# Patient Record
Sex: Female | Born: 1950 | Race: Asian | Hispanic: No | Smoking: Former smoker
Health system: Southern US, Community
[De-identification: ages and names within clinical notes are randomized; demographics above are authoritative.]

## PROBLEM LIST (undated history)

## (undated) DIAGNOSIS — I214 Non-ST elevation (NSTEMI) myocardial infarction: Secondary | ICD-10-CM

## (undated) DIAGNOSIS — R112 Nausea with vomiting, unspecified: Secondary | ICD-10-CM

## (undated) DIAGNOSIS — I701 Atherosclerosis of renal artery: Secondary | ICD-10-CM

## (undated) DIAGNOSIS — I739 Peripheral vascular disease, unspecified: Secondary | ICD-10-CM

## (undated) DIAGNOSIS — J3089 Other allergic rhinitis: Secondary | ICD-10-CM

## (undated) DIAGNOSIS — E119 Type 2 diabetes mellitus without complications: Secondary | ICD-10-CM

## (undated) DIAGNOSIS — I10 Essential (primary) hypertension: Secondary | ICD-10-CM

## (undated) DIAGNOSIS — E785 Hyperlipidemia, unspecified: Secondary | ICD-10-CM

## (undated) DIAGNOSIS — I5032 Chronic diastolic (congestive) heart failure: Secondary | ICD-10-CM

## (undated) DIAGNOSIS — D72829 Elevated white blood cell count, unspecified: Secondary | ICD-10-CM

## (undated) DIAGNOSIS — I499 Cardiac arrhythmia, unspecified: Secondary | ICD-10-CM

## (undated) DIAGNOSIS — R0602 Shortness of breath: Secondary | ICD-10-CM

## (undated) DIAGNOSIS — I2510 Atherosclerotic heart disease of native coronary artery without angina pectoris: Secondary | ICD-10-CM

## (undated) DIAGNOSIS — Z9889 Other specified postprocedural states: Secondary | ICD-10-CM

## (undated) HISTORY — PX: TUMOR REMOVAL: SHX12

## (undated) HISTORY — PX: CARDIAC CATHETERIZATION: SHX172

## (undated) HISTORY — PX: OTHER SURGICAL HISTORY: SHX169

---

## 1999-10-16 ENCOUNTER — Ambulatory Visit: Admission: RE | Admit: 1999-10-16 | Discharge: 1999-10-16 | Payer: Self-pay | Admitting: Gynecology

## 1999-10-16 ENCOUNTER — Other Ambulatory Visit: Admission: RE | Admit: 1999-10-16 | Discharge: 1999-10-16 | Payer: Self-pay | Admitting: Gynecology

## 1999-10-25 ENCOUNTER — Ambulatory Visit: Admission: RE | Admit: 1999-10-25 | Discharge: 1999-10-25 | Payer: Self-pay | Admitting: Gynecology

## 1999-11-19 ENCOUNTER — Ambulatory Visit: Admission: RE | Admit: 1999-11-19 | Discharge: 1999-11-19 | Payer: Self-pay | Admitting: Gynecology

## 1999-11-26 ENCOUNTER — Inpatient Hospital Stay (HOSPITAL_COMMUNITY): Admission: RE | Admit: 1999-11-26 | Discharge: 1999-11-27 | Payer: Self-pay | Admitting: Gynecology

## 1999-12-17 ENCOUNTER — Ambulatory Visit: Admission: RE | Admit: 1999-12-17 | Discharge: 1999-12-17 | Payer: Self-pay | Admitting: Gynecology

## 2000-01-07 ENCOUNTER — Encounter: Payer: Self-pay | Admitting: Gynecology

## 2000-01-07 ENCOUNTER — Ambulatory Visit: Admission: RE | Admit: 2000-01-07 | Discharge: 2000-01-07 | Payer: Self-pay | Admitting: Gynecology

## 2001-01-06 ENCOUNTER — Other Ambulatory Visit: Admission: RE | Admit: 2001-01-06 | Discharge: 2001-01-06 | Payer: Self-pay | Admitting: Family Medicine

## 2001-06-09 ENCOUNTER — Ambulatory Visit (HOSPITAL_COMMUNITY): Admission: RE | Admit: 2001-06-09 | Discharge: 2001-06-09 | Payer: Self-pay | Admitting: Family Medicine

## 2001-06-09 ENCOUNTER — Encounter: Payer: Self-pay | Admitting: Family Medicine

## 2003-12-12 ENCOUNTER — Other Ambulatory Visit: Admission: RE | Admit: 2003-12-12 | Discharge: 2003-12-12 | Payer: Self-pay | Admitting: Family Medicine

## 2004-03-17 ENCOUNTER — Emergency Department (HOSPITAL_COMMUNITY): Admission: EM | Admit: 2004-03-17 | Discharge: 2004-03-17 | Payer: Self-pay | Admitting: Emergency Medicine

## 2005-10-17 ENCOUNTER — Ambulatory Visit (HOSPITAL_COMMUNITY): Admission: RE | Admit: 2005-10-17 | Discharge: 2005-10-17 | Payer: Self-pay | Admitting: Internal Medicine

## 2005-11-12 ENCOUNTER — Ambulatory Visit (HOSPITAL_COMMUNITY): Admission: RE | Admit: 2005-11-12 | Discharge: 2005-11-12 | Payer: Self-pay | Admitting: Internal Medicine

## 2005-11-14 ENCOUNTER — Ambulatory Visit (HOSPITAL_COMMUNITY): Admission: RE | Admit: 2005-11-14 | Discharge: 2005-11-14 | Payer: Self-pay | Admitting: Internal Medicine

## 2005-12-22 ENCOUNTER — Ambulatory Visit: Payer: Self-pay

## 2006-01-13 ENCOUNTER — Ambulatory Visit: Payer: Self-pay | Admitting: Cardiology

## 2006-06-17 ENCOUNTER — Ambulatory Visit (HOSPITAL_COMMUNITY): Admission: RE | Admit: 2006-06-17 | Discharge: 2006-06-17 | Payer: Self-pay | Admitting: Internal Medicine

## 2011-05-12 DIAGNOSIS — R079 Chest pain, unspecified: Secondary | ICD-10-CM

## 2011-05-12 DIAGNOSIS — R0602 Shortness of breath: Secondary | ICD-10-CM

## 2012-02-19 ENCOUNTER — Encounter: Payer: Self-pay | Admitting: Internal Medicine

## 2012-03-29 ENCOUNTER — Encounter: Payer: 59 | Admitting: Internal Medicine

## 2012-04-20 DIAGNOSIS — I509 Heart failure, unspecified: Secondary | ICD-10-CM

## 2012-04-20 DIAGNOSIS — I5031 Acute diastolic (congestive) heart failure: Secondary | ICD-10-CM

## 2012-04-20 DIAGNOSIS — I10 Essential (primary) hypertension: Secondary | ICD-10-CM

## 2012-04-20 DIAGNOSIS — J9600 Acute respiratory failure, unspecified whether with hypoxia or hypercapnia: Secondary | ICD-10-CM

## 2012-04-22 ENCOUNTER — Other Ambulatory Visit: Payer: Self-pay | Admitting: Physician Assistant

## 2012-04-22 ENCOUNTER — Encounter (HOSPITAL_COMMUNITY): Payer: Self-pay | Admitting: General Practice

## 2012-04-22 ENCOUNTER — Other Ambulatory Visit: Payer: Self-pay

## 2012-04-22 ENCOUNTER — Inpatient Hospital Stay (HOSPITAL_COMMUNITY)
Admission: AD | Admit: 2012-04-22 | Discharge: 2012-04-24 | DRG: 246 | Disposition: A | Discharge status: Home / Self Care | Payer: 59 | Source: Other Acute Inpatient Hospital | Attending: Cardiovascular Disease | Admitting: Cardiovascular Disease

## 2012-04-22 DIAGNOSIS — I252 Old myocardial infarction: Secondary | ICD-10-CM

## 2012-04-22 DIAGNOSIS — I5031 Acute diastolic (congestive) heart failure: Secondary | ICD-10-CM

## 2012-04-22 DIAGNOSIS — Z87891 Personal history of nicotine dependence: Secondary | ICD-10-CM

## 2012-04-22 DIAGNOSIS — I2510 Atherosclerotic heart disease of native coronary artery without angina pectoris: Secondary | ICD-10-CM | POA: Diagnosis present

## 2012-04-22 DIAGNOSIS — I214 Non-ST elevation (NSTEMI) myocardial infarction: Principal | ICD-10-CM

## 2012-04-22 DIAGNOSIS — I509 Heart failure, unspecified: Secondary | ICD-10-CM

## 2012-04-22 DIAGNOSIS — D72829 Elevated white blood cell count, unspecified: Secondary | ICD-10-CM | POA: Diagnosis present

## 2012-04-22 DIAGNOSIS — I739 Peripheral vascular disease, unspecified: Secondary | ICD-10-CM | POA: Diagnosis present

## 2012-04-22 DIAGNOSIS — R0989 Other specified symptoms and signs involving the circulatory and respiratory systems: Secondary | ICD-10-CM | POA: Diagnosis present

## 2012-04-22 DIAGNOSIS — Z794 Long term (current) use of insulin: Secondary | ICD-10-CM

## 2012-04-22 DIAGNOSIS — E1151 Type 2 diabetes mellitus with diabetic peripheral angiopathy without gangrene: Secondary | ICD-10-CM | POA: Diagnosis present

## 2012-04-22 DIAGNOSIS — E119 Type 2 diabetes mellitus without complications: Secondary | ICD-10-CM | POA: Diagnosis present

## 2012-04-22 DIAGNOSIS — I10 Essential (primary) hypertension: Secondary | ICD-10-CM | POA: Diagnosis present

## 2012-04-22 DIAGNOSIS — E1169 Type 2 diabetes mellitus with other specified complication: Secondary | ICD-10-CM | POA: Diagnosis present

## 2012-04-22 DIAGNOSIS — Z6838 Body mass index (BMI) 38.0-38.9, adult: Secondary | ICD-10-CM

## 2012-04-22 DIAGNOSIS — I152 Hypertension secondary to endocrine disorders: Secondary | ICD-10-CM | POA: Diagnosis present

## 2012-04-22 DIAGNOSIS — J9600 Acute respiratory failure, unspecified whether with hypoxia or hypercapnia: Secondary | ICD-10-CM | POA: Diagnosis present

## 2012-04-22 DIAGNOSIS — E782 Mixed hyperlipidemia: Secondary | ICD-10-CM | POA: Diagnosis present

## 2012-04-22 DIAGNOSIS — E785 Hyperlipidemia, unspecified: Secondary | ICD-10-CM | POA: Diagnosis present

## 2012-04-22 DIAGNOSIS — Z79899 Other long term (current) drug therapy: Secondary | ICD-10-CM

## 2012-04-22 HISTORY — DX: Hyperlipidemia, unspecified: E78.5

## 2012-04-22 HISTORY — DX: Type 2 diabetes mellitus without complications: E11.9

## 2012-04-22 HISTORY — DX: Elevated white blood cell count, unspecified: D72.829

## 2012-04-22 HISTORY — DX: Atherosclerotic heart disease of native coronary artery without angina pectoris: I25.10

## 2012-04-22 HISTORY — DX: Morbid (severe) obesity due to excess calories: E66.01

## 2012-04-22 HISTORY — DX: Shortness of breath: R06.02

## 2012-04-22 HISTORY — DX: Essential (primary) hypertension: I10

## 2012-04-22 HISTORY — DX: Chronic diastolic (congestive) heart failure: I50.32

## 2012-04-22 HISTORY — DX: Peripheral vascular disease, unspecified: I73.9

## 2012-04-22 LAB — CBC
MCH: 31.1 pg (ref 26.0–34.0)
MCHC: 34.2 g/dL (ref 30.0–36.0)
Platelets: 316 10*3/uL (ref 150–400)
RBC: 4.56 MIL/uL (ref 3.87–5.11)
RDW: 14.8 % (ref 11.5–15.5)

## 2012-04-22 LAB — CREATININE, SERUM: Creatinine, Ser: 0.6 mg/dL (ref 0.50–1.10)

## 2012-04-22 MED ORDER — NITROGLYCERIN 0.4 MG SL SUBL: 0.4000 mg | SUBLINGUAL_TABLET | SUBLINGUAL | Status: DC | PRN

## 2012-04-22 MED ORDER — ATORVASTATIN CALCIUM 40 MG PO TABS
40.0000 mg | ORAL_TABLET | Freq: Every day | ORAL | Status: DC
  Administered 2012-04-22 – 2012-04-23 (×2): 40 mg via ORAL
  Filled 2012-04-22 (×3): qty 1

## 2012-04-22 MED ORDER — FUROSEMIDE 10 MG/ML IJ SOLN
40.0000 mg | Freq: Every day | INTRAMUSCULAR | Status: DC
  Administered 2012-04-23: 40 mg via INTRAVENOUS
  Filled 2012-04-22 (×2): qty 4

## 2012-04-22 MED ORDER — SODIUM CHLORIDE 0.9 % IV SOLN: 250.0000 mL | INTRAVENOUS | Status: DC | PRN

## 2012-04-22 MED ORDER — SODIUM CHLORIDE 0.9 % IJ SOLN
3.0000 mL | Freq: Two times a day (BID) | INTRAMUSCULAR | Status: DC
  Administered 2012-04-22 – 2012-04-23 (×3): 3 mL via INTRAVENOUS

## 2012-04-22 MED ORDER — SODIUM CHLORIDE 0.9 % IJ SOLN
3.0000 mL | Freq: Two times a day (BID) | INTRAMUSCULAR | Status: DC
  Administered 2012-04-22: 3 mL via INTRAVENOUS

## 2012-04-22 MED ORDER — ASPIRIN EC 81 MG PO TBEC
81.0000 mg | DELAYED_RELEASE_TABLET | Freq: Every day | ORAL | Status: DC
  Administered 2012-04-23: 81 mg via ORAL
  Filled 2012-04-22: qty 1

## 2012-04-22 MED ORDER — MONTELUKAST SODIUM 10 MG PO TABS
10.0000 mg | ORAL_TABLET | Freq: Every day | ORAL | Status: DC
  Administered 2012-04-22: 10 mg via ORAL
  Filled 2012-04-22 (×3): qty 1

## 2012-04-22 MED ORDER — ONDANSETRON HCL 4 MG/2ML IJ SOLN: 4.0000 mg | Freq: Four times a day (QID) | INTRAMUSCULAR | Status: DC | PRN

## 2012-04-22 MED ORDER — INSULIN ASPART 100 UNIT/ML ~~LOC~~ SOLN
0.0000 [IU] | Freq: Three times a day (TID) | SUBCUTANEOUS | Status: DC
  Administered 2012-04-23: 8 [IU] via SUBCUTANEOUS
  Administered 2012-04-23: 2 [IU] via SUBCUTANEOUS
  Administered 2012-04-24: 03:00:00 3 [IU] via SUBCUTANEOUS

## 2012-04-22 MED ORDER — SODIUM CHLORIDE 0.9 % IJ SOLN
3.0000 mL | INTRAMUSCULAR | Status: DC | PRN
  Administered 2012-04-23: 3 mL via INTRAVENOUS

## 2012-04-22 MED ORDER — SODIUM CHLORIDE 0.9 % IJ SOLN: 3.0000 mL | INTRAMUSCULAR | Status: DC | PRN

## 2012-04-22 MED ORDER — METOPROLOL TARTRATE 50 MG PO TABS
50.0000 mg | ORAL_TABLET | Freq: Two times a day (BID) | ORAL | Status: DC
  Administered 2012-04-22 – 2012-04-24 (×4): 50 mg via ORAL
  Filled 2012-04-22 (×5): qty 1

## 2012-04-22 MED ORDER — LISINOPRIL 20 MG PO TABS
20.0000 mg | ORAL_TABLET | Freq: Two times a day (BID) | ORAL | Status: DC
  Administered 2012-04-22 – 2012-04-24 (×4): 20 mg via ORAL
  Filled 2012-04-22 (×5): qty 1

## 2012-04-22 MED ORDER — SODIUM CHLORIDE 0.9 % IV SOLN
INTRAVENOUS | Status: DC
  Administered 2012-04-23 (×2): via INTRAVENOUS

## 2012-04-22 MED ORDER — ASPIRIN 81 MG PO CHEW
324.0000 mg | CHEWABLE_TABLET | ORAL | Status: AC
  Administered 2012-04-23: 324 mg via ORAL
  Filled 2012-04-22: qty 4

## 2012-04-22 MED ORDER — LOSARTAN POTASSIUM 50 MG PO TABS
100.0000 mg | ORAL_TABLET | Freq: Every day | ORAL | Status: DC
  Administered 2012-04-22 – 2012-04-24 (×3): 100 mg via ORAL
  Filled 2012-04-22 (×3): qty 2

## 2012-04-22 MED ORDER — INSULIN ASPART 100 UNIT/ML ~~LOC~~ SOLN
0.0000 [IU] | Freq: Every day | SUBCUTANEOUS | Status: DC
  Administered 2012-04-22: 2 [IU] via SUBCUTANEOUS

## 2012-04-22 MED ORDER — INSULIN GLARGINE 100 UNIT/ML ~~LOC~~ SOLN
70.0000 [IU] | Freq: Two times a day (BID) | SUBCUTANEOUS | Status: DC
  Administered 2012-04-22 – 2012-04-24 (×4): 70 [IU] via SUBCUTANEOUS

## 2012-04-22 MED ORDER — ACETAMINOPHEN 325 MG PO TABS: 650.0000 mg | ORAL_TABLET | ORAL | Status: DC | PRN

## 2012-04-22 MED ORDER — ENOXAPARIN SODIUM 40 MG/0.4ML ~~LOC~~ SOLN
40.0000 mg | SUBCUTANEOUS | Status: DC
  Administered 2012-04-22: 40 mg via SUBCUTANEOUS
  Filled 2012-04-22 (×2): qty 0.4

## 2012-04-22 NOTE — Progress Notes (Signed)
Hypoglycemic Event  CBG: 65  Treatment: 15 GM carbohydrate snack  Symptoms: None  Follow-up CBG: Time:1629 CBG Result:1333  Possible Reasons for Event: Inadequate meal intake  Comments/MD notified:    Adline Mango  Remember to initiate Hypoglycemia Order Set & complete

## 2012-04-22 NOTE — H&P (Signed)
[facility]                                          [place], [place]  [address]     NAMELEIANNE, Leah Ferrell [facility] MEGAT            ROOM:          [id number]                UNIT NUMBER:  [id number]                        LOCATION:      31F         ADM/VISIT DATE:     04/20/12                ADM PHYS:      Timmie Foerster MD    ACCT: [id number]                               DOB:           05/29/2050   CARDIOLOGY CONSULTATION REPORT   CONSULTING CARDIOLOGIST:  Dr. Nona Dell.   REFERRING PHYSICIAN:  Dr. Florene Glen, [facility].   REASON FOR CONSULTATION:  Congestive heart failure, abnormal troponins.   HISTORY OF PRESENT ILLNESS:  [name] is a 62 year old female, with no documented history of coronary artery disease, but with multiple cardiac risk factors notable for IDDM, HTN, HLD, and age.  She underwent previous noninvasive evaluation with echocardiography and pharmacologic nuclear imaging study in 04/2011.  Echocardiogram indicated normal LVF (EF 50-55%), with no focal WMAs, normal RVF, and no significant valvular abnormalities.   The [facility], also reviewed by Dr. Andee Lineman, indicated normal perfusion with normal wall motion.   The patient presented to the emergency room early this morning, via EMS, with complaint of shortness of breath.  She presented with a blood pressure of 189/106, pulse 101, and was afebrile.  She was found to have evidence of mild pulmonary edema on chest x-ray and was treated with 40 mg of IV Lasix in the ED. Additionally, she was treated with nebulizer and antibiotics.   Admission EKG indicated normal sinus rhythm with no acute changes.  Initial set of cardiac markers is nondiagnostic with a troponin I of 0.06.  Followup levels are currently pending.   The patient presents with no antecedent history of exertional angina pectoris;  however, she does complain of exertional dyspnea.  She also suggests  a longstanding history of occasional palpitations, including an episode for which she was evaluated at [facility] ED, approximately 10 years ago. She suggests that she did not require hospitalization and remains unclear as to exactly what was her diagnosis.   ALLERGIES:  No known drug allergies.   HOME MEDICATIONS: 1.   Coated aspirin 325 mg daily. 2.   Caltrate 600 b.i.d. 3.   Cozaar 50 daily. 4.   Fish oil 1 gm daily. 5.   Lisinopril/HCTZ 20/12.5 b.i.d. 6.   Lopressor 50 b.i.d. 7.   Humulin 98 units t.i.d. before meals. 8.   Pravastatin 80 daily. 9.   Singulair 10 daily.   PAST MEDICAL HISTORY: 1.   HTN. 2.   Atrial D. 3.   IDDM. 4.   Palpitations. 5.   Morbid  obesity.   SOCIAL HISTORY:  The patient lives in [place], alone.  She has 4 children.  She is widowed.  She quit smoking tobacco 10 years ago.  Denies alcohol use.   FAMILY HISTORY:  Negative for premature CAD.   REVIEW OF SYSTEMS:  Denies history of MI, CHF, or stroke.  Otherwise as per HPI.  Remaining systems reviewed and are negative.   PHYSICAL EXAMINATION:  Blood pressure currently 140/74, pulse 90's regular, respirations 22, temperature afebrile, saturations 97% on 3 liters.  Weight 209 pounds.  General:  A 62 year old female, morbidly obese, sitting upright, in no distress.  HEENT:  Normocephalic, atraumatic.  PERRLA.  EOMI.  Neck:  Palpable bilateral carotid pulses with left-sided bruit/supraclavicular bruit.  Unable to discern JVD, secondary to neck girth.  Lungs: Clear to auscultation bilaterally.  Heart:  Regular rate and rhythm with a soft grade 2/6 systolic ejection murmur radiating to the aortic region.  No diastolic blow.  No rubs.  Abdomen:  Protuberant, soft.  Extremities:  Palpable bilateral femoral pulses, bilateral bruits (high pitched on left).  No significant peripheral edema.  Nonpalpable peripheral pulses.  Skin:  Warm and dry.  Musculoskeletal:  No obvious deformity.  Neuro:   Alert and oriented.   ADMISSION CHEST X-RAY:  Borderline cardiomegaly; vascular congestion consistent with mild pulmonary edema.   ADMISSION EKG:  NSR, 96 bpm; nonspecific ST changes.   LABORATORY DATA:  A pH 7.38, pCO2 49, pCO2 48 and bicarb 29 (room air).  D-dimer 0.48, BNP 36.  CPK 146/3.4; troponin I 0.06 and 0.12, third set pending.  Sodium 137, potassium 3.5, BUN 13, creatinine 0.7, glucose 278.  WBC 12,500, hemoglobin 13.9, hematocrit 41, and platelets 270,000.   IMPRESSION: 1.   Status post hypoxic respiratory failure. 2.   Acute diastolic heart failure, mild. A.  Normal Lexiscan Cardiolite, 04/2011. B.  Normal left ventricular function, echocardiogram, 04/2011. 3.   Hypertensive urgency. 4.   Insulin-dependent diabetes mellitus. 5.   Hypertension. 6.   Atrial D. 7.   Peripheral arterial disease, presumed. A.  Bilateral femoral bruits. 8.   Leukocytosis, afebrile. A.  Chronically elevated.   PLAN: 1.   Will follow up on echocardiogram for reassessment of LV function, rule out      underlying structural abnormalities.  If the study indicates a new wall      motion abnormality, or a decrease in EF, then we would strongly recommend      more aggressive evaluation with cardiac catheterization.  Otherwise, the      patient will require repeat stress test for risk stratification, given her      presentation with new onset heart failure and mildly abnormal troponins. 2.   Continue current diuretic regimen with close monitoring of volume status      with strict I/Os and daily weights.  Followup labs in a.m. for close      monitoring of renal function. 3.   Will assess lipid status with FLP profile in a.m. 4.   Continue cycling cardiac markers.  Followup EKG in a.m. 5.   Will order carotid Dopplers and lower extremity arterial Dopplers to rule      out significant carotid and peripheral arterial disease. 6.   We will also initiate intravenous heparin anticoagulation, per  pharmacy      protocol, given her presentation with abnormal troponins.   Patient was seen and examined in conjunction with Dr Nona Dell.  ______________________________                                             Prescott Parma, PA-C                                                                                                      ______________________________                                             Nona Dell, MD, [facility]   Attending note:  Patient seen and examined initially with Mr. Shara Blazing, history outlined above. Followup noninvasive testing reveals mild carotid atherosclerosis, markedly diminished ABIs bilaterally consistent with severe PAD (0.49 on the right and 0.39 the left), LVEF greater than 65% with diastolic dysfunction, moderate LAE with mild mitral regurgitation, atherosclerosis noted within the proximal ascending aorta. Cardiac markers minimally abnormal with peak troponin I 0.12, still concern for at least a type II NSTEMI in the setting of acute diastolic heart failure and hypertension. She has improved clinically with diuresis and medical therapy. After discussion, plan at this point is transfer to [facility] in anticipation of a diagnostic cardiac catheterization as well as peripheral angiography. Despite her normal Myoview from last March, she has clear evidence of diffuse atherosclerosis, and coronary anatomy needs to be clarified in light of her recent clinical decline. Risks and benefits discussed with her. She is in agreement to proceed.  Jonelle Sidle, M.D., F.A.C.C.

## 2012-04-23 ENCOUNTER — Other Ambulatory Visit: Payer: Self-pay

## 2012-04-23 ENCOUNTER — Encounter (HOSPITAL_COMMUNITY)
Admission: AD | Disposition: A | Discharge status: Home / Self Care | Payer: Self-pay | Source: Other Acute Inpatient Hospital | Attending: Cardiovascular Disease

## 2012-04-23 ENCOUNTER — Ambulatory Visit (HOSPITAL_COMMUNITY): Admission: RE | Admit: 2012-04-23 | Payer: 59 | Source: Ambulatory Visit | Admitting: Cardiology

## 2012-04-23 ENCOUNTER — Encounter (HOSPITAL_COMMUNITY): Payer: Self-pay | Admitting: Nurse Practitioner

## 2012-04-23 DIAGNOSIS — E1151 Type 2 diabetes mellitus with diabetic peripheral angiopathy without gangrene: Secondary | ICD-10-CM | POA: Diagnosis present

## 2012-04-23 DIAGNOSIS — I152 Hypertension secondary to endocrine disorders: Secondary | ICD-10-CM | POA: Diagnosis present

## 2012-04-23 DIAGNOSIS — E1159 Type 2 diabetes mellitus with other circulatory complications: Secondary | ICD-10-CM | POA: Diagnosis present

## 2012-04-23 DIAGNOSIS — I10 Essential (primary) hypertension: Secondary | ICD-10-CM | POA: Diagnosis present

## 2012-04-23 DIAGNOSIS — I5031 Acute diastolic (congestive) heart failure: Secondary | ICD-10-CM

## 2012-04-23 DIAGNOSIS — E782 Mixed hyperlipidemia: Secondary | ICD-10-CM | POA: Diagnosis present

## 2012-04-23 DIAGNOSIS — I2510 Atherosclerotic heart disease of native coronary artery without angina pectoris: Secondary | ICD-10-CM

## 2012-04-23 DIAGNOSIS — E785 Hyperlipidemia, unspecified: Secondary | ICD-10-CM | POA: Diagnosis present

## 2012-04-23 DIAGNOSIS — E1169 Type 2 diabetes mellitus with other specified complication: Secondary | ICD-10-CM | POA: Diagnosis present

## 2012-04-23 DIAGNOSIS — I252 Old myocardial infarction: Secondary | ICD-10-CM

## 2012-04-23 HISTORY — PX: LEFT HEART CATHETERIZATION WITH CORONARY ANGIOGRAM: SHX5451

## 2012-04-23 LAB — URINALYSIS, ROUTINE W REFLEX MICROSCOPIC
Glucose, UA: NEGATIVE mg/dL
Hgb urine dipstick: NEGATIVE
Specific Gravity, Urine: 1.008 (ref 1.005–1.030)
Urobilinogen, UA: 0.2 mg/dL (ref 0.0–1.0)
pH: 7 (ref 5.0–8.0)

## 2012-04-23 LAB — BASIC METABOLIC PANEL
BUN: 14 mg/dL (ref 6–23)
Chloride: 98 mEq/L (ref 96–112)
Glucose, Bld: 196 mg/dL — ABNORMAL HIGH (ref 70–99)
Potassium: 3.7 mEq/L (ref 3.5–5.1)

## 2012-04-23 LAB — GLUCOSE, CAPILLARY
Glucose-Capillary: 169 mg/dL — ABNORMAL HIGH (ref 70–99)
Glucose-Capillary: 232 mg/dL — ABNORMAL HIGH (ref 70–99)

## 2012-04-23 SURGERY — LEFT HEART CATHETERIZATION WITH CORONARY ANGIOGRAM
Anesthesia: LOCAL

## 2012-04-23 MED ORDER — LIDOCAINE HCL (PF) 1 % IJ SOLN
INTRAMUSCULAR | Status: AC
  Filled 2012-04-23: qty 30

## 2012-04-23 MED ORDER — AMLODIPINE BESYLATE 5 MG PO TABS
5.0000 mg | ORAL_TABLET | Freq: Every day | ORAL | Status: DC
  Administered 2012-04-23 – 2012-04-24 (×2): 5 mg via ORAL
  Filled 2012-04-23 (×3): qty 1

## 2012-04-23 MED ORDER — FENTANYL CITRATE 0.05 MG/ML IJ SOLN
INTRAMUSCULAR | Status: AC
  Filled 2012-04-23: qty 2

## 2012-04-23 MED ORDER — TICAGRELOR 90 MG PO TABS
ORAL_TABLET | ORAL | Status: AC
  Filled 2012-04-23: qty 2

## 2012-04-23 MED ORDER — TICAGRELOR 90 MG PO TABS
90.0000 mg | ORAL_TABLET | Freq: Two times a day (BID) | ORAL | Status: DC
  Administered 2012-04-23 – 2012-04-24 (×2): 90 mg via ORAL
  Filled 2012-04-23 (×4): qty 1

## 2012-04-23 MED ORDER — BIVALIRUDIN 250 MG IV SOLR
INTRAVENOUS | Status: AC
  Filled 2012-04-23: qty 250

## 2012-04-23 MED ORDER — HEPARIN SODIUM (PORCINE) 1000 UNIT/ML IJ SOLN
INTRAMUSCULAR | Status: AC
  Filled 2012-04-23: qty 1

## 2012-04-23 MED ORDER — SODIUM CHLORIDE 0.9 % IV SOLN
1.0000 mL/kg/h | INTRAVENOUS | Status: AC
  Administered 2012-04-23: 14:00:00 1 mL/kg/h via INTRAVENOUS

## 2012-04-23 MED ORDER — ASPIRIN 81 MG PO CHEW
81.0000 mg | CHEWABLE_TABLET | Freq: Every day | ORAL | Status: DC
  Administered 2012-04-24: 09:00:00 81 mg via ORAL
  Filled 2012-04-23: qty 1

## 2012-04-23 MED ORDER — FUROSEMIDE 40 MG PO TABS
40.0000 mg | ORAL_TABLET | Freq: Every day | ORAL | Status: DC
  Filled 2012-04-23 (×2): qty 1

## 2012-04-23 MED ORDER — VERAPAMIL HCL 2.5 MG/ML IV SOLN
INTRAVENOUS | Status: AC
  Filled 2012-04-23: qty 2

## 2012-04-23 MED ORDER — HEPARIN (PORCINE) IN NACL 2-0.9 UNIT/ML-% IJ SOLN
INTRAMUSCULAR | Status: AC
  Filled 2012-04-23: qty 1000

## 2012-04-23 MED ORDER — MIDAZOLAM HCL 2 MG/2ML IJ SOLN
INTRAMUSCULAR | Status: AC
  Filled 2012-04-23: qty 2

## 2012-04-23 NOTE — Progress Notes (Signed)
Pt visibly upset after talking to daughter on phone.  States "don't feel well", describes dizzy or fuzzy feeling in head. Tele SR 90's.  Moves all extremities strong and equal, pupils pearl, speech clear and appropriate.  Back to bed, po BP meds lisinopril and metoprolol given early.  Emotional support given.  Will monitor closely, call light in reach, bed exit alarm on.

## 2012-04-23 NOTE — H&P (View-Only) (Signed)
Patient Name: Leah Ferrell Date of Encounter: 04/23/2012   Principal Problem:   Acute diastolic CHF (congestive heart failure) Active Problems:   NSTEMI (non-ST elevated myocardial infarction)   Hypertension   Diabetes mellitus without complication   Hyperlipidemia   SUBJECTIVE  Slept well.  No chest pain or sob.  Able to lie flat.  CURRENT MEDS . aspirin EC  81 mg Oral Daily  . atorvastatin  40 mg Oral q1800  . enoxaparin (LOVENOX) injection  40 mg Subcutaneous Q24H  . furosemide  40 mg Intravenous Daily  . insulin aspart  0-15 Units Subcutaneous TID WC  . insulin aspart  0-5 Units Subcutaneous QHS  . insulin glargine  70 Units Subcutaneous BID  . lisinopril  20 mg Oral BID  . losartan  100 mg Oral Daily  . metoprolol tartrate  50 mg Oral BID  . montelukast  10 mg Oral QHS  . sodium chloride  3 mL Intravenous Q12H  . sodium chloride  3 mL Intravenous Q12H   OBJECTIVE  Filed Vitals:   04/22/12 1506 04/22/12 1828 04/22/12 2216 04/23/12 0551  BP: 170/80 150/52 173/76 147/52  Pulse: 75 83 86 79  Temp: 99.4 F (37.4 C)   97.9 F (36.6 C)  TempSrc: Oral   Oral  Resp: 18   18  Height: 5\' 2"  (1.575 m)     Weight: 210 lb 1.6 oz (95.3 kg)   212 lb 11.9 oz (96.5 kg)  SpO2: 96%   98%    Intake/Output Summary (Last 24 hours) at 04/23/12 0802 Last data filed at 04/23/12 0700  Gross per 24 hour  Intake  486.5 ml  Output      0 ml  Net  486.5 ml   Filed Weights   04/22/12 1506 04/23/12 0551  Weight: 210 lb 1.6 oz (95.3 kg) 212 lb 11.9 oz (96.5 kg)   PHYSICAL EXAM  General: Pleasant, NAD. Neuro: Alert and oriented X 3. Moves all extremities spontaneously. Psych: Normal affect. HEENT:  Normal  Neck: Supple without bruits.  JVP difficult to assess 2/2 girth. Lungs:  Resp regular and unlabored, CTA. Heart: RRR no s3, s4, or murmurs. Abdomen: Soft, non-tender, non-distended, BS + x 4.  Extremities: No clubbing, cyanosis.  Trace bilat LE edema. DP/PT/Radials 1+ and  equal bilaterally.  Accessory Clinical Findings  CBC  Recent Labs  04/22/12 1615  WBC 17.2*  HGB 14.2  HCT 41.5  MCV 91.0  PLT 316   Basic Metabolic Panel  Recent Labs  04/22/12 1615 04/23/12 0620  NA  --  137  K  --  3.7  CL  --  98  CO2  --  30  GLUCOSE  --  196*  BUN  --  14  CREATININE 0.60 0.59  CALCIUM  --  9.6   TELE  rsr  ECG  Rsr, 70, no acute st/t changes.  Radiology/Studies  No results found.  ASSESSMENT AND PLAN  1.  Acute diastolic chf:  Echo @ [facility] showed EF > 65%.  Weight @ [facility] yesterday was 215.  212 on our scale this AM with net + I/O yesterday.  She feels well and is able to lie flat.  Lungs are clear.  BP remains poorly controlled.  Slow IVF (contrast prophylaxix), add amlodipine, Cont current doses of bb/acei/arb.  K and Creat stable despite use of both acei and arb.  Cont current dose of IV lasix with plan to eval filling pressures at time of cath.  2.  Type II NSTEMI:  Mild troponin leak in setting of #1.  Cath today.  3.  PVD:  ABI @ [facility] showed - R: 0.49, L 0.39.  For peripheral runoff today.  4.  Leukocytosis:  According to cardiology consult note on 3/4, this is somewhat chronic, though higher today than it had been.  She did have a low grade fever yesterday afternoon.  Afebrile this AM.  Check UA.  Lungs clear.  5. HTN:  As above, poorly controlled.  Add amlodipine.  6.  DM:  Cont lantus and ssi.  Signed, Nicolasa Ducking NP Patient seen and examined and history reviewed. Agree with above findings and plan. Patient at high risk for CAD. Will proceed with cardiac cath today +/- PCI. Once cleared from cardiac standpoint will need PV evaluation. The procedure and risks were reviewed including but not limited to death, myocardial infarction, stroke, arrythmias, bleeding, transfusion, emergency surgery, dye allergy, or renal dysfunction. The patient voices understanding and is agreeable to proceed.   Theron Arista [facility] 04/23/2012  9:33 AM

## 2012-04-23 NOTE — Progress Notes (Signed)
Pt resting quietly with eyes closed, awakens easily, denies complaints.  BP 166/50.

## 2012-04-23 NOTE — Progress Notes (Signed)
TR BAND REMOVAL  LOCATION:  right radial  DEFLATED PER PROTOCOL:  yes  TIME BAND OFF / DRESSING APPLIED:   1630   SITE UPON ARRIVAL:   Level 0  SITE AFTER BAND REMOVAL:  Level 0  REVERSE ALLEN'S TEST:    positive  CIRCULATION SENSATION AND MOVEMENT:  Within Normal Limits  yes  COMMENTS:    

## 2012-04-23 NOTE — Progress Notes (Signed)
Utilization Review Completed Camellia J. Wood, RN, BSN, NCM [phone]  

## 2012-04-23 NOTE — Interval H&P Note (Signed)
History and Physical Interval Note:  04/23/2012 11:27 AM  Leah Ferrell  has presented today for surgery, with the diagnosis of cp  The various methods of treatment have been discussed with the patient and family. After consideration of risks, benefits and other options for treatment, the patient has consented to  Procedure(s): LEFT HEART CATHETERIZATION WITH CORONARY ANGIOGRAM (N/A) as a surgical intervention .  The patient's history has been reviewed, patient examined, no change in status, stable for surgery.  I have reviewed the patient's chart and labs.  Questions were answered to the patient's satisfaction.     Leah Ferrell 04/23/2012 11:27 AM

## 2012-04-23 NOTE — Progress Notes (Signed)
 Patient Name: Leah Ferrell Date of Encounter: 04/23/2012   Principal Problem:   Acute diastolic CHF (congestive heart failure) Active Problems:   NSTEMI (non-ST elevated myocardial infarction)   Hypertension   Diabetes mellitus without complication   Hyperlipidemia   SUBJECTIVE  Slept well.  No chest pain or sob.  Able to lie flat.  CURRENT MEDS . aspirin EC  81 mg Oral Daily  . atorvastatin  40 mg Oral q1800  . enoxaparin (LOVENOX) injection  40 mg Subcutaneous Q24H  . furosemide  40 mg Intravenous Daily  . insulin aspart  0-15 Units Subcutaneous TID WC  . insulin aspart  0-5 Units Subcutaneous QHS  . insulin glargine  70 Units Subcutaneous BID  . lisinopril  20 mg Oral BID  . losartan  100 mg Oral Daily  . metoprolol tartrate  50 mg Oral BID  . montelukast  10 mg Oral QHS  . sodium chloride  3 mL Intravenous Q12H  . sodium chloride  3 mL Intravenous Q12H   OBJECTIVE  Filed Vitals:   04/22/12 1506 04/22/12 1828 04/22/12 2216 04/23/12 0551  BP: 170/80 150/52 173/76 147/52  Pulse: 75 83 86 79  Temp: 99.4 F (37.4 C)   97.9 F (36.6 C)  TempSrc: Oral   Oral  Resp: 18   18  Height: 5' 2" (1.575 m)     Weight: 210 lb 1.6 oz (95.3 kg)   212 lb 11.9 oz (96.5 kg)  SpO2: 96%   98%    Intake/Output Summary (Last 24 hours) at 04/23/12 0802 Last data filed at 04/23/12 0700  Gross per 24 hour  Intake  486.5 ml  Output      0 ml  Net  486.5 ml   Filed Weights   04/22/12 1506 04/23/12 0551  Weight: 210 lb 1.6 oz (95.3 kg) 212 lb 11.9 oz (96.5 kg)   PHYSICAL EXAM  General: Pleasant, NAD. Neuro: Alert and oriented X 3. Moves all extremities spontaneously. Psych: Normal affect. HEENT:  Normal  Neck: Supple without bruits.  JVP difficult to assess 2/2 girth. Lungs:  Resp regular and unlabored, CTA. Heart: RRR no s3, s4, or murmurs. Abdomen: Soft, non-tender, non-distended, BS + x 4.  Extremities: No clubbing, cyanosis.  Trace bilat LE edema. DP/PT/Radials 1+ and  equal bilaterally.  Accessory Clinical Findings  CBC  Recent Labs  04/22/12 1615  WBC 17.2*  HGB 14.2  HCT 41.5  MCV 91.0  PLT 316   Basic Metabolic Panel  Recent Labs  04/22/12 1615 04/23/12 0620  NA  --  137  K  --  3.7  CL  --  98  CO2  --  30  GLUCOSE  --  196*  BUN  --  14  CREATININE 0.60 0.59  CALCIUM  --  9.6   TELE  rsr  ECG  Rsr, 70, no acute st/t changes.  Radiology/Studies  No results found.  ASSESSMENT AND PLAN  1.  Acute diastolic chf:  Echo @ [facility] showed EF > 65%.  Weight @ [facility] yesterday was 215.  212 on our scale this AM with net + I/O yesterday.  She feels well and is able to lie flat.  Lungs are clear.  BP remains poorly controlled.  Slow IVF (contrast prophylaxix), add amlodipine, Cont current doses of bb/acei/arb.  K and Creat stable despite use of both acei and arb.  Cont current dose of IV lasix with plan to eval filling pressures at time of cath.    2.  Type II NSTEMI:  Mild troponin leak in setting of #1.  Cath today.  3.  PVD:  ABI @ [facility] showed - R: 0.49, L 0.39.  For peripheral runoff today.  4.  Leukocytosis:  According to cardiology consult note on 3/4, this is somewhat chronic, though higher today than it had been.  She did have a low grade fever yesterday afternoon.  Afebrile this AM.  Check UA.  Lungs clear.  5. HTN:  As above, poorly controlled.  Add amlodipine.  6.  DM:  Cont lantus and ssi.  Signed, Christopher Berge NP Patient seen and examined and history reviewed. Agree with above findings and plan. Patient at high risk for CAD. Will proceed with cardiac cath today +/- PCI. Once cleared from cardiac standpoint will need PV evaluation. The procedure and risks were reviewed including but not limited to death, myocardial infarction, stroke, arrythmias, bleeding, transfusion, emergency surgery, dye allergy, or renal dysfunction. The patient voices understanding and is agreeable to proceed.   Peter JordanMD,FACC 04/23/2012  9:33 AM    

## 2012-04-23 NOTE — CV Procedure (Signed)
   Cardiac Catheterization Procedure Note  Name: Leah Ferrell MRN: [id number] DOB: [birth year]  Procedure: Left Heart Cath, Selective Coronary Angiography, LV angiography, PTCA and stenting of the LAD  Indication: 62 yo Female with multiple cardiac risk factors and PAD presents with unstable angina class IV.  Procedural Details:  The right wrist was prepped, draped, and anesthetized with 1% lidocaine. Using the modified Seldinger technique, a 5 French sheath was introduced into the right radial artery. 3 mg of verapamil was administered through the sheath, weight-based unfractionated heparin was administered intravenously. Standard Judkins catheters were used for selective coronary angiography and left ventriculography. Catheter exchanges were performed over an exchange length guidewire.  PROCEDURAL FINDINGS Hemodynamics: AO 155/58 mean 91 mm Hg LV 163/13 mm Hg   Coronary angiography: Coronary dominance: right  Left mainstem: Short with shared coronary ostia for the LAD and LCX.  Left anterior descending (LAD): diffuse 30-40% proximal. 90% focal lesion after the first diagonal.   Left circumflex (LCx): <20% proximal and mid. 80% very small distal OM.  Right coronary artery (RCA): Diffuse 20% proximal and mid vessel disease. Moderate calcification.  Left ventriculography: Left ventricular systolic function is normal, LVEF is estimated at 55-65%, there is no significant mitral regurgitation   PCI Note:  Following the diagnostic procedure, the decision was made to proceed with PCI. The radial sheath was upsized to a 6 [place]. Bilinta 180 mg was given orally. Weight-based bivalirudin was given for anticoagulation. Once a therapeutic ACT was achieved, a 6 [place] XB 3.0 guide catheter was inserted.  A prowater coronary guidewire was used to cross the lesion.  The lesion was predilated with a 2.5 mm balloon.  The lesion was then stented with a 3.0x 16 mm Promus premier stent.  The stent was  postdilated with a 3.25 mm noncompliant balloon.  Following PCI, there was 0% residual stenosis and TIMI-3 flow. Final angiography confirmed an excellent result. The patient tolerated the procedure well. There were no immediate procedural complications. A TR band was used for radial hemostasis. The patient was transferred to the post catheterization recovery area for further monitoring.  PCI Data: Vessel - LAD/Segment - mid Percent Stenosis (pre)  90% TIMI-flow 3 Stent 3.0 x 16 mm Promus premier Percent Stenosis (post) 0% TIMI-flow (post) 3  Final Conclusions:   1. Single vessel obstructive CAD 2. Normal LV function. 3. Successful stenting of the mid LAD with a DES.   Recommendations:  Dual antiplatelet therapy for one year.  Theron Arista [facility] 04/23/2012, 12:21 PM

## 2012-04-24 ENCOUNTER — Other Ambulatory Visit: Payer: Self-pay

## 2012-04-24 ENCOUNTER — Other Ambulatory Visit: Payer: Self-pay | Admitting: Physician Assistant

## 2012-04-24 ENCOUNTER — Encounter (HOSPITAL_COMMUNITY): Payer: Self-pay | Admitting: Physician Assistant

## 2012-04-24 DIAGNOSIS — I214 Non-ST elevation (NSTEMI) myocardial infarction: Secondary | ICD-10-CM

## 2012-04-24 DIAGNOSIS — I5031 Acute diastolic (congestive) heart failure: Secondary | ICD-10-CM

## 2012-04-24 DIAGNOSIS — I509 Heart failure, unspecified: Secondary | ICD-10-CM

## 2012-04-24 LAB — BASIC METABOLIC PANEL
BUN: 11 mg/dL (ref 6–23)
Chloride: 99 mEq/L (ref 96–112)
Creatinine, Ser: 0.6 mg/dL (ref 0.50–1.10)
Glucose, Bld: 195 mg/dL — ABNORMAL HIGH (ref 70–99)
Potassium: 3.5 mEq/L (ref 3.5–5.1)

## 2012-04-24 LAB — CBC
HCT: 39.8 % (ref 36.0–46.0)
Hemoglobin: 13.6 g/dL (ref 12.0–15.0)
MCHC: 34.2 g/dL (ref 30.0–36.0)
MCV: 91.9 fL (ref 78.0–100.0)
WBC: 14.2 10*3/uL — ABNORMAL HIGH (ref 4.0–10.5)

## 2012-04-24 LAB — GLUCOSE, CAPILLARY: Glucose-Capillary: 155 mg/dL — ABNORMAL HIGH (ref 70–99)

## 2012-04-24 MED ORDER — FUROSEMIDE 20 MG PO TABS: 20.0000 mg | ORAL_TABLET | Freq: Every day | ORAL | Status: DC

## 2012-04-24 MED ORDER — LOSARTAN POTASSIUM 100 MG PO TABS: 100.0000 mg | ORAL_TABLET | Freq: Every day | ORAL | Status: DC

## 2012-04-24 MED ORDER — LOPERAMIDE HCL 2 MG PO CAPS
4.0000 mg | ORAL_CAPSULE | ORAL | Status: DC | PRN
  Administered 2012-04-24: 05:00:00 4 mg via ORAL
  Filled 2012-04-24: qty 2

## 2012-04-24 MED ORDER — FUROSEMIDE 20 MG PO TABS
20.0000 mg | ORAL_TABLET | Freq: Every day | ORAL | Status: DC
  Administered 2012-04-24: 20 mg via ORAL
  Filled 2012-04-24: qty 1

## 2012-04-24 MED ORDER — LISINOPRIL 20 MG PO TABS: 20.0000 mg | ORAL_TABLET | Freq: Two times a day (BID) | ORAL | Status: DC

## 2012-04-24 MED ORDER — ATORVASTATIN CALCIUM 40 MG PO TABS: 40.0000 mg | ORAL_TABLET | Freq: Every day | ORAL | Status: DC

## 2012-04-24 MED ORDER — TICAGRELOR 90 MG PO TABS: 90.0000 mg | ORAL_TABLET | Freq: Two times a day (BID) | ORAL | Status: DC

## 2012-04-24 MED ORDER — AMLODIPINE BESYLATE 5 MG PO TABS: 5.0000 mg | ORAL_TABLET | Freq: Every day | ORAL | Status: DC

## 2012-04-24 MED ORDER — ASPIRIN 81 MG PO TBEC: 81.0000 mg | DELAYED_RELEASE_TABLET | Freq: Every day | ORAL | Status: DC

## 2012-04-24 MED ORDER — NITROGLYCERIN 0.4 MG SL SUBL: 0.4000 mg | SUBLINGUAL_TABLET | SUBLINGUAL | Status: DC | PRN

## 2012-04-24 NOTE — Progress Notes (Signed)
   Patient Name: Leah Ferrell Date of Encounter: 04/24/2012   Principal Problem:   Acute diastolic CHF (congestive heart failure) Active Problems:   Hypertension   Diabetes mellitus without complication   Hyperlipidemia   NSTEMI (non-ST elevated myocardial infarction)   SUBJECTIVE  No chest pain or sob.   CURRENT MEDS . amLODipine  5 mg Oral Daily  . aspirin  81 mg Oral Daily  . atorvastatin  40 mg Oral q1800  . furosemide  40 mg Oral Daily  . insulin aspart  0-15 Units Subcutaneous TID WC  . insulin aspart  0-5 Units Subcutaneous QHS  . insulin glargine  70 Units Subcutaneous BID  . lisinopril  20 mg Oral BID  . losartan  100 mg Oral Daily  . metoprolol tartrate  50 mg Oral BID  . montelukast  10 mg Oral QHS  . Ticagrelor  90 mg Oral BID   OBJECTIVE  Filed Vitals:   04/24/12 0400 04/24/12 0500 04/24/12 0509 04/24/12 0700  BP: 170/44 186/49 152/51 164/54  Pulse: 70   70  Temp: 97.6 F (36.4 C)   98.2 F (36.8 C)  TempSrc: Oral   Oral  Resp:      Height:      Weight: 213 lb 3 oz (96.7 kg)     SpO2: 96%   96%    Intake/Output Summary (Last 24 hours) at 04/24/12 0825 Last data filed at 04/24/12 0700  Gross per 24 hour  Intake 2301.03 ml  Output   2325 ml  Net -23.97 ml   Filed Weights   04/22/12 1506 04/23/12 0551 04/24/12 0400  Weight: 210 lb 1.6 oz (95.3 kg) 212 lb 11.9 oz (96.5 kg) 213 lb 3 oz (96.7 kg)   PHYSICAL EXAM  General: Pleasant, NAD. Neuro: Alert and oriented X 3. Moves all extremities spontaneously. HEENT:  Normal  Neck: Supple  Lungs:  Resp regular and unlabored, CTA. Heart: RRR no s3, s4, or murmurs. Abdomen: Soft, non-tender, non-distended Extremities: No edema. Cath site with no hematoma  Accessory Clinical Findings  CBC  Recent Labs  04/22/12 1615 04/24/12 0645  WBC 17.2* 14.2*  HGB 14.2 13.6  HCT 41.5 39.8  MCV 91.0 91.9  PLT 316 279   Basic Metabolic Panel  Recent Labs  04/23/12 0620 04/24/12 0645  NA 137 137    K 3.7 3.5  CL 98 99  CO2 30 31  GLUCOSE 196* 195*  BUN 14 11  CREATININE 0.59 0.60  CALCIUM 9.6 9.6     ECG  Rsr, 70, no acute st/t changes.    ASSESSMENT AND PLAN  1.  Acute diastolic chf:  Echo @ [facility] showed EF > 65%.  DC on lasix 20 mg po daily. BMET one week.  2.  Type II NSTEMI:  Mild troponin leak in setting of #1.  S/P PCI of LAD; continue ASA and plavix. Continue lipitor.  3.  PVD:  ABI @ [facility] showed - R: 0.49, L 0.39.  Arrange outpatient eval with Dr Kirke Corin.  4.  Leukocytosis:  According to cardiology consult note on 3/4, this is somewhat chronic.  Fu with her primary care as outpt.  5. HTN:  Continue present meds at DC and adjust as outpt.  6.  DM:  Cont lantus and ssi. > 30 min PA and physician time Maretta Los [facility] 04/24/2012 8:25 AM

## 2012-04-24 NOTE — Discharge Summary (Signed)
See progress notes Leah Ferrell  

## 2012-04-24 NOTE — Progress Notes (Signed)
Dozing quietly, awakens easily.  Pt reports 4 loose bm's in past 24 hrs.  Immodium given per cardiology prn orders.  BP 152/51.

## 2012-04-24 NOTE — Progress Notes (Signed)
CARDIAC REHAB PHASE I   PRE:  Rate/Rhythm: Sr 86  BP:  Supine:   Sitting: 203/51 No am bp meds yet Standing:    SaO2: RA 98  MODE:  Ambulation: 200 ft   POST:  Rate/Rhythm: Sr 90  BP:  Supine:   Sitting: 187/51  Standing:    SaO2: RA 97  Ambulated pt x 2 200 feet pt with several standing and one sitting rest break due to leg fatigue and discomfort bilat.  Pt reports this is chronic.  Pain subsides with rest.  Pt back to room for education in anticipation of discharge today.  Reviewed heart healthy diet with diabetes modifications, exercise guidelines as tolerated, ntg protocol with alert 911 for unrelieved chest pain. Pt declined referral to outpatient cardiac rehab due to bilat leg claudication. Pt is agreeable to rehab once her legs are treated and exercise is tolerable. [id number]  Arna Medici

## 2012-04-24 NOTE — Discharge Summary (Signed)
Discharge Summary   Patient ID: Leah Ferrell MRN: [id number], DOB/AGE: Dec 04, 1950 62 y.o. Admit date: 04/22/2012 D/C date:     04/24/2012  Primary Cardiologist: Diona Browner  Primary Discharge Diagnoses:  1. Acute diastolic CHF 2. CAD/NSTEMI s/p DES to LAD 04/23/12 3. Hypertensive urgency 4. Suspected PVD with abnormal ABIs at [facility] 5. Leukocytosis, will need to f/u PCP 6. HTN 7. Diabetes mellitus, insulin-dependent 8. Morbid obesity BMI 38.5 9. Hyperlipidemia - statin changed, consider OP f/u labs  Hospital Course:Leah Ferrell is a 62 year old female, with no documented history of coronary artery disease, but with multiple cardiac risk factors notable for IDDM, HTN, HLD, and age. She underwent previous noninvasive evaluation with echocardiography and normal pharmacologic nuclear imaging study in 04/2011. Echocardiogram indicated normal LVF (EF 50-55%), with no focal WMAs, normal RVF, and no significant valvular abnormalities. She presented initially to [facility] with complaints of SOB. She presented with a blood pressure of 189/106, pulse 101, and was afebrile. CXR suggested pulm edema and she was treated with neb, antibiotics, and IV Lasix. Admission EKG indicated normal sinus rhythm with no acute changes. Initial troponin was 0.06 and her mild troponin leak was felt consistent with NSTEMI. She was admitted to the hospital for further evaluation and echo was obtained with EF >65%. She was placed on IV Lasix for diuresis and transferred to [facility] for cardiac catheterization. This demonstrated: 1. Single vessel obstructive CAD  2. Normal LV function.  3. Successful stenting of the mid LAD with a DES.  DAPT for 1 year was recommended. This morning she feels well. She will be discharged on Lasix 20mg  daily with BMET in 1 week and TOC appt for NSTEMI. Per report, ABI at [facility] showed R: 0.49, L 0.39 and she will need outpatient eval by Dr. Kirke Corin. Her leukocytosis is  chronic and she was instructed to f/u PCP regarding this. Dr. Jens Som has seen and examined her and feels she is stable for discharge.   Discharge Vitals: Blood pressure 164/54, pulse 70, temperature 98.2 F (36.8 C), temperature source Oral, resp. rate 20, height 5\' 2"  (1.575 m), weight 213 lb 3 oz (96.7 kg), SpO2 96.00%.  Labs: Lab Results  Component Value Date   WBC 14.2* 04/24/2012   HGB 13.6 04/24/2012   HCT 39.8 04/24/2012   MCV 91.9 04/24/2012   PLT 279 04/24/2012     Recent Labs Lab 04/24/12 0645  NA 137  K 3.5  CL 99  CO2 31  BUN 11  CREATININE 0.60  CALCIUM 9.6  GLUCOSE 195*    Diagnostic Studies/Procedures   1. Cardiac catheterization this admission, please see full report and above for summary. For other [facility] studies please see H&P. ABI's as above.  Discharge Medications     Medication List    STOP taking these medications       lisinopril-hydrochlorothiazide 20-12.5 MG per tablet  Commonly known as:  PRINZIDE,ZESTORETIC     pravastatin 80 MG tablet  Commonly known as:  PRAVACHOL      TAKE these medications       amLODipine 5 MG tablet  Commonly known as:  NORVASC  Take 1 tablet (5 mg total) by mouth daily.     aspirin 81 MG EC tablet  Take 1 tablet (81 mg total) by mouth daily.     atorvastatin 40 MG tablet  Commonly known as:  LIPITOR  Take 1 tablet (40 mg total) by mouth daily.     calcium carbonate 1250  MG tablet  Commonly known as:  OS-CAL - dosed in mg of elemental calcium  Take 1 tablet by mouth 2 (two) times daily.     fish oil-omega-3 fatty acids 1000 MG capsule  Take 1 g by mouth every morning.     furosemide 20 MG tablet  Commonly known as:  LASIX  Take 1 tablet (20 mg total) by mouth daily.     insulin aspart 100 UNIT/ML injection  Commonly known as:  novoLOG  Inject 5-20 Units into the skin 3 (three) times daily as needed for high blood sugar. For CBG greater than 250 take 5 units; for every 50 above that take an additional  5 units     insulin aspart protamine-insulin aspart (70-30) 100 UNIT/ML injection  Commonly known as:  NOVOLOG 70/30  Inject 98 Units into the skin 3 (three) times daily with meals.     lisinopril 20 MG tablet  Commonly known as:  PRINIVIL,ZESTRIL  Take 1 tablet (20 mg total) by mouth 2 (two) times daily.     losartan 100 MG tablet  Commonly known as:  COZAAR  Take 1 tablet (100 mg total) by mouth daily.     metoprolol 50 MG tablet  Commonly known as:  LOPRESSOR  Take 50 mg by mouth 2 (two) times daily.     montelukast 10 MG tablet  Commonly known as:  SINGULAIR  Take 10 mg by mouth at bedtime.     nitroGLYCERIN 0.4 MG SL tablet  Commonly known as:  NITROSTAT  Place 1 tablet (0.4 mg total) under the tongue every 5 (five) minutes as needed for chest pain (up to 3 doses).     Ticagrelor 90 MG Tabs tablet  Commonly known as:  BRILINTA  Take 1 tablet (90 mg total) by mouth 2 (two) times daily.        Disposition   The patient will be discharged in stable condition to home. Discharge Orders   Future Orders Complete By Expires     Diet - low sodium heart healthy  As directed     Comments:      Diabetic Diet    Discharge instructions  As directed     Comments:      For patients with heart failure, we give them these special instructions:  1. Follow a low-salt diet and watch your fluid intake. In general, you should not be taking in more than 2 liters of fluid per day. Some patients are restricted to less than 1.5 liters. This includes sources of water in foods like soup. 2. Weigh yourself on the same scale at same time of day and keep a log. 3. Call your doctor: (Anytime you feel any of the following symptoms)  - 3-4 pound weight gain in 1-2 days or 2 pounds overnight  - Shortness of breath, with or without a dry hacking cough  - Swelling in the hands, feet or stomach  - If you have to sleep on extra pillows at night in order to breathe    Increase activity slowly  As  directed     Comments:      No driving for 1 week. No lifting over 10 lbs for 2 weeks. No sexual activity for 2 weeks. You may return to work in 1 week. Keep procedure site clean & dry. If you notice increased pain, swelling, bleeding or pus, call/return!  You may shower, but no soaking baths/hot tubs/pools for 1 week.  Follow-up Information   Follow up with Primary Care Doctor. (Your white blood cell count appears chronically elevated. Please follow up with primary doctor for evaluation/monitoring of this, as well as your diabetes.)       Follow up with [facility]. (Office will call you for an appointment and labwork. You will also be referred to Dr. Kirke Corin for evaluation of peripheral vascular disease.)    Contact information:   [address] [place] [address] [phone]        Duration of Discharge Encounter: Greater than 30 minutes including physician and PA time.  Signed, Ronie Spies PA-C 04/24/2012, 9:37 AM

## 2012-04-25 NOTE — Care Management (Signed)
Cm spoke with patient at home at [phone] concerning CM consult for Brilinta. Per pt pharmacy was able to fill RX. No other needs stated. Adult daughter to assist in home care.   Leah Ferrell Lareta Bruneau,RN,BSN [phone]

## 2012-04-26 ENCOUNTER — Other Ambulatory Visit: Payer: Self-pay | Admitting: Physician Assistant

## 2012-04-26 MED ORDER — LOSARTAN POTASSIUM 100 MG PO TABS: 100.0000 mg | ORAL_TABLET | Freq: Every day | ORAL | Status: DC

## 2012-04-26 MED FILL — Dextrose Inj 5%: INTRAVENOUS | Qty: 50 | Status: AC

## 2012-04-26 NOTE — Progress Notes (Signed)
Received e-prescribing error on cozaar. Sent prescription back in. Please note, the patient is on both ACEI/ARB. Discussed with Dr. Jens Som on day of discharge and she is to continue both classes as she was taking these meds both at home as well. Dayna Dunn PA-C

## 2012-04-27 ENCOUNTER — Other Ambulatory Visit: Payer: Self-pay

## 2012-04-27 ENCOUNTER — Other Ambulatory Visit: Payer: Self-pay | Admitting: *Deleted

## 2012-04-27 ENCOUNTER — Encounter (HOSPITAL_COMMUNITY)
Admission: EM | Disposition: A | Discharge status: Home / Self Care | Payer: Self-pay | Source: Home / Self Care | Attending: Emergency Medicine

## 2012-04-27 ENCOUNTER — Encounter (HOSPITAL_COMMUNITY): Payer: Self-pay | Admitting: *Deleted

## 2012-04-27 ENCOUNTER — Emergency Department (HOSPITAL_COMMUNITY): Payer: 59

## 2012-04-27 ENCOUNTER — Inpatient Hospital Stay (HOSPITAL_COMMUNITY)
Admission: EM | Admit: 2012-04-27 | Discharge: 2012-04-30 | DRG: 287 | Disposition: A | Discharge status: Home / Self Care | Payer: 59 | Attending: Emergency Medicine | Admitting: Emergency Medicine

## 2012-04-27 DIAGNOSIS — E1169 Type 2 diabetes mellitus with other specified complication: Secondary | ICD-10-CM | POA: Diagnosis present

## 2012-04-27 DIAGNOSIS — I209 Angina pectoris, unspecified: Secondary | ICD-10-CM | POA: Diagnosis present

## 2012-04-27 DIAGNOSIS — D72829 Elevated white blood cell count, unspecified: Secondary | ICD-10-CM | POA: Diagnosis present

## 2012-04-27 DIAGNOSIS — E119 Type 2 diabetes mellitus without complications: Secondary | ICD-10-CM

## 2012-04-27 DIAGNOSIS — E1159 Type 2 diabetes mellitus with other circulatory complications: Secondary | ICD-10-CM | POA: Diagnosis present

## 2012-04-27 DIAGNOSIS — Z87891 Personal history of nicotine dependence: Secondary | ICD-10-CM

## 2012-04-27 DIAGNOSIS — E785 Hyperlipidemia, unspecified: Secondary | ICD-10-CM | POA: Diagnosis present

## 2012-04-27 DIAGNOSIS — I252 Old myocardial infarction: Secondary | ICD-10-CM

## 2012-04-27 DIAGNOSIS — I214 Non-ST elevation (NSTEMI) myocardial infarction: Secondary | ICD-10-CM

## 2012-04-27 DIAGNOSIS — Z7902 Long term (current) use of antithrombotics/antiplatelets: Secondary | ICD-10-CM

## 2012-04-27 DIAGNOSIS — I10 Essential (primary) hypertension: Secondary | ICD-10-CM | POA: Diagnosis present

## 2012-04-27 DIAGNOSIS — R072 Precordial pain: Principal | ICD-10-CM

## 2012-04-27 DIAGNOSIS — I152 Hypertension secondary to endocrine disorders: Secondary | ICD-10-CM | POA: Diagnosis present

## 2012-04-27 DIAGNOSIS — R748 Abnormal levels of other serum enzymes: Secondary | ICD-10-CM | POA: Diagnosis present

## 2012-04-27 DIAGNOSIS — E782 Mixed hyperlipidemia: Secondary | ICD-10-CM | POA: Diagnosis present

## 2012-04-27 DIAGNOSIS — I509 Heart failure, unspecified: Secondary | ICD-10-CM | POA: Diagnosis present

## 2012-04-27 DIAGNOSIS — Z9861 Coronary angioplasty status: Secondary | ICD-10-CM

## 2012-04-27 DIAGNOSIS — Z794 Long term (current) use of insulin: Secondary | ICD-10-CM

## 2012-04-27 DIAGNOSIS — I739 Peripheral vascular disease, unspecified: Secondary | ICD-10-CM

## 2012-04-27 DIAGNOSIS — I5032 Chronic diastolic (congestive) heart failure: Secondary | ICD-10-CM | POA: Diagnosis present

## 2012-04-27 DIAGNOSIS — I2510 Atherosclerotic heart disease of native coronary artery without angina pectoris: Secondary | ICD-10-CM | POA: Diagnosis present

## 2012-04-27 DIAGNOSIS — I7389 Other specified peripheral vascular diseases: Secondary | ICD-10-CM | POA: Diagnosis present

## 2012-04-27 DIAGNOSIS — Z6837 Body mass index (BMI) 37.0-37.9, adult: Secondary | ICD-10-CM | POA: Diagnosis present

## 2012-04-27 DIAGNOSIS — Z7982 Long term (current) use of aspirin: Secondary | ICD-10-CM

## 2012-04-27 DIAGNOSIS — R079 Chest pain, unspecified: Secondary | ICD-10-CM

## 2012-04-27 HISTORY — DX: Non-ST elevation (NSTEMI) myocardial infarction: I21.4

## 2012-04-27 HISTORY — PX: CORONARY ANGIOGRAM: SHX5466

## 2012-04-27 HISTORY — DX: Cardiac arrhythmia, unspecified: I49.9

## 2012-04-27 LAB — CBC
MCH: 31.8 pg (ref 26.0–34.0)
MCHC: 34.8 g/dL (ref 30.0–36.0)
MCV: 91.3 fL (ref 78.0–100.0)
Platelets: 290 10*3/uL (ref 150–400)
RDW: 14.1 % (ref 11.5–15.5)

## 2012-04-27 LAB — POCT I-STAT TROPONIN I: Troponin i, poc: 0.01 ng/mL (ref 0.00–0.08)

## 2012-04-27 LAB — POCT I-STAT, CHEM 8
Calcium, Ion: 1.21 mmol/L (ref 1.13–1.30)
Hemoglobin: 13.9 g/dL (ref 12.0–15.0)
Sodium: 137 mEq/L (ref 135–145)
TCO2: 30 mmol/L (ref 0–100)

## 2012-04-27 LAB — PROTIME-INR: INR: 0.94 (ref 0.00–1.49)

## 2012-04-27 LAB — CK TOTAL AND CKMB (NOT AT ARMC): Relative Index: 3.2 — ABNORMAL HIGH (ref 0.0–2.5)

## 2012-04-27 LAB — TROPONIN I: Troponin I: 0.35 ng/mL (ref <0.30)

## 2012-04-27 SURGERY — CORONARY ANGIOGRAM

## 2012-04-27 MED ORDER — ASPIRIN 300 MG RE SUPP
300.0000 mg | RECTAL | Status: AC
  Filled 2012-04-27: qty 1

## 2012-04-27 MED ORDER — ONDANSETRON HCL 4 MG/2ML IJ SOLN: 4.0000 mg | Freq: Four times a day (QID) | INTRAMUSCULAR | Status: DC | PRN

## 2012-04-27 MED ORDER — SODIUM CHLORIDE 0.9 % IV SOLN: INTRAVENOUS | Status: DC

## 2012-04-27 MED ORDER — HEPARIN (PORCINE) IN NACL 2-0.9 UNIT/ML-% IJ SOLN
INTRAMUSCULAR | Status: AC
  Filled 2012-04-27: qty 1000

## 2012-04-27 MED ORDER — NITROGLYCERIN 0.4 MG SL SUBL
0.4000 mg | SUBLINGUAL_TABLET | SUBLINGUAL | Status: DC | PRN
  Filled 2012-04-27: qty 25

## 2012-04-27 MED ORDER — TICAGRELOR 90 MG PO TABS
90.0000 mg | ORAL_TABLET | Freq: Two times a day (BID) | ORAL | Status: DC
  Administered 2012-04-27 – 2012-04-30 (×6): 90 mg via ORAL
  Filled 2012-04-27 (×7): qty 1

## 2012-04-27 MED ORDER — LOSARTAN POTASSIUM 50 MG PO TABS
100.0000 mg | ORAL_TABLET | Freq: Every day | ORAL | Status: DC
  Administered 2012-04-28 – 2012-04-30 (×3): 100 mg via ORAL
  Filled 2012-04-27 (×4): qty 2

## 2012-04-27 MED ORDER — INSULIN GLARGINE 100 UNIT/ML ~~LOC~~ SOLN
20.0000 [IU] | Freq: Two times a day (BID) | SUBCUTANEOUS | Status: DC
  Administered 2012-04-27 – 2012-04-30 (×6): 20 [IU] via SUBCUTANEOUS

## 2012-04-27 MED ORDER — ASPIRIN 81 MG PO CHEW: 324.0000 mg | CHEWABLE_TABLET | ORAL | Status: AC

## 2012-04-27 MED ORDER — ASPIRIN 325 MG PO TABS
325.0000 mg | ORAL_TABLET | Freq: Every day | ORAL | Status: DC
  Administered 2012-04-28 – 2012-04-29 (×2): 325 mg via ORAL
  Filled 2012-04-27 (×3): qty 1

## 2012-04-27 MED ORDER — MIDAZOLAM HCL 2 MG/2ML IJ SOLN
INTRAMUSCULAR | Status: AC
  Filled 2012-04-27: qty 2

## 2012-04-27 MED ORDER — ASPIRIN EC 81 MG PO TBEC: 81.0000 mg | DELAYED_RELEASE_TABLET | Freq: Every day | ORAL | Status: DC

## 2012-04-27 MED ORDER — NITROGLYCERIN 0.4 MG SL SUBL: 0.4000 mg | SUBLINGUAL_TABLET | SUBLINGUAL | Status: DC | PRN

## 2012-04-27 MED ORDER — TICAGRELOR 90 MG PO TABS
ORAL_TABLET | ORAL | Status: AC
  Filled 2012-04-27: qty 1

## 2012-04-27 MED ORDER — OXYCODONE-ACETAMINOPHEN 5-325 MG PO TABS
ORAL_TABLET | ORAL | Status: AC
  Filled 2012-04-27: qty 2

## 2012-04-27 MED ORDER — HEPARIN (PORCINE) IN NACL 100-0.45 UNIT/ML-% IJ SOLN
1350.0000 [IU]/h | INTRAMUSCULAR | Status: DC
  Administered 2012-04-27: 1200 [IU]/h via INTRAVENOUS
  Administered 2012-04-28: 1350 [IU]/h via INTRAVENOUS
  Filled 2012-04-27 (×4): qty 250

## 2012-04-27 MED ORDER — INSULIN ASPART 100 UNIT/ML ~~LOC~~ SOLN
0.0000 [IU] | Freq: Three times a day (TID) | SUBCUTANEOUS | Status: DC
  Administered 2012-04-28 (×2): 7 [IU] via SUBCUTANEOUS
  Administered 2012-04-28: 11 [IU] via SUBCUTANEOUS
  Administered 2012-04-29: 7 [IU] via SUBCUTANEOUS
  Administered 2012-04-29 – 2012-04-30 (×3): 11 [IU] via SUBCUTANEOUS
  Administered 2012-04-30: 7 [IU] via SUBCUTANEOUS

## 2012-04-27 MED ORDER — ACETAMINOPHEN 325 MG PO TABS: 650.0000 mg | ORAL_TABLET | ORAL | Status: DC | PRN

## 2012-04-27 MED ORDER — FENTANYL CITRATE 0.05 MG/ML IJ SOLN
INTRAMUSCULAR | Status: AC
  Filled 2012-04-27: qty 2

## 2012-04-27 MED ORDER — MONTELUKAST SODIUM 10 MG PO TABS
10.0000 mg | ORAL_TABLET | Freq: Every day | ORAL | Status: DC
  Administered 2012-04-27 – 2012-04-29 (×3): 10 mg via ORAL
  Filled 2012-04-27 (×4): qty 1

## 2012-04-27 MED ORDER — AMLODIPINE BESYLATE 5 MG PO TABS
5.0000 mg | ORAL_TABLET | Freq: Every day | ORAL | Status: DC
  Administered 2012-04-28 – 2012-04-30 (×3): 5 mg via ORAL
  Filled 2012-04-27 (×4): qty 1

## 2012-04-27 MED ORDER — INSULIN ASPART 100 UNIT/ML ~~LOC~~ SOLN
6.0000 [IU] | Freq: Three times a day (TID) | SUBCUTANEOUS | Status: DC
  Administered 2012-04-27 – 2012-04-30 (×9): 6 [IU] via SUBCUTANEOUS

## 2012-04-27 MED ORDER — METOPROLOL TARTRATE 50 MG PO TABS
50.0000 mg | ORAL_TABLET | Freq: Two times a day (BID) | ORAL | Status: DC
  Administered 2012-04-27 – 2012-04-30 (×6): 50 mg via ORAL
  Filled 2012-04-27 (×7): qty 1

## 2012-04-27 MED ORDER — INSULIN ASPART 100 UNIT/ML ~~LOC~~ SOLN
0.0000 [IU] | Freq: Every day | SUBCUTANEOUS | Status: DC
  Administered 2012-04-28: 2 [IU] via SUBCUTANEOUS
  Administered 2012-04-29: 4 [IU] via SUBCUTANEOUS

## 2012-04-27 MED ORDER — ONDANSETRON HCL 4 MG/2ML IJ SOLN
4.0000 mg | Freq: Once | INTRAMUSCULAR | Status: AC
  Administered 2012-04-27: 4 mg via INTRAVENOUS
  Filled 2012-04-27: qty 2

## 2012-04-27 MED ORDER — HEPARIN (PORCINE) IN NACL 100-0.45 UNIT/ML-% IJ SOLN
900.0000 [IU]/h | INTRAMUSCULAR | Status: DC
  Administered 2012-04-27: 900 [IU]/h via INTRAVENOUS
  Filled 2012-04-27: qty 250

## 2012-04-27 MED ORDER — LISINOPRIL 20 MG PO TABS
20.0000 mg | ORAL_TABLET | Freq: Two times a day (BID) | ORAL | Status: DC
  Administered 2012-04-27 – 2012-04-30 (×6): 20 mg via ORAL
  Filled 2012-04-27 (×7): qty 1

## 2012-04-27 MED ORDER — MORPHINE SULFATE 4 MG/ML IJ SOLN
4.0000 mg | Freq: Once | INTRAMUSCULAR | Status: AC
  Administered 2012-04-27: 4 mg via INTRAVENOUS
  Filled 2012-04-27: qty 1

## 2012-04-27 MED ORDER — ATORVASTATIN CALCIUM 40 MG PO TABS
40.0000 mg | ORAL_TABLET | Freq: Every day | ORAL | Status: DC
  Administered 2012-04-27 – 2012-04-29 (×3): 40 mg via ORAL
  Filled 2012-04-27 (×4): qty 1

## 2012-04-27 MED ORDER — LIDOCAINE HCL (PF) 1 % IJ SOLN
INTRAMUSCULAR | Status: AC
  Filled 2012-04-27: qty 30

## 2012-04-27 MED ORDER — SODIUM CHLORIDE 0.9 % IV BOLUS (SEPSIS)
1000.0000 mL | Freq: Once | INTRAVENOUS | Status: AC
  Administered 2012-04-27: 1000 mL via INTRAVENOUS

## 2012-04-27 MED ORDER — HEPARIN BOLUS VIA INFUSION
4000.0000 [IU] | Freq: Once | INTRAVENOUS | Status: AC
  Administered 2012-04-27: 4000 [IU] via INTRAVENOUS

## 2012-04-27 MED ORDER — OXYCODONE-ACETAMINOPHEN 5-325 MG PO TABS
2.0000 | ORAL_TABLET | Freq: Four times a day (QID) | ORAL | Status: DC | PRN
  Administered 2012-04-27: 2 via ORAL
  Filled 2012-04-27: qty 2

## 2012-04-27 NOTE — ED Provider Notes (Signed)
History     CSN: [id number]  Arrival date & time 04/27/12  0024   First MD Initiated Contact with Patient 04/27/12 0117      Chief Complaint  Patient presents with  . Chest Pain    (Consider location/radiation/quality/duration/timing/severity/associated sxs/prior treatment) HPI History provided by patient. Chest pain tonight left-sided radiates to mid chest described as both sharp and pressure-like. Some mild shortness of breath. With onset of symptoms patient took a nitroglycerin, pain persisted and she took another, pain persisted so she called EMS and took a third nitroglycerin.  When EMS arrived her pain started to reside.  Patient just had an LAD stent placed was discharged home 2 days ago.  Her initial presenting symptoms were shortness of breath without any chest pain.   She had a stress test at [facility] and then underwent cardiac catheterization.  She had been doing well since discharge until tonight. Symptoms moderate in severity, has had some return of mild chest pain in the emergency department.  Now pain free. Past Medical History  Diagnosis Date  . Hypertension   . Diabetes mellitus without complication   . Hyperlipidemia   . CAD (coronary artery disease)     a. 04/2012 NSTEMI: s/p DES to LAD.  Marland Kitchen PVD (peripheral vascular disease)     a. 04/2012 ABI: R 0.49, L 0.39.  Marland Kitchen Chronic diastolic CHF (congestive heart failure)   . Leukocytosis   . Morbid obesity     Past Surgical History  Procedure Laterality Date  . Tumor removal    . Lad stent      History reviewed. No pertinent family history.  History  Substance Use Topics  . Smoking status: Former Smoker    Quit date: 02/17/2001  . Smokeless tobacco: Never Used  . Alcohol Use: No    OB History   Grav Para Term Preterm Abortions TAB SAB Ect Mult Living                  Review of Systems  Constitutional: Negative for fever and chills.  HENT: Negative for neck pain and neck stiffness.   Eyes:  Negative for pain.  Respiratory: Positive for shortness of breath.   Cardiovascular: Positive for chest pain.  Gastrointestinal: Negative for vomiting and abdominal pain.  Genitourinary: Negative for dysuria.  Musculoskeletal: Negative for back pain.  Skin: Negative for rash.  Neurological: Negative for headaches.  All other systems reviewed and are negative.    Allergies  Review of patient's allergies indicates no known allergies.  Home Medications   Current Outpatient Rx  Name  Route  Sig  Dispense  Refill  . amLODipine (NORVASC) 5 MG tablet   Oral   Take 1 tablet (5 mg total) by mouth daily.   30 tablet   6   . aspirin 325 MG tablet   Oral   Take 325 mg by mouth daily.         Marland Kitchen atorvastatin (LIPITOR) 40 MG tablet   Oral   Take 1 tablet (40 mg total) by mouth daily.   30 tablet   6   . calcium carbonate (OS-CAL - DOSED IN MG OF ELEMENTAL CALCIUM) 1250 MG tablet   Oral   Take 1 tablet by mouth 2 (two) times daily.         . cholecalciferol (VITAMIN D) 1000 UNITS tablet   Oral   Take 1,000 Units by mouth daily.         . colesevelam [facility])  625 MG tablet   Oral   Take 625 mg by mouth 3 (three) times daily.         . fish oil-omega-3 fatty acids 1000 MG capsule   Oral   Take 1 g by mouth every morning.         . furosemide (LASIX) 20 MG tablet   Oral   Take 1 tablet (20 mg total) by mouth daily.   30 tablet   6   . insulin aspart (NOVOLOG) 100 UNIT/ML injection   Subcutaneous   Inject 5-20 Units into the skin 3 (three) times daily as needed for high blood sugar. For CBG greater than 250 take 5 units; for every 50 above that take an additional 5 units         . insulin aspart protamine-insulin aspart (NOVOLOG 70/30) (70-30) 100 UNIT/ML injection   Subcutaneous   Inject 98 Units into the skin 3 (three) times daily with meals.         Marland Kitchen lisinopril (PRINIVIL,ZESTRIL) 20 MG tablet   Oral   Take 1 tablet (20 mg total) by mouth 2 (two) times  daily.   60 tablet   6   . losartan (COZAAR) 100 MG tablet   Oral   Take 1 tablet (100 mg total) by mouth daily.   30 tablet   6   . metoprolol (LOPRESSOR) 50 MG tablet   Oral   Take 50 mg by mouth 2 (two) times daily.         . montelukast (SINGULAIR) 10 MG tablet   Oral   Take 10 mg by mouth at bedtime.         . Multiple Vitamin (MULTIVITAMIN WITH MINERALS) TABS   Oral   Take 1 tablet by mouth daily.         . Ticagrelor (BRILINTA) 90 MG TABS tablet   Oral   Take 1 tablet (90 mg total) by mouth 2 (two) times daily.   60 tablet   10   . nitroGLYCERIN (NITROSTAT) 0.4 MG SL tablet   Sublingual   Place 1 tablet (0.4 mg total) under the tongue every 5 (five) minutes as needed for chest pain (up to 3 doses).   25 tablet   4     BP 143/56  Pulse 73  Temp(Src) 98.3 F (36.8 C) (Oral)  Resp 17  Ht 5\' 2"  (1.575 m)  Wt 212 lb (96.163 kg)  BMI 38.77 kg/m2  SpO2 97%  Physical Exam  Constitutional: She is oriented to person, place, and time. She appears well-developed and well-nourished.  HENT:  Head: Normocephalic and atraumatic.  Eyes: EOM are normal. Pupils are equal, round, and reactive to light.  Neck: Neck supple.  Cardiovascular: Normal rate, regular rhythm and intact distal pulses.   Pulmonary/Chest: Effort normal and breath sounds normal. No respiratory distress.  Abdominal: Soft. Bowel sounds are normal. She exhibits no distension. There is no tenderness. There is no rebound and no guarding.  Musculoskeletal: Normal range of motion. She exhibits no edema and no tenderness.  Neurological: She is alert and oriented to person, place, and time.  Skin: Skin is warm and dry.    ED Course  Procedures (including critical care time)  Results for orders placed during the hospital encounter of 04/27/12  CBC      Result Value Range   WBC 16.1 (*) 4.0 - 10.5 K/uL   RBC 4.25  3.87 - 5.11 MIL/uL   Hemoglobin 13.5  12.0 -  15.0 g/dL   HCT 16.1  09.6 - 04.5 %    MCV 91.3  78.0 - 100.0 fL   MCH 31.8  26.0 - 34.0 pg   MCHC 34.8  30.0 - 36.0 g/dL   RDW 40.9  81.1 - 91.4 %   Platelets 290  150 - 400 K/uL  TROPONIN I      Result Value Range   Troponin I 0.40 (*) <0.30 ng/mL  POCT I-STAT, CHEM 8      Result Value Range   Sodium 137  135 - 145 mEq/L   Potassium 3.8  3.5 - 5.1 mEq/L   Chloride 99  96 - 112 mEq/L   BUN 12  6 - 23 mg/dL   Creatinine, Ser 7.82  0.50 - 1.10 mg/dL   Glucose, Bld 956 (*) 70 - 99 mg/dL   Calcium, Ion 2.13  0.86 - 1.30 mmol/L   TCO2 30  0 - 100 mmol/L   Hemoglobin 13.9  12.0 - 15.0 g/dL   HCT 57.8  46.9 - 62.9 %  POCT I-STAT TROPONIN I      Result Value Range   Troponin i, poc 0.01  0.00 - 0.08 ng/mL   Comment 3            Dg Chest Portable 1 View  04/27/2012  *RADIOLOGY REPORT*  Clinical Data: Chest pain  PORTABLE CHEST - 1 VIEW  Comparison: 04/20/2012  Findings: Heart size upper normal to mildly enlarged.  Aortic atherosclerosis.  Interstitial prominence.  No confluent airspace opacity.  No pleural effusion or pneumothorax.  No acute osseous finding.  Bilateral acromioclavicular degenerative change.  IMPRESSION: Heart size upper normal to mildly enlarged.  Interstitial prominence may be chronic, or reflect interstitial edema versus atypical infection in the appropriate clinical setting.   Original Report Authenticated By: Jearld Lesch, M.D.       CRITICAL CARE Performed by: Sunnie Nielsen   Total critical care time: 40  Critical care time was exclusive of separately billable procedures and treating other patients.  Critical care was necessary to treat or prevent imminent or life-threatening deterioration.  Critical care was time spent personally by me on the following activities: development of treatment plan with patient and/or surrogate as well as nursing, discussions with consultants, evaluation of patient's response to treatment, examination of patient, obtaining history from patient or surrogate, ordering  and performing treatments and interventions, ordering and review of laboratory studies, ordering and review of radiographic studies, pulse oximetry and re-evaluation of patient's condition.    Date: 04/27/2012  Rate: 83  Rhythm: normal sinus rhythm  QRS Axis: normal  Intervals: normal  ST/T Wave abnormalities: nonspecific ST changes  Conduction Disutrbances:none  Narrative Interpretation:   Old EKG Reviewed: unchanged  ASA and NTG PTA  IV [place]  2:35 AM d/w CAR - DR Ronney Asters on call for [place] will evaluate bedside.  2:57 AM plan delta troponin, if neg and PT pain free plan discharge and outpatient follow up  5:18 AM repeat troponin is elevated. Patient remains pain-free in the emergency department. Repeat EKG unchanged. Cardiology notified for admission. Heparin initiated.   Date: 04/27/2012  Rate: 64  Rhythm: normal sinus rhythm  QRS Axis: normal  Intervals: normal  ST/T Wave abnormalities: nonspecific ST changes  Conduction Disutrbances:none  Narrative Interpretation:   Old EKG Reviewed: unchanged  5:35 AM DR Ronney Asters requesting CK MB - will admit  MDM   Chest pain with recent stenting of LAD  Evaluated with serial enzymes  and EKGs with second troponin elevated  Medications provided including aspirin heparin  Chest x-ray reviewed  Cardiology consult and admission        Sunnie Nielsen, MD 04/27/12 [phone]

## 2012-04-27 NOTE — H&P (View-Only) (Signed)
Patient has recurrent chest pain in setting of recent PCI.  Needs recath early to determine situation.  Would have low threshold for considering PE.  WBC is up with mildly abnormal CXR.  Cath first since findings, then possible PE evaluation.    Discussed with patient and she is agreeable to cath.     

## 2012-04-27 NOTE — Progress Notes (Signed)
ANTICOAGULATION CONSULT NOTE - Initial Consult  Pharmacy Consult for heparin  Indication: chest pain/ACS  No Known Allergies  Patient Measurements: Height: 5\' 2"  (157.5 cm) Weight: 212 lb (96.163 kg) IBW/kg (Calculated) : 50.1 Heparin Dosing Weight: 73 kg  Vital Signs: Temp: 98.3 F (36.8 C) (03/11 0057) Temp src: Oral (03/11 0057) BP: 130/51 mmHg (03/11 0500) Pulse Rate: 64 (03/11 0500)  Labs:  Recent Labs  04/24/12 0645 04/27/12 0133 04/27/12 0150 04/27/12 0421  HGB 13.6 13.5 13.9  --   HCT 39.8 38.8 41.0  --   PLT 279 290  --   --   CREATININE 0.60  --  0.70  --   TROPONINI  --   --   --  0.40*    Estimated Creatinine Clearance: 79.9 ml/min (by C-G formula based on Cr of 0.7).   Medical History: Past Medical History  Diagnosis Date  . Hypertension   . Diabetes mellitus without complication   . Hyperlipidemia   . CAD (coronary artery disease)     a. 04/2012 NSTEMI: s/p DES to LAD.  Marland Kitchen PVD (peripheral vascular disease)     a. 04/2012 ABI: R 0.49, L 0.39.  Marland Kitchen Chronic diastolic CHF (congestive heart failure)   . Leukocytosis   . Morbid obesity     Medications:  Scheduled:  . [COMPLETED]  morphine injection  4 mg Intravenous Once  . [COMPLETED] ondansetron  4 mg Intravenous Once    Assessment: 62 yo female presented with chest pain. Pharmacy to manage IV heparin.   Goal of Therapy:  Heparin level 0.3-0.7 units/ml Monitor platelets by anticoagulation protocol: Yes   Plan:  1. Heparin 4000 unit IV bolus x 1, then IV infusion of 900 units/hr.  2. Heparin level in 6 hours.  3. Daily CBC, heparin level   Emeline Gins 04/27/2012,5:12 AM

## 2012-04-27 NOTE — Interval H&P Note (Signed)
History and Physical Interval Note:  04/27/2012 11:13 AM  Leah Ferrell  has presented today for surgery, with the diagnosis of cp  The various methods of treatment have been discussed with the patient and family. After consideration of risks, benefits and other options for treatment, the patient has consented to  Procedure(s): LEFT HEART CATHETERIZATION WITH CORONARY ANGIOGRAM (N/A) as a surgical intervention .  The patient's history has been reviewed, patient examined, no change in status, stable for surgery.  I have reviewed the patient's chart and labs.  Questions were answered to the patient's satisfaction.     Elyn Aquas.

## 2012-04-27 NOTE — ED Notes (Signed)
Per pt request, pt family called and updated on pt status. Family on the way.

## 2012-04-27 NOTE — Progress Notes (Signed)
Patient has recurrent chest pain in setting of recent PCI.  Needs recath early to determine situation.  Would have low threshold for considering PE.  WBC is up with mildly abnormal CXR.  Cath first since findings, then possible PE evaluation.    Discussed with patient and she is agreeable to cath.

## 2012-04-27 NOTE — ED Notes (Signed)
New and old EKG given to Dr. Dierdre Highman

## 2012-04-27 NOTE — H&P (Signed)
Cardiology H&P  Primary Care Povider: No primary provider on file. Primary Cardiologist: Francesca Oman   HPI: Ms. Handyside is a 62 y.o.female with hx below presented to ED with acute onset of chest pain. Patient underwnet PCI to mLAD several days ago. Since her discharge, she reports that she has largely done well but has been quite sedentary. This evening, while the patient was at home on the couch, she began to have chest discomfort. She is unable to describe the quality of the discomfort. Stated it was moderate in severity and located under her left breast and radiated to the center of her chest. Episode lasted approximately 20 minutes. She called EMS and by the time she presented to the ED, she was CP free. Initial point of care troponin in the ED was negative. Repeat was above the negative cutoff. ECG unchanged. She is being placed in obs for further eval and mgmt.   Past Medical History  Diagnosis Date  . Hypertension   . Diabetes mellitus without complication   . Hyperlipidemia   . CAD (coronary artery disease)     a. 04/2012 NSTEMI: s/p DES to LAD.  Marland Kitchen PVD (peripheral vascular disease)     a. 04/2012 ABI: R 0.49, L 0.39.  Marland Kitchen Chronic diastolic CHF (congestive heart failure)   . Leukocytosis   . Morbid obesity     Past Surgical History  Procedure Laterality Date  . Tumor removal    . Lad stent      History reviewed. No pertinent family history.  Social History:  reports that she quit smoking about 11 years ago. She has never used smokeless tobacco. She reports that she does not drink alcohol or use illicit drugs.  Allergies: No Known Allergies  No current facility-administered medications for this encounter.   Current Outpatient Prescriptions  Medication Sig Dispense Refill  . amLODipine (NORVASC) 5 MG tablet Take 1 tablet (5 mg total) by mouth daily.  30 tablet  6  . aspirin 325 MG tablet Take 325 mg by mouth daily.      Marland Kitchen atorvastatin (LIPITOR) 40 MG tablet Take 1  tablet (40 mg total) by mouth daily.  30 tablet  6  . calcium carbonate (OS-CAL - DOSED IN MG OF ELEMENTAL CALCIUM) 1250 MG tablet Take 1 tablet by mouth 2 (two) times daily.      . cholecalciferol (VITAMIN D) 1000 UNITS tablet Take 1,000 Units by mouth daily.      . colesevelam (WELCHOL) 625 MG tablet Take 625 mg by mouth 3 (three) times daily.      . fish oil-omega-3 fatty acids 1000 MG capsule Take 1 g by mouth every morning.      . furosemide (LASIX) 20 MG tablet Take 1 tablet (20 mg total) by mouth daily.  30 tablet  6  . insulin aspart (NOVOLOG) 100 UNIT/ML injection Inject 5-20 Units into the skin 3 (three) times daily as needed for high blood sugar. For CBG greater than 250 take 5 units; for every 50 above that take an additional 5 units      . insulin aspart protamine-insulin aspart (NOVOLOG 70/30) (70-30) 100 UNIT/ML injection Inject 98 Units into the skin 3 (three) times daily with meals.      Marland Kitchen lisinopril (PRINIVIL,ZESTRIL) 20 MG tablet Take 1 tablet (20 mg total) by mouth 2 (two) times daily.  60 tablet  6  . losartan (COZAAR) 100 MG tablet Take 1 tablet (100 mg total) by mouth daily.  30 tablet  6  . metoprolol (LOPRESSOR) 50 MG tablet Take 50 mg by mouth 2 (two) times daily.      . montelukast (SINGULAIR) 10 MG tablet Take 10 mg by mouth at bedtime.      . Multiple Vitamin (MULTIVITAMIN WITH MINERALS) TABS Take 1 tablet by mouth daily.      . Ticagrelor (BRILINTA) 90 MG TABS tablet Take 1 tablet (90 mg total) by mouth 2 (two) times daily.  60 tablet  10  . nitroGLYCERIN (NITROSTAT) 0.4 MG SL tablet Place 1 tablet (0.4 mg total) under the tongue every 5 (five) minutes as needed for chest pain (up to 3 doses).  25 tablet  4    ROS: A full review of systems is obtained and is negative except as noted in the HPI.  Physical Exam: Blood pressure 130/51, pulse 64, temperature 98.3 F (36.8 C), temperature source Oral, resp. rate 16, height 5\' 2"  (1.575 m), weight 96.163 kg (212 lb), SpO2  96.00%.  GENERAL: no acute distress.  EYES: Extra ocular movements are intact. There is no lid lag. Sclera is anicteric.  ENT: Oropharynx is clear. Dentition is within normal limits.  NECK: Supple. The thyroid is not enlarged.  LYMPH: There are no masses or lymphadenopathy present.  HEART: Regular rate and rhythm with no m/g/r.  Normal S1/S2. No JVD LUNGS: Clear to auscultation There are no rales, rhonchi, or wheezes.  ABDOMEN: Soft, non-tender, and non-distended with normoactive bowel sounds. There is no hepatosplenomegaly.  EXTREMITIES: No clubbing, cyanosis, or edema.  PULSES: Carotids were +2 and equal bilaterally with no bruits. Right radial with bandage over.  SKIN: Warm, dry, and intact.  NEUROLOGIC: The patient was oriented to person, place, and time. No overt neurologic deficits were detected.  PSYCH: Normal judgment and insight, mood is appropriate.   Results: Results for orders placed during the hospital encounter of 04/27/12 (from the past 24 hour(s))  CBC     Status: Abnormal   Collection Time    04/27/12  1:33 AM      Result Value Range   WBC 16.1 (*) 4.0 - 10.5 K/uL   RBC 4.25  3.87 - 5.11 MIL/uL   Hemoglobin 13.5  12.0 - 15.0 g/dL   HCT 25.3  66.4 - 40.3 %   MCV 91.3  78.0 - 100.0 fL   MCH 31.8  26.0 - 34.0 pg   MCHC 34.8  30.0 - 36.0 g/dL   RDW 47.4  25.9 - 56.3 %   Platelets 290  150 - 400 K/uL  POCT I-STAT TROPONIN I     Status: None   Collection Time    04/27/12  1:48 AM      Result Value Range   Troponin i, poc 0.01  0.00 - 0.08 ng/mL   Comment 3           POCT I-STAT, CHEM 8     Status: Abnormal   Collection Time    04/27/12  1:50 AM      Result Value Range   Sodium 137  135 - 145 mEq/L   Potassium 3.8  3.5 - 5.1 mEq/L   Chloride 99  96 - 112 mEq/L   BUN 12  6 - 23 mg/dL   Creatinine, Ser 8.75  0.50 - 1.10 mg/dL   Glucose, Bld 643 (*) 70 - 99 mg/dL   Calcium, Ion 3.29  5.18 - 1.30 mmol/L   TCO2 30  0 - 100 mmol/L   Hemoglobin 13.9  12.0 - 15.0  g/dL   HCT 40.9  81.1 - 91.4 %  TROPONIN I     Status: Abnormal   Collection Time    04/27/12  4:21 AM      Result Value Range   Troponin I 0.40 (*) <0.30 ng/mL    EKG: NSR, rate 83. Normal axis and intervals. No evidence of acute ischemia.  CXR: reviewed.   Assessment/Plan: 62 y/o female with multiple CHD risk factors, s/p recent DES to mLAD now with an episode of chest pain, mildly elevated troponin. Mild troponin elevation is somewhat equivocal in this setting as patient is known to have chronic mildly elevated biomarkers. ECG is non-ischemic and unchanged from historical and CV exam is unremarkable.   Plan is to place patient in observation, rule out acute MI and continue with standard therapies for possible ACS (heparin gtt, dual anti-platelet therapy, etc). Will also continue with patient home cardiac program in interim. NPO for possible procedures this AM.    HERMANN, DANIEL 04/27/2012, 5:33 AM

## 2012-04-27 NOTE — Progress Notes (Signed)
UR Completed Severus Brodzinski Graves-Bigelow, RN,BSN [phone]  

## 2012-04-27 NOTE — ED Notes (Signed)
Pt ambulated to restroom. 

## 2012-04-27 NOTE — ED Notes (Signed)
Lab verified troponin value of 0.40 with RN. EDP Opitz notified.

## 2012-04-27 NOTE — Progress Notes (Signed)
ANTICOAGULATION CONSULT NOTE - Follow up Consult  Pharmacy Consult for heparin  Indication: chest pain/ACS  No Known Allergies  Patient Measurements: Height: 5\' 2"  (157.5 cm) Weight: 211 lb 10.3 oz (96 kg) IBW/kg (Calculated) : 50.1 Heparin Dosing Weight: 73 kg  Vital Signs: Temp: 98 F (36.7 C) (03/11 1613) Temp src: Oral (03/11 1613) BP: 124/60 mmHg (03/11 1613) Pulse Rate: 66 (03/11 1613)  Labs:  Recent Labs  04/27/12 0133 04/27/12 0150 04/27/12 0421 04/27/12 0525  HGB 13.5 13.9  --   --   HCT 38.8 41.0  --   --   PLT 290  --   --   --   LABPROT  --   --   --  12.5  INR  --   --   --  0.94  CREATININE  --  0.70  --   --   CKTOTAL  --   --  114  --   CKMB  --   --  3.7  --   TROPONINI  --   --  0.40*  --     Estimated Creatinine Clearance: 79.9 ml/min (by C-G formula based on Cr of 0.7).   Medical History: Past Medical History  Diagnosis Date  . Hypertension   . Diabetes mellitus without complication   . Hyperlipidemia   . CAD (coronary artery disease)     a. 04/2012 NSTEMI: s/p DES to LAD.  Marland Kitchen PVD (peripheral vascular disease)     a. 04/2012 ABI: R 0.49, L 0.39.  Marland Kitchen Chronic diastolic CHF (congestive heart failure)   . Leukocytosis   . Morbid obesity   . NSTEMI (non-ST elevated myocardial infarction) 04/27/2012  . Dysrhythmia   . Shortness of breath     Medications:  Scheduled:  . amLODipine  5 mg Oral Daily  . [COMPLETED] aspirin  324 mg Oral NOW   Or  . [COMPLETED] aspirin  300 mg Rectal NOW  . aspirin  325 mg Oral Daily  . atorvastatin  40 mg Oral Daily  . [COMPLETED] fentaNYL      . [COMPLETED] heparin      . [COMPLETED] heparin  4,000 Units Intravenous Once  . insulin aspart  0-20 Units Subcutaneous TID WC  . insulin aspart  0-5 Units Subcutaneous QHS  . insulin aspart  6 Units Subcutaneous TID WC  . insulin glargine  20 Units Subcutaneous BID  . [COMPLETED] lidocaine (PF)      . lisinopril  20 mg Oral BID  . losartan  100 mg Oral Daily   . metoprolol  50 mg Oral BID  . [COMPLETED] midazolam      . montelukast  10 mg Oral QHS  . [COMPLETED]  morphine injection  4 mg Intravenous Once  . [COMPLETED] ondansetron  4 mg Intravenous Once  . [COMPLETED] sodium chloride  1,000 mL Intravenous Once  . [COMPLETED] Ticagrelor      . Ticagrelor  90 mg Oral BID  . [DISCONTINUED] aspirin EC  81 mg Oral Daily    Assessment: 62 yo female admitted 04/27/2012  with chest pain. Pharmacy to manage IV heparin.   Events: 04/27/2012 s/p cath, no significant disease, plan to resume heparin 6h s/p sheath out while ruling out PE. Floor RN reports no problems with groin site.  Goal of Therapy:  Heparin level 0.3-0.7 units/ml Monitor platelets by anticoagulation protocol: Yes   Plan:  1. Resume heparin  infusion of 1200 units/hr at 1830 (6h after sheath out) 2. Heparin  level in 6 hours.  3. Daily CBC, heparin level    Thank you for allowing pharmacy to be a part of this patients care team.  Lovenia Kim Pharm.D., BCPS Clinical Pharmacist 04/27/2012 5:01 PM Pager: [phone] Phone: [phone]

## 2012-04-27 NOTE — ED Notes (Signed)
Waiting medication arrival from pharmacy.

## 2012-04-27 NOTE — Care Management Note (Unsigned)
    Page 1 of 1   04/27/2012     4:17:26 PM   CARE MANAGEMENT NOTE 04/27/2012  Patient:  Leah Ferrell, Leah Ferrell   Account Number:  [id number]  Date Initiated:  04/27/2012  Documentation initiated by:  GRAVES-BIGELOW,BRENDA  Subjective/Objective Assessment:   Pt admitted with cp and s/p cath.     Action/Plan:   CM will continue to monitor for disposition needs.   Anticipated DC Date:  04/29/2012   Anticipated DC Plan:  HOME/SELF CARE         Choice offered to / List presented to:             Status of service:  In process, will continue to follow Medicare Important Message given?   (If response is "NO", the following Medicare IM given date fields will be blank) Date Medicare IM given:   Date Additional Medicare IM given:    Discharge Disposition:    Per UR Regulation:  Reviewed for med. necessity/level of care/duration of stay  If discussed at Long Length of Stay Meetings, dates discussed:    Comments:

## 2012-04-27 NOTE — CV Procedure (Signed)
    Cardiac Cath Note  Leah Ferrell [id number] Jul 29, 1950  Procedure: Left  Heart Cardiac Catheterization Note ( cors only, no LVG) Indications: recurrent CP after stent placement last week.    Procedure Details Consent: Obtained Time Out: Verified patient identification, verified procedure, site/side was marked, verified correct patient position, special equipment/implants available, Radiology Safety Procedures followed,  medications/allergies/relevent history reviewed, required imaging and test results available.  Performed   Medications: Fentanyl: 50 mcg IV Versed: 2 mg IV  The right femoral artery was easily canulated using a modified Seldinger technique.  Hemodynamics:   LV - did not cross the AV, LVG performed last week Aortic pressure: 152/67  Angiography  J L 3.5 catheter used ( sub selected the LCX, could have used a JL3.0) Left Main: normal   Left anterior Descending: the proximal LAD stent is widely patent , no edge dissection.   Left Circumflex: normal.  There is a  Very small distal posterior lateral branch that has a tight stenosis.  This vessel is too small to be considered a candidate for PCI.  Right Coronary Artery: calcified, no stenosis  LV Gram:   Complications: No apparent complications Patient did tolerate procedure well.  Contrast used: 50 cc  Conclusions:   1. Patent LAD stent,  She has mild disease elsewhere.  Her symptoms could have come from a small PLSA.  The mild troponin elevation may be from last week's PCI.  Agree with Dr. Rosalyn Charters opinion to evaluate for PE.  Vesta Mixer, Montez Hageman., MD, [facility] 04/27/2012, 11:14 AM Office - [phone] Pager [phone]

## 2012-04-27 NOTE — ED Notes (Signed)
Patient called EMS due to chest pain which she claims is under her left breast.  EMS found her to be 100% RA, took 3 NTG at home.  Recently had a stent placed through her right radial to the LAD.  EMS adm 325mg  PO enroute.

## 2012-04-28 ENCOUNTER — Encounter (HOSPITAL_COMMUNITY): Payer: Self-pay | Admitting: Radiology

## 2012-04-28 ENCOUNTER — Inpatient Hospital Stay (HOSPITAL_COMMUNITY): Payer: 59

## 2012-04-28 DIAGNOSIS — I2510 Atherosclerotic heart disease of native coronary artery without angina pectoris: Secondary | ICD-10-CM | POA: Diagnosis present

## 2012-04-28 DIAGNOSIS — E785 Hyperlipidemia, unspecified: Secondary | ICD-10-CM

## 2012-04-28 LAB — CBC
MCH: 31.9 pg (ref 26.0–34.0)
MCHC: 34.3 g/dL (ref 30.0–36.0)
Platelets: 274 10*3/uL (ref 150–400)
RDW: 14.2 % (ref 11.5–15.5)

## 2012-04-28 LAB — GLUCOSE, CAPILLARY
Glucose-Capillary: 206 mg/dL — ABNORMAL HIGH (ref 70–99)
Glucose-Capillary: 214 mg/dL — ABNORMAL HIGH (ref 70–99)

## 2012-04-28 LAB — HEPARIN LEVEL (UNFRACTIONATED)
Heparin Unfractionated: 0.34 IU/mL (ref 0.30–0.70)
Heparin Unfractionated: 0.35 IU/mL (ref 0.30–0.70)

## 2012-04-28 MED ORDER — IOHEXOL 350 MG/ML SOLN
100.0000 mL | Freq: Once | INTRAVENOUS | Status: AC | PRN
  Administered 2012-04-28: 100 mL via INTRAVENOUS

## 2012-04-28 NOTE — Progress Notes (Signed)
PROGRESS NOTE  Subjective:   Leah Ferrell is a 62 yo with hx of CAD - s/p recent stenting of LAD.  Presented back to the hospital with recurrent CP and + Troponin levels.  Cath yesterday reveals patent LAD stent.  She has some other small branches with stenosis that may explain her CP .  Will get a CT angio to look for PE today.  CP with movement ( going to bathroom, no pleuretic cp)  Objective:    Vital Signs:   Temp:  [98 F (36.7 C)-98.2 F (36.8 C)] 98.2 F (36.8 C) (03/12 0438) Pulse Rate:  [66-78] 72 (03/12 0923) Resp:  [18] 18 (03/11 1613) BP: (124-150)/(43-62) 144/62 mmHg (03/12 0923) SpO2:  [97 %-100 %] 100 % (03/12 0438) Weight:  [207 lb 14.4 oz (94.303 kg)-211 lb 10.3 oz (96 kg)] 207 lb 14.4 oz (94.303 kg) (03/12 0438)  Last BM Date: 04/26/12   24-hour weight change: Weight change: -5.7 oz (-0.162 kg)  Weight trends: Filed Weights   04/27/12 0057 04/27/12 1613 04/28/12 0438  Weight: 212 lb (96.163 kg) 211 lb 10.3 oz (96 kg) 207 lb 14.4 oz (94.303 kg)    Intake/Output:        Physical Exam: BP 144/62  Pulse 72  Temp(Src) 98.2 F (36.8 C) (Oral)  Resp 18  Ht 5\' 2"  (1.575 m)  Wt 207 lb 14.4 oz (94.303 kg)  BMI 38.02 kg/m2  SpO2 100%  General: Vital signs reviewed and noted.   Head: Normocephalic, atraumatic.  Eyes: conjunctivae/corneas clear.  EOM's intact.   Throat: normal  Neck:  normal  Lungs:    clear  Heart:  RR, normal S1, S2  Abdomen:  Soft, non-tender, non-distended    Extremities: Cath site is ok   Neurologic: A&O X3, CN II - XII are grossly intact.   Psych: Normal     Labs: BMET:  Recent Labs  04/27/12 0150  NA 137  K 3.8  CL 99  GLUCOSE 171*  BUN 12  CREATININE 0.70    Liver function tests: No results found for this basename: AST, ALT, ALKPHOS, BILITOT, PROT, ALBUMIN,  in the last 72 hours No results found for this basename: LIPASE, AMYLASE,  in the last 72 hours  CBC:  Recent Labs  04/27/12 0133 04/27/12 0150  04/28/12 0420  WBC 16.1*  --  12.4*  HGB 13.5 13.9 12.6  HCT 38.8 41.0 36.7  MCV 91.3  --  92.9  PLT 290  --  274    Cardiac Enzymes:  Recent Labs  04/27/12 0421 04/27/12 1622 04/27/12 2202 04/28/12 0420  CKTOTAL 114  --   --   --   CKMB 3.7  --   --   --   TROPONINI 0.40* 0.35* 0.34* 0.38*    Coagulation Studies:  Recent Labs  04/27/12 0525  LABPROT 12.5  INR 0.94    Other: No components found with this basename: POCBNP,   Recent Labs  04/27/12 0925  DDIMER 0.36   No results found for this basename: HGBA1C,  in the last 72 hours No results found for this basename: CHOL, HDL, LDLCALC, TRIG, CHOLHDL,  in the last 72 hours No results found for this basename: TSH, T4TOTAL, FREET3, T3FREE, THYROIDAB,  in the last 72 hours No results found for this basename: VITAMINB12, FOLATE, FERRITIN, TIBC, IRON, RETICCTPCT,  in the last 72 hours   Other results:  Tele  : NSR  Medications:    Infusions: . sodium  chloride 250 mL (04/27/12 1131)  . heparin 1,350 Units/hr (04/28/12 0344)    Scheduled Medications: . amLODipine  5 mg Oral Daily  . aspirin  325 mg Oral Daily  . atorvastatin  40 mg Oral Daily  . insulin aspart  0-20 Units Subcutaneous TID WC  . insulin aspart  0-5 Units Subcutaneous QHS  . insulin aspart  6 Units Subcutaneous TID WC  . insulin glargine  20 Units Subcutaneous BID  . lisinopril  20 mg Oral BID  . losartan  100 mg Oral Daily  . metoprolol  50 mg Oral BID  . montelukast  10 mg Oral QHS  . Ticagrelor  90 mg Oral BID    Assessment/ Plan:   1. Recurrent CP - hx of resent stenting.  Cath yesterday looked OK.  Will get CT angio today to look for pulmonary embolus.  2, HTN:  Stable  3. Hyperlipidemia:   stable  Disposition: CT angio today Length of Stay: 1  Vesta Mixer, Montez Hageman., MD, [facility] 04/28/2012, 9:35 AM Office [phone] Pager [phone]

## 2012-04-28 NOTE — Progress Notes (Signed)
Inpatient Diabetes Program Recommendations  AACE/ADA: New Consensus Statement on Inpatient Glycemic Control (2013)  Target Ranges:  Prepandial:   less than 140 mg/dL      Peak postprandial:   less than 180 mg/dL (1-2 hours)      Critically ill patients:  140 - 180 mg/dL   Reason for Visit: Results for Leah Ferrell, Leah Ferrell (MRN [id number]) as of 04/28/2012 14:05  Ref. Range 04/27/2012 19:50 04/28/2012 07:45 04/28/2012 12:15  Glucose-Capillary Latest Range: 70-99 mg/dL 147 (H) 829 (H) 562 (H)   Note history of diabetes.  According to medication reconciliation, patient was on large doses of 70/30 prior to admission.  He was taking 70/30- 98 units tid with meals.  CBG's are now greater than goal.  Please check A1C to determine prehospitalization glycemic control.  Also consider increasing Lantus to 40 units bid while in the hospital.  May resume 70/30 regimen at discharge and have patient follow-up with PCP.

## 2012-04-28 NOTE — Progress Notes (Signed)
ANTICOAGULATION CONSULT NOTE  Pharmacy Consult for heparin  Indication: chest pain/ACS  No Known Allergies  Patient Measurements: Height: 5\' 2"  (157.5 cm) Weight: 211 lb 10.3 oz (96 kg) IBW/kg (Calculated) : 50.1 Heparin Dosing Weight: 73 kg  Vital Signs: Temp: 98.2 F (36.8 C) (03/11 1943) Temp src: Oral (03/11 1943) BP: 150/48 mmHg (03/11 1943) Pulse Rate: 72 (03/11 1943)  Labs:  Recent Labs  04/27/12 0133 04/27/12 0150 04/27/12 0421 04/27/12 0525 04/27/12 1622 04/27/12 2202 04/28/12 0039  HGB 13.5 13.9  --   --   --   --   --   HCT 38.8 41.0  --   --   --   --   --   PLT 290  --   --   --   --   --   --   LABPROT  --   --   --  12.5  --   --   --   INR  --   --   --  0.94  --   --   --   HEPARINUNFRC  --   --   --   --   --   --  0.20*  CREATININE  --  0.70  --   --   --   --   --   CKTOTAL  --   --  114  --   --   --   --   CKMB  --   --  3.7  --   --   --   --   TROPONINI  --   --  0.40*  --  0.35* 0.34*  --     Estimated Creatinine Clearance: 79.9 ml/min (by C-G formula based on Cr of 0.7).   Assessment: 62 yo female with possible PE for heparin  Goal of Therapy:  Heparin level 0.3-0.7 units/ml Monitor platelets by anticoagulation protocol: Yes   Plan:  Increase Heparin 1350 units/hr Check heparin level in 8 hours.  Geannie Risen, PharmD, BCPS

## 2012-04-28 NOTE — Progress Notes (Signed)
ANTICOAGULATION CONSULT NOTE  Pharmacy Consult for heparin  Indication: chest pain/ACS  No Known Allergies  Patient Measurements: Height: 5\' 2"  (157.5 cm) Weight: 207 lb 14.4 oz (94.303 kg) IBW/kg (Calculated) : 50.1 Heparin Dosing Weight: 73 kg  Vital Signs: Temp: 98.2 F (36.8 C) (03/12 0438) Temp src: Oral (03/12 0438) BP: 144/62 mmHg (03/12 0923) Pulse Rate: 72 (03/12 0923)  Labs:  Recent Labs  04/27/12 0133 04/27/12 0150  04/27/12 0421 04/27/12 0525 04/27/12 1622 04/27/12 2202 04/28/12 0039 04/28/12 0420 04/28/12 1034  HGB 13.5 13.9  --   --   --   --   --   --  12.6  --   HCT 38.8 41.0  --   --   --   --   --   --  36.7  --   PLT 290  --   --   --   --   --   --   --  274  --   LABPROT  --   --   --   --  12.5  --   --   --   --   --   INR  --   --   --   --  0.94  --   --   --   --   --   HEPARINUNFRC  --   --   --   --   --   --   --  0.20* 0.34 0.35  CREATININE  --  0.70  --   --   --   --   --   --   --   --   CKTOTAL  --   --   --  114  --   --   --   --   --   --   CKMB  --   --   --  3.7  --   --   --   --   --   --   TROPONINI  --   --   < > 0.40*  --  0.35* 0.34*  --  0.38*  --   < > = values in this interval not displayed.  Estimated Creatinine Clearance: 79 ml/min (by C-G formula based on Cr of 0.7).   Assessment: Heparin level = 0.35 is therapeutic on 1350 units/hr in this 62 yo female with possible PE. To get CT angio done today.  S/p cardiac cath yesterday revealed patent LAD stent. No bleeding reported.   Goal of Therapy:  Heparin level 0.3-0.7 units/ml Monitor platelets by anticoagulation protocol: Yes   Plan:  Continue IV Heparin 1350 units/hr Check heparin level and CBC daily in AM  Noah Delaine, RPh Clinical Pharmacist Pager: [phone]

## 2012-04-28 NOTE — Progress Notes (Signed)
CT angio was negative for PE.  Will ambulate. Anticipate DC tomorrow.  She appears to be stable.  Vesta Mixer, Montez Hageman., MD, [facility] 04/28/2012, 2:44 PM Office - [phone] Pager [phone]

## 2012-04-29 DIAGNOSIS — I517 Cardiomegaly: Secondary | ICD-10-CM

## 2012-04-29 LAB — CBC
Hemoglobin: 13.6 g/dL (ref 12.0–15.0)
RBC: 4.28 MIL/uL (ref 3.87–5.11)
WBC: 13.6 10*3/uL — ABNORMAL HIGH (ref 4.0–10.5)

## 2012-04-29 LAB — COMPREHENSIVE METABOLIC PANEL
CO2: 26 mEq/L (ref 19–32)
Calcium: 9.4 mg/dL (ref 8.4–10.5)
Creatinine, Ser: 0.56 mg/dL (ref 0.50–1.10)
GFR calc Af Amer: >90 mL/min (ref >90)
GFR calc non Af Amer: >90 mL/min (ref >90)
Glucose, Bld: 301 mg/dL — ABNORMAL HIGH (ref 70–99)

## 2012-04-29 LAB — URINALYSIS, ROUTINE W REFLEX MICROSCOPIC
Glucose, UA: 250 mg/dL — AB
Hgb urine dipstick: NEGATIVE
Ketones, ur: NEGATIVE mg/dL
Leukocytes, UA: NEGATIVE
Protein, ur: NEGATIVE mg/dL

## 2012-04-29 LAB — GLUCOSE, CAPILLARY
Glucose-Capillary: 241 mg/dL — ABNORMAL HIGH (ref 70–99)
Glucose-Capillary: 259 mg/dL — ABNORMAL HIGH (ref 70–99)
Glucose-Capillary: 322 mg/dL — ABNORMAL HIGH (ref 70–99)

## 2012-04-29 MED ORDER — MORPHINE SULFATE 2 MG/ML IJ SOLN: 1.0000 mg | Freq: Once | INTRAMUSCULAR | Status: AC | PRN

## 2012-04-29 MED ORDER — ASPIRIN EC 81 MG PO TBEC
81.0000 mg | DELAYED_RELEASE_TABLET | Freq: Every day | ORAL | Status: DC
  Administered 2012-04-30: 81 mg via ORAL
  Filled 2012-04-29: qty 1

## 2012-04-29 NOTE — Progress Notes (Signed)
Pt complaining of dull, mild, 3/10 chest pain while walking in room. Says it feels like a knot on left side of chest, under breast. Feels like prior chest pain when she had her heart attacks. EKG obtained. 1 SL nitro given with no changes. BP 155/52, HR 60's. 2L O2 applied. No relief of chest pain. MD paged. Duwaine Maxin, RN

## 2012-04-29 NOTE — Progress Notes (Signed)
Echocardiogram 2D Echocardiogram has been performed.  [name], Leah Ferrell, 12:51 PM

## 2012-04-29 NOTE — Progress Notes (Signed)
Called by nurse for recurrent CP. Pt was recently discharged s/p LAD stent 3/8 and readmitted 3/11 with pain, plateau enzyme trend with relook 3/11 cath showing patent stent. Dr. Elease Hashimoto noted her symptoms may have come from a small PLSA. CT angio neg for PE. WBC has also been elevated but improving by last CBC. Pain was 3/10, not changed by NTG but is now gradually improving at present as the patient is relaxing. EKG shows NSR with some convex ST segment change in V2 - this was reviewed with Dr. Riley Kill and not felt to be acute in nature. Otherwise no acute changes. Per our discussion, will continue to observe and give morphine if pain does not subside completely in the next several minutes, repeat troponins, and await echo results. Check CMET for LFTs. Check UA and f/u CBC in AM. Kriste Basque Dunn PA-C

## 2012-04-29 NOTE — Progress Notes (Signed)
Patient Name: Leah Ferrell Date of Encounter: 04/29/2012     Active Problems:   Hypertension   Hyperlipidemia   CAD (coronary artery disease)    SUBJECTIVE  No definite culprit.  She is stable.  Ambulating but not in hall.    CURRENT MEDS . amLODipine  5 mg Oral Daily  . aspirin  325 mg Oral Daily  . atorvastatin  40 mg Oral Daily  . insulin aspart  0-20 Units Subcutaneous TID WC  . insulin aspart  0-5 Units Subcutaneous QHS  . insulin aspart  6 Units Subcutaneous TID WC  . insulin glargine  20 Units Subcutaneous BID  . lisinopril  20 mg Oral BID  . losartan  100 mg Oral Daily  . metoprolol  50 mg Oral BID  . montelukast  10 mg Oral QHS  . Ticagrelor  90 mg Oral BID    OBJECTIVE  Filed Vitals:   04/28/12 0923 04/28/12 1457 04/28/12 2052 04/29/12 0459  BP: 144/62 161/60 151/57 136/24  Pulse: 72 70 74 69  Temp:  98.1 F (36.7 C) 98 F (36.7 C) 98.3 F (36.8 C)  TempSrc:  Oral Oral Oral  Resp:  18    Height:      Weight:    207 lb 8 oz (94.121 kg)  SpO2:  99% 98% 96%    Intake/Output Summary (Last 24 hours) at 04/29/12 0902 Last data filed at 04/28/12 1757  Gross per 24 hour  Intake    600 ml  Output      0 ml  Net    600 ml   Filed Weights   04/27/12 1613 04/28/12 0438 04/29/12 0459  Weight: 211 lb 10.3 oz (96 kg) 207 lb 14.4 oz (94.303 kg) 207 lb 8 oz (94.121 kg)    PHYSICAL EXAM  General: Pleasant, NAD. Neuro: Alert and oriented X 3. Moves all extremities spontaneously. Psych: Normal affect. HEENT:  Normal  Neck: Supple without bruits or JVD. Lungs:  Resp regular and unlabored, CTA. Heart: RRR no s3, s4, or murmurs. Groin ok Extremities: No clubbing, cyanosis or edema. DP/PT/Radials 2+ and equal bilaterally.  Accessory Clinical Findings  CBC  Recent Labs  04/27/12 0133 04/27/12 0150 04/28/12 0420  WBC 16.1*  --  12.4*  HGB 13.5 13.9 12.6  HCT 38.8 41.0 36.7  MCV 91.3  --  92.9  PLT 290  --  274   Basic Metabolic  Panel  Recent Labs  13/08/65 0150  NA 137  K 3.8  CL 99  GLUCOSE 171*  BUN 12  CREATININE 0.70   Liver Function Tests No results found for this basename: AST, ALT, ALKPHOS, BILITOT, PROT, ALBUMIN,  in the last 72 hours No results found for this basename: LIPASE, AMYLASE,  in the last 72 hours Cardiac Enzymes  Recent Labs  04/27/12 0421 04/27/12 1622 04/27/12 2202 04/28/12 0420  CKTOTAL 114  --   --   --   CKMB 3.7  --   --   --   TROPONINI 0.40* 0.35* 0.34* 0.38*   BNP No components found with this basename: POCBNP,  D-Dimer  Recent Labs  04/27/12 0925  DDIMER 0.36   Hemoglobin A1C No results found for this basename: HGBA1C,  in the last 72 hours Fasting Lipid Panel No results found for this basename: CHOL, HDL, LDLCALC, TRIG, CHOLHDL, LDLDIRECT,  in the last 72 hours Thyroid Function Tests No results found for this basename: TSH, T4TOTAL, FREET3, T3FREE, THYROIDAB,  in the  last 72 hours  TELE  Sinus  ECG  Will repeat today  Radiology/Studies  Ct Angio Chest Pe W/cm &/or Wo Cm  04/28/2012  *RADIOLOGY REPORT*  Clinical Data: Chest pain.  CT ANGIOGRAPHY CHEST  Technique:  Multidetector CT imaging of the chest using the standard protocol during bolus administration of intravenous contrast. Multiplanar reconstructed images including MIPs were obtained and reviewed to evaluate the vascular anatomy.  Contrast: OMNIPAQUE IOHEXOL 350 MG/ML SOLN  Comparison: Plain film chest 04/27/2012.  Findings: No pulmonary embolus is identified.  Calcific aortic and coronary artery disease is noted.  Stent in the LAD is seen.  There is cardiomegaly.  No pleural or pericardial effusion.  There is no axillary, hilar or mediastinal lymphadenopathy.  The lungs are clear.  Incidentally imaged upper abdomen demonstrates diffuse fatty infiltration of the visualized liver.  More focal fat is seen in the anterior aspect of the liver which is incompletely visualized.  No focal bony  abnormality is identified.  IMPRESSION:  1.  Negative for pulmonary embolus or acute cardiopulmonary disease. 2.  Cardiomegaly and atherosclerosis. 3.  Diffuse fatty infiltration of the liver.  More focal fat in the anterior aspect of the liver may be due to focal fatty deposition or less likely old liver laceration.   Original Report Authenticated By: Holley Dexter, M.D.    Dg Chest Portable 1 View  04/27/2012  *RADIOLOGY REPORT*  Clinical Data: Chest pain  PORTABLE CHEST - 1 VIEW  Comparison: 04/20/2012  Findings: Heart size upper normal to mildly enlarged.  Aortic atherosclerosis.  Interstitial prominence.  No confluent airspace opacity.  No pleural effusion or pneumothorax.  No acute osseous finding.  Bilateral acromioclavicular degenerative change.  IMPRESSION: Heart size upper normal to mildly enlarged.  Interstitial prominence may be chronic, or reflect interstitial edema versus atypical infection in the appropriate clinical setting.   Original Report Authenticated By: Jearld Lesch, M.D.     ASSESSMENT AND PLAN  Recurrent non STEMI following PCI  ? Etiology No evid of PE.   Will check 2 D echo today and recheck status.  Prob home am.     Signed, Shawnie Pons MD, [facility], [facility]

## 2012-04-30 DIAGNOSIS — I209 Angina pectoris, unspecified: Secondary | ICD-10-CM | POA: Diagnosis present

## 2012-04-30 DIAGNOSIS — R072 Precordial pain: Principal | ICD-10-CM

## 2012-04-30 LAB — CBC
HCT: 38 % (ref 36.0–46.0)
Hemoglobin: 12.9 g/dL (ref 12.0–15.0)
RDW: 14.1 % (ref 11.5–15.5)
WBC: 13.3 10*3/uL — ABNORMAL HIGH (ref 4.0–10.5)

## 2012-04-30 MED ORDER — ASPIRIN 81 MG PO TABS: 81.0000 mg | ORAL_TABLET | Freq: Every day | ORAL | Status: DC

## 2012-04-30 NOTE — Progress Notes (Signed)
Patient Name: Leah Ferrell Date of Encounter: 04/30/2012     Active Problems:   Hypertension   Hyperlipidemia   CAD (coronary artery disease)    SUBJECTIVE  Patient ambulated around the room all morning.  No chest pain.  Feels good.  Wants to go home.  Discussed with patient and son.  She will need to take it easy, and have early follow up in [place].   CURRENT MEDS . amLODipine  5 mg Oral Daily  . aspirin EC  81 mg Oral Daily  . atorvastatin  40 mg Oral Daily  . insulin aspart  0-20 Units Subcutaneous TID WC  . insulin aspart  0-5 Units Subcutaneous QHS  . insulin aspart  6 Units Subcutaneous TID WC  . insulin glargine  20 Units Subcutaneous BID  . lisinopril  20 mg Oral BID  . losartan  100 mg Oral Daily  . metoprolol  50 mg Oral BID  . montelukast  10 mg Oral QHS  . Ticagrelor  90 mg Oral BID    OBJECTIVE  Filed Vitals:   04/29/12 1800 04/29/12 2100 04/30/12 0621 04/30/12 0927  BP: 135/50 155/58 143/67 147/69  Pulse: 73 77 65 83  Temp: 98.2 F (36.8 C) 98.2 F (36.8 C) 98.4 F (36.9 C) 98.3 F (36.8 C)  TempSrc: Oral Oral Oral Oral  Resp: 18 20 18 18   Height:      Weight:   207 lb 3.7 oz (94 kg)   SpO2: 98% 98% 96% 97%    Intake/Output Summary (Last 24 hours) at 04/30/12 1421 Last data filed at 04/30/12 0900  Gross per 24 hour  Intake    360 ml  Output      0 ml  Net    360 ml   Filed Weights   04/28/12 0438 04/29/12 0459 04/30/12 0621  Weight: 207 lb 14.4 oz (94.303 kg) 207 lb 8 oz (94.121 kg) 207 lb 3.7 oz (94 kg)    PHYSICAL EXAM  General: Pleasant, NAD. Neuro: Alert and oriented X 3. Moves all extremities spontaneously. Psych: Normal affect. HEENT:  Normal  Neck: Supple without bruits or JVD. Lungs:  Resp regular and unlabored, CTA. Heart: RRR no s3, s4, or murmurs. Abdomen: groin looks ok.  No hematoma.    Extremities: No clubbing, cyanosis or edema. DP/PT/Radials 2+ and equal bilaterally.  Accessory Clinical Findings  CBC  Recent  Labs  04/29/12 1120 04/30/12 0615  WBC 13.6* 13.3*  HGB 13.6 12.9  HCT 39.5 38.0  MCV 92.3 90.0  PLT 263 276   Basic Metabolic Panel  Recent Labs  04/29/12 1120  NA 131*  K 4.2  CL 95*  CO2 26  GLUCOSE 301*  BUN 11  CREATININE 0.56  CALCIUM 9.4   Liver Function Tests  Recent Labs  04/29/12 1120  AST 30  ALT 56*  ALKPHOS 66  BILITOT 0.2*  PROT 7.6  ALBUMIN 3.1*   No results found for this basename: LIPASE, AMYLASE,  in the last 72 hours Cardiac Enzymes  Recent Labs  04/28/12 0420 04/29/12 1120 04/29/12 1756  TROPONINI 0.38* 0.33* 0.37*   BNP No components found with this basename: POCBNP,  D-Dimer No results found for this basename: DDIMER,  in the last 72 hours Hemoglobin A1C No results found for this basename: HGBA1C,  in the last 72 hours Fasting Lipid Panel No results found for this basename: CHOL, HDL, LDLCALC, TRIG, CHOLHDL, LDLDIRECT,  in the last 72 hours Thyroid Function Tests  No results found for this basename: TSH, T4TOTAL, FREET3, T3FREE, THYROIDAB,  in the last 72 hours    ECG  No acute changes yesterday.    Radiology/Studies  Ct Angio Chest Pe W/cm &/or Wo Cm  04/28/2012  *RADIOLOGY REPORT*  Clinical Data: Chest pain.  CT ANGIOGRAPHY CHEST  Technique:  Multidetector CT imaging of the chest using the standard protocol during bolus administration of intravenous contrast. Multiplanar reconstructed images including MIPs were obtained and reviewed to evaluate the vascular anatomy.  Contrast: OMNIPAQUE IOHEXOL 350 MG/ML SOLN  Comparison: Plain film chest 04/27/2012.  Findings: No pulmonary embolus is identified.  Calcific aortic and coronary artery disease is noted.  Stent in the LAD is seen.  There is cardiomegaly.  No pleural or pericardial effusion.  There is no axillary, hilar or mediastinal lymphadenopathy.  The lungs are clear.  Incidentally imaged upper abdomen demonstrates diffuse fatty infiltration of the visualized liver.  More  focal fat is seen in the anterior aspect of the liver which is incompletely visualized.  No focal bony abnormality is identified.  IMPRESSION:  1.  Negative for pulmonary embolus or acute cardiopulmonary disease. 2.  Cardiomegaly and atherosclerosis. 3.  Diffuse fatty infiltration of the liver.  More focal fat in the anterior aspect of the liver may be due to focal fatty deposition or less likely old liver laceration.   Original Report Authenticated By: Holley Dexter, M.D.    Dg Chest Portable 1 View  04/27/2012  *RADIOLOGY REPORT*  Clinical Data: Chest pain  PORTABLE CHEST - 1 VIEW  Comparison: 04/20/2012  Findings: Heart size upper normal to mildly enlarged.  Aortic atherosclerosis.  Interstitial prominence.  No confluent airspace opacity.  No pleural effusion or pneumothorax.  No acute osseous finding.  Bilateral acromioclavicular degenerative change.  IMPRESSION: Heart size upper normal to mildly enlarged.  Interstitial prominence may be chronic, or reflect interstitial edema versus atypical infection in the appropriate clinical setting.   Original Report Authenticated By: Jearld Lesch, M.D.     ASSESSMENT AND PLAN  1.  Recurrent chest pain with borderline trop  --- neg CTA for PE or dissection, repeat cath by Dr. Elease Hashimoto with no obvious culprit.  No findings on echo.  Will need to remain on DAPT with follow up to Dr.   Roosvelt Harps dc today. Troponins have been the same throughout, so I am not convinced she had a non STEMI.  They are identical.  She should have early follow up with Dr. Diona Browner.    Signed, Shawnie Pons MD, [facility], [facility]

## 2012-04-30 NOTE — Discharge Summary (Signed)
CARDIOLOGY DISCHARGE SUMMARY   Patient ID: Leah Ferrell MRN: [id number] DOB/AGE: 1950-06-01 62 y.o.  Admit date: 04/27/2012 Discharge date: 04/30/2012  Primary Discharge Diagnosis:   Precordial pain Secondary Discharge Diagnosis:    Hypertension   Hyperlipidemia   CAD (coronary artery disease)  Procedures: Left Heart Cardiac Catheterization, coronary angiogram, CT angiogram of the chest, 2-D echocardiogram  Hospital Course: Leah Ferrell is a 62 y.o. female with a history of CAD. She was hospitalized earlier in March and got a stent to the LAD on 04/23/2012. She had ruled in for a non-STEMI with elevation of her cardiac enzymes at [facility]. She was discharged on 04/24/2012. On 04/27/2012, she began having chest pain while at rest. She came to the DR where her initial point-of-care troponin was negative. She was admitted for further evaluation and treatment.  Her regular troponins were slightly elevated but there was no crescendo/decrescendo pattern. With early recurrence of chest pain, repeat catheterization was recommended and performed on 04/27/2012. The full results are below but she had no critical coronary artery disease and her LAD stent was patent. She had recurrent chest pain with exertion and there was some concern for small vessel disease but there was also concern for PE. A CT angiogram of the chest was performed. Full results are below but it showed no pulmonary embolus. She was hydrated after the procedures and her renal function remained stable. With the hydration, she had some mild hyponatremia and this can be followed as an outpatient. She had mild leukocytosis but this was decreased from her previous admission. Her BNP was within normal limits. A 2-D echocardiogram was checked, results below, but it showed no acute abnormalities.  By 04/30/2012, Leah Ferrell was pain-free with ambulation. She had no shortness of breath. She was feeling much better. She was  evaluated by Dr. Riley Kill and considered stable for discharge, to follow up as an outpatient in [place].  Labs:   Lab Results  Component Value Date   WBC 13.3* 04/30/2012   HGB 12.9 04/30/2012   HCT 38.0 04/30/2012   MCV 90.0 04/30/2012   PLT 276 04/30/2012     Recent Labs Lab 04/29/12 1120  NA 131*  K 4.2  CL 95*  CO2 26  BUN 11  CREATININE 0.56  CALCIUM 9.4  PROT 7.6  BILITOT 0.2*  ALKPHOS 66  ALT 56*  AST 30  GLUCOSE 301*    Recent Labs  04/28/12 0420 04/29/12 1120 04/29/12 1756  TROPONINI 0.38* 0.33* 0.37*   Pro B Natriuretic peptide (BNP)  Date/Time Value Range Status  04/27/2012  9:25 AM 25.0  0 - 125 pg/mL Final   Radiology: Ct Angio Chest Pe W/cm &/or Wo Cm 04/28/2012  *RADIOLOGY REPORT*  Clinical Data: Chest pain.  CT ANGIOGRAPHY CHEST  Technique:  Multidetector CT imaging of the chest using the standard protocol during bolus administration of intravenous contrast. Multiplanar reconstructed images including MIPs were obtained and reviewed to evaluate the vascular anatomy.  Contrast: OMNIPAQUE IOHEXOL 350 MG/ML SOLN  Comparison: Plain film chest 04/27/2012.  Findings: No pulmonary embolus is identified.  Calcific aortic and coronary artery disease is noted.  Stent in the LAD is seen.  There is cardiomegaly.  No pleural or pericardial effusion.  There is no axillary, hilar or mediastinal lymphadenopathy.  The lungs are clear.  Incidentally imaged upper abdomen demonstrates diffuse fatty infiltration of the visualized liver.  More focal fat is seen in the anterior aspect of the liver which is incompletely  visualized.  No focal bony abnormality is identified.  IMPRESSION:  1.  Negative for pulmonary embolus or acute cardiopulmonary disease. 2.  Cardiomegaly and atherosclerosis. 3.  Diffuse fatty infiltration of the liver.  More focal fat in the anterior aspect of the liver may be due to focal fatty deposition or less likely old liver laceration.   Original Report  Authenticated By: Holley Dexter, M.D.    Dg Chest Portable 1 View 04/27/2012  *RADIOLOGY REPORT*  Clinical Data: Chest pain  PORTABLE CHEST - 1 VIEW  Comparison: 04/20/2012  Findings: Heart size upper normal to mildly enlarged.  Aortic atherosclerosis.  Interstitial prominence.  No confluent airspace opacity.  No pleural effusion or pneumothorax.  No acute osseous finding.  Bilateral acromioclavicular degenerative change.  IMPRESSION: Heart size upper normal to mildly enlarged.  Interstitial prominence may be chronic, or reflect interstitial edema versus atypical infection in the appropriate clinical setting.   Original Report Authenticated By: Jearld Lesch, M.D.    Cardiac Cath: 04/27/2012 LV - did not cross the AV, LVG performed last week  Aortic pressure: 152/67  Angiography  J L 3.5 catheter used ( sub selected the LCX, could have used a JL3.0)  Left Main: normal  Left anterior Descending: the proximal LAD stent is widely patent , no edge dissection.  Left Circumflex: normal. There is a Very small distal posterior lateral branch that has a tight stenosis. This vessel is too small to be considered a candidate for PCI.  Right Coronary Artery: calcified, no stenosis  EKG: 29-Apr-2012 10:43:55   Normal sinus rhythm Normal ECG Since last tracing rate slower Vent. rate 65 BPM PR interval 164 ms QRS duration 84 ms QT/QTc 418/434 ms P-R-T axes 67 18 51  Echo: 04/29/2012 Conclusions - Left ventricle: The cavity size was normal. Wall thickness was increased in a pattern of mild LVH. Systolic function was normal. The estimated ejection fraction was in the range of 55% to 60%. Wall motion was normal; there were no regional wall motion abnormalities. Doppler parameters are consistent with abnormal left ventricular relaxation (grade 1 diastolic dysfunction). - Aortic valve: There was no stenosis. - Mitral valve: Mildly calcified annulus. Trivial regurgitation. - Left atrium: The atrium  was mildly dilated. - Right ventricle: The cavity size was normal. Systolic function was normal. - Pulmonary arteries: No complete TR doppler jet so unable to estimate PA systolic pressure. - Inferior vena cava: The vessel was normal in size; the respirophasic diameter changes were in the normal range (= 50%); findings are consistent with normal central venous pressure. Impressions: - Normal LV size with mild LV hypertrophy. EF 55-60%. Normal RV size and systolic function. No significant valvular abnormalities.  FOLLOW UP PLANS AND APPOINTMENTS No Known Allergies   Medication List    TAKE these medications       amLODipine 5 MG tablet  Commonly known as:  NORVASC  Take 1 tablet (5 mg total) by mouth daily.     aspirin 81 MG tablet  Take 1 tablet (81 mg total) by mouth daily.     atorvastatin 40 MG tablet  Commonly known as:  LIPITOR  Take 1 tablet (40 mg total) by mouth daily.     calcium carbonate 1250 MG tablet  Commonly known as:  OS-CAL - dosed in mg of elemental calcium  Take 1 tablet by mouth 2 (two) times daily.     cholecalciferol 1000 UNITS tablet  Commonly known as:  VITAMIN D  Take 1,000 Units by  mouth daily.     colesevelam 625 MG tablet  Commonly known as:  WELCHOL  Take 625 mg by mouth 3 (three) times daily.     fish oil-omega-3 fatty acids 1000 MG capsule  Take 1 g by mouth every morning.     furosemide 20 MG tablet  Commonly known as:  LASIX  Take 1 tablet (20 mg total) by mouth daily.     insulin aspart 100 UNIT/ML injection  Commonly known as:  novoLOG  Inject 5-20 Units into the skin 3 (three) times daily as needed for high blood sugar. For CBG greater than 250 take 5 units; for every 50 above that take an additional 5 units     insulin aspart protamine-insulin aspart (70-30) 100 UNIT/ML injection  Commonly known as:  NOVOLOG 70/30  Inject 98 Units into the skin 3 (three) times daily with meals.     lisinopril 20 MG tablet  Commonly known  as:  PRINIVIL,ZESTRIL  Take 1 tablet (20 mg total) by mouth 2 (two) times daily.     losartan 100 MG tablet  Commonly known as:  COZAAR  Take 1 tablet (100 mg total) by mouth daily.     metoprolol 50 MG tablet  Commonly known as:  LOPRESSOR  Take 50 mg by mouth 2 (two) times daily.     montelukast 10 MG tablet  Commonly known as:  SINGULAIR  Take 10 mg by mouth at bedtime.     multivitamin with minerals Tabs  Take 1 tablet by mouth daily.     nitroGLYCERIN 0.4 MG SL tablet  Commonly known as:  NITROSTAT  Place 1 tablet (0.4 mg total) under the tongue every 5 (five) minutes as needed for chest pain (up to 3 doses).     Ticagrelor 90 MG Tabs tablet  Commonly known as:  BRILINTA  Take 1 tablet (90 mg total) by mouth 2 (two) times daily.        Discharge Orders   Future Appointments [facility] Department Dept Phone   05/12/2012 2:00 PM Prescott Parma, PA-C [facility] (near [place]) [phone]   Future Orders Complete By Expires     Diet - low sodium heart healthy  As directed     Diet Carb Modified  As directed     Increase activity slowly  As directed       Follow-up Information   Follow up with SERPE, EUGENE, PA-C On 05/12/2012. (at 2:00 PM)    Contact information:   [address], [address] [place] [address] [phone]       BRING ALL MEDICATIONS WITH YOU TO FOLLOW UP APPOINTMENTS  Time spent with patient to include physician time: 38 min Signed: Theodore Demark, PA-C 04/30/2012, 8:03 PM Co-Sign MD   See my note.  She will need close follow up in [place].  Discussed at length.  See my notes.

## 2012-04-30 NOTE — Progress Notes (Signed)
Inpatient Diabetes Program Recommendations  AACE/ADA: New Consensus Statement on Inpatient Glycemic Control (2013)  Target Ranges:  Prepandial:   less than 140 mg/dL      Peak postprandial:   less than 180 mg/dL (1-2 hours)      Critically ill patients:  140 - 180 mg/dL   Reason for Visit: CBG's continue to be elevated.  At home patient was taking 70/30 98 units tid with meals.  Please increase Lantus to 40 units bid while in the hospital.

## 2012-05-03 LAB — GLUCOSE, CAPILLARY: Glucose-Capillary: 245 mg/dL — ABNORMAL HIGH (ref 70–99)

## 2012-05-04 ENCOUNTER — Telehealth: Payer: Self-pay | Admitting: Nurse Practitioner

## 2012-05-04 ENCOUNTER — Ambulatory Visit: Payer: Self-pay | Admitting: Physician Assistant

## 2012-05-04 DIAGNOSIS — IMO0002 Reserved for concepts with insufficient information to code with codable children: Secondary | ICD-10-CM

## 2012-05-04 DIAGNOSIS — E1165 Type 2 diabetes mellitus with hyperglycemia: Secondary | ICD-10-CM

## 2012-05-04 NOTE — Telephone Encounter (Signed)
Out of insulin

## 2012-05-04 NOTE — Telephone Encounter (Signed)
Patient on 2 types of insulin which one does he need

## 2012-05-04 NOTE — Telephone Encounter (Signed)
Needs  novolog hand written for mail order- novolog 70/30 (98 UNITS TID) And reg novolog- flex pen (PRN) And Test strip order written- freestyle- lite  Needs needle- 1/2 in- for free style pens  Al for 90day supply

## 2012-05-05 ENCOUNTER — Telehealth: Payer: Self-pay | Admitting: *Deleted

## 2012-05-05 MED ORDER — INSULIN ASPART 100 UNIT/ML ~~LOC~~ SOLN: 5.0000 [IU] | Freq: Three times a day (TID) | SUBCUTANEOUS | Status: DC | PRN

## 2012-05-05 MED ORDER — INSULIN ASPART PROT & ASPART (70-30 MIX) 100 UNIT/ML ~~LOC~~ SUSP: 98.0000 [IU] | Freq: Three times a day (TID) | SUBCUTANEOUS | Status: DC

## 2012-05-05 NOTE — Telephone Encounter (Signed)
Rxs up front ready to pick up. Pt notified 

## 2012-05-06 ENCOUNTER — Ambulatory Visit (INDEPENDENT_AMBULATORY_CARE_PROVIDER_SITE_OTHER): Payer: 59 | Admitting: General Practice

## 2012-05-06 ENCOUNTER — Other Ambulatory Visit: Payer: Self-pay | Admitting: General Practice

## 2012-05-06 ENCOUNTER — Encounter: Payer: Self-pay | Admitting: General Practice

## 2012-05-06 VITALS — BP 122/54 | HR 73 | Temp 97.3°F | Ht 62.0 in | Wt 204.0 lb

## 2012-05-06 DIAGNOSIS — R7309 Other abnormal glucose: Secondary | ICD-10-CM

## 2012-05-06 DIAGNOSIS — R739 Hyperglycemia, unspecified: Secondary | ICD-10-CM

## 2012-05-06 DIAGNOSIS — E785 Hyperlipidemia, unspecified: Secondary | ICD-10-CM

## 2012-05-06 DIAGNOSIS — E119 Type 2 diabetes mellitus without complications: Secondary | ICD-10-CM

## 2012-05-06 DIAGNOSIS — I10 Essential (primary) hypertension: Secondary | ICD-10-CM

## 2012-05-06 LAB — LIPID PANEL
LDL Cholesterol: 90 mg/dL (ref 0–99)
Total CHOL/HDL Ratio: 3.2 Ratio

## 2012-05-06 LAB — POCT GLYCOSYLATED HEMOGLOBIN (HGB A1C): Hemoglobin A1C: 8.2

## 2012-05-06 NOTE — Patient Instructions (Addendum)
Hypertension As your heart beats, it forces blood through your arteries. This force is your blood pressure. If the pressure is too high, it is called hypertension (HTN) or high blood pressure. HTN is dangerous because you may have it and not know it. High blood pressure may mean that your heart has to work harder to pump blood. Your arteries may be narrow or stiff. The extra work puts you at risk for heart disease, stroke, and other problems.  Blood pressure consists of two numbers, a higher number over a lower, 110/72, for example. It is stated as "110 over 72." The ideal is below 120 for the top number (systolic) and under 80 for the bottom (diastolic). Write down your blood pressure today. You should pay close attention to your blood pressure if you have certain conditions such as:  Heart failure.  Prior heart attack.  Diabetes  Chronic kidney disease.  Prior stroke.  Multiple risk factors for heart disease. To see if you have HTN, your blood pressure should be measured while you are seated with your arm held at the level of the heart. It should be measured at least twice. A one-time elevated blood pressure reading (especially in the Emergency Department) does not mean that you need treatment. There may be conditions in which the blood pressure is different between your right and left arms. It is important to see your caregiver soon for a recheck. Most people have essential hypertension which means that there is not a specific cause. This type of high blood pressure may be lowered by changing lifestyle factors such as:  Stress.  Smoking.  Lack of exercise.  Excessive weight.  Drug/tobacco/alcohol use.  Eating less salt. Most people do not have symptoms from high blood pressure until it has caused damage to the body. Effective treatment can often prevent, delay or reduce that damage. TREATMENT  When a cause has been identified, treatment for high blood pressure is directed at the  cause. There are a large number of medications to treat HTN. These fall into several categories, and your caregiver will help you select the medicines that are best for you. Medications may have side effects. You should review side effects with your caregiver. If your blood pressure stays high after you have made lifestyle changes or started on medicines,   Your medication(s) may need to be changed.  Other problems may need to be addressed.  Be certain you understand your prescriptions, and know how and when to take your medicine.  Be sure to follow up with your caregiver within the time frame advised (usually within two weeks) to have your blood pressure rechecked and to review your medications.  If you are taking more than one medicine to lower your blood pressure, make sure you know how and at what times they should be taken. Taking two medicines at the same time can result in blood pressure that is too low. SEEK IMMEDIATE MEDICAL CARE IF:  You develop a severe headache, blurred or changing vision, or confusion.  You have unusual weakness or numbness, or a faint feeling.  You have severe chest or abdominal pain, vomiting, or breathing problems. MAKE SURE YOU:   Understand these instructions.  Will watch your condition.  Will get help right away if you are not doing well or get worse. Document Released: 02/03/2005 Document Revised: 04/28/2011 Document Reviewed: 09/24/2007 [facility] Patient Information 2013 [place], [place].  Hyperglycemia Hyperglycemia occurs when the glucose (sugar) in your blood is too high. Hyperglycemia can  happen for many reasons, but it most often happens to people who do not know they have diabetes or are not managing their diabetes properly.  CAUSES  Whether you have diabetes or not, there are other causes of hyperglycemia. Hyperglycemia can occur when you have diabetes, but it can also occur in other situations that you might not be as aware of, such  as: Diabetes  If you have diabetes and are having problems controlling your blood glucose, hyperglycemia could occur because of some of the following reasons:  Not following your meal plan.  Not taking your diabetes medications or not taking it properly.  Exercising less or doing less activity than you normally do.  Being sick. Pre-diabetes  This cannot be ignored. Before people develop Type 2 diabetes, they almost always have "pre-diabetes." This is when your blood glucose levels are higher than normal, but not yet high enough to be diagnosed as diabetes. Research has shown that some long-term damage to the body, especially the heart and circulatory system, may already be occurring during pre-diabetes. If you take action to manage your blood glucose when you have pre-diabetes, you may delay or prevent Type 2 diabetes from developing. Stress  If you have diabetes, you may be "diet" controlled or on oral medications or insulin to control your diabetes. However, you may find that your blood glucose is higher than usual in the hospital whether you have diabetes or not. This is often referred to as "stress hyperglycemia." Stress can elevate your blood glucose. This happens because of hormones put out by the body during times of stress. If stress has been the cause of your high blood glucose, it can be followed regularly by your caregiver. That way he/she can make sure your hyperglycemia does not continue to get worse or progress to diabetes. Steroids  Steroids are medications that act on the infection fighting system (immune system) to block inflammation or infection. One side effect can be a rise in blood glucose. Most people can produce enough extra insulin to allow for this rise, but for those who cannot, steroids make blood glucose levels go even higher. It is not unusual for steroid treatments to "uncover" diabetes that is developing. It is not always possible to determine if the hyperglycemia  will go away after the steroids are stopped. A special blood test called an A1c is sometimes done to determine if your blood glucose was elevated before the steroids were started. SYMPTOMS  Thirsty.  Frequent urination.  Dry mouth.  Blurred vision.  Tired or fatigue.  Weakness.  Sleepy.  Tingling in feet or leg. DIAGNOSIS  Diagnosis is made by monitoring blood glucose in one or all of the following ways:  A1c test. This is a chemical found in your blood.  Fingerstick blood glucose monitoring.  Laboratory results. TREATMENT  First, knowing the cause of the hyperglycemia is important before the hyperglycemia can be treated. Treatment may include, but is not be limited to:  Education.  Change or adjustment in medications.  Change or adjustment in meal plan.  Treatment for an illness, infection, etc.  More frequent blood glucose monitoring.  Change in exercise plan.  Decreasing or stopping steroids.  Lifestyle changes. HOME CARE INSTRUCTIONS   Test your blood glucose as directed.  Exercise regularly. Your caregiver will give you instructions about exercise. Pre-diabetes or diabetes which comes on with stress is helped by exercising.  Eat wholesome, balanced meals. Eat often and at regular, fixed times. Your caregiver or nutritionist will  give you a meal plan to guide your sugar intake.  Being at an ideal weight is important. If needed, losing as little as 10 to 15 pounds may help improve blood glucose levels. SEEK MEDICAL CARE IF:   You have questions about medicine, activity, or diet.  You continue to have symptoms (problems such as increased thirst, urination, or weight gain). SEEK IMMEDIATE MEDICAL CARE IF:   You are vomiting or have diarrhea.  Your breath smells fruity.  You are breathing faster or slower.  You are very sleepy or incoherent.  You have numbness, tingling, or pain in your feet or hands.  You have chest pain.  Your symptoms get  worse even though you have been following your caregiver's orders.  If you have any other questions or concerns. Document Released: 07/30/2000 Document Revised: 04/28/2011 Document Reviewed: 06/02/2011 [facility] Patient Information 2013 [place], [place]. Hypercholesterolemia High Blood Cholesterol Cholesterol is a white, waxy, fat-like protein needed by your body in small amounts. The liver makes all the cholesterol you need. It is carried from the liver by the blood through the blood vessels. Deposits (plaque) may build up on blood vessel walls. This makes the arteries narrower and stiffer. Plaque increases the risk for heart attack and stroke. You cannot feel your cholesterol level even if it is very high. The only way to know is by a blood test to check your lipid (fats) levels. Once you know your cholesterol levels, you should keep a record of the test results. Work with your caregiver to to keep your levels in the desired range. WHAT THE RESULTS MEAN:  Total cholesterol is a rough measure of all the cholesterol in your blood.  LDL is the so-called bad cholesterol. This is the type that deposits cholesterol in the walls of the arteries. You want this level to be low.  HDL is the good cholesterol because it cleans the arteries and carries the LDL away. You want this level to be high.  Triglycerides are fat that the body can either burn for energy or store. High levels are closely linked to heart disease. DESIRED LEVELS:  Total cholesterol below 200.  LDL below 100 for people at risk, below 70 for very high risk.  HDL above 50 is good, above 60 is best.  Triglycerides below 150. HOW TO LOWER YOUR CHOLESTEROL:  Diet.  Choose fish or white meat chicken and [place], roasted or baked. Limit fatty cuts of red meat, fried foods, and processed meats, such as sausage and lunch meat.  Eat lots of fresh fruits and vegetables. Choose whole grains, beans, pasta, potatoes and cereals.  Use only  small amounts of olive, corn or canola oils. Avoid butter, mayonnaise, shortening or palm kernel oils. Avoid foods with trans-fats.  Use skim/nonfat milk and low-fat/nonfat yogurt and cheeses. Avoid whole milk, cream, ice cream, egg yolks and cheeses. Healthy desserts include angel food cake, gingersnaps, animal crackers, hard candy, popsicles, and low-fat/nonfat frozen yogurt. Avoid pastries, cakes, pies and cookies.  Exercise.  A regular program helps decrease LDL and raises HDL.  Helps with weight control.  Do things that increase your activity level like gardening, walking, or taking the stairs.  Medication.  May be prescribed by your caregiver to help lowering cholesterol and the risk for heart disease.  You may need medicine even if your levels are normal if you have several risk factors. HOME CARE INSTRUCTIONS   Follow your diet and exercise programs as suggested by your caregiver.  Take medications  as directed.  Have blood work done when your caregiver feels it is necessary. MAKE SURE YOU:   Understand these instructions.  Will watch your condition.  Will get help right away if you are not doing well or get worse. Document Released: 02/03/2005 Document Revised: 04/28/2011 Document Reviewed: 07/22/2006 [facility] Patient Information 2013 [place], [place].

## 2012-05-06 NOTE — Progress Notes (Signed)
Subjective:    Patient ID: Leah Ferrell, female    DOB: 11-14-1950, 62 y.o.   MRN: [id number]  Hypertension This is a chronic problem. The current episode started more than 1 year ago. The problem is unchanged. The problem is controlled. Pertinent negatives include no blurred vision, chest pain, headaches or shortness of breath. There are no associated agents to hypertension. Risk factors for coronary artery disease include diabetes mellitus, dyslipidemia, obesity, post-menopausal state and family history. Past treatments include beta blockers and calcium channel blockers. Compliance problems include diet and exercise.  Hypertensive end-organ damage includes CAD/MI.  Hyperlipidemia This is a chronic problem. The current episode started more than 1 year ago. Pertinent negatives include no chest pain or shortness of breath. Current antihyperlipidemic treatment includes statins. Compliance problems include adherence to diet and adherence to exercise.  Risk factors for coronary artery disease include diabetes mellitus, dyslipidemia, hypertension and post-menopausal.  Diabetes She presents for her follow-up diabetic visit. She has type 1 diabetes mellitus. No MedicAlert identification noted. The initial diagnosis of diabetes was made 15 years ago. Her disease course has been fluctuating. Pertinent negatives for hypoglycemia include no confusion, dizziness or headaches. Pertinent negatives for diabetes include no blurred vision, no chest pain, no polydipsia, no polyphagia and no polyuria. Symptoms are stable. Diabetic complications include peripheral neuropathy. Risk factors for coronary artery disease include diabetes mellitus, dyslipidemia, family history and hypertension. Current diabetic treatment includes insulin injections. She is compliant with treatment some of the time. She is currently taking insulin pre-breakfast, pre-lunch and pre-dinner. Insulin injections are given by patient. Rotation sites for  injection include the abdominal wall. Her weight is stable. She has not had a previous visit with a dietician. Her home blood glucose trend is fluctuating dramatically. An ACE inhibitor/angiotensin II receptor blocker is being taken. She does not see a podiatrist.Eye exam is current.   Patient reports that she was just released from hospital on Friday, due to mild heart attack. Spent two days in [facility], then home for two days, then went [facility]. Patient reports that she is currently trying to increase physical activity, eat a healthy diet (carb modified, low fat).  Patient reports that she monitors blood glucose twice a day, but forgot to bring diary today. Denies lesions or open areas to skin, and last eye exam was 2013.      Review of Systems  Constitutional: Negative for appetite change.  HENT: Negative for hearing loss.   Eyes: Negative for blurred vision and pain.  Respiratory: Negative for chest tightness and shortness of breath.   Cardiovascular: Negative for chest pain and leg swelling.  Gastrointestinal: Negative for abdominal pain, diarrhea, constipation and abdominal distention.  Endocrine: Negative for polydipsia, polyphagia and polyuria.  Genitourinary: Negative for dysuria, hematuria, vaginal bleeding and difficulty urinating.  Musculoskeletal: Negative for back pain.  Skin: Negative for rash.       Slight bilateral lower leg discoloration  Neurological: Negative for dizziness and headaches.       Tingling to bilateral lower legs and feet  Psychiatric/Behavioral: Negative.  Negative for confusion.       Objective:   Physical Exam  Constitutional: She is oriented to person, place, and time. She appears well-developed and well-nourished.  Eyes: EOM are normal. Pupils are equal, round, and reactive to light.  Neck: Normal range of motion.  Cardiovascular: Normal rate, regular rhythm, normal heart sounds and intact distal pulses.   Pulmonary/Chest:  Effort normal and breath sounds normal. No  respiratory distress. She has no wheezes. She has no rales.  Abdominal: Soft. Bowel sounds are normal. She exhibits no distension. There is no tenderness.  Musculoskeletal: Normal range of motion.  Neurological: She is alert and oriented to person, place, and time. She displays abnormal reflex.  Skin: Skin is warm and dry. No rash noted. No erythema.  Discoloration to bilateral lower legs  Psychiatric: She has a normal mood and affect.          Assessment & Plan:  Continue current medications Discussed diet and exercise with patient Labs pending HgbA1c, lipid panel F/u in 3 months  Raymon Mutton, FNP-C

## 2012-05-12 ENCOUNTER — Ambulatory Visit (INDEPENDENT_AMBULATORY_CARE_PROVIDER_SITE_OTHER): Payer: 59 | Admitting: Physician Assistant

## 2012-05-12 ENCOUNTER — Encounter: Payer: Self-pay | Admitting: Physician Assistant

## 2012-05-12 VITALS — BP 131/84 | HR 72 | Ht 62.5 in | Wt 205.8 lb

## 2012-05-12 DIAGNOSIS — E785 Hyperlipidemia, unspecified: Secondary | ICD-10-CM

## 2012-05-12 DIAGNOSIS — I10 Essential (primary) hypertension: Secondary | ICD-10-CM

## 2012-05-12 DIAGNOSIS — I739 Peripheral vascular disease, unspecified: Secondary | ICD-10-CM

## 2012-05-12 DIAGNOSIS — I2510 Atherosclerotic heart disease of native coronary artery without angina pectoris: Secondary | ICD-10-CM

## 2012-05-12 NOTE — Assessment & Plan Note (Signed)
Currently stable, status post recent presentation with HTN urgency.

## 2012-05-12 NOTE — Assessment & Plan Note (Signed)
Euvolemic by history and exam

## 2012-05-12 NOTE — Assessment & Plan Note (Signed)
Patient will be referred to Dr. Rachel Moulds further evaluation and recommendations.

## 2012-05-12 NOTE — Assessment & Plan Note (Signed)
Continue current dose statin and reassess lipid status in 12 weeks. Aggressive management recommended with target LDL 70 or less, if feasible. Recent LDL 82.

## 2012-05-12 NOTE — Progress Notes (Addendum)
Primary Cardiologist: Simona Huh, MD (new)   HPI: Post hospital followup from [facility], following 2 recent hospitalizations there. The initial one was after we saw her here at [facility] in consultation, after she presented with acute respiratory failure and acute, new onset diastolic heart failure. She ruled in for NSTEMI and, following stabilization, we arranged for her to transfer to [facility] for cardiac catheterization. Of note, prior to transfer we found evidence of severe PAD with lower extremity ABIs of 0. 49 right; 0.39 left.  Initial cardiac catheterization on March 7 revealed severe 1v CAD with 90% LAD stenosis, successfully treated with DES.. She then returned to [facility] four days later with recurrent CP. Troponins remained minimally elevated in the 0.3-0.4 range. She had a repeat catheterization, which yielded widely patent proximal LAD stent site, with no evidence of edge dissection. Moreover, a CTA of the chest was negative for pulmonary embolus.  She returns today reporting no further exertional CP or dyspnea. She notes palpable improvement, since undergoing PCI. She does, however, continue to have occasional left breast pain, which preceded her recent MI presentation here at [facility]. She suspects this may be related to a GI etiology.  She denies complications of the R groin incision site.  No Active Allergies  Current Outpatient Prescriptions  Medication Sig Dispense Refill  . amLODipine (NORVASC) 5 MG tablet Take 1 tablet (5 mg total) by mouth daily.  30 tablet  6  . aspirin 81 MG tablet Take 1 tablet (81 mg total) by mouth daily.      Marland Kitchen atorvastatin (LIPITOR) 40 MG tablet Take 1 tablet (40 mg total) by mouth daily.  30 tablet  6  . calcium carbonate (OS-CAL - DOSED IN MG OF ELEMENTAL CALCIUM) 1250 MG tablet Take 1 tablet by mouth 2 (two) times daily.      . cholecalciferol (VITAMIN D) 1000 UNITS tablet Take 1,000 Units by mouth daily.      . colesevelam (WELCHOL) 625 MG tablet Take 625  mg by mouth 3 (three) times daily.      . fish oil-omega-3 fatty acids 1000 MG capsule Take 1 g by mouth every morning.      . furosemide (LASIX) 20 MG tablet Take 1 tablet (20 mg total) by mouth daily.  30 tablet  6  . insulin aspart (NOVOLOG) 100 UNIT/ML injection Inject 5-20 Units into the skin 3 (three) times daily as needed for high blood sugar. For CBG greater than 250 take 5 units; for every 50 above that take an additional 5 units  1 vial  11  . insulin aspart protamine-insulin aspart (NOVOLOG 70/30) (70-30) 100 UNIT/ML injection Inject 98 Units into the skin 3 (three) times daily with meals.  10 mL  1  . lisinopril (PRINIVIL,ZESTRIL) 20 MG tablet Take 1 tablet (20 mg total) by mouth 2 (two) times daily.  60 tablet  6  . losartan (COZAAR) 100 MG tablet Take 1 tablet (100 mg total) by mouth daily.  30 tablet  6  . metoprolol (LOPRESSOR) 50 MG tablet Take 50 mg by mouth 2 (two) times daily.      . montelukast (SINGULAIR) 10 MG tablet Take 10 mg by mouth at bedtime.      . Multiple Vitamin (MULTIVITAMIN WITH MINERALS) TABS Take 1 tablet by mouth daily.      . nitroGLYCERIN (NITROSTAT) 0.4 MG SL tablet Place 1 tablet (0.4 mg total) under the tongue every 5 (five) minutes as needed for chest pain (up to 3  doses).  25 tablet  4  . Ticagrelor (BRILINTA) 90 MG TABS tablet Take 1 tablet (90 mg total) by mouth 2 (two) times daily.  60 tablet  10   No current facility-administered medications for this visit.    Past Medical History  Diagnosis Date  . Hypertension   . Diabetes mellitus without complication   . Hyperlipidemia   . CAD (coronary artery disease)     a. 04/2012 NSTEMI: s/p DES to LAD.  Marland Kitchen PVD (peripheral vascular disease)     a. 04/2012 ABI: R 0.49, L 0.39.  Marland Kitchen Chronic diastolic CHF (congestive heart failure)   . Leukocytosis   . Morbid obesity   . NSTEMI (non-ST elevated myocardial infarction) 04/27/2012  . Dysrhythmia   . Shortness of breath     Past Surgical History  Procedure  Laterality Date  . Tumor removal    . Lad stent      History   Social History  . Marital Status: Married    Spouse Name: N/A    Number of Children: N/A  . Years of Education: N/A   Occupational History  . Not on file.   Social History Main Topics  . Smoking status: Former Smoker    Quit date: 02/17/2001  . Smokeless tobacco: Former Neurosurgeon    Quit date: 04/20/2001  . Alcohol Use: No  . Drug Use: No  . Sexually Active: Not on file   Other Topics Concern  . Not on file   Social History Narrative  . No narrative on file    No family history on file.  ROS: no nausea, vomiting; no fever, chills; no melena, hematochezia; no claudication  PHYSICAL EXAM: BP 131/84  Pulse 72  Ht 5' 2.5" (1.588 m)  Wt 205 lb 12.8 oz (93.35 kg)  BMI 37.02 kg/m2 GENERAL: 62 year-old female, morbidly obese; NAD HEENT: NCAT, PERRLA, EOMI; sclera clear; no xanthelasma NECK: palpable bilateral carotid pulses, no bruits; no JVD; no TM LUNGS: CTA bilaterally CARDIAC: RRR (S1, S2); no significant murmurs; no rubs or gallops ABDOMEN: soft, protuberant EXTREMETIES: no significant peripheral edema SKIN: warm/dry; no obvious rash/lesions MUSCULOSKELETAL: no joint deformity NEURO: no focal deficit; NL affect   EKG:    ASSESSMENT & PLAN:  CAD (coronary artery disease) Quiescent on current medication regimen. Continue DAPT for at least one year.  Acute diastolic CHF (congestive heart failure) Euvolemic by history and exam  PVD (peripheral vascular disease) Patient will be referred to Dr. Rachel Moulds for further evaluation and recommendations.  Hyperlipidemia Continue current dose statin and reassess lipid status in 12 weeks. Aggressive management recommended with target LDL 70 or less, if feasible. Recent LDL 82.  Hypertension Currently stable, status post recent presentation with HTN urgency.    Gene Golden Emile, PAC

## 2012-05-12 NOTE — Assessment & Plan Note (Signed)
Quiescent on current medication regimen. Continue DAPT for at least one year.

## 2012-05-12 NOTE — Patient Instructions (Addendum)
   Referral to Dr. Kirke Corin for peripheral arterial disease Continue all current medications. Labs in 12 weeks for fasting lipid and liver panel - will mail reminder (Due around August 12, 2012) Follow up in  3 months

## 2012-05-21 ENCOUNTER — Telehealth: Payer: Self-pay | Admitting: Physician Assistant

## 2012-05-21 NOTE — Telephone Encounter (Signed)
Patient called wanting to speak with nurse in reference to her leg feeling numb

## 2012-05-21 NOTE — Telephone Encounter (Signed)
Returned call to patient.  States she has chest pain all the time, but feels different now.  States she has numbness down left arm and tingling in fingers that has been pretty constant over last 2-3 days.  States this is different since last OV.  Advised her to go to ED for evaluation in light of recent hospitalization (2 heart caths).  Also, advised her to go ahead & place one NTG under her tongue now.  Reminded her that she may feel a little lightheaded with this medication.  Patient verbalized understanding.

## 2012-05-25 ENCOUNTER — Encounter: Payer: Self-pay | Admitting: Cardiovascular Disease

## 2012-05-25 ENCOUNTER — Ambulatory Visit (INDEPENDENT_AMBULATORY_CARE_PROVIDER_SITE_OTHER): Payer: 59 | Admitting: Cardiovascular Disease

## 2012-05-25 VITALS — BP 121/60 | HR 68 | Ht 62.5 in | Wt 203.4 lb

## 2012-05-25 DIAGNOSIS — I739 Peripheral vascular disease, unspecified: Secondary | ICD-10-CM

## 2012-05-25 DIAGNOSIS — I2510 Atherosclerotic heart disease of native coronary artery without angina pectoris: Secondary | ICD-10-CM

## 2012-05-25 NOTE — Patient Instructions (Addendum)
Your physician recommends that you follow-up  With Dr. Kirke Corin on 07/06/2012 @ 10:00 am.  Your physician recommends that you continue on your current medications as directed. Please refer to the Current Medication list given to you today.

## 2012-05-27 ENCOUNTER — Encounter: Payer: Self-pay | Admitting: Cardiovascular Disease

## 2012-05-27 NOTE — Assessment & Plan Note (Addendum)
The patient has significant lifestyle limiting claudication involving both calves. ABI was significantly abnormal at 0.49 on the right side at 0.39 the left side. Due to severity of her symptoms, I recommend proceeding with abdominal aortogram, lower extremity trauma and possible angioplasty. The patient prefers to wait a few weeks before proceeding with this. In the meantime, continue current medications. I asked her to notify us if her symptoms worsen or if she develops rest pain. She is on appropriate medical therapy. We'll obtain lower extremity arterial duplex to localize her disease.

## 2012-05-27 NOTE — Progress Notes (Signed)
HPI  This is a pleasant 62 year old female who was referred by Gene Serpe for evaluation and management of peripheral arterial disease.  She had 2 recent hospitalizations at [facility]. The first was for non-ST elevation myocardial infarction. Cardiac catheterization in March showed 90% stenosis in the LAD. She underwent successful angioplasty and drug-eluting stent placement. She was readmitted for recurrent chest pain. Repeat cardiac catheterization showed no evidence of obstructive disease. The patient complained of bilateral calf pain with walking and had an ABI done which was abnormal at 0. 49 right; 0.39 left. She reports bilateral calf claudication which started about 5 years ago and has been progressive since then. Her symptoms are worse on the right side and the left side. The pain happens with 50 feet of walking and then she has to rest for at least 3 minutes before she can resume. There has been no rest pain or ulceration.  No Known Allergies   Current Outpatient Prescriptions on File Prior to Visit  Medication Sig Dispense Refill  . amLODipine (NORVASC) 5 MG tablet Take 1 tablet (5 mg total) by mouth daily.  30 tablet  6  . aspirin 81 MG tablet Take 1 tablet (81 mg total) by mouth daily.      Marland Kitchen atorvastatin (LIPITOR) 40 MG tablet Take 1 tablet (40 mg total) by mouth daily.  30 tablet  6  . calcium carbonate (OS-CAL - DOSED IN MG OF ELEMENTAL CALCIUM) 1250 MG tablet Take 1 tablet by mouth 2 (two) times daily.      . cholecalciferol (VITAMIN D) 1000 UNITS tablet Take 1,000 Units by mouth daily.      . colesevelam (WELCHOL) 625 MG tablet Take 625 mg by mouth 3 (three) times daily.      . fish oil-omega-3 fatty acids 1000 MG capsule Take 1 g by mouth every morning.      . furosemide (LASIX) 20 MG tablet Take 1 tablet (20 mg total) by mouth daily.  30 tablet  6  . insulin aspart (NOVOLOG) 100 UNIT/ML injection Inject 5-20 Units into the skin 3 (three) times daily as needed for high  blood sugar. For CBG greater than 250 take 5 units; for every 50 above that take an additional 5 units  1 vial  11  . insulin aspart protamine-insulin aspart (NOVOLOG 70/30) (70-30) 100 UNIT/ML injection Inject 98 Units into the skin 3 (three) times daily with meals.  10 mL  1  . lisinopril (PRINIVIL,ZESTRIL) 20 MG tablet Take 1 tablet (20 mg total) by mouth 2 (two) times daily.  60 tablet  6  . losartan (COZAAR) 100 MG tablet Take 1 tablet (100 mg total) by mouth daily.  30 tablet  6  . metoprolol (LOPRESSOR) 50 MG tablet Take 50 mg by mouth 2 (two) times daily.      . montelukast (SINGULAIR) 10 MG tablet Take 10 mg by mouth at bedtime.      . nitroGLYCERIN (NITROSTAT) 0.4 MG SL tablet Place 1 tablet (0.4 mg total) under the tongue every 5 (five) minutes as needed for chest pain (up to 3 doses).  25 tablet  4  . Ticagrelor (BRILINTA) 90 MG TABS tablet Take 1 tablet (90 mg total) by mouth 2 (two) times daily.  60 tablet  10  . Multiple Vitamin (MULTIVITAMIN WITH MINERALS) TABS Take 1 tablet by mouth daily.       No current facility-administered medications on file prior to visit.     Past Medical  History  Diagnosis Date  . Hypertension   . Diabetes mellitus without complication   . Hyperlipidemia   . CAD (coronary artery disease)     a. 04/2012 NSTEMI: s/p DES to LAD.  Marland Kitchen PVD (peripheral vascular disease)     a. 04/2012 ABI: R 0.49, L 0.39.  Marland Kitchen Chronic diastolic CHF (congestive heart failure)   . Leukocytosis   . Morbid obesity   . NSTEMI (non-ST elevated myocardial infarction) 04/27/2012  . Dysrhythmia   . Shortness of breath      Past Surgical History  Procedure Laterality Date  . Tumor removal    . Lad stent       No family history on file.   History   Social History  . Marital Status: Married    Spouse Name: N/A    Number of Children: N/A  . Years of Education: N/A   Occupational History  . Not on file.   Social History Main Topics  . Smoking status: Former Smoker     Quit date: 02/17/2001  . Smokeless tobacco: Former Neurosurgeon    Quit date: 04/20/2001  . Alcohol Use: No  . Drug Use: No  . Sexually Active: Not on file   Other Topics Concern  . Not on file   Social History Narrative  . No narrative on file     ROS Constitutional: Negative for fever, chills, diaphoresis, activity change, appetite change and fatigue.  HENT: Negative for hearing loss, nosebleeds, congestion, sore throat, facial swelling, drooling, trouble swallowing, neck pain, voice change, sinus pressure and tinnitus.  Eyes: Negative for photophobia, pain, discharge and visual disturbance.  Respiratory: Negative for apnea, cough, chest tightness, shortness of breath and wheezing.  Cardiovascular: Negative for chest pain, palpitations and leg swelling.  Gastrointestinal: Negative for nausea, vomiting, abdominal pain, diarrhea, constipation, blood in stool and abdominal distention.  Genitourinary: Negative for dysuria, urgency, frequency, hematuria and decreased urine volume.  Skin: Negative for color change, pallor, rash and wound.  Neurological: Negative for dizziness, tremors, seizures, syncope, speech difficulty, weakness, light-headedness, numbness and headaches.  Psychiatric/Behavioral: Negative for suicidal ideas, hallucinations, behavioral problems and agitation. The patient is not nervous/anxious.     PHYSICAL EXAM   BP 121/60  Pulse 68  Ht 5' 2.5" (1.588 m)  Wt 203 lb 6.4 oz (92.262 kg)  BMI 36.59 kg/m2 Constitutional: She is oriented to person, place, and time. She appears well-developed and well-nourished. No distress.  HENT: No nasal discharge.  Head: Normocephalic and atraumatic.  Eyes: Pupils are equal and round. Right eye exhibits no discharge. Left eye exhibits no discharge.  Neck: Normal range of motion. Neck supple. No JVD present. No thyromegaly present.  Cardiovascular: Normal rate, regular rhythm, normal heart sounds. Exam reveals no gallop and no  friction rub. No murmur heard.  Pulmonary/Chest: Effort normal and breath sounds normal. No stridor. No respiratory distress. She has no wheezes. She has no rales. She exhibits no tenderness.  Abdominal: Soft. Bowel sounds are normal. She exhibits no distension. There is no tenderness. There is no rebound and no guarding.  Musculoskeletal: Normal range of motion. She exhibits no edema and no tenderness.  Neurological: She is alert and oriented to person, place, and time. Coordination normal.  Skin: Skin is warm and dry. No rash noted. She is not diaphoretic. No erythema. No pallor.  Psychiatric: She has a normal mood and affect. Her behavior is normal. Judgment and thought content normal.  Vascular: Femoral pulses slightly diminished on the right  side and normal on the left side. Distal pulses are not palpable. There is dependent rubor.      ASSESSMENT AND PLAN

## 2012-05-27 NOTE — Assessment & Plan Note (Signed)
She has no symptoms of angina at present time.

## 2012-05-28 ENCOUNTER — Telehealth: Payer: Self-pay | Admitting: *Deleted

## 2012-05-28 DIAGNOSIS — I739 Peripheral vascular disease, unspecified: Secondary | ICD-10-CM

## 2012-05-28 NOTE — Telephone Encounter (Signed)
Patient notified.  Will forward to [facility] for scheduling.

## 2012-05-28 NOTE — Telephone Encounter (Signed)
Message copied by Lesle Chris on Fri May 28, 2012 11:11 AM ------      Message from: Lorine Bears A      Created: Thu May 27, 2012  2:56 PM       I saw this patient recently in the [facility] clinic. I want her to have lower extremity arterial duplex for evaluation. She had an ABI done recently at [facility]. Let her know that this will help [place] before angiography.      Schedule the test in our [place] office. ------

## 2012-05-31 ENCOUNTER — Other Ambulatory Visit: Payer: Self-pay | Admitting: *Deleted

## 2012-05-31 MED ORDER — GLUCOSE BLOOD VI STRP: ORAL_STRIP | Status: DC

## 2012-06-08 ENCOUNTER — Telehealth: Payer: Self-pay | Admitting: *Deleted

## 2012-06-08 NOTE — Telephone Encounter (Signed)
HCTZ doesn't work like that . It is not indicated for swelling. Lasix is what is used for swelling.

## 2012-06-08 NOTE — Telephone Encounter (Signed)
Wants hctz12 .5 to have on hand, used to take lisinopril- hctz combo- but now only has lisinopril plain. She likes to use this prn with a lot of swelling.

## 2012-06-09 MED ORDER — FUROSEMIDE 20 MG PO TABS: 20.0000 mg | ORAL_TABLET | Freq: Every day | ORAL | Status: DC

## 2012-06-09 NOTE — Telephone Encounter (Signed)
Rx called in 

## 2012-06-09 NOTE — Telephone Encounter (Signed)
Needs it called into pharmacy

## 2012-06-09 NOTE — Telephone Encounter (Signed)
Patient is already on lasix according to chart

## 2012-06-09 NOTE — Telephone Encounter (Signed)
Okay she said can we please call that one into the [facility] in [place]

## 2012-06-09 NOTE — Telephone Encounter (Signed)
Pt aware.

## 2012-06-10 ENCOUNTER — Encounter (INDEPENDENT_AMBULATORY_CARE_PROVIDER_SITE_OTHER): Payer: 59

## 2012-06-10 DIAGNOSIS — I739 Peripheral vascular disease, unspecified: Secondary | ICD-10-CM

## 2012-06-18 ENCOUNTER — Telehealth: Payer: Self-pay | Admitting: *Deleted

## 2012-06-18 NOTE — Telephone Encounter (Signed)
Notes Recorded by Lesle Chris, LPN on 02/22/1094 at 2:05 PM Patient and daughter notified. ------  Notes Recorded by Lesle Chris, LPN on 0/45/4098 at 9:02 AM Left message to return call.   OV with Dr. Kirke Corin scheduled for 5/20 in [place] office.  Routine cardiac OV scheduled for June with SM.

## 2012-06-18 NOTE — Telephone Encounter (Signed)
Message copied by Lesle Chris on Fri Jun 18, 2012  2:05 PM ------      Message from: Lorine Bears A      Created: Tue Jun 15, 2012  5:31 PM       There is significant PAD as expected. Will discuss during follow up. ------

## 2012-06-21 ENCOUNTER — Other Ambulatory Visit: Payer: Self-pay | Admitting: *Deleted

## 2012-06-21 MED ORDER — INSULIN ASPART PROT & ASPART (70-30 MIX) 100 UNIT/ML PEN: 98.0000 [IU] | PEN_INJECTOR | Freq: Three times a day (TID) | SUBCUTANEOUS | Status: DC

## 2012-06-28 ENCOUNTER — Telehealth: Payer: Self-pay | Admitting: Nurse Practitioner

## 2012-06-28 NOTE — Telephone Encounter (Signed)
He is on novolog and novolog 70/30. Whicxh does he need

## 2012-07-01 ENCOUNTER — Other Ambulatory Visit: Payer: Self-pay

## 2012-07-01 MED ORDER — LISINOPRIL-HYDROCHLOROTHIAZIDE 20-12.5 MG PO TABS: 1.0000 | ORAL_TABLET | Freq: Every day | ORAL | Status: DC

## 2012-07-01 MED ORDER — COLESEVELAM HCL 625 MG PO TABS: ORAL_TABLET | ORAL | Status: DC

## 2012-07-01 MED ORDER — GLUCOSE BLOOD VI STRP: ORAL_STRIP | Status: DC

## 2012-07-01 NOTE — Telephone Encounter (Signed)
Last seen 05/06/12  Last labs 02/06/12  Print RX's for mail order and have call patient to pick up

## 2012-07-01 NOTE — Telephone Encounter (Signed)
Pts meds nov 70/30 and pen needles were called in and varified at [facility]

## 2012-07-02 ENCOUNTER — Telehealth: Payer: Self-pay | Admitting: *Deleted

## 2012-07-02 NOTE — Telephone Encounter (Signed)
RX's for Welchol,      lisin-hctz,    freestyle test strips  At front. Pt aware.

## 2012-07-06 ENCOUNTER — Encounter: Payer: Self-pay | Admitting: Cardiovascular Disease

## 2012-07-06 ENCOUNTER — Encounter: Payer: Self-pay | Admitting: *Deleted

## 2012-07-06 ENCOUNTER — Ambulatory Visit (INDEPENDENT_AMBULATORY_CARE_PROVIDER_SITE_OTHER): Payer: 59 | Admitting: Cardiovascular Disease

## 2012-07-06 VITALS — BP 140/64 | HR 57 | Ht 62.0 in | Wt 214.0 lb

## 2012-07-06 DIAGNOSIS — I739 Peripheral vascular disease, unspecified: Secondary | ICD-10-CM

## 2012-07-06 DIAGNOSIS — I2510 Atherosclerotic heart disease of native coronary artery without angina pectoris: Secondary | ICD-10-CM

## 2012-07-06 LAB — CBC WITH DIFFERENTIAL/PLATELET
Eosinophils Relative: 0.7 % (ref 0.0–5.0)
HCT: 41.3 % (ref 36.0–46.0)
Hemoglobin: 13.8 g/dL (ref 12.0–15.0)
Lymphs Abs: 3.7 10*3/uL (ref 0.7–4.0)
MCV: 92.4 fl (ref 78.0–100.0)
Monocytes Absolute: 0.8 10*3/uL (ref 0.1–1.0)
Monocytes Relative: 5 % (ref 3.0–12.0)
Neutro Abs: 10.8 10*3/uL — ABNORMAL HIGH (ref 1.4–7.7)
Platelets: 353 10*3/uL (ref 150.0–400.0)
WBC: 15.4 10*3/uL — ABNORMAL HIGH (ref 4.5–10.5)

## 2012-07-06 LAB — BASIC METABOLIC PANEL
BUN: 8 mg/dL (ref 6–23)
CO2: 32 mEq/L (ref 19–32)
Calcium: 9.4 mg/dL (ref 8.4–10.5)
Creatinine, Ser: 0.6 mg/dL (ref 0.4–1.2)
GFR: 105.91 mL/min (ref >60.00)
Glucose, Bld: 86 mg/dL (ref 70–99)
Sodium: 138 mEq/L (ref 135–145)

## 2012-07-06 LAB — APTT: aPTT: 31 s — ABNORMAL HIGH (ref 21.7–28.8)

## 2012-07-06 NOTE — Assessment & Plan Note (Signed)
The patient continues to have lifestyle limiting claudication in both sides. ABI was moderately reduced bilaterally slightly worse on the left side. The right distal SFA was found to be occluded with reconstitution in the popliteal artery. On the left side, there was evidence of possible inflow disease. However, the study was overall challenging. Due to that, I recommend proceeding with lower extremity angiography and possible endovascular intervention. Risks, benefits and alternatives were discussed with the patient. I will plan access in the left femoral artery.

## 2012-07-06 NOTE — Assessment & Plan Note (Signed)
No recurrent chest pain. Continue medical therapy. 

## 2012-07-06 NOTE — Progress Notes (Signed)
HPI  This is a pleasant 62 year old female who was referred by Gene Serpe for evaluation and management of peripheral arterial disease.  She had 2 recent hospitalizations at [facility]. The first was for non-ST elevation myocardial infarction. Cardiac catheterization in March showed 90% stenosis in the LAD. She underwent successful angioplasty and drug-eluting stent placement. She was readmitted for recurrent chest pain. Repeat cardiac catheterization showed no evidence of obstructive disease. The patient complained of bilateral calf pain with walking and had an ABI done which was abnormal at 0. 49 right; 0.39 left. She reports bilateral calf claudication which started about 5 years ago and has been progressive since then. Her symptoms are worse on the right side and the left side. The pain happens with 50 feet of walking and then she has to rest for at least 3 minutes before she can resume. There has been no rest pain or ulceration. This has been very frustrating as she is usually very active. There has been no improvement in symptoms recently with medical therapy. She has occasional numbness at rest.  No Known Allergies   Current Outpatient Prescriptions on File Prior to Visit  Medication Sig Dispense Refill  . amLODipine (NORVASC) 5 MG tablet Take 1 tablet (5 mg total) by mouth daily.  30 tablet  6  . aspirin 81 MG tablet Take 1 tablet (81 mg total) by mouth daily.      Marland Kitchen atorvastatin (LIPITOR) 40 MG tablet Take 1 tablet (40 mg total) by mouth daily.  30 tablet  6  . calcium carbonate (OS-CAL - DOSED IN MG OF ELEMENTAL CALCIUM) 1250 MG tablet Take 1 tablet by mouth 2 (two) times daily.      . cholecalciferol (VITAMIN D) 1000 UNITS tablet Take 1,000 Units by mouth daily.      . colesevelam (WELCHOL) 625 MG tablet 3 tabs BID  450 tablet  0  . fish oil-omega-3 fatty acids 1000 MG capsule Take 1 g by mouth every morning.      . furosemide (LASIX) 20 MG tablet Take 1 tablet (20 mg total) by mouth  daily.  30 tablet  6  . glucose blood (FREESTYLE TEST STRIPS) test strip Use as instructed  100 each  1  . insulin aspart (NOVOLOG) 100 UNIT/ML injection Inject 5-20 Units into the skin 3 (three) times daily as needed for high blood sugar. For CBG greater than 250 take 5 units; for every 50 above that take an additional 5 units  1 vial  11  . Insulin Aspart Prot & Aspart (NOVOLOG MIX 70/30 FLEXPEN) (70-30) 100 UNIT/ML SUPN Inject 98 Units into the skin 3 (three) times daily.  90 pen  0  . insulin aspart protamine-insulin aspart (NOVOLOG 70/30) (70-30) 100 UNIT/ML injection Inject 98 Units into the skin 3 (three) times daily with meals.  10 mL  1  . lisinopril (PRINIVIL,ZESTRIL) 20 MG tablet Take 1 tablet (20 mg total) by mouth 2 (two) times daily.  60 tablet  6  . lisinopril-hydrochlorothiazide (PRINZIDE,ZESTORETIC) 20-12.5 MG per tablet Take 1 tablet by mouth daily.  90 tablet  0  . losartan (COZAAR) 100 MG tablet Take 1 tablet (100 mg total) by mouth daily.  30 tablet  6  . metoprolol (LOPRESSOR) 50 MG tablet Take 50 mg by mouth 2 (two) times daily.      . montelukast (SINGULAIR) 10 MG tablet Take 10 mg by mouth at bedtime.      . Multiple Vitamin (MULTIVITAMIN WITH  MINERALS) TABS Take 1 tablet by mouth daily.      . nitroGLYCERIN (NITROSTAT) 0.4 MG SL tablet Place 1 tablet (0.4 mg total) under the tongue every 5 (five) minutes as needed for chest pain (up to 3 doses).  25 tablet  4  . Ticagrelor (BRILINTA) 90 MG TABS tablet Take 1 tablet (90 mg total) by mouth 2 (two) times daily.  60 tablet  10   No current facility-administered medications on file prior to visit.     Past Medical History  Diagnosis Date  . Hypertension   . Diabetes mellitus without complication   . Hyperlipidemia   . CAD (coronary artery disease)     a. 04/2012 NSTEMI: s/p DES to LAD.  Marland Kitchen PVD (peripheral vascular disease)     a. 04/2012 ABI: R 0.49, L 0.39.  Marland Kitchen Chronic diastolic CHF (congestive heart failure)   .  Leukocytosis   . Morbid obesity   . NSTEMI (non-ST elevated myocardial infarction) 04/27/2012  . Dysrhythmia   . Shortness of breath      Past Surgical History  Procedure Laterality Date  . Tumor removal    . Lad stent       No family history on file.   History   Social History  . Marital Status: Married    Spouse Name: N/A    Number of Children: N/A  . Years of Education: N/A   Occupational History  . Not on file.   Social History Main Topics  . Smoking status: Former Smoker    Quit date: 02/17/2001  . Smokeless tobacco: Former Neurosurgeon    Quit date: 04/20/2001  . Alcohol Use: No  . Drug Use: No  . Sexually Active: Not on file   Other Topics Concern  . Not on file   Social History Narrative  . No narrative on file     ROS Constitutional: Negative for fever, chills, diaphoresis, activity change, appetite change and fatigue.  HENT: Negative for hearing loss, nosebleeds, congestion, sore throat, facial swelling, drooling, trouble swallowing, neck pain, voice change, sinus pressure and tinnitus.  Eyes: Negative for photophobia, pain, discharge and visual disturbance.  Respiratory: Negative for apnea, cough, chest tightness, shortness of breath and wheezing.  Cardiovascular: Negative for chest pain, palpitations and leg swelling.  Gastrointestinal: Negative for nausea, vomiting, abdominal pain, diarrhea, constipation, blood in stool and abdominal distention.  Genitourinary: Negative for dysuria, urgency, frequency, hematuria and decreased urine volume.  Skin: Negative for color change, pallor, rash and wound.  Neurological: Negative for dizziness, tremors, seizures, syncope, speech difficulty, weakness, light-headedness, numbness and headaches.  Psychiatric/Behavioral: Negative for suicidal ideas, hallucinations, behavioral problems and agitation. The patient is not nervous/anxious.     PHYSICAL EXAM   BP 140/64  Pulse 57  Ht 5\' 2"  (1.575 m)  Wt 214 lb (97.07  kg)  BMI 39.13 kg/m2  SpO2 97% Constitutional: She is oriented to person, place, and time. She appears well-developed and well-nourished. No distress.  HENT: No nasal discharge.  Head: Normocephalic and atraumatic.  Eyes: Pupils are equal and round. Right eye exhibits no discharge. Left eye exhibits no discharge.  Neck: Normal range of motion. Neck supple. No JVD present. No thyromegaly present.  Cardiovascular: Normal rate, regular rhythm, normal heart sounds. Exam reveals no gallop and no friction rub. No murmur heard.  Pulmonary/Chest: Effort normal and breath sounds normal. No stridor. No respiratory distress. She has no wheezes. She has no rales. She exhibits no tenderness.  Abdominal: Soft. Bowel  sounds are normal. She exhibits no distension. There is no tenderness. There is no rebound and no guarding.  Musculoskeletal: Normal range of motion. She exhibits no edema and no tenderness.  Neurological: She is alert and oriented to person, place, and time. Coordination normal.  Skin: Skin is warm and dry. No rash noted. She is not diaphoretic. No erythema. No pallor.  Psychiatric: She has a normal mood and affect. Her behavior is normal. Judgment and thought content normal.  Vascular: Femoral pulses slightly diminished bilaterally. Distal pulses are not palpable. There is dependent rubor.      ASSESSMENT AND PLAN

## 2012-07-07 ENCOUNTER — Encounter (HOSPITAL_COMMUNITY): Payer: Self-pay | Admitting: Pharmacist

## 2012-07-07 ENCOUNTER — Other Ambulatory Visit: Payer: Self-pay

## 2012-07-07 MED ORDER — GLUCOSE BLOOD VI STRP: ORAL_STRIP | Status: DC

## 2012-07-07 MED ORDER — LISINOPRIL-HYDROCHLOROTHIAZIDE 20-25 MG PO TABS: 1.0000 | ORAL_TABLET | Freq: Every day | ORAL | Status: DC

## 2012-07-07 MED ORDER — COLESEVELAM HCL 625 MG PO TABS: 625.0000 mg | ORAL_TABLET | Freq: Three times a day (TID) | ORAL | Status: DC

## 2012-07-07 NOTE — Telephone Encounter (Signed)
Last seen 02/05/12   Print and have nurse call patient to pick up for mail order

## 2012-07-08 ENCOUNTER — Telehealth: Payer: Self-pay | Admitting: Nurse Practitioner

## 2012-07-08 ENCOUNTER — Telehealth: Payer: Self-pay | Admitting: *Deleted

## 2012-07-08 NOTE — Telephone Encounter (Signed)
APT MADE 

## 2012-07-08 NOTE — Telephone Encounter (Addendum)
Patient c/o right wrist hurting, states that she has been doing a lot of packing/unpacking.  States not sure if she has pulled muscle or not.  Advised her to see PMD for this issue as this has been several weeks since procedure.  Possibly r/t moving.  Patient verbalized understanding.

## 2012-07-09 ENCOUNTER — Ambulatory Visit (INDEPENDENT_AMBULATORY_CARE_PROVIDER_SITE_OTHER): Payer: 59 | Admitting: Physician Assistant

## 2012-07-09 ENCOUNTER — Encounter: Payer: Self-pay | Admitting: Physician Assistant

## 2012-07-09 ENCOUNTER — Ambulatory Visit (INDEPENDENT_AMBULATORY_CARE_PROVIDER_SITE_OTHER): Payer: 59

## 2012-07-09 ENCOUNTER — Other Ambulatory Visit: Payer: Self-pay | Admitting: *Deleted

## 2012-07-09 VITALS — BP 153/60 | HR 72 | Temp 97.4°F | Ht 62.0 in | Wt 204.6 lb

## 2012-07-09 DIAGNOSIS — M25539 Pain in unspecified wrist: Secondary | ICD-10-CM

## 2012-07-09 DIAGNOSIS — M25531 Pain in right wrist: Secondary | ICD-10-CM

## 2012-07-09 NOTE — Progress Notes (Signed)
Subjective:     Patient ID: Leah Ferrell, female   DOB: March 26, 1950, 62 y.o.   MRN: [id number]  HPI Pt with R wrist pain after cleaning and moving to a new house Sx are to the R wrist Most of the sx run along the R thumb to the wrist Movement makes the sx worse Denies chronic hx of same No meds tried  Review of Systems  All other systems reviewed and are negative.       Objective:   Physical Exam  Nursing note and vitals reviewed. No ecchy/edema to the thumb wrist FROM wrist- increase in sx with flexion/ext Good grip strength Pulses/sensory good + TTP dorsal thumb, palmar aspect wrist Xray- neg fx, will be over-read     Assessment:     1. Wrist pain, right        Plan:     OTC NSAIDS Heat/Ice Minimize activities Topical meds prn F/U prn

## 2012-07-09 NOTE — Patient Instructions (Signed)
Wrist Pain  Wrist injuries are frequent in adults and children. A sprain is an injury to the ligaments that hold your bones together. A strain is an injury to muscle or muscle cord-like structures (tendons) from stretching or pulling. Generally, when wrists are moderately tender to touch following a fall or injury, a break in the bone (fracture) may be present. Most wrist sprains or strains are better in 3 to 5 days, but complete healing may take several weeks.  HOME CARE INSTRUCTIONS    Put ice on the injured area.   Put ice in a plastic bag.   Place a towel between your skin and the bag.   Leave the ice on for 15-20 minutes, 3-4 times a day, for the first 2 days.   Keep your arm raised above the level of your heart whenever possible to reduce swelling and pain.   Rest the injured area for at least 48 hours or as directed by your caregiver.   If a splint or elastic bandage has been applied, use it for as long as directed by your caregiver or until seen by a caregiver for a follow-up exam.   Only take over-the-counter or prescription medicines for pain, discomfort, or fever as directed by your caregiver.   Keep all follow-up appointments. You may need to follow up with a specialist or have follow-up X-rays. Improvement in pain level is not a guarantee that you did not fracture a bone in your wrist. The only way to determine whether or not you have a broken bone is by X-ray.  SEEK IMMEDIATE MEDICAL CARE IF:    Your fingers are swollen, very red, white, or cold and blue.   Your fingers are numb or tingling.   You have increasing pain.   You have difficulty moving your fingers.  MAKE SURE YOU:    Understand these instructions.   Will watch your condition.   Will get help right away if you are not doing well or get worse.  Document Released: 11/13/2004 Document Revised: 04/28/2011 Document Reviewed: 03/27/2010  [employer] Patient Information 2014 [employer]

## 2012-07-13 ENCOUNTER — Telehealth: Payer: Self-pay | Admitting: Cardiovascular Disease

## 2012-07-13 NOTE — Telephone Encounter (Signed)
Patient has no one available to transport or care for her after the scheduled procedure tomorrow, May 28th. She is wanting to reschedule for next Wednesday, June 4th. Appointment rescheduled for June 4th for 1:30pm. Patient notified and agreed to plan and new schedule.

## 2012-07-13 NOTE — Telephone Encounter (Signed)
New problem   Pt need to postpone her sx for tomorrow. Don't have anyone to bring her. Please call pt.

## 2012-07-14 ENCOUNTER — Telehealth: Payer: Self-pay

## 2012-07-14 NOTE — Telephone Encounter (Signed)
Pt daughter called and states that we need to call her mom's # [phone]

## 2012-07-14 NOTE — Telephone Encounter (Signed)
LMTCB [id number] procedure time on 6/4 to 1030 versus 1:30

## 2012-07-14 NOTE — Telephone Encounter (Signed)
Pt informed Understanding verb Changed time to 1030 start time with pt arriving at 0830

## 2012-07-15 ENCOUNTER — Other Ambulatory Visit: Payer: Self-pay

## 2012-07-15 MED ORDER — PRAVASTATIN SODIUM 80 MG PO TABS: 80.0000 mg | ORAL_TABLET | Freq: Every day | ORAL | Status: DC

## 2012-07-15 NOTE — Telephone Encounter (Signed)
x

## 2012-07-16 ENCOUNTER — Telehealth: Payer: Self-pay | Admitting: General Practice

## 2012-07-16 MED ORDER — MELOXICAM 15 MG PO TABS: 15.0000 mg | ORAL_TABLET | Freq: Every day | ORAL | Status: DC

## 2012-07-16 NOTE — Telephone Encounter (Signed)
We can do a rx of Mobic 15mg  1 po qd #10 if she wants Hold any OTC NSAIDS while on med

## 2012-07-16 NOTE — Telephone Encounter (Signed)
wlw to address

## 2012-07-20 ENCOUNTER — Other Ambulatory Visit: Payer: Self-pay | Admitting: General Practice

## 2012-07-20 ENCOUNTER — Telehealth: Payer: Self-pay | Admitting: General Practice

## 2012-07-20 MED ORDER — MONTELUKAST SODIUM 10 MG PO TABS: 10.0000 mg | ORAL_TABLET | Freq: Every day | ORAL | Status: DC

## 2012-07-20 NOTE — Telephone Encounter (Signed)
Montelukast prescription printed. thx

## 2012-07-21 ENCOUNTER — Ambulatory Visit (HOSPITAL_COMMUNITY)
Admission: RE | Admit: 2012-07-21 | Discharge: 2012-07-21 | Disposition: A | Discharge status: Home / Self Care | Payer: 59 | Source: Ambulatory Visit | Attending: Cardiovascular Disease | Admitting: Cardiovascular Disease

## 2012-07-21 ENCOUNTER — Encounter (HOSPITAL_COMMUNITY)
Admission: RE | Disposition: A | Discharge status: Home / Self Care | Payer: Self-pay | Source: Ambulatory Visit | Attending: Cardiovascular Disease

## 2012-07-21 DIAGNOSIS — I70219 Atherosclerosis of native arteries of extremities with intermittent claudication, unspecified extremity: Secondary | ICD-10-CM

## 2012-07-21 DIAGNOSIS — Z794 Long term (current) use of insulin: Secondary | ICD-10-CM | POA: Insufficient documentation

## 2012-07-21 DIAGNOSIS — I509 Heart failure, unspecified: Secondary | ICD-10-CM | POA: Insufficient documentation

## 2012-07-21 DIAGNOSIS — I739 Peripheral vascular disease, unspecified: Secondary | ICD-10-CM

## 2012-07-21 DIAGNOSIS — Z6839 Body mass index (BMI) 39.0-39.9, adult: Secondary | ICD-10-CM | POA: Insufficient documentation

## 2012-07-21 DIAGNOSIS — I708 Atherosclerosis of other arteries: Secondary | ICD-10-CM | POA: Insufficient documentation

## 2012-07-21 DIAGNOSIS — Z87891 Personal history of nicotine dependence: Secondary | ICD-10-CM | POA: Insufficient documentation

## 2012-07-21 DIAGNOSIS — Z7982 Long term (current) use of aspirin: Secondary | ICD-10-CM | POA: Insufficient documentation

## 2012-07-21 DIAGNOSIS — I252 Old myocardial infarction: Secondary | ICD-10-CM | POA: Insufficient documentation

## 2012-07-21 DIAGNOSIS — Z9861 Coronary angioplasty status: Secondary | ICD-10-CM | POA: Insufficient documentation

## 2012-07-21 DIAGNOSIS — I7092 Chronic total occlusion of artery of the extremities: Secondary | ICD-10-CM | POA: Insufficient documentation

## 2012-07-21 DIAGNOSIS — Z79899 Other long term (current) drug therapy: Secondary | ICD-10-CM | POA: Insufficient documentation

## 2012-07-21 DIAGNOSIS — E119 Type 2 diabetes mellitus without complications: Secondary | ICD-10-CM | POA: Insufficient documentation

## 2012-07-21 DIAGNOSIS — E785 Hyperlipidemia, unspecified: Secondary | ICD-10-CM | POA: Insufficient documentation

## 2012-07-21 DIAGNOSIS — I700 Atherosclerosis of aorta: Secondary | ICD-10-CM | POA: Insufficient documentation

## 2012-07-21 DIAGNOSIS — I5032 Chronic diastolic (congestive) heart failure: Secondary | ICD-10-CM | POA: Insufficient documentation

## 2012-07-21 DIAGNOSIS — I2510 Atherosclerotic heart disease of native coronary artery without angina pectoris: Secondary | ICD-10-CM | POA: Insufficient documentation

## 2012-07-21 DIAGNOSIS — I499 Cardiac arrhythmia, unspecified: Secondary | ICD-10-CM | POA: Insufficient documentation

## 2012-07-21 DIAGNOSIS — I10 Essential (primary) hypertension: Secondary | ICD-10-CM | POA: Insufficient documentation

## 2012-07-21 HISTORY — PX: ABDOMINAL AORTAGRAM: SHX5454

## 2012-07-21 LAB — GLUCOSE, CAPILLARY

## 2012-07-21 SURGERY — ABDOMINAL AORTAGRAM
Anesthesia: LOCAL

## 2012-07-21 MED ORDER — HEPARIN (PORCINE) IN NACL 2-0.9 UNIT/ML-% IJ SOLN
INTRAMUSCULAR | Status: AC
  Filled 2012-07-21: qty 500

## 2012-07-21 MED ORDER — LIDOCAINE HCL (PF) 1 % IJ SOLN
INTRAMUSCULAR | Status: AC
  Filled 2012-07-21: qty 30

## 2012-07-21 MED ORDER — SODIUM CHLORIDE 0.9 % IV SOLN: INTRAVENOUS | Status: DC

## 2012-07-21 MED ORDER — MIDAZOLAM HCL 2 MG/2ML IJ SOLN
INTRAMUSCULAR | Status: AC
  Filled 2012-07-21: qty 2

## 2012-07-21 MED ORDER — SODIUM CHLORIDE 0.9 % IV SOLN: INTRAVENOUS | Status: AC

## 2012-07-21 MED ORDER — FENTANYL CITRATE 0.05 MG/ML IJ SOLN
INTRAMUSCULAR | Status: AC
  Filled 2012-07-21: qty 2

## 2012-07-21 MED ORDER — SODIUM CHLORIDE 0.9 % IV SOLN: 250.0000 mL | INTRAVENOUS | Status: DC | PRN

## 2012-07-21 MED ORDER — SODIUM CHLORIDE 0.9 % IJ SOLN: 3.0000 mL | Freq: Two times a day (BID) | INTRAMUSCULAR | Status: DC

## 2012-07-21 MED ORDER — ACETAMINOPHEN 325 MG PO TABS: 650.0000 mg | ORAL_TABLET | ORAL | Status: DC | PRN

## 2012-07-21 MED ORDER — SODIUM CHLORIDE 0.9 % IJ SOLN: 3.0000 mL | INTRAMUSCULAR | Status: DC | PRN

## 2012-07-21 NOTE — Progress Notes (Signed)
CLIENT VOIDED WHEN UP TO BATHROOM

## 2012-07-21 NOTE — H&P (View-Only) (Signed)
  HPI  This is a pleasant 62-year-old female who was referred by Gene Serpe for evaluation and management of peripheral arterial disease.  She had 2 recent hospitalizations at [place]. The first was for non-ST elevation myocardial infarction. Cardiac catheterization in March showed 90% stenosis in the LAD. She underwent successful angioplasty and drug-eluting stent placement. She was readmitted for recurrent chest pain. Repeat cardiac catheterization showed no evidence of obstructive disease. The patient complained of bilateral calf pain with walking and had an ABI done which was abnormal at 0. 49 right; 0.39 left. She reports bilateral calf claudication which started about 5 years ago and has been progressive since then. Her symptoms are worse on the right side and the left side. The pain happens with 50 feet of walking and then she has to rest for at least 3 minutes before she can resume. There has been no rest pain or ulceration. This has been very frustrating as she is usually very active. There has been no improvement in symptoms recently with medical therapy. She has occasional numbness at rest.  No Known Allergies   Current Outpatient Prescriptions on File Prior to Visit  Medication Sig Dispense Refill  . amLODipine (NORVASC) 5 MG tablet Take 1 tablet (5 mg total) by mouth daily.  30 tablet  6  . aspirin 81 MG tablet Take 1 tablet (81 mg total) by mouth daily.      . atorvastatin (LIPITOR) 40 MG tablet Take 1 tablet (40 mg total) by mouth daily.  30 tablet  6  . calcium carbonate (OS-CAL - DOSED IN MG OF ELEMENTAL CALCIUM) 1250 MG tablet Take 1 tablet by mouth 2 (two) times daily.      . cholecalciferol (VITAMIN D) 1000 UNITS tablet Take 1,000 Units by mouth daily.      . colesevelam (WELCHOL) 625 MG tablet 3 tabs BID  450 tablet  0  . fish oil-omega-3 fatty acids 1000 MG capsule Take 1 g by mouth every morning.      . furosemide (LASIX) 20 MG tablet Take 1 tablet (20 mg total) by mouth  daily.  30 tablet  6  . glucose blood (FREESTYLE TEST STRIPS) test strip Use as instructed  100 each  1  . insulin aspart (NOVOLOG) 100 UNIT/ML injection Inject 5-20 Units into the skin 3 (three) times daily as needed for high blood sugar. For CBG greater than 250 take 5 units; for every 50 above that take an additional 5 units  1 vial  11  . Insulin Aspart Prot & Aspart (NOVOLOG MIX 70/30 FLEXPEN) (70-30) 100 UNIT/ML SUPN Inject 98 Units into the skin 3 (three) times daily.  90 pen  0  . insulin aspart protamine-insulin aspart (NOVOLOG 70/30) (70-30) 100 UNIT/ML injection Inject 98 Units into the skin 3 (three) times daily with meals.  10 mL  1  . lisinopril (PRINIVIL,ZESTRIL) 20 MG tablet Take 1 tablet (20 mg total) by mouth 2 (two) times daily.  60 tablet  6  . lisinopril-hydrochlorothiazide (PRINZIDE,ZESTORETIC) 20-12.5 MG per tablet Take 1 tablet by mouth daily.  90 tablet  0  . losartan (COZAAR) 100 MG tablet Take 1 tablet (100 mg total) by mouth daily.  30 tablet  6  . metoprolol (LOPRESSOR) 50 MG tablet Take 50 mg by mouth 2 (two) times daily.      . montelukast (SINGULAIR) 10 MG tablet Take 10 mg by mouth at bedtime.      . Multiple Vitamin (MULTIVITAMIN WITH   MINERALS) TABS Take 1 tablet by mouth daily.      . nitroGLYCERIN (NITROSTAT) 0.4 MG SL tablet Place 1 tablet (0.4 mg total) under the tongue every 5 (five) minutes as needed for chest pain (up to 3 doses).  25 tablet  4  . Ticagrelor (BRILINTA) 90 MG TABS tablet Take 1 tablet (90 mg total) by mouth 2 (two) times daily.  60 tablet  10   No current facility-administered medications on file prior to visit.     Past Medical History  Diagnosis Date  . Hypertension   . Diabetes mellitus without complication   . Hyperlipidemia   . CAD (coronary artery disease)     a. 04/2012 NSTEMI: s/p DES to LAD.  . PVD (peripheral vascular disease)     a. 04/2012 ABI: R 0.49, L 0.39.  . Chronic diastolic CHF (congestive heart failure)   .  Leukocytosis   . Morbid obesity   . NSTEMI (non-ST elevated myocardial infarction) 04/27/2012  . Dysrhythmia   . Shortness of breath      Past Surgical History  Procedure Laterality Date  . Tumor removal    . Lad stent       No family history on file.   History   Social History  . Marital Status: Married    Spouse Name: N/A    Number of Children: N/A  . Years of Education: N/A   Occupational History  . Not on file.   Social History Main Topics  . Smoking status: Former Smoker    Quit date: 02/17/2001  . Smokeless tobacco: Former User    Quit date: 04/20/2001  . Alcohol Use: No  . Drug Use: No  . Sexually Active: Not on file   Other Topics Concern  . Not on file   Social History Narrative  . No narrative on file     ROS Constitutional: Negative for fever, chills, diaphoresis, activity change, appetite change and fatigue.  HENT: Negative for hearing loss, nosebleeds, congestion, sore throat, facial swelling, drooling, trouble swallowing, neck pain, voice change, sinus pressure and tinnitus.  Eyes: Negative for photophobia, pain, discharge and visual disturbance.  Respiratory: Negative for apnea, cough, chest tightness, shortness of breath and wheezing.  Cardiovascular: Negative for chest pain, palpitations and leg swelling.  Gastrointestinal: Negative for nausea, vomiting, abdominal pain, diarrhea, constipation, blood in stool and abdominal distention.  Genitourinary: Negative for dysuria, urgency, frequency, hematuria and decreased urine volume.  Skin: Negative for color change, pallor, rash and wound.  Neurological: Negative for dizziness, tremors, seizures, syncope, speech difficulty, weakness, light-headedness, numbness and headaches.  Psychiatric/Behavioral: Negative for suicidal ideas, hallucinations, behavioral problems and agitation. The patient is not nervous/anxious.     PHYSICAL EXAM   BP 140/64  Pulse 57  Ht 5' 2" (1.575 m)  Wt 214 lb (97.07  kg)  BMI 39.13 kg/m2  SpO2 97% Constitutional: She is oriented to person, place, and time. She appears well-developed and well-nourished. No distress.  HENT: No nasal discharge.  Head: Normocephalic and atraumatic.  Eyes: Pupils are equal and round. Right eye exhibits no discharge. Left eye exhibits no discharge.  Neck: Normal range of motion. Neck supple. No JVD present. No thyromegaly present.  Cardiovascular: Normal rate, regular rhythm, normal heart sounds. Exam reveals no gallop and no friction rub. No murmur heard.  Pulmonary/Chest: Effort normal and breath sounds normal. No stridor. No respiratory distress. She has no wheezes. She has no rales. She exhibits no tenderness.  Abdominal: Soft. Bowel   sounds are normal. She exhibits no distension. There is no tenderness. There is no rebound and no guarding.  Musculoskeletal: Normal range of motion. She exhibits no edema and no tenderness.  Neurological: She is alert and oriented to person, place, and time. Coordination normal.  Skin: Skin is warm and dry. No rash noted. She is not diaphoretic. No erythema. No pallor.  Psychiatric: She has a normal mood and affect. Her behavior is normal. Judgment and thought content normal.  Vascular: Femoral pulses slightly diminished bilaterally. Distal pulses are not palpable. There is dependent rubor.      ASSESSMENT AND PLAN   

## 2012-07-21 NOTE — CV Procedure (Signed)
PERIPHERAL VASCULAR PROCEDURE  NAME:  Lavergne Hiltunen   MRN: [id number] DOB:  07/06/1950   ADMIT DATE: 07/21/2012  Performing Cardiologist: Lorine Bears Primary Physician: Coralie Keens, FNP  Procedures Performed:  Abdominal Aortic Angiogram   Selective right lower extremity angiography with runoff  Selective left lower extremity angiography with runoff.   Indication(s):   Claudication   Consent: The procedure with Risks/Benefits/Alternatives and Indications was reviewed with the patient .  All questions were answered.  Medications:  Sedation:  1 mg IV Versed, 25 mcg IV Fentanyl  Contrast:  107 mL  Visipaque   Procedural details: The left groin was prepped, draped, and anesthetized with 1% lidocaine. Using modified Seldinger technique, a micropuncture sheath was placed and then exchanged into a fight femoral sheath. A 5 Fr Short Pigtail Catheter was advanced of over a  Versicore wire into the descending Aorta to a level just above the renal arteries. A power injection of 57ml/sec contrast over 1 sec was performed for Abdominal Aortic Angiography.  The catheter was then pulled back to a level just above the Aortic bifurcation, and a second power injection was performed to evaluate the iliac arteries.   The pigtail catheter was changed over the Versicore wire for A crossover catheter which was then pulled back the aortic bifurcation and the wire was advanced down the contralateral common iliac artery.  The wire was then advanced to the contralateral common femoral artery, the catheter was exchanged into an end hole straight tip catheter which was advanced over the wire to the common femoral artery. Contralateral second-order right lower extremity angiography was performed via power injection of 5 ml / sec contrast for a total of 35 ml.  The catheter was then pulled back to the left external iliac artery and ipsilateral artery angiography was performed via power injection of 5   ml/sec contrast for a total of 35 ml.    The sheath was left in place to be removed manually. The patient tolerated the procedure well with no immediate complications.    Hemodynamics:  Central Aortic Pressure / Mean Aortic Pressure: 157/70  Findings:  Abdominal aorta: Normal in size with no aneurysm. There is mild atherosclerosis below the renal arteries. Distally above the iliac bifurcation there is a calcified plaque at the origin of the left external iliac causing 30% stenosis.  Left renal artery: Normal  Right renal artery: Normal  Celiac artery: Patent  Superior mesenteric artery: Patent  Right common iliac artery: Diffuse 20% disease.  Right internal iliac artery: Normal  Right external iliac artery: Normal  Right common femoral artery: Minor irregularities.  Right profunda femoral artery: Diffuse 20% disease proximally.  Right superficial femoral artery: Diffuse atherosclerosis throughout its course. There is 20% disease at the ostium. This is followed by 70% stenosis proximally. There is another 70% stenosis in the midsegment followed by an occlusion distally   Reconstituting in the proximal popliteal artery. This segment is about 50 mm and heavily calcified.   Right popliteal artery:  20-30% disease proximally.  Right tibial peroneal trunk:  Normal  Right anterior tibial artery:  Diffusely diseased proximally and seems to be occluded in the midsegment.  Right peroneal artery:  Patent but diffusely diseased.  Right posterior tibial artery:  Patent and the dominant vessel below the knee. It has minor irregularities.  Left common iliac artery:  There is a 70-80 % heavily calcified lesion proximally.  Left internal iliac artery:  Diffusely diseased.  Left external  iliac artery:  Minor irregularities.  Left common femoral artery:  Heavily calcified with diffuse 90% disease extending into the ostium of the SFA.  Left profunda femoral artery:  Normal.  Left  superficial femoral artery:  70% ostial disease with heavy calcifications. Diffuse 40% disease proximally. The vessel is occluded in the midsegment with reconstitution distally via collaterals from the profunda.  Left popliteal artery:  Minor irregularities.  Left tibial peroneal trunk:  Minor irregularities.  Left anterior tibial artery:  Patent. This is the dominant vessel.  Left peroneal artery:  Patent but diffusely diseased.  Left posterior tibial artery:  Occlude proximally.  Conclusions:  1. Significant heavily calcified ostial left common iliac artery stenosis with plaque extending into the distal aorta. 2. Severe heavily calcified disease in the left common femoral artery. 3. Bilateral SFA occlusion with heavy calcifications. 4. Significant below the knee disease bilaterally as outlined above.  Recommendations:  The patient has diffuse and heavily calcified peripheral arterial disease. Revascularization options are somewhat difficult by endovascular means. There is heavily calcified disease in the left common femoral artery which is not approachable by endovascular options. Endarterectomy is the best option for this. The stenosis in the left common iliac artery can be treated with stenting but will require likely stenting the distal aorta as well. Both SFAs are diffusely diseased, calcified with chronic occlusion. We'll attempt medical therapy and a walking program for now.   Lorine Bears, MD, [facility] 07/21/2012 12:58 PM

## 2012-07-21 NOTE — Interval H&P Note (Signed)
History and Physical Interval Note:  07/21/2012 12:16 PM  Leah Ferrell  has presented today for surgery, with the diagnosis of pvd  The various methods of treatment have been discussed with the patient and family. After consideration of risks, benefits and other options for treatment, the patient has consented to  Procedure(s): ABDOMINAL AORTAGRAM (N/A) as a surgical intervention .  The patient's history has been reviewed, patient examined, no change in status, stable for surgery.  I have reviewed the patient's chart and labs.  Questions were answered to the patient's satisfaction.     Lorine Bears

## 2012-07-21 NOTE — Progress Notes (Signed)
UP AND WALKED AND TOL WELL AND LEFT GROIN STABLE; NO BLEEDING OR HEMATOMA 

## 2012-08-06 ENCOUNTER — Telehealth: Payer: Self-pay | Admitting: General Practice

## 2012-08-09 ENCOUNTER — Other Ambulatory Visit: Payer: Self-pay | Admitting: General Practice

## 2012-08-09 MED ORDER — GLUCOSE BLOOD VI STRP: ORAL_STRIP | Status: DC

## 2012-08-09 NOTE — Telephone Encounter (Signed)
Script printed and ready for pick up. thx

## 2012-08-09 NOTE — Telephone Encounter (Signed)
Script ready.

## 2012-08-10 ENCOUNTER — Ambulatory Visit (INDEPENDENT_AMBULATORY_CARE_PROVIDER_SITE_OTHER): Payer: 59 | Admitting: Cardiovascular Disease

## 2012-08-10 ENCOUNTER — Encounter: Payer: Self-pay | Admitting: Cardiovascular Disease

## 2012-08-10 VITALS — BP 144/72 | HR 62 | Ht 62.5 in | Wt 202.8 lb

## 2012-08-10 DIAGNOSIS — I739 Peripheral vascular disease, unspecified: Secondary | ICD-10-CM

## 2012-08-10 NOTE — Progress Notes (Signed)
HPI  This is a pleasant 62 year old female who is here today for a followup visit regarding peripheral arterial disease.  She has known history of coronary artery disease status post drug-eluting stent placement to the LAD. The patient complained of bilateral calf pain with walking and had an ABI done which was abnormal at 0. 49 right; 0.39 left. I performed lower extremity angiography which showed significant calcified left ostial common iliac artery stenosis extending into the distal aorta, severe left common femoral artery disease, bilateral SFA occlusions with diffusely diseased segments proximally and heavy calcifications. There was significant disease below the knee bilaterally with functionally one-vessel runoff. Her symptoms are still unchanged. She complains of right calf claudication more than left. There is no rest pain or ulceration.  No Known Allergies   Current Outpatient Prescriptions on File Prior to Visit  Medication Sig Dispense Refill  . albuterol (PROVENTIL HFA;VENTOLIN HFA) 108 (90 BASE) MCG/ACT inhaler Inhale 1-2 puffs into the lungs every 6 (six) hours as needed for wheezing or shortness of breath.      Marland Kitchen amLODipine (NORVASC) 5 MG tablet Take 1 tablet (5 mg total) by mouth daily.  30 tablet  6  . aspirin EC 81 MG tablet Take 81 mg by mouth 2 (two) times daily.      Marland Kitchen atorvastatin (LIPITOR) 40 MG tablet       . Calcium Carbonate-Vit D-Min (CALTRATE 600+D PLUS MINERALS) 600-800 MG-UNIT TABS Take 1 tablet by mouth 2 (two) times daily.      . colesevelam (WELCHOL) 625 MG tablet Take 1 tablet (625 mg total) by mouth 3 (three) times daily with meals. 3 tabs BID  270 tablet  0  . fish oil-omega-3 fatty acids 1000 MG capsule Take 1 g by mouth every morning.      . furosemide (LASIX) 20 MG tablet Take 1 tablet (20 mg total) by mouth daily.  30 tablet  6  . glucose blood (FREESTYLE TEST STRIPS) test strip Use as instructed  100 each  0  . hydroxypropyl methylcellulose (ISOPTO  TEARS) 2.5 % ophthalmic solution Place 1 drop into both eyes 3 (three) times daily as needed (for dry eyes).      . insulin aspart (NOVOLOG) 100 UNIT/ML injection Inject 5-20 Units into the skin 3 (three) times daily as needed for high blood sugar. For CBG greater than 250 take 5 units; for every 50 above that take an additional 5 units      . Insulin Aspart Prot & Aspart (70-30) 100 UNIT/ML SUPN Inject 98 Units into the skin 3 (three) times daily with meals.      Marland Kitchen lisinopril (PRINIVIL,ZESTRIL) 20 MG tablet Take 1 tablet (20 mg total) by mouth 2 (two) times daily.  60 tablet  6  . lisinopril-hydrochlorothiazide (PRINZIDE,ZESTORETIC) 20-25 MG per tablet Take 1 tablet by mouth daily.  90 tablet  0  . losartan (COZAAR) 100 MG tablet       . meloxicam (MOBIC) 15 MG tablet Take 1 tablet (15 mg total) by mouth daily.  10 tablet  0  . metoprolol (LOPRESSOR) 50 MG tablet       . montelukast (SINGULAIR) 10 MG tablet Take 1 tablet (10 mg total) by mouth at bedtime.  30 tablet  3  . Multiple Vitamin (MULTIVITAMIN WITH MINERALS) TABS Take 1 tablet by mouth daily.      . nitroGLYCERIN (NITROSTAT) 0.4 MG SL tablet Place 1 tablet (0.4 mg total) under the tongue every 5 (five)  minutes as needed for chest pain (up to 3 doses).  25 tablet  4  . pravastatin (PRAVACHOL) 80 MG tablet Take 1 tablet (80 mg total) by mouth at bedtime.  30 tablet  5   No current facility-administered medications on file prior to visit.     Past Medical History  Diagnosis Date  . Hypertension   . Diabetes mellitus without complication   . Hyperlipidemia   . CAD (coronary artery disease)     a. 04/2012 NSTEMI: s/p DES to LAD.  Marland Kitchen PVD (peripheral vascular disease)     a. 04/2012 ABI: R 0.49, L 0.39.  Marland Kitchen Chronic diastolic CHF (congestive heart failure)   . Leukocytosis   . Morbid obesity   . NSTEMI (non-ST elevated myocardial infarction) 04/27/2012  . Dysrhythmia   . Shortness of breath      Past Surgical History  Procedure  Laterality Date  . Tumor removal    . Lad stent       No family history on file.   History   Social History  . Marital Status: Married    Spouse Name: N/A    Number of Children: N/A  . Years of Education: N/A   Occupational History  . Not on file.   Social History Main Topics  . Smoking status: Former Smoker    Quit date: 02/17/2001  . Smokeless tobacco: Former Neurosurgeon    Quit date: 04/20/2001  . Alcohol Use: No  . Drug Use: No  . Sexually Active: Not on file   Other Topics Concern  . Not on file   Social History Narrative  . No narrative on file      PHYSICAL EXAM   BP 144/72  Pulse 62  Ht 5' 2.5" (1.588 m)  Wt 202 lb 12.8 oz (91.989 kg)  BMI 36.48 kg/m2 Constitutional: She is oriented to person, place, and time. She appears well-developed and well-nourished. No distress.  HENT: No nasal discharge.  Head: Normocephalic and atraumatic.  Eyes: Pupils are equal and round. Right eye exhibits no discharge. Left eye exhibits no discharge.  Neck: Normal range of motion. Neck supple. No JVD present. No thyromegaly present.  Cardiovascular: Normal rate, regular rhythm, normal heart sounds. Exam reveals no gallop and no friction rub. No murmur heard.  Pulmonary/Chest: Effort normal and breath sounds normal. No stridor. No respiratory distress. She has no wheezes. She has no rales. She exhibits no tenderness.  Abdominal: Soft. Bowel sounds are normal. She exhibits no distension. There is no tenderness. There is no rebound and no guarding.  Musculoskeletal: Normal range of motion. She exhibits no edema and no tenderness.  Neurological: She is alert and oriented to person, place, and time. Coordination normal.  Skin: Skin is warm and dry. No rash noted. She is not diaphoretic. No erythema. No pallor.  Psychiatric: She has a normal mood and affect. Her behavior is normal. Judgment and thought content normal.  Vascular: Femoral pulses slightly diminished bilaterally. Distal  pulses are not palpable. Left groin is intact with no hematoma    ASSESSMENT AND PLAN

## 2012-08-10 NOTE — Patient Instructions (Addendum)
Your physician wants you to follow-up in: 6 MONTHS with Dr Arida.  You will receive a reminder letter in the mail two months in advance. If you don't receive a letter, please call our office to schedule the follow-up appointment.  Your physician recommends that you continue on your current medications as directed. Please refer to the Current Medication list given to you today.  

## 2012-08-12 ENCOUNTER — Ambulatory Visit: Payer: 59 | Admitting: Cardiology

## 2012-08-13 ENCOUNTER — Encounter: Payer: Self-pay | Admitting: Cardiovascular Disease

## 2012-08-13 NOTE — Assessment & Plan Note (Signed)
The patient has significant underlying peripheral arterial disease. However, she has diffuse disease in very calcified vessels which makes endovascular options limited. She seems to be more symptomatic on the right side than the left side. Right SFA is occluded in the midsegment with multiple areas of disease proximally. Although this is approachable by endovascular means, it will require feeding very long segment which will significantly compromise long-term patency. The left iliac disease can be treated if needed. The left common femoral artery disease is best treated surgically with endarterectomy. However, her symptoms are worse on the right side on the left side. For now, I think the best option is to treat him medically and encourage her to start an exercise program to improve her claudication distance. I will reserve endovascular intervention for critical limb ischemia or worsening claudication.  After she finishes the course of dual antiplatelet therapy for LAD stent, I will plan on initiating treatment with Pletal.

## 2012-08-19 ENCOUNTER — Telehealth: Payer: Self-pay | Admitting: *Deleted

## 2012-08-19 ENCOUNTER — Encounter: Payer: Self-pay | Admitting: *Deleted

## 2012-08-19 DIAGNOSIS — I2510 Atherosclerotic heart disease of native coronary artery without angina pectoris: Secondary | ICD-10-CM

## 2012-08-19 DIAGNOSIS — Z79899 Other long term (current) drug therapy: Secondary | ICD-10-CM

## 2012-08-19 DIAGNOSIS — E785 Hyperlipidemia, unspecified: Secondary | ICD-10-CM

## 2012-08-19 NOTE — Telephone Encounter (Signed)
Message copied by Lesle Chris on Thu Aug 19, 2012  2:06 PM ------      Message from: Lesle Chris      Created: Wed May 12, 2012  3:02 PM       FLP, LFT  ------

## 2012-08-19 NOTE — Telephone Encounter (Signed)
Letter & orders mailed today.  

## 2012-08-26 ENCOUNTER — Ambulatory Visit: Payer: 59 | Admitting: General Practice

## 2012-08-27 ENCOUNTER — Ambulatory Visit (INDEPENDENT_AMBULATORY_CARE_PROVIDER_SITE_OTHER): Payer: 59 | Admitting: Physician Assistant

## 2012-08-27 ENCOUNTER — Encounter: Payer: Self-pay | Admitting: Physician Assistant

## 2012-08-27 VITALS — BP 160/65 | HR 64 | Ht 62.5 in | Wt 201.8 lb

## 2012-08-27 DIAGNOSIS — I739 Peripheral vascular disease, unspecified: Secondary | ICD-10-CM

## 2012-08-27 DIAGNOSIS — I10 Essential (primary) hypertension: Secondary | ICD-10-CM

## 2012-08-27 DIAGNOSIS — I5032 Chronic diastolic (congestive) heart failure: Secondary | ICD-10-CM

## 2012-08-27 DIAGNOSIS — R0989 Other specified symptoms and signs involving the circulatory and respiratory systems: Secondary | ICD-10-CM

## 2012-08-27 DIAGNOSIS — E785 Hyperlipidemia, unspecified: Secondary | ICD-10-CM

## 2012-08-27 DIAGNOSIS — I2510 Atherosclerotic heart disease of native coronary artery without angina pectoris: Secondary | ICD-10-CM

## 2012-08-27 MED ORDER — AMLODIPINE BESYLATE 10 MG PO TABS: 10.0000 mg | ORAL_TABLET | Freq: Every day | ORAL | Status: DC

## 2012-08-27 NOTE — Progress Notes (Signed)
Primary Cardiologist: Simona Huh, MD   HPI: Scheduled 3 month followup.  She returns today continuing to do well, reporting no interim development of exertional CP.   She was also seen by Dr. Kirke Corin in the interim, who performed lower extremity angiography. This yielded extensive PAD, notable for significant calcified left ostial CIA stenosis, severe left CFA disease, and bilateral SFA occlusions, with bilateral functional one-vessel runoff. His initial recommendation is to treat medically, employing a regular exercise program, with plans to reserve endovascular intervention for critical limb ischemia or worsening claudication. He also indicated plans to initiate therapy with Pletal, after she completes her DAPT regimen.  No Known Allergies  Current Outpatient Prescriptions  Medication Sig Dispense Refill  . albuterol (PROVENTIL HFA;VENTOLIN HFA) 108 (90 BASE) MCG/ACT inhaler Inhale 1-2 puffs into the lungs every 6 (six) hours as needed for wheezing or shortness of breath.      Marland Kitchen amLODipine (NORVASC) 10 MG tablet Take 1 tablet (10 mg total) by mouth daily.  30 tablet  6  . aspirin EC 81 MG tablet Take 81 mg by mouth 2 (two) times daily.      Marland Kitchen atorvastatin (LIPITOR) 40 MG tablet       . Calcium Carbonate-Vit D-Min (CALTRATE 600+D PLUS MINERALS) 600-800 MG-UNIT TABS Take 1 tablet by mouth 2 (two) times daily.      . colesevelam (WELCHOL) 625 MG tablet Take 1 tablet (625 mg total) by mouth 3 (three) times daily with meals. 3 tabs BID  270 tablet  0  . fish oil-omega-3 fatty acids 1000 MG capsule Take 1 g by mouth every morning.      . furosemide (LASIX) 20 MG tablet Take 1 tablet (20 mg total) by mouth daily.  30 tablet  6  . glucose blood (FREESTYLE TEST STRIPS) test strip Use as instructed  100 each  0  . hydroxypropyl methylcellulose (ISOPTO TEARS) 2.5 % ophthalmic solution Place 1 drop into both eyes 3 (three) times daily as needed (for dry eyes).      . insulin aspart (NOVOLOG) 100 UNIT/ML  injection Inject 5-20 Units into the skin 3 (three) times daily as needed for high blood sugar. For CBG greater than 250 take 5 units; for every 50 above that take an additional 5 units      . Insulin Aspart Prot & Aspart (70-30) 100 UNIT/ML SUPN Inject 98 Units into the skin 3 (three) times daily with meals.      Marland Kitchen lisinopril (PRINIVIL,ZESTRIL) 20 MG tablet Take 1 tablet (20 mg total) by mouth 2 (two) times daily.  60 tablet  6  . lisinopril-hydrochlorothiazide (PRINZIDE,ZESTORETIC) 20-25 MG per tablet Take 1 tablet by mouth daily.  90 tablet  0  . losartan (COZAAR) 100 MG tablet       . meloxicam (MOBIC) 15 MG tablet Take 1 tablet (15 mg total) by mouth daily.  10 tablet  0  . metoprolol (LOPRESSOR) 50 MG tablet Take 50 mg by mouth 2 (two) times daily.       . montelukast (SINGULAIR) 10 MG tablet Take 1 tablet (10 mg total) by mouth at bedtime.  30 tablet  3  . Multiple Vitamin (MULTIVITAMIN WITH MINERALS) TABS Take 1 tablet by mouth daily.      . nitroGLYCERIN (NITROSTAT) 0.4 MG SL tablet Place 1 tablet (0.4 mg total) under the tongue every 5 (five) minutes as needed for chest pain (up to 3 doses).  25 tablet  4  . pravastatin (PRAVACHOL) 80  MG tablet Take 1 tablet (80 mg total) by mouth at bedtime.  30 tablet  5  . Ticagrelor (BRILINTA) 90 MG TABS tablet Take 90 mg by mouth 2 (two) times daily.       No current facility-administered medications for this visit.    Past Medical History  Diagnosis Date  . Hypertension   . Diabetes mellitus without complication   . Hyperlipidemia   . CAD (coronary artery disease)     a. 04/2012 NSTEMI: s/p DES to LAD.  Marland Kitchen PVD (peripheral vascular disease)     a. 04/2012 ABI: R 0.49, L 0.39. Angiography 06/14: Significant ostial left common iliac artery stenosis extending into the distal aorta, severe left common femoral artery stenosis, bilateral SFA occlusion with heavy calcifications. Functionally, one-vessel runoff bilaterally below the knee  . Chronic  diastolic CHF (congestive heart failure)   . Leukocytosis   . Morbid obesity   . NSTEMI (non-ST elevated myocardial infarction) 04/27/2012  . Dysrhythmia   . Shortness of breath     Past Surgical History  Procedure Laterality Date  . Tumor removal    . Lad stent      History   Social History  . Marital Status: Married    Spouse Name: N/A    Number of Children: N/A  . Years of Education: N/A   Occupational History  . Not on file.   Social History Main Topics  . Smoking status: Former Smoker    Quit date: 02/17/2001  . Smokeless tobacco: Former Neurosurgeon    Quit date: 04/20/2001  . Alcohol Use: No  . Drug Use: No  . Sexually Active: Not on file   Other Topics Concern  . Not on file   Social History Narrative  . No narrative on file    No family history on file.  ROS: no nausea, vomiting; no fever, chills; no melena, hematochezia; no claudication  PHYSICAL EXAM: BP 160/65  Pulse 64  Ht 5' 2.5" (1.588 m)  Wt 201 lb 12.8 oz (91.536 kg)  BMI 36.3 kg/m2 GENERAL: 62 year-old female, morbidly obese; NAD  HEENT: NCAT, PERRLA, EOMI; sclera clear; no xanthelasma  NECK: Soft, bilateral carotid bruits; no JVD; no TM  LUNGS: CTA bilaterally  CARDIAC: RRR (S1, S2); no significant murmurs; no rubs or gallops  ABDOMEN: soft, protuberant  EXTREMETIES: no significant peripheral edema  SKIN: warm/dry; no obvious rash/lesions  MUSCULOSKELETAL: no joint deformity  NEURO: no focal deficit; NL affec    EKG:   ASSESSMENT & PLAN:  CAD (coronary artery disease) Stable on current medication regimen. Continue DAPT for at least 1 year.  Chronic diastolic heart failure Stable on low-dose diuretic regimen. Patient has lost 4 pounds since last OV.  Hypertension Uncontrolled. Will increase amlodipine to 10 mg daily.  Hyperlipidemia Continue current dose Lipitor, pending review of upcoming schedule lipid profile. Most recent LDL 82. Target LDL 70 or less, if feasible.  PVD  (peripheral vascular disease) Followup with Dr. Kirke Corin, as scheduled  Carotid bruit Nonobstructive bilateral ICA disease, by carotid Dopplers, 04/2012.    Gene Ezelle Surprenant, PAC

## 2012-08-27 NOTE — Assessment & Plan Note (Signed)
Followup with Dr. Kirke Corin, as scheduled

## 2012-08-27 NOTE — Assessment & Plan Note (Signed)
Uncontrolled. Will increase amlodipine to 10 mg daily.

## 2012-08-27 NOTE — Patient Instructions (Signed)
   Increase Norvasc to 10mg  daily - new sent to pharm Continue all other current medications. Follow up in  3 months

## 2012-08-27 NOTE — Assessment & Plan Note (Signed)
Nonobstructive bilateral ICA disease, by carotid Dopplers, 04/2012.

## 2012-08-27 NOTE — Assessment & Plan Note (Signed)
Continue current dose Lipitor, pending review of upcoming schedule lipid profile. Most recent LDL 82. Target LDL 70 or less, if feasible.

## 2012-08-27 NOTE — Assessment & Plan Note (Signed)
Stable on low-dose diuretic regimen. Patient has lost 4 pounds since last OV.

## 2012-08-27 NOTE — Assessment & Plan Note (Signed)
Stable on current medication regimen. Continue DAPT for at least 1 year.

## 2012-09-03 ENCOUNTER — Ambulatory Visit: Payer: 59 | Admitting: General Practice

## 2012-09-16 ENCOUNTER — Encounter: Payer: Self-pay | Admitting: General Practice

## 2012-09-16 ENCOUNTER — Ambulatory Visit (INDEPENDENT_AMBULATORY_CARE_PROVIDER_SITE_OTHER): Payer: 59 | Admitting: General Practice

## 2012-09-16 VITALS — BP 122/58 | HR 51 | Temp 97.9°F | Ht 62.5 in | Wt 196.5 lb

## 2012-09-16 DIAGNOSIS — M79641 Pain in right hand: Secondary | ICD-10-CM

## 2012-09-16 DIAGNOSIS — M25539 Pain in unspecified wrist: Secondary | ICD-10-CM

## 2012-09-16 DIAGNOSIS — M79609 Pain in unspecified limb: Secondary | ICD-10-CM

## 2012-09-16 DIAGNOSIS — E119 Type 2 diabetes mellitus without complications: Secondary | ICD-10-CM

## 2012-09-16 DIAGNOSIS — E785 Hyperlipidemia, unspecified: Secondary | ICD-10-CM

## 2012-09-16 DIAGNOSIS — Z862 Personal history of diseases of the blood and blood-forming organs and certain disorders involving the immune mechanism: Secondary | ICD-10-CM

## 2012-09-16 DIAGNOSIS — I10 Essential (primary) hypertension: Secondary | ICD-10-CM

## 2012-09-16 DIAGNOSIS — M25531 Pain in right wrist: Secondary | ICD-10-CM

## 2012-09-16 DIAGNOSIS — Z8639 Personal history of other endocrine, nutritional and metabolic disease: Secondary | ICD-10-CM

## 2012-09-16 LAB — POCT CBC
Granulocyte percent: 68.9 %G (ref 37–80)
HCT, POC: 42.2 % (ref 37.7–47.9)
Lymph, poc: 3.7 — AB (ref 0.6–3.4)
MCHC: 32.7 g/dL (ref 31.8–35.4)
POC Granulocyte: 9.2 — AB (ref 2–6.9)
POC LYMPH PERCENT: 27.3 %L (ref 10–50)
Platelet Count, POC: 330 10*3/uL (ref 142–424)
RDW, POC: 14.4 %

## 2012-09-16 LAB — POCT GLYCOSYLATED HEMOGLOBIN (HGB A1C): Hemoglobin A1C: 7.2

## 2012-09-16 MED ORDER — INSULIN ASPART PROT & ASPART (70-30 MIX) 100 UNIT/ML PEN: 98.0000 [IU] | PEN_INJECTOR | Freq: Three times a day (TID) | SUBCUTANEOUS | Status: DC

## 2012-09-16 MED ORDER — INSULIN ASPART 100 UNIT/ML ~~LOC~~ SOLN: 5.0000 [IU] | Freq: Three times a day (TID) | SUBCUTANEOUS | Status: DC | PRN

## 2012-09-16 NOTE — Patient Instructions (Signed)
Hypertriglyceridemia  Diet for High blood levels of Triglycerides Most fats in food are triglycerides. Triglycerides in your blood are stored as fat in your body. High levels of triglycerides in your blood may put you at a greater risk for heart disease and stroke.  Normal triglyceride levels are less than 150 mg/dL. Borderline high levels are 150-199 mg/dl. High levels are 200 - 499 mg/dL, and very high triglyceride levels are greater than 500 mg/dL. The decision to treat high triglycerides is generally based on the level. For people with borderline or high triglyceride levels, treatment includes weight loss and exercise. Drugs are recommended for people with very high triglyceride levels. Many people who need treatment for high triglyceride levels have metabolic syndrome. This syndrome is a collection of disorders that often include: insulin resistance, high blood pressure, blood clotting problems, high cholesterol and triglycerides. TESTING PROCEDURE FOR TRIGLYCERIDES  You should not eat 4 hours before getting your triglycerides measured. The normal range of triglycerides is between 10 and 250 milligrams per deciliter (mg/dl). Some people may have extreme levels (1000 or above), but your triglyceride level may be too high if it is above 150 mg/dl, depending on what other risk factors you have for heart disease.  People with high blood triglycerides may also have high blood cholesterol levels. If you have high blood cholesterol as well as high blood triglycerides, your risk for heart disease is probably greater than if you only had high triglycerides. High blood cholesterol is one of the main risk factors for heart disease. CHANGING YOUR DIET  Your weight can affect your blood triglyceride level. If you are more than 20% above your ideal body weight, you may be able to lower your blood triglycerides by losing weight. Eating less and exercising regularly is the best way to combat this. Fat provides more  calories than any other food. The best way to lose weight is to eat less fat. Only 30% of your total calories should come from fat. Less than 7% of your diet should come from saturated fat. A diet low in fat and saturated fat is the same as a diet to decrease blood cholesterol. By eating a diet lower in fat, you may lose weight, lower your blood cholesterol, and lower your blood triglyceride level.  Eating a diet low in fat, especially saturated fat, may also help you lower your blood triglyceride level. Ask your dietitian to help you figure how much fat you can eat based on the number of calories your caregiver has prescribed for you.  Exercise, in addition to helping with weight loss may also help lower triglyceride levels.   Alcohol can increase blood triglycerides. You may need to stop drinking alcoholic beverages.  Too much carbohydrate in your diet may also increase your blood triglycerides. Some complex carbohydrates are necessary in your diet. These may include bread, rice, potatoes, other starchy vegetables and cereals.  Reduce "simple" carbohydrates. These may include pure sugars, candy, honey, and jelly without losing other nutrients. If you have the kind of high blood triglycerides that is affected by the amount of carbohydrates in your diet, you will need to eat less sugar and less high-sugar foods. Your caregiver can help you with this.  Adding 2-4 grams of fish oil (EPA+ DHA) may also help lower triglycerides. Speak with your caregiver before adding any supplements to your regimen. Following the Diet  Maintain your ideal weight. Your caregivers can help you with a diet. Generally, eating less food and getting more   exercise will help you lose weight. Joining a weight control group may also help. Ask your caregivers for a good weight control group in your area.  Eat low-fat foods instead of high-fat foods. This can help you lose weight too.  These foods are lower in fat. Eat MORE of these:    Dried beans, peas, and lentils.  Egg whites.  Low-fat cottage cheese.  Fish.  Lean cuts of meat, such as round, sirloin, rump, and flank (cut extra fat off meat you fix).  Whole grain breads, cereals and pasta.  Skim and nonfat dry milk.  Low-fat yogurt.  Poultry without the skin.  Cheese made with skim or part-skim milk, such as mozzarella, parmesan, farmers', ricotta, or pot cheese. These are higher fat foods. Eat LESS of these:   Whole milk and foods made from whole milk, such as American, blue, cheddar, monterey jack, and swiss cheese  High-fat meats, such as luncheon meats, sausages, knockwurst, bratwurst, hot dogs, ribs, corned beef, ground pork, and regular ground beef.  Fried foods. Limit saturated fats in your diet. Substituting unsaturated fat for saturated fat may decrease your blood triglyceride level. You will need to read package labels to know which products contain saturated fats.  These foods are high in saturated fat. Eat LESS of these:   Fried pork skins.  Whole milk.  Skin and fat from poultry.  Palm oil.  Butter.  Shortening.  Cream cheese.  Bacon.  Margarines and baked goods made from listed oils.  Vegetable shortenings.  Chitterlings.  Fat from meats.  Coconut oil.  Palm kernel oil.  Lard.  Cream.  Sour cream.  Fatback.  Coffee whiteners and non-dairy creamers made with these oils.  Cheese made from whole milk. Use unsaturated fats (both polyunsaturated and monounsaturated) moderately. Remember, even though unsaturated fats are better than saturated fats; you still want a diet low in total fat.  These foods are high in unsaturated fat:   Canola oil.  Sunflower oil.  Mayonnaise.  Almonds.  Peanuts.  Pine nuts.  Margarines made with these oils.  Safflower oil.  Olive oil.  Avocados.  Cashews.  Peanut butter.  Sunflower seeds.  Soybean oil.  Peanut  oil.  Olives.  Pecans.  Walnuts.  Pumpkin seeds. Avoid sugar and other high-sugar foods. This will decrease carbohydrates without decreasing other nutrients. Sugar in your food goes rapidly to your blood. When there is excess sugar in your blood, your liver may use it to make more triglycerides. Sugar also contains calories without other important nutrients.  Eat LESS of these:   Sugar, brown sugar, powdered sugar, jam, jelly, preserves, honey, syrup, molasses, pies, candy, cakes, cookies, frosting, pastries, colas, soft drinks, punches, fruit drinks, and regular gelatin.  Avoid alcohol. Alcohol, even more than sugar, may increase blood triglycerides. In addition, alcohol is high in calories and low in nutrients. Ask for sparkling water, or a diet soft drink instead of an alcoholic beverage. Suggestions for planning and preparing meals   Bake, broil, grill or roast meats instead of frying.  Remove fat from meats and skin from poultry before cooking.  Add spices, herbs, lemon juice or vinegar to vegetables instead of salt, rich sauces or gravies.  Use a non-stick skillet without fat or use no-stick sprays.  Cool and refrigerate stews and broth. Then remove the hardened fat floating on the surface before serving.  Refrigerate meat drippings and skim off fat to make low-fat gravies.  Serve more fish.  Use less butter,   margarine and other high-fat spreads on bread or vegetables.  Use skim or reconstituted non-fat dry milk for cooking.  Cook with low-fat cheeses.  Substitute low-fat yogurt or cottage cheese for all or part of the sour cream in recipes for sauces, dips or congealed salads.  Use half yogurt/half mayonnaise in salad recipes.  Substitute evaporated skim milk for cream. Evaporated skim milk or reconstituted non-fat dry milk can be whipped and substituted for whipped cream in certain recipes.  Choose fresh fruits for dessert instead of high-fat foods such as pies or  cakes. Fruits are naturally low in fat. When Dining Out   Order low-fat appetizers such as fruit or vegetable juice, pasta with vegetables or tomato sauce.  Select clear, rather than cream soups.  Ask that dressings and gravies be served on the side. Then use less of them.  Order foods that are baked, broiled, poached, steamed, stir-fried, or roasted.  Ask for margarine instead of butter, and use only a small amount.  Drink sparkling water, unsweetened tea or coffee, or diet soft drinks instead of alcohol or other sweet beverages. QUESTIONS AND ANSWERS ABOUT OTHER FATS IN THE BLOOD: SATURATED FAT, TRANS FAT, AND CHOLESTEROL What is trans fat? Trans fat is a type of fat that is formed when vegetable oil is hardened through a process called hydrogenation. This process helps makes foods more solid, gives them shape, and prolongs their shelf life. Trans fats are also called hydrogenated or partially hydrogenated oils.  What do saturated fat, trans fat, and cholesterol in foods have to do with heart disease? Saturated fat, trans fat, and cholesterol in the diet all raise the level of LDL "bad" cholesterol in the blood. The higher the LDL cholesterol, the greater the risk for coronary heart disease (CHD). Saturated fat and trans fat raise LDL similarly.  What foods contain saturated fat, trans fat, and cholesterol? High amounts of saturated fat are found in animal products, such as fatty cuts of meat, chicken skin, and full-fat dairy products like butter, whole milk, cream, and cheese, and in tropical vegetable oils such as palm, palm kernel, and coconut oil. Trans fat is found in some of the same foods as saturated fat, such as vegetable shortening, some margarines (especially hard or stick margarine), crackers, cookies, baked goods, fried foods, salad dressings, and other processed foods made with partially hydrogenated vegetable oils. Small amounts of trans fat also occur naturally in some animal  products, such as milk products, beef, and lamb. Foods high in cholesterol include liver, other organ meats, egg yolks, shrimp, and full-fat dairy products. How can I use the new food label to make heart-healthy food choices? Check the Nutrition Facts panel of the food label. Choose foods lower in saturated fat, trans fat, and cholesterol. For saturated fat and cholesterol, you can also use the Percent Daily Value (%DV): 5% DV or less is low, and 20% DV or more is high. (There is no %DV for trans fat.) Use the Nutrition Facts panel to choose foods low in saturated fat and cholesterol, and if the trans fat is not listed, read the ingredients and limit products that list shortening or hydrogenated or partially hydrogenated vegetable oil, which tend to be high in trans fat. POINTS TO REMEMBER:   Discuss your risk for heart disease with your caregivers, and take steps to reduce risk factors.  Change your diet. Choose foods that are low in saturated fat, trans fat, and cholesterol.  Add exercise to your daily routine if   it is not already being done. Participate in physical activity of moderate intensity, like brisk walking, for at least 30 minutes on most, and preferably all days of the week. No time? Break the 30 minutes into three, 10-minute segments during the day.  Stop smoking. If you do smoke, contact your caregiver to discuss ways in which they can help you quit.  Do not use street drugs.  Maintain a normal weight.  Maintain a healthy blood pressure.  Keep up with your blood work for checking the fats in your blood as directed by your caregiver. Document Released: 11/22/2003 Document Revised: 08/05/2011 Document Reviewed: 06/19/2008 [employer] Patient Information 2014 [employer].  

## 2012-09-16 NOTE — Progress Notes (Signed)
  Subjective:    Patient ID: Leah Ferrell, female    DOB: April 01, 1950, 62 y.o.   MRN: [id number]  HPI Patient presents today for follow up of chronic conditions. She has history of hypertension, hyperlipidemia, CHF,  diabetes, and CAD. She reports taking medications as directed and denies muscle aches. She reports taking blood sugar at home twice daily and ranges 80's-240. She reports still having pain and discomfort in left wrist and hand. An xray was taken during last visit and medication taken, without relief. She reports wanting to see a specialist, because it is beginning to worsen and concerned about ability to perform job.      Review of Systems  Constitutional: Negative for fever and chills.  HENT: Negative for neck pain and neck stiffness.   Respiratory: Negative for chest tightness and shortness of breath.   Cardiovascular: Negative for chest pain and palpitations.  Gastrointestinal: Negative for nausea, vomiting, abdominal pain and blood in stool.  Genitourinary: Negative for hematuria and difficulty urinating.  Neurological: Negative for dizziness, weakness and headaches.       Objective:   Physical Exam  Constitutional: She is oriented to person, place, and time. She appears well-developed and well-nourished.  HENT:  Head: Normocephalic and atraumatic.  Right Ear: External ear normal.  Left Ear: External ear normal.  Nose: Nose normal.  Mouth/Throat: Oropharynx is clear and moist.  Eyes: Conjunctivae and EOM are normal. Pupils are equal, round, and reactive to light.  Neck: Normal range of motion. Neck supple.  Cardiovascular: Normal rate, regular rhythm and normal heart sounds.   Pulmonary/Chest: Effort normal and breath sounds normal.  Abdominal: Soft. Bowel sounds are normal. She exhibits no distension. There is no tenderness.  Musculoskeletal: She exhibits tenderness.  Tenderness noted with flexion at wrist of right hand and slight edema. Positive +3 radial pulse,  with less than 3 seconds capillary refill. Able to make closed fist.   Neurological: She is alert and oriented to person, place, and time.  Skin: Skin is warm and dry.  Psychiatric: She has a normal mood and affect.          Assessment & Plan:  1. Essential hypertension, benign - POCT CBC - CMP14+EGFR  2. Other and unspecified hyperlipidemia - NMR, lipoprofile  3. Diabetes - POCT glycosylated hemoglobin (Hb A1C) - Insulin Aspart Prot & Aspart (70-30) 100 UNIT/ML SUPN; Inject 98 Units into the skin 3 (three) times daily with meals.  Dispense: 85 pen; Refill: 0 - insulin aspart (NOVOLOG) 100 UNIT/ML injection; Inject 5-20 Units into the skin 3 (three) times daily as needed for high blood sugar. For CBG greater than 250 take 5 units; for every 50 above that take an additional 5 units  Dispense: 15 pen; Refill: 0  4. History of thyroid nodule - TSH  5. Right wrist pain and 6. Right hand pain - Ambulatory referral to Orthopedic Surgery -Continue all current medications Labs pending F/u in 3 months Discussed exercise and diet  RTO if symptoms worsen or seek emergency medical treatment Patient verbalized understanding Coralie Keens, FNP-C

## 2012-09-17 LAB — CMP14+EGFR
AST: 23 IU/L (ref 0–40)
Albumin: 4.2 g/dL (ref 3.6–4.8)
Alkaline Phosphatase: 73 IU/L (ref 39–117)
BUN/Creatinine Ratio: 11 (ref 11–26)
BUN: 8 mg/dL (ref 8–27)
CO2: 28 mmol/L (ref 18–29)
Calcium: 10.1 mg/dL (ref 8.6–10.2)
Chloride: 97 mmol/L (ref 97–108)
Creatinine, Ser: 0.72 mg/dL (ref 0.57–1.00)
Sodium: 139 mmol/L (ref 134–144)

## 2012-09-17 LAB — NMR, LIPOPROFILE
LDL Particle Number: 1493 nmol/L — ABNORMAL HIGH (ref <1000)
LDL Size: 20.6 nm (ref >20.5)
LP-IR Score: 42 (ref <=45)
Small LDL Particle Number: 869 nmol/L — ABNORMAL HIGH (ref <=527)
Triglycerides by NMR: 135 mg/dL (ref <150)

## 2012-09-17 LAB — TSH: TSH: 1.63 u[IU]/mL (ref 0.450–4.500)

## 2012-10-13 ENCOUNTER — Other Ambulatory Visit: Payer: Self-pay | Admitting: General Practice

## 2012-10-13 DIAGNOSIS — E119 Type 2 diabetes mellitus without complications: Secondary | ICD-10-CM

## 2012-10-13 MED ORDER — INSULIN ASPART 100 UNIT/ML ~~LOC~~ SOLN: 5.0000 [IU] | Freq: Three times a day (TID) | SUBCUTANEOUS | Status: DC | PRN

## 2012-11-10 ENCOUNTER — Telehealth: Payer: Self-pay | Admitting: General Practice

## 2012-11-18 ENCOUNTER — Encounter: Payer: Self-pay | Admitting: *Deleted

## 2012-11-30 ENCOUNTER — Other Ambulatory Visit: Payer: Self-pay

## 2012-11-30 MED ORDER — METOPROLOL TARTRATE 50 MG PO TABS: 50.0000 mg | ORAL_TABLET | Freq: Two times a day (BID) | ORAL | Status: DC

## 2012-12-16 ENCOUNTER — Encounter: Payer: Self-pay | Admitting: General Practice

## 2012-12-16 ENCOUNTER — Ambulatory Visit (INDEPENDENT_AMBULATORY_CARE_PROVIDER_SITE_OTHER): Payer: 59 | Admitting: General Practice

## 2012-12-16 VITALS — BP 117/60 | HR 58 | Temp 98.1°F | Ht 62.5 in | Wt 196.0 lb

## 2012-12-16 DIAGNOSIS — M2550 Pain in unspecified joint: Secondary | ICD-10-CM

## 2012-12-16 DIAGNOSIS — E119 Type 2 diabetes mellitus without complications: Secondary | ICD-10-CM

## 2012-12-16 DIAGNOSIS — J309 Allergic rhinitis, unspecified: Secondary | ICD-10-CM

## 2012-12-16 DIAGNOSIS — E785 Hyperlipidemia, unspecified: Secondary | ICD-10-CM

## 2012-12-16 DIAGNOSIS — I10 Essential (primary) hypertension: Secondary | ICD-10-CM

## 2012-12-16 LAB — POCT GLYCOSYLATED HEMOGLOBIN (HGB A1C): Hemoglobin A1C: 7.2

## 2012-12-16 MED ORDER — FUROSEMIDE 20 MG PO TABS: 20.0000 mg | ORAL_TABLET | Freq: Every day | ORAL | Status: DC

## 2012-12-16 MED ORDER — PRAVASTATIN SODIUM 80 MG PO TABS: 80.0000 mg | ORAL_TABLET | Freq: Every day | ORAL | Status: DC

## 2012-12-16 MED ORDER — METOPROLOL TARTRATE 50 MG PO TABS: 50.0000 mg | ORAL_TABLET | Freq: Two times a day (BID) | ORAL | Status: DC

## 2012-12-16 MED ORDER — INSULIN ASPART PROT & ASPART (70-30 MIX) 100 UNIT/ML PEN: 98.0000 [IU] | PEN_INJECTOR | Freq: Three times a day (TID) | SUBCUTANEOUS | Status: DC

## 2012-12-16 MED ORDER — COLESEVELAM HCL 625 MG PO TABS: 1875.0000 mg | ORAL_TABLET | Freq: Two times a day (BID) | ORAL | Status: DC

## 2012-12-16 MED ORDER — MONTELUKAST SODIUM 10 MG PO TABS: 10.0000 mg | ORAL_TABLET | Freq: Every day | ORAL | Status: DC

## 2012-12-16 NOTE — Progress Notes (Signed)
  Subjective:    Patient ID: Leah Ferrell, female    DOB: [birth year], 62 y.o.   MRN: [id number]  HPI Patient presents today for chronic health follow up. She has a history of diabetes (type I), hypertension, and hyperlipidemia. She reports some adjustment of medications and profile updated. Reports eating healthy and difficult to exercise due to joint pain. Reports generalized joint pain and stopped taking meloxicam, she felt it wasn't working. Reports checking blood sugars three times daily and range 90-244. Denies checking blood pressure at home.     Review of Systems  Constitutional: Negative for fever and chills.  Respiratory: Negative for chest tightness and shortness of breath.   Cardiovascular: Negative for chest pain and palpitations.  Gastrointestinal: Negative for nausea, vomiting, abdominal pain and blood in stool.  Genitourinary: Negative for hematuria and difficulty urinating.  Musculoskeletal: Negative for neck pain and neck stiffness.  Neurological: Negative for dizziness, weakness and headaches.       Objective:   Physical Exam  Constitutional: She is oriented to person, place, and time. She appears well-developed and well-nourished.  HENT:  Head: Normocephalic and atraumatic.  Right Ear: External ear normal.  Left Ear: External ear normal.  Nose: Nose normal.  Mouth/Throat: Oropharynx is clear and moist.  Eyes: Conjunctivae and EOM are normal. Pupils are equal, round, and reactive to light.  Neck: Normal range of motion. Neck supple.  Cardiovascular: Normal rate, regular rhythm and normal heart sounds.   Pulmonary/Chest: Effort normal and breath sounds normal.  Abdominal: Soft. Bowel sounds are normal. She exhibits no distension. There is no tenderness.  Musculoskeletal: She exhibits tenderness.  Tenderness noted with flexion at wrist of right hand and bilateral ankle. Negative edema    Neurological: She is alert and oriented to person, place, and time.  Skin:  Skin is warm and dry.  Psychiatric: She has a normal mood and affect.          Assessment & Plan:  1. Diabetes  - POCT glycosylated hemoglobin (Hb A1C) - Insulin Aspart Prot & Aspart (70-30) 100 UNIT/ML SUPN; Inject 98 Units into the skin 3 (three) times daily with meals.  Dispense: 85 pen; Refill: 1 - colesevelam (WELCHOL) 625 MG tablet; Take 3 tablets (1,875 mg total) by mouth 2 (two) times daily with a meal.  Dispense: 180 tablet; Refill: 3  2. Hyperlipidemia  - Lipid panel - colesevelam (WELCHOL) 625 MG tablet; Take 3 tablets (1,875 mg total) by mouth 2 (two) times daily with a meal.  Dispense: 180 tablet; Refill: 3 - pravastatin (PRAVACHOL) 80 MG tablet; Take 1 tablet (80 mg total) by mouth at bedtime.  Dispense: 30 tablet; Refill: 3  3. Hypertension  - CMP14+EGFR - furosemide (LASIX) 20 MG tablet; Take 1 tablet (20 mg total) by mouth daily.  Dispense: 30 tablet; Refill: 3 - metoprolol (LOPRESSOR) 50 MG tablet; Take 1 tablet (50 mg total) by mouth 2 (two) times daily.  Dispense: 60 tablet; Refill: 3  4. Joint pain  - Uric acid - Arthritis Panel  5. Allergic rhinitis  - montelukast (SINGULAIR) 10 MG tablet; Take 1 tablet (10 mg total) by mouth at bedtime.  Dispense: 30 tablet; Refill: 3 -Continue all current medications Labs pending F/u in 3 months Discussed exercise and diet  Discussed scheduling appointment with office pharmacist to discuss diabetes and management Will refer to rheumatologist or ortho for joint pain pending labs Patient verbalized understanding Coralie Keens, FNP-C

## 2012-12-16 NOTE — Patient Instructions (Signed)
Hypertriglyceridemia  Diet for High blood levels of Triglycerides Most fats in food are triglycerides. Triglycerides in your blood are stored as fat in your body. High levels of triglycerides in your blood may put you at a greater risk for heart disease and stroke.  Normal triglyceride levels are less than 150 mg/dL. Borderline high levels are 150-199 mg/dl. High levels are 200 - 499 mg/dL, and very high triglyceride levels are greater than 500 mg/dL. The decision to treat high triglycerides is generally based on the level. For people with borderline or high triglyceride levels, treatment includes weight loss and exercise. Drugs are recommended for people with very high triglyceride levels. Many people who need treatment for high triglyceride levels have metabolic syndrome. This syndrome is a collection of disorders that often include: insulin resistance, high blood pressure, blood clotting problems, high cholesterol and triglycerides. TESTING PROCEDURE FOR TRIGLYCERIDES  You should not eat 4 hours before getting your triglycerides measured. The normal range of triglycerides is between 10 and 250 milligrams per deciliter (mg/dl). Some people may have extreme levels (1000 or above), but your triglyceride level may be too high if it is above 150 mg/dl, depending on what other risk factors you have for heart disease.  People with high blood triglycerides may also have high blood cholesterol levels. If you have high blood cholesterol as well as high blood triglycerides, your risk for heart disease is probably greater than if you only had high triglycerides. High blood cholesterol is one of the main risk factors for heart disease. CHANGING YOUR DIET  Your weight can affect your blood triglyceride level. If you are more than 20% above your ideal body weight, you may be able to lower your blood triglycerides by losing weight. Eating less and exercising regularly is the best way to combat this. Fat provides more  calories than any other food. The best way to lose weight is to eat less fat. Only 30% of your total calories should come from fat. Less than 7% of your diet should come from saturated fat. A diet low in fat and saturated fat is the same as a diet to decrease blood cholesterol. By eating a diet lower in fat, you may lose weight, lower your blood cholesterol, and lower your blood triglyceride level.  Eating a diet low in fat, especially saturated fat, may also help you lower your blood triglyceride level. Ask your dietitian to help you figure how much fat you can eat based on the number of calories your caregiver has prescribed for you.  Exercise, in addition to helping with weight loss may also help lower triglyceride levels.   Alcohol can increase blood triglycerides. You may need to stop drinking alcoholic beverages.  Too much carbohydrate in your diet may also increase your blood triglycerides. Some complex carbohydrates are necessary in your diet. These may include bread, rice, potatoes, other starchy vegetables and cereals.  Reduce "simple" carbohydrates. These may include pure sugars, candy, honey, and jelly without losing other nutrients. If you have the kind of high blood triglycerides that is affected by the amount of carbohydrates in your diet, you will need to eat less sugar and less high-sugar foods. Your caregiver can help you with this.  Adding 2-4 grams of fish oil (EPA+ DHA) may also help lower triglycerides. Speak with your caregiver before adding any supplements to your regimen. Following the Diet  Maintain your ideal weight. Your caregivers can help you with a diet. Generally, eating less food and getting more   exercise will help you lose weight. Joining a weight control group may also help. Ask your caregivers for a good weight control group in your area.  Eat low-fat foods instead of high-fat foods. This can help you lose weight too.  These foods are lower in fat. Eat MORE of these:    Dried beans, peas, and lentils.  Egg whites.  Low-fat cottage cheese.  Fish.  Lean cuts of meat, such as round, sirloin, rump, and flank (cut extra fat off meat you fix).  Whole grain breads, cereals and pasta.  Skim and nonfat dry milk.  Low-fat yogurt.  Poultry without the skin.  Cheese made with skim or part-skim milk, such as mozzarella, parmesan, farmers', ricotta, or pot cheese. These are higher fat foods. Eat LESS of these:   Whole milk and foods made from whole milk, such as American, blue, cheddar, monterey jack, and swiss cheese  High-fat meats, such as luncheon meats, sausages, knockwurst, bratwurst, hot dogs, ribs, corned beef, ground pork, and regular ground beef.  Fried foods. Limit saturated fats in your diet. Substituting unsaturated fat for saturated fat may decrease your blood triglyceride level. You will need to read package labels to know which products contain saturated fats.  These foods are high in saturated fat. Eat LESS of these:   Fried pork skins.  Whole milk.  Skin and fat from poultry.  Palm oil.  Butter.  Shortening.  Cream cheese.  Bacon.  Margarines and baked goods made from listed oils.  Vegetable shortenings.  Chitterlings.  Fat from meats.  Coconut oil.  Palm kernel oil.  Lard.  Cream.  Sour cream.  Fatback.  Coffee whiteners and non-dairy creamers made with these oils.  Cheese made from whole milk. Use unsaturated fats (both polyunsaturated and monounsaturated) moderately. Remember, even though unsaturated fats are better than saturated fats; you still want a diet low in total fat.  These foods are high in unsaturated fat:   Canola oil.  Sunflower oil.  Mayonnaise.  Almonds.  Peanuts.  Pine nuts.  Margarines made with these oils.  Safflower oil.  Olive oil.  Avocados.  Cashews.  Peanut butter.  Sunflower seeds.  Soybean oil.  Peanut  oil.  Olives.  Pecans.  Walnuts.  Pumpkin seeds. Avoid sugar and other high-sugar foods. This will decrease carbohydrates without decreasing other nutrients. Sugar in your food goes rapidly to your blood. When there is excess sugar in your blood, your liver may use it to make more triglycerides. Sugar also contains calories without other important nutrients.  Eat LESS of these:   Sugar, brown sugar, powdered sugar, jam, jelly, preserves, honey, syrup, molasses, pies, candy, cakes, cookies, frosting, pastries, colas, soft drinks, punches, fruit drinks, and regular gelatin.  Avoid alcohol. Alcohol, even more than sugar, may increase blood triglycerides. In addition, alcohol is high in calories and low in nutrients. Ask for sparkling water, or a diet soft drink instead of an alcoholic beverage. Suggestions for planning and preparing meals   Bake, broil, grill or roast meats instead of frying.  Remove fat from meats and skin from poultry before cooking.  Add spices, herbs, lemon juice or vinegar to vegetables instead of salt, rich sauces or gravies.  Use a non-stick skillet without fat or use no-stick sprays.  Cool and refrigerate stews and broth. Then remove the hardened fat floating on the surface before serving.  Refrigerate meat drippings and skim off fat to make low-fat gravies.  Serve more fish.  Use less butter,   margarine and other high-fat spreads on bread or vegetables.  Use skim or reconstituted non-fat dry milk for cooking.  Cook with low-fat cheeses.  Substitute low-fat yogurt or cottage cheese for all or part of the sour cream in recipes for sauces, dips or congealed salads.  Use half yogurt/half mayonnaise in salad recipes.  Substitute evaporated skim milk for cream. Evaporated skim milk or reconstituted non-fat dry milk can be whipped and substituted for whipped cream in certain recipes.  Choose fresh fruits for dessert instead of high-fat foods such as pies or  cakes. Fruits are naturally low in fat. When Dining Out   Order low-fat appetizers such as fruit or vegetable juice, pasta with vegetables or tomato sauce.  Select clear, rather than cream soups.  Ask that dressings and gravies be served on the side. Then use less of them.  Order foods that are baked, broiled, poached, steamed, stir-fried, or roasted.  Ask for margarine instead of butter, and use only a small amount.  Drink sparkling water, unsweetened tea or coffee, or diet soft drinks instead of alcohol or other sweet beverages. QUESTIONS AND ANSWERS ABOUT OTHER FATS IN THE BLOOD: SATURATED FAT, TRANS FAT, AND CHOLESTEROL What is trans fat? Trans fat is a type of fat that is formed when vegetable oil is hardened through a process called hydrogenation. This process helps makes foods more solid, gives them shape, and prolongs their shelf life. Trans fats are also called hydrogenated or partially hydrogenated oils.  What do saturated fat, trans fat, and cholesterol in foods have to do with heart disease? Saturated fat, trans fat, and cholesterol in the diet all raise the level of LDL "bad" cholesterol in the blood. The higher the LDL cholesterol, the greater the risk for coronary heart disease (CHD). Saturated fat and trans fat raise LDL similarly.  What foods contain saturated fat, trans fat, and cholesterol? High amounts of saturated fat are found in animal products, such as fatty cuts of meat, chicken skin, and full-fat dairy products like butter, whole milk, cream, and cheese, and in tropical vegetable oils such as palm, palm kernel, and coconut oil. Trans fat is found in some of the same foods as saturated fat, such as vegetable shortening, some margarines (especially hard or stick margarine), crackers, cookies, baked goods, fried foods, salad dressings, and other processed foods made with partially hydrogenated vegetable oils. Small amounts of trans fat also occur naturally in some animal  products, such as milk products, beef, and lamb. Foods high in cholesterol include liver, other organ meats, egg yolks, shrimp, and full-fat dairy products. How can I use the new food label to make heart-healthy food choices? Check the Nutrition Facts panel of the food label. Choose foods lower in saturated fat, trans fat, and cholesterol. For saturated fat and cholesterol, you can also use the Percent Daily Value (%DV): 5% DV or less is low, and 20% DV or more is high. (There is no %DV for trans fat.) Use the Nutrition Facts panel to choose foods low in saturated fat and cholesterol, and if the trans fat is not listed, read the ingredients and limit products that list shortening or hydrogenated or partially hydrogenated vegetable oil, which tend to be high in trans fat. POINTS TO REMEMBER:   Discuss your risk for heart disease with your caregivers, and take steps to reduce risk factors.  Change your diet. Choose foods that are low in saturated fat, trans fat, and cholesterol.  Add exercise to your daily routine if   it is not already being done. Participate in physical activity of moderate intensity, like brisk walking, for at least 30 minutes on most, and preferably all days of the week. No time? Break the 30 minutes into three, 10-minute segments during the day.  Stop smoking. If you do smoke, contact your caregiver to discuss ways in which they can help you quit.  Do not use street drugs.  Maintain a normal weight.  Maintain a healthy blood pressure.  Keep up with your blood work for checking the fats in your blood as directed by your caregiver. Document Released: 11/22/2003 Document Revised: 08/05/2011 Document Reviewed: 06/19/2008 [employer] Patient Information 2014 [employer].  

## 2012-12-17 ENCOUNTER — Ambulatory Visit: Payer: 59 | Admitting: Nurse Practitioner

## 2012-12-17 ENCOUNTER — Ambulatory Visit: Payer: 59 | Admitting: General Practice

## 2012-12-17 LAB — LIPID PANEL
Chol/HDL Ratio: 2.9 ratio units (ref 0.0–4.4)
HDL: 70 mg/dL (ref >39)
Triglycerides: 169 mg/dL — ABNORMAL HIGH (ref 0–149)

## 2012-12-17 LAB — CMP14+EGFR
ALT: 21 IU/L (ref 0–32)
AST: 16 IU/L (ref 0–40)
Albumin/Globulin Ratio: 1.2 (ref 1.1–2.5)
Alkaline Phosphatase: 64 IU/L (ref 39–117)
BUN/Creatinine Ratio: 12 (ref 11–26)
BUN: 9 mg/dL (ref 8–27)
CO2: 28 mmol/L (ref 18–29)
Calcium: 10.3 mg/dL — ABNORMAL HIGH (ref 8.6–10.2)
Chloride: 97 mmol/L (ref 97–108)
GFR calc Af Amer: 99 mL/min/{1.73_m2} (ref >59)
Potassium: 4.2 mmol/L (ref 3.5–5.2)
Sodium: 140 mmol/L (ref 134–144)

## 2012-12-17 LAB — ARTHRITIS PANEL
Rhuematoid fact SerPl-aCnc: 17.3 IU/mL — ABNORMAL HIGH (ref 0.0–13.9)
Sed Rate: 33 mm/hr (ref 0–40)
Uric Acid: 7.1 mg/dL (ref 2.5–7.1)

## 2012-12-22 ENCOUNTER — Other Ambulatory Visit: Payer: Self-pay | Admitting: General Practice

## 2012-12-22 DIAGNOSIS — M1990 Unspecified osteoarthritis, unspecified site: Secondary | ICD-10-CM

## 2012-12-22 DIAGNOSIS — M2550 Pain in unspecified joint: Secondary | ICD-10-CM

## 2012-12-23 ENCOUNTER — Other Ambulatory Visit: Payer: Self-pay

## 2012-12-30 ENCOUNTER — Telehealth: Payer: Self-pay

## 2012-12-30 NOTE — Telephone Encounter (Signed)
Please call me about appt you made for me in [facility]

## 2012-12-31 NOTE — Telephone Encounter (Signed)
Spoke with patient and she informed me that her daughter would be driving her to scheduled appointment in [place].

## 2013-01-04 ENCOUNTER — Ambulatory Visit: Payer: 59 | Admitting: Cardiology

## 2013-01-05 ENCOUNTER — Ambulatory Visit: Payer: 59 | Admitting: Cardiology

## 2013-01-07 ENCOUNTER — Encounter: Payer: Self-pay | Admitting: Cardiology

## 2013-01-07 ENCOUNTER — Ambulatory Visit (INDEPENDENT_AMBULATORY_CARE_PROVIDER_SITE_OTHER): Payer: 59 | Admitting: Cardiology

## 2013-01-07 ENCOUNTER — Ambulatory Visit: Payer: 59 | Admitting: Cardiology

## 2013-01-07 VITALS — BP 149/71 | HR 61 | Ht 62.0 in | Wt 201.0 lb

## 2013-01-07 DIAGNOSIS — I10 Essential (primary) hypertension: Secondary | ICD-10-CM

## 2013-01-07 DIAGNOSIS — E785 Hyperlipidemia, unspecified: Secondary | ICD-10-CM

## 2013-01-07 DIAGNOSIS — I739 Peripheral vascular disease, unspecified: Secondary | ICD-10-CM

## 2013-01-07 DIAGNOSIS — I2510 Atherosclerotic heart disease of native coronary artery without angina pectoris: Secondary | ICD-10-CM

## 2013-01-07 DIAGNOSIS — R002 Palpitations: Secondary | ICD-10-CM

## 2013-01-07 NOTE — Progress Notes (Signed)
Clinical Summary Ms. Ohmer is a 62 y.o.female last seen by NP Serpe, this is our first visit together. He comes in today for management of the following medical problems.  1. CAD - prior NSTEMI 04/2012 with DES to LAD - denies any chest pain. Denies any SOB or DOE. Physically limited due to joint and leg pain, but denies symptoms - compliant with meds: ASA, ticagrelor, pravastatin, metoprolol, lisinopril  2. PAD - previous angiography 07/2012 by Dr Kirke Corin with significant disease, per notes goal for intensive medical therapy including walking program and starting pletal after DAPT completed for her NSEMI - has been walking daily 5 minutes on treadmill daily.  3. Chronic diastolic heart failure - denis any SOB, DOE, orthopnea, or PND - compliant with diuretic, limits sodium intake. Occasionally takes advil but rarely.   4. HTN - checks bp at home rarely - compliant with meds  5. Hyperlipidemia - compliant with statin - recent panel as reported below. Reports muscle aches on lipitor, tolerating pravastatin.   6. Palpitations - started about 1 year ago - feeling of heart racing, occurs 1-2 times per week. No associated SOB or lightheadedness. Lasts up to 30 minutes. - does not drink caffeine, no alchol. TSH 1.63   Past Medical History  Diagnosis Date  . Hypertension   . Diabetes mellitus without complication   . Hyperlipidemia   . CAD (coronary artery disease)     a. 04/2012 NSTEMI: s/p DES to LAD.  Marland Kitchen PVD (peripheral vascular disease)     a. 04/2012 ABI: R 0.49, L 0.39. Angiography 06/14: Significant ostial left common iliac artery stenosis extending into the distal aorta, severe left common femoral artery stenosis, bilateral SFA occlusion with heavy calcifications. Functionally, one-vessel runoff bilaterally below the knee  . Chronic diastolic CHF (congestive heart failure)   . Leukocytosis   . Morbid obesity   . NSTEMI (non-ST elevated myocardial infarction) 04/27/2012    . Dysrhythmia   . Shortness of breath      No Known Allergies   Current Outpatient Prescriptions  Medication Sig Dispense Refill  . albuterol (PROVENTIL HFA;VENTOLIN HFA) 108 (90 BASE) MCG/ACT inhaler Inhale 1-2 puffs into the lungs every 6 (six) hours as needed for wheezing or shortness of breath.      Marland Kitchen amLODipine (NORVASC) 10 MG tablet Take 1 tablet (10 mg total) by mouth daily.  30 tablet  6  . Ascorbic Acid (VITAMIN C) 500 MG CAPS Take by mouth.      Marland Kitchen aspirin EC 81 MG tablet Take 81 mg by mouth 2 (two) times daily.      . Calcium Carbonate-Vit D-Min (CALTRATE 600+D PLUS MINERALS) 600-800 MG-UNIT TABS Take 1 tablet by mouth 2 (two) times daily.      . colesevelam (WELCHOL) 625 MG tablet Take 3 tablets (1,875 mg total) by mouth 2 (two) times daily with a meal.  180 tablet  3  . fish oil-omega-3 fatty acids 1000 MG capsule Take 1 g by mouth every morning.      . furosemide (LASIX) 20 MG tablet Take 1 tablet (20 mg total) by mouth daily.  30 tablet  3  . glucose blood test strip 1 each by Other route as needed ([employer] OTC brand). Use as instructed      . hydroxypropyl methylcellulose (ISOPTO TEARS) 2.5 % ophthalmic solution Place 1 drop into both eyes 3 (three) times daily as needed (for dry eyes).      . insulin aspart (NOVOLOG)  100 UNIT/ML injection Inject 5-20 Units into the skin 3 (three) times daily as needed for high blood sugar. For CBG greater than 250 take 5 units; for every 50 above that take an additional 5 units  15 pen  0  . Insulin Aspart Prot & Aspart (70-30) 100 UNIT/ML SUPN Inject 98 Units into the skin 3 (three) times daily with meals.  85 pen  1  . lisinopril (PRINIVIL,ZESTRIL) 20 MG tablet Take 1 tablet (20 mg total) by mouth 2 (two) times daily.  60 tablet  6  . metoprolol (LOPRESSOR) 50 MG tablet Take 1 tablet (50 mg total) by mouth 2 (two) times daily.  60 tablet  3  . montelukast (SINGULAIR) 10 MG tablet Take 1 tablet (10 mg total) by mouth at bedtime.  30 tablet   3  . Multiple Vitamin (MULTIVITAMIN WITH MINERALS) TABS Take 1 tablet by mouth daily.      . nitroGLYCERIN (NITROSTAT) 0.4 MG SL tablet Place 1 tablet (0.4 mg total) under the tongue every 5 (five) minutes as needed for chest pain (up to 3 doses).  25 tablet  4  . pravastatin (PRAVACHOL) 80 MG tablet Take 1 tablet (80 mg total) by mouth at bedtime.  30 tablet  3  . Ticagrelor (BRILINTA) 90 MG TABS tablet Take 90 mg by mouth 2 (two) times daily.       No current facility-administered medications for this visit.     Past Surgical History  Procedure Laterality Date  . Tumor removal    . Lad stent       No Known Allergies    No family history on file.   Social History Ms. Duhamel reports that she quit smoking about 11 years ago. She quit smokeless tobacco use about 11 years ago. Ms. Brake reports that she does not drink alcohol.   Review of Systems CONSTITUTIONAL: No weight loss, fever, chills, weakness or fatigue.  HEENT: Eyes: No visual loss, blurred vision, double vision or yellow sclerae.No hearing loss, sneezing, congestion, runny nose or sore throat.  SKIN: No rash or itching.  CARDIOVASCULAR: per HPI RESPIRATORY: per HPI GASTROINTESTINAL: No anorexia, nausea, vomiting or diarrhea. No abdominal pain or blood.  GENITOURINARY: No burning on urination, no polyuria NEUROLOGICAL: No headache, dizziness, syncope, paralysis, ataxia, numbness or tingling in the extremities. No change in bowel or bladder control.  MUSCULOSKELETAL: chronic leg pain, joint pain LYMPHATICS: No enlarged nodes. No history of splenectomy.  PSYCHIATRIC: No history of depression or anxiety.  ENDOCRINOLOGIC: No reports of sweating, cold or heat intolerance. No polyuria or polydipsia.  Marland Kitchen   Physical Examination p 61 bp 130/60 Wt 201 lbs BMI 37 Gen: resting comfortably, no acute distress HEENT: no scleral icterus, pupils equal round and reactive, no palptable cervical adenopathy,  CV: RRR, 2/6  systolic murmur RUSB early peaking, no JVD, no carotid bruits Resp: Clear to auscultation bilaterally GI: abdomen is soft, non-tender, non-distended, normal bowel sounds, no hepatosplenomegaly MSK: extremities are warm, no edema.  Skin: warm, no rash Neuro:  no focal deficits Psych: appropriate affect   Diagnostic Studies 04/2012 Echo: LVEF 55-60%, no WMA, mild LVH, grade I diastolic dysfunction  05/2012 ABI: Right 0.57 left 0.48. Moderate left iliac disease, distal right SFA occlusion with reconstitution at the popliteal.   04/2012 Carotid US: bilateral plaque without significant stenosis.  04/2012 Cardiac Cath PROCEDURAL FINDINGS  Hemodynamics:  AO 155/58 mean 91 mm Hg  LV 163/13 mm Hg  Coronary angiography:  Coronary dominance: right  Left mainstem: Short with shared coronary ostia for the LAD and LCX.  Left anterior descending (LAD): diffuse 30-40% proximal. 90% focal lesion after the first diagonal.  Left circumflex (LCx): <20% proximal and mid. 80% very small distal OM.  Right coronary artery (RCA): Diffuse 20% proximal and mid vessel disease. Moderate calcification.  Left ventriculography: Left ventricular systolic function is normal, LVEF is estimated at 55-65%, there is no significant mitral regurgitation  PCI Note: Following the diagnostic procedure, the decision was made to proceed with PCI. The radial sheath was upsized to a 6 [place]. Bilinta 180 mg was given orally. Weight-based bivalirudin was given for anticoagulation. Once a therapeutic ACT was achieved, a 6 [place] XB 3.0 guide catheter was inserted. A prowater coronary guidewire was used to cross the lesion. The lesion was predilated with a 2.5 mm balloon. The lesion was then stented with a 3.0x 16 mm Promus premier stent. The stent was postdilated with a 3.25 mm noncompliant balloon. Following PCI, there was 0% residual stenosis and TIMI-3 flow. Final angiography confirmed an excellent result. The patient tolerated the  procedure well. There were no immediate procedural complications. A TR band was used for radial hemostasis. The patient was transferred to the post catheterization recovery area for further monitoring.  PCI Data:  Vessel - LAD/Segment - mid  Percent Stenosis (pre) 90%  TIMI-flow 3  Stent 3.0 x 16 mm Promus premier  Percent Stenosis (post) 0%  TIMI-flow (post) 3  Final Conclusions:  1. Single vessel obstructive CAD  2. Normal LV function.  3. Successful stenting of the mid LAD with a DES.    07/2012 Lower Extremity Angiography Hemodynamics:  Central Aortic Pressure / Mean Aortic Pressure: 157/70  Findings:  Abdominal aorta: Normal in size with no aneurysm. There is mild atherosclerosis below the renal arteries. Distally above the iliac bifurcation there is a calcified plaque at the origin of the left external iliac causing 30% stenosis.  Left renal artery: Normal  Right renal artery: Normal  Celiac artery: Patent  Superior mesenteric artery: Patent  Right common iliac artery: Diffuse 20% disease.  Right internal iliac artery: Normal  Right external iliac artery: Normal  Right common femoral artery: Minor irregularities.  Right profunda femoral artery: Diffuse 20% disease proximally.  Right superficial femoral artery: Diffuse atherosclerosis throughout its course. There is 20% disease at the ostium. This is followed by 70% stenosis proximally. There is another 70% stenosis in the midsegment followed by an occlusion distally Reconstituting in the proximal popliteal artery. This segment is about 50 mm and heavily calcified.  Right popliteal artery: 20-30% disease proximally.  Right tibial peroneal trunk: Normal  Right anterior tibial artery: Diffusely diseased proximally and seems to be occluded in the midsegment.  Right peroneal artery: Patent but diffusely diseased.  Right posterior tibial artery: Patent and the dominant vessel below the knee. It has minor irregularities.  Left common  iliac artery: There is a 70-80 % heavily calcified lesion proximally.  Left internal iliac artery: Diffusely diseased.  Left external iliac artery: Minor irregularities.  Left common femoral artery: Heavily calcified with diffuse 90% disease extending into the ostium of the SFA.  Left profunda femoral artery: Normal.  Left superficial femoral artery: 70% ostial disease with heavy calcifications. Diffuse 40% disease proximally. The vessel is occluded in the midsegment with reconstitution distally via collaterals from the profunda.  Left popliteal artery: Minor irregularities.  Left tibial peroneal trunk: Minor irregularities.  Left anterior tibial artery: Patent. This is the dominant  vessel.  Left peroneal artery: Patent but diffusely diseased.  Left posterior tibial artery: Occlude proximally. Conclusions:  1. Significant heavily calcified ostial left common iliac artery stenosis with plaque extending into the distal aorta.  2. Severe heavily calcified disease in the left common femoral artery.  3. Bilateral SFA occlusion with heavy calcifications.  4. Significant below the knee disease bilaterally as outlined above.  Recommendations:  The patient has diffuse and heavily calcified peripheral arterial disease. Revascularization options are somewhat difficult by endovascular means. There is heavily calcified disease in the left common femoral artery which is not approachable by endovascular options. Endarterectomy is the best option for this. The stenosis in the left common iliac artery can be treated with stenting but will require likely stenting the distal aorta as well. Both SFAs are diffusely diseased, calcified with chronic occlusion. We'll attempt medical therapy and a walking program for now.    11/2012 Labs: 140 K 4.2 Cl 97 Cr 0.75 BUN 9 AST 16 ALT 21 T.bili 0.2 TC 205 TG 169 HDL 70 LDL 101    Assessment and Plan  1. CAD - no current symptoms - continue brilinta until 04/2013,  continue all other medications and risk factor modifciation   2. PAD - continuing walking program, after completes DAPT will start cilostazol. Likely have her follow up with Dr Kirke Corin as well.  3. Hyperlipidemia - LDL not at goal (goal <70 based on history of DM and CAD), however on more potent statin she had significant myalgias in the past. Tolerating pravastatin, continue current therapy. On welchol per PCP, ok to continue.  4. Palpitations - obtain 14 day event monitor  5. Chronic diastolic heart failure - euvolemic in clinic today, no significant symptoms - continue bp control and current diuretic dosing.  6. HTN - at goal (<130/80 given her DM), continue current therapy   Follow up in 3 months     Antoine Poche, M.D., F.A.C.C.

## 2013-01-07 NOTE — Patient Instructions (Signed)
Your physician recommends that you schedule a follow-up appointment in: 3 month with Dr. Wyline Mood. This appointment will be scheduled today before you leave.  Your physician recommends that you continue on your current medications as directed. Please refer to the Current Medication list given to you today.  Your physician has recommended that you wear an event monitor. Event monitors are medical devices that record the heart's electrical activity. Doctors most often [place] these monitors to diagnose arrhythmias. Arrhythmias are problems with the speed or rhythm of the heartbeat. The monitor is a small, portable device. You can wear one while you do your normal daily activities. This is usually used to diagnose what is causing palpitations/syncope (passing out).

## 2013-01-11 ENCOUNTER — Other Ambulatory Visit: Payer: Self-pay

## 2013-01-11 MED ORDER — LISINOPRIL 20 MG PO TABS: 20.0000 mg | ORAL_TABLET | Freq: Two times a day (BID) | ORAL | Status: DC

## 2013-01-25 DIAGNOSIS — R002 Palpitations: Secondary | ICD-10-CM

## 2013-02-01 ENCOUNTER — Telehealth: Payer: Self-pay | Admitting: General Practice

## 2013-02-07 ENCOUNTER — Other Ambulatory Visit: Payer: Self-pay | Admitting: *Deleted

## 2013-02-24 ENCOUNTER — Telehealth: Payer: Self-pay | Admitting: General Practice

## 2013-02-25 ENCOUNTER — Other Ambulatory Visit: Payer: Self-pay | Admitting: General Practice

## 2013-02-25 DIAGNOSIS — E119 Type 2 diabetes mellitus without complications: Secondary | ICD-10-CM

## 2013-02-25 MED ORDER — INSULIN ASPART PROT & ASPART (70-30 MIX) 100 UNIT/ML PEN: 98.0000 [IU] | PEN_INJECTOR | Freq: Three times a day (TID) | SUBCUTANEOUS | Status: DC

## 2013-02-25 NOTE — Telephone Encounter (Signed)
Script sent to pharmacy.

## 2013-03-04 ENCOUNTER — Telehealth: Payer: Self-pay | Admitting: General Practice

## 2013-03-04 DIAGNOSIS — E119 Type 2 diabetes mellitus without complications: Secondary | ICD-10-CM

## 2013-03-04 NOTE — Telephone Encounter (Signed)
Patient needs her insulin to be six pens instead of three pens she needs 2 boxes instead of 1 box. I called the pharmacy and the prescription that we sent was right and I spoke with the pharmacist and she is going to call the insurance company and see if she can get it to go through.

## 2013-03-21 ENCOUNTER — Ambulatory Visit: Payer: 59 | Admitting: General Practice

## 2013-04-01 ENCOUNTER — Encounter: Payer: Self-pay | Admitting: General Practice

## 2013-04-01 ENCOUNTER — Ambulatory Visit (INDEPENDENT_AMBULATORY_CARE_PROVIDER_SITE_OTHER): Payer: 59 | Admitting: General Practice

## 2013-04-01 VITALS — BP 128/63 | HR 69 | Temp 97.1°F | Ht 62.0 in | Wt 203.0 lb

## 2013-04-01 DIAGNOSIS — J309 Allergic rhinitis, unspecified: Secondary | ICD-10-CM

## 2013-04-01 DIAGNOSIS — E785 Hyperlipidemia, unspecified: Secondary | ICD-10-CM

## 2013-04-01 DIAGNOSIS — E109 Type 1 diabetes mellitus without complications: Secondary | ICD-10-CM

## 2013-04-01 DIAGNOSIS — I10 Essential (primary) hypertension: Secondary | ICD-10-CM

## 2013-04-01 LAB — POCT GLYCOSYLATED HEMOGLOBIN (HGB A1C): HEMOGLOBIN A1C: 7.9

## 2013-04-01 MED ORDER — PRAVASTATIN SODIUM 80 MG PO TABS: 80.0000 mg | ORAL_TABLET | Freq: Every day | ORAL | Status: DC

## 2013-04-01 MED ORDER — AMLODIPINE BESYLATE 10 MG PO TABS: 10.0000 mg | ORAL_TABLET | Freq: Every day | ORAL | Status: DC

## 2013-04-01 MED ORDER — "PEN NEEDLES 1/2"" 29G X 12MM MISC": 1.0000 | Freq: Three times a day (TID) | Status: DC

## 2013-04-01 MED ORDER — MONTELUKAST SODIUM 10 MG PO TABS: 10.0000 mg | ORAL_TABLET | Freq: Every day | ORAL | Status: DC

## 2013-04-01 NOTE — Patient Instructions (Signed)

## 2013-04-02 LAB — CMP14+EGFR
ALK PHOS: 72 IU/L (ref 39–117)
ALT: 26 IU/L (ref 0–32)
AST: 18 IU/L (ref 0–40)
Albumin/Globulin Ratio: 1.3 (ref 1.1–2.5)
Albumin: 4.5 g/dL (ref 3.6–4.8)
BUN/Creatinine Ratio: 16 (ref 11–26)
BUN: 10 mg/dL (ref 8–27)
CALCIUM: 10.1 mg/dL (ref 8.7–10.3)
CHLORIDE: 95 mmol/L — AB (ref 97–108)
CO2: 32 mmol/L — AB (ref 18–29)
CREATININE: 0.64 mg/dL (ref 0.57–1.00)
GFR calc Af Amer: 111 mL/min/{1.73_m2} (ref >59)
GFR calc non Af Amer: 97 mL/min/{1.73_m2} (ref >59)
GLOBULIN, TOTAL: 3.6 g/dL (ref 1.5–4.5)
Glucose: 85 mg/dL (ref 65–99)
POTASSIUM: 4 mmol/L (ref 3.5–5.2)
SODIUM: 140 mmol/L (ref 134–144)
Total Bilirubin: 0.3 mg/dL (ref 0.0–1.2)
Total Protein: 8.1 g/dL (ref 6.0–8.5)

## 2013-04-02 LAB — LIPID PANEL
Chol/HDL Ratio: 2.9 ratio (ref 0.0–4.4)
Cholesterol, Total: 220 mg/dL — ABNORMAL HIGH (ref 100–199)
HDL: 75 mg/dL
LDL Calculated: 106 mg/dL — ABNORMAL HIGH (ref 0–99)
Triglycerides: 193 mg/dL — ABNORMAL HIGH (ref 0–149)
VLDL Cholesterol Cal: 39 mg/dL (ref 5–40)

## 2013-04-03 NOTE — Progress Notes (Signed)
   Subjective:    Patient ID: Leah Ferrell, female    DOB: [birth year], 63 y.o.   MRN: [id number]  HPI Patient presents today for chronic health follow up. History of diabetes type I, hypertension, and hyperlipidemia. Checking blood sugars three times daily and range 90-200. Denies eating a healthy diet due to change in family living. Reports her daughter is currently living with her.      Review of Systems  Constitutional: Negative for fever and chills.  Respiratory: Negative for chest tightness and shortness of breath.   Cardiovascular: Negative for chest pain and palpitations.  Gastrointestinal: Negative for nausea, vomiting, abdominal pain and blood in stool.  Genitourinary: Negative for hematuria and difficulty urinating.  Musculoskeletal: Negative for neck pain and neck stiffness.  Neurological: Negative for dizziness, weakness and headaches.       Objective:   Physical Exam  Constitutional: She is oriented to person, place, and time. She appears well-developed and well-nourished.  HENT:  Head: Normocephalic and atraumatic.  Right Ear: External ear normal.  Left Ear: External ear normal.  Nose: Nose normal.  Mouth/Throat: Oropharynx is clear and moist.  Eyes: Conjunctivae and EOM are normal. Pupils are equal, round, and reactive to light.  Neck: Normal range of motion. Neck supple.  Cardiovascular: Normal rate, regular rhythm and normal heart sounds.   Pulmonary/Chest: Effort normal and breath sounds normal.  Abdominal: Soft. Bowel sounds are normal. She exhibits no distension. There is no tenderness.  Neurological: She is alert and oriented to person, place, and time.  Skin: Skin is warm and dry.  Psychiatric: She has a normal mood and affect.          Assessment & Plan:  1. HLD (hyperlipidemia)  - Lipid panel  2. Diabetes mellitus type 1  - POCT glycosylated hemoglobin (Hb A1C) - Insulin Pen Needle (PEN NEEDLES 29GX1/2") 29G X 12MM MISC; 1 Container by Does  not apply route 3 (three) times daily.  Dispense: 50 each; Refill: 11  3. HTN (hypertension)  - CMP14+EGFR - amLODipine (NORVASC) 10 MG tablet; Take 1 tablet (10 mg total) by mouth daily.  Dispense: 30 tablet; Refill: 6  4. Allergic rhinitis  - montelukast (SINGULAIR) 10 MG tablet; Take 1 tablet (10 mg total) by mouth at bedtime.  Dispense: 30 tablet; Refill: 3  5. Hyperlipidemia  - pravastatin (PRAVACHOL) 80 MG tablet; Take 1 tablet (80 mg total) by mouth at bedtime.  Dispense: 30 tablet; Refill: 3 -Continue all current medications Labs pending F/u in 3 months Discussed benefits of regular exercise and healthy eating Patient verbalized understanding Erby Pian, FNP-C

## 2013-04-09 ENCOUNTER — Other Ambulatory Visit: Payer: Self-pay | Admitting: General Practice

## 2013-04-15 ENCOUNTER — Encounter: Payer: 59 | Admitting: Cardiology

## 2013-04-15 NOTE — Progress Notes (Signed)
ERROR

## 2013-05-05 ENCOUNTER — Telehealth: Payer: Self-pay | Admitting: General Practice

## 2013-05-05 ENCOUNTER — Other Ambulatory Visit: Payer: Self-pay | Admitting: *Deleted

## 2013-05-05 DIAGNOSIS — E119 Type 2 diabetes mellitus without complications: Secondary | ICD-10-CM

## 2013-05-05 MED ORDER — INSULIN ASPART PROT & ASPART (70-30 MIX) 100 UNIT/ML PEN: 98.0000 [IU] | PEN_INJECTOR | Freq: Three times a day (TID) | SUBCUTANEOUS | Status: DC

## 2013-05-05 NOTE — Telephone Encounter (Signed)
Can you review refill please/ thanks

## 2013-05-10 ENCOUNTER — Encounter: Payer: Self-pay | Admitting: Cardiology

## 2013-05-10 ENCOUNTER — Encounter: Payer: 59 | Admitting: Cardiology

## 2013-05-10 NOTE — Progress Notes (Signed)
   ERROR  Jamar Casagrande F. Ariba Lehnen, M.D., F.A.C.C.  

## 2013-05-24 ENCOUNTER — Encounter: Payer: Self-pay | Admitting: Cardiology

## 2013-05-24 ENCOUNTER — Ambulatory Visit (INDEPENDENT_AMBULATORY_CARE_PROVIDER_SITE_OTHER): Payer: 59 | Admitting: Cardiology

## 2013-05-24 VITALS — BP 128/67 | HR 64 | Ht 62.0 in | Wt 203.0 lb

## 2013-05-24 DIAGNOSIS — I2510 Atherosclerotic heart disease of native coronary artery without angina pectoris: Secondary | ICD-10-CM

## 2013-05-24 DIAGNOSIS — I5032 Chronic diastolic (congestive) heart failure: Secondary | ICD-10-CM

## 2013-05-24 DIAGNOSIS — I739 Peripheral vascular disease, unspecified: Secondary | ICD-10-CM

## 2013-05-24 DIAGNOSIS — I10 Essential (primary) hypertension: Secondary | ICD-10-CM

## 2013-05-24 DIAGNOSIS — R002 Palpitations: Secondary | ICD-10-CM

## 2013-05-24 MED ORDER — CILOSTAZOL 50 MG PO TABS: 50.0000 mg | ORAL_TABLET | Freq: Two times a day (BID) | ORAL | Status: DC

## 2013-05-24 NOTE — Progress Notes (Signed)
Clinical Summary Ms. Stierwalt is a 63 y.o.female seen today for follow up of the following medical problems.   1. CAD  - prior NSTEMI 04/2012 with DES to LAD  - denies any chest pain. Denies any SOB or DOE. Physically limited due to joint and leg pain, but denies symptoms  - compliant with meds   2. PAD  - previous angiography 07/2012 by Dr Fletcher [name] with significant disease as descirbed below, per notes goal for intensive medical therapy including walking program and starting pletal after DAPT completed for her NSTEMI  - has been walking daily  - fairly mixed symptoms in her left leg with exertion, describes diffuse pain throughout entire leg including joints, does not occur at rest.    3. Chronic diastolic heart failure  - denis any SOB, DOE, orthopnea, or PND  - compliant with diuretic, limits sodium intake. Occasionally takes advil but rarely.   4. HTN  - does not check regularly - compliant with meds   5. Hyperlipidemia  - compliant with statin  - 03/2013 TC 220 TG 193 HDL 49 LDL 106 - does not want stronger statin, prior side effects on lipitor  6. Palpitations  - denies any significant recent palpitations.  - last visit setup a 14 day event monitor which showed no symptoms reported, no arrythmias  Past Medical History  Diagnosis Date  . Hypertension   . Diabetes mellitus without complication   . Hyperlipidemia   . CAD (coronary artery disease)     a. 04/2012 NSTEMI: s/p DES to LAD.  Marland Kitchen PVD (peripheral vascular disease)     a. 04/2012 ABI: R 0.49, L 0.39. Angiography 06/14: Significant ostial left common iliac artery stenosis extending into the distal aorta, severe left common femoral artery stenosis, bilateral SFA occlusion with heavy calcifications. Functionally, one-vessel runoff bilaterally below the knee  . Chronic diastolic CHF (congestive heart failure)   . Leukocytosis   . Morbid obesity   . NSTEMI (non-ST elevated myocardial infarction) 04/27/2012  .  Dysrhythmia   . Shortness of breath      No Known Allergies   Current Outpatient Prescriptions  Medication Sig Dispense Refill  . albuterol (PROVENTIL HFA;VENTOLIN HFA) 108 (90 BASE) MCG/ACT inhaler Inhale 1-2 puffs into the lungs every 6 (six) hours as needed for wheezing or shortness of breath.      Marland Kitchen amLODipine (NORVASC) 10 MG tablet Take 1 tablet (10 mg total) by mouth daily.  30 tablet  6  . Ascorbic Acid (VITAMIN C) 500 MG CAPS Take by mouth.      Marland Kitchen aspirin EC 81 MG tablet Take 81 mg by mouth 2 (two) times daily.      . Calcium Carbonate-Vit D-Min (CALTRATE 600+D PLUS MINERALS) 600-800 MG-UNIT TABS Take 1 tablet by mouth 2 (two) times daily.      . colesevelam (WELCHOL) 625 MG tablet Take 625 mg by mouth 2 (two) times daily.      . fish oil-omega-3 fatty acids 1000 MG capsule Take 1 g by mouth every morning.      . furosemide (LASIX) 20 MG tablet Take 1 tablet (20 mg total) by mouth daily.  30 tablet  3  . hydroxypropyl methylcellulose (ISOPTO TEARS) 2.5 % ophthalmic solution Place 1 drop into both eyes 3 (three) times daily as needed (for dry eyes).      . Insulin Aspart Prot & Aspart (NOVOLOG 70/30 MIX) (70-30) 100 UNIT/ML Pen Inject 98 Units into the skin 3 (three)  times daily with meals.  90 pen  0  . Insulin Pen Needle (PEN NEEDLES 29GX1/2") 29G X 12MM MISC 1 Container by Does not apply route 3 (three) times daily.  50 each  11  . lisinopril (PRINIVIL,ZESTRIL) 20 MG tablet Take 1 tablet (20 mg total) by mouth 2 (two) times daily.  60 tablet  6  . metoprolol (LOPRESSOR) 50 MG tablet Take 1 tablet (50 mg total) by mouth 2 (two) times daily.  60 tablet  3  . montelukast (SINGULAIR) 10 MG tablet Take 1 tablet (10 mg total) by mouth at bedtime.  30 tablet  3  . Multiple Vitamin (MULTIVITAMIN WITH MINERALS) TABS Take 1 tablet by mouth daily.      . nitroGLYCERIN (NITROSTAT) 0.4 MG SL tablet Place 1 tablet (0.4 mg total) under the tongue every 5 (five) minutes as needed for chest pain  (up to 3 doses).  25 tablet  4  . pravastatin (PRAVACHOL) 80 MG tablet Take 1 tablet (80 mg total) by mouth at bedtime.  30 tablet  3  . Ticagrelor (BRILINTA) 90 MG TABS tablet Take 90 mg by mouth 2 (two) times daily.       No current facility-administered medications for this visit.     Past Surgical History  Procedure Laterality Date  . Tumor removal    . Lad stent       No Known Allergies    No family history on file.   Social History Ms. Gurney reports that she quit smoking about 12 years ago. She quit smokeless tobacco use about 12 years ago. Ms. Assink reports that she does not drink alcohol.   Review of Systems CONSTITUTIONAL: No weight loss, fever, chills, weakness or fatigue.  HEENT: Eyes: No visual loss, blurred vision, double vision or yellow sclerae.No hearing loss, sneezing, congestion, runny nose or sore throat.  SKIN: No rash or itching.  CARDIOVASCULAR: per HPI RESPIRATORY: No shortness of breath, cough or sputum.  GASTROINTESTINAL: No anorexia, nausea, vomiting or diarrhea. No abdominal pain or blood.  GENITOURINARY: No burning on urination, no polyuria NEUROLOGICAL: No headache, dizziness, syncope, paralysis, ataxia, numbness or tingling in the extremities. No change in bowel or bladder control.  MUSCULOSKELETAL: per HPI LYMPHATICS: No enlarged nodes. No history of splenectomy.  PSYCHIATRIC: No history of depression or anxiety.  ENDOCRINOLOGIC: No reports of sweating, cold or heat intolerance. No polyuria or polydipsia.  Marland Kitchen   Physical Examination p 64 bp 128/67 Wt 203 lbs BMI 37 Gen: resting comfortably, no acute distress HEENT: no scleral icterus, pupils equal round and reactive, no palptable cervical adenopathy,  CV: RRR, no m/r/g, no JVD, no carotid bruits Resp: Clear to auscultation bilaterally GI: abdomen is soft, non-tender, non-distended, normal bowel sounds, no hepatosplenomegaly MSK: extremities are warm, no edema.  Skin: warm, no  rash Neuro:  no focal deficits Psych: appropriate affect   Diagnostic Studies  04/2012 Echo: LVEF 55-60%, no WMA, mild LVH, grade I diastolic dysfunction   0000000 ABI: Right 0.57 left 0.48. Moderate left iliac disease, distal right SFA occlusion with reconstitution at the popliteal.   04/2012 Carotid US: bilateral plaque without significant stenosis.   04/2012 Cardiac Cath  PROCEDURAL FINDINGS  Hemodynamics:  AO 155/58 mean 91 mm Hg  LV 163/13 mm Hg  Coronary angiography:  Coronary dominance: right  Left mainstem: Short with shared coronary ostia for the LAD and LCX.  Left anterior descending (LAD): diffuse 30-40% proximal. 90% focal lesion after the first diagonal.  Left  circumflex (LCx): <20% proximal and mid. 80% very small distal OM.  Right coronary artery (RCA): Diffuse 20% proximal and mid vessel disease. Moderate calcification.  Left ventriculography: Left ventricular systolic function is normal, LVEF is estimated at 55-65%, there is no significant mitral regurgitation  PCI Note: Following the diagnostic procedure, the decision was made to proceed with PCI. The radial sheath was upsized to a 6 [place]. Bilinta 180 mg was given orally. Weight-based bivalirudin was given for anticoagulation. Once a therapeutic ACT was achieved, a 6 [place] XB 3.0 guide catheter was inserted. A prowater coronary guidewire was used to cross the lesion. The lesion was predilated with a 2.5 mm balloon. The lesion was then stented with a 3.0x 16 mm Promus premier stent. The stent was postdilated with a 3.25 mm noncompliant balloon. Following PCI, there was 0% residual stenosis and TIMI-3 flow. Final angiography confirmed an excellent result. The patient tolerated the procedure well. There were no immediate procedural complications. A TR band was used for radial hemostasis. The patient was transferred to the post catheterization recovery area for further monitoring.  PCI Data:  Vessel - LAD/Segment - mid   Percent Stenosis (pre) 90%  TIMI-flow 3  Stent 3.0 x 16 mm Promus premier  Percent Stenosis (post) 0%  TIMI-flow (post) 3  Final Conclusions:  1. Single vessel obstructive CAD  2. Normal LV function.  3. Successful stenting of the mid LAD with a DES.    07/2012 Lower Extremity Angiography  Hemodynamics:  Central Aortic Pressure / Mean Aortic Pressure: 157/70  Findings:  Abdominal aorta: Normal in size with no aneurysm. There is mild atherosclerosis below the renal arteries. Distally above the iliac bifurcation there is a calcified plaque at the origin of the left external iliac causing 30% stenosis.  Left renal artery: Normal  Right renal artery: Normal  Celiac artery: Patent  Superior mesenteric artery: Patent  Right common iliac artery: Diffuse 20% disease.  Right internal iliac artery: Normal  Right external iliac artery: Normal  Right common femoral artery: Minor irregularities.  Right profunda femoral artery: Diffuse 20% disease proximally.  Right superficial femoral artery: Diffuse atherosclerosis throughout its course. There is 20% disease at the ostium. This is followed by 70% stenosis proximally. There is another 70% stenosis in the midsegment followed by an occlusion distally Reconstituting in the proximal popliteal artery. This segment is about 50 mm and heavily calcified.  Right popliteal artery: 20-30% disease proximally.  Right tibial peroneal trunk: Normal  Right anterior tibial artery: Diffusely diseased proximally and seems to be occluded in the midsegment.  Right peroneal artery: Patent but diffusely diseased.  Right posterior tibial artery: Patent and the dominant vessel below the knee. It has minor irregularities.  Left common iliac artery: There is a 70-80 % heavily calcified lesion proximally.  Left internal iliac artery: Diffusely diseased.  Left external iliac artery: Minor irregularities.  Left common femoral artery: Heavily calcified with diffuse 90%  disease extending into the ostium of the SFA.  Left profunda femoral artery: Normal.  Left superficial femoral artery: 70% ostial disease with heavy calcifications. Diffuse 40% disease proximally. The vessel is occluded in the midsegment with reconstitution distally via collaterals from the profunda.  Left popliteal artery: Minor irregularities.  Left tibial peroneal trunk: Minor irregularities.  Left anterior tibial artery: Patent. This is the dominant vessel.  Left peroneal artery: Patent but diffusely diseased.  Left posterior tibial artery: Occlude proximally. Conclusions:  1. Significant heavily calcified ostial left common iliac artery stenosis  with plaque extending into the distal aorta.  2. Severe heavily calcified disease in the left common femoral artery.  3. Bilateral SFA occlusion with heavy calcifications.  4. Significant below the knee disease bilaterally as outlined above.  Recommendations:  The patient has diffuse and heavily calcified peripheral arterial disease. Revascularization options are somewhat difficult by endovascular means. There is heavily calcified disease in the left common femoral artery which is not approachable by endovascular options. Endarterectomy is the best option for this. The stenosis in the left common iliac artery can be treated with stenting but will require likely stenting the distal aorta as well. Both SFAs are diffusely diseased, calcified with chronic occlusion. We'll attempt medical therapy and a walking program for now.    11/2012 Labs: 140 K 4.2 Cl 97 Cr 0.75 BUN 9 AST 16 ALT 21 T.bili 0.2 TC 205 TG 169 HDL 70 LDL 101   01/2013 Event Monitor  No symptoms, no arrythmias     Assessment and Plan   1. CAD   -prior NSTEMI 04/2012 with DES to LAD  -  no current symptoms  - will stop brillinta as she has completed a year of therapy  2. PAD  - fairly mixed exertional symptoms, unclear how much is related to PAD vs arthritic like pain. She does  have significant disease from angiogram.  - continuing walking program - per Dr Rica Mast, now that completed with brillinta will start pletal and follow symptoms.   3. Hyperlipidemia  - LDL not at goal (goal <70 based on history of DM and CAD), however on lipitor she had significant myalgias in the past. Tolerating pravastatin - discussed trial of crestor, she would like to hold off on trying at this time and work on dietary and lifestyle changes.   4. Palpitations  - recent monitor with no symptoms, no arrhythmias. - no significant recent symptoms, will continue to follow  5. Chronic diastolic heart failure  - euvolemic in clinic today, no significant symptoms  - continue bp control and current diuretic dosing.   6. HTN  - at goal (<130/80 given her DM), continue current therapy       Arnoldo Lenis, M.D., F.A.C.C.

## 2013-05-24 NOTE — Patient Instructions (Signed)
Your physician recommends that you schedule a follow-up appointment in: 3 months with Dr. Harl Bowie. You should receive a letter in the mail in 1-2 months. If you do not receive this letter by May or June 2015 call our office to schedule this appointment.   Your physician has recommended you make the following change in your medication:  Start: Pletal 50 MG take 1 tablet by mouth twice daily Stop: Brilanta  Continue all other medications the same.

## 2013-05-31 ENCOUNTER — Other Ambulatory Visit: Payer: Self-pay | Admitting: Pharmacist

## 2013-06-01 NOTE — Telephone Encounter (Signed)
Please have patient schedule appointment with Tammy to discuss diabetes

## 2013-06-01 NOTE — Telephone Encounter (Signed)
Mae see Tammy's not on last refill. If you want pt to make appt with pharmacist send to pool to call pt.

## 2013-06-08 ENCOUNTER — Ambulatory Visit: Payer: Self-pay

## 2013-06-23 ENCOUNTER — Telehealth: Payer: Self-pay | Admitting: Cardiology

## 2013-06-23 NOTE — Telephone Encounter (Signed)
Leah Ferrell called stating that the CILOSTAZOL 50 MG PO TABS is not working. Wants to know what needs to be done.

## 2013-06-27 NOTE — Telephone Encounter (Signed)
Patient called stating that Pletal not really working, states she was told to call back if med not helping.  Thinks she needs higher dose. Advised patient that message will be sent to provider for advice.

## 2013-06-28 NOTE — Telephone Encounter (Signed)
She may increase pletal to 100mg  bid. Common side effect is diarrhea, if experiences let us know  Zandra Abts MD

## 2013-06-29 ENCOUNTER — Other Ambulatory Visit: Payer: Self-pay | Admitting: General Practice

## 2013-06-29 MED ORDER — CILOSTAZOL 100 MG PO TABS: 100.0000 mg | ORAL_TABLET | Freq: Two times a day (BID) | ORAL | Status: DC

## 2013-06-29 NOTE — Telephone Encounter (Signed)
Patient notified.  RX sent to [facility].

## 2013-07-01 ENCOUNTER — Other Ambulatory Visit: Payer: Self-pay | Admitting: Pharmacist

## 2013-07-08 ENCOUNTER — Other Ambulatory Visit: Payer: Self-pay

## 2013-07-08 MED ORDER — ALBUTEROL SULFATE HFA 108 (90 BASE) MCG/ACT IN AERS: 1.0000 | INHALATION_SPRAY | Freq: Four times a day (QID) | RESPIRATORY_TRACT | Status: DC | PRN

## 2013-07-12 DIAGNOSIS — E139 Other specified diabetes mellitus without complications: Secondary | ICD-10-CM | POA: Insufficient documentation

## 2013-07-12 DIAGNOSIS — E119 Type 2 diabetes mellitus without complications: Secondary | ICD-10-CM | POA: Insufficient documentation

## 2013-07-12 DIAGNOSIS — Z955 Presence of coronary angioplasty implant and graft: Secondary | ICD-10-CM | POA: Insufficient documentation

## 2013-07-12 DIAGNOSIS — I739 Peripheral vascular disease, unspecified: Secondary | ICD-10-CM | POA: Insufficient documentation

## 2013-07-18 ENCOUNTER — Encounter: Payer: Self-pay | Admitting: Family

## 2013-07-18 ENCOUNTER — Ambulatory Visit (INDEPENDENT_AMBULATORY_CARE_PROVIDER_SITE_OTHER): Payer: 59 | Admitting: Family

## 2013-07-18 VITALS — BP 113/53 | HR 67 | Temp 98.4°F | Ht <= 58 in | Wt 202.0 lb

## 2013-07-18 DIAGNOSIS — E119 Type 2 diabetes mellitus without complications: Secondary | ICD-10-CM

## 2013-07-18 DIAGNOSIS — R9439 Abnormal result of other cardiovascular function study: Secondary | ICD-10-CM | POA: Insufficient documentation

## 2013-07-18 DIAGNOSIS — E785 Hyperlipidemia, unspecified: Secondary | ICD-10-CM

## 2013-07-18 DIAGNOSIS — I10 Essential (primary) hypertension: Secondary | ICD-10-CM

## 2013-07-18 DIAGNOSIS — Z1321 Encounter for screening for nutritional disorder: Secondary | ICD-10-CM

## 2013-07-18 LAB — POCT GLYCOSYLATED HEMOGLOBIN (HGB A1C): HEMOGLOBIN A1C: 7.3

## 2013-07-18 NOTE — Patient Instructions (Signed)
Fat and Cholesterol Control Diet Fat and cholesterol levels in your blood and organs are influenced by your diet. High levels of fat and cholesterol may lead to diseases of the heart, small and large blood vessels, gallbladder, liver, and pancreas. CONTROLLING FAT AND CHOLESTEROL WITH DIET Although exercise and lifestyle factors are important, your diet is key. That is because certain foods are known to raise cholesterol and others to lower it. The goal is to balance foods for their effect on cholesterol and more importantly, to replace saturated and trans fat with other types of fat, such as monounsaturated fat, polyunsaturated fat, and omega-3 fatty acids. On average, a person should consume no more than 15 to 17 g of saturated fat daily. Saturated and trans fats are considered "bad" fats, and they will raise LDL cholesterol. Saturated fats are primarily found in animal products such as meats, butter, and cream. However, that does not mean you need to give up all your favorite foods. Today, there are good tasting, low-fat, low-cholesterol substitutes for most of the things you like to eat. Choose low-fat or nonfat alternatives. Choose round or loin cuts of red meat. These types of cuts are lowest in fat and cholesterol. Chicken (without the skin), fish, veal, and ground turkey breast are great choices. Eliminate fatty meats, such as hot dogs and salami. Even shellfish have little or no saturated fat. Have a 3 oz (85 g) portion when you eat lean meat, poultry, or fish. Trans fats are also called "partially hydrogenated oils." They are oils that have been scientifically manipulated so that they are solid at room temperature resulting in a longer shelf life and improved taste and texture of foods in which they are added. Trans fats are found in stick margarine, some tub margarines, cookies, crackers, and baked goods.  When baking and cooking, oils are a great substitute for butter. The monounsaturated oils are  especially beneficial since it is believed they lower LDL and raise HDL. The oils you should avoid entirely are saturated tropical oils, such as coconut and palm.  Remember to eat a lot from food groups that are naturally free of saturated and trans fat, including fish, fruit, vegetables, beans, grains (barley, rice, couscous, bulgur wheat), and pasta (without cream sauces).  IDENTIFYING FOODS THAT LOWER FAT AND CHOLESTEROL  Soluble fiber may lower your cholesterol. This type of fiber is found in fruits such as apples, vegetables such as broccoli, potatoes, and carrots, legumes such as beans, peas, and lentils, and grains such as barley. Foods fortified with plant sterols (phytosterol) may also lower cholesterol. You should eat at least 2 g per day of these foods for a cholesterol lowering effect.  Read package labels to identify low-saturated fats, trans fat free, and low-fat foods at the supermarket. Select cheeses that have only 2 to 3 g saturated fat per ounce. Use a heart-healthy tub margarine that is free of trans fats or partially hydrogenated oil. When buying baked goods (cookies, crackers), avoid partially hydrogenated oils. Breads and muffins should be made from whole grains (whole-wheat or whole oat flour, instead of "flour" or "enriched flour"). Buy non-creamy canned soups with reduced salt and no added fats.  FOOD PREPARATION TECHNIQUES  Never deep-fry. If you must fry, either stir-fry, which uses very little fat, or use non-stick cooking sprays. When possible, broil, bake, or roast meats, and steam vegetables. Instead of putting butter or margarine on vegetables, use lemon and herbs, applesauce, and cinnamon (for squash and sweet potatoes). Use nonfat   yogurt, salsa, and low-fat dressings for salads.  LOW-SATURATED FAT / LOW-FAT FOOD SUBSTITUTES Meats / Saturated Fat (g)  Avoid: Steak, marbled (3 oz/85 g) / 11 g  Choose: Steak, lean (3 oz/85 g) / 4 g  Avoid: Hamburger (3 oz/85 g) / 7  g  Choose: Hamburger, lean (3 oz/85 g) / 5 g  Avoid: Ham (3 oz/85 g) / 6 g  Choose: Ham, lean cut (3 oz/85 g) / 2.4 g  Avoid: Chicken, with skin, dark meat (3 oz/85 g) / 4 g  Choose: Chicken, skin removed, dark meat (3 oz/85 g) / 2 g  Avoid: Chicken, with skin, light meat (3 oz/85 g) / 2.5 g  Choose: Chicken, skin removed, light meat (3 oz/85 g) / 1 g Dairy / Saturated Fat (g)  Avoid: Whole milk (1 cup) / 5 g  Choose: Low-fat milk, 2% (1 cup) / 3 g  Choose: Low-fat milk, 1% (1 cup) / 1.5 g  Choose: Skim milk (1 cup) / 0.3 g  Avoid: Hard cheese (1 oz/28 g) / 6 g  Choose: Skim milk cheese (1 oz/28 g) / 2 to 3 g  Avoid: Cottage cheese, 4% fat (1 cup) / 6.5 g  Choose: Low-fat cottage cheese, 1% fat (1 cup) / 1.5 g  Avoid: Ice cream (1 cup) / 9 g  Choose: Sherbet (1 cup) / 2.5 g  Choose: Nonfat frozen yogurt (1 cup) / 0.3 g  Choose: Frozen fruit bar / trace  Avoid: Whipped cream (1 tbs) / 3.5 g  Choose: Nondairy whipped topping (1 tbs) / 1 g Condiments / Saturated Fat (g)  Avoid: Mayonnaise (1 tbs) / 2 g  Choose: Low-fat mayonnaise (1 tbs) / 1 g  Avoid: Butter (1 tbs) / 7 g  Choose: Extra light margarine (1 tbs) / 1 g  Avoid: Coconut oil (1 tbs) / 11.8 g  Choose: Olive oil (1 tbs) / 1.8 g  Choose: Corn oil (1 tbs) / 1.7 g  Choose: Safflower oil (1 tbs) / 1.2 g  Choose: Sunflower oil (1 tbs) / 1.4 g  Choose: Soybean oil (1 tbs) / 2.4 g  Choose: Canola oil (1 tbs) / 1 g Document Released: 02/03/2005 Document Revised: 05/31/2012 Document Reviewed: 07/25/2010 [employer] Patient Information 2014 [place], [place]. Cholesterol Cholesterol is a white, waxy, fat-like protein needed by your body in small amounts. The liver makes all the cholesterol you need. It is carried from the liver by the blood through the blood vessels. Deposits (plaque) may build up on blood vessel walls. This makes the arteries narrower and stiffer. Plaque increases the risk for heart  attack and stroke. You cannot feel your cholesterol level even if it is very high. The only way to know is by a blood test to check your lipid (fats) levels. Once you know your cholesterol levels, you should keep a record of the test results. Work with your caregiver to to keep your levels in the desired range. WHAT THE RESULTS MEAN:  Total cholesterol is a rough measure of all the cholesterol in your blood.  LDL is the so-called bad cholesterol. This is the type that deposits cholesterol in the walls of the arteries. You want this level to be low.  HDL is the good cholesterol because it cleans the arteries and carries the LDL away. You want this level to be high.  Triglycerides are fat that the body can either burn for energy or store. High levels are closely linked to heart disease. DESIRED  LEVELS:  Total cholesterol below 200.  LDL below 100 for people at risk, below 70 for very high risk.  HDL above 50 is good, above 60 is best.  Triglycerides below 150. HOW TO LOWER YOUR CHOLESTEROL:  Diet.  Choose fish or white meat chicken and [place], roasted or baked. Limit fatty cuts of red meat, fried foods, and processed meats, such as sausage and lunch meat.  Eat lots of fresh fruits and vegetables. Choose whole grains, beans, pasta, potatoes and cereals.  Use only small amounts of olive, corn or canola oils. Avoid butter, mayonnaise, shortening or palm kernel oils. Avoid foods with trans-fats.  Use skim/nonfat milk and low-fat/nonfat yogurt and cheeses. Avoid whole milk, cream, ice cream, egg yolks and cheeses. Healthy desserts include angel food cake, ginger snaps, animal crackers, hard candy, popsicles, and low-fat/nonfat frozen yogurt. Avoid pastries, cakes, pies and cookies.  Exercise.  A regular program helps decrease LDL and raises HDL.  Helps with weight control.  Do things that increase your activity level like gardening, walking, or taking the stairs.  Medication.  May  be prescribed by your caregiver to help lowering cholesterol and the risk for heart disease.  You may need medicine even if your levels are normal if you have several risk factors. HOME CARE INSTRUCTIONS   Follow your diet and exercise programs as suggested by your caregiver.  Take medications as directed.  Have blood work done when your caregiver feels it is necessary. MAKE SURE YOU:   Understand these instructions.  Will watch your condition.  Will get help right away if you are not doing well or get worse. Document Released: 10/29/2000 Document Revised: 04/28/2011 Document Reviewed: 11/17/2012 [facility] Patient Information 2014 [place], [place].

## 2013-07-18 NOTE — Progress Notes (Signed)
Subjective:    Patient ID: Leah Ferrell, female    DOB: [birth year], 63 y.o.   MRN: [id number]  Diabetes She presents for her follow-up diabetic visit. She has type 2 diabetes mellitus. Her disease course has been stable. There are no hypoglycemic associated symptoms. Pertinent negatives for hypoglycemia include no headaches. Associated symptoms include fatigue and foot paresthesias. Pertinent negatives for diabetes include no blurred vision, no foot ulcerations and no visual change. There are no hypoglycemic complications. Risk factors for coronary artery disease include dyslipidemia, diabetes mellitus, hypertension, obesity and post-menopausal. Current diabetic treatment includes insulin injections. She is compliant with treatment all of the time. Her weight is stable. She is following a generally healthy diet. An ACE inhibitor/angiotensin II receptor blocker is being taken. Eye exam is current.  Hyperlipidemia This is a chronic problem. The current episode started more than 1 year ago. The problem is uncontrolled. Recent lipid tests were reviewed and are high. Exacerbating diseases include diabetes and obesity. She has no history of hypothyroidism. Factors aggravating her hyperlipidemia include beta blockers. Associated symptoms include leg pain. Current antihyperlipidemic treatment includes statins. The current treatment provides moderate improvement of lipids. Compliance problems include adherence to diet and medication cost.  Risk factors for coronary artery disease include diabetes mellitus, dyslipidemia, family history, hypertension, obesity and post-menopausal.  Hypertension This is a chronic problem. The current episode started more than 1 year ago. The problem has been resolved since onset. The problem is controlled. Associated symptoms include palpitations. Pertinent negatives include no blurred vision, headaches or peripheral edema. Risk factors for coronary artery disease include diabetes  mellitus, dyslipidemia, family history, post-menopausal state and obesity. Past treatments include beta blockers, calcium channel blockers and ACE inhibitors. The current treatment provides moderate improvement. Hypertensive end-organ damage includes CAD/MI. There is no history of heart failure or a thyroid problem.    *Pt wen to ED last week for palpitations. Pt had a stress test done and has a cardiologists appointment. Pt has hx of "two mild heart attacks last year". Pt had stents placed during that time.  Review of Systems  Constitutional: Positive for fatigue.  Eyes: Negative for blurred vision.  Respiratory: Negative.   Cardiovascular: Positive for palpitations.  Genitourinary: Negative.   Musculoskeletal: Positive for back pain, gait problem and joint swelling.  Neurological: Negative for headaches.  Hematological: Negative.   Psychiatric/Behavioral: Negative.   All other systems reviewed and are negative.      Objective:   Physical Exam  Vitals reviewed. Constitutional: She is oriented to person, place, and time. She appears well-developed and well-nourished. No distress.  HENT:  Head: Normocephalic and atraumatic.  Right Ear: External ear normal.  Mouth/Throat: Oropharynx is clear and moist.  Eyes: Pupils are equal, round, and reactive to light.  Cardiovascular: Normal rate, regular rhythm, normal heart sounds and intact distal pulses.   No murmur heard. Pulmonary/Chest: Effort normal and breath sounds normal. No respiratory distress. She has no wheezes.  Abdominal: Soft. Bowel sounds are normal. She exhibits no distension. There is no tenderness.  Musculoskeletal: Normal range of motion. She exhibits no edema and no tenderness.  Neurological: She is alert and oriented to person, place, and time. She has normal reflexes. No cranial nerve deficit.  Skin: Skin is warm and dry.  Psychiatric: She has a normal mood and affect. Her behavior is normal. Judgment and thought  content normal.      BP 113/53  Pulse 67  Temp(Src) 98.4 F (36.9 C) (Oral)  Ht  3' (0.914 m)  Wt 202 lb (91.627 kg)  BMI 109.68 kg/m2     Assessment & Plan:  1. Hypertension - CMP14+EGFR  2. Diabetes mellitus without complication - POCT glycosylated hemoglobin (Hb A1C)  3. Hyperlipidemia -Pt has a hx of high cholesterol but can not tolerate liptor - Lipid panel  4. Encounter for vitamin deficiency screening - Vit D  25 hydroxy (rtn osteoporosis monitoring)   Continue all meds Labs pending KEEP all appointments with Cardiologist Health Maintenance reviewed hemoccult cards given to patient with directions Diet and exercise encouraged RTO 3 month   Evelina Dun, FNP

## 2013-07-19 LAB — CMP14+EGFR
A/G RATIO: 1.2 (ref 1.1–2.5)
ALT: 24 IU/L (ref 0–32)
AST: 16 IU/L (ref 0–40)
Albumin: 4.3 g/dL (ref 3.6–4.8)
Alkaline Phosphatase: 67 IU/L (ref 39–117)
BUN/Creatinine Ratio: 15 (ref 11–26)
BUN: 12 mg/dL (ref 8–27)
CO2: 27 mmol/L (ref 18–29)
CREATININE: 0.79 mg/dL (ref 0.57–1.00)
Calcium: 9.9 mg/dL (ref 8.7–10.3)
Chloride: 94 mmol/L — ABNORMAL LOW (ref 97–108)
GFR calc non Af Amer: 80 mL/min/{1.73_m2} (ref >59)
GFR, EST AFRICAN AMERICAN: 93 mL/min/{1.73_m2} (ref >59)
GLOBULIN, TOTAL: 3.5 g/dL (ref 1.5–4.5)
GLUCOSE: 91 mg/dL (ref 65–99)
POTASSIUM: 4.7 mmol/L (ref 3.5–5.2)
Sodium: 137 mmol/L (ref 134–144)
TOTAL PROTEIN: 7.8 g/dL (ref 6.0–8.5)
Total Bilirubin: 0.2 mg/dL (ref 0.0–1.2)

## 2013-07-19 LAB — LIPID PANEL
Chol/HDL Ratio: 2.6 ratio units (ref 0.0–4.4)
Cholesterol, Total: 191 mg/dL (ref 100–199)
HDL: 73 mg/dL (ref >39)
LDL Calculated: 92 mg/dL (ref 0–99)
Triglycerides: 129 mg/dL (ref 0–149)
VLDL CHOLESTEROL CAL: 26 mg/dL (ref 5–40)

## 2013-07-19 LAB — VITAMIN D 25 HYDROXY (VIT D DEFICIENCY, FRACTURES): Vit D, 25-Hydroxy: 29.4 ng/mL — ABNORMAL LOW (ref 30.0–100.0)

## 2013-08-04 ENCOUNTER — Other Ambulatory Visit: Payer: Self-pay | Admitting: Nurse Practitioner

## 2013-08-10 ENCOUNTER — Telehealth: Payer: Self-pay | Admitting: Cardiology

## 2013-08-10 ENCOUNTER — Telehealth: Payer: Self-pay | Admitting: Cardiovascular Disease

## 2013-08-10 NOTE — Telephone Encounter (Signed)
I scheduled appt for patient to see Dr Fletcher [name] July 7th @ 11am and confirmed with patient.   She also wanted to schedule with Dr Harl Bowie and I scheduled her to see him June 30th

## 2013-08-10 NOTE — Telephone Encounter (Signed)
Patient called back and wants to stay with [facility] but unsure what to do with her surgery.

## 2013-08-10 NOTE — Telephone Encounter (Signed)
I spoke with her. She is going to cancel her [facility] appointments.  Please schedule her to see me on July 7th in [facility] office.

## 2013-08-10 NOTE — Telephone Encounter (Signed)
Opened in error

## 2013-08-10 NOTE — Telephone Encounter (Signed)
Patient was told by [facility] that we were no longer in practice and advised her to establish with [facility].   She was in [facility] and had to have two stents placed after being transferred to [facility]. She has established with them and is currently going under another surgery on her arteries in her leg next week.  She is not happy that she was misinformed by [facility] about our practice and wants to return to our practice once she has her surgery.

## 2013-08-10 NOTE — Telephone Encounter (Signed)
Patient called back -stated that she has been thinking about this and she would like to speak with Dr Fletcher [name] or his nurse about her up coming surgery that [facility] has told her she needed on her arteries in her leg

## 2013-08-12 ENCOUNTER — Telehealth: Payer: Self-pay | Admitting: *Deleted

## 2013-08-12 NOTE — Telephone Encounter (Addendum)
Patient c/o numbness in left arm and said her entire left side feels chilled. Patient said that she had this feeling before and ended up having to get stents placed in her heart. No chest pain, dizziness. Patient said she has taken her aspirin already. Patient advised that she needed to go to the ED for an evaluation.

## 2013-08-15 NOTE — Telephone Encounter (Signed)
Agree, with those symptoms she needs ER evaluation   Zandra Abts MD

## 2013-08-16 ENCOUNTER — Ambulatory Visit (INDEPENDENT_AMBULATORY_CARE_PROVIDER_SITE_OTHER): Payer: 59 | Admitting: Cardiology

## 2013-08-16 ENCOUNTER — Encounter: Payer: Self-pay | Admitting: Cardiology

## 2013-08-16 VITALS — BP 100/63 | HR 80 | Ht 62.0 in | Wt 203.0 lb

## 2013-08-16 DIAGNOSIS — E785 Hyperlipidemia, unspecified: Secondary | ICD-10-CM

## 2013-08-16 DIAGNOSIS — I2510 Atherosclerotic heart disease of native coronary artery without angina pectoris: Secondary | ICD-10-CM

## 2013-08-16 DIAGNOSIS — R079 Chest pain, unspecified: Secondary | ICD-10-CM

## 2013-08-16 DIAGNOSIS — I5032 Chronic diastolic (congestive) heart failure: Secondary | ICD-10-CM

## 2013-08-16 NOTE — Patient Instructions (Signed)
Continue all current medications. Dr. Idamae Lusher info provided today.  Follow up in  3 months

## 2013-08-16 NOTE — Progress Notes (Signed)
Clinical Summary Leah Ferrell is a 63 y.o.female seen today for follow up of the following medical problems.   1. CAD  - prior NSTEMI 04/2012 with DES to LAD  0 07/21/2013 cath at [facility] after abnormal nuclear stress test. Succesfull pci to LAD with DES. - denies any recent symptoms   2. PAD  - previous angiography 07/2012 by Dr Fletcher [name] with significant disease as descirbed below, per notes goal for intensive medical therapy including walking program and starting pletal after DAPT completed for her NSTEMI  - has been walking daily  - fairly mixed symptoms in her left leg with exertion, describes diffuse pain throughout entire leg including joints, does not occur at rest.   3. Chronic diastolic heart failure  - denis any SOB, DOE, orthopnea, or PND  - compliant with diuretic, limits sodium intake. Occasionally takes advil but rarely.   4. HTN  - does not check regularly  - compliant with meds   5. Hyperlipidemia  - compliant with statin  - 03/2013 TC 220 TG 193 HDL 49 LDL 106  - does not want stronger statin, prior side effects on lipitor   6. Palpitations  - denies any significant recent palpitations.  - last visit setup a 14 day event monitor which showed no symptoms reported, no arrythmias  Past Medical History  Diagnosis Date  . Hypertension   . Diabetes mellitus without complication   . Hyperlipidemia   . CAD (coronary artery disease)     a. 04/2012 NSTEMI: s/p DES to LAD.  Marland Kitchen PVD (peripheral vascular disease)     a. 04/2012 ABI: R 0.49, L 0.39. Angiography 06/14: Significant ostial left common iliac artery stenosis extending into the distal aorta, severe left common femoral artery stenosis, bilateral SFA occlusion with heavy calcifications. Functionally, one-vessel runoff bilaterally below the knee  . Chronic diastolic CHF (congestive heart failure)   . Leukocytosis   . Morbid obesity   . NSTEMI (non-ST elevated myocardial infarction) 04/27/2012  . Dysrhythmia    . Shortness of breath      No Known Allergies   Current Outpatient Prescriptions  Medication Sig Dispense Refill  . albuterol (PROVENTIL HFA;VENTOLIN HFA) 108 (90 BASE) MCG/ACT inhaler Inhale 1-2 puffs into the lungs every 6 (six) hours as needed for wheezing or shortness of breath.  9 Inhaler  0  . amLODipine (NORVASC) 10 MG tablet Take 1 tablet (10 mg total) by mouth daily.  30 tablet  6  . Ascorbic Acid (VITAMIN C) 500 MG CAPS Take by mouth.      Marland Kitchen aspirin EC 81 MG tablet Take 81 mg by mouth 2 (two) times daily.      . Calcium Carbonate-Vit D-Min (CALTRATE 600+D PLUS MINERALS) 600-800 MG-UNIT TABS Take 1 tablet by mouth 2 (two) times daily.      . cilostazol (PLETAL) 100 MG tablet Take 1 tablet (100 mg total) by mouth 2 (two) times daily.  60 tablet  6  . colesevelam (WELCHOL) 625 MG tablet Take 625 mg by mouth 2 (two) times daily.      . fish oil-omega-3 fatty acids 1000 MG capsule Take 1 g by mouth every morning.      . furosemide (LASIX) 20 MG tablet Take 40 mg by mouth daily.      . hydroxypropyl methylcellulose (ISOPTO TEARS) 2.5 % ophthalmic solution Place 1 drop into both eyes 3 (three) times daily as needed (for dry eyes).      . Insulin  Pen Needle (PEN NEEDLES 29GX1/2") 29G X 12MM MISC 1 Container by Does not apply route 3 (three) times daily.  50 each  11  . lisinopril (PRINIVIL,ZESTRIL) 20 MG tablet Take 1 tablet (20 mg total) by mouth 2 (two) times daily.  60 tablet  6  . metoprolol (LOPRESSOR) 50 MG tablet TAKE ONE TABLET BY MOUTH TWICE DAILY  60 tablet  2  . montelukast (SINGULAIR) 10 MG tablet Take 1 tablet (10 mg total) by mouth at bedtime.  30 tablet  3  . Multiple Vitamin (MULTIVITAMIN WITH MINERALS) TABS Take 1 tablet by mouth daily.      . nitroGLYCERIN (NITROSTAT) 0.4 MG SL tablet Place 1 tablet (0.4 mg total) under the tongue every 5 (five) minutes as needed for chest pain (up to 3 doses).  25 tablet  4  . NOVOLOG MIX 70/30 FLEXPEN (70-30) 100 UNIT/ML Pen INJECT 98  UNITS SUBCUTANEOUSLY THREE TIMES DAILY  90 mL  2  . pravastatin (PRAVACHOL) 80 MG tablet Take 1 tablet (80 mg total) by mouth at bedtime.  30 tablet  3   No current facility-administered medications for this visit.     Past Surgical History  Procedure Laterality Date  . Tumor removal    . Lad stent       No Known Allergies    No family history on file.   Social History Ms. Chagnon reports that she quit smoking about 12 years ago. She quit smokeless tobacco use about 12 years ago. Ms. Brodrick reports that she does not drink alcohol.   Review of Systems CONSTITUTIONAL: No weight loss, fever, chills, weakness or fatigue.  HEENT: Eyes: No visual loss, blurred vision, double vision or yellow sclerae.No hearing loss, sneezing, congestion, runny nose or sore throat.  SKIN: No rash or itching.  CARDIOVASCULAR: per HPI RESPIRATORY: No shortness of breath, cough or sputum.  GASTROINTESTINAL: No anorexia, nausea, vomiting or diarrhea. No abdominal pain or blood.  GENITOURINARY: No burning on urination, no polyuria NEUROLOGICAL: No headache, dizziness, syncope, paralysis, ataxia, numbness or tingling in the extremities. No change in bowel or bladder control.  MUSCULOSKELETAL: No muscle, back pain, joint pain or stiffness.  LYMPHATICS: No enlarged nodes. No history of splenectomy.  PSYCHIATRIC: No history of depression or anxiety.  ENDOCRINOLOGIC: No reports of sweating, cold or heat intolerance. No polyuria or polydipsia.  Marland Kitchen   Physical Examination p 80 bp 100/63 Wt 203 lbs BMI 37 Gen: resting comfortably, no acute distress HEENT: no scleral icterus, pupils equal round and reactive, no palptable cervical adenopathy,  CV: RRR, no m/r/g, no JVD Resp: Clear to auscultation bilaterally GI: abdomen is soft, non-tender, non-distended, normal bowel sounds, no hepatosplenomegaly MSK: extremities are warm, no edema.  Skin: warm, no rash Neuro:  no focal deficits Psych: appropriate  affect   Diagnostic Studies  04/2012 Echo: LVEF 55-60%, no WMA, mild LVH, grade I diastolic dysfunction   02/8839 ABI: Right 0.57 left 0.48. Moderate left iliac disease, distal right SFA occlusion with reconstitution at the popliteal.   04/2012 Carotid US: bilateral plaque without significant stenosis.   04/2012 Cardiac Cath  PROCEDURAL FINDINGS  Hemodynamics:  AO 155/58 mean 91 mm Hg  LV 163/13 mm Hg  Coronary angiography:  Coronary dominance: right  Left mainstem: Short with shared coronary ostia for the LAD and LCX.  Left anterior descending (LAD): diffuse 30-40% proximal. 90% focal lesion after the first diagonal.  Left circumflex (LCx): <20% proximal and mid. 80% very small distal OM.  Right coronary artery (RCA): Diffuse 20% proximal and mid vessel disease. Moderate calcification.  Left ventriculography: Left ventricular systolic function is normal, LVEF is estimated at 55-65%, there is no significant mitral regurgitation  PCI Note: Following the diagnostic procedure, the decision was made to proceed with PCI. The radial sheath was upsized to a 6 [place]. Bilinta 180 mg was given orally. Weight-based bivalirudin was given for anticoagulation. Once a therapeutic ACT was achieved, a 6 [place] XB 3.0 guide catheter was inserted. A prowater coronary guidewire was used to cross the lesion. The lesion was predilated with a 2.5 mm balloon. The lesion was then stented with a 3.0x 16 mm Promus premier stent. The stent was postdilated with a 3.25 mm noncompliant balloon. Following PCI, there was 0% residual stenosis and TIMI-3 flow. Final angiography confirmed an excellent result. The patient tolerated the procedure well. There were no immediate procedural complications. A TR band was used for radial hemostasis. The patient was transferred to the post catheterization recovery area for further monitoring.  PCI Data:  Vessel - LAD/Segment - mid  Percent Stenosis (pre) 90%  TIMI-flow 3  Stent 3.0 x 16  mm Promus premier  Percent Stenosis (post) 0%  TIMI-flow (post) 3  Final Conclusions:  1. Single vessel obstructive CAD  2. Normal LV function.  3. Successful stenting of the mid LAD with a DES.    07/2012 Lower Extremity Angiography  Hemodynamics:  Central Aortic Pressure / Mean Aortic Pressure: 157/70  Findings:  Abdominal aorta: Normal in size with no aneurysm. There is mild atherosclerosis below the renal arteries. Distally above the iliac bifurcation there is a calcified plaque at the origin of the left external iliac causing 30% stenosis.  Left renal artery: Normal  Right renal artery: Normal  Celiac artery: Patent  Superior mesenteric artery: Patent  Right common iliac artery: Diffuse 20% disease.  Right internal iliac artery: Normal  Right external iliac artery: Normal  Right common femoral artery: Minor irregularities.  Right profunda femoral artery: Diffuse 20% disease proximally.  Right superficial femoral artery: Diffuse atherosclerosis throughout its course. There is 20% disease at the ostium. This is followed by 70% stenosis proximally. There is another 70% stenosis in the midsegment followed by an occlusion distally Reconstituting in the proximal popliteal artery. This segment is about 50 mm and heavily calcified.  Right popliteal artery: 20-30% disease proximally.  Right tibial peroneal trunk: Normal  Right anterior tibial artery: Diffusely diseased proximally and seems to be occluded in the midsegment.  Right peroneal artery: Patent but diffusely diseased.  Right posterior tibial artery: Patent and the dominant vessel below the knee. It has minor irregularities.  Left common iliac artery: There is a 70-80 % heavily calcified lesion proximally.  Left internal iliac artery: Diffusely diseased.  Left external iliac artery: Minor irregularities.  Left common femoral artery: Heavily calcified with diffuse 90% disease extending into the ostium of the SFA.  Left profunda  femoral artery: Normal.  Left superficial femoral artery: 70% ostial disease with heavy calcifications. Diffuse 40% disease proximally. The vessel is occluded in the midsegment with reconstitution distally via collaterals from the profunda.  Left popliteal artery: Minor irregularities.  Left tibial peroneal trunk: Minor irregularities.  Left anterior tibial artery: Patent. This is the dominant vessel.  Left peroneal artery: Patent but diffusely diseased.  Left posterior tibial artery: Occlude proximally. Conclusions:  1. Significant heavily calcified ostial left common iliac artery stenosis with plaque extending into the distal aorta.  2. Severe heavily calcified  disease in the left common femoral artery.  3. Bilateral SFA occlusion with heavy calcifications.  4. Significant below the knee disease bilaterally as outlined above.  Recommendations:  The patient has diffuse and heavily calcified peripheral arterial disease. Revascularization options are somewhat difficult by endovascular means. There is heavily calcified disease in the left common femoral artery which is not approachable by endovascular options. Endarterectomy is the best option for this. The stenosis in the left common iliac artery can be treated with stenting but will require likely stenting the distal aorta as well. Both SFAs are diffusely diseased, calcified with chronic occlusion. We'll attempt medical therapy and a walking program for now.    11/2012 Labs: 140 K 4.2 Cl 97 Cr 0.75 BUN 9 AST 16 ALT 21 T.bili 0.2 TC 205 TG 169 HDL 70 LDL 101   01/2013 Event Monitor  No symptoms, no arrythmias   06/2013 Lexiscan MPI Large reversible defect in the basal anterior, basal anteroseptal, mid anterior, mid anteroseptal, apical anteior, apical segments mild to moderate.   07/21/13 Cath Hemodynamic Measurements Pressures:  - AO 130 / 56. - LV systolic 272 LV End-diastolic 13.  Angiographic Findings: Left main: Normal Left anterior  descending: There is 70% stenosis at the proximal segment, first diagonal is intermediate in size, there is a stent in mid LAD which is patent. Left circumflex: Angiographically normal Right coronary artery: There is 30% narrowing in the mid segment. Right dominant  Left ventriculogram: Not done.   INTERVENTIONAL PROCEDURE NARRATIVE:  Intra-operative Anticoagulation: Additional dose of 2500 heparin IV  Equipment - Guide Catheters: 6 [place] EBU 3 - Guide Wires: Run-through  Procedure Details: I advanced a 3.0 x 12 balloon and predilated the lesion up to 14 atmospheres for 20 seconds and then I repeated that for another 14 atmospheres for 20 seconds. Then I advanced a Promus 3.5 x 16 drug-eluting stent and deployed it at the ostium and  proximal LAD with extreme care not to involve the ostium of left circumflex. I deployed the stent at 16 atmospheres, for 45 seconds, enlarging the stent to 3.7 mm. Post deployment angiography showed excellent results no complications. I gave the patient  Effient 60 mg, removed the catheter and used a TR band to obtain hemostasis.Marland Kitchen  Post-operative Anticoagulation: Aspirin and Effient  Complications: None  IMPRESSION: 1. Successful PCI to proximal LAD with a Promus 3.5 x 16 drug-eluting stent, postdilated to 3.7 mm. 2. Patent mid LAD stent. 3. Normal left ventricular end-diastolic pressure of 13 mm Hg. 4. Coronary angiography and percutaneous coronary intervention performed with access through the right radial artery.  PLAN: 1. Aspirin 81 mg and Effient 10 mg daily for at least one year 2. Continue medical management of coronary artery disease. 3. Due to the fact that patient has significant life style limiting Rutherford class III claudication, I will proceed with PVR and duplex study. 4. Followup with Dr. Hamilton Capri.            Assessment and Plan   1. CAD  -prior NSTEMI 04/2012 with DES to LAD  - 07/2013 abnormal nuclear stress, f/u cath  received DES to LAD at [facility] - no current symptoms  - continue current meds  2. PAD  - continue to follow with Dr Fletcher [name]  3. Hyperlipidemia  - LDL not at goal (goal <70 based on history of DM and CAD), however on lipitor she had significant myalgias in the past. Tolerating pravastatin  - discussed trial of crestor, she would like  to hold off on trying at this time and work on dietary and lifestyle changes.   4. Palpitations  - recent monitor with no symptoms, no arrhythmias.  - no significant recent symptoms, will continue to follow   5. Chronic diastolic heart failure  - euvolemic in clinic today, no significant symptoms  - continue bp control and current diuretic dosing.   6. HTN  - at goal (<130/80 given her DM), continue current therapy       Arnoldo Lenis, M.D., F.A.C.C.

## 2013-08-23 ENCOUNTER — Ambulatory Visit (INDEPENDENT_AMBULATORY_CARE_PROVIDER_SITE_OTHER): Payer: 59 | Admitting: Cardiovascular Disease

## 2013-08-23 ENCOUNTER — Encounter: Payer: Self-pay | Admitting: Cardiovascular Disease

## 2013-08-23 ENCOUNTER — Encounter: Payer: Self-pay | Admitting: *Deleted

## 2013-08-23 VITALS — BP 150/68 | HR 77 | Ht 62.0 in | Wt 201.8 lb

## 2013-08-23 DIAGNOSIS — I739 Peripheral vascular disease, unspecified: Secondary | ICD-10-CM

## 2013-08-23 DIAGNOSIS — I25119 Atherosclerotic heart disease of native coronary artery with unspecified angina pectoris: Secondary | ICD-10-CM

## 2013-08-23 DIAGNOSIS — I2510 Atherosclerotic heart disease of native coronary artery without angina pectoris: Secondary | ICD-10-CM

## 2013-08-23 DIAGNOSIS — I209 Angina pectoris, unspecified: Secondary | ICD-10-CM

## 2013-08-23 DIAGNOSIS — E785 Hyperlipidemia, unspecified: Secondary | ICD-10-CM

## 2013-08-23 LAB — BASIC METABOLIC PANEL
BUN: 18 mg/dL (ref 6–23)
CO2: 31 meq/L (ref 19–32)
CREATININE: 0.7 mg/dL (ref 0.4–1.2)
Calcium: 9.7 mg/dL (ref 8.4–10.5)
Chloride: 96 mEq/L (ref 96–112)
GFR: 85.76 mL/min (ref >60.00)
Glucose, Bld: 201 mg/dL — ABNORMAL HIGH (ref 70–99)
POTASSIUM: 3.9 meq/L (ref 3.5–5.1)
Sodium: 134 mEq/L — ABNORMAL LOW (ref 135–145)

## 2013-08-23 LAB — CBC WITH DIFFERENTIAL/PLATELET
BASOS PCT: 0.4 % (ref 0.0–3.0)
Basophils Absolute: 0.1 10*3/uL (ref 0.0–0.1)
Eosinophils Absolute: 0.5 10*3/uL (ref 0.0–0.7)
Eosinophils Relative: 3.6 % (ref 0.0–5.0)
HEMATOCRIT: 39.8 % (ref 36.0–46.0)
Hemoglobin: 13.2 g/dL (ref 12.0–15.0)
Lymphocytes Relative: 25.4 % (ref 12.0–46.0)
Lymphs Abs: 3.4 10*3/uL (ref 0.7–4.0)
MCHC: 33.1 g/dL (ref 30.0–36.0)
MCV: 93.1 fl (ref 78.0–100.0)
MONO ABS: 0.7 10*3/uL (ref 0.1–1.0)
MONOS PCT: 5.3 % (ref 3.0–12.0)
Neutro Abs: 8.8 10*3/uL — ABNORMAL HIGH (ref 1.4–7.7)
Neutrophils Relative %: 65.3 % (ref 43.0–77.0)
Platelets: 322 10*3/uL (ref 150.0–400.0)
RBC: 4.28 Mil/uL (ref 3.87–5.11)
RDW: 15.1 % (ref 11.5–15.5)
WBC: 13.5 10*3/uL — AB (ref 4.0–10.5)

## 2013-08-23 LAB — PROTIME-INR
INR: 1 ratio (ref 0.8–1.0)
PROTHROMBIN TIME: 10.6 s (ref 9.6–13.1)

## 2013-08-23 NOTE — Patient Instructions (Signed)
Your physician has requested that you have a  ABDOMINAL AORTA GRAFT LEFT LOWER EXTREMITY ANGIOGRAPHY WITH POSSIBLE ANGIOPLASTY. This exam is performed at the hospital.Please review the information sheet given for details.  Your physician recommends that you have lab work TODAY: CBC, BMET, INR  Your physician recommends that you continue on your current medications as directed. Please refer to the Current Medication list given to you today.

## 2013-08-23 NOTE — Progress Notes (Signed)
HPI  This is a pleasant 63 year old female who is here today for a followup visit regarding peripheral arterial disease.  She has known history of coronary artery disease status post drug-eluting stent placement to the LAD. She had recurrent chest pain recently and was transferred from [place] to [facility]. She underwent cardiac catheterization which showed severe in-stent restenosis. She had another drug-eluting stent placement. She has known history of peripheral arterial disease. Workup last year revealed an ABI of 0. 49 right; 0.39 left. I performed lower extremity angiography in June of last year which showed significant calcified ostial left common iliac artery stenosis extending into the distal aorta, severe left common femoral artery disease extending into ostial SFA and profunda, bilateral SFA occlusions with diffusely diseased segments proximally and heavy calcifications. There was significant disease below the knee bilaterally with functionally one-vessel runoff. At that time, she had moderate claudication worse on the right side. Thus, no intervention was performed. However, over the few months, she reports significant progression of symptoms on the left side to the point of happening after only a few steps and occasionally at rest. No lower extremity ulceration.  Allergies  Allergen Reactions  . Tape Rash     Current Outpatient Prescriptions on File Prior to Visit  Medication Sig Dispense Refill  . albuterol (PROVENTIL HFA;VENTOLIN HFA) 108 (90 BASE) MCG/ACT inhaler Inhale 1-2 puffs into the lungs every 6 (six) hours as needed for wheezing or shortness of breath.  9 Inhaler  0  . amLODipine (NORVASC) 10 MG tablet Take 1 tablet (10 mg total) by mouth daily.  30 tablet  6  . Ascorbic Acid (VITAMIN C) 500 MG CAPS Take 1 capsule by mouth daily.       Marland Kitchen aspirin EC 81 MG tablet Take 81 mg by mouth 2 (two) times daily.      . Calcium Carbonate-Vit D-Min (CALTRATE  600+D PLUS MINERALS) 600-800 MG-UNIT TABS Take 1 tablet by mouth 2 (two) times daily.      . colesevelam (WELCHOL) 625 MG tablet Take 625 mg by mouth 2 (two) times daily.      . fish oil-omega-3 fatty acids 1000 MG capsule Take 1 g by mouth every morning.      . hydroxypropyl methylcellulose (ISOPTO TEARS) 2.5 % ophthalmic solution Place 1 drop into both eyes 3 (three) times daily as needed (for dry eyes).      . Insulin Pen Needle (PEN NEEDLES 29GX1/2") 29G X 12MM MISC 1 Container by Does not apply route 3 (three) times daily.  50 each  11  . lisinopril (PRINIVIL,ZESTRIL) 20 MG tablet Take 1 tablet (20 mg total) by mouth 2 (two) times daily.  60 tablet  6  . metoprolol succinate (TOPROL-XL) 25 MG 24 hr tablet Take 75 mg by mouth 2 (two) times daily.      . montelukast (SINGULAIR) 10 MG tablet Take 1 tablet (10 mg total) by mouth at bedtime.  30 tablet  3  . Multiple Vitamin (MULTIVITAMIN WITH MINERALS) TABS Take 1 tablet by mouth daily.      . nitroGLYCERIN (NITROSTAT) 0.4 MG SL tablet Place 1 tablet (0.4 mg total) under the tongue every 5 (five) minutes as needed for chest pain (up to 3 doses).  25 tablet  4  . NOVOLOG MIX 70/30 FLEXPEN (70-30) 100 UNIT/ML Pen INJECT 98 UNITS SUBCUTANEOUSLY THREE TIMES DAILY  90 mL  2  . potassium chloride SA (K-DUR,KLOR-CON) 20 MEQ tablet Take 20 mEq  by mouth daily.      . prasugrel (EFFIENT) 10 MG TABS tablet Take 10 mg by mouth daily.      . pravastatin (PRAVACHOL) 80 MG tablet Take 1 tablet (80 mg total) by mouth at bedtime.  30 tablet  3   No current facility-administered medications on file prior to visit.     Past Medical History  Diagnosis Date  . Hypertension   . Diabetes mellitus without complication   . Hyperlipidemia   . CAD (coronary artery disease)     a. 04/2012 NSTEMI: s/p DES to LAD.  Marland Kitchen PVD (peripheral vascular disease)     a. 04/2012 ABI: R 0.49, L 0.39. Angiography 06/14: Significant ostial left common iliac artery stenosis extending  into the distal aorta, severe left common femoral artery stenosis, bilateral SFA occlusion with heavy calcifications. Functionally, one-vessel runoff bilaterally below the knee  . Chronic diastolic CHF (congestive heart failure)   . Leukocytosis   . Morbid obesity   . NSTEMI (non-ST elevated myocardial infarction) 04/27/2012  . Dysrhythmia   . Shortness of breath      Past Surgical History  Procedure Laterality Date  . Tumor removal    . Lad stent       No family history on file.   History   Social History  . Marital Status: Married    Spouse Name: N/A    Number of Children: N/A  . Years of Education: N/A   Occupational History  . Not on file.   Social History Main Topics  . Smoking status: Former Smoker    Quit date: 02/17/2001  . Smokeless tobacco: Former Systems developer    Quit date: 04/20/2001  . Alcohol Use: No  . Drug Use: No  . Sexual Activity: Not on file   Other Topics Concern  . Not on file   Social History Narrative  . No narrative on file      PHYSICAL EXAM   BP 150/68  Pulse 77  Ht 5\' 2"  (1.575 m)  Wt 201 lb 12.8 oz (91.536 kg)  BMI 36.90 kg/m2 Constitutional: She is oriented to person, place, and time. She appears well-developed and well-nourished. No distress.  HENT: No nasal discharge.  Head: Normocephalic and atraumatic.  Eyes: Pupils are equal and round. Right eye exhibits no discharge. Left eye exhibits no discharge.  Neck: Normal range of motion. Neck supple. No JVD present. No thyromegaly present.  Cardiovascular: Normal rate, regular rhythm, normal heart sounds. Exam reveals no gallop and no friction rub. No murmur heard.  Pulmonary/Chest: Effort normal and breath sounds normal. No stridor. No respiratory distress. She has no wheezes. She has no rales. She exhibits no tenderness.  Abdominal: Soft. Bowel sounds are normal. She exhibits no distension. There is no tenderness. There is no rebound and no guarding.  Musculoskeletal: Normal range of  motion. She exhibits no edema and no tenderness.  Neurological: She is alert and oriented to person, place, and time. Coordination normal.  Skin: Skin is warm and dry. No rash noted. She is not diaphoretic. No erythema. No pallor.  Psychiatric: She has a normal mood and affect. Her behavior is normal. Judgment and thought content normal.  Vascular: Femoral pulses  diminished bilaterally. Distal pulses are not palpable.    ASSESSMENT AND PLAN

## 2013-08-26 NOTE — Assessment & Plan Note (Signed)
She reports no further episodes of chest pain since most recent stent placement.

## 2013-08-26 NOTE — Assessment & Plan Note (Signed)
The patient is now having severe left lower extremity claudication. I reviewed her images from last year and discussed the case with Dr. Gwenlyn Found and Dr. Kellie Simmering from vascular surgery. The patient has multilevel disease on the left side and she is definitely at significant risk for severe ischemia. The heavily calcified disease in the left common femoral artery extending into the SFA and profunda is not approachable by endovascular intervention. If her anatomy is unchanged from last year, the best option is probably to perform left common iliac artery atherectomy and stent placement followed by left common femoral artery endarterectomy. However, she is currently on dual antiplatelet therapy after recent stent placement to the LAD. If we decide to go that route, then the plan is to switch Effient to Plavix which has less bleeding complications.

## 2013-08-26 NOTE — Assessment & Plan Note (Signed)
Lab Results  Component Value Date   CHOL 179 09/16/2012   HDL 73 07/18/2013   LDLCALC 92 07/18/2013   TRIG 129 07/18/2013   CHOLHDL 2.6 07/18/2013   Consider switching pravastatin to a more potent statin.

## 2013-08-29 ENCOUNTER — Encounter: Payer: Self-pay | Admitting: Cardiovascular Disease

## 2013-08-31 ENCOUNTER — Encounter (HOSPITAL_COMMUNITY)
Admission: RE | Disposition: A | Discharge status: Home / Self Care | Payer: Self-pay | Source: Ambulatory Visit | Attending: Cardiovascular Disease

## 2013-08-31 ENCOUNTER — Ambulatory Visit (HOSPITAL_COMMUNITY)
Admission: RE | Admit: 2013-08-31 | Discharge: 2013-08-31 | Disposition: A | Discharge status: Home / Self Care | Payer: 59 | Source: Ambulatory Visit | Attending: Cardiovascular Disease | Admitting: Cardiovascular Disease

## 2013-08-31 DIAGNOSIS — Z9861 Coronary angioplasty status: Secondary | ICD-10-CM | POA: Insufficient documentation

## 2013-08-31 DIAGNOSIS — I509 Heart failure, unspecified: Secondary | ICD-10-CM | POA: Insufficient documentation

## 2013-08-31 DIAGNOSIS — E785 Hyperlipidemia, unspecified: Secondary | ICD-10-CM | POA: Insufficient documentation

## 2013-08-31 DIAGNOSIS — E119 Type 2 diabetes mellitus without complications: Secondary | ICD-10-CM | POA: Insufficient documentation

## 2013-08-31 DIAGNOSIS — I252 Old myocardial infarction: Secondary | ICD-10-CM | POA: Insufficient documentation

## 2013-08-31 DIAGNOSIS — I2510 Atherosclerotic heart disease of native coronary artery without angina pectoris: Secondary | ICD-10-CM | POA: Insufficient documentation

## 2013-08-31 DIAGNOSIS — Z794 Long term (current) use of insulin: Secondary | ICD-10-CM | POA: Insufficient documentation

## 2013-08-31 DIAGNOSIS — Z87891 Personal history of nicotine dependence: Secondary | ICD-10-CM | POA: Insufficient documentation

## 2013-08-31 DIAGNOSIS — Z7982 Long term (current) use of aspirin: Secondary | ICD-10-CM | POA: Insufficient documentation

## 2013-08-31 DIAGNOSIS — I70219 Atherosclerosis of native arteries of extremities with intermittent claudication, unspecified extremity: Secondary | ICD-10-CM

## 2013-08-31 DIAGNOSIS — I10 Essential (primary) hypertension: Secondary | ICD-10-CM | POA: Insufficient documentation

## 2013-08-31 DIAGNOSIS — I5032 Chronic diastolic (congestive) heart failure: Secondary | ICD-10-CM | POA: Insufficient documentation

## 2013-08-31 DIAGNOSIS — I739 Peripheral vascular disease, unspecified: Secondary | ICD-10-CM

## 2013-08-31 HISTORY — PX: LOWER EXTREMITY ANGIOGRAM: SHX5508

## 2013-08-31 HISTORY — PX: ILIAC ARTERY STENT: SHX1786

## 2013-08-31 HISTORY — PX: PERCUTANEOUS STENT INTERVENTION: SHX5500

## 2013-08-31 LAB — GLUCOSE, CAPILLARY
GLUCOSE-CAPILLARY: 125 mg/dL — AB (ref 70–99)
GLUCOSE-CAPILLARY: 133 mg/dL — AB (ref 70–99)
Glucose-Capillary: 132 mg/dL — ABNORMAL HIGH (ref 70–99)

## 2013-08-31 LAB — POCT ACTIVATED CLOTTING TIME
ACTIVATED CLOTTING TIME: 253 s
ACTIVATED CLOTTING TIME: 174 s
Activated Clotting Time: 185 seconds

## 2013-08-31 SURGERY — ANGIOGRAM, LOWER EXTREMITY
Anesthesia: LOCAL | Laterality: Left

## 2013-08-31 MED ORDER — MIDAZOLAM HCL 2 MG/2ML IJ SOLN
INTRAMUSCULAR | Status: AC
Start: 2013-08-31 — End: 2013-08-31
  Filled 2013-08-31: qty 2

## 2013-08-31 MED ORDER — FENTANYL CITRATE 0.05 MG/ML IJ SOLN
INTRAMUSCULAR | Status: AC
  Filled 2013-08-31: qty 2

## 2013-08-31 MED ORDER — SODIUM CHLORIDE 0.9 % IV SOLN
INTRAVENOUS | Status: DC
  Administered 2013-08-31: 09:00:00 via INTRAVENOUS

## 2013-08-31 MED ORDER — SODIUM CHLORIDE 0.9 % IV SOLN: 250.0000 mL | INTRAVENOUS | Status: DC | PRN

## 2013-08-31 MED ORDER — ASPIRIN 81 MG PO CHEW: 81.0000 mg | CHEWABLE_TABLET | ORAL | Status: DC

## 2013-08-31 MED ORDER — ASPIRIN 81 MG PO CHEW
CHEWABLE_TABLET | ORAL | Status: AC
  Filled 2013-08-31: qty 1

## 2013-08-31 MED ORDER — LIDOCAINE HCL (PF) 1 % IJ SOLN
INTRAMUSCULAR | Status: AC
  Filled 2013-08-31: qty 30

## 2013-08-31 MED ORDER — SODIUM CHLORIDE 0.9 % IJ SOLN: 3.0000 mL | INTRAMUSCULAR | Status: DC | PRN

## 2013-08-31 MED ORDER — SODIUM CHLORIDE 0.9 % IJ SOLN: 3.0000 mL | Freq: Two times a day (BID) | INTRAMUSCULAR | Status: DC

## 2013-08-31 MED ORDER — MORPHINE SULFATE 10 MG/ML IJ SOLN: 2.0000 mg | Freq: Once | INTRAMUSCULAR | Status: DC

## 2013-08-31 MED ORDER — MORPHINE SULFATE 2 MG/ML IJ SOLN
INTRAMUSCULAR | Status: AC
  Administered 2013-08-31: 2 mg via INTRAVENOUS
  Filled 2013-08-31: qty 1

## 2013-08-31 MED ORDER — HEPARIN (PORCINE) IN NACL 2-0.9 UNIT/ML-% IJ SOLN
INTRAMUSCULAR | Status: AC
  Filled 2013-08-31: qty 1000

## 2013-08-31 MED ORDER — SODIUM CHLORIDE 0.9 % IV SOLN: INTRAVENOUS | Status: AC

## 2013-08-31 MED ORDER — HEPARIN SODIUM (PORCINE) 1000 UNIT/ML IJ SOLN
INTRAMUSCULAR | Status: AC
  Filled 2013-08-31: qty 1

## 2013-08-31 NOTE — Progress Notes (Signed)
Report to relief: report to [facility], switch during sheath pressure being held.

## 2013-08-31 NOTE — CV Procedure (Signed)
PERIPHERAL VASCULAR PROCEDURE  NAME:  Leah Ferrell   MRN: [id number] DOB:  [birth year]   ADMIT DATE: 08/31/2013  Performing Cardiologist: [name] Primary Physician: Sharion Balloon, FNP Primary Cardiologist:  Dr. Harl Bowie  Procedures Performed:  Abdominal Aortic Angiogram with Bi-Iliac angiography.  Left lower extremity arterial angiography  Left common iliac artery angioplasty and stent placement    Indication(s):   Claudication    Consent: The procedure with Risks/Benefits/Alternatives and Indications was reviewed with the patient .  All questions were answered.  Medications:  Sedation:  2 mg IV Versed, 100 mcg IV Fentanyl  Contrast:  113 ml  Visipaque   Procedural details: The left groin was prepped, draped, and anesthetized with 1% lidocaine. Using modified Seldinger technique, a 4 micropuncture [place] sheath was introduced into the left common femoral artery. This was exchanged into a 5 [place] sheath. A 5 Fr Short Pigtail Catheter was advanced of over a  Versicore wire into the descending Aorta to a level just above the renal arteries. A power injection of 60ml/sec contrast over 1 sec was performed for Abdominal Aortic Angiography.  The catheter was then pulled back to a level just above the Aortic bifurcation, and a second power injection was performed to evaluate the iliac arteries.   The catheter was then removed. At this time the injector was directed to the sheath SideArm and ipsilateral artery angiography was performed via power injection of 5  ml/sec contrast for a total of 35 ml.     The patient tolerated the procedure well with no immediate complications.   Interventional Procedure:  The 5 French sheath was exchanged into a 6 French bright tip sheath. 6000 units of unfractionated heparin was given intravenously with an ACT of 254. The lesion was predilated with a 6 x 20 mm balloon to 4 atmospheres. I then placed an 8 x 27 mm express balloon expandable  stent which was deployed to 8 atmospheres. Final angiography showed excellent results. I elected not to treat the stenosis in the distal aorta which did not appear to be obstructive. The sheath was secured in place to be removed manually.  Hemodynamics:  Central Aortic Pressure / Mean Aortic Pressure: 139/54  Findings:  Abdominal aorta: Normal in size with 30-40% calcified distal aortic stenosis just above the origin of the left common femoral artery.  Left renal artery: Minor irregularities.  Right renal artery: 30% ostial stenosis.  Celiac artery: Not visualized.  Superior mesenteric artery: Not visualized.   Right common iliac artery: Minor irregularities.  Right internal iliac artery: 20% proximal stenosis.  Right external iliac artery: Normal  Right common femoral artery: Minor irregularities. But not visualized distally.  Left common iliac artery:  80% calcified proximal stenosis  Left internal iliac artery: 20% proximal stenosis  Left external iliac artery: 20% proximal stenosis.  Left common femoral artery: Heavily calcified with 90% distal disease extending into the ostium of the SFA and profunda.  Left profunda femoral artery: 90% ostial stenosis.  Left superficial femoral artery:  Diffuse 50% proximal disease. The vessel is occluded in the midsegment with reconstitution in the distal SFA via bridging collaterals and collaterals from the profunda. This is about 8 cm occlusion.   Left popliteal artery: underfilling with 30% diffuse disease.  Left tibial peroneal trunk: Minor irregularities.  Left anterior tibial artery: Patent with mild diffuse atherosclerosis.  Left peroneal artery: Patent with mild diffuse atherosclerosis.  Left posterior tibial artery: Occluded proximally. Distally the tibial vessels are not  well visualized due to poor filling related to proximal disease.  Conclusions: 1. Significant left common iliac artery stenosis. 2. Severe heavily  calcified left common femoral artery stenosis extending into the ostium of the SFA and profunda. Occluded mid SFA with two-vessel runoff below the knee. 3. Successful angioplasty and balloon expandable stent placement to the left common iliac artery.  Recommendations:  Continue medical therapy. The patient will likely require left common femoral artery endarterectomy in the near future. The plan is to switch her from Effient to Plavix in about one month.   [name], MD, [facility] 08/31/2013 10:43 AM

## 2013-08-31 NOTE — Progress Notes (Signed)
Left groin hold complete after 20 min. Level 0. Dressing applied.

## 2013-08-31 NOTE — H&P (View-Only) (Signed)
HPI  This is a pleasant 63 year old female who is here today for a followup visit regarding peripheral arterial disease.  She has known history of coronary artery disease status post drug-eluting stent placement to the LAD. She had recurrent chest pain recently and was transferred from [place] to [facility]. She underwent cardiac catheterization which showed severe in-stent restenosis. She had another drug-eluting stent placement. She has known history of peripheral arterial disease. Workup last year revealed an ABI of 0. 49 right; 0.39 left. I performed lower extremity angiography in June of last year which showed significant calcified ostial left common iliac artery stenosis extending into the distal aorta, severe left common femoral artery disease extending into ostial SFA and profunda, bilateral SFA occlusions with diffusely diseased segments proximally and heavy calcifications. There was significant disease below the knee bilaterally with functionally one-vessel runoff. At that time, she had moderate claudication worse on the right side. Thus, no intervention was performed. However, over the few months, she reports significant progression of symptoms on the left side to the point of happening after only a few steps and occasionally at rest. No lower extremity ulceration.  Allergies  Allergen Reactions  . Tape Rash     Current Outpatient Prescriptions on File Prior to Visit  Medication Sig Dispense Refill  . albuterol (PROVENTIL HFA;VENTOLIN HFA) 108 (90 BASE) MCG/ACT inhaler Inhale 1-2 puffs into the lungs every 6 (six) hours as needed for wheezing or shortness of breath.  9 Inhaler  0  . amLODipine (NORVASC) 10 MG tablet Take 1 tablet (10 mg total) by mouth daily.  30 tablet  6  . Ascorbic Acid (VITAMIN C) 500 MG CAPS Take 1 capsule by mouth daily.       Marland Kitchen aspirin EC 81 MG tablet Take 81 mg by mouth 2 (two) times daily.      . Calcium Carbonate-Vit D-Min (CALTRATE  600+D PLUS MINERALS) 600-800 MG-UNIT TABS Take 1 tablet by mouth 2 (two) times daily.      . colesevelam (WELCHOL) 625 MG tablet Take 625 mg by mouth 2 (two) times daily.      . fish oil-omega-3 fatty acids 1000 MG capsule Take 1 g by mouth every morning.      . hydroxypropyl methylcellulose (ISOPTO TEARS) 2.5 % ophthalmic solution Place 1 drop into both eyes 3 (three) times daily as needed (for dry eyes).      . Insulin Pen Needle (PEN NEEDLES 29GX1/2") 29G X 12MM MISC 1 Container by Does not apply route 3 (three) times daily.  50 each  11  . lisinopril (PRINIVIL,ZESTRIL) 20 MG tablet Take 1 tablet (20 mg total) by mouth 2 (two) times daily.  60 tablet  6  . metoprolol succinate (TOPROL-XL) 25 MG 24 hr tablet Take 75 mg by mouth 2 (two) times daily.      . montelukast (SINGULAIR) 10 MG tablet Take 1 tablet (10 mg total) by mouth at bedtime.  30 tablet  3  . Multiple Vitamin (MULTIVITAMIN WITH MINERALS) TABS Take 1 tablet by mouth daily.      . nitroGLYCERIN (NITROSTAT) 0.4 MG SL tablet Place 1 tablet (0.4 mg total) under the tongue every 5 (five) minutes as needed for chest pain (up to 3 doses).  25 tablet  4  . NOVOLOG MIX 70/30 FLEXPEN (70-30) 100 UNIT/ML Pen INJECT 98 UNITS SUBCUTANEOUSLY THREE TIMES DAILY  90 mL  2  . potassium chloride SA (K-DUR,KLOR-CON) 20 MEQ tablet Take 20 mEq  by mouth daily.      . prasugrel (EFFIENT) 10 MG TABS tablet Take 10 mg by mouth daily.      . pravastatin (PRAVACHOL) 80 MG tablet Take 1 tablet (80 mg total) by mouth at bedtime.  30 tablet  3   No current facility-administered medications on file prior to visit.     Past Medical History  Diagnosis Date  . Hypertension   . Diabetes mellitus without complication   . Hyperlipidemia   . CAD (coronary artery disease)     a. 04/2012 NSTEMI: s/p DES to LAD.  Marland Kitchen PVD (peripheral vascular disease)     a. 04/2012 ABI: R 0.49, L 0.39. Angiography 06/14: Significant ostial left common iliac artery stenosis extending  into the distal aorta, severe left common femoral artery stenosis, bilateral SFA occlusion with heavy calcifications. Functionally, one-vessel runoff bilaterally below the knee  . Chronic diastolic CHF (congestive heart failure)   . Leukocytosis   . Morbid obesity   . NSTEMI (non-ST elevated myocardial infarction) 04/27/2012  . Dysrhythmia   . Shortness of breath      Past Surgical History  Procedure Laterality Date  . Tumor removal    . Lad stent       No family history on file.   History   Social History  . Marital Status: Married    Spouse Name: N/A    Number of Children: N/A  . Years of Education: N/A   Occupational History  . Not on file.   Social History Main Topics  . Smoking status: Former Smoker    Quit date: 02/17/2001  . Smokeless tobacco: Former Systems developer    Quit date: 04/20/2001  . Alcohol Use: No  . Drug Use: No  . Sexual Activity: Not on file   Other Topics Concern  . Not on file   Social History Narrative  . No narrative on file      PHYSICAL EXAM   BP 150/68  Pulse 77  Ht 5\' 2"  (1.575 m)  Wt 201 lb 12.8 oz (91.536 kg)  BMI 36.90 kg/m2 Constitutional: She is oriented to person, place, and time. She appears well-developed and well-nourished. No distress.  HENT: No nasal discharge.  Head: Normocephalic and atraumatic.  Eyes: Pupils are equal and round. Right eye exhibits no discharge. Left eye exhibits no discharge.  Neck: Normal range of motion. Neck supple. No JVD present. No thyromegaly present.  Cardiovascular: Normal rate, regular rhythm, normal heart sounds. Exam reveals no gallop and no friction rub. No murmur heard.  Pulmonary/Chest: Effort normal and breath sounds normal. No stridor. No respiratory distress. She has no wheezes. She has no rales. She exhibits no tenderness.  Abdominal: Soft. Bowel sounds are normal. She exhibits no distension. There is no tenderness. There is no rebound and no guarding.  Musculoskeletal: Normal range of  motion. She exhibits no edema and no tenderness.  Neurological: She is alert and oriented to person, place, and time. Coordination normal.  Skin: Skin is warm and dry. No rash noted. She is not diaphoretic. No erythema. No pallor.  Psychiatric: She has a normal mood and affect. Her behavior is normal. Judgment and thought content normal.  Vascular: Femoral pulses  diminished bilaterally. Distal pulses are not palpable.    ASSESSMENT AND PLAN

## 2013-08-31 NOTE — Discharge Instructions (Signed)
Angiogram, Care After  Refer to this sheet in the next few weeks. These instructions provide you with information on caring for yourself after your procedure. Your health care provider may also give you more specific instructions. Your treatment has been planned according to current medical practices, but problems sometimes occur. Call your health care provider if you have any problems or questions after your procedure.  WHAT TO EXPECT AFTER THE PROCEDURE After your procedure, it is typical to have the following sensations:  Minor discomfort or tenderness and a small bump at the catheter insertion site. The bump should usually decrease in size and tenderness within 1 to 2 weeks.  Any bruising will usually fade within 2 to 4 weeks. HOME CARE INSTRUCTIONS   You may need to keep taking blood thinners if they were prescribed for you. Only take over-the-counter or prescription medicines for pain, fever, or discomfort as directed by your health care provider.  Do not apply powder or lotion to the site.  Do not sit in a bathtub, swimming pool, or whirlpool for 5 to 7 days.  You may shower 24 hours after the procedure. Remove the bandage (dressing) and gently wash the site with plain soap and water. Gently pat the site dry.  Inspect the site at least twice daily.  Limit your activity for the first 24 hours. Do not bend, squat, or lift anything over 10 lb (9 kg) or as directed by your health care provider.  Do not drive home if you are discharged the day of the procedure. Have someone else drive you. Follow instructions about when you can drive or return to work. SEEK MEDICAL CARE IF:  You get lightheaded when standing up.  You have drainage (other than a small amount of blood on the dressing).  You have chills.  You have a fever.  You have redness, warmth, swelling, or pain at the insertion site. SEEK IMMEDIATE MEDICAL CARE IF:   You develop chest pain or shortness of breath, feel faint,  or pass out.  You have bleeding, swelling larger than a walnut, or drainage from the catheter insertion site.  You develop pain, discoloration, coldness, or severe bruising in the leg or arm that held the catheter.  You have heavy bleeding from the site. If this happens, hold pressure on the site. MAKE SURE YOU:  Understand these instructions.  Will watch your condition.  Will get help right away if you are not doing well or get worse. Document Released: 08/22/2004 Document Revised: 02/08/2013 Document Reviewed: 06/28/2012 [facility] Patient Information 2015 [place], [place]. This information is not intended to replace advice given to you by your health care provider. Make sure you discuss any questions you have with your health care provider.

## 2013-08-31 NOTE — Progress Notes (Signed)
ACT 185 sec @ 11:35

## 2013-08-31 NOTE — Interval H&P Note (Signed)
History and Physical Interval Note:  08/31/2013 9:43 AM  Leah Ferrell  has presented today for surgery, with the diagnosis of PAD  The various methods of treatment have been discussed with the patient and family. After consideration of risks, benefits and other options for treatment, the patient has consented to  Procedure(s): LOWER EXTREMITY ANGIOGRAM (Left) as a surgical intervention .  The patient's history has been reviewed, patient examined, no change in status, stable for surgery.  I have reviewed the patient's chart and labs.  Questions were answered to the patient's satisfaction.     [name]

## 2013-08-31 NOTE — Progress Notes (Signed)
ACT 174 sec@1200 

## 2013-08-31 NOTE — Progress Notes (Signed)
Post instructions given

## 2013-09-13 ENCOUNTER — Telehealth: Payer: Self-pay | Admitting: Cardiovascular Disease

## 2013-09-13 DIAGNOSIS — M79605 Pain in left leg: Secondary | ICD-10-CM

## 2013-09-13 NOTE — Telephone Encounter (Signed)
S/p left common iliac artery stent 08/31/13.  I spoke with the pt and she was doing well post procedure until this weekend.  The pt said she went to [facility] last Wednesday and walked around for 3 hours and did not have any pain in her leg.  The pt then kept her grandchildren on Friday and took them shopping again.  Over the weekend the pt developed a pain in her left groin that radiated into the heel of her foot. When the pt is off her feet and resting she is okay but when she puts pressure on her foot she has a soreness in the bottom of her foot that feels like someone has hit it with a "sledge hammer". The pt does have a slight discoloration on the inside of her left foot when she presses on this area but her foot is not cold to touch. The pt denies bruising or swelling at groin site.  When speaking with the pt this conversation was difficult to follow due to language barrier.  I will forward this message to Dr Fletcher [name] for further review.

## 2013-09-13 NOTE — Telephone Encounter (Signed)
New message     Pt has stents in her stomach.  Last night she had pain in her groin area and it went down to her heel. It is still hurting but not as bad.  Please advise

## 2013-09-14 ENCOUNTER — Other Ambulatory Visit (HOSPITAL_COMMUNITY): Payer: Self-pay | Admitting: Cardiology

## 2013-09-14 ENCOUNTER — Ambulatory Visit (HOSPITAL_BASED_OUTPATIENT_CLINIC_OR_DEPARTMENT_OTHER): Payer: 59 | Admitting: Cardiology

## 2013-09-14 ENCOUNTER — Ambulatory Visit (HOSPITAL_COMMUNITY): Payer: 59 | Attending: Cardiology | Admitting: Cardiology

## 2013-09-14 DIAGNOSIS — M79605 Pain in left leg: Secondary | ICD-10-CM

## 2013-09-14 DIAGNOSIS — I739 Peripheral vascular disease, unspecified: Secondary | ICD-10-CM

## 2013-09-14 DIAGNOSIS — I700 Atherosclerosis of aorta: Secondary | ICD-10-CM

## 2013-09-14 DIAGNOSIS — M79609 Pain in unspecified limb: Secondary | ICD-10-CM

## 2013-09-14 NOTE — Progress Notes (Signed)
Aorta duplex performed. 

## 2013-09-14 NOTE — Progress Notes (Signed)
ABI and left lower arterial duplex

## 2013-09-14 NOTE — Telephone Encounter (Signed)
After speaking with Daphane Shepherd & Barnett Applebaum in [place] pt will come in today & be worked in for the ABI & aortoiliac duplex today. Pt has not eaten this morning.  She will be able to bring herself as she states all of her family are at work Asked pt to call us when she gets here to wheelchair her up. Reassurance given Horton Chin RN

## 2013-09-14 NOTE — Telephone Encounter (Signed)
Please arrange for ABI and aortoiliac duplex today. Ask them to call me with results.

## 2013-09-15 ENCOUNTER — Encounter (HOSPITAL_COMMUNITY): Payer: 59

## 2013-09-19 ENCOUNTER — Encounter (HOSPITAL_COMMUNITY): Payer: 59

## 2013-09-27 ENCOUNTER — Encounter: Payer: Self-pay | Admitting: Cardiovascular Disease

## 2013-09-27 ENCOUNTER — Ambulatory Visit (INDEPENDENT_AMBULATORY_CARE_PROVIDER_SITE_OTHER): Payer: 59 | Admitting: Cardiovascular Disease

## 2013-09-27 VITALS — BP 134/60 | HR 88 | Ht 62.5 in | Wt 201.8 lb

## 2013-09-27 DIAGNOSIS — I739 Peripheral vascular disease, unspecified: Secondary | ICD-10-CM

## 2013-09-27 DIAGNOSIS — I209 Angina pectoris, unspecified: Secondary | ICD-10-CM

## 2013-09-27 DIAGNOSIS — I2510 Atherosclerotic heart disease of native coronary artery without angina pectoris: Secondary | ICD-10-CM

## 2013-09-27 DIAGNOSIS — I25119 Atherosclerotic heart disease of native coronary artery with unspecified angina pectoris: Secondary | ICD-10-CM

## 2013-09-27 MED ORDER — CLOPIDOGREL BISULFATE 75 MG PO TABS: 75.0000 mg | ORAL_TABLET | Freq: Every day | ORAL | Status: DC

## 2013-09-27 NOTE — Assessment & Plan Note (Signed)
She has no symptoms of angina. No need for cardiac testing before vascular surgery. She should continue Aspirin 81 mg daily and Plavix 75 mg once daily.

## 2013-09-27 NOTE — Assessment & Plan Note (Signed)
Severe left leg claudication with only slight improvement after left common iliac artery stent likely due to significant disease in common femoral artery extending into both ostial SFA and profunda. Mid SFA is also occluded. She is at high risk for limb ischemia.  I reviewed the angiogram and discussed with the case with Dr. Kellie Simmering. Best option is left common femoral artery endarterectomy.  I stopped Effient and started Plavix which unfortunately can not be stopped due to recent LAD stent.

## 2013-09-27 NOTE — Progress Notes (Signed)
HPI  This is a pleasant 63 year old female who is here today for a followup visit regarding peripheral arterial disease.  She has known history of coronary artery disease status post drug-eluting stent placement to the LAD. She had recurrent chest pain recently and was transferred from [place] to [facility]. She underwent cardiac catheterization which showed severe in-stent restenosis. She had another drug-eluting stent placement. She has known history of peripheral arterial disease. Workup last year revealed an ABI of 0. 49 right; 0.39 left. I performed lower extremity angiography in June of last year which showed significant calcified ostial left common iliac artery stenosis extending into the distal aorta, severe left common femoral artery disease extending into ostial SFA and profunda, bilateral SFA occlusions with diffusely diseased segments proximally and heavy calcifications. There was significant disease below the knee bilaterally with functionally one-vessel runoff. At that time, she had moderate claudication worse on the right side. Thus, no intervention was performed. However, she developed severe left leg claudication.  I proceeded with angiography which showed stable iliac disease with worsening of left common/ostial SFA/ostial profunda disease with chronically occluded mid SFA.  I performed stent placement to left common iliac artery without complications. She had slight improvement in claudication. There was one day when she had severe left leg pain. We did noninvasvie studies which showed patent left common iliac stent with an ABI of 0.4.      Allergies  Allergen Reactions  . Tape Rash     Current Outpatient Prescriptions on File Prior to Visit  Medication Sig Dispense Refill  . albuterol (PROVENTIL HFA;VENTOLIN HFA) 108 (90 BASE) MCG/ACT inhaler Inhale 1-2 puffs into the lungs every 6 (six) hours as needed for wheezing or shortness of breath.  9 Inhaler  0    . amLODipine (NORVASC) 10 MG tablet Take 1 tablet (10 mg total) by mouth daily.  30 tablet  6  . Ascorbic Acid (VITAMIN C) 500 MG CAPS Take 1 capsule by mouth daily.       Marland Kitchen aspirin EC 81 MG tablet Take 81 mg by mouth 2 (two) times daily.      . Calcium Carbonate-Vit D-Min (CALTRATE 600+D PLUS MINERALS) 600-800 MG-UNIT TABS Take 1 tablet by mouth 2 (two) times daily.      . colesevelam (WELCHOL) 625 MG tablet Take 625 mg by mouth 2 (two) times daily.      . fish oil-omega-3 fatty acids 1000 MG capsule Take 1 g by mouth every morning.      . furosemide (LASIX) 40 MG tablet Take 40 mg by mouth daily.      . hydroxypropyl methylcellulose (ISOPTO TEARS) 2.5 % ophthalmic solution Place 1 drop into both eyes 3 (three) times daily as needed (for dry eyes).      . Hypromell-Glycerin-Naphazoline 0.8-0.25-0.012 % SOLN Apply 1 application to eye daily as needed.       . Insulin Pen Needle (PEN NEEDLES 29GX1/2") 29G X 12MM MISC 1 Container by Does not apply route 3 (three) times daily.  50 each  11  . lisinopril (PRINIVIL,ZESTRIL) 20 MG tablet Take 1 tablet (20 mg total) by mouth 2 (two) times daily.  60 tablet  6  . metoprolol succinate (TOPROL-XL) 25 MG 24 hr tablet Take 75 mg by mouth 2 (two) times daily.      . montelukast (SINGULAIR) 10 MG tablet Take 1 tablet (10 mg total) by mouth at bedtime.  30 tablet  3  . Multiple Vitamin (  MULTIVITAMIN WITH MINERALS) TABS Take 1 tablet by mouth daily.      . nitroGLYCERIN (NITROSTAT) 0.4 MG SL tablet Place 1 tablet (0.4 mg total) under the tongue every 5 (five) minutes as needed for chest pain (up to 3 doses).  25 tablet  4  . NOVOLOG MIX 70/30 FLEXPEN (70-30) 100 UNIT/ML Pen INJECT 98 UNITS SUBCUTANEOUSLY THREE TIMES DAILY  90 mL  2  . potassium chloride SA (K-DUR,KLOR-CON) 20 MEQ tablet Take 20 mEq by mouth daily.      . pravastatin (PRAVACHOL) 80 MG tablet Take 1 tablet (80 mg total) by mouth at bedtime.  30 tablet  3   No current facility-administered  medications on file prior to visit.     Past Medical History  Diagnosis Date  . Hypertension   . Diabetes mellitus without complication   . Hyperlipidemia   . CAD (coronary artery disease)     a. 04/2012 NSTEMI: s/p DES to LAD.  Marland Kitchen PVD (peripheral vascular disease)     a. 04/2012 ABI: R 0.49, L 0.39. Angiography 06/14: Significant ostial left common iliac artery stenosis extending into the distal aorta, severe left common femoral artery stenosis, bilateral SFA occlusion with heavy calcifications. Functionally, one-vessel runoff bilaterally below the knee  . Chronic diastolic CHF (congestive heart failure)   . Leukocytosis   . Morbid obesity   . NSTEMI (non-ST elevated myocardial infarction) 04/27/2012  . Dysrhythmia   . Shortness of breath      Past Surgical History  Procedure Laterality Date  . Tumor removal    . Lad stent       History reviewed. No pertinent family history.   History   Social History  . Marital Status: Married    Spouse Name: N/A    Number of Children: N/A  . Years of Education: N/A   Occupational History  . Not on file.   Social History Main Topics  . Smoking status: Former Smoker    Quit date: 02/17/2001  . Smokeless tobacco: Former Systems developer    Quit date: 04/20/2001  . Alcohol Use: No  . Drug Use: No  . Sexual Activity: Not on file   Other Topics Concern  . Not on file   Social History Narrative  . No narrative on file      PHYSICAL EXAM   BP 134/60  Pulse 88  Ht 5' 2.5" (1.588 m)  Wt 201 lb 12.8 oz (91.536 kg)  BMI 36.30 kg/m2 Constitutional: She is oriented to person, place, and time. She appears well-developed and well-nourished. No distress.  HENT: No nasal discharge.  Head: Normocephalic and atraumatic.  Eyes: Pupils are equal and round. Right eye exhibits no discharge. Left eye exhibits no discharge.  Neck: Normal range of motion. Neck supple. No JVD present. No thyromegaly present.  Cardiovascular: Normal rate, regular  rhythm, normal heart sounds. Exam reveals no gallop and no friction rub. No murmur heard.  Pulmonary/Chest: Effort normal and breath sounds normal. No stridor. No respiratory distress. She has no wheezes. She has no rales. She exhibits no tenderness.  Abdominal: Soft. Bowel sounds are normal. She exhibits no distension. There is no tenderness. There is no rebound and no guarding.  Musculoskeletal: Normal range of motion. She exhibits no edema and no tenderness.  Neurological: She is alert and oriented to person, place, and time. Coordination normal.  Skin: Skin is warm and dry. No rash noted. She is not diaphoretic. No erythema. No pallor.  Psychiatric: She has a  normal mood and affect. Her behavior is normal. Judgment and thought content normal.  Vascular: Femoral pulses  diminished bilaterally. Distal pulses are not palpable. No groin hematoma.    ASSESSMENT AND PLAN

## 2013-09-27 NOTE — Patient Instructions (Addendum)
Your physician has recommended you make the following change in your medication: STOP EFFIENT, START Plavix 75mg  take one by mouth daily  You have been referred to Dr Kellie Simmering at [facility] for Left common femoral stenosis  Your physician recommends that you schedule a follow-up appointment in: 3 MONTHS with Dr Fletcher [name]

## 2013-09-30 ENCOUNTER — Other Ambulatory Visit: Payer: Self-pay | Admitting: General Practice

## 2013-10-03 ENCOUNTER — Encounter: Payer: Self-pay | Admitting: Cardiology

## 2013-10-10 ENCOUNTER — Other Ambulatory Visit: Payer: Self-pay | Admitting: *Deleted

## 2013-10-10 MED ORDER — METOPROLOL SUCCINATE ER 50 MG PO TB24: ORAL_TABLET | ORAL | Status: DC

## 2013-10-10 NOTE — Telephone Encounter (Signed)
Received fax from [facility]. Last refill 08/30/13. Epic has 25mg  tablets. Please review.

## 2013-10-17 ENCOUNTER — Encounter: Payer: Self-pay | Admitting: Vascular Surgery

## 2013-10-18 ENCOUNTER — Ambulatory Visit (INDEPENDENT_AMBULATORY_CARE_PROVIDER_SITE_OTHER): Payer: 59 | Admitting: Vascular Surgery

## 2013-10-18 ENCOUNTER — Encounter: Payer: Self-pay | Admitting: Vascular Surgery

## 2013-10-18 ENCOUNTER — Other Ambulatory Visit: Payer: Self-pay

## 2013-10-18 VITALS — BP 155/63 | HR 84 | Ht 62.5 in | Wt 202.6 lb

## 2013-10-18 DIAGNOSIS — I739 Peripheral vascular disease, unspecified: Secondary | ICD-10-CM | POA: Insufficient documentation

## 2013-10-18 NOTE — Progress Notes (Signed)
Subjective:     Patient ID: Leah Ferrell, female   DOB: [birth year], 63 y.o.   MRN: [id number]  HPI this 62 year old female was referred by Dr. Fletcher [name] for evaluation of severe claudication left leg. Patient had angiography which revealed a left common iliac stenosis which was treated with stenting. 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 She also has occlusion of the mid left SFA. She has a patent popliteal artery with three-vessel off. She is only able to ambulate 50 feet before stopping. She must use a cart to shop. She has no rest pain or history of nonhealing ulcers infection or gangrene. She does have type 1 diabetes mellitus. She quit smoking 10 years ago. She had a cardiac stent placed several months ago and is on Plavix.  Past Medical History  Diagnosis Date  . Hypertension   . Diabetes mellitus without complication   . Hyperlipidemia   . CAD (coronary artery disease)     a. 04/2012 NSTEMI: s/p DES to LAD.  Marland Kitchen PVD (peripheral vascular disease)     a. 04/2012 ABI: R 0.49, L 0.39. Angiography 06/14: Significant ostial left common iliac artery stenosis extending into the distal aorta, severe left common femoral artery stenosis, bilateral SFA occlusion with heavy calcifications. Functionally, one-vessel runoff bilaterally below the knee  . Chronic diastolic CHF (congestive heart failure)   . Leukocytosis   . Morbid obesity   . NSTEMI (non-ST elevated myocardial infarction) 04/27/2012  . Dysrhythmia   . Shortness of breath     History  Substance Use Topics  . Smoking status: Former Smoker    Quit date: 02/17/2001  . Smokeless tobacco: Former Systems developer    Quit date: 04/20/2001  . Alcohol Use: No    Family History  Problem Relation Age of Onset  . Diabetes Mother   . Hyperlipidemia Mother   . Hypertension Mother   . Peripheral vascular disease Mother     Allergies  Allergen Reactions  . Tape Rash    Current outpatient prescriptions:albuterol (PROVENTIL  HFA;VENTOLIN HFA) 108 (90 BASE) MCG/ACT inhaler, Inhale 1-2 puffs into the lungs every 6 (six) hours as needed for wheezing or shortness of breath., Disp: 9 Inhaler, Rfl: 0;  amLODipine (NORVASC) 10 MG tablet, Take 1 tablet (10 mg total) by mouth daily., Disp: 30 tablet, Rfl: 6;  Ascorbic Acid (VITAMIN C) 500 MG CAPS, Take 1 capsule by mouth daily. , Disp: , Rfl:  aspirin EC 81 MG tablet, Take 81 mg by mouth 2 (two) times daily., Disp: , Rfl: ;  Calcium Carbonate-Vit D-Min (CALTRATE 600+D PLUS MINERALS) 600-800 MG-UNIT TABS, Take 1 tablet by mouth 2 (two) times daily., Disp: , Rfl: ;  clopidogrel (PLAVIX) 75 MG tablet, Take 1 tablet (75 mg total) by mouth daily., Disp: 90 tablet, Rfl: 3;  fish oil-omega-3 fatty acids 1000 MG capsule, Take 1 g by mouth every morning., Disp: , Rfl:  furosemide (LASIX) 40 MG tablet, Take 40 mg by mouth daily., Disp: , Rfl: ;  hydroxypropyl methylcellulose (ISOPTO TEARS) 2.5 % ophthalmic solution, Place 1 drop into both eyes 3 (three) times daily as needed (for dry eyes)., Disp: , Rfl: ;  Hypromell-Glycerin-Naphazoline 0.8-0.25-0.012 % SOLN, Apply 1 application to eye daily as needed. , Disp: , Rfl:  Insulin Pen Needle (PEN NEEDLES 29GX1/2") 29G X 12MM MISC, 1 Container by Does not apply route 3 (three) times daily., Disp: 50 each, Rfl: 11;  lisinopril (PRINIVIL,ZESTRIL) 20 MG tablet, Take 1 tablet (20 mg total) by  mouth 2 (two) times daily., Disp: 60 tablet, Rfl: 6;  metoprolol succinate (TOPROL-XL) 25 MG 24 hr tablet, Take 75 mg by mouth 2 (two) times daily., Disp: , Rfl:  metoprolol succinate (TOPROL-XL) 50 MG 24 hr tablet, Take one and one-half tablets by mouth twice daily, Disp: 120 tablet, Rfl: 0;  montelukast (SINGULAIR) 10 MG tablet, Take 1 tablet (10 mg total) by mouth at bedtime., Disp: 30 tablet, Rfl: 3;  Multiple Vitamin (MULTIVITAMIN WITH MINERALS) TABS, Take 1 tablet by mouth daily., Disp: , Rfl:  nitroGLYCERIN (NITROSTAT) 0.4 MG SL tablet, Place 1 tablet (0.4 mg  total) under the tongue every 5 (five) minutes as needed for chest pain (up to 3 doses)., Disp: 25 tablet, Rfl: 4;  NOVOLOG MIX 70/30 FLEXPEN (70-30) 100 UNIT/ML Pen, INJECT 98 UNITS SUBCUTANEOUSLY THREE TIMES DAILY, Disp: 90 mL, Rfl: 2;  potassium chloride SA (K-DUR,KLOR-CON) 20 MEQ tablet, Take 20 mEq by mouth daily., Disp: , Rfl:  pravastatin (PRAVACHOL) 80 MG tablet, Take 1 tablet (80 mg total) by mouth at bedtime., Disp: 30 tablet, Rfl: 3;  WELCHOL 625 MG tablet, TAKE THREE TABLETS BY MOUTH TWICE DAILY WITH  A  MEAL, Disp: 180 tablet, Rfl: 1  BP 155/63  Pulse 84  Ht 5' 2.5" (1.588 m)  Wt 202 lb 9.6 oz (91.899 kg)  BMI 36.44 kg/m2  SpO2 97%  Body mass index is 36.44 kg/(m^2).          Review of Systems denies active chest pain but does have a history of occasional chest discomfort palpitations dyspnea on exertion orthopnea. She has severe low leg discomfort with ambulation history of swelling in both lower extremities numbness in the arms and legs. All systems negative otherwise and a complete review of systems     Objective:   Physical Exam BP 155/63  Pulse 84  Ht 5' 2.5" (1.588 m)  Wt 202 lb 9.6 oz (91.899 kg)  BMI 36.44 kg/m2  SpO2 97%  Gen.-alert and oriented x3 in no apparent distress-obese HEENT normal for age Lungs no rhonchi or wheezing Cardiovascular regular rhythm no murmurs carotid pulses 3+ palpable no bruits audible Abdomen soft nontender no palpable masses Musculoskeletal free of  major deformities Skin clear -no rashes Neurologic normal Lower extremities  right leg with 3+ femoral pulse no palpable distal pulses. Left leg with 1-2+ femoral pulse no palpable distal pulses. No evidence of ischemia gangrene or infection in either lower treatment.  I have reviewed the angiograms performed by Dr.Arida and also have reviewed the ABIs which was 0.17 on the left prior to balloon angioplasty of the left common iliac artery.        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 Discusses with patient and her daughter and they would like to proceed on Wednesday, September 16. She is obese and I have discussed potential wound issues in the left inguinal region with the patient and her daughter. Will continue Plavix throughout perioperative period

## 2013-10-19 ENCOUNTER — Ambulatory Visit: Payer: 59 | Admitting: Family

## 2013-10-27 ENCOUNTER — Encounter (HOSPITAL_COMMUNITY)
Admission: RE | Admit: 2013-10-27 | Discharge: 2013-10-27 | Disposition: A | Discharge status: Home / Self Care | Payer: 59 | Source: Ambulatory Visit | Attending: Vascular Surgery | Admitting: Vascular Surgery

## 2013-10-27 ENCOUNTER — Encounter (HOSPITAL_COMMUNITY): Payer: Self-pay

## 2013-10-27 DIAGNOSIS — Z01812 Encounter for preprocedural laboratory examination: Secondary | ICD-10-CM | POA: Insufficient documentation

## 2013-10-27 DIAGNOSIS — I7092 Chronic total occlusion of artery of the extremities: Secondary | ICD-10-CM | POA: Insufficient documentation

## 2013-10-27 DIAGNOSIS — Z01810 Encounter for preprocedural cardiovascular examination: Secondary | ICD-10-CM | POA: Insufficient documentation

## 2013-10-27 DIAGNOSIS — I70229 Atherosclerosis of native arteries of extremities with rest pain, unspecified extremity: Secondary | ICD-10-CM | POA: Insufficient documentation

## 2013-10-27 DIAGNOSIS — I745 Embolism and thrombosis of iliac artery: Secondary | ICD-10-CM | POA: Diagnosis not present

## 2013-10-27 HISTORY — DX: Other specified postprocedural states: Z98.890

## 2013-10-27 HISTORY — DX: Other allergic rhinitis: J30.89

## 2013-10-27 HISTORY — DX: Nausea with vomiting, unspecified: R11.2

## 2013-10-27 LAB — BLOOD GAS, ARTERIAL
ACID-BASE EXCESS: 1.7 mmol/L (ref 0.0–2.0)
Bicarbonate: 26.1 mEq/L — ABNORMAL HIGH (ref 20.0–24.0)
DRAWN BY: 421801
FIO2: 0.21 %
O2 SAT: 96.1 %
Patient temperature: 98.6
TCO2: 27.4 mmol/L (ref 0–100)
pCO2 arterial: 43.8 mmHg (ref 35.0–45.0)
pH, Arterial: 7.392 (ref 7.350–7.450)
pO2, Arterial: 81.4 mmHg (ref 80.0–100.0)

## 2013-10-27 LAB — COMPREHENSIVE METABOLIC PANEL
ALT: 17 U/L (ref 0–35)
AST: 13 U/L (ref 0–37)
Albumin: 3.5 g/dL (ref 3.5–5.2)
Alkaline Phosphatase: 68 U/L (ref 39–117)
Anion gap: 15 (ref 5–15)
BUN: 14 mg/dL (ref 6–23)
CALCIUM: 9.4 mg/dL (ref 8.4–10.5)
CO2: 22 meq/L (ref 19–32)
Chloride: 98 mEq/L (ref 96–112)
Creatinine, Ser: 0.71 mg/dL (ref 0.50–1.10)
Glucose, Bld: 210 mg/dL — ABNORMAL HIGH (ref 70–99)
Potassium: 4.5 mEq/L (ref 3.7–5.3)
Sodium: 135 mEq/L — ABNORMAL LOW (ref 137–147)
Total Protein: 8 g/dL (ref 6.0–8.3)

## 2013-10-27 LAB — URINALYSIS, ROUTINE W REFLEX MICROSCOPIC
BILIRUBIN URINE: NEGATIVE
Glucose, UA: NEGATIVE mg/dL
HGB URINE DIPSTICK: NEGATIVE
Ketones, ur: NEGATIVE mg/dL
Leukocytes, UA: NEGATIVE
Nitrite: NEGATIVE
PROTEIN: NEGATIVE mg/dL
Specific Gravity, Urine: 1.009 (ref 1.005–1.030)
UROBILINOGEN UA: 0.2 mg/dL (ref 0.0–1.0)
pH: 6.5 (ref 5.0–8.0)

## 2013-10-27 LAB — SURGICAL PCR SCREEN
MRSA, PCR: NEGATIVE
STAPHYLOCOCCUS AUREUS: POSITIVE — AB

## 2013-10-27 LAB — PROTIME-INR
INR: 0.97 (ref 0.00–1.49)
PROTHROMBIN TIME: 12.9 s (ref 11.6–15.2)

## 2013-10-27 LAB — PREPARE RBC (CROSSMATCH)

## 2013-10-27 LAB — CBC
HCT: 39 % (ref 36.0–46.0)
HEMOGLOBIN: 13.1 g/dL (ref 12.0–15.0)
MCH: 30.3 pg (ref 26.0–34.0)
MCHC: 33.6 g/dL (ref 30.0–36.0)
MCV: 90.3 fL (ref 78.0–100.0)
PLATELETS: 290 10*3/uL (ref 150–400)
RBC: 4.32 MIL/uL (ref 3.87–5.11)
RDW: 14.3 % (ref 11.5–15.5)
WBC: 10.9 10*3/uL — ABNORMAL HIGH (ref 4.0–10.5)

## 2013-10-27 LAB — ABO/RH: ABO/RH(D): O POS

## 2013-10-27 LAB — APTT: APTT: 33 s (ref 24–37)

## 2013-10-27 NOTE — Pre-Procedure Instructions (Signed)
Leah Ferrell  10/27/2013   Your procedure is scheduled on:  Wednesday November 03, 2103 at 8:30 AM.  Report to [facility] Admitting at 6:30 AM.  Call this number if you have problems the morning of surgery: [phone]   Remember:   Do not eat food or drink liquids after midnight.   Take these medicines the morning of surgery with A SIP OF WATER: Albuterol inhaler if needed, Amlodipine (Norvasc), Eye drops if needed, Metoprolol (Toprol XL)    Do NOT take any diabetic medications the morning of your surgery   Do not wear jewelry, make-up or nail polish.  Do not wear lotions, powders, or perfumes.   Do not shave 48 hours prior to surgery.   Do not bring valuables to the hospital.  [facility] is not responsible for any belongings or valuables.               Contacts, dentures or bridgework may not be worn into surgery.  Leave suitcase in the car. After surgery it may be brought to your room.  For patients admitted to the hospital, discharge time is determined by your treatment team.               Patients discharged the day of surgery will not be allowed to drive home.  Name and phone number of your driver: Family/Friend  Special Instructions: Shower using CHG soap the night before and the morning of your surgery    Please read over the following fact sheets that you were given: Pain Booklet, Coughing and Deep Breathing, Blood Transfusion Information, MRSA Information and Surgical Site Infection Prevention

## 2013-10-27 NOTE — Progress Notes (Signed)
Patient denied having any acute chest or pulmonary issues. Patient informed Nurse that she has not taken a sublingual nitro in a long time, and has not used Albuterol inhaler in approximately 2 months. Patient had a cardiac cath and has 2 stents in the LAD according to the [facility] card she provided. Cardiologist is Dr. Fletcher [name]. Encounters in [place].

## 2013-10-27 NOTE — Progress Notes (Signed)
10/27/13 1309  OBSTRUCTIVE SLEEP APNEA  Have you ever been diagnosed with sleep apnea through a sleep study? No  Do you snore loudly (loud enough to be heard through closed doors)?  1  Do you often feel tired, fatigued, or sleepy during the daytime? 0  Has anyone observed you stop breathing during your sleep? 0  Do you have, or are you being treated for high blood pressure? 1  BMI more than 35 kg/m2? 1  Age over 62 years old? 1  Neck circumference greater than 40 cm/16 inches? 1  Gender: 0  Obstructive Sleep Apnea Score 5  Score 4 or greater  Results sent to PCP   This patient has screened at risk for sleep apnea using the STOP Bang tool during a pre-surgical visit. A score of 4 or greater is at risk for sleep apnea.

## 2013-10-28 ENCOUNTER — Other Ambulatory Visit: Payer: Self-pay | Admitting: Family

## 2013-10-28 DIAGNOSIS — R0683 Snoring: Secondary | ICD-10-CM

## 2013-10-28 NOTE — Progress Notes (Signed)
Anesthesia Chart Review:  Pt is 63 year old female scheduled for L femoral endarterectomy with vein patch angioplasty on 11/02/13 with Dr. Kellie Simmering.   PMH: L cardiac cath 07/21/2013 (DES LAD) (see care everywhere). Cardiac cath 2014 (DES LAD), CHF, MI 04/2012, CAD, HTN, PVD, diabetes. BMI 36. Former smoker.   Medications include: plavix, Pletal, metoprolol, lisinopril, lasix, nitroglycerin, amlodipine, aspirin, potassium, insulin aspart  Cardiolite 07/18/13 (see care everywhere): suggested a defect consistent with LAD distribution.   Cardiac angiography 07/21/13 (see care everywhere): Coronary Anatomy Stenosis Percentage (0% if not stated): -prox LAD - 70-80% -mid/distal LAD - patent stent Hemodynamic Pressures: LVEDP 14 mm Hg  2D echo 04/29/12: - Normal LV size with mild LV hypertrophy. EF 55-60%. Normal RV size and systolic function. No significant valvular abnormalities.  L cardiac cath 04/23/2012:  Final Conclusions:  1. Single vessel obstructive CAD  2. Normal LV function.  3. Successful stenting of the mid LAD with a DES.  L cardiac cath 04/27/2012 repeated for new episode chest pain: -Patent LAD stent, She has mild disease elsewhere.   Abdominal aortic angiogram 07/21/2012: 1. Significant heavily calcified ostial left common iliac artery stenosis with plaque extending into the distal aorta.  2. Severe heavily calcified disease in the left common femoral artery.  3. Bilateral SFA occlusion with heavy calcifications.  4. Significant below the knee disease bilaterally as outlined above.   Preoperative labs reviewed.  Blood glucose 210  Chest x-ray reviewed. Cardiac enlargement with mild vascular congestion.   EKG 10/03/13 reviewed. NSR.    Per Dr. Evelena Leyden note 10/18/13, pt is to continue her plavix perioperatively. Cardiologist Dr. Fletcher [name] recommended this surgery with Dr. Kellie Simmering and changed medication from Effient to Plavix and notes it cannot be stopped for surgery due to recent LAD  stent.   If no changes, I anticipate pt can proceed with surgery as scheduled.    Willeen Cass, FNP-BC [facility]/Anesthesiology Phone: [phone] 10/28/2013 2:23 PM

## 2013-10-31 ENCOUNTER — Telehealth: Payer: Self-pay | Admitting: Family

## 2013-10-31 MED ORDER — FUROSEMIDE 40 MG PO TABS: 40.0000 mg | ORAL_TABLET | Freq: Every day | ORAL | Status: DC

## 2013-10-31 MED ORDER — ATROPINE SULFATE 0.1 MG/ML IJ SOLN
INTRAMUSCULAR | Status: AC
  Filled 2013-10-31: qty 10

## 2013-10-31 NOTE — Telephone Encounter (Signed)
done

## 2013-11-01 MED ORDER — DEXTROSE 5 % IV SOLN
1.5000 g | INTRAVENOUS | Status: AC
  Administered 2013-11-02: 1.5 g via INTRAVENOUS
  Filled 2013-11-01: qty 1.5

## 2013-11-02 ENCOUNTER — Encounter (HOSPITAL_COMMUNITY)
Admission: RE | Disposition: A | Discharge status: Home / Self Care | Payer: Self-pay | Source: Ambulatory Visit | Attending: Vascular Surgery

## 2013-11-02 ENCOUNTER — Encounter (HOSPITAL_COMMUNITY): Payer: 59 | Admitting: Emergency Medicine

## 2013-11-02 ENCOUNTER — Inpatient Hospital Stay (HOSPITAL_COMMUNITY): Payer: 59 | Admitting: Anesthesiology

## 2013-11-02 ENCOUNTER — Encounter (HOSPITAL_COMMUNITY): Payer: Self-pay | Admitting: *Deleted

## 2013-11-02 ENCOUNTER — Inpatient Hospital Stay (HOSPITAL_COMMUNITY)
Admission: RE | Admit: 2013-11-02 | Discharge: 2013-11-05 | DRG: 253 | Disposition: A | Discharge status: Home / Self Care | Payer: 59 | Source: Ambulatory Visit | Attending: Vascular Surgery | Admitting: Vascular Surgery

## 2013-11-02 DIAGNOSIS — I5032 Chronic diastolic (congestive) heart failure: Secondary | ICD-10-CM | POA: Diagnosis present

## 2013-11-02 DIAGNOSIS — E785 Hyperlipidemia, unspecified: Secondary | ICD-10-CM | POA: Diagnosis present

## 2013-11-02 DIAGNOSIS — I252 Old myocardial infarction: Secondary | ICD-10-CM | POA: Diagnosis not present

## 2013-11-02 DIAGNOSIS — I70219 Atherosclerosis of native arteries of extremities with intermittent claudication, unspecified extremity: Principal | ICD-10-CM | POA: Diagnosis present

## 2013-11-02 DIAGNOSIS — I10 Essential (primary) hypertension: Secondary | ICD-10-CM | POA: Diagnosis present

## 2013-11-02 DIAGNOSIS — Z87891 Personal history of nicotine dependence: Secondary | ICD-10-CM | POA: Diagnosis not present

## 2013-11-02 DIAGNOSIS — I2510 Atherosclerotic heart disease of native coronary artery without angina pectoris: Secondary | ICD-10-CM | POA: Diagnosis present

## 2013-11-02 DIAGNOSIS — I743 Embolism and thrombosis of arteries of the lower extremities: Secondary | ICD-10-CM | POA: Diagnosis present

## 2013-11-02 DIAGNOSIS — Z7901 Long term (current) use of anticoagulants: Secondary | ICD-10-CM

## 2013-11-02 DIAGNOSIS — Z833 Family history of diabetes mellitus: Secondary | ICD-10-CM | POA: Diagnosis not present

## 2013-11-02 DIAGNOSIS — I739 Peripheral vascular disease, unspecified: Secondary | ICD-10-CM | POA: Diagnosis present

## 2013-11-02 DIAGNOSIS — Z8249 Family history of ischemic heart disease and other diseases of the circulatory system: Secondary | ICD-10-CM

## 2013-11-02 DIAGNOSIS — E109 Type 1 diabetes mellitus without complications: Secondary | ICD-10-CM | POA: Diagnosis present

## 2013-11-02 DIAGNOSIS — I509 Heart failure, unspecified: Secondary | ICD-10-CM | POA: Diagnosis present

## 2013-11-02 DIAGNOSIS — Z9861 Coronary angioplasty status: Secondary | ICD-10-CM | POA: Diagnosis not present

## 2013-11-02 DIAGNOSIS — Z6839 Body mass index (BMI) 39.0-39.9, adult: Secondary | ICD-10-CM | POA: Diagnosis not present

## 2013-11-02 DIAGNOSIS — Z794 Long term (current) use of insulin: Secondary | ICD-10-CM

## 2013-11-02 HISTORY — PX: ENDARTERECTOMY FEMORAL: SHX5804

## 2013-11-02 LAB — GLUCOSE, CAPILLARY
Glucose-Capillary: 117 mg/dL — ABNORMAL HIGH (ref 70–99)
Glucose-Capillary: 148 mg/dL — ABNORMAL HIGH (ref 70–99)
Glucose-Capillary: 251 mg/dL — ABNORMAL HIGH (ref 70–99)
Glucose-Capillary: 269 mg/dL — ABNORMAL HIGH (ref 70–99)
Glucose-Capillary: 227 mg/dL — ABNORMAL HIGH (ref 70–99)

## 2013-11-02 SURGERY — ENDARTERECTOMY, FEMORAL
Anesthesia: General | Site: Groin | Laterality: Left

## 2013-11-02 MED ORDER — LABETALOL HCL 5 MG/ML IV SOLN
10.0000 mg | INTRAVENOUS | Status: DC | PRN
  Filled 2013-11-02: qty 4

## 2013-11-02 MED ORDER — OMEGA-3-ACID ETHYL ESTERS 1 G PO CAPS
1.0000 g | ORAL_CAPSULE | Freq: Every day | ORAL | Status: DC
  Administered 2013-11-03 – 2013-11-05 (×3): 1 g via ORAL
  Filled 2013-11-02 (×3): qty 1

## 2013-11-02 MED ORDER — EPHEDRINE SULFATE 50 MG/ML IJ SOLN
INTRAMUSCULAR | Status: AC
  Filled 2013-11-02: qty 1

## 2013-11-02 MED ORDER — PROPOFOL 10 MG/ML IV BOLUS
INTRAVENOUS | Status: DC | PRN
  Administered 2013-11-02: 150 mg via INTRAVENOUS
  Administered 2013-11-02: 50 mg via INTRAVENOUS

## 2013-11-02 MED ORDER — OXYCODONE HCL 5 MG/5ML PO SOLN: 5.0000 mg | Freq: Once | ORAL | Status: DC | PRN

## 2013-11-02 MED ORDER — AMLODIPINE BESYLATE 10 MG PO TABS
10.0000 mg | ORAL_TABLET | Freq: Every day | ORAL | Status: DC
  Administered 2013-11-03 – 2013-11-05 (×3): 10 mg via ORAL
  Filled 2013-11-02 (×4): qty 1

## 2013-11-02 MED ORDER — ONDANSETRON HCL 4 MG/2ML IJ SOLN
4.0000 mg | Freq: Four times a day (QID) | INTRAMUSCULAR | Status: DC | PRN
  Administered 2013-11-02: 4 mg via INTRAVENOUS
  Filled 2013-11-02: qty 2

## 2013-11-02 MED ORDER — PHENYLEPHRINE 40 MCG/ML (10ML) SYRINGE FOR IV PUSH (FOR BLOOD PRESSURE SUPPORT)
PREFILLED_SYRINGE | INTRAVENOUS | Status: AC
  Filled 2013-11-02: qty 10

## 2013-11-02 MED ORDER — HYDROMORPHONE HCL 1 MG/ML IJ SOLN
INTRAMUSCULAR | Status: AC
  Administered 2013-11-02: 0.5 mg via INTRAVENOUS
  Filled 2013-11-02: qty 1

## 2013-11-02 MED ORDER — ROCURONIUM BROMIDE 50 MG/5ML IV SOLN
INTRAVENOUS | Status: AC
  Filled 2013-11-02: qty 1

## 2013-11-02 MED ORDER — PROPOFOL 10 MG/ML IV BOLUS
INTRAVENOUS | Status: AC
  Filled 2013-11-02: qty 20

## 2013-11-02 MED ORDER — SCOPOLAMINE 1 MG/3DAYS TD PT72
MEDICATED_PATCH | TRANSDERMAL | Status: AC
  Administered 2013-11-02: 1 via TRANSDERMAL
  Filled 2013-11-02: qty 1

## 2013-11-02 MED ORDER — SIMVASTATIN 40 MG PO TABS: 40.0000 mg | ORAL_TABLET | Freq: Every day | ORAL | Status: DC

## 2013-11-02 MED ORDER — MIDAZOLAM HCL 2 MG/2ML IJ SOLN
INTRAMUSCULAR | Status: AC
  Filled 2013-11-02: qty 2

## 2013-11-02 MED ORDER — PANTOPRAZOLE SODIUM 40 MG PO TBEC
40.0000 mg | DELAYED_RELEASE_TABLET | Freq: Every day | ORAL | Status: DC
  Administered 2013-11-03 – 2013-11-05 (×3): 40 mg via ORAL
  Filled 2013-11-02 (×2): qty 1

## 2013-11-02 MED ORDER — FUROSEMIDE 40 MG PO TABS
40.0000 mg | ORAL_TABLET | Freq: Every day | ORAL | Status: DC
  Administered 2013-11-03 – 2013-11-05 (×3): 40 mg via ORAL
  Filled 2013-11-02 (×4): qty 1

## 2013-11-02 MED ORDER — POTASSIUM CHLORIDE CRYS ER 20 MEQ PO TBCR: 20.0000 meq | EXTENDED_RELEASE_TABLET | Freq: Every day | ORAL | Status: DC | PRN

## 2013-11-02 MED ORDER — PROMETHAZINE HCL 25 MG/ML IJ SOLN: 6.2500 mg | INTRAMUSCULAR | Status: DC | PRN

## 2013-11-02 MED ORDER — POLYVINYL ALCOHOL 1.4 % OP SOLN
1.0000 [drp] | OPHTHALMIC | Status: DC | PRN
  Filled 2013-11-02: qty 15

## 2013-11-02 MED ORDER — LISINOPRIL 20 MG PO TABS
20.0000 mg | ORAL_TABLET | Freq: Two times a day (BID) | ORAL | Status: DC
  Administered 2013-11-02 – 2013-11-05 (×6): 20 mg via ORAL
  Filled 2013-11-02 (×8): qty 1

## 2013-11-02 MED ORDER — METOPROLOL SUCCINATE ER 50 MG PO TB24
75.0000 mg | ORAL_TABLET | Freq: Two times a day (BID) | ORAL | Status: DC
  Administered 2013-11-02 – 2013-11-05 (×6): 75 mg via ORAL
  Filled 2013-11-02 (×8): qty 1

## 2013-11-02 MED ORDER — CLOPIDOGREL BISULFATE 75 MG PO TABS
75.0000 mg | ORAL_TABLET | Freq: Every day | ORAL | Status: DC
  Administered 2013-11-03 – 2013-11-05 (×3): 75 mg via ORAL
  Filled 2013-11-02 (×3): qty 1

## 2013-11-02 MED ORDER — OXYCODONE HCL 5 MG PO TABS: 5.0000 mg | ORAL_TABLET | Freq: Once | ORAL | Status: DC | PRN

## 2013-11-02 MED ORDER — LIDOCAINE HCL (CARDIAC) 20 MG/ML IV SOLN
INTRAVENOUS | Status: DC | PRN
  Administered 2013-11-02: 100 mg via INTRAVENOUS

## 2013-11-02 MED ORDER — COLESEVELAM HCL 625 MG PO TABS
625.0000 mg | ORAL_TABLET | Freq: Two times a day (BID) | ORAL | Status: DC
  Administered 2013-11-03 – 2013-11-05 (×5): 625 mg via ORAL
  Filled 2013-11-02 (×9): qty 1

## 2013-11-02 MED ORDER — PHENOL 1.4 % MT LIQD
1.0000 | OROMUCOSAL | Status: DC | PRN
  Administered 2013-11-02: 1 via OROMUCOSAL
  Filled 2013-11-02 (×2): qty 177

## 2013-11-02 MED ORDER — SODIUM CHLORIDE 0.9 % IV SOLN: INTRAVENOUS | Status: DC

## 2013-11-02 MED ORDER — SODIUM CHLORIDE 0.9 % IV SOLN: 500.0000 mL | Freq: Once | INTRAVENOUS | Status: AC | PRN

## 2013-11-02 MED ORDER — GLYCOPYRROLATE 0.2 MG/ML IJ SOLN
INTRAMUSCULAR | Status: DC | PRN
  Administered 2013-11-02: 0.4 mg via INTRAVENOUS

## 2013-11-02 MED ORDER — FENTANYL CITRATE 0.05 MG/ML IJ SOLN
INTRAMUSCULAR | Status: DC | PRN
  Administered 2013-11-02: 100 ug via INTRAVENOUS
  Administered 2013-11-02: 25 ug via INTRAVENOUS

## 2013-11-02 MED ORDER — ACETAMINOPHEN 650 MG RE SUPP: 325.0000 mg | RECTAL | Status: DC | PRN

## 2013-11-02 MED ORDER — HYDRALAZINE HCL 20 MG/ML IJ SOLN: 10.0000 mg | INTRAMUSCULAR | Status: DC | PRN

## 2013-11-02 MED ORDER — PHENYLEPHRINE HCL 10 MG/ML IJ SOLN
10.0000 mg | INTRAVENOUS | Status: DC | PRN
  Administered 2013-11-02: 30 ug/min via INTRAVENOUS

## 2013-11-02 MED ORDER — DEXTROSE 5 % IV SOLN
1.5000 g | Freq: Two times a day (BID) | INTRAVENOUS | Status: AC
  Administered 2013-11-02 – 2013-11-03 (×2): 1.5 g via INTRAVENOUS
  Filled 2013-11-02 (×2): qty 1.5

## 2013-11-02 MED ORDER — PRAVASTATIN SODIUM 40 MG PO TABS
80.0000 mg | ORAL_TABLET | Freq: Every day | ORAL | Status: DC
  Administered 2013-11-03 – 2013-11-04 (×2): 80 mg via ORAL
  Filled 2013-11-02 (×5): qty 2

## 2013-11-02 MED ORDER — SUCCINYLCHOLINE CHLORIDE 20 MG/ML IJ SOLN
INTRAMUSCULAR | Status: AC
  Filled 2013-11-02: qty 1

## 2013-11-02 MED ORDER — DEXAMETHASONE SODIUM PHOSPHATE 4 MG/ML IJ SOLN
INTRAMUSCULAR | Status: DC | PRN
  Administered 2013-11-02: 4 mg via INTRAVENOUS

## 2013-11-02 MED ORDER — ASPIRIN EC 81 MG PO TBEC
81.0000 mg | DELAYED_RELEASE_TABLET | Freq: Every day | ORAL | Status: DC
  Administered 2013-11-03 – 2013-11-05 (×3): 81 mg via ORAL
  Filled 2013-11-02 (×3): qty 1

## 2013-11-02 MED ORDER — LIDOCAINE HCL (CARDIAC) 20 MG/ML IV SOLN
INTRAVENOUS | Status: AC
  Filled 2013-11-02: qty 5

## 2013-11-02 MED ORDER — PHENYLEPHRINE HCL 10 MG/ML IJ SOLN
INTRAMUSCULAR | Status: DC | PRN
  Administered 2013-11-02: 80 ug via INTRAVENOUS
  Administered 2013-11-02: 120 ug via INTRAVENOUS

## 2013-11-02 MED ORDER — HYDROMORPHONE HCL PF 1 MG/ML IJ SOLN
INTRAMUSCULAR | Status: AC
  Administered 2013-11-02: 0.5 mg via INTRAVENOUS
  Filled 2013-11-02: qty 1

## 2013-11-02 MED ORDER — LACTATED RINGERS IV SOLN
INTRAVENOUS | Status: DC | PRN
  Administered 2013-11-02 (×2): via INTRAVENOUS

## 2013-11-02 MED ORDER — OMEGA-3 FATTY ACIDS 1000 MG PO CAPS: 1.0000 g | ORAL_CAPSULE | Freq: Every morning | ORAL | Status: DC

## 2013-11-02 MED ORDER — HEPARIN SODIUM (PORCINE) 1000 UNIT/ML IJ SOLN
INTRAMUSCULAR | Status: DC | PRN
  Administered 2013-11-02: 6000 [IU] via INTRAVENOUS

## 2013-11-02 MED ORDER — CHLORHEXIDINE GLUCONATE CLOTH 2 % EX PADS: 6.0000 | MEDICATED_PAD | Freq: Once | CUTANEOUS | Status: DC

## 2013-11-02 MED ORDER — SODIUM CHLORIDE 0.9 % IJ SOLN
INTRAMUSCULAR | Status: AC
  Filled 2013-11-02: qty 10

## 2013-11-02 MED ORDER — ONDANSETRON HCL 4 MG/2ML IJ SOLN
INTRAMUSCULAR | Status: DC | PRN
  Administered 2013-11-02: 4 mg via INTRAVENOUS

## 2013-11-02 MED ORDER — INSULIN ASPART 100 UNIT/ML ~~LOC~~ SOLN
0.0000 [IU] | Freq: Three times a day (TID) | SUBCUTANEOUS | Status: DC
  Administered 2013-11-02 – 2013-11-03 (×2): 8 [IU] via SUBCUTANEOUS
  Administered 2013-11-03: 5 [IU] via SUBCUTANEOUS
  Administered 2013-11-03: 8 [IU] via SUBCUTANEOUS
  Administered 2013-11-04: 11 [IU] via SUBCUTANEOUS
  Administered 2013-11-04: 8 [IU] via SUBCUTANEOUS
  Administered 2013-11-04: 5 [IU] via SUBCUTANEOUS
  Administered 2013-11-05 (×2): 8 [IU] via SUBCUTANEOUS

## 2013-11-02 MED ORDER — OXYCODONE HCL 5 MG PO TABS
5.0000 mg | ORAL_TABLET | ORAL | Status: DC | PRN
  Administered 2013-11-02 – 2013-11-05 (×6): 5 mg via ORAL
  Filled 2013-11-02 (×6): qty 1

## 2013-11-02 MED ORDER — DOCUSATE SODIUM 100 MG PO CAPS
100.0000 mg | ORAL_CAPSULE | Freq: Every day | ORAL | Status: DC
  Administered 2013-11-05: 100 mg via ORAL
  Filled 2013-11-02 (×2): qty 1

## 2013-11-02 MED ORDER — DOPAMINE-DEXTROSE 3.2-5 MG/ML-% IV SOLN: 3.0000 ug/kg/min | INTRAVENOUS | Status: DC

## 2013-11-02 MED ORDER — FENTANYL CITRATE 0.05 MG/ML IJ SOLN
INTRAMUSCULAR | Status: AC
  Filled 2013-11-02: qty 5

## 2013-11-02 MED ORDER — NAPHAZOLINE HCL 0.1 % OP SOLN: 1.0000 [drp] | Freq: Four times a day (QID) | OPHTHALMIC | Status: DC | PRN

## 2013-11-02 MED ORDER — SODIUM CHLORIDE 0.9 % IR SOLN
Status: DC | PRN
  Administered 2013-11-02: 09:00:00

## 2013-11-02 MED ORDER — POTASSIUM CHLORIDE CRYS ER 20 MEQ PO TBCR
20.0000 meq | EXTENDED_RELEASE_TABLET | Freq: Every day | ORAL | Status: DC
  Administered 2013-11-03 – 2013-11-05 (×3): 20 meq via ORAL
  Filled 2013-11-02 (×3): qty 1

## 2013-11-02 MED ORDER — GUAIFENESIN-DM 100-10 MG/5ML PO SYRP: 15.0000 mL | ORAL_SOLUTION | ORAL | Status: DC | PRN

## 2013-11-02 MED ORDER — ADULT MULTIVITAMIN W/MINERALS CH
1.0000 | ORAL_TABLET | Freq: Every day | ORAL | Status: DC
  Administered 2013-11-03 – 2013-11-05 (×3): 1 via ORAL
  Filled 2013-11-02 (×4): qty 1

## 2013-11-02 MED ORDER — HYPROMELL-GLYCERIN-NAPHAZOLINE 0.8-0.25-0.012 % OP SOLN: 1.0000 "application " | Freq: Every day | OPHTHALMIC | Status: DC | PRN

## 2013-11-02 MED ORDER — ALBUTEROL SULFATE (2.5 MG/3ML) 0.083% IN NEBU: 2.5000 mg | INHALATION_SOLUTION | Freq: Four times a day (QID) | RESPIRATORY_TRACT | Status: DC | PRN

## 2013-11-02 MED ORDER — MONTELUKAST SODIUM 10 MG PO TABS
10.0000 mg | ORAL_TABLET | Freq: Every day | ORAL | Status: DC
  Administered 2013-11-02 – 2013-11-04 (×3): 10 mg via ORAL
  Filled 2013-11-02 (×4): qty 1

## 2013-11-02 MED ORDER — PROTAMINE SULFATE 10 MG/ML IV SOLN
INTRAVENOUS | Status: DC | PRN
  Administered 2013-11-02: 10 mg via INTRAVENOUS
  Administered 2013-11-02 (×2): 20 mg via INTRAVENOUS

## 2013-11-02 MED ORDER — NEOSTIGMINE METHYLSULFATE 10 MG/10ML IV SOLN
INTRAVENOUS | Status: DC | PRN
  Administered 2013-11-02: 3 mg via INTRAVENOUS

## 2013-11-02 MED ORDER — HEPARIN SODIUM (PORCINE) 1000 UNIT/ML IJ SOLN
INTRAMUSCULAR | Status: AC
  Filled 2013-11-02: qty 1

## 2013-11-02 MED ORDER — MORPHINE SULFATE 2 MG/ML IJ SOLN: 2.0000 mg | INTRAMUSCULAR | Status: DC | PRN

## 2013-11-02 MED ORDER — MIDAZOLAM HCL 5 MG/5ML IJ SOLN
INTRAMUSCULAR | Status: DC | PRN
  Administered 2013-11-02: 2 mg via INTRAVENOUS

## 2013-11-02 MED ORDER — NITROGLYCERIN 0.4 MG SL SUBL: 0.4000 mg | SUBLINGUAL_TABLET | SUBLINGUAL | Status: DC | PRN

## 2013-11-02 MED ORDER — HYPROMELLOSE (GONIOSCOPIC) 2.5 % OP SOLN: 1.0000 [drp] | Freq: Three times a day (TID) | OPHTHALMIC | Status: DC | PRN

## 2013-11-02 MED ORDER — HYDROMORPHONE HCL PF 1 MG/ML IJ SOLN
0.2500 mg | INTRAMUSCULAR | Status: DC | PRN
  Administered 2013-11-02 (×4): 0.5 mg via INTRAVENOUS

## 2013-11-02 MED ORDER — METOPROLOL TARTRATE 1 MG/ML IV SOLN: 2.0000 mg | INTRAVENOUS | Status: DC | PRN

## 2013-11-02 MED ORDER — ACETAMINOPHEN 325 MG PO TABS: 325.0000 mg | ORAL_TABLET | ORAL | Status: DC | PRN

## 2013-11-02 MED ORDER — 0.9 % SODIUM CHLORIDE (POUR BTL) OPTIME
TOPICAL | Status: DC | PRN
  Administered 2013-11-02: 2000 mL

## 2013-11-02 MED ORDER — ROCURONIUM BROMIDE 100 MG/10ML IV SOLN
INTRAVENOUS | Status: DC | PRN
  Administered 2013-11-02: 50 mg via INTRAVENOUS

## 2013-11-02 MED ORDER — ALUM & MAG HYDROXIDE-SIMETH 200-200-20 MG/5ML PO SUSP: 15.0000 mL | ORAL | Status: DC | PRN

## 2013-11-02 SURGICAL SUPPLY — 57 items
ADH SKN CLS APL DERMABOND .7 (GAUZE/BANDAGES/DRESSINGS) ×1
BANDAGE ESMARK 6X9 LF (GAUZE/BANDAGES/DRESSINGS) IMPLANT
BLADE SURG 10 STRL SS (BLADE) ×1 IMPLANT
BLADE SURG 15 STRL LF DISP TIS (BLADE) IMPLANT
BLADE SURG 15 STRL SS (BLADE) ×2
BLADE SURG CLIPPER 3M 9600 (MISCELLANEOUS) ×1 IMPLANT
BNDG CMPR 9X6 STRL LF SNTH (GAUZE/BANDAGES/DRESSINGS)
BNDG ESMARK 6X9 LF (GAUZE/BANDAGES/DRESSINGS)
CANISTER SUCTION 2500CC (MISCELLANEOUS) ×2 IMPLANT
CLIP TI MEDIUM 24 (CLIP) ×2 IMPLANT
CLIP TI WIDE RED SMALL 24 (CLIP) ×2 IMPLANT
DERMABOND ADVANCED (GAUZE/BANDAGES/DRESSINGS) ×1
DERMABOND ADVANCED .7 DNX12 (GAUZE/BANDAGES/DRESSINGS) ×1 IMPLANT
DRAIN SNY 10X20 3/4 PERF (WOUND CARE) IMPLANT
DRSG COVADERM 4X8 (GAUZE/BANDAGES/DRESSINGS) ×1 IMPLANT
ELECT REM PT RETURN 9FT ADLT (ELECTROSURGICAL) ×2
ELECTRODE REM PT RTRN 9FT ADLT (ELECTROSURGICAL) ×1 IMPLANT
EVACUATOR SILICONE 100CC (DRAIN) IMPLANT
GLOVE BIO SURGEON STRL SZ 6.5 (GLOVE) ×2 IMPLANT
GLOVE BIO SURGEON STRL SZ7.5 (GLOVE) ×1 IMPLANT
GLOVE BIOGEL PI IND STRL 7.0 (GLOVE) IMPLANT
GLOVE BIOGEL PI IND STRL 7.5 (GLOVE) IMPLANT
GLOVE BIOGEL PI INDICATOR 7.0 (GLOVE) ×1
GLOVE BIOGEL PI INDICATOR 7.5 (GLOVE) ×1
GLOVE SS BIOGEL STRL SZ 7 (GLOVE) ×1 IMPLANT
GLOVE SUPERSENSE BIOGEL SZ 7 (GLOVE) ×1
GLOVE SURG SS PI 7.0 STRL IVOR (GLOVE) ×2 IMPLANT
GOWN STRL REUS W/ TWL LRG LVL3 (GOWN DISPOSABLE) ×3 IMPLANT
GOWN STRL REUS W/ TWL XL LVL3 (GOWN DISPOSABLE) IMPLANT
GOWN STRL REUS W/TWL LRG LVL3 (GOWN DISPOSABLE) ×6
GOWN STRL REUS W/TWL XL LVL3 (GOWN DISPOSABLE) ×2
GRAFT CV STRG 30X7KNIT SFT (Vascular Products) IMPLANT
GRAFT HEMASHIELD 7MM (Vascular Products) ×2 IMPLANT
HEMASHIELD FINESSE CARDIO (Vascular Products) IMPLANT
KIT BASIN OR (CUSTOM PROCEDURE TRAY) ×2 IMPLANT
KIT ROOM TURNOVER OR (KITS) ×2 IMPLANT
NS IRRIG 1000ML POUR BTL (IV SOLUTION) ×4 IMPLANT
PACK PERIPHERAL VASCULAR (CUSTOM PROCEDURE TRAY) ×2 IMPLANT
PAD ARMBOARD 7.5X6 YLW CONV (MISCELLANEOUS) ×4 IMPLANT
PADDING CAST COTTON 6X4 STRL (CAST SUPPLIES) IMPLANT
PATCH HEMASHIELD FINESS CARDIO (Vascular Products) IMPLANT
PROBE PENCIL 8 MHZ STRL DISP (MISCELLANEOUS) ×1 IMPLANT
SPONGE INTESTINAL PEANUT (DISPOSABLE) ×1 IMPLANT
STAPLER VISISTAT 35W (STAPLE) ×1 IMPLANT
SUT PROLENE 5 0 C 1 24 (SUTURE) ×2 IMPLANT
SUT PROLENE 6 0 BV (SUTURE) ×1 IMPLANT
SUT PROLENE 6 0 C 1 24 (SUTURE) ×1 IMPLANT
SUT PROLENE 6 0 C 1 30 (SUTURE) ×1 IMPLANT
SUT VIC AB 2-0 CT1 18 (SUTURE) ×1 IMPLANT
SUT VIC AB 2-0 CTX 36 (SUTURE) ×2 IMPLANT
SUT VIC AB 3-0 SH 27 (SUTURE) ×2
SUT VIC AB 3-0 SH 27X BRD (SUTURE) ×1 IMPLANT
SUT VIC AB 3-0 SH 8-18 (SUTURE) ×1 IMPLANT
TOWEL OR 17X24 6PK STRL BLUE (TOWEL DISPOSABLE) ×2 IMPLANT
TRAY FOLEY CATH 16FRSI W/METER (SET/KITS/TRAYS/PACK) IMPLANT
UNDERPAD 30X30 INCONTINENT (UNDERPADS AND DIAPERS) ×2 IMPLANT
WATER STERILE IRR 1000ML POUR (IV SOLUTION) ×2 IMPLANT

## 2013-11-02 NOTE — Progress Notes (Signed)
Correction : note previously on Lt foot not RT

## 2013-11-02 NOTE — Anesthesia Procedure Notes (Signed)
Procedure Name: Intubation Date/Time: 11/02/2013 8:37 AM Performed by: Julian Reil Pre-anesthesia Checklist: Patient identified, Emergency Drugs available, Suction available and Patient being monitored Patient Re-evaluated:Patient Re-evaluated prior to inductionOxygen Delivery Method: Circle system utilized Preoxygenation: Pre-oxygenation with 100% oxygen Intubation Type: IV induction Ventilation: Mask ventilation without difficulty and Oral airway inserted - appropriate to patient size Laryngoscope Size: Mac and 3 Grade View: Grade II Tube type: Oral Tube size: 7.5 mm Number of attempts: 1 Airway Equipment and Method: Stylet Placement Confirmation: ETT inserted through vocal cords under direct vision,  positive ETCO2 and breath sounds checked- equal and bilateral Secured at: 22 cm Tube secured with: Tape Dental Injury: Teeth and Oropharynx as per pre-operative assessment

## 2013-11-02 NOTE — H&P (View-Only) (Signed)
Subjective:     Patient ID: Leah Ferrell, female   DOB: [birth year], 63 y.o.   MRN: [id number]  HPI this 63 year old female was referred by Dr. Fletcher [name] for evaluation of severe claudication left leg. Patient had angiography which revealed a left common iliac stenosis which was treated with stenting. 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 She also has occlusion of the mid left SFA. She has a patent popliteal artery with three-vessel off. She is only able to ambulate 50 feet before stopping. She must use a cart to shop. She has no rest pain or history of nonhealing ulcers infection or gangrene. She does have type 1 diabetes mellitus. She quit smoking 10 years ago. She had a cardiac stent placed several months ago and is on Plavix.  Past Medical History  Diagnosis Date  . Hypertension   . Diabetes mellitus without complication   . Hyperlipidemia   . CAD (coronary artery disease)     a. 04/2012 NSTEMI: s/p DES to LAD.  Marland Kitchen PVD (peripheral vascular disease)     a. 04/2012 ABI: R 0.49, L 0.39. Angiography 06/14: Significant ostial left common iliac artery stenosis extending into the distal aorta, severe left common femoral artery stenosis, bilateral SFA occlusion with heavy calcifications. Functionally, one-vessel runoff bilaterally below the knee  . Chronic diastolic CHF (congestive heart failure)   . Leukocytosis   . Morbid obesity   . NSTEMI (non-ST elevated myocardial infarction) 04/27/2012  . Dysrhythmia   . Shortness of breath     History  Substance Use Topics  . Smoking status: Former Smoker    Quit date: 02/17/2001  . Smokeless tobacco: Former Systems developer    Quit date: 04/20/2001  . Alcohol Use: No    Family History  Problem Relation Age of Onset  . Diabetes Mother   . Hyperlipidemia Mother   . Hypertension Mother   . Peripheral vascular disease Mother     Allergies  Allergen Reactions  . Tape Rash    Current outpatient prescriptions:albuterol (PROVENTIL  HFA;VENTOLIN HFA) 108 (90 BASE) MCG/ACT inhaler, Inhale 1-2 puffs into the lungs every 6 (six) hours as needed for wheezing or shortness of breath., Disp: 9 Inhaler, Rfl: 0;  amLODipine (NORVASC) 10 MG tablet, Take 1 tablet (10 mg total) by mouth daily., Disp: 30 tablet, Rfl: 6;  Ascorbic Acid (VITAMIN C) 500 MG CAPS, Take 1 capsule by mouth daily. , Disp: , Rfl:  aspirin EC 81 MG tablet, Take 81 mg by mouth 2 (two) times daily., Disp: , Rfl: ;  Calcium Carbonate-Vit D-Min (CALTRATE 600+D PLUS MINERALS) 600-800 MG-UNIT TABS, Take 1 tablet by mouth 2 (two) times daily., Disp: , Rfl: ;  clopidogrel (PLAVIX) 75 MG tablet, Take 1 tablet (75 mg total) by mouth daily., Disp: 90 tablet, Rfl: 3;  fish oil-omega-3 fatty acids 1000 MG capsule, Take 1 g by mouth every morning., Disp: , Rfl:  furosemide (LASIX) 40 MG tablet, Take 40 mg by mouth daily., Disp: , Rfl: ;  hydroxypropyl methylcellulose (ISOPTO TEARS) 2.5 % ophthalmic solution, Place 1 drop into both eyes 3 (three) times daily as needed (for dry eyes)., Disp: , Rfl: ;  Hypromell-Glycerin-Naphazoline 0.8-0.25-0.012 % SOLN, Apply 1 application to eye daily as needed. , Disp: , Rfl:  Insulin Pen Needle (PEN NEEDLES 29GX1/2") 29G X 12MM MISC, 1 Container by Does not apply route 3 (three) times daily., Disp: 50 each, Rfl: 11;  lisinopril (PRINIVIL,ZESTRIL) 20 MG tablet, Take 1 tablet (20 mg total) by  mouth 2 (two) times daily., Disp: 60 tablet, Rfl: 6;  metoprolol succinate (TOPROL-XL) 25 MG 24 hr tablet, Take 75 mg by mouth 2 (two) times daily., Disp: , Rfl:  metoprolol succinate (TOPROL-XL) 50 MG 24 hr tablet, Take one and one-half tablets by mouth twice daily, Disp: 120 tablet, Rfl: 0;  montelukast (SINGULAIR) 10 MG tablet, Take 1 tablet (10 mg total) by mouth at bedtime., Disp: 30 tablet, Rfl: 3;  Multiple Vitamin (MULTIVITAMIN WITH MINERALS) TABS, Take 1 tablet by mouth daily., Disp: , Rfl:  nitroGLYCERIN (NITROSTAT) 0.4 MG SL tablet, Place 1 tablet (0.4 mg  total) under the tongue every 5 (five) minutes as needed for chest pain (up to 3 doses)., Disp: 25 tablet, Rfl: 4;  NOVOLOG MIX 70/30 FLEXPEN (70-30) 100 UNIT/ML Pen, INJECT 98 UNITS SUBCUTANEOUSLY THREE TIMES DAILY, Disp: 90 mL, Rfl: 2;  potassium chloride SA (K-DUR,KLOR-CON) 20 MEQ tablet, Take 20 mEq by mouth daily., Disp: , Rfl:  pravastatin (PRAVACHOL) 80 MG tablet, Take 1 tablet (80 mg total) by mouth at bedtime., Disp: 30 tablet, Rfl: 3;  WELCHOL 625 MG tablet, TAKE THREE TABLETS BY MOUTH TWICE DAILY WITH  A  MEAL, Disp: 180 tablet, Rfl: 1  BP 155/63  Pulse 84  Ht 5' 2.5" (1.588 m)  Wt 202 lb 9.6 oz (91.899 kg)  BMI 36.44 kg/m2  SpO2 97%  Body mass index is 36.44 kg/(m^2).          Review of Systems denies active chest pain but does have a history of occasional chest discomfort palpitations dyspnea on exertion orthopnea. She has severe low leg discomfort with ambulation history of swelling in both lower extremities numbness in the arms and legs. All systems negative otherwise and a complete review of systems     Objective:   Physical Exam BP 155/63  Pulse 84  Ht 5' 2.5" (1.588 m)  Wt 202 lb 9.6 oz (91.899 kg)  BMI 36.44 kg/m2  SpO2 97%  Gen.-alert and oriented x3 in no apparent distress-obese HEENT normal for age Lungs no rhonchi or wheezing Cardiovascular regular rhythm no murmurs carotid pulses 3+ palpable no bruits audible Abdomen soft nontender no palpable masses Musculoskeletal free of  major deformities Skin clear -no rashes Neurologic normal Lower extremities  right leg with 3+ femoral pulse no palpable distal pulses. Left leg with 1-2+ femoral pulse no palpable distal pulses. No evidence of ischemia gangrene or infection in either lower treatment.  I have reviewed the angiograms performed by Dr.Arida and also have reviewed the ABIs which was 0.17 on the left prior to balloon angioplasty of the left common iliac artery.        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 Discusses with patient and her daughter and they would like to proceed on Wednesday, September 16. She is obese and I have discussed potential wound issues in the left inguinal region with the patient and her daughter. Will continue Plavix throughout perioperative period

## 2013-11-02 NOTE — Interval H&P Note (Signed)
History and Physical Interval Note:  11/02/2013 8:23 AM  Leah Ferrell  has presented today for surgery, with the diagnosis of Atherosclerosis of native arteries of the extremities with intermittent claudication   The various methods of treatment have been discussed with the patient and family. After consideration of risks, benefits and other options for treatment, the patient has consented to  Procedure(s): ENDARTERECTOMY FEMORAL WITH VEIN PATCH ANGIOPLASTY (Left) as a surgical intervention .  The patient's history has been reviewed, patient examined, no change in status, stable for surgery.  I have reviewed the patient's chart and labs.  Questions were answered to the patient's satisfaction.     [name]

## 2013-11-02 NOTE — Op Note (Addendum)
OPERATIVE REPORT  Date of Surgery: 11/02/2013  Surgeon: [name], MD  Assistant: Dr. Juanda Crumble fields, Aldona Bar Rhyne,PA  Pre-op Diagnosis: Near total occlusion left external iliac and common femoral artery with severe claudication left leg with occasional rest pain  Post-op Diagnosis: Same  Procedure: Procedure(s): RESECTION LEFT COMMON FEMORAL ARTERY AND INSERTION OF INTERPOSITION 15mm HEMASHIELD GRAFT FROM LEFT EXTERNAL  ILIAC ARTERY TO LEFT PROFUNDA  FEMORIS ARTERY  Anesthesia: Gen. endotracheal  EBL: Minimal  Complications: None  Procedure Details: Patient in the operating room placed in supine position and satisfactory general endotracheal anesthesia was administered. The left inguinal area and proximal thigh were prepped with Betadine scrub and solution draped in routine sterile manner. A longitudinal incision made in the left inguinal area carried down through subcutaneous tissue with scalpel. The common superficial and profunda femoris arteries were dissected free being careful to preserve the saphenous vein. There was diffuse calcification throughout all of the vessels. Superficial femoral artery was known to be occluded in the mid thigh and it was heavily calcified as was the common femoral artery. The profunda became softer down past its secondary branches which were also dissected free. External iliac was exposed well beneath the inguinal ligament were in a good pulse the posterior plaque. Patient was heparinized. Vessels were all occluded with vascular clamps a longitudinal opening made in the common femoral artery a 15 blade. The vessel was essentially totally occluded having a very slight 1. Arthrotomy was extended down into the profunda until the plaque terminated. Was also extended approximately anything the ligament to clamp. Superficial femoral artery was essentially totally occluded and was ligated at its origin. Endarterectomy was performed from the clamp down to and  including the profunda. The vessel was so thin on the posterior wall after completing the endarterectomy there was concern about the origin of the profunda were the artery was beginning to tear slightly. It was decided to replace this entire segment with a Hemashield background graft-7 mm. Therefore the common femoral was resected and the proximal external iliac was spatulated as was the profunda and the profunda was  transected. A 7 mm graft was spatulated and anastomosed end into the external iliac with 5-0 Prolene in end into the profunda with 6-0 Prolene. Following appropriate flushing was completed clamps released there was an excellent pulse and excellent Doppler flow in the profunda. Hemostasis was achieved following protamine wounds were closed in layers with Vicryl in an interrupted fashion with skin staples used for the skin and patient taken to recovery in stable condition [name], MD 11/02/2013 11:00 AM

## 2013-11-02 NOTE — Progress Notes (Signed)
Pt arrived to unit accompanied by PACU RN. Oriented to room/unit, VS stable. No s/s of acute distress noted. BLE pulses dopplered. Family at bedside.

## 2013-11-02 NOTE — Transfer of Care (Signed)
Immediate Anesthesia Transfer of Care Note  Patient: Research scientist (physical sciences)  Procedure(s) Performed: Procedure(s): RESECTION LEFT COMMON FEMORAL ARTERY AND INSERTION OF INTERPOSITION 6mm HEMASHIELD GRAFT FROM LEFT EXTERNAL  ILIAC ARTERY TO LEFT PROFUNDA  FEMORIS ARTERY (Left)  Patient Location: PACU  Anesthesia Type:General  Level of Consciousness: awake, alert , oriented and patient cooperative  Airway & Oxygen Therapy: Patient Spontanous Breathing and Patient connected to nasal cannula oxygen  Post-op Assessment: Report given to PACU RN, Post -op Vital signs reviewed and stable and Patient moving all extremities  Post vital signs: Reviewed and stable  Complications: No apparent anesthesia complications

## 2013-11-02 NOTE — Progress Notes (Signed)
Utilization review completed.  

## 2013-11-02 NOTE — Progress Notes (Signed)
Pt states Rt foot feel numb,can feel touch. improved with ROM, no vascular change noted. Cool to lightly warm. doppled pulses remain same . Cap refill 3 sec in all toes. Pink in color.

## 2013-11-02 NOTE — Anesthesia Preprocedure Evaluation (Addendum)
Anesthesia Evaluation  Patient identified by MRN, date of birth, ID band  Airway Mallampati: IV TM Distance: >3 FB Neck ROM: Full    Dental  (+) Edentulous Upper, Edentulous Lower   Pulmonary former smoker,  breath sounds clear to auscultation        Cardiovascular hypertension, + CAD, + Past MI, + Cardiac Stents and + Peripheral Vascular Disease Rhythm:Regular Rate:Normal     Neuro/Psych negative neurological ROS  negative psych ROS   GI/Hepatic negative GI ROS, Neg liver ROS,   Endo/Other  diabetes, Type 2, Insulin DependentMorbid obesity  Renal/GU negative Renal ROS     Musculoskeletal negative musculoskeletal ROS (+)   Abdominal   Peds  Hematology   Anesthesia Other Findings   Reproductive/Obstetrics negative OB ROS                          Anesthesia Physical Anesthesia Plan  ASA: III  Anesthesia Plan: General   Post-op Pain Management:    Induction: Intravenous  Airway Management Planned: Oral ETT  Additional Equipment: None  Intra-op Plan:   Post-operative Plan: Extubation in OR  Informed Consent: I have reviewed the patients History and Physical, chart, labs and discussed the procedure including the risks, benefits and alternatives for the proposed anesthesia with the patient or authorized representative who has indicated his/her understanding and acceptance.   Dental advisory given  Plan Discussed with: CRNA, Anesthesiologist and Surgeon  Anesthesia Plan Comments:         Anesthesia Quick Evaluation

## 2013-11-02 NOTE — Anesthesia Postprocedure Evaluation (Signed)
  Anesthesia Post-op Note  Patient: Leah scientist (physical sciences)  Procedure(s) Performed: Procedure(s): RESECTION LEFT COMMON FEMORAL ARTERY AND INSERTION OF INTERPOSITION 73mm HEMASHIELD GRAFT FROM LEFT EXTERNAL  ILIAC ARTERY TO LEFT PROFUNDA  FEMORIS ARTERY (Left)  Patient Location: PACU  Anesthesia Type:General  Level of Consciousness: awake, alert  and oriented  Airway and Oxygen Therapy: Patient Spontanous Breathing  Post-op Pain: mild  Post-op Assessment: Post-op Vital signs reviewed  Post-op Vital Signs: Reviewed  Last Vitals:  Filed Vitals:   11/02/13 1330  BP: 101/40  Pulse: 77  Temp: 36.3 C  Resp: 14    Complications: No apparent anesthesia complications

## 2013-11-03 ENCOUNTER — Other Ambulatory Visit: Payer: Self-pay | Admitting: Nurse Practitioner

## 2013-11-03 ENCOUNTER — Encounter (HOSPITAL_COMMUNITY): Payer: Self-pay | Admitting: Vascular Surgery

## 2013-11-03 LAB — GLUCOSE, CAPILLARY
GLUCOSE-CAPILLARY: 260 mg/dL — AB (ref 70–99)
GLUCOSE-CAPILLARY: 281 mg/dL — AB (ref 70–99)
Glucose-Capillary: 209 mg/dL — ABNORMAL HIGH (ref 70–99)
Glucose-Capillary: 281 mg/dL — ABNORMAL HIGH (ref 70–99)

## 2013-11-03 LAB — BASIC METABOLIC PANEL
Anion gap: 12 (ref 5–15)
BUN: 12 mg/dL (ref 6–23)
CALCIUM: 9.1 mg/dL (ref 8.4–10.5)
CO2: 26 mEq/L (ref 19–32)
Chloride: 97 mEq/L (ref 96–112)
Creatinine, Ser: 0.63 mg/dL (ref 0.50–1.10)
GLUCOSE: 194 mg/dL — AB (ref 70–99)
Potassium: 4.4 mEq/L (ref 3.7–5.3)
Sodium: 135 mEq/L — ABNORMAL LOW (ref 137–147)

## 2013-11-03 LAB — CBC
HEMATOCRIT: 34.8 % — AB (ref 36.0–46.0)
HEMOGLOBIN: 11.7 g/dL — AB (ref 12.0–15.0)
MCH: 30.2 pg (ref 26.0–34.0)
MCHC: 33.6 g/dL (ref 30.0–36.0)
MCV: 89.7 fL (ref 78.0–100.0)
Platelets: 252 10*3/uL (ref 150–400)
RBC: 3.88 MIL/uL (ref 3.87–5.11)
RDW: 14.4 % (ref 11.5–15.5)
WBC: 13.8 10*3/uL — ABNORMAL HIGH (ref 4.0–10.5)

## 2013-11-03 LAB — HEMOGLOBIN A1C
Hgb A1c MFr Bld: 8.1 % — ABNORMAL HIGH (ref <5.7)
MEAN PLASMA GLUCOSE: 186 mg/dL — AB (ref <117)

## 2013-11-03 MED ORDER — TRAMADOL HCL 50 MG PO TABS: 50.0000 mg | ORAL_TABLET | Freq: Four times a day (QID) | ORAL | Status: DC | PRN

## 2013-11-03 MED ORDER — INSULIN GLARGINE 100 UNIT/ML ~~LOC~~ SOLN
30.0000 [IU] | Freq: Every day | SUBCUTANEOUS | Status: DC
  Administered 2013-11-03 – 2013-11-05 (×3): 30 [IU] via SUBCUTANEOUS
  Filled 2013-11-03 (×4): qty 0.3

## 2013-11-03 NOTE — Evaluation (Signed)
Occupational Therapy Evaluation Patient Details Name: Leah Ferrell MRN: [id number] DOB: [birth year] Today's Date: 11/03/2013    History of Present Illness RESECTION LEFT COMMON FEMORAL ARTERY AND INSERTION OF INTERPOSITION 29mm HEMASHIELD GRAFT FROM LEFT EXTERNAL  ILIAC ARTERY TO LEFT PROFUNDA  FEMORIS ARTERY (Left)   Clinical Impression   Pt presents to OT with decreased I with ADL activity due to problems listed below s/p procedure.  Pt will benefit from skilled OT to increase I with ADL activity and return to pLOF    Follow Up Recommendations  Home health OT    Equipment Recommendations  None recommended by OT       Precautions / Restrictions Precautions Precautions: Fall      Mobility Bed Mobility Overal bed mobility: Needs Assistance Bed Mobility: Supine to Sit;Sit to Supine     Supine to sit: Min guard Sit to supine: Min assist   General bed mobility comments: A provided for L LE. VC for supine to sit to use UE  Transfers Overall transfer level: Needs assistance Equipment used: Rolling walker (2 wheeled) Transfers: Sit to/from Stand Sit to Stand: Min assist         General transfer comment: VC to push through hands. with initial standing, pt reports pain increase 8/10         ADL Overall ADL's : Needs assistance/impaired     Grooming: Set up;Sitting   Upper Body Bathing: Minimal assitance;Sitting   Lower Body Bathing: Sit to/from stand;Moderate assistance   Upper Body Dressing : Set up;Sitting   Lower Body Dressing: Moderate assistance;Sit to/from stand   Toilet Transfer: Minimal assistance;BSC   Toileting- Clothing Manipulation and Hygiene: Minimal assistance;Sit to/from stand       Functional mobility during ADLs: Minimal assistance General ADL Comments: increased time               Pertinent Vitals/Pain Pain Assessment: 0-10 Pain Score: 3  Pain Location: incision Pain Intervention(s): Monitored during session         Extremity/Trunk Assessment Upper Extremity Assessment Upper Extremity Assessment: Generalized weakness   Lower Extremity Assessment Lower Extremity Assessment: LLE deficits/detail;Generalized weakness (R LE WFL for tasks assessed) LLE Deficits / Details: Limited ROM 2/2 pain at incision       Communication Communication Communication: No difficulties   Cognition Arousal/Alertness: Awake/alert Behavior During Therapy: WFL for tasks assessed/performed Overall Cognitive Status: Within Functional Limits for tasks assessed                     General Comments   pt very confident she will do well at home- pt will benefit from [facility] to ensure I            Home Living Family/patient expects to be discharged to:: Private residence Living Arrangements: Alone Available Help at Discharge: Family;Friend(s) Type of Home: Mobile home Home Access: Ramped entrance     Home Layout: One level     Bathroom Shower/Tub: Hospital doctor Toilet: Handicapped height     Home Equipment: Environmental consultant - 2 wheels;Cane - single point;Shower seat - built in          Prior Functioning/Environment Level of Independence: Independent        Comments: but leg hurt    OT Diagnosis: Generalized weakness;Acute pain   OT Problem List: Decreased strength;Decreased activity tolerance;Pain   OT Treatment/Interventions: Self-care/ADL training;Patient/family education;DME and/or AE instruction    OT Goals(Current goals can be found in the care plan section) Acute  Rehab OT Goals Patient Stated Goal: get home and be I OT Goal Formulation: With patient Time For Goal Achievement: 11/17/13 Potential to Achieve Goals: Good ADL Goals Pt Will Perform Grooming: with supervision;standing Pt Will Perform Lower Body Dressing: with supervision;sit to/from stand;with adaptive equipment Pt Will Transfer to Toilet: with supervision;ambulating;regular height toilet Pt Will Perform Toileting -  Clothing Manipulation and hygiene: with supervision;sit to/from stand  OT Frequency: Min 2X/week           Co-evaluation     PT goals addressed during session: Mobility/safety with mobility;Proper use of DME;Balance OT goals addressed during session: Proper use of Adaptive equipment and DME;ADL's and self-care      End of Session    Activity Tolerance: Patient tolerated treatment well Patient left:  in chair with call bell within reach   Time: 1130-1148 OT Time Calculation (min): 18 min Charges:  OT General Charges $OT Visit: 1 Procedure OT Evaluation $Initial OT Evaluation Tier I: 1 Procedure OT Treatments $Self Care/Home Management : 8-22 mins G-Codes:    Payton Mccallum D 2013/11/17, 12:38 PM

## 2013-11-03 NOTE — Progress Notes (Signed)
Report called to receiving nurse on 2W and all questions answered. Pt to transfer shortly to [id number]

## 2013-11-03 NOTE — Progress Notes (Signed)
Inpatient Diabetes Program Recommendations  AACE/ADA: New Consensus Statement on Inpatient Glycemic Control (2013)  Target Ranges:  Prepandial:   less than 140 mg/dL      Peak postprandial:   less than 180 mg/dL (1-2 hours)      Critically ill patients:  140 - 180 mg/dL   Reason for Assessment:  Referral received.  Diabetes history: Type 2 Diabetes Outpatient Diabetes medications: 70/30 98 units tid Current orders for Inpatient glycemic control:  Moderate Novolog tid with meals  Called and discussed with PA.  Consider adding Lantus 30 units daily while patient is in the hospital (0.3 units/kg).  Thanks, Adah Perl, RN, BC-ADM Inpatient Diabetes Coordinator Pager [phone]

## 2013-11-03 NOTE — Progress Notes (Addendum)
  [facility] Progress Note  11/03/2013 9:25 AM 1 Day Post-Op  Subjective:  Patient sitting on side of bed eating breakfast. Having some pain around groin incision.   Tmax 98.8 BP sys 100-130s 02 98%  Filed Vitals:   11/03/13 0736  BP: 123/46  Pulse: 75  Temp: 98.3 F (36.8 C)  Resp: 16    Physical Exam: Incisions:  Minimal drainage on left groin bandage.  Extremities:  + doppler signals DP/PT left foot  CBC    Component Value Date/Time   WBC 13.8* 11/03/2013 0402   WBC 13.4* 09/16/2012 1349   RBC 3.88 11/03/2013 0402   RBC 4.6 09/16/2012 1349   HGB 11.7* 11/03/2013 0402   HGB 13.8 09/16/2012 1349   HCT 34.8* 11/03/2013 0402   HCT 42.2 09/16/2012 1349   PLT 252 11/03/2013 0402   MCV 89.7 11/03/2013 0402   MCV 91.9 09/16/2012 1349   MCH 30.2 11/03/2013 0402   MCH 30.0 09/16/2012 1349   MCHC 33.6 11/03/2013 0402   MCHC 32.7 09/16/2012 1349   RDW 14.4 11/03/2013 0402   LYMPHSABS 3.4 08/23/2013 1205   MONOABS 0.7 08/23/2013 1205   EOSABS 0.5 08/23/2013 1205   BASOSABS 0.1 08/23/2013 1205    BMET    Component Value Date/Time   NA 135* 11/03/2013 0402   NA 137 07/18/2013 1200   K 4.4 11/03/2013 0402   CL 97 11/03/2013 0402   CO2 26 11/03/2013 0402   GLUCOSE 194* 11/03/2013 0402   GLUCOSE 91 07/18/2013 1200   BUN 12 11/03/2013 0402   BUN 12 07/18/2013 1200   CREATININE 0.63 11/03/2013 0402   CALCIUM 9.1 11/03/2013 0402   GFRNONAA >90 11/03/2013 0402   GFRAA >90 11/03/2013 0402    INR    Component Value Date/Time   INR 0.97 10/27/2013 1342     Intake/Output Summary (Last 24 hours) at 11/03/13 0925 Last data filed at 11/03/13 0600  Gross per 24 hour  Intake   1570 ml  Output   3200 ml  Net  -1630 ml     Assessment:  63 y.o. female is s/p:  Resection left common femoral artery and insertion of interposition 4mm hemashield graft from left external iliac artery to left profunda femoris artery 1 Day Post-Op  Plan: -Left doppler signals DP and PT. Minimal drainage  seen on groin bandage. Will take down dressing tomorrow.  -Ambulate, get out of bed. -Add tramadol to pain control. She would like to use less of oxycodone.  -On plavix and aspirin. -DVT prophylaxis:  SCDs -Will transfer to 2W  -Dispo: Anticipate discharge Saturday, 11/05/13  Virgina Jock, PA-C [facility] Office: [phone] Pager: [phone] 11/03/2013 9:25 AM  Agree with above Good Doppler flow left DP and PT-patient has known left superficial femoral occlusion Left inguinal dressing dry will leave in place until discharge Saturday and then redressed Plan transfer to 2 W. and ambulate today and discharge home on Saturday a.m.

## 2013-11-03 NOTE — Evaluation (Signed)
Physical Therapy Evaluation Patient Details Name: Leah Ferrell MRN: [id number] DOB: 11-Mar-1950 Today's Date: 11/03/2013   History of Present Illness  Pt is a 63 y.o. female presenting s/p L ENDARTERECTOMY FEMORAL WITH VEIN PATCH ANGIOPLASTY performed on 11/02/13. Pt with hx of HTN, DM, hyperlipidemia, CAD with NSTEMI placed 04/27/12, PVD, CHF, and SOB.  Clinical Impression  Pt currently presenting with deficits in mobility as noted below and assistance for mobility tasks. Pt would benefit from continued skilled PT to improve independence with mobility and maximize function. Pt is expecting to d/c home with intermittent assistance from her children. This was not recommended, PT and OT recommend 24hr supervision for safety at initial d/c with home health services. Pt provided some resistance to this recommendation, stating that "we will see" in regards to how she is doing at d/c.     Follow Up Recommendations Home health PT;Supervision/Assistance - 24 hour    Equipment Recommendations  None recommended by PT (pt already has RW, cane and shower bench)    Recommendations for Other Services       Precautions / Restrictions Precautions Precautions: Fall      Mobility  Bed Mobility Overal bed mobility: Needs Assistance Bed Mobility: Supine to Sit;Sit to Supine     Supine to sit: Min guard Sit to supine: Min assist   General bed mobility comments: A provided for L LE. VC for supine to sit to use UE  Transfers Overall transfer level: Needs assistance Equipment used: Rolling walker (2 wheeled) Transfers: Sit to/from Stand Sit to Stand: Min assist,         General transfer comment: VC to push through hands. with initial standing, pt reports pain increase 8/10. +1 Min A for stability with ascend.   Ambulation/Gait Ambulation/Gait assistance: +2 safety/equipment;Min assist with wheel chair follow Ambulation Distance (Feet): 40 Feet Assistive device: Rolling walker (2  wheeled) Gait Pattern/deviations: Decreased step length - right;Decreased stance time - left Gait velocity: slow Gait velocity interpretation: Below normal speed for age/gender General Gait Details: Pt requires frequent cueing for technique with RW. Min A provided due to some unsteadiness during amb. Pain reduced from initial standing with ambulation.   Stairs            Wheelchair Mobility    Modified Rankin (Stroke Patients Only)       Balance                                             Pertinent Vitals/Pain Pain Assessment: 0-10 Pain Score: 3  Pain Location: incision Pain Intervention(s): Monitored during session    [place] expects to be discharged to:: Private residence Living Arrangements: Alone Available Help at Discharge: Family;Friend(s) Type of Home: Mobile home Home Access: Ramped entrance     Home Layout: One level Home Equipment: Environmental consultant - 2 wheels;Cane - single point;Shower seat - built in      Prior Function Level of Independence: Independent         Comments: but leg hurt     Hand Dominance        Extremity/Trunk Assessment   Upper Extremity Assessment: Defer to OT evaluation           Lower Extremity Assessment: LLE deficits/detail;Generalized weakness (R LE WFL for tasks assessed)   LLE Deficits / Details: Limited ROM 2/2 pain at incision  Communication   Communication: No difficulties  Cognition Arousal/Alertness: Awake/alert Behavior During Therapy: WFL for tasks assessed/performed Overall Cognitive Status: Within Functional Limits for tasks assessed                      General Comments General comments (skin integrity, edema, etc.): Incision bandaged, no noticable discharge. Discussed d/c plan and needs with pt, some resistance initially about having 24hr supervision. Pt very concerned about any one outside the room seeing her during transfers--"I am a very private  person." Pt seems convinced she will be able to go home and be indpendent at initial d/c, discussed why important for her to have supervision and help initially.     Exercises        Assessment/Plan    PT Assessment Patient needs continued PT services  PT Diagnosis Abnormality of gait;Difficulty walking;Generalized weakness;Acute pain   PT Problem List Decreased strength;Decreased activity tolerance;Decreased range of motion;Decreased balance;Decreased mobility;Decreased knowledge of use of DME;Decreased safety awareness  PT Treatment Interventions DME instruction;Gait training;Functional mobility training;Therapeutic activities;Therapeutic exercise;Balance training;Patient/family education   PT Goals (Current goals can be found in the Care Plan section) Acute Rehab PT Goals Patient Stated Goal: get home and be I PT Goal Formulation: With patient Time For Goal Achievement: 11/17/13 Potential to Achieve Goals: Good    Frequency  Min 3x/week   Barriers to discharge        Co-evaluation PT/OT/SLP Co-Evaluation/Treatment: Yes   PT goals addressed during session: Mobility/safety with mobility;Proper use of DME;Balance OT goals addressed during session: Proper use of Adaptive equipment and DME;ADL's and self-care       End of Session Equipment Utilized During Treatment: Gait belt Activity Tolerance: Patient tolerated treatment well Patient left: in chair;with call bell/phone within reach (in w/c, ready to be transferred to new unit)           Time: 3151-7616 PT Time Calculation (min): 26 min   Charges:         PT G Codes:          Oswin Griffith 11/03/2013, 12:20 PM Levonne Hubert, SPT

## 2013-11-03 NOTE — Evaluation (Signed)
I have reviewed and agree with assessment and POC.  Alben Deeds, [place] DPT  [phone]

## 2013-11-04 ENCOUNTER — Ambulatory Visit: Payer: 59 | Admitting: Cardiology

## 2013-11-04 DIAGNOSIS — Z48812 Encounter for surgical aftercare following surgery on the circulatory system: Secondary | ICD-10-CM

## 2013-11-04 LAB — GLUCOSE, CAPILLARY
Glucose-Capillary: 233 mg/dL — ABNORMAL HIGH (ref 70–99)
Glucose-Capillary: 322 mg/dL — ABNORMAL HIGH (ref 70–99)
Glucose-Capillary: 259 mg/dL — ABNORMAL HIGH (ref 70–99)
Glucose-Capillary: 321 mg/dL — ABNORMAL HIGH (ref 70–99)

## 2013-11-04 LAB — TYPE AND SCREEN
ABO/RH(D): O POS
Antibody Screen: NEGATIVE

## 2013-11-04 MED ORDER — TRAMADOL HCL 50 MG PO TABS: 50.0000 mg | ORAL_TABLET | Freq: Four times a day (QID) | ORAL | Status: DC | PRN

## 2013-11-04 NOTE — Progress Notes (Signed)
VASCULAR LAB PRELIMINARY  ARTERIAL  ABI completed:    RIGHT    LEFT    PRESSURE WAVEFORM  PRESSURE WAVEFORM  BRACHIAL 131 Triphasic BRACHIAL 128 Triphasic     DP 55 Dampened Monophasic  AT 45 Monophasic     PT 57 Monophasic PT 59 Dampened Monophasic    RIGHT LEFT  ABI 0.44 0.45   ABIs bilaterally indicate a severe reduction in arterial flow bilaterally at rest.  Halona Amstutz, RVS 11/04/2013, 1:44 PM  Darlina Sicilian, RDCS 11/04/2013  1:44 PM

## 2013-11-04 NOTE — Progress Notes (Signed)
Occupational Therapy Treatment Patient Details Name: Leah Ferrell MRN: [id number] DOB: Nov 12, 1950 Today's Date: 11/04/2013    History of present illness RESECTION LEFT COMMON FEMORAL ARTERY AND INSERTION OF INTERPOSITION 39mm HEMASHIELD GRAFT FROM LEFT EXTERNAL  ILIAC ARTERY TO LEFT PROFUNDA  FEMORIS ARTERY (Left)   OT comments  Pt making progress with functional goals and should continue with acute OT services to increase level of function and safety  Follow Up Recommendations  Home health OT    Equipment Recommendations  None recommended by OT    Recommendations for Other Services      Precautions / Restrictions Precautions Precautions: Fall Restrictions Weight Bearing Restrictions: No       Mobility Bed Mobility Overal bed mobility: Needs Assistance Bed Mobility: Supine to Sit;Sit to Supine     Supine to sit: Min guard Sit to supine: Min guard      Transfers Overall transfer level: Needs assistance Equipment used: 1 person hand held assist Transfers: Sit to/from Stand Sit to Stand: Min guard              Balance Overall balance assessment: No apparent balance deficits (not formally assessed)                                 ADL       Grooming: Supervision/safety;Set up;Standing       Lower Body Bathing: Sit to/from stand;Minimal assistance       Lower Body Dressing: Sit to/from stand;Minimal assistance   Toilet Transfer: Min guard;Regular Toilet;Grab bars   Toileting- Clothing Manipulation and Hygiene: Sit to/from stand;Min guard       Functional mobility during ADLs: Min guard        Vision  no change from baseline                   Perception Perception Perception Tested?: No   Praxis Praxis Praxis tested?: Not tested    Cognition   Behavior During Therapy: WFL for tasks assessed/performed Overall Cognitive Status: Within Functional Limits for tasks assessed                                                  General Comments  Pt very pleasant and cooperative    Pertinent Vitals/ Pain       Pain Assessment: 0-10 Pain Score: 5  Pain Location: incision Pain Intervention(s): Monitored during session                                            Prior Functioning/Environment   independent           Frequency Min 2X/week     Progress Toward Goals  OT Goals(current goals can now be found in the care plan section)  Progress towards OT goals: Progressing toward goals     Plan Discharge plan remains appropriate                     End of Session Equipment Utilized During Treatment: Gait belt   Activity Tolerance Patient tolerated treatment well   Patient Left in bed;with call bell/phone within reach  Time: 7342-8768 OT Time Calculation (min): 12 min  Charges: OT General Charges $OT Visit: 1 Procedure OT Treatments $Self Care/Home Management : 8-22 mins  Britt Bottom 11/04/2013, 1:12 PM

## 2013-11-04 NOTE — Progress Notes (Signed)
Inpatient Diabetes Program Recommendations  AACE/ADA: New Consensus Statement on Inpatient Glycemic Control (2013)  Target Ranges:  Prepandial:   less than 140 mg/dL      Peak postprandial:   less than 180 mg/dL (1-2 hours)      Critically ill patients:  140 - 180 mg/dL  Results for Leah Ferrell, Leah Ferrell (MRN [id number]) as of 11/04/2013 12:40  Ref. Range 11/03/2013 13:13 11/03/2013 16:44 11/03/2013 20:50 11/04/2013 06:18 11/04/2013 11:29  Glucose-Capillary Latest Range: 70-99 mg/dL 260 (H) 281 (H) 281 (H) 233 (H) 322 (H)   Inpatient Diabetes Program Recommendations Insulin - Meal Coverage: consider adding Novolog 4 units TID with meals per Glycemic Control Order set Meal Coverage May also need to titrate Lantus up to 40 units.  Thank you  Raoul Pitch BSN, RN,CDE Inpatient Diabetes Coordinator [phone] (team pager)

## 2013-11-04 NOTE — Progress Notes (Signed)
  [facility] Progress Note  11/04/2013 2:34 PM 2 Days Post-Op  Subjective:  Doing well today. Some soreness around left groin.  Has been ambulating.   Tmax 100 BP sys 120s-130s 02 98% RA  Filed Vitals:   11/04/13 0537  BP: 126/43  Pulse: 78  Temp: 100 F (37.8 C)  Resp: 18    Physical Exam: Incisions:  Left groin incision clean dry and intact. No hematoma.  Extremities:  DP and PT doppler signals left foot.   CBC    Component Value Date/Time   WBC 13.8* 11/03/2013 0402   WBC 13.4* 09/16/2012 1349   RBC 3.88 11/03/2013 0402   RBC 4.6 09/16/2012 1349   HGB 11.7* 11/03/2013 0402   HGB 13.8 09/16/2012 1349   HCT 34.8* 11/03/2013 0402   HCT 42.2 09/16/2012 1349   PLT 252 11/03/2013 0402   MCV 89.7 11/03/2013 0402   MCV 91.9 09/16/2012 1349   MCH 30.2 11/03/2013 0402   MCH 30.0 09/16/2012 1349   MCHC 33.6 11/03/2013 0402   MCHC 32.7 09/16/2012 1349   RDW 14.4 11/03/2013 0402   LYMPHSABS 3.4 08/23/2013 1205   MONOABS 0.7 08/23/2013 1205   EOSABS 0.5 08/23/2013 1205   BASOSABS 0.1 08/23/2013 1205    BMET    Component Value Date/Time   NA 135* 11/03/2013 0402   NA 137 07/18/2013 1200   K 4.4 11/03/2013 0402   CL 97 11/03/2013 0402   CO2 26 11/03/2013 0402   GLUCOSE 194* 11/03/2013 0402   GLUCOSE 91 07/18/2013 1200   BUN 12 11/03/2013 0402   BUN 12 07/18/2013 1200   CREATININE 0.63 11/03/2013 0402   CALCIUM 9.1 11/03/2013 0402   GFRNONAA >90 11/03/2013 0402   GFRAA >90 11/03/2013 0402    INR    Component Value Date/Time   INR 0.97 10/27/2013 1342     Intake/Output Summary (Last 24 hours) at 11/04/13 1434 Last data filed at 11/04/13 1215  Gross per 24 hour  Intake    240 ml  Output   1650 ml  Net  -1410 ml     Assessment:  63 y.o. female is s/p:  Resection left common femoral artery and insertion of interposition 84mm hemashield graft from left external iliac artery to left profunda femoris artery 2 Days Post-Op  Plan: -Left DP/PT doppler signals. Groin incision  clean and intact. Keep dry with gauze. -Continue ambulation -Continuing to do well  -On plavix and ASA -DVT prophylaxis:  SCDs -Dispo: Discharge home tomorrow.     Virgina Jock, PA-C [facility] Office: [phone] Pager: [phone] 11/04/2013 2:34 PM

## 2013-11-04 NOTE — Progress Notes (Signed)
PT Cancellation Note  Patient Details Name: Leah Ferrell MRN: [id number] DOB: 1950-08-17   Cancelled Treatment:    Reason Eval/Treat Not Completed: Patient declined, no reason specified   [place] 11/04/2013, 3:15 PM

## 2013-11-05 LAB — GLUCOSE, CAPILLARY
GLUCOSE-CAPILLARY: 293 mg/dL — AB (ref 70–99)
GLUCOSE-CAPILLARY: 280 mg/dL — AB (ref 70–99)

## 2013-11-05 NOTE — Progress Notes (Signed)
Subjective: Interval History: none.some thigh numbness following surgery. Reports a sensation last night of having a golf ball under the ball of her foot..   Objective: Vital signs in last 24 hours: Temp:  [98 F (36.7 C)-99.4 F (37.4 C)] 98.6 F (37 C) (09/19 0411) Pulse Rate:  [73-78] 73 (09/19 0411) Resp:  [16-18] 16 (09/19 0411) BP: (124-135)/(35-41) 124/35 mmHg (09/19 0411) SpO2:  [96 %-97 %] 97 % (09/19 0411)  Intake/Output from previous day: 09/18 0701 - 09/19 0700 In: 240 [P.O.:240] Out: 650 [Urine:650] Intake/Output this shift: Total I/O In: 120 [P.O.:120] Out: -   Neuro intact left foot. Foot warm. Audible posterior tibial and dorsalis pedis signal. Groin healing with staples intact  Lab Results:  Recent Labs  11/03/13 0402  WBC 13.8*  HGB 11.7*  HCT 34.8*  PLT 252   BMET  Recent Labs  11/03/13 0402  NA 135*  K 4.4  CL 97  CO2 26  GLUCOSE 194*  BUN 12  CREATININE 0.63  CALCIUM 9.1    Studies/Results: Dg Chest 2 View  10/27/2013   CLINICAL DATA:  Preop femoral endarterectomy  EXAM: CHEST  2 VIEW  COMPARISON:  08/12/2013  FINDINGS: Cardiac enlargement with mild vascular congestion. Negative for edema or effusion. Negative for mass or pneumonia  IMPRESSION: Cardiac enlargement with mild vascular congestion.   Electronically Signed   By: Franchot Gallo M.D.   On: 10/27/2013 14:09   Anti-infectives: Anti-infectives   Start     Dose/Rate Route Frequency Ordered Stop   11/02/13 1800  cefUROXime (ZINACEF) 1.5 g in dextrose 5 % 50 mL IVPB     1.5 g 100 mL/hr over 30 Minutes Intravenous Every 12 hours 11/02/13 1542 11/03/13 0644   11/01/13 1427  cefUROXime (ZINACEF) 1.5 g in dextrose 5 % 50 mL IVPB     1.5 g 100 mL/hr over 30 Minutes Intravenous 30 min pre-op 11/01/13 1427 11/02/13 0849      Assessment/Plan: s/p Procedure(s): RESECTION LEFT COMMON FEMORAL ARTERY AND INSERTION OF INTERPOSITION 23mm HEMASHIELD GRAFT FROM LEFT EXTERNAL  ILIAC ARTERY TO  LEFT PROFUNDA  FEMORIS ARTERY (Left) Stable following left femoral bypass. Discharge to home today. Known superficial femoral artery occlusion.   LOS: 3 days   EARLY, TODD 11/05/2013, 10:19 AM

## 2013-11-05 NOTE — Progress Notes (Signed)
PT Cancellation Note  Patient Details Name: Leah Ferrell MRN: [id number] DOB: 08/21/1950   Cancelled Treatment:    Reason Eval/Treat Not Completed: Other (comment) (Pt refused stating she is not that bad and others need servi)   Ramond Dial 11/05/2013, 11:45 AM  Mee Hives, PT MS Acute Rehab Dept. Number: [phone]

## 2013-11-07 ENCOUNTER — Telehealth: Payer: Self-pay | Admitting: Family

## 2013-11-07 NOTE — Discharge Summary (Signed)
[facility] Discharge Summary  Leah Ferrell 1950-08-26 63 y.o. female  [id number]  Admission Date: 11/02/2013  Discharge Date: 11/05/2013  Physician: [name], MD  Admission Diagnosis: Atherosclerosis of native arteries of the extremities with intermittent claudication    HPI:   This is a 63 y.o. female who was referred by Dr. Fletcher [name] for evaluation of severe claudication left leg. Patient had angiography which revealed a left common iliac stenosis which was treated with stenting. 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 She also has occlusion of the mid left SFA. She has a patent popliteal artery with three-vessel off. She is only able to ambulate 50 feet before stopping. She must use a cart to shop. She has no rest pain or history of nonhealing ulcers infection or gangrene. She does have type 1 diabetes mellitus. She quit smoking 10 years ago. She had a cardiac stent placed several months ago and is on Plavix.   Hospital Course:  The patient was admitted to the hospital and taken to the operating room on 11/02/2013 and underwent: Resection left common femoral artery and insertion of interposition 74mm hemashield graft from left external iliac artery to left profunda femoris artery  The patient tolerated the procedure well and was transported to the PACU in stable condition.   On POD 1, she had good doppler flow in her left foot. Her incisions were healing well. She was transferred to the floor.  On POD 2, she was increasing ambulation. Her incisions were intact and had good doppler flow. She wanted to remain in the hospital for additional therapy. She was discharged home on POD 3 in good condition.   CBC    Component Value Date/Time   WBC 13.8* 11/03/2013 0402   WBC 13.4* 09/16/2012 1349   RBC 3.88 11/03/2013 0402   RBC 4.6 09/16/2012 1349   HGB 11.7* 11/03/2013 0402   HGB 13.8 09/16/2012 1349   HCT 34.8* 11/03/2013 0402   HCT 42.2  09/16/2012 1349   PLT 252 11/03/2013 0402   MCV 89.7 11/03/2013 0402   MCV 91.9 09/16/2012 1349   MCH 30.2 11/03/2013 0402   MCH 30.0 09/16/2012 1349   MCHC 33.6 11/03/2013 0402   MCHC 32.7 09/16/2012 1349   RDW 14.4 11/03/2013 0402   LYMPHSABS 3.4 08/23/2013 1205   MONOABS 0.7 08/23/2013 1205   EOSABS 0.5 08/23/2013 1205   BASOSABS 0.1 08/23/2013 1205    BMET    Component Value Date/Time   NA 135* 11/03/2013 0402   NA 137 07/18/2013 1200   K 4.4 11/03/2013 0402   CL 97 11/03/2013 0402   CO2 26 11/03/2013 0402   GLUCOSE 194* 11/03/2013 0402   GLUCOSE 91 07/18/2013 1200   BUN 12 11/03/2013 0402   BUN 12 07/18/2013 1200   CREATININE 0.63 11/03/2013 0402   CALCIUM 9.1 11/03/2013 0402   GFRNONAA >90 11/03/2013 0402   GFRAA >90 11/03/2013 0402     Discharge Instructions:   The patient is discharged to home with extensive instructions on wound care and progressive ambulation.  They are instructed not to drive or perform any heavy lifting until returning to see the physician in his office.  Discharge Instructions   Call MD for:  redness, tenderness, or signs of infection (pain, swelling, bleeding, redness, odor or green/yellow discharge around incision site)    Complete by:  As directed      Call MD for:  redness, tenderness, or signs of infection (pain, swelling,  bleeding, redness, odor or green/yellow discharge around incision site)    Complete by:  As directed      Call MD for:  severe or increased pain, loss or decreased feeling  in affected limb(s)    Complete by:  As directed      Call MD for:  severe or increased pain, loss or decreased feeling  in affected limb(s)    Complete by:  As directed      Call MD for:  temperature >100.5    Complete by:  As directed      Call MD for:  temperature >100.5    Complete by:  As directed      Discharge wound care:    Complete by:  As directed   Wash the groin wound with soap and water daily and pat dry. (No tub bath-only shower)  Then put a dry gauze or  washcloth there to keep this area dry daily and as needed.  Do not use Vaseline or neosporin on your incisions.  Only use soap and water on your incisions and then protect and keep dry.     Driving Restrictions    Complete by:  As directed   No driving for 2 weeks     Driving Restrictions    Complete by:  As directed   No driving for 2 weeks     Increase activity slowly    Complete by:  As directed   Walk with assistance use walker or cane as needed     Lifting restrictions    Complete by:  As directed   No lifting for 4 weeks     Lifting restrictions    Complete by:  As directed   No lifting for 2 weeks     Resume previous diet    Complete by:  As directed      Resume previous diet    Complete by:  As directed      may wash over wound with mild soap and water    Complete by:  As directed   Keep groins dry with dry gauze. Use hairdryer on groin after shower.           Discharge Diagnosis:  Atherosclerosis of native arteries of the extremities with intermittent claudication   Secondary Diagnosis: Patient Active Problem List   Diagnosis Date Noted  . Peripheral vascular disease, unspecified 10/18/2013  . PAD (peripheral artery disease) 10/18/2013  . Chronic diastolic heart failure 63/33/5456  . Carotid bruit 08/27/2012  . PVD (peripheral vascular disease)   . Precordial pain 04/30/2012  . CAD (coronary artery disease) 04/28/2012  . NSTEMI (non-ST elevated myocardial infarction) 04/23/2012  . Acute diastolic CHF (congestive heart failure) 04/23/2012  . Hypertension   . Diabetes mellitus without complication   . Hyperlipidemia    Past Medical History  Diagnosis Date  . Hypertension   . Diabetes mellitus without complication   . Hyperlipidemia   . CAD (coronary artery disease)     a. 04/2012 NSTEMI: s/p DES to LAD.  Mar[place] Kitchen PVD (peripheral vascular disease)     a. 04/2012 ABI: R 0.49, L 0.39. Angiography 06/14: Significant ostial left common iliac artery stenosis extending  into the distal aorta, severe left common femoral artery stenosis, bilateral SFA occlusion with heavy calcifications. Functionally, one-vessel runoff bilaterally below the knee  . Chronic diastolic CHF (congestive heart failure)   . Leukocytosis   . Morbid obesity   . NSTEMI (non-ST elevated myocardial infarction) 04/27/2012  .  Dysrhythmia   . Shortness of breath   . PONV (postoperative nausea and vomiting)   . Environmental and seasonal allergies        Medication List    STOP taking these medications       cilostazol 100 MG tablet  Commonly known as:  PLETAL      TAKE these medications       albuterol 108 (90 BASE) MCG/ACT inhaler  Commonly known as:  PROVENTIL HFA;VENTOLIN HFA  Inhale 1-2 puffs into the lungs every 6 (six) hours as needed for wheezing or shortness of breath.     amLODipine 10 MG tablet  Commonly known as:  NORVASC  Take 1 tablet (10 mg total) by mouth daily.     aspirin EC 81 MG tablet  Take 81 mg by mouth daily.     CALTRATE 600+D PLUS MINERALS 600-800 MG-UNIT Tabs  Take 1 tablet by mouth 2 (two) times daily.     clopidogrel 75 MG tablet  Commonly known as:  PLAVIX  Take 1 tablet (75 mg total) by mouth daily.     colesevelam 625 MG tablet  Commonly known as:  WELCHOL  Take 625 mg by mouth 2 (two) times daily with a meal.     fish oil-omega-3 fatty acids 1000 MG capsule  Take 1 g by mouth every morning.     furosemide 40 MG tablet  Commonly known as:  LASIX  Take 1 tablet (40 mg total) by mouth daily.     hydroxypropyl methylcellulose / hypromellose 2.5 % ophthalmic solution  Commonly known as:  ISOPTO TEARS / GONIOVISC  Place 1 drop into both eyes 3 (three) times daily as needed (for dry eyes).     Hypromell-Glycerin-Naphazoline 0.8-0.25-0.012 % Soln  Apply 1 application to eye daily as needed (dry eyes).     lisinopril 20 MG tablet  Commonly known as:  PRINIVIL,ZESTRIL  Take 1 tablet (20 mg total) by mouth 2 (two) times daily.      metoprolol succinate 25 MG 24 hr tablet  Commonly known as:  TOPROL-XL  Take 75 mg by mouth 2 (two) times daily.     montelukast 10 MG tablet  Commonly known as:  SINGULAIR  Take 1 tablet (10 mg total) by mouth at bedtime.     multivitamin with minerals Tabs tablet  Take 1 tablet by mouth daily.     nitroGLYCERIN 0.4 MG SL tablet  Commonly known as:  NITROSTAT  Place 1 tablet (0.4 mg total) under the tongue every 5 (five) minutes as needed for chest pain (up to 3 doses).     NOVOLOG MIX 70/30 FLEXPEN [place]  Inject 98 Units into the skin 3 (three) times daily.     PEN NEEDLES 29GX1/2" 29G X 12MM Misc  1 Container by Does not apply route 3 (three) times daily.     potassium chloride SA 20 MEQ tablet  Commonly known as:  K-DUR,KLOR-CON  Take 20 mEq by mouth daily.     pravastatin 80 MG tablet  Commonly known as:  PRAVACHOL  Take 1 tablet (80 mg total) by mouth at bedtime.     traMADol 50 MG tablet  Commonly known as:  ULTRAM  Take 1 tablet (50 mg total) by mouth every 6 (six) hours as needed for moderate pain.     Vitamin C 500 MG Caps  Take 1 capsule by mouth daily.        Tramadol #30 No Refill  Disposition: Home  Patient's condition: is Good  Follow up: 1. Dr. Kellie Simmering in 2 weeks   Virgina Jock, PA-C [facility] [phone] 11/07/2013  9:21 PM  - For VQI Registry use --- Instructions: Press F2 to tab through selections.  Delete question if not applicable.   Post-op:  Wound infection: Yes  Graft infection: No  Transfusion: No   New Arrhythmia: No Ipsilateral amputation: No, [ ]  Minor, [ ]  BKA, [ ]  AKA Discharge patency: [x]  Primary, [ ]  Primary assisted, [ ]  Secondary, [ ]  Occluded Patency judged by: [x]  Dopper only, [ ]  Palpable graft pulse, [ ]  Palpable distal pulse, [ ]  ABI inc. > 0.15, [ ]  Duplex Discharge ABI: R .44, L .45 D/C Ambulatory Status: Ambulatory  Complications: MI: No, [ ]  Troponin only, [ ]  EKG or Clinical CHF:  No Resp failure:No, [ ]  Pneumonia, [ ]  Ventilator Chg in renal function: No, [ ]  Inc. Cr > 0.5, [ ]  Temp. Dialysis, [ ]  Permanent dialysis Stroke: No, [ ]  Minor, [ ]  Major Return to OR: No    Discharge medications: Statin use:  yes ASA use:  yes Plavix use:  yes Beta blocker use: yes Coumadin use: no

## 2013-11-08 MED ORDER — INSULIN ASPART PROT & ASPART (70-30 MIX) 100 UNIT/ML PEN: PEN_INJECTOR | SUBCUTANEOUS | Status: DC

## 2013-11-08 NOTE — Telephone Encounter (Signed)
Rx sent to pharmacy   

## 2013-11-11 ENCOUNTER — Telehealth: Payer: Self-pay

## 2013-11-11 NOTE — Telephone Encounter (Signed)
Phone call from pt.  Reported left groin incision is oozing small amt. Of "a little clear to a little bloody drainage."  Stated there is a little redness that comes and goes.  Stated incision is intact.  Reported some swelling in left foot up to the left knee.  Denies any pain with weight bearing on left leg.  Stated the swelling goes down at night.  Denies fever or chills.  Stated she has been leaving incision open to air, and "dabbing the incision with a paper towel", intermittently, when it oozes drainage.  Encouraged pt. to wash incisional area 1-2 times/day with dial soap, rinse well and pat dry.  Encouraged to cover incision with 4 " gauze dressing and change prn, if dressing becomes soiled.  Enc. to elevate her legs above level of heart approx. 3-4 times / day, to reduce the swelling.  Advised to call office with any s/s of infection; ie: fever, redness/inflammation in incisional area, worsening drainage, or if incision opens-up.  Pt. verb. understanding of instructions.

## 2013-11-15 ENCOUNTER — Ambulatory Visit: Payer: 59 | Admitting: Family

## 2013-11-16 ENCOUNTER — Encounter: Payer: Self-pay | Admitting: Family

## 2013-11-17 ENCOUNTER — Encounter: Payer: Self-pay | Admitting: Family

## 2013-11-17 ENCOUNTER — Ambulatory Visit (INDEPENDENT_AMBULATORY_CARE_PROVIDER_SITE_OTHER): Payer: Self-pay | Admitting: Family

## 2013-11-17 VITALS — BP 146/66 | HR 64 | Temp 97.4°F | Resp 16 | Ht 62.0 in | Wt 202.0 lb

## 2013-11-17 DIAGNOSIS — Z48812 Encounter for surgical aftercare following surgery on the circulatory system: Secondary | ICD-10-CM

## 2013-11-17 DIAGNOSIS — Z4802 Encounter for removal of sutures: Secondary | ICD-10-CM | POA: Insufficient documentation

## 2013-11-17 NOTE — Progress Notes (Signed)
Postoperative Visit   History of Present Illness  Leah Ferrell is a 63 y.o. year old female patient of Dr. Kellie Ferrell who returns today for staple removal, is S/P RESECTION LEFT COMMON FEMORAL ARTERY AND INSERTION OF INTERPOSITION 26mm HEMASHIELD GRAFT FROM LEFT EXTERNAL ILIAC ARTERY TO LEFT PROFUNDA FEMORIS ARTERY  on 11-02-13. She was initially referred by Dr. Fletcher Ferrell for evaluation of severe claudication left leg. Patient had angiography which revealed a left common iliac stenosis which was treated with stenting. 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 She also has occlusion of the mid left SFA. She has a patent popliteal artery with three-vessel off. She was only able to ambulate 50 feet before stopping prior to revascuarization. She had to use a cart to shop. She has no rest pain or history of nonhealing ulcers infection or gangrene. She does have type 1 diabetes mellitus. She quit smoking in 2005. She had a cardiac stent placed several months ago and is on Plavix.  #1 severe lower extremity occlusive disease status post left common iliac stent placement with near total occlusion left common femoral artery and total occlusion of mid left SFA  #2 diabetes mellitus type 1  #3 coronary artery disease with previous drug-eluting stent currently on Plavix-asymptomatic.  Patient denies fever or chills, denies purulent drainage, her post operative pain is manageable.  The patient's left groin  wound is healing.  The patient notes resolution of lower extremity symptoms.  The patient is able to complete their activities of daily living.  The patient's current symptoms are: left lower leg swelling as it seems she is trying to elevate her legs, but not succeeding very well, seems difficult to get in a position that is comfortable for her. She is cleansing the left groin incision with a cleansing agent that medical personnel instructed her to use, and is applying a telfa  dressing. She states her blood sugars are up and down, and realizes the importance of good glycemic control as this relates to adequate wound healing, and also long term effect of poor glycemic control on her heart, brain, eyes, kidneys, and general circulation.   For VQI Use Only  PRE-ADM LIVING: Home  AMB STATUS: Ambulatory  Physical Examination  Filed Vitals:   11/17/13 1308  BP: 146/66  Pulse: 64  Temp: 97.4 F (36.3 C)  TempSrc: Oral  Resp: 16  Height: 5\' 2"  (1.575 m)  Weight: 202 lb (91.627 kg)  SpO2: 98%   Body mass index is 36.94 kg/(m^2).  Left groin: Incision is healing with mild erythema, no drainage, minimal swelling, pedal pulses are not palpable, but left DP is biphasic by Doppler, left PT and peroneal pulses are not Dopplerable. Right PT and DP pulses are monophasic by Doppler, right peroneal pulse is not Dopplerable. No edema in right lower leg, left lower leg has 2+ pitting and non pitting edema.  Medical Decision Making  Leah Ferrell is a 63 y.o. year old female who presents s/p  RESECTION LEFT COMMON FEMORAL ARTERY AND INSERTION OF INTERPOSITION 47mm HEMASHIELD GRAFT FROM LEFT EXTERNAL ILIAC ARTERY TO LEFT PROFUNDA FEMORIS ARTERY on 11-02-13. She presents today for surgical site evaluation and staples removal.     The patient's bypass incisions are healing appropriately with resolution of pre-operative symptoms.  Based on today's exam, and after discussing with Dr. Oneida Ferrell, patient will follow up with Dr. Kellie Ferrell in 1 month with ABI's.  I discussed in depth with the patient the nature of  atherosclerosis, and emphasized the importance of maximal medical management including strict control of blood pressure, blood glucose, and lipid levels, obtaining regular exercise, and cessation of smoking.  The patient is aware that without maximal medical management the underlying atherosclerotic disease process will progress, limiting the benefit of any interventions. I  emphasized the importance of routine surveillance of the patient's bypass, as the vascular surgery literature emphasize the improved patency possible with assisted primary patency procedures versus secondary patency procedures. The patient agrees to participate in their maximal medical care and routine surveillance.  Thank you for allowing [place] to participate in this patient's care.  NICKEL, Leah Leyden, RN, MSN, FNP-C [facility] [place] Office: [phone]  11/17/2013, 1:22 PM  Clinic MD: Leah Ferrell

## 2013-11-17 NOTE — Patient Instructions (Signed)

## 2013-11-18 ENCOUNTER — Telehealth: Payer: Self-pay | Admitting: Family

## 2013-11-18 NOTE — Telephone Encounter (Signed)
appt given for 10/12 at 8:10 with [facility]

## 2013-11-21 ENCOUNTER — Other Ambulatory Visit: Payer: Self-pay | Admitting: Family

## 2013-11-22 ENCOUNTER — Ambulatory Visit (INDEPENDENT_AMBULATORY_CARE_PROVIDER_SITE_OTHER): Payer: 59 | Admitting: Cardiology

## 2013-11-22 ENCOUNTER — Encounter: Payer: Self-pay | Admitting: Cardiology

## 2013-11-22 VITALS — BP 165/72 | HR 75 | Ht 62.0 in | Wt 201.1 lb

## 2013-11-22 DIAGNOSIS — I10 Essential (primary) hypertension: Secondary | ICD-10-CM

## 2013-11-22 DIAGNOSIS — I25119 Atherosclerotic heart disease of native coronary artery with unspecified angina pectoris: Secondary | ICD-10-CM

## 2013-11-22 DIAGNOSIS — I739 Peripheral vascular disease, unspecified: Secondary | ICD-10-CM

## 2013-11-22 DIAGNOSIS — I5032 Chronic diastolic (congestive) heart failure: Secondary | ICD-10-CM

## 2013-11-22 DIAGNOSIS — E785 Hyperlipidemia, unspecified: Secondary | ICD-10-CM

## 2013-11-22 NOTE — Patient Instructions (Signed)
There were no changes to your medications. Continue as directed. Record your blood pressure on BP Log given for 2 weeks. Call our office with your readings. Your physician wants you to follow up in: 6 months.  You will receive a reminder letter in the mail one-two months in advance.  If you don't receive a letter, please call our office to schedule the follow up appointment.

## 2013-11-22 NOTE — Progress Notes (Signed)
Clinical Summary Leah Ferrell is a 63 y.o.female seen today for follow up of the following medical problems.   1. CAD  - prior NSTEMI 04/2012 with DES to LAD  - 07/21/2013 cath at [facility] after abnormal nuclear stress test. Succesfull pci to LAD with DES.  - denies any recent symptoms since last visit  2. PAD  - followed by Dr Fletcher [name], recent stenting of left common iliac. Continued symptoms, referred to vascular. She received a graft from left external iliac to left profunda femoris by Dr Kellie Simmering 11/02/13. - leg pain is improved  3. Chronic diastolic heart failure  - denis any SOB, DOE, orthopnea, or PND  - compliant with diuretic, limits sodium intake. Occasionally takes advil but rarely.   4. HTN  - does not check regularly  - compliant with meds   5. Hyperlipidemia  - compliant with statin  - 03/2013 TC 220 TG 193 HDL 49 LDL 106  - does not want stronger statin, prior side effects on lipitor     Past Medical History  Diagnosis Date  . Hypertension   . Diabetes mellitus without complication   . Hyperlipidemia   . CAD (coronary artery disease)     a. 04/2012 NSTEMI: s/p DES to LAD.  Marland Kitchen PVD (peripheral vascular disease)     a. 04/2012 ABI: R 0.49, L 0.39. Angiography 06/14: Significant ostial left common iliac artery stenosis extending into the distal aorta, severe left common femoral artery stenosis, bilateral SFA occlusion with heavy calcifications. Functionally, one-vessel runoff bilaterally below the knee  . Chronic diastolic CHF (congestive heart failure)   . Leukocytosis   . Morbid obesity   . NSTEMI (non-ST elevated myocardial infarction) 04/27/2012  . Dysrhythmia   . Shortness of breath   . PONV (postoperative nausea and vomiting)   . Environmental and seasonal allergies      Allergies  Allergen Reactions  . Tape Rash     Current Outpatient Prescriptions  Medication Sig Dispense Refill  . albuterol (PROVENTIL HFA;VENTOLIN HFA) 108 (90 BASE) MCG/ACT  inhaler Inhale 1-2 puffs into the lungs every 6 (six) hours as needed for wheezing or shortness of breath.  9 Inhaler  0  . amLODipine (NORVASC) 10 MG tablet Take 1 tablet (10 mg total) by mouth daily.  30 tablet  6  . Ascorbic Acid (VITAMIN C) 500 MG CAPS Take 1 capsule by mouth daily.       Marland Kitchen aspirin EC 81 MG tablet Take 81 mg by mouth daily.       . Calcium Carbonate-Vit D-Min (CALTRATE 600+D PLUS MINERALS) 600-800 MG-UNIT TABS Take 1 tablet by mouth 2 (two) times daily.      . clopidogrel (PLAVIX) 75 MG tablet Take 1 tablet (75 mg total) by mouth daily.  90 tablet  3  . colesevelam (WELCHOL) 625 MG tablet Take 625 mg by mouth 2 (two) times daily with a meal.      . fish oil-omega-3 fatty acids 1000 MG capsule Take 1 g by mouth every morning.      . furosemide (LASIX) 40 MG tablet Take 1 tablet (40 mg total) by mouth daily.  30 tablet  2  . hydroxypropyl methylcellulose (ISOPTO TEARS) 2.5 % ophthalmic solution Place 1 drop into both eyes 3 (three) times daily as needed (for dry eyes).      . Hypromell-Glycerin-Naphazoline 0.8-0.25-0.012 % SOLN Apply 1 application to eye daily as needed (dry eyes).       Marland Kitchen  Insulin Aspart Prot & Aspart (NOVOLOG MIX 70/30 FLEXPEN) (70-30) 100 UNIT/ML Pen Use 98 unit TID with meals  15 mL  11  . Insulin Pen Needle (PEN NEEDLES 29GX1/2") 29G X 12MM MISC 1 Container by Does not apply route 3 (three) times daily.  50 each  11  . lisinopril (PRINIVIL,ZESTRIL) 20 MG tablet Take 1 tablet (20 mg total) by mouth 2 (two) times daily.  60 tablet  6  . metoprolol succinate (TOPROL-XL) 25 MG 24 hr tablet Take 75 mg by mouth 2 (two) times daily.      . montelukast (SINGULAIR) 10 MG tablet Take 1 tablet (10 mg total) by mouth at bedtime.  30 tablet  3  . Multiple Vitamin (MULTIVITAMIN WITH MINERALS) TABS Take 1 tablet by mouth daily.      . nitroGLYCERIN (NITROSTAT) 0.4 MG SL tablet Place 1 tablet (0.4 mg total) under the tongue every 5 (five) minutes as needed for chest pain (up  to 3 doses).  25 tablet  4  . potassium chloride SA (K-DUR,KLOR-CON) 20 MEQ tablet Take 20 mEq by mouth daily.      . pravastatin (PRAVACHOL) 80 MG tablet Take 1 tablet (80 mg total) by mouth at bedtime.  30 tablet  3  . traMADol (ULTRAM) 50 MG tablet Take 1 tablet (50 mg total) by mouth every 6 (six) hours as needed for moderate pain.  30 tablet  0   No current facility-administered medications for this visit.     Past Surgical History  Procedure Laterality Date  . 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 Lad stent    . Iliac artery stent Left 08/31/2013  . Cardiac catheterization      stents X 2  . Endarterectomy femoral Left 11/02/2013    Procedure: RESECTION LEFT COMMON FEMORAL ARTERY AND INSERTION OF INTERPOSITION 36mm HEMASHIELD GRAFT FROM LEFT EXTERNAL  ILIAC ARTERY TO LEFT PROFUNDA  FEMORIS ARTERY;  Surgeon: Mal Misty, MD;  Location: [facility];  Service: Vascular;  Laterality: Left;     Allergies  Allergen Reactions  . Tape Rash      Family History  Problem Relation Age of Onset  . Diabetes Mother   . Hyperlipidemia Mother   . Hypertension Mother   . Peripheral vascular disease Mother      Social History [name] reports that she quit smoking about 12 years ago. She quit smokeless tobacco use about 12 years ago. [name] reports that she does not drink alcohol.   Review of Systems CONSTITUTIONAL: No weight loss, fever, chills, weakness or fatigue.  HEENT: Eyes: No visual loss, blurred vision, double vision or yellow sclerae.No hearing loss, sneezing, congestion, runny nose or sore throat.  SKIN: No rash or itching.  CARDIOVASCULAR: per HPI  RESPIRATORY: No shortness of breath, cough or sputum.  GASTROINTESTINAL: No anorexia, nausea, vomiting or diarrhea. No abdominal pain or blood.  GENITOURINARY: No burning on urination, no polyuria NEUROLOGICAL: No headache, dizziness, syncope, paralysis, ataxia, numbness or tingling in the extremities. No  change in bowel or bladder control.  MUSCULOSKELETAL: No muscle, back pain, joint pain or stiffness.  LYMPHATICS: No enlarged nodes. No history of splenectomy.  PSYCHIATRIC: No history of depression or anxiety.  ENDOCRINOLOGIC: No reports of sweating, cold or heat intolerance. No polyuria or polydipsia.  Marland Kitchen   Physical Examination p 75 bp 150/70 Wt 201 lbs BMI 37 Gen: resting comfortably, no acute distress HEENT: no scleral icterus, pupils equal  round and reactive, no palptable cervical adenopathy,  CV: RRR, no m/r/g, no JVD, no carotid bruits Resp: Clear to auscultation bilaterally GI: abdomen is soft, non-tender, non-distended, normal bowel sounds, no hepatosplenomegaly MSK: extremities are warm, 1+ LLE edema Skin: warm, no rash Neuro:  no focal deficits Psych: appropriate affect   Diagnostic Studies 04/2012 Echo: LVEF 55-60%, no WMA, mild LVH, grade I diastolic dysfunction  0/8144 ABI: Right 0.57 left 0.48. Moderate left iliac disease, distal right SFA occlusion with reconstitution at the popliteal.  04/2012 Carotid US: bilateral plaque without significant stenosis.  04/2012 Cardiac Cath  PROCEDURAL FINDINGS  Hemodynamics:  AO 155/58 mean 91 mm Hg  LV 163/13 mm Hg  Coronary angiography:  Coronary dominance: right  Left mainstem: Short with shared coronary ostia for the LAD and LCX.  Left anterior descending (LAD): diffuse 30-40% proximal. 90% focal lesion after the first diagonal.  Left circumflex (LCx): <20% proximal and mid. 80% very small distal OM.  Right coronary artery (RCA): Diffuse 20% proximal and mid vessel disease. Moderate calcification.  Left ventriculography: Left ventricular systolic function is normal, LVEF is estimated at 55-65%, there is no significant mitral regurgitation  PCI Note: Following the diagnostic procedure, the decision was made to proceed with PCI. The radial sheath was upsized to a 6 [place]. Bilinta 180 mg was given orally. Weight-based bivalirudin  was given for anticoagulation. Once a therapeutic ACT was achieved, a 6 [place] XB 3.0 guide catheter was inserted. A prowater coronary guidewire was used to cross the lesion. The lesion was predilated with a 2.5 mm balloon. The lesion was then stented with a 3.0x 16 mm Promus premier stent. The stent was postdilated with a 3.25 mm noncompliant balloon. Following PCI, there was 0% residual stenosis and TIMI-3 flow. Final angiography confirmed an excellent result. The patient tolerated the procedure well. There were no immediate procedural complications. A TR band was used for radial hemostasis. The patient was transferred to the post catheterization recovery area for further monitoring.  PCI Data:  Vessel - LAD/Segment - mid  Percent Stenosis (pre) 90%  TIMI-flow 3  Stent 3.0 x 16 mm Promus premier  Percent Stenosis (post) 0%  TIMI-flow (post) 3  Final Conclusions:  1. Single vessel obstructive CAD  2. Normal LV function.  3. Successful stenting of the mid LAD with a DES.    07/2012 Lower Extremity Angiography  Hemodynamics:  Central Aortic Pressure / Mean Aortic Pressure: 157/70  Findings:  Abdominal aorta: Normal in size with no aneurysm. There is mild atherosclerosis below the renal arteries. Distally above the iliac bifurcation there is a calcified plaque at the origin of the left external iliac causing 30% stenosis.  Left renal artery: Normal  Right renal artery: Normal  Celiac artery: Patent  Superior mesenteric artery: Patent  Right common iliac artery: Diffuse 20% disease.  Right internal iliac artery: Normal  Right external iliac artery: Normal  Right common femoral artery: Minor irregularities.  Right profunda femoral artery: Diffuse 20% disease proximally.  Right superficial femoral artery: Diffuse atherosclerosis throughout its course. There is 20% disease at the ostium. This is followed by 70% stenosis proximally. There is another 70% stenosis in the midsegment followed by an  occlusion distally Reconstituting in the proximal popliteal artery. This segment is about 50 mm and heavily calcified.  Right popliteal artery: 20-30% disease proximally.  Right tibial peroneal trunk: Normal  Right anterior tibial artery: Diffusely diseased proximally and seems to be occluded in the midsegment.  Right peroneal  artery: Patent but diffusely diseased.  Right posterior tibial artery: Patent and the dominant vessel below the knee. It has minor irregularities.  Left common iliac artery: There is a 70-80 % heavily calcified lesion proximally.  Left internal iliac artery: Diffusely diseased.  Left external iliac artery: Minor irregularities.  Left common femoral artery: Heavily calcified with diffuse 90% disease extending into the ostium of the SFA.  Left profunda femoral artery: Normal.  Left superficial femoral artery: 70% ostial disease with heavy calcifications. Diffuse 40% disease proximally. The vessel is occluded in the midsegment with reconstitution distally via collaterals from the profunda.  Left popliteal artery: Minor irregularities.  Left tibial peroneal trunk: Minor irregularities.  Left anterior tibial artery: Patent. This is the dominant vessel.  Left peroneal artery: Patent but diffusely diseased.  Left posterior tibial artery: Occlude proximally. Conclusions:  1. Significant heavily calcified ostial left common iliac artery stenosis with plaque extending into the distal aorta.  2. Severe heavily calcified disease in the left common femoral artery.  3. Bilateral SFA occlusion with heavy calcifications.  4. Significant below the knee disease bilaterally as outlined above.  Recommendations:  The patient has diffuse and heavily calcified peripheral arterial disease. Revascularization options are somewhat difficult by endovascular means. There is heavily calcified disease in the left common femoral artery which is not approachable by endovascular options. Endarterectomy  is the best option for this. The stenosis in the left common iliac artery can be treated with stenting but will require likely stenting the distal aorta as well. Both SFAs are diffusely diseased, calcified with chronic occlusion. We'll attempt medical therapy and a walking program for now.    11/2012 Labs: 140 K 4.2 Cl 97 Cr 0.75 BUN 9 AST 16 ALT 21 T.bili 0.2 TC 205 TG 169 HDL 70 LDL 101  01/2013 Event Monitor  No symptoms, no arrythmias  06/2013 Lexiscan MPI  Large reversible defect in the basal anterior, basal anteroseptal, mid anterior, mid anteroseptal, apical anteior, apical segments mild to moderate.  07/21/13 Cath  Hemodynamic Measurements Pressures:  - AO 130 / 56. - LV systolic 161 LV End-diastolic 13.  Angiographic Findings: Left main: Normal Left anterior descending: There is 70% stenosis at the proximal segment, first diagonal is intermediate in size, there is a stent in mid LAD which is patent. Left circumflex: Angiographically normal Right coronary artery: There is 30% narrowing in the mid segment. Right dominant  Left ventriculogram: Not done.   INTERVENTIONAL PROCEDURE NARRATIVE:  Intra-operative Anticoagulation: Additional dose of 2500 heparin IV  Equipment - Guide Catheters: 6 [place] EBU 3 - Guide Wires: Run-through  Procedure Details: I advanced a 3.0 x 12 balloon and predilated the lesion up to 14 atmospheres for 20 seconds and then I repeated that for another 14 atmospheres for 20 seconds. Then I advanced a Promus 3.5 x 16 drug-eluting stent and deployed it at the ostium and  proximal LAD with extreme care not to involve the ostium of left circumflex. I deployed the stent at 16 atmospheres, for 45 seconds, enlarging the stent to 3.7 mm. Post deployment angiography showed excellent results no complications. I gave the patient  Effient 60 mg, removed the catheter and used a TR band to obtain hemostasis.Marland Kitchen  Post-operative Anticoagulation: Aspirin and  Effient  Complications: None  IMPRESSION: 1. Successful PCI to proximal LAD with a Promus 3.5 x 16 drug-eluting stent, postdilated to 3.7 mm. 2. Patent mid LAD stent. 3. Normal left ventricular end-diastolic pressure of 13 mm Hg. 4. Coronary  angiography and percutaneous coronary intervention performed with access through the right radial artery.  PLAN: 1. Aspirin 81 mg and Effient 10 mg daily for at least one year 2. Continue medical management of coronary artery disease. 3. Due to the fact that patient has significant life style limiting Rutherford class III claudication, I will proceed with PVR and duplex study. 4. Followup with Dr. Hamilton Capri.        Assessment and Plan  1. CAD - no current symptoms, continue current medical therapy - DAPT at least until 07/2014  2. PAD - recent bypass by vascular surgery, recovering fairly well - continue to follow with Dr Fletcher [name] and vascular  3. Chronic diastolic HF - appears euvolemic in clinic - continue bp control, current diuretic therapy.   4. HTN - elevated in clinic today, she will keep bp log x 2 weeks and call with results. Pending results may need intensified therapy. She attributes current increase due to some lingering leg pain related to recent surgery  5. Hyperlipidemia - side effects on lipitor previously, she has not wanted to try other high dose statins. Continue pravastatin for now as she is tolerating it. Reports upcoming lipid panel with her pcp   F/u 6 months     Arnoldo Lenis, M.D.

## 2013-11-28 ENCOUNTER — Ambulatory Visit (INDEPENDENT_AMBULATORY_CARE_PROVIDER_SITE_OTHER): Payer: 59 | Admitting: Family

## 2013-11-28 ENCOUNTER — Encounter: Payer: Self-pay | Admitting: Family

## 2013-11-28 VITALS — BP 122/61 | HR 79 | Temp 97.5°F | Ht 62.0 in | Wt 204.2 lb

## 2013-11-28 DIAGNOSIS — I25119 Atherosclerotic heart disease of native coronary artery with unspecified angina pectoris: Secondary | ICD-10-CM

## 2013-11-28 DIAGNOSIS — Z1321 Encounter for screening for nutritional disorder: Secondary | ICD-10-CM

## 2013-11-28 DIAGNOSIS — I739 Peripheral vascular disease, unspecified: Secondary | ICD-10-CM

## 2013-11-28 DIAGNOSIS — I5032 Chronic diastolic (congestive) heart failure: Secondary | ICD-10-CM

## 2013-11-28 DIAGNOSIS — E785 Hyperlipidemia, unspecified: Secondary | ICD-10-CM

## 2013-11-28 DIAGNOSIS — I10 Essential (primary) hypertension: Secondary | ICD-10-CM

## 2013-11-28 DIAGNOSIS — E119 Type 2 diabetes mellitus without complications: Secondary | ICD-10-CM

## 2013-11-28 DIAGNOSIS — J3089 Other allergic rhinitis: Secondary | ICD-10-CM

## 2013-11-28 LAB — POCT GLYCOSYLATED HEMOGLOBIN (HGB A1C): Hemoglobin A1C: 7.2

## 2013-11-28 LAB — POCT UA - MICROALBUMIN: Microalbumin Ur, POC: 20 mg/L

## 2013-11-28 MED ORDER — LISINOPRIL 20 MG PO TABS: 20.0000 mg | ORAL_TABLET | Freq: Two times a day (BID) | ORAL | Status: DC

## 2013-11-28 MED ORDER — MONTELUKAST SODIUM 10 MG PO TABS: 10.0000 mg | ORAL_TABLET | Freq: Every day | ORAL | Status: DC

## 2013-11-28 MED ORDER — "PEN NEEDLES 1/2"" 29G X 12MM MISC": 1.0000 | Freq: Three times a day (TID) | Status: DC

## 2013-11-28 MED ORDER — FUROSEMIDE 40 MG PO TABS: 40.0000 mg | ORAL_TABLET | Freq: Every day | ORAL | Status: DC

## 2013-11-28 MED ORDER — PRAVASTATIN SODIUM 80 MG PO TABS: 80.0000 mg | ORAL_TABLET | Freq: Every day | ORAL | Status: DC

## 2013-11-28 NOTE — Patient Instructions (Signed)

## 2013-11-28 NOTE — Progress Notes (Signed)
Subjective:    Patient ID: Leah Ferrell, female    DOB: January 15, 1951, 63 y.o.   MRN: [id number]  Diabetes She presents for her follow-up diabetic visit. She has type 2 diabetes mellitus. Her disease course has been stable. There are no hypoglycemic associated symptoms. Pertinent negatives for hypoglycemia include no headaches. Associated symptoms include fatigue and foot paresthesias. Pertinent negatives for diabetes include no blurred vision, no foot ulcerations and no visual change. There are no hypoglycemic complications. Diabetic complications include heart disease and peripheral neuropathy. Pertinent negatives for diabetic complications include no CVA or nephropathy. Risk factors for coronary artery disease include dyslipidemia, diabetes mellitus, hypertension, obesity and post-menopausal. Current diabetic treatment includes insulin injections. She is compliant with treatment all of the time. Her weight is stable. She is following a generally healthy diet. Her breakfast blood glucose range is generally 110-130 mg/dl. An ACE inhibitor/angiotensin II receptor blocker is being taken. She sees a podiatrist.Eye exam is current.  Hyperlipidemia This is a chronic problem. The current episode started more than 1 year ago. The problem is uncontrolled. Recent lipid tests were reviewed and are high. Exacerbating diseases include diabetes and obesity. She has no history of hypothyroidism. Factors aggravating her hyperlipidemia include beta blockers. Associated symptoms include leg pain. Pertinent negatives include no shortness of breath. Current antihyperlipidemic treatment includes statins. The current treatment provides moderate improvement of lipids. Compliance problems include adherence to diet and medication cost.  Risk factors for coronary artery disease include diabetes mellitus, dyslipidemia, family history, hypertension, obesity and post-menopausal.  Hypertension This is a chronic problem. The current  episode started more than 1 year ago. The problem has been resolved since onset. The problem is controlled. Associated symptoms include palpitations. Pertinent negatives include no blurred vision, headaches, peripheral edema or shortness of breath. Risk factors for coronary artery disease include diabetes mellitus, dyslipidemia, family history, post-menopausal state and obesity. Past treatments include beta blockers, calcium channel blockers and ACE inhibitors. The current treatment provides moderate improvement. Hypertensive end-organ damage includes CAD/MI. There is no history of CVA, heart failure or a thyroid problem.   *Pt see's cardiology who manages her cholesterol levels. Pt states it was high, but states she is working with diet and exercise for the next 3 months before increasing her medication.    Review of Systems  Constitutional: Positive for fatigue.  HENT: Negative.   Eyes: Negative.  Negative for blurred vision.  Respiratory: Negative.  Negative for shortness of breath.   Cardiovascular: Positive for palpitations.  Gastrointestinal: Negative.   Endocrine: Negative.   Genitourinary: Negative.   Musculoskeletal: Negative.   Neurological: Negative.  Negative for headaches.  Hematological: Negative.   Psychiatric/Behavioral: Negative.   All other systems reviewed and are negative.      Objective:   Physical Exam  Vitals reviewed. Constitutional: She is oriented to person, place, and time. She appears well-developed and well-nourished. No distress.  HENT:  Head: Normocephalic and atraumatic.  Right Ear: External ear normal.  Left Ear: External ear normal.  Nose: Nose normal.  Mouth/Throat: Oropharynx is clear and moist.  Eyes: Pupils are equal, round, and reactive to light.  Neck: Normal range of motion. Neck supple. No thyromegaly present.  Cardiovascular: Normal rate, regular rhythm, normal heart sounds and intact distal pulses.   No murmur heard. Pulmonary/Chest:  Effort normal and breath sounds normal. No respiratory distress. She has no wheezes.  Abdominal: Soft. Bowel sounds are normal. She exhibits no distension. There is no tenderness.  Musculoskeletal: Normal range  of motion. She exhibits edema (2+ in BLE). She exhibits no tenderness.  Neurological: She is alert and oriented to person, place, and time. She has normal reflexes. No cranial nerve deficit.  Skin: Skin is warm and dry.  Psychiatric: She has a normal mood and affect. Her behavior is normal. Judgment and thought content normal.    BP 122/61  Pulse 79  Temp(Src) 97.5 F (36.4 C) (Oral)  Ht 5' 2" (1.575 m)  Wt 204 lb 3.2 oz (92.625 kg)  BMI 37.34 kg/m2       Assessment & Plan:  1. PVD (peripheral vascular disease) - furosemide (LASIX) 40 MG tablet; Take 1 tablet (40 mg total) by mouth daily.  Dispense: 90 tablet; Refill: 4 - CMP14+EGFR  2. Hyperlipidemia - pravastatin (PRAVACHOL) 80 MG tablet; Take 1 tablet (80 mg total) by mouth at bedtime.  Dispense: 90 tablet; Refill: 4 - CMP14+EGFR - Lipid panel  3. Diabetes mellitus without complication - lisinopril (PRINIVIL,ZESTRIL) 20 MG tablet; Take 1 tablet (20 mg total) by mouth 2 (two) times daily.  Dispense: 180 tablet; Refill: 4 - POCT glycosylated hemoglobin (Hb A1C) - POCT UA - Microalbumin - CMP14+EGFR - Insulin Pen Needle (PEN NEEDLES 29GX1/2") 29G X 12MM MISC; 1 Container by Does not apply route 3 (three) times daily.  Dispense: 50 each; Refill: 11  4. Essential hypertension - lisinopril (PRINIVIL,ZESTRIL) 20 MG tablet; Take 1 tablet (20 mg total) by mouth 2 (two) times daily.  Dispense: 180 tablet; Refill: 4 - CMP14+EGFR  5. Chronic diastolic heart failure - lisinopril (PRINIVIL,ZESTRIL) 20 MG tablet; Take 1 tablet (20 mg total) by mouth 2 (two) times daily.  Dispense: 180 tablet; Refill: 4 - furosemide (LASIX) 40 MG tablet; Take 1 tablet (40 mg total) by mouth daily.  Dispense: 90 tablet; Refill: 4 -  CMP14+EGFR  6. Coronary artery disease with unspecified angina pectoris - CMP14+EGFR  7. Other allergic rhinitis - montelukast (SINGULAIR) 10 MG tablet; Take 1 tablet (10 mg total) by mouth at bedtime.  Dispense: 90 tablet; Refill: 4 - CMP14+EGFR  8. Encounter for vitamin deficiency screening - Vit D  25 hydroxy (rtn osteoporosis monitoring)   Continue all meds Labs pending Health Maintenance reviewed-refused flu shot Diet and exercise encouraged RTO 4 months  Evelina Dun, FNP

## 2013-11-28 NOTE — Addendum Note (Signed)
Addended by: Pollyann Kennedy F on: 11/28/2013 09:40 AM   Modules accepted: Orders

## 2013-11-29 ENCOUNTER — Other Ambulatory Visit: Payer: Self-pay | Admitting: Family

## 2013-11-29 LAB — CMP14+EGFR
ALBUMIN: 4.4 g/dL (ref 3.6–4.8)
ALK PHOS: 73 IU/L (ref 39–117)
ALT: 16 IU/L (ref 0–32)
AST: 15 IU/L (ref 0–40)
Albumin/Globulin Ratio: 1.1 (ref 1.1–2.5)
BILIRUBIN TOTAL: 0.2 mg/dL (ref 0.0–1.2)
BUN / CREAT RATIO: 11 (ref 11–26)
BUN: 9 mg/dL (ref 8–27)
CHLORIDE: 94 mmol/L — AB (ref 97–108)
CO2: 28 mmol/L (ref 18–29)
Calcium: 10.2 mg/dL (ref 8.7–10.3)
Creatinine, Ser: 0.81 mg/dL (ref 0.57–1.00)
GFR calc non Af Amer: 78 mL/min/{1.73_m2} (ref >59)
GFR, EST AFRICAN AMERICAN: 90 mL/min/{1.73_m2} (ref >59)
Globulin, Total: 4 g/dL (ref 1.5–4.5)
Glucose: 56 mg/dL — ABNORMAL LOW (ref 65–99)
POTASSIUM: 4.1 mmol/L (ref 3.5–5.2)
Sodium: 139 mmol/L (ref 134–144)
Total Protein: 8.4 g/dL (ref 6.0–8.5)

## 2013-11-29 LAB — LIPID PANEL
Chol/HDL Ratio: 2.7 ratio units (ref 0.0–4.4)
Cholesterol, Total: 215 mg/dL — ABNORMAL HIGH (ref 100–199)
HDL: 81 mg/dL (ref >39)
LDL CALC: 107 mg/dL — AB (ref 0–99)
Triglycerides: 136 mg/dL (ref 0–149)
VLDL Cholesterol Cal: 27 mg/dL (ref 5–40)

## 2013-11-29 LAB — MICROALBUMIN, URINE

## 2013-11-29 LAB — VITAMIN D 25 HYDROXY (VIT D DEFICIENCY, FRACTURES): VIT D 25 HYDROXY: 38.7 ng/mL (ref 30.0–100.0)

## 2013-11-29 MED ORDER — ROSUVASTATIN CALCIUM 20 MG PO TABS: 20.0000 mg | ORAL_TABLET | Freq: Every day | ORAL | Status: DC

## 2013-12-02 ENCOUNTER — Telehealth: Payer: Self-pay | Admitting: *Deleted

## 2013-12-02 NOTE — Telephone Encounter (Signed)
All readings were taken at 12 noon. 2 hours after meal and resting.  10/7 - 125/48 HR 81 10/8 - 124/55 HR 83 10/9 - 152/58 HR 74 10/10 - 147/58 HR 70 10/11 144/56 HR 78 10/12 - 131/46 HR 82 10/13 - 139/59 HR 81 10/14 - 149/55 82 10/15 - 147/56 72 10/16 - 137/52 HR 82  Pt also wanted to make Dr. Harl Bowie aware that she had her blood work done and her cholesterol was elevated. PCP Stopped Pravastatin and started Crestor.

## 2013-12-05 NOTE — Telephone Encounter (Signed)
Blood pressures for the most part look pretty good. Will continue current meds  Zandra Abts MD

## 2013-12-05 NOTE — Telephone Encounter (Signed)
Pt informed

## 2013-12-19 ENCOUNTER — Encounter: Payer: Self-pay | Admitting: Vascular Surgery

## 2013-12-20 ENCOUNTER — Encounter: Payer: Self-pay | Admitting: Vascular Surgery

## 2013-12-20 ENCOUNTER — Ambulatory Visit (INDEPENDENT_AMBULATORY_CARE_PROVIDER_SITE_OTHER): Payer: Self-pay | Admitting: Vascular Surgery

## 2013-12-20 ENCOUNTER — Ambulatory Visit (HOSPITAL_COMMUNITY)
Admission: RE | Admit: 2013-12-20 | Discharge: 2013-12-20 | Disposition: A | Discharge status: Home / Self Care | Payer: 59 | Source: Ambulatory Visit | Attending: Vascular Surgery | Admitting: Vascular Surgery

## 2013-12-20 VITALS — BP 154/61 | HR 88 | Temp 97.7°F | Resp 18 | Ht 62.0 in | Wt 204.0 lb

## 2013-12-20 DIAGNOSIS — Z48812 Encounter for surgical aftercare following surgery on the circulatory system: Secondary | ICD-10-CM

## 2013-12-20 DIAGNOSIS — I739 Peripheral vascular disease, unspecified: Secondary | ICD-10-CM

## 2013-12-20 DIAGNOSIS — M7989 Other specified soft tissue disorders: Secondary | ICD-10-CM | POA: Diagnosis not present

## 2013-12-20 DIAGNOSIS — Z4802 Encounter for removal of sutures: Secondary | ICD-10-CM

## 2013-12-20 NOTE — Progress Notes (Signed)
Subjective:     Patient ID: Leah Ferrell, female   DOB: 1950/11/03, 63 y.o.   MRN: [id number]  HPIthis 63 year old female returns for initial follow-up regarding her procedure 418 near-total occlusion of the left common femoral artery. She had previously had stent placement in the left common iliac artery by Dr. Fletcher [name] . I performed a interposition graft replacement of her common femoral artery from the external iliac to the profunda. Superficial femoral artery is known to be totally occluded. She now is able to ambulate long distances without claudication. She is not limited as she was before. She does have some numbness in the medial aspect of her left thigh but otherwise has no complaints.     Review of Systems     Objective:   Physical Exam BP 154/61 mmHg  Pulse 88  Temp(Src) 97.7 F (36.5 C) (Oral)  Resp 18  Ht 5\' 2"  (1.575 m)  Wt 204 lb (92.534 kg)  BMI 37.30 kg/m2  SpO2 100%  General well-developed well-nourished female no apparent distress alert and oriented 3 Left inguinal wound has healed nicely with 3+ femoral pulse palpable Left foot well perfused with 1+ edema  Today I ordered ABIs bilaterally which revealed 0.47 on the left and 0.54 on the right     Assessment:     Doing well post replacement left common femoral artery with interposition Dacron graft because of severely diseased common femoral with near total occlusion-complete resolution of claudication symptoms     Plan:     We'll continue to follow her. Return 6 months with duplex scan of bypass in  left inguinal area and check ABIs

## 2014-01-17 ENCOUNTER — Ambulatory Visit (INDEPENDENT_AMBULATORY_CARE_PROVIDER_SITE_OTHER): Payer: 59 | Admitting: Cardiovascular Disease

## 2014-01-17 VITALS — BP 138/52 | HR 83 | Ht 62.0 in | Wt 207.0 lb

## 2014-01-17 DIAGNOSIS — E785 Hyperlipidemia, unspecified: Secondary | ICD-10-CM

## 2014-01-17 DIAGNOSIS — M7989 Other specified soft tissue disorders: Secondary | ICD-10-CM

## 2014-01-17 DIAGNOSIS — M79605 Pain in left leg: Secondary | ICD-10-CM

## 2014-01-17 DIAGNOSIS — I10 Essential (primary) hypertension: Secondary | ICD-10-CM

## 2014-01-17 DIAGNOSIS — I739 Peripheral vascular disease, unspecified: Secondary | ICD-10-CM

## 2014-01-17 DIAGNOSIS — I2510 Atherosclerotic heart disease of native coronary artery without angina pectoris: Secondary | ICD-10-CM

## 2014-01-17 DIAGNOSIS — R609 Edema, unspecified: Secondary | ICD-10-CM

## 2014-01-17 NOTE — Assessment & Plan Note (Signed)
Blood pressure is controlled on current medications. 

## 2014-01-17 NOTE — Progress Notes (Signed)
HPI  This is a pleasant 63 year old female who is here today for a followup visit regarding peripheral arterial disease.  She has known history of coronary artery disease status post drug-eluting stent placement to the LAD. She had recurrent chest pain in June, 2015 and was transferred from [facility] to [facility]. She underwent cardiac catheterization which showed severe in-stent restenosis. She had another drug-eluting stent placement. She has known history of peripheral arterial disease. Workup last year revealed an ABI of 0. 49 right; 0.39 left. I performed lower extremity angiography in June of 2014 which showed significant calcified ostial left common iliac artery stenosis extending into the distal aorta, severe left common femoral artery disease extending into ostial SFA and profunda, bilateral SFA occlusions with diffusely diseased segments proximally and heavy calcifications. There was significant disease below the knee bilaterally with functionally one-vessel runoff. At that time, she had moderate claudication worse on the right side. Thus, no intervention was performed. However, she developed severe left leg claudication.  I proceeded with angiography which showed stable iliac disease with worsening of left common/ostial SFA/ostial profunda disease with chronically occluded mid SFA.  I performed stent placement to left common iliac artery without complications. She had slight improvement in claudication. She then underwent left common femoral artery endarterectomy by Dr. Kellie Simmering in September. She had almost complete resolution of claudication since then. However, she reports progressive left leg edema and swelling with discomfort. This is more noticeable when she tries to walk more. She denies chest pain.    Allergies  Allergen Reactions  . Tape Rash     Current Outpatient Prescriptions on File Prior to Visit  Medication Sig Dispense Refill  . albuterol (PROVENTIL  HFA;VENTOLIN HFA) 108 (90 BASE) MCG/ACT inhaler Inhale 1-2 puffs into the lungs every 6 (six) hours as needed for wheezing or shortness of breath. 9 Inhaler 0  . amLODipine (NORVASC) 10 MG tablet Take 1 tablet (10 mg total) by mouth daily. 30 tablet 6  . Ascorbic Acid (VITAMIN C) 500 MG CAPS Take 1 capsule by mouth daily.     Marland Kitchen aspirin EC 81 MG tablet Take 81 mg by mouth daily.     . Calcium Carbonate-Vit D-Min (CALTRATE 600+D PLUS MINERALS) 600-800 MG-UNIT TABS Take 1 tablet by mouth 2 (two) times daily.    . clopidogrel (PLAVIX) 75 MG tablet Take 1 tablet (75 mg total) by mouth daily. 90 tablet 3  . colesevelam (WELCHOL) 625 MG tablet Take 625 mg by mouth 2 (two) times daily with a meal.    . fish oil-omega-3 fatty acids 1000 MG capsule Take 1 g by mouth every morning.    . furosemide (LASIX) 40 MG tablet Take 1 tablet (40 mg total) by mouth daily. 90 tablet 4  . hydroxypropyl methylcellulose (ISOPTO TEARS) 2.5 % ophthalmic solution Place 1 drop into both eyes 3 (three) times daily as needed (for dry eyes).    . Hypromell-Glycerin-Naphazoline 0.8-0.25-0.012 % SOLN Apply 1 application to eye daily as needed (dry eyes).     . Insulin Aspart Prot & Aspart (NOVOLOG MIX 70/30 FLEXPEN) (70-30) 100 UNIT/ML Pen Use 98 unit TID with meals 15 mL 11  . Insulin Pen Needle (PEN NEEDLES 29GX1/2") 29G X 12MM MISC 1 Container by Does not apply route 3 (three) times daily. 50 each 11  . lisinopril (PRINIVIL,ZESTRIL) 20 MG tablet Take 1 tablet (20 mg total) by mouth 2 (two) times daily. 180 tablet 4  . metoprolol succinate (TOPROL-XL)  50 MG 24 hr tablet TAKE ONE & ONE-HALF TABLETS BY MOUTH TWICE DAILY 120 tablet 1  . montelukast (SINGULAIR) 10 MG tablet Take 1 tablet (10 mg total) by mouth at bedtime. 90 tablet 4  . Multiple Vitamin (MULTIVITAMIN WITH MINERALS) TABS Take 1 tablet by mouth daily.    . nitroGLYCERIN (NITROSTAT) 0.4 MG SL tablet Place 1 tablet (0.4 mg total) under the tongue every 5 (five) minutes as  needed for chest pain (up to 3 doses). 25 tablet 4  . potassium chloride SA (K-DUR,KLOR-CON) 20 MEQ tablet Take 20 mEq by mouth daily.    . rosuvastatin (CRESTOR) 20 MG tablet Take 1 tablet (20 mg total) by mouth daily. 90 tablet 3  . traMADol (ULTRAM) 50 MG tablet Take 1 tablet (50 mg total) by mouth every 6 (six) hours as needed for moderate pain. 30 tablet 0   No current facility-administered medications on file prior to visit.     Past Medical History  Diagnosis Date  . Hypertension   . Diabetes mellitus without complication   . Hyperlipidemia   . CAD (coronary artery disease)     a. 04/2012 NSTEMI: s/p DES to LAD.  Marland Kitchen PVD (peripheral vascular disease)     a. 04/2012 ABI: R 0.49, L 0.39. Angiography 06/14: Significant ostial left common iliac artery stenosis extending into the distal aorta, severe left common femoral artery stenosis, bilateral SFA occlusion with heavy calcifications. Functionally, one-vessel runoff bilaterally below the knee  . Chronic diastolic CHF (congestive heart failure)   . Leukocytosis   . Morbid obesity   . NSTEMI (non-ST elevated myocardial infarction) 04/27/2012  . Dysrhythmia   . Shortness of breath   . PONV (postoperative nausea and vomiting)   . Environmental and seasonal allergies      Past Surgical History  Procedure Laterality Date  . 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 Lad stent    . Iliac artery stent Left 08/31/2013  . Cardiac catheterization      stents X 2  . Endarterectomy femoral Left 11/02/2013    Procedure: RESECTION LEFT COMMON FEMORAL ARTERY AND INSERTION OF INTERPOSITION 46mm HEMASHIELD GRAFT FROM LEFT EXTERNAL  ILIAC ARTERY TO LEFT PROFUNDA  FEMORIS ARTERY;  Surgeon: Mal Misty, MD;  Location: [facility] OR;  Service: Vascular;  Laterality: Left;     Family History  Problem Relation Age of Onset  . Diabetes Mother   . Hyperlipidemia Mother   . Hypertension Mother   . Peripheral vascular disease Mother      History     Social History  . Marital Status: Married    Spouse Name: N/A    Number of Children: N/A  . Years of Education: N/A   Occupational History  . Not on file.   Social History Main Topics  . Smoking status: Former Smoker    Quit date: 02/17/2001  . Smokeless tobacco: Former Systems developer    Quit date: 04/20/2001  . Alcohol Use: No  . Drug Use: No  . Sexual Activity: Not on file   Other Topics Concern  . Not on file   Social History Narrative      PHYSICAL EXAM   BP 138/52 mmHg  Pulse 83  Ht 5\' 2"  (1.575 m)  Wt 207 lb (93.895 kg)  BMI 37.85 kg/m2 Constitutional: She is oriented to person, place, and time. She appears well-developed and well-nourished. No distress.  HENT: No nasal discharge.  Head: Normocephalic and atraumatic.  Eyes: Pupils are equal and round. Right eye exhibits no discharge. Left eye exhibits no discharge.  Neck: Normal range of motion. Neck supple. No JVD present. No thyromegaly present.  Cardiovascular: Normal rate, regular rhythm, normal heart sounds. Exam reveals no gallop and no friction rub. No murmur heard.  Pulmonary/Chest: Effort normal and breath sounds normal. No stridor. No respiratory distress. She has no wheezes. She has no rales. She exhibits no tenderness.  Abdominal: Soft. Bowel sounds are normal. She exhibits no distension. There is no tenderness. There is no rebound and no guarding.  Musculoskeletal: Normal range of motion. She exhibits +2 edema involving the left leg with mild tenderness.   Neurological: She is alert and oriented to person, place, and time. Coordination normal.  Skin: Skin is warm and dry. No rash noted. She is not diaphoretic. No erythema. No pallor.  Psychiatric: She has a normal mood and affect. Her behavior is normal. Judgment and thought content normal.  Vascular: Femoral pulses  diminished bilaterally. Distal pulses are not palpable.    ASSESSMENT AND PLAN

## 2014-01-17 NOTE — Assessment & Plan Note (Signed)
She had almost complete resolution of claudication after left common iliac artery stenting and left common femoral artery endarterectomy. Continue medical therapy. She is going to follow-up with Dr. Kellie Simmering for repeat noninvasive studies.

## 2014-01-17 NOTE — Patient Instructions (Signed)
Your physician has requested that you have a left lower extremity venous duplex TODAY. This test is an ultrasound of the veins in the leg. It looks at venous blood flow that carries blood from the heart to the leg. Allow one hour for a Lower Venous exam. There are no restrictions or special instructions.  Your physician wants you to follow-up in: 6 months with Dr. Fletcher [name]. You will receive a reminder letter in the mail two months in advance. If you don't receive a letter, please call our office to schedule the follow-up appointment.  Your physician recommends that you continue on your current medications as directed. Please refer to the Current Medication list given to you today.

## 2014-01-17 NOTE — Assessment & Plan Note (Signed)
She has no symptoms of angina. Continue medical therapy. Given her extensive coronary and peripheral vascular disease, I will likely keep her on long-term dual antiplatelet therapy.

## 2014-01-17 NOTE — Assessment & Plan Note (Signed)
This is likely due to improved arterial flow and the fact that the patient is more active. She likely has chronic venous insufficiency. However, given the associated discomfort, I think we have to exclude deep vein thrombosis. Thus, I requested left lower extremity venous duplex. If this comes back negative, I advised her to elevate her legs frequently during the day. Low pressure support stockings can be considered.

## 2014-01-17 NOTE — Assessment & Plan Note (Signed)
Lab Results  Component Value Date   CHOL 179 09/16/2012   HDL 81 11/28/2013   LDLCALC 107* 11/28/2013   TRIG 136 11/28/2013   CHOLHDL 2.7 11/28/2013   Continue treatment with rosuvastatin with a target LDL of less than 70.

## 2014-01-20 ENCOUNTER — Ambulatory Visit (HOSPITAL_COMMUNITY): Payer: 59 | Attending: Cardiovascular Disease | Admitting: *Deleted

## 2014-01-20 DIAGNOSIS — M79605 Pain in left leg: Secondary | ICD-10-CM | POA: Diagnosis not present

## 2014-01-20 DIAGNOSIS — R609 Edema, unspecified: Secondary | ICD-10-CM

## 2014-01-20 NOTE — Progress Notes (Signed)
Venous Duplex Lower Left Performed 

## 2014-01-26 ENCOUNTER — Encounter (HOSPITAL_COMMUNITY): Payer: Self-pay | Admitting: Cardiology

## 2014-02-08 ENCOUNTER — Other Ambulatory Visit: Payer: Self-pay | Admitting: Family

## 2014-02-08 ENCOUNTER — Telehealth: Payer: Self-pay | Admitting: Family

## 2014-02-08 NOTE — Telephone Encounter (Signed)
Appt given for tomorrow per patients request 

## 2014-02-09 ENCOUNTER — Ambulatory Visit: Payer: 59 | Admitting: Nurse Practitioner

## 2014-02-14 ENCOUNTER — Other Ambulatory Visit: Payer: Self-pay | Admitting: Family

## 2014-03-23 ENCOUNTER — Other Ambulatory Visit: Payer: Self-pay | Admitting: Family Medicine

## 2014-03-31 ENCOUNTER — Telehealth: Payer: Self-pay | Admitting: Family

## 2014-03-31 MED ORDER — INSULIN ASPART PROT & ASPART (70-30 MIX) 100 UNIT/ML PEN: PEN_INJECTOR | SUBCUTANEOUS | Status: DC

## 2014-03-31 NOTE — Telephone Encounter (Signed)
Patient aware and samples left in refrigerator in lab.

## 2014-04-02 IMAGING — CR DG CHEST 1V PORT
1 series · 1 of 1 positions shown · non-contrast
Comparison: 04/20/2012

CLINICAL DATA: Chest pain

PORTABLE CHEST - 1 VIEW

[AP]
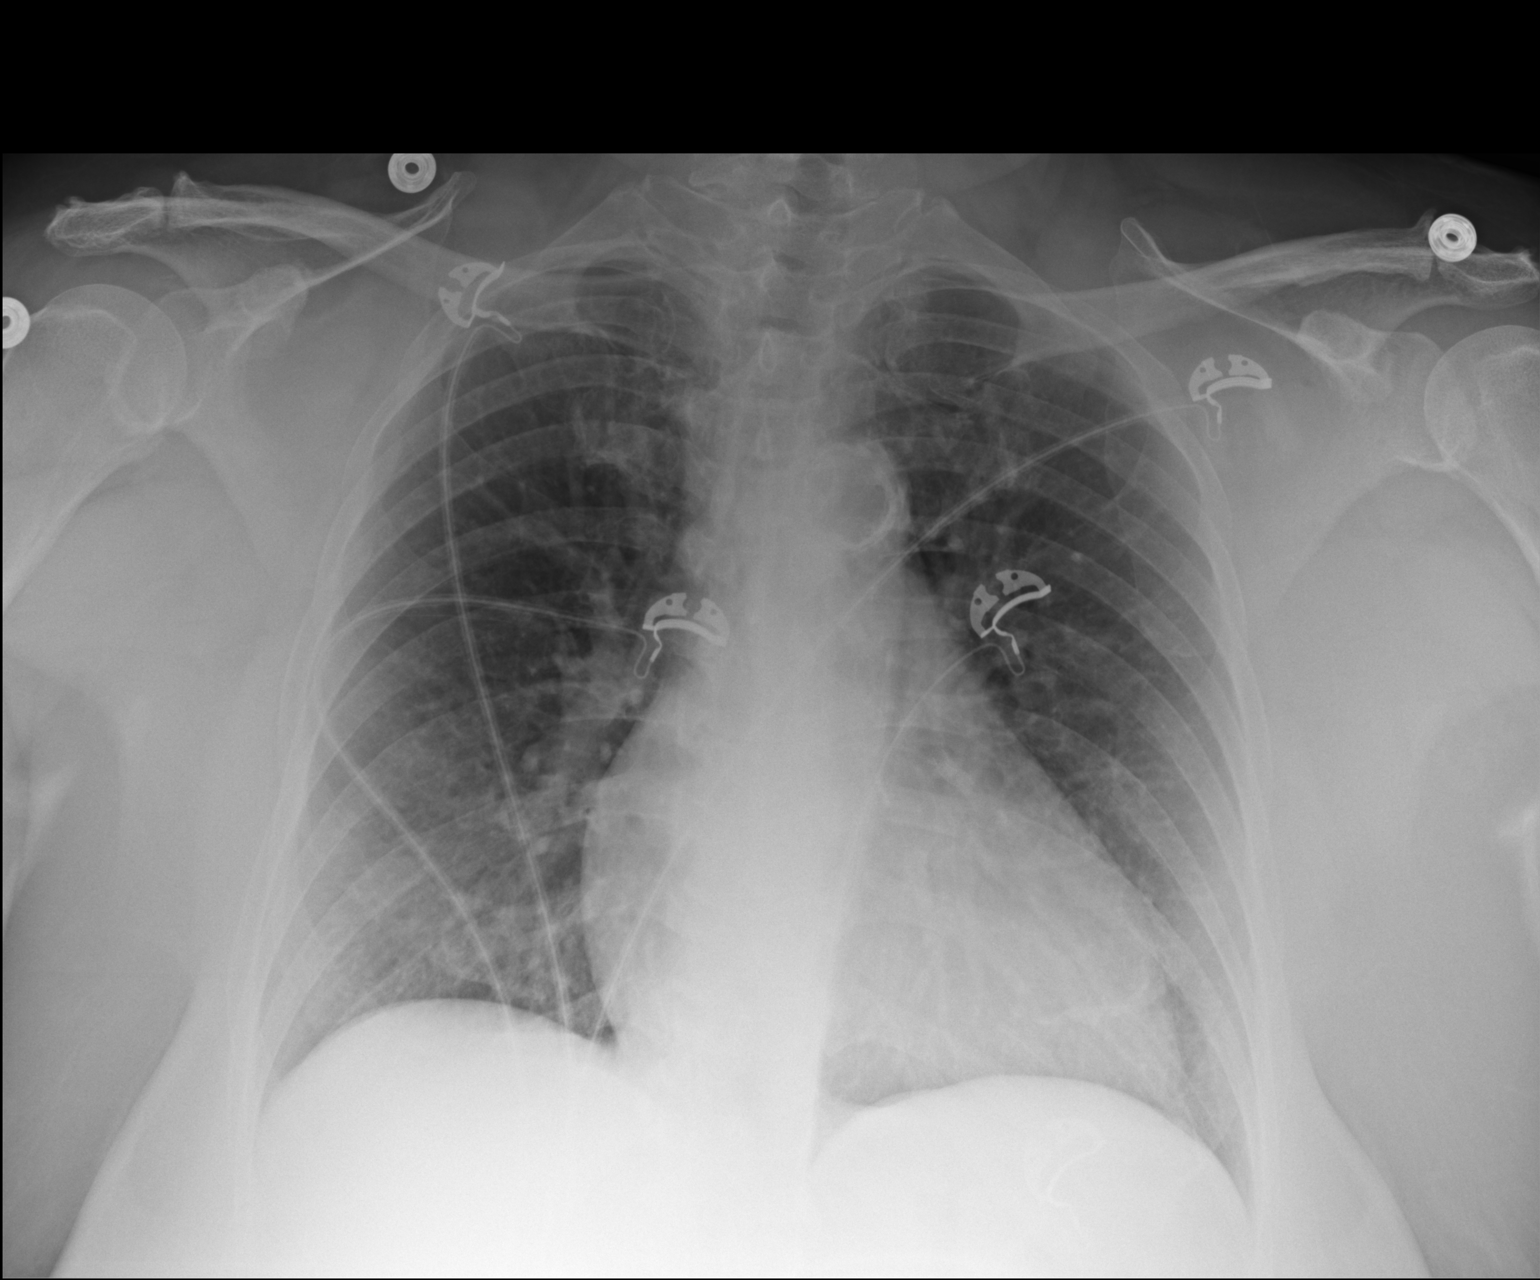

[1 of 1 positions shown; findings below may reference images not displayed]

FINDINGS: Heart size upper normal to mildly enlarged.  Aortic
atherosclerosis.  Interstitial prominence.  No confluent airspace
opacity.  No pleural effusion or pneumothorax.  No acute osseous
finding.  Bilateral acromioclavicular degenerative change.
IMPRESSION: Heart size upper normal to mildly enlarged.  Interstitial
prominence may be chronic, or reflect interstitial edema versus
atypical infection in the appropriate clinical setting.

## 2014-04-03 ENCOUNTER — Ambulatory Visit: Payer: 59 | Admitting: Family

## 2014-04-05 ENCOUNTER — Encounter: Payer: Self-pay | Admitting: Family

## 2014-04-05 ENCOUNTER — Ambulatory Visit (INDEPENDENT_AMBULATORY_CARE_PROVIDER_SITE_OTHER): Payer: 59 | Admitting: Family

## 2014-04-05 VITALS — BP 129/58 | HR 64 | Temp 97.8°F | Ht 62.0 in | Wt 205.0 lb

## 2014-04-05 DIAGNOSIS — I10 Essential (primary) hypertension: Secondary | ICD-10-CM

## 2014-04-05 DIAGNOSIS — I739 Peripheral vascular disease, unspecified: Secondary | ICD-10-CM

## 2014-04-05 DIAGNOSIS — E1151 Type 2 diabetes mellitus with diabetic peripheral angiopathy without gangrene: Secondary | ICD-10-CM

## 2014-04-05 DIAGNOSIS — R5383 Other fatigue: Secondary | ICD-10-CM

## 2014-04-05 DIAGNOSIS — I2510 Atherosclerotic heart disease of native coronary artery without angina pectoris: Secondary | ICD-10-CM

## 2014-04-05 DIAGNOSIS — I5032 Chronic diastolic (congestive) heart failure: Secondary | ICD-10-CM

## 2014-04-05 DIAGNOSIS — E785 Hyperlipidemia, unspecified: Secondary | ICD-10-CM

## 2014-04-05 DIAGNOSIS — N939 Abnormal uterine and vaginal bleeding, unspecified: Secondary | ICD-10-CM

## 2014-04-05 LAB — POCT GLYCOSYLATED HEMOGLOBIN (HGB A1C): HEMOGLOBIN A1C: 8.2

## 2014-04-05 MED ORDER — INSULIN ASPART PROT & ASPART (70-30 MIX) 100 UNIT/ML PEN: PEN_INJECTOR | SUBCUTANEOUS | Status: DC

## 2014-04-05 NOTE — Progress Notes (Signed)
Subjective:    Patient ID: Leah Ferrell, female    DOB: 1950-12-03, 64 y.o.   MRN: [id number]  Diabetes She presents for her follow-up diabetic visit. She has type 2 diabetes mellitus. Her disease course has been stable. There are no hypoglycemic associated symptoms. Pertinent negatives for hypoglycemia include no headaches. Associated symptoms include fatigue and foot paresthesias. Pertinent negatives for diabetes include no blurred vision, no foot ulcerations and no visual change. There are no hypoglycemic complications. Diabetic complications include heart disease and peripheral neuropathy. Pertinent negatives for diabetic complications include no CVA or nephropathy. Risk factors for coronary artery disease include dyslipidemia, diabetes mellitus, hypertension, obesity and post-menopausal. Current diabetic treatment includes insulin injections. She is compliant with treatment all of the time. Her weight is stable. She is following a generally healthy diet. Her breakfast blood glucose range is generally 110-130 mg/dl. An ACE inhibitor/angiotensin II receptor blocker is being taken. She sees a podiatrist.Eye exam is current.  Hypertension This is a chronic problem. The current episode started more than 1 year ago. The problem has been resolved since onset. The problem is controlled. Associated symptoms include palpitations. Pertinent negatives include no blurred vision, headaches, peripheral edema or shortness of breath. Risk factors for coronary artery disease include diabetes mellitus, dyslipidemia, family history, post-menopausal state and obesity. Past treatments include beta blockers, calcium channel blockers and ACE inhibitors. The current treatment provides moderate improvement. Hypertensive end-organ damage includes CAD/MI. There is no history of CVA, heart failure or a thyroid problem.  Hyperlipidemia This is a chronic problem. The current episode started more than 1 year ago. The problem is  uncontrolled. Recent lipid tests were reviewed and are high. Exacerbating diseases include diabetes and obesity. She has no history of hypothyroidism. Factors aggravating her hyperlipidemia include beta blockers. Associated symptoms include leg pain. Pertinent negatives include no shortness of breath. Current antihyperlipidemic treatment includes statins. The current treatment provides moderate improvement of lipids. Compliance problems include adherence to diet and medication cost.  Risk factors for coronary artery disease include diabetes mellitus, dyslipidemia, family history, hypertension, obesity and post-menopausal.   *Pt states she has started bleeding every month-Very light. Pt states she went to through menopause at age 26 years old. Pt states she has been bleeding very light every first of the month. Pt denies any pain or dysuria. Pt has not have a pap in several years.    Review of Systems  Constitutional: Positive for fatigue.  HENT: Negative.   Eyes: Negative.  Negative for blurred vision.  Respiratory: Negative.  Negative for shortness of breath.   Cardiovascular: Positive for palpitations.  Gastrointestinal: Negative.   Endocrine: Negative.   Genitourinary: Negative.   Musculoskeletal: Negative.   Neurological: Negative.  Negative for headaches.  Hematological: Negative.   Psychiatric/Behavioral: Negative.   All other systems reviewed and are negative.      Objective:   Physical Exam  Constitutional: She is oriented to person, place, and time. She appears well-developed and well-nourished. No distress.  HENT:  Head: Normocephalic and atraumatic.  Right Ear: External ear normal.  Left Ear: External ear normal.  Nose: Nose normal.  Mouth/Throat: Oropharynx is clear and moist.  Eyes: Pupils are equal, round, and reactive to light.  Neck: Normal range of motion. Neck supple. No thyromegaly present.  Cardiovascular: Normal rate, regular rhythm, normal heart sounds and  intact distal pulses.   No murmur heard. Pulmonary/Chest: Effort normal and breath sounds normal. No respiratory distress. She has no wheezes.  Abdominal: Soft.  Bowel sounds are normal. She exhibits no distension. There is no tenderness.  Musculoskeletal: Normal range of motion. She exhibits edema (mild edema in LLE). She exhibits no tenderness.  Neurological: She is alert and oriented to person, place, and time. She has normal reflexes. No cranial nerve deficit.  Skin: Skin is warm and dry.  Psychiatric: She has a normal mood and affect. Her behavior is normal. Judgment and thought content normal.  Vitals reviewed.     BP 129/58 mmHg  Pulse 64  Temp(Src) 97.8 F (36.6 C) (Oral)  Ht 5' 2" (1.575 m)  Wt 205 lb (92.987 kg)  BMI 37.49 kg/m2     Assessment & Plan:  1. Essential hypertension - CMP14+EGFR  2. Diabetes mellitus with peripheral vascular disease - POCT glycosylated hemoglobin (Hb A1C) - CMP14+EGFR - Thyroid Panel With TSH - insulin aspart protamine - aspart (NOVOLOG MIX 70/30 FLEXPEN) (70-30) 100 UNIT/ML FlexPen; Use 70 unit TID with meals  Dispense: 15 mL; Refill: 0  3. Coronary artery disease involving native coronary artery of native heart without angina pectoris - CMP14+EGFR  4. Hyperlipidemia - CMP14+EGFR - Lipid panel  5. PVD (peripheral vascular disease) - CMP14+EGFR  6. Peripheral vascular disease - CMP14+EGFR  7. Chronic diastolic heart failure - SNK53+ZJQB  8. Other fatigue - Thyroid Panel With TSH - Vit D  25 hydroxy (rtn osteoporosis monitoring)   Continue all meds Labs pending Health Maintenance reviewed- Pt to schedule pap asap!! Pt to schedule mammogram-hemoccult cards given to patient with directions Diet and exercise encouraged RTO 3 months  Evelina Dun, FNP

## 2014-04-05 NOTE — Patient Instructions (Signed)

## 2014-04-06 ENCOUNTER — Other Ambulatory Visit: Payer: Self-pay | Admitting: General Practice

## 2014-04-06 LAB — ANEMIA PROFILE B
Basophils Absolute: 0 10*3/uL (ref 0.0–0.2)
Basos: 0 %
Eos: 4 %
Eosinophils Absolute: 0.4 10*3/uL (ref 0.0–0.4)
Ferritin: 92 ng/mL (ref 15–150)
Folate: 6.7 ng/mL (ref >3.0)
HCT: 44.4 % (ref 34.0–46.6)
HEMOGLOBIN: 14.9 g/dL (ref 11.1–15.9)
IMMATURE GRANULOCYTES: 0 %
Immature Grans (Abs): 0 10*3/uL (ref 0.0–0.1)
Iron Saturation: 21 % (ref 15–55)
Iron: 73 ug/dL (ref 27–139)
LYMPHS: 31 %
Lymphocytes Absolute: 3.4 10*3/uL — ABNORMAL HIGH (ref 0.7–3.1)
MCH: 30.3 pg (ref 26.6–33.0)
MCHC: 33.6 g/dL (ref 31.5–35.7)
MCV: 90 fL (ref 79–97)
MONOCYTES: 5 %
Monocytes Absolute: 0.5 10*3/uL (ref 0.1–0.9)
NEUTROS ABS: 6.7 10*3/uL (ref 1.4–7.0)
Neutrophils Relative %: 60 %
Platelets: 313 10*3/uL (ref 150–379)
RBC: 4.91 x10E6/uL (ref 3.77–5.28)
RDW: 15.1 % (ref 12.3–15.4)
Retic Ct Pct: 1.6 % (ref 0.6–2.6)
Total Iron Binding Capacity: 346 ug/dL (ref 250–450)
UIBC: 273 ug/dL (ref 118–369)
VITAMIN B 12: 793 pg/mL (ref 211–946)
WBC: 11.1 10*3/uL — AB (ref 3.4–10.8)

## 2014-04-06 LAB — CMP14+EGFR
ALT: 27 IU/L (ref 0–32)
AST: 23 IU/L (ref 0–40)
Albumin/Globulin Ratio: 1.4 (ref 1.1–2.5)
Albumin: 4.7 g/dL (ref 3.6–4.8)
Alkaline Phosphatase: 84 IU/L (ref 39–117)
BUN / CREAT RATIO: 11 (ref 11–26)
BUN: 8 mg/dL (ref 8–27)
Bilirubin Total: <0.2 mg/dL (ref 0.0–1.2)
CHLORIDE: 97 mmol/L (ref 97–108)
CO2: 26 mmol/L (ref 18–29)
Calcium: 10.2 mg/dL (ref 8.7–10.3)
Creatinine, Ser: 0.7 mg/dL (ref 0.57–1.00)
GFR calc Af Amer: 107 mL/min/{1.73_m2} (ref >59)
GFR calc non Af Amer: 93 mL/min/{1.73_m2} (ref >59)
Globulin, Total: 3.4 g/dL (ref 1.5–4.5)
Glucose: 106 mg/dL — ABNORMAL HIGH (ref 65–99)
POTASSIUM: 4.5 mmol/L (ref 3.5–5.2)
Sodium: 142 mmol/L (ref 134–144)
Total Protein: 8.1 g/dL (ref 6.0–8.5)

## 2014-04-06 LAB — THYROID PANEL WITH TSH
Free Thyroxine Index: 2.2 (ref 1.2–4.9)
T3 Uptake Ratio: 24 % (ref 24–39)
T4, Total: 9 ug/dL (ref 4.5–12.0)
TSH: 1.55 u[IU]/mL (ref 0.450–4.500)

## 2014-04-06 LAB — VITAMIN D 25 HYDROXY (VIT D DEFICIENCY, FRACTURES): VIT D 25 HYDROXY: 37.5 ng/mL (ref 30.0–100.0)

## 2014-04-06 LAB — LIPID PANEL
CHOLESTEROL TOTAL: 182 mg/dL (ref 100–199)
Chol/HDL Ratio: 2.4 ratio units (ref 0.0–4.4)
HDL: 76 mg/dL (ref >39)
LDL Calculated: 77 mg/dL (ref 0–99)
Triglycerides: 146 mg/dL (ref 0–149)
VLDL CHOLESTEROL CAL: 29 mg/dL (ref 5–40)

## 2014-04-11 ENCOUNTER — Telehealth: Payer: Self-pay | Admitting: *Deleted

## 2014-04-11 NOTE — Telephone Encounter (Signed)
Aware of lab results and will schedule with T.Eckerd for recheck.

## 2014-04-11 NOTE — Telephone Encounter (Signed)
-----   Message from [facility] [place] sent at 04/07/2014  8:30 PM EST ----- [id number] elevated- need to increase 70/30 insulin to 75u TID- probally would be good to see T. Eckard Kidney and liver function stable Cholesterol looks good thyroid panel normal Vitamin d normal Cbc stable Follow up in 1 month if does not make appointment yo see t.Eckard so we can recheck diabetes

## 2014-04-18 ENCOUNTER — Other Ambulatory Visit: Payer: Self-pay | Admitting: Family Medicine

## 2014-04-27 ENCOUNTER — Telehealth: Payer: Self-pay | Admitting: Family

## 2014-04-27 NOTE — Telephone Encounter (Signed)
Patient did not want to cancel appointment on the 15th and patient just wanted that appointment kept in the system. Cathy aware and appointment saved.

## 2014-05-02 ENCOUNTER — Telehealth: Payer: Self-pay | Admitting: Family

## 2014-05-02 ENCOUNTER — Ambulatory Visit (INDEPENDENT_AMBULATORY_CARE_PROVIDER_SITE_OTHER): Payer: 59 | Admitting: Family

## 2014-05-02 ENCOUNTER — Other Ambulatory Visit: Payer: 59 | Admitting: Family

## 2014-05-02 ENCOUNTER — Encounter: Payer: Self-pay | Admitting: Family

## 2014-05-02 VITALS — BP 132/58 | HR 64 | Temp 98.1°F | Ht 62.0 in | Wt 207.8 lb

## 2014-05-02 DIAGNOSIS — E1151 Type 2 diabetes mellitus with diabetic peripheral angiopathy without gangrene: Secondary | ICD-10-CM | POA: Diagnosis not present

## 2014-05-02 DIAGNOSIS — I5032 Chronic diastolic (congestive) heart failure: Secondary | ICD-10-CM | POA: Diagnosis not present

## 2014-05-02 DIAGNOSIS — I739 Peripheral vascular disease, unspecified: Secondary | ICD-10-CM | POA: Diagnosis not present

## 2014-05-02 DIAGNOSIS — Z01419 Encounter for gynecological examination (general) (routine) without abnormal findings: Secondary | ICD-10-CM | POA: Diagnosis not present

## 2014-05-02 DIAGNOSIS — I10 Essential (primary) hypertension: Secondary | ICD-10-CM | POA: Diagnosis not present

## 2014-05-02 DIAGNOSIS — I214 Non-ST elevation (NSTEMI) myocardial infarction: Secondary | ICD-10-CM

## 2014-05-02 DIAGNOSIS — I2510 Atherosclerotic heart disease of native coronary artery without angina pectoris: Secondary | ICD-10-CM

## 2014-05-02 DIAGNOSIS — E785 Hyperlipidemia, unspecified: Secondary | ICD-10-CM | POA: Diagnosis not present

## 2014-05-02 LAB — POCT GLYCOSYLATED HEMOGLOBIN (HGB A1C): Hemoglobin A1C: 8.7

## 2014-05-02 MED ORDER — CANAGLIFLOZIN 100 MG PO TABS: 100.0000 mg | ORAL_TABLET | Freq: Every day | ORAL | Status: DC

## 2014-05-02 NOTE — Progress Notes (Signed)
Subjective:    Patient ID: Leah Ferrell, female    DOB: 1950-03-29, 64 y.o.   MRN: [id number] Pt presents to the office today for CPE with pap. Gynecologic Exam Pertinent negatives include no headaches.  Diabetes She presents for her follow-up diabetic visit. She has type 2 diabetes mellitus. Her disease course has been stable. There are no hypoglycemic associated symptoms. Pertinent negatives for hypoglycemia include no headaches. Associated symptoms include fatigue and foot paresthesias. Pertinent negatives for diabetes include no blurred vision, no foot ulcerations and no visual change. There are no hypoglycemic complications. Diabetic complications include heart disease and peripheral neuropathy. Pertinent negatives for diabetic complications include no CVA or nephropathy. Risk factors for coronary artery disease include dyslipidemia, diabetes mellitus, hypertension, obesity and post-menopausal. Current diabetic treatment includes insulin injections. She is compliant with treatment all of the time. Her weight is stable. She is following a generally healthy diet. Her breakfast blood glucose range is generally 130-140 mg/dl. An ACE inhibitor/angiotensin II receptor blocker is being taken. She sees a podiatrist.Eye exam is current.  Hypertension This is a chronic problem. The current episode started more than 1 year ago. The problem has been resolved since onset. The problem is controlled. Associated symptoms include palpitations. Pertinent negatives include no blurred vision, headaches, peripheral edema or shortness of breath. Risk factors for coronary artery disease include diabetes mellitus, dyslipidemia, family history, post-menopausal state and obesity. Past treatments include beta blockers, calcium channel blockers and ACE inhibitors. The current treatment provides moderate improvement. Hypertensive end-organ damage includes CAD/MI. There is no history of CVA, heart failure or a thyroid problem.    Hyperlipidemia This is a chronic problem. The current episode started more than 1 year ago. The problem is uncontrolled. Recent lipid tests were reviewed and are high. Exacerbating diseases include diabetes and obesity. She has no history of hypothyroidism. Factors aggravating her hyperlipidemia include beta blockers. Associated symptoms include leg pain. Pertinent negatives include no shortness of breath. Current antihyperlipidemic treatment includes statins. The current treatment provides moderate improvement of lipids. Compliance problems include adherence to diet and medication cost.  Risk factors for coronary artery disease include diabetes mellitus, dyslipidemia, family history, hypertension, obesity and post-menopausal.      Review of Systems  Constitutional: Positive for fatigue.  HENT: Negative.   Eyes: Negative.  Negative for blurred vision.  Respiratory: Negative.  Negative for shortness of breath.   Cardiovascular: Positive for palpitations.  Gastrointestinal: Negative.   Endocrine: Negative.   Genitourinary: Negative.   Musculoskeletal: Negative.   Neurological: Negative.  Negative for headaches.  Hematological: Negative.   Psychiatric/Behavioral: Negative.   All other systems reviewed and are negative.      Objective:   Physical Exam  Constitutional: She is oriented to person, place, and time. She appears well-developed and well-nourished. No distress.  HENT:  Head: Normocephalic and atraumatic.  Right Ear: External ear normal.  Mouth/Throat: Oropharynx is clear and moist.  Eyes: Pupils are equal, round, and reactive to light.  Neck: Normal range of motion. Neck supple. No thyromegaly present.  Cardiovascular: Normal rate, regular rhythm, normal heart sounds and intact distal pulses.   No murmur heard. Pulmonary/Chest: Effort normal and breath sounds normal. No respiratory distress. She has no wheezes. Right breast exhibits no inverted nipple, no mass, no nipple  discharge, no skin change and no tenderness. Left breast exhibits no inverted nipple, no mass, no nipple discharge, no skin change and no tenderness. Breasts are symmetrical.  Abdominal: Soft. Bowel sounds are normal.  She exhibits no distension. There is no tenderness.  Genitourinary: Vagina normal.  Bimanual exam- no adnexal masses or tenderness, ovaries nonpalpable   Cervix - No discharge Lining erythemas, atrophy, and bleeding easily   Musculoskeletal: Normal range of motion. She exhibits no edema or tenderness.  Neurological: She is alert and oriented to person, place, and time. She has normal reflexes. No cranial nerve deficit.  Skin: Skin is warm and dry.  Psychiatric: She has a normal mood and affect. Her behavior is normal. Judgment and thought content normal.  Vitals reviewed.   BP 132/58 mmHg  Pulse 64  Temp(Src) 98.1 F (36.7 C) (Oral)  Ht 5' 2"  (1.575 m)  Wt 207 lb 12.8 oz (94.257 kg)  BMI 38.00 kg/m2  LMP  (Approximate)       Assessment & Plan:  1. PVD (peripheral vascular disease) - CMP14+EGFR  2. Peripheral vascular disease  3. Essential hypertension - CMP14+EGFR  4. Hyperlipidemia - CMP14+EGFR  5. Coronary artery disease involving native coronary artery of native heart without angina pectoris - CMP14+EGFR  6. Diabetes mellitus with peripheral vascular disease - POCT glycosylated hemoglobin (Hb A1C) - CMP14+EGFR  7. Chronic diastolic heart failure - XHF41+SELT  8. Encounter for routine gynecological examination - Pap IG (Image Guided)   Continue all meds Labs pending Health Maintenance reviewed Diet and exercise encouraged RTO 3 months  Evelina Dun, FNP

## 2014-05-02 NOTE — Telephone Encounter (Signed)
Please review and advise.

## 2014-05-02 NOTE — Addendum Note (Signed)
Addended by: Earlene Plater on: 05/02/2014 12:47 PM   Modules accepted: Miquel Dunn

## 2014-05-02 NOTE — Patient Instructions (Signed)
Diabetes Mellitus and Food It is important for you to manage your blood sugar (glucose) level. Your blood glucose level can be greatly affected by what you eat. Eating healthier foods in the appropriate amounts throughout the day at about the same time each day will help you control your blood glucose level. It can also help slow or prevent worsening of your diabetes mellitus. Healthy eating may even help you improve the level of your blood pressure and reach or maintain a healthy weight.  HOW CAN FOOD AFFECT ME? Carbohydrates Carbohydrates affect your blood glucose level more than any other type of food. Your dietitian will help you determine how many carbohydrates to eat at each meal and teach you how to count carbohydrates. Counting carbohydrates is important to keep your blood glucose at a healthy level, especially if you are using insulin or taking certain medicines for diabetes mellitus. Alcohol Alcohol can cause sudden decreases in blood glucose (hypoglycemia), especially if you use insulin or take certain medicines for diabetes mellitus. Hypoglycemia can be a life-threatening condition. Symptoms of hypoglycemia (sleepiness, dizziness, and disorientation) are similar to symptoms of having too much alcohol.  If your health care provider has given you approval to drink alcohol, do so in moderation and use the following guidelines:  Women should not have more than one drink per day, and men should not have more than two drinks per day. One drink is equal to:  12 oz of beer.  5 oz of wine.  1 oz of hard liquor.  Do not drink on an empty stomach.  Keep yourself hydrated. Have water, diet soda, or unsweetened iced tea.  Regular soda, juice, and other mixers might contain a lot of carbohydrates and should be counted. WHAT FOODS ARE NOT RECOMMENDED? As you make food choices, it is important to remember that all foods are not the same. Some foods have fewer nutrients per serving than other  foods, even though they might have the same number of calories or carbohydrates. It is difficult to get your body what it needs when you eat foods with fewer nutrients. Examples of foods that you should avoid that are high in calories and carbohydrates but low in nutrients include:  Trans fats (most processed foods list trans fats on the Nutrition Facts label).  Regular soda.  Juice.  Candy.  Sweets, such as cake, pie, doughnuts, and cookies.  Fried foods. WHAT FOODS CAN I EAT? Have nutrient-rich foods, which will nourish your body and keep you healthy. The food you should eat also will depend on several factors, including:  The calories you need.  The medicines you take.  Your weight.  Your blood glucose level.  Your blood pressure level.  Your cholesterol level. You also should eat a variety of foods, including:  Protein, such as meat, poultry, fish, tofu, nuts, and seeds (lean animal proteins are best).  Fruits.  Vegetables.  Dairy products, such as milk, cheese, and yogurt (low fat is best).  Breads, grains, pasta, cereal, rice, and beans.  Fats such as olive oil, trans fat-free margarine, canola oil, avocado, and olives. DOES EVERYONE WITH DIABETES MELLITUS HAVE THE SAME MEAL PLAN? Because every person with diabetes mellitus is different, there is not one meal plan that works for everyone. It is very important that you meet with a dietitian who will help you create a meal plan that is just right for you. Document Released: 10/31/2004 Document Revised: 02/08/2013 Document Reviewed: 12/31/2012 [employer] Patient Information 2015 [employer]. This   information is not intended to replace advice given to you by your health care provider. Make sure you discuss any questions you have with your health care provider. Health Maintenance Adopting a healthy lifestyle and getting preventive care can go a long way to promote health and wellness. Talk with your health care  provider about what schedule of regular examinations is right for you. This is a good chance for you to check in with your provider about disease prevention and staying healthy. In between checkups, there are plenty of things you can do on your own. Experts have done a lot of research about which lifestyle changes and preventive measures are most likely to keep you healthy. Ask your health care provider for more information. WEIGHT AND DIET  Eat a healthy diet  Be sure to include plenty of vegetables, fruits, low-fat dairy products, and lean protein.  Do not eat a lot of foods high in solid fats, added sugars, or salt.  Get regular exercise. This is one of the most important things you can do for your health.  Most adults should exercise for at least 150 minutes each week. The exercise should increase your heart rate and make you sweat (moderate-intensity exercise).  Most adults should also do strengthening exercises at least twice a week. This is in addition to the moderate-intensity exercise.  Maintain a healthy weight  Body mass index (BMI) is a measurement that can be used to identify possible weight problems. It estimates body fat based on height and weight. Your health care provider can help determine your BMI and help you achieve or maintain a healthy weight.  For females 49 years of age and older:   A BMI below 18.5 is considered underweight.  A BMI of 18.5 to 24.9 is normal.  A BMI of 25 to 29.9 is considered overweight.  A BMI of 30 and above is considered obese.  Watch levels of cholesterol and blood lipids  You should start having your blood tested for lipids and cholesterol at 63 years of age, then have this test every 5 years.  You may need to have your cholesterol levels checked more often if:  Your lipid or cholesterol levels are high.  You are older than 63 years of age.  You are at high risk for heart disease.  CANCER SCREENING   Lung Cancer  Lung  cancer screening is recommended for adults 45-18 years old who are at high risk for lung cancer because of a history of smoking.  A yearly low-dose CT scan of the lungs is recommended for people who:  Currently smoke.  Have quit within the past 15 years.  Have at least a 30-pack-year history of smoking. A pack year is smoking an average of one pack of cigarettes a day for 1 year.  Yearly screening should continue until it has been 15 years since you quit.  Yearly screening should stop if you develop a health problem that would prevent you from having lung cancer treatment.  Breast Cancer  Practice breast self-awareness. This means understanding how your breasts normally appear and feel.  It also means doing regular breast self-exams. Let your health care provider know about any changes, no matter how small.  If you are in your 20s or 30s, you should have a clinical breast exam (CBE) by a health care provider every 1-3 years as part of a regular health exam.  If you are 30 or older, have a CBE every year. Also consider having  a breast X-ray (mammogram) every year.  If you have a family history of breast cancer, talk to your health care provider about genetic screening.  If you are at high risk for breast cancer, talk to your health care provider about having an MRI and a mammogram every year.  Breast cancer gene (BRCA) assessment is recommended for women who have family members with BRCA-related cancers. BRCA-related cancers include:  Breast.  Ovarian.  Tubal.  Peritoneal cancers.  Results of the assessment will determine the need for genetic counseling and BRCA1 and BRCA2 testing. Cervical Cancer Routine pelvic examinations to screen for cervical cancer are no longer recommended for nonpregnant women who are considered low risk for cancer of the pelvic organs (ovaries, uterus, and vagina) and who do not have symptoms. A pelvic examination may be necessary if you have symptoms  including those associated with pelvic infections. Ask your health care provider if a screening pelvic exam is right for you.   The Pap test is the screening test for cervical cancer for women who are considered at risk.  If you had a hysterectomy for a problem that was not cancer or a condition that could lead to cancer, then you no longer need Pap tests.  If you are older than 65 years, and you have had normal Pap tests for the past 10 years, you no longer need to have Pap tests.  If you have had past treatment for cervical cancer or a condition that could lead to cancer, you need Pap tests and screening for cancer for at least 20 years after your treatment.  If you no longer get a Pap test, assess your risk factors if they change (such as having a new sexual partner). This can affect whether you should start being screened again.  Some women have medical problems that increase their chance of getting cervical cancer. If this is the case for you, your health care provider may recommend more frequent screening and Pap tests.  The human papillomavirus (HPV) test is another test that may be used for cervical cancer screening. The HPV test looks for the virus that can cause cell changes in the cervix. The cells collected during the Pap test can be tested for HPV.  The HPV test can be used to screen women 28 years of age and older. Getting tested for HPV can extend the interval between normal Pap tests from three to five years.  An HPV test also should be used to screen women of any age who have unclear Pap test results.  After 63 years of age, women should have HPV testing as often as Pap tests.  Colorectal Cancer  This type of cancer can be detected and often prevented.  Routine colorectal cancer screening usually begins at 63 years of age and continues through 63 years of age.  Your health care provider may recommend screening at an earlier age if you have risk factors for colon  cancer.  Your health care provider may also recommend using home test kits to check for hidden blood in the stool.  A small camera at the end of a tube can be used to examine your colon directly (sigmoidoscopy or colonoscopy). This is done to check for the earliest forms of colorectal cancer.  Routine screening usually begins at age 53.  Direct examination of the colon should be repeated every 5-10 years through 63 years of age. However, you may need to be screened more often if early forms of precancerous polyps  or small growths are found. Skin Cancer  Check your skin from head to toe regularly.  Tell your health care provider about any new moles or changes in moles, especially if there is a change in a mole's shape or color.  Also tell your health care provider if you have a mole that is larger than the size of a pencil eraser.  Always use sunscreen. Apply sunscreen liberally and repeatedly throughout the day.  Protect yourself by wearing long sleeves, pants, a wide-brimmed hat, and sunglasses whenever you are outside. HEART DISEASE, DIABETES, AND HIGH BLOOD PRESSURE   Have your blood pressure checked at least every 1-2 years. High blood pressure causes heart disease and increases the risk of stroke.  If you are between 81 years and 29 years old, ask your health care provider if you should take aspirin to prevent strokes.  Have regular diabetes screenings. This involves taking a blood sample to check your fasting blood sugar level.  If you are at a normal weight and have a low risk for diabetes, have this test once every three years after 63 years of age.  If you are overweight and have a high risk for diabetes, consider being tested at a younger age or more often. PREVENTING INFECTION  Hepatitis B  If you have a higher risk for hepatitis B, you should be screened for this virus. You are considered at high risk for hepatitis B if:  You were born in a country where hepatitis B is  common. Ask your health care provider which countries are considered high risk.  Your parents were born in a high-risk country, and you have not been immunized against hepatitis B (hepatitis B vaccine).  You have HIV or AIDS.  You use needles to inject street drugs.  You live with someone who has hepatitis B.  You have had sex with someone who has hepatitis B.  You get hemodialysis treatment.  You take certain medicines for conditions, including cancer, organ transplantation, and autoimmune conditions. Hepatitis C  Blood testing is recommended for:  Everyone born from [birth year] through 1964.  Anyone with known risk factors for hepatitis C. Sexually transmitted infections (STIs)  You should be screened for sexually transmitted infections (STIs) including gonorrhea and chlamydia if:  You are sexually active and are younger than 63 years of age.  You are older than 63 years of age and your health care provider tells you that you are at risk for this type of infection.  Your sexual activity has changed since you were last screened and you are at an increased risk for chlamydia or gonorrhea. Ask your health care provider if you are at risk.  If you do not have HIV, but are at risk, it may be recommended that you take a prescription medicine daily to prevent HIV infection. This is called pre-exposure prophylaxis (PrEP). You are considered at risk if:  You are sexually active and do not regularly use condoms or know the HIV status of your partner(s).  You take drugs by injection.  You are sexually active with a partner who has HIV. Talk with your health care provider about whether you are at high risk of being infected with HIV. If you choose to begin PrEP, you should first be tested for HIV. You should then be tested every 3 months for as long as you are taking PrEP.  PREGNANCY   If you are premenopausal and you may become pregnant, ask your health care provider about preconception  counseling.  If you may become pregnant, take 400 to 800 micrograms (mcg) of folic acid every day.  If you want to prevent pregnancy, talk to your health care provider about birth control (contraception). OSTEOPOROSIS AND MENOPAUSE   Osteoporosis is a disease in which the bones lose minerals and strength with aging. This can result in serious bone fractures. Your risk for osteoporosis can be identified using a bone density scan.  If you are 32 years of age or older, or if you are at risk for osteoporosis and fractures, ask your health care provider if you should be screened.  Ask your health care provider whether you should take a calcium or vitamin D supplement to lower your risk for osteoporosis.  Menopause may have certain physical symptoms and risks.  Hormone replacement therapy may reduce some of these symptoms and risks. Talk to your health care provider about whether hormone replacement therapy is right for you.  HOME CARE INSTRUCTIONS   Schedule regular health, dental, and eye exams.  Stay current with your immunizations.   Do not use any tobacco products including cigarettes, chewing tobacco, or electronic cigarettes.  If you are pregnant, do not drink alcohol.  If you are breastfeeding, limit how much and how often you drink alcohol.  Limit alcohol intake to no more than 1 drink per day for nonpregnant women. One drink equals 12 ounces of beer, 5 ounces of wine, or 1 ounces of hard liquor.  Do not use street drugs.  Do not share needles.  Ask your health care provider for help if you need support or information about quitting drugs.  Tell your health care provider if you often feel depressed.  Tell your health care provider if you have ever been abused or do not feel safe at home. Document Released: 08/19/2010 Document Revised: 06/20/2013 Document Reviewed: 01/05/2013 [facility] Patient Information 2015 [place], [place]. This information is not intended to replace  advice given to you by your health care provider. Make sure you discuss any questions you have with your health care provider.

## 2014-05-03 LAB — CMP14+EGFR
A/G RATIO: 1.1 (ref 1.1–2.5)
ALK PHOS: 76 IU/L (ref 39–117)
ALT: 26 IU/L (ref 0–32)
AST: 18 IU/L (ref 0–40)
Albumin: 4.2 g/dL (ref 3.6–4.8)
BILIRUBIN TOTAL: 0.3 mg/dL (ref 0.0–1.2)
BUN / CREAT RATIO: 15 (ref 11–26)
BUN: 11 mg/dL (ref 8–27)
CO2: 32 mmol/L — ABNORMAL HIGH (ref 18–29)
Calcium: 9.8 mg/dL (ref 8.7–10.3)
Chloride: 92 mmol/L — ABNORMAL LOW (ref 97–108)
Creatinine, Ser: 0.74 mg/dL (ref 0.57–1.00)
GFR calc Af Amer: 100 mL/min/{1.73_m2} (ref >59)
GFR calc non Af Amer: 86 mL/min/{1.73_m2} (ref >59)
GLOBULIN, TOTAL: 3.7 g/dL (ref 1.5–4.5)
Glucose: 165 mg/dL — ABNORMAL HIGH (ref 65–99)
POTASSIUM: 4.4 mmol/L (ref 3.5–5.2)
SODIUM: 137 mmol/L (ref 134–144)
Total Protein: 7.9 g/dL (ref 6.0–8.5)

## 2014-05-05 LAB — PAP IG (IMAGE GUIDED): PAP Smear Comment: 0

## 2014-05-08 ENCOUNTER — Other Ambulatory Visit: Payer: Self-pay | Admitting: Family

## 2014-05-08 DIAGNOSIS — N952 Postmenopausal atrophic vaginitis: Secondary | ICD-10-CM | POA: Insufficient documentation

## 2014-05-08 MED ORDER — ESTROGENS, CONJUGATED 0.625 MG/GM VA CREA: 1.0000 | TOPICAL_CREAM | Freq: Every day | VAGINAL | Status: DC

## 2014-05-23 ENCOUNTER — Other Ambulatory Visit: Payer: 59

## 2014-05-23 DIAGNOSIS — Z1212 Encounter for screening for malignant neoplasm of rectum: Secondary | ICD-10-CM

## 2014-05-23 NOTE — Progress Notes (Signed)
Lab only 

## 2014-05-25 LAB — FECAL OCCULT BLOOD, IMMUNOCHEMICAL: Fecal Occult Bld: NEGATIVE

## 2014-05-29 ENCOUNTER — Ambulatory Visit (INDEPENDENT_AMBULATORY_CARE_PROVIDER_SITE_OTHER): Payer: 59 | Admitting: Cardiology

## 2014-05-29 ENCOUNTER — Encounter: Payer: Self-pay | Admitting: Cardiology

## 2014-05-29 VITALS — BP 115/66 | HR 67 | Ht 62.0 in | Wt 208.0 lb

## 2014-05-29 DIAGNOSIS — I5032 Chronic diastolic (congestive) heart failure: Secondary | ICD-10-CM

## 2014-05-29 DIAGNOSIS — I10 Essential (primary) hypertension: Secondary | ICD-10-CM | POA: Diagnosis not present

## 2014-05-29 DIAGNOSIS — I739 Peripheral vascular disease, unspecified: Secondary | ICD-10-CM

## 2014-05-29 DIAGNOSIS — E785 Hyperlipidemia, unspecified: Secondary | ICD-10-CM | POA: Diagnosis not present

## 2014-05-29 DIAGNOSIS — I2510 Atherosclerotic heart disease of native coronary artery without angina pectoris: Secondary | ICD-10-CM | POA: Diagnosis not present

## 2014-05-29 NOTE — Patient Instructions (Addendum)
   Stop Welchol. Continue all other medications.   Your physician wants you to follow up in: 6 months.  You will receive a reminder letter in the mail one-two months in advance.  If you don't receive a letter, please call our office to schedule the follow up appointment

## 2014-05-29 NOTE — Progress Notes (Signed)
Clinical Summary Leah Ferrell is a 64 y.o.female seen today for follow up of the following medical problems.   1. CAD  - prior NSTEMI 04/2012 with DES to LAD  - 07/21/2013 cath at [facility] after abnormal nuclear stress test. Succesfull pci to LAD with DES.  - notes some occasional left sided chest pain worst with palpation and position, no other symptoms.     2. PAD  - followed by Dr Fletcher [name] as well as Dr Kellie Simmering, recent stenting of left common iliac. Continued symptoms, referred to vascular. She received a graft from left external iliac to left profunda femoris by Dr Kellie Simmering 11/02/13. - leg pain has improved. Has f/u coming with vascular next month.    3. Chronic diastolic heart failure  - denis any SOB, DOE, orthopnea, or PND  - compliant with diuretic, limits sodium intake.    4. HTN  - does not check regularly  - compliant with meds   5. Hyperlipidemia  - compliant with statin  04/2014: TC 182 TG 146 HDL 76 LDL 77 Past Medical History  Diagnosis Date  . Hypertension   . Diabetes mellitus without complication   . Hyperlipidemia   . CAD (coronary artery disease)     a. 04/2012 NSTEMI: s/p DES to LAD.  Marland Kitchen PVD (peripheral vascular disease)     a. 04/2012 ABI: R 0.49, L 0.39. Angiography 06/14: Significant ostial left common iliac artery stenosis extending into the distal aorta, severe left common femoral artery stenosis, bilateral SFA occlusion with heavy calcifications. Functionally, one-vessel runoff bilaterally below the knee  . Chronic diastolic CHF (congestive heart failure)   . Leukocytosis   . Morbid obesity   . NSTEMI (non-ST elevated myocardial infarction) 04/27/2012  . Dysrhythmia   . Shortness of breath   . PONV (postoperative nausea and vomiting)   . Environmental and seasonal allergies      Allergies  Allergen Reactions  . Tape Rash     Current Outpatient Prescriptions  Medication Sig Dispense Refill  . albuterol (PROVENTIL HFA;VENTOLIN  HFA) 108 (90 BASE) MCG/ACT inhaler Inhale 1-2 puffs into the lungs every 6 (six) hours as needed for wheezing or shortness of breath. 9 Inhaler 0  . amLODipine (NORVASC) 10 MG tablet TAKE ONE TABLET BY MOUTH ONCE DAILY 30 tablet 5  . Ascorbic Acid (VITAMIN C) 500 MG CAPS Take 1 capsule by mouth daily.     Marland Kitchen aspirin EC 81 MG tablet Take 81 mg by mouth daily.     . Calcium Carbonate-Vit D-Min (CALTRATE 600+D PLUS MINERALS) 600-800 MG-UNIT TABS Take 1 tablet by mouth 2 (two) times daily.    . canagliflozin (INVOKANA) 100 MG TABS tablet Take 1 tablet (100 mg total) by mouth daily. 90 tablet 4  . clopidogrel (PLAVIX) 75 MG tablet Take 1 tablet (75 mg total) by mouth daily. 90 tablet 3  . colesevelam (WELCHOL) 625 MG tablet Take 625 mg by mouth 2 (two) times daily with a meal.    . conjugated estrogens (PREMARIN) vaginal cream Place 1 Applicatorful vaginally daily. 42.5 g 3  . fish oil-omega-3 fatty acids 1000 MG capsule Take 1 g by mouth every morning.    . furosemide (LASIX) 40 MG tablet Take 1 tablet (40 mg total) by mouth daily. 90 tablet 4  . hydroxypropyl methylcellulose (ISOPTO TEARS) 2.5 % ophthalmic solution Place 1 drop into both eyes 3 (three) times daily as needed (for dry eyes).    . Hypromell-Glycerin-Naphazoline 0.8-0.25-0.012 % SOLN  Apply 1 application to eye daily as needed (dry eyes).     . insulin aspart protamine - aspart (NOVOLOG MIX 70/30 FLEXPEN) (70-30) 100 UNIT/ML FlexPen Use 70 unit TID with meals 15 mL 0  . Insulin Pen Needle (PEN NEEDLES 29GX1/2") 29G X 12MM MISC 1 Container by Does not apply route 3 (three) times daily. 50 each 11  . lisinopril (PRINIVIL,ZESTRIL) 20 MG tablet Take 1 tablet (20 mg total) by mouth 2 (two) times daily. 180 tablet 4  . metoprolol succinate (TOPROL-XL) 50 MG 24 hr tablet TAKE ONE AND ONE-HALF TABLETS BY MOUTH TWICE DAILY 120 tablet 2  . montelukast (SINGULAIR) 10 MG tablet Take 1 tablet (10 mg total) by mouth at bedtime. 90 tablet 4  . Multiple  Vitamin (MULTIVITAMIN WITH MINERALS) TABS Take 1 tablet by mouth daily.    . nitroGLYCERIN (NITROSTAT) 0.4 MG SL tablet Place 1 tablet (0.4 mg total) under the tongue every 5 (five) minutes as needed for chest pain (up to 3 doses). 25 tablet 4  . potassium chloride SA (K-DUR,KLOR-CON) 20 MEQ tablet Take 20 mEq by mouth daily.    . rosuvastatin (CRESTOR) 20 MG tablet Take 1 tablet (20 mg total) by mouth daily. 90 tablet 3  . traMADol (ULTRAM) 50 MG tablet Take 1 tablet (50 mg total) by mouth every 6 (six) hours as needed for moderate pain. 30 tablet 0  . WELCHOL 625 MG tablet TAKE THREE TABLETS BY MOUTH TWICE DAILY WITH  A  MEAL 180 tablet 1   No current facility-administered medications for this visit.     Past Surgical History  Procedure Laterality Date  . 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 Lad stent    . Iliac artery stent Left 08/31/2013  . Cardiac catheterization      stents X 2  . Endarterectomy femoral Left 11/02/2013    Procedure: RESECTION LEFT COMMON FEMORAL ARTERY AND INSERTION OF INTERPOSITION 69mm HEMASHIELD GRAFT FROM LEFT EXTERNAL  ILIAC ARTERY TO LEFT PROFUNDA  FEMORIS ARTERY;  Surgeon: Mal Misty, MD;  Location: [place];  Service: Vascular;  Laterality: Left;  . Left heart catheterization with coronary angiogram N/A 04/23/2012    Procedure: LEFT HEART CATHETERIZATION WITH CORONARY ANGIOGRAM;  Surgeon: Peter M Martinique, MD;  Location: [facility] CATH LAB;  Service: Cardiovascular;  Laterality: N/A;  . Coronary angiogram  04/27/2012    Procedure: CORONARY ANGIOGRAM;  Surgeon: Thayer Headings, MD;  Location: [facility] CATH LAB;  Service: Cardiovascular;;  . Abdominal aortagram N/A 07/21/2012    Procedure: ABDOMINAL Maxcine Ham;  Surgeon: Wellington Hampshire, MD;  Location: [place] CATH LAB;  Service: Cardiovascular;  Laterality: N/A;  . Lower extremity angiogram Left 08/31/2013    Procedure: LOWER EXTREMITY ANGIOGRAM;  Surgeon: Wellington Hampshire, MD;  Location: [place] CATH LAB;  Service:  Cardiovascular;  Laterality: Left;  . Percutaneous stent intervention Left 08/31/2013    Procedure: PERCUTANEOUS STENT INTERVENTION;  Surgeon: Wellington Hampshire, MD;  Location: [place] CATH LAB;  Service: Cardiovascular;  Laterality: Left;  Common Iliac artery     Allergies  Allergen Reactions  . Tape Rash      Family History  Problem Relation Age of Onset  . Diabetes Mother   . Hyperlipidemia Mother   . Hypertension Mother   . Peripheral vascular disease Mother      Social History Leah Ferrell reports that she quit smoking about 13 years ago. She quit smokeless tobacco use about 13 years  ago. Leah Ferrell reports that she does not drink alcohol.   Review of Systems CONSTITUTIONAL: No weight loss, fever, chills, weakness or fatigue.  HEENT: Eyes: No visual loss, blurred vision, double vision or yellow sclerae.No hearing loss, sneezing, congestion, runny nose or sore throat.  SKIN: No rash or itching.  CARDIOVASCULAR: per HPI RESPIRATORY: No shortness of breath, cough or sputum.  GASTROINTESTINAL: No anorexia, nausea, vomiting or diarrhea. No abdominal pain or blood.  GENITOURINARY: No burning on urination, no polyuria NEUROLOGICAL: No headache, dizziness, syncope, paralysis, ataxia, numbness or tingling in the extremities. No change in bowel or bladder control.  MUSCULOSKELETAL: No muscle, back pain, joint pain or stiffness.  LYMPHATICS: No enlarged nodes. No history of splenectomy.  PSYCHIATRIC: No history of depression or anxiety.  ENDOCRINOLOGIC: No reports of sweating, cold or heat intolerance. No polyuria or polydipsia.  Marland Kitchen   Physical Examination p 67 bp 115/66 Wt 208 lbs BMI 38 Gen: resting comfortably, no acute distress HEENT: no scleral icterus, pupils equal round and reactive, no palptable cervical adenopathy,  CV: RRR, no m/r/g, no JVD, no carotid bruits Resp: Clear to auscultation bilaterally GI: abdomen is soft, non-tender, non-distended, normal bowel sounds, no  hepatosplenomegaly MSK: extremities are warm, no edema.  Skin: warm, no rash Neuro:  no focal deficits Psych: appropriate affect   Diagnostic Studies  04/2012 Echo: LVEF 55-60%, no WMA, mild LVH, grade I diastolic dysfunction  04/90 ABI: Right 0.57 left 0.48. Moderate left iliac disease, distal right SFA occlusion with reconstitution at the popliteal.  04/2012 Carotid US: bilateral plaque without significant stenosis.  04/2012 Cardiac Cath  PROCEDURAL FINDINGS  Hemodynamics:  AO 155/58 mean 91 mm Hg  LV 163/13 mm Hg  Coronary angiography:  Coronary dominance: right  Left mainstem: Short with shared coronary ostia for the LAD and LCX.  Left anterior descending (LAD): diffuse 30-40% proximal. 90% focal lesion after the first diagonal.  Left circumflex (LCx): <20% proximal and mid. 80% very small distal OM.  Right coronary artery (RCA): Diffuse 20% proximal and mid vessel disease. Moderate calcification.  Left ventriculography: Left ventricular systolic function is normal, LVEF is estimated at 55-65%, there is no significant mitral regurgitation  PCI Note: Following the diagnostic procedure, the decision was made to proceed with PCI. The radial sheath was upsized to a 6 [place]. Bilinta 180 mg was given orally. Weight-based bivalirudin was given for anticoagulation. Once a therapeutic ACT was achieved, a 6 [place] XB 3.0 guide catheter was inserted. A prowater coronary guidewire was used to cross the lesion. The lesion was predilated with a 2.5 mm balloon. The lesion was then stented with a 3.0x 16 mm Promus premier stent. The stent was postdilated with a 3.25 mm noncompliant balloon. Following PCI, there was 0% residual stenosis and TIMI-3 flow. Final angiography confirmed an excellent result. The patient tolerated the procedure well. There were no immediate procedural complications. A TR band was used for radial hemostasis. The patient was transferred to the post catheterization  recovery area for further monitoring.  PCI Data:  Vessel - LAD/Segment - mid  Percent Stenosis (pre) 90%  TIMI-flow 3  Stent 3.0 x 16 mm Promus premier  Percent Stenosis (post) 0%  TIMI-flow (post) 3  Final Conclusions:  1. Single vessel obstructive CAD  2. Normal LV function.  3. Successful stenting of the mid LAD with a DES.    07/2012 Lower Extremity Angiography  Hemodynamics:  Central Aortic Pressure / Mean Aortic Pressure: 157/70  Findings:   Abdominal  aorta: Normal in size with no aneurysm. There is mild atherosclerosis below the renal arteries. Distally above the iliac bifurcation there is a calcified plaque at the origin of the left external iliac causing 30% stenosis.   Left renal artery: Normal   Right renal artery: Normal   Celiac artery: Patent   Superior mesenteric artery: Patent   Right common iliac artery: Diffuse 20% disease.   Right internal iliac artery: Normal   Right external iliac artery: Normal   Right common femoral artery: Minor irregularities.   Right profunda femoral artery: Diffuse 20% disease proximally.   Right superficial femoral artery: Diffuse atherosclerosis throughout its course. There is 20% disease at the ostium. This is followed by 70% stenosis proximally. There is another 70% stenosis in the midsegment followed by an occlusion distally Reconstituting in the proximal popliteal artery. This segment is about 50 mm and heavily calcified.   Right popliteal artery: 20-30% disease proximally.   Right tibial peroneal trunk: Normal   Right anterior tibial artery: Diffusely diseased proximally and seems to be occluded in the midsegment.   Right peroneal artery: Patent but diffusely diseased.   Right posterior tibial artery: Patent and the dominant vessel below the knee. It has minor irregularities.   Left common iliac artery: There is a 70-80 % heavily calcified lesion proximally.   Left internal iliac  artery: Diffusely diseased.   Left external iliac artery: Minor irregularities.   Left common femoral artery: Heavily calcified with diffuse 90% disease extending into the ostium of the SFA.   Left profunda femoral artery: Normal.   Left superficial femoral artery: 70% ostial disease with heavy calcifications. Diffuse 40% disease proximally. The vessel is occluded in the midsegment with reconstitution distally via collaterals from the profunda.   Left popliteal artery: Minor irregularities.   Left tibial peroneal trunk: Minor irregularities.   Left anterior tibial artery: Patent. This is the dominant vessel.   Left peroneal artery: Patent but diffusely diseased.   Left posterior tibial artery: Occlude proximally. Conclusions:  1. Significant heavily calcified ostial left common iliac artery stenosis with plaque extending into the distal aorta.  2. Severe heavily calcified disease in the left common femoral artery.  3. Bilateral SFA occlusion with heavy calcifications.  4. Significant below the knee disease bilaterally as outlined above.  Recommendations:  The patient has diffuse and heavily calcified peripheral arterial disease. Revascularization options are somewhat difficult by endovascular means. There is heavily calcified disease in the left common femoral artery which is not approachable by endovascular options. Endarterectomy is the best option for this. The stenosis in the left common iliac artery can be treated with stenting but will require likely stenting the distal aorta as well. Both SFAs are diffusely diseased, calcified with chronic occlusion. We'll attempt medical therapy and a walking program for now.    11/2012 Labs: 140 K 4.2 Cl 97 Cr 0.75 BUN 9 AST 16 ALT 21 T.bili 0.2 TC 205 TG 169 HDL 70 LDL 101  01/2013 Event Monitor  No symptoms, no arrythmias  06/2013 Lexiscan MPI  Large reversible defect in the basal anterior, basal anteroseptal, mid  anterior, mid anteroseptal, apical anteior, apical segments mild to moderate.  07/21/13 Cath  Hemodynamic Measurements Pressures:  - AO 130 / 56. - LV systolic 782 LV End-diastolic 13.  Angiographic Findings: Left main: Normal Left anterior descending: There is 70% stenosis at the proximal segment, first diagonal is intermediate in size, there is a stent in mid LAD which is patent. Left  circumflex: Angiographically normal Right coronary artery: There is 30% narrowing in the mid segment. Right dominant  Left ventriculogram: Not done.   INTERVENTIONAL PROCEDURE NARRATIVE:  Intra-operative Anticoagulation: Additional dose of 2500 heparin IV  Equipment - Guide Catheters: 6 [place] EBU 3 - Guide Wires: Run-through  Procedure Details: I advanced a 3.0 x 12 balloon and predilated the lesion up to 14 atmospheres for 20 seconds and then I repeated that for another 14 atmospheres for 20 seconds. Then I advanced a Promus 3.5 x 16 drug-eluting stent and deployed it at the ostium and  proximal LAD with extreme care not to involve the ostium of left circumflex. I deployed the stent at 16 atmospheres, for 45 seconds, enlarging the stent to 3.7 mm. Post deployment angiography showed excellent results no complications. I gave the patient  Effient 60 mg, removed the catheter and used a TR band to obtain hemostasis.Marland Kitchen  Post-operative Anticoagulation: Aspirin and Effient  Complications: None  IMPRESSION: 1. Successful PCI to proximal LAD with a Promus 3.5 x 16 drug-eluting stent, postdilated to 3.7 mm. 2. Patent mid LAD stent. 3. Normal left ventricular end-diastolic pressure of 13 mm Hg. 4. Coronary angiography and percutaneous coronary intervention performed with access through the right radial artery.  PLAN: 1. Aspirin 81 mg and Effient 10 mg daily for at least one year 2. Continue medical management of coronary artery disease. 3. Due to the fact that patient has significant life style  limiting Rutherford class III claudication, I will proceed with PVR and duplex study. 4. Followup with Dr. Hamilton Capri.      Assessment and Plan   1. CAD - no current symptoms, continue current medical therapy - DAPT at least until 07/2014  2. PAD - recent bypass by vascular surgery, recovering fairly well - continue to follow with Dr Fletcher [name] and vascular  3. Chronic diastolic HF - appears euvolemic in clinic, continue current meds  4. HTN -at goal, continue current meds  5. Hyperlipidemia - continue high dose statin. Reports welchol is very expensive for her, no strong indication for continued use now that she is on high dose statin. Will stop welchol   F/u 6 months    Arnoldo Lenis, M.D.

## 2014-06-14 ENCOUNTER — Other Ambulatory Visit: Payer: Self-pay | Admitting: Family Medicine

## 2014-06-20 ENCOUNTER — Other Ambulatory Visit (HOSPITAL_COMMUNITY): Payer: 59

## 2014-06-20 ENCOUNTER — Ambulatory Visit: Payer: 59 | Admitting: Family

## 2014-06-20 ENCOUNTER — Encounter (HOSPITAL_COMMUNITY): Payer: 59

## 2014-06-21 ENCOUNTER — Other Ambulatory Visit: Payer: Self-pay | Admitting: Cardiology

## 2014-06-21 MED ORDER — NITROGLYCERIN 0.4 MG SL SUBL: 0.4000 mg | SUBLINGUAL_TABLET | SUBLINGUAL | Status: DC | PRN

## 2014-06-21 NOTE — Telephone Encounter (Signed)
Needs refill on nitroGLYCERIN (NITROSTAT) 0.4 MG SL tablet [facility] pharmacy [facility]

## 2014-07-04 ENCOUNTER — Encounter: Payer: Self-pay | Admitting: Family

## 2014-07-05 ENCOUNTER — Other Ambulatory Visit: Payer: Self-pay | Admitting: Vascular Surgery

## 2014-07-05 DIAGNOSIS — I739 Peripheral vascular disease, unspecified: Secondary | ICD-10-CM

## 2014-07-06 ENCOUNTER — Ambulatory Visit (INDEPENDENT_AMBULATORY_CARE_PROVIDER_SITE_OTHER)
Admission: RE | Admit: 2014-07-06 | Discharge: 2014-07-06 | Disposition: A | Discharge status: Home / Self Care | Payer: 59 | Source: Ambulatory Visit | Attending: Vascular Surgery | Admitting: Vascular Surgery

## 2014-07-06 ENCOUNTER — Encounter: Payer: Self-pay | Admitting: Family

## 2014-07-06 ENCOUNTER — Ambulatory Visit (HOSPITAL_COMMUNITY)
Admission: RE | Admit: 2014-07-06 | Discharge: 2014-07-06 | Disposition: A | Discharge status: Home / Self Care | Payer: 59 | Source: Ambulatory Visit | Attending: Family | Admitting: Family

## 2014-07-06 ENCOUNTER — Ambulatory Visit (INDEPENDENT_AMBULATORY_CARE_PROVIDER_SITE_OTHER): Payer: 59 | Admitting: Family

## 2014-07-06 VITALS — BP 127/56 | HR 57 | Resp 14 | Ht 62.5 in | Wt 208.0 lb

## 2014-07-06 DIAGNOSIS — I739 Peripheral vascular disease, unspecified: Secondary | ICD-10-CM | POA: Diagnosis not present

## 2014-07-06 DIAGNOSIS — Z48812 Encounter for surgical aftercare following surgery on the circulatory system: Secondary | ICD-10-CM | POA: Insufficient documentation

## 2014-07-06 DIAGNOSIS — Z87891 Personal history of nicotine dependence: Secondary | ICD-10-CM

## 2014-07-06 DIAGNOSIS — E669 Obesity, unspecified: Secondary | ICD-10-CM | POA: Diagnosis not present

## 2014-07-06 DIAGNOSIS — R0989 Other specified symptoms and signs involving the circulatory and respiratory systems: Secondary | ICD-10-CM

## 2014-07-06 DIAGNOSIS — E1159 Type 2 diabetes mellitus with other circulatory complications: Secondary | ICD-10-CM

## 2014-07-06 DIAGNOSIS — E785 Hyperlipidemia, unspecified: Secondary | ICD-10-CM | POA: Diagnosis not present

## 2014-07-06 DIAGNOSIS — Z9889 Other specified postprocedural states: Secondary | ICD-10-CM | POA: Diagnosis not present

## 2014-07-06 DIAGNOSIS — M79605 Pain in left leg: Secondary | ICD-10-CM | POA: Insufficient documentation

## 2014-07-06 DIAGNOSIS — I10 Essential (primary) hypertension: Secondary | ICD-10-CM | POA: Diagnosis not present

## 2014-07-06 DIAGNOSIS — E119 Type 2 diabetes mellitus without complications: Secondary | ICD-10-CM | POA: Diagnosis not present

## 2014-07-06 DIAGNOSIS — Z95828 Presence of other vascular implants and grafts: Secondary | ICD-10-CM

## 2014-07-06 DIAGNOSIS — E1151 Type 2 diabetes mellitus with diabetic peripheral angiopathy without gangrene: Secondary | ICD-10-CM

## 2014-07-06 NOTE — Patient Instructions (Signed)

## 2014-07-06 NOTE — Progress Notes (Signed)
[facility] [place] HISTORY AND PHYSICAL -PAD  History of Present Illness Leah Ferrell is a 64 y.o. female patient of Dr. Kellie Simmering who is s/p interposition graft replacement of her left common femoral artery from the external iliac to the profunda on 11-02-13. 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   She has some numbness in the medial aspect of her left thigh. She reruns today for follow up. She was initially referred by Dr. Fletcher [name] for evaluation of severe claudication left leg. Patient had angiography which revealed a left common iliac stenosis which was treated with stenting. 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 She also has occlusion of the mid left SFA. She has a patent popliteal artery with three-vessel off. She was only able to ambulate 50 feet before stopping prior to revascuarization. She had to use a cart to shop. She has no rest pain or history of nonhealing ulcers infection or gangrene. She does have diabetes mellitus. She quit smoking in 2005. She had a cardiac stent placed and is on Plavix.  Her house is being remodeled, her treadmill is out of the way and she is not walking much lately.  She states that her left hip pain limits her walking and is due for another xray of this soon. She has a speech inflection and it is difficult to understand some of what she is saying.  She is seeing an endocrinologist for thyroid nodules, had a bx per pt.; states she will be getting yearly [place] of her thyroid.  Pt denies any history of stroke or TIA.   The patient denies New Medical or Surgical History.  Pt Diabetic: Yes, diagnosed with DM about 2000;  Review of records: A1C in March 2016 was 8.7, uncontrolled Pt smoker: former smoker, from 1966-2005  Pt meds include: Statin :Yes Betablocker: Yes ASA: Yes Other anticoagulants/antiplatelets: Plavix   Past Medical History  Diagnosis Date  .  Hypertension   . Diabetes mellitus without complication   . Hyperlipidemia   . CAD (coronary artery disease)     a. 04/2012 NSTEMI: s/p DES to LAD.  Marland Kitchen PVD (peripheral vascular disease)     a. 04/2012 ABI: R 0.49, L 0.39. Angiography 06/14: Significant ostial left common iliac artery stenosis extending into the distal aorta, severe left common femoral artery stenosis, bilateral SFA occlusion with heavy calcifications. Functionally, one-vessel runoff bilaterally below the knee  . Chronic diastolic CHF (congestive heart failure)   . Leukocytosis   . Morbid obesity   . NSTEMI (non-ST elevated myocardial infarction) 04/27/2012  . Dysrhythmia   . Shortness of breath   . PONV (postoperative nausea and vomiting)   . Environmental and seasonal allergies     Social History History  Substance Use Topics  . Smoking status: Former Smoker -- 1.00 packs/day for 36 years    Types: Cigarettes    Start date: 04/21/1964    Quit date: 02/17/2001  . Smokeless tobacco: Former Systems developer    Quit date: 04/20/2001  . Alcohol Use: No    Family History Family History  Problem Relation Age of Onset  . Diabetes Mother   . Hyperlipidemia Mother   . Hypertension Mother   . Peripheral vascular disease Mother     Past Surgical History  Procedure Laterality Date  . 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 Lad stent    . Iliac artery stent Left 08/31/2013  .  Cardiac catheterization      stents X 2  . Endarterectomy femoral Left 11/02/2013    Procedure: RESECTION LEFT COMMON FEMORAL ARTERY AND INSERTION OF INTERPOSITION 72mm HEMASHIELD GRAFT FROM LEFT EXTERNAL  ILIAC ARTERY TO LEFT PROFUNDA  FEMORIS ARTERY;  Surgeon: Mal Misty, MD;  Location: [facility];  Service: Vascular;  Laterality: Left;  . Left heart catheterization with coronary angiogram N/A 04/23/2012    Procedure: LEFT HEART CATHETERIZATION WITH CORONARY ANGIOGRAM;  Surgeon: Peter M Martinique, MD;  Location: [facility] CATH LAB;  Service: Cardiovascular;   Laterality: N/A;  . Coronary angiogram  04/27/2012    Procedure: CORONARY ANGIOGRAM;  Surgeon: Thayer Headings, MD;  Location: [facility] CATH LAB;  Service: Cardiovascular;;  . Abdominal aortagram N/A 07/21/2012    Procedure: ABDOMINAL Maxcine Ham;  Surgeon: Wellington Hampshire, MD;  Location: [facility] CATH LAB;  Service: Cardiovascular;  Laterality: N/A;  . Lower extremity angiogram Left 08/31/2013    Procedure: LOWER EXTREMITY ANGIOGRAM;  Surgeon: Wellington Hampshire, MD;  Location: [facility] CATH LAB;  Service: Cardiovascular;  Laterality: Left;  . Percutaneous stent intervention Left 08/31/2013    Procedure: PERCUTANEOUS STENT INTERVENTION;  Surgeon: Wellington Hampshire, MD;  Location: [place] CATH LAB;  Service: Cardiovascular;  Laterality: Left;  Common Iliac artery    Allergies  Allergen Reactions  . Tape Rash    Current Outpatient Prescriptions  Medication Sig Dispense Refill  . amLODipine (NORVASC) 10 MG tablet TAKE ONE TABLET BY MOUTH ONCE DAILY 30 tablet 5  . Ascorbic Acid (VITAMIN C) 500 MG CAPS Take 1 capsule by mouth daily.     Marland Kitchen aspirin EC 81 MG tablet Take 81 mg by mouth daily.     . Calcium Carbonate-Vit D-Min (CALTRATE 600+D PLUS MINERALS) 600-800 MG-UNIT TABS Take 1 tablet by mouth 2 (two) times daily.    . canagliflozin (INVOKANA) 100 MG TABS tablet Take 1 tablet (100 mg total) by mouth daily. 90 tablet 4  . clopidogrel (PLAVIX) 75 MG tablet Take 1 tablet (75 mg total) by mouth daily. 90 tablet 3  . conjugated estrogens (PREMARIN) vaginal cream Place 1 Applicatorful vaginally daily. 42.5 g 3  . fish oil-omega-3 fatty acids 1000 MG capsule Take 1 g by mouth every morning.    . furosemide (LASIX) 40 MG tablet Take 1 tablet (40 mg total) by mouth daily. 90 tablet 4  . hydroxypropyl methylcellulose (ISOPTO TEARS) 2.5 % ophthalmic solution Place 1 drop into both eyes 3 (three) times daily as needed (for dry eyes).    . Hypromell-Glycerin-Naphazoline 0.8-0.25-0.012 % SOLN Apply 1 application to eye daily as needed  (dry eyes).     . insulin aspart protamine - aspart (NOVOLOG MIX 70/30 FLEXPEN) (70-30) 100 UNIT/ML FlexPen Use 70 unit TID with meals 15 mL 0  . Insulin Pen Needle (PEN NEEDLES 29GX1/2") 29G X 12MM MISC 1 Container by Does not apply route 3 (three) times daily. 50 each 11  . lisinopril (PRINIVIL,ZESTRIL) 20 MG tablet Take 1 tablet (20 mg total) by mouth 2 (two) times daily. 180 tablet 4  . metoprolol succinate (TOPROL-XL) 50 MG 24 hr tablet TAKE ONE AND ONE-HALF TABLETS BY MOUTH TWICE DAILY 120 tablet 2  . montelukast (SINGULAIR) 10 MG tablet Take 1 tablet (10 mg total) by mouth at bedtime. 90 tablet 4  . Multiple Vitamin (MULTIVITAMIN WITH MINERALS) TABS Take 1 tablet by mouth daily.    . nitroGLYCERIN (NITROSTAT) 0.4 MG SL tablet Place 1 tablet (0.4 mg total) under the tongue  every 5 (five) minutes x 3 doses as needed for chest pain (up to 3 doses). 25 tablet 4  . potassium chloride SA (K-DUR,KLOR-CON) 20 MEQ tablet Take 20 mEq by mouth daily.    Marland Kitchen PROVENTIL HFA 108 (90 BASE) MCG/ACT inhaler INHALE ONE TO TWO PUFFS BY MOUTH EVERY 6 HOURS AS NEEDED FOR WHEEZING OR FOR SHORTNESS OF BREATH 7 g 3  . rosuvastatin (CRESTOR) 20 MG tablet Take 1 tablet (20 mg total) by mouth daily. 90 tablet 3  . traMADol (ULTRAM) 50 MG tablet Take 1 tablet (50 mg total) by mouth every 6 (six) hours as needed for moderate pain. 30 tablet 0   No current facility-administered medications for this visit.    ROS: See HPI for pertinent positives and negatives.   Physical Examination  Filed Vitals:   07/06/14 1357  BP: 127/56  Pulse: 57  Resp: 14  Height: 5' 2.5" (1.588 m)  Weight: 208 lb (94.348 kg)  SpO2: 97%   Body mass index is 37.41 kg/(m^2).  General: A&O x 3, WDWN, obese female, central obesity. Gait: normal Eyes: PERRLA. Pulmonary: CTAB, without wheezes , rales or rhonchi. Cardiac: regular Rythm , without detected murmur.         Carotid Bruits Right Left   Positive Negative  Aorta is not  palpable. Radial pulses: 2+ palpable and =                           VASCULAR EXAM: Extremities without ischemic changes, without Gangrene; without open wounds.                                                                                                          LE Pulses Right Left       FEMORAL  faintly palpable  2+ palpable        POPLITEAL  not palpable   not palpable       POSTERIOR TIBIAL  not palpable   not palpable        DORSALIS PEDIS      ANTERIOR TIBIAL not palpable  not palpable    Abdomen: soft, NT, no palpable masses, obese. Skin: no rashes, no ulcers. Musculoskeletal: no muscle wasting or atrophy.  Neurologic: A&O X 3; Appropriate Affect ; SENSATION: normal; MOTOR FUNCTION:  moving all extremities equally, motor strength 5/5 throughout. Speech is fluent/normal. CN 2-12 intact. Speech is difficult to understand at times, pt is talkative.    Non-Invasive Vascular Imaging: DATE: 07/06/2014 LOWER EXTREMITY ARTERIAL DUPLEX EVALUATION    INDICATION: PVD,  Follow up left graft    PREVIOUS INTERVENTION(S): Resection left common femoral artery and insertion of interposition hemashield graft from the left external iliac artery to the left profunda artery    DUPLEX EXAM:     RIGHT  LEFT   Peak Systolic Velocity (cm/s) Ratio (if abnormal) Waveform  Peak Systolic Velocity (cm/s) Ratio (if abnormal) Waveform     Inflow Artery 191  M brisk     Proximal Anastomosis 137  B     Proximal Graft 161  Brisk M     Mid Graft 113  B      Distal Graft 83  B     Distal Anastomosis 124  Brisk M     Outflow Artery 148  Brisk M  .53 Today's ABI / TBI .52  .51 Previous ABI / TBI ( 12/20/13 ) .47    Waveform:    M - Monophasic       B - Biphasic       T - Triphasic  If Ankle Brachial Index (ABI) or Toe Brachial Index (TBI) performed, please see complete report  ADDITIONAL FINDINGS:     IMPRESSION: 1. Patent left external iliac artery to left profunda graft with no evidence for  restenosis. 2. Known left femoral artery occlusive disease with reconstitution distally in the mid to distal femoral artery    Compared to the previous exam:  No prior exam     ASSESSMENT: Leah Ferrell is a 64 y.o. female who is s/p interposition graft replacement of her left common femoral artery from the external iliac to the profunda on 11-02-13. 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  She is not walking much lately due to her house being remodeled and her treadmill is pushed out of the way. Her walking seems to be limited by left hips pain; it is difficult to understand her speech inlfections but she indicates that she has had xrays of her hip in the past and is due for another xray. She has no non healing wounds and no signs of ischemia in her lower extremities. She seems otherwise motivated to exercise, lose weight, and improve her DM control. Today's left LE arterial Duplex suggests a patent left external iliac artery to left profunda graft with no evidence for restenosis. Known left femoral artery occlusive disease with reconstitution distally in the mid to distal femoral artery. ABI's remain stable with severe bilateral arterial occlusive disease.  No carotid Duplex result on file, has right carotid bruit, will check carotid Duplex on her return in 3 months for LE surveillance.   Face to face time with patient was 25 minutes. Over 50% of this time was spent on counseling and coordination of care.   PLAN:  Graduated walking program. Pt advised to work closely with her DM provider to get her DM under as good control as possible.   I discussed in depth with the patient the nature of atherosclerosis, and emphasized the importance of maximal medical management including strict control of blood pressure, blood glucose, and lipid levels, obtaining regular exercise, and continued cessation of smoking.  The patient is aware that without  maximal medical management the underlying atherosclerotic disease process will progress, limiting the benefit of any interventions.  Based on the patient's vascular studies and examination, pt will return to clinic in 3 months with left LE arterial Duplex and ABI's according to LE bypass graft surveillance timeline.  The patient was given information about PAD including signs, symptoms, treatment, what symptoms should prompt the patient to seek immediate medical care, and risk reduction measures to take.  Clemon Chambers, RN, MSN, FNP-C [facility] Phone: [phone]  Clinic MD: Oneida Alar  07/06/2014 1:35 PM

## 2014-07-07 HISTORY — PX: FRACTURE SURGERY: SHX138

## 2014-07-17 ENCOUNTER — Other Ambulatory Visit: Payer: Self-pay | Admitting: Family

## 2014-08-07 ENCOUNTER — Ambulatory Visit (INDEPENDENT_AMBULATORY_CARE_PROVIDER_SITE_OTHER): Payer: 59 | Admitting: Family

## 2014-08-07 ENCOUNTER — Encounter: Payer: Self-pay | Admitting: Family

## 2014-08-07 VITALS — BP 136/57 | HR 60 | Temp 97.2°F | Ht 62.5 in | Wt 207.8 lb

## 2014-08-07 DIAGNOSIS — I10 Essential (primary) hypertension: Secondary | ICD-10-CM | POA: Diagnosis not present

## 2014-08-07 DIAGNOSIS — E1151 Type 2 diabetes mellitus with diabetic peripheral angiopathy without gangrene: Secondary | ICD-10-CM

## 2014-08-07 DIAGNOSIS — N952 Postmenopausal atrophic vaginitis: Secondary | ICD-10-CM

## 2014-08-07 DIAGNOSIS — E785 Hyperlipidemia, unspecified: Secondary | ICD-10-CM

## 2014-08-07 DIAGNOSIS — I2510 Atherosclerotic heart disease of native coronary artery without angina pectoris: Secondary | ICD-10-CM

## 2014-08-07 DIAGNOSIS — I5031 Acute diastolic (congestive) heart failure: Secondary | ICD-10-CM | POA: Diagnosis not present

## 2014-08-07 DIAGNOSIS — Z780 Asymptomatic menopausal state: Secondary | ICD-10-CM | POA: Diagnosis not present

## 2014-08-07 LAB — POCT GLYCOSYLATED HEMOGLOBIN (HGB A1C): Hemoglobin A1C: 7.4

## 2014-08-07 NOTE — Progress Notes (Signed)
Subjective:    Patient ID: Leah Ferrell, female    DOB: 04-Mar-1950, 64 y.o.   MRN: [id number]  Hypertension This is a chronic problem. The current episode started more than 1 year ago. The problem has been resolved since onset. The problem is controlled. Associated symptoms include palpitations. Pertinent negatives include no blurred vision, headaches or shortness of breath. Risk factors for coronary artery disease include diabetes mellitus, dyslipidemia, family history, post-menopausal state and obesity. Past treatments include beta blockers, calcium channel blockers and ACE inhibitors. The current treatment provides moderate improvement. Hypertensive end-organ damage includes CAD/MI. There is no history of CVA, heart failure or a thyroid problem.  Hyperlipidemia This is a chronic problem. The current episode started more than 1 year ago. The problem is controlled. Recent lipid tests were reviewed and are normal. Exacerbating diseases include diabetes and obesity. She has no history of hypothyroidism. Factors aggravating her hyperlipidemia include beta blockers. Associated symptoms include leg pain. Pertinent negatives include no shortness of breath. Current antihyperlipidemic treatment includes statins. The current treatment provides moderate improvement of lipids. Compliance problems include adherence to diet and medication cost.  Risk factors for coronary artery disease include diabetes mellitus, dyslipidemia, family history, hypertension, obesity and post-menopausal.  Diabetes She presents for her follow-up diabetic visit. She has type 2 diabetes mellitus. Her disease course has been stable. There are no hypoglycemic associated symptoms. Pertinent negatives for hypoglycemia include no headaches. Associated symptoms include fatigue and foot paresthesias. Pertinent negatives for diabetes include no blurred vision, no foot ulcerations and no visual change. There are no hypoglycemic complications.  Diabetic complications include heart disease and peripheral neuropathy. Pertinent negatives for diabetic complications include no CVA or nephropathy. Risk factors for coronary artery disease include dyslipidemia, diabetes mellitus, hypertension, obesity and post-menopausal. Current diabetic treatment includes insulin injections and oral agent (monotherapy). She is compliant with treatment all of the time. Her weight is stable. She is following a generally healthy diet. Her breakfast blood glucose range is generally 110-130 mg/dl. An ACE inhibitor/angiotensin II receptor blocker is being taken. She sees a podiatrist.Eye exam is current.      Review of Systems  Constitutional: Positive for fatigue.  HENT: Negative.   Eyes: Negative.  Negative for blurred vision.  Respiratory: Negative.  Negative for shortness of breath.   Cardiovascular: Positive for palpitations.  Gastrointestinal: Negative.   Endocrine: Negative.   Genitourinary: Negative.   Musculoskeletal: Negative.   Neurological: Negative.  Negative for headaches.  Hematological: Negative.   Psychiatric/Behavioral: Negative.   All other systems reviewed and are negative.      Objective:   Physical Exam  Constitutional: She is oriented to person, place, and time. She appears well-developed and well-nourished. No distress.  HENT:  Head: Normocephalic and atraumatic.  Right Ear: External ear normal.  Left Ear: External ear normal.  Nose: Nose normal.  Mouth/Throat: Oropharynx is clear and moist.  Eyes: Pupils are equal, round, and reactive to light.  Neck: Normal range of motion. Neck supple. No thyromegaly present.  Cardiovascular: Normal rate, regular rhythm, normal heart sounds and intact distal pulses.   No murmur heard. Pulmonary/Chest: Effort normal and breath sounds normal. No respiratory distress. She has no wheezes.  Abdominal: Soft. Bowel sounds are normal. She exhibits no distension. There is no tenderness.    Musculoskeletal: Normal range of motion. She exhibits edema and tenderness.  Pt fell and fractured right foot- Pt in boot  Neurological: She is alert and oriented to person, place, and time. She  has normal reflexes. No cranial nerve deficit.  Skin: Skin is warm and dry.  Psychiatric: She has a normal mood and affect. Her behavior is normal. Judgment and thought content normal.  Vitals reviewed.     BP 136/57 mmHg  Pulse 60  Temp(Src) 97.2 F (36.2 C) (Oral)  Ht 5' 2.5" (1.588 m)  Wt 207 lb 12.8 oz (94.257 kg)  BMI 37.38 kg/m2  LMP  (Approximate)     Assessment & Plan:  1. Hyperlipidemia - CMP14+EGFR - Lipid panel  2. Acute diastolic CHF (congestive heart failure) - CMP14+EGFR  3. Coronary artery disease involving native coronary artery of native heart without angina pectoris - CMP14+EGFR  4. Diabetes mellitus with peripheral vascular disease - POCT glycosylated hemoglobin (Hb A1C) - CMP14+EGFR  5. Essential hypertension - CMP14+EGFR  6. Vaginal atrophy  - CMP14+EGFR  7. Post-menopausal - CMP14+EGFR - Vit D  25 hydroxy (rtn osteoporosis monitoring)   Continue all meds Labs pending Health Maintenance reviewed Diet and exercise encouraged RTO 3 months  Evelina Dun, FNP

## 2014-08-07 NOTE — Patient Instructions (Signed)

## 2014-08-08 ENCOUNTER — Other Ambulatory Visit: Payer: Self-pay | Admitting: Family

## 2014-08-08 LAB — LIPID PANEL
CHOL/HDL RATIO: 2.7 ratio (ref 0.0–4.4)
Cholesterol, Total: 176 mg/dL (ref 100–199)
HDL: 66 mg/dL (ref >39)
LDL Calculated: 74 mg/dL (ref 0–99)
Triglycerides: 179 mg/dL — ABNORMAL HIGH (ref 0–149)
VLDL Cholesterol Cal: 36 mg/dL (ref 5–40)

## 2014-08-08 LAB — CMP14+EGFR
ALT: 18 IU/L (ref 0–32)
AST: 14 IU/L (ref 0–40)
Albumin/Globulin Ratio: 1.3 (ref 1.1–2.5)
Albumin: 4.5 g/dL (ref 3.6–4.8)
Alkaline Phosphatase: 86 IU/L (ref 39–117)
BILIRUBIN TOTAL: 0.2 mg/dL (ref 0.0–1.2)
BUN/Creatinine Ratio: 20 (ref 11–26)
BUN: 13 mg/dL (ref 8–27)
CALCIUM: 9.9 mg/dL (ref 8.7–10.3)
CO2: 29 mmol/L (ref 18–29)
Chloride: 96 mmol/L — ABNORMAL LOW (ref 97–108)
Creatinine, Ser: 0.65 mg/dL (ref 0.57–1.00)
GFR calc Af Amer: 109 mL/min/{1.73_m2} (ref >59)
GFR calc non Af Amer: 95 mL/min/{1.73_m2} (ref >59)
GLUCOSE: 105 mg/dL — AB (ref 65–99)
Globulin, Total: 3.6 g/dL (ref 1.5–4.5)
POTASSIUM: 4.4 mmol/L (ref 3.5–5.2)
SODIUM: 141 mmol/L (ref 134–144)
Total Protein: 8.1 g/dL (ref 6.0–8.5)

## 2014-08-08 LAB — VITAMIN D 25 HYDROXY (VIT D DEFICIENCY, FRACTURES): Vit D, 25-Hydroxy: 41.9 ng/mL (ref 30.0–100.0)

## 2014-08-08 MED ORDER — CANAGLIFLOZIN 300 MG PO TABS: 300.0000 mg | ORAL_TABLET | Freq: Every day | ORAL | Status: DC

## 2014-08-28 ENCOUNTER — Other Ambulatory Visit: Payer: Self-pay | Admitting: Family

## 2014-09-26 ENCOUNTER — Other Ambulatory Visit: Payer: Self-pay | Admitting: Cardiovascular Disease

## 2014-09-26 NOTE — Telephone Encounter (Signed)
Please review for refill, [place] pt. 

## 2014-10-06 ENCOUNTER — Encounter: Payer: Self-pay | Admitting: Family

## 2014-10-09 ENCOUNTER — Encounter: Payer: Self-pay | Admitting: Family

## 2014-10-09 ENCOUNTER — Ambulatory Visit (INDEPENDENT_AMBULATORY_CARE_PROVIDER_SITE_OTHER)
Admission: RE | Admit: 2014-10-09 | Discharge: 2014-10-09 | Disposition: A | Discharge status: Home / Self Care | Payer: 59 | Source: Ambulatory Visit | Attending: Family | Admitting: Family

## 2014-10-09 ENCOUNTER — Ambulatory Visit (INDEPENDENT_AMBULATORY_CARE_PROVIDER_SITE_OTHER): Payer: 59 | Admitting: Family

## 2014-10-09 ENCOUNTER — Ambulatory Visit (HOSPITAL_COMMUNITY)
Admission: RE | Admit: 2014-10-09 | Discharge: 2014-10-09 | Disposition: A | Discharge status: Home / Self Care | Payer: 59 | Source: Ambulatory Visit | Attending: Family | Admitting: Family

## 2014-10-09 VITALS — BP 129/66 | HR 55 | Temp 97.3°F | Resp 14 | Ht 62.5 in | Wt 207.0 lb

## 2014-10-09 DIAGNOSIS — Z87891 Personal history of nicotine dependence: Secondary | ICD-10-CM | POA: Diagnosis not present

## 2014-10-09 DIAGNOSIS — I739 Peripheral vascular disease, unspecified: Secondary | ICD-10-CM | POA: Diagnosis not present

## 2014-10-09 DIAGNOSIS — Z95828 Presence of other vascular implants and grafts: Secondary | ICD-10-CM

## 2014-10-09 DIAGNOSIS — E1151 Type 2 diabetes mellitus with diabetic peripheral angiopathy without gangrene: Secondary | ICD-10-CM

## 2014-10-09 DIAGNOSIS — E1159 Type 2 diabetes mellitus with other circulatory complications: Secondary | ICD-10-CM | POA: Diagnosis not present

## 2014-10-09 DIAGNOSIS — E669 Obesity, unspecified: Secondary | ICD-10-CM | POA: Diagnosis not present

## 2014-10-09 DIAGNOSIS — I6523 Occlusion and stenosis of bilateral carotid arteries: Secondary | ICD-10-CM | POA: Diagnosis not present

## 2014-10-09 DIAGNOSIS — Z9889 Other specified postprocedural states: Secondary | ICD-10-CM

## 2014-10-09 DIAGNOSIS — R0989 Other specified symptoms and signs involving the circulatory and respiratory systems: Secondary | ICD-10-CM

## 2014-10-09 DIAGNOSIS — Z6830 Body mass index (BMI) 30.0-30.9, adult: Secondary | ICD-10-CM | POA: Diagnosis not present

## 2014-10-09 NOTE — Progress Notes (Signed)
[facility] HISTORY AND PHYSICAL   MRN : [id number]  History of Present Illness:   Leah Ferrell is a 64 y.o. female patient of Dr. Kellie Simmering who is s/p interposition graft replacement of her left common femoral artery from the external iliac to the profunda on 11-02-13. 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  She has some numbness in the medial aspect of her left thigh. She returns today for follow up. She was initially referred by Dr. Fletcher [name] for evaluation of severe claudication left leg. Patient had angiography which revealed a left common iliac stenosis which was treated with stenting. 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 She also has occlusion of the mid left SFA. She has a patent popliteal artery with three-vessel off. She was only able to ambulate 50 feet before stopping prior to revascuarization. She had to use a cart to shop. She has no rest pain or history of nonhealing ulcers infection or gangrene. She does have diabetes mellitus. She quit smoking in 2005. She had a cardiac stent placed and is on Plavix.  She was using her treadmill until she fractured her right ankle. She states that her left hip pain limits her walking and is due for another xray of this soon. She has a speech inflection and it is difficult to understand some of what she is saying.  She is seeing an endocrinologist for thyroid nodules, had a bx per pt.; states she will be getting yearly [place] of her thyroid.  Pt denies any history of stroke or TIA.   The patient reports New Medical or Surgical History:fractured right ankle in May 2016 which has halted her graduated walking program; she awaits approval from her podiatrist to resume walking/weight bearing.  Pt Diabetic: Yes, diagnosed with DM about 2000; Review of records: A1C in March 2016 was 7.4, improved from 8.7, almost in control Pt smoker: former  smoker, from 1966-2005  Pt meds include: Statin :Yes Betablocker: Yes ASA: Yes Other anticoagulants/antiplatelets: Plavix     Current Outpatient Prescriptions  Medication Sig Dispense Refill  . amLODipine (NORVASC) 10 MG tablet TAKE ONE TABLET BY MOUTH ONCE DAILY 30 tablet 5  . Ascorbic Acid (VITAMIN C) 500 MG CAPS Take 1 capsule by mouth daily.     Marland Kitchen aspirin EC 81 MG tablet Take 81 mg by mouth daily.     . Calcium Carbonate-Vit D-Min (CALTRATE 600+D PLUS MINERALS) 600-800 MG-UNIT TABS Take 1 tablet by mouth 2 (two) times daily.    . canagliflozin (INVOKANA) 300 MG TABS tablet Take 300 mg by mouth daily before breakfast. 90 tablet 3  . clopidogrel (PLAVIX) 75 MG tablet TAKE ONE TABLET BY MOUTH ONCE DAILY 90 tablet 0  . conjugated estrogens (PREMARIN) vaginal cream Place 1 Applicatorful vaginally daily. 42.5 g 3  . fish oil-omega-3 fatty acids 1000 MG capsule Take 1 g by mouth every morning.    . furosemide (LASIX) 40 MG tablet Take 1 tablet (40 mg total) by mouth daily. 90 tablet 4  . hydroxypropyl methylcellulose (ISOPTO TEARS) 2.5 % ophthalmic solution Place 1 drop into both eyes 3 (three) times daily as needed (for dry eyes).    . Hypromell-Glycerin-Naphazoline 0.8-0.25-0.012 % SOLN Apply 1 application to eye daily as needed (dry eyes).     . insulin aspart protamine - aspart (NOVOLOG MIX 70/30 FLEXPEN) (70-30) 100 UNIT/ML FlexPen Use 70 unit TID with meals 15 mL 0  . Insulin  Pen Needle (PEN NEEDLES 29GX1/2") 29G X 12MM MISC 1 Container by Does not apply route 3 (three) times daily. 50 each 11  . lisinopril (PRINIVIL,ZESTRIL) 20 MG tablet Take 1 tablet (20 mg total) by mouth 2 (two) times daily. 180 tablet 4  . metoprolol succinate (TOPROL-XL) 50 MG 24 hr tablet TAKE ONE & ONE-HALF TABLETS BY MOUTH TWICE DAILY 125 tablet 1  . montelukast (SINGULAIR) 10 MG tablet Take 1 tablet (10 mg total) by mouth at bedtime. 90 tablet 4  . Multiple Vitamin (MULTIVITAMIN WITH MINERALS) TABS Take 1  tablet by mouth daily.    . nitroGLYCERIN (NITROSTAT) 0.4 MG SL tablet Place 1 tablet (0.4 mg total) under the tongue every 5 (five) minutes x 3 doses as needed for chest pain (up to 3 doses). 25 tablet 4  . potassium chloride SA (K-DUR,KLOR-CON) 20 MEQ tablet Take 20 mEq by mouth daily.    Marland Kitchen PROVENTIL HFA 108 (90 BASE) MCG/ACT inhaler INHALE ONE TO TWO PUFFS BY MOUTH EVERY 6 HOURS AS NEEDED FOR WHEEZING OR FOR SHORTNESS OF BREATH 7 g 3  . rosuvastatin (CRESTOR) 20 MG tablet Take 1 tablet (20 mg total) by mouth daily. 90 tablet 3  . traMADol (ULTRAM) 50 MG tablet Take 1 tablet (50 mg total) by mouth every 6 (six) hours as needed for moderate pain. 30 tablet 0   No current facility-administered medications for this visit.    Past Medical History  Diagnosis Date  . Hypertension   . Diabetes mellitus without complication   . Hyperlipidemia   . CAD (coronary artery disease)     a. 04/2012 NSTEMI: s/p DES to LAD.  Marland Kitchen PVD (peripheral vascular disease)     a. 04/2012 ABI: R 0.49, L 0.39. Angiography 06/14: Significant ostial left common iliac artery stenosis extending into the distal aorta, severe left common femoral artery stenosis, bilateral SFA occlusion with heavy calcifications. Functionally, one-vessel runoff bilaterally below the knee  . Chronic diastolic CHF (congestive heart failure)   . Leukocytosis   . Morbid obesity   . NSTEMI (non-ST elevated myocardial infarction) 04/27/2012  . Dysrhythmia   . Shortness of breath   . PONV (postoperative nausea and vomiting)   . Environmental and seasonal allergies     Social History Social History  Substance Use Topics  . Smoking status: Former Smoker -- 1.00 packs/day for 36 years    Types: Cigarettes    Start date: 04/21/1964    Quit date: 02/17/2001  . Smokeless tobacco: Former Systems developer    Quit date: 04/20/2001  . Alcohol Use: No    Family History Family History  Problem Relation Age of Onset  . Diabetes Mother   . Hyperlipidemia Mother    . Hypertension Mother   . Peripheral vascular disease Mother     Surgical History Past Surgical History  Procedure Laterality Date  . 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 Lad stent    . Iliac artery stent Left 08/31/2013  . Cardiac catheterization      stents X 2  . Endarterectomy femoral Left 11/02/2013    Procedure: RESECTION LEFT COMMON FEMORAL ARTERY AND INSERTION OF INTERPOSITION 23mm HEMASHIELD GRAFT FROM LEFT EXTERNAL  ILIAC ARTERY TO LEFT PROFUNDA  FEMORIS ARTERY;  Surgeon: Mal Misty, MD;  Location: [facility];  Service: Vascular;  Laterality: Left;  . Left heart catheterization with coronary angiogram N/A 04/23/2012    Procedure: LEFT HEART CATHETERIZATION WITH CORONARY ANGIOGRAM;  Surgeon:  Peter M Martinique, MD;  Location: [facility] CATH LAB;  Service: Cardiovascular;  Laterality: N/A;  . Coronary angiogram  04/27/2012    Procedure: CORONARY ANGIOGRAM;  Surgeon: Thayer Headings, MD;  Location: [facility] CATH LAB;  Service: Cardiovascular;;  . Abdominal aortagram N/A 07/21/2012    Procedure: ABDOMINAL Maxcine Ham;  Surgeon: Wellington Hampshire, MD;  Location: [place] CATH LAB;  Service: Cardiovascular;  Laterality: N/A;  . Lower extremity angiogram Left 08/31/2013    Procedure: LOWER EXTREMITY ANGIOGRAM;  Surgeon: Wellington Hampshire, MD;  Location: [facility] CATH LAB;  Service: Cardiovascular;  Laterality: Left;  . Percutaneous stent intervention Left 08/31/2013    Procedure: PERCUTANEOUS STENT INTERVENTION;  Surgeon: Wellington Hampshire, MD;  Location: [facility] CATH LAB;  Service: Cardiovascular;  Laterality: Left;  Common Iliac artery  . Fracture surgery Right Jul 07, 2014    Right Foot-  Pt.fell  at Home  by Heat duct    Allergies  Allergen Reactions  . Tape Rash    Current Outpatient Prescriptions  Medication Sig Dispense Refill  . amLODipine (NORVASC) 10 MG tablet TAKE ONE TABLET BY MOUTH ONCE DAILY 30 tablet 5  . Ascorbic Acid (VITAMIN C) 500 MG CAPS Take 1 capsule by mouth daily.     Marland Kitchen aspirin EC 81 MG  tablet Take 81 mg by mouth daily.     . Calcium Carbonate-Vit D-Min (CALTRATE 600+D PLUS MINERALS) 600-800 MG-UNIT TABS Take 1 tablet by mouth 2 (two) times daily.    . canagliflozin (INVOKANA) 300 MG TABS tablet Take 300 mg by mouth daily before breakfast. 90 tablet 3  . clopidogrel (PLAVIX) 75 MG tablet TAKE ONE TABLET BY MOUTH ONCE DAILY 90 tablet 0  . conjugated estrogens (PREMARIN) vaginal cream Place 1 Applicatorful vaginally daily. 42.5 g 3  . fish oil-omega-3 fatty acids 1000 MG capsule Take 1 g by mouth every morning.    . furosemide (LASIX) 40 MG tablet Take 1 tablet (40 mg total) by mouth daily. 90 tablet 4  . hydroxypropyl methylcellulose (ISOPTO TEARS) 2.5 % ophthalmic solution Place 1 drop into both eyes 3 (three) times daily as needed (for dry eyes).    . Hypromell-Glycerin-Naphazoline 0.8-0.25-0.012 % SOLN Apply 1 application to eye daily as needed (dry eyes).     . insulin aspart protamine - aspart (NOVOLOG MIX 70/30 FLEXPEN) (70-30) 100 UNIT/ML FlexPen Use 70 unit TID with meals 15 mL 0  . Insulin Pen Needle (PEN NEEDLES 29GX1/2") 29G X 12MM MISC 1 Container by Does not apply route 3 (three) times daily. 50 each 11  . lisinopril (PRINIVIL,ZESTRIL) 20 MG tablet Take 1 tablet (20 mg total) by mouth 2 (two) times daily. 180 tablet 4  . metoprolol succinate (TOPROL-XL) 50 MG 24 hr tablet TAKE ONE & ONE-HALF TABLETS BY MOUTH TWICE DAILY 125 tablet 1  . montelukast (SINGULAIR) 10 MG tablet Take 1 tablet (10 mg total) by mouth at bedtime. 90 tablet 4  . Multiple Vitamin (MULTIVITAMIN WITH MINERALS) TABS Take 1 tablet by mouth daily.    . nitroGLYCERIN (NITROSTAT) 0.4 MG SL tablet Place 1 tablet (0.4 mg total) under the tongue every 5 (five) minutes x 3 doses as needed for chest pain (up to 3 doses). 25 tablet 4  . potassium chloride SA (K-DUR,KLOR-CON) 20 MEQ tablet Take 20 mEq by mouth daily.    Marland Kitchen PROVENTIL HFA 108 (90 BASE) MCG/ACT inhaler INHALE ONE TO TWO PUFFS BY MOUTH EVERY 6 HOURS  AS NEEDED FOR WHEEZING OR FOR SHORTNESS OF  BREATH 7 g 3  . rosuvastatin (CRESTOR) 20 MG tablet Take 1 tablet (20 mg total) by mouth daily. 90 tablet 3  . traMADol (ULTRAM) 50 MG tablet Take 1 tablet (50 mg total) by mouth every 6 (six) hours as needed for moderate pain. 30 tablet 0   No current facility-administered medications for this visit.     REVIEW OF SYSTEMS: See HPI for pertinent positives and negatives.  Physical Examination Filed Vitals:   10/09/14 1422 10/09/14 1425  BP: 129/63 129/66  Pulse: 57 55  Temp:  97.3 F (36.3 C)  TempSrc:  Oral  Resp:  14  Height:  5' 2.5" (1.588 m)  Weight:  207 lb (93.895 kg)  SpO2:  97%   Body mass index is 37.23 kg/(m^2).  General: A&O x 3, WDWN, obese female, central obesity. Gait: normal Eyes: PERRLA. Pulmonary: CTAB, without wheezes , rales or rhonchi. Cardiac: regular Rythm , without detected murmur.     Carotid Bruits Right Left   negative Negative  Aorta is not palpable. Radial pulses: 2+ palpable and =   VASCULAR EXAM: Extremities without ischemic changes, without Gangrene; without open wounds.     LE Pulses Right Left   FEMORAL not palpable(obese) not palpable (obese)    POPLITEAL not palpable  not palpable   POSTERIOR TIBIAL not palpable  not palpable    DORSALIS PEDIS  ANTERIOR TIBIAL not palpable  not palpable    Abdomen: soft, NT, no palpable masses, obese. Skin: no rashes, no ulcers. Musculoskeletal: no muscle wasting or atrophy. Neurologic: A&O X 3; Appropriate Affect ; SENSATION: normal; MOTOR FUNCTION: moving all extremities equally, motor strength 5/5 throughout. Speech is fluent/normal. CN 2-12 intact. Speech is difficult to understand at times, pt is talkative.           Non-Invasive Vascular Imaging (10/09/14):   LOWER EXTREMITY ARTERIAL DUPLEX EVALUATION    INDICATION: Peripheral vascular disease     PREVIOUS INTERVENTION(S): Resection left common femoral artery and insertion of interposition graft from the left external iliac to the profunda artery 2015.    DUPLEX EXAM:     RIGHT  LEFT   Peak Systolic Velocity (cm/s) Ratio (if abnormal) Waveform  Peak Systolic Velocity (cm/s) Ratio (if abnormal) Waveform     Inflow Artery 152  M      Proximal Anastomosis 125  M     Proximal Graft 84  M     Mid Graft 77  M      Distal Graft 60  M     Distal Anastomosis 104  M     Outflow Artery 136  M  0.60 Today's ABI / TBI 0.57  0.53 Previous ABI / TBI (07/06/2014  ) 0.52    Waveform:    M - Monophasic       B - Biphasic       T - Triphasic  If Ankle Brachial Index (ABI) or Toe Brachial Index (TBI) performed, please see complete report  ADDITIONAL FINDINGS:     IMPRESSION: Patent left external iliac to profunda femoral artery graft without evidence of stenosis. Known left superficial femoral artery occlusive disease.    Compared to the previous exam:  No significant change in comparison to the last exam on 07/06/2014.     CEREBROVASCULAR DUPLEX EVALUATION    INDICATION: Carotid bruit     PREVIOUS INTERVENTION(S):     DUPLEX EXAM:     RIGHT  LEFT  Peak Systolic Velocities (cm/s) End Diastolic Velocities (  cm/s) Plaque LOCATION Peak Systolic Velocities (cm/s) End Diastolic Velocities (cm/s) Plaque  79 8  CCA PROXIMAL 69 10   60 9  CCA MID 60 8   65 9  CCA DISTAL 59 9   91 9  ECA 96 8   86 13 HT ICA PROXIMAL 61 10 HT/CP  59 12  ICA MID 52 13   70 14  ICA DISTAL 59 12     1.43 ICA / CCA Ratio (PSV) 1.01  Antegrade  Vertebral Flow Antegrade   258 Brachial Systolic Pressure (mmHg) 527  Multiphasic (Subclavian artery) Brachial Artery Waveforms Multiphasic (Subclavian artery)    Plaque Morphology:  HM = Homogeneous, HT = Heterogeneous, CP =  Calcific Plaque, SP = Smooth Plaque, IP = Irregular Plaque  ADDITIONAL FINDINGS:     IMPRESSION: Bilateral internal carotid artery velocities suggest a <40% stenosis.     Compared to the previous exam:  No prior exam performed at this facility for comparison.      ASSESSMENT:  Leah Ferrell is a 64 y.o. female who is s/p interposition graft replacement of her left common femoral artery from the external iliac to the profunda on 11-02-13. 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 Angiography also revealed a left common iliac stenosis which was treated with stenting.  She is not walking much lately due to fracturing her ankle in May of 2016. Today's left LE arterial Duplex suggests a patent left external iliac to profunda femoral artery graft without evidence of stenosis, known left superficial femoral artery occlusive disease. No significant change in comparison to the last exam on 07/06/2014. Bilateral ABI's remain stable with moderate arterial occlusive disease, all monophasic waveforms.   Carotid bruit heard at her last visit but not heard today. Pt has no history of stroke or TIA. Today's carotid Duplex suggests minimal bilateral ICA stenosis. Her atherosclerotic risk factors include improving DM almost in control, former smoker, CAD, and central obesity.  Face to face time with patient was 25 minutes. Over 50% of this time was spent on counseling and coordination of care.   PLAN:   Resume graduated walking program when cleared by her ankle surgeon.  Based on today's exam and non-invasive vascular lab results, the patient will follow up in 6 months with the following tests: ABI's and left LE arterial Duplex; carotid Duplex in a year. She knows to return sooner for concerns re circulation in her feet and legs.  I discussed in depth with the patient the nature of atherosclerosis, and emphasized the importance of maximal medical  management including strict control of blood pressure, blood glucose, and lipid levels, obtaining regular exercise, and cessation of smoking.  The patient is aware that without maximal medical management the underlying atherosclerotic disease process will progress, limiting the benefit of any interventions.  The patient was given information about stroke prevention and what symptoms should prompt the patient to seek immediate medical care.  The patient was given information about PAD including signs, symptoms, treatment, what symptoms should prompt the patient to seek immediate medical care, and risk reduction measures to take. Thank you for allowing [place] to participate in this patient's care.  Clemon Chambers, RN, MSN, FNP-C [facility] Office: [phone]  Clinic MD: Trula Slade  10/09/2014 2:37 PM

## 2014-10-09 NOTE — Patient Instructions (Signed)

## 2014-10-17 ENCOUNTER — Encounter: Payer: Self-pay | Admitting: Cardiovascular Disease

## 2014-10-17 ENCOUNTER — Ambulatory Visit (INDEPENDENT_AMBULATORY_CARE_PROVIDER_SITE_OTHER): Payer: 59 | Admitting: Cardiovascular Disease

## 2014-10-17 VITALS — BP 132/72 | HR 59 | Ht 62.5 in | Wt 206.5 lb

## 2014-10-17 DIAGNOSIS — E785 Hyperlipidemia, unspecified: Secondary | ICD-10-CM

## 2014-10-17 DIAGNOSIS — I2510 Atherosclerotic heart disease of native coronary artery without angina pectoris: Secondary | ICD-10-CM

## 2014-10-17 DIAGNOSIS — I10 Essential (primary) hypertension: Secondary | ICD-10-CM | POA: Diagnosis not present

## 2014-10-17 DIAGNOSIS — I739 Peripheral vascular disease, unspecified: Secondary | ICD-10-CM | POA: Diagnosis not present

## 2014-10-17 NOTE — Assessment & Plan Note (Signed)
Lab Results  Component Value Date   CHOL 176 08/07/2014   HDL 66 08/07/2014   LDLCALC 74 08/07/2014   TRIG 179* 08/07/2014   CHOLHDL 2.7 08/07/2014   Continue treatment with rosuvastatin 20 mg daily.

## 2014-10-17 NOTE — Progress Notes (Signed)
HPI  This is a pleasant 64 year old female who is here today for a followup visit regarding peripheral arterial disease.  She has known history of coronary artery disease status post drug-eluting stent placement to the LAD. She had recurrent chest pain in June, 2015 and was transferred from [facility] to [facility]. She underwent cardiac catheterization which showed severe in-stent restenosis. She had another drug-eluting stent placement. She has known history of peripheral arterial disease.  I proceeded with angiography which angiography in July 2015 showed stable severe left iliac disease with worsening of left common/ostial SFA/ostial profunda disease with chronically occluded mid SFA.  I performed stent placement to left common iliac artery without complications. She had slight improvement in claudication. She then underwent left common femoral artery endarterectomy by Dr. Kellie Simmering in September. She had almost complete resolution of claudication since then.  She fell in May and had right ankle fracture. This has improved since then. She denies chest pain or shortness of breath. She had carotid Doppler done at [facility] which showed mild less than 40% stenosis bilaterally. Recent ABIs were stable.    Allergies  Allergen Reactions  . Tape Rash     Current Outpatient Prescriptions on File Prior to Visit  Medication Sig Dispense Refill  . amLODipine (NORVASC) 10 MG tablet TAKE ONE TABLET BY MOUTH ONCE DAILY 30 tablet 5  . Ascorbic Acid (VITAMIN C) 500 MG CAPS Take 1 capsule by mouth daily.     Marland Kitchen aspirin EC 81 MG tablet Take 81 mg by mouth daily.     . Calcium Carbonate-Vit D-Min (CALTRATE 600+D PLUS MINERALS) 600-800 MG-UNIT TABS Take 1 tablet by mouth 2 (two) times daily.    . canagliflozin (INVOKANA) 300 MG TABS tablet Take 300 mg by mouth daily before breakfast. 90 tablet 3  . clopidogrel (PLAVIX) 75 MG tablet TAKE ONE TABLET BY MOUTH ONCE DAILY 90 tablet 0  . conjugated  estrogens (PREMARIN) vaginal cream Place 1 Applicatorful vaginally daily. 42.5 g 3  . fish oil-omega-3 fatty acids 1000 MG capsule Take 1 g by mouth every morning.    . furosemide (LASIX) 40 MG tablet Take 1 tablet (40 mg total) by mouth daily. 90 tablet 4  . hydroxypropyl methylcellulose (ISOPTO TEARS) 2.5 % ophthalmic solution Place 1 drop into both eyes 3 (three) times daily as needed (for dry eyes).    . Hypromell-Glycerin-Naphazoline 0.8-0.25-0.012 % SOLN Apply 1 application to eye daily as needed (dry eyes).     . insulin aspart protamine - aspart (NOVOLOG MIX 70/30 FLEXPEN) (70-30) 100 UNIT/ML FlexPen Use 70 unit TID with meals 15 mL 0  . Insulin Pen Needle (PEN NEEDLES 29GX1/2") 29G X 12MM MISC 1 Container by Does not apply route 3 (three) times daily. 50 each 11  . lisinopril (PRINIVIL,ZESTRIL) 20 MG tablet Take 1 tablet (20 mg total) by mouth 2 (two) times daily. 180 tablet 4  . metoprolol succinate (TOPROL-XL) 50 MG 24 hr tablet TAKE ONE & ONE-HALF TABLETS BY MOUTH TWICE DAILY 125 tablet 1  . montelukast (SINGULAIR) 10 MG tablet Take 1 tablet (10 mg total) by mouth at bedtime. 90 tablet 4  . Multiple Vitamin (MULTIVITAMIN WITH MINERALS) TABS Take 1 tablet by mouth daily.    . nitroGLYCERIN (NITROSTAT) 0.4 MG SL tablet Place 1 tablet (0.4 mg total) under the tongue every 5 (five) minutes x 3 doses as needed for chest pain (up to 3 doses). 25 tablet 4  . potassium chloride SA (K-DUR,KLOR-CON)  20 MEQ tablet Take 20 mEq by mouth daily.    Marland Kitchen PROVENTIL HFA 108 (90 BASE) MCG/ACT inhaler INHALE ONE TO TWO PUFFS BY MOUTH EVERY 6 HOURS AS NEEDED FOR WHEEZING OR FOR SHORTNESS OF BREATH 7 g 3  . rosuvastatin (CRESTOR) 20 MG tablet Take 1 tablet (20 mg total) by mouth daily. 90 tablet 3  . traMADol (ULTRAM) 50 MG tablet Take 1 tablet (50 mg total) by mouth every 6 (six) hours as needed for moderate pain. 30 tablet 0   No current facility-administered medications on file prior to visit.     Past  Medical History  Diagnosis Date  . Hypertension   . Diabetes mellitus without complication   . Hyperlipidemia   . CAD (coronary artery disease)     a. 04/2012 NSTEMI: s/p DES to LAD.  Marland Kitchen PVD (peripheral vascular disease)     a. 04/2012 ABI: R 0.49, L 0.39. Angiography 06/14: Significant ostial left common iliac artery stenosis extending into the distal aorta, severe left common femoral artery stenosis, bilateral SFA occlusion with heavy calcifications. Functionally, one-vessel runoff bilaterally below the knee  . Chronic diastolic CHF (congestive heart failure)   . Leukocytosis   . Morbid obesity   . NSTEMI (non-ST elevated myocardial infarction) 04/27/2012  . Dysrhythmia   . Shortness of breath   . PONV (postoperative nausea and vomiting)   . Environmental and seasonal allergies      Past Surgical History  Procedure Laterality Date  . 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 Lad stent    . Iliac artery stent Left 08/31/2013  . Cardiac catheterization      stents X 2  . Endarterectomy femoral Left 11/02/2013    Procedure: RESECTION LEFT COMMON FEMORAL ARTERY AND INSERTION OF INTERPOSITION 23mm HEMASHIELD GRAFT FROM LEFT EXTERNAL  ILIAC ARTERY TO LEFT PROFUNDA  FEMORIS ARTERY;  Surgeon: Mal Misty, MD;  Location: [place];  Service: Vascular;  Laterality: Left;  . Left heart catheterization with coronary angiogram N/A 04/23/2012    Procedure: LEFT HEART CATHETERIZATION WITH CORONARY ANGIOGRAM;  Surgeon: Peter M Martinique, MD;  Location: [facility] CATH LAB;  Service: Cardiovascular;  Laterality: N/A;  . Coronary angiogram  04/27/2012    Procedure: CORONARY ANGIOGRAM;  Surgeon: Thayer Headings, MD;  Location: [facility] CATH LAB;  Service: Cardiovascular;;  . Abdominal aortagram N/A 07/21/2012    Procedure: ABDOMINAL Maxcine Ham;  Surgeon: Wellington Hampshire, MD;  Location: [place] CATH LAB;  Service: Cardiovascular;  Laterality: N/A;  . Lower extremity angiogram Left 08/31/2013    Procedure: LOWER EXTREMITY  ANGIOGRAM;  Surgeon: Wellington Hampshire, MD;  Location: [place] CATH LAB;  Service: Cardiovascular;  Laterality: Left;  . Percutaneous stent intervention Left 08/31/2013    Procedure: PERCUTANEOUS STENT INTERVENTION;  Surgeon: Wellington Hampshire, MD;  Location: [place] CATH LAB;  Service: Cardiovascular;  Laterality: Left;  Common Iliac artery  . Fracture surgery Right Jul 07, 2014    Right Foot-  Pt.fell  at Home  by Heat duct     Family History  Problem Relation Age of Onset  . Diabetes Mother   . Hyperlipidemia Mother   . Hypertension Mother   . Peripheral vascular disease Mother      Social History   Social History  . Marital Status: Married    Spouse Name: N/A  . Number of Children: N/A  . Years of Education: N/A   Occupational History  . Not on file.  Social History Main Topics  . Smoking status: Former Smoker -- 1.00 packs/day for 36 years    Types: Cigarettes    Start date: 04/21/1964    Quit date: 02/17/2001  . Smokeless tobacco: Former Systems developer    Quit date: 04/20/2001  . Alcohol Use: No  . Drug Use: No  . Sexual Activity: Not on file   Other Topics Concern  . Not on file   Social History Narrative      PHYSICAL EXAM   BP 132/72 mmHg  Pulse 59  Ht 5' 2.5" (1.588 m)  Wt 206 lb 8 oz (93.668 kg)  BMI 37.14 kg/m2  LMP  (Approximate) Constitutional: She is oriented to person, place, and time. She appears well-developed and well-nourished. No distress.  HENT: No nasal discharge.  Head: Normocephalic and atraumatic.  Eyes: Pupils are equal and round. Right eye exhibits no discharge. Left eye exhibits no discharge.  Neck: Normal range of motion. Neck supple. No JVD present. No thyromegaly present.  Cardiovascular: Normal rate, regular rhythm, normal heart sounds. Exam reveals no gallop and no friction rub. No murmur heard.  Pulmonary/Chest: Effort normal and breath sounds normal. No stridor. No respiratory distress. She has no wheezes. She has no rales. She exhibits no  tenderness.  Abdominal: Soft. Bowel sounds are normal. She exhibits no distension. There is no tenderness. There is no rebound and no guarding.  Musculoskeletal: Normal range of motion. She exhibits trace edema involving the left leg with mild tenderness.   Neurological: She is alert and oriented to person, place, and time. Coordination normal.  Skin: Skin is warm and dry. No rash noted. She is not diaphoretic. No erythema. No pallor.  Psychiatric: She has a normal mood and affect. Her behavior is normal. Judgment and thought content normal.  Vascular: Femoral pulses  +2 bilaterally. Distal pulses are not palpable.   EKG: Sinus bradycardia with no significant ST or T wave changes.  ASSESSMENT AND PLAN

## 2014-10-17 NOTE — Assessment & Plan Note (Signed)
She is doing very well with no recurrent claudication. Recent ABI was stable. I recommend continuing aggressive medical therapy. Follow-up with me on a yearly basis or earlier if needed.

## 2014-10-17 NOTE — Assessment & Plan Note (Signed)
She is doing well with no symptoms suggestive of angina. I favor continuing long-term dual antiplatelets therapy given her extensive vascular disease.

## 2014-10-17 NOTE — Assessment & Plan Note (Signed)
Blood pressure is well controlled on current medications. 

## 2014-10-17 NOTE — Patient Instructions (Signed)
Medication Instructions:  Your physician recommends that you continue on your current medications as directed. Please refer to the Current Medication list given to you today.   Labwork: none  Testing/Procedures: none  Follow-Up: Your physician wants you to follow-up in: 12 months.  You will receive a reminder letter in the mail two months in advance. If you don't receive a letter, please call our office to schedule the follow-up appointment.   Any Other Special Instructions Will Be Listed Below (If Applicable).   

## 2014-10-18 ENCOUNTER — Other Ambulatory Visit: Payer: Self-pay | Admitting: Family

## 2014-10-24 ENCOUNTER — Other Ambulatory Visit: Payer: Self-pay | Admitting: Family

## 2014-11-06 ENCOUNTER — Encounter: Payer: Self-pay | Admitting: Cardiovascular Disease

## 2014-11-08 ENCOUNTER — Ambulatory Visit: Payer: 59 | Admitting: Family

## 2014-11-10 ENCOUNTER — Other Ambulatory Visit: Payer: Self-pay | Admitting: Family

## 2014-11-13 ENCOUNTER — Telehealth: Payer: Self-pay | Admitting: Family

## 2014-11-13 MED ORDER — INSULIN ASPART PROT & ASPART (70-30 MIX) 100 UNIT/ML PEN: PEN_INJECTOR | SUBCUTANEOUS | Status: DC

## 2014-11-13 NOTE — Telephone Encounter (Signed)
Patient aware samples in refig.

## 2014-11-20 ENCOUNTER — Other Ambulatory Visit: Payer: Self-pay | Admitting: Family

## 2014-11-23 ENCOUNTER — Encounter: Payer: Self-pay | Admitting: Family

## 2014-11-23 ENCOUNTER — Ambulatory Visit (INDEPENDENT_AMBULATORY_CARE_PROVIDER_SITE_OTHER): Payer: 59

## 2014-11-23 ENCOUNTER — Ambulatory Visit (INDEPENDENT_AMBULATORY_CARE_PROVIDER_SITE_OTHER): Payer: 59 | Admitting: Family

## 2014-11-23 VITALS — BP 127/59 | HR 59 | Temp 98.2°F | Ht 62.5 in | Wt 207.0 lb

## 2014-11-23 DIAGNOSIS — R0989 Other specified symptoms and signs involving the circulatory and respiratory systems: Secondary | ICD-10-CM | POA: Diagnosis not present

## 2014-11-23 DIAGNOSIS — I10 Essential (primary) hypertension: Secondary | ICD-10-CM

## 2014-11-23 DIAGNOSIS — I2510 Atherosclerotic heart disease of native coronary artery without angina pectoris: Secondary | ICD-10-CM

## 2014-11-23 DIAGNOSIS — E875 Hyperkalemia: Secondary | ICD-10-CM | POA: Diagnosis not present

## 2014-11-23 DIAGNOSIS — E876 Hypokalemia: Secondary | ICD-10-CM | POA: Insufficient documentation

## 2014-11-23 DIAGNOSIS — E1151 Type 2 diabetes mellitus with diabetic peripheral angiopathy without gangrene: Secondary | ICD-10-CM

## 2014-11-23 DIAGNOSIS — N952 Postmenopausal atrophic vaginitis: Secondary | ICD-10-CM | POA: Diagnosis not present

## 2014-11-23 DIAGNOSIS — I739 Peripheral vascular disease, unspecified: Secondary | ICD-10-CM | POA: Diagnosis not present

## 2014-11-23 DIAGNOSIS — I5032 Chronic diastolic (congestive) heart failure: Secondary | ICD-10-CM

## 2014-11-23 DIAGNOSIS — M25552 Pain in left hip: Secondary | ICD-10-CM

## 2014-11-23 DIAGNOSIS — B372 Candidiasis of skin and nail: Secondary | ICD-10-CM

## 2014-11-23 DIAGNOSIS — E785 Hyperlipidemia, unspecified: Secondary | ICD-10-CM | POA: Diagnosis not present

## 2014-11-23 LAB — POCT GLYCOSYLATED HEMOGLOBIN (HGB A1C): Hemoglobin A1C: 7.6

## 2014-11-23 MED ORDER — NAFTIFINE HCL 2 % EX CREA: TOPICAL_CREAM | CUTANEOUS | Status: DC

## 2014-11-23 MED ORDER — POTASSIUM CHLORIDE CRYS ER 20 MEQ PO TBCR: 20.0000 meq | EXTENDED_RELEASE_TABLET | Freq: Every day | ORAL | Status: DC

## 2014-11-23 NOTE — Progress Notes (Signed)
Subjective:    Patient ID: Leah Ferrell, female    DOB: 09-Dec-1950, 64 y.o.   MRN: [id number]  Pt presents to the office today for chronic follow up.  Diabetes She presents for her follow-up diabetic visit. She has type 2 diabetes mellitus. Her disease course has been stable. There are no hypoglycemic associated symptoms. Pertinent negatives for hypoglycemia include no headaches. Associated symptoms include fatigue and foot paresthesias. Pertinent negatives for diabetes include no blurred vision, no foot ulcerations and no visual change. There are no hypoglycemic complications. Diabetic complications include heart disease and peripheral neuropathy. Pertinent negatives for diabetic complications include no CVA or nephropathy. Risk factors for coronary artery disease include dyslipidemia, diabetes mellitus, hypertension, obesity and post-menopausal. Current diabetic treatment includes insulin injections and oral agent (dual therapy). She is compliant with treatment all of the time. Her weight is stable. She is following a generally healthy diet. Her breakfast blood glucose range is generally 130-140 mg/dl. An ACE inhibitor/angiotensin II receptor blocker is being taken. She sees a podiatrist.Eye exam is current.  Hypertension This is a chronic problem. The current episode started more than 1 year ago. The problem has been resolved since onset. The problem is controlled. Associated symptoms include palpitations. Pertinent negatives include no blurred vision, headaches or shortness of breath. Risk factors for coronary artery disease include diabetes mellitus, dyslipidemia, family history, post-menopausal state and obesity. Past treatments include beta blockers, calcium channel blockers and ACE inhibitors. The current treatment provides moderate improvement. Hypertensive end-organ damage includes CAD/MI. There is no history of CVA, heart failure or a thyroid problem.  Hyperlipidemia This is a chronic  problem. The current episode started more than 1 year ago. The problem is controlled. Recent lipid tests were reviewed and are normal. Exacerbating diseases include diabetes and obesity. She has no history of hypothyroidism. Factors aggravating her hyperlipidemia include beta blockers. Associated symptoms include leg pain. Pertinent negatives include no shortness of breath. Current antihyperlipidemic treatment includes statins. The current treatment provides moderate improvement of lipids. Compliance problems include adherence to diet and medication cost.  Risk factors for coronary artery disease include diabetes mellitus, dyslipidemia, family history, hypertension, obesity and post-menopausal.      Review of Systems  Constitutional: Positive for fatigue.  HENT: Negative.   Eyes: Negative.  Negative for blurred vision.  Respiratory: Negative.  Negative for shortness of breath.   Cardiovascular: Positive for palpitations.  Gastrointestinal: Negative.   Endocrine: Negative.   Genitourinary: Negative.   Musculoskeletal: Negative.   Neurological: Negative.  Negative for headaches.  Hematological: Negative.   Psychiatric/Behavioral: Negative.   All other systems reviewed and are negative.      Objective:   Physical Exam  Constitutional: She is oriented to person, place, and time. She appears well-developed and well-nourished. No distress.  HENT:  Head: Normocephalic and atraumatic.  Right Ear: External ear normal.  Left Ear: External ear normal.  Nose: Nose normal.  Mouth/Throat: Oropharynx is clear and moist.  Eyes: Pupils are equal, round, and reactive to light.  Neck: Normal range of motion. Neck supple. No thyromegaly present.  Cardiovascular: Normal rate, regular rhythm, normal heart sounds and intact distal pulses.   No murmur heard. Pulmonary/Chest: Effort normal and breath sounds normal. No respiratory distress. She has no wheezes.  Abdominal: Soft. Bowel sounds are normal. She  exhibits no distension. There is no tenderness.  Musculoskeletal: Normal range of motion. She exhibits no edema or tenderness.  Neurological: She is alert and oriented to person, place, and  time. She has normal reflexes. No cranial nerve deficit.  Skin: Skin is warm and dry.  Psychiatric: She has a normal mood and affect. Her behavior is normal. Judgment and thought content normal.  Vitals reviewed.   BP 127/59 mmHg  Pulse 59  Temp(Src) 98.2 F (36.8 C) (Oral)  Ht 5' 2.5" (1.588 m)  Wt 207 lb (93.895 kg)  BMI 37.23 kg/m2  LMP  (Approximate)  Left hip- Arthritic changes-Preliminary reading by Evelina Dun, FNP [facility]      Assessment & Plan:  1. Essential hypertension - CMP14+EGFR  2. Coronary artery disease involving native coronary artery of native heart without angina pectoris - CMP14+EGFR - Lipid panel  3. PAD (peripheral artery disease) (HCC) - CMP14+EGFR  4. Vaginal atrophy - CMP14+EGFR  5. Hyperlipidemia - CMP14+EGFR - Lipid panel  6. Bilateral carotid bruits - CMP14+EGFR  7. Chronic diastolic heart failure (HCC) - CMP14+EGFR  8. PVD (peripheral vascular disease) (HCC) - CMP14+EGFR - Lipid panel  9. Diabetes mellitus with peripheral vascular disease (HCC) - CMP14+EGFR - POCT glycosylated hemoglobin (Hb A1C)  10. Skin yeast infection - CMP14+EGFR - Naftifine HCl 2 % CREA; Apply to bilateral feet BID  Dispense: 45 g; Refill: 1  11. Hyperkalemia - CMP14+EGFR - potassium chloride SA (K-DUR,KLOR-CON) 20 MEQ tablet; Take 1 tablet (20 mEq total) by mouth daily.  Dispense: 90 tablet; Refill: 1   Continue all meds Labs pending Health Maintenance reviewed Diet and exercise encouraged RTO 3 month  Evelina Dun, FNP

## 2014-11-23 NOTE — Patient Instructions (Signed)

## 2014-11-24 ENCOUNTER — Other Ambulatory Visit: Payer: Self-pay | Admitting: Family

## 2014-11-24 LAB — LIPID PANEL
CHOL/HDL RATIO: 2.4 ratio (ref 0.0–4.4)
Cholesterol, Total: 146 mg/dL (ref 100–199)
HDL: 60 mg/dL (ref >39)
LDL Calculated: 64 mg/dL (ref 0–99)
Triglycerides: 110 mg/dL (ref 0–149)
VLDL Cholesterol Cal: 22 mg/dL (ref 5–40)

## 2014-11-24 LAB — CMP14+EGFR
A/G RATIO: 1.3 (ref 1.1–2.5)
ALBUMIN: 4.4 g/dL (ref 3.6–4.8)
ALT: 21 IU/L (ref 0–32)
AST: 16 IU/L (ref 0–40)
Alkaline Phosphatase: 72 IU/L (ref 39–117)
BILIRUBIN TOTAL: 0.3 mg/dL (ref 0.0–1.2)
BUN / CREAT RATIO: 21 (ref 11–26)
BUN: 18 mg/dL (ref 8–27)
CHLORIDE: 96 mmol/L — AB (ref 97–108)
CO2: 31 mmol/L — ABNORMAL HIGH (ref 18–29)
Calcium: 9.8 mg/dL (ref 8.7–10.3)
Creatinine, Ser: 0.84 mg/dL (ref 0.57–1.00)
GFR calc non Af Amer: 74 mL/min/{1.73_m2} (ref >59)
GFR, EST AFRICAN AMERICAN: 86 mL/min/{1.73_m2} (ref >59)
GLOBULIN, TOTAL: 3.5 g/dL (ref 1.5–4.5)
Glucose: 91 mg/dL (ref 65–99)
POTASSIUM: 4.4 mmol/L (ref 3.5–5.2)
SODIUM: 140 mmol/L (ref 134–144)
TOTAL PROTEIN: 7.9 g/dL (ref 6.0–8.5)

## 2014-12-08 ENCOUNTER — Other Ambulatory Visit: Payer: Self-pay | Admitting: Family

## 2014-12-15 ENCOUNTER — Encounter: Payer: Self-pay | Admitting: Cardiology

## 2014-12-15 ENCOUNTER — Ambulatory Visit (INDEPENDENT_AMBULATORY_CARE_PROVIDER_SITE_OTHER): Payer: 59 | Admitting: Cardiology

## 2014-12-15 VITALS — BP 120/66 | HR 62 | Ht 62.0 in | Wt 212.0 lb

## 2014-12-15 DIAGNOSIS — I739 Peripheral vascular disease, unspecified: Secondary | ICD-10-CM

## 2014-12-15 DIAGNOSIS — I2510 Atherosclerotic heart disease of native coronary artery without angina pectoris: Secondary | ICD-10-CM | POA: Diagnosis not present

## 2014-12-15 DIAGNOSIS — I10 Essential (primary) hypertension: Secondary | ICD-10-CM | POA: Diagnosis not present

## 2014-12-15 DIAGNOSIS — E785 Hyperlipidemia, unspecified: Secondary | ICD-10-CM | POA: Diagnosis not present

## 2014-12-15 NOTE — Progress Notes (Signed)
Patient ID: Leah Ferrell, female   DOB: [birth year], 64 y.o.   MRN: [id number]     Clinical Summary Leah Ferrell is a 64 y.o.female seen today for follow up of the following medical problems.   1. CAD  - prior NSTEMI 04/2012 with DES to LAD  - 07/21/2013 cath at [facility] after abnormal nuclear stress test. Succesfull pci to LAD with DES.   - denies any chest pain. Walking treadmill 30 minutes a day.  - compliant with meds. She is on longterm DAPT  2. PAD  - followed by Dr Fletcher [name] as well as Dr Kellie Simmering, recent stenting of left common iliac. Continued symptoms, referred to vascular. She received a graft from left external iliac to left profunda femoris by Dr Kellie Simmering 11/02/13.  - denies any recent leg pain.    3. Chronic diastolic heart failure  - compliant with diuretic, limits sodium intake.  - denies any significant SOB or DOE - weight at home stable around 212 lbs.   4. HTN  - does not check regularly  - compliant with meds   5. Hyperlipidemia  - compliant with statin  11/2014: TC 146 TG 110 HDL 60 LDL 64 Past Medical History  Diagnosis Date  . Hypertension   . Diabetes mellitus without complication ([place])   . Hyperlipidemia   . CAD (coronary artery disease)     a. 04/2012 NSTEMI: s/p DES to LAD.  Marland Kitchen PVD (peripheral vascular disease) ([place])     a. 04/2012 ABI: R 0.49, L 0.39. Angiography 06/14: Significant ostial left common iliac artery stenosis extending into the distal aorta, severe left common femoral artery stenosis, bilateral SFA occlusion with heavy calcifications. Functionally, one-vessel runoff bilaterally below the knee  . Chronic diastolic CHF (congestive heart failure) ([place])   . Leukocytosis   . Morbid obesity ([place])   . NSTEMI (non-ST elevated myocardial infarction) ([place]) 04/27/2012  . Dysrhythmia   . Shortness of breath   . PONV (postoperative nausea and vomiting)   . Environmental and seasonal allergies      Allergies  Allergen Reactions  .  Tape Rash     Current Outpatient Prescriptions  Medication Sig Dispense Refill  . amLODipine (NORVASC) 10 MG tablet TAKE ONE TABLET BY MOUTH ONCE DAILY 30 tablet 3  . Ascorbic Acid (VITAMIN C) 500 MG CAPS Take 1 capsule by mouth daily.     Marland Kitchen aspirin EC 81 MG tablet Take 81 mg by mouth daily.     . Calcium Carbonate-Vit D-Min (CALTRATE 600+D PLUS MINERALS) 600-800 MG-UNIT TABS Take 1 tablet by mouth 2 (two) times daily.    . canagliflozin (INVOKANA) 300 MG TABS tablet Take 300 mg by mouth daily before breakfast. 90 tablet 3  . clopidogrel (PLAVIX) 75 MG tablet TAKE ONE TABLET BY MOUTH ONCE DAILY 90 tablet 0  . conjugated estrogens (PREMARIN) vaginal cream Place 1 Applicatorful vaginally daily. 42.5 g 3  . CRESTOR 20 MG tablet TAKE ONE TABLET BY MOUTH ONCE DAILY 90 tablet 0  . fish oil-omega-3 fatty acids 1000 MG capsule Take 1 g by mouth every morning.    . furosemide (LASIX) 40 MG tablet Take 1 tablet (40 mg total) by mouth daily. 90 tablet 4  . hydroxypropyl methylcellulose (ISOPTO TEARS) 2.5 % ophthalmic solution Place 1 drop into both eyes 3 (three) times daily as needed (for dry eyes).    . Hypromell-Glycerin-Naphazoline 0.8-0.25-0.012 % SOLN Apply 1 application to eye daily as needed (dry eyes).     Marland Kitchen  insulin aspart protamine - aspart (NOVOLOG MIX 70/30 FLEXPEN) (70-30) 100 UNIT/ML FlexPen Use 70 unit TID with meals 15 mL 0  . insulin aspart protamine - aspart (NOVOLOG MIX 70/30 FLEXPEN) (70-30) 100 UNIT/ML FlexPen INJECT 98 UNITS THREE TIMES A DAY WITH MEALS 90 mL 0  . Insulin Pen Needle (PEN NEEDLES 29GX1/2") 29G X 12MM MISC 1 Container by Does not apply route 3 (three) times daily. 50 each 11  . lisinopril (PRINIVIL,ZESTRIL) 20 MG tablet TAKE ONE TABLET BY MOUTH TWICE DAILY 180 tablet 1  . metoprolol succinate (TOPROL-XL) 50 MG 24 hr tablet TAKE ONE & ONE-HALF TABLETS BY MOUTH TWICE DAILY 125 tablet 2  . montelukast (SINGULAIR) 10 MG tablet Take 1 tablet (10 mg total) by mouth at  bedtime. 90 tablet 4  . Multiple Vitamin (MULTIVITAMIN WITH MINERALS) TABS Take 1 tablet by mouth daily.    . Naftifine HCl 2 % CREA Apply to bilateral feet BID 45 g 1  . nitroGLYCERIN (NITROSTAT) 0.4 MG SL tablet Place 1 tablet (0.4 mg total) under the tongue every 5 (five) minutes x 3 doses as needed for chest pain (up to 3 doses). 25 tablet 4  . potassium chloride SA (K-DUR,KLOR-CON) 20 MEQ tablet Take 1 tablet (20 mEq total) by mouth daily. 90 tablet 1  . PROVENTIL HFA 108 (90 BASE) MCG/ACT inhaler INHALE ONE TO TWO PUFFS BY MOUTH EVERY 6 HOURS AS NEEDED FOR WHEEZING OR FOR SHORTNESS OF BREATH 7 g 3  . traMADol (ULTRAM) 50 MG tablet Take 1 tablet (50 mg total) by mouth every 6 (six) hours as needed for moderate pain. 30 tablet 0   No current facility-administered medications for this visit.     Past Surgical History  Procedure Laterality Date  . 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 Lad stent    . Iliac artery stent Left 08/31/2013  . Cardiac catheterization      stents X 2  . Endarterectomy femoral Left 11/02/2013    Procedure: RESECTION LEFT COMMON FEMORAL ARTERY AND INSERTION OF INTERPOSITION 68mm HEMASHIELD GRAFT FROM LEFT EXTERNAL  ILIAC ARTERY TO LEFT PROFUNDA  FEMORIS ARTERY;  Surgeon: Mal Misty, MD;  Location: [place];  Service: Vascular;  Laterality: Left;  . Left heart catheterization with coronary angiogram N/A 04/23/2012    Procedure: LEFT HEART CATHETERIZATION WITH CORONARY ANGIOGRAM;  Surgeon: Peter M Martinique, MD;  Location[facility] CATH LAB;  Service: Cardiovascular;  Laterality: N/A;  . Coronary angiogram  04/27/2012    Procedure: CORONARY ANGIOGRAM;  Surgeon: Thayer Headings, MD;  Location: [facility] CATH LAB;  Service: Cardiovascular;;  . Abdominal aortagram N/A 07/21/2012    Procedure: ABDOMINAL Maxcine Ham;  Surgeon: Wellington Hampshire, MD;  Location: [place] CATH LAB;  Service: Cardiovascular;  Laterality: N/A;  . Lower extremity angiogram Left 08/31/2013    Procedure: LOWER  EXTREMITY ANGIOGRAM;  Surgeon: Wellington Hampshire, MD;  Location: [place] CATH LAB;  Service: Cardiovascular;  Laterality: Left;  . Percutaneous stent intervention Left 08/31/2013    Procedure: PERCUTANEOUS STENT INTERVENTION;  Surgeon: Wellington Hampshire, MD;  Location: [place] CATH LAB;  Service: Cardiovascular;  Laterality: Left;  Common Iliac artery  . Fracture surgery Right Jul 07, 2014    Right Foot-  Pt.fell  at Home  by Heat duct     Allergies  Allergen Reactions  . Tape Rash      Family History  Problem Relation Age of Onset  . Diabetes Mother   .  Hyperlipidemia Mother   . Hypertension Mother   . Peripheral vascular disease Mother      Social History Ms. Kitko reports that she quit smoking about 13 years ago. Her smoking use included Cigarettes. She started smoking about 50 years ago. She has a 36 pack-year smoking history. She quit smokeless tobacco use about 13 years ago. Ms. Groll reports that she does not drink alcohol.   Review of Systems CONSTITUTIONAL: No weight loss, fever, chills, weakness or fatigue.  HEENT: Eyes: No visual loss, blurred vision, double vision or yellow sclerae.No hearing loss, sneezing, congestion, runny nose or sore throat.  SKIN: No rash or itching.  CARDIOVASCULAR: per hpi RESPIRATORY: No shortness of breath, cough or sputum.  GASTROINTESTINAL: No anorexia, nausea, vomiting or diarrhea. No abdominal pain or blood.  GENITOURINARY: No burning on urination, no polyuria NEUROLOGICAL: No headache, dizziness, syncope, paralysis, ataxia, numbness or tingling in the extremities. No change in bowel or bladder control.  MUSCULOSKELETAL: No muscle, back pain, joint pain or stiffness.  LYMPHATICS: No enlarged nodes. No history of splenectomy.  PSYCHIATRIC: No history of depression or anxiety.  ENDOCRINOLOGIC: No reports of sweating, cold or heat intolerance. No polyuria or polydipsia.  Marland Kitchen   Physical Examination Filed Vitals:   12/15/14 1416  BP: 120/66   Pulse: 62   Filed Vitals:   12/15/14 1416  Height: 5\' 2"  (1.575 m)  Weight: 212 lb (96.163 kg)    Gen: resting comfortably, no acute distress HEENT: no scleral icterus, pupils equal round and reactive, no palptable cervical adenopathy,  CV: RRR, no m/r/g, no jv Resp: Clear to auscultation bilaterally GI: abdomen is soft, non-tender, non-distended, normal bowel sounds, no hepatosplenomegaly MSK: extremities are warm, no edema.  Skin: warm, no rash Neuro:  no focal deficits Psych: appropriate affect   Diagnostic Studies 04/2012 Echo: LVEF 55-60%, no WMA, mild LVH, grade I diastolic dysfunction  10/5636 ABI: Right 0.57 left 0.48. Moderate left iliac disease, distal right SFA occlusion with reconstitution at the popliteal.  04/2012 Carotid US: bilateral plaque without significant stenosis.  04/2012 Cardiac Cath  PROCEDURAL FINDINGS  Hemodynamics:  AO 155/58 mean 91 mm Hg  LV 163/13 mm Hg  Coronary angiography:  Coronary dominance: right  Left mainstem: Short with shared coronary ostia for the LAD and LCX.  Left anterior descending (LAD): diffuse 30-40% proximal. 90% focal lesion after the first diagonal.  Left circumflex (LCx): <20% proximal and mid. 80% very small distal OM.  Right coronary artery (RCA): Diffuse 20% proximal and mid vessel disease. Moderate calcification.  Left ventriculography: Left ventricular systolic function is normal, LVEF is estimated at 55-65%, there is no significant mitral regurgitation  PCI Note: Following the diagnostic procedure, the decision was made to proceed with PCI. The radial sheath was upsized to a 6 [place]. Bilinta 180 mg was given orally. Weight-based bivalirudin was given for anticoagulation. Once a therapeutic ACT was achieved, a 6 [place] XB 3.0 guide catheter was inserted. A prowater coronary guidewire was used to cross the lesion. The lesion was predilated with a 2.5 mm balloon. The lesion was then stented with a 3.0x 16 mm  Promus premier stent. The stent was postdilated with a 3.25 mm noncompliant balloon. Following PCI, there was 0% residual stenosis and TIMI-3 flow. Final angiography confirmed an excellent result. The patient tolerated the procedure well. There were no immediate procedural complications. A TR band was used for radial hemostasis. The patient was transferred to the post catheterization recovery area for further monitoring.  PCI Data:  Vessel - LAD/Segment - mid  Percent Stenosis (pre) 90%  TIMI-flow 3  Stent 3.0 x 16 mm Promus premier  Percent Stenosis (post) 0%  TIMI-flow (post) 3  Final Conclusions:  1. Single vessel obstructive CAD  2. Normal LV function.  3. Successful stenting of the mid LAD with a DES.    07/2012 Lower Extremity Angiography  Hemodynamics:  Central Aortic Pressure / Mean Aortic Pressure: 157/70  Findings:   Abdominal aorta: Normal in size with no aneurysm. There is mild atherosclerosis below the renal arteries. Distally above the iliac bifurcation there is a calcified plaque at the origin of the left external iliac causing 30% stenosis.   Left renal artery: Normal   Right renal artery: Normal   Celiac artery: Patent   Superior mesenteric artery: Patent   Right common iliac artery: Diffuse 20% disease.   Right internal iliac artery: Normal   Right external iliac artery: Normal   Right common femoral artery: Minor irregularities.   Right profunda femoral artery: Diffuse 20% disease proximally.   Right superficial femoral artery: Diffuse atherosclerosis throughout its course. There is 20% disease at the ostium. This is followed by 70% stenosis proximally. There is another 70% stenosis in the midsegment followed by an occlusion distally Reconstituting in the proximal popliteal artery. This segment is about 50 mm and heavily calcified.   Right popliteal artery: 20-30% disease proximally.   Right tibial peroneal trunk: Normal    Right anterior tibial artery: Diffusely diseased proximally and seems to be occluded in the midsegment.   Right peroneal artery: Patent but diffusely diseased.   Right posterior tibial artery: Patent and the dominant vessel below the knee. It has minor irregularities.   Left common iliac artery: There is a 70-80 % heavily calcified lesion proximally.   Left internal iliac artery: Diffusely diseased.   Left external iliac artery: Minor irregularities.   Left common femoral artery: Heavily calcified with diffuse 90% disease extending into the ostium of the SFA.   Left profunda femoral artery: Normal.   Left superficial femoral artery: 70% ostial disease with heavy calcifications. Diffuse 40% disease proximally. The vessel is occluded in the midsegment with reconstitution distally via collaterals from the profunda.   Left popliteal artery: Minor irregularities.   Left tibial peroneal trunk: Minor irregularities.   Left anterior tibial artery: Patent. This is the dominant vessel.   Left peroneal artery: Patent but diffusely diseased.   Left posterior tibial artery: Occlude proximally. Conclusions:  1. Significant heavily calcified ostial left common iliac artery stenosis with plaque extending into the distal aorta.  2. Severe heavily calcified disease in the left common femoral artery.  3. Bilateral SFA occlusion with heavy calcifications.  4. Significant below the knee disease bilaterally as outlined above.  Recommendations:  The patient has diffuse and heavily calcified peripheral arterial disease. Revascularization options are somewhat difficult by endovascular means. There is heavily calcified disease in the left common femoral artery which is not approachable by endovascular options. Endarterectomy is the best option for this. The stenosis in the left common iliac artery can be treated with stenting but will require likely stenting the distal aorta as well.  Both SFAs are diffusely diseased, calcified with chronic occlusion. We'll attempt medical therapy and a walking program for now.    11/2012 Labs: 140 K 4.2 Cl 97 Cr 0.75 BUN 9 AST 16 ALT 21 T.bili 0.2 TC 205 TG 169 HDL 70 LDL 101  01/2013 Event Monitor  No symptoms,  no arrythmias  06/2013 Lexiscan MPI  Large reversible defect in the basal anterior, basal anteroseptal, mid anterior, mid anteroseptal, apical anteior, apical segments mild to moderate.  07/21/13 Cath  Hemodynamic Measurements Pressures:  - AO 130 / 56. - LV systolic 381 LV End-diastolic 13.  Angiographic Findings: Left main: Normal Left anterior descending: There is 70% stenosis at the proximal segment, first diagonal is intermediate in size, there is a stent in mid LAD which is patent. Left circumflex: Angiographically normal Right coronary artery: There is 30% narrowing in the mid segment. Right dominant  Left ventriculogram: Not done.   INTERVENTIONAL PROCEDURE NARRATIVE:  Intra-operative Anticoagulation: Additional dose of 2500 heparin IV  Equipment - Guide Catheters: 6 [place] EBU 3 - Guide Wires: Run-through  Procedure Details: I advanced a 3.0 x 12 balloon and predilated the lesion up to 14 atmospheres for 20 seconds and then I repeated that for another 14 atmospheres for 20 seconds. Then I advanced a Promus 3.5 x 16 drug-eluting stent and deployed it at the ostium and  proximal LAD with extreme care not to involve the ostium of left circumflex. I deployed the stent at 16 atmospheres, for 45 seconds, enlarging the stent to 3.7 mm. Post deployment angiography showed excellent results no complications. I gave the patient  Effient 60 mg, removed the catheter and used a TR band to obtain hemostasis.Marland Kitchen  Post-operative Anticoagulation: Aspirin and Effient  Complications: None  IMPRESSION: 1. Successful PCI to proximal LAD with a Promus 3.5 x 16 drug-eluting stent, postdilated to 3.7 mm. 2. Patent mid LAD  stent. 3. Normal left ventricular end-diastolic pressure of 13 mm Hg. 4. Coronary angiography and percutaneous coronary intervention performed with access through the right radial artery.  PLAN: 1. Aspirin 81 mg and Effient 10 mg daily for at least one year 2. Continue medical management of coronary artery disease. 3. Due to the fact that patient has significant life style limiting Rutherford class III claudication, I will proceed with PVR and duplex study. 4. Followup with Dr. Hamilton Capri.      Assessment and Plan  1. CAD - no current symptoms, continue current medical therapy - we have elected to continue DAPT due to her strong history of CAD and PAD with multiple interventions  2. PAD - recent bypass by vascular surgery, recovering fairly well - continue to follow with Dr Fletcher [name] and vascular  3. Chronic diastolic HF - appears euvolemic in clinic, continue current meds  4. HTN -at goal, continue current meds  5. Hyperlipidemia - continue high dose statin    F/u 6 months  Arnoldo Lenis, M.D

## 2014-12-15 NOTE — Patient Instructions (Signed)
Continue all current medications. Your physician wants you to follow up in: 6 months.  You will receive a reminder letter in the mail one-two months in advance.  If you don't receive a letter, please call our office to schedule the follow up appointment   

## 2014-12-18 ENCOUNTER — Other Ambulatory Visit: Payer: Self-pay | Admitting: Family

## 2014-12-22 ENCOUNTER — Other Ambulatory Visit: Payer: Self-pay | Admitting: Family

## 2014-12-22 ENCOUNTER — Other Ambulatory Visit: Payer: Self-pay | Admitting: Cardiovascular Disease

## 2014-12-25 ENCOUNTER — Telehealth: Payer: Self-pay | Admitting: Family

## 2014-12-25 MED ORDER — INSULIN ASPART PROT & ASPART (70-30 MIX) 100 UNIT/ML PEN: PEN_INJECTOR | SUBCUTANEOUS | Status: DC

## 2014-12-25 NOTE — Telephone Encounter (Signed)
Prescription sent to pharmacy.

## 2014-12-28 ENCOUNTER — Telehealth: Payer: Self-pay | Admitting: Family

## 2014-12-28 NOTE — Telephone Encounter (Signed)
Reviewed x ray with pt. Pt voiced understanding.

## 2015-01-16 ENCOUNTER — Other Ambulatory Visit: Payer: Self-pay | Admitting: Family

## 2015-02-13 ENCOUNTER — Other Ambulatory Visit: Payer: Self-pay | Admitting: Family

## 2015-02-18 HISTORY — PX: EYE SURGERY: SHX253

## 2015-02-27 ENCOUNTER — Ambulatory Visit: Payer: 59 | Admitting: Family

## 2015-03-01 ENCOUNTER — Telehealth: Payer: Self-pay | Admitting: *Deleted

## 2015-03-01 ENCOUNTER — Emergency Department (HOSPITAL_COMMUNITY): Payer: 59

## 2015-03-01 ENCOUNTER — Emergency Department (HOSPITAL_COMMUNITY)
Admission: EM | Admit: 2015-03-01 | Discharge: 2015-03-01 | Disposition: A | Discharge status: Home / Self Care | Payer: 59 | Attending: Emergency Medicine | Admitting: Emergency Medicine

## 2015-03-01 ENCOUNTER — Encounter (HOSPITAL_COMMUNITY): Payer: Self-pay | Admitting: *Deleted

## 2015-03-01 DIAGNOSIS — Z7982 Long term (current) use of aspirin: Secondary | ICD-10-CM | POA: Insufficient documentation

## 2015-03-01 DIAGNOSIS — Z79899 Other long term (current) drug therapy: Secondary | ICD-10-CM | POA: Insufficient documentation

## 2015-03-01 DIAGNOSIS — Z9889 Other specified postprocedural states: Secondary | ICD-10-CM | POA: Diagnosis not present

## 2015-03-01 DIAGNOSIS — Z87891 Personal history of nicotine dependence: Secondary | ICD-10-CM | POA: Insufficient documentation

## 2015-03-01 DIAGNOSIS — I2510 Atherosclerotic heart disease of native coronary artery without angina pectoris: Secondary | ICD-10-CM | POA: Insufficient documentation

## 2015-03-01 DIAGNOSIS — E119 Type 2 diabetes mellitus without complications: Secondary | ICD-10-CM | POA: Diagnosis not present

## 2015-03-01 DIAGNOSIS — Z794 Long term (current) use of insulin: Secondary | ICD-10-CM | POA: Diagnosis not present

## 2015-03-01 DIAGNOSIS — I5032 Chronic diastolic (congestive) heart failure: Secondary | ICD-10-CM | POA: Diagnosis not present

## 2015-03-01 DIAGNOSIS — R200 Anesthesia of skin: Secondary | ICD-10-CM | POA: Diagnosis present

## 2015-03-01 DIAGNOSIS — Z7901 Long term (current) use of anticoagulants: Secondary | ICD-10-CM | POA: Insufficient documentation

## 2015-03-01 DIAGNOSIS — I10 Essential (primary) hypertension: Secondary | ICD-10-CM | POA: Diagnosis not present

## 2015-03-01 DIAGNOSIS — R079 Chest pain, unspecified: Secondary | ICD-10-CM | POA: Diagnosis not present

## 2015-03-01 DIAGNOSIS — R0789 Other chest pain: Secondary | ICD-10-CM | POA: Diagnosis not present

## 2015-03-01 DIAGNOSIS — R202 Paresthesia of skin: Secondary | ICD-10-CM | POA: Diagnosis not present

## 2015-03-01 DIAGNOSIS — Z862 Personal history of diseases of the blood and blood-forming organs and certain disorders involving the immune mechanism: Secondary | ICD-10-CM | POA: Insufficient documentation

## 2015-03-01 DIAGNOSIS — Z9861 Coronary angioplasty status: Secondary | ICD-10-CM | POA: Insufficient documentation

## 2015-03-01 LAB — CBC WITH DIFFERENTIAL/PLATELET
Basophils Absolute: 0 10*3/uL (ref 0.0–0.1)
Basophils Relative: 0 %
EOS ABS: 0.2 10*3/uL (ref 0.0–0.7)
Eosinophils Relative: 1 %
HEMATOCRIT: 48.7 % — AB (ref 36.0–46.0)
HEMOGLOBIN: 16.1 g/dL — AB (ref 12.0–15.0)
LYMPHS ABS: 3.4 10*3/uL (ref 0.7–4.0)
LYMPHS PCT: 26 %
MCH: 30.7 pg (ref 26.0–34.0)
MCHC: 33.1 g/dL (ref 30.0–36.0)
MCV: 92.8 fL (ref 78.0–100.0)
MONOS PCT: 5 %
Monocytes Absolute: 0.7 10*3/uL (ref 0.1–1.0)
NEUTROS ABS: 8.9 10*3/uL — AB (ref 1.7–7.7)
NEUTROS PCT: 68 %
Platelets: 282 10*3/uL (ref 150–400)
RBC: 5.25 MIL/uL — AB (ref 3.87–5.11)
RDW: 14.9 % (ref 11.5–15.5)
WBC: 13.2 10*3/uL — AB (ref 4.0–10.5)

## 2015-03-01 LAB — BASIC METABOLIC PANEL
Anion gap: 12 (ref 5–15)
BUN: 16 mg/dL (ref 6–20)
CHLORIDE: 98 mmol/L — AB (ref 101–111)
CO2: 29 mmol/L (ref 22–32)
Calcium: 10.4 mg/dL — ABNORMAL HIGH (ref 8.9–10.3)
Creatinine, Ser: 0.78 mg/dL (ref 0.44–1.00)
GFR calc non Af Amer: >60 mL/min (ref >60)
Glucose, Bld: 117 mg/dL — ABNORMAL HIGH (ref 65–99)
POTASSIUM: 4.7 mmol/L (ref 3.5–5.1)
SODIUM: 139 mmol/L (ref 135–145)

## 2015-03-01 LAB — I-STAT TROPONIN, ED: Troponin i, poc: 0 ng/mL (ref 0.00–0.08)

## 2015-03-01 MED ORDER — ASPIRIN 81 MG PO CHEW: 324.0000 mg | CHEWABLE_TABLET | Freq: Once | ORAL | Status: DC

## 2015-03-01 NOTE — ED Notes (Signed)
Patient transported to CT 

## 2015-03-01 NOTE — ED Notes (Signed)
Patient transported to X-ray in nad.

## 2015-03-01 NOTE — Discharge Instructions (Signed)
Nonspecific Chest Pain  Chest pain can be caused by many different conditions. There is always a chance that your pain could be related to something serious, such as a heart attack or a blood clot in your lungs. Chest pain can also be caused by conditions that are not life-threatening. If you have chest pain, it is very important to follow up with your health care provider. CAUSES  Chest pain can be caused by:  Heartburn.  Pneumonia or bronchitis.  Anxiety or stress.  Inflammation around your heart (pericarditis) or lung (pleuritis or pleurisy).  A blood clot in your lung.  A collapsed lung (pneumothorax). It can develop suddenly on its own (spontaneous pneumothorax) or from trauma to the chest.  Shingles infection (varicella-zoster virus).  Heart attack.  Damage to the bones, muscles, and cartilage that make up your chest wall. This can include:  Bruised bones due to injury.  Strained muscles or cartilage due to frequent or repeated coughing or overwork.  Fracture to one or more ribs.  Sore cartilage due to inflammation (costochondritis). RISK FACTORS  Risk factors for chest pain may include:  Activities that increase your risk for trauma or injury to your chest.  Respiratory infections or conditions that cause frequent coughing.  Medical conditions or overeating that can cause heartburn.  Heart disease or family history of heart disease.  Conditions or health behaviors that increase your risk of developing a blood clot.  Having had chicken pox (varicella zoster). SIGNS AND SYMPTOMS Chest pain can feel like:  Burning or tingling on the surface of your chest or deep in your chest.  Crushing, pressure, aching, or squeezing pain.  Dull or sharp pain that is worse when you move, cough, or take a deep breath.  Pain that is also felt in your back, neck, shoulder, or arm, or pain that spreads to any of these areas. Your chest pain may come and go, or it may stay  constant. DIAGNOSIS Lab tests or other studies may be needed to find the cause of your pain. Your health care provider may have you take a test called an ambulatory ECG (electrocardiogram). An ECG records your heartbeat patterns at the time the test is performed. You may also have other tests, such as:  Transthoracic echocardiogram (TTE). During echocardiography, sound waves are used to create a picture of all of the heart structures and to look at how blood flows through your heart.  Transesophageal echocardiogram (TEE).This is a more advanced imaging test that obtains images from inside your body. It allows your health care provider to see your heart in finer detail.  Cardiac monitoring. This allows your health care provider to monitor your heart rate and rhythm in real time.  Holter monitor. This is a portable device that records your heartbeat and can help to diagnose abnormal heartbeats. It allows your health care provider to track your heart activity for several days, if needed.  Stress tests. These can be done through exercise or by taking medicine that makes your heart beat more quickly.  Blood tests.  Imaging tests. TREATMENT  Your treatment depends on what is causing your chest pain. Treatment may include:  Medicines. These may include:  Acid blockers for heartburn.  Anti-inflammatory medicine.  Pain medicine for inflammatory conditions.  Antibiotic medicine, if an infection is present.  Medicines to dissolve blood clots.  Medicines to treat coronary artery disease.  Supportive care for conditions that do not require medicines. This may include:  Resting.  Applying heat  or cold packs to injured areas.  Limiting activities until pain decreases. HOME CARE INSTRUCTIONS  If you were prescribed an antibiotic medicine, finish it all even if you start to feel better.  Avoid any activities that bring on chest pain.  Do not use any tobacco products, including  cigarettes, chewing tobacco, or electronic cigarettes. If you need help quitting, ask your health care provider.  Do not drink alcohol.  Take medicines only as directed by your health care provider.  Keep all follow-up visits as directed by your health care provider. This is important. This includes any further testing if your chest pain does not go away.  If heartburn is the cause for your chest pain, you may be told to keep your head raised (elevated) while sleeping. This reduces the chance that acid will go from your stomach into your esophagus.  Make lifestyle changes as directed by your health care provider. These may include:  Getting regular exercise. Ask your health care provider to suggest some activities that are safe for you.  Eating a heart-healthy diet. A registered dietitian can help you to learn healthy eating options.  Maintaining a healthy weight.  Managing diabetes, if necessary.  Reducing stress. SEEK MEDICAL CARE IF:  Your chest pain does not go away after treatment.  You have a rash with blisters on your chest.  You have a fever. SEEK IMMEDIATE MEDICAL CARE IF:   Your chest pain is worse.  You have an increasing cough, or you cough up blood.  You have severe abdominal pain.  You have severe weakness.  You faint.  You have chills.  You have sudden, unexplained chest discomfort.  You have sudden, unexplained discomfort in your arms, back, neck, or jaw.  You have shortness of breath at any time.  You suddenly start to sweat, or your skin gets clammy.  You feel nauseous or you vomit.  You suddenly feel light-headed or dizzy.  Your heart begins to beat quickly, or it feels like it is skipping beats. These symptoms may represent a serious problem that is an emergency. Do not wait to see if the symptoms will go away. Get medical help right away. Call your local emergency services (911 in the [place] Do not drive yourself to the hospital.   This  information is not intended to replace advice given to you by your health care provider. Make sure you discuss any questions you have with your health care provider.   Document Released: 11/13/2004 Document Revised: 02/24/2014 Document Reviewed: 09/09/2013 Elsevier Interactive Patient Education 2016 [employer] Radicular Pain Radicular pain in either the arm or leg is usually from a bulging or herniated disk in the spine. A piece of the herniated disk may press against the nerves as the nerves exit the spine. This causes pain which is felt at the tips of the nerves down the arm or leg. Other causes of radicular pain may include:  Fractures.  Heart disease.  Cancer.  An abnormal and usually degenerative state of the nervous system or nerves (neuropathy). Diagnosis may require CT or MRI scanning to determine the primary cause.  Nerves that start at the neck (nerve roots) may cause radicular pain in the outer shoulder and arm. It can spread down to the thumb and fingers. The symptoms vary depending on which nerve root has been affected. In most cases radicular pain improves with conservative treatment. Neck problems may require physical therapy, a neck collar, or cervical traction. Treatment may take many weeks,  and surgery may be considered if the symptoms do not improve.  Conservative treatment is also recommended for sciatica. Sciatica causes pain to radiate from the lower back or buttock area down the leg into the foot. Often there is a history of back problems. Most patients with sciatica are better after 2 to 4 weeks of rest and other supportive care. Short term bed rest can reduce the disk pressure considerably. Sitting, however, is not a good position since this increases the pressure on the disk. You should avoid bending, lifting, and all other activities which make the problem worse. Traction can be used in severe cases. Surgery is usually reserved for patients who do not improve within the  first months of treatment. Only take over-the-counter or prescription medicines for pain, discomfort, or fever as directed by your caregiver. Narcotics and muscle relaxants may help by relieving more severe pain and spasm and by providing mild sedation. Cold or massage can give significant relief. Spinal manipulation is not recommended. It can increase the degree of disc protrusion. Epidural steroid injections are often effective treatment for radicular pain. These injections deliver medicine to the spinal nerve in the space between the protective covering of the spinal cord and back bones (vertebrae). Your caregiver can give you more information about steroid injections. These injections are most effective when given within two weeks of the onset of pain.  You should see your caregiver for follow up care as recommended. A program for neck and back injury rehabilitation with stretching and strengthening exercises is an important part of management.  SEEK IMMEDIATE MEDICAL CARE IF:  You develop increased pain, weakness, or numbness in your arm or leg.  You develop difficulty with bladder or bowel control.  You develop abdominal pain.   This information is not intended to replace advice given to you by your health care provider. Make sure you discuss any questions you have with your health care provider.   Document Released: 03/13/2004 Document Revised: 02/24/2014 Document Reviewed: 08/30/2014 Elsevier Interactive Patient Education [employer].

## 2015-03-01 NOTE — Telephone Encounter (Signed)
Pt says having pain in L arm/neck/back with numbness off and on since yesterday with some dizziness denies CP and DOB, says pain lasting today for 3 hours. Pt is alone and doesn't have anyone to take her to ED. Pt has called EMS and requesting to go to [facility] ED.

## 2015-03-01 NOTE — ED Notes (Signed)
Pt arrives from home via [facility] c/o left arm numbness, headache and some dizziness that started last night.

## 2015-03-01 NOTE — ED Provider Notes (Signed)
CSN: [id number]     Arrival date & time 03/01/15  1427 History   First MD Initiated Contact with Patient 03/01/15 1440     Chief Complaint  Patient presents with  . Numbness   Leah Ferrell is a 65 y.o. female with a history of hypertension, diabetes, coronary artery disease, peripheral vascular disease, and obesity who presents to the emergency department by EMS complaining of left arm numbness/tingling since last night. Patient reports she was standing at her kitchen sink when she had sudden onset of sharp left neck pain that developed into left upper arm tingling. She reports her left neck pain has since resolved. She reports yesterday she had some fleeting chest pain lasting seconds. She reports she is currently chest pain-free. She reports she had some intermittent dizziness and blurry vision last night that has since resolved. She denies any headache or neck pain currently.  Her only current complaint is some left upper arm tingling.  The patient denies fevers, coughing, shortness of breath, headache, double vision, current neck pain, abdominal pain, nausea, vomiting, coughing, wheezing, urinary symptoms, or rashes. She denies trauma or injury to her neck.   (Consider location/radiation/quality/duration/timing/severity/associated sxs/prior Treatment) HPI  Past Medical History  Diagnosis Date  . Hypertension   . Diabetes mellitus without complication ([place])   . Hyperlipidemia   . CAD (coronary artery disease)     a. 04/2012 NSTEMI: s/p DES to LAD.  Marland Kitchen PVD (peripheral vascular disease) ([place])     a. 04/2012 ABI: R 0.49, L 0.39. Angiography 06/14: Significant ostial left common iliac artery stenosis extending into the distal aorta, severe left common femoral artery stenosis, bilateral SFA occlusion with heavy calcifications. Functionally, one-vessel runoff bilaterally below the knee  . Chronic diastolic CHF (congestive heart failure) ([place])   . Leukocytosis   . Morbid obesity ([place])   . NSTEMI  (non-ST elevated myocardial infarction) ([place]) 04/27/2012  . Dysrhythmia   . Shortness of breath   . PONV (postoperative nausea and vomiting)   . Environmental and seasonal allergies    Past Surgical History  Procedure Laterality Date  . 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 Lad stent    . Iliac artery stent Left 08/31/2013  . Cardiac catheterization      stents X 2  . Endarterectomy femoral Left 11/02/2013    Procedure: RESECTION LEFT COMMON FEMORAL ARTERY AND INSERTION OF INTERPOSITION 18mm HEMASHIELD GRAFT FROM LEFT EXTERNAL  ILIAC ARTERY TO LEFT PROFUNDA  FEMORIS ARTERY;  Surgeon: Mal Misty, MD;  Location: [facility];  Service: Vascular;  Laterality: Left;  . Left heart catheterization with coronary angiogram N/A 04/23/2012    Procedure: LEFT HEART CATHETERIZATION WITH CORONARY ANGIOGRAM;  Surgeon: Peter M Martinique, MD;  Location: [facility] CATH LAB;  Service: Cardiovascular;  Laterality: N/A;  . Coronary angiogram  04/27/2012    Procedure: CORONARY ANGIOGRAM;  Surgeon: Thayer Headings, MD;  Location: [facility] CATH LAB;  Service: Cardiovascular;;  . Abdominal aortagram N/A 07/21/2012    Procedure: ABDOMINAL Maxcine Ham;  Surgeon: Wellington Hampshire, MD;  Location: [facility] CATH LAB;  Service: Cardiovascular;  Laterality: N/A;  . Lower extremity angiogram Left 08/31/2013    Procedure: LOWER EXTREMITY ANGIOGRAM;  Surgeon: Wellington Hampshire, MD;  Location: [place] CATH LAB;  Service: Cardiovascular;  Laterality: Left;  . Percutaneous stent intervention Left 08/31/2013    Procedure: PERCUTANEOUS STENT INTERVENTION;  Surgeon: Wellington Hampshire, MD;  Location: [place] CATH LAB;  Service: Cardiovascular;  Laterality:  Left;  Common Iliac artery  . Fracture surgery Right Jul 07, 2014    Right Foot-  Pt.fell  at Home  by Heat duct   Family History  Problem Relation Age of Onset  . Diabetes Mother   . Hyperlipidemia Mother   . Hypertension Mother   . Peripheral vascular disease Mother    Social History  Substance Use Topics   . Smoking status: Former Smoker -- 1.00 packs/day for 36 years    Types: Cigarettes    Start date: 04/21/1964    Quit date: 02/17/2001  . Smokeless tobacco: Former Systems developer    Quit date: 04/20/2001  . Alcohol Use: No   OB History    No data available     Review of Systems  Constitutional: Negative for fever and chills.  HENT: Negative for congestion and sore throat.   Eyes: Negative for visual disturbance.  Respiratory: Negative for cough, chest tightness, shortness of breath and wheezing.   Cardiovascular: Positive for chest pain. Negative for palpitations and leg swelling.  Gastrointestinal: Negative for nausea, vomiting, abdominal pain and diarrhea.  Genitourinary: Negative for dysuria and difficulty urinating.  Musculoskeletal: Positive for neck pain. Negative for myalgias, back pain and neck stiffness.  Skin: Negative for rash.  Neurological: Positive for dizziness (resolved. ), numbness and headaches (resolved). Negative for seizures, syncope, speech difficulty and weakness.      Allergies  Tape  Home Medications   Prior to Admission medications   Medication Sig Start Date End Date Taking? Authorizing Provider  amLODipine (NORVASC) 10 MG tablet TAKE ONE TABLET BY MOUTH ONCE DAILY 02/13/15  Yes Sharion Balloon, FNP  Ascorbic Acid (VITAMIN C) 500 MG CAPS Take 1 capsule by mouth daily.    Yes Historical Provider, MD  aspirin EC 81 MG tablet Take 81 mg by mouth daily.    Yes Historical Provider, MD  Calcium Carbonate-Vit D-Min (CALTRATE 600+D PLUS MINERALS) 600-800 MG-UNIT TABS Take 1 tablet by mouth 2 (two) times daily.   Yes Historical Provider, MD  canagliflozin (INVOKANA) 300 MG TABS tablet Take 300 mg by mouth daily before breakfast. 08/08/14  Yes Sharion Balloon, FNP  clopidogrel (PLAVIX) 75 MG tablet TAKE ONE TABLET BY MOUTH ONCE DAILY 12/22/14  Yes Wellington Hampshire, MD  conjugated estrogens (PREMARIN) vaginal cream Place 1 Applicatorful vaginally daily. 05/08/14  Yes  Sharion Balloon, FNP  CRESTOR 20 MG tablet TAKE ONE TABLET BY MOUTH ONCE DAILY 01/16/15  Yes Sharion Balloon, FNP  fish oil-omega-3 fatty acids 1000 MG capsule Take 1 g by mouth every morning.   Yes Historical Provider, MD  furosemide (LASIX) 40 MG tablet TAKE ONE TABLET BY MOUTH ONCE DAILY 12/25/14  Yes Sharion Balloon, FNP  hydroxypropyl methylcellulose (ISOPTO TEARS) 2.5 % ophthalmic solution Place 1 drop into both eyes 3 (three) times daily as needed (for dry eyes).   Yes Historical Provider, MD  Hypromell-Glycerin-Naphazoline 0.8-0.25-0.012 % SOLN Apply 1 application to eye daily as needed (dry eyes).    Yes Historical Provider, MD  insulin aspart protamine - aspart (NOVOLOG MIX 70/30 FLEXPEN) (70-30) 100 UNIT/ML FlexPen Use 70 unit TID with meals Patient taking differently: Use 75 unit TID with meals 04/05/14  Yes Sharion Balloon, FNP  lisinopril (PRINIVIL,ZESTRIL) 20 MG tablet TAKE ONE TABLET BY MOUTH TWICE DAILY 12/08/14  Yes Sharion Balloon, FNP  metoprolol succinate (TOPROL-XL) 50 MG 24 hr tablet TAKE ONE & ONE-HALF TABLETS BY MOUTH TWICE DAILY 11/21/14  Yes Christy A  Hawks, FNP  montelukast (SINGULAIR) 10 MG tablet TAKE ONE TABLET BY MOUTH ONCE DAILY AT BEDTIME 12/18/14  Yes Sharion Balloon, FNP  Multiple Vitamin (MULTIVITAMIN WITH MINERALS) TABS Take 1 tablet by mouth daily.   Yes Historical Provider, MD  potassium chloride SA (K-DUR,KLOR-CON) 20 MEQ tablet Take 1 tablet (20 mEq total) by mouth daily. 11/23/14  Yes Sharion Balloon, FNP  PROVENTIL HFA 108 (90 BASE) MCG/ACT inhaler INHALE ONE TO TWO PUFFS BY MOUTH EVERY 6 HOURS AS NEEDED FOR WHEEZING OR FOR SHORTNESS OF BREATH 06/15/14  Yes Sharion Balloon, FNP  insulin aspart protamine - aspart (NOVOLOG MIX 70/30 FLEXPEN) (70-30) 100 UNIT/ML FlexPen INJECT 98 UNITS SUBCUTANEOUSLY THREE TIMES DAILY WITH MEALS Patient not taking: Reported on 03/01/2015 12/25/14   Sharion Balloon, FNP  Naftifine HCl 2 % CREA Apply to bilateral feet BID Patient not  taking: Reported on 03/01/2015 11/23/14   Sharion Balloon, FNP  nitroGLYCERIN (NITROSTAT) 0.4 MG SL tablet Place 1 tablet (0.4 mg total) under the tongue every 5 (five) minutes x 3 doses as needed for chest pain (up to 3 doses). 06/21/14   Herminio Commons, MD  RELION PEN NEEDLES 29G X 12MM MISC USE ONE NEEDELE TO INJECT INSULIN SUBCUTANEOUSLY THREE TIMES DAILY 12/25/14   Sharion Balloon, FNP  traMADol (ULTRAM) 50 MG tablet Take 1 tablet (50 mg total) by mouth every 6 (six) hours as needed for moderate pain. Patient not taking: Reported on 03/01/2015 11/04/13   Joelene Millin A Trinh, PA-C   BP 144/50 mmHg  Pulse 71  Temp(Src) 99.2 F (37.3 C) (Oral)  Resp 15  Ht 5\' 2"  (1.575 m)  Wt 95.255 kg  BMI 38.40 kg/m2  SpO2 95%  LMP  (Approximate) Physical Exam  Constitutional: She is oriented to person, place, and time. She appears well-developed and well-nourished. No distress.   Nontoxic appearing.  HENT:  Head: Normocephalic and atraumatic.  Right Ear: External ear normal.  Left Ear: External ear normal.  Mouth/Throat: Oropharynx is clear and moist.  Eyes: Conjunctivae and EOM are normal. Pupils are equal, round, and reactive to light. Right eye exhibits no discharge. Left eye exhibits no discharge.  Neck: Normal range of motion. Neck supple. No JVD present. No tracheal deviation present.   Good range of motion of her neck. No midline neck tenderness. No bruits noted.  Cardiovascular: Normal rate, regular rhythm, normal heart sounds and intact distal pulses.  Exam reveals no gallop and no friction rub.   No murmur heard.  Bilateral radial and posterior tibialis pulses are intact. Good capillary refill to her distal fingertips and toes.  Pulmonary/Chest: Effort normal and breath sounds normal. No respiratory distress. She has no wheezes. She has no rales. She exhibits no tenderness.   Lungs are clear to auscultation bilaterally.  Abdominal: Soft. Bowel sounds are normal. She exhibits no distension.  There is no tenderness. There is no guarding.  Musculoskeletal: Normal range of motion. She exhibits no edema or tenderness.   No lower extremities edema or tenderness. Patient has 5 out of 5 strength in her bilateral upper and lower extremities. Good and equal grip strength bilaterally.  Lymphadenopathy:    She has no cervical adenopathy.  Neurological: She is alert and oriented to person, place, and time. No cranial nerve deficit. Coordination normal.   The patient is alert and oriented 3. Cranial nerves are intact. No pronator drift. Finger to nose intact bilaterally. Speech is clear and coherent. Sensation is intact to  her bilateral upper and lower extremities.  Skin: Skin is warm and dry. No rash noted. She is not diaphoretic. No erythema. No pallor.  Psychiatric: She has a normal mood and affect. Her behavior is normal.  Nursing note and vitals reviewed.   ED Course  Procedures (including critical care time) Labs Review Labs Reviewed  BASIC METABOLIC PANEL - Abnormal; Notable for the following:    Chloride 98 (*)    Glucose, Bld 117 (*)    Calcium 10.4 (*)    All other components within normal limits  CBC WITH DIFFERENTIAL/PLATELET - Abnormal; Notable for the following:    WBC 13.2 (*)    RBC 5.25 (*)    Hemoglobin 16.1 (*)    HCT 48.7 (*)    Neutro Abs 8.9 (*)    All other components within normal limits  I-STAT TROPOININ, ED  Randolm Idol, ED    Imaging Review Dg Chest 2 View  03/01/2015  CLINICAL DATA:  Intermittent left chest pain, shortness of breath EXAM: CHEST  2 VIEW COMPARISON:  10/27/2013 FINDINGS: The heart size and mediastinal contours are within normal limits. Both lungs are clear. The visualized skeletal structures are unremarkable. IMPRESSION: No active cardiopulmonary disease. Electronically Signed   By: Kathreen Devoid   On: 03/01/2015 16:13   Ct Head Wo Contrast  03/01/2015  CLINICAL DATA:  Left arm and neck numbness, initial encounter EXAM: CT HEAD  WITHOUT CONTRAST TECHNIQUE: Contiguous axial images were obtained from the base of the skull through the vertex without intravenous contrast. COMPARISON:  None. FINDINGS: The bony calvarium is intact. The ventricles are of normal size and configuration. No findings to suggest acute hemorrhage, acute infarction or space-occupying mass lesion are noted. IMPRESSION: No acute intracranial abnormality noted. Electronically Signed   By: Inez Catalina M.D.   On: 03/01/2015 16:44   I have personally reviewed and evaluated these images and lab results as part of my medical decision-making.   EKG Interpretation   Date/Time:  Thursday March 01 2015 15:11:52 EST Ventricular Rate:  62 PR Interval:  168 QRS Duration: 85 QT Interval:  428 QTC Calculation: 435 R Axis:   23 Text Interpretation:  Sinus rhythm Probable left atrial enlargement  Confirmed by YELVERTON  MD, DAVID ([id number]) on 03/01/2015 3:15:18 PM      Filed Vitals:   03/01/15 1830 03/01/15 1845 03/01/15 1900 03/01/15 1915  BP: 135/48 139/48 134/54 144/50  Pulse: 70 68 68 71  Temp:      TempSrc:      Resp: 17 22 13 15   Height:      Weight:      SpO2: 96% 98% 96% 95%     MDM   Meds given in ED:  Medications - No data to display  New Prescriptions   No medications on file    Final diagnoses:  Paresthesia of arm  Intermittent chest pain   This is a 65 y.o. female with a history of hypertension, diabetes, coronary artery disease, peripheral vascular disease, and obesity who presents to the emergency department by EMS complaining of left arm numbness/tingling since last night. Patient reports she was standing at her kitchen sink when she had sudden onset of sharp left neck pain that developed into left upper arm tingling. She reports her left neck pain has since resolved. She reports yesterday she had some fleeting chest pain lasting seconds. She reports she is currently chest pain-free. She reports she had some intermittent dizziness  and blurry vision  last night that has since resolved. She denies any headache or neck pain currently.  Her only current complaint is some left upper arm tingling.   On exam the patient is afebrile nontoxic appearing. She has no focal deficits. Her lungs are clear to auscultation bilaterally. No midline neck or back tenderness. Good range of motion of her neck without difficulty. Patient's troponin is negative. BMP is unremarkable. CBC is remarkable for a leukocytosis of 13,000.CT head is unremarkable. Chest x-ray is unremarkable. EKG shows sinus rhythm with probable left atrial enlargement. After consultation with my attending, Dr. Lita Mains, will obtain delta troponin. Plan is for likely discharge if Delta trop is negative.  Dr. Troponin is negative. Patient had reevaluation reports that her tingling in his emesis resolved. She has no complaints at discharge. We'll discharge with follow-up with her primary care doctor and cardiology. I question radicular pain. I advised the patient to follow-up with their primary care provider this week. I advised the patient to return to the emergency department with new or worsening symptoms or new concerns. The patient verbalized understanding and agreement with plan.    This patient was discussed with Dr. Lita Mains who agrees with assessment and plan.   Waynetta Pean, PA-C 03/01/15 2021  Julianne Rice, MD 03/02/15 [phone]

## 2015-03-02 ENCOUNTER — Ambulatory Visit: Payer: 59 | Admitting: Family

## 2015-03-02 LAB — I-STAT TROPONIN, ED: TROPONIN I, POC: 0.01 ng/mL (ref 0.00–0.08)

## 2015-03-15 ENCOUNTER — Ambulatory Visit (INDEPENDENT_AMBULATORY_CARE_PROVIDER_SITE_OTHER): Payer: 59 | Admitting: Family

## 2015-03-15 ENCOUNTER — Encounter: Payer: Self-pay | Admitting: Family

## 2015-03-15 VITALS — BP 132/61 | HR 64 | Temp 97.1°F | Ht 62.0 in | Wt 210.6 lb

## 2015-03-15 DIAGNOSIS — N952 Postmenopausal atrophic vaginitis: Secondary | ICD-10-CM

## 2015-03-15 DIAGNOSIS — E875 Hyperkalemia: Secondary | ICD-10-CM | POA: Diagnosis not present

## 2015-03-15 DIAGNOSIS — I2510 Atherosclerotic heart disease of native coronary artery without angina pectoris: Secondary | ICD-10-CM

## 2015-03-15 DIAGNOSIS — I739 Peripheral vascular disease, unspecified: Secondary | ICD-10-CM | POA: Diagnosis not present

## 2015-03-15 DIAGNOSIS — E785 Hyperlipidemia, unspecified: Secondary | ICD-10-CM

## 2015-03-15 DIAGNOSIS — E1151 Type 2 diabetes mellitus with diabetic peripheral angiopathy without gangrene: Secondary | ICD-10-CM | POA: Diagnosis not present

## 2015-03-15 DIAGNOSIS — I214 Non-ST elevation (NSTEMI) myocardial infarction: Secondary | ICD-10-CM | POA: Diagnosis not present

## 2015-03-15 DIAGNOSIS — I5032 Chronic diastolic (congestive) heart failure: Secondary | ICD-10-CM | POA: Diagnosis not present

## 2015-03-15 LAB — POCT GLYCOSYLATED HEMOGLOBIN (HGB A1C): Hemoglobin A1C: 8.1

## 2015-03-15 MED ORDER — INSULIN ASPART PROT & ASPART (70-30 MIX) 100 UNIT/ML PEN: PEN_INJECTOR | SUBCUTANEOUS | Status: DC

## 2015-03-15 NOTE — Patient Instructions (Signed)

## 2015-03-15 NOTE — Progress Notes (Signed)
Subjective:    Patient ID: Leah Ferrell, female    DOB: 07-14-1950, 65 y.o.   MRN: [id number]  Pt presents to the office today for chronic follow up.  Diabetes She presents for her follow-up diabetic visit. She has type 2 diabetes mellitus. Her disease course has been stable. There are no hypoglycemic associated symptoms. Pertinent negatives for hypoglycemia include no headaches. Associated symptoms include fatigue and foot paresthesias. Pertinent negatives for diabetes include no blurred vision, no foot ulcerations and no visual change. There are no hypoglycemic complications. Diabetic complications include heart disease and peripheral neuropathy. Pertinent negatives for diabetic complications include no CVA or nephropathy. Risk factors for coronary artery disease include dyslipidemia, diabetes mellitus, hypertension, obesity and post-menopausal. Current diabetic treatment includes insulin injections and oral agent (dual therapy). She is compliant with treatment all of the time. Her weight is stable. She is following a generally healthy diet. Her breakfast blood glucose range is generally 110-130 mg/dl. An ACE inhibitor/angiotensin II receptor blocker is being taken. She sees a podiatrist.Eye exam is current.  Hypertension This is a chronic problem. The current episode started more than 1 year ago. The problem has been resolved since onset. The problem is controlled. Associated symptoms include palpitations. Pertinent negatives include no blurred vision, headaches, peripheral edema or shortness of breath. Risk factors for coronary artery disease include diabetes mellitus, dyslipidemia, family history, post-menopausal state and obesity. Past treatments include beta blockers, calcium channel blockers and ACE inhibitors. The current treatment provides moderate improvement. Hypertensive end-organ damage includes CAD/MI. There is no history of CVA, heart failure or a thyroid problem.  Hyperlipidemia This  is a chronic problem. The current episode started more than 1 year ago. The problem is controlled. Recent lipid tests were reviewed and are normal. Exacerbating diseases include diabetes and obesity. She has no history of hypothyroidism. Factors aggravating her hyperlipidemia include beta blockers. Associated symptoms include leg pain. Pertinent negatives include no shortness of breath. Current antihyperlipidemic treatment includes statins. The current treatment provides moderate improvement of lipids. Compliance problems include adherence to diet and medication cost.  Risk factors for coronary artery disease include diabetes mellitus, dyslipidemia, family history, hypertension, obesity and post-menopausal.      Review of Systems  Constitutional: Positive for fatigue.  HENT: Negative.   Eyes: Negative.  Negative for blurred vision.  Respiratory: Negative.  Negative for shortness of breath.   Cardiovascular: Positive for palpitations.  Gastrointestinal: Negative.   Endocrine: Negative.   Genitourinary: Negative.   Musculoskeletal: Negative.   Neurological: Negative.  Negative for headaches.  Hematological: Negative.   Psychiatric/Behavioral: Negative.   All other systems reviewed and are negative.      Objective:   Physical Exam  Constitutional: She is oriented to person, place, and time. She appears well-developed and well-nourished. No distress.  HENT:  Head: Normocephalic and atraumatic.  Right Ear: External ear normal.  Left Ear: External ear normal.  Nose: Nose normal.  Mouth/Throat: Oropharynx is clear and moist.  Eyes: Pupils are equal, round, and reactive to light.  Neck: Normal range of motion. Neck supple. No thyromegaly present.  Cardiovascular: Normal rate, regular rhythm, normal heart sounds and intact distal pulses.   No murmur heard. Pulmonary/Chest: Effort normal and breath sounds normal. No respiratory distress. She has no wheezes.  Abdominal: Soft. Bowel sounds  are normal. She exhibits no distension. There is no tenderness.  Musculoskeletal: Normal range of motion. She exhibits no edema or tenderness.  Neurological: She is alert and oriented to person,  place, and time. She has normal reflexes. No cranial nerve deficit.  Skin: Skin is warm and dry.  Psychiatric: She has a normal mood and affect. Her behavior is normal. Judgment and thought content normal.  Vitals reviewed.   BP 132/61 mmHg  Pulse 64  Temp(Src) 97.1 F (36.2 C) (Oral)  Ht 5' 2" (1.575 m)  Wt 210 lb 9.6 oz (95.528 kg)  BMI 38.51 kg/m2  LMP  (Approximate)        Assessment & Plan:  1. PVD (peripheral vascular disease) ([place]) - CMP14+EGFR  2. PAD (peripheral artery disease) ([facility]) - CMP14+EGFR  3. NSTEMI (non-ST elevated myocardial infarction) ([place]) - CMP14+EGFR  4. Diabetes mellitus with peripheral vascular disease ([place]) - POCT glycosylated hemoglobin (Hb A1C) - CMP14+EGFR - Microalbumin / creatinine urine ratio - insulin aspart protamine - aspart (NOVOLOG MIX 70/30 FLEXPEN) (70-30) 100 UNIT/ML FlexPen; Use 75 unit TID with meals  Dispense: 25 pen; Refill: 6  5. Chronic diastolic heart failure ([facility]) - CMP14+EGFR  6. Coronary artery disease involving native coronary artery of native heart without angina pectoris - CMP14+EGFR  7. Vaginal atrophy - CMP14+EGFR  8. Hyperkalemia  - CMP14+EGFR  9. Hyperlipidemia - CMP14+EGFR - Lipid panel   Continue all meds Labs pending Health Maintenance reviewed Diet and exercise encouraged RTO 3 months  Evelina Dun, FNP

## 2015-03-16 ENCOUNTER — Other Ambulatory Visit: Payer: Self-pay | Admitting: Family

## 2015-03-16 LAB — MICROALBUMIN / CREATININE URINE RATIO
Creatinine, Urine: 8.7 mg/dL
Microalbumin, Urine: <3 ug/mL

## 2015-03-16 LAB — LIPID PANEL
CHOL/HDL RATIO: 2.5 ratio (ref 0.0–4.4)
Cholesterol, Total: 181 mg/dL (ref 100–199)
HDL: 72 mg/dL (ref >39)
LDL CALC: 83 mg/dL (ref 0–99)
Triglycerides: 130 mg/dL (ref 0–149)
VLDL Cholesterol Cal: 26 mg/dL (ref 5–40)

## 2015-03-16 LAB — CMP14+EGFR
A/G RATIO: 1.2 (ref 1.1–2.5)
ALT: 21 IU/L (ref 0–32)
AST: 22 IU/L (ref 0–40)
Albumin: 4.6 g/dL (ref 3.6–4.8)
Alkaline Phosphatase: 68 IU/L (ref 39–117)
BUN/Creatinine Ratio: 28 — ABNORMAL HIGH (ref 11–26)
BUN: 20 mg/dL (ref 8–27)
Bilirubin Total: 0.2 mg/dL (ref 0.0–1.2)
CALCIUM: 10.2 mg/dL (ref 8.7–10.3)
CO2: 24 mmol/L (ref 18–29)
CREATININE: 0.71 mg/dL (ref 0.57–1.00)
Chloride: 95 mmol/L — ABNORMAL LOW (ref 96–106)
GFR, EST AFRICAN AMERICAN: 105 mL/min/{1.73_m2} (ref >59)
GFR, EST NON AFRICAN AMERICAN: 91 mL/min/{1.73_m2} (ref >59)
GLOBULIN, TOTAL: 3.9 g/dL (ref 1.5–4.5)
Glucose: 106 mg/dL — ABNORMAL HIGH (ref 65–99)
POTASSIUM: 5.2 mmol/L (ref 3.5–5.2)
SODIUM: 142 mmol/L (ref 134–144)
TOTAL PROTEIN: 8.5 g/dL (ref 6.0–8.5)

## 2015-03-28 ENCOUNTER — Other Ambulatory Visit: Payer: Self-pay | Admitting: Family

## 2015-04-10 ENCOUNTER — Telehealth: Payer: Self-pay

## 2015-04-10 ENCOUNTER — Other Ambulatory Visit: Payer: Self-pay | Admitting: Family

## 2015-04-10 NOTE — Telephone Encounter (Signed)
Yes

## 2015-04-10 NOTE — Telephone Encounter (Signed)
Insurance prefers generic for Crestor 20   Is that OK?

## 2015-04-10 NOTE — Telephone Encounter (Signed)
Generic crestor sent to pharmacy. Patient notified

## 2015-04-17 ENCOUNTER — Encounter: Payer: Self-pay | Admitting: Family

## 2015-04-23 ENCOUNTER — Other Ambulatory Visit: Payer: Self-pay | Admitting: *Deleted

## 2015-04-23 ENCOUNTER — Encounter: Payer: Self-pay | Admitting: Family

## 2015-04-23 ENCOUNTER — Ambulatory Visit (INDEPENDENT_AMBULATORY_CARE_PROVIDER_SITE_OTHER)
Admission: RE | Admit: 2015-04-23 | Discharge: 2015-04-23 | Disposition: A | Discharge status: Home / Self Care | Payer: 59 | Source: Ambulatory Visit | Attending: Family | Admitting: Family

## 2015-04-23 ENCOUNTER — Ambulatory Visit (INDEPENDENT_AMBULATORY_CARE_PROVIDER_SITE_OTHER): Payer: 59 | Admitting: Family

## 2015-04-23 ENCOUNTER — Ambulatory Visit (HOSPITAL_COMMUNITY)
Admission: RE | Admit: 2015-04-23 | Discharge: 2015-04-23 | Disposition: A | Discharge status: Home / Self Care | Payer: 59 | Source: Ambulatory Visit | Attending: Family | Admitting: Family

## 2015-04-23 VITALS — BP 105/54 | HR 62 | Ht 62.0 in | Wt 210.0 lb

## 2015-04-23 DIAGNOSIS — I110 Hypertensive heart disease with heart failure: Secondary | ICD-10-CM | POA: Diagnosis not present

## 2015-04-23 DIAGNOSIS — R938 Abnormal findings on diagnostic imaging of other specified body structures: Secondary | ICD-10-CM | POA: Insufficient documentation

## 2015-04-23 DIAGNOSIS — I6523 Occlusion and stenosis of bilateral carotid arteries: Secondary | ICD-10-CM | POA: Insufficient documentation

## 2015-04-23 DIAGNOSIS — I779 Disorder of arteries and arterioles, unspecified: Secondary | ICD-10-CM | POA: Diagnosis not present

## 2015-04-23 DIAGNOSIS — I5032 Chronic diastolic (congestive) heart failure: Secondary | ICD-10-CM | POA: Diagnosis not present

## 2015-04-23 DIAGNOSIS — E1151 Type 2 diabetes mellitus with diabetic peripheral angiopathy without gangrene: Secondary | ICD-10-CM

## 2015-04-23 DIAGNOSIS — Z95828 Presence of other vascular implants and grafts: Secondary | ICD-10-CM

## 2015-04-23 DIAGNOSIS — Z87891 Personal history of nicotine dependence: Secondary | ICD-10-CM

## 2015-04-23 DIAGNOSIS — R0989 Other specified symptoms and signs involving the circulatory and respiratory systems: Secondary | ICD-10-CM | POA: Diagnosis present

## 2015-04-23 DIAGNOSIS — E119 Type 2 diabetes mellitus without complications: Secondary | ICD-10-CM | POA: Diagnosis not present

## 2015-04-23 DIAGNOSIS — E669 Obesity, unspecified: Secondary | ICD-10-CM

## 2015-04-23 DIAGNOSIS — E785 Hyperlipidemia, unspecified: Secondary | ICD-10-CM | POA: Diagnosis not present

## 2015-04-23 DIAGNOSIS — I739 Peripheral vascular disease, unspecified: Secondary | ICD-10-CM

## 2015-04-23 NOTE — Patient Instructions (Signed)
Peripheral Vascular Disease Peripheral vascular disease (PVD) is a disease of the blood vessels that are not part of your heart and brain. A simple term for PVD is poor circulation. In most cases, PVD narrows the blood vessels that carry blood from your heart to the rest of your body. This can result in a decreased supply of blood to your arms, legs, and internal organs, like your stomach or kidneys. However, it most often affects a person's lower legs and feet. There are two types of PVD.  Organic PVD. This is the more common type. It is caused by damage to the structure of blood vessels.  Functional PVD. This is caused by conditions that make blood vessels contract and tighten (spasm). Without treatment, PVD tends to get worse over time. PVD can also lead to acute ischemic limb. This is when an arm or limb suddenly has trouble getting enough blood. This is a medical emergency. CAUSES Each type of PVD has many different causes. The most common cause of PVD is buildup of a fatty material (plaque) inside of your arteries (atherosclerosis). Small amounts of plaque can break off from the walls of the blood vessels and become lodged in a smaller artery. This blocks blood flow and can cause acute ischemic limb. Other common causes of PVD include:  Blood clots that form inside of blood vessels.  Injuries to blood vessels.  Diseases that cause inflammation of blood vessels or cause blood vessel spasms.  Health behaviors and health history that increase your risk of developing PVD. RISK FACTORS  You may have a greater risk of PVD if you:  Have a family history of PVD.  Have certain medical conditions, including:  High cholesterol.  Diabetes.  High blood pressure (hypertension).  Coronary heart disease.  Past problems with blood clots.  Past injury, such as burns or a broken bone. These may have damaged blood vessels in your limbs.  Buerger disease. This is caused by inflamed blood  vessels in your hands and feet.  Some forms of arthritis.  Rare birth defects that affect the arteries in your legs.  Use tobacco.  Do not get enough exercise.  Are obese.  Are age 50 or older. SIGNS AND SYMPTOMS  PVD may cause many different symptoms. Your symptoms depend on what part of your body is not getting enough blood. Some common signs and symptoms include:  Cramps in your lower legs. This may be a symptom of poor leg circulation (claudication).  Pain and weakness in your legs while you are physically active that goes away when you rest (intermittent claudication).  Leg pain when at rest.  Leg numbness, tingling, or weakness.  Coldness in a leg or foot, especially when compared with the other leg.  Skin or hair changes. These can include:  Hair loss.  Shiny skin.  Pale or bluish skin.  Thick toenails.  Inability to get or maintain an erection (erectile dysfunction). People with PVD are more prone to developing ulcers and sores on their toes, feet, or legs. These may take longer than normal to heal. DIAGNOSIS Your health care provider may diagnose PVD from your signs and symptoms. The health care provider will also do a physical exam. You may have tests to find out what is causing your PVD and determine its severity. Tests may include:  Blood pressure recordings from your arms and legs and measurements of the strength of your pulses (pulse volume recordings).  Imaging studies using sound waves to take pictures of   the blood flow through your blood vessels (Doppler ultrasound).  Injecting a dye into your blood vessels before having imaging studies using:  X-rays (angiogram or arteriogram).  Computer-generated X-rays (CT angiogram).  A powerful electromagnetic field and a computer (magnetic resonance angiogram or MRA). TREATMENT Treatment for PVD depends on the cause of your condition and the severity of your symptoms. It also depends on your age. Underlying  causes need to be treated and controlled. These include long-lasting (chronic) conditions, such as diabetes, high cholesterol, and high blood pressure. You may need to first try making lifestyle changes and taking medicines. Surgery may be needed if these do not work. Lifestyle changes may include:  Quitting smoking.  Exercising regularly.  Following a low-fat, low-cholesterol diet. Medicines may include:  Blood thinners to prevent blood clots.  Medicines to improve blood flow.  Medicines to improve your blood cholesterol levels. Surgical procedures may include:  A procedure that uses an inflated balloon to open a blocked artery and improve blood flow (angioplasty).  A procedure to put in a tube (stent) to keep a blocked artery open (stent implant).  Surgery to reroute blood flow around a blocked artery (peripheral bypass surgery).  Surgery to remove dead tissue from an infected wound on the affected limb.  Amputation. This is surgical removal of the affected limb. This may be necessary in cases of acute ischemic limb that are not improved through medical or surgical treatments. HOME CARE INSTRUCTIONS  Take medicines only as directed by your health care provider.  Do not use any tobacco products, including cigarettes, chewing tobacco, or electronic cigarettes. If you need help quitting, ask your health care provider.  Lose weight if you are overweight, and maintain a healthy weight as directed by your health care provider.  Eat a diet that is low in fat and cholesterol. If you need help, ask your health care provider.  Exercise regularly. Ask your health care provider to suggest some good activities for you.  Use compression stockings or other mechanical devices as directed by your health care provider.  Take good care of your feet.  Wear comfortable shoes that fit well.  Check your feet often for any cuts or sores. SEEK MEDICAL CARE IF:  You have cramps in your legs  while walking.  You have leg pain when you are at rest.  You have coldness in a leg or foot.  Your skin changes.  You have erectile dysfunction.  You have cuts or sores on your feet that are not healing. SEEK IMMEDIATE MEDICAL CARE IF:  Your arm or leg turns cold and blue.  Your arms or legs become red, warm, swollen, painful, or numb.  You have chest pain or trouble breathing.  You suddenly have weakness in your face, arm, or leg.  You become very confused or lose the ability to speak.  You suddenly have a very bad headache or lose your vision.   This information is not intended to replace advice given to you by your health care provider. Make sure you discuss any questions you have with your health care provider.   Document Released: 03/13/2004 Document Revised: 02/24/2014 Document Reviewed: 07/14/2013 Elsevier Interactive Patient Education 2016 [employer] Inc[employer]  

## 2015-04-23 NOTE — Progress Notes (Signed)
[facility] [place] HISTORY AND PHYSICAL   MRN : [id number]  History of Present Illness:   Leah Ferrell is a 65 y.o. female patient of Dr. Kellie Simmering who is s/p interposition graft replacement of her left common femoral artery from the external iliac to the profunda on 11-02-13. 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  She has some numbness in the medial aspect of her left thigh. She returns today for follow up. She was initially referred by Dr. Fletcher [name] for evaluation of severe claudication left leg. Patient had angiography which revealed a left common iliac stenosis which was treated with stenting. 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 She also has occlusion of the mid left SFA. She has a patent popliteal artery with three-vessel off. She was only able to ambulate 50 feet before stopping prior to revascuarization. She had to use a cart to shop. She has no rest pain or history of nonhealing ulcers infection or gangrene. She does have diabetes mellitus. She quit smoking in 2005. She had a cardiac stent placed and is on Plavix.  She was using her treadmill until she fractured her right ankle. It is unclear how much and how often she is walking, and if she is having claudications sx's; she denies non healing wounds.  She states that her left hip pain limits her walking and is due for another xray of this soon. She has a speech inflection and it is difficult to understand some of what she is saying.  She is seeing an endocrinologist for thyroid nodules, had a bx per pt.; states she will be getting yearly [place] of her thyroid.  Pt denies any history of stroke or TIA.   The patient reports New Medical or Surgical History: none  Pt Diabetic: Yes, diagnosed with DM about 2000; Review of records: A1C in March 2016 was 7.4, 8.1 in January 2017 Pt smoker: former smoker, from 1966-2005  Pt meds  include: Statin :Yes Betablocker: Yes ASA: Yes Other anticoagulants/antiplatelets: Plavix    Current Outpatient Prescriptions  Medication Sig Dispense Refill  . amLODipine (NORVASC) 10 MG tablet TAKE ONE TABLET BY MOUTH ONCE DAILY 30 tablet 2  . Ascorbic Acid (VITAMIN C) 500 MG CAPS Take 1 capsule by mouth daily.     Marland Kitchen aspirin EC 81 MG tablet Take 81 mg by mouth daily.     . Calcium Carbonate-Vit D-Min (CALTRATE 600+D PLUS MINERALS) 600-800 MG-UNIT TABS Take 1 tablet by mouth 2 (two) times daily.    . canagliflozin (INVOKANA) 300 MG TABS tablet Take 300 mg by mouth daily before breakfast. 90 tablet 3  . clopidogrel (PLAVIX) 75 MG tablet TAKE ONE TABLET BY MOUTH ONCE DAILY 90 tablet 2  . conjugated estrogens (PREMARIN) vaginal cream Place 1 Applicatorful vaginally daily. 42.5 g 3  . CRESTOR 20 MG tablet TAKE ONE TABLET BY MOUTH ONCE DAILY 90 tablet 1  . fish oil-omega-3 fatty acids 1000 MG capsule Take 1 g by mouth every morning.    . furosemide (LASIX) 40 MG tablet TAKE ONE TABLET BY MOUTH ONCE DAILY 90 tablet 2  . hydroxypropyl methylcellulose (ISOPTO TEARS) 2.5 % ophthalmic solution Place 1 drop into both eyes 3 (three) times daily as needed (for dry eyes).    . Hypromell-Glycerin-Naphazoline 0.8-0.25-0.012 % SOLN Apply 1 application to eye daily as needed (dry eyes).     . insulin aspart protamine - aspart (NOVOLOG MIX 70/30 FLEXPEN) (70-30) 100  UNIT/ML FlexPen Use 75 unit TID with meals 25 pen 6  . lisinopril (PRINIVIL,ZESTRIL) 20 MG tablet TAKE ONE TABLET BY MOUTH TWICE DAILY 180 tablet 1  . metoprolol succinate (TOPROL-XL) 50 MG 24 hr tablet TAKE ONE & ONE-HALF TABLETS BY MOUTH TWICE DAILY 90 tablet 4  . montelukast (SINGULAIR) 10 MG tablet TAKE ONE TABLET BY MOUTH ONCE DAILY AT BEDTIME 90 tablet 1  . Multiple Vitamin (MULTIVITAMIN WITH MINERALS) TABS Take 1 tablet by mouth daily.    . Naftifine HCl 2 % CREA Apply to bilateral feet BID 45 g 1  . nitroGLYCERIN (NITROSTAT) 0.4 MG SL  tablet Place 1 tablet (0.4 mg total) under the tongue every 5 (five) minutes x 3 doses as needed for chest pain (up to 3 doses). 25 tablet 4  . potassium chloride SA (K-DUR,KLOR-CON) 20 MEQ tablet Take 1 tablet (20 mEq total) by mouth daily. 90 tablet 1  . PROVENTIL HFA 108 (90 BASE) MCG/ACT inhaler INHALE ONE TO TWO PUFFS BY MOUTH EVERY 6 HOURS AS NEEDED FOR WHEEZING OR FOR SHORTNESS OF BREATH 7 g 3  . RELION PEN NEEDLES 29G X 12MM MISC USE ONE NEEDELE TO INJECT INSULIN SUBCUTANEOUSLY THREE TIMES DAILY 90 each 12  . traMADol (ULTRAM) 50 MG tablet Take 1 tablet (50 mg total) by mouth every 6 (six) hours as needed for moderate pain. 30 tablet 0   No current facility-administered medications for this visit.    Past Medical History  Diagnosis Date  . Hypertension   . Diabetes mellitus without complication ([place])   . Hyperlipidemia   . CAD (coronary artery disease)     a. 04/2012 NSTEMI: s/p DES to LAD.  Marland Kitchen PVD (peripheral vascular disease) ([place])     a. 04/2012 ABI: R 0.49, L 0.39. Angiography 06/14: Significant ostial left common iliac artery stenosis extending into the distal aorta, severe left common femoral artery stenosis, bilateral SFA occlusion with heavy calcifications. Functionally, one-vessel runoff bilaterally below the knee  . Chronic diastolic CHF (congestive heart failure) ([place])   . Leukocytosis   . Morbid obesity ([place])   . NSTEMI (non-ST elevated myocardial infarction) ([place]) 04/27/2012  . Dysrhythmia   . Shortness of breath   . PONV (postoperative nausea and vomiting)   . Environmental and seasonal allergies     Social History Social History  Substance Use Topics  . Smoking status: Former Smoker -- 1.00 packs/day for 36 years    Types: Cigarettes    Start date: 04/21/1964    Quit date: 02/17/2001  . Smokeless tobacco: Former Systems developer    Quit date: 04/20/2001  . Alcohol Use: No    Family History Family History  Problem Relation Age of Onset  . Diabetes Mother   .  Hyperlipidemia Mother   . Hypertension Mother   . Peripheral vascular disease Mother     Surgical History Past Surgical History  Procedure Laterality Date  . 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 Lad stent    . Iliac artery stent Left 08/31/2013  . Cardiac catheterization      stents X 2  . Endarterectomy femoral Left 11/02/2013    Procedure: RESECTION LEFT COMMON FEMORAL ARTERY AND INSERTION OF INTERPOSITION 23mm HEMASHIELD GRAFT FROM LEFT EXTERNAL  ILIAC ARTERY TO LEFT PROFUNDA  FEMORIS ARTERY;  Surgeon: Mal Misty, MD;  Location: [place];  Service: Vascular;  Laterality: Left;  . Left heart catheterization with coronary angiogram N/A 04/23/2012  Procedure: LEFT HEART CATHETERIZATION WITH CORONARY ANGIOGRAM;  Surgeon: Peter M Martinique, MD;  Location: [facility] CATH LAB;  Service: Cardiovascular;  Laterality: N/A;  . Coronary angiogram  04/27/2012    Procedure: CORONARY ANGIOGRAM;  Surgeon: Thayer Headings, MD;  Location: [facility] CATH LAB;  Service: Cardiovascular;;  . Abdominal aortagram N/A 07/21/2012    Procedure: ABDOMINAL Maxcine Ham;  Surgeon: Wellington Hampshire, MD;  Location: [place] CATH LAB;  Service: Cardiovascular;  Laterality: N/A;  . Lower extremity angiogram Left 08/31/2013    Procedure: LOWER EXTREMITY ANGIOGRAM;  Surgeon: Wellington Hampshire, MD;  Location: [facility] CATH LAB;  Service: Cardiovascular;  Laterality: Left;  . Percutaneous stent intervention Left 08/31/2013    Procedure: PERCUTANEOUS STENT INTERVENTION;  Surgeon: Wellington Hampshire, MD;  Location: [place] CATH LAB;  Service: Cardiovascular;  Laterality: Left;  Common Iliac artery  . Fracture surgery Right Jul 07, 2014    Right Foot-  Pt.fell  at Home  by Heat duct    Allergies  Allergen Reactions  . Tape Rash    Current Outpatient Prescriptions  Medication Sig Dispense Refill  . amLODipine (NORVASC) 10 MG tablet TAKE ONE TABLET BY MOUTH ONCE DAILY 30 tablet 2  . Ascorbic Acid (VITAMIN C) 500 MG CAPS Take 1 capsule by mouth daily.      Marland Kitchen aspirin EC 81 MG tablet Take 81 mg by mouth daily.     . Calcium Carbonate-Vit D-Min (CALTRATE 600+D PLUS MINERALS) 600-800 MG-UNIT TABS Take 1 tablet by mouth 2 (two) times daily.    . canagliflozin (INVOKANA) 300 MG TABS tablet Take 300 mg by mouth daily before breakfast. 90 tablet 3  . clopidogrel (PLAVIX) 75 MG tablet TAKE ONE TABLET BY MOUTH ONCE DAILY 90 tablet 2  . conjugated estrogens (PREMARIN) vaginal cream Place 1 Applicatorful vaginally daily. 42.5 g 3  . CRESTOR 20 MG tablet TAKE ONE TABLET BY MOUTH ONCE DAILY 90 tablet 1  . fish oil-omega-3 fatty acids 1000 MG capsule Take 1 g by mouth every morning.    . furosemide (LASIX) 40 MG tablet TAKE ONE TABLET BY MOUTH ONCE DAILY 90 tablet 2  . hydroxypropyl methylcellulose (ISOPTO TEARS) 2.5 % ophthalmic solution Place 1 drop into both eyes 3 (three) times daily as needed (for dry eyes).    . Hypromell-Glycerin-Naphazoline 0.8-0.25-0.012 % SOLN Apply 1 application to eye daily as needed (dry eyes).     . insulin aspart protamine - aspart (NOVOLOG MIX 70/30 FLEXPEN) (70-30) 100 UNIT/ML FlexPen Use 75 unit TID with meals 25 pen 6  . lisinopril (PRINIVIL,ZESTRIL) 20 MG tablet TAKE ONE TABLET BY MOUTH TWICE DAILY 180 tablet 1  . metoprolol succinate (TOPROL-XL) 50 MG 24 hr tablet TAKE ONE & ONE-HALF TABLETS BY MOUTH TWICE DAILY 90 tablet 4  . montelukast (SINGULAIR) 10 MG tablet TAKE ONE TABLET BY MOUTH ONCE DAILY AT BEDTIME 90 tablet 1  . Multiple Vitamin (MULTIVITAMIN WITH MINERALS) TABS Take 1 tablet by mouth daily.    . Naftifine HCl 2 % CREA Apply to bilateral feet BID 45 g 1  . nitroGLYCERIN (NITROSTAT) 0.4 MG SL tablet Place 1 tablet (0.4 mg total) under the tongue every 5 (five) minutes x 3 doses as needed for chest pain (up to 3 doses). 25 tablet 4  . potassium chloride SA (K-DUR,KLOR-CON) 20 MEQ tablet Take 1 tablet (20 mEq total) by mouth daily. 90 tablet 1  . PROVENTIL HFA 108 (90 BASE) MCG/ACT inhaler INHALE ONE TO TWO PUFFS BY  MOUTH EVERY  6 HOURS AS NEEDED FOR WHEEZING OR FOR SHORTNESS OF BREATH 7 g 3  . RELION PEN NEEDLES 29G X 12MM MISC USE ONE NEEDELE TO INJECT INSULIN SUBCUTANEOUSLY THREE TIMES DAILY 90 each 12  . traMADol (ULTRAM) 50 MG tablet Take 1 tablet (50 mg total) by mouth every 6 (six) hours as needed for moderate pain. 30 tablet 0   No current facility-administered medications for this visit.     REVIEW OF SYSTEMS: See HPI for pertinent positives and negatives.  Physical Examination Filed Vitals:   04/23/15 1334  BP: 105/54  Pulse: 62  Height: 5\' 2"  (1.575 m)  Weight: 210 lb (95.255 kg)  SpO2: 96%   Body mass index is 38.4 kg/(m^2).  General: A&O x 3, WDWN, obese female, central obesity. Gait: normal Eyes: PERRLA. Pulmonary: CTAB, without wheezes , rales or rhonchi. Cardiac: regular rhythm, no detected murmur.     Carotid Bruits Right Left   negative Negative  Aorta is not palpable. Radial pulses: 2+ palpable and =   VASCULAR EXAM: Extremities without ischemic changes, without Gangrene; without open wounds.     LE Pulses Right Left   FEMORAL not palpable(obese) not palpable (obese)    POPLITEAL not palpable  not palpable   POSTERIOR TIBIAL not palpable  not palpable    DORSALIS PEDIS  ANTERIOR TIBIAL not palpable  not palpable    Abdomen: softly obese, NT, no palpable masses. Skin: no rashes, no ulcers. Musculoskeletal: no muscle wasting or atrophy. Neurologic: A&O X 3; Appropriate Affect ; SENSATION: normal; MOTOR FUNCTION: moving all extremities equally, motor strength 5/5 throughout. Speech is fluent/normal. CN 2-12 intact. Speech is difficult to understand at times, pt is talkative.                Non-Invasive  Vascular Imaging (04/23/2015):  LOWER EXTREMITY ARTERIAL DUPLEX EVALUATION    INDICATION: Peripheral vascular disease     PREVIOUS INTERVENTION(S): Resection left common femoral artery and insertion of interposition graft from the left external iliac to the profunda artery 2015.    DUPLEX EXAM:     RIGHT  LEFT   Peak Systolic Velocity (cm/s) Ratio (if abnormal) Waveform  Peak Systolic Velocity (cm/s) Ratio (if abnormal) Waveform     Inflow Artery 63  M      Proximal Anastomosis 93  M     Proximal Graft 99  M     Mid Graft 112  M      Distal Graft 67  M     Distal Anastomosis 66  M     Outflow Artery 76  M  0.60 Today's ABI / TBI 0.60  0.60 Previous ABI / TBI (10/09/2014  ) 0.57    Waveform:    M - Monophasic       B - Biphasic       T - Triphasic  If Ankle Brachial Index (ABI) or Toe Brachial Index (TBI) performed, please see complete report     ADDITIONAL FINDINGS:     IMPRESSION: Patent left external iliac to profunda femoral artery graft without evidence of stenosis. Known left superficial femoral artery occlusive disease.    Compared to the previous exam:  No significant change in comparison to the last exam on 10/09/2014.     ASSESSMENT:  Leah Ferrell is a 65 y.o. female who is s/p interposition graft replacement of her left common femoral artery from the external iliac to the profunda on 11-02-13. Bilateral superficial femoral arteries are known to be  totally occluded by 2014 arteriogram. Angiography also revealed a left common iliac stenosis which was treated with stenting.  It is difficult to ascertain whether she is having claudication sx's with walking, she does not seem to walk much, she has no signs of ischemia in her feet/legs.  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   Today's left LE arterial duplex suggests a patent left external iliac to profunda femoral artery graft without evidence of stenosis. Known left superficial femoral artery occlusive  disease. No significant change in comparison to the last exam on 10/09/2014.  Bilateral ABI's remain stable with moderate arterial occlusive disease and all monophasic waveforms.   10/09/14 carotid duplex suggested minimal bilateral ICA stenosis.  Her atherosclerotic risk factors include uncontrolled DM, former smoker, CAD, and central obesity.  PLAN:   Graduated walking program discussed.  Based on today's exam and non-invasive vascular lab results, the patient will follow up in 6 months with the following tests: ABI's and left LE arterial Duplex; carotid Duplex in a year. She knows to return sooner for concerns re circulation in her feet and legs.  I discussed in depth with the patient the nature of atherosclerosis, and emphasized the importance of maximal medical management including strict control of blood pressure, blood glucose, and lipid levels, obtaining regular exercise, and cessation of smoking.  The patient is aware that without maximal medical management the underlying atherosclerotic disease process will progress, limiting the benefit of any interventions. . The patient was given information about stroke prevention and what symptoms should prompt the patient to seek immediate medical care.  The patient was given information about PAD including signs, symptoms, treatment, what symptoms should prompt the patient to seek immediate medical care, and risk reduction measures to take. Thank you for allowing [place] to participate in this patient's care.  Clemon Chambers, RN, MSN, FNP-C [facility] Office: [phone]  Clinic MD: Trula Slade  04/23/2015 1:38 PM

## 2015-05-10 ENCOUNTER — Telehealth: Payer: Self-pay | Admitting: Cardiovascular Disease

## 2015-05-17 ENCOUNTER — Other Ambulatory Visit: Payer: Self-pay | Admitting: Family

## 2015-05-23 ENCOUNTER — Encounter: Payer: Self-pay | Admitting: *Deleted

## 2015-06-08 ENCOUNTER — Other Ambulatory Visit: Payer: Self-pay | Admitting: Family

## 2015-06-10 ENCOUNTER — Other Ambulatory Visit: Payer: Self-pay | Admitting: Family

## 2015-06-15 ENCOUNTER — Ambulatory Visit: Payer: 59 | Admitting: Family

## 2015-06-19 ENCOUNTER — Ambulatory Visit: Payer: 59 | Admitting: Family

## 2015-06-19 ENCOUNTER — Other Ambulatory Visit: Payer: Self-pay | Admitting: Family

## 2015-07-02 ENCOUNTER — Other Ambulatory Visit: Payer: Self-pay | Admitting: Family

## 2015-07-02 ENCOUNTER — Ambulatory Visit (INDEPENDENT_AMBULATORY_CARE_PROVIDER_SITE_OTHER): Payer: 59 | Admitting: Family

## 2015-07-02 ENCOUNTER — Ambulatory Visit (INDEPENDENT_AMBULATORY_CARE_PROVIDER_SITE_OTHER): Payer: 59

## 2015-07-02 ENCOUNTER — Encounter: Payer: Self-pay | Admitting: Family

## 2015-07-02 VITALS — BP 140/66 | HR 54 | Temp 97.1°F | Ht 62.0 in | Wt 210.0 lb

## 2015-07-02 DIAGNOSIS — E785 Hyperlipidemia, unspecified: Secondary | ICD-10-CM

## 2015-07-02 DIAGNOSIS — I5032 Chronic diastolic (congestive) heart failure: Secondary | ICD-10-CM | POA: Diagnosis not present

## 2015-07-02 DIAGNOSIS — I214 Non-ST elevation (NSTEMI) myocardial infarction: Secondary | ICD-10-CM

## 2015-07-02 DIAGNOSIS — Z1211 Encounter for screening for malignant neoplasm of colon: Secondary | ICD-10-CM | POA: Diagnosis not present

## 2015-07-02 DIAGNOSIS — E1151 Type 2 diabetes mellitus with diabetic peripheral angiopathy without gangrene: Secondary | ICD-10-CM | POA: Diagnosis not present

## 2015-07-02 DIAGNOSIS — I2510 Atherosclerotic heart disease of native coronary artery without angina pectoris: Secondary | ICD-10-CM | POA: Diagnosis not present

## 2015-07-02 DIAGNOSIS — E875 Hyperkalemia: Secondary | ICD-10-CM

## 2015-07-02 DIAGNOSIS — Z780 Asymptomatic menopausal state: Secondary | ICD-10-CM

## 2015-07-02 DIAGNOSIS — I739 Peripheral vascular disease, unspecified: Secondary | ICD-10-CM

## 2015-07-02 DIAGNOSIS — N952 Postmenopausal atrophic vaginitis: Secondary | ICD-10-CM | POA: Diagnosis not present

## 2015-07-02 DIAGNOSIS — I10 Essential (primary) hypertension: Secondary | ICD-10-CM | POA: Diagnosis not present

## 2015-07-02 LAB — CMP14+EGFR
A/G RATIO: 1.1 — AB (ref 1.2–2.2)
ALBUMIN: 4 g/dL (ref 3.6–4.8)
ALT: 28 IU/L (ref 0–32)
AST: 24 IU/L (ref 0–40)
Alkaline Phosphatase: 62 IU/L (ref 39–117)
BILIRUBIN TOTAL: 0.3 mg/dL (ref 0.0–1.2)
BUN/Creatinine Ratio: 17 (ref 12–28)
BUN: 14 mg/dL (ref 8–27)
CHLORIDE: 96 mmol/L (ref 96–106)
CO2: 26 mmol/L (ref 18–29)
Calcium: 9.7 mg/dL (ref 8.7–10.3)
Creatinine, Ser: 0.81 mg/dL (ref 0.57–1.00)
GFR calc non Af Amer: 77 mL/min/{1.73_m2} (ref >59)
GFR, EST AFRICAN AMERICAN: 89 mL/min/{1.73_m2} (ref >59)
GLOBULIN, TOTAL: 3.5 g/dL (ref 1.5–4.5)
GLUCOSE: 119 mg/dL — AB (ref 65–99)
Potassium: 4.4 mmol/L (ref 3.5–5.2)
Sodium: 138 mmol/L (ref 134–144)
TOTAL PROTEIN: 7.5 g/dL (ref 6.0–8.5)

## 2015-07-02 LAB — LIPID PANEL
Chol/HDL Ratio: 2.3 ratio units (ref 0.0–4.4)
Cholesterol, Total: 166 mg/dL (ref 100–199)
HDL: 71 mg/dL (ref >39)
LDL Calculated: 75 mg/dL (ref 0–99)
Triglycerides: 100 mg/dL (ref 0–149)
VLDL CHOLESTEROL CAL: 20 mg/dL (ref 5–40)

## 2015-07-02 LAB — BAYER DCA HB A1C WAIVED: HB A1C (BAYER DCA - WAIVED): 7.5 % — ABNORMAL HIGH (ref <7.0)

## 2015-07-02 NOTE — Patient Instructions (Signed)
Health Maintenance, Female Adopting a healthy lifestyle and getting preventive care can go a long way to promote health and wellness. Talk with your health care provider about what schedule of regular examinations is right for you. This is a good chance for you to check in with your provider about disease prevention and staying healthy. In between checkups, there are plenty of things you can do on your own. Experts have done a lot of research about which lifestyle changes and preventive measures are most likely to keep you healthy. Ask your health care provider for more information. WEIGHT AND DIET  Eat a healthy diet  Be sure to include plenty of vegetables, fruits, low-fat dairy products, and lean protein.  Do not eat a lot of foods high in solid fats, added sugars, or salt.  Get regular exercise. This is one of the most important things you can do for your health.  Most adults should exercise for at least 150 minutes each week. The exercise should increase your heart rate and make you sweat (moderate-intensity exercise).  Most adults should also do strengthening exercises at least twice a week. This is in addition to the moderate-intensity exercise.  Maintain a healthy weight  Body mass index (BMI) is a measurement that can be used to identify possible weight problems. It estimates body fat based on height and weight. Your health care provider can help determine your BMI and help you achieve or maintain a healthy weight.  For females 20 years of age and older:   A BMI below 18.5 is considered underweight.  A BMI of 18.5 to 24.9 is normal.  A BMI of 25 to 29.9 is considered overweight.  A BMI of 30 and above is considered obese.  Watch levels of cholesterol and blood lipids  You should start having your blood tested for lipids and cholesterol at 64 years of age, then have this test every 5 years.  You may need to have your cholesterol levels checked more often if:  Your lipid  or cholesterol levels are high.  You are older than 64 years of age.  You are at high risk for heart disease.  CANCER SCREENING   Lung Cancer  Lung cancer screening is recommended for adults 55-80 years old who are at high risk for lung cancer because of a history of smoking.  A yearly low-dose CT scan of the lungs is recommended for people who:  Currently smoke.  Have quit within the past 15 years.  Have at least a 30-pack-year history of smoking. A pack year is smoking an average of one pack of cigarettes a day for 1 year.  Yearly screening should continue until it has been 15 years since you quit.  Yearly screening should stop if you develop a health problem that would prevent you from having lung cancer treatment.  Breast Cancer  Practice breast self-awareness. This means understanding how your breasts normally appear and feel.  It also means doing regular breast self-exams. Let your health care provider know about any changes, no matter how small.  If you are in your 20s or 30s, you should have a clinical breast exam (CBE) by a health care provider every 1-3 years as part of a regular health exam.  If you are 40 or older, have a CBE every year. Also consider having a breast X-ray (mammogram) every year.  If you have a family history of breast cancer, talk to your health care provider about genetic screening.  If you   are at high risk for breast cancer, talk to your health care provider about having an MRI and a mammogram every year.  Breast cancer gene (BRCA) assessment is recommended for women who have family members with BRCA-related cancers. BRCA-related cancers include:  Breast.  Ovarian.  Tubal.  Peritoneal cancers.  Results of the assessment will determine the need for genetic counseling and BRCA1 and BRCA2 testing. Cervical Cancer Your health care provider may recommend that you be screened regularly for cancer of the pelvic organs (ovaries, uterus, and  vagina). This screening involves a pelvic examination, including checking for microscopic changes to the surface of your cervix (Pap test). You may be encouraged to have this screening done every 3 years, beginning at age 21.  For women ages 30-65, health care providers may recommend pelvic exams and Pap testing every 3 years, or they may recommend the Pap and pelvic exam, combined with testing for human papilloma virus (HPV), every 5 years. Some types of HPV increase your risk of cervical cancer. Testing for HPV may also be done on women of any age with unclear Pap test results.  Other health care providers may not recommend any screening for nonpregnant women who are considered low risk for pelvic cancer and who do not have symptoms. Ask your health care provider if a screening pelvic exam is right for you.  If you have had past treatment for cervical cancer or a condition that could lead to cancer, you need Pap tests and screening for cancer for at least 20 years after your treatment. If Pap tests have been discontinued, your risk factors (such as having a new sexual partner) need to be reassessed to determine if screening should resume. Some women have medical problems that increase the chance of getting cervical cancer. In these cases, your health care provider may recommend more frequent screening and Pap tests. Colorectal Cancer  This type of cancer can be detected and often prevented.  Routine colorectal cancer screening usually begins at 64 years of age and continues through 64 years of age.  Your health care provider may recommend screening at an earlier age if you have risk factors for colon cancer.  Your health care provider may also recommend using home test kits to check for hidden blood in the stool.  A small camera at the end of a tube can be used to examine your colon directly (sigmoidoscopy or colonoscopy). This is done to check for the earliest forms of colorectal  cancer.  Routine screening usually begins at age 50.  Direct examination of the colon should be repeated every 5-10 years through 64 years of age. However, you may need to be screened more often if early forms of precancerous polyps or small growths are found. Skin Cancer  Check your skin from head to toe regularly.  Tell your health care provider about any new moles or changes in moles, especially if there is a change in a mole's shape or color.  Also tell your health care provider if you have a mole that is larger than the size of a pencil eraser.  Always use sunscreen. Apply sunscreen liberally and repeatedly throughout the day.  Protect yourself by wearing long sleeves, pants, a wide-brimmed hat, and sunglasses whenever you are outside. HEART DISEASE, DIABETES, AND HIGH BLOOD PRESSURE   High blood pressure causes heart disease and increases the risk of stroke. High blood pressure is more likely to develop in:  People who have blood pressure in the high end   of the normal range (130-139/85-89 mm Hg).  People who are overweight or obese.  People who are African American.  If you are 38-23 years of age, have your blood pressure checked every 3-5 years. If you are 61 years of age or older, have your blood pressure checked every year. You should have your blood pressure measured twice--once when you are at a hospital or clinic, and once when you are not at a hospital or clinic. Record the average of the two measurements. To check your blood pressure when you are not at a hospital or clinic, you can use:  An automated blood pressure machine at a pharmacy.  A home blood pressure monitor.  If you are between 45 years and 39 years old, ask your health care provider if you should take aspirin to prevent strokes.  Have regular diabetes screenings. This involves taking a blood sample to check your fasting blood sugar level.  If you are at a normal weight and have a low risk for diabetes,  have this test once every three years after 64 years of age.  If you are overweight and have a high risk for diabetes, consider being tested at a younger age or more often. PREVENTING INFECTION  Hepatitis B  If you have a higher risk for hepatitis B, you should be screened for this virus. You are considered at high risk for hepatitis B if:  You were born in a country where hepatitis B is common. Ask your health care provider which countries are considered high risk.  Your parents were born in a high-risk country, and you have not been immunized against hepatitis B (hepatitis B vaccine).  You have HIV or AIDS.  You use needles to inject street drugs.  You live with someone who has hepatitis B.  You have had sex with someone who has hepatitis B.  You get hemodialysis treatment.  You take certain medicines for conditions, including cancer, organ transplantation, and autoimmune conditions. Hepatitis C  Blood testing is recommended for:  Everyone born from [birth year] through 1964.  Anyone with known risk factors for hepatitis C. Sexually transmitted infections (STIs)  You should be screened for sexually transmitted infections (STIs) including gonorrhea and chlamydia if:  You are sexually active and are younger than 64 years of age.  You are older than 64 years of age and your health care provider tells you that you are at risk for this type of infection.  Your sexual activity has changed since you were last screened and you are at an increased risk for chlamydia or gonorrhea. Ask your health care provider if you are at risk.  If you do not have HIV, but are at risk, it may be recommended that you take a prescription medicine daily to prevent HIV infection. This is called pre-exposure prophylaxis (PrEP). You are considered at risk if:  You are sexually active and do not regularly use condoms or know the HIV status of your partner(s).  You take drugs by injection.  You are sexually  active with a partner who has HIV. Talk with your health care provider about whether you are at high risk of being infected with HIV. If you choose to begin PrEP, you should first be tested for HIV. You should then be tested every 3 months for as long as you are taking PrEP.  PREGNANCY   If you are premenopausal and you may become pregnant, ask your health care provider about preconception counseling.  If you may  become pregnant, take 400 to 800 micrograms (mcg) of folic acid every day.  If you want to prevent pregnancy, talk to your health care provider about birth control (contraception). OSTEOPOROSIS AND MENOPAUSE   Osteoporosis is a disease in which the bones lose minerals and strength with aging. This can result in serious bone fractures. Your risk for osteoporosis can be identified using a bone density scan.  If you are 61 years of age or older, or if you are at risk for osteoporosis and fractures, ask your health care provider if you should be screened.  Ask your health care provider whether you should take a calcium or vitamin D supplement to lower your risk for osteoporosis.  Menopause may have certain physical symptoms and risks.  Hormone replacement therapy may reduce some of these symptoms and risks. Talk to your health care provider about whether hormone replacement therapy is right for you.  HOME CARE INSTRUCTIONS   Schedule regular health, dental, and eye exams.  Stay current with your immunizations.   Do not use any tobacco products including cigarettes, chewing tobacco, or electronic cigarettes.  If you are pregnant, do not drink alcohol.  If you are breastfeeding, limit how much and how often you drink alcohol.  Limit alcohol intake to no more than 1 drink per day for nonpregnant women. One drink equals 12 ounces of beer, 5 ounces of wine, or 1 ounces of hard liquor.  Do not use street drugs.  Do not share needles.  Ask your health care provider for help if  you need support or information about quitting drugs.  Tell your health care provider if you often feel depressed.  Tell your health care provider if you have ever been abused or do not feel safe at home.   This information is not intended to replace advice given to you by your health care provider. Make sure you discuss any questions you have with your health care provider.   Document Released: 08/19/2010 Document Revised: 02/24/2014 Document Reviewed: 01/05/2013 Elsevier Interactive Patient Education [employer].

## 2015-07-02 NOTE — Progress Notes (Signed)
Subjective:    Patient ID: Leah Ferrell, female    DOB: [birth year], 65 y.o.   MRN: [id number]  Pt presents to the office today for chronic follow up.  Hypertension This is a chronic problem. The current episode started more than 1 year ago. The problem has been resolved since onset. The problem is controlled. Associated symptoms include malaise/fatigue. Pertinent negatives include no blurred vision, headaches, palpitations, peripheral edema or shortness of breath. Risk factors for coronary artery disease include diabetes mellitus, dyslipidemia, family history, post-menopausal state and obesity. Past treatments include beta blockers, calcium channel blockers and ACE inhibitors. The current treatment provides moderate improvement. Hypertensive end-organ damage includes CAD/MI. There is no history of CVA, heart failure or a thyroid problem.  Diabetes She presents for her follow-up diabetic visit. She has type 2 diabetes mellitus. Her disease course has been stable. There are no hypoglycemic associated symptoms. Pertinent negatives for hypoglycemia include no headaches. Associated symptoms include fatigue and foot paresthesias. Pertinent negatives for diabetes include no blurred vision, no foot ulcerations and no visual change. There are no hypoglycemic complications. Diabetic complications include heart disease and peripheral neuropathy. Pertinent negatives for diabetic complications include no CVA or nephropathy. Risk factors for coronary artery disease include dyslipidemia, diabetes mellitus, hypertension, obesity and post-menopausal. Current diabetic treatment includes insulin injections and oral agent (dual therapy). She is compliant with treatment all of the time. Her weight is stable. She is following a generally healthy diet. Her breakfast blood glucose range is generally 130-140 mg/dl. An ACE inhibitor/angiotensin II receptor blocker is being taken. She sees a podiatrist.Eye exam is current.    Hyperlipidemia This is a chronic problem. The current episode started more than 1 year ago. The problem is controlled. Recent lipid tests were reviewed and are normal. Exacerbating diseases include diabetes and obesity. She has no history of hypothyroidism. Factors aggravating her hyperlipidemia include beta blockers. Associated symptoms include leg pain. Pertinent negatives include no shortness of breath. Current antihyperlipidemic treatment includes statins. The current treatment provides moderate improvement of lipids. Compliance problems include adherence to diet and medication cost.  Risk factors for coronary artery disease include diabetes mellitus, dyslipidemia, family history, hypertension, obesity and post-menopausal.      Review of Systems  Constitutional: Positive for malaise/fatigue and fatigue.  HENT: Negative.   Eyes: Negative.  Negative for blurred vision.  Respiratory: Negative.  Negative for shortness of breath.   Cardiovascular: Negative for palpitations.  Gastrointestinal: Negative.   Endocrine: Negative.   Genitourinary: Negative.   Musculoskeletal: Negative.   Neurological: Negative.  Negative for headaches.  Hematological: Negative.   Psychiatric/Behavioral: Negative.   All other systems reviewed and are negative.      Objective:   Physical Exam  Constitutional: She is oriented to person, place, and time. She appears well-developed and well-nourished. No distress.  HENT:  Head: Normocephalic and atraumatic.  Right Ear: External ear normal.  Left Ear: External ear normal.  Nose: Nose normal.  Mouth/Throat: Oropharynx is clear and moist.  Eyes: Pupils are equal, round, and reactive to light.  Neck: Normal range of motion. Neck supple. No thyromegaly present.  Cardiovascular: Normal rate, regular rhythm, normal heart sounds and intact distal pulses.   No murmur heard. Pulmonary/Chest: Effort normal and breath sounds normal. No respiratory distress. She has no  wheezes.  Abdominal: Soft. Bowel sounds are normal. There is no tenderness.  Musculoskeletal: Normal range of motion. She exhibits no edema or tenderness.  Neurological: She is alert and oriented to person,  place, and time. She has normal reflexes. No cranial nerve deficit.  Skin: Skin is warm and dry.  Psychiatric: She has a normal mood and affect. Her behavior is normal. Judgment and thought content normal.  Vitals reviewed.   BP 95/44 mmHg  Pulse 50  Temp(Src) 97.1 F (36.2 C) (Oral)  Ht 5' 2"  (1.575 m)  Wt 210 lb (95.255 kg)  BMI 38.40 kg/m2  LMP  (Approximate)        Assessment & Plan:  1. Hyperlipidemia - CMP14+EGFR - Lipid panel  2. Hyperkalemia - CMP14+EGFR  3. Vaginal atrophy - CMP14+EGFR  4. PVD (peripheral vascular disease) (HCC) - CMP14+EGFR  5. NSTEMI (non-ST elevated myocardial infarction) (HCC) - CMP14+EGFR  6. Essential hypertension - CMP14+EGFR  7. Diabetes mellitus with peripheral vascular disease (HCC) - Bayer DCA Hb A1c Waived - CMP14+EGFR  8. Chronic diastolic heart failure (HCC) - CMP14+EGFR  9. Coronary artery disease involving native coronary artery of native heart without angina pectoris  - CMP14+EGFR  10. Colon cancer screening - CMP14+EGFR - Fecal occult blood, imunochemical; Future  11. Post-menopausal  - DG Bone Density; Future   Continue all meds Labs pending Health Maintenance reviewed Diet and exercise encouraged RTO 3 month  Evelina Dun, FNP

## 2015-07-03 ENCOUNTER — Other Ambulatory Visit: Payer: Self-pay | Admitting: Family

## 2015-07-03 MED ORDER — DULAGLUTIDE 0.75 MG/0.5ML ~~LOC~~ SOAJ: 0.7500 mg | SUBCUTANEOUS | Status: DC

## 2015-07-03 MED ORDER — CRESTOR 20 MG PO TABS: 20.0000 mg | ORAL_TABLET | Freq: Every day | ORAL | Status: DC

## 2015-07-03 NOTE — Telephone Encounter (Signed)
Patient aware that Cholesterol medication has been sent to the pharmacy.

## 2015-07-03 NOTE — Telephone Encounter (Signed)
Prescription sent to pharmacy.

## 2015-07-04 ENCOUNTER — Other Ambulatory Visit: Payer: Self-pay

## 2015-07-04 MED ORDER — ROSUVASTATIN CALCIUM 20 MG PO TABS: 20.0000 mg | ORAL_TABLET | Freq: Every day | ORAL | Status: DC

## 2015-07-13 ENCOUNTER — Telehealth: Payer: Self-pay | Admitting: Family

## 2015-07-13 NOTE — Telephone Encounter (Signed)
Patient aware that sample is ready for pick up

## 2015-08-20 ENCOUNTER — Other Ambulatory Visit: Payer: Self-pay | Admitting: Family

## 2015-09-03 ENCOUNTER — Other Ambulatory Visit: Payer: Self-pay | Admitting: Family

## 2015-09-10 ENCOUNTER — Other Ambulatory Visit: Payer: Self-pay | Admitting: Family

## 2015-09-13 ENCOUNTER — Other Ambulatory Visit: Payer: Self-pay | Admitting: Family

## 2015-09-13 DIAGNOSIS — E875 Hyperkalemia: Secondary | ICD-10-CM

## 2015-09-13 NOTE — Telephone Encounter (Signed)
I do not see on patients medication list please review 

## 2015-09-19 ENCOUNTER — Other Ambulatory Visit: Payer: Self-pay | Admitting: Cardiovascular Disease

## 2015-09-19 ENCOUNTER — Other Ambulatory Visit: Payer: Self-pay | Admitting: Family

## 2015-09-19 NOTE — Telephone Encounter (Signed)
Please review for refill. Thanks!  

## 2015-09-21 ENCOUNTER — Other Ambulatory Visit: Payer: Self-pay | Admitting: Family

## 2015-10-03 LAB — HM DIABETES EYE EXAM

## 2015-10-04 ENCOUNTER — Ambulatory Visit (INDEPENDENT_AMBULATORY_CARE_PROVIDER_SITE_OTHER): Payer: 59 | Admitting: Family

## 2015-10-04 ENCOUNTER — Encounter: Payer: Self-pay | Admitting: Family

## 2015-10-04 VITALS — BP 146/69 | HR 53 | Temp 97.7°F | Ht 62.0 in | Wt 210.4 lb

## 2015-10-04 DIAGNOSIS — E875 Hyperkalemia: Secondary | ICD-10-CM | POA: Diagnosis not present

## 2015-10-04 DIAGNOSIS — I2510 Atherosclerotic heart disease of native coronary artery without angina pectoris: Secondary | ICD-10-CM | POA: Diagnosis not present

## 2015-10-04 DIAGNOSIS — E785 Hyperlipidemia, unspecified: Secondary | ICD-10-CM

## 2015-10-04 DIAGNOSIS — I252 Old myocardial infarction: Secondary | ICD-10-CM | POA: Diagnosis not present

## 2015-10-04 DIAGNOSIS — Z114 Encounter for screening for human immunodeficiency virus [HIV]: Secondary | ICD-10-CM

## 2015-10-04 DIAGNOSIS — E1151 Type 2 diabetes mellitus with diabetic peripheral angiopathy without gangrene: Secondary | ICD-10-CM

## 2015-10-04 DIAGNOSIS — N939 Abnormal uterine and vaginal bleeding, unspecified: Secondary | ICD-10-CM | POA: Diagnosis not present

## 2015-10-04 DIAGNOSIS — I5031 Acute diastolic (congestive) heart failure: Secondary | ICD-10-CM | POA: Diagnosis not present

## 2015-10-04 DIAGNOSIS — N952 Postmenopausal atrophic vaginitis: Secondary | ICD-10-CM | POA: Diagnosis not present

## 2015-10-04 DIAGNOSIS — I10 Essential (primary) hypertension: Secondary | ICD-10-CM

## 2015-10-04 DIAGNOSIS — I739 Peripheral vascular disease, unspecified: Secondary | ICD-10-CM

## 2015-10-04 DIAGNOSIS — Z1211 Encounter for screening for malignant neoplasm of colon: Secondary | ICD-10-CM | POA: Diagnosis not present

## 2015-10-04 LAB — BAYER DCA HB A1C WAIVED: HB A1C (BAYER DCA - WAIVED): 7.9 % — ABNORMAL HIGH (ref <7.0)

## 2015-10-04 NOTE — Progress Notes (Signed)
Subjective:    Patient ID: Leah Ferrell, female    DOB: [birth year], 65 y.o.   MRN: [id number]  Pt presents to the office today for chronic follow up. Pt has CHF, CAD and hx MI and is followed by Cardiologists annually. PT is followed by Vascular every 6 months for PVD and PAD. PT states these are stable and doing well.  Diabetes  She presents for her follow-up diabetic visit. She has type 2 diabetes mellitus. Her disease course has been stable. There are no hypoglycemic associated symptoms. Pertinent negatives for hypoglycemia include no headaches. Associated symptoms include fatigue and foot paresthesias. Pertinent negatives for diabetes include no blurred vision, no foot ulcerations and no visual change. There are no hypoglycemic complications. Diabetic complications include heart disease, peripheral neuropathy and PVD. Pertinent negatives for diabetic complications include no CVA or nephropathy. Risk factors for coronary artery disease include dyslipidemia, diabetes mellitus, hypertension, obesity and post-menopausal. Current diabetic treatment includes insulin injections and oral agent (dual therapy). She is compliant with treatment all of the time. Her weight is stable. She is following a generally healthy diet. Her breakfast blood glucose range is generally 130-140 mg/dl. An ACE inhibitor/angiotensin II receptor blocker is being taken. She sees a podiatrist.Eye exam is current.  Hypertension  This is a chronic problem. The current episode started more than 1 year ago. The problem has been waxing and waning since onset. The problem is controlled. Associated symptoms include malaise/fatigue and peripheral edema ("if I stand too long"). Pertinent negatives include no blurred vision, headaches, palpitations or shortness of breath. Risk factors for coronary artery disease include diabetes mellitus, dyslipidemia, family history, post-menopausal state and obesity. Past treatments include beta blockers,  calcium channel blockers and ACE inhibitors. The current treatment provides moderate improvement. Hypertensive end-organ damage includes CAD/MI and PVD. There is no history of CVA, heart failure or a thyroid problem.  Hyperlipidemia  This is a chronic problem. The current episode started more than 1 year ago. The problem is controlled. Recent lipid tests were reviewed and are normal. Exacerbating diseases include diabetes and obesity. She has no history of hypothyroidism. Factors aggravating her hyperlipidemia include beta blockers. Associated symptoms include leg pain. Pertinent negatives include no shortness of breath. Current antihyperlipidemic treatment includes statins. The current treatment provides moderate improvement of lipids. Compliance problems include adherence to diet and medication cost.  Risk factors for coronary artery disease include diabetes mellitus, dyslipidemia, family history, hypertension, obesity and post-menopausal.  Vaginal Atrophy PT states she is bleeding/spotting every couple of weeks. Pt had negative Pap in 05/02/2014. We will referr to GYN since this continues.     Review of Systems  Constitutional: Positive for fatigue and malaise/fatigue.  HENT: Negative.   Eyes: Negative.  Negative for blurred vision.  Respiratory: Negative.  Negative for shortness of breath.   Cardiovascular: Negative for palpitations.  Gastrointestinal: Negative.   Endocrine: Negative.   Genitourinary: Negative.   Musculoskeletal: Negative.   Neurological: Negative.  Negative for headaches.  Hematological: Negative.   Psychiatric/Behavioral: Negative.   All other systems reviewed and are negative.      Objective:   Physical Exam  Constitutional: She is oriented to person, place, and time. She appears well-developed and well-nourished. No distress.  HENT:  Head: Normocephalic and atraumatic.  Right Ear: External ear normal.  Left Ear: External ear normal.  Nose: Nose normal.    Mouth/Throat: Oropharynx is clear and moist.  Eyes: Pupils are equal, round, and reactive to light.  Neck: Normal  range of motion. Neck supple. No thyromegaly present.  Cardiovascular: Normal rate, regular rhythm, normal heart sounds and intact distal pulses.   No murmur heard. Pulmonary/Chest: Effort normal and breath sounds normal. No respiratory distress. She has no wheezes.  Abdominal: Soft. Bowel sounds are normal. There is no tenderness.  Musculoskeletal: Normal range of motion. She exhibits no edema or tenderness.  Neurological: She is alert and oriented to person, place, and time.  Skin: Skin is warm and dry.  Psychiatric: She has a normal mood and affect. Her behavior is normal. Judgment and thought content normal.  Vitals reviewed.   BP (!) 156/72   Pulse (!) 59   Temp 97.7 F (36.5 C) (Oral)   Ht 5' 2"  (1.575 m)   Wt 210 lb 6.4 oz (95.4 kg)   LMP  (Approximate)   BMI 38.48 kg/m        Assessment & Plan:  1. Acute diastolic CHF (congestive heart failure) (HCC) - CMP14+EGFR  2. Coronary artery disease involving native coronary artery of native heart without angina pectoris - CMP14+EGFR  3. Diabetes mellitus with peripheral vascular disease ([place]) - Bayer DCA Hb A1c Waived - CMP14+EGFR  4. Essential hypertension - CMP14+EGFR  5. PVD (peripheral vascular disease) (HCC) - CMP14+EGFR  6. Hyperkalemia - CMP14+EGFR  7. Hyperlipidemia - CMP14+EGFR - Lipid panel  8. Vaginal atrophy - CMP14+EGFR - Ambulatory referral to Gynecology  9. PAD (peripheral artery disease) (HCC) - CMP14+EGFR  10. Hx of non-ST elevation myocardial infarction (NSTEMI) - CMP14+EGFR  11. Abnormal uterine bleeding - CMP14+EGFR - Ambulatory referral to Gynecology  12. Encounter for screening for HIV - CMP14+EGFR - HIV antibody  13. Colon cancer screening - Fecal occult blood, imunochemical; Future   Continue all meds Labs pending Health Maintenance reviewed Diet and  exercise encouraged RTO 3 months  Evelina Dun, FNP

## 2015-10-04 NOTE — Patient Instructions (Signed)
Hypertension Hypertension, commonly called high blood pressure, is when the force of blood pumping through your arteries is too strong. Your arteries are the blood vessels that carry blood from your heart throughout your body. A blood pressure reading consists of a higher number over a lower number, such as 110/72. The higher number (systolic) is the pressure inside your arteries when your heart pumps. The lower number (diastolic) is the pressure inside your arteries when your heart relaxes. Ideally you want your blood pressure below 120/80. Hypertension forces your heart to work harder to pump blood. Your arteries may become narrow or stiff. Having untreated or uncontrolled hypertension can cause heart attack, stroke, kidney disease, and other problems. RISK FACTORS Some risk factors for high blood pressure are controllable. Others are not.  Risk factors you cannot control include:   Race. You may be at higher risk if you are African American.  Age. Risk increases with age.  Gender. Men are at higher risk than women before age 45 years. After age 65, women are at higher risk than men. Risk factors you can control include:  Not getting enough exercise or physical activity.  Being overweight.  Getting too much fat, sugar, calories, or salt in your diet.  Drinking too much alcohol. SIGNS AND SYMPTOMS Hypertension does not usually cause signs or symptoms. Extremely high blood pressure (hypertensive crisis) may cause headache, anxiety, shortness of breath, and nosebleed. DIAGNOSIS To check if you have hypertension, your health care provider will measure your blood pressure while you are seated, with your arm held at the level of your heart. It should be measured at least twice using the same arm. Certain conditions can cause a difference in blood pressure between your right and left arms. A blood pressure reading that is higher than normal on one occasion does not mean that you need treatment. If  it is not clear whether you have high blood pressure, you may be asked to return on a different day to have your blood pressure checked again. Or, you may be asked to monitor your blood pressure at home for 1 or more weeks. TREATMENT Treating high blood pressure includes making lifestyle changes and possibly taking medicine. Living a healthy lifestyle can help lower high blood pressure. You may need to change some of your habits. Lifestyle changes may include:  Following the DASH diet. This diet is high in fruits, vegetables, and whole grains. It is low in salt, red meat, and added sugars.  Keep your sodium intake below 2,300 mg per day.  Getting at least 30-45 minutes of aerobic exercise at least 4 times per week.  Losing weight if necessary.  Not smoking.  Limiting alcoholic beverages.  Learning ways to reduce stress. Your health care provider may prescribe medicine if lifestyle changes are not enough to get your blood pressure under control, and if one of the following is true:  You are 18-59 years of age and your systolic blood pressure is above 140.  You are 60 years of age or older, and your systolic blood pressure is above 150.  Your diastolic blood pressure is above 90.  You have diabetes, and your systolic blood pressure is over 140 or your diastolic blood pressure is over 90.  You have kidney disease and your blood pressure is above 140/90.  You have heart disease and your blood pressure is above 140/90. Your personal target blood pressure may vary depending on your medical conditions, your age, and other factors. HOME CARE INSTRUCTIONS    Have your blood pressure rechecked as directed by your health care provider.   Take medicines only as directed by your health care provider. Follow the directions carefully. Blood pressure medicines must be taken as prescribed. The medicine does not work as well when you skip doses. Skipping doses also puts you at risk for  problems.  Do not smoke.   Monitor your blood pressure at home as directed by your health care provider. SEEK MEDICAL CARE IF:   You think you are having a reaction to medicines taken.  You have recurrent headaches or feel dizzy.  You have swelling in your ankles.  You have trouble with your vision. SEEK IMMEDIATE MEDICAL CARE IF:  You develop a severe headache or confusion.  You have unusual weakness, numbness, or feel faint.  You have severe chest or abdominal pain.  You vomit repeatedly.  You have trouble breathing. MAKE SURE YOU:   Understand these instructions.  Will watch your condition.  Will get help right away if you are not doing well or get worse.   This information is not intended to replace advice given to you by your health care provider. Make sure you discuss any questions you have with your health care provider.   Document Released: 02/03/2005 Document Revised: 06/20/2014 Document Reviewed: 11/26/2012 Elsevier Interactive Patient Education 2016 Elsevier Inc.  

## 2015-10-05 ENCOUNTER — Other Ambulatory Visit: Payer: Self-pay | Admitting: Family

## 2015-10-05 LAB — CMP14+EGFR
ALBUMIN: 4.3 g/dL (ref 3.6–4.8)
ALT: 22 IU/L (ref 0–32)
AST: 15 IU/L (ref 0–40)
Albumin/Globulin Ratio: 1.2 (ref 1.2–2.2)
Alkaline Phosphatase: 61 IU/L (ref 39–117)
BUN / CREAT RATIO: 19 (ref 12–28)
BUN: 15 mg/dL (ref 8–27)
Bilirubin Total: 0.3 mg/dL (ref 0.0–1.2)
CALCIUM: 9.1 mg/dL (ref 8.7–10.3)
CO2: 26 mmol/L (ref 18–29)
CREATININE: 0.77 mg/dL (ref 0.57–1.00)
Chloride: 98 mmol/L (ref 96–106)
GFR calc Af Amer: 94 mL/min/{1.73_m2} (ref >59)
GFR, EST NON AFRICAN AMERICAN: 82 mL/min/{1.73_m2} (ref >59)
GLOBULIN, TOTAL: 3.6 g/dL (ref 1.5–4.5)
GLUCOSE: 130 mg/dL — AB (ref 65–99)
Potassium: 4.5 mmol/L (ref 3.5–5.2)
SODIUM: 140 mmol/L (ref 134–144)
TOTAL PROTEIN: 7.9 g/dL (ref 6.0–8.5)

## 2015-10-05 LAB — LIPID PANEL
CHOL/HDL RATIO: 2.4 ratio (ref 0.0–4.4)
Cholesterol, Total: 168 mg/dL (ref 100–199)
HDL: 69 mg/dL (ref >39)
LDL CALC: 73 mg/dL (ref 0–99)
Triglycerides: 131 mg/dL (ref 0–149)
VLDL CHOLESTEROL CAL: 26 mg/dL (ref 5–40)

## 2015-10-05 LAB — HIV ANTIBODY (ROUTINE TESTING W REFLEX): HIV Screen 4th Generation wRfx: NONREACTIVE

## 2015-10-05 MED ORDER — DULAGLUTIDE 0.75 MG/0.5ML ~~LOC~~ SOAJ: 0.7500 mg | SUBCUTANEOUS | 3 refills | Status: DC

## 2015-10-08 ENCOUNTER — Telehealth: Payer: Self-pay | Admitting: Family

## 2015-10-08 NOTE — Telephone Encounter (Signed)
Tried to call pt but she refused to speak with me. She states she will only speak with Janie.

## 2015-10-09 ENCOUNTER — Encounter (HOSPITAL_COMMUNITY): Payer: 59

## 2015-10-09 ENCOUNTER — Ambulatory Visit: Payer: 59 | Admitting: Family

## 2015-10-10 NOTE — Telephone Encounter (Signed)
Patient having eye surgery next week.  She says she will start the new insulin injection after that.   Advised patient to write down all blood sugar readings and call us with those if they drop too much.

## 2015-10-16 ENCOUNTER — Encounter: Payer: Self-pay | Admitting: Family

## 2015-10-22 ENCOUNTER — Other Ambulatory Visit: Payer: Self-pay | Admitting: Family

## 2015-10-24 ENCOUNTER — Ambulatory Visit: Payer: 59 | Admitting: Family

## 2015-10-24 ENCOUNTER — Encounter (HOSPITAL_COMMUNITY): Payer: 59

## 2015-10-24 ENCOUNTER — Ambulatory Visit (HOSPITAL_COMMUNITY)
Admission: RE | Admit: 2015-10-24 | Discharge: 2015-10-24 | Disposition: A | Discharge status: Home / Self Care | Payer: 59 | Source: Ambulatory Visit | Attending: Family | Admitting: Family

## 2015-10-24 ENCOUNTER — Other Ambulatory Visit (HOSPITAL_COMMUNITY): Payer: 59

## 2015-10-24 ENCOUNTER — Ambulatory Visit (INDEPENDENT_AMBULATORY_CARE_PROVIDER_SITE_OTHER): Payer: 59 | Admitting: Family

## 2015-10-24 ENCOUNTER — Ambulatory Visit (INDEPENDENT_AMBULATORY_CARE_PROVIDER_SITE_OTHER)
Admission: RE | Admit: 2015-10-24 | Discharge: 2015-10-24 | Disposition: A | Discharge status: Home / Self Care | Payer: 59 | Source: Ambulatory Visit | Attending: Family | Admitting: Family

## 2015-10-24 ENCOUNTER — Encounter: Payer: Self-pay | Admitting: Family

## 2015-10-24 VITALS — BP 119/55 | HR 66 | Temp 97.5°F | Resp 14 | Ht 62.5 in | Wt 206.0 lb

## 2015-10-24 DIAGNOSIS — I6523 Occlusion and stenosis of bilateral carotid arteries: Secondary | ICD-10-CM

## 2015-10-24 DIAGNOSIS — I739 Peripheral vascular disease, unspecified: Secondary | ICD-10-CM | POA: Diagnosis not present

## 2015-10-24 DIAGNOSIS — Z95828 Presence of other vascular implants and grafts: Secondary | ICD-10-CM | POA: Diagnosis not present

## 2015-10-24 DIAGNOSIS — I779 Disorder of arteries and arterioles, unspecified: Secondary | ICD-10-CM | POA: Diagnosis not present

## 2015-10-24 DIAGNOSIS — E1151 Type 2 diabetes mellitus with diabetic peripheral angiopathy without gangrene: Secondary | ICD-10-CM

## 2015-10-24 DIAGNOSIS — R938 Abnormal findings on diagnostic imaging of other specified body structures: Secondary | ICD-10-CM | POA: Diagnosis not present

## 2015-10-24 DIAGNOSIS — R0989 Other specified symptoms and signs involving the circulatory and respiratory systems: Secondary | ICD-10-CM | POA: Insufficient documentation

## 2015-10-24 DIAGNOSIS — E669 Obesity, unspecified: Secondary | ICD-10-CM

## 2015-10-24 DIAGNOSIS — Z87891 Personal history of nicotine dependence: Secondary | ICD-10-CM | POA: Insufficient documentation

## 2015-10-24 LAB — VAS US CAROTID
LCCADDIAS: -10 cm/s
LCCADSYS: -58 cm/s
LEFT ECA DIAS: -7 cm/s
LICADDIAS: -13 cm/s
LICAPDIAS: -8 cm/s
LICAPSYS: -59 cm/s
Left CCA prox dias: 7 cm/s
Left CCA prox sys: 58 cm/s
Left ICA dist sys: -65 cm/s
RCCAPSYS: 66 cm/s
RIGHT CCA MID DIAS: -9 cm/s
RIGHT ECA DIAS: -11 cm/s
Right CCA prox dias: 6 cm/s
Right cca dist sys: -70 cm/s

## 2015-10-24 NOTE — Progress Notes (Signed)
[facility]   CC: Follow up peripheral artery occlusive disease  History of Present Illness Leah Ferrell is a 64 y.o. female patient of Dr. Kellie Simmering who is s/p interposition graft replacement of her left common femoral artery from the external iliac to the profunda on 11-02-13. 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  She has some numbness in the medial aspect of her left thigh. She returns today for follow up. She was initially referred by Dr. Fletcher [name] for evaluation of severe claudication left leg. Patient had angiography which revealed a left common iliac stenosis which was treated with stenting. 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 She also has occlusion of the mid left SFA. She has a patent popliteal artery with three-vessel off. She was only able to ambulate 50 feet before stopping prior to revascuarization. She had to use a cart to shop. She has no rest pain or history of nonhealing ulcers infection or gangrene. She does have diabetes mellitus. She quit smoking in 2005. She had a cardiac stent placed and is on Plavix.  She walks for 10 minutes, 3 x/day on a treadmill, indicates mild claudication in both calves after 10 minutes, relieved by rest. She denies non healing wounds.  She has a speech inflection and it is difficult to understand some of what she is saying.  She is seeing an endocrinologist for thyroid nodules, had a bx per pt.; states she will be getting yearly [place] of her thyroid.  Pt denies any history of stroke or TIA.   The patient reports New Medical or Surgical History: none  Pt Diabetic: Yes, diagnosed with DM about 2000; Review of records: A1C was 8.1 in January 2017, 7.5 in May 2017 Pt smoker: former smoker, from 1966-2005  Pt meds include: Statin :Yes Betablocker: Yes ASA: Yes Other anticoagulants/antiplatelets: Plavix    Past Medical History:   Diagnosis Date  . CAD (coronary artery disease)    a. 04/2012 NSTEMI: s/p DES to LAD.  Marland Kitchen Chronic diastolic CHF (congestive heart failure) ([place])   . Diabetes mellitus without complication ([place])   . Dysrhythmia   . Environmental and seasonal allergies   . Hyperlipidemia   . Hypertension   . Leukocytosis   . Morbid obesity ([place])   . NSTEMI (non-ST elevated myocardial infarction) ([place]) 04/27/2012  . PONV (postoperative nausea and vomiting)   . PVD (peripheral vascular disease) ([place])    a. 04/2012 ABI: R 0.49, L 0.39. Angiography 06/14: Significant ostial left common iliac artery stenosis extending into the distal aorta, severe left common femoral artery stenosis, bilateral SFA occlusion with heavy calcifications. Functionally, one-vessel runoff bilaterally below the knee  . Shortness of breath     Social History Social History  Substance Use Topics  . Smoking status: Former Smoker    Packs/day: 1.00    Years: 36.00    Types: Cigarettes    Start date: 04/21/1964    Quit date: 02/17/2001  . Smokeless tobacco: Former Systems developer    Quit date: 04/20/2001  . Alcohol use No    Family History Family History  Problem Relation Age of Onset  . Diabetes Mother   . Hyperlipidemia Mother   . Hypertension Mother   . Peripheral vascular disease Mother     Past Surgical History:  Procedure Laterality Date  . ABDOMINAL AORTAGRAM N/A 07/21/2012   Procedure: ABDOMINAL Maxcine Ham;  Surgeon: Wellington Hampshire, MD;  Location: [facility] CATH LAB;  Service: Cardiovascular;  Laterality: N/A;  . CARDIAC CATHETERIZATION     stents X 2  . CORONARY ANGIOGRAM  04/27/2012   Procedure: CORONARY ANGIOGRAM;  Surgeon: Thayer Headings, MD;  Location: [facility] CATH LAB;  Service: Cardiovascular;;  . ENDARTERECTOMY FEMORAL Left 11/02/2013   Procedure: RESECTION LEFT COMMON FEMORAL ARTERY AND INSERTION OF INTERPOSITION 63mm HEMASHIELD GRAFT FROM LEFT EXTERNAL  ILIAC ARTERY TO LEFT PROFUNDA  FEMORIS ARTERY;  Surgeon: Mal Misty, MD;   Location: [facility];  Service: Vascular;  Laterality: Left;  . FRACTURE SURGERY Right Jul 07, 2014   Right Foot-  Pt.fell  at Home  by Heat duct  . ILIAC ARTERY STENT Left 08/31/2013  . LAD stent    . LEFT HEART CATHETERIZATION WITH CORONARY ANGIOGRAM N/A 04/23/2012   Procedure: LEFT HEART CATHETERIZATION WITH CORONARY ANGIOGRAM;  Surgeon: Peter M Martinique, MD;  Location: [facility] CATH LAB;  Service: Cardiovascular;  Laterality: N/A;  . LOWER EXTREMITY ANGIOGRAM Left 08/31/2013   Procedure: LOWER EXTREMITY ANGIOGRAM;  Surgeon: Wellington Hampshire, MD;  Location: [facility] CATH LAB;  Service: Cardiovascular;  Laterality: Left;  . PERCUTANEOUS STENT INTERVENTION Left 08/31/2013   Procedure: PERCUTANEOUS STENT INTERVENTION;  Surgeon: Wellington Hampshire, MD;  Location: [facility] CATH LAB;  Service: Cardiovascular;  Laterality: Left;  Common Iliac artery  . 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 Tape Rash    Current Outpatient Prescriptions  Medication Sig Dispense Refill  . amLODipine (NORVASC) 10 MG tablet TAKE ONE TABLET BY MOUTH ONCE DAILY 30 tablet 5  . Ascorbic Acid (VITAMIN C) 500 MG CAPS Take 1 capsule by mouth daily.     Marland Kitchen aspirin EC 81 MG tablet Take 81 mg by mouth daily.     . Calcium Carbonate-Vit D-Min (CALTRATE 600+D PLUS MINERALS) 600-800 MG-UNIT TABS Take 1 tablet by mouth 2 (two) times daily.    . clopidogrel (PLAVIX) 75 MG tablet TAKE ONE TABLET BY MOUTH ONCE DAILY 90 tablet 0  . conjugated estrogens (PREMARIN) vaginal cream Place 1 Applicatorful vaginally daily. 42.5 g 3  . Dulaglutide (TRULICITY) A999333 0000000 SOPN Inject 0.75 mg into the skin once a week. 12 pen 3  . fish oil-omega-3 fatty acids 1000 MG capsule Take 1 g by mouth every morning.    . furosemide (LASIX) 40 MG tablet TAKE ONE TABLET BY MOUTH ONCE DAILY 90 tablet 0  . hydroxypropyl methylcellulose (ISOPTO TEARS) 2.5 % ophthalmic solution Place 1 drop into both eyes 3 (three) times daily as needed (for  dry eyes).    . Hypromell-Glycerin-Naphazoline 0.8-0.25-0.012 % SOLN Apply 1 application to eye daily as needed (dry eyes).     . insulin aspart protamine - aspart (NOVOLOG MIX 70/30 FLEXPEN) (70-30) 100 UNIT/ML FlexPen Use 75 unit TID with meals 25 pen 6  . INVOKANA 300 MG TABS tablet TAKE ONE TABLET BY MOUTH ONCE DAILY IN THE MORNING BEFORE  BREAKFAST 90 tablet 0  . KLOR-CON M20 20 MEQ tablet TAKE ONE TABLET BY MOUTH ONCE DAILY 90 tablet 0  . lisinopril (PRINIVIL,ZESTRIL) 20 MG tablet TAKE ONE TABLET BY MOUTH TWICE DAILY 180 tablet 0  . metoprolol succinate (TOPROL-XL) 50 MG 24 hr tablet TAKE ONE & ONE-HALF TABLETS BY MOUTH TWICE DAILY 90 tablet 1  . montelukast (SINGULAIR) 10 MG tablet TAKE ONE TABLET BY MOUTH ONCE DAILY AT BEDTIME 90 tablet 2  . Multiple Vitamin (MULTIVITAMIN WITH MINERALS) TABS Take 1 tablet by  mouth daily.    . Naftifine HCl 2 % CREA Apply to bilateral feet BID 45 g 1  . nitroGLYCERIN (NITROSTAT) 0.4 MG SL tablet Place 1 tablet (0.4 mg total) under the tongue every 5 (five) minutes x 3 doses as needed for chest pain (up to 3 doses). 25 tablet 4  . PROVENTIL HFA 108 (90 BASE) MCG/ACT inhaler INHALE ONE TO TWO PUFFS BY MOUTH EVERY 6 HOURS AS NEEDED FOR WHEEZING OR FOR SHORTNESS OF BREATH 7 g 3  . RELION PEN NEEDLES 29G X 12MM MISC USE ONE NEEDELE TO INJECT INSULIN SUBCUTANEOUSLY THREE TIMES DAILY 90 each 12  . rosuvastatin (CRESTOR) 20 MG tablet Take 1 tablet (20 mg total) by mouth daily. 90 tablet 1   No current facility-administered medications for this visit.     ROS: See HPI for pertinent positives and negatives.   Physical Examination  Vitals:   10/24/15 1414 10/24/15 1418  BP: (!) 110/54 (!) 119/55  Pulse: 66 66  Resp: 14   Temp: 97.5 F (36.4 C)   SpO2: 99%   Weight: 206 lb (93.4 kg)   Height: 5' 2.5" (1.588 m)    Body mass index is 37.08 kg/m.  General: A&O x 3, WDWN, obese female, central adiposity. Gait: normal Eyes: PERRLA. Pulmonary:  Respirations are non labored, CTAB, without wheezes, rales or rhonchi. Cardiac: regular rhythm, no detected murmur.     Carotid Bruits Right Left   positive Negative  Aorta is not palpable. Radial pulses: 2+ palpable and =   VASCULAR EXAM: Extremities without ischemic changes, without Gangrene; without open wounds.     LE Pulses Right Left   FEMORAL not palpable(obese) not palpable (obese)    POPLITEAL not palpable  not palpable   POSTERIOR TIBIAL not palpable  not palpable    DORSALIS PEDIS  ANTERIOR TIBIAL not palpable  not palpable    Abdomen: softly obese, NT, no palpable masses. Skin: no rashes, no ulcers. Musculoskeletal: no muscle wasting or atrophy. Neurologic: A&O X 3; Appropriate Affect ; SENSATION: normal; MOTOR FUNCTION: moving all extremities equally, motor strength 5/5 throughout. Speech is fluent. CN 2-12 intact. Speech is difficult to understand at times, pt is talkative.     ASSESSMENT: Leah Ferrell is a 65 y.o. female who is s/p interposition graft replacement of her left common femoral artery from the external iliac to the profunda on 11-02-13. 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 Angiography also revealed a left common iliac stenosis which was treated with stenting.  She has mild bilateral calf claudication after walking about 10 minutes on her treadmill, relieved by rest. She has no signs of ischemia in her feet/legs.  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   DATA Today's left LE arterial duplex suggests >70% stenosis at the proximal anastomosis.  Known left superficial femoral artery occlusive disease.  Significant stenosis today that was  not noted on duplex exam of 04/24/2015. Bilateral ABI's remain stable with moderate arterial occlusive disease and all monophasic waveforms.   Carotid duplex suggested minimal bilateral ICA stenosis, unchanged from exam of 10/09/14.   Her atherosclerotic risk factors include almost in control DM, former smoker, CAD, and central adiposity. She seems to be more successful at implementing healthier lifestyle measures.    PLAN:  Continue graduated walking program.  Based on the patient's vascular studies and examination, and after discussing with Dr. Donnetta Hutching, pt will return to clinic in 6 months with  ABI's and left LE arterial duplex, follow up with Dr. Donzetta Matters. Carotid duplex in 2 years.   I discussed in depth with the patient the nature of atherosclerosis, and emphasized the importance of maximal medical management including strict control of blood pressure, blood glucose, and lipid levels, obtaining regular exercise, and continued cessation of smoking.  The patient is aware that without maximal medical management the underlying atherosclerotic disease process will progress, limiting the benefit of any interventions.  The patient was given information about PAD including signs, symptoms, treatment, what symptoms should prompt the patient to seek immediate medical care, and risk reduction measures to take.  Clemon Chambers, RN, MSN, FNP-C [facility] Phone: [phone]  Clinic MD: Early  10/24/15 2:30 PM

## 2015-10-24 NOTE — Patient Instructions (Signed)
Peripheral Vascular Disease Peripheral vascular disease (PVD) is a disease of the blood vessels that are not part of your heart and brain. A simple term for PVD is poor circulation. In most cases, PVD narrows the blood vessels that carry blood from your heart to the rest of your body. This can result in a decreased supply of blood to your arms, legs, and internal organs, like your stomach or kidneys. However, it most often affects a person's lower legs and feet. There are two types of PVD.  Organic PVD. This is the more common type. It is caused by damage to the structure of blood vessels.  Functional PVD. This is caused by conditions that make blood vessels contract and tighten (spasm). Without treatment, PVD tends to get worse over time. PVD can also lead to acute ischemic limb. This is when an arm or limb suddenly has trouble getting enough blood. This is a medical emergency. CAUSES Each type of PVD has many different causes. The most common cause of PVD is buildup of a fatty material (plaque) inside of your arteries (atherosclerosis). Small amounts of plaque can break off from the walls of the blood vessels and become lodged in a smaller artery. This blocks blood flow and can cause acute ischemic limb. Other common causes of PVD include:  Blood clots that form inside of blood vessels.  Injuries to blood vessels.  Diseases that cause inflammation of blood vessels or cause blood vessel spasms.  Health behaviors and health history that increase your risk of developing PVD. RISK FACTORS  You may have a greater risk of PVD if you:  Have a family history of PVD.  Have certain medical conditions, including:  High cholesterol.  Diabetes.  High blood pressure (hypertension).  Coronary heart disease.  Past problems with blood clots.  Past injury, such as burns or a broken bone. These may have damaged blood vessels in your limbs.  Buerger disease. This is caused by inflamed blood  vessels in your hands and feet.  Some forms of arthritis.  Rare birth defects that affect the arteries in your legs.  Use tobacco.  Do not get enough exercise.  Are obese.  Are age 50 or older. SIGNS AND SYMPTOMS  PVD may cause many different symptoms. Your symptoms depend on what part of your body is not getting enough blood. Some common signs and symptoms include:  Cramps in your lower legs. This may be a symptom of poor leg circulation (claudication).  Pain and weakness in your legs while you are physically active that goes away when you rest (intermittent claudication).  Leg pain when at rest.  Leg numbness, tingling, or weakness.  Coldness in a leg or foot, especially when compared with the other leg.  Skin or hair changes. These can include:  Hair loss.  Shiny skin.  Pale or bluish skin.  Thick toenails.  Inability to get or maintain an erection (erectile dysfunction). People with PVD are more prone to developing ulcers and sores on their toes, feet, or legs. These may take longer than normal to heal. DIAGNOSIS Your health care provider may diagnose PVD from your signs and symptoms. The health care provider will also do a physical exam. You may have tests to find out what is causing your PVD and determine its severity. Tests may include:  Blood pressure recordings from your arms and legs and measurements of the strength of your pulses (pulse volume recordings).  Imaging studies using sound waves to take pictures of   the blood flow through your blood vessels (Doppler ultrasound).  Injecting a dye into your blood vessels before having imaging studies using:  X-rays (angiogram or arteriogram).  Computer-generated X-rays (CT angiogram).  A powerful electromagnetic field and a computer (magnetic resonance angiogram or MRA). TREATMENT Treatment for PVD depends on the cause of your condition and the severity of your symptoms. It also depends on your age. Underlying  causes need to be treated and controlled. These include long-lasting (chronic) conditions, such as diabetes, high cholesterol, and high blood pressure. You may need to first try making lifestyle changes and taking medicines. Surgery may be needed if these do not work. Lifestyle changes may include:  Quitting smoking.  Exercising regularly.  Following a low-fat, low-cholesterol diet. Medicines may include:  Blood thinners to prevent blood clots.  Medicines to improve blood flow.  Medicines to improve your blood cholesterol levels. Surgical procedures may include:  A procedure that uses an inflated balloon to open a blocked artery and improve blood flow (angioplasty).  A procedure to put in a tube (stent) to keep a blocked artery open (stent implant).  Surgery to reroute blood flow around a blocked artery (peripheral bypass surgery).  Surgery to remove dead tissue from an infected wound on the affected limb.  Amputation. This is surgical removal of the affected limb. This may be necessary in cases of acute ischemic limb that are not improved through medical or surgical treatments. HOME CARE INSTRUCTIONS  Take medicines only as directed by your health care provider.  Do not use any tobacco products, including cigarettes, chewing tobacco, or electronic cigarettes. If you need help quitting, ask your health care provider.  Lose weight if you are overweight, and maintain a healthy weight as directed by your health care provider.  Eat a diet that is low in fat and cholesterol. If you need help, ask your health care provider.  Exercise regularly. Ask your health care provider to suggest some good activities for you.  Use compression stockings or other mechanical devices as directed by your health care provider.  Take good care of your feet.  Wear comfortable shoes that fit well.  Check your feet often for any cuts or sores. SEEK MEDICAL CARE IF:  You have cramps in your legs  while walking.  You have leg pain when you are at rest.  You have coldness in a leg or foot.  Your skin changes.  You have erectile dysfunction.  You have cuts or sores on your feet that are not healing. SEEK IMMEDIATE MEDICAL CARE IF:  Your arm or leg turns cold and blue.  Your arms or legs become red, warm, swollen, painful, or numb.  You have chest pain or trouble breathing.  You suddenly have weakness in your face, arm, or leg.  You become very confused or lose the ability to speak.  You suddenly have a very bad headache or lose your vision.   This information is not intended to replace advice given to you by your health care provider. Make sure you discuss any questions you have with your health care provider.   Document Released: 03/13/2004 Document Revised: 02/24/2014 Document Reviewed: 07/14/2013 Elsevier Interactive Patient Education 2016 [employer] Inc[employer]  

## 2015-11-09 ENCOUNTER — Other Ambulatory Visit: Payer: Self-pay

## 2015-11-09 DIAGNOSIS — E1151 Type 2 diabetes mellitus with diabetic peripheral angiopathy without gangrene: Secondary | ICD-10-CM

## 2015-11-09 MED ORDER — INSULIN ASPART PROT & ASPART (70-30 MIX) 100 UNIT/ML PEN: PEN_INJECTOR | SUBCUTANEOUS | 1 refills | Status: DC

## 2015-11-12 ENCOUNTER — Encounter: Payer: Self-pay | Admitting: Adult Health

## 2015-11-12 ENCOUNTER — Ambulatory Visit (INDEPENDENT_AMBULATORY_CARE_PROVIDER_SITE_OTHER): Payer: 59 | Admitting: Adult Health

## 2015-11-12 ENCOUNTER — Other Ambulatory Visit (HOSPITAL_COMMUNITY)
Admission: RE | Admit: 2015-11-12 | Discharge: 2015-11-12 | Disposition: A | Discharge status: Home / Self Care | Payer: 59 | Source: Ambulatory Visit | Attending: Adult Health | Admitting: Adult Health

## 2015-11-12 VITALS — BP 130/50 | HR 90 | Ht 62.5 in | Wt 209.0 lb

## 2015-11-12 DIAGNOSIS — Z1212 Encounter for screening for malignant neoplasm of rectum: Secondary | ICD-10-CM | POA: Diagnosis not present

## 2015-11-12 DIAGNOSIS — Z139 Encounter for screening, unspecified: Secondary | ICD-10-CM

## 2015-11-12 DIAGNOSIS — Z1151 Encounter for screening for human papillomavirus (HPV): Secondary | ICD-10-CM | POA: Diagnosis present

## 2015-11-12 DIAGNOSIS — Z124 Encounter for screening for malignant neoplasm of cervix: Secondary | ICD-10-CM

## 2015-11-12 DIAGNOSIS — Z01419 Encounter for gynecological examination (general) (routine) without abnormal findings: Secondary | ICD-10-CM | POA: Diagnosis present

## 2015-11-12 DIAGNOSIS — N950 Postmenopausal bleeding: Secondary | ICD-10-CM

## 2015-11-12 LAB — HEMOCCULT GUIAC POC 1CARD (OFFICE): FECAL OCCULT BLD: NEGATIVE

## 2015-11-12 NOTE — Patient Instructions (Signed)
Postmenopausal Bleeding Postmenopausal bleeding is any bleeding after menopause. Menopause is when a woman's period stops. Any type of bleeding after menopause is concerning. It should be checked by your doctor. Any treatment will depend on the cause. HOME CARE Watch your condition for any changes.  Avoid the use of tampons and douches as told by your doctor.  Change your pads often.  Get regular pelvic exams and Pap tests.  Keep all appointments for tests as told by your doctor. GET HELP IF:  Your bleeding lasts for more than 1 week.  You have belly (abdominal) pain.  You have bleeding after sex (intercourse). GET HELP RIGHT AWAY IF:   You have a fever, chills, a headache, dizziness, muscle aches, and bleeding.  You have strong pain with bleeding.  You have clumps of blood (blood clots) coming from your vagina.  You have bleeding and need more than 1 pad an hour.  You feel like you are going to pass out (faint). MAKE SURE YOU:  Understand these instructions.  Will watch your condition.  Will get help right away if you are not doing well or get worse.   This information is not intended to replace advice given to you by your health care provider. Make sure you discuss any questions you have with your health care provider.   Document Released: 11/13/2007 Document Revised: 02/08/2013 Document Reviewed: 09/02/2012 Elsevier Interactive Patient Education [employer]. Return in 1 week for [place]

## 2015-11-12 NOTE — Progress Notes (Signed)
Subjective:     Patient ID: Leah Ferrell, female   DOB: 1950-03-23, 65 y.o.   MRN: [id number]  HPI Leah Ferrell is a 65 year old female, widowed in for vaginal bleeding for 4-5 months now consistently but has had on and off for about 5 years, since husband died.She is not sexually active, and denies any pain. Has not had pap in awhile she says. PCP is [place].  Review of Systems +vaginal bleeding  Reviewed past medical,surgical, social and family history. Reviewed medications and allergies.     Objective:   Physical Exam BP (!) 130/50 (BP Location: Left Arm, Patient Position: Sitting, Cuff Size: Large)   Pulse 90   Ht 5' 2.5" (1.588 m)   Wt 209 lb (94.8 kg)   LMP  (Approximate)   BMI 37.62 kg/m  Skin warm and dry. Neck: mid line trachea, normal thyroid, good ROM, no lymphadenopathy noted. Lungs: clear to ausculation bilaterally. Cardiovascular: regular rate and rhythm.Abdomen is soft and non tender, no HSM. Pelvic: external genitalia is normal in appearance no lesions, vagina: is atrophic,urethra has no lesions or masses noted, cervix:smooth, Pap with HPV performed, uterus: normal size, shape and contour, non tender, no masses felt, adnexa: no masses or tenderness noted. Bladder is non tender and no masses felt.On rectal exam has good tone, no hemorrhoids and hemoccult was negative.   PHQ 2 score is 0.  Assessment:     1. PMB (postmenopausal bleeding)   2. Screening   3. Routine cervical smear       Plan:     Pap with HPV sent Return in 1 week for GYN [place] and see me  Review handout on PMB

## 2015-11-13 ENCOUNTER — Other Ambulatory Visit: Payer: Self-pay | Admitting: Family

## 2015-11-13 LAB — CYTOLOGY - PAP

## 2015-11-19 ENCOUNTER — Other Ambulatory Visit: Payer: 59

## 2015-11-21 ENCOUNTER — Ambulatory Visit (INDEPENDENT_AMBULATORY_CARE_PROVIDER_SITE_OTHER): Payer: 59

## 2015-11-21 ENCOUNTER — Encounter: Payer: Self-pay | Admitting: Adult Health

## 2015-11-21 ENCOUNTER — Ambulatory Visit (INDEPENDENT_AMBULATORY_CARE_PROVIDER_SITE_OTHER): Payer: 59 | Admitting: Adult Health

## 2015-11-21 VITALS — BP 140/50 | HR 70 | Ht 62.5 in | Wt 208.5 lb

## 2015-11-21 DIAGNOSIS — N950 Postmenopausal bleeding: Secondary | ICD-10-CM

## 2015-11-21 DIAGNOSIS — D251 Intramural leiomyoma of uterus: Secondary | ICD-10-CM | POA: Diagnosis not present

## 2015-11-21 DIAGNOSIS — R9389 Abnormal findings on diagnostic imaging of other specified body structures: Secondary | ICD-10-CM

## 2015-11-21 DIAGNOSIS — N854 Malposition of uterus: Secondary | ICD-10-CM | POA: Diagnosis not present

## 2015-11-21 DIAGNOSIS — R938 Abnormal findings on diagnostic imaging of other specified body structures: Secondary | ICD-10-CM

## 2015-11-21 NOTE — Progress Notes (Signed)
Subjective:     Patient ID: Leah Ferrell, female   DOB: July 24, 1950, 65 y.o.   MRN: [id number]  HPI Leah Ferrell is a 65 year old female in to discuss [place], was having PMB.  Review of Systems  +PMB  Reviewed past medical,surgical, social and family history. Reviewed medications and allergies.     Objective:   Physical Exam BP (!) 140/50   Pulse 70   Ht 5' 2.5" (1.588 m)   Wt 208 lb 8 oz (94.6 kg)   LMP  (Approximate)   BMI 37.53 kg/m PHQ 2 score 0. Reviewed [place] with pt, has 15.4 mm endometrial thickness, and small fibroid.Discussed getting endometrial biopsy to assess tissue.   She is also informed that pap smear was normal.  Assessment:     1. PMB (postmenopausal bleeding)   2. Thickened endometrium       Plan:     Return in 1 week for endometrial biopsy with Dr Elonda Husky Review handout on endometrial biopsy

## 2015-11-21 NOTE — Patient Instructions (Signed)
Return in 1 week for endo biopsy

## 2015-11-21 NOTE — Progress Notes (Signed)
PELVIC US TA/TV: Heterogenous anteverted uterus w/a posterior right intramural fibroid 2.4 x 1.4 x 1.2 cm,complex thickened endometrium 15.57mm,normal right ovary,lt adnexa wnl (lt oophorectomy),no free fluid,no pain during ultrasound

## 2015-11-27 ENCOUNTER — Other Ambulatory Visit: Payer: Self-pay | Admitting: Family Medicine

## 2015-11-29 ENCOUNTER — Encounter: Payer: Self-pay | Admitting: Obstetrics & Gynecology

## 2015-11-29 ENCOUNTER — Other Ambulatory Visit: Payer: Self-pay | Admitting: Obstetrics & Gynecology

## 2015-11-29 ENCOUNTER — Ambulatory Visit (INDEPENDENT_AMBULATORY_CARE_PROVIDER_SITE_OTHER): Payer: 59 | Admitting: Obstetrics & Gynecology

## 2015-11-29 VITALS — BP 130/54 | HR 72 | Ht 62.5 in | Wt 208.0 lb

## 2015-11-29 DIAGNOSIS — N950 Postmenopausal bleeding: Secondary | ICD-10-CM

## 2015-11-29 DIAGNOSIS — N840 Polyp of corpus uteri: Secondary | ICD-10-CM | POA: Diagnosis not present

## 2015-11-29 DIAGNOSIS — R9389 Abnormal findings on diagnostic imaging of other specified body structures: Secondary | ICD-10-CM

## 2015-11-29 NOTE — Progress Notes (Signed)
Endometrial Biopsy Procedure Note  Pre-operative Diagnosis: PMB with 15 mm complex endometrium, on plavix  Post-operative Diagnosis: same  Indications: postmenopausal bleeding  Procedure Details   Urine pregnancy test was not done.  The risks (including infection, bleeding, pain, and uterine perforation) and benefits of the procedure were explained to the patient and Written informed consent was obtained.  Antibiotic prophylaxis against endocarditis was not indicated.   The patient was placed in the dorsal lithotomy position.  Bimanual exam showed the uterus to be in the neutral position.  A Graves' speculum inserted in the vagina, and the cervix prepped with povidone iodine.  Endocervical curettage with a Kevorkian curette was not performed.   A sharp tenaculum was applied to the anterior lip of the cervix for stabilization.  A sterile uterine sound was used to sound the uterus to a depth of 6cm.  A Pipelle endometrial aspirator was used to sample the endometrium.  Sample was sent for pathologic examination.  She had a prolapsed endometrial polyp that was sent seperately #2  Condition: Stable  Complications: None  Plan:  The patient was advised to call for any fever or for prolonged or severe pain or bleeding. She was advised to use OTC ibuprofen as needed for mild to moderate pain. She was advised to avoid vaginal intercourse for 48 hours or until the bleeding has completely stopped.  Attending Physician Documentation: I was present for or performed the following: endometrial biopsy

## 2015-11-29 NOTE — Addendum Note (Signed)
Addended by: Doyne Keel on: 11/29/2015 03:30 PM   Modules accepted: Orders

## 2015-12-06 ENCOUNTER — Ambulatory Visit (INDEPENDENT_AMBULATORY_CARE_PROVIDER_SITE_OTHER): Payer: 59 | Admitting: Cardiology

## 2015-12-06 ENCOUNTER — Encounter: Payer: Self-pay | Admitting: Cardiology

## 2015-12-06 VITALS — BP 161/71 | HR 72 | Ht 62.0 in | Wt 211.4 lb

## 2015-12-06 DIAGNOSIS — I739 Peripheral vascular disease, unspecified: Secondary | ICD-10-CM

## 2015-12-06 DIAGNOSIS — I10 Essential (primary) hypertension: Secondary | ICD-10-CM | POA: Diagnosis not present

## 2015-12-06 DIAGNOSIS — E782 Mixed hyperlipidemia: Secondary | ICD-10-CM | POA: Diagnosis not present

## 2015-12-06 DIAGNOSIS — I2510 Atherosclerotic heart disease of native coronary artery without angina pectoris: Secondary | ICD-10-CM

## 2015-12-06 NOTE — Patient Instructions (Signed)
Your physician wants you to follow-up in: [place] will receive a reminder letter in the mail two months in advance. If you don't receive a letter, please call our office to schedule the follow-up appointment.  Your physician recommends that you continue on your current medications as directed. Please refer to the Current Medication list given to you today.  Thank you for choosing [facility]!!

## 2015-12-06 NOTE — Progress Notes (Signed)
Clinical Summary Leah Ferrell is a 65 y.o.female seen today for follow up of the following medical problems.   1. CAD  - prior NSTEMI 04/2012 with DES to LAD  - 07/21/2013 cath at [facility] after abnormal nuclear stress test. Succesfull pci to LAD with DES.   - no recent chest pain. No signifcant SOB or DOE - compliant with meds.    2. PAD  - followed by Dr Fletcher [name] as well as Dr Kellie Simmering, recent stenting of left common iliac. Continued symptoms, referred to vascular. She received a graft from left external iliac to left profunda femoris by Dr Kellie Simmering 11/02/13.  - denies any recent leg pains   3. Chronic diastolic heart failure  - no significant LE edema. No SOB or DOE   4. HTN  - does not check regularly  - compliant with meds. Last few visits with other providers has been at goal.   5. Hyperlipidemia  - compliant with statin  09/2015 TC 168 TG 131 HDL 69 LDL 73     SH: getting ready to move.  Past Medical History:  Diagnosis Date  . CAD (coronary artery disease)    a. 04/2012 NSTEMI: s/p DES to LAD.  Marland Kitchen Chronic diastolic CHF (congestive heart failure) ([place])   . Diabetes mellitus without complication ([place])   . Dysrhythmia   . Environmental and seasonal allergies   . Hyperlipidemia   . Hypertension   . Leukocytosis   . Morbid obesity ([place])   . NSTEMI (non-ST elevated myocardial infarction) ([place]) 04/27/2012  . PONV (postoperative nausea and vomiting)   . PVD (peripheral vascular disease) ([place])    a. 04/2012 ABI: R 0.49, L 0.39. Angiography 06/14: Significant ostial left common iliac artery stenosis extending into the distal aorta, severe left common femoral artery stenosis, bilateral SFA occlusion with heavy calcifications. Functionally, one-vessel runoff bilaterally below the knee  . Shortness of breath      Allergies  Allergen Reactions  . Tape Rash     Current Outpatient Prescriptions  Medication Sig Dispense Refill  . amLODipine  (NORVASC) 10 MG tablet TAKE ONE TABLET BY MOUTH ONCE DAILY 30 tablet 2  . Ascorbic Acid (VITAMIN C) 500 MG CAPS Take 1 capsule by mouth daily.     Marland Kitchen aspirin EC 81 MG tablet Take 81 mg by mouth daily.     . Calcium Carbonate-Vit D-Min (CALTRATE 600+D PLUS MINERALS) 600-800 MG-UNIT TABS Take 1 tablet by mouth 2 (two) times daily.    . clopidogrel (PLAVIX) 75 MG tablet TAKE ONE TABLET BY MOUTH ONCE DAILY 90 tablet 0  . conjugated estrogens (PREMARIN) vaginal cream Place 1 Applicatorful vaginally daily. 42.5 g 3  . Dulaglutide (TRULICITY) [id number] 0000000 SOPN Inject 0.75 mg into the skin once a week. 12 pen 3  . fish oil-omega-3 fatty acids 1000 MG capsule Take 1 g by mouth every morning.    . furosemide (LASIX) 40 MG tablet TAKE ONE TABLET BY MOUTH ONCE DAILY 90 tablet 0  . hydroxypropyl methylcellulose (ISOPTO TEARS) 2.5 % ophthalmic solution Place 1 drop into both eyes 3 (three) times daily as needed (for dry eyes).    . Hypromell-Glycerin-Naphazoline 0.8-0.25-0.012 % SOLN Apply 1 application to eye daily as needed (dry eyes).     . insulin aspart protamine - aspart (NOVOLOG MIX 70/30 FLEXPEN) (70-30) 100 UNIT/ML FlexPen Use 75 unit TID with meals 25 pen 1  . INVOKANA 300 MG TABS tablet TAKE ONE TABLET BY MOUTH ONCE DAILY  IN THE MORNING BEFORE  BREAKFAST 90 tablet 0  . KLOR-CON M20 20 MEQ tablet TAKE ONE TABLET BY MOUTH ONCE DAILY 90 tablet 0  . lisinopril (PRINIVIL,ZESTRIL) 20 MG tablet TAKE ONE TABLET BY MOUTH TWICE DAILY 180 tablet 0  . metoprolol succinate (TOPROL-XL) 50 MG 24 hr tablet TAKE ONE & ONE-HALF TABLETS BY MOUTH TWICE DAILY 90 tablet 1  . montelukast (SINGULAIR) 10 MG tablet TAKE ONE TABLET BY MOUTH ONCE DAILY AT BEDTIME 90 tablet 2  . Multiple Vitamin (MULTIVITAMIN WITH MINERALS) TABS Take 1 tablet by mouth daily.    . Naftifine HCl 2 % CREA Apply to bilateral feet BID 45 g 1  . nitroGLYCERIN (NITROSTAT) 0.4 MG SL tablet Place 1 tablet (0.4 mg total) under the tongue every 5 (five)  minutes x 3 doses as needed for chest pain (up to 3 doses). 25 tablet 4  . PROVENTIL HFA 108 (90 BASE) MCG/ACT inhaler INHALE ONE TO TWO PUFFS BY MOUTH EVERY 6 HOURS AS NEEDED FOR WHEEZING OR FOR SHORTNESS OF BREATH 7 g 3  . RELION PEN NEEDLES 29G X 12MM MISC USE ONE NEEDELE TO INJECT INSULIN SUBCUTANEOUSLY THREE TIMES DAILY 90 each 12  . rosuvastatin (CRESTOR) 20 MG tablet Take 1 tablet (20 mg total) by mouth daily. 90 tablet 1   No current facility-administered medications for this visit.      Past Surgical History:  Procedure Laterality Date  . ABDOMINAL AORTAGRAM N/A 07/21/2012   Procedure: ABDOMINAL Maxcine Ham;  Surgeon: Wellington Hampshire, MD;  Location: [facility] CATH LAB;  Service: Cardiovascular;  Laterality: N/A;  . CARDIAC CATHETERIZATION     stents X 2  . CORONARY ANGIOGRAM  04/27/2012   Procedure: CORONARY ANGIOGRAM;  Surgeon: Thayer Headings, MD;  Location: [facility] CATH LAB;  Service: Cardiovascular;;  . ENDARTERECTOMY FEMORAL Left 11/02/2013   Procedure: RESECTION LEFT COMMON FEMORAL ARTERY AND INSERTION OF INTERPOSITION 86mm HEMASHIELD GRAFT FROM LEFT EXTERNAL  ILIAC ARTERY TO LEFT PROFUNDA  FEMORIS ARTERY;  Surgeon: Mal Misty, MD;  Location: [facility];  Service: Vascular;  Laterality: Left;  . EYE SURGERY Right 2017  . FRACTURE SURGERY Right Jul 07, 2014   Right Foot-  Pt.fell  at Home  by Heat duct  . ILIAC ARTERY STENT Left 08/31/2013  . LAD stent    . LEFT HEART CATHETERIZATION WITH CORONARY ANGIOGRAM N/A 04/23/2012   Procedure: LEFT HEART CATHETERIZATION WITH CORONARY ANGIOGRAM;  Surgeon: Peter M Martinique, MD;  Location: [facility] CATH LAB;  Service: Cardiovascular;  Laterality: N/A;  . LOWER EXTREMITY ANGIOGRAM Left 08/31/2013   Procedure: LOWER EXTREMITY ANGIOGRAM;  Surgeon: Wellington Hampshire, MD;  Location: [facility] CATH LAB;  Service: Cardiovascular;  Laterality: Left;  . PERCUTANEOUS STENT INTERVENTION Left 08/31/2013   Procedure: PERCUTANEOUS STENT INTERVENTION;  Surgeon: Wellington Hampshire, MD;   Location: [facility] CATH LAB;  Service: Cardiovascular;  Laterality: Left;  Common Iliac artery  . 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 Tape Rash      Family History  Problem Relation Age of Onset  . Diabetes Mother   . Hyperlipidemia Mother   . Hypertension Mother   . Peripheral vascular disease Mother   . Diabetes Maternal Grandmother   . Heart attack Maternal Grandfather   . Heart disease Brother   . Other Sister     drowned  . Diabetes Brother   . Alcohol abuse Brother   . Other  Sister     drowned  . Diabetes Daughter     borderline  . Hypertension Daughter   . Diabetes Son   . Hypertension Son   . Hyperlipidemia Son      Social History Ms. Puzio reports that she quit smoking about 14 years ago. Her smoking use included Cigarettes. She started smoking about 51 years ago. She has a 36.00 pack-year smoking history. She has never used smokeless tobacco. Ms. Dipaolo reports that she does not drink alcohol.   Review of Systems CONSTITUTIONAL: No weight loss, fever, chills, weakness or fatigue.  HEENT: Eyes: No visual loss, blurred vision, double vision or yellow sclerae.No hearing loss, sneezing, congestion, runny nose or sore throat.  SKIN: No rash or itching.  CARDIOVASCULAR: per hpi RESPIRATORY: No shortness of breath, cough or sputum.  GASTROINTESTINAL: No anorexia, nausea, vomiting or diarrhea. No abdominal pain or blood.  GENITOURINARY: No burning on urination, no polyuria NEUROLOGICAL: No headache, dizziness, syncope, paralysis, ataxia, numbness or tingling in the extremities. No change in bowel or bladder control.  MUSCULOSKELETAL: No muscle, back pain, joint pain or stiffness.  LYMPHATICS: No enlarged nodes. No history of splenectomy.  PSYCHIATRIC: No history of depression or anxiety.  ENDOCRINOLOGIC: No reports of sweating, cold or heat intolerance. No polyuria or polydipsia.  Marland Kitchen   Physical Examination Vitals:     12/06/15 1346  BP: (!) 161/71  Pulse: 72   Vitals:   12/06/15 1346  Weight: 211 lb 6.4 oz (95.9 kg)  Height: 5\' 2"  (1.575 m)    Gen: resting comfortably, no acute distress HEENT: no scleral icterus, pupils equal round and reactive, no palptable cervical adenopathy,  CV: RRR, no m/r/g, no jvd Resp: Clear to auscultation bilaterally GI: abdomen is soft, non-tender, non-distended, normal bowel sounds, no hepatosplenomegaly MSK: extremities are warm, no edema.  Skin: warm, no rash Neuro:  no focal deficits Psych: appropriate affect   Diagnostic Studies 04/2012 Echo: LVEF 55-60%, no WMA, mild LVH, grade I diastolic dysfunction  0000000 ABI: Right 0.57 left 0.48. Moderate left iliac disease, distal right SFA occlusion with reconstitution at the popliteal.  04/2012 Carotid US: bilateral plaque without significant stenosis.  04/2012 Cardiac Cath  PROCEDURAL FINDINGS  Hemodynamics:  AO 155/58 mean 91 mm Hg  LV 163/13 mm Hg  Coronary angiography:  Coronary dominance: right  Left mainstem: Short with shared coronary ostia for the LAD and LCX.  Left anterior descending (LAD): diffuse 30-40% proximal. 90% focal lesion after the first diagonal.  Left circumflex (LCx): <20% proximal and mid. 80% very small distal OM.  Right coronary artery (RCA): Diffuse 20% proximal and mid vessel disease. Moderate calcification.  Left ventriculography: Left ventricular systolic function is normal, LVEF is estimated at 55-65%, there is no significant mitral regurgitation  PCI Note: Following the diagnostic procedure, the decision was made to proceed with PCI. The radial sheath was upsized to a 6 [place]. Bilinta 180 mg was given orally. Weight-based bivalirudin was given for anticoagulation. Once a therapeutic ACT was achieved, a 6 [place] XB 3.0 guide catheter was inserted. A prowater coronary guidewire was used to cross the lesion. The lesion was predilated with a 2.5 mm balloon. The lesion was  then stented with a 3.0x 16 mm Promus premier stent. The stent was postdilated with a 3.25 mm noncompliant balloon. Following PCI, there was 0% residual stenosis and TIMI-3 flow. Final angiography confirmed an excellent result. The patient tolerated the procedure well. There were no immediate procedural complications. A TR band  was used for radial hemostasis. The patient was transferred to the post catheterization recovery area for further monitoring.  PCI Data:  Vessel - LAD/Segment - mid  Percent Stenosis (pre) 90%  TIMI-flow 3  Stent 3.0 x 16 mm Promus premier  Percent Stenosis (post) 0%  TIMI-flow (post) 3  Final Conclusions:  1. Single vessel obstructive CAD  2. Normal LV function.  3. Successful stenting of the mid LAD with a DES.    07/2012 Lower Extremity Angiography  Hemodynamics:  Central Aortic Pressure / Mean Aortic Pressure: 157/70  Findings:   Abdominal aorta: Normal in size with no aneurysm. There is mild atherosclerosis below the renal arteries. Distally above the iliac bifurcation there is a calcified plaque at the origin of the left external iliac causing 30% stenosis.   Left renal artery: Normal   Right renal artery: Normal   Celiac artery: Patent   Superior mesenteric artery: Patent   Right common iliac artery: Diffuse 20% disease.   Right internal iliac artery: Normal   Right external iliac artery: Normal   Right common femoral artery: Minor irregularities.   Right profunda femoral artery: Diffuse 20% disease proximally.   Right superficial femoral artery: Diffuse atherosclerosis throughout its course. There is 20% disease at the ostium. This is followed by 70% stenosis proximally. There is another 70% stenosis in the midsegment followed by an occlusion distally Reconstituting in the proximal popliteal artery. This segment is about 50 mm and heavily calcified.   Right popliteal artery: 20-30% disease proximally.   Right tibial  peroneal trunk: Normal   Right anterior tibial artery: Diffusely diseased proximally and seems to be occluded in the midsegment.   Right peroneal artery: Patent but diffusely diseased.   Right posterior tibial artery: Patent and the dominant vessel below the knee. It has minor irregularities.   Left common iliac artery: There is a 70-80 % heavily calcified lesion proximally.   Left internal iliac artery: Diffusely diseased.   Left external iliac artery: Minor irregularities.   Left common femoral artery: Heavily calcified with diffuse 90% disease extending into the ostium of the SFA.   Left profunda femoral artery: Normal.   Left superficial femoral artery: 70% ostial disease with heavy calcifications. Diffuse 40% disease proximally. The vessel is occluded in the midsegment with reconstitution distally via collaterals from the profunda.   Left popliteal artery: Minor irregularities.   Left tibial peroneal trunk: Minor irregularities.   Left anterior tibial artery: Patent. This is the dominant vessel.   Left peroneal artery: Patent but diffusely diseased.   Left posterior tibial artery: Occlude proximally. Conclusions:  1. Significant heavily calcified ostial left common iliac artery stenosis with plaque extending into the distal aorta.  2. Severe heavily calcified disease in the left common femoral artery.  3. Bilateral SFA occlusion with heavy calcifications.  4. Significant below the knee disease bilaterally as outlined above.  Recommendations:  The patient has diffuse and heavily calcified peripheral arterial disease. Revascularization options are somewhat difficult by endovascular means. There is heavily calcified disease in the left common femoral artery which is not approachable by endovascular options. Endarterectomy is the best option for this. The stenosis in the left common iliac artery can be treated with stenting but will require likely stenting the  distal aorta as well. Both SFAs are diffusely diseased, calcified with chronic occlusion. We'll attempt medical therapy and a walking program for now.    11/2012 Labs: 140 K 4.2 Cl 97 Cr 0.75 BUN 9 AST 16  ALT 21 T.bili 0.2 TC 205 TG 169 HDL 70 LDL 101  01/2013 Event Monitor  No symptoms, no arrythmias  06/2013 Lexiscan MPI  Large reversible defect in the basal anterior, basal anteroseptal, mid anterior, mid anteroseptal, apical anteior, apical segments mild to moderate.  07/21/13 Cath  Hemodynamic Measurements Pressures:  - AO 130 / 56. - LV systolic [id number] LV End-diastolic 13.  Angiographic Findings: Left main: Normal Left anterior descending: There is 70% stenosis at the proximal segment, first diagonal is intermediate in size, there is a stent in mid LAD which is patent. Left circumflex: Angiographically normal Right coronary artery: There is 30% narrowing in the mid segment. Right dominant  Left ventriculogram: Not done.   INTERVENTIONAL PROCEDURE NARRATIVE:  Intra-operative Anticoagulation: Additional dose of 2500 heparin IV  Equipment - Guide Catheters: 6 [place] EBU 3 - Guide Wires: Run-through  Procedure Details: I advanced a 3.0 x 12 balloon and predilated the lesion up to 14 atmospheres for 20 seconds and then I repeated that for another 14 atmospheres for 20 seconds. Then I advanced a Promus 3.5 x 16 drug-eluting stent and deployed it at the ostium and  proximal LAD with extreme care not to involve the ostium of left circumflex. I deployed the stent at 16 atmospheres, for 45 seconds, enlarging the stent to 3.7 mm. Post deployment angiography showed excellent results no complications. I gave the patient  Effient 60 mg, removed the catheter and used a TR band to obtain hemostasis.Marland Kitchen  Post-operative Anticoagulation: Aspirin and Effient  Complications: None  IMPRESSION: 1. Successful PCI to proximal LAD with a Promus 3.5 x 16 drug-eluting stent, postdilated to 3.7  mm. 2. Patent mid LAD stent. 3. Normal left ventricular end-diastolic pressure of 13 mm Hg. 4. Coronary angiography and percutaneous coronary intervention performed with access through the right radial artery.  PLAN: 1. Aspirin 81 mg and Effient 10 mg daily for at least one year 2. Continue medical management of coronary artery disease. 3. Due to the fact that patient has significant life style limiting Rutherford class III claudication, I will proceed with PVR and duplex study. 4. Followup with Dr. Hamilton Capri.      Assessment and Plan  1. CAD - no current symptoms, continue current medical therapy - EKG without ischemic changes  2. PAD - continue to follow with Dr Fletcher [name] and vascular  3. Chronic diastolic HF - appears euvolemic in clinic - she will continue current meds  4. HTN -at goal by repeat manual bp at 130/55 - continue current meds  5. Hyperlipidemia - she will continue high dose statin    F/u 6 months      Arnoldo Lenis, M.D.

## 2015-12-10 ENCOUNTER — Ambulatory Visit (INDEPENDENT_AMBULATORY_CARE_PROVIDER_SITE_OTHER): Payer: 59 | Admitting: Obstetrics & Gynecology

## 2015-12-10 ENCOUNTER — Encounter: Payer: Self-pay | Admitting: Obstetrics & Gynecology

## 2015-12-10 ENCOUNTER — Other Ambulatory Visit: Payer: Self-pay | Admitting: Family

## 2015-12-10 VITALS — BP 140/60 | HR 74 | Wt 211.0 lb

## 2015-12-10 DIAGNOSIS — N950 Postmenopausal bleeding: Secondary | ICD-10-CM

## 2015-12-10 DIAGNOSIS — N840 Polyp of corpus uteri: Secondary | ICD-10-CM

## 2015-12-10 DIAGNOSIS — R938 Abnormal findings on diagnostic imaging of other specified body structures: Secondary | ICD-10-CM | POA: Diagnosis not present

## 2015-12-10 DIAGNOSIS — R9389 Abnormal findings on diagnostic imaging of other specified body structures: Secondary | ICD-10-CM

## 2015-12-10 NOTE — Progress Notes (Signed)
Follow up appointment for results  Chief Complaint  Patient presents with  . Follow-up    biopsy result    Blood pressure 140/60, pulse 74, weight 211 lb (95.7 kg).  Biopsy was negative, benign endometrial polyp and endometrial biopsy was negative    MEDS ordered this encounter: No orders of the defined types were placed in this encounter.   Orders for this encounter: No orders of the defined types were placed in this encounter.   Impression: Endometrial polyp  Plan: Benign polyp  No follow up needed  Follow Up: Return if symptoms worsen or fail to improve.       Face to face time:  10 minutes  Greater than 50% of the visit time was spent in counseling and coordination of care with the patient.  The summary and outline of the counseling and care coordination is summarized in the note above.   All questions were answered.  Past Medical History:  Diagnosis Date  . CAD (coronary artery disease)    a. 04/2012 NSTEMI: s/p DES to LAD.  Marland Kitchen Chronic diastolic CHF (congestive heart failure) ([place])   . Diabetes mellitus without complication ([place])   . Dysrhythmia   . Environmental and seasonal allergies   . Hyperlipidemia   . Hypertension   . Leukocytosis   . Morbid obesity ([place])   . NSTEMI (non-ST elevated myocardial infarction) ([place]) 04/27/2012  . PONV (postoperative nausea and vomiting)   . PVD (peripheral vascular disease) ([place])    a. 04/2012 ABI: R 0.49, L 0.39. Angiography 06/14: Significant ostial left common iliac artery stenosis extending into the distal aorta, severe left common femoral artery stenosis, bilateral SFA occlusion with heavy calcifications. Functionally, one-vessel runoff bilaterally below the knee  . Shortness of breath     Past Surgical History:  Procedure Laterality Date  . ABDOMINAL AORTAGRAM N/A 07/21/2012   Procedure: ABDOMINAL Maxcine Ham;  Surgeon: Wellington Hampshire, MD;  Location: [facility] CATH LAB;  Service: Cardiovascular;  Laterality: N/A;  .  CARDIAC CATHETERIZATION     stents X 2  . CORONARY ANGIOGRAM  04/27/2012   Procedure: CORONARY ANGIOGRAM;  Surgeon: Thayer Headings, MD;  Location: [facility] CATH LAB;  Service: Cardiovascular;;  . ENDARTERECTOMY FEMORAL Left 11/02/2013   Procedure: RESECTION LEFT COMMON FEMORAL ARTERY AND INSERTION OF INTERPOSITION 8mm HEMASHIELD GRAFT FROM LEFT EXTERNAL  ILIAC ARTERY TO LEFT PROFUNDA  FEMORIS ARTERY;  Surgeon: Mal Misty, MD;  Location: [facility];  Service: Vascular;  Laterality: Left;  . EYE SURGERY Right 2017  . FRACTURE SURGERY Right Jul 07, 2014   Right Foot-  Pt.fell  at Home  by Heat duct  . ILIAC ARTERY STENT Left 08/31/2013  . LAD stent    . LEFT HEART CATHETERIZATION WITH CORONARY ANGIOGRAM N/A 04/23/2012   Procedure: LEFT HEART CATHETERIZATION WITH CORONARY ANGIOGRAM;  Surgeon: Peter M Martinique, MD;  Location: [facility] CATH LAB;  Service: Cardiovascular;  Laterality: N/A;  . LOWER EXTREMITY ANGIOGRAM Left 08/31/2013   Procedure: LOWER EXTREMITY ANGIOGRAM;  Surgeon: Wellington Hampshire, MD;  Location: [facility] CATH LAB;  Service: Cardiovascular;  Laterality: Left;  . PERCUTANEOUS STENT INTERVENTION Left 08/31/2013   Procedure: PERCUTANEOUS STENT INTERVENTION;  Surgeon: Wellington Hampshire, MD;  Location: [facility] CATH LAB;  Service: Cardiovascular;  Laterality: Left;  Common Iliac artery  . 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 Tape Rash    Social History   Social History  . Marital status: Married    Spouse name: N/A  . Number of children: N/A  . Years of education: N/A   Social History Main Topics  . Smoking status: Former Smoker    Packs/day: 1.00    Years: 36.00    Types: Cigarettes    Start date: 04/21/1964    Quit date: 02/17/2001  . Smokeless tobacco: Never Used  . Alcohol use No  . Drug use: No  . Sexual activity: No   Other  Topics Concern  . None   Social History Narrative  . None    Family History  Problem Relation Age of Onset  . Diabetes Mother   . Hyperlipidemia Mother   . Hypertension Mother   . Peripheral vascular disease Mother   . Diabetes Maternal Grandmother   . Heart attack Maternal Grandfather   . Heart disease Brother   . Other Sister     drowned  . Diabetes Brother   . Alcohol abuse Brother   . Other Sister     drowned  . Diabetes Daughter     borderline  . Hypertension Daughter   . Diabetes Son   . Hypertension Son   . Hyperlipidemia Son

## 2015-12-15 ENCOUNTER — Other Ambulatory Visit: Payer: Self-pay | Admitting: Family

## 2015-12-19 ENCOUNTER — Other Ambulatory Visit: Payer: Self-pay

## 2015-12-19 DIAGNOSIS — E1151 Type 2 diabetes mellitus with diabetic peripheral angiopathy without gangrene: Secondary | ICD-10-CM

## 2015-12-19 MED ORDER — INSULIN ASPART PROT & ASPART (70-30 MIX) 100 UNIT/ML PEN: PEN_INJECTOR | SUBCUTANEOUS | 1 refills | Status: DC

## 2015-12-24 ENCOUNTER — Other Ambulatory Visit: Payer: Self-pay | Admitting: Family

## 2015-12-24 ENCOUNTER — Other Ambulatory Visit: Payer: Self-pay | Admitting: Cardiovascular Disease

## 2016-01-02 ENCOUNTER — Telehealth: Payer: Self-pay | Admitting: Cardiovascular Disease

## 2016-01-02 NOTE — Telephone Encounter (Signed)
Pt called in stating she needs a call back.  Per pt it is hard for her to come to the office.   She is concerned about her blockages and her children are unable to bring her.

## 2016-01-02 NOTE — Telephone Encounter (Signed)
If her leg pain is worse than before, she might need angioplasty. If leg pain is stable, continue medical therapy.

## 2016-01-02 NOTE — Telephone Encounter (Signed)
Spoke with pt she states that she would like Dr Sophronia Simas to review her last visit note with Dr Harl Bowie she states that it is hard for here to come here as she is reliant on her children and them taking off work. She states that she is concerned with being 70% blocked and would like his opinion about having surgery/stents. Please advise

## 2016-01-03 NOTE — Telephone Encounter (Signed)
Spoke with pt she states that there is no increased leg pain just new LLE shin area and ankle tingling and numbness at times. But pt denies increased pain. She will continue current regimen, decide when she wants to see Dr Sophronia Simas  and discuss with her children when they can bring her to follow up with Dr Sophronia Simas.

## 2016-01-09 ENCOUNTER — Ambulatory Visit: Payer: 59 | Admitting: Family

## 2016-01-14 ENCOUNTER — Encounter: Payer: Self-pay | Admitting: Family

## 2016-01-21 ENCOUNTER — Ambulatory Visit (INDEPENDENT_AMBULATORY_CARE_PROVIDER_SITE_OTHER): Payer: 59 | Admitting: Family

## 2016-01-21 ENCOUNTER — Encounter: Payer: Self-pay | Admitting: Family

## 2016-01-21 ENCOUNTER — Ambulatory Visit (INDEPENDENT_AMBULATORY_CARE_PROVIDER_SITE_OTHER): Payer: 59

## 2016-01-21 VITALS — BP 127/53 | HR 58 | Temp 97.9°F | Ht 62.0 in | Wt 212.0 lb

## 2016-01-21 DIAGNOSIS — E1151 Type 2 diabetes mellitus with diabetic peripheral angiopathy without gangrene: Secondary | ICD-10-CM | POA: Diagnosis not present

## 2016-01-21 DIAGNOSIS — M25552 Pain in left hip: Secondary | ICD-10-CM | POA: Diagnosis not present

## 2016-01-21 DIAGNOSIS — I5031 Acute diastolic (congestive) heart failure: Secondary | ICD-10-CM

## 2016-01-21 DIAGNOSIS — I10 Essential (primary) hypertension: Secondary | ICD-10-CM

## 2016-01-21 DIAGNOSIS — I252 Old myocardial infarction: Secondary | ICD-10-CM

## 2016-01-21 DIAGNOSIS — N950 Postmenopausal bleeding: Secondary | ICD-10-CM | POA: Diagnosis not present

## 2016-01-21 DIAGNOSIS — I739 Peripheral vascular disease, unspecified: Secondary | ICD-10-CM

## 2016-01-21 DIAGNOSIS — E782 Mixed hyperlipidemia: Secondary | ICD-10-CM

## 2016-01-21 DIAGNOSIS — N952 Postmenopausal atrophic vaginitis: Secondary | ICD-10-CM

## 2016-01-21 DIAGNOSIS — I2510 Atherosclerotic heart disease of native coronary artery without angina pectoris: Secondary | ICD-10-CM | POA: Diagnosis not present

## 2016-01-21 LAB — BAYER DCA HB A1C WAIVED: HB A1C (BAYER DCA - WAIVED): 7.5 % — ABNORMAL HIGH (ref <7.0)

## 2016-01-21 MED ORDER — INSULIN ASPART PROT & ASPART (70-30 MIX) 100 UNIT/ML PEN: PEN_INJECTOR | SUBCUTANEOUS | 1 refills | Status: DC

## 2016-01-21 MED ORDER — LISINOPRIL 20 MG PO TABS: 40.0000 mg | ORAL_TABLET | Freq: Every day | ORAL | 3 refills | Status: DC

## 2016-01-21 MED ORDER — MONTELUKAST SODIUM 10 MG PO TABS: 10.0000 mg | ORAL_TABLET | Freq: Every day | ORAL | 2 refills | Status: DC

## 2016-01-21 MED ORDER — AMLODIPINE BESYLATE 10 MG PO TABS: 10.0000 mg | ORAL_TABLET | Freq: Every day | ORAL | 2 refills | Status: DC

## 2016-01-21 MED ORDER — METOPROLOL SUCCINATE ER 25 MG PO TB24: 25.0000 mg | ORAL_TABLET | Freq: Every day | ORAL | 3 refills | Status: DC

## 2016-01-21 MED ORDER — METOPROLOL SUCCINATE ER 50 MG PO TB24: 50.0000 mg | ORAL_TABLET | Freq: Every day | ORAL | 1 refills | Status: DC

## 2016-01-21 MED ORDER — CLOPIDOGREL BISULFATE 75 MG PO TABS: 75.0000 mg | ORAL_TABLET | Freq: Every day | ORAL | 3 refills | Status: DC

## 2016-01-21 MED ORDER — INSULIN PEN NEEDLE 29G X 12MM MISC: 12 refills | Status: DC

## 2016-01-21 MED ORDER — ROSUVASTATIN CALCIUM 20 MG PO TABS: 20.0000 mg | ORAL_TABLET | Freq: Every day | ORAL | 3 refills | Status: DC

## 2016-01-21 NOTE — Progress Notes (Signed)
Subjective:    Patient ID: Leah Ferrell, female    DOB: 07/23/1950, 65 y.o.   MRN: [id number]  Pt presents to the office today for chronic follow up. Pt has CHF, CAD and hx MI and is followed by Cardiologists every 6 months. PT is followed by Vascular every 6 months for PVD and PAD. PT states these are stable and is scheduled for a stent placement next month.  Diabetes  She presents for her follow-up diabetic visit. She has type 2 diabetes mellitus. Her disease course has been stable. There are no hypoglycemic associated symptoms. Pertinent negatives for hypoglycemia include no headaches. Associated symptoms include fatigue and foot paresthesias. Pertinent negatives for diabetes include no blurred vision, no foot ulcerations and no visual change. There are no hypoglycemic complications. Diabetic complications include heart disease, peripheral neuropathy and PVD. Pertinent negatives for diabetic complications include no CVA or nephropathy. Risk factors for coronary artery disease include dyslipidemia, diabetes mellitus, hypertension, obesity and post-menopausal. Current diabetic treatment includes insulin injections and oral agent (dual therapy). She is compliant with treatment all of the time. Her weight is stable. She is following a generally healthy diet. Her breakfast blood glucose range is generally 110-130 mg/dl. An ACE inhibitor/angiotensin II receptor blocker is being taken. She sees a podiatrist.Eye exam is current.  Hypertension  This is a chronic problem. The current episode started more than 1 year ago. The problem has been resolved since onset. The problem is controlled. Associated symptoms include malaise/fatigue and peripheral edema ("if I stand too long"). Pertinent negatives include no blurred vision, headaches, palpitations or shortness of breath. Risk factors for coronary artery disease include diabetes mellitus, dyslipidemia, family history, post-menopausal state and obesity. Past  treatments include beta blockers, calcium channel blockers and ACE inhibitors. The current treatment provides moderate improvement. Hypertensive end-organ damage includes CAD/MI and PVD. There is no history of CVA, heart failure or a thyroid problem.  Hyperlipidemia  This is a chronic problem. The current episode started more than 1 year ago. The problem is controlled. Recent lipid tests were reviewed and are normal. Exacerbating diseases include diabetes and obesity. She has no history of hypothyroidism. Factors aggravating her hyperlipidemia include beta blockers. Associated symptoms include leg pain. Pertinent negatives include no shortness of breath. Current antihyperlipidemic treatment includes statins. The current treatment provides moderate improvement of lipids. Compliance problems include adherence to diet and medication cost.  Risk factors for coronary artery disease include diabetes mellitus, dyslipidemia, family history, hypertension, obesity and post-menopausal.  Vaginal Atrophy/PMB PT had an endometrial polyp removed that was negative.      Review of Systems  Constitutional: Positive for fatigue and malaise/fatigue.  HENT: Negative.   Eyes: Negative.  Negative for blurred vision.  Respiratory: Negative.  Negative for shortness of breath.   Cardiovascular: Negative for palpitations.  Gastrointestinal: Negative.   Endocrine: Negative.   Genitourinary: Negative.   Musculoskeletal: Negative.   Neurological: Negative.  Negative for headaches.  Hematological: Negative.   Psychiatric/Behavioral: Negative.   All other systems reviewed and are negative.      Objective:   Physical Exam  Constitutional: She is oriented to person, place, and time. She appears well-developed and well-nourished. No distress.  HENT:  Head: Normocephalic and atraumatic.  Right Ear: External ear normal.  Left Ear: External ear normal.  Nose: Nose normal.  Mouth/Throat: Oropharynx is clear and moist.    Eyes: Pupils are equal, round, and reactive to light.  Neck: Normal range of motion. Neck supple. No thyromegaly  present.  Cardiovascular: Normal rate, regular rhythm, normal heart sounds and intact distal pulses.   No murmur heard. Pulmonary/Chest: Effort normal and breath sounds normal. No respiratory distress. She has no wheezes.  Abdominal: Soft. Bowel sounds are normal. There is no tenderness.  Musculoskeletal: Normal range of motion. She exhibits no edema or tenderness.  Neurological: She is alert and oriented to person, place, and time.  Skin: Skin is warm and dry.  Psychiatric: She has a normal mood and affect. Her behavior is normal. Judgment and thought content normal.  Vitals reviewed.   BP (!) 127/53   Pulse (!) 58   Temp 97.9 F (36.6 C) (Oral)   Ht _0  (1.575 m)   Wt 212 lb (96.2 kg)   LMP  (Approximate)   BMI 38.78 kg/m        Assessment & Plan:  1. Acute diastolic CHF (congestive heart failure) (HCC) - CMP14+EGFR - clopidogrel (PLAVIX) 75 MG tablet; Take 1 tablet (75 mg total) by mouth daily.  Dispense: 90 tablet; Refill: 3  2. Coronary artery disease involving native coronary artery of native heart without angina pectoris - CMP14+EGFR - clopidogrel (PLAVIX) 75 MG tablet; Take 1 tablet (75 mg total) by mouth daily.  Dispense: 90 tablet; Refill: 3  3. Diabetes mellitus with peripheral vascular disease (HCC) - Bayer DCA Hb A1c Waived - CMP14+EGFR - insulin aspart protamine - aspart (NOVOLOG MIX 70/30 FLEXPEN) (70-30) 100 UNIT/ML FlexPen; Use 75 unit TID with meals  Dispense: 75 pen; Refill: 1 - clopidogrel (PLAVIX) 75 MG tablet; Take 1 tablet (75 mg total) by mouth daily.  Dispense: 90 tablet; Refill: 3 - Insulin Pen Needle (RELION PEN NEEDLES) 29G X 12MM MISC; Three times a day  Dispense: 1000 each; Refill: 12  4. Essential hypertension - CMP14+EGFR - amLODipine (NORVASC) 10 MG tablet; Take 1 tablet (10 mg total) by mouth daily.  Dispense: 90 tablet;  Refill: 2 - lisinopril (PRINIVIL,ZESTRIL) 20 MG tablet; Take 2 tablets (40 mg total) by mouth daily.  Dispense: 180 tablet; Refill: 3 - metoprolol succinate (TOPROL-XL) 50 MG 24 hr tablet; Take 1 tablet (50 mg total) by mouth daily. Take with or immediately following a meal. For a total dose of 75 mg  Dispense: 90 tablet; Refill: 1 - montelukast (SINGULAIR) 10 MG tablet; Take 1 tablet (10 mg total) by mouth daily with breakfast.  Dispense: 90 tablet; Refill: 2 - metoprolol succinate (TOPROL-XL) 25 MG 24 hr tablet; Take 1 tablet (25 mg total) by mouth daily. For total dose of 75 mg daily  Dispense: 90 tablet; Refill: 3  5. PVD (peripheral vascular disease) (HCC) - CMP14+EGFR - clopidogrel (PLAVIX) 75 MG tablet; Take 1 tablet (75 mg total) by mouth daily.  Dispense: 90 tablet; Refill: 3  6. Vaginal atrophy - CMP14+EGFR  7. Hx of non-ST elevation myocardial infarction (NSTEMI) - CMP14+EGFR  8. Mixed hyperlipidemia - CMP14+EGFR - Lipid panel - rosuvastatin (CRESTOR) 20 MG tablet; Take 1 tablet (20 mg total) by mouth daily.  Dispense: 90 tablet; Refill: 3  9. PMB (postmenopausal bleeding) - CMP14+EGFR  10. Left hip pain - DG HIP UNILAT W OR W/O PELVIS 2-3 VIEWS LEFT; Future   Continue all meds Labs pending Health Maintenance reviewed Diet and exercise encouraged RTO 3 months  Evelina Dun, FNP

## 2016-01-21 NOTE — Patient Instructions (Signed)
Diabetes Mellitus and Food It is important for you to manage your blood sugar (glucose) level. Your blood glucose level can be greatly affected by what you eat. Eating healthier foods in the appropriate amounts throughout the day at about the same time each day will help you control your blood glucose level. It can also help slow or prevent worsening of your diabetes mellitus. Healthy eating may even help you improve the level of your blood pressure and reach or maintain a healthy weight. General recommendations for healthful eating and cooking habits include:  Eating meals and snacks regularly. Avoid going long periods of time without eating to lose weight.  Eating a diet that consists mainly of plant-based foods, such as fruits, vegetables, nuts, legumes, and whole grains.  Using low-heat cooking methods, such as baking, instead of high-heat cooking methods, such as deep frying.  Work with your dietitian to make sure you understand how to use the Nutrition Facts information on food labels. How can food affect me? Carbohydrates Carbohydrates affect your blood glucose level more than any other type of food. Your dietitian will help you determine how many carbohydrates to eat at each meal and teach you how to count carbohydrates. Counting carbohydrates is important to keep your blood glucose at a healthy level, especially if you are using insulin or taking certain medicines for diabetes mellitus. Alcohol Alcohol can cause sudden decreases in blood glucose (hypoglycemia), especially if you use insulin or take certain medicines for diabetes mellitus. Hypoglycemia can be a life-threatening condition. Symptoms of hypoglycemia (sleepiness, dizziness, and disorientation) are similar to symptoms of having too much alcohol. If your health care provider has given you approval to drink alcohol, do so in moderation and use the following guidelines:  Women should not have more than one drink per day, and men  should not have more than two drinks per day. One drink is equal to: ? 12 oz of beer. ? 5 oz of wine. ? 1 oz of hard liquor.  Do not drink on an empty stomach.  Keep yourself hydrated. Have water, diet soda, or unsweetened iced tea.  Regular soda, juice, and other mixers might contain a lot of carbohydrates and should be counted.  What foods are not recommended? As you make food choices, it is important to remember that all foods are not the same. Some foods have fewer nutrients per serving than other foods, even though they might have the same number of calories or carbohydrates. It is difficult to get your body what it needs when you eat foods with fewer nutrients. Examples of foods that you should avoid that are high in calories and carbohydrates but low in nutrients include:  Trans fats (most processed foods list trans fats on the Nutrition Facts label).  Regular soda.  Juice.  Candy.  Sweets, such as cake, pie, doughnuts, and cookies.  Fried foods.  What foods can I eat? Eat nutrient-rich foods, which will nourish your body and keep you healthy. The food you should eat also will depend on several factors, including:  The calories you need.  The medicines you take.  Your weight.  Your blood glucose level.  Your blood pressure level.  Your cholesterol level.  You should eat a variety of foods, including:  Protein. ? Lean cuts of meat. ? Proteins low in saturated fats, such as fish, egg whites, and beans. Avoid processed meats.  Fruits and vegetables. ? Fruits and vegetables that may help control blood glucose levels, such as apples,   mangoes, and yams.  Dairy products. ? Choose fat-free or low-fat dairy products, such as milk, yogurt, and cheese.  Grains, bread, pasta, and rice. ? Choose whole grain products, such as multigrain bread, whole oats, and brown rice. These foods may help control blood pressure.  Fats. ? Foods containing healthful fats, such as  nuts, avocado, olive oil, canola oil, and fish.  Does everyone with diabetes mellitus have the same meal plan? Because every person with diabetes mellitus is different, there is not one meal plan that works for everyone. It is very important that you meet with a dietitian who will help you create a meal plan that is just right for you. This information is not intended to replace advice given to you by your health care provider. Make sure you discuss any questions you have with your health care provider. Document Released: 10/31/2004 Document Revised: 07/12/2015 Document Reviewed: 12/31/2012 Elsevier Interactive Patient Education  2017 Else[employer] Inc.  

## 2016-01-22 LAB — LIPID PANEL
Chol/HDL Ratio: 2.7 ratio (ref 0.0–4.4)
Cholesterol, Total: 173 mg/dL (ref 100–199)
HDL: 65 mg/dL
LDL Calculated: 77 mg/dL (ref 0–99)
Triglycerides: 155 mg/dL — ABNORMAL HIGH (ref 0–149)
VLDL Cholesterol Cal: 31 mg/dL (ref 5–40)

## 2016-01-22 LAB — CMP14+EGFR
A/G RATIO: 1.2 (ref 1.2–2.2)
ALK PHOS: 66 IU/L (ref 39–117)
ALT: 22 IU/L (ref 0–32)
AST: 19 IU/L (ref 0–40)
Albumin: 4.4 g/dL (ref 3.6–4.8)
BUN/Creatinine Ratio: 23 (ref 12–28)
BUN: 15 mg/dL (ref 8–27)
CO2: 31 mmol/L — ABNORMAL HIGH (ref 18–29)
Calcium: 10.3 mg/dL (ref 8.7–10.3)
Chloride: 95 mmol/L — ABNORMAL LOW (ref 96–106)
Creatinine, Ser: 0.65 mg/dL (ref 0.57–1.00)
GFR calc Af Amer: 109 mL/min/{1.73_m2} (ref >59)
GFR calc non Af Amer: 94 mL/min/{1.73_m2} (ref >59)
GLOBULIN, TOTAL: 3.6 g/dL (ref 1.5–4.5)
Glucose: 99 mg/dL (ref 65–99)
POTASSIUM: 4.6 mmol/L (ref 3.5–5.2)
SODIUM: 144 mmol/L (ref 134–144)
Total Protein: 8 g/dL (ref 6.0–8.5)

## 2016-01-30 ENCOUNTER — Other Ambulatory Visit: Payer: Self-pay | Admitting: Family

## 2016-01-30 DIAGNOSIS — E1151 Type 2 diabetes mellitus with diabetic peripheral angiopathy without gangrene: Secondary | ICD-10-CM

## 2016-01-31 ENCOUNTER — Telehealth: Payer: Self-pay | Admitting: Family

## 2016-01-31 MED ORDER — INSULIN PEN NEEDLE 29G X 12MM MISC: 12 refills | Status: DC

## 2016-01-31 NOTE — Telephone Encounter (Signed)
Pt aware of test results

## 2016-02-14 ENCOUNTER — Other Ambulatory Visit: Payer: Self-pay | Admitting: Family

## 2016-02-15 MED ORDER — FUROSEMIDE 40 MG PO TABS: 40.0000 mg | ORAL_TABLET | Freq: Every day | ORAL | 0 refills | Status: DC

## 2016-02-15 NOTE — Telephone Encounter (Signed)
done

## 2016-02-18 ENCOUNTER — Other Ambulatory Visit: Payer: Self-pay | Admitting: Family

## 2016-02-22 ENCOUNTER — Other Ambulatory Visit: Payer: Self-pay | Admitting: Family

## 2016-03-04 ENCOUNTER — Other Ambulatory Visit: Payer: Self-pay | Admitting: Family

## 2016-03-04 DIAGNOSIS — E1151 Type 2 diabetes mellitus with diabetic peripheral angiopathy without gangrene: Secondary | ICD-10-CM

## 2016-03-10 ENCOUNTER — Telehealth: Payer: Self-pay | Admitting: Family

## 2016-03-10 DIAGNOSIS — I10 Essential (primary) hypertension: Secondary | ICD-10-CM

## 2016-03-10 DIAGNOSIS — E875 Hyperkalemia: Secondary | ICD-10-CM

## 2016-03-10 MED ORDER — NITROGLYCERIN 0.4 MG SL SUBL: 0.4000 mg | SUBLINGUAL_TABLET | SUBLINGUAL | 4 refills | Status: DC | PRN

## 2016-03-10 MED ORDER — METOPROLOL SUCCINATE ER 50 MG PO TB24: 50.0000 mg | ORAL_TABLET | Freq: Every day | ORAL | 1 refills | Status: DC

## 2016-03-10 MED ORDER — ALBUTEROL SULFATE HFA 108 (90 BASE) MCG/ACT IN AERS: INHALATION_SPRAY | RESPIRATORY_TRACT | 3 refills | Status: DC

## 2016-03-10 MED ORDER — METOPROLOL SUCCINATE ER 25 MG PO TB24: 25.0000 mg | ORAL_TABLET | Freq: Every day | ORAL | 0 refills | Status: DC

## 2016-03-10 MED ORDER — POTASSIUM CHLORIDE CRYS ER 20 MEQ PO TBCR: 20.0000 meq | EXTENDED_RELEASE_TABLET | Freq: Every day | ORAL | 0 refills | Status: DC

## 2016-03-10 NOTE — Telephone Encounter (Signed)
Refills sent

## 2016-03-11 ENCOUNTER — Ambulatory Visit (INDEPENDENT_AMBULATORY_CARE_PROVIDER_SITE_OTHER): Payer: 59 | Admitting: Cardiovascular Disease

## 2016-03-11 VITALS — BP 150/50 | HR 74 | Ht 62.5 in | Wt 214.8 lb

## 2016-03-11 DIAGNOSIS — R011 Cardiac murmur, unspecified: Secondary | ICD-10-CM

## 2016-03-11 DIAGNOSIS — R0602 Shortness of breath: Secondary | ICD-10-CM

## 2016-03-11 DIAGNOSIS — I10 Essential (primary) hypertension: Secondary | ICD-10-CM | POA: Diagnosis not present

## 2016-03-11 DIAGNOSIS — I739 Peripheral vascular disease, unspecified: Secondary | ICD-10-CM | POA: Diagnosis not present

## 2016-03-11 DIAGNOSIS — E782 Mixed hyperlipidemia: Secondary | ICD-10-CM

## 2016-03-11 LAB — BASIC METABOLIC PANEL
BUN: 15 mg/dL (ref 7–25)
CO2: 27 mmol/L (ref 20–31)
CREATININE: 0.78 mg/dL (ref 0.50–0.99)
Calcium: 9.8 mg/dL (ref 8.6–10.4)
Chloride: 97 mmol/L — ABNORMAL LOW (ref 98–110)
GLUCOSE: 197 mg/dL — AB (ref 65–99)
POTASSIUM: 4.4 mmol/L (ref 3.5–5.3)
Sodium: 138 mmol/L (ref 135–146)

## 2016-03-11 LAB — CBC
HCT: 42.4 % (ref 35.0–45.0)
Hemoglobin: 13.7 g/dL (ref 11.7–15.5)
MCH: 30.1 pg (ref 27.0–33.0)
MCHC: 32.3 g/dL (ref 32.0–36.0)
MCV: 93.2 fL (ref 80.0–100.0)
MPV: 9.8 fL (ref 7.5–12.5)
PLATELETS: 320 10*3/uL (ref 140–400)
RBC: 4.55 MIL/uL (ref 3.80–5.10)
RDW: 14.8 % (ref 11.0–15.0)
WBC: 14.3 10*3/uL — AB (ref 3.8–10.8)

## 2016-03-11 NOTE — Progress Notes (Signed)
Cardiology Office Note   Date:  03/11/2016   ID:  Leah Ferrell, DOB November 15, 1950, MRN [id number]  PCP:  Evelina Dun, FNP  Cardiologist: Dr. Harl Bowie  Chief Complaint  Patient presents with  . PAD    17 month ROV      History of Present Illness: Leah Ferrell is a 66 y.o. female who presents for a followup visit regarding peripheral arterial disease.  She has known history of coronary artery disease status post drug-eluting stent placement to the LAD X 2 Most recently in 2015 for in-stent restenosis.  She is known to have peripheral arterial disease status post left common iliac artery stent placement followed by left common femoral artery resection and bypass from left external iliac to the left profunda done by Dr. Kellie Simmering in 2015. She is known to have chronically occluded left SFA being managed medically.  Most recent noninvasive vascular evaluation in September at [facility] showed moderately reduced ABI on the left side with severely elevated velocity at the proximal anastomosis of the graft on the left side. The patient was advised to increase her physical activities but since then she has experienced progressive left leg claudication which has limited her ability to continue exercising on a treadmill. She used to be able to walk 30 minutes on the treadmill without having to stop and now she has to stop after about 10 minutes. She also complains of worsening exertional dyspnea over the last few weeks without chest discomfort.  Past Medical History:  Diagnosis Date  . CAD (coronary artery disease)    a. 04/2012 NSTEMI: s/p DES to LAD.  Marland Kitchen Chronic diastolic CHF (congestive heart failure) ([place])   . Diabetes mellitus without complication ([place])   . Dysrhythmia   . Environmental and seasonal allergies   . Hyperlipidemia   . Hypertension   . Leukocytosis   . Morbid obesity ([place])   . NSTEMI (non-ST elevated myocardial infarction) ([place]) 04/27/2012  . PONV (postoperative nausea and vomiting)    . PVD (peripheral vascular disease) ([place])    a. 04/2012 ABI: R 0.49, L 0.39. Angiography 06/14: Significant ostial left common iliac artery stenosis extending into the distal aorta, severe left common femoral artery stenosis, bilateral SFA occlusion with heavy calcifications. Functionally, one-vessel runoff bilaterally below the knee  . Shortness of breath     Past Surgical History:  Procedure Laterality Date  . ABDOMINAL AORTAGRAM N/A 07/21/2012   Procedure: ABDOMINAL Maxcine Ham;  Surgeon: Wellington Hampshire, MD;  Location: [place] CATH LAB;  Service: Cardiovascular;  Laterality: N/A;  . CARDIAC CATHETERIZATION     stents X 2  . CORONARY ANGIOGRAM  04/27/2012   Procedure: CORONARY ANGIOGRAM;  Surgeon: Thayer Headings, MD;  Location: [facility] CATH LAB;  Service: Cardiovascular;;  . ENDARTERECTOMY FEMORAL Left 11/02/2013   Procedure: RESECTION LEFT COMMON FEMORAL ARTERY AND INSERTION OF INTERPOSITION 6mm HEMASHIELD GRAFT FROM LEFT EXTERNAL  ILIAC ARTERY TO LEFT PROFUNDA  FEMORIS ARTERY;  Surgeon: Mal Misty, MD;  Location: [place];  Service: Vascular;  Laterality: Left;  . EYE SURGERY Right 2017  . FRACTURE SURGERY Right Jul 07, 2014   Right Foot-  Pt.[place]  at Home  by Heat duct  . ILIAC ARTERY STENT Left 08/31/2013  . LAD stent    . LEFT HEART CATHETERIZATION WITH CORONARY ANGIOGRAM N/A 04/23/2012   Procedure: LEFT HEART CATHETERIZATION WITH CORONARY ANGIOGRAM;  Surgeon: Peter M Martinique, MD;  [facility] C[facility] LAB;  Service: Cardiovascular;  Laterality: N/A;  . LOWER  EXTREMITY ANGIOGRAM Left 08/31/2013   Procedure: LOWER EXTREMITY ANGIOGRAM;  Surgeon: Wellington Hampshire, MD;  Location: [place] CATH LAB;  Service: Cardiovascular;  Laterality: Left;  . PERCUTANEOUS STENT INTERVENTION Left 08/31/2013   Procedure: PERCUTANEOUS STENT INTERVENTION;  Surgeon: Wellington Hampshire, MD;  Location: [place] CATH LAB;  Service: Cardiovascular;  Laterality: Left;  Common Iliac artery  . 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 albuterol (PROVENTIL HFA) 108 (90 Base) MCG/ACT inhaler INHALE ONE TO TWO PUFFS BY MOUTH EVERY 6 HOURS AS NEEDED FOR WHEEZING OR FOR SHORTNESS OF BREATH 18 g 3  . amLODipine (NORVASC) 10 MG tablet Take 1 tablet (10 mg total) by mouth daily. 90 tablet 2  . Ascorbic Acid (VITAMIN C) 500 MG CAPS Take 1 capsule by mouth daily.     Marland Kitchen aspirin EC 81 MG tablet Take 81 mg by mouth daily.     . Calcium Carbonate-Vit D-Min (CALTRATE 600+D PLUS MINERALS) 600-800 MG-UNIT TABS Take 1 tablet by mouth 2 (two) times daily.    . clopidogrel (PLAVIX) 75 MG tablet Take 1 tablet (75 mg total) by mouth daily. 90 tablet 3  . conjugated estrogens (PREMARIN) vaginal cream Place 1 Applicatorful vaginally daily. 42.5 g 3  . fish oil-omega-3 fatty acids 1000 MG capsule Take 1 g by mouth every morning.    . furosemide (LASIX) 40 MG tablet Take 1 tablet (40 mg total) by mouth daily. 90 tablet 0  . furosemide (LASIX) 40 MG tablet TAKE ONE TABLET BY MOUTH ONCE DAILY 90 tablet 0  . hydroxypropyl methylcellulose (ISOPTO TEARS) 2.5 % ophthalmic solution Place 1 drop into both eyes 3 (three) times daily as needed (for dry eyes).    . Hypromell-Glycerin-Naphazoline 0.8-0.25-0.012 % SOLN Apply 1 application to eye daily as needed (dry eyes).     . Insulin Pen Needle (RELION PEN NEEDLES) 29G X 12MM MISC Three times a day 1000 each 12  . lisinopril (PRINIVIL,ZESTRIL) 20 MG tablet Take 2 tablets (40 mg total) by mouth daily. 180 tablet 3  . metoprolol succinate (TOPROL-XL) 25 MG 24 hr tablet Take 1 tablet (25 mg total) by mouth daily. For total dose of 75 mg daily 90 tablet 0  . metoprolol succinate (TOPROL-XL) 50 MG 24 hr tablet Take 1 tablet (50 mg total) by mouth daily. Take with or immediately following a meal. For a total dose of 75 mg 90 tablet 1  . montelukast (SINGULAIR) 10 MG tablet Take 1 tablet (10 mg total) by mouth daily with breakfast. 90 tablet 2   . Multiple Vitamin (MULTIVITAMIN WITH MINERALS) TABS Take 1 tablet by mouth daily.    . Naftifine HCl 2 % CREA Apply to bilateral feet BID 45 g 1  . nitroGLYCERIN (NITROSTAT) 0.4 MG SL tablet Place 1 tablet (0.4 mg total) under the tongue every 5 (five) minutes x 3 doses as needed for chest pain (up to 3 doses). 25 tablet 4  . NOVOLOG MIX 70/30 FLEXPEN (70-30) 100 UNIT/ML FlexPen INJECT SUBCUTANEOUSLY 75  UNITS 3 TIMES DAILY WITH  MEALS 150 mL 1  . potassium chloride SA (KLOR-CON M20) 20 MEQ tablet Take 1 tablet (20 mEq total) by mouth daily. 90 tablet 0  . RELION PEN NEEDLES 29G X 12MM MISC USE 1 NEEDLE TO INJECT INSULIN SUBCUTANEOUSLY 3 TIMES DAILY 50 each 5  . rosuvastatin (CRESTOR) 20 MG tablet Take 1 tablet (20 mg  total) by mouth daily. 90 tablet 3   No current facility-administered medications for this visit.     Allergies:   Tape    Social History:  The patient  reports that she quit smoking about 15 years ago. Her smoking use included Cigarettes. She started smoking about 51 years ago. She has a 36.00 pack-year smoking history. She has never used smokeless tobacco. She reports that she does not drink alcohol or use drugs.   Family History:  The patient's family history includes Alcohol abuse in her brother; Diabetes in her brother, daughter, maternal grandmother, mother, and son; Heart attack in her maternal grandfather; Heart disease in her brother; Hyperlipidemia in her mother and son; Hypertension in her daughter, mother, and son; Other in her sister and sister; Peripheral vascular disease in her mother.    ROS:  Please see the history of present illness.   Otherwise, review of systems are positive for none.   All other systems are reviewed and negative.    PHYSICAL EXAM: VS:  BP (!) 150/50   Pulse 74   Ht 5' 2.5" (1.588 m)   Wt 214 lb 12.8 oz (97.4 kg)   BMI 38.66 kg/m  , BMI Body mass index is 38.66 kg/m. GEN: Well nourished, well developed, in no acute distress  HEENT:  normal  Neck: no JVD, carotid bruits, or masses Cardiac: RRR; no  rubs, or gallops , trace edema . There is 2/6 systolic ejection murmur in the aortic area Respiratory:  clear to auscultation bilaterally, normal work of breathing GI: soft, nontender, nondistended, + BS MS: no deformity or atrophy  Skin: warm and dry, no rash Neuro:  Strength and sensation are intact Psych: euthymic mood, full affect Vascular: Femoral pulses are +1 bilaterally. Distal pulses are not palpable.  EKG:  EKG is ordered today. The ekg ordered today demonstrates normal sinus rhythm with no significant ST or T wave changes.   Recent Labs: 01/21/2016: ALT 22; BUN 15; Creatinine, Ser 0.65; Potassium 4.6; Sodium 144    Lipid Panel    Component Value Date/Time   CHOL 173 01/21/2016 1517   TRIG 155 (H) 01/21/2016 1517   TRIG 135 09/16/2012 1322   HDL 65 01/21/2016 1517   HDL 60 09/16/2012 1322   CHOLHDL 2.7 01/21/2016 1517   CHOLHDL 3.2 05/06/2012 1713   VLDL 32 05/06/2012 1713   LDLCALC 77 01/21/2016 1517   LDLCALC 92 09/16/2012 1322      Wt Readings from Last 3 Encounters:  03/11/16 214 lb 12.8 oz (97.4 kg)  01/21/16 212 lb (96.2 kg)  12/10/15 211 lb (95.7 kg)       No flowsheet data found.    ASSESSMENT AND PLAN:  1.  Peripheral arterial disease: Currently with severe left leg claudication (Rutherford class III). Known occluded left SFA with previous left external the left profunda artery bypass. Noninvasive vascular evaluation in September showed severely elevated velocity at the proximal anastomosis which is the likely culprit for her symptoms. She has attempted to increase physical activities with no significant improvement. Thus, I recommend proceeding with angiography and possible endovascular intervention. I discussed risks and benefits. She is already on dual antiplatelet therapy.  2. Coronary artery disease involving native coronary arteries. She is reporting worsening exertional  dyspnea and thus I requested a pharmacologic nuclear stress test. She has a systolic murmur in the aortic area with no echocardiogram since 2014. I requested an echocardiogram but her exam is not suggestive of significant aortic stenosis.  3. Essential hypertension: Blood pressure is mildly elevated: Continue to monitor and consider switching metoprolol to carvedilol.  4. Hyperlipidemia: Continue treatment with rosuvastatin with a target LDL of less than 70. Most recent LDL was 77 which is close to target.    Disposition:   FU with me in 1 month  Signed,  [name], MD  03/11/2016 10:58 AM    [place]

## 2016-03-11 NOTE — Patient Instructions (Addendum)
Medication Instructions:  Your physician recommends that you continue on your current medications as directed. Please refer to the Current Medication list given to you today.  Labwork: Your physician recommends that you have lab work today: BMP, CBC and PT/INR  Testing/Procedures: Your physician has requested that you have an echocardiogram. Echocardiography is a painless test that uses sound waves to create images of your heart. It provides your doctor with information about the size and shape of your heart and how well your heart's chambers and valves are working. This procedure takes approximately one hour. There are no restrictions for this procedure.  Your physician has requested that you have a lexiscan myoview (prior to 03/19/16 angiogram). For further information please visit [email or web address]. Please follow instruction sheet, as given.  Your physician has requested that you have a peripheral vascular angiogram. This exam is performed at the hospital. During this exam IV contrast is used to look at arterial blood flow. Please review the information sheet given for details.  Follow-Up: Your physician recommends that you schedule a follow-up appointment in: 3-4 WEEKS with Dr Fletcher [name]   Any Other Special Instructions Will Be Listed Below (If Applicable).     If you need a refill on your cardiac medications before your next appointment, please call your pharmacy.

## 2016-03-12 ENCOUNTER — Telehealth (HOSPITAL_COMMUNITY): Payer: Self-pay | Admitting: *Deleted

## 2016-03-12 LAB — PROTIME-INR
INR: 1
PROTHROMBIN TIME: 10.2 s (ref 9.0–11.5)

## 2016-03-12 NOTE — Telephone Encounter (Signed)
Patient given detailed instructions per Myocardial Perfusion Study Information Sheet for the test on 03/13/16 at 12:30. Patient notified to arrive 15 minutes early and that it is imperative to arrive on time for appointment to keep from having the test rescheduled.  If you need to cancel or reschedule your appointment, please call the office within 24 hours of your appointment. Failure to do so may result in a cancellation of your appointment, and a $50 no show fee. Patient verbalized understanding.Veronia Beets

## 2016-03-13 ENCOUNTER — Ambulatory Visit (HOSPITAL_COMMUNITY): Payer: 59 | Attending: Internal Medicine

## 2016-03-13 ENCOUNTER — Other Ambulatory Visit: Payer: Self-pay | Admitting: Family

## 2016-03-13 ENCOUNTER — Telehealth: Payer: Self-pay | Admitting: Family

## 2016-03-13 DIAGNOSIS — R0602 Shortness of breath: Secondary | ICD-10-CM

## 2016-03-13 MED ORDER — REGADENOSON 0.4 MG/5ML IV SOLN
0.4000 mg | Freq: Once | INTRAVENOUS | Status: AC
  Administered 2016-03-13: 0.4 mg via INTRAVENOUS

## 2016-03-13 MED ORDER — TECHNETIUM TC 99M TETROFOSMIN IV KIT
32.8000 | PACK | Freq: Once | INTRAVENOUS | Status: AC | PRN
  Administered 2016-03-13: 32.8 via INTRAVENOUS
  Filled 2016-03-13: qty 33

## 2016-03-13 NOTE — Telephone Encounter (Signed)
The metoprolol XL is a 24 hour medication. You only take it daily. There is a metoprolol you take BID, but this was changed to daily. If pt's BP is doing well with XL lets keep it this way so she will not have to so many pills a day.

## 2016-03-14 ENCOUNTER — Telehealth: Payer: Self-pay | Admitting: Family

## 2016-03-14 ENCOUNTER — Other Ambulatory Visit: Payer: Self-pay | Admitting: Family

## 2016-03-14 ENCOUNTER — Ambulatory Visit (HOSPITAL_COMMUNITY): Payer: 59 | Attending: Cardiovascular Disease

## 2016-03-14 DIAGNOSIS — I10 Essential (primary) hypertension: Secondary | ICD-10-CM

## 2016-03-14 LAB — MYOCARDIAL PERFUSION IMAGING
CHL CUP NUCLEAR SRS: 0
CHL CUP NUCLEAR SSS: 3
LHR: 0.28
LV dias vol: 108 mL (ref 46–106)
LV sys vol: 44 mL
NUC STRESS TID: 1.02
Peak HR: 77 {beats}/min
Rest HR: 62 {beats}/min
SDS: 3

## 2016-03-14 MED ORDER — LISINOPRIL 20 MG PO TABS: 40.0000 mg | ORAL_TABLET | Freq: Every day | ORAL | 3 refills | Status: DC

## 2016-03-14 MED ORDER — TECHNETIUM TC 99M TETROFOSMIN IV KIT
32.3000 | PACK | Freq: Once | INTRAVENOUS | Status: AC | PRN
  Administered 2016-03-14: 32.3 via INTRAVENOUS
  Filled 2016-03-14: qty 33

## 2016-03-14 MED ORDER — AMLODIPINE BESYLATE 10 MG PO TABS: 10.0000 mg | ORAL_TABLET | Freq: Every day | ORAL | 2 refills | Status: DC

## 2016-03-14 MED ORDER — METOPROLOL SUCCINATE ER 25 MG PO TB24: 75.0000 mg | ORAL_TABLET | Freq: Every day | ORAL | 1 refills | Status: DC

## 2016-03-14 NOTE — Telephone Encounter (Signed)
Aware to take one each of blood pressure medications. One month script of each will be sent to [facility].  Patient will check with mail order concerning delay of her scripts .

## 2016-03-14 NOTE — Telephone Encounter (Signed)
Provider has talked with patient concerning medication changes.

## 2016-03-14 NOTE — Progress Notes (Signed)
Pt called and states she was out of her BP medication. Pt states she ran out of her metoprolol so she was "taking two of her norvasc and lisinopril". Discussed that patient can not take "extra pill" just because she ran of a medication. Will send in refill of medication to [place] until her mail order pharmacy can send her the medication

## 2016-03-19 ENCOUNTER — Ambulatory Visit (HOSPITAL_COMMUNITY)
Admission: RE | Admit: 2016-03-19 | Discharge: 2016-03-19 | Disposition: A | Discharge status: Home / Self Care | Payer: 59 | Source: Ambulatory Visit | Attending: Cardiovascular Disease | Admitting: Cardiovascular Disease

## 2016-03-19 ENCOUNTER — Encounter (HOSPITAL_COMMUNITY)
Admission: RE | Disposition: A | Discharge status: Home / Self Care | Payer: Self-pay | Source: Ambulatory Visit | Attending: Cardiovascular Disease

## 2016-03-19 DIAGNOSIS — I110 Hypertensive heart disease with heart failure: Secondary | ICD-10-CM | POA: Diagnosis not present

## 2016-03-19 DIAGNOSIS — Z794 Long term (current) use of insulin: Secondary | ICD-10-CM | POA: Diagnosis not present

## 2016-03-19 DIAGNOSIS — Z7902 Long term (current) use of antithrombotics/antiplatelets: Secondary | ICD-10-CM | POA: Insufficient documentation

## 2016-03-19 DIAGNOSIS — I2510 Atherosclerotic heart disease of native coronary artery without angina pectoris: Secondary | ICD-10-CM | POA: Insufficient documentation

## 2016-03-19 DIAGNOSIS — I252 Old myocardial infarction: Secondary | ICD-10-CM | POA: Diagnosis not present

## 2016-03-19 DIAGNOSIS — I701 Atherosclerosis of renal artery: Secondary | ICD-10-CM | POA: Insufficient documentation

## 2016-03-19 DIAGNOSIS — Z833 Family history of diabetes mellitus: Secondary | ICD-10-CM | POA: Insufficient documentation

## 2016-03-19 DIAGNOSIS — Z7982 Long term (current) use of aspirin: Secondary | ICD-10-CM | POA: Diagnosis not present

## 2016-03-19 DIAGNOSIS — T82856A Stenosis of peripheral vascular stent, initial encounter: Secondary | ICD-10-CM | POA: Insufficient documentation

## 2016-03-19 DIAGNOSIS — Z6838 Body mass index (BMI) 38.0-38.9, adult: Secondary | ICD-10-CM | POA: Diagnosis not present

## 2016-03-19 DIAGNOSIS — I5032 Chronic diastolic (congestive) heart failure: Secondary | ICD-10-CM | POA: Insufficient documentation

## 2016-03-19 DIAGNOSIS — Z8249 Family history of ischemic heart disease and other diseases of the circulatory system: Secondary | ICD-10-CM | POA: Diagnosis not present

## 2016-03-19 DIAGNOSIS — Z87891 Personal history of nicotine dependence: Secondary | ICD-10-CM | POA: Diagnosis not present

## 2016-03-19 DIAGNOSIS — E1151 Type 2 diabetes mellitus with diabetic peripheral angiopathy without gangrene: Secondary | ICD-10-CM | POA: Insufficient documentation

## 2016-03-19 DIAGNOSIS — Y812 Prosthetic and other implants, materials and accessory general- and plastic-surgery devices associated with adverse incidents: Secondary | ICD-10-CM | POA: Insufficient documentation

## 2016-03-19 DIAGNOSIS — E785 Hyperlipidemia, unspecified: Secondary | ICD-10-CM | POA: Insufficient documentation

## 2016-03-19 DIAGNOSIS — Z955 Presence of coronary angioplasty implant and graft: Secondary | ICD-10-CM | POA: Diagnosis not present

## 2016-03-19 DIAGNOSIS — I70212 Atherosclerosis of native arteries of extremities with intermittent claudication, left leg: Secondary | ICD-10-CM | POA: Insufficient documentation

## 2016-03-19 DIAGNOSIS — I739 Peripheral vascular disease, unspecified: Secondary | ICD-10-CM

## 2016-03-19 HISTORY — PX: PERIPHERAL VASCULAR CATHETERIZATION: SHX172C

## 2016-03-19 LAB — GLUCOSE, CAPILLARY
GLUCOSE-CAPILLARY: 101 mg/dL — AB (ref 65–99)
Glucose-Capillary: 83 mg/dL (ref 65–99)

## 2016-03-19 LAB — POCT ACTIVATED CLOTTING TIME: Activated Clotting Time: 257 seconds

## 2016-03-19 SURGERY — ABDOMINAL AORTOGRAM W/LOWER EXTREMITY

## 2016-03-19 MED ORDER — SODIUM CHLORIDE 0.9 % IV SOLN: INTRAVENOUS | Status: AC

## 2016-03-19 MED ORDER — HEPARIN SODIUM (PORCINE) 1000 UNIT/ML IJ SOLN
INTRAMUSCULAR | Status: DC | PRN
  Administered 2016-03-19: 6000 [IU] via INTRAVENOUS

## 2016-03-19 MED ORDER — LIDOCAINE HCL (PF) 1 % IJ SOLN
INTRAMUSCULAR | Status: DC | PRN
  Administered 2016-03-19: 20 mL

## 2016-03-19 MED ORDER — SODIUM CHLORIDE 0.9% FLUSH: 3.0000 mL | Freq: Two times a day (BID) | INTRAVENOUS | Status: DC

## 2016-03-19 MED ORDER — FENTANYL CITRATE (PF) 100 MCG/2ML IJ SOLN
INTRAMUSCULAR | Status: AC
  Filled 2016-03-19: qty 2

## 2016-03-19 MED ORDER — HEPARIN (PORCINE) IN NACL 2-0.9 UNIT/ML-% IJ SOLN
INTRAMUSCULAR | Status: AC
  Filled 2016-03-19: qty 1000

## 2016-03-19 MED ORDER — HEPARIN SODIUM (PORCINE) 1000 UNIT/ML IJ SOLN
INTRAMUSCULAR | Status: AC
  Filled 2016-03-19: qty 1

## 2016-03-19 MED ORDER — SODIUM CHLORIDE 0.9 % IV SOLN
250.0000 mL | INTRAVENOUS | Status: DC | PRN
Start: 2016-03-19 — End: 2016-03-19

## 2016-03-19 MED ORDER — MIDAZOLAM HCL 2 MG/2ML IJ SOLN
INTRAMUSCULAR | Status: DC | PRN
  Administered 2016-03-19: 1 mg via INTRAVENOUS

## 2016-03-19 MED ORDER — SODIUM CHLORIDE 0.9 % IV SOLN
INTRAVENOUS | Status: DC
  Administered 2016-03-19: 07:00:00 via INTRAVENOUS

## 2016-03-19 MED ORDER — SODIUM CHLORIDE 0.9 % IV SOLN: 250.0000 mL | INTRAVENOUS | Status: DC | PRN

## 2016-03-19 MED ORDER — HEPARIN (PORCINE) IN NACL 2-0.9 UNIT/ML-% IJ SOLN
INTRAMUSCULAR | Status: DC | PRN
  Administered 2016-03-19: 1000 mL

## 2016-03-19 MED ORDER — SODIUM CHLORIDE 0.9% FLUSH: 3.0000 mL | INTRAVENOUS | Status: DC | PRN

## 2016-03-19 MED ORDER — MIDAZOLAM HCL 2 MG/2ML IJ SOLN
INTRAMUSCULAR | Status: AC
  Filled 2016-03-19: qty 2

## 2016-03-19 MED ORDER — IODIXANOL 320 MG/ML IV SOLN
INTRAVENOUS | Status: DC | PRN
  Administered 2016-03-19: 120 mL via INTRA_ARTERIAL

## 2016-03-19 MED ORDER — LIDOCAINE HCL (PF) 1 % IJ SOLN
INTRAMUSCULAR | Status: AC
  Filled 2016-03-19: qty 30

## 2016-03-19 MED ORDER — FENTANYL CITRATE (PF) 100 MCG/2ML IJ SOLN
INTRAMUSCULAR | Status: DC | PRN
  Administered 2016-03-19: 25 ug via INTRAVENOUS
  Administered 2016-03-19: 50 ug via INTRAVENOUS

## 2016-03-19 MED ORDER — ASPIRIN 81 MG PO CHEW: 81.0000 mg | CHEWABLE_TABLET | ORAL | Status: DC

## 2016-03-19 SURGICAL SUPPLY — 17 items
BALLN IN.PACT DCB 6X40 (BALLOONS) ×3
BALLN MUSTANG 5.0X40 75 (BALLOONS) ×3
BALLOON MUSTANG 5.0X40 75 (BALLOONS) IMPLANT
CATH ANGIO 5F PIGTAIL 65CM (CATHETERS) ×1 IMPLANT
CATH CROSS OVER TEMPO 5F (CATHETERS) ×1 IMPLANT
DCB IN.PACT 6X40 (BALLOONS) IMPLANT
KIT ENCORE 26 ADVANTAGE (KITS) ×1 IMPLANT
KIT PV (KITS) ×3 IMPLANT
SHEATH PINNACLE 5F 10CM (SHEATH) ×1 IMPLANT
SHEATH PINNACLE MP 6F 45CM (SHEATH) ×1 IMPLANT
STOPCOCK MORSE 400PSI 3WAY (MISCELLANEOUS) ×1 IMPLANT
SYRINGE MEDRAD AVANTA MACH 7 (SYRINGE) ×1 IMPLANT
TRANSDUCER W/STOPCOCK (MISCELLANEOUS) ×3 IMPLANT
TRAY PV CATH (CUSTOM PROCEDURE TRAY) ×3 IMPLANT
TUBING CIL FLEX 10 FLL-RA (TUBING) ×1 IMPLANT
WIRE HI TORQ VERSACORE J 260CM (WIRE) ×1 IMPLANT
WIRE MINI STICK MAX (SHEATH) ×1 IMPLANT

## 2016-03-19 NOTE — Progress Notes (Signed)
Report received/ care accepted.  

## 2016-03-19 NOTE — Progress Notes (Signed)
Site area: Right groin a 6 french arterial long sheath was removed  Site Prior to Removal:  Level 0  Pressure Applied For 15 MINUTES    Bedrest Beginning at 1110am  Manual:   Yes.    Patient Status During Pull:  stable  Post Pull Groin Site:  Level 0  Post Pull Instructions Given:  Yes.    Post Pull Pulses Present:  Yes.    Dressing Applied:  Yes.    Comments:  VS remain stable during sheath

## 2016-03-19 NOTE — Interval H&P Note (Signed)
History and Physical Interval Note:  03/19/2016 8:44 AM  Leah Ferrell  has presented today for surgery, with the diagnosis of pad  The various methods of treatment have been discussed with the patient and family. After consideration of risks, benefits and other options for treatment, the patient has consented to  Procedure(s): Abdominal Aortogram w/Lower Extremity (N/A) as a surgical intervention .  The patient's history has been reviewed, patient examined, no change in status, stable for surgery.  I have reviewed the patient's chart and labs.  Questions were answered to the patient's satisfaction.     [name]

## 2016-03-19 NOTE — H&P (View-Only) (Signed)
Cardiology Office Note   Date:  03/11/2016   ID:  Leah Ferrell, DOB 1950/09/22, MRN [id number]  PCP:  Evelina Dun, FNP  Cardiologist: Dr. Harl Bowie  Chief Complaint  Patient presents with  . PAD    17 month ROV      History of Present Illness: Leah Ferrell is a 66 y.o. female who presents for a followup visit regarding peripheral arterial disease.  She has known history of coronary artery disease status post drug-eluting stent placement to the LAD X 2 Most recently in 2015 for in-stent restenosis.  She is known to have peripheral arterial disease status post left common iliac artery stent placement followed by left common femoral artery resection and bypass from left external iliac to the left profunda done by Dr. Kellie Simmering in 2015. She is known to have chronically occluded left SFA being managed medically.  Most recent noninvasive vascular evaluation in September at [facility] showed moderately reduced ABI on the left side with severely elevated velocity at the proximal anastomosis of the graft on the left side. The patient was advised to increase her physical activities but since then she has experienced progressive left leg claudication which has limited her ability to continue exercising on a treadmill. She used to be able to walk 30 minutes on the treadmill without having to stop and now she has to stop after about 10 minutes. She also complains of worsening exertional dyspnea over the last few weeks without chest discomfort.  Past Medical History:  Diagnosis Date  . CAD (coronary artery disease)    a. 04/2012 NSTEMI: s/p DES to LAD.  Marland Kitchen Chronic diastolic CHF (congestive heart failure) ([place])   . Diabetes mellitus without complication ([place])   . Dysrhythmia   . Environmental and seasonal allergies   . Hyperlipidemia   . Hypertension   . Leukocytosis   . Morbid obesity ([place])   . NSTEMI (non-ST elevated myocardial infarction) ([place]) 04/27/2012  . PONV (postoperative nausea and vomiting)    . PVD (peripheral vascular disease) ([place])    a. 04/2012 ABI: R 0.49, L 0.39. Angiography 06/14: Significant ostial left common iliac artery stenosis extending into the distal aorta, severe left common femoral artery stenosis, bilateral SFA occlusion with heavy calcifications. Functionally, one-vessel runoff bilaterally below the knee  . Shortness of breath     Past Surgical History:  Procedure Laterality Date  . ABDOMINAL AORTAGRAM N/A 07/21/2012   Procedure: ABDOMINAL Maxcine Ham;  Surgeon: Wellington Hampshire, MD;  Location: [facility] CATH LAB;  Service: Cardiovascular;  Laterality: N/A;  . CARDIAC CATHETERIZATION     stents X 2  . CORONARY ANGIOGRAM  04/27/2012   Procedure: CORONARY ANGIOGRAM;  Surgeon: Thayer Headings, MD;  Location: [name] CATH LAB;  Service: Cardiovascular;;  . ENDARTERECTOMY FEMORAL Left 11/02/2013   Procedure: RESECTION LEFT COMMON FEMORAL ARTERY AND INSERTION OF INTERPOSITION 1mm HEMASHIELD GRAFT FROM LEFT EXTERNAL  ILIAC ARTERY TO LEFT PROFUNDA  FEMORIS ARTERY;  Surgeon: Mal Misty, MD;  Location: [facility];  Service: Vascular;  Laterality: Left;  . EYE SURGERY Right 2017  . FRACTURE SURGERY Right Jul 07, 2014   Right Foot-  Pt.fell  at Home  by Heat duct  . ILIAC ARTERY STENT Left 08/31/2013  . LAD stent    . LEFT HEART CATHETERIZATION WITH CORONARY ANGIOGRAM N/A 04/23/2012   Procedure: LEFT HEART CATHETERIZATION WITH CORONARY ANGIOGRAM;  Surgeon: Peter M Martinique, MD;  Location[facility] C[facility] LAB;  Service: Cardiovascular;  Laterality: N/A;  . LOWER  EXTREMITY ANGIOGRAM Left 08/31/2013   Procedure: LOWER EXTREMITY ANGIOGRAM;  Surgeon: Wellington Hampshire, MD;  Location: [facility] CATH LAB;  Service: Cardiovascular;  Laterality: Left;  . PERCUTANEOUS STENT INTERVENTION Left 08/31/2013   Procedure: PERCUTANEOUS STENT INTERVENTION;  Surgeon: Wellington Hampshire, MD;  Location: [facility] CATH LAB;  Service: Cardiovascular;  Laterality: Left;  Common Iliac artery  . 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 albuterol (PROVENTIL HFA) 108 (90 Base) MCG/ACT inhaler INHALE ONE TO TWO PUFFS BY MOUTH EVERY 6 HOURS AS NEEDED FOR WHEEZING OR FOR SHORTNESS OF BREATH 18 g 3  . amLODipine (NORVASC) 10 MG tablet Take 1 tablet (10 mg total) by mouth daily. 90 tablet 2  . Ascorbic Acid (VITAMIN C) 500 MG CAPS Take 1 capsule by mouth daily.     Marland Kitchen aspirin EC 81 MG tablet Take 81 mg by mouth daily.     . Calcium Carbonate-Vit D-Min (CALTRATE 600+D PLUS MINERALS) 600-800 MG-UNIT TABS Take 1 tablet by mouth 2 (two) times daily.    . clopidogrel (PLAVIX) 75 MG tablet Take 1 tablet (75 mg total) by mouth daily. 90 tablet 3  . conjugated estrogens (PREMARIN) vaginal cream Place 1 Applicatorful vaginally daily. 42.5 g 3  . fish oil-omega-3 fatty acids 1000 MG capsule Take 1 g by mouth every morning.    . furosemide (LASIX) 40 MG tablet Take 1 tablet (40 mg total) by mouth daily. 90 tablet 0  . furosemide (LASIX) 40 MG tablet TAKE ONE TABLET BY MOUTH ONCE DAILY 90 tablet 0  . hydroxypropyl methylcellulose (ISOPTO TEARS) 2.5 % ophthalmic solution Place 1 drop into both eyes 3 (three) times daily as needed (for dry eyes).    . Hypromell-Glycerin-Naphazoline 0.8-0.25-0.012 % SOLN Apply 1 application to eye daily as needed (dry eyes).     . Insulin Pen Needle (RELION PEN NEEDLES) 29G X 12MM MISC Three times a day 1000 each 12  . lisinopril (PRINIVIL,ZESTRIL) 20 MG tablet Take 2 tablets (40 mg total) by mouth daily. 180 tablet 3  . metoprolol succinate (TOPROL-XL) 25 MG 24 hr tablet Take 1 tablet (25 mg total) by mouth daily. For total dose of 75 mg daily 90 tablet 0  . metoprolol succinate (TOPROL-XL) 50 MG 24 hr tablet Take 1 tablet (50 mg total) by mouth daily. Take with or immediately following a meal. For a total dose of 75 mg 90 tablet 1  . montelukast (SINGULAIR) 10 MG tablet Take 1 tablet (10 mg total) by mouth daily with breakfast. 90 tablet 2   . Multiple Vitamin (MULTIVITAMIN WITH MINERALS) TABS Take 1 tablet by mouth daily.    . Naftifine HCl 2 % CREA Apply to bilateral feet BID 45 g 1  . nitroGLYCERIN (NITROSTAT) 0.4 MG SL tablet Place 1 tablet (0.4 mg total) under the tongue every 5 (five) minutes x 3 doses as needed for chest pain (up to 3 doses). 25 tablet 4  . NOVOLOG MIX 70/30 FLEXPEN (70-30) 100 UNIT/ML FlexPen INJECT SUBCUTANEOUSLY 75  UNITS 3 TIMES DAILY WITH  MEALS 150 mL 1  . potassium chloride SA (KLOR-CON M20) 20 MEQ tablet Take 1 tablet (20 mEq total) by mouth daily. 90 tablet 0  . RELION PEN NEEDLES 29G X 12MM MISC USE 1 NEEDLE TO INJECT INSULIN SUBCUTANEOUSLY 3 TIMES DAILY 50 each 5  . rosuvastatin (CRESTOR) 20 MG tablet Take 1 tablet (20 mg  total) by mouth daily. 90 tablet 3   No current facility-administered medications for this visit.     Allergies:   Tape    Social History:  The patient  reports that she quit smoking about 15 years ago. Her smoking use included Cigarettes. She started smoking about 51 years ago. She has a 36.00 pack-year smoking history. She has never used smokeless tobacco. She reports that she does not drink alcohol or use drugs.   Family History:  The patient's family history includes Alcohol abuse in her brother; Diabetes in her brother, daughter, maternal grandmother, mother, and son; Heart attack in her maternal grandfather; Heart disease in her brother; Hyperlipidemia in her mother and son; Hypertension in her daughter, mother, and son; Other in her sister and sister; Peripheral vascular disease in her mother.    ROS:  Please see the history of present illness.   Otherwise, review of systems are positive for none.   All other systems are reviewed and negative.    PHYSICAL EXAM: VS:  BP (!) 150/50   Pulse 74   Ht 5' 2.5" (1.588 m)   Wt 214 lb 12.8 oz (97.4 kg)   BMI 38.66 kg/m  , BMI Body mass index is 38.66 kg/m. GEN: Well nourished, well developed, in no acute distress  HEENT:  normal  Neck: no JVD, carotid bruits, or masses Cardiac: RRR; no  rubs, or gallops , trace edema . There is 2/6 systolic ejection murmur in the aortic area Respiratory:  clear to auscultation bilaterally, normal work of breathing GI: soft, nontender, nondistended, + BS MS: no deformity or atrophy  Skin: warm and dry, no rash Neuro:  Strength and sensation are intact Psych: euthymic mood, full affect Vascular: Femoral pulses are +1 bilaterally. Distal pulses are not palpable.  EKG:  EKG is ordered today. The ekg ordered today demonstrates normal sinus rhythm with no significant ST or T wave changes.   Recent Labs: 01/21/2016: ALT 22; BUN 15; Creatinine, Ser 0.65; Potassium 4.6; Sodium 144    Lipid Panel    Component Value Date/Time   CHOL 173 01/21/2016 1517   TRIG 155 (H) 01/21/2016 1517   TRIG 135 09/16/2012 1322   HDL 65 01/21/2016 1517   HDL 60 09/16/2012 1322   CHOLHDL 2.7 01/21/2016 1517   CHOLHDL 3.2 05/06/2012 1713   VLDL 32 05/06/2012 1713   LDLCALC 77 01/21/2016 1517   LDLCALC 92 09/16/2012 1322      Wt Readings from Last 3 Encounters:  03/11/16 214 lb 12.8 oz (97.4 kg)  01/21/16 212 lb (96.2 kg)  12/10/15 211 lb (95.7 kg)       No flowsheet data found.    ASSESSMENT AND PLAN:  1.  Peripheral arterial disease: Currently with severe left leg claudication (Rutherford class III). Known occluded left SFA with previous left external the left profunda artery bypass. Noninvasive vascular evaluation in September showed severely elevated velocity at the proximal anastomosis which is the likely culprit for her symptoms. She has attempted to increase physical activities with no significant improvement. Thus, I recommend proceeding with angiography and possible endovascular intervention. I discussed risks and benefits. She is already on dual antiplatelet therapy.  2. Coronary artery disease involving native coronary arteries. She is reporting worsening exertional  dyspnea and thus I requested a pharmacologic nuclear stress test. She has a systolic murmur in the aortic area with no echocardiogram since 2014. I requested an echocardiogram but her exam is not suggestive of significant aortic stenosis.  3. Essential hypertension: Blood pressure is mildly elevated: Continue to monitor and consider switching metoprolol to carvedilol.  4. Hyperlipidemia: Continue treatment with rosuvastatin with a target LDL of less than 70. Most recent LDL was 77 which is close to target.    Disposition:   FU with me in 1 month  Signed,  [name], MD  03/11/2016 10:58 AM    [place]

## 2016-03-19 NOTE — Discharge Instructions (Signed)
Femoral Site Care °Introduction °Refer to this sheet in the next few weeks. These instructions provide you with information about caring for yourself after your procedure. Your health care provider may also give you more specific instructions. Your treatment has been planned according to current medical practices, but problems sometimes occur. Call your health care provider if you have any problems or questions after your procedure. °What can I expect after the procedure? °After your procedure, it is typical to have the following: °· Bruising at the site that usually fades within 1-2 weeks. °· Blood collecting in the tissue (hematoma) that may be painful to the touch. It should usually decrease in size and tenderness within 1-2 weeks. °Follow these instructions at home: °· Take medicines only as directed by your health care provider. °· You may shower 24-48 hours after the procedure or as directed by your health care provider. Remove the bandage (dressing) and gently wash the site with plain soap and water. Pat the area dry with a clean towel. Do not rub the site, because this may cause bleeding. °· Do not take baths, swim, or use a hot tub until your health care provider approves. °· Check your insertion site every day for redness, swelling, or drainage. °· Do not apply powder or lotion to the site. °· Limit use of stairs to twice a day for the first 2-3 days or as directed by your health care provider. °· Do not squat for the first 2-3 days or as directed by your health care provider. °· Do not lift over 10 lb (4.5 kg) for 5 days after your procedure or as directed by your health care provider. °· Ask your health care provider when it is okay to: °¨ Return to work or school. °¨ Resume usual physical activities or sports. °¨ Resume sexual activity. °· Do not drive home if you are discharged the same day as the procedure. Have someone else drive you. °· You may drive 24 hours after the procedure unless otherwise  instructed by your health care provider. °· Do not operate machinery or power tools for 24 hours after the procedure or as directed by your health care provider. °· If your procedure was done as an outpatient procedure, which means that you went home the same day as your procedure, a responsible adult should be with you for the first 24 hours after you arrive home. °· Keep all follow-up visits as directed by your health care provider. This is important. °Contact a health care provider if: °· You have a fever. °· You have chills. °· You have increased bleeding from the site. Hold pressure on the site. °Get help right away if: °· You have unusual pain at the site. °· You have redness, warmth, or swelling at the site. °· You have drainage (other than a small amount of blood on the dressing) from the site. °· The site is bleeding, and the bleeding does not stop after 30 minutes of holding steady pressure on the site. °· Your leg or foot becomes pale, cool, tingly, or numb. °This information is not intended to replace advice given to you by your health care provider. Make sure you discuss any questions you have with your health care provider. °Document Released: 10/07/2013 Document Revised: 07/12/2015 Document Reviewed: 08/23/2013 °© 2017 [employer]vier ° °

## 2016-03-20 ENCOUNTER — Encounter (HOSPITAL_COMMUNITY): Payer: Self-pay | Admitting: Cardiovascular Disease

## 2016-03-20 LAB — POCT ACTIVATED CLOTTING TIME
Activated Clotting Time: 186 seconds
Activated Clotting Time: 169 seconds

## 2016-03-21 ENCOUNTER — Telehealth: Payer: Self-pay | Admitting: Cardiovascular Disease

## 2016-03-21 DIAGNOSIS — I739 Peripheral vascular disease, unspecified: Secondary | ICD-10-CM

## 2016-03-21 NOTE — Telephone Encounter (Signed)
New Message   Pt has appt 2/8 for Echo. She wants to know if she can drive herself or does she need someone to drive her due to having surgery on 1/31.

## 2016-03-21 NOTE — Telephone Encounter (Signed)
Leah Hampshire, MD  Barkley Boards, RN        S/p left common femoral artery angioplasty today. She is scheduled to see me on 2/13. Schedule her for ABI and LE arterial duplex same day.    I spoke with the pt and made her aware that she is okay to drive herself to Echo appointment on 03/27/16. I also advised her that a scheduler will contact her to arrange follow-up studies from 03/19/16 procedure.

## 2016-03-27 ENCOUNTER — Other Ambulatory Visit: Payer: Self-pay

## 2016-03-27 ENCOUNTER — Ambulatory Visit (HOSPITAL_COMMUNITY): Payer: 59 | Attending: Cardiology

## 2016-03-27 DIAGNOSIS — R011 Cardiac murmur, unspecified: Secondary | ICD-10-CM | POA: Diagnosis not present

## 2016-03-27 DIAGNOSIS — E1151 Type 2 diabetes mellitus with diabetic peripheral angiopathy without gangrene: Secondary | ICD-10-CM | POA: Insufficient documentation

## 2016-03-27 DIAGNOSIS — I5032 Chronic diastolic (congestive) heart failure: Secondary | ICD-10-CM | POA: Insufficient documentation

## 2016-03-27 DIAGNOSIS — E785 Hyperlipidemia, unspecified: Secondary | ICD-10-CM | POA: Insufficient documentation

## 2016-03-27 DIAGNOSIS — I110 Hypertensive heart disease with heart failure: Secondary | ICD-10-CM | POA: Insufficient documentation

## 2016-03-27 DIAGNOSIS — I2510 Atherosclerotic heart disease of native coronary artery without angina pectoris: Secondary | ICD-10-CM | POA: Insufficient documentation

## 2016-03-27 DIAGNOSIS — I214 Non-ST elevation (NSTEMI) myocardial infarction: Secondary | ICD-10-CM | POA: Insufficient documentation

## 2016-04-01 ENCOUNTER — Ambulatory Visit (HOSPITAL_COMMUNITY)
Admission: RE | Admit: 2016-04-01 | Discharge: 2016-04-01 | Disposition: A | Discharge status: Home / Self Care | Payer: 59 | Source: Ambulatory Visit | Attending: Cardiovascular Disease | Admitting: Cardiovascular Disease

## 2016-04-01 ENCOUNTER — Ambulatory Visit (INDEPENDENT_AMBULATORY_CARE_PROVIDER_SITE_OTHER): Payer: 59 | Admitting: Cardiovascular Disease

## 2016-04-01 VITALS — BP 140/66 | HR 70 | Ht 62.0 in | Wt 215.0 lb

## 2016-04-01 DIAGNOSIS — E119 Type 2 diabetes mellitus without complications: Secondary | ICD-10-CM | POA: Insufficient documentation

## 2016-04-01 DIAGNOSIS — I10 Essential (primary) hypertension: Secondary | ICD-10-CM

## 2016-04-01 DIAGNOSIS — I2510 Atherosclerotic heart disease of native coronary artery without angina pectoris: Secondary | ICD-10-CM | POA: Insufficient documentation

## 2016-04-01 DIAGNOSIS — E782 Mixed hyperlipidemia: Secondary | ICD-10-CM | POA: Diagnosis not present

## 2016-04-01 DIAGNOSIS — E785 Hyperlipidemia, unspecified: Secondary | ICD-10-CM | POA: Insufficient documentation

## 2016-04-01 DIAGNOSIS — Z87891 Personal history of nicotine dependence: Secondary | ICD-10-CM | POA: Diagnosis not present

## 2016-04-01 DIAGNOSIS — I739 Peripheral vascular disease, unspecified: Secondary | ICD-10-CM

## 2016-04-01 DIAGNOSIS — Z95828 Presence of other vascular implants and grafts: Secondary | ICD-10-CM | POA: Diagnosis not present

## 2016-04-01 NOTE — Progress Notes (Signed)
Cardiology Office Note   Date:  04/01/2016   ID:  Leah Ferrell, DOB [birth year], MRN [id number]  PCP:  Evelina Dun, FNP  Cardiologist: Dr. Harl Bowie  Chief Complaint  Patient presents with  . Follow-up      History of Present Illness: Leah Ferrell is a 66 y.o. female who presents for a followup visit regarding peripheral arterial disease.  She has known history of coronary artery disease status post drug-eluting stent placement to the LAD X 2 Most recently in 2015 for in-stent restenosis.  She is known to have peripheral arterial disease status post left common iliac artery stent placement followed by left common femoral artery resection and bypass from left external iliac to the left profunda done by Dr. Kellie Simmering in 2015. She is known to have chronically occluded left SFA being managed medically.  Most recent noninvasive vascular evaluation in September at [facility] showed moderately reduced ABI on the left side with severely elevated velocity at the proximal anastomosis of the graft on the left side.  The patient was seen recently for worsening left leg claudication in spite of attempting to increase her physical activities. She also had worsening exertional dyspnea. She underwent a nuclear stress test which showed no evidence of ischemia with normal ejection fraction. Echocardiogram showed hyperdynamic LV systolic function, grade 1 diastolic dysfunction, aortic sclerosis without significant stenosis. Due to her left leg claudication and duplex findings, I proceeded with angiography which showed mild to moderate calcified common iliac artery disease with patent stent in the left common iliac artery. There was severe stenosis in the left common femoral artery at the proximal anastomosis of the bypass to the profunda. I performed successful angioplasty and drug-coated balloon angioplasty without complications. She reports resolution of claudication and improvement in shortness of breath. She  is going to resume her activities and exercise.     Past Medical History:  Diagnosis Date  . CAD (coronary artery disease)    a. 04/2012 NSTEMI: s/p DES to LAD.  Marland Kitchen Chronic diastolic CHF (congestive heart failure) ([place])   . Diabetes mellitus without complication ([place])   . Dysrhythmia   . Environmental and seasonal allergies   . Hyperlipidemia   . Hypertension   . Leukocytosis   . Morbid obesity ([place])   . NSTEMI (non-ST elevated myocardial infarction) ([place]) 04/27/2012  . PONV (postoperative nausea and vomiting)   . PVD (peripheral vascular disease) ([place])    a. 04/2012 ABI: R 0.49, L 0.39. Angiography 06/14: Significant ostial left common iliac artery stenosis extending into the distal aorta, severe left common femoral artery stenosis, bilateral SFA occlusion with heavy calcifications. Functionally, one-vessel runoff bilaterally below the knee  . Shortness of breath     Past Surgical History:  Procedure Laterality Date  . ABDOMINAL AORTAGRAM N/A 07/21/2012   Procedure: ABDOMINAL Maxcine Ham;  Surgeon: Wellington Hampshire, MD;  Location: [facility] CATH LAB;  Service: Cardiovascular;  Laterality: N/A;  . CARDIAC CATHETERIZATION     stents X 2  . CORONARY ANGIOGRAM  04/27/2012   Procedure: CORONARY ANGIOGRAM;  Surgeon: Thayer Headings, MD;  Location: [facility] CATH LAB;  Service: Cardiovascular;;  . ENDARTERECTOMY FEMORAL Left 11/02/2013   Procedure: RESECTION LEFT COMMON FEMORAL ARTERY AND INSERTION OF INTERPOSITION 20mm HEMASHIELD GRAFT FROM LEFT EXTERNAL  ILIAC ARTERY TO LEFT PROFUNDA  FEMORIS ARTERY;  Surgeon: Mal Misty, MD;  Location: [facility];  Service: Vascular;  Laterality: Left;  . EYE SURGERY Right 2017  . FRACTURE SURGERY Right Jul 07, 2014   Right Foot-  Pt.[place]  at Home  by Heat duct  . ILIAC ARTERY STENT Left 08/31/2013  . LAD stent    . LEFT HEART CATHETERIZATION WITH CORONARY ANGIOGRAM N/A 04/23/2012   Procedure: LEFT HEART CATHETERIZATION WITH CORONARY ANGIOGRAM;  Surgeon: Peter M Martinique, MD;   [facility];  Service: Cardiovascular;  Laterality: N/A;  . LOWER EXTREMITY ANGIOGRAM Left 08/31/2013   Procedure: LOWER EXTREMITY ANGIOGRAM;  Surgeon: Wellington Hampshire, MD;  Location: [facility] CATH LAB;  Service: Cardiovascular;  Laterality: Left;  . PERCUTANEOUS STENT INTERVENTION Left 08/31/2013   Procedure: PERCUTANEOUS STENT INTERVENTION;  Surgeon: Wellington Hampshire, MD;  Location: [facility] CATH LAB;  Service: Cardiovascular;  Laterality: Left;  Common Iliac artery  . PERIPHERAL VASCULAR CATHETERIZATION N/A 03/19/2016   Procedure: Abdominal Aortogram w/Lower Extremity;  Surgeon: Wellington Hampshire, MD;  Location: [facility] CV LAB;  Service: Cardiovascular;  Laterality: N/A;  . PERIPHERAL VASCULAR CATHETERIZATION  03/19/2016   Procedure: Peripheral Vascular Balloon Angioplasty-CFA Left;  Surgeon: Wellington Hampshire, MD;  Location: [facility] CV LAB;  Service: Cardiovascular;;  . TUMOR REMOVAL     tumor removed from O[place]     Current Outpatient Prescriptions  Medication Sig Dispense Refill  . albuterol (PROVENTIL HFA) 108 (90 Base) MCG/ACT inhaler INHALE ONE TO TWO PUFFS BY MOUTH EVERY 6 HOURS AS NEEDED FOR WHEEZING OR FOR SHORTNESS OF BREATH 18 g 3  . amLODipine (NORVASC) 10 MG tablet Take 1 tablet (10 mg total) by mouth daily. 90 tablet 2  . Ascorbic Acid (VITAMIN C) 1000 MG tablet Take 1,000 mg by mouth 2 (two) times daily.    Marland Kitchen aspirin EC 81 MG tablet Take 81 mg by mouth daily.     . Calcium Carbonate-Vit D-Min (CALTRATE 600+D PLUS MINERALS) 600-800 MG-UNIT TABS Take 1 tablet by mouth 3 (three) times daily.     . clopidogrel (PLAVIX) 75 MG tablet Take 1 tablet (75 mg total) by mouth daily. 90 tablet 3  . fish oil-omega-3 fatty acids 1000 MG capsule Take 1 g by mouth every morning.    . furosemide (LASIX) 40 MG tablet TAKE ONE TABLET BY MOUTH ONCE DAILY 90 tablet 0  . hydroxypropyl methylcellulose (ISOPTO TEARS) 2.5 % ophthalmic solution Place 1 drop into both eyes 3 (three) times daily as  needed (for dry eyes).    . Insulin Pen Needle (RELION PEN NEEDLES) 29G X 12MM MISC Three times a day 1000 each 12  . lisinopril (PRINIVIL,ZESTRIL) 20 MG tablet Take 2 tablets (40 mg total) by mouth daily. (Patient taking differently: Take 20 mg by mouth 2 (two) times daily. ) 180 tablet 3  . metoprolol succinate (TOPROL-XL) 25 MG 24 hr tablet Take 3 tablets (75 mg total) by mouth daily. For total dose of 75 mg daily 270 tablet 1  . montelukast (SINGULAIR) 10 MG tablet Take 1 tablet (10 mg total) by mouth daily with breakfast. (Patient taking differently: Take 10 mg by mouth at bedtime. ) 90 tablet 2  . nitroGLYCERIN (NITROSTAT) 0.4 MG SL tablet Place 1 tablet (0.4 mg total) under the tongue every 5 (five) minutes x 3 doses as needed for chest pain (up to 3 doses). 25 tablet 4  . NOVOLOG MIX 70/30 FLEXPEN (70-30) 100 UNIT/ML FlexPen INJECT SUBCUTANEOUSLY 75  UNITS 3 TIMES DAILY WITH  MEALS 150 mL 1  . potassium chloride SA (KLOR-CON M20) 20 MEQ tablet Take 1 tablet (20 mEq total) by mouth daily. (Patient  taking differently: Take 10 mEq by mouth daily. ) 90 tablet 0  . RELION PEN NEEDLES 29G X 12MM MISC USE 1 NEEDLE TO INJECT INSULIN SUBCUTANEOUSLY 3 TIMES DAILY 50 each 5  . rosuvastatin (CRESTOR) 20 MG tablet Take 1 tablet (20 mg total) by mouth daily. (Patient taking differently: Take 20 mg by mouth every evening. ) 90 tablet 3   No current facility-administered medications for this visit.     Allergies:   Tape    Social History:  The patient  reports that she quit smoking about 15 years ago. Her smoking use included Cigarettes. She started smoking about 51 years ago. She has a 36.00 pack-year smoking history. She has never used smokeless tobacco. She reports that she does not drink alcohol or use drugs.   Family History:  The patient's family history includes Alcohol abuse in her brother; Diabetes in her brother, daughter, maternal grandmother, mother, and son; Heart attack in her maternal  grandfather; Heart disease in her brother; Hyperlipidemia in her mother and son; Hypertension in her daughter, mother, and son; Other in her sister and sister; Peripheral vascular disease in her mother.    ROS:  Please see the history of present illness.   Otherwise, review of systems are positive for none.   All other systems are reviewed and negative.    PHYSICAL EXAM: VS:  BP 140/66 (BP Location: Right Arm, Patient Position: Sitting, Cuff Size: Normal)   Pulse 70   Ht 5\' 2"  (1.575 m)   Wt 215 lb (97.5 kg)   BMI 39.32 kg/m  , BMI Body mass index is 39.32 kg/m. GEN: Well nourished, well developed, in no acute distress  HEENT: normal  Neck: no JVD, carotid bruits, or masses Cardiac: RRR; no  rubs, or gallops , trace edema . There is 2/6 systolic ejection murmur in the aortic area Respiratory:  clear to auscultation bilaterally, normal work of breathing GI: soft, nontender, nondistended, + BS MS: no deformity or atrophy  Skin: warm and dry, no rash Neuro:  Strength and sensation are intact Psych: euthymic mood, full affect Vascular: Femoral pulses are +1 bilaterally. Distal pulses are not palpable.No groin hematoma  EKG:  EKG is not ordered today.    Recent Labs: 01/21/2016: ALT 22 03/11/2016: BUN 15; Creat 0.78; Hemoglobin 13.7; Platelets 320; Potassium 4.4; Sodium 138    Lipid Panel    Component Value Date/Time   CHOL 173 01/21/2016 1517   TRIG 155 (H) 01/21/2016 1517   TRIG 135 09/16/2012 1322   HDL 65 01/21/2016 1517   HDL 60 09/16/2012 1322   CHOLHDL 2.7 01/21/2016 1517   CHOLHDL 3.2 05/06/2012 1713   VLDL 32 05/06/2012 1713   LDLCALC 77 01/21/2016 1517   LDLCALC 92 09/16/2012 1322      Wt Readings from Last 3 Encounters:  04/01/16 215 lb (97.5 kg)  03/19/16 210 lb (95.3 kg)  03/11/16 214 lb 12.8 oz (97.4 kg)       No flowsheet data found.    ASSESSMENT AND PLAN:  1.  Peripheral arterial disease:  Doing well after recent drug-coated balloon  angioplasty on the left common femoral artery just above the anastomosis of the bypass to the profunda. ABI today 0.66 on the left and 0.64 on the right. Duplex showed normal velocities affecting the left common femoral artery. Her left SFA is known to be occluded. Continue medical therapy and repeat vascular studies in 6 months.  2. Coronary artery disease involving native coronary arteries.  Cardiac workup including stress test and echocardiogram were overall unremarkable. She reports improvement in symptoms.  3. Essential hypertension: Blood pressure is reasonably controlled.  4. Hyperlipidemia: Continue treatment with rosuvastatin with a target LDL of less than 70. Most recent LDL was 77 which is close to target.    Disposition:   FU with me in 6  month  Signed,  [name], MD  04/01/2016 10:31 AM    [place]

## 2016-04-01 NOTE — Patient Instructions (Signed)
Medication Instructions:  Your physician recommends that you continue on your current medications as directed. Please refer to the Current Medication list given to you today.  Labwork: No new orders.   Testing/Procedures: Your physician has requested that you have an ankle brachial index (ABI) in 6 MONTHS. During this test an ultrasound and blood pressure cuff are used to evaluate the arteries that supply the arms and legs with blood. Allow thirty minutes for this exam. There are no restrictions or special instructions.  Your physician has requested that you have a LEFT lower extremity arterial duplex in 6 MONTHS. This test is an ultrasound of the arteries in the legs. It looks at arterial blood flow in the legs. Allow one hour for Lower Arterial scans. There are no restrictions or special instructions  Follow-Up: Your physician wants you to follow-up in: 6 MONTHS with Dr Fletcher [name].  You will receive a reminder letter in the mail two months in advance. If you don't receive a letter, please call our office to schedule the follow-up appointment.   Any Other Special Instructions Will Be Listed Below (If Applicable).     If you need a refill on your cardiac medications before your next appointment, please call your pharmacy.

## 2016-04-09 ENCOUNTER — Other Ambulatory Visit: Payer: Self-pay | Admitting: Family

## 2016-04-24 ENCOUNTER — Ambulatory Visit (INDEPENDENT_AMBULATORY_CARE_PROVIDER_SITE_OTHER): Payer: Medicare Other | Admitting: Family

## 2016-04-24 ENCOUNTER — Encounter: Payer: Self-pay | Admitting: Family

## 2016-04-24 VITALS — BP 126/71 | HR 71 | Temp 97.0°F | Ht 62.0 in | Wt 213.0 lb

## 2016-04-24 DIAGNOSIS — R109 Unspecified abdominal pain: Secondary | ICD-10-CM | POA: Diagnosis not present

## 2016-04-24 DIAGNOSIS — I5032 Chronic diastolic (congestive) heart failure: Secondary | ICD-10-CM | POA: Diagnosis not present

## 2016-04-24 DIAGNOSIS — E1151 Type 2 diabetes mellitus with diabetic peripheral angiopathy without gangrene: Secondary | ICD-10-CM

## 2016-04-24 DIAGNOSIS — I2510 Atherosclerotic heart disease of native coronary artery without angina pectoris: Secondary | ICD-10-CM | POA: Diagnosis not present

## 2016-04-24 DIAGNOSIS — E782 Mixed hyperlipidemia: Secondary | ICD-10-CM | POA: Diagnosis not present

## 2016-04-24 DIAGNOSIS — I739 Peripheral vascular disease, unspecified: Secondary | ICD-10-CM | POA: Diagnosis not present

## 2016-04-24 DIAGNOSIS — I10 Essential (primary) hypertension: Secondary | ICD-10-CM | POA: Diagnosis not present

## 2016-04-24 DIAGNOSIS — N952 Postmenopausal atrophic vaginitis: Secondary | ICD-10-CM | POA: Diagnosis not present

## 2016-04-24 LAB — MICROSCOPIC EXAMINATION
Bacteria, UA: NONE SEEN
RBC, UA: NONE SEEN /hpf (ref 0–?)
Renal Epithel, UA: NONE SEEN /hpf

## 2016-04-24 LAB — LIPID PANEL
CHOL/HDL RATIO: 2.7 ratio (ref 0.0–4.4)
CHOLESTEROL TOTAL: 171 mg/dL (ref 100–199)
HDL: 63 mg/dL (ref >39)
LDL CALC: 70 mg/dL (ref 0–99)
Triglycerides: 190 mg/dL — ABNORMAL HIGH (ref 0–149)
VLDL Cholesterol Cal: 38 mg/dL (ref 5–40)

## 2016-04-24 LAB — CMP14+EGFR
ALBUMIN: 4.3 g/dL (ref 3.6–4.8)
ALK PHOS: 57 IU/L (ref 39–117)
ALT: 24 IU/L (ref 0–32)
AST: 19 IU/L (ref 0–40)
Albumin/Globulin Ratio: 1.1 — ABNORMAL LOW (ref 1.2–2.2)
BUN / CREAT RATIO: 14 (ref 12–28)
BUN: 11 mg/dL (ref 8–27)
Bilirubin Total: <0.2 mg/dL (ref 0.0–1.2)
CO2: 30 mmol/L — AB (ref 18–29)
CREATININE: 0.8 mg/dL (ref 0.57–1.00)
Calcium: 9.9 mg/dL (ref 8.7–10.3)
Chloride: 94 mmol/L — ABNORMAL LOW (ref 96–106)
GFR calc non Af Amer: 78 mL/min/{1.73_m2} (ref >59)
GFR, EST AFRICAN AMERICAN: 89 mL/min/{1.73_m2} (ref >59)
GLOBULIN, TOTAL: 3.8 g/dL (ref 1.5–4.5)
Glucose: 93 mg/dL (ref 65–99)
Potassium: 4.8 mmol/L (ref 3.5–5.2)
SODIUM: 139 mmol/L (ref 134–144)
Total Protein: 8.1 g/dL (ref 6.0–8.5)

## 2016-04-24 LAB — URINALYSIS, COMPLETE
BILIRUBIN UA: NEGATIVE
Glucose, UA: NEGATIVE
KETONES UA: NEGATIVE
LEUKOCYTES UA: NEGATIVE
Nitrite, UA: NEGATIVE
PROTEIN UA: NEGATIVE
RBC UA: NEGATIVE
SPEC GRAV UA: 1.015 (ref 1.005–1.030)
Urobilinogen, Ur: 0.2 mg/dL (ref 0.2–1.0)
pH, UA: 7 (ref 5.0–7.5)

## 2016-04-24 LAB — BAYER DCA HB A1C WAIVED: HB A1C: 8.8 % — AB (ref <7.0)

## 2016-04-24 MED ORDER — FUROSEMIDE 40 MG PO TABS: 40.0000 mg | ORAL_TABLET | Freq: Every day | ORAL | 1 refills | Status: DC

## 2016-04-24 NOTE — Addendum Note (Signed)
Addended by: Evelina Dun A on: 04/24/2016 11:44 AM   Modules accepted: Orders

## 2016-04-24 NOTE — Progress Notes (Signed)
Subjective:    Patient ID: Leah Ferrell, female    DOB: 10/20/1950, 66 y.o.   MRN: [id number]  Pt presents to the office today for chronic follow up. Pt has CHF, CAD and hx MI and is followed by Cardiologists every 6 months. PT is followed by Vascular every 6 months for PVD and PAD. PT states these are stable. PT complaining of right flank pain that started one week ago that is becoming worse. PT denies any urinary symptoms at this time.  Diabetes  She presents for her follow-up diabetic visit. She has type 2 diabetes mellitus. Her disease course has been stable. There are no hypoglycemic associated symptoms. Pertinent negatives for hypoglycemia include no headaches. Associated symptoms include fatigue and foot paresthesias. Pertinent negatives for diabetes include no blurred vision, no foot ulcerations and no visual change. There are no hypoglycemic complications. Diabetic complications include heart disease, peripheral neuropathy and PVD. Pertinent negatives for diabetic complications include no CVA or nephropathy. Risk factors for coronary artery disease include dyslipidemia, diabetes mellitus, hypertension, obesity and post-menopausal. Current diabetic treatment includes insulin injections and oral agent (dual therapy). She is compliant with treatment all of the time. Her weight is stable. She is following a generally healthy diet. Her breakfast blood glucose range is generally 130-140 mg/dl. An ACE inhibitor/angiotensin II receptor blocker is being taken. She sees a podiatrist.Eye exam is current.  Hypertension  This is a chronic problem. The current episode started more than 1 year ago. The problem has been resolved since onset. The problem is controlled. Associated symptoms include malaise/fatigue and peripheral edema ("if I stand too long"). Pertinent negatives include no blurred vision, headaches, palpitations or shortness of breath. Risk factors for coronary artery disease include diabetes  mellitus, dyslipidemia, family history, post-menopausal state and obesity. Past treatments include beta blockers, calcium channel blockers and ACE inhibitors. The current treatment provides moderate improvement. Hypertensive end-organ damage includes CAD/MI and PVD. There is no history of CVA or heart failure. There is no history of a thyroid problem.  Hyperlipidemia  This is a chronic problem. The current episode started more than 1 year ago. The problem is controlled. Recent lipid tests were reviewed and are normal. Exacerbating diseases include diabetes and obesity. She has no history of hypothyroidism. Factors aggravating her hyperlipidemia include beta blockers. Associated symptoms include leg pain. Pertinent negatives include no shortness of breath. Current antihyperlipidemic treatment includes statins. The current treatment provides moderate improvement of lipids. Compliance problems include adherence to diet and medication cost.  Risk factors for coronary artery disease include diabetes mellitus, dyslipidemia, family history, hypertension, obesity and post-menopausal.  Vaginal Atrophy/PMB PT had an endometrial polyp removed that was negative. Stable.     Review of Systems  Constitutional: Positive for fatigue and malaise/fatigue.  HENT: Negative.   Eyes: Negative.  Negative for blurred vision.  Respiratory: Negative.  Negative for shortness of breath.   Cardiovascular: Negative for palpitations.  Gastrointestinal: Negative.   Endocrine: Negative.   Genitourinary: Negative.   Musculoskeletal: Negative.   Neurological: Negative.  Negative for headaches.  Hematological: Negative.   Psychiatric/Behavioral: Negative.   All other systems reviewed and are negative.      Objective:   Physical Exam  Constitutional: She is oriented to person, place, and time. She appears well-developed and well-nourished. No distress.  HENT:  Head: Normocephalic and atraumatic.  Right Ear: External ear  normal.  Left Ear: External ear normal.  Nose: Nose normal.  Mouth/Throat: Oropharynx is clear and moist.  Eyes: Pupils are equal, round, and reactive to light.  Neck: Normal range of motion. Neck supple. No thyromegaly present.  Cardiovascular: Normal rate, regular rhythm, normal heart sounds and intact distal pulses.   No murmur heard. Pulmonary/Chest: Effort normal and breath sounds normal. No respiratory distress. She has no wheezes.  Abdominal: Soft. Bowel sounds are normal. There is no tenderness.  Musculoskeletal: Normal range of motion. She exhibits edema (trace in BLE). She exhibits no tenderness.  Neurological: She is alert and oriented to person, place, and time.  Skin: Skin is warm and dry.  Psychiatric: She has a normal mood and affect. Her behavior is normal. Judgment and thought content normal.  Vitals reviewed.   BP 126/71   Pulse 71   Temp 97 F (36.1 C) (Oral)   Ht 5' 2" (1.575 m)   Wt 213 lb (96.6 kg)   BMI 38.96 kg/m        Assessment & Plan:  1. Coronary artery disease involving native coronary artery of native heart without angina pectoris - CMP14+EGFR  2. Diabetes mellitus with peripheral vascular disease ([place]) - CMP14+EGFR - Bayer DCA Hb A1c Waived  3. Essential hypertension - CMP14+EGFR  4. PAD (peripheral artery disease) ([facility]) - CMP14+EGFR - Lipid panel  5. PVD (peripheral vascular disease) ([facility]) - CMP14+EGFR - Lipid panel  6. Mixed hyperlipidemia - CMP14+EGFR - Lipid panel  7. Chronic diastolic heart failure ([facility]) - CMP14+EGFR  8. Vaginal atrophy - CMP14+EGFR  9. Right flank pain - CMP14+EGFR - Urinalysis, Complete   Continue all meds Labs pending Health Maintenance reviewed- Pt to schedule mammogram  Diet and exercise encouraged RTO 3 months   Evelina Dun, FNP

## 2016-04-24 NOTE — Patient Instructions (Signed)
Health Maintenance, Female Adopting a healthy lifestyle and getting preventive care can go a long way to promote health and wellness. Talk with your health care provider about what schedule of regular examinations is right for you. This is a good chance for you to check in with your provider about disease prevention and staying healthy. In between checkups, there are plenty of things you can do on your own. Experts have done a lot of research about which lifestyle changes and preventive measures are most likely to keep you healthy. Ask your health care provider for more information. Weight and diet Eat a healthy diet  Be sure to include plenty of vegetables, fruits, low-fat dairy products, and lean protein.  Do not eat a lot of foods high in solid fats, added sugars, or salt.  Get regular exercise. This is one of the most important things you can do for your health.  Most adults should exercise for at least 150 minutes each week. The exercise should increase your heart rate and make you sweat (moderate-intensity exercise).  Most adults should also do strengthening exercises at least twice a week. This is in addition to the moderate-intensity exercise. Maintain a healthy weight  Body mass index (BMI) is a measurement that can be used to identify possible weight problems. It estimates body fat based on height and weight. Your health care provider can help determine your BMI and help you achieve or maintain a healthy weight.  For females 76 years of age and older:  A BMI below 18.5 is considered underweight.  A BMI of 18.5 to 24.9 is normal.  A BMI of 25 to 29.9 is considered overweight.  A BMI of 30 and above is considered obese. Watch levels of cholesterol and blood lipids  You should start having your blood tested for lipids and cholesterol at 65 years of age, then have this test every 5 years.  You may need to have your cholesterol levels checked more often if:  Your lipid or  cholesterol levels are high.  You are older than 65 years of age.  You are at high risk for heart disease. Cancer screening Lung Cancer  Lung cancer screening is recommended for adults 64-42 years old who are at high risk for lung cancer because of a history of smoking.  A yearly low-dose CT scan of the lungs is recommended for people who:  Currently smoke.  Have quit within the past 15 years.  Have at least a 30-pack-year history of smoking. A pack year is smoking an average of one pack of cigarettes a day for 1 year.  Yearly screening should continue until it has been 15 years since you quit.  Yearly screening should stop if you develop a health problem that would prevent you from having lung cancer treatment. Breast Cancer  Practice breast self-awareness. This means understanding how your breasts normally appear and feel.  It also means doing regular breast self-exams. Let your health care provider know about any changes, no matter how small.  If you are in your 20s or 30s, you should have a clinical breast exam (CBE) by a health care provider every 1-3 years as part of a regular health exam.  If you are 34 or older, have a CBE every year. Also consider having a breast X-ray (mammogram) every year.  If you have a family history of breast cancer, talk to your health care provider about genetic screening.  If you are at high risk for breast cancer, talk  to your health care provider about having an MRI and a mammogram every year.  Breast cancer gene (BRCA) assessment is recommended for women who have family members with BRCA-related cancers. BRCA-related cancers include:  Breast.  Ovarian.  Tubal.  Peritoneal cancers.  Results of the assessment will determine the need for genetic counseling and BRCA1 and BRCA2 testing. Cervical Cancer  Your health care provider may recommend that you be screened regularly for cancer of the pelvic organs (ovaries, uterus, and vagina).  This screening involves a pelvic examination, including checking for microscopic changes to the surface of your cervix (Pap test). You may be encouraged to have this screening done every 3 years, beginning at age 24.  For women ages 66-65, health care providers may recommend pelvic exams and Pap testing every 3 years, or they may recommend the Pap and pelvic exam, combined with testing for human papilloma virus (HPV), every 5 years. Some types of HPV increase your risk of cervical cancer. Testing for HPV may also be done on women of any age with unclear Pap test results.  Other health care providers may not recommend any screening for nonpregnant women who are considered low risk for pelvic cancer and who do not have symptoms. Ask your health care provider if a screening pelvic exam is right for you.  If you have had past treatment for cervical cancer or a condition that could lead to cancer, you need Pap tests and screening for cancer for at least 20 years after your treatment. If Pap tests have been discontinued, your risk factors (such as having a new sexual partner) need to be reassessed to determine if screening should resume. Some women have medical problems that increase the chance of getting cervical cancer. In these cases, your health care provider may recommend more frequent screening and Pap tests. Colorectal Cancer  This type of cancer can be detected and often prevented.  Routine colorectal cancer screening usually begins at 65 years of age and continues through 65 years of age.  Your health care provider may recommend screening at an earlier age if you have risk factors for colon cancer.  Your health care provider may also recommend using home test kits to check for hidden blood in the stool.  A small camera at the end of a tube can be used to examine your colon directly (sigmoidoscopy or colonoscopy). This is done to check for the earliest forms of colorectal cancer.  Routine  screening usually begins at age 41.  Direct examination of the colon should be repeated every 5-10 years through 65 years of age. However, you may need to be screened more often if early forms of precancerous polyps or small growths are found. Skin Cancer  Check your skin from head to toe regularly.  Tell your health care provider about any new moles or changes in moles, especially if there is a change in a mole's shape or color.  Also tell your health care provider if you have a mole that is larger than the size of a pencil eraser.  Always use sunscreen. Apply sunscreen liberally and repeatedly throughout the day.  Protect yourself by wearing long sleeves, pants, a wide-brimmed hat, and sunglasses whenever you are outside. Heart disease, diabetes, and high blood pressure  High blood pressure causes heart disease and increases the risk of stroke. High blood pressure is more likely to develop in:  People who have blood pressure in the high end of the normal range (130-139/85-89 mm Hg).  People who are overweight or obese.  People who are African American.  If you are 59-24 years of age, have your blood pressure checked every 3-5 years. If you are 34 years of age or older, have your blood pressure checked every year. You should have your blood pressure measured twice-once when you are at a hospital or clinic, and once when you are not at a hospital or clinic. Record the average of the two measurements. To check your blood pressure when you are not at a hospital or clinic, you can use:  An automated blood pressure machine at a pharmacy.  A home blood pressure monitor.  If you are between 29 years and 60 years old, ask your health care provider if you should take aspirin to prevent strokes.  Have regular diabetes screenings. This involves taking a blood sample to check your fasting blood sugar level.  If you are at a normal weight and have a low risk for diabetes, have this test once  every three years after 66 years of age.  If you are overweight and have a high risk for diabetes, consider being tested at a younger age or more often. Preventing infection Hepatitis B  If you have a higher risk for hepatitis B, you should be screened for this virus. You are considered at high risk for hepatitis B if:  You were born in a country where hepatitis B is common. Ask your health care provider which countries are considered high risk.  Your parents were born in a high-risk country, and you have not been immunized against hepatitis B (hepatitis B vaccine).  You have HIV or AIDS.  You use needles to inject street drugs.  You live with someone who has hepatitis B.  You have had sex with someone who has hepatitis B.  You get hemodialysis treatment.  You take certain medicines for conditions, including cancer, organ transplantation, and autoimmune conditions. Hepatitis C  Blood testing is recommended for:  Everyone born from [birth year] through 1964.  Anyone with known risk factors for hepatitis C. Sexually transmitted infections (STIs)  You should be screened for sexually transmitted infections (STIs) including gonorrhea and chlamydia if:  You are sexually active and are younger than 65 years of age.  You are older than 65 years of age and your health care provider tells you that you are at risk for this type of infection.  Your sexual activity has changed since you were last screened and you are at an increased risk for chlamydia or gonorrhea. Ask your health care provider if you are at risk.  If you do not have HIV, but are at risk, it may be recommended that you take a prescription medicine daily to prevent HIV infection. This is called pre-exposure prophylaxis (PrEP). You are considered at risk if:  You are sexually active and do not regularly use condoms or know the HIV status of your partner(s).  You take drugs by injection.  You are sexually active with a partner  who has HIV. Talk with your health care provider about whether you are at high risk of being infected with HIV. If you choose to begin PrEP, you should first be tested for HIV. You should then be tested every 3 months for as long as you are taking PrEP. Pregnancy  If you are premenopausal and you may become pregnant, ask your health care provider about preconception counseling.  If you may become pregnant, take 400 to 800 micrograms (mcg) of folic acid  every day.  If you want to prevent pregnancy, talk to your health care provider about birth control (contraception). Osteoporosis and menopause  Osteoporosis is a disease in which the bones lose minerals and strength with aging. This can result in serious bone fractures. Your risk for osteoporosis can be identified using a bone density scan.  If you are 4 years of age or older, or if you are at risk for osteoporosis and fractures, ask your health care provider if you should be screened.  Ask your health care provider whether you should take a calcium or vitamin D supplement to lower your risk for osteoporosis.  Menopause may have certain physical symptoms and risks.  Hormone replacement therapy may reduce some of these symptoms and risks. Talk to your health care provider about whether hormone replacement therapy is right for you. Follow these instructions at home:  Schedule regular health, dental, and eye exams.  Stay current with your immunizations.  Do not use any tobacco products including cigarettes, chewing tobacco, or electronic cigarettes.  If you are pregnant, do not drink alcohol.  If you are breastfeeding, limit how much and how often you drink alcohol.  Limit alcohol intake to no more than 1 drink per day for nonpregnant women. One drink equals 12 ounces of beer, 5 ounces of wine, or 1 ounces of hard liquor.  Do not use street drugs.  Do not share needles.  Ask your health care provider for help if you need support  or information about quitting drugs.  Tell your health care provider if you often feel depressed.  Tell your health care provider if you have ever been abused or do not feel safe at home. This information is not intended to replace advice given to you by your health care provider. Make sure you discuss any questions you have with your health care provider. Document Released: 08/19/2010 Document Revised: 07/12/2015 Document Reviewed: 11/07/2014 [employer] Interactive Patient Education  2017 [employer].

## 2016-05-02 ENCOUNTER — Ambulatory Visit: Payer: 59 | Admitting: Vascular Surgery

## 2016-05-03 ENCOUNTER — Other Ambulatory Visit: Payer: Self-pay | Admitting: Family

## 2016-05-03 DIAGNOSIS — E875 Hyperkalemia: Secondary | ICD-10-CM

## 2016-05-19 ENCOUNTER — Other Ambulatory Visit: Payer: Self-pay | Admitting: Family

## 2016-05-19 DIAGNOSIS — E1151 Type 2 diabetes mellitus with diabetic peripheral angiopathy without gangrene: Secondary | ICD-10-CM

## 2016-06-11 ENCOUNTER — Encounter: Payer: Self-pay | Admitting: Cardiology

## 2016-06-11 ENCOUNTER — Ambulatory Visit (INDEPENDENT_AMBULATORY_CARE_PROVIDER_SITE_OTHER): Payer: Medicare Other | Admitting: Cardiology

## 2016-06-11 VITALS — BP 160/54 | HR 64 | Ht 62.0 in | Wt 215.0 lb

## 2016-06-11 DIAGNOSIS — E782 Mixed hyperlipidemia: Secondary | ICD-10-CM

## 2016-06-11 DIAGNOSIS — I739 Peripheral vascular disease, unspecified: Secondary | ICD-10-CM

## 2016-06-11 DIAGNOSIS — I2510 Atherosclerotic heart disease of native coronary artery without angina pectoris: Secondary | ICD-10-CM

## 2016-06-11 DIAGNOSIS — I5032 Chronic diastolic (congestive) heart failure: Secondary | ICD-10-CM | POA: Diagnosis not present

## 2016-06-11 DIAGNOSIS — I10 Essential (primary) hypertension: Secondary | ICD-10-CM | POA: Diagnosis not present

## 2016-06-11 NOTE — Patient Instructions (Signed)

## 2016-06-11 NOTE — Progress Notes (Signed)
Clinical Summary Ms. Langstaff is a 66 y.o.female seen today for follow up of the following medical problems.   1. CAD  - prior NSTEMI 04/2012 with DES to LAD  - 07/21/2013 cath at [facility] after abnormal nuclear stress test. Succesfull pci to LAD with DES.  - 03/2016 echo LVEF 65-70%, no WMAs.  - Jan 2018 nuclear stress: no ischemia.   - no recent chest pain. Can have some SOB with very high levels of activity.   2. PAD  - followed by Dr Fletcher [name]. Recent angioplasty of left CFA.   3. Chronic diastolic heart failure  - occasional LE edema. Improves with prn lasix.   4. HTN  - does not check regularly  - compliant with meds. Last few visits with other providers has been at goal.   5. Hyperlipidemia  - compliant with statin  04/2016 TC 171 TG 190 HDL 63 LDL 70     SH: moving from [place] to [place] in late May, a house with 3 acres.    Past Medical History:  Diagnosis Date  . CAD (coronary artery disease)    a. 04/2012 NSTEMI: s/p DES to LAD.  Marland Kitchen Chronic diastolic CHF (congestive heart failure) ([place])   . Diabetes mellitus without complication ([place])   . Dysrhythmia   . Environmental and seasonal allergies   . Hyperlipidemia   . Hypertension   . Leukocytosis   . Morbid obesity ([place])   . NSTEMI (non-ST elevated myocardial infarction) ([place]) 04/27/2012  . PONV (postoperative nausea and vomiting)   . PVD (peripheral vascular disease) ([place])    a. 04/2012 ABI: R 0.49, L 0.39. Angiography 06/14: Significant ostial left common iliac artery stenosis extending into the distal aorta, severe left common femoral artery stenosis, bilateral SFA occlusion with heavy calcifications. Functionally, one-vessel runoff bilaterally below the knee  . Shortness of breath      Allergies  Allergen Reactions  . Tape Rash     Current Outpatient Prescriptions  Medication Sig Dispense Refill  . albuterol (PROVENTIL HFA) 108 (90 Base) MCG/ACT inhaler INHALE ONE TO TWO  PUFFS BY MOUTH EVERY 6 HOURS AS NEEDED FOR WHEEZING OR FOR SHORTNESS OF BREATH 18 g 3  . amLODipine (NORVASC) 10 MG tablet Take 1 tablet (10 mg total) by mouth daily. 90 tablet 2  . Ascorbic Acid (VITAMIN C) 1000 MG tablet Take 1,000 mg by mouth 2 (two) times daily.    Marland Kitchen aspirin EC 81 MG tablet Take 81 mg by mouth daily.     . Calcium Carbonate-Vit D-Min (CALTRATE 600+D PLUS MINERALS) 600-800 MG-UNIT TABS Take 1 tablet by mouth 3 (three) times daily.     . clopidogrel (PLAVIX) 75 MG tablet Take 1 tablet (75 mg total) by mouth daily. 90 tablet 3  . fish oil-omega-3 fatty acids 1000 MG capsule Take 1 g by mouth every morning.    . furosemide (LASIX) 40 MG tablet Take 1 tablet (40 mg total) by mouth daily. 90 tablet 1  . hydroxypropyl methylcellulose (ISOPTO TEARS) 2.5 % ophthalmic solution Place 1 drop into both eyes 3 (three) times daily as needed (for dry eyes).    . Insulin Pen Needle (RELION PEN NEEDLES) 29G X 12MM MISC Three times a day 1000 each 12  . lisinopril (PRINIVIL,ZESTRIL) 20 MG tablet Take 2 tablets (40 mg total) by mouth daily. (Patient taking differently: Take 20 mg by mouth 2 (two) times daily. ) 180 tablet 3  . metoprolol succinate (TOPROL-XL) 25 MG  24 hr tablet Take 3 tablets (75 mg total) by mouth daily. For total dose of 75 mg daily 270 tablet 1  . montelukast (SINGULAIR) 10 MG tablet Take 1 tablet (10 mg total) by mouth daily with breakfast. (Patient taking differently: Take 10 mg by mouth at bedtime. ) 90 tablet 2  . nitroGLYCERIN (NITROSTAT) 0.4 MG SL tablet Place 1 tablet (0.4 mg total) under the tongue every 5 (five) minutes x 3 doses as needed for chest pain (up to 3 doses). 25 tablet 4  . NOVOLOG MIX 70/30 FLEXPEN (70-30) 100 UNIT/ML FlexPen INJECT SUBCUTANEOUSLY 75  UNITS 3 TIMES DAILY WITH  MEALS 210 mL 2  . potassium chloride SA (K-DUR,KLOR-CON) 20 MEQ tablet TAKE 1 TABLET BY MOUTH  DAILY 90 tablet 1  . RELION PEN NEEDLES 29G X 12MM MISC USE 1 NEEDLE TO INJECT INSULIN  SUBCUTANEOUSLY 3 TIMES DAILY 50 each 5  . rosuvastatin (CRESTOR) 20 MG tablet Take 1 tablet (20 mg total) by mouth daily. (Patient taking differently: Take 20 mg by mouth every evening. ) 90 tablet 3   No current facility-administered medications for this visit.      Past Surgical History:  Procedure Laterality Date  . ABDOMINAL AORTAGRAM N/A 07/21/2012   Procedure: ABDOMINAL Maxcine Ham;  Surgeon: Wellington Hampshire, MD;  Location: [facility] CATH LAB;  Service: Cardiovascular;  Laterality: N/A;  . CARDIAC CATHETERIZATION     stents X 2  . CORONARY ANGIOGRAM  04/27/2012   Procedure: CORONARY ANGIOGRAM;  Surgeon: Thayer Headings, MD;  Location: [facility] CATH LAB;  Service: Cardiovascular;;  . ENDARTERECTOMY FEMORAL Left 11/02/2013   Procedure: RESECTION LEFT COMMON FEMORAL ARTERY AND INSERTION OF INTERPOSITION 74mm HEMASHIELD GRAFT FROM LEFT EXTERNAL  ILIAC ARTERY TO LEFT PROFUNDA  FEMORIS ARTERY;  Surgeon: Mal Misty, MD;  Location: [place];  Service: Vascular;  Laterality: Left;  . EYE SURGERY Right 2017  . FRACTURE SURGERY Right Jul 07, 2014   Right Foot-  Pt.fell  at Home  by Heat duct  . ILIAC ARTERY STENT Left 08/31/2013  . LAD stent    . LEFT HEART CATHETERIZATION WITH CORONARY ANGIOGRAM N/A 04/23/2012   Procedure: LEFT HEART CATHETERIZATION WITH CORONARY ANGIOGRAM;  Surgeon: Peter M Martinique, MD;  Location: [facility] CATH LAB;  Service: Cardiovascular;  Laterality: N/A;  . LOWER EXTREMITY ANGIOGRAM Left 08/31/2013   Procedure: LOWER EXTREMITY ANGIOGRAM;  Surgeon: Wellington Hampshire, MD;  Location: [place] CATH LAB;  Service: Cardiovascular;  Laterality: Left;  . PERCUTANEOUS STENT INTERVENTION Left 08/31/2013   Procedure: PERCUTANEOUS STENT INTERVENTION;  Surgeon: Wellington Hampshire, MD;  Location: [place] CATH LAB;  Service: Cardiovascular;  Laterality: Left;  Common Iliac artery  . PERIPHERAL VASCULAR CATHETERIZATION N/A 03/19/2016   Procedure: Abdominal Aortogram w/Lower Extremity;  Surgeon: Wellington Hampshire, MD;  Location:  [place] CV LAB;  Service: Cardiovascular;  Laterality: N/A;  . PERIPHERAL VASCULAR CATHETERIZATION  03/19/2016   Procedure: Peripheral Vascular Balloon Angioplasty-CFA Left;  Surgeon: Wellington Hampshire, MD;  Location: [place] CV LAB;  Service: Cardiovascular;;  . 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 Tape Rash      Family History  Problem Relation Age of Onset  . Diabetes Mother   . Hyperlipidemia Mother   . Hypertension Mother   . Peripheral vascular disease Mother   . Diabetes Maternal Grandmother   . Heart attack Maternal Grandfather   . Heart disease Brother   .  Other Sister     drowned  . Diabetes Brother   . Alcohol abuse Brother   . Other Sister     drowned  . Diabetes Daughter     borderline  . Hypertension Daughter   . Diabetes Son   . Hypertension Son   . Hyperlipidemia Son      Social History Ms. Siragusa reports that she quit smoking about 15 years ago. Her smoking use included Cigarettes. She started smoking about 52 years ago. She has a 36.00 pack-year smoking history. She has never used smokeless tobacco. Ms. Beiser reports that she does not drink alcohol.   Review of Systems CONSTITUTIONAL: No weight loss, fever, chills, weakness or fatigue.  HEENT: Eyes: No visual loss, blurred vision, double vision or yellow sclerae.No hearing loss, sneezing, congestion, runny nose or sore throat.  SKIN: No rash or itching.  CARDIOVASCULAR: per hpi RESPIRATORY: No shortness of breath, cough or sputum.  GASTROINTESTINAL: No anorexia, nausea, vomiting or diarrhea. No abdominal pain or blood.  GENITOURINARY: No burning on urination, no polyuria NEUROLOGICAL: No headache, dizziness, syncope, paralysis, ataxia, numbness or tingling in the extremities. No change in bowel or bladder control.  MUSCULOSKELETAL: No muscle, back pain, joint pain or stiffness.  LYMPHATICS: No enlarged nodes. No history of splenectomy.    PSYCHIATRIC: No history of depression or anxiety.  ENDOCRINOLOGIC: No reports of sweating, cold or heat intolerance. No polyuria or polydipsia.  Marland Kitchen   Physical Examination Vitals:   06/11/16 1612  BP: (!) 160/54  Pulse: 64   Vitals:   06/11/16 1612  Weight: 215 lb (97.5 kg)  Height: 5\' 2"  (1.575 m)    Gen: resting comfortably, no acute distress HEENT: no scleral icterus, pupils equal round and reactive, no palptable cervical adenopathy,  CV: RRR, 2/6 systolic murmur RUSB, no jvd. RIght carotid bruit Resp: Clear to auscultation bilaterally GI: abdomen is soft, non-tender, non-distended, normal bowel sounds, no hepatosplenomegaly MSK: extremities are warm, no edema.  Skin: warm, no rash Neuro:  no focal deficits Psych: appropriate affect   Diagnostic Studies 04/2012 Echo: LVEF 55-60%, no WMA, mild LVH, grade I diastolic dysfunction  04/7626 ABI: Right 0.57 left 0.48. Moderate left iliac disease, distal right SFA occlusion with reconstitution at the popliteal.  04/2012 Carotid US: bilateral plaque without significant stenosis.  04/2012 Cardiac Cath PROCEDURAL FINDINGS  Hemodynamics:  AO 155/58 mean 91 mm Hg  LV 163/13 mm Hg  Coronary angiography:  Coronary dominance: right  Left mainstem: Short with shared coronary ostia for the LAD and LCX.  Left anterior descending (LAD): diffuse 30-40% proximal. 90% focal lesion after the first diagonal.  Left circumflex (LCx): <20% proximal and mid. 80% very small distal OM.  Right coronary artery (RCA): Diffuse 20% proximal and mid vessel disease. Moderate calcification.  Left ventriculography: Left ventricular systolic function is normal, LVEF is estimated at 55-65%, there is no significant mitral regurgitation  PCI Note: Following the diagnostic procedure, the decision was made to proceed with PCI. The radial sheath was upsized to a 6 [place]. Bilinta 180 mg was given orally. Weight-based bivalirudin was given for  anticoagulation. Once a therapeutic ACT was achieved, a 6 [place] XB 3.0 guide catheter was inserted. A prowater coronary guidewire was used to cross the lesion. The lesion was predilated with a 2.5 mm balloon. The lesion was then stented with a 3.0x 16 mm Promus premier stent. The stent was postdilated with a 3.25 mm noncompliant balloon. Following PCI, there was 0% residual stenosis and  TIMI-3 flow. Final angiography confirmed an excellent result. The patient tolerated the procedure well. There were no immediate procedural complications. A TR band was used for radial hemostasis. The patient was transferred to the post catheterization recovery area for further monitoring.  PCI Data:  Vessel - LAD/Segment - mid  Percent Stenosis (pre) 90%  TIMI-flow 3  Stent 3.0 x 16 mm Promus premier  Percent Stenosis (post) 0%  TIMI-flow (post) 3  Final Conclusions:  1. Single vessel obstructive CAD  2. Normal LV function.  3. Successful stenting of the mid LAD with a DES.   07/2012 Lower Extremity Angiography Hemodynamics: Central Aortic Pressure / Mean Aortic Pressure: 157/70  Findings:   Abdominal aorta: Normal in size with no aneurysm. There is mild atherosclerosis below the renal arteries. Distally above the iliac bifurcation there is a calcified plaque at the origin of the left external iliac causing 30% stenosis.   Left renal artery: Normal   Right renal artery: Normal   Celiac artery: Patent   Superior mesenteric artery: Patent   Right common iliac artery: Diffuse 20% disease.   Right internal iliac artery: Normal   Right external iliac artery: Normal   Right common femoral artery: Minor irregularities.   Right profunda femoral artery: Diffuse 20% disease proximally.   Right superficial femoral artery: Diffuse atherosclerosis throughout its course. There is 20% disease at the ostium. This is followed by 70% stenosis proximally. There is another 70% stenosis  in the midsegment followed by an occlusion distally Reconstituting in the proximal popliteal artery. This segment is about 50 mm and heavily calcified.   Right popliteal artery: 20-30% disease proximally.   Right tibial peroneal trunk: Normal   Right anterior tibial artery: Diffusely diseased proximally and seems to be occluded in the midsegment.   Right peroneal artery: Patent but diffusely diseased.   Right posterior tibial artery: Patent and the dominant vessel below the knee. It has minor irregularities.   Left common iliac artery: There is a 70-80 % heavily calcified lesion proximally.   Left internal iliac artery: Diffusely diseased.   Left external iliac artery: Minor irregularities.   Left common femoral artery: Heavily calcified with diffuse 90% disease extending into the ostium of the SFA.   Left profunda femoral artery: Normal.   Left superficial femoral artery: 70% ostial disease with heavy calcifications. Diffuse 40% disease proximally. The vessel is occluded in the midsegment with reconstitution distally via collaterals from the profunda.   Left popliteal artery: Minor irregularities.   Left tibial peroneal trunk: Minor irregularities.   Left anterior tibial artery: Patent. This is the dominant vessel.   Left peroneal artery: Patent but diffusely diseased.   Left posterior tibial artery: Occlude proximally. Conclusions: 1. Significant heavily calcified ostial left common iliac artery stenosis with plaque extending into the distal aorta.  2. Severe heavily calcified disease in the left common femoral artery.  3. Bilateral SFA occlusion with heavy calcifications.  4. Significant below the knee disease bilaterally as outlined above.  Recommendations:  The patient has diffuse and heavily calcified peripheral arterial disease. Revascularization options are somewhat difficult by endovascular means. There is heavily calcified disease in the left  common femoral artery which is not approachable by endovascular options. Endarterectomy is the best option for this. The stenosis in the left common iliac artery can be treated with stenting but will require likely stenting the distal aorta as well. Both SFAs are diffusely diseased, calcified with chronic occlusion. We'll attempt medical therapy and a walking  program for now.   11/2012 Labs: 140 K 4.2 Cl 97 Cr 0.75 BUN 9 AST 16 ALT 21 T.bili 0.2 TC 205 TG 169 HDL 70 LDL 101  01/2013 Event Monitor No symptoms, no arrythmias  06/2013 Lexiscan MPI Large reversible defect in the basal anterior, basal anteroseptal, mid anterior, mid anteroseptal, apical anteior, apical segments mild to moderate.  07/21/13 Cath Hemodynamic Measurements Pressures:  - AO 130 / 56. - LV systolic 614 LV End-diastolic 13.  Angiographic Findings: Left main: Normal Left anterior descending: There is 70% stenosis at the proximal segment, first diagonal is intermediate in size, there is a stent in mid LAD which is patent. Left circumflex: Angiographically normal Right coronary artery: There is 30% narrowing in the mid segment. Right dominant  Left ventriculogram: Not done.   INTERVENTIONAL PROCEDURE NARRATIVE:  Intra-operative Anticoagulation: Additional dose of 2500 heparin IV  Equipment - Guide Catheters: 6 [place] EBU 3 - Guide Wires: Run-through  Procedure Details: I advanced a 3.0 x 12 balloon and predilated the lesion up to 14 atmospheres for 20 seconds and then I repeated that for another 14 atmospheres for 20 seconds. Then I advanced a Promus 3.5 x 16 drug-eluting stent and deployed it at the ostium and  proximal LAD with extreme care not to involve the ostium of left circumflex. I deployed the stent at 16 atmospheres, for 45 seconds, enlarging the stent to 3.7 mm. Post deployment angiography showed excellent results no complications. I gave the patient  Effient 60 mg, removed the catheter and used  a TR band to obtain hemostasis.Marland Kitchen  Post-operative Anticoagulation: Aspirin and Effient  Complications: None  IMPRESSION: 1. Successful PCI to proximal LAD with a Promus 3.5 x 16 drug-eluting stent, postdilated to 3.7 mm. 2. Patent mid LAD stent. 3. Normal left ventricular end-diastolic pressure of 13 mm Hg. 4. Coronary angiography and percutaneous coronary intervention performed with access through the right radial artery.  PLAN: 1. Aspirin 81 mg and Effient 10 mg daily for at least one year 2. Continue medical management of coronary artery disease. 3. Due to the fact that patient has significant life style limiting Rutherford class III claudication, I will proceed with PVR and duplex study. 4. Followup with Dr. Hamilton Capri.    Jan 2018 Nuclear stress  Nuclear stress EF: 59%.  The study is normal.  This is a low risk study.  There was no ST segment deviation noted during stress.   Low risk stress nuclear study with normal perfusion and normal left ventricular regional and global systolic function.    03/2016 echo Study Conclusions  - Left ventricle: The cavity size was normal. Wall thickness was   increased in a pattern of severe LVH. Systolic function was   vigorous. The estimated ejection fraction was in the range of 65%   to 70%. Wall motion was normal; there were no regional wall   motion abnormalities. Doppler parameters are consistent with   abnormal left ventricular relaxation (grade 1 diastolic   dysfunction). Doppler parameters are consistent with high   ventricular filling pressure. - Mitral valve: Calcified annulus. Mildly thickened leaflets . - Left atrium: The atrium was moderately dilated.  Impressions:  - Vigorous LV systolic function; severe LVH; grade 1 diastolic   dysfunction with elevated LV filling pressure; sclerotic aortic   valve; moderate LAE.  Assessment and Plan  1. CAD - no current symptoms - continue current meds  2. PAD -  continue to follow with Dr Fletcher [name] and vascular  3.  Chronic diastolic HF - appears euvolemic,no recent symptoms.  - continue current meds  4. HTN -elevated in clinic, last several visits with other providers has been at [place] - contineu to monitor at this time. If persistently elevated will need med changes.   5. Hyperlipidemia - continue high dose statin   F/u 6 months    Arnoldo Lenis, M.D

## 2016-07-17 ENCOUNTER — Telehealth: Payer: Self-pay | Admitting: *Deleted

## 2016-07-17 NOTE — Telephone Encounter (Signed)
PT called. PT states her BP has been elevated in the evening. Pt states her BP will be 160"s-200"s/ 60's and when it is elevated pt told she takes half an extra metoprolol. Discussed pt should medications as prescribed and encouraged to make follow up appt to have BP checked in office.

## 2016-07-17 NOTE — Telephone Encounter (Signed)
Pt is taking all BP meds as directed Per pt, since changing BP meds, BP readings are elevated Offered pt appt but she is unable to come in today Has appt on 07/28/2016 Pt insists on speaking with Alyse Low today Please advise

## 2016-07-28 ENCOUNTER — Ambulatory Visit: Payer: 59 | Admitting: Family

## 2016-08-04 ENCOUNTER — Encounter: Payer: Self-pay | Admitting: Family

## 2016-08-04 ENCOUNTER — Ambulatory Visit (INDEPENDENT_AMBULATORY_CARE_PROVIDER_SITE_OTHER): Payer: Medicare Other | Admitting: Family

## 2016-08-04 VITALS — BP 131/57 | HR 59 | Temp 97.4°F | Ht 62.0 in | Wt 211.0 lb

## 2016-08-04 DIAGNOSIS — E782 Mixed hyperlipidemia: Secondary | ICD-10-CM | POA: Diagnosis not present

## 2016-08-04 DIAGNOSIS — I5031 Acute diastolic (congestive) heart failure: Secondary | ICD-10-CM

## 2016-08-04 DIAGNOSIS — I10 Essential (primary) hypertension: Secondary | ICD-10-CM | POA: Diagnosis not present

## 2016-08-04 DIAGNOSIS — I739 Peripheral vascular disease, unspecified: Secondary | ICD-10-CM | POA: Diagnosis not present

## 2016-08-04 DIAGNOSIS — I5032 Chronic diastolic (congestive) heart failure: Secondary | ICD-10-CM

## 2016-08-04 DIAGNOSIS — I2510 Atherosclerotic heart disease of native coronary artery without angina pectoris: Secondary | ICD-10-CM

## 2016-08-04 DIAGNOSIS — I252 Old myocardial infarction: Secondary | ICD-10-CM | POA: Diagnosis not present

## 2016-08-04 DIAGNOSIS — E1151 Type 2 diabetes mellitus with diabetic peripheral angiopathy without gangrene: Secondary | ICD-10-CM

## 2016-08-04 LAB — BAYER DCA HB A1C WAIVED: HB A1C (BAYER DCA - WAIVED): 7.9 % — ABNORMAL HIGH (ref <7.0)

## 2016-08-04 NOTE — Progress Notes (Signed)
Subjective:    Patient ID: Leah Ferrell, female    DOB: Jul 15, 1950, 66 y.o.   MRN: [id number]  Pt presents to the office today for chronic follow up. Pt has CHF, CAD and hx MI and is followed by Cardiologists every 6 months. PT is followed by Vascular  for PVD and PAD. PT states these are stable.  Hypertension  This is a chronic problem. The current episode started more than 1 year ago. The problem has been resolved since onset. The problem is controlled. Associated symptoms include peripheral edema (trace). Pertinent negatives include no blurred vision, malaise/fatigue or shortness of breath. Risk factors for coronary artery disease include diabetes mellitus, family history, dyslipidemia, obesity, post-menopausal state and sedentary lifestyle. The current treatment provides moderate improvement. Hypertensive end-organ damage includes CAD/MI and heart failure. There is no history of kidney disease or CVA.  Diabetes  She presents for her follow-up diabetic visit. She has type 2 diabetes mellitus. There are no hypoglycemic associated symptoms. Associated symptoms include foot paresthesias (At times). Pertinent negatives for diabetes include no blurred vision. There are no hypoglycemic complications. Symptoms are stable. Diabetic complications include heart disease and peripheral neuropathy. Pertinent negatives for diabetic complications include no CVA or nephropathy. Risk factors for coronary artery disease include diabetes mellitus, dyslipidemia, hypertension, obesity, post-menopausal and sedentary lifestyle. Her breakfast blood glucose range is generally 110-130 mg/dl.  Hyperlipidemia  This is a chronic problem. The current episode started more than 1 year ago. The problem is controlled. Recent lipid tests were reviewed and are normal. Exacerbating diseases include obesity. Pertinent negatives include no shortness of breath. Current antihyperlipidemic treatment includes statins. The current treatment  provides moderate improvement of lipids. Risk factors for coronary artery disease include diabetes mellitus, dyslipidemia, hypertension, obesity, post-menopausal and a sedentary lifestyle.      Review of Systems  Constitutional: Negative for malaise/fatigue.  Eyes: Negative for blurred vision.  Respiratory: Negative for shortness of breath.   All other systems reviewed and are negative.      Objective:   Physical Exam  Constitutional: She is oriented to person, place, and time. She appears well-developed and well-nourished. No distress.  Morbid obese   HENT:  Head: Normocephalic and atraumatic.  Right Ear: External ear normal.  Left Ear: External ear normal.  Nose: Nose normal.  Mouth/Throat: Oropharynx is clear and moist.  Eyes: Pupils are equal, round, and reactive to light.  Neck: Normal range of motion. Neck supple. No thyromegaly present.  Cardiovascular: Normal rate, regular rhythm, normal heart sounds and intact distal pulses.   No murmur heard. Pulmonary/Chest: Effort normal and breath sounds normal. No respiratory distress. She has no wheezes.  Abdominal: Soft. Bowel sounds are normal. She exhibits no distension. There is no tenderness.  Musculoskeletal: Normal range of motion. She exhibits no edema or tenderness.  Neurological: She is alert and oriented to person, place, and time.  Skin: Skin is warm and dry.  Psychiatric: She has a normal mood and affect. Her behavior is normal. Judgment and thought content normal.  Vitals reviewed.     BP (!) 131/57   Pulse (!) 59   Temp 97.4 F (36.3 C) (Oral)   Ht 5' 2"  (1.575 m)   Wt 211 lb (95.7 kg)   BMI 38.59 kg/m      Assessment & Plan:  1. Essential hypertension - CMP14+EGFR  2. Diabetes mellitus with peripheral vascular disease ([place]) - Bayer DCA Hb A1c Waived - CMP14+EGFR - Microalbumin / creatinine urine ratio  3. Chronic diastolic heart failure (HCC) - CMP14+EGFR  4. Coronary artery disease involving  native coronary artery of native heart without angina pectoris - CMP14+EGFR  5. PVD (peripheral vascular disease) (HCC) - CMP14+EGFR  6. PAD (peripheral artery disease) (HCC) - CMP14+EGFR  7. Mixed hyperlipidemia - CMP14+EGFR - Lipid panel  8. Hx of non-ST elevation myocardial infarction (NSTEMI) - CMP14+EGFR  9. Acute diastolic CHF (congestive heart failure) ([place]) - CMP14+EGFR   Continue all meds Labs pending Health Maintenance reviewed Diet and exercise encouraged RTO 3 months   Evelina Dun, FNP

## 2016-08-04 NOTE — Patient Instructions (Signed)
Diabetes Mellitus and Food It is important for you to manage your blood sugar (glucose) level. Your blood glucose level can be greatly affected by what you eat. Eating healthier foods in the appropriate amounts throughout the day at about the same time each day will help you control your blood glucose level. It can also help slow or prevent worsening of your diabetes mellitus. Healthy eating may even help you improve the level of your blood pressure and reach or maintain a healthy weight. General recommendations for healthful eating and cooking habits include:  Eating meals and snacks regularly. Avoid going long periods of time without eating to lose weight.  Eating a diet that consists mainly of plant-based foods, such as fruits, vegetables, nuts, legumes, and whole grains.  Using low-heat cooking methods, such as baking, instead of high-heat cooking methods, such as deep frying.  Work with your dietitian to make sure you understand how to use the Nutrition Facts information on food labels. How can food affect me? Carbohydrates Carbohydrates affect your blood glucose level more than any other type of food. Your dietitian will help you determine how many carbohydrates to eat at each meal and teach you how to count carbohydrates. Counting carbohydrates is important to keep your blood glucose at a healthy level, especially if you are using insulin or taking certain medicines for diabetes mellitus. Alcohol Alcohol can cause sudden decreases in blood glucose (hypoglycemia), especially if you use insulin or take certain medicines for diabetes mellitus. Hypoglycemia can be a life-threatening condition. Symptoms of hypoglycemia (sleepiness, dizziness, and disorientation) are similar to symptoms of having too much alcohol. If your health care provider has given you approval to drink alcohol, do so in moderation and use the following guidelines:  Women should not have more than one drink per day, and men  should not have more than two drinks per day. One drink is equal to: ? 12 oz of beer. ? 5 oz of wine. ? 1 oz of hard liquor.  Do not drink on an empty stomach.  Keep yourself hydrated. Have water, diet soda, or unsweetened iced tea.  Regular soda, juice, and other mixers might contain a lot of carbohydrates and should be counted.  What foods are not recommended? As you make food choices, it is important to remember that all foods are not the same. Some foods have fewer nutrients per serving than other foods, even though they might have the same number of calories or carbohydrates. It is difficult to get your body what it needs when you eat foods with fewer nutrients. Examples of foods that you should avoid that are high in calories and carbohydrates but low in nutrients include:  Trans fats (most processed foods list trans fats on the Nutrition Facts label).  Regular soda.  Juice.  Candy.  Sweets, such as cake, pie, doughnuts, and cookies.  Fried foods.  What foods can I eat? Eat nutrient-rich foods, which will nourish your body and keep you healthy. The food you should eat also will depend on several factors, including:  The calories you need.  The medicines you take.  Your weight.  Your blood glucose level.  Your blood pressure level.  Your cholesterol level.  You should eat a variety of foods, including:  Protein. ? Lean cuts of meat. ? Proteins low in saturated fats, such as fish, egg whites, and beans. Avoid processed meats.  Fruits and vegetables. ? Fruits and vegetables that may help control blood glucose levels, such as apples,   mangoes, and yams.  Dairy products. ? Choose fat-free or low-fat dairy products, such as milk, yogurt, and cheese.  Grains, bread, pasta, and rice. ? Choose whole grain products, such as multigrain bread, whole oats, and brown rice. These foods may help control blood pressure.  Fats. ? Foods containing healthful fats, such as  nuts, avocado, olive oil, canola oil, and fish.  Does everyone with diabetes mellitus have the same meal plan? Because every person with diabetes mellitus is different, there is not one meal plan that works for everyone. It is very important that you meet with a dietitian who will help you create a meal plan that is just right for you. This information is not intended to replace advice given to you by your health care provider. Make sure you discuss any questions you have with your health care provider. Document Released: 10/31/2004 Document Revised: 07/12/2015 Document Reviewed: 12/31/2012 Elsevier Interactive Patient Education  2017 Else[employer] Inc.  

## 2016-08-05 LAB — LIPID PANEL
CHOL/HDL RATIO: 2.4 ratio (ref 0.0–4.4)
Cholesterol, Total: 151 mg/dL (ref 100–199)
HDL: 62 mg/dL (ref >39)
LDL Calculated: 65 mg/dL (ref 0–99)
TRIGLYCERIDES: 120 mg/dL (ref 0–149)
VLDL Cholesterol Cal: 24 mg/dL (ref 5–40)

## 2016-08-05 LAB — CMP14+EGFR
A/G RATIO: 1.2 (ref 1.2–2.2)
ALBUMIN: 4.3 g/dL (ref 3.6–4.8)
ALT: 27 IU/L (ref 0–32)
AST: 17 IU/L (ref 0–40)
Alkaline Phosphatase: 68 IU/L (ref 39–117)
BUN/Creatinine Ratio: 16 (ref 12–28)
BUN: 13 mg/dL (ref 8–27)
Bilirubin Total: 0.2 mg/dL (ref 0.0–1.2)
CALCIUM: 9.8 mg/dL (ref 8.7–10.3)
CO2: 31 mmol/L — AB (ref 20–29)
CREATININE: 0.83 mg/dL (ref 0.57–1.00)
Chloride: 93 mmol/L — ABNORMAL LOW (ref 96–106)
GFR, EST AFRICAN AMERICAN: 86 mL/min/{1.73_m2} (ref >59)
GFR, EST NON AFRICAN AMERICAN: 74 mL/min/{1.73_m2} (ref >59)
GLOBULIN, TOTAL: 3.7 g/dL (ref 1.5–4.5)
Glucose: 163 mg/dL — ABNORMAL HIGH (ref 65–99)
POTASSIUM: 4.4 mmol/L (ref 3.5–5.2)
SODIUM: 137 mmol/L (ref 134–144)
TOTAL PROTEIN: 8 g/dL (ref 6.0–8.5)

## 2016-08-05 LAB — MICROALBUMIN / CREATININE URINE RATIO
CREATININE, UR: 146.4 mg/dL
MICROALB/CREAT RATIO: 260.3 mg/g{creat} — AB (ref 0.0–30.0)
Microalbumin, Urine: 381.1 ug/mL

## 2016-08-13 ENCOUNTER — Encounter: Payer: 59 | Admitting: *Deleted

## 2016-08-14 ENCOUNTER — Other Ambulatory Visit: Payer: Self-pay | Admitting: Cardiology

## 2016-08-14 NOTE — Telephone Encounter (Signed)
furosemide (LASIX) 40 MG tablet   Needing a new script stating that she was told to take 2 fluid pills    [facility] mail

## 2016-08-15 MED ORDER — FUROSEMIDE 40 MG PO TABS: 40.0000 mg | ORAL_TABLET | Freq: Every day | ORAL | 1 refills | Status: DC

## 2016-08-15 NOTE — Telephone Encounter (Signed)
Medication sent to pharmacy  

## 2016-09-01 ENCOUNTER — Other Ambulatory Visit: Payer: Self-pay | Admitting: Family

## 2016-09-01 DIAGNOSIS — I10 Essential (primary) hypertension: Secondary | ICD-10-CM

## 2016-09-02 ENCOUNTER — Other Ambulatory Visit: Payer: Self-pay | Admitting: Family

## 2016-09-02 MED ORDER — FUROSEMIDE 40 MG PO TABS: 40.0000 mg | ORAL_TABLET | Freq: Every day | ORAL | 0 refills | Status: DC

## 2016-09-02 NOTE — Telephone Encounter (Signed)
sent 

## 2016-09-05 ENCOUNTER — Other Ambulatory Visit: Payer: Self-pay | Admitting: Family

## 2016-09-05 DIAGNOSIS — I10 Essential (primary) hypertension: Secondary | ICD-10-CM

## 2016-09-08 ENCOUNTER — Other Ambulatory Visit: Payer: Self-pay | Admitting: Family

## 2016-09-08 DIAGNOSIS — I10 Essential (primary) hypertension: Secondary | ICD-10-CM

## 2016-09-19 ENCOUNTER — Telehealth: Payer: Self-pay

## 2016-09-19 MED ORDER — FUROSEMIDE 40 MG PO TABS: 40.0000 mg | ORAL_TABLET | Freq: Two times a day (BID) | ORAL | 3 refills | Status: DC

## 2016-09-19 NOTE — Telephone Encounter (Signed)
Patient stated she was told at last office visit that she was to take furosemide 40 mg twice daily. Chart states patient is only to take once daily. Patient states she is having the increased swelling and needs this medication twice daily. Patient states when she takes twice daily it keeps all swelling down.

## 2016-09-19 NOTE — Telephone Encounter (Signed)
Ok for lasix 40mg  bid  Zandra Abts MD

## 2016-09-19 NOTE — Telephone Encounter (Signed)
Patient notified and verbalized understanding. 

## 2016-09-22 ENCOUNTER — Other Ambulatory Visit: Payer: Self-pay | Admitting: Family

## 2016-09-22 DIAGNOSIS — I10 Essential (primary) hypertension: Secondary | ICD-10-CM

## 2016-09-29 ENCOUNTER — Other Ambulatory Visit: Payer: Self-pay | Admitting: *Deleted

## 2016-09-29 MED ORDER — METOPROLOL TARTRATE 50 MG PO TABS: 75.0000 mg | ORAL_TABLET | Freq: Two times a day (BID) | ORAL | 3 refills | Status: DC

## 2016-09-29 NOTE — Telephone Encounter (Signed)
Prescription sent to pharmacy. PT will need to take this BID since she will be cutting tablet in half and you can not cut XR.

## 2016-09-29 NOTE — Telephone Encounter (Signed)
Pt wants the 50 mg metoprolol refilled she splits this to make her 75 mg this works better than her taken the three 25 mg tablets her blood pressure goes up.  Refill this to [facility]

## 2016-09-29 NOTE — Addendum Note (Signed)
Addended by: Evelina Dun A on: 09/29/2016 12:44 PM   Modules accepted: Orders

## 2016-10-14 ENCOUNTER — Encounter: Payer: Self-pay | Admitting: Cardiovascular Disease

## 2016-10-14 ENCOUNTER — Ambulatory Visit (INDEPENDENT_AMBULATORY_CARE_PROVIDER_SITE_OTHER): Payer: Medicare Other | Admitting: Cardiovascular Disease

## 2016-10-14 VITALS — BP 124/50 | HR 68 | Ht 62.5 in | Wt 213.0 lb

## 2016-10-14 DIAGNOSIS — E782 Mixed hyperlipidemia: Secondary | ICD-10-CM

## 2016-10-14 DIAGNOSIS — I739 Peripheral vascular disease, unspecified: Secondary | ICD-10-CM

## 2016-10-14 DIAGNOSIS — I10 Essential (primary) hypertension: Secondary | ICD-10-CM

## 2016-10-14 DIAGNOSIS — I2510 Atherosclerotic heart disease of native coronary artery without angina pectoris: Secondary | ICD-10-CM | POA: Diagnosis not present

## 2016-10-14 NOTE — Patient Instructions (Signed)
Medication Instructions: Your physician recommends that you continue on your current medications as directed. Please refer to the Current Medication list given to you today.  Testing/Procedures: Your physician has requested that you have a aorta and iliac duplex. During this test, an ultrasound is used to evaluate blood flow to the aorta and iliac arteries. Allow one hour for this exam. Do not eat after midnight the day before and avoid carbonated beverages.   Your physician has requested that you have an ankle brachial index (ABI). During this test an ultrasound and blood pressure cuff are used to evaluate the arteries that supply the arms and legs with blood. Allow thirty minutes for this exam. There are no restrictions or special instructions.  Scheduled for 10/29/16 at 8 AM in the [place] office.  Follow-Up: Your physician wants you to follow-up in: 1 year with Dr. Fletcher [name]. You will receive a reminder letter in the mail two months in advance. If you don't receive a letter, please call our office to schedule the follow-up appointment.  If you need a refill on your cardiac medications before your next appointment, please call your pharmacy.

## 2016-10-14 NOTE — Progress Notes (Signed)
Cardiology Office Note   Date:  10/14/2016   ID:  Breiana, Stratmann Nov 11, 1950, MRN [id number]  PCP:  Sharion Balloon, FNP  Cardiologist: Dr. Harl Bowie  Chief Complaint  Patient presents with  . Follow-up    6 month follow up      History of Present Illness: Leah Ferrell is a 66 y.o. female who presents for a followup visit regarding peripheral arterial disease.  She has known history of coronary artery disease status post drug-eluting stent placement to the LAD X 2 Most recently in 2015 for in-stent restenosis.  She is known to have peripheral arterial disease status post left common iliac artery stent placement followed by left common femoral artery resection and bypass from left external iliac to the left profunda done by Dr. Kellie Simmering in 2015. She is known to have chronically occluded left SFA being managed medically.  She had worsening left leg claudication with elevated velocity at the proximal anastomosis of the graft.   Angiography in March 2018 showed mild to moderate calcified common iliac artery disease with patent stent in the left common iliac artery. There was severe stenosis in the left common femoral artery at the proximal anastomosis of the bypass to the profunda. I performed successful angioplasty and drug-coated balloon angioplasty without complications.  She has been doing well and denies any chest pain, shortness of breath or claudication.    Past Medical History:  Diagnosis Date  . CAD (coronary artery disease)    a. 04/2012 NSTEMI: s/p DES to LAD.  Marland Kitchen Chronic diastolic CHF (congestive heart failure) ([place])   . Diabetes mellitus without complication ([place])   . Dysrhythmia   . Environmental and seasonal allergies   . Hyperlipidemia   . Hypertension   . Leukocytosis   . Morbid obesity ([place])   . NSTEMI (non-ST elevated myocardial infarction) ([place]) 04/27/2012  . PONV (postoperative nausea and vomiting)   . PVD (peripheral vascular disease) ([place])    a.  04/2012 ABI: R 0.49, L 0.39. Angiography 06/14: Significant ostial left common iliac artery stenosis extending into the distal aorta, severe left common femoral artery stenosis, bilateral SFA occlusion with heavy calcifications. Functionally, one-vessel runoff bilaterally below the knee  . Shortness of breath     Past Surgical History:  Procedure Laterality Date  . ABDOMINAL AORTAGRAM N/A 07/21/2012   Procedure: ABDOMINAL Maxcine Ham;  Surgeon: Wellington Hampshire, MD;  Location: [place] CATH LAB;  Service: Cardiovascular;  Laterality: N/A;  . CARDIAC CATHETERIZATION     stents X 2  . CORONARY ANGIOGRAM  04/27/2012   Procedure: CORONARY ANGIOGRAM;  Surgeon: Thayer Headings, MD;  Location: [facility] CATH LAB;  Service: Cardiovascular;;  . ENDARTERECTOMY FEMORAL Left 11/02/2013   Procedure: RESECTION LEFT COMMON FEMORAL ARTERY AND INSERTION OF INTERPOSITION 9mm HEMASHIELD GRAFT FROM LEFT EXTERNAL  ILIAC ARTERY TO LEFT PROFUNDA  FEMORIS ARTERY;  Surgeon: Mal Misty, MD;  Location: [facility];  Service: Vascular;  Laterality: Left;  . EYE SURGERY Right 2017  . FRACTURE SURGERY Right Jul 07, 2014   Right Foot-  Pt.fell  at Home  by Heat duct  . ILIAC ARTERY STENT Left 08/31/2013  . LAD stent    . LEFT HEART CATHETERIZATION WITH CORONARY ANGIOGRAM N/A 04/23/2012   Procedure: LEFT HEART CATHETERIZATION WITH CORONARY ANGIOGRAM;  Surgeon: Peter M Martinique, MD;  Location[facility] C[facility] LAB;  Service: Cardiovascular;  Laterality: N/A;  . LOWER EXTREMITY ANGIOGRAM Left 08/31/2013   Procedure: LOWER EXTREMITY ANGIOGRAM;  Surgeon: Wellington Hampshire, MD;  Location: [facility] CATH LAB;  Service: Cardiovascular;  Laterality: Left;  . PERCUTANEOUS STENT INTERVENTION Left 08/31/2013   Procedure: PERCUTANEOUS STENT INTERVENTION;  Surgeon: Wellington Hampshire, MD;  Location: [place] CATH LAB;  Service: Cardiovascular;  Laterality: Left;  Common Iliac artery  . PERIPHERAL VASCULAR CATHETERIZATION N/A 03/19/2016   Procedure: Abdominal Aortogram w/Lower Extremity;   Surgeon: Wellington Hampshire, MD;  Location: [place] CV LAB;  Service: Cardiovascular;  Laterality: N/A;  . PERIPHERAL VASCULAR CATHETERIZATION  03/19/2016   Procedure: Peripheral Vascular Balloon Angioplasty-CFA Left;  Surgeon: Wellington Hampshire, MD;  Location: [place] CV LAB;  Service: Cardiovascular;;  . TUMOR REMOVAL     tumor removed from [place]     Current Outpatient Prescriptions  Medication Sig Dispense Refill  . albuterol (PROVENTIL HFA) 108 (90 Base) MCG/ACT inhaler INHALE ONE TO TWO PUFFS BY MOUTH EVERY 6 HOURS AS NEEDED FOR WHEEZING OR FOR SHORTNESS OF BREATH 18 g 3  . amLODipine (NORVASC) 10 MG tablet TAKE 1 TABLET BY MOUTH  DAILY 90 tablet 1  . Ascorbic Acid (VITAMIN C) 1000 MG tablet Take 1,000 mg by mouth 2 (two) times daily.    Marland Kitchen aspirin EC 81 MG tablet Take 81 mg by mouth daily.     . Calcium Carbonate-Vit D-Min (CALTRATE 600+D PLUS MINERALS) 600-800 MG-UNIT TABS Take 1 tablet by mouth 3 (three) times daily.     . clopidogrel (PLAVIX) 75 MG tablet Take 1 tablet (75 mg total) by mouth daily. 90 tablet 3  . fish oil-omega-3 fatty acids 1000 MG capsule Take 1 g by mouth every morning.    . furosemide (LASIX) 40 MG tablet Take 1 tablet (40 mg total) by mouth 2 (two) times daily. 60 tablet 3  . hydroxypropyl methylcellulose (ISOPTO TEARS) 2.5 % ophthalmic solution Place 1 drop into both eyes 3 (three) times daily as needed (for dry eyes).    . Insulin Pen Needle (RELION PEN NEEDLES) 29G X 12MM MISC Three times a day 1000 each 12  . lisinopril (PRINIVIL,ZESTRIL) 20 MG tablet Take 2 tablets (40 mg total) by mouth daily. (Patient taking differently: Take 20 mg by mouth 2 (two) times daily. ) 180 tablet 3  . metoprolol succinate (TOPROL-XL) 25 MG 24 hr tablet Take 1 tablet by mouth daily.    . metoprolol tartrate (LOPRESSOR) 50 MG tablet Take 1.5 tablets (75 mg total) by mouth 2 (two) times daily. 270 tablet 3  . montelukast (SINGULAIR) 10 MG tablet Take 1 tablet (10 mg total) by  mouth daily with breakfast. (Patient taking differently: Take 10 mg by mouth at bedtime. ) 90 tablet 2  . nitroGLYCERIN (NITROSTAT) 0.4 MG SL tablet Place 1 tablet (0.4 mg total) under the tongue every 5 (five) minutes x 3 doses as needed for chest pain (up to 3 doses). 25 tablet 4  . NOVOLOG MIX 70/30 FLEXPEN (70-30) 100 UNIT/ML FlexPen INJECT SUBCUTANEOUSLY 75  UNITS 3 TIMES DAILY WITH  MEALS 210 mL 2  . potassium chloride SA (K-DUR,KLOR-CON) 20 MEQ tablet TAKE 1 TABLET BY MOUTH  DAILY 90 tablet 1  . RELION PEN NEEDLES 29G X 12MM MISC USE 1 NEEDLE TO INJECT INSULIN SUBCUTANEOUSLY 3 TIMES DAILY 50 each 5  . rosuvastatin (CRESTOR) 20 MG tablet Take 1 tablet (20 mg total) by mouth daily. 90 tablet 3   No current facility-administered medications for this visit.     Allergies:   Tape    Social History:  The patient  reports that she quit smoking about 15 years ago. Her smoking use included Cigarettes. She started smoking about 52 years ago. She has a 36.00 pack-year smoking history. She has never used smokeless tobacco. She reports that she does not drink alcohol or use drugs.   Family History:  The patient's family history includes Alcohol abuse in her brother; Diabetes in her brother, daughter, maternal grandmother, mother, and son; Heart attack in her maternal grandfather; Heart disease in her brother; Hyperlipidemia in her mother and son; Hypertension in her daughter, mother, and son; Other in her sister and sister; Peripheral vascular disease in her mother.    ROS:  Please see the history of present illness.   Otherwise, review of systems are positive for none.   All other systems are reviewed and negative.    PHYSICAL EXAM: VS:  BP (!) 124/50   Pulse 68   Ht 5' 2.5" (1.588 m)   Wt 213 lb (96.6 kg)   BMI 38.34 kg/m  , BMI Body mass index is 38.34 kg/m. GEN: Well nourished, well developed, in no acute distress  HEENT: normal  Neck: no JVD, carotid bruits, or masses Cardiac: RRR; no   rubs, or gallops , trace edema . There is 2/6 systolic ejection murmur in the aortic area Respiratory:  clear to auscultation bilaterally, normal work of breathing GI: soft, nontender, nondistended, + BS MS: no deformity or atrophy  Skin: warm and dry, no rash Neuro:  Strength and sensation are intact Psych: euthymic mood, full affect Vascular: Femoral pulses are +1 bilaterally. Distal pulses are not palpable.No groin hematoma  EKG:  EKG is  ordered today.  EKG showed normal sinus rhythm with possible left atrial enlargement. No significant ST or T wave changes.  Recent Labs: 03/11/2016: Hemoglobin 13.7; Platelets 320 08/04/2016: ALT 27; BUN 13; Creatinine, Ser 0.83; Potassium 4.4; Sodium 137    Lipid Panel    Component Value Date/Time   CHOL 151 08/04/2016 1211   TRIG 120 08/04/2016 1211   TRIG 135 09/16/2012 1322   HDL 62 08/04/2016 1211   HDL 60 09/16/2012 1322   CHOLHDL 2.4 08/04/2016 1211   CHOLHDL 3.2 05/06/2012 1713   VLDL 32 05/06/2012 1713   LDLCALC 65 08/04/2016 1211   LDLCALC 92 09/16/2012 1322      Wt Readings from Last 3 Encounters:  10/14/16 213 lb (96.6 kg)  08/04/16 211 lb (95.7 kg)  06/11/16 215 lb (97.5 kg)       No flowsheet data found.    ASSESSMENT AND PLAN:  1.  Peripheral arterial disease:  She is doing well overall with no significant claudication. Continue medical therapy. She had D drug-coated balloon angioplasty on the left common femoral artery just above the anastomosis of the bypass to the profunda early this year. I requested repeat aortoiliac duplex an ABI.  2. Coronary artery disease involving native coronary arteries without angina: She is doing well overall. It continue medical therapy. Given her extensive vascular disease, I recommend continuing long-term dual antiplatelet therapy.  3. Essential hypertension: Blood pressure is well controlled on current medications.  4. Hyperlipidemia: Continue treatment with rosuvastatin with a  target LDL of less than 70.    Disposition:   FU with me in 12  month  Signed,  [name], MD  10/14/2016 11:47 AM    [place]

## 2016-10-25 ENCOUNTER — Other Ambulatory Visit: Payer: Self-pay | Admitting: Family

## 2016-10-25 DIAGNOSIS — I10 Essential (primary) hypertension: Secondary | ICD-10-CM

## 2016-11-06 ENCOUNTER — Encounter: Payer: Self-pay | Admitting: Family

## 2016-11-06 ENCOUNTER — Ambulatory Visit (INDEPENDENT_AMBULATORY_CARE_PROVIDER_SITE_OTHER): Payer: Medicare Other | Admitting: Family

## 2016-11-06 VITALS — BP 150/65 | HR 56 | Temp 97.8°F | Ht 62.5 in | Wt 213.6 lb

## 2016-11-06 DIAGNOSIS — I2510 Atherosclerotic heart disease of native coronary artery without angina pectoris: Secondary | ICD-10-CM | POA: Diagnosis not present

## 2016-11-06 DIAGNOSIS — Z23 Encounter for immunization: Secondary | ICD-10-CM

## 2016-11-06 DIAGNOSIS — I252 Old myocardial infarction: Secondary | ICD-10-CM

## 2016-11-06 DIAGNOSIS — L989 Disorder of the skin and subcutaneous tissue, unspecified: Secondary | ICD-10-CM | POA: Diagnosis not present

## 2016-11-06 DIAGNOSIS — E1169 Type 2 diabetes mellitus with other specified complication: Secondary | ICD-10-CM | POA: Diagnosis not present

## 2016-11-06 DIAGNOSIS — E785 Hyperlipidemia, unspecified: Secondary | ICD-10-CM

## 2016-11-06 DIAGNOSIS — E1151 Type 2 diabetes mellitus with diabetic peripheral angiopathy without gangrene: Secondary | ICD-10-CM

## 2016-11-06 DIAGNOSIS — Z1159 Encounter for screening for other viral diseases: Secondary | ICD-10-CM

## 2016-11-06 DIAGNOSIS — I10 Essential (primary) hypertension: Secondary | ICD-10-CM

## 2016-11-06 DIAGNOSIS — I739 Peripheral vascular disease, unspecified: Secondary | ICD-10-CM

## 2016-11-06 DIAGNOSIS — I5032 Chronic diastolic (congestive) heart failure: Secondary | ICD-10-CM | POA: Diagnosis not present

## 2016-11-06 LAB — BAYER DCA HB A1C WAIVED: HB A1C: 8.6 % — AB (ref <7.0)

## 2016-11-06 MED ORDER — METOPROLOL TARTRATE 100 MG PO TABS: 100.0000 mg | ORAL_TABLET | Freq: Two times a day (BID) | ORAL | 3 refills | Status: DC

## 2016-11-06 NOTE — Progress Notes (Signed)
Subjective:    Patient ID: Leah Ferrell, female    DOB: [birth year], 66 y.o.   MRN: [id number]  Pt presents to the office today for chronic follow up. Pt has CHF, CAD and hx MI and is followed by Cardiologists every 6 months. PT is followed by Vascular  for PVD and PAD. PT states these are stable.  PT requesting dermatologists referral for skin lesion on her face.  Hypertension  This is a chronic problem. The current episode started more than 1 year ago. The problem has been waxing and waning since onset. The problem is uncontrolled. Associated symptoms include peripheral edema (at times). Pertinent negatives include no blurred vision, chest pain or shortness of breath. Risk factors for coronary artery disease include diabetes mellitus, dyslipidemia, obesity, post-menopausal state and sedentary lifestyle. The current treatment provides moderate improvement. Hypertensive end-organ damage includes CAD/MI and heart failure. There is no history of kidney disease or CVA.  Diabetes  She presents for her follow-up diabetic visit. She has type 2 diabetes mellitus. Her disease course has been stable. There are no hypoglycemic associated symptoms. Associated symptoms include foot paresthesias. Pertinent negatives for diabetes include no blurred vision and no chest pain. There are no hypoglycemic complications. Symptoms are stable. Pertinent negatives for diabetic complications include no CVA. Risk factors for coronary artery disease include diabetes mellitus, dyslipidemia, obesity, hypertension, post-menopausal, sedentary lifestyle and family history. Her weight is stable. She is following a generally unhealthy diet. Her breakfast blood glucose range is generally 140-180 mg/dl. Eye exam is not current.  Congestive Heart Failure  Presents for follow-up visit. Associated symptoms include edema (at times). Pertinent negatives include no chest pain or shortness of breath. The symptoms have been stable.    Hyperlipidemia  This is a chronic problem. The current episode started more than 1 year ago. The problem is controlled. Recent lipid tests were reviewed and are normal. Exacerbating diseases include obesity. Pertinent negatives include no chest pain or shortness of breath. Current antihyperlipidemic treatment includes statins. The current treatment provides moderate improvement of lipids. Risk factors for coronary artery disease include diabetes mellitus, dyslipidemia, family history, obesity, hypertension, post-menopausal and a sedentary lifestyle.      Review of Systems  Eyes: Negative for blurred vision.  Respiratory: Negative for shortness of breath.   Cardiovascular: Positive for leg swelling. Negative for chest pain.  All other systems reviewed and are negative.      Objective:   Physical Exam  Constitutional: She is oriented to person, place, and time. She appears well-developed and well-nourished. No distress.  HENT:  Head: Normocephalic and atraumatic.  Right Ear: External ear normal.  Left Ear: External ear normal.  Nose: Nose normal.  Mouth/Throat: Oropharynx is clear and moist.  Eyes: Pupils are equal, round, and reactive to light.  Neck: Normal range of motion. Neck supple. No thyromegaly present.  Cardiovascular: Normal rate, regular rhythm and intact distal pulses.   Murmur heard. Pulmonary/Chest: Effort normal and breath sounds normal. No respiratory distress. She has no wheezes.  Abdominal: Soft. Bowel sounds are normal. She exhibits no distension. There is no tenderness.  Musculoskeletal: Normal range of motion. She exhibits edema (trace BLE). She exhibits no tenderness.  Neurological: She is alert and oriented to person, place, and time.  Skin: Skin is warm and dry.  Skin lesion right cheek  Psychiatric: She has a normal mood and affect. Her behavior is normal. Judgment and thought content normal.  Vitals reviewed.    BP (!) 150/61  Pulse (!) 57   Temp  97.8 F (36.6 C) (Oral)   Ht 5' 2.5" (1.588 m)   Wt 213 lb 9.6 oz (96.9 kg)   BMI 38.45 kg/m      Assessment & Plan:  1. Diabetes mellitus with peripheral vascular disease ([place]) - Ambulatory referral to Ophthalmology - Bayer DCA Hb A1c Waived - CMP14+EGFR  2. Essential hypertension Pt's metoprolol XL 75 mg and metoprolol 75 mg at bedtime was stopped today. PT was started on XL and was suppose to stop the metoprolol BID, but she only stopped AM. States her BP was elevated so restarted just evening dose. Long discussion we could not do this Will change to metoprolol 130m BID -Dash diet information given -Exercise encouraged - Stress Management  -Continue current meds - CMP14+EGFR - metoprolol tartrate (LOPRESSOR) 100 MG tablet; Take 1 tablet (100 mg total) by mouth 2 (two) times daily.  Dispense: 180 tablet; Refill: 3  3. Coronary artery disease involving native coronary artery of native heart without angina pectoris - CMP14+EGFR - Lipid panel  4. PVD (peripheral vascular disease) ([facility]) - CMP14+EGFR - Lipid panel  5. PAD (peripheral artery disease) ([facility]) - CMP14+EGFR - Lipid panel  6. Hyperlipidemia associated with type 2 diabetes mellitus ([facility]) - CMP14+EGFR - Lipid panel  7. Chronic diastolic heart failure ([facility]) - CMP14+EGFR  8. Hx of non-ST elevation myocardial infarction (NSTEMI) - CMP14+EGFR  9. Skin lesion of face - Ambulatory referral to Dermatology - CMP14+EGFR  10. Encounter for hepatitis C screening test for low risk patient - CMP14+EGFR - Hepatitis C antibody   Continue all meds Labs pending Health Maintenance reviewed Diet and exercise encouraged RTO 3 months   CEvelina Dun FNP

## 2016-11-06 NOTE — Patient Instructions (Addendum)

## 2016-11-07 ENCOUNTER — Other Ambulatory Visit: Payer: Self-pay | Admitting: Family

## 2016-11-07 LAB — LIPID PANEL
CHOLESTEROL TOTAL: 161 mg/dL (ref 100–199)
Chol/HDL Ratio: 2.6 ratio (ref 0.0–4.4)
HDL: 61 mg/dL (ref >39)
LDL Calculated: 77 mg/dL (ref 0–99)
Triglycerides: 115 mg/dL (ref 0–149)
VLDL Cholesterol Cal: 23 mg/dL (ref 5–40)

## 2016-11-07 LAB — CMP14+EGFR
ALBUMIN: 4.4 g/dL (ref 3.6–4.8)
ALK PHOS: 69 IU/L (ref 39–117)
ALT: 25 IU/L (ref 0–32)
AST: 22 IU/L (ref 0–40)
Albumin/Globulin Ratio: 1.2 (ref 1.2–2.2)
BILIRUBIN TOTAL: 0.3 mg/dL (ref 0.0–1.2)
BUN / CREAT RATIO: 17 (ref 12–28)
BUN: 13 mg/dL (ref 8–27)
CHLORIDE: 95 mmol/L — AB (ref 96–106)
CO2: 29 mmol/L (ref 20–29)
Calcium: 9.8 mg/dL (ref 8.7–10.3)
Creatinine, Ser: 0.77 mg/dL (ref 0.57–1.00)
GFR calc Af Amer: 94 mL/min/{1.73_m2} (ref >59)
GFR calc non Af Amer: 81 mL/min/{1.73_m2} (ref >59)
GLOBULIN, TOTAL: 3.6 g/dL (ref 1.5–4.5)
GLUCOSE: 178 mg/dL — AB (ref 65–99)
Potassium: 5 mmol/L (ref 3.5–5.2)
SODIUM: 139 mmol/L (ref 134–144)
Total Protein: 8 g/dL (ref 6.0–8.5)

## 2016-11-07 LAB — HEPATITIS C ANTIBODY: Hep C Virus Ab: <0.1 s/co ratio (ref 0.0–0.9)

## 2016-11-07 MED ORDER — DULAGLUTIDE 0.75 MG/0.5ML ~~LOC~~ SOAJ: 0.7500 mg | SUBCUTANEOUS | 3 refills | Status: DC

## 2016-11-07 NOTE — Addendum Note (Signed)
Addended by: Shelbie Ammons on: 11/07/2016 12:53 PM   Modules accepted: Orders

## 2016-11-12 ENCOUNTER — Ambulatory Visit: Payer: Medicare Other

## 2016-11-12 DIAGNOSIS — I739 Peripheral vascular disease, unspecified: Secondary | ICD-10-CM | POA: Diagnosis not present

## 2016-11-12 DIAGNOSIS — I700 Atherosclerosis of aorta: Secondary | ICD-10-CM | POA: Diagnosis not present

## 2016-11-15 ENCOUNTER — Other Ambulatory Visit: Payer: Self-pay | Admitting: Family

## 2016-11-15 DIAGNOSIS — E782 Mixed hyperlipidemia: Secondary | ICD-10-CM

## 2016-12-01 ENCOUNTER — Other Ambulatory Visit: Payer: Self-pay | Admitting: Family

## 2016-12-01 DIAGNOSIS — E1151 Type 2 diabetes mellitus with diabetic peripheral angiopathy without gangrene: Secondary | ICD-10-CM

## 2016-12-02 DIAGNOSIS — Z85828 Personal history of other malignant neoplasm of skin: Secondary | ICD-10-CM | POA: Diagnosis not present

## 2016-12-02 DIAGNOSIS — C44319 Basal cell carcinoma of skin of other parts of face: Secondary | ICD-10-CM | POA: Diagnosis not present

## 2016-12-02 DIAGNOSIS — D485 Neoplasm of uncertain behavior of skin: Secondary | ICD-10-CM | POA: Diagnosis not present

## 2016-12-02 DIAGNOSIS — L918 Other hypertrophic disorders of the skin: Secondary | ICD-10-CM | POA: Diagnosis not present

## 2016-12-10 ENCOUNTER — Other Ambulatory Visit: Payer: Self-pay | Admitting: *Deleted

## 2016-12-10 DIAGNOSIS — I739 Peripheral vascular disease, unspecified: Secondary | ICD-10-CM

## 2016-12-12 ENCOUNTER — Ambulatory Visit: Payer: Medicare Other | Admitting: Cardiology

## 2017-01-21 ENCOUNTER — Ambulatory Visit (INDEPENDENT_AMBULATORY_CARE_PROVIDER_SITE_OTHER): Payer: Medicare Other | Admitting: Cardiology

## 2017-01-21 ENCOUNTER — Other Ambulatory Visit: Payer: Self-pay

## 2017-01-21 ENCOUNTER — Encounter: Payer: Self-pay | Admitting: Cardiology

## 2017-01-21 VITALS — BP 140/74 | HR 63 | Ht 62.5 in | Wt 218.0 lb

## 2017-01-21 DIAGNOSIS — I10 Essential (primary) hypertension: Secondary | ICD-10-CM | POA: Diagnosis not present

## 2017-01-21 DIAGNOSIS — E782 Mixed hyperlipidemia: Secondary | ICD-10-CM | POA: Diagnosis not present

## 2017-01-21 DIAGNOSIS — C44319 Basal cell carcinoma of skin of other parts of face: Secondary | ICD-10-CM | POA: Diagnosis not present

## 2017-01-21 DIAGNOSIS — I2510 Atherosclerotic heart disease of native coronary artery without angina pectoris: Secondary | ICD-10-CM | POA: Diagnosis not present

## 2017-01-21 MED ORDER — FUROSEMIDE 40 MG PO TABS: ORAL_TABLET | ORAL | 1 refills | Status: DC

## 2017-01-21 NOTE — Progress Notes (Signed)
Clinical Summary Leah Ferrell is a 66 y.o.female seen today for follow up of the following medical problems.   1. CAD  - prior NSTEMI 04/2012 with DES to LAD  - 07/21/2013 cath at [facility] after abnormal nuclear stress test. Succesfull pci to LAD with DES.  - 03/2016 echo LVEF 65-70%, no WMAs.  - Jan 2018 nuclear stress: no ischemia.    - no recent chest pain. Nonspecific sharp epigastric pain, no exertional symptoms - compliant with meds  2. PAD  - followed by Dr Fletcher [name].    3. Chronic diastolic heart failure  - edema at times, overall controlled with lasix  4. HTN  - at goal at recent vascular visit, though mildly elevated today   5. Hyperlipidemia  - she is compliant with meds 10/2016 TC 161 TG 115 HDL 61 LDL 77   SH: moved recently from [place] to [place] in late May, a house with 3 acres.    Past Medical History:  Diagnosis Date  . CAD (coronary artery disease)    a. 04/2012 NSTEMI: s/p DES to LAD.  Marland Kitchen Chronic diastolic CHF (congestive heart failure) ([place])   . Diabetes mellitus without complication ([place])   . Dysrhythmia   . Environmental and seasonal allergies   . Hyperlipidemia   . Hypertension   . Leukocytosis   . Morbid obesity ([place])   . NSTEMI (non-ST elevated myocardial infarction) ([place]) 04/27/2012  . PONV (postoperative nausea and vomiting)   . PVD (peripheral vascular disease) ([place])    a. 04/2012 ABI: R 0.49, L 0.39. Angiography 06/14: Significant ostial left common iliac artery stenosis extending into the distal aorta, severe left common femoral artery stenosis, bilateral SFA occlusion with heavy calcifications. Functionally, one-vessel runoff bilaterally below the knee  . Shortness of breath      Allergies  Allergen Reactions  . Tape Rash     Current Outpatient Medications  Medication Sig Dispense Refill  . albuterol (PROVENTIL HFA) 108 (90 Base) MCG/ACT inhaler INHALE ONE TO TWO PUFFS BY MOUTH EVERY 6 HOURS AS NEEDED FOR  WHEEZING OR FOR SHORTNESS OF BREATH 18 g 3  . amLODipine (NORVASC) 10 MG tablet TAKE 1 TABLET BY MOUTH  DAILY 90 tablet 1  . Ascorbic Acid (VITAMIN C) 1000 MG tablet Take 1,000 mg by mouth 2 (two) times daily.    Marland Kitchen aspirin EC 81 MG tablet Take 81 mg by mouth daily.     . Calcium Carbonate-Vit D-Min (CALTRATE 600+D PLUS MINERALS) 600-800 MG-UNIT TABS Take 1 tablet by mouth 3 (three) times daily.     . clopidogrel (PLAVIX) 75 MG tablet Take 1 tablet (75 mg total) by mouth daily. 90 tablet 3  . Dulaglutide (TRULICITY) 1.61 WR/6.0AV SOPN Inject 0.75 mg into the skin once a week. 12 pen 3  . fish oil-omega-3 fatty acids 1000 MG capsule Take 1 g by mouth every morning.    . furosemide (LASIX) 40 MG tablet Take 1 tablet (40 mg total) by mouth 2 (two) times daily. 60 tablet 3  . hydroxypropyl methylcellulose (ISOPTO TEARS) 2.5 % ophthalmic solution Place 1 drop into both eyes 3 (three) times daily as needed (for dry eyes).    . Insulin Pen Needle (RELION PEN NEEDLES) 29G X 12MM MISC Three times a day 1000 each 12  . lisinopril (PRINIVIL,ZESTRIL) 20 MG tablet Take 2 tablets (40 mg total) by mouth daily. (Patient taking differently: Take 20 mg by mouth 2 (two) times daily. ) 180 tablet 3  .  metoprolol tartrate (LOPRESSOR) 100 MG tablet Take 1 tablet (100 mg total) by mouth 2 (two) times daily. 180 tablet 3  . montelukast (SINGULAIR) 10 MG tablet TAKE 1 TABLET BY MOUTH  DAILY WITH BREAKFAST 90 tablet 0  . nitroGLYCERIN (NITROSTAT) 0.4 MG SL tablet Place 1 tablet (0.4 mg total) under the tongue every 5 (five) minutes x 3 doses as needed for chest pain (up to 3 doses). 25 tablet 4  . NOVOLOG MIX 70/30 FLEXPEN (70-30) 100 UNIT/ML FlexPen INJECT SUBCUTANEOUSLY 75  UNITS 3 TIMES DAILY WITH  MEALS 210 mL 0  . potassium chloride SA (K-DUR,KLOR-CON) 20 MEQ tablet TAKE 1 TABLET BY MOUTH  DAILY 90 tablet 1  . RELION PEN NEEDLES 29G X 12MM MISC USE 1 NEEDLE TO INJECT INSULIN SUBCUTANEOUSLY 3 TIMES DAILY 50 each 5  .  rosuvastatin (CRESTOR) 20 MG tablet TAKE 1 TABLET BY MOUTH  DAILY 90 tablet 3   No current facility-administered medications for this visit.      Past Surgical History:  Procedure Laterality Date  . ABDOMINAL AORTAGRAM N/A 07/21/2012   Procedure: ABDOMINAL Maxcine Ham;  Surgeon: Wellington Hampshire, MD;  Location: [facility] CATH LAB;  Service: Cardiovascular;  Laterality: N/A;  . CARDIAC CATHETERIZATION     stents X 2  . CORONARY ANGIOGRAM  04/27/2012   Procedure: CORONARY ANGIOGRAM;  Surgeon: Thayer Headings, MD;  Location: [facility] CATH LAB;  Service: Cardiovascular;;  . ENDARTERECTOMY FEMORAL Left 11/02/2013   Procedure: RESECTION LEFT COMMON FEMORAL ARTERY AND INSERTION OF INTERPOSITION 88mm HEMASHIELD GRAFT FROM LEFT EXTERNAL  ILIAC ARTERY TO LEFT PROFUNDA  FEMORIS ARTERY;  Surgeon: Mal Misty, MD;  Location: [facility];  Service: Vascular;  Laterality: Left;  . EYE SURGERY Right 2017  . FRACTURE SURGERY Right Jul 07, 2014   Right Foot-  Pt.fell  at Home  by Heat duct  . ILIAC ARTERY STENT Left 08/31/2013  . LAD stent    . LEFT HEART CATHETERIZATION WITH CORONARY ANGIOGRAM N/A 04/23/2012   Procedure: LEFT HEART CATHETERIZATION WITH CORONARY ANGIOGRAM;  Surgeon: Peter M Martinique, MD;  Location: [facility] CATH LAB;  Service: Cardiovascular;  Laterality: N/A;  . LOWER EXTREMITY ANGIOGRAM Left 08/31/2013   Procedure: LOWER EXTREMITY ANGIOGRAM;  Surgeon: Wellington Hampshire, MD;  Location: [facility] CATH LAB;  Service: Cardiovascular;  Laterality: Left;  . PERCUTANEOUS STENT INTERVENTION Left 08/31/2013   Procedure: PERCUTANEOUS STENT INTERVENTION;  Surgeon: Wellington Hampshire, MD;  Location: [facility] CATH LAB;  Service: Cardiovascular;  Laterality: Left;  Common Iliac artery  . PERIPHERAL VASCULAR CATHETERIZATION N/A 03/19/2016   Procedure: Abdominal Aortogram w/Lower Extremity;  Surgeon: Wellington Hampshire, MD;  Location: [facility] CV LAB;  Service: Cardiovascular;  Laterality: N/A;  . PERIPHERAL VASCULAR CATHETERIZATION  03/19/2016    Procedure: Peripheral Vascular Balloon Angioplasty-CFA Left;  Surgeon: Wellington Hampshire, MD;  Location: [facility] CV LAB;  Service: Cardiovascular;;  . 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 Tape Rash      Family History  Problem Relation Age of Onset  . Diabetes Mother   . Hyperlipidemia Mother   . Hypertension Mother   . Peripheral vascular disease Mother   . Diabetes Maternal Grandmother   . Heart attack Maternal Grandfather   . Heart disease Brother   . Other Sister        drowned  . Diabetes Brother   . Alcohol abuse Brother   . Other  Sister        drowned  . Diabetes Daughter        borderline  . Hypertension Daughter   . Diabetes Son   . Hypertension Son   . Hyperlipidemia Son      Social History Leah Ferrell reports that she quit smoking about 15 years ago. Her smoking use included cigarettes. She started smoking about 52 years ago. She has a 36.00 pack-year smoking history. she has never used smokeless tobacco. Leah Ferrell reports that she does not drink alcohol.   Review of Systems CONSTITUTIONAL: No weight loss, fever, chills, weakness or fatigue.  HEENT: Eyes: No visual loss, blurred vision, double vision or yellow sclerae.No hearing loss, sneezing, congestion, runny nose or sore throat.  SKIN: No rash or itching.  CARDIOVASCULAR: per hpi RESPIRATORY: No shortness of breath, cough or sputum.  GASTROINTESTINAL: No anorexia, nausea, vomiting or diarrhea. No abdominal pain or blood.  GENITOURINARY: No burning on urination, no polyuria NEUROLOGICAL: No headache, dizziness, syncope, paralysis, ataxia, numbness or tingling in the extremities. No change in bowel or bladder control.  MUSCULOSKELETAL: No muscle, back pain, joint pain or stiffness.  LYMPHATICS: No enlarged nodes. No history of splenectomy.  PSYCHIATRIC: No history of depression or anxiety.  ENDOCRINOLOGIC: No reports of sweating, cold or heat  intolerance. No polyuria or polydipsia.  Marland Kitchen   Physical Examination Vitals:   01/21/17 1455  BP: 140/74  Pulse: 63  SpO2: 98%   Vitals:   01/21/17 1455  Weight: 218 lb (98.9 kg)  Height: 5' 2.5" (1.588 m)    Gen: resting comfortably, no acute distress HEENT: no scleral icterus, pupils equal round and reactive, no palptable cervical adenopathy,  CV: RRR, no m/r/g, no jvd Resp: Clear to auscultation bilaterally GI: abdomen is soft, non-tender, non-distended, normal bowel sounds, no hepatosplenomegaly MSK: extremities are warm, no edema.  Skin: warm, no rash Neuro:  no focal deficits Psych: appropriate affect   Diagnostic Studies 04/2012 Echo: LVEF 55-60%, no WMA, mild LVH, grade I diastolic dysfunction  07/5033 ABI: Right 0.57 left 0.48. Moderate left iliac disease, distal right SFA occlusion with reconstitution at the popliteal.  04/2012 Carotid US: bilateral plaque without significant stenosis.  04/2012 Cardiac Cath PROCEDURAL FINDINGS  Hemodynamics:  AO 155/58 mean 91 mm Hg  LV 163/13 mm Hg  Coronary angiography:  Coronary dominance: right  Left mainstem: Short with shared coronary ostia for the LAD and LCX.  Left anterior descending (LAD): diffuse 30-40% proximal. 90% focal lesion after the first diagonal.  Left circumflex (LCx): <20% proximal and mid. 80% very small distal OM.  Right coronary artery (RCA): Diffuse 20% proximal and mid vessel disease. Moderate calcification.  Left ventriculography: Left ventricular systolic function is normal, LVEF is estimated at 55-65%, there is no significant mitral regurgitation  PCI Note: Following the diagnostic procedure, the decision was made to proceed with PCI. The radial sheath was upsized to a 6 [place]. Bilinta 180 mg was given orally. Weight-based bivalirudin was given for anticoagulation. Once a therapeutic ACT was achieved, a 6 [place] XB 3.0 guide catheter was inserted. A prowater coronary guidewire was used to  cross the lesion. The lesion was predilated with a 2.5 mm balloon. The lesion was then stented with a 3.0x 16 mm Promus premier stent. The stent was postdilated with a 3.25 mm noncompliant balloon. Following PCI, there was 0% residual stenosis and TIMI-3 flow. Final angiography confirmed an excellent result. The patient tolerated the procedure well. There were no immediate procedural  complications. A TR band was used for radial hemostasis. The patient was transferred to the post catheterization recovery area for further monitoring.  PCI Data:  Vessel - LAD/Segment - mid  Percent Stenosis (pre) 90%  TIMI-flow 3  Stent 3.0 x 16 mm Promus premier  Percent Stenosis (post) 0%  TIMI-flow (post) 3  Final Conclusions:  1. Single vessel obstructive CAD  2. Normal LV function.  3. Successful stenting of the mid LAD with a DES.   07/2012 Lower Extremity Angiography Hemodynamics: Central Aortic Pressure / Mean Aortic Pressure: 157/70  Findings:   Abdominal aorta: Normal in size with no aneurysm. There is mild atherosclerosis below the renal arteries. Distally above the iliac bifurcation there is a calcified plaque at the origin of the left external iliac causing 30% stenosis.   Left renal artery: Normal   Right renal artery: Normal   Celiac artery: Patent   Superior mesenteric artery: Patent   Right common iliac artery: Diffuse 20% disease.   Right internal iliac artery: Normal   Right external iliac artery: Normal   Right common femoral artery: Minor irregularities.   Right profunda femoral artery: Diffuse 20% disease proximally.   Right superficial femoral artery: Diffuse atherosclerosis throughout its course. There is 20% disease at the ostium. This is followed by 70% stenosis proximally. There is another 70% stenosis in the midsegment followed by an occlusion distally Reconstituting in the proximal popliteal artery. This segment is about 50 mm and heavily  calcified.   Right popliteal artery: 20-30% disease proximally.   Right tibial peroneal trunk: Normal   Right anterior tibial artery: Diffusely diseased proximally and seems to be occluded in the midsegment.   Right peroneal artery: Patent but diffusely diseased.   Right posterior tibial artery: Patent and the dominant vessel below the knee. It has minor irregularities.   Left common iliac artery: There is a 70-80 % heavily calcified lesion proximally.   Left internal iliac artery: Diffusely diseased.   Left external iliac artery: Minor irregularities.   Left common femoral artery: Heavily calcified with diffuse 90% disease extending into the ostium of the SFA.   Left profunda femoral artery: Normal.   Left superficial femoral artery: 70% ostial disease with heavy calcifications. Diffuse 40% disease proximally. The vessel is occluded in the midsegment with reconstitution distally via collaterals from the profunda.   Left popliteal artery: Minor irregularities.   Left tibial peroneal trunk: Minor irregularities.   Left anterior tibial artery: Patent. This is the dominant vessel.   Left peroneal artery: Patent but diffusely diseased.   Left posterior tibial artery: Occlude proximally. Conclusions: 1. Significant heavily calcified ostial left common iliac artery stenosis with plaque extending into the distal aorta.  2. Severe heavily calcified disease in the left common femoral artery.  3. Bilateral SFA occlusion with heavy calcifications.  4. Significant below the knee disease bilaterally as outlined above.  Recommendations:  The patient has diffuse and heavily calcified peripheral arterial disease. Revascularization options are somewhat difficult by endovascular means. There is heavily calcified disease in the left common femoral artery which is not approachable by endovascular options. Endarterectomy is the best option for this. The stenosis in the left  common iliac artery can be treated with stenting but will require likely stenting the distal aorta as well. Both SFAs are diffusely diseased, calcified with chronic occlusion. We'll attempt medical therapy and a walking program for now.   11/2012 Labs: 140 K 4.2 Cl 97 Cr 0.75 BUN 9 AST 16 ALT  21 T.bili 0.2 TC 205 TG 169 HDL 70 LDL 101  01/2013 Event Monitor No symptoms, no arrythmias  06/2013 Lexiscan MPI Large reversible defect in the basal anterior, basal anteroseptal, mid anterior, mid anteroseptal, apical anteior, apical segments mild to moderate.  07/21/13 Cath Hemodynamic Measurements Pressures:  - AO 130 / 56. - LV systolic 621 LV End-diastolic 13.  Angiographic Findings: Left main: Normal Left anterior descending: There is 70% stenosis at the proximal segment, first diagonal is intermediate in size, there is a stent in mid LAD which is patent. Left circumflex: Angiographically normal Right coronary artery: There is 30% narrowing in the mid segment. Right dominant  Left ventriculogram: Not done.   INTERVENTIONAL PROCEDURE NARRATIVE:  Intra-operative Anticoagulation: Additional dose of 2500 heparin IV  Equipment - Guide Catheters: 6 [place] EBU 3 - Guide Wires: Run-through  Procedure Details: I advanced a 3.0 x 12 balloon and predilated the lesion up to 14 atmospheres for 20 seconds and then I repeated that for another 14 atmospheres for 20 seconds. Then I advanced a Promus 3.5 x 16 drug-eluting stent and deployed it at the ostium and  proximal LAD with extreme care not to involve the ostium of left circumflex. I deployed the stent at 16 atmospheres, for 45 seconds, enlarging the stent to 3.7 mm. Post deployment angiography showed excellent results no complications. I gave the patient  Effient 60 mg, removed the catheter and used a TR band to obtain hemostasis.Marland Kitchen  Post-operative Anticoagulation: Aspirin and Effient  Complications: None  IMPRESSION: 1. Successful  PCI to proximal LAD with a Promus 3.5 x 16 drug-eluting stent, postdilated to 3.7 mm. 2. Patent mid LAD stent. 3. Normal left ventricular end-diastolic pressure of 13 mm Hg. 4. Coronary angiography and percutaneous coronary intervention performed with access through the right radial artery.  PLAN: 1. Aspirin 81 mg and Effient 10 mg daily for at least one year 2. Continue medical management of coronary artery disease. 3. Due to the fact that patient has significant life style limiting Rutherford class III claudication, I will proceed with PVR and duplex study. 4. Followup with Dr. Hamilton Capri.    Jan 2018 Nuclear stress  Nuclear stress EF: 59%.  The study is normal.  This is a low risk study.  There was no ST segment deviation noted during stress.  Low risk stress nuclear study with normal perfusion and normal left ventricular regional and global systolic function.    03/2016 echo Study Conclusions  - Left ventricle: The cavity size was normal. Wall thickness was increased in a pattern of severe LVH. Systolic function was vigorous. The estimated ejection fraction was in the range of 65% to 70%. Wall motion was normal; there were no regional wall motion abnormalities. Doppler parameters are consistent with abnormal left ventricular relaxation (grade 1 diastolic dysfunction). Doppler parameters are consistent with high ventricular filling pressure. - Mitral valve: Calcified annulus. Mildly thickened leaflets . - Left atrium: The atrium was moderately dilated.  Impressions:  - Vigorous LV systolic function; severe LVH; grade 1 diastolic dysfunction with elevated LV filling pressure; sclerotic aortic valve; moderate LAE.    Assessment and Plan  1. CAD - no recent symptoms, she will continue current meds  2. PAD - continue to follow with Dr Fletcher [name] and vascular  3. Chronic diastolic HF - overall controlled, continue diuretics.   4.  HTN -midly elevated in clinic, at goal at recent vascular visit - continue to monitor at this time  5. Hyperlipidemia - she will  continue statin, lipids are at goal      Arnoldo Lenis, M.D.

## 2017-01-21 NOTE — Patient Instructions (Signed)
Your physician wants you to follow-up in: [place] will receive a reminder letter in the mail two months in advance. If you don't receive a letter, please call our office to schedule the follow-up appointment.  Your physician has recommended you make the following change in your medication:  TAKE LASIX 40 MG DAILY - MAY TAKE ADDITIONAL TABLET AS NEEDED FOR SWELLING - NEW RX SENT TO PHARMACY   Thank you for choosing [facility]!!

## 2017-01-22 DIAGNOSIS — C44319 Basal cell carcinoma of skin of other parts of face: Secondary | ICD-10-CM | POA: Diagnosis not present

## 2017-01-25 ENCOUNTER — Encounter: Payer: Self-pay | Admitting: Cardiology

## 2017-01-29 ENCOUNTER — Other Ambulatory Visit: Payer: Self-pay | Admitting: Family

## 2017-01-29 DIAGNOSIS — I10 Essential (primary) hypertension: Secondary | ICD-10-CM

## 2017-01-30 ENCOUNTER — Other Ambulatory Visit: Payer: Self-pay | Admitting: Family

## 2017-02-04 DIAGNOSIS — L03211 Cellulitis of face: Secondary | ICD-10-CM | POA: Diagnosis not present

## 2017-02-08 ENCOUNTER — Other Ambulatory Visit: Payer: Self-pay | Admitting: Family

## 2017-02-08 DIAGNOSIS — I10 Essential (primary) hypertension: Secondary | ICD-10-CM

## 2017-02-08 DIAGNOSIS — E1151 Type 2 diabetes mellitus with diabetic peripheral angiopathy without gangrene: Secondary | ICD-10-CM

## 2017-02-08 DIAGNOSIS — I2510 Atherosclerotic heart disease of native coronary artery without angina pectoris: Secondary | ICD-10-CM

## 2017-02-08 DIAGNOSIS — I739 Peripheral vascular disease, unspecified: Secondary | ICD-10-CM

## 2017-02-08 DIAGNOSIS — I5031 Acute diastolic (congestive) heart failure: Secondary | ICD-10-CM

## 2017-02-18 DIAGNOSIS — Z5189 Encounter for other specified aftercare: Secondary | ICD-10-CM | POA: Diagnosis not present

## 2017-02-25 ENCOUNTER — Ambulatory Visit: Payer: Medicare Other | Admitting: Family

## 2017-03-02 ENCOUNTER — Ambulatory Visit (INDEPENDENT_AMBULATORY_CARE_PROVIDER_SITE_OTHER): Payer: Medicare Other | Admitting: Family

## 2017-03-02 ENCOUNTER — Encounter: Payer: Self-pay | Admitting: Family

## 2017-03-02 VITALS — BP 136/49 | HR 53 | Temp 97.3°F | Ht 62.5 in | Wt 216.0 lb

## 2017-03-02 DIAGNOSIS — I2510 Atherosclerotic heart disease of native coronary artery without angina pectoris: Secondary | ICD-10-CM

## 2017-03-02 DIAGNOSIS — E785 Hyperlipidemia, unspecified: Secondary | ICD-10-CM

## 2017-03-02 DIAGNOSIS — I10 Essential (primary) hypertension: Secondary | ICD-10-CM

## 2017-03-02 DIAGNOSIS — I252 Old myocardial infarction: Secondary | ICD-10-CM

## 2017-03-02 DIAGNOSIS — E1159 Type 2 diabetes mellitus with other circulatory complications: Secondary | ICD-10-CM | POA: Diagnosis not present

## 2017-03-02 DIAGNOSIS — E1169 Type 2 diabetes mellitus with other specified complication: Secondary | ICD-10-CM | POA: Diagnosis not present

## 2017-03-02 DIAGNOSIS — I739 Peripheral vascular disease, unspecified: Secondary | ICD-10-CM

## 2017-03-02 DIAGNOSIS — E1151 Type 2 diabetes mellitus with diabetic peripheral angiopathy without gangrene: Secondary | ICD-10-CM | POA: Diagnosis not present

## 2017-03-02 DIAGNOSIS — I5032 Chronic diastolic (congestive) heart failure: Secondary | ICD-10-CM | POA: Diagnosis not present

## 2017-03-02 DIAGNOSIS — I152 Hypertension secondary to endocrine disorders: Secondary | ICD-10-CM

## 2017-03-02 LAB — BAYER DCA HB A1C WAIVED: HB A1C (BAYER DCA - WAIVED): 8 % — ABNORMAL HIGH (ref <7.0)

## 2017-03-02 MED ORDER — ALBUTEROL SULFATE HFA 108 (90 BASE) MCG/ACT IN AERS: INHALATION_SPRAY | RESPIRATORY_TRACT | 2 refills | Status: DC

## 2017-03-02 MED ORDER — INSULIN ASPART PROT & ASPART (70-30 MIX) 100 UNIT/ML PEN: PEN_INJECTOR | SUBCUTANEOUS | 0 refills | Status: DC

## 2017-03-02 MED ORDER — INSULIN DEGLUDEC 200 UNIT/ML ~~LOC~~ SOPN: 10.0000 [IU] | PEN_INJECTOR | Freq: Every day | SUBCUTANEOUS | 1 refills | Status: DC

## 2017-03-02 NOTE — Progress Notes (Signed)
Subjective:    Patient ID: Leah Ferrell, female    DOB: May 25, 1950, 67 y.o.   MRN: [id number]  Pt presents to the office today for chronic follow up. Pt has CHF, CAD and hx MI and is followed by Cardiologists every 6 months. PT is followed by Vascular for PVD and PAD. PT states these are stable. Diabetes  She presents for her follow-up diabetic visit. She has type 2 diabetes mellitus. Her disease course has been stable. There are no hypoglycemic associated symptoms. Pertinent negatives for diabetes include no blurred vision, no foot paresthesias and no visual change. There are no hypoglycemic complications. Symptoms are stable. Diabetic complications include heart disease and nephropathy. Risk factors for coronary artery disease include diabetes mellitus, dyslipidemia, family history, obesity, hypertension and sedentary lifestyle. Her weight is stable. She is following a generally unhealthy diet. Her breakfast blood glucose range is generally 130-140 mg/dl. Eye exam is not current.  Hypertension  This is a chronic problem. The current episode started more than 1 year ago. The problem has been resolved since onset. The problem is controlled. Associated symptoms include malaise/fatigue and peripheral edema. Pertinent negatives include no blurred vision or shortness of breath. The current treatment provides moderate improvement. Hypertensive end-organ damage includes kidney disease, CAD/MI and heart failure.  Hyperlipidemia  This is a chronic problem. The current episode started more than 1 year ago. The problem is controlled. Recent lipid tests were reviewed and are normal. Exacerbating diseases include obesity. Pertinent negatives include no shortness of breath. Current antihyperlipidemic treatment includes statins. The current treatment provides moderate improvement of lipids. Risk factors for coronary artery disease include diabetes mellitus, dyslipidemia, family history, obesity, hypertension, a  sedentary lifestyle and post-menopausal.      Review of Systems  Constitutional: Positive for malaise/fatigue.  Eyes: Negative for blurred vision.  Respiratory: Negative for shortness of breath.   All other systems reviewed and are negative.      Objective:   Physical Exam  Constitutional: She is oriented to person, place, and time. She appears well-developed and well-nourished. No distress.  HENT:  Head: Normocephalic and atraumatic.  Right Ear: External ear normal.  Left Ear: External ear normal.  Nose: Nose normal.  Mouth/Throat: Oropharynx is clear and moist.  Eyes: Pupils are equal, round, and reactive to light.  Neck: Normal range of motion. Neck supple. No thyromegaly present.  Cardiovascular: Normal rate, regular rhythm, normal heart sounds and intact distal pulses.  No murmur heard. Pulmonary/Chest: Effort normal and breath sounds normal. No respiratory distress. She has no wheezes.  Abdominal: Soft. Bowel sounds are normal. She exhibits no distension. There is no tenderness.  Musculoskeletal: Normal range of motion. She exhibits edema (trace in BLE). She exhibits no tenderness.  Neurological: She is alert and oriented to person, place, and time.  Skin: Skin is warm and dry.  Psychiatric: She has a normal mood and affect. Her behavior is normal. Judgment and thought content normal.  Vitals reviewed.   BP (!) 136/49   Pulse (!) 53   Temp (!) 97.3 F (36.3 C) (Oral)   Ht 5' 2.5" (1.588 m)   Wt 216 lb (98 kg)   BMI 38.88 kg/m      Assessment & Plan:  1. Hypertension associated with diabetes ([place]) - CMP14+EGFR  2. Diabetes mellitus with peripheral vascular disease ([place]) Will add Tresiba 10 units today, discussed importance of monitoring BS and decreasing Novolog as needed. Pt checks her blood glucose before giving her Novolog.  Low carb diet - Bayer DCA Hb A1c Waived - CMP14+EGFR - Ambulatory referral to Ophthalmology - Insulin Degludec (TRESIBA FLEXTOUCH)  200 UNIT/ML SOPN; Inject 10 Units into the skin daily.  Dispense: 12 pen; Refill: 1 - insulin aspart protamine - aspart (NOVOLOG MIX 70/30 FLEXPEN) (70-30) 100 UNIT/ML FlexPen; INJECT SUBCUTANEOUSLY 75  UNITS 3 TIMES DAILY WITH  MEALS  Dispense: 210 mL; Refill: 0  3. Coronary artery disease involving native coronary artery of native heart without angina pectoris - CMP14+EGFR - Ambulatory referral to Ophthalmology  4. PVD (peripheral vascular disease) ([facility]) - CMP14+EGFR  5. Chronic diastolic heart failure ([facility]) - CMP14+EGFR  6. PAD (peripheral artery disease) ([facility]) - CMP14+EGFR  7. Hyperlipidemia associated with type 2 diabetes mellitus ([facility]) - CMP14+EGFR - Lipid panel  8. Hx of non-ST elevation myocardial infarction (NSTEMI) - CMP14+EGFR   Continue all meds Labs pending Health Maintenance reviewed Diet and exercise encouraged RTO 3 months   Evelina Dun, FNP

## 2017-03-02 NOTE — Patient Instructions (Signed)
Diabetes Mellitus and Nutrition When you have diabetes (diabetes mellitus), it is very important to have healthy eating habits because your blood sugar (glucose) levels are greatly affected by what you eat and drink. Eating healthy foods in the appropriate amounts, at about the same times every day, can help you:  Control your blood glucose.  Lower your risk of heart disease.  Improve your blood pressure.  Reach or maintain a healthy weight.  Every person with diabetes is different, and each person has different needs for a meal plan. Your health care provider may recommend that you work with a diet and nutrition specialist (dietitian) to make a meal plan that is best for you. Your meal plan may vary depending on factors such as:  The calories you need.  The medicines you take.  Your weight.  Your blood glucose, blood pressure, and cholesterol levels.  Your activity level.  Other health conditions you have, such as heart or kidney disease.  How do carbohydrates affect me? Carbohydrates affect your blood glucose level more than any other type of food. Eating carbohydrates naturally increases the amount of glucose in your blood. Carbohydrate counting is a method for keeping track of how many carbohydrates you eat. Counting carbohydrates is important to keep your blood glucose at a healthy level, especially if you use insulin or take certain oral diabetes medicines. It is important to know how many carbohydrates you can safely have in each meal. This is different for every person. Your dietitian can help you calculate how many carbohydrates you should have at each meal and for snack. Foods that contain carbohydrates include:  Bread, cereal, rice, pasta, and crackers.  Potatoes and corn.  Peas, beans, and lentils.  Milk and yogurt.  Fruit and juice.  Desserts, such as cakes, cookies, ice cream, and candy.  How does alcohol affect me? Alcohol can cause a sudden decrease in blood  glucose (hypoglycemia), especially if you use insulin or take certain oral diabetes medicines. Hypoglycemia can be a life-threatening condition. Symptoms of hypoglycemia (sleepiness, dizziness, and confusion) are similar to symptoms of having too much alcohol. If your health care provider says that alcohol is safe for you, follow these guidelines:  Limit alcohol intake to no more than 1 drink per day for nonpregnant women and 2 drinks per day for men. One drink equals 12 oz of beer, 5 oz of wine, or 1 oz of hard liquor.  Do not drink on an empty stomach.  Keep yourself hydrated with water, diet soda, or unsweetened iced tea.  Keep in mind that regular soda, juice, and other mixers may contain a lot of sugar and must be counted as carbohydrates.  What are tips for following this plan? Reading food labels  Start by checking the serving size on the label. The amount of calories, carbohydrates, fats, and other nutrients listed on the label are based on one serving of the food. Many foods contain more than one serving per package.  Check the total grams (g) of carbohydrates in one serving. You can calculate the number of servings of carbohydrates in one serving by dividing the total carbohydrates by 15. For example, if a food has 30 g of total carbohydrates, it would be equal to 2 servings of carbohydrates.  Check the number of grams (g) of saturated and trans fats in one serving. Choose foods that have low or no amount of these fats.  Check the number of milligrams (mg) of sodium in one serving. Most people   should limit total sodium intake to less than 2,300 mg per day.  Always check the nutrition information of foods labeled as "low-fat" or "nonfat". These foods may be higher in added sugar or refined carbohydrates and should be avoided.  Talk to your dietitian to identify your daily goals for nutrients listed on the label. Shopping  Avoid buying canned, premade, or processed foods. These  foods tend to be high in fat, sodium, and added sugar.  Shop around the outside edge of the grocery store. This includes fresh fruits and vegetables, bulk grains, fresh meats, and fresh dairy. Cooking  Use low-heat cooking methods, such as baking, instead of high-heat cooking methods like deep frying.  Cook using healthy oils, such as olive, canola, or sunflower oil.  Avoid cooking with butter, cream, or high-fat meats. Meal planning  Eat meals and snacks regularly, preferably at the same times every day. Avoid going long periods of time without eating.  Eat foods high in fiber, such as fresh fruits, vegetables, beans, and whole grains. Talk to your dietitian about how many servings of carbohydrates you can eat at each meal.  Eat 4-6 ounces of lean protein each day, such as lean meat, chicken, fish, eggs, or tofu. 1 ounce is equal to 1 ounce of meat, chicken, or fish, 1 egg, or 1/4 cup of tofu.  Eat some foods each day that contain healthy fats, such as avocado, nuts, seeds, and fish. Lifestyle   Check your blood glucose regularly.  Exercise at least 30 minutes 5 or more days each week, or as told by your health care provider.  Take medicines as told by your health care provider.  Do not use any products that contain nicotine or tobacco, such as cigarettes and e-cigarettes. If you need help quitting, ask your health care provider.  Work with a counselor or diabetes educator to identify strategies to manage stress and any emotional and social challenges. What are some questions to ask my health care provider?  Do I need to meet with a diabetes educator?  Do I need to meet with a dietitian?  What number can I call if I have questions?  When are the best times to check my blood glucose? Where to find more information:  American Diabetes Association: diabetes.org/food-and-fitness/food  Academy of Nutrition and Dietetics:  www.eatright.org/resources/health/diseases-and-conditions/diabetes  National Institute of Diabetes and Digestive and Kidney Diseases (NIH): www.niddk.nih.gov/health-information/diabetes/overview/diet-eating-physical-activity Summary  A healthy meal plan will help you control your blood glucose and maintain a healthy lifestyle.  Working with a diet and nutrition specialist (dietitian) can help you make a meal plan that is best for you.  Keep in mind that carbohydrates and alcohol have immediate effects on your blood glucose levels. It is important to count carbohydrates and to use alcohol carefully. This information is not intended to replace advice given to you by your health care provider. Make sure you discuss any questions you have with your health care provider. Document Released: 10/31/2004 Document Revised: 03/10/2016 Document Reviewed: 03/10/2016 Elsevier Interactive Patient Education  2018 Elsevier Inc.  

## 2017-03-03 ENCOUNTER — Telehealth: Payer: Self-pay | Admitting: Family

## 2017-03-03 LAB — LIPID PANEL
CHOLESTEROL TOTAL: 176 mg/dL (ref 100–199)
Chol/HDL Ratio: 2.5 ratio (ref 0.0–4.4)
HDL: 70 mg/dL (ref >39)
LDL CALC: 86 mg/dL (ref 0–99)
Triglycerides: 101 mg/dL (ref 0–149)
VLDL CHOLESTEROL CAL: 20 mg/dL (ref 5–40)

## 2017-03-03 LAB — CMP14+EGFR
ALBUMIN: 4.2 g/dL (ref 3.6–4.8)
ALK PHOS: 63 IU/L (ref 39–117)
ALT: 27 IU/L (ref 0–32)
AST: 20 IU/L (ref 0–40)
Albumin/Globulin Ratio: 1.2 (ref 1.2–2.2)
BILIRUBIN TOTAL: 0.2 mg/dL (ref 0.0–1.2)
BUN / CREAT RATIO: 16 (ref 12–28)
BUN: 12 mg/dL (ref 8–27)
CO2: 31 mmol/L — AB (ref 20–29)
CREATININE: 0.77 mg/dL (ref 0.57–1.00)
Calcium: 10.2 mg/dL (ref 8.7–10.3)
Chloride: 96 mmol/L (ref 96–106)
GFR calc non Af Amer: 81 mL/min/{1.73_m2} (ref >59)
GFR, EST AFRICAN AMERICAN: 94 mL/min/{1.73_m2} (ref >59)
GLOBULIN, TOTAL: 3.6 g/dL (ref 1.5–4.5)
GLUCOSE: 125 mg/dL — AB (ref 65–99)
Potassium: 4.8 mmol/L (ref 3.5–5.2)
SODIUM: 138 mmol/L (ref 134–144)
TOTAL PROTEIN: 7.8 g/dL (ref 6.0–8.5)

## 2017-03-03 NOTE — Telephone Encounter (Signed)
[facility] number busy.

## 2017-03-04 ENCOUNTER — Other Ambulatory Visit: Payer: Self-pay | Admitting: *Deleted

## 2017-03-04 ENCOUNTER — Other Ambulatory Visit: Payer: Medicare Other

## 2017-03-04 DIAGNOSIS — Z1211 Encounter for screening for malignant neoplasm of colon: Secondary | ICD-10-CM

## 2017-03-05 LAB — FECAL OCCULT BLOOD, IMMUNOCHEMICAL: Fecal Occult Bld: POSITIVE — AB

## 2017-03-05 NOTE — Telephone Encounter (Signed)
Patient can not afford tresiba.  [facility] says it would cost her $500 plus.

## 2017-03-10 ENCOUNTER — Other Ambulatory Visit: Payer: Self-pay | Admitting: *Deleted

## 2017-03-10 ENCOUNTER — Other Ambulatory Visit: Payer: Medicare Other

## 2017-03-10 DIAGNOSIS — Z1211 Encounter for screening for malignant neoplasm of colon: Secondary | ICD-10-CM

## 2017-03-11 LAB — FECAL OCCULT BLOOD, IMMUNOCHEMICAL: Fecal Occult Bld: NEGATIVE

## 2017-03-16 NOTE — Telephone Encounter (Signed)
[name] [phone] insurance agent is calling to talk to nurse about patient rx insulin aspart protamine - aspart (NOVOLOG MIX 70/30 FLEXPEN) (70-30) 100 UNIT/ML FlexPen. Please call back

## 2017-03-18 ENCOUNTER — Telehealth: Payer: Self-pay | Admitting: *Deleted

## 2017-03-18 NOTE — Telephone Encounter (Signed)
Pt's Novolog is non-formulary Humulin 70/30 is on her formulary and Humalog 75/25 Please advise Pt uses [facility], please include Dx in Notes to pharmacy

## 2017-03-19 ENCOUNTER — Other Ambulatory Visit: Payer: Self-pay | Admitting: Family

## 2017-03-19 MED ORDER — INSULIN ISOPHANE & REGULAR (HUMAN 70-30)100 UNIT/ML KWIKPEN: 75.0000 [IU] | PEN_INJECTOR | Freq: Three times a day (TID) | SUBCUTANEOUS | 11 refills | Status: DC

## 2017-03-19 NOTE — Telephone Encounter (Signed)
Is Tyler Aas or Toujeo covered on her plan? I had ordered one of these and patients states it was several hundred dollars.    Novolog changed to Humulin. Will keep at 75 units TID. If we can get longer acting insulin on board we will start decrease her short acting insulin.  Strict low carb diet.

## 2017-03-19 NOTE — Addendum Note (Signed)
Addended by: Evelina Dun A on: 03/19/2017 03:34 PM   Modules accepted: Orders

## 2017-03-19 NOTE — Telephone Encounter (Signed)
1 sample available - pt aware

## 2017-03-20 ENCOUNTER — Other Ambulatory Visit: Payer: Self-pay | Admitting: Family

## 2017-03-20 MED ORDER — INSULIN LISPRO 200 UNIT/ML ~~LOC~~ SOPN: 75.0000 [IU] | PEN_INJECTOR | Freq: Three times a day (TID) | SUBCUTANEOUS | 2 refills | Status: DC

## 2017-03-20 NOTE — Telephone Encounter (Signed)
Prior authorization personnel is working on a formulary exception for [place] and Humalog to be sure the patient can get these medications at the lowest co-pay.

## 2017-03-20 NOTE — Telephone Encounter (Signed)
Prior authorization personnel is working on formulary changes to acquire cheaper co-pays.

## 2017-03-25 ENCOUNTER — Other Ambulatory Visit: Payer: Self-pay | Admitting: Family

## 2017-03-25 ENCOUNTER — Telehealth: Payer: Self-pay | Admitting: Family

## 2017-03-25 DIAGNOSIS — I10 Essential (primary) hypertension: Secondary | ICD-10-CM

## 2017-03-26 NOTE — Telephone Encounter (Signed)
Pt aware to pick up Humalog, documented and put in fridge

## 2017-03-27 ENCOUNTER — Telehealth: Payer: Self-pay | Admitting: Family

## 2017-03-27 ENCOUNTER — Encounter: Payer: Self-pay | Admitting: Family

## 2017-03-27 MED ORDER — INSULIN LISPRO 200 UNIT/ML ~~LOC~~ SOPN: 75.0000 [IU] | PEN_INJECTOR | Freq: Three times a day (TID) | SUBCUTANEOUS | 2 refills | Status: DC

## 2017-03-27 NOTE — Telephone Encounter (Signed)
Humalog Prescription sent to [facility] mail

## 2017-03-27 NOTE — Telephone Encounter (Signed)
Ok, pt was given the last 2 boxes of Humalog 100u/ml last night. Her total daily is 225 units. This is not enough for full dose today. It is $460 to pick up at [facility]?

## 2017-04-02 ENCOUNTER — Telehealth: Payer: Self-pay | Admitting: Family

## 2017-04-02 NOTE — Telephone Encounter (Signed)
In fridge pt aware 2 boxes

## 2017-04-08 DIAGNOSIS — R251 Tremor, unspecified: Secondary | ICD-10-CM | POA: Diagnosis not present

## 2017-04-08 DIAGNOSIS — T50904A Poisoning by unspecified drugs, medicaments and biological substances, undetermined, initial encounter: Secondary | ICD-10-CM | POA: Diagnosis not present

## 2017-04-09 ENCOUNTER — Telehealth: Payer: Self-pay | Admitting: Family

## 2017-04-09 DIAGNOSIS — E1151 Type 2 diabetes mellitus with diabetic peripheral angiopathy without gangrene: Secondary | ICD-10-CM

## 2017-04-09 NOTE — Telephone Encounter (Signed)
Have no samples. Takes 225 units qd

## 2017-04-09 NOTE — Telephone Encounter (Signed)
Insulin samples ready for pick up

## 2017-04-10 NOTE — Telephone Encounter (Signed)
Samples were given  °

## 2017-04-19 DIAGNOSIS — I10 Essential (primary) hypertension: Secondary | ICD-10-CM | POA: Diagnosis not present

## 2017-04-20 ENCOUNTER — Telehealth: Payer: Self-pay | Admitting: Family

## 2017-04-20 MED ORDER — HYDRALAZINE HCL 10 MG PO TABS: 10.0000 mg | ORAL_TABLET | Freq: Three times a day (TID) | ORAL | 0 refills | Status: DC

## 2017-04-20 NOTE — Telephone Encounter (Signed)
Hydralazine 10 mg 3 times daily as sent to her pharmacy.  Please have an appointment in the next 2-3 weeks with [facility].

## 2017-04-20 NOTE — Telephone Encounter (Signed)
PT SAYS bp IS COMING DOWN NOW AND SHE IS NOT GOING TO TAKE A PILL AT THIS POINT

## 2017-04-20 NOTE — Telephone Encounter (Signed)
She called EMS last night : BP high and DIZZY -   Some Readings include: 230/69-70 225/71 (called ems) 210/75-62 173/56-61  No new meds  Doing what she is supposed to. The newest insulin is: Tyler Aas - could this cause elevated bp?  Taking metoprolol 100 BID, lisinopril 20 BID, amlodipine 10 qhs (took an extra of 25 mg of metoprolol when it was high - that seemed to make it better)  She needs to know what she needs to do - aware HAwks is off today.   Sent to [facility] (back up for [place])    Pt aware to go to ER / call EMS with any pains or anything unusual

## 2017-04-23 NOTE — Telephone Encounter (Signed)
Forward to [place] please, I handled this while she was out of the office.

## 2017-05-04 DIAGNOSIS — D234 Other benign neoplasm of skin of scalp and neck: Secondary | ICD-10-CM | POA: Diagnosis not present

## 2017-05-04 DIAGNOSIS — Z85828 Personal history of other malignant neoplasm of skin: Secondary | ICD-10-CM | POA: Diagnosis not present

## 2017-05-10 ENCOUNTER — Other Ambulatory Visit: Payer: Self-pay | Admitting: Family

## 2017-05-10 DIAGNOSIS — I2510 Atherosclerotic heart disease of native coronary artery without angina pectoris: Secondary | ICD-10-CM

## 2017-05-10 DIAGNOSIS — I10 Essential (primary) hypertension: Secondary | ICD-10-CM

## 2017-05-10 DIAGNOSIS — E1151 Type 2 diabetes mellitus with diabetic peripheral angiopathy without gangrene: Secondary | ICD-10-CM

## 2017-05-10 DIAGNOSIS — I5031 Acute diastolic (congestive) heart failure: Secondary | ICD-10-CM

## 2017-05-10 DIAGNOSIS — I739 Peripheral vascular disease, unspecified: Secondary | ICD-10-CM

## 2017-05-11 DIAGNOSIS — Z1231 Encounter for screening mammogram for malignant neoplasm of breast: Secondary | ICD-10-CM | POA: Diagnosis not present

## 2017-05-16 ENCOUNTER — Other Ambulatory Visit: Payer: Self-pay | Admitting: Family

## 2017-05-28 DIAGNOSIS — H26491 Other secondary cataract, right eye: Secondary | ICD-10-CM | POA: Diagnosis not present

## 2017-05-28 DIAGNOSIS — E119 Type 2 diabetes mellitus without complications: Secondary | ICD-10-CM | POA: Diagnosis not present

## 2017-05-28 DIAGNOSIS — H25812 Combined forms of age-related cataract, left eye: Secondary | ICD-10-CM | POA: Diagnosis not present

## 2017-05-28 DIAGNOSIS — Z961 Presence of intraocular lens: Secondary | ICD-10-CM | POA: Diagnosis not present

## 2017-05-28 LAB — HM DIABETES EYE EXAM

## 2017-06-01 ENCOUNTER — Ambulatory Visit: Payer: Medicare Other | Admitting: Family

## 2017-06-02 ENCOUNTER — Encounter: Payer: Self-pay | Admitting: Family

## 2017-06-07 ENCOUNTER — Other Ambulatory Visit: Payer: Self-pay | Admitting: Family

## 2017-06-07 DIAGNOSIS — I10 Essential (primary) hypertension: Secondary | ICD-10-CM

## 2017-06-08 ENCOUNTER — Telehealth: Payer: Self-pay | Admitting: Family

## 2017-06-08 DIAGNOSIS — E782 Mixed hyperlipidemia: Secondary | ICD-10-CM

## 2017-06-08 MED ORDER — ROSUVASTATIN CALCIUM 20 MG PO TABS: 20.0000 mg | ORAL_TABLET | Freq: Every day | ORAL | 3 refills | Status: DC

## 2017-06-08 NOTE — Telephone Encounter (Signed)
done

## 2017-06-10 ENCOUNTER — Encounter: Payer: Self-pay | Admitting: Family

## 2017-06-10 ENCOUNTER — Ambulatory Visit (INDEPENDENT_AMBULATORY_CARE_PROVIDER_SITE_OTHER): Payer: Medicare Other | Admitting: Family

## 2017-06-10 VITALS — BP 145/57 | HR 52 | Temp 97.2°F | Ht 62.5 in | Wt 213.0 lb

## 2017-06-10 DIAGNOSIS — E785 Hyperlipidemia, unspecified: Secondary | ICD-10-CM | POA: Diagnosis not present

## 2017-06-10 DIAGNOSIS — I739 Peripheral vascular disease, unspecified: Secondary | ICD-10-CM | POA: Diagnosis not present

## 2017-06-10 DIAGNOSIS — Z1211 Encounter for screening for malignant neoplasm of colon: Secondary | ICD-10-CM

## 2017-06-10 DIAGNOSIS — Z1212 Encounter for screening for malignant neoplasm of rectum: Secondary | ICD-10-CM

## 2017-06-10 DIAGNOSIS — E1159 Type 2 diabetes mellitus with other circulatory complications: Secondary | ICD-10-CM | POA: Diagnosis not present

## 2017-06-10 DIAGNOSIS — E1169 Type 2 diabetes mellitus with other specified complication: Secondary | ICD-10-CM | POA: Diagnosis not present

## 2017-06-10 DIAGNOSIS — E1151 Type 2 diabetes mellitus with diabetic peripheral angiopathy without gangrene: Secondary | ICD-10-CM | POA: Diagnosis not present

## 2017-06-10 DIAGNOSIS — I10 Essential (primary) hypertension: Secondary | ICD-10-CM | POA: Diagnosis not present

## 2017-06-10 DIAGNOSIS — E66811 Obesity, class 1: Secondary | ICD-10-CM | POA: Insufficient documentation

## 2017-06-10 DIAGNOSIS — I5032 Chronic diastolic (congestive) heart failure: Secondary | ICD-10-CM | POA: Diagnosis not present

## 2017-06-10 DIAGNOSIS — I152 Hypertension secondary to endocrine disorders: Secondary | ICD-10-CM

## 2017-06-10 DIAGNOSIS — I2510 Atherosclerotic heart disease of native coronary artery without angina pectoris: Secondary | ICD-10-CM

## 2017-06-10 DIAGNOSIS — I252 Old myocardial infarction: Secondary | ICD-10-CM

## 2017-06-10 LAB — BAYER DCA HB A1C WAIVED: HB A1C: 7.4 % — AB (ref <7.0)

## 2017-06-10 NOTE — Patient Instructions (Signed)
Diabetes Mellitus and Nutrition When you have diabetes (diabetes mellitus), it is very important to have healthy eating habits because your blood sugar (glucose) levels are greatly affected by what you eat and drink. Eating healthy foods in the appropriate amounts, at about the same times every day, can help you:  Control your blood glucose.  Lower your risk of heart disease.  Improve your blood pressure.  Reach or maintain a healthy weight.  Every person with diabetes is different, and each person has different needs for a meal plan. Your health care provider may recommend that you work with a diet and nutrition specialist (dietitian) to make a meal plan that is best for you. Your meal plan may vary depending on factors such as:  The calories you need.  The medicines you take.  Your weight.  Your blood glucose, blood pressure, and cholesterol levels.  Your activity level.  Other health conditions you have, such as heart or kidney disease.  How do carbohydrates affect me? Carbohydrates affect your blood glucose level more than any other type of food. Eating carbohydrates naturally increases the amount of glucose in your blood. Carbohydrate counting is a method for keeping track of how many carbohydrates you eat. Counting carbohydrates is important to keep your blood glucose at a healthy level, especially if you use insulin or take certain oral diabetes medicines. It is important to know how many carbohydrates you can safely have in each meal. This is different for every person. Your dietitian can help you calculate how many carbohydrates you should have at each meal and for snack. Foods that contain carbohydrates include:  Bread, cereal, rice, pasta, and crackers.  Potatoes and corn.  Peas, beans, and lentils.  Milk and yogurt.  Fruit and juice.  Desserts, such as cakes, cookies, ice cream, and candy.  How does alcohol affect me? Alcohol can cause a sudden decrease in blood  glucose (hypoglycemia), especially if you use insulin or take certain oral diabetes medicines. Hypoglycemia can be a life-threatening condition. Symptoms of hypoglycemia (sleepiness, dizziness, and confusion) are similar to symptoms of having too much alcohol. If your health care provider says that alcohol is safe for you, follow these guidelines:  Limit alcohol intake to no more than 1 drink per day for nonpregnant women and 2 drinks per day for men. One drink equals 12 oz of beer, 5 oz of wine, or 1 oz of hard liquor.  Do not drink on an empty stomach.  Keep yourself hydrated with water, diet soda, or unsweetened iced tea.  Keep in mind that regular soda, juice, and other mixers may contain a lot of sugar and must be counted as carbohydrates.  What are tips for following this plan? Reading food labels  Start by checking the serving size on the label. The amount of calories, carbohydrates, fats, and other nutrients listed on the label are based on one serving of the food. Many foods contain more than one serving per package.  Check the total grams (g) of carbohydrates in one serving. You can calculate the number of servings of carbohydrates in one serving by dividing the total carbohydrates by 15. For example, if a food has 30 g of total carbohydrates, it would be equal to 2 servings of carbohydrates.  Check the number of grams (g) of saturated and trans fats in one serving. Choose foods that have low or no amount of these fats.  Check the number of milligrams (mg) of sodium in one serving. Most people   should limit total sodium intake to less than 2,300 mg per day.  Always check the nutrition information of foods labeled as "low-fat" or "nonfat". These foods may be higher in added sugar or refined carbohydrates and should be avoided.  Talk to your dietitian to identify your daily goals for nutrients listed on the label. Shopping  Avoid buying canned, premade, or processed foods. These  foods tend to be high in fat, sodium, and added sugar.  Shop around the outside edge of the grocery store. This includes fresh fruits and vegetables, bulk grains, fresh meats, and fresh dairy. Cooking  Use low-heat cooking methods, such as baking, instead of high-heat cooking methods like deep frying.  Cook using healthy oils, such as olive, canola, or sunflower oil.  Avoid cooking with butter, cream, or high-fat meats. Meal planning  Eat meals and snacks regularly, preferably at the same times every day. Avoid going long periods of time without eating.  Eat foods high in fiber, such as fresh fruits, vegetables, beans, and whole grains. Talk to your dietitian about how many servings of carbohydrates you can eat at each meal.  Eat 4-6 ounces of lean protein each day, such as lean meat, chicken, fish, eggs, or tofu. 1 ounce is equal to 1 ounce of meat, chicken, or fish, 1 egg, or 1/4 cup of tofu.  Eat some foods each day that contain healthy fats, such as avocado, nuts, seeds, and fish. Lifestyle   Check your blood glucose regularly.  Exercise at least 30 minutes 5 or more days each week, or as told by your health care provider.  Take medicines as told by your health care provider.  Do not use any products that contain nicotine or tobacco, such as cigarettes and e-cigarettes. If you need help quitting, ask your health care provider.  Work with a counselor or diabetes educator to identify strategies to manage stress and any emotional and social challenges. What are some questions to ask my health care provider?  Do I need to meet with a diabetes educator?  Do I need to meet with a dietitian?  What number can I call if I have questions?  When are the best times to check my blood glucose? Where to find more information:  American Diabetes Association: diabetes.org/food-and-fitness/food  Academy of Nutrition and Dietetics:  www.eatright.org/resources/health/diseases-and-conditions/diabetes  National Institute of Diabetes and Digestive and Kidney Diseases (NIH): www.niddk.nih.gov/health-information/diabetes/overview/diet-eating-physical-activity Summary  A healthy meal plan will help you control your blood glucose and maintain a healthy lifestyle.  Working with a diet and nutrition specialist (dietitian) can help you make a meal plan that is best for you.  Keep in mind that carbohydrates and alcohol have immediate effects on your blood glucose levels. It is important to count carbohydrates and to use alcohol carefully. This information is not intended to replace advice given to you by your health care provider. Make sure you discuss any questions you have with your health care provider. Document Released: 10/31/2004 Document Revised: 03/10/2016 Document Reviewed: 03/10/2016 Elsevier Interactive Patient Education  2018 Elsevier Inc.  

## 2017-06-10 NOTE — Progress Notes (Signed)
Subjective:    Patient ID: Leah Ferrell, female    DOB: 10/05/1950, 67 y.o.   MRN: [id number]  Pt presents to the office today for chronic follow up. Pt has CHF, CAD and hx MI and is followed by Cardiologists every 6 months. PT is followed by Vascular for PVD and PAD. PT states these are stable. Diabetes  She presents for her follow-up diabetic visit. She has type 2 diabetes mellitus. Her disease course has been worsening. There are no hypoglycemic associated symptoms. Pertinent negatives for diabetes include no blurred vision, no fatigue, no foot paresthesias and no visual change. Symptoms are stable. Diabetic complications include a CVA and heart disease. Pertinent negatives for diabetic complications include no peripheral neuropathy. Risk factors for coronary artery disease include diabetes mellitus, family history, dyslipidemia, obesity, hypertension, sedentary lifestyle and post-menopausal. She is following a generally unhealthy diet. Her overall blood glucose range is 140-180 mg/dl.  Hypertension  This is a chronic problem. The current episode started more than 1 year ago. The problem has been resolved since onset. The problem is uncontrolled. Associated symptoms include malaise/fatigue. Pertinent negatives include no blurred vision, peripheral edema or shortness of breath. Risk factors for coronary artery disease include diabetes mellitus, dyslipidemia, family history and obesity. The current treatment provides mild improvement. Hypertensive end-organ damage includes CVA.  Hyperlipidemia  This is a chronic problem. The current episode started more than 1 year ago. The problem is controlled. Recent lipid tests were reviewed and are normal. Exacerbating diseases include obesity. Pertinent negatives include no shortness of breath. Current antihyperlipidemic treatment includes statins. The current treatment provides moderate improvement of lipids. Risk factors for coronary artery disease include  diabetes mellitus, hypertension and a sedentary lifestyle.  Congestive Heart Failure  Presents for follow-up visit. Pertinent negatives include no fatigue, nocturia, shortness of breath or unexpected weight change. The symptoms have been stable.      Review of Systems  Constitutional: Positive for malaise/fatigue. Negative for fatigue and unexpected weight change.  Eyes: Negative for blurred vision.  Respiratory: Negative for shortness of breath.   Genitourinary: Negative for nocturia.  All other systems reviewed and are negative.      Objective:   Physical Exam  Constitutional: She is oriented to person, place, and time. She appears well-developed and well-nourished. No distress.  HENT:  Head: Normocephalic and atraumatic.  Right Ear: External ear normal.  Left Ear: External ear normal.  Nose: Nose normal.  Mouth/Throat: Oropharynx is clear and moist.  Eyes: Pupils are equal, round, and reactive to light.  Neck: Normal range of motion. Neck supple. No thyromegaly present.  Cardiovascular: Normal rate, regular rhythm, normal heart sounds and intact distal pulses.  No murmur heard. Pulmonary/Chest: Effort normal and breath sounds normal. No respiratory distress. She has no wheezes.  Abdominal: Soft. Bowel sounds are normal. She exhibits no distension. There is no tenderness.  Musculoskeletal: Normal range of motion. She exhibits edema (trace BLE). She exhibits no tenderness.  Slight discoloration of bilateral legs related to PVD  Neurological: She is alert and oriented to person, place, and time.  Skin: Skin is warm and dry.  Psychiatric: She has a normal mood and affect. Her behavior is normal. Judgment and thought content normal.  Vitals reviewed.    BP (!) 145/57   Pulse (!) 52   Temp (!) 97.2 F (36.2 C) (Oral)   Ht 5' 2.5" (1.588 m)   Wt 213 lb (96.6 kg)   BMI 38.34 kg/m  Assessment & Plan:  1. Hypertension associated with diabetes ([place]) - CMP14+EGFR  2.  Diabetes mellitus with peripheral vascular disease ([place]) - Bayer DCA Hb A1c Waived - CMP14+EGFR  3. Coronary artery disease involving native coronary artery of native heart without angina pectoris - CMP14+EGFR  4. PVD (peripheral vascular disease) (H[facility]) - CMP14+EGFR  5. Chronic diastolic heart failure (HCC) - CMP14+EGFR  6. PAD (peripheral artery disease) (HCC) - CMP14+EGFR  7. Hyperlipidemia associated with type 2 diabetes mellitus (HCC) - CMP14+EGFR - Lipid panel  8. Hx of non-ST elevation myocardial infarction (NSTEMI) - CMP14+EGFR  9. Colon cancer screening - Cologuard - CMP14+EGFR  10. Screening for malignant neoplasm of the rectum - Cologuard - CMP14+EGFR  11. Morbid obesity ([place])   Continue all meds Labs pending Health Maintenance reviewed Diet and exercise encouraged RTO 3 months   Evelina Dun, FNP

## 2017-06-11 LAB — LIPID PANEL
Chol/HDL Ratio: 2.5 ratio (ref 0.0–4.4)
Cholesterol, Total: 175 mg/dL (ref 100–199)
HDL: 69 mg/dL
LDL Calculated: 84 mg/dL (ref 0–99)
Triglycerides: 108 mg/dL (ref 0–149)
VLDL Cholesterol Cal: 22 mg/dL (ref 5–40)

## 2017-06-11 LAB — CMP14+EGFR
ALT: 25 IU/L (ref 0–32)
AST: 23 IU/L (ref 0–40)
Albumin/Globulin Ratio: 1.3 (ref 1.2–2.2)
Albumin: 4.2 g/dL (ref 3.6–4.8)
Alkaline Phosphatase: 62 IU/L (ref 39–117)
BUN/Creatinine Ratio: 14 (ref 12–28)
BUN: 10 mg/dL (ref 8–27)
Bilirubin Total: 0.3 mg/dL (ref 0.0–1.2)
CO2: 31 mmol/L — ABNORMAL HIGH (ref 20–29)
Calcium: 9.8 mg/dL (ref 8.7–10.3)
Chloride: 96 mmol/L (ref 96–106)
Creatinine, Ser: 0.7 mg/dL (ref 0.57–1.00)
GFR calc Af Amer: 104 mL/min/1.73
GFR calc non Af Amer: 91 mL/min/1.73
Globulin, Total: 3.3 g/dL (ref 1.5–4.5)
Glucose: 154 mg/dL — ABNORMAL HIGH (ref 65–99)
Potassium: 4.4 mmol/L (ref 3.5–5.2)
Sodium: 141 mmol/L (ref 134–144)
Total Protein: 7.5 g/dL (ref 6.0–8.5)

## 2017-06-14 ENCOUNTER — Other Ambulatory Visit: Payer: Self-pay | Admitting: Family

## 2017-07-15 ENCOUNTER — Telehealth: Payer: Self-pay | Admitting: Family

## 2017-07-15 NOTE — Telephone Encounter (Signed)
Pt just called and stated that her insulin was just delivered and would not need sample

## 2017-07-15 NOTE — Telephone Encounter (Signed)
Aware. One sample available. 

## 2017-07-19 ENCOUNTER — Other Ambulatory Visit: Payer: Self-pay | Admitting: Family

## 2017-07-29 ENCOUNTER — Encounter: Payer: Self-pay | Admitting: Cardiology

## 2017-07-29 ENCOUNTER — Ambulatory Visit (INDEPENDENT_AMBULATORY_CARE_PROVIDER_SITE_OTHER): Payer: Medicare Other | Admitting: Cardiology

## 2017-07-29 VITALS — BP 149/66 | HR 55 | Ht 62.5 in | Wt 216.6 lb

## 2017-07-29 DIAGNOSIS — I739 Peripheral vascular disease, unspecified: Secondary | ICD-10-CM | POA: Diagnosis not present

## 2017-07-29 DIAGNOSIS — E782 Mixed hyperlipidemia: Secondary | ICD-10-CM

## 2017-07-29 DIAGNOSIS — I2510 Atherosclerotic heart disease of native coronary artery without angina pectoris: Secondary | ICD-10-CM | POA: Diagnosis not present

## 2017-07-29 DIAGNOSIS — I10 Essential (primary) hypertension: Secondary | ICD-10-CM | POA: Diagnosis not present

## 2017-07-29 NOTE — Patient Instructions (Signed)
Medication Instructions:  Your physician recommends that you continue on your current medications as directed. Please refer to the Current Medication list given to you today.  Labwork: NONE  Testing/Procedures: NONE  Follow-Up: Your physician wants you to follow-up in: 6 MONTHS WITH DR. BRANCH. You will receive a reminder letter in the mail two months in advance. If you don't receive a letter, please call our office to schedule the follow-up appointment.  Any Other Special Instructions Will Be Listed Below (If Applicable).  If you need a refill on your cardiac medications before your next appointment, please call your pharmacy. 

## 2017-07-29 NOTE — Progress Notes (Signed)
Clinical Summary Ms. Roskelley is a 67 y.o.female  seen today for follow up of the following medical problems.   1. CAD  - prior NSTEMI 04/2012 with DES to LAD  - 07/21/2013 cath at [facility] after abnormal nuclear stress test. Succesfull pci to LAD with DES.  - 03/2016 echo LVEF 65-70%, no WMAs.  - Jan 2018 nuclear stress: no ischemia.   - no exertional chest pain. No SOB or DOE.  - compliant with meds   2. PAD  - followed by Dr Fletcher [name].    3. Chronic diastolic heart failure  - can have some swelling at times. Has lasix prn as needed.   4. HTN  - compliant with meds  5. Hyperlipidemia   05/2017 TC 175 TG 108 HDL 69 LDL 84 - not interested in icnrased crestor.     JZ:PHXTA recently from [place] to [place] in late May, a house with 3 acres.     Past Medical History:  Diagnosis Date  . CAD (coronary artery disease)    a. 04/2012 NSTEMI: s/p DES to LAD.  Marland Kitchen Chronic diastolic CHF (congestive heart failure) ([place])   . Diabetes mellitus without complication ([place])   . Dysrhythmia   . Environmental and seasonal allergies   . Hyperlipidemia   . Hypertension   . Leukocytosis   . Morbid obesity ([place])   . NSTEMI (non-ST elevated myocardial infarction) ([place]) 04/27/2012  . PONV (postoperative nausea and vomiting)   . PVD (peripheral vascular disease) ([place])    a. 04/2012 ABI: R 0.49, L 0.39. Angiography 06/14: Significant ostial left common iliac artery stenosis extending into the distal aorta, severe left common femoral artery stenosis, bilateral SFA occlusion with heavy calcifications. Functionally, one-vessel runoff bilaterally below the knee  . Shortness of breath      Allergies  Allergen Reactions  . Tape Rash     Current Outpatient Medications  Medication Sig Dispense Refill  . albuterol (PROVENTIL HFA) 108 (90 Base) MCG/ACT inhaler INHALE ONE TO TWO PUFFS BY  MOUTH EVERY 6 HOURS AS  NEEDED FOR WHEEZING OR FOR  SHORTNESS OF BREATH 13.4 g 2   . amLODipine (NORVASC) 10 MG tablet TAKE 1 TABLET BY MOUTH  DAILY 90 tablet 1  . Ascorbic Acid (VITAMIN C) 1000 MG tablet Take 1,000 mg by mouth 2 (two) times daily.    Marland Kitchen aspirin EC 81 MG tablet Take 81 mg by mouth daily.     . Calcium Carbonate-Vit D-Min (CALTRATE 600+D PLUS MINERALS) 600-800 MG-UNIT TABS Take 1 tablet by mouth 3 (three) times daily.     . clopidogrel (PLAVIX) 75 MG tablet TAKE 1 TABLET BY MOUTH  DAILY 90 tablet 0  . fish oil-omega-3 fatty acids 1000 MG capsule Take 1 g by mouth every morning.    . furosemide (LASIX) 40 MG tablet TAKE 1 TABLET DAILY MAY TAKE ADDITIONAL TABLET AS NEEDED FOR SWELLING 180 tablet 1  . HUMALOG KWIKPEN 200 UNIT/ML SOPN INJECT 75 UNITS INTO THE  SKIN 3 TIMES DAILY 36 mL 1  . hydrALAZINE (APRESOLINE) 10 MG tablet Take 1 tablet (10 mg total) by mouth 3 (three) times daily. 90 tablet 0  . hydroxypropyl methylcellulose (ISOPTO TEARS) 2.5 % ophthalmic solution Place 1 drop into both eyes 3 (three) times daily as needed (for dry eyes).    . Insulin Pen Needle (RELION PEN NEEDLES) 29G X 12MM MISC Three times a day 1000 each 12  . lisinopril (PRINIVIL,ZESTRIL) 20 MG tablet TAKE 2  TABLETS BY MOUTH  DAILY 180 tablet 0  . metoprolol tartrate (LOPRESSOR) 100 MG tablet Take 1 tablet (100 mg total) by mouth 2 (two) times daily. 180 tablet 3  . montelukast (SINGULAIR) 10 MG tablet TAKE 1 TABLET BY MOUTH  DAILY WITH BREAKFAST 90 tablet 0  . nitroGLYCERIN (NITROSTAT) 0.4 MG SL tablet Place 1 tablet (0.4 mg total) under the tongue every 5 (five) minutes x 3 doses as needed for chest pain (up to 3 doses). 25 tablet 4  . potassium chloride SA (K-DUR,KLOR-CON) 20 MEQ tablet TAKE 1 TABLET BY MOUTH  DAILY 90 tablet 1  . RELION PEN NEEDLES 29G X 12MM MISC USE 1 NEEDLE TO INJECT INSULIN SUBCUTANEOUSLY 3 TIMES DAILY 50 each 5  . rosuvastatin (CRESTOR) 20 MG tablet Take 1 tablet (20 mg total) by mouth daily. 90 tablet 3   No current facility-administered medications for this  visit.      Past Surgical History:  Procedure Laterality Date  . ABDOMINAL AORTAGRAM N/A 07/21/2012   Procedure: ABDOMINAL Maxcine Ham;  Surgeon: Wellington Hampshire, MD;  Location: [place] CATH LAB;  Service: Cardiovascular;  Laterality: N/A;  . CARDIAC CATHETERIZATION     stents X 2  . CORONARY ANGIOGRAM  04/27/2012   Procedure: CORONARY ANGIOGRAM;  Surgeon: Thayer Headings, MD;  Location: [facility] CATH LAB;  Service: Cardiovascular;;  . ENDARTERECTOMY FEMORAL Left 11/02/2013   Procedure: RESECTION LEFT COMMON FEMORAL ARTERY AND INSERTION OF INTERPOSITION 42mm HEMASHIELD GRAFT FROM LEFT EXTERNAL  ILIAC ARTERY TO LEFT PROFUNDA  FEMORIS ARTERY;  Surgeon: Mal Misty, MD;  Location: [place];  Service: Vascular;  Laterality: Left;  . EYE SURGERY Right 2017  . FRACTURE SURGERY Right Jul 07, 2014   Right Foot-  Pt.fell  at Home  by Heat duct  . ILIAC ARTERY STENT Left 08/31/2013  . LAD stent    . LEFT HEART CATHETERIZATION WITH CORONARY ANGIOGRAM N/A 04/23/2012   Procedure: LEFT HEART CATHETERIZATION WITH CORONARY ANGIOGRAM;  Surgeon: Peter M Martinique, MD;  Location: [facility] CATH LAB;  Service: Cardiovascular;  Laterality: N/A;  . LOWER EXTREMITY ANGIOGRAM Left 08/31/2013   Procedure: LOWER EXTREMITY ANGIOGRAM;  Surgeon: Wellington Hampshire, MD;  Location: [place] CATH LAB;  Service: Cardiovascular;  Laterality: Left;  . PERCUTANEOUS STENT INTERVENTION Left 08/31/2013   Procedure: PERCUTANEOUS STENT INTERVENTION;  Surgeon: Wellington Hampshire, MD;  Location: [place] CATH LAB;  Service: Cardiovascular;  Laterality: Left;  Common Iliac artery  . PERIPHERAL VASCULAR CATHETERIZATION N/A 03/19/2016   Procedure: Abdominal Aortogram w/Lower Extremity;  Surgeon: Wellington Hampshire, MD;  Location: [place] CV LAB;  Service: Cardiovascular;  Laterality: N/A;  . PERIPHERAL VASCULAR CATHETERIZATION  03/19/2016   Procedure: Peripheral Vascular Balloon Angioplasty-CFA Left;  Surgeon: Wellington Hampshire, MD;  Location: [place] CV LAB;  Service:  Cardiovascular;;  . 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 Tape Rash      Family History  Problem Relation Age of Onset  . Diabetes Mother   . Hyperlipidemia Mother   . Hypertension Mother   . Peripheral vascular disease Mother   . Diabetes Maternal Grandmother   . Heart attack Maternal Grandfather   . Heart disease Brother   . Other Sister        drowned  . Diabetes Brother   . Alcohol abuse Brother   . Other Sister        drowned  . Diabetes  Daughter        borderline  . Hypertension Daughter   . Diabetes Son   . Hypertension Son   . Hyperlipidemia Son      Social History Ms. Pless reports that she quit smoking about 16 years ago. Her smoking use included cigarettes. She started smoking about 53 years ago. She has a 36.00 pack-year smoking history. She has never used smokeless tobacco. Ms. Staunton reports that she does not drink alcohol.   Review of Systems CONSTITUTIONAL: No weight loss, fever, chills, weakness or fatigue.  HEENT: Eyes: No visual loss, blurred vision, double vision or yellow sclerae.No hearing loss, sneezing, congestion, runny nose or sore throat.  SKIN: No rash or itching.  CARDIOVASCULAR: per hpi RESPIRATORY: No shortness of breath, cough or sputum.  GASTROINTESTINAL: No anorexia, nausea, vomiting or diarrhea. No abdominal pain or blood.  GENITOURINARY: No burning on urination, no polyuria NEUROLOGICAL: No headache, dizziness, syncope, paralysis, ataxia, numbness or tingling in the extremities. No change in bowel or bladder control.  MUSCULOSKELETAL: No muscle, back pain, joint pain or stiffness.  LYMPHATICS: No enlarged nodes. No history of splenectomy.  PSYCHIATRIC: No history of depression or anxiety.  ENDOCRINOLOGIC: No reports of sweating, cold or heat intolerance. No polyuria or polydipsia.  Marland Kitchen   Physical Examination Vitals:   07/29/17 1301  BP: (!) 149/66  Pulse: (!) 55  SpO2: 94%     Vitals:   07/29/17 1301  Weight: 216 lb 9.6 oz (98.2 kg)  Height: 5' 2.5" (1.588 m)    Gen: resting comfortably, no acute distress HEENT: no scleral icterus, pupils equal round and reactive, no palptable cervical adenopathy,  CV: RRR, no m/r/g, no jvd Resp: Clear to auscultation bilaterally GI: abdomen is soft, non-tender, non-distended, normal bowel sounds, no hepatosplenomegaly MSK: extremities are warm, no edema.  Skin: warm, no rash Neuro:  no focal deficits Psych: appropriate affect   Diagnostic Studies 04/2012 Echo: LVEF 55-60%, no WMA, mild LVH, grade I diastolic dysfunction  0/8657 ABI: Right 0.57 left 0.48. Moderate left iliac disease, distal right SFA occlusion with reconstitution at the popliteal.  04/2012 Carotid US: bilateral plaque without significant stenosis.  04/2012 Cardiac Cath PROCEDURAL FINDINGS  Hemodynamics:  AO 155/58 mean 91 mm Hg  LV 163/13 mm Hg  Coronary angiography:  Coronary dominance: right  Left mainstem: Short with shared coronary ostia for the LAD and LCX.  Left anterior descending (LAD): diffuse 30-40% proximal. 90% focal lesion after the first diagonal.  Left circumflex (LCx): <20% proximal and mid. 80% very small distal OM.  Right coronary artery (RCA): Diffuse 20% proximal and mid vessel disease. Moderate calcification.  Left ventriculography: Left ventricular systolic function is normal, LVEF is estimated at 55-65%, there is no significant mitral regurgitation  PCI Note: Following the diagnostic procedure, the decision was made to proceed with PCI. The radial sheath was upsized to a 6 [place]. Bilinta 180 mg was given orally. Weight-based bivalirudin was given for anticoagulation. Once a therapeutic ACT was achieved, a 6 [place] XB 3.0 guide catheter was inserted. A prowater coronary guidewire was used to cross the lesion. The lesion was predilated with a 2.5 mm balloon. The lesion was then stented with a 3.0x 16 mm Promus premier  stent. The stent was postdilated with a 3.25 mm noncompliant balloon. Following PCI, there was 0% residual stenosis and TIMI-3 flow. Final angiography confirmed an excellent result. The patient tolerated the procedure well. There were no immediate procedural complications. A TR band was used for  radial hemostasis. The patient was transferred to the post catheterization recovery area for further monitoring.  PCI Data:  Vessel - LAD/Segment - mid  Percent Stenosis (pre) 90%  TIMI-flow 3  Stent 3.0 x 16 mm Promus premier  Percent Stenosis (post) 0%  TIMI-flow (post) 3  Final Conclusions:  1. Single vessel obstructive CAD  2. Normal LV function.  3. Successful stenting of the mid LAD with a DES.   07/2012 Lower Extremity Angiography Hemodynamics: Central Aortic Pressure / Mean Aortic Pressure: 157/70  Findings:   Abdominal aorta: Normal in size with no aneurysm. There is mild atherosclerosis below the renal arteries. Distally above the iliac bifurcation there is a calcified plaque at the origin of the left external iliac causing 30% stenosis.   Left renal artery: Normal   Right renal artery: Normal   Celiac artery: Patent   Superior mesenteric artery: Patent   Right common iliac artery: Diffuse 20% disease.   Right internal iliac artery: Normal   Right external iliac artery: Normal   Right common femoral artery: Minor irregularities.   Right profunda femoral artery: Diffuse 20% disease proximally.   Right superficial femoral artery: Diffuse atherosclerosis throughout its course. There is 20% disease at the ostium. This is followed by 70% stenosis proximally. There is another 70% stenosis in the midsegment followed by an occlusion distally Reconstituting in the proximal popliteal artery. This segment is about 50 mm and heavily calcified.   Right popliteal artery: 20-30% disease proximally.   Right tibial peroneal trunk: Normal   Right anterior  tibial artery: Diffusely diseased proximally and seems to be occluded in the midsegment.   Right peroneal artery: Patent but diffusely diseased.   Right posterior tibial artery: Patent and the dominant vessel below the knee. It has minor irregularities.   Left common iliac artery: There is a 70-80 % heavily calcified lesion proximally.   Left internal iliac artery: Diffusely diseased.   Left external iliac artery: Minor irregularities.   Left common femoral artery: Heavily calcified with diffuse 90% disease extending into the ostium of the SFA.   Left profunda femoral artery: Normal.   Left superficial femoral artery: 70% ostial disease with heavy calcifications. Diffuse 40% disease proximally. The vessel is occluded in the midsegment with reconstitution distally via collaterals from the profunda.   Left popliteal artery: Minor irregularities.   Left tibial peroneal trunk: Minor irregularities.   Left anterior tibial artery: Patent. This is the dominant vessel.   Left peroneal artery: Patent but diffusely diseased.   Left posterior tibial artery: Occlude proximally. Conclusions: 1. Significant heavily calcified ostial left common iliac artery stenosis with plaque extending into the distal aorta.  2. Severe heavily calcified disease in the left common femoral artery.  3. Bilateral SFA occlusion with heavy calcifications.  4. Significant below the knee disease bilaterally as outlined above.  Recommendations:  The patient has diffuse and heavily calcified peripheral arterial disease. Revascularization options are somewhat difficult by endovascular means. There is heavily calcified disease in the left common femoral artery which is not approachable by endovascular options. Endarterectomy is the best option for this. The stenosis in the left common iliac artery can be treated with stenting but will require likely stenting the distal aorta as well. Both SFAs are  diffusely diseased, calcified with chronic occlusion. We'll attempt medical therapy and a walking program for now.   11/2012 Labs: 140 K 4.2 Cl 97 Cr 0.75 BUN 9 AST 16 ALT 21 T.bili 0.2 TC 205 TG 169  HDL 70 LDL 101  01/2013 Event Monitor No symptoms, no arrythmias  06/2013 Lexiscan MPI Large reversible defect in the basal anterior, basal anteroseptal, mid anterior, mid anteroseptal, apical anteior, apical segments mild to moderate.  07/21/13 Cath Hemodynamic Measurements Pressures:  - AO 130 / 56. - LV systolic 993 LV End-diastolic 13.  Angiographic Findings: Left main: Normal Left anterior descending: There is 70% stenosis at the proximal segment, first diagonal is intermediate in size, there is a stent in mid LAD which is patent. Left circumflex: Angiographically normal Right coronary artery: There is 30% narrowing in the mid segment. Right dominant  Left ventriculogram: Not done.   INTERVENTIONAL PROCEDURE NARRATIVE:  Intra-operative Anticoagulation: Additional dose of 2500 heparin IV  Equipment - Guide Catheters: 6 [place] EBU 3 - Guide Wires: Run-through  Procedure Details: I advanced a 3.0 x 12 balloon and predilated the lesion up to 14 atmospheres for 20 seconds and then I repeated that for another 14 atmospheres for 20 seconds. Then I advanced a Promus 3.5 x 16 drug-eluting stent and deployed it at the ostium and  proximal LAD with extreme care not to involve the ostium of left circumflex. I deployed the stent at 16 atmospheres, for 45 seconds, enlarging the stent to 3.7 mm. Post deployment angiography showed excellent results no complications. I gave the patient  Effient 60 mg, removed the catheter and used a TR band to obtain hemostasis.Marland Kitchen  Post-operative Anticoagulation: Aspirin and Effient  Complications: None  IMPRESSION: 1. Successful PCI to proximal LAD with a Promus 3.5 x 16 drug-eluting stent, postdilated to 3.7 mm. 2. Patent mid LAD stent. 3. Normal  left ventricular end-diastolic pressure of 13 mm Hg. 4. Coronary angiography and percutaneous coronary intervention performed with access through the right radial artery.  PLAN: 1. Aspirin 81 mg and Effient 10 mg daily for at least one year 2. Continue medical management of coronary artery disease. 3. Due to the fact that patient has significant life style limiting Rutherford class III claudication, I will proceed with PVR and duplex study. 4. Followup with Dr. Hamilton Capri.    Jan 2018 Nuclear stress  Nuclear stress EF: 59%.  The study is normal.  This is a low risk study.  There was no ST segment deviation noted during stress.  Low risk stress nuclear study with normal perfusion and normal left ventricular regional and global systolic function.    03/2016 echo Study Conclusions  - Left ventricle: The cavity size was normal. Wall thickness was increased in a pattern of severe LVH. Systolic function was vigorous. The estimated ejection fraction was in the range of 65% to 70%. Wall motion was normal; there were no regional wall motion abnormalities. Doppler parameters are consistent with abnormal left ventricular relaxation (grade 1 diastolic dysfunction). Doppler parameters are consistent with high ventricular filling pressure. - Mitral valve: Calcified annulus. Mildly thickened leaflets . - Left atrium: The atrium was moderately dilated.  Impressions:  - Vigorous LV systolic function; severe LVH; grade 1 diastolic dysfunction with elevated LV filling pressure; sclerotic aortic valve; moderate LAE.      Assessment and Plan  1. CAD - no symptoms, continue current meds  2. PAD - continue to follow with Dr Fletcher [name] and vascular  3. Chronic diastolic HF - doing well, continue current meds  4. HTN - manual recheck 132/80, continue current meds  5. Hyperlipidemia - LDL above goal of 70, she is resistant to increase crestor at this  time - cuntinue current statin.  F/u 6 months   Arnoldo Lenis, M.D.

## 2017-07-30 ENCOUNTER — Telehealth: Payer: Self-pay | Admitting: *Deleted

## 2017-07-30 MED ORDER — ROSUVASTATIN CALCIUM 40 MG PO TABS: 40.0000 mg | ORAL_TABLET | Freq: Every day | ORAL | 1 refills | Status: DC

## 2017-07-30 NOTE — Telephone Encounter (Signed)
Pt aware and new rx sent to [facility] as requested

## 2017-07-30 NOTE — Telephone Encounter (Signed)
-----   Message from Arnoldo Lenis, MD sent at 07/30/2017  3:10 PM EDT ----- Yes, please increase crestor to 40mg  daily  Zandra Abts MD ----- Message ----- From: Massie Maroon, CMA Sent: 07/30/2017   2:20 PM To: Arnoldo Lenis, MD  Pt says she has thought about it and would be ok with increase of Crestor - is this going to be increased to 40 mg ?   [place]

## 2017-08-02 ENCOUNTER — Other Ambulatory Visit: Payer: Self-pay | Admitting: Family

## 2017-08-02 DIAGNOSIS — I739 Peripheral vascular disease, unspecified: Secondary | ICD-10-CM

## 2017-08-02 DIAGNOSIS — I5031 Acute diastolic (congestive) heart failure: Secondary | ICD-10-CM

## 2017-08-02 DIAGNOSIS — E1151 Type 2 diabetes mellitus with diabetic peripheral angiopathy without gangrene: Secondary | ICD-10-CM

## 2017-08-02 DIAGNOSIS — I2510 Atherosclerotic heart disease of native coronary artery without angina pectoris: Secondary | ICD-10-CM

## 2017-08-04 ENCOUNTER — Telehealth: Payer: Self-pay | Admitting: Cardiovascular Disease

## 2017-08-04 DIAGNOSIS — D485 Neoplasm of uncertain behavior of skin: Secondary | ICD-10-CM | POA: Diagnosis not present

## 2017-08-04 DIAGNOSIS — I739 Peripheral vascular disease, unspecified: Secondary | ICD-10-CM

## 2017-08-04 DIAGNOSIS — L918 Other hypertrophic disorders of the skin: Secondary | ICD-10-CM | POA: Diagnosis not present

## 2017-08-04 DIAGNOSIS — Z85828 Personal history of other malignant neoplasm of skin: Secondary | ICD-10-CM | POA: Diagnosis not present

## 2017-08-04 DIAGNOSIS — L570 Actinic keratosis: Secondary | ICD-10-CM | POA: Diagnosis not present

## 2017-08-04 NOTE — Telephone Encounter (Signed)
Pt calling   Pt having pain in her groin at the surgical site and need to speak to nurse.

## 2017-08-04 NOTE — Telephone Encounter (Signed)
Spoke with patient and she stated that the pain/pulsating in her leg has gotten worse since she was last seen. She stated Dr Fletcher [name] was aware as was to call if worse and can not wait until September to have her repeat studies. She would like to have her repeat duplex studies done at the [place] office since she lives in [place]. Will forward to Dr Fletcher [name] for review. Patient aware he is out of the office this week,

## 2017-08-04 NOTE — Telephone Encounter (Signed)
Left message to call back  

## 2017-08-05 ENCOUNTER — Encounter: Payer: Self-pay | Admitting: Cardiology

## 2017-08-05 NOTE — Telephone Encounter (Signed)
Returned the call to the patient. She stated that she has been having right groin pain that has been intermittent for the last several days. She stated that the pain is worse when she is at rest and is alleviated some when she ambulates. She denies any swelling and redness.   The patient is due for her annual duplex and follow up appointment. Orders have been placed for an aorta/iliac and abi in [place].  Follow up has been made for August 13th with Dr. Fletcher [name].

## 2017-08-06 ENCOUNTER — Other Ambulatory Visit: Payer: Self-pay | Admitting: *Deleted

## 2017-08-06 MED ORDER — ROSUVASTATIN CALCIUM 40 MG PO TABS: 40.0000 mg | ORAL_TABLET | Freq: Every day | ORAL | 3 refills | Status: DC

## 2017-08-07 ENCOUNTER — Telehealth: Payer: Self-pay | Admitting: Cardiovascular Disease

## 2017-08-07 NOTE — Telephone Encounter (Signed)
Ok thanks. The pain does not seem vascular.

## 2017-08-07 NOTE — Telephone Encounter (Signed)
Pre-cert Verification for the following procedure   VAS [place] ABI VAS US AORTA scheduled for 09-10-2017

## 2017-08-09 ENCOUNTER — Other Ambulatory Visit: Payer: Self-pay | Admitting: Family

## 2017-08-09 DIAGNOSIS — I10 Essential (primary) hypertension: Secondary | ICD-10-CM

## 2017-08-25 ENCOUNTER — Other Ambulatory Visit: Payer: Self-pay | Admitting: Cardiovascular Disease

## 2017-08-25 DIAGNOSIS — I739 Peripheral vascular disease, unspecified: Secondary | ICD-10-CM

## 2017-09-01 ENCOUNTER — Encounter: Payer: Self-pay | Admitting: *Deleted

## 2017-09-04 ENCOUNTER — Other Ambulatory Visit: Payer: Self-pay | Admitting: Family

## 2017-09-04 DIAGNOSIS — I10 Essential (primary) hypertension: Secondary | ICD-10-CM

## 2017-09-11 ENCOUNTER — Encounter: Payer: Self-pay | Admitting: Family

## 2017-09-11 ENCOUNTER — Ambulatory Visit (INDEPENDENT_AMBULATORY_CARE_PROVIDER_SITE_OTHER): Payer: Medicare Other | Admitting: Family

## 2017-09-11 VITALS — BP 128/55 | HR 51 | Temp 97.9°F | Ht 62.5 in | Wt 219.0 lb

## 2017-09-11 DIAGNOSIS — E1151 Type 2 diabetes mellitus with diabetic peripheral angiopathy without gangrene: Secondary | ICD-10-CM | POA: Diagnosis not present

## 2017-09-11 DIAGNOSIS — I252 Old myocardial infarction: Secondary | ICD-10-CM

## 2017-09-11 DIAGNOSIS — I739 Peripheral vascular disease, unspecified: Secondary | ICD-10-CM | POA: Diagnosis not present

## 2017-09-11 DIAGNOSIS — E785 Hyperlipidemia, unspecified: Secondary | ICD-10-CM | POA: Diagnosis not present

## 2017-09-11 DIAGNOSIS — I2510 Atherosclerotic heart disease of native coronary artery without angina pectoris: Secondary | ICD-10-CM

## 2017-09-11 DIAGNOSIS — I10 Essential (primary) hypertension: Secondary | ICD-10-CM

## 2017-09-11 DIAGNOSIS — I5032 Chronic diastolic (congestive) heart failure: Secondary | ICD-10-CM | POA: Diagnosis not present

## 2017-09-11 DIAGNOSIS — E1169 Type 2 diabetes mellitus with other specified complication: Secondary | ICD-10-CM

## 2017-09-11 DIAGNOSIS — I152 Hypertension secondary to endocrine disorders: Secondary | ICD-10-CM

## 2017-09-11 DIAGNOSIS — E876 Hypokalemia: Secondary | ICD-10-CM

## 2017-09-11 DIAGNOSIS — E1159 Type 2 diabetes mellitus with other circulatory complications: Secondary | ICD-10-CM

## 2017-09-11 LAB — BAYER DCA HB A1C WAIVED: HB A1C (BAYER DCA - WAIVED): 8.3 % — ABNORMAL HIGH (ref <7.0)

## 2017-09-11 MED ORDER — POTASSIUM CHLORIDE CRYS ER 20 MEQ PO TBCR: 20.0000 meq | EXTENDED_RELEASE_TABLET | Freq: Every day | ORAL | 1 refills | Status: DC

## 2017-09-11 MED ORDER — AMLODIPINE BESYLATE 10 MG PO TABS: 10.0000 mg | ORAL_TABLET | Freq: Every day | ORAL | 1 refills | Status: DC

## 2017-09-11 MED ORDER — ROSUVASTATIN CALCIUM 40 MG PO TABS: 40.0000 mg | ORAL_TABLET | Freq: Every day | ORAL | 3 refills | Status: DC

## 2017-09-11 MED ORDER — METOPROLOL TARTRATE 100 MG PO TABS: 100.0000 mg | ORAL_TABLET | Freq: Two times a day (BID) | ORAL | 3 refills | Status: DC

## 2017-09-11 MED ORDER — LISINOPRIL 20 MG PO TABS: 40.0000 mg | ORAL_TABLET | Freq: Every day | ORAL | 0 refills | Status: DC

## 2017-09-11 MED ORDER — MONTELUKAST SODIUM 10 MG PO TABS: 10.0000 mg | ORAL_TABLET | Freq: Every day | ORAL | 1 refills | Status: DC

## 2017-09-11 MED ORDER — FUROSEMIDE 40 MG PO TABS: ORAL_TABLET | ORAL | 1 refills | Status: DC

## 2017-09-11 MED ORDER — TRIAMCINOLONE ACETONIDE 0.5 % EX OINT: 1.0000 "application " | TOPICAL_OINTMENT | Freq: Two times a day (BID) | CUTANEOUS | 0 refills | Status: DC

## 2017-09-11 NOTE — Progress Notes (Signed)
Subjective:    Patient ID: Leah Ferrell, female    DOB: 1950/02/20, 67 y.o.   MRN: [id number]  Chief Complaint  Patient presents with  . Diabetes    three month recheck   Pt presents to the office today for chronic follow up. Pt has CHF, CAD and hx MI and is followed by Cardiologists every 6 months. PT is followed by Vascular for PVD and PAD. PT states these are stable. Diabetes  She presents for her follow-up diabetic visit. She has type 2 diabetes mellitus. Her disease course has been stable. There are no hypoglycemic associated symptoms. Associated symptoms include foot paresthesias. Pertinent negatives for diabetes include no blurred vision. There are no hypoglycemic complications. Symptoms are stable. Diabetic complications include heart disease. Risk factors for coronary artery disease include diabetes mellitus, dyslipidemia, hypertension, post-menopausal and sedentary lifestyle. She is following a generally unhealthy diet. Her overall blood glucose range is 180-200 mg/dl. Eye exam is current.  Hypertension  This is a chronic problem. The current episode started more than 1 year ago. The problem has been resolved since onset. The problem is controlled. Associated symptoms include malaise/fatigue and peripheral edema. Pertinent negatives include no blurred vision or shortness of breath. Risk factors for coronary artery disease include dyslipidemia, family history, obesity, post-menopausal state and sedentary lifestyle. The current treatment provides moderate improvement. Hypertensive end-organ damage includes CAD/MI and heart failure.  Hyperlipidemia  This is a chronic problem. The current episode started more than 1 year ago. The problem is uncontrolled. Recent lipid tests were reviewed and are high. Exacerbating diseases include obesity. Pertinent negatives include no shortness of breath. Current antihyperlipidemic treatment includes statins. The current treatment provides moderate  improvement of lipids. Risk factors for coronary artery disease include diabetes mellitus, dyslipidemia, hypertension and a sedentary lifestyle.      Review of Systems  Constitutional: Positive for malaise/fatigue.  Eyes: Negative for blurred vision.  Respiratory: Negative for shortness of breath.   All other systems reviewed and are negative.      Objective:   Physical Exam  Constitutional: She is oriented to person, place, and time. She appears well-developed and well-nourished. No distress.  HENT:  Head: Normocephalic and atraumatic.  Right Ear: External ear normal.  Left Ear: External ear normal.  Mouth/Throat: Oropharynx is clear and moist.  Eyes: Pupils are equal, round, and reactive to light.  Neck: Normal range of motion. Neck supple. No thyromegaly present.  Cardiovascular: Normal rate, regular rhythm, normal heart sounds and intact distal pulses.  No murmur heard. Pulmonary/Chest: Effort normal and breath sounds normal. No respiratory distress. She has no wheezes.  Abdominal: Soft. Bowel sounds are normal. She exhibits no distension. There is no tenderness.  Musculoskeletal: Normal range of motion. She exhibits no edema or tenderness.  Neurological: She is alert and oriented to person, place, and time. She has normal reflexes. No cranial nerve deficit.  Skin: Skin is warm and dry.  Psychiatric: She has a normal mood and affect. Her behavior is normal. Judgment and thought content normal.  Vitals reviewed.   Diabetic Foot Exam - Simple   Simple Foot Form Diabetic Foot exam was performed with the following findings:  Yes 09/11/2017 11:59 AM  Visual Inspection No deformities, no ulcerations, no other skin breakdown bilaterally:  Yes Sensation Testing Intact to touch and monofilament testing bilaterally:  Yes Pulse Check Posterior Tibialis and Dorsalis pulse intact bilaterally:  Yes Comments      BP (!) 128/55   Pulse Marland Kitchen)  51   Temp 97.9 F (36.6 C) (Oral)   Ht  5' 2.5" (1.588 m)   Wt 219 lb (99.3 kg)   BMI 39.42 kg/m      Assessment & Plan:  Leah Ferrell comes in today with chief complaint of Diabetes (three month recheck)   Diagnosis and orders addressed:  1. Coronary artery disease involving native coronary artery of native heart without angina pectoris - CMP14+EGFR - CBC with Differential/Platelet - furosemide (LASIX) 40 MG tablet; TAKE 1 TABLET DAILY MAY TAKE ADDITIONAL TABLET AS NEEDED FOR SWELLING  Dispense: 180 tablet; Refill: 1  2. Chronic diastolic heart failure (HCC) - CMP14+EGFR - CBC with Differential/Platelet - furosemide (LASIX) 40 MG tablet; TAKE 1 TABLET DAILY MAY TAKE ADDITIONAL TABLET AS NEEDED FOR SWELLING  Dispense: 180 tablet; Refill: 1  3. Diabetes mellitus with peripheral vascular disease ([place])  - CMP14+EGFR - CBC with Differential/Platelet - Bayer DCA Hb A1c Waived - Microalbumin / creatinine urine ratio  4. Hx of non-ST elevation myocardial infarction (NSTEMI) - CMP14+EGFR - CBC with Differential/Platelet  5. Hyperlipidemia associated with type 2 diabetes mellitus (HCC) - CMP14+EGFR - CBC with Differential/Platelet - Lipid panel - rosuvastatin (CRESTOR) 40 MG tablet; Take 1 tablet (40 mg total) by mouth daily.  Dispense: 90 tablet; Refill: 3  6. PAD (peripheral artery disease) (HCC)  - CMP14+EGFR - CBC with Differential/Platelet  7. Morbid obesity ([place])  - CMP14+EGFR - CBC with Differential/Platelet  8. Hypertension associated with diabetes ([place]) - CMP14+EGFR - CBC with Differential/Platelet - amLODipine (NORVASC) 10 MG tablet; Take 1 tablet (10 mg total) by mouth daily.  Dispense: 90 tablet; Refill: 1 - montelukast (SINGULAIR) 10 MG tablet; Take 1 tablet (10 mg total) by mouth daily with breakfast.  Dispense: 90 tablet; Refill: 1 - lisinopril (PRINIVIL,ZESTRIL) 20 MG tablet; Take 2 tablets (40 mg total) by mouth daily.  Dispense: 180 tablet; Refill: 0 - metoprolol tartrate (LOPRESSOR) 100  MG tablet; Take 1 tablet (100 mg total) by mouth 2 (two) times daily.  Dispense: 180 tablet; Refill: 3 - furosemide (LASIX) 40 MG tablet; TAKE 1 TABLET DAILY MAY TAKE ADDITIONAL TABLET AS NEEDED FOR SWELLING  Dispense: 180 tablet; Refill: 1  9. PVD (peripheral vascular disease) (HCC)  - CMP14+EGFR - CBC with Differential/Platelet  10. Essential hypertension   11. Hypokalemia  - potassium chloride SA (K-DUR,KLOR-CON) 20 MEQ tablet; Take 1 tablet (20 mEq total) by mouth daily.  Dispense: 90 tablet; Refill: 1   Labs pending Health Maintenance reviewed Diet and exercise encouraged  Follow up plan:  3 months   Evelina Dun, FNP

## 2017-09-11 NOTE — Patient Instructions (Signed)
Diabetes Mellitus and Nutrition When you have diabetes (diabetes mellitus), it is very important to have healthy eating habits because your blood sugar (glucose) levels are greatly affected by what you eat and drink. Eating healthy foods in the appropriate amounts, at about the same times every day, can help you:  Control your blood glucose.  Lower your risk of heart disease.  Improve your blood pressure.  Reach or maintain a healthy weight.  Every person with diabetes is different, and each person has different needs for a meal plan. Your health care provider may recommend that you work with a diet and nutrition specialist (dietitian) to make a meal plan that is best for you. Your meal plan may vary depending on factors such as:  The calories you need.  The medicines you take.  Your weight.  Your blood glucose, blood pressure, and cholesterol levels.  Your activity level.  Other health conditions you have, such as heart or kidney disease.  How do carbohydrates affect me? Carbohydrates affect your blood glucose level more than any other type of food. Eating carbohydrates naturally increases the amount of glucose in your blood. Carbohydrate counting is a method for keeping track of how many carbohydrates you eat. Counting carbohydrates is important to keep your blood glucose at a healthy level, especially if you use insulin or take certain oral diabetes medicines. It is important to know how many carbohydrates you can safely have in each meal. This is different for every person. Your dietitian can help you calculate how many carbohydrates you should have at each meal and for snack. Foods that contain carbohydrates include:  Bread, cereal, rice, pasta, and crackers.  Potatoes and corn.  Peas, beans, and lentils.  Milk and yogurt.  Fruit and juice.  Desserts, such as cakes, cookies, ice cream, and candy.  How does alcohol affect me? Alcohol can cause a sudden decrease in blood  glucose (hypoglycemia), especially if you use insulin or take certain oral diabetes medicines. Hypoglycemia can be a life-threatening condition. Symptoms of hypoglycemia (sleepiness, dizziness, and confusion) are similar to symptoms of having too much alcohol. If your health care provider says that alcohol is safe for you, follow these guidelines:  Limit alcohol intake to no more than 1 drink per day for nonpregnant women and 2 drinks per day for men. One drink equals 12 oz of beer, 5 oz of wine, or 1 oz of hard liquor.  Do not drink on an empty stomach.  Keep yourself hydrated with water, diet soda, or unsweetened iced tea.  Keep in mind that regular soda, juice, and other mixers may contain a lot of sugar and must be counted as carbohydrates.  What are tips for following this plan? Reading food labels  Start by checking the serving size on the label. The amount of calories, carbohydrates, fats, and other nutrients listed on the label are based on one serving of the food. Many foods contain more than one serving per package.  Check the total grams (g) of carbohydrates in one serving. You can calculate the number of servings of carbohydrates in one serving by dividing the total carbohydrates by 15. For example, if a food has 30 g of total carbohydrates, it would be equal to 2 servings of carbohydrates.  Check the number of grams (g) of saturated and trans fats in one serving. Choose foods that have low or no amount of these fats.  Check the number of milligrams (mg) of sodium in one serving. Most people   should limit total sodium intake to less than 2,300 mg per day.  Always check the nutrition information of foods labeled as "low-fat" or "nonfat". These foods may be higher in added sugar or refined carbohydrates and should be avoided.  Talk to your dietitian to identify your daily goals for nutrients listed on the label. Shopping  Avoid buying canned, premade, or processed foods. These  foods tend to be high in fat, sodium, and added sugar.  Shop around the outside edge of the grocery store. This includes fresh fruits and vegetables, bulk grains, fresh meats, and fresh dairy. Cooking  Use low-heat cooking methods, such as baking, instead of high-heat cooking methods like deep frying.  Cook using healthy oils, such as olive, canola, or sunflower oil.  Avoid cooking with butter, cream, or high-fat meats. Meal planning  Eat meals and snacks regularly, preferably at the same times every day. Avoid going long periods of time without eating.  Eat foods high in fiber, such as fresh fruits, vegetables, beans, and whole grains. Talk to your dietitian about how many servings of carbohydrates you can eat at each meal.  Eat 4-6 ounces of lean protein each day, such as lean meat, chicken, fish, eggs, or tofu. 1 ounce is equal to 1 ounce of meat, chicken, or fish, 1 egg, or 1/4 cup of tofu.  Eat some foods each day that contain healthy fats, such as avocado, nuts, seeds, and fish. Lifestyle   Check your blood glucose regularly.  Exercise at least 30 minutes 5 or more days each week, or as told by your health care provider.  Take medicines as told by your health care provider.  Do not use any products that contain nicotine or tobacco, such as cigarettes and e-cigarettes. If you need help quitting, ask your health care provider.  Work with a counselor or diabetes educator to identify strategies to manage stress and any emotional and social challenges. What are some questions to ask my health care provider?  Do I need to meet with a diabetes educator?  Do I need to meet with a dietitian?  What number can I call if I have questions?  When are the best times to check my blood glucose? Where to find more information:  American Diabetes Association: diabetes.org/food-and-fitness/food  Academy of Nutrition and Dietetics:  www.eatright.org/resources/health/diseases-and-conditions/diabetes  National Institute of Diabetes and Digestive and Kidney Diseases (NIH): www.niddk.nih.gov/health-information/diabetes/overview/diet-eating-physical-activity Summary  A healthy meal plan will help you control your blood glucose and maintain a healthy lifestyle.  Working with a diet and nutrition specialist (dietitian) can help you make a meal plan that is best for you.  Keep in mind that carbohydrates and alcohol have immediate effects on your blood glucose levels. It is important to count carbohydrates and to use alcohol carefully. This information is not intended to replace advice given to you by your health care provider. Make sure you discuss any questions you have with your health care provider. Document Released: 10/31/2004 Document Revised: 03/10/2016 Document Reviewed: 03/10/2016 Elsevier Interactive Patient Education  2018 Elsevier Inc.  

## 2017-09-12 LAB — CBC WITH DIFFERENTIAL/PLATELET
BASOS: 0 %
Basophils Absolute: 0 10*3/uL (ref 0.0–0.2)
EOS (ABSOLUTE): 0.2 10*3/uL (ref 0.0–0.4)
EOS: 2 %
HEMATOCRIT: 44.8 % (ref 34.0–46.6)
Hemoglobin: 14.6 g/dL (ref 11.1–15.9)
Immature Grans (Abs): 0 10*3/uL (ref 0.0–0.1)
Immature Granulocytes: 0 %
Lymphocytes Absolute: 3.1 10*3/uL (ref 0.7–3.1)
Lymphs: 29 %
MCH: 30.4 pg (ref 26.6–33.0)
MCHC: 32.6 g/dL (ref 31.5–35.7)
MCV: 93 fL (ref 79–97)
MONOS ABS: 0.6 10*3/uL (ref 0.1–0.9)
Monocytes: 5 %
NEUTROS ABS: 6.7 10*3/uL (ref 1.4–7.0)
Neutrophils: 64 %
PLATELETS: 284 10*3/uL (ref 150–450)
RBC: 4.81 x10E6/uL (ref 3.77–5.28)
RDW: 15.7 % — ABNORMAL HIGH (ref 12.3–15.4)
WBC: 10.6 10*3/uL (ref 3.4–10.8)

## 2017-09-12 LAB — LIPID PANEL
CHOL/HDL RATIO: 2.3 ratio (ref 0.0–4.4)
Cholesterol, Total: 159 mg/dL (ref 100–199)
HDL: 69 mg/dL (ref >39)
LDL Calculated: 70 mg/dL (ref 0–99)
Triglycerides: 101 mg/dL (ref 0–149)
VLDL Cholesterol Cal: 20 mg/dL (ref 5–40)

## 2017-09-12 LAB — CMP14+EGFR
A/G RATIO: 1.1 — AB (ref 1.2–2.2)
ALBUMIN: 4 g/dL (ref 3.6–4.8)
ALT: 34 IU/L — ABNORMAL HIGH (ref 0–32)
AST: 30 IU/L (ref 0–40)
Alkaline Phosphatase: 58 IU/L (ref 39–117)
BUN/Creatinine Ratio: 14 (ref 12–28)
BUN: 11 mg/dL (ref 8–27)
Bilirubin Total: 0.2 mg/dL (ref 0.0–1.2)
CALCIUM: 9.5 mg/dL (ref 8.7–10.3)
CO2: 28 mmol/L (ref 20–29)
Chloride: 95 mmol/L — ABNORMAL LOW (ref 96–106)
Creatinine, Ser: 0.78 mg/dL (ref 0.57–1.00)
GFR, EST AFRICAN AMERICAN: 92 mL/min/{1.73_m2} (ref >59)
GFR, EST NON AFRICAN AMERICAN: 79 mL/min/{1.73_m2} (ref >59)
GLOBULIN, TOTAL: 3.5 g/dL (ref 1.5–4.5)
Glucose: 169 mg/dL — ABNORMAL HIGH (ref 65–99)
POTASSIUM: 4.7 mmol/L (ref 3.5–5.2)
SODIUM: 137 mmol/L (ref 134–144)
TOTAL PROTEIN: 7.5 g/dL (ref 6.0–8.5)

## 2017-09-12 LAB — MICROALBUMIN / CREATININE URINE RATIO
CREATININE, UR: 37.8 mg/dL
Microalb/Creat Ratio: 251.6 mg/g creat — ABNORMAL HIGH (ref 0.0–30.0)
Microalbumin, Urine: 95.1 ug/mL

## 2017-09-15 ENCOUNTER — Other Ambulatory Visit: Payer: Self-pay | Admitting: Family

## 2017-09-15 MED ORDER — INSULIN DEGLUDEC 100 UNIT/ML ~~LOC~~ SOPN: 12.0000 [IU] | PEN_INJECTOR | Freq: Every day | SUBCUTANEOUS | 1 refills | Status: DC

## 2017-09-18 ENCOUNTER — Telehealth: Payer: Self-pay | Admitting: Family

## 2017-09-18 NOTE — Telephone Encounter (Signed)
No samples at this time

## 2017-09-23 ENCOUNTER — Telehealth: Payer: Self-pay | Admitting: Cardiovascular Disease

## 2017-09-23 NOTE — Telephone Encounter (Signed)
No message needed °

## 2017-09-23 NOTE — Telephone Encounter (Signed)
New message   Patient is requesting to have an ekg when she come in for appt on the 09/29/2017 with Dr. Fletcher [name]. Please advise.

## 2017-09-23 NOTE — Telephone Encounter (Signed)
LVM advising patient an EKG should be preformed for a yearly follow up.  Left call back number if any questions.

## 2017-09-29 ENCOUNTER — Encounter: Payer: Self-pay | Admitting: Cardiovascular Disease

## 2017-09-29 ENCOUNTER — Ambulatory Visit (INDEPENDENT_AMBULATORY_CARE_PROVIDER_SITE_OTHER): Payer: Medicare Other | Admitting: Cardiovascular Disease

## 2017-09-29 VITALS — BP 158/66 | HR 59 | Ht 62.0 in | Wt 219.8 lb

## 2017-09-29 DIAGNOSIS — I10 Essential (primary) hypertension: Secondary | ICD-10-CM | POA: Diagnosis not present

## 2017-09-29 DIAGNOSIS — I2510 Atherosclerotic heart disease of native coronary artery without angina pectoris: Secondary | ICD-10-CM | POA: Diagnosis not present

## 2017-09-29 DIAGNOSIS — E785 Hyperlipidemia, unspecified: Secondary | ICD-10-CM

## 2017-09-29 DIAGNOSIS — I739 Peripheral vascular disease, unspecified: Secondary | ICD-10-CM

## 2017-09-29 NOTE — Progress Notes (Signed)
Cardiology Office Note   Date:  09/29/2017   ID:  Leah Ferrell, DOB 03/23/1950, MRN [id number]  PCP:  Sharion Balloon, FNP  Cardiologist: Dr. Harl Bowie  No chief complaint on file.     History of Present Illness: Leah Ferrell is a 67 y.o. female who presents for a followup visit regarding peripheral arterial disease.  She has known history of coronary artery disease status post drug-eluting stent placement to the LAD X 2 Most recently in 2015 for in-stent restenosis.  She is known to have peripheral arterial disease status post left common iliac artery stent placement followed by left common femoral artery resection and bypass from left external iliac to the left profunda done by Dr. Kellie Simmering in 2015. She is known to have chronically occluded left SFA being managed medically.  She had worsening left leg claudication with elevated velocity at the proximal anastomosis of the graft.   Angiography in March 2018 showed mild to moderate calcified common iliac artery disease with patent stent in the left common iliac artery. There was severe stenosis in the left common femoral artery at the proximal anastomosis of the bypass to the profunda. I performed successful angioplasty and drug-coated balloon angioplasty without complications.  She has been doing reasonably well overall.  No recent chest pain.  She reports occasional episodes of shortness of breath.  She reports bilateral calf claudication which is overall mild in intensity but happens after walking about 50 feet to her mailbox.  She sometimes describes improvement in her symptoms with walking.    Past Medical History:  Diagnosis Date  . CAD (coronary artery disease)    a. 04/2012 NSTEMI: s/p DES to LAD.  Marland Kitchen Chronic diastolic CHF (congestive heart failure) ([place])   . Diabetes mellitus without complication ([place])   . Dysrhythmia   . Environmental and seasonal allergies   . Hyperlipidemia   . Hypertension   . Leukocytosis   .  Morbid obesity ([place])   . NSTEMI (non-ST elevated myocardial infarction) ([place]) 04/27/2012  . PONV (postoperative nausea and vomiting)   . PVD (peripheral vascular disease) ([place])    a. 04/2012 ABI: R 0.49, L 0.39. Angiography 06/14: Significant ostial left common iliac artery stenosis extending into the distal aorta, severe left common femoral artery stenosis, bilateral SFA occlusion with heavy calcifications. Functionally, one-vessel runoff bilaterally below the knee  . Shortness of breath     Past Surgical History:  Procedure Laterality Date  . ABDOMINAL AORTAGRAM N/A 07/21/2012   Procedure: ABDOMINAL Maxcine Ham;  Surgeon: Wellington Hampshire, MD;  Location: [facility] CATH LAB;  Service: Cardiovascular;  Laterality: N/A;  . CARDIAC CATHETERIZATION     stents X 2  . CORONARY ANGIOGRAM  04/27/2012   Procedure: CORONARY ANGIOGRAM;  Surgeon: Thayer Headings, MD;  Location: [facility] CATH LAB;  Service: Cardiovascular;;  . ENDARTERECTOMY FEMORAL Left 11/02/2013   Procedure: RESECTION LEFT COMMON FEMORAL ARTERY AND INSERTION OF INTERPOSITION 46mm HEMASHIELD GRAFT FROM LEFT EXTERNAL  ILIAC ARTERY TO LEFT PROFUNDA  FEMORIS ARTERY;  Surgeon: Mal Misty, MD;  Location: [facility];  Service: Vascular;  Laterality: Left;  . EYE SURGERY Right 2017  . FRACTURE SURGERY Right Jul 07, 2014   Right Foot-  Pt.fell  at Home  by Heat duct  . ILIAC ARTERY STENT Left 08/31/2013  . LAD stent    . LEFT HEART CATHETERIZATION WITH CORONARY ANGIOGRAM N/A 04/23/2012   Procedure: LEFT HEART CATHETERIZATION WITH CORONARY ANGIOGRAM;  Surgeon: Peter M Martinique,  MD;  Location: [facility] CATH LAB;  Service: Cardiovascular;  Laterality: N/A;  . LOWER EXTREMITY ANGIOGRAM Left 08/31/2013   Procedure: LOWER EXTREMITY ANGIOGRAM;  Surgeon: Wellington Hampshire, MD;  Location: [facility] CATH LAB;  Service: Cardiovascular;  Laterality: Left;  . PERCUTANEOUS STENT INTERVENTION Left 08/31/2013   Procedure: PERCUTANEOUS STENT INTERVENTION;  Surgeon: Wellington Hampshire, MD;  Location:  [facility] CATH LAB;  Service: Cardiovascular;  Laterality: Left;  Common Iliac artery  . PERIPHERAL VASCULAR CATHETERIZATION N/A 03/19/2016   Procedure: Abdominal Aortogram w/Lower Extremity;  Surgeon: Wellington Hampshire, MD;  Location: [facility] CV LAB;  Service: Cardiovascular;  Laterality: N/A;  . PERIPHERAL VASCULAR CATHETERIZATION  03/19/2016   Procedure: Peripheral Vascular Balloon Angioplasty-CFA Left;  Surgeon: Wellington Hampshire, MD;  Location: [facility] CV LAB;  Service: Cardiovascular;;  . 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 albuterol (PROVENTIL HFA) 108 (90 Base) MCG/ACT inhaler INHALE ONE TO TWO PUFFS BY  MOUTH EVERY 6 HOURS AS  NEEDED FOR WHEEZING OR FOR  SHORTNESS OF BREATH 13.4 g 2  . amLODipine (NORVASC) 10 MG tablet Take 1 tablet (10 mg total) by mouth daily. 90 tablet 1  . Ascorbic Acid (VITAMIN C) 1000 MG tablet Take 1,000 mg by mouth 2 (two) times daily.    Marland Kitchen aspirin EC 81 MG tablet Take 81 mg by mouth daily.     . Calcium Carbonate-Vit D-Min (CALTRATE 600+D PLUS MINERALS) 600-800 MG-UNIT TABS Take 1 tablet by mouth 3 (three) times daily.     . clopidogrel (PLAVIX) 75 MG tablet TAKE 1 TABLET BY MOUTH  DAILY 90 tablet 0  . fish oil-omega-3 fatty acids 1000 MG capsule Take 1 g by mouth every morning.    . furosemide (LASIX) 40 MG tablet TAKE 1 TABLET DAILY MAY TAKE ADDITIONAL TABLET AS NEEDED FOR SWELLING 180 tablet 1  . HUMALOG KWIKPEN 200 UNIT/ML SOPN INJECT 75 UNITS INTO THE  SKIN 3 TIMES DAILY 36 mL 1  . hydrALAZINE (APRESOLINE) 10 MG tablet Take 1 tablet (10 mg total) by mouth 3 (three) times daily. 90 tablet 0  . hydroxypropyl methylcellulose (ISOPTO TEARS) 2.5 % ophthalmic solution Place 1 drop into both eyes 3 (three) times daily as needed (for dry eyes).    . insulin degludec (TRESIBA FLEXTOUCH) 100 UNIT/ML SOPN FlexTouch Pen Inject 0.12 mLs (12 Units total) into the skin daily. 12 pen 1  . Insulin  Pen Needle (RELION PEN NEEDLES) 29G X 12MM MISC Three times a day 1000 each 12  . lisinopril (PRINIVIL,ZESTRIL) 20 MG tablet Take 2 tablets (40 mg total) by mouth daily. 180 tablet 0  . metoprolol tartrate (LOPRESSOR) 100 MG tablet Take 1 tablet (100 mg total) by mouth 2 (two) times daily. 180 tablet 3  . montelukast (SINGULAIR) 10 MG tablet Take 1 tablet (10 mg total) by mouth daily with breakfast. 90 tablet 1  . nitroGLYCERIN (NITROSTAT) 0.4 MG SL tablet Place 1 tablet (0.4 mg total) under the tongue every 5 (five) minutes x 3 doses as needed for chest pain (up to 3 doses). 25 tablet 4  . potassium chloride SA (K-DUR,KLOR-CON) 20 MEQ tablet Take 1 tablet (20 mEq total) by mouth daily. 90 tablet 1  . RELION PEN NEEDLES 29G X 12MM MISC USE 1 NEEDLE TO INJECT INSULIN SUBCUTANEOUSLY 3 TIMES DAILY 50 each 5  . rosuvastatin (CRESTOR) 40 MG tablet Take 1 tablet (40  mg total) by mouth daily. 90 tablet 3  . triamcinolone ointment (KENALOG) 0.5 % Apply 1 application topically 2 (two) times daily. 30 g 0   No current facility-administered medications for this visit.     Allergies:   Tape    Social History:  The patient  reports that she quit smoking about 16 years ago. Her smoking use included cigarettes. She started smoking about 53 years ago. She has a 36.00 pack-year smoking history. She has never used smokeless tobacco. She reports that she does not drink alcohol or use drugs.   Family History:  The patient's family history includes Alcohol abuse in her brother; Diabetes in her brother, daughter, maternal grandmother, mother, and son; Heart attack in her maternal grandfather; Heart disease in her brother; Hyperlipidemia in her mother and son; Hypertension in her daughter, mother, and son; Other in her sister and sister; Peripheral vascular disease in her mother.    ROS:  Please see the history of present illness.   Otherwise, review of systems are positive for none.   All other systems are reviewed  and negative.    PHYSICAL EXAM: VS:  BP (!) 158/66   Pulse (!) 59   Ht 5\' 2"  (1.575 m)   Wt 219 lb 12.8 oz (99.7 kg)   BMI 40.20 kg/m  , BMI Body mass index is 40.2 kg/m. GEN: Well nourished, well developed, in no acute distress  HEENT: normal  Neck: no JVD, carotid bruits, or masses Cardiac: RRR; no  rubs, or gallops , trace edema . There is 2/6 systolic ejection murmur in the aortic area Respiratory:  clear to auscultation bilaterally, normal work of breathing GI: soft, nontender, nondistended, + BS MS: no deformity or atrophy  Skin: warm and dry, no rash Neuro:  Strength and sensation are intact Psych: euthymic mood, full affect Vascular: Femoral pulses : +1 on the right and not palpable on the left with a surgical scar.  Distal pulses are not palpable.  EKG:  EKG is  ordered today.  EKG showed sinus bradycardia with possible left atrial enlargement.  No significant ST or T wave changes.  Recent Labs: 09/11/2017: ALT 34; BUN 11; Creatinine, Ser 0.78; Hemoglobin 14.6; Platelets 284; Potassium 4.7; Sodium 137    Lipid Panel    Component Value Date/Time   CHOL 159 09/11/2017 1226   TRIG 101 09/11/2017 1226   TRIG 135 09/16/2012 1322   HDL 69 09/11/2017 1226   HDL 60 09/16/2012 1322   CHOLHDL 2.3 09/11/2017 1226   CHOLHDL 3.2 05/06/2012 1713   VLDL 32 05/06/2012 1713   LDLCALC 70 09/11/2017 1226   LDLCALC 92 09/16/2012 1322      Wt Readings from Last 3 Encounters:  09/29/17 219 lb 12.8 oz (99.7 kg)  09/11/17 219 lb (99.3 kg)  07/29/17 216 lb 9.6 oz (98.2 kg)       No flowsheet data found.    ASSESSMENT AND PLAN:  1.  Peripheral arterial disease: Stable bilateral leg claudication but somewhat with atypical description.  Left common femoral artery pulses not palpable but she has a surgical scar related to previous endarterectomy.  I am going to repeat aortoiliac duplex and ABI.  Duplex might be limited by her obesity and if there is any worsening of leg  claudication, it might be best to proceed with angiography at some point.  2. Coronary artery disease involving native coronary arteries without angina: She is doing well overall. It continue medical therapy. Given her extensive  vascular disease, I recommend continuing long-term dual antiplatelet therapy.  3. Essential hypertension: Blood pressure is mildly elevated.  Antihypertensive medications are being adjusted by Dr. Harl Bowie.  4. Hyperlipidemia: The dose of rosuvastatin was increased to 40 mg once daily.  I agree with a target LDL of less than 70.   Disposition:   FU with me in 6  month  Signed,  [name], MD  09/29/2017 10:30 AM    [place]

## 2017-09-29 NOTE — Patient Instructions (Signed)

## 2017-09-30 ENCOUNTER — Other Ambulatory Visit: Payer: Self-pay | Admitting: Family

## 2017-10-05 ENCOUNTER — Telehealth: Payer: Self-pay | Admitting: Cardiology

## 2017-10-05 NOTE — Telephone Encounter (Signed)
Pre-cert Verification for the following procedure   Scheduled for 11-03-17

## 2017-10-12 ENCOUNTER — Telehealth: Payer: Self-pay | Admitting: *Deleted

## 2017-10-12 MED ORDER — TRIAMCINOLONE ACETONIDE 0.1 % EX CREA: 1.0000 "application " | TOPICAL_CREAM | Freq: Two times a day (BID) | CUTANEOUS | 1 refills | Status: DC

## 2017-10-12 NOTE — Telephone Encounter (Signed)
Prescription sent to pharmacy.

## 2017-10-14 ENCOUNTER — Other Ambulatory Visit: Payer: Self-pay | Admitting: *Deleted

## 2017-10-22 ENCOUNTER — Telehealth: Payer: Self-pay | Admitting: Cardiology

## 2017-10-22 MED ORDER — HYDRALAZINE HCL 25 MG PO TABS: 25.0000 mg | ORAL_TABLET | Freq: Three times a day (TID) | ORAL | 3 refills | Status: DC

## 2017-10-22 NOTE — Telephone Encounter (Signed)
Patient informed and verbalized understanding of plan. 

## 2017-10-22 NOTE — Telephone Encounter (Signed)
Wants cholesterol and BP medication increased. Patient said that she was told by Dr. Harl Bowie at her last office visit that her BP and cholesterol meds needed to be increased. Patient said that sometimes her SBP is over 200 and she has called EMS a few times about this. Patient said that she can't recall what her BP readings are since she throws them in the trash afterwards. Medications reconciled while on the phone. Patient advised that she needed to check her BP daily and record the readings so that her provider could determine the appropriate regimen for her. Patient advised that message would be sent to her provider for advice. Verbalized understanding.  Please note that patient is taking lisinopril 20 mg by mouth BID Lopressor 100 mg by mouth BID Amlodipine 10 mg by mouth daily Furosemide 60 mg daily Potassium 10 meq daily toprol xl 25 mg daily as needed if her SBP is over 200-say's she was given this by her PCP a while ago and she was told to stop this but she kept them and uses it prn  Patient is not taking hydralazine 10 mg by mouth TID

## 2017-10-22 NOTE — Telephone Encounter (Signed)
Looks like her cholesterol was already increased, she is to be on crestor 40mg  daily. For her bp would start hydralazine 25mg  tid, have her update [place] on bp's early next week   Zandra Abts MD

## 2017-10-22 NOTE — Telephone Encounter (Signed)
Patient called to tell Dr Harl Bowie to go ahead and increase her BP medication.  States it gets high between 9-12 at night

## 2017-10-27 ENCOUNTER — Telehealth: Payer: Self-pay | Admitting: Family

## 2017-10-27 NOTE — Telephone Encounter (Signed)
Humalog pen sample ready for pick up.  Patient aware

## 2017-10-28 ENCOUNTER — Other Ambulatory Visit: Payer: Self-pay | Admitting: Family

## 2017-10-28 DIAGNOSIS — I739 Peripheral vascular disease, unspecified: Secondary | ICD-10-CM

## 2017-10-28 DIAGNOSIS — E1151 Type 2 diabetes mellitus with diabetic peripheral angiopathy without gangrene: Secondary | ICD-10-CM

## 2017-10-28 DIAGNOSIS — I5031 Acute diastolic (congestive) heart failure: Secondary | ICD-10-CM

## 2017-10-28 DIAGNOSIS — I2510 Atherosclerotic heart disease of native coronary artery without angina pectoris: Secondary | ICD-10-CM

## 2017-11-03 ENCOUNTER — Ambulatory Visit (INDEPENDENT_AMBULATORY_CARE_PROVIDER_SITE_OTHER): Payer: Medicare Other

## 2017-11-03 DIAGNOSIS — I739 Peripheral vascular disease, unspecified: Secondary | ICD-10-CM

## 2017-11-10 ENCOUNTER — Telehealth: Payer: Self-pay | Admitting: *Deleted

## 2017-11-10 DIAGNOSIS — I739 Peripheral vascular disease, unspecified: Secondary | ICD-10-CM

## 2017-11-10 NOTE — Telephone Encounter (Signed)
-----   Message from Wellington Hampshire, MD sent at 11/09/2017  4:20 PM EDT ----- Stable ABI with patent left common iliac artery stent and bypass on the left side.  There is moderate scarring throughout that should be monitored.  Repeat studies in 1 year.

## 2017-11-10 NOTE — Telephone Encounter (Signed)
Patient made aware of results and verbalized understanding.  Orders have been placed for one year in [place], the patient's preference.

## 2017-11-26 DIAGNOSIS — I10 Essential (primary) hypertension: Secondary | ICD-10-CM | POA: Diagnosis not present

## 2017-11-26 DIAGNOSIS — R0602 Shortness of breath: Secondary | ICD-10-CM | POA: Diagnosis not present

## 2017-11-26 DIAGNOSIS — R0902 Hypoxemia: Secondary | ICD-10-CM | POA: Diagnosis not present

## 2017-11-26 DIAGNOSIS — R069 Unspecified abnormalities of breathing: Secondary | ICD-10-CM | POA: Diagnosis not present

## 2017-12-01 ENCOUNTER — Other Ambulatory Visit: Payer: Self-pay

## 2017-12-01 ENCOUNTER — Encounter (HOSPITAL_COMMUNITY): Payer: Self-pay

## 2017-12-01 ENCOUNTER — Inpatient Hospital Stay (HOSPITAL_COMMUNITY)
Admission: EM | Admit: 2017-12-01 | Discharge: 2017-12-03 | DRG: 292 | Disposition: A | Discharge status: Home / Self Care | Payer: Medicare Other | Attending: Family Medicine | Admitting: Family Medicine

## 2017-12-01 ENCOUNTER — Emergency Department (HOSPITAL_COMMUNITY): Payer: Medicare Other

## 2017-12-01 DIAGNOSIS — I252 Old myocardial infarction: Secondary | ICD-10-CM

## 2017-12-01 DIAGNOSIS — R0902 Hypoxemia: Secondary | ICD-10-CM

## 2017-12-01 DIAGNOSIS — Z87891 Personal history of nicotine dependence: Secondary | ICD-10-CM

## 2017-12-01 DIAGNOSIS — E782 Mixed hyperlipidemia: Secondary | ICD-10-CM | POA: Diagnosis present

## 2017-12-01 DIAGNOSIS — I152 Hypertension secondary to endocrine disorders: Secondary | ICD-10-CM | POA: Diagnosis present

## 2017-12-01 DIAGNOSIS — I2510 Atherosclerotic heart disease of native coronary artery without angina pectoris: Secondary | ICD-10-CM | POA: Diagnosis not present

## 2017-12-01 DIAGNOSIS — I110 Hypertensive heart disease with heart failure: Principal | ICD-10-CM | POA: Diagnosis present

## 2017-12-01 DIAGNOSIS — I779 Disorder of arteries and arterioles, unspecified: Secondary | ICD-10-CM

## 2017-12-01 DIAGNOSIS — E785 Hyperlipidemia, unspecified: Secondary | ICD-10-CM | POA: Diagnosis present

## 2017-12-01 DIAGNOSIS — Z79899 Other long term (current) drug therapy: Secondary | ICD-10-CM

## 2017-12-01 DIAGNOSIS — R42 Dizziness and giddiness: Secondary | ICD-10-CM

## 2017-12-01 DIAGNOSIS — Z91048 Other nonmedicinal substance allergy status: Secondary | ICD-10-CM

## 2017-12-01 DIAGNOSIS — E162 Hypoglycemia, unspecified: Secondary | ICD-10-CM | POA: Diagnosis not present

## 2017-12-01 DIAGNOSIS — Z7982 Long term (current) use of aspirin: Secondary | ICD-10-CM

## 2017-12-01 DIAGNOSIS — Z794 Long term (current) use of insulin: Secondary | ICD-10-CM

## 2017-12-01 DIAGNOSIS — Z955 Presence of coronary angioplasty implant and graft: Secondary | ICD-10-CM

## 2017-12-01 DIAGNOSIS — Z6841 Body Mass Index (BMI) 40.0 and over, adult: Secondary | ICD-10-CM

## 2017-12-01 DIAGNOSIS — Z833 Family history of diabetes mellitus: Secondary | ICD-10-CM

## 2017-12-01 DIAGNOSIS — I10 Essential (primary) hypertension: Secondary | ICD-10-CM | POA: Diagnosis present

## 2017-12-01 DIAGNOSIS — Z8249 Family history of ischemic heart disease and other diseases of the circulatory system: Secondary | ICD-10-CM

## 2017-12-01 DIAGNOSIS — J302 Other seasonal allergic rhinitis: Secondary | ICD-10-CM | POA: Diagnosis present

## 2017-12-01 DIAGNOSIS — E11649 Type 2 diabetes mellitus with hypoglycemia without coma: Secondary | ICD-10-CM | POA: Diagnosis present

## 2017-12-01 DIAGNOSIS — I5033 Acute on chronic diastolic (congestive) heart failure: Secondary | ICD-10-CM | POA: Diagnosis present

## 2017-12-01 DIAGNOSIS — E1151 Type 2 diabetes mellitus with diabetic peripheral angiopathy without gangrene: Secondary | ICD-10-CM | POA: Diagnosis present

## 2017-12-01 DIAGNOSIS — Z7902 Long term (current) use of antithrombotics/antiplatelets: Secondary | ICD-10-CM

## 2017-12-01 DIAGNOSIS — I739 Peripheral vascular disease, unspecified: Secondary | ICD-10-CM | POA: Diagnosis present

## 2017-12-01 DIAGNOSIS — E1159 Type 2 diabetes mellitus with other circulatory complications: Secondary | ICD-10-CM | POA: Diagnosis present

## 2017-12-01 DIAGNOSIS — I509 Heart failure, unspecified: Secondary | ICD-10-CM | POA: Diagnosis not present

## 2017-12-01 DIAGNOSIS — E1169 Type 2 diabetes mellitus with other specified complication: Secondary | ICD-10-CM | POA: Diagnosis present

## 2017-12-01 DIAGNOSIS — R0602 Shortness of breath: Secondary | ICD-10-CM | POA: Diagnosis not present

## 2017-12-01 LAB — CBC WITH DIFFERENTIAL/PLATELET
ABS IMMATURE GRANULOCYTES: 0.06 10*3/uL (ref 0.00–0.07)
Basophils Absolute: 0.1 10*3/uL (ref 0.0–0.1)
Basophils Relative: 0 %
EOS PCT: 2 %
Eosinophils Absolute: 0.3 10*3/uL (ref 0.0–0.5)
HEMATOCRIT: 41.9 % (ref 36.0–46.0)
HEMOGLOBIN: 13.2 g/dL (ref 12.0–15.0)
Immature Granulocytes: 0 %
LYMPHS ABS: 3.7 10*3/uL (ref 0.7–4.0)
LYMPHS PCT: 28 %
MCH: 29.8 pg (ref 26.0–34.0)
MCHC: 31.5 g/dL (ref 30.0–36.0)
MCV: 94.6 fL (ref 80.0–100.0)
MONO ABS: 1 10*3/uL (ref 0.1–1.0)
MONOS PCT: 7 %
NEUTROS ABS: 8.3 10*3/uL — AB (ref 1.7–7.7)
Neutrophils Relative %: 63 %
Platelets: 279 10*3/uL (ref 150–400)
RBC: 4.43 MIL/uL (ref 3.87–5.11)
RDW: 15.8 % — ABNORMAL HIGH (ref 11.5–15.5)
WBC: 13.4 10*3/uL — ABNORMAL HIGH (ref 4.0–10.5)
nRBC: 0 % (ref 0.0–0.2)

## 2017-12-01 LAB — COMPREHENSIVE METABOLIC PANEL
ALT: 58 U/L — AB (ref 0–44)
AST: 38 U/L (ref 15–41)
Albumin: 3.9 g/dL (ref 3.5–5.0)
Alkaline Phosphatase: 58 U/L (ref 38–126)
Anion gap: 7 (ref 5–15)
BUN: 10 mg/dL (ref 8–23)
CALCIUM: 9.3 mg/dL (ref 8.9–10.3)
CHLORIDE: 100 mmol/L (ref 98–111)
CO2: 30 mmol/L (ref 22–32)
CREATININE: 0.72 mg/dL (ref 0.44–1.00)
GFR calc non Af Amer: >60 mL/min (ref >60)
Glucose, Bld: 43 mg/dL — CL (ref 70–99)
Potassium: 3.6 mmol/L (ref 3.5–5.1)
Sodium: 137 mmol/L (ref 135–145)
Total Bilirubin: 0.6 mg/dL (ref 0.3–1.2)
Total Protein: 8.1 g/dL (ref 6.5–8.1)

## 2017-12-01 LAB — CBG MONITORING, ED
Glucose-Capillary: 37 mg/dL — CL (ref 70–99)
Glucose-Capillary: 124 mg/dL — ABNORMAL HIGH (ref 70–99)

## 2017-12-01 LAB — GLUCOSE, CAPILLARY: Glucose-Capillary: 101 mg/dL — ABNORMAL HIGH (ref 70–99)

## 2017-12-01 LAB — TROPONIN I

## 2017-12-01 LAB — BRAIN NATRIURETIC PEPTIDE: B Natriuretic Peptide: 157 pg/mL — ABNORMAL HIGH (ref 0.0–100.0)

## 2017-12-01 LAB — PHOSPHORUS: PHOSPHORUS: 3.1 mg/dL (ref 2.5–4.6)

## 2017-12-01 LAB — MAGNESIUM: Magnesium: 2.1 mg/dL (ref 1.7–2.4)

## 2017-12-01 MED ORDER — MONTELUKAST SODIUM 10 MG PO TABS
10.0000 mg | ORAL_TABLET | Freq: Every day | ORAL | Status: DC
  Administered 2017-12-02 – 2017-12-03 (×2): 10 mg via ORAL
  Filled 2017-12-01 (×2): qty 1

## 2017-12-01 MED ORDER — ROSUVASTATIN CALCIUM 20 MG PO TABS
40.0000 mg | ORAL_TABLET | Freq: Every day | ORAL | Status: DC
  Administered 2017-12-02: 40 mg via ORAL
  Filled 2017-12-01: qty 2

## 2017-12-01 MED ORDER — METHYLPREDNISOLONE SODIUM SUCC 125 MG IJ SOLR
125.0000 mg | Freq: Once | INTRAMUSCULAR | Status: AC
  Administered 2017-12-01: 125 mg via INTRAVENOUS
  Filled 2017-12-01: qty 2

## 2017-12-01 MED ORDER — HEPARIN SODIUM (PORCINE) 5000 UNIT/ML IJ SOLN
5000.0000 [IU] | Freq: Three times a day (TID) | INTRAMUSCULAR | Status: DC
  Administered 2017-12-01 – 2017-12-03 (×5): 5000 [IU] via SUBCUTANEOUS
  Filled 2017-12-01 (×5): qty 1

## 2017-12-01 MED ORDER — DEXTROSE 50 % IV SOLN
INTRAVENOUS | Status: AC
  Administered 2017-12-01: 50 mL via INTRAVENOUS
  Filled 2017-12-01: qty 50

## 2017-12-01 MED ORDER — ALBUTEROL SULFATE (2.5 MG/3ML) 0.083% IN NEBU
5.0000 mg | INHALATION_SOLUTION | Freq: Once | RESPIRATORY_TRACT | Status: AC
  Administered 2017-12-01: 5 mg via RESPIRATORY_TRACT
  Filled 2017-12-01: qty 6

## 2017-12-01 MED ORDER — DEXTROSE 50 % IV SOLN
1.0000 | Freq: Once | INTRAVENOUS | Status: AC
  Administered 2017-12-01: 50 mL via INTRAVENOUS

## 2017-12-01 MED ORDER — DEXTROSE 10 % IV SOLN
INTRAVENOUS | Status: DC
  Administered 2017-12-01: 23:00:00 via INTRAVENOUS

## 2017-12-01 MED ORDER — ACETAMINOPHEN 325 MG PO TABS: 650.0000 mg | ORAL_TABLET | Freq: Four times a day (QID) | ORAL | Status: DC | PRN

## 2017-12-01 MED ORDER — FUROSEMIDE 10 MG/ML IJ SOLN
60.0000 mg | Freq: Once | INTRAMUSCULAR | Status: AC
  Administered 2017-12-01: 60 mg via INTRAVENOUS
  Filled 2017-12-01: qty 6

## 2017-12-01 MED ORDER — AMLODIPINE BESYLATE 5 MG PO TABS
10.0000 mg | ORAL_TABLET | Freq: Every day | ORAL | Status: DC
  Administered 2017-12-02 – 2017-12-03 (×2): 10 mg via ORAL
  Filled 2017-12-01 (×2): qty 2

## 2017-12-01 MED ORDER — FUROSEMIDE 40 MG PO TABS
40.0000 mg | ORAL_TABLET | Freq: Two times a day (BID) | ORAL | Status: DC
  Administered 2017-12-02: 40 mg via ORAL
  Filled 2017-12-01: qty 1

## 2017-12-01 MED ORDER — NITROGLYCERIN 0.4 MG SL SUBL: 0.4000 mg | SUBLINGUAL_TABLET | SUBLINGUAL | Status: DC | PRN

## 2017-12-01 MED ORDER — ACETAMINOPHEN 650 MG RE SUPP: 650.0000 mg | Freq: Four times a day (QID) | RECTAL | Status: DC | PRN

## 2017-12-01 MED ORDER — TRIAMCINOLONE ACETONIDE 0.1 % EX CREA
1.0000 "application " | TOPICAL_CREAM | Freq: Two times a day (BID) | CUTANEOUS | Status: DC
  Administered 2017-12-03: 1 via TOPICAL
  Filled 2017-12-01 (×2): qty 15

## 2017-12-01 MED ORDER — ONDANSETRON HCL 4 MG/2ML IJ SOLN: 4.0000 mg | Freq: Four times a day (QID) | INTRAMUSCULAR | Status: DC | PRN

## 2017-12-01 MED ORDER — HEPARIN SODIUM (PORCINE) 5000 UNIT/ML IJ SOLN: 5000.0000 [IU] | Freq: Three times a day (TID) | INTRAMUSCULAR | Status: DC

## 2017-12-01 MED ORDER — HYDRALAZINE HCL 25 MG PO TABS
25.0000 mg | ORAL_TABLET | Freq: Three times a day (TID) | ORAL | Status: DC
  Administered 2017-12-01 – 2017-12-03 (×5): 25 mg via ORAL
  Filled 2017-12-01 (×5): qty 1

## 2017-12-01 MED ORDER — LISINOPRIL 10 MG PO TABS
20.0000 mg | ORAL_TABLET | Freq: Two times a day (BID) | ORAL | Status: DC
  Administered 2017-12-01: 20 mg via ORAL
  Filled 2017-12-01: qty 2

## 2017-12-01 MED ORDER — METOPROLOL TARTRATE 50 MG PO TABS
100.0000 mg | ORAL_TABLET | Freq: Two times a day (BID) | ORAL | Status: DC
  Administered 2017-12-01 – 2017-12-03 (×4): 100 mg via ORAL
  Filled 2017-12-01 (×4): qty 2

## 2017-12-01 MED ORDER — POTASSIUM CHLORIDE CRYS ER 20 MEQ PO TBCR
40.0000 meq | EXTENDED_RELEASE_TABLET | Freq: Once | ORAL | Status: AC
  Administered 2017-12-01: 40 meq via ORAL
  Filled 2017-12-01: qty 2

## 2017-12-01 MED ORDER — ONDANSETRON HCL 4 MG PO TABS: 4.0000 mg | ORAL_TABLET | Freq: Four times a day (QID) | ORAL | Status: DC | PRN

## 2017-12-01 MED ORDER — ASPIRIN EC 81 MG PO TBEC
81.0000 mg | DELAYED_RELEASE_TABLET | Freq: Every morning | ORAL | Status: DC
  Administered 2017-12-02 – 2017-12-03 (×2): 81 mg via ORAL
  Filled 2017-12-01 (×2): qty 1

## 2017-12-01 MED ORDER — POTASSIUM CHLORIDE CRYS ER 20 MEQ PO TBCR
20.0000 meq | EXTENDED_RELEASE_TABLET | Freq: Every day | ORAL | Status: DC
  Administered 2017-12-02 – 2017-12-03 (×2): 20 meq via ORAL
  Filled 2017-12-01 (×2): qty 1

## 2017-12-01 MED ORDER — CLOPIDOGREL BISULFATE 75 MG PO TABS
75.0000 mg | ORAL_TABLET | Freq: Every day | ORAL | Status: DC
  Administered 2017-12-02 – 2017-12-03 (×2): 75 mg via ORAL
  Filled 2017-12-01 (×2): qty 1

## 2017-12-01 MED ORDER — POLYVINYL ALCOHOL 1.4 % OP SOLN: 1.0000 [drp] | Freq: Three times a day (TID) | OPHTHALMIC | Status: DC | PRN

## 2017-12-01 NOTE — H&P (Signed)
History and Physical    Leah Ferrell [id number] DOB: April 23, 1950 DOA: 12/01/2017  PCP: Sharion Balloon, FNP   Patient coming from: Home.  I have personally briefly reviewed patient's old medical records in [place]  Chief Complaint: Shortness of breath.  HPI: Leah Ferrell is a 67 y.o. female with medical history significant of CAD, chronic diastolic CHF, type 2 diabetes, history of arrhythmia, environmental and seasonal allergies, hyperlipidemia, hypertension, leukocytosis, morbid obesity, CAD, history of NSTEMI, peripheral vascular disease who is coming to the emergency department with complaints of shortness of breath since about a week associated with lower extremity edema and fatigue.  She also reports having pressure-like chest pain, radiated, as a numbness sensation, to her left arm the previous night, but denies further chest pain today.  No fever, but complains of chills and fatigue.  No sick contacts or travel history.  No rhinorrhea, mild sore throat.  She had mild wheezing on initial EDP exam.  She complains of frequent positional dizziness.  Occasional palpitations.  She denies abdominal pain, nausea, emesis, diarrhea, constipation, melena or hematochezia.  No dysuria, frequency or hematuria.  No polyuria, polydipsia, polyphagia or blurred vision.  Denies skin pruritus.  ED Course: Initial vital signs temperature 98.2 F, pulse 65, respirations 20, blood pressure 137/99 mmHg and O2 sat 97% on room air.  She was given supplemental oxygen, furosemide 60 mg IVP x1 and dextrose 12.5 g IVP after her CBG was 37 and serum glucose was 43 mg/dL.  Her troponin was normal.  BNP mildly elevated at 157.0 pg/mL.  Her chest radiograph showed cardiomegaly with interstitial edema.  Please see images and full radiology report for further detail.  Review of Systems: As per HPI otherwise 10 point review of systems negative.  Past Medical History:  Diagnosis Date  . CAD (coronary  artery disease)    a. 04/2012 NSTEMI: s/p DES to LAD.  Marland Kitchen Chronic diastolic CHF (congestive heart failure) ([place])   . Diabetes mellitus without complication ([place])   . Dysrhythmia   . Environmental and seasonal allergies   . Hyperlipidemia   . Hypertension   . Leukocytosis   . Morbid obesity ([place])   . NSTEMI (non-ST elevated myocardial infarction) ([place]) 04/27/2012  . PONV (postoperative nausea and vomiting)   . PVD (peripheral vascular disease) ([place])    a. 04/2012 ABI: R 0.49, L 0.39. Angiography 06/14: Significant ostial left common iliac artery stenosis extending into the distal aorta, severe left common femoral artery stenosis, bilateral SFA occlusion with heavy calcifications. Functionally, one-vessel runoff bilaterally below the knee  . Shortness of breath     Past Surgical History:  Procedure Laterality Date  . ABDOMINAL AORTAGRAM N/A 07/21/2012   Procedure: ABDOMINAL Maxcine Ham;  Surgeon: Wellington Hampshire, MD;  Location: [facility] CATH LAB;  Service: Cardiovascular;  Laterality: N/A;  . CARDIAC CATHETERIZATION     stents X 2  . CORONARY ANGIOGRAM  04/27/2012   Procedure: CORONARY ANGIOGRAM;  Surgeon: Thayer Headings, MD;  Location: [facility] CATH LAB;  Service: Cardiovascular;;  . ENDARTERECTOMY FEMORAL Left 11/02/2013   Procedure: RESECTION LEFT COMMON FEMORAL ARTERY AND INSERTION OF INTERPOSITION 63mm HEMASHIELD GRAFT FROM LEFT EXTERNAL  ILIAC ARTERY TO LEFT PROFUNDA  FEMORIS ARTERY;  Surgeon: Mal Misty, MD;  Location: [facility];  Service: Vascular;  Laterality: Left;  . EYE SURGERY Right 2017  . FRACTURE SURGERY Right Jul 07, 2014   Right Foot-  [place]  [facility]  by Heat duct  .  ILIAC ARTERY STENT Left 08/31/2013  . LAD stent    . LEFT HEART CATHETERIZATION WITH CORONARY ANGIOGRAM N/A 04/23/2012   Procedure: LEFT HEART CATHETERIZATION WITH CORONARY ANGIOGRAM;  Surgeon: Peter M Martinique, MD;  Location: [facility] CATH LAB;  Service: Cardiovascular;  Laterality: N/A;  . LOWER EXTREMITY ANGIOGRAM Left 08/31/2013     Procedure: LOWER EXTREMITY ANGIOGRAM;  Surgeon: Wellington Hampshire, MD;  Location: [facility] CATH LAB;  Service: Cardiovascular;  Laterality: Left;  . PERCUTANEOUS STENT INTERVENTION Left 08/31/2013   Procedure: PERCUTANEOUS STENT INTERVENTION;  Surgeon: Wellington Hampshire, MD;  Location: [facility] CATH LAB;  Service: Cardiovascular;  Laterality: Left;  Common Iliac artery  . PERIPHERAL VASCULAR CATHETERIZATION N/A 03/19/2016   Procedure: Abdominal Aortogram w/Lower Extremity;  Surgeon: Wellington Hampshire, MD;  Location: [facility] CV LAB;  Service: Cardiovascular;  Laterality: N/A;  . PERIPHERAL VASCULAR CATHETERIZATION  03/19/2016   Procedure: Peripheral Vascular Balloon Angioplasty-CFA Left;  Surgeon: Wellington Hampshire, MD;  Location: [facility] CV LAB;  Service: Cardiovascular;;  . TUMOR REMOVAL     tumor removed from [place]     reports that she quit smoking about 16 years ago. Her smoking use included cigarettes. She started smoking about 53 years ago. She has a 36.00 pack-year smoking history. She has never used smokeless tobacco. She reports that she does not drink alcohol or use drugs.  Allergies  Allergen Reactions  . Tape Rash    Family History  Problem Relation Age of Onset  . Diabetes Mother   . Hyperlipidemia Mother   . Hypertension Mother   . Peripheral vascular disease Mother   . Diabetes Maternal Grandmother   . Heart attack Maternal Grandfather   . Heart disease Brother   . Other Sister        drowned  . Diabetes Brother   . Alcohol abuse Brother   . Other Sister        drowned  . Diabetes Daughter        borderline  . Hypertension Daughter   . Diabetes Son   . Hypertension Son   . Hyperlipidemia Son    Prior to Admission medications   Medication Sig Start Date End Date Taking? Authorizing Provider  albuterol (PROVENTIL HFA) 108 (90 Base) MCG/ACT inhaler INHALE ONE TO TWO PUFFS BY  MOUTH EVERY 6 HOURS AS  NEEDED FOR WHEEZING OR FOR  SHORTNESS OF BREATH Patient taking  differently: Inhale 1-2 puffs into the lungs every 6 (six) hours as needed for wheezing or shortness of breath.  03/02/17  Yes Hawks, Christy A, FNP  amLODipine (NORVASC) 10 MG tablet Take 1 tablet (10 mg total) by mouth daily. 09/11/17  Yes Hawks, Christy A, FNP  Ascorbic Acid (VITAMIN C) 1000 MG tablet Take 1,000 mg by mouth 2 (two) times daily.   Yes [provider]  aspirin EC 81 MG tablet Take 81 mg by mouth every morning.    Yes [provider]  Calcium Carbonate-Vit D-Min (CALTRATE 600+D PLUS MINERALS) 600-800 MG-UNIT TABS Take 1 tablet by mouth 3 (three) times daily.    Yes [provider]  clopidogrel (PLAVIX) 75 MG tablet TAKE 1 TABLET BY MOUTH  DAILY Patient taking differently: Take 75 mg by mouth daily.  10/28/17  Yes Hawks, Christy A, FNP  fish oil-omega-3 fatty acids 1000 MG capsule Take 1 g by mouth every morning.   Yes [provider]  furosemide (LASIX) 40 MG tablet TAKE 1 TABLET DAILY MAY TAKE ADDITIONAL TABLET AS  NEEDED FOR SWELLING Patient taking differently: Take 40 mg by mouth daily. ** MAY TAKE ADDITIONAL TABLET AS NEEDED FOR SWELLING 09/11/17  Yes Hawks, Christy A, FNP  HUMALOG KWIKPEN 200 UNIT/ML SOPN INJECT 75 UNITS INTO THE  SKIN 3 TIMES DAILY Patient taking differently: Inject 75 Units into the skin 3 (three) times daily.  09/30/17  Yes Hawks, Christy A, FNP  hydrALAZINE (APRESOLINE) 25 MG tablet Take 1 tablet (25 mg total) by mouth 3 (three) times daily. 10/22/17 01/20/18 Yes Branch, Alphonse Guild, MD  hydroxypropyl methylcellulose (ISOPTO TEARS) 2.5 % ophthalmic solution Place 1 drop into both eyes 3 (three) times daily as needed (for dry eyes).   Yes [provider]  lisinopril (PRINIVIL,ZESTRIL) 20 MG tablet Take 2 tablets (40 mg total) by mouth daily. Patient taking differently: Take 20 mg by mouth 2 (two) times daily.  09/11/17  Yes Hawks, Christy A, FNP  metoprolol tartrate (LOPRESSOR) 100 MG tablet Take 1 tablet (100 mg total) by  mouth 2 (two) times daily. 09/11/17  Yes Hawks, Christy A, FNP  montelukast (SINGULAIR) 10 MG tablet Take 1 tablet (10 mg total) by mouth daily with breakfast. 09/11/17  Yes Hawks, Christy A, FNP  nitroGLYCERIN (NITROSTAT) 0.4 MG SL tablet Place 1 tablet (0.4 mg total) under the tongue every 5 (five) minutes x 3 doses as needed for chest pain (up to 3 doses). 03/10/16  Yes Hawks, Christy A, FNP  potassium chloride SA (K-DUR,KLOR-CON) 20 MEQ tablet Take 1 tablet (20 mEq total) by mouth daily. Patient taking differently: Take 10 mEq by mouth daily.  09/11/17  Yes Hawks, Christy A, FNP  rosuvastatin (CRESTOR) 40 MG tablet Take 1 tablet (40 mg total) by mouth daily. 09/11/17 12/10/17 Yes Hawks, Christy A, FNP  triamcinolone cream (KENALOG) 0.1 % Apply 1 application topically 2 (two) times daily. 10/12/17  Yes Hawks, Alyse Low A, FNP  Insulin Pen Needle (RELION PEN NEEDLES) 29G X 12MM MISC Three times a day 01/31/16   Evelina Dun A, FNP  RELION PEN NEEDLES 29G X 12MM MISC USE 1 NEEDLE TO INJECT INSULIN SUBCUTANEOUSLY 3 TIMES DAILY 01/31/16   Sharion Balloon, FNP    Physical Exam: Vitals:   12/01/17 1930 12/01/17 1945 12/01/17 2000 12/01/17 2052  BP: (!) 169/49   (!) 148/83  Pulse:  64 65 66  Resp: (!) 24  (!) 25 19  Temp:    98.6 F (37 C)  TempSrc:    Oral  SpO2:  94% 95% 95%  Weight:    97.1 kg  Height:    5' 2.5" (1.588 m)    Constitutional: Looks acutely ill.   Eyes: PERRL, lids and conjunctivae normal ENMT: Mucous membranes are moist. Posterior pharynx clear of any exudate or lesions.  Neck: normal, supple, no JVD, no masses, no thyromegaly Respiratory: Bibasilar crackles.  Mildly tachypneic. No accessory muscle use.  Cardiovascular: Regular rate and rhythm, no murmurs / rubs / gallops.  2+ lower extremities pitting edema. 2+ pedal pulses. No carotid bruits.  Abdomen: Obese, soft, no tenderness, no masses palpated. No hepatosplenomegaly. Bowel sounds positive.  Musculoskeletal: no  clubbing / cyanosis. Good ROM, no contractures. Normal muscle tone.  Skin: no rashes, lesions, ulcers on limited dermatological examination. Neurologic: CN 2-12 grossly intact. Sensation intact, DTR normal. Strength 5/5 in all 4.  Psychiatric:  Alert and oriented x 3.  Mildly anxious mood.   Labs on Admission: I have personally reviewed following labs and imaging studies  CBC: Recent Labs  Lab  12/01/17 1745  WBC 13.4*  NEUTROABS 8.3*  HGB 13.2  HCT 41.9  MCV 94.6  PLT 660   Basic Metabolic Panel: Recent Labs  Lab 12/01/17 1745  NA 137  K 3.6  CL 100  CO2 30  GLUCOSE 43*  BUN 10  CREATININE 0.72  CALCIUM 9.3  MG 2.1  PHOS 3.1   GFR: Estimated Creatinine Clearance: 76 mL/min (by C-G formula based on SCr of 0.72 mg/dL). Liver Function Tests: Recent Labs  Lab 12/01/17 1745  AST 38  ALT 58*  ALKPHOS 58  BILITOT 0.6  PROT 8.1  ALBUMIN 3.9   No results for input(s): LIPASE, AMYLASE in the last 168 hours. No results for input(s): AMMONIA in the last 168 hours. Coagulation Profile: No results for input(s): INR, PROTIME in the last 168 hours. Cardiac Enzymes: Recent Labs  Lab 12/01/17 1745  TROPONINI <0.03   BNP (last 3 results) No results for input(s): PROBNP in the last 8760 hours. HbA1C: No results for input(s): HGBA1C in the last 72 hours. CBG: Recent Labs  Lab 12/01/17 1751 12/01/17 1840 12/01/17 2145  GLUCAP 37* 124* 101*   Lipid Profile: No results for input(s): CHOL, HDL, LDLCALC, TRIG, CHOLHDL, LDLDIRECT in the last 72 hours. Thyroid Function Tests: No results for input(s): TSH, T4TOTAL, FREET4, T3FREE, THYROIDAB in the last 72 hours. Anemia Panel: No results for input(s): VITAMINB12, FOLATE, FERRITIN, TIBC, IRON, RETICCTPCT in the last 72 hours. Urine analysis:    Component Value Date/Time   COLORURINE YELLOW 10/27/2013 1341   APPEARANCEUR Clear 04/24/2016 1156   LABSPEC 1.009 10/27/2013 1341   PHURINE 6.5 10/27/2013 1341   GLUCOSEU  Negative 04/24/2016 1156   HGBUR NEGATIVE 10/27/2013 1341   BILIRUBINUR Negative 04/24/2016 1156   KETONESUR NEGATIVE 10/27/2013 1341   PROTEINUR Negative 04/24/2016 1156   PROTEINUR NEGATIVE 10/27/2013 1341   UROBILINOGEN 0.2 10/27/2013 1341   NITRITE Negative 04/24/2016 1156   NITRITE NEGATIVE 10/27/2013 1341   LEUKOCYTESUR Negative 04/24/2016 1156    Radiological Exams on Admission: Dg Chest 2 View  Result Date: 12/01/2017 CLINICAL DATA:  Intermittent shortness of breath over the last week. Chest pain. EXAM: CHEST - 2 VIEW COMPARISON:  03/01/2015 FINDINGS: The heart is enlarged. There is aortic atherosclerosis. There is pulmonary venous hypertension with mild interstitial edema. May be a small amount of pleural fluid. No consolidation or collapse. IMPRESSION: Congestive heart failure pattern with interstitial edema. Electronically Signed   By: Nelson Chimes M.D.   On: 12/01/2017 18:41   03/27/2016 echocardiogram  ------------------------------------------------------------------- LV EF: 65% -   70%  ------------------------------------------------------------------- Indications:      R01.1 Murmur.  ------------------------------------------------------------------- History:   PMH:  Acquired from the patient and from the patient&'s chart.  PMH:  CAD. Chronic Diastolic Heart Failure. Dysrhythmia. NSTEMI. Peripheral Vascular Disease. Shortness of Breath.  Risk factors:  Hypertension. Diabetes mellitus. Dyslipidemia.  ------------------------------------------------------------------- Study Conclusions  - Left ventricle: The cavity size was normal. Wall thickness was   increased in a pattern of severe LVH. Systolic function was   vigorous. The estimated ejection fraction was in the range of 65%   to 70%. Wall motion was normal; there were no regional wall   motion abnormalities. Doppler parameters are consistent with   abnormal left ventricular relaxation (grade 1  diastolic   dysfunction). Doppler parameters are consistent with high   ventricular filling pressure. - Mitral valve: Calcified annulus. Mildly thickened leaflets . - Left atrium: The atrium was moderately dilated.  Impressions:  -  Vigorous LV systolic function; severe LVH; grade 1 diastolic   dysfunction with elevated LV filling pressure; sclerotic aortic   valve; moderate LAE.  EKG: Independently reviewed.  Vent. rate 64 BPM PR interval * ms QRS duration 89 ms QT/QTc 432/446 ms P-R-T axes 74 25 58 Sinus rhythm Probable left atrial enlargement.  Assessment/Plan Principal Problem:   Acute on chronic diastolic congestive heart failure (HCC) Observation/stepdown. Continue supplemental oxygen. Continue furosemide 40 mg IVP daily. Monitor daily weights, intake and output. Check echocardiogram.  Active Problems:   Hypoglycemia Was given D50 in the emergency department. Briefly started on dextrose 10%, but the patient had Solu-Medrol in the ED. Dextrose 10% infusion was discontinued.  Her CBGs.    Hypertension associated with diabetes ([place]) Continue lisinopril and metoprolol. Monitor blood pressure, heart rate, renal function electrolytes.    Hyperlipidemia associated with type 2 diabetes mellitus (HCC) Continue Crestor 40 mg p.o. daily. Monitor LFTs as needed.    CAD (coronary artery disease) Continue antiplatelet therapy, metoprolol and statin.    PAD (peripheral artery disease) (HCC) Continue Crestor, aspirin and Plavix. Follow-up with vascular surgery as scheduled.    Dizziness Frequent positional dizziness. May be related to antihypertensive therapy.   However, will check carotid Dopplers.   DVT prophylaxis: Heparin SQ. Code Status: Full code. Family Communication: Her daughter was present in the ED room. Disposition Plan: Admit for CHF exacerbation treatment. Consults called:  Admission status: Observation/stepdown.   Reubin Milan MD [facility] Pager [phone].  If 7PM-7AM, please contact night-coverage [email or web address] Password [email or web address]  12/01/2017, 10:20 PM

## 2017-12-01 NOTE — ED Provider Notes (Addendum)
[facility] EMERGENCY DEPARTMENT Provider Note   CSN: [id number] Arrival date & time: 12/01/17  1726     History   Chief Complaint Chief Complaint  Patient presents with  . Shortness of Breath    HPI Leah Ferrell is a 67 y.o. female.  HPI Patient with multiple medical issues including CAD, CHF, diabetes, hypertension presents with dyspnea, fatigue. No persistent pain, but she notes that over the past 2 weeks, she has had ongoing worsening weakness, dyspnea. No cough, no fever. She notes that she always has some difficulty with weight gain. No relief in spite of using her albuterol frequently, or with anything else. EMS notes that the patient had oxygen saturation of 88% on room air on their arrival, though this improved with 50 L of supplemental oxygen.  Past Medical History:  Diagnosis Date  . CAD (coronary artery disease)    a. 04/2012 NSTEMI: s/p DES to LAD.  Marland Kitchen Chronic diastolic CHF (congestive heart failure) ([place])   . Diabetes mellitus without complication ([place])   . Dysrhythmia   . Environmental and seasonal allergies   . Hyperlipidemia   . Hypertension   . Leukocytosis   . Morbid obesity ([place])   . NSTEMI (non-ST elevated myocardial infarction) ([place]) 04/27/2012  . PONV (postoperative nausea and vomiting)   . PVD (peripheral vascular disease) ([place])    a. 04/2012 ABI: R 0.49, L 0.39. Angiography 06/14: Significant ostial left common iliac artery stenosis extending into the distal aorta, severe left common femoral artery stenosis, bilateral SFA occlusion with heavy calcifications. Functionally, one-vessel runoff bilaterally below the knee  . Shortness of breath     Patient Active Problem List   Diagnosis Date Noted  . Morbid obesity ([place]) 06/10/2017  . PMB (postmenopausal bleeding) 11/12/2015  . Hypokalemia 11/23/2014  . Leg pain, left 07/06/2014  . Vaginal atrophy 05/08/2014  . Left leg swelling 01/17/2014  . PAD (peripheral artery disease) ([place]) 10/18/2013    . Chronic diastolic heart failure ([place]) 08/27/2012  . Carotid bruit 08/27/2012  . PVD (peripheral vascular disease) ([place])   . Precordial pain 04/30/2012  . CAD (coronary artery disease) 04/28/2012  . Hx of non-ST elevation myocardial infarction (NSTEMI) 04/23/2012  . Hypertension associated with diabetes ([place])   . Diabetes mellitus with peripheral vascular disease ([place])   . Hyperlipidemia associated with type 2 diabetes mellitus [facility])     Past Surgical History:  Procedure Laterality Date  . ABDOMINAL AORTAGRAM N/A 07/21/2012   Procedure: ABDOMINAL Maxcine Ham;  Surgeon: Wellington Hampshire, MD;  Location: [place] CATH LAB;  Service: Cardiovascular;  Laterality: N/A;  . CARDIAC CATHETERIZATION     stents X 2  . CORONARY ANGIOGRAM  04/27/2012   Procedure: CORONARY ANGIOGRAM;  Surgeon: Thayer Headings, MD;  Location: [facility] CATH LAB;  Service: Cardiovascular;;  . ENDARTERECTOMY FEMORAL Left 11/02/2013   Procedure: RESECTION LEFT COMMON FEMORAL ARTERY AND INSERTION OF INTERPOSITION 70mm HEMASHIELD GRAFT FROM LEFT EXTERNAL  ILIAC ARTERY TO LEFT PROFUNDA  FEMORIS ARTERY;  Surgeon: Mal Misty, MD;  Location: [place];  Service: Vascular;  Laterality: Left;  . EYE SURGERY Right 2017  . FRACTURE SURGERY Right Jul 07, 2014   Right Foot-  Pt.fell  at Home  by Heat duct  . ILIAC ARTERY STENT Left 08/31/2013  . LAD stent    . LEFT HEART CATHETERIZATION WITH CORONARY ANGIOGRAM N/A 04/23/2012   Procedure: LEFT HEART CATHETERIZATION WITH CORONARY ANGIOGRAM;  Surgeon: Peter M Martinique, MD;  Location: [facility] CATH LAB;  Service: Cardiovascular;  Laterality: N/A;  . LOWER EXTREMITY ANGIOGRAM Left 08/31/2013   Procedure: LOWER EXTREMITY ANGIOGRAM;  Surgeon: Wellington Hampshire, MD;  Location: [place] CATH LAB;  Service: Cardiovascular;  Laterality: Left;  . PERCUTANEOUS STENT INTERVENTION Left 08/31/2013   Procedure: PERCUTANEOUS STENT INTERVENTION;  Surgeon: Wellington Hampshire, MD;  Location: [facility];  Service: Cardiovascular;   Laterality: Left;  Common Iliac artery  . PERIPHERAL VASCULAR CATHETERIZATION N/A 03/19/2016   Procedure: Abdominal Aortogram w/Lower Extremity;  Surgeon: Wellington Hampshire, MD;  Location: [place] CV LAB;  Service: Cardiovascular;  Laterality: N/A;  . PERIPHERAL VASCULAR CATHETERIZATION  03/19/2016   Procedure: Peripheral Vascular Balloon Angioplasty-CFA Left;  Surgeon: Wellington Hampshire, MD;  Location: [place] CV LAB;  Service: Cardiovascular;;  . 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 Authorizing Provider  albuterol (PROVENTIL HFA) 108 (90 Base) MCG/ACT inhaler INHALE ONE TO TWO PUFFS BY  MOUTH EVERY 6 HOURS AS  NEEDED FOR WHEEZING OR FOR  SHORTNESS OF BREATH Patient taking differently: Inhale 1-2 puffs into the lungs every 6 (six) hours as needed for wheezing or shortness of breath.  03/02/17  Yes Hawks, Christy A, FNP  amLODipine (NORVASC) 10 MG tablet Take 1 tablet (10 mg total) by mouth daily. 09/11/17  Yes Hawks, Christy A, FNP  Ascorbic Acid (VITAMIN C) 1000 MG tablet Take 1,000 mg by mouth 2 (two) times daily.   Yes [provider]  aspirin EC 81 MG tablet Take 81 mg by mouth every morning.    Yes [provider]  Calcium Carbonate-Vit D-Min (CALTRATE 600+D PLUS MINERALS) 600-800 MG-UNIT TABS Take 1 tablet by mouth 3 (three) times daily.    Yes [provider]  clopidogrel (PLAVIX) 75 MG tablet TAKE 1 TABLET BY MOUTH  DAILY Patient taking differently: Take 75 mg by mouth daily.  10/28/17  Yes Hawks, Christy A, FNP  fish oil-omega-3 fatty acids 1000 MG capsule Take 1 g by mouth every morning.   Yes [provider]  furosemide (LASIX) 40 MG tablet TAKE 1 TABLET DAILY  MAY TAKE ADDITIONAL TABLET AS NEEDED FOR SWELLING Patient taking differently: Take 40 mg by mouth daily. ** MAY TAKE ADDITIONAL TABLET AS NEEDED FOR SWELLING 09/11/17  Yes Hawks, Christy A, FNP  HUMALOG KWIKPEN 200 UNIT/ML SOPN INJECT 75 UNITS INTO THE  SKIN 3 TIMES DAILY Patient taking differently: Inject 75 Units into the skin 3 (three) times daily.  09/30/17  Yes Hawks, Christy A, FNP  hydrALAZINE (APRESOLINE) 25 MG tablet Take 1 tablet (25 mg total) by mouth 3 (three) times daily. 10/22/17 01/20/18 Yes Branch, Alphonse Guild, MD  hydroxypropyl methylcellulose (ISOPTO TEARS) 2.5 % ophthalmic solution Place 1 drop into both eyes 3 (three) times daily as needed (for dry eyes).   Yes [provider]  lisinopril (PRINIVIL,ZESTRIL) 20 MG tablet Take 2 tablets (40 mg total)  by mouth daily. Patient taking differently: Take 20 mg by mouth 2 (two) times daily.  09/11/17  Yes Hawks, Christy A, FNP  metoprolol tartrate (LOPRESSOR) 100 MG tablet Take 1 tablet (100 mg total) by mouth 2 (two) times daily. 09/11/17  Yes Hawks, Christy A, FNP  montelukast (SINGULAIR) 10 MG tablet Take 1 tablet (10 mg total) by mouth daily with breakfast. 09/11/17  Yes Hawks, Christy A, FNP  nitroGLYCERIN (NITROSTAT) 0.4 MG SL tablet Place 1 tablet (0.4 mg total) under the tongue every 5 (five) minutes x 3 doses as needed for chest pain (up to 3 doses). 03/10/16  Yes Hawks, Christy A, FNP  potassium chloride SA (K-DUR,KLOR-CON) 20 MEQ tablet Take 1 tablet (20 mEq total) by mouth daily. Patient taking differently: Take 10 mEq by mouth daily.  09/11/17  Yes Hawks, Christy A, FNP  rosuvastatin (CRESTOR) 40 MG tablet Take 1 tablet (40 mg total) by mouth daily. 09/11/17 12/10/17 Yes Hawks, Christy A, FNP  triamcinolone cream (KENALOG) 0.1 % Apply 1 application topically 2 (two) times daily. 10/12/17  Yes Hawks, Christy A, FNP  Insulin Pen Needle (RELION PEN NEEDLES) 29G X 12MM MISC Three times a day 01/31/16   Evelina Dun A, FNP  RELION  PEN NEEDLES 29G X 12MM MISC USE 1 NEEDLE TO INJECT INSULIN SUBCUTANEOUSLY 3 TIMES DAILY 01/31/16   Sharion Balloon, FNP    Family History Family History  Problem Relation Age of Onset  . Diabetes Mother   . Hyperlipidemia Mother   . Hypertension Mother   . Peripheral vascular disease Mother   . Diabetes Maternal Grandmother   . Heart attack Maternal Grandfather   . Heart disease Brother   . Other Sister        drowned  . Diabetes Brother   . Alcohol abuse Brother   . Other Sister        drowned  . Diabetes Daughter        borderline  . Hypertension Daughter   . Diabetes Son   . Hypertension Son   . Hyperlipidemia Son     Social History Social History   Tobacco Use  . Smoking status: Former Smoker    Packs/day: 1.00    Years: 36.00    Pack years: 36.00    Types: Cigarettes    Start date: 04/21/1964    Last attempt to quit: 02/17/2001    Years since quitting: 16.7  . Smokeless tobacco: Never Used  Substance Use Topics  . Alcohol use: No    Alcohol/week: 0.0 standard drinks  . Drug use: No     Allergies   Tape   Review of Systems Review of Systems  Constitutional:       Per HPI, otherwise negative  HENT:       Per HPI, otherwise negative  Respiratory:       Per HPI, otherwise negative  Cardiovascular:       Per HPI, otherwise negative  Gastrointestinal: Negative for vomiting.  Endocrine:       Negative aside from HPI  Genitourinary:       Neg aside from HPI   Musculoskeletal:       Per HPI, otherwise negative  Skin: Negative.   Neurological: Positive for weakness. Negative for syncope.     Physical Exam Updated Vital Signs BP (!) 150/48   Pulse 65   Temp 98.2 F (36.8 C) (Oral)   Resp (!) 24   Wt 99.8 kg   SpO2 100%  BMI 40.24 kg/m   Physical Exam  Constitutional: She is oriented to person, place, and time. She appears well-developed and well-nourished. No distress.  HENT:  Head: Normocephalic and atraumatic.  Eyes: Conjunctivae and  EOM are normal.  Cardiovascular: Normal rate and regular rhythm.  Pulmonary/Chest: No stridor. Tachypnea noted. She has decreased breath sounds.  Abdominal: She exhibits no distension. There is no tenderness.  Musculoskeletal: She exhibits no edema.  Neurological: She is alert and oriented to person, place, and time. No cranial nerve deficit.  Skin: Skin is warm and dry.  Psychiatric: She has a normal mood and affect.  Nursing note and vitals reviewed.    ED Treatments / Results  Labs (all labs ordered are listed, but only abnormal results are displayed) Labs Reviewed  COMPREHENSIVE METABOLIC PANEL - Abnormal; Notable for the following components:      Result Value   Glucose, Bld 43 (*)    ALT 58 (*)    All other components within normal limits  CBC WITH DIFFERENTIAL/PLATELET - Abnormal; Notable for the following components:   WBC 13.4 (*)    RDW 15.8 (*)    Neutro Abs 8.3 (*)    All other components within normal limits  BRAIN NATRIURETIC PEPTIDE - Abnormal; Notable for the following components:   B Natriuretic Peptide 157.0 (*)    All other components within normal limits  CBG MONITORING, ED - Abnormal; Notable for the following components:   Glucose-Capillary 37 (*)    All other components within normal limits  CBG MONITORING, ED - Abnormal; Notable for the following components:   Glucose-Capillary 124 (*)    All other components within normal limits  TROPONIN I    EKG EKG Interpretation  Date/Time:  Tuesday December 01 2017 17:38:58 EDT Ventricular Rate:  64 PR Interval:    QRS Duration: 89 QT Interval:  432 QTC Calculation: 446 R Axis:   25 Text Interpretation:  Sinus rhythm Probable left atrial enlargement No STEMI.  Borderline EKG  Radiology Dg Chest 2 View  Result Date: 12/01/2017 CLINICAL DATA:  Intermittent shortness of breath over the last week. Chest pain. EXAM: CHEST - 2 VIEW COMPARISON:  03/01/2015 FINDINGS: The heart is enlarged. There is aortic  atherosclerosis. There is pulmonary venous hypertension with mild interstitial edema. May be a small amount of pleural fluid. No consolidation or collapse. IMPRESSION: Congestive heart failure pattern with interstitial edema. Electronically Signed   By: Nelson Chimes M.D.   On: 12/01/2017 18:41    Procedures Procedures (including critical care time)  Medications Ordered in ED Medications  furosemide (LASIX) injection 60 mg (has no administration in time range)  methylPREDNISolone sodium succinate (SOLU-MEDROL) 125 mg/2 mL injection 125 mg (125 mg Intravenous Given 12/01/17 1801)  albuterol (PROVENTIL) (2.5 MG/3ML) 0.083% nebulizer solution 5 mg (5 mg Nebulization Given 12/01/17 1836)  dextrose 50 % solution 50 mL (50 mLs Intravenous Given 12/01/17 1756)     Initial Impression / Assessment and Plan / ED Course  I have reviewed the triage vital signs and the nursing notes.  Pertinent labs & imaging results that were available during my care of the patient were reviewed by me and considered in my medical decision making (see chart for details).     7:05 PM Patient continues to require supplemental oxygen to maintain appropriate saturation. Labs all back, reviewed, discussed with the patient, notable for likely heart failure exacerbation given abnormal BNP, abnormal x-ray. Patient does have mild leukocytosis, but no fever.  Patient  has received IV Lasix, 60 mg. Patient will require admission for further evaluation and management.  Final Clinical Impressions(s) / ED Diagnoses  Hypoxia   Carmin Muskrat, MD 12/01/17 Drema Halon    Carmin Muskrat, MD 12/01/17 2119

## 2017-12-01 NOTE — ED Notes (Signed)
Pt back from x-ray.

## 2017-12-01 NOTE — ED Notes (Signed)
EDP gave verbal orders for low blood sugar, pt drinking apple juice

## 2017-12-01 NOTE — ED Notes (Signed)
CRITICAL VALUE ALERT  Critical Value: BS 43  Date & Time Notied:  12/01/17  Provider Notified:Dr Vanita Panda notified earlier PT bs was 32 in ED, verbal orders given.

## 2017-12-01 NOTE — ED Notes (Signed)
Pt complaining of SOB with any activity, even with ADL's.  EDP at the beside

## 2017-12-01 NOTE — ED Notes (Signed)
Pt feels hot, clammy, sweaty, states she has not eaten, EMS BS was in the 70's

## 2017-12-01 NOTE — ED Triage Notes (Signed)
Pt brought in by EMS due SOB the patient reports intermittent SOB for one week. Pt reports CP last night but has resolved. Sats 88 % on EMS arrival, now up to 98 % on 4 L via [place]

## 2017-12-01 NOTE — ED Notes (Signed)
Called RT

## 2017-12-01 NOTE — ED Notes (Signed)
Pt going to xray  

## 2017-12-02 ENCOUNTER — Encounter (HOSPITAL_COMMUNITY): Payer: Self-pay | Admitting: Family Medicine

## 2017-12-02 ENCOUNTER — Inpatient Hospital Stay (HOSPITAL_COMMUNITY): Payer: Medicare Other

## 2017-12-02 ENCOUNTER — Observation Stay (HOSPITAL_COMMUNITY): Payer: Medicare Other

## 2017-12-02 ENCOUNTER — Telehealth: Payer: Self-pay | Admitting: Family

## 2017-12-02 DIAGNOSIS — Z87891 Personal history of nicotine dependence: Secondary | ICD-10-CM | POA: Diagnosis not present

## 2017-12-02 DIAGNOSIS — E1151 Type 2 diabetes mellitus with diabetic peripheral angiopathy without gangrene: Secondary | ICD-10-CM | POA: Diagnosis present

## 2017-12-02 DIAGNOSIS — E162 Hypoglycemia, unspecified: Secondary | ICD-10-CM | POA: Diagnosis not present

## 2017-12-02 DIAGNOSIS — Z6841 Body Mass Index (BMI) 40.0 and over, adult: Secondary | ICD-10-CM | POA: Diagnosis not present

## 2017-12-02 DIAGNOSIS — R42 Dizziness and giddiness: Secondary | ICD-10-CM

## 2017-12-02 DIAGNOSIS — I5030 Unspecified diastolic (congestive) heart failure: Secondary | ICD-10-CM

## 2017-12-02 DIAGNOSIS — Z794 Long term (current) use of insulin: Secondary | ICD-10-CM | POA: Diagnosis not present

## 2017-12-02 DIAGNOSIS — I2510 Atherosclerotic heart disease of native coronary artery without angina pectoris: Secondary | ICD-10-CM

## 2017-12-02 DIAGNOSIS — I739 Peripheral vascular disease, unspecified: Secondary | ICD-10-CM | POA: Diagnosis not present

## 2017-12-02 DIAGNOSIS — R0902 Hypoxemia: Secondary | ICD-10-CM | POA: Diagnosis present

## 2017-12-02 DIAGNOSIS — Z8249 Family history of ischemic heart disease and other diseases of the circulatory system: Secondary | ICD-10-CM | POA: Diagnosis not present

## 2017-12-02 DIAGNOSIS — Z91048 Other nonmedicinal substance allergy status: Secondary | ICD-10-CM | POA: Diagnosis not present

## 2017-12-02 DIAGNOSIS — Z7902 Long term (current) use of antithrombotics/antiplatelets: Secondary | ICD-10-CM | POA: Diagnosis not present

## 2017-12-02 DIAGNOSIS — E1159 Type 2 diabetes mellitus with other circulatory complications: Secondary | ICD-10-CM

## 2017-12-02 DIAGNOSIS — E1169 Type 2 diabetes mellitus with other specified complication: Secondary | ICD-10-CM | POA: Diagnosis not present

## 2017-12-02 DIAGNOSIS — E11649 Type 2 diabetes mellitus with hypoglycemia without coma: Secondary | ICD-10-CM | POA: Diagnosis present

## 2017-12-02 DIAGNOSIS — Z7982 Long term (current) use of aspirin: Secondary | ICD-10-CM | POA: Diagnosis not present

## 2017-12-02 DIAGNOSIS — Z955 Presence of coronary angioplasty implant and graft: Secondary | ICD-10-CM | POA: Diagnosis not present

## 2017-12-02 DIAGNOSIS — E785 Hyperlipidemia, unspecified: Secondary | ICD-10-CM | POA: Diagnosis not present

## 2017-12-02 DIAGNOSIS — I5033 Acute on chronic diastolic (congestive) heart failure: Secondary | ICD-10-CM | POA: Diagnosis not present

## 2017-12-02 DIAGNOSIS — Z833 Family history of diabetes mellitus: Secondary | ICD-10-CM | POA: Diagnosis not present

## 2017-12-02 DIAGNOSIS — Z6838 Body mass index (BMI) 38.0-38.9, adult: Secondary | ICD-10-CM | POA: Diagnosis not present

## 2017-12-02 DIAGNOSIS — I6523 Occlusion and stenosis of bilateral carotid arteries: Secondary | ICD-10-CM | POA: Diagnosis not present

## 2017-12-02 DIAGNOSIS — I10 Essential (primary) hypertension: Secondary | ICD-10-CM | POA: Diagnosis not present

## 2017-12-02 DIAGNOSIS — J302 Other seasonal allergic rhinitis: Secondary | ICD-10-CM | POA: Diagnosis present

## 2017-12-02 DIAGNOSIS — Z79899 Other long term (current) drug therapy: Secondary | ICD-10-CM | POA: Diagnosis not present

## 2017-12-02 DIAGNOSIS — I252 Old myocardial infarction: Secondary | ICD-10-CM | POA: Diagnosis not present

## 2017-12-02 DIAGNOSIS — I110 Hypertensive heart disease with heart failure: Secondary | ICD-10-CM | POA: Diagnosis present

## 2017-12-02 LAB — GLUCOSE, CAPILLARY
GLUCOSE-CAPILLARY: 269 mg/dL — AB (ref 70–99)
GLUCOSE-CAPILLARY: 270 mg/dL — AB (ref 70–99)
GLUCOSE-CAPILLARY: 250 mg/dL — AB (ref 70–99)
GLUCOSE-CAPILLARY: 372 mg/dL — AB (ref 70–99)
GLUCOSE-CAPILLARY: 339 mg/dL — AB (ref 70–99)
Glucose-Capillary: 276 mg/dL — ABNORMAL HIGH (ref 70–99)
Glucose-Capillary: 297 mg/dL — ABNORMAL HIGH (ref 70–99)
Glucose-Capillary: 303 mg/dL — ABNORMAL HIGH (ref 70–99)
Glucose-Capillary: 310 mg/dL — ABNORMAL HIGH (ref 70–99)

## 2017-12-02 LAB — BASIC METABOLIC PANEL
Anion gap: 10 (ref 5–15)
BUN: 14 mg/dL (ref 8–23)
CHLORIDE: 96 mmol/L — AB (ref 98–111)
CO2: 27 mmol/L (ref 22–32)
CREATININE: 0.71 mg/dL (ref 0.44–1.00)
Calcium: 9 mg/dL (ref 8.9–10.3)
GFR calc non Af Amer: >60 mL/min (ref >60)
GLUCOSE: 289 mg/dL — AB (ref 70–99)
Potassium: 4.5 mmol/L (ref 3.5–5.1)
Sodium: 133 mmol/L — ABNORMAL LOW (ref 135–145)

## 2017-12-02 LAB — ECHOCARDIOGRAM COMPLETE
HEIGHTINCHES: 62.5 in
Weight: 3460.34 oz

## 2017-12-02 LAB — TROPONIN I: Troponin I: <0.03 ng/mL (ref <0.03)

## 2017-12-02 LAB — HEMOGLOBIN A1C
HEMOGLOBIN A1C: 7.7 % — AB (ref 4.8–5.6)
MEAN PLASMA GLUCOSE: 174.29 mg/dL

## 2017-12-02 LAB — MRSA PCR SCREENING: MRSA by PCR: NEGATIVE

## 2017-12-02 MED ORDER — LISINOPRIL 10 MG PO TABS
20.0000 mg | ORAL_TABLET | Freq: Every day | ORAL | Status: DC
  Administered 2017-12-03: 20 mg via ORAL
  Filled 2017-12-02: qty 2

## 2017-12-02 MED ORDER — INSULIN ASPART 100 UNIT/ML ~~LOC~~ SOLN: 14.0000 [IU] | Freq: Three times a day (TID) | SUBCUTANEOUS | Status: DC

## 2017-12-02 MED ORDER — INSULIN ASPART 100 UNIT/ML ~~LOC~~ SOLN: 0.0000 [IU] | Freq: Every day | SUBCUTANEOUS | Status: DC

## 2017-12-02 MED ORDER — IOPAMIDOL (ISOVUE-370) INJECTION 76%
75.0000 mL | Freq: Once | INTRAVENOUS | Status: AC | PRN
  Administered 2017-12-02: 75 mL via INTRAVENOUS

## 2017-12-02 MED ORDER — INSULIN ASPART 100 UNIT/ML ~~LOC~~ SOLN
10.0000 [IU] | Freq: Three times a day (TID) | SUBCUTANEOUS | Status: DC
  Administered 2017-12-02: 10 [IU] via SUBCUTANEOUS

## 2017-12-02 MED ORDER — FUROSEMIDE 10 MG/ML IJ SOLN: 40.0000 mg | Freq: Two times a day (BID) | INTRAMUSCULAR | Status: DC

## 2017-12-02 MED ORDER — INSULIN ASPART 100 UNIT/ML ~~LOC~~ SOLN
20.0000 [IU] | Freq: Three times a day (TID) | SUBCUTANEOUS | Status: DC
  Administered 2017-12-02 – 2017-12-03 (×2): 20 [IU] via SUBCUTANEOUS

## 2017-12-02 MED ORDER — INSULIN ASPART 100 UNIT/ML ~~LOC~~ SOLN
8.0000 [IU] | Freq: Once | SUBCUTANEOUS | Status: AC
  Administered 2017-12-02: 8 [IU] via SUBCUTANEOUS

## 2017-12-02 MED ORDER — FUROSEMIDE 10 MG/ML IJ SOLN
20.0000 mg | Freq: Two times a day (BID) | INTRAMUSCULAR | Status: DC
  Administered 2017-12-02 – 2017-12-03 (×2): 20 mg via INTRAVENOUS
  Filled 2017-12-02 (×2): qty 2

## 2017-12-02 MED ORDER — INSULIN ASPART 100 UNIT/ML ~~LOC~~ SOLN
0.0000 [IU] | Freq: Three times a day (TID) | SUBCUTANEOUS | Status: DC
  Administered 2017-12-02: 15 [IU] via SUBCUTANEOUS
  Administered 2017-12-02: 20 [IU] via SUBCUTANEOUS
  Administered 2017-12-03: 2 [IU] via SUBCUTANEOUS

## 2017-12-02 MED ORDER — INSULIN ASPART 100 UNIT/ML ~~LOC~~ SOLN
0.0000 [IU] | Freq: Three times a day (TID) | SUBCUTANEOUS | Status: DC
  Administered 2017-12-02: 5 [IU] via SUBCUTANEOUS

## 2017-12-02 MED ORDER — INSULIN GLARGINE 100 UNIT/ML ~~LOC~~ SOLN
50.0000 [IU] | Freq: Every day | SUBCUTANEOUS | Status: DC
  Administered 2017-12-02: 50 [IU] via SUBCUTANEOUS
  Filled 2017-12-02 (×2): qty 0.5

## 2017-12-02 NOTE — Progress Notes (Signed)
PROGRESS NOTE  Leah Ferrell  [id number]  DOB: July 27, 1950  DOA: 12/01/2017 PCP: Sharion Balloon, FNP   Brief Admission Hx: Leah Ferrell is a 67 y.o. female with medical history significant of CAD, chronic diastolic CHF, type 2 diabetes, history of arrhythmia, environmental and seasonal allergies, hyperlipidemia, hypertension, leukocytosis, morbid obesity, CAD, history of NSTEMI, peripheral vascular disease who is coming to the emergency department with complaints of shortness of breath since about a week associated with lower extremity edema and fatigue.  She was admitted with pulmonary edema and being treated for acute CHF exacerbation.   MDM/Assessment & Plan:   1. Acute on chronic diastolic CHF exacerbation - Pt is due to have an echocardiogram today to evaluate systolic and diastolic function.  The patient is being diuresed with furosemide.  Monitor intake and output and weights.  Monitor electrolytes. 2. Hypoglycemia- upon review, patient is taking high doses of prandial insulin 75 units 3 times daily which likely precipitated her hypoglycemia.  Unfortunately she is now hyperglycemic and has been placed on a sliding scale coverage with much lower doses of prandial coverage.  We may also need to add a basal insulin.  We will continue to monitor and make adjustments as needed.  Monitor blood glucose closely with hypoglycemia precautions. 3. Hyperlipidemia-the patient is being maintained on Crestor 40 mg daily. 4. Coronary artery disease-she is followed by the outpatient cardiology heart care team and she is maintained on antiplatelets, metoprolol and Crestor. 5. Essential hypertension- apparently her hypertension has been very difficult to control and she is on high doses of multiple blood pressure medications however at this time her blood pressure is softer and we will need to monitor this closely.  We have cut back on some of her blood pressure medications to try and avoid  hypotension but we may have to add them back if she has a rebound hypertension. 6. Dizziness-patient reports frequent positional dizziness likely this is related to her blood pressure and being on so many antihypertensives.  She is getting an echocardiogram in addition to carotid Dopplers.  We will will follow up on those results.  DVT prophylaxis: Heparin Code Status: Full Family Communication: Patient updated at bedside Disposition Plan: Inpatient for IV diuresis  Subjective: Patient says she feels a lot better after diuresing.  The IV Lasix made her urinate quite a large amount.  She has no chest pain and no shortness of breath at this time.  Objective: Vitals:   12/02/17 0500 12/02/17 0600 12/02/17 0700 12/02/17 0812  BP: (!) 136/54 (!) 135/56 (!) 127/39   Pulse: 64 66 67 61  Resp: 16 16 18 20   Temp:  98 F (36.7 C)  98.2 F (36.8 C)  TempSrc:  Oral  Oral  SpO2:  97% 95% 98%  Weight:      Height:        Intake/Output Summary (Last 24 hours) at 12/02/2017 0932 Last data filed at 12/02/2017 0500 Gross per 24 hour  Intake 50 ml  Output -  Net 50 ml   Filed Weights   12/01/17 1733 12/01/17 2052 12/02/17 0208  Weight: 99.8 kg 97.1 kg 98.1 kg     REVIEW OF SYSTEMS  As per history otherwise all reviewed and reported negative  Exam:  General exam: Awake, alert, no apparent distress, cooperative and pleasant. Respiratory system: Rare bibasilar crackles no increased work of breathing. Cardiovascular system: S1 & S2 heard, holosystolic murmur heard. Gastrointestinal system: Abdomen is nondistended, soft and nontender.  Normal bowel sounds heard. Central nervous system: Alert and oriented. No focal neurological deficits. Extremities: No pretibial edema cyanosis or clubbing.  Data Reviewed: Basic Metabolic Panel: Recent Labs  Lab 12/01/17 1745 12/02/17 0415  NA 137 133*  K 3.6 4.5  CL 100 96*  CO2 30 27  GLUCOSE 43* 289*  BUN 10 14  CREATININE 0.72 0.71  CALCIUM  9.3 9.0  MG 2.1  --   PHOS 3.1  --    Liver Function Tests: Recent Labs  Lab 12/01/17 1745  AST 38  ALT 58*  ALKPHOS 58  BILITOT 0.6  PROT 8.1  ALBUMIN 3.9   No results for input(s): LIPASE, AMYLASE in the last 168 hours. No results for input(s): AMMONIA in the last 168 hours. CBC: Recent Labs  Lab 12/01/17 1745  WBC 13.4*  NEUTROABS 8.3*  HGB 13.2  HCT 41.9  MCV 94.6  PLT 279   Cardiac Enzymes: Recent Labs  Lab 12/01/17 1745 12/01/17 2300 12/02/17 0415  TROPONINI <0.03 <0.03 <0.03   CBG (last 3)  Recent Labs    12/02/17 0317 12/02/17 0533 12/02/17 0814  GLUCAP 297* 270* 250*   Recent Results (from the past 240 hour(s))  MRSA PCR Screening     Status: None   Collection Time: 12/01/17  9:00 PM  Result Value Ref Range Status   MRSA by PCR NEGATIVE NEGATIVE Final    Comment:        The GeneXpert MRSA Assay (FDA approved for NASAL specimens only), is one component of a comprehensive MRSA colonization surveillance program. It is not intended to diagnose MRSA infection nor to guide or monitor treatment for MRSA infections. Performed at [facility], [address], [place], [place] [address]    Studies: Dg Chest 2 View  Result Date: 12/01/2017 CLINICAL DATA:  Intermittent shortness of breath over the last week. Chest pain. EXAM: CHEST - 2 VIEW COMPARISON:  03/01/2015 FINDINGS: The heart is enlarged. There is aortic atherosclerosis. There is pulmonary venous hypertension with mild interstitial edema. May be a small amount of pleural fluid. No consolidation or collapse. IMPRESSION: Congestive heart failure pattern with interstitial edema. Electronically Signed   By: Nelson Chimes M.D.   On: 12/01/2017 18:41   Scheduled Meds: . amLODipine  10 mg Oral Daily  . aspirin EC  81 mg Oral q morning - 10a  . clopidogrel  75 mg Oral Daily  . furosemide  40 mg Oral BID  . heparin  5,000 Units Subcutaneous Q8H  . hydrALAZINE  25 mg Oral Q8H  . insulin aspart  0-20  Units Subcutaneous TID WC  . insulin aspart  0-5 Units Subcutaneous QHS  . insulin aspart  0-5 Units Subcutaneous QHS  . insulin aspart  10 Units Subcutaneous TID WC  . [START ON 12/03/2017] lisinopril  20 mg Oral Daily  . metoprolol tartrate  100 mg Oral BID  . montelukast  10 mg Oral Q breakfast  . potassium chloride SA  20 mEq Oral Daily  . rosuvastatin  40 mg Oral q1800  . triamcinolone cream  1 application Topical BID   Continuous Infusions:  Principal Problem:   Acute on chronic diastolic congestive heart failure (HCC) Active Problems:   Hypertension associated with diabetes ([place])   Hyperlipidemia associated with type 2 diabetes mellitus (HCC)   CAD (coronary artery disease)   PAD (peripheral artery disease) ([place])   Dizziness   Hypoglycemia  Critical Care Time spent: 13 mins  Elnathan Fulford Wynetta Emery, MD, FAAFP  [facility] Pager [phone]  If 7PM-7AM, please contact night-coverage [email or web address] Password [email or web address] 12/02/2017, 9:32 AM    LOS: 0 days

## 2017-12-02 NOTE — Evaluation (Signed)
Physical Therapy Evaluation Patient Details Name: Leah Ferrell MRN: [id number] DOB: [birth year] Today's Date: 12/02/2017   History of Present Illness  Patient is a 67 year old female admitted 12/01/2017 with diagnosis acute on chronic diastolic CHF. PMH: CAD, chronic  diastolic CHF, HTN, type 2 diabetes, history of arrhythmia, environmental and seasonal allergies, hyperlipidemia, hypertension, leukocytosis, morbid obesity, history of NSTEMI, peripheral artery disease who is coming to the emergency department with complaints of shortness of breath since about a week associated with lower extremity edema and fatigue.     Clinical Impression  Patient was able to ambulate 300 feet without assistive device nor assistance. No assistance needed for bed mobility nor transfers. Patient evaluated by Physical Therapy with no further acute PT needs identified. All education has been completed and the patient has no further questions.  See below for any follow-up Physical Therapy or equipment needs. PT is signing off. Thank you for this referral.     Follow Up Recommendations No PT follow up    Equipment Recommendations  None recommended by PT    Recommendations for Other Services       Precautions / Restrictions Precautions Precautions: None Restrictions Weight Bearing Restrictions: No      Mobility  Bed Mobility Overal bed mobility: Independent                Transfers Overall transfer level: Independent                  Ambulation/Gait Ambulation/Gait assistance: Independent Gait Distance (Feet): 300 Feet Assistive device: None Gait Pattern/deviations: Step-through pattern Gait velocity: slightly decreased    General Gait Details: did complain of mild shortness if breath but comment she had just weaned off O2 supplementation when transferred to medical floor.  Stairs            Wheelchair Mobility    Modified Rankin (Stroke Patients Only)        Balance Overall balance assessment: Independent                                           Pertinent Vitals/Pain Pain Assessment: No/denies pain    Home Living Family/patient expects to be discharged to:: Private residence Living Arrangements: Alone Available Help at Discharge: Family Type of Home: House Home Access: Level entry;Stairs to enter   Entrance Stairs-Number of Steps: alternate entry with 2 steps with no rail Home Layout: One level Home Equipment: [place] - 2 wheels;Cane - single point;Tub bench      Prior Function Level of Independence: Independent               Hand Dominance   Dominant Hand: Right    Extremity/Trunk Assessment   Upper Extremity Assessment Upper Extremity Assessment: Overall WFL for tasks assessed    Lower Extremity Assessment Lower Extremity Assessment: Overall WFL for tasks assessed    Cervical / Trunk Assessment Cervical / Trunk Assessment: Normal  Communication   Communication: No difficulties  Cognition Arousal/Alertness: Awake/alert Behavior During Therapy: WFL for tasks assessed/performed Overall Cognitive Status: Within Functional Limits for tasks assessed                                        General Comments      Exercises  Assessment/Plan    PT Assessment Patent does not need any further PT services  PT Problem List         PT Treatment Interventions      PT Goals (Current goals can be found in the Care Plan section)  Acute Rehab PT Goals Patient Stated Goal: go home, get better PT Goal Formulation: With patient Time For Goal Achievement: 12/09/17 Potential to Achieve Goals: Good    Frequency     Barriers to discharge        Co-evaluation               AM-PAC PT "6 Clicks" Daily Activity  Outcome Measure Difficulty turning over in bed (including adjusting bedclothes, sheets and blankets)?: None Difficulty moving from lying on back to sitting on  the side of the bed? : None Difficulty sitting down on and standing up from a chair with arms (e.g., wheelchair, bedside commode, etc,.)?: None Help needed moving to and from a bed to chair (including a wheelchair)?: None Help needed walking in hospital room?: None Help needed climbing 3-5 steps with a railing? : None 6 Click Score: 24    End of Session Equipment Utilized During Treatment: Gait belt Activity Tolerance: Patient tolerated treatment well Patient left: in bed;with family/visitor present;with call bell/phone within reach Nurse Communication: Mobility status PT Visit Diagnosis: Unsteadiness on feet (R26.81)    Time: 2072-1828 PT Time Calculation (min) (ACUTE ONLY): 29 min   Charges:   PT Evaluation $PT Eval Low Complexity: 1 Low PT Treatments $Therapeutic Activity: 8-22 mins        Floria Raveling. Hartnett-Rands, MS, PT Per [place] [phone] 12/02/2017, 2:09 PM

## 2017-12-02 NOTE — Progress Notes (Signed)
*  PRELIMINARY RESULTS* Echocardiogram 2D Echocardiogram has been performed.  Leavy Cella 12/02/2017, 1:04 PM

## 2017-12-02 NOTE — Care Management Note (Signed)
Case Management Note  Patient Details  Name: Leah Ferrell MRN: [id number] Date of Birth: Dec 29, 1950  Subjective/Objective:  CHF. From home. Very Independent. Has PCP- still drives. Sees Cardiologist regularly. Seen by PT, no recommendations.              Action/Plan: DC home with self care.   Expected Discharge Date:  12/03/17               Expected Discharge Plan:  Home/Self Care  In-House Referral:     Discharge planning Services  CM Consult  Post Acute Care Choice:  NA Choice offered to:  NA  DME Arranged:    DME Agency:     HH Arranged:    HH Agency:     Status of Service:  Completed, signed off  If discussed at [employer] of Stay Meetings, dates discussed:    Additional Comments:  Omar Gayden, Chauncey Reading, RN 12/02/2017, 2:19 PM

## 2017-12-02 NOTE — Telephone Encounter (Signed)
Aware.  Check with the lady, Leah Ferrell, who helps her get free medicine.

## 2017-12-02 NOTE — Progress Notes (Addendum)
Inpatient Diabetes Program Recommendations  AACE/ADA: New Consensus Statement on Inpatient Glycemic Control (2015)  Target Ranges:  Prepandial:   less than 140 mg/dL      Peak postprandial:   less than 180 mg/dL (1-2 hours)      Critically ill patients:  140 - 180 mg/dL   Results for Leah Ferrell, Leah Ferrell (MRN [id number]) as of 12/02/2017 08:29  Ref. Range 12/01/2017 17:51 12/01/2017 18:40 12/01/2017 21:45 12/01/2017 23:25  Glucose-Capillary Latest Ref Range: 70 - 99 mg/dL 37 (LL) 124 (H) 101 (H) 269 (H)   Results for Leah Ferrell, Leah Ferrell (MRN [id number]) as of 12/02/2017 08:29  Ref. Range 12/02/2017 00:05 12/02/2017 01:10 12/02/2017 02:04 12/02/2017 03:17 12/02/2017 05:33 12/02/2017 08:14  Glucose-Capillary Latest Ref Range: 70 - 99 mg/dL 310 (H) 303 (H) 276 (H)  8 units NOVOLOG  297 (H) 270 (H)  8 units NOVOLOG  250 (H)  5 units NOVOLOG    Results for Leah Ferrell, Leah Ferrell (MRN [id number]) as of 12/02/2017 08:29  Ref. Range 09/11/2017 12:06  HB A1C (BAYER DCA - WAIVED) Latest Ref Range: <7.0 % 8.3 (H)    Admit with: SOB/ CHF  History: DM, CHF  Home DM Meds: Humalog 75 units TID  Current Orders: Novolog Moderate Correction Scale/ SSI (0-15 units) TID AC + HS      PCP: Evelina Dun, FNP with [facility]--LAst seen 09/11/2017  Note patient received 125 mg Solumedrol X 1 dose last night at 6pm.  Was Hypoglycemic on arrival to the ED yesterday afternoon.     MD- If you note patient continues to have severely elevated CBGs, may consider starting weight based basal insulin dose in hospital:  Lantus 10 units Daily (0.1 units/kg dosing based on weight of 98 kg)  May also consider checking current A1c--Last one on file was 8.3% back in July     --Will follow patient during hospitalization--  Wyn Quaker RN, MSN, CDE Diabetes Coordinator Inpatient Glycemic Control Team Team Pager: [phone] (8a-5p)

## 2017-12-02 NOTE — Progress Notes (Signed)
Patient alert and oriented x4. No complaints of pain, shortness of breath, chest pain, dizziness, nausea or vomiting. Patient up out of bed and ambulatory independently with steady gait. Patient tolerated PO meds and diet well. Appetite good. Daughters and family in to visit. Emotional support provided. Patient O2 sat on room air remaining 93-94%. Nurse to nurse report called and given to [facility] on 300. Patient transferred to room 336. Patient and family made aware and ok with it.

## 2017-12-03 LAB — BASIC METABOLIC PANEL
ANION GAP: 9 (ref 5–15)
BUN: 22 mg/dL (ref 8–23)
CALCIUM: 9.1 mg/dL (ref 8.9–10.3)
CO2: 29 mmol/L (ref 22–32)
CREATININE: 0.76 mg/dL (ref 0.44–1.00)
Chloride: 99 mmol/L (ref 98–111)
GFR calc Af Amer: >60 mL/min (ref >60)
GFR calc non Af Amer: >60 mL/min (ref >60)
Glucose, Bld: 153 mg/dL — ABNORMAL HIGH (ref 70–99)
Potassium: 4.3 mmol/L (ref 3.5–5.1)
Sodium: 137 mmol/L (ref 135–145)

## 2017-12-03 LAB — GLUCOSE, CAPILLARY
Glucose-Capillary: 149 mg/dL — ABNORMAL HIGH (ref 70–99)
Glucose-Capillary: 135 mg/dL — ABNORMAL HIGH (ref 70–99)

## 2017-12-03 LAB — MAGNESIUM: Magnesium: 2.3 mg/dL (ref 1.7–2.4)

## 2017-12-03 LAB — HIV ANTIBODY (ROUTINE TESTING W REFLEX): HIV SCREEN 4TH GENERATION: NONREACTIVE

## 2017-12-03 MED ORDER — INSULIN LISPRO 200 UNIT/ML ~~LOC~~ SOPN: 60.0000 [IU] | PEN_INJECTOR | Freq: Three times a day (TID) | SUBCUTANEOUS | 2 refills | Status: DC

## 2017-12-03 NOTE — Plan of Care (Signed)
Care plan adequate for discharge.

## 2017-12-03 NOTE — Discharge Instructions (Signed)
Heart Failure Action Plan A heart failure action plan helps you understand what to do when you have symptoms of heart failure. Follow the plan that was created by you and your health care provider. Review your plan each time you visit your health care provider. Red zone These signs and symptoms mean you should get medical help right away:  You have trouble breathing when resting.  You have a dry cough that is getting worse.  You have swelling or pain in your legs or abdomen that is getting worse.  You suddenly gain more than 2-3 lb (0.9-1.4 kg) in a day, or more than 5 lb (2.3 kg) in one week. This amount may be more or less depending on your condition.  You have trouble staying awake or you feel confused.  You have chest pain.  You do not have an appetite.  You pass out.  If you experience any of these symptoms:  Call your local emergency services (911 in the [place] right away or seek help at the emergency department of the nearest hospital.  Yellow zone These signs and symptoms mean your condition may be getting worse and you should make some changes:  You have trouble breathing when you are active or you need to sleep with extra pillows.  You have swelling in your legs or abdomen.  You gain 2-3 lb (0.9-1.4 kg) in one day, or 5 lb (2.3 kg) in one week. This amount may be more or less depending on your condition.  You get tired easily.  You have trouble sleeping.  You have a dry cough.  If you experience any of these symptoms:  Contact your health care provider within the next day.  Your health care provider may adjust your medicines.  Green zone These signs mean you are doing well and can continue what you are doing:  You do not have shortness of breath.  You have very little swelling or no new swelling.  Your weight is stable (no gain or loss).  You have a normal activity level.  You do not have chest pain or any other new symptoms.  Follow these  instructions at home:  Take over-the-counter and prescription medicines only as told by your health care provider.  Weigh yourself daily. Your target weight is __________ lb (__________ kg). ? Call your health care provider if you gain more than __________ lb (__________ kg) in a day, or more than __________ lb (__________ kg) in one week.  Eat a heart-healthy diet. Work with a diet and nutrition specialist (dietitian) to create an eating plan that is best for you.  Keep all follow-up visits as told by your health care provider. This is important. Where to find more information:  American Heart Association: www.heart.org Summary  Follow the action plan that was created by you and your health care provider.  Get help right away if you have any symptoms in the Red zone. This information is not intended to replace advice given to you by your health care provider. Make sure you discuss any questions you have with your health care provider. Document Released: 03/15/2016 Document Revised: 03/15/2016 Document Reviewed: 03/15/2016 Elsevier Interactive Patient Education  2018 [place]. Heart Failure Eating Plan Heart failure, also called congestive heart failure, occurs when your heart does not pump blood well enough to meet your body's needs for oxygen-rich blood. Heart failure is a long-term (chronic) condition. Living with heart failure can be challenging. However, following your health care provider's instructions about  a healthy lifestyle and working with a diet and nutrition specialist (dietitian) to choose the right foods may help to improve your symptoms. What are tips for following this plan? General guidelines  Do not eat more than 2,300 mg of salt (sodium) a day. The amount of sodium that is recommended for you may be lower, depending on your condition.  Maintain a healthy body weight as directed. Ask your health care provider what a healthy weight is for you. ? Check your weight  every day. ? Work with your health care provider and dietitian to make a plan that is right for you to lose weight or maintain your current weight.  Limit how much fluid you drink. Ask your health care provider or dietitian how much fluid you can have each day.  Limit or avoid alcohol as told by your health care provider or dietitian. Reading food labels  Check food labels for the amount of sodium per serving. Choose foods that have less than 140 mg (milligrams) of sodium in each serving.  Check food labels for the number of calories per serving. This is important if you need to limit your daily calorie intake to lose weight.  Check food labels for the serving size. If you eat more than one serving, you will be eating more sodium and calories than what is listed on the label.  Look for foods that are labeled as "sodium-free," "very low sodium," or "low sodium." ? Foods labeled as "reduced sodium" or "lightly salted" may still have more sodium than what is recommended for you. Cooking  Avoid adding salt when cooking. Ask your health care provider or dietitian before using salt substitutes.  Season food with salt-free seasonings, spices, or herbs. Check the label of seasoning mixes to make sure they do not contain salt.  Cook with heart-healthy oils, such as olive, canola, soybean, or sunflower oil.  Do not fry foods. Cook foods using low-fat methods, such as baking, boiling, grilling, and broiling.  Limit unhealthy fats when cooking by: ? Removing the skin from poultry, such as chicken. ? Removing all visible fats from meats. ? Skimming the fat off from stews, soups, and gravies before serving them. Meal planning  Limit your intake of: ? Processed, canned, or pre-packaged foods. ? Foods that are high in trans fat, such as fried foods. ? Sweets, desserts, sugary drinks, and other foods with added sugar. ? Full-fat dairy products, such as whole milk.  Eat a balanced diet that  includes: ? 4-5 servings of fruit each day and 4-5 servings of vegetables each day. At each meal, try to fill half of your plate with fruits and vegetables. ? Up to 6-8 servings of whole grains each day. ? Up to 2 servings of lean meat, poultry, or fish each day. One serving of meat is equal to 3 oz. This is about the same size as a deck of cards. ? 2 servings of low-fat dairy each day. ? Heart-healthy fats. Healthy fats called omega-3 fatty acids are found in foods such as flaxseed and cold-water fish like sardines, salmon, and mackerel.  Aim to eat 25-35 g (grams) of fiber a day. Foods that are high in fiber include apples, broccoli, carrots, beans, peas, and whole grains.  Do not add salt or condiments that contain salt (such as soy sauce) to foods before eating.  When eating at a restaurant, ask that your food be prepared with less salt or no salt, if possible.  Try to eat 2  or more vegetarian meals each week.  Eat more home-cooked food and eat less restaurant, buffet, and fast food. Recommended foods The items listed may not be a complete list. Talk with your dietitian about what dietary choices are best for you. Grains Bread with less than 80 mg of sodium per slice. Whole-wheat pasta, quinoa, and brown rice. Oats and oatmeal. Barley. [place]. Grits and cream of wheat. Whole-grain and whole-wheat cold cereal. Vegetables All fresh vegetables. Vegetables that are frozen without sauce or added salt. Low-sodium or sodium-free canned vegetables. Fruits All fresh, frozen, and canned fruits. Dried fruits, such as raisins, prunes, and cranberries. Meats and other protein foods Lean cuts of meat. Skinless chicken and [place]. Fish with high omega-3 fatty acids, such as salmon, sardines, and other cold-water fishes. Eggs. Dried beans, peas, and edamame. Unsalted nuts and nut butters. Dairy Low-fat or nonfat (skim) milk and dried milk. Rice milk, soy milk, and almond milk. Low-fat or nonfat  yogurt. Small amounts of reduced-sodium block cheese. Low-sodium cottage cheese. Fats and oils Olive, canola, soybean, flaxseed, or sunflower oil. Avocado. Sweets and desserts Apple sauce. Granola bars. Sugar-free pudding and gelatin. Frozen fruit bars. Seasoning and other foods Fresh and dried herbs. Lemon or lime juice. Vinegar. Low-sodium ketchup. Salt-free marinades, salad dressings, sauces, and seasonings. Foods to avoid The items listed may not be a complete list. Talk with your dietitian about what dietary choices are best for you. Grains Bread with more than 80 mg of sodium per slice. Hot or cold cereal with more than 140 mg sodium per serving. Salted pretzels and crackers. Pre-packaged breadcrumbs. Bagels, croissants, and biscuits. Vegetables Canned vegetables. Frozen vegetables with sauce or seasonings. Creamed vegetables. [place] fries. Onion rings. Pickled vegetables and sauerkraut. Fruits Fruits that are dried with sodium-containing preservatives. Meats and other protein foods Ribs and chicken wings. Bacon, ham, pepperoni, bologna, salami, and packaged luncheon meats. Hot dogs, bratwurst, and sausage. Canned meat. Smoked meat and fish. Salted nuts and seeds. Dairy Whole milk, half-and-half, and cream. Buttermilk. Processed cheese, cheese spreads, and cheese curds. Regular cottage cheese. Feta cheese. Shredded cheese. String cheese. Fats and oils Butter, lard, shortening, ghee, and bacon fat. Canned and packaged gravies. Seasoning and other foods Onion salt, garlic salt, table salt, and sea salt. Marinades. Regular salad dressings. Relishes, pickles, and olives. Meat flavorings and tenderizers, and bouillon cubes. Horseradish, ketchup, and mustard. Worcestershire sauce. Teriyaki sauce, soy sauce (including reduced sodium). Hot sauce and Tabasco sauce. Steak sauce, fish sauce, oyster sauce, and cocktail sauce. Taco seasonings. Barbecue sauce. Tartar sauce. Summary  A heart failure  eating plan includes changes that limit your intake of sodium and unhealthy fat, and it may help you lose weight or maintain a healthy weight. Your health care provider may also recommend limiting how much fluid you drink.  Most people with heart failure should eat no more than 2,300 mg of salt (sodium) a day. The amount of sodium that is recommended for you may be lower, depending on your condition.  Contact your health care provider or dietitian before making any major changes to your diet. This information is not intended to replace advice given to you by your health care provider. Make sure you discuss any questions you have with your health care provider. Document Released: 06/20/2016 Document Revised: 06/20/2016 Document Reviewed: 06/20/2016 Elsevier Interactive Patient Education  2018 [place]. Heart Failure Heart failure means your heart has trouble pumping blood. This makes it hard for your body to work well. Heart failure  is usually a long-term (chronic) condition. You must take good care of yourself and follow your doctor's treatment plan. Follow these instructions at home:  Take your heart medicine as told by your doctor. ? Do not stop taking medicine unless your doctor tells you to. ? Do not skip any dose of medicine. ? Refill your medicines before they run out. ? Take other medicines only as told by your doctor or pharmacist.  Stay active if told by your doctor. The elderly and people with severe heart failure should talk with a doctor about physical activity.  Eat heart-healthy foods. Choose foods that are without trans fat and are low in saturated fat, cholesterol, and salt (sodium). This includes fresh or frozen fruits and vegetables, fish, lean meats, fat-free or low-fat dairy foods, whole grains, and high-fiber foods. Lentils and dried peas and beans (legumes) are also good choices.  Limit salt if told by your doctor.  Cook in a healthy way. Roast, grill, broil, bake,  poach, steam, or stir-fry foods.  Limit fluids as told by your doctor.  Weigh yourself every morning. Do this after you pee (urinate) and before you eat breakfast. Write down your weight to give to your doctor.  Take your blood pressure and write it down if your doctor tells you to.  Ask your doctor how to check your pulse. Check your pulse as told.  Lose weight if told by your doctor.  Stop smoking or chewing tobacco. Do not use gum or patches that help you quit without your doctor's approval.  Schedule and go to doctor visits as told.  Nonpregnant women should have no more than 1 drink a day. Men should have no more than 2 drinks a day. Talk to your doctor about drinking alcohol.  Stop illegal drug use.  Stay current with shots (immunizations).  Manage your health conditions as told by your doctor.  Learn to manage your stress.  Rest when you are tired.  If it is really hot outside: ? Avoid intense activities. ? Use air conditioning or fans, or get in a cooler place. ? Avoid caffeine and alcohol. ? Wear loose-fitting, lightweight, and light-colored clothing.  If it is really cold outside: ? Avoid intense activities. ? Layer your clothing. ? Wear mittens or gloves, a hat, and a scarf when going outside. ? Avoid alcohol.  Learn about heart failure and get support as needed.  Get help to maintain or improve your quality of life and your ability to care for yourself as needed. Contact a doctor if:  You gain weight quickly.  You are more short of breath than usual.  You cannot do your normal activities.  You tire easily.  You cough more than normal, especially with activity.  You have any or more puffiness (swelling) in areas such as your hands, feet, ankles, or belly (abdomen).  You cannot sleep because it is hard to breathe.  You feel like your heart is beating fast (palpitations).  You get dizzy or light-headed when you stand up. Get help right away  if:  You have trouble breathing.  There is a change in mental status, such as becoming less alert or not being able to focus.  You have chest pain or discomfort.  You faint. This information is not intended to replace advice given to you by your health care provider. Make sure you discuss any questions you have with your health care provider. Document Released: 11/13/2007 Document Revised: 07/12/2015 Document Reviewed: 03/22/2012 Elsevier Interactive Patient  Education  2017 [place]   Follow with Primary MD  Sharion Balloon, FNP  and other consultant's as instructed your Hospitalist MD  Please get a complete blood count and chemistry panel checked by your Primary MD at your next visit, and again as instructed by your Primary MD.  Get Medicines reviewed and adjusted: Please take all your medications with you for your next visit with your Primary MD  Laboratory/radiological data: Please request your Primary MD to go over all hospital tests and procedure/radiological results at the follow up, please ask your Primary MD to get all Hospital records sent to his/her office.  In some cases, they will be blood work, cultures and biopsy results pending at the time of your discharge. Please request that your primary care M.D. follows up on these results.  Also Note the following: If you experience worsening of your admission symptoms, develop shortness of breath, life threatening emergency, suicidal or homicidal thoughts you must seek medical attention immediately by calling 911 or calling your MD immediately  if symptoms less severe.  You must read complete instructions/literature along with all the possible adverse reactions/side effects for all the Medicines you take and that have been prescribed to you. Take any new Medicines after you have completely understood and accpet all the possible adverse reactions/side effects.   Do not drive when taking Pain medications or sleeping  medications (Benzodaizepines)  Do not take more than prescribed Pain, Sleep and Anxiety Medications. It is not advisable to combine anxiety,sleep and pain medications without talking with your primary care practitioner  Special Instructions: If you have smoked or chewed Tobacco  in the last 2 yrs please stop smoking, stop any regular Alcohol  and or any Recreational drug use.  Wear Seat belts while driving.  Please note: You were cared for by a hospitalist during your hospital stay. Once you are discharged, your primary care physician will handle any further medical issues. Please note that NO REFILLS for any discharge medications will be authorized once you are discharged, as it is imperative that you return to your primary care physician (or establish a relationship with a primary care physician if you do not have one) for your post hospital discharge needs so that they can reassess your need for medications and monitor your lab values.

## 2017-12-03 NOTE — Discharge Summary (Signed)
Physician Discharge Summary  Leah Ferrell [id number] DOB: November 07, 1950 DOA: 12/01/2017  PCP: Sharion Balloon, FNP Cardiologists: Arida/Branch  Admit date: 12/01/2017 Discharge date: 12/03/2017  Admitted From: Home  Disposition: Home   Recommendations for Outpatient Follow-up:  1. Follow up with PCP in 2 weeks 2. Follow up with cardiology in 1-2 weeks for recheck of CHF  Discharge Condition: STABLE   CODE STATUS: FULL    Brief Hospitalization Summary: Please see all hospital notes, images, labs for full details of the hospitalization.  HPI: Leah Ferrell is a 67 y.o. female with medical history significant of CAD, chronic diastolic CHF, type 2 diabetes, history of arrhythmia, environmental and seasonal allergies, hyperlipidemia, hypertension, leukocytosis, morbid obesity, CAD, history of NSTEMI, peripheral vascular disease who is coming to the emergency department with complaints of shortness of breath since about a week associated with lower extremity edema and fatigue.  She also reports having pressure-like chest pain, radiated, as a numbness sensation, to her left arm the previous night, but denies further chest pain today.  No fever, but complains of chills and fatigue.  No sick contacts or travel history.  No rhinorrhea, mild sore throat.  She had mild wheezing on initial EDP exam.  She complains of frequent positional dizziness.  Occasional palpitations.  She denies abdominal pain, nausea, emesis, diarrhea, constipation, melena or hematochezia.  No dysuria, frequency or hematuria.  No polyuria, polydipsia, polyphagia or blurred vision.  Denies skin pruritus.  ED Course: Initial vital signs temperature 98.2 F, pulse 65, respirations 20, blood pressure 137/99 mmHg and O2 sat 97% on room air.  She was given supplemental oxygen, furosemide 60 mg IVP x1 and dextrose 12.5 g IVP after her CBG was 37 and serum glucose was 43 mg/dL.  Her troponin was normal.  BNP mildly elevated at  157.0 pg/mL.  Her chest radiograph showed cardiomegaly with interstitial edema.  Please see images and full radiology report for further detail.  Brief Admission Hx: Leah Mabin Fleshmanis a 67 y.o.femalewith medical history significant of CAD, chronic diastolic CHF, type 2 diabetes, history of arrhythmia, environmental and seasonal allergies, hyperlipidemia, hypertension, leukocytosis, morbid obesity, CAD, history of NSTEMI, peripheral vascular disease who is coming to the emergency department with complaints of shortness of breath since about a week associated with lower extremity edema and fatigue.  She was admitted with pulmonary edema and being treated for acute CHF exacerbation.   MDM/Assessment & Plan:   1. Acute on chronic diastolic CHF exacerbation - Pt feeling much better after diuresis.  She has no SOB and able to ambulate well.  No chest pain. Echo below significant for grade 2 DD.   The patient was diuresed with IV furosemide.  Monitored intake and output and weights.  Monitored electrolytes. Discharge home today with outpatient follow up.   2. Hypoglycemia- upon review, patient is taking high doses of prandial insulin 75 units 3 times daily which likely precipitated her hypoglycemia.  We have reduced her doses to 60 units and recommended that she see an endocrinologist.  3. Hyperlipidemia-the patient is being maintained on Crestor 40 mg daily. 4. Coronary artery disease-she is followed by the outpatient cardiology heart care team and she is maintained on antiplatelets, metoprolol and Crestor. 5. Essential hypertension- resume her home blood pressure medications and have her follow up with her cardiology team and PCP.  6. Dizziness-patient reports frequent positional dizziness likely this is related to her blood pressure and being on so many antihypertensives.  She is getting  an echocardiogram in addition to carotid Dopplers.  We will will follow up on those results. 7. PVD - Pt had some  carotid artery atherosclerosis but it was not hemodynamically significant according to the CTA of neck.   Echocardiogram 12/02/17 Study Conclusions  - Left ventricle: The cavity size was normal. Wall thickness was  increased in a pattern of moderate LVH. Systolic function was   vigorous. The estimated ejection fraction was in the range of 65%  to 70%. Wall motion was normal; there were no regional wall   motion abnormalities. Features are consistent with a pseudonormal  left ventricular filling pattern, with concomitant abnormal   relaxation and increased filling pressure (grade 2 diastolic dysfunction). Doppler parameters are consistent with high   ventricular filling pressure. - Aortic valve: Mildly calcified annulus. Trileaflet. - Mitral valve: Mildly calcified annulus. There was mild  regurgitation. - Left atrium: The atrium was mildly dilated.  DVT prophylaxis: Heparin Code Status: Full Family Communication: daughter at bedside, I spoke with daughter and patient about the importance of diet and follow up and blood glucose testing. They verbalized understanding.  Disposition Plan: home   Discharge Diagnoses:  Principal Problem:   Acute on chronic diastolic congestive heart failure (HCC) Active Problems:   Hypertension associated with diabetes ([place])   Hyperlipidemia associated with type 2 diabetes mellitus (HCC)   CAD (coronary artery disease)   PAD (peripheral artery disease) (HCC)   Dizziness   Hypoglycemia   Acute on chronic diastolic CHF (congestive heart failure) ([place])  Discharge Instructions: Discharge Instructions    (HEART FAILURE PATIENTS) Call MD:  Anytime you have any of the following symptoms: 1) 3 pound weight gain in 24 hours or 5 pounds in 1 week 2) shortness of breath, with or without a dry hacking cough 3) swelling in the hands, feet or stomach 4) if you have to sleep on extra pillows at night in order to breathe.   Complete by:  As directed    Call MD for:   difficulty breathing, headache or visual disturbances   Complete by:  As directed    Call MD for:  extreme fatigue   Complete by:  As directed    Call MD for:  persistant dizziness or light-headedness   Complete by:  As directed    Diet - low sodium heart healthy   Complete by:  As directed    Increase activity slowly   Complete by:  As directed      Allergies as of 12/03/2017      Reactions   Tape Rash      Medication List    TAKE these medications   albuterol 108 (90 Base) MCG/ACT inhaler Commonly known as:  PROVENTIL HFA;VENTOLIN HFA INHALE ONE TO TWO PUFFS BY  MOUTH EVERY 6 HOURS AS  NEEDED FOR WHEEZING OR FOR  SHORTNESS OF BREATH What changed:    how much to take  how to take this  when to take this  reasons to take this  additional instructions   amLODipine 10 MG tablet Commonly known as:  NORVASC Take 1 tablet (10 mg total) by mouth daily.   aspirin EC 81 MG tablet Take 81 mg by mouth every morning.   CALTRATE 600+D PLUS MINERALS 600-800 MG-UNIT Tabs Take 1 tablet by mouth 3 (three) times daily.   clopidogrel 75 MG tablet Commonly known as:  PLAVIX TAKE 1 TABLET BY MOUTH  DAILY   fish oil-omega-3 fatty acids 1000 MG capsule Take 1 g  by mouth every morning.   furosemide 40 MG tablet Commonly known as:  LASIX TAKE 1 TABLET DAILY MAY TAKE ADDITIONAL TABLET AS NEEDED FOR SWELLING What changed:    how much to take  how to take this  when to take this  additional instructions   hydrALAZINE 25 MG tablet Commonly known as:  APRESOLINE Take 1 tablet (25 mg total) by mouth 3 (three) times daily.   hydroxypropyl methylcellulose / hypromellose 2.5 % ophthalmic solution Commonly known as:  ISOPTO TEARS / GONIOVISC Place 1 drop into both eyes 3 (three) times daily as needed (for dry eyes).   Insulin Lispro 200 UNIT/ML Sopn Inject 60 Units into the skin 3 (three) times daily before meals. What changed:  See the new instructions.   lisinopril 20 MG  tablet Commonly known as:  PRINIVIL,ZESTRIL Take 2 tablets (40 mg total) by mouth daily. What changed:    how much to take  when to take this   metoprolol tartrate 100 MG tablet Commonly known as:  LOPRESSOR Take 1 tablet (100 mg total) by mouth 2 (two) times daily.   montelukast 10 MG tablet Commonly known as:  SINGULAIR Take 1 tablet (10 mg total) by mouth daily with breakfast.   nitroGLYCERIN 0.4 MG SL tablet Commonly known as:  NITROSTAT Place 1 tablet (0.4 mg total) under the tongue every 5 (five) minutes x 3 doses as needed for chest pain (up to 3 doses).   potassium chloride SA 20 MEQ tablet Commonly known as:  K-DUR,KLOR-CON Take 1 tablet (20 mEq total) by mouth daily. What changed:  how much to take   RELION PEN NEEDLES 29G X 12MM Misc Generic drug:  Insulin Pen Needle USE 1 NEEDLE TO INJECT INSULIN SUBCUTANEOUSLY 3 TIMES DAILY What changed:  Another medication with the same name was removed. Continue taking this medication, and follow the directions you see here.   rosuvastatin 40 MG tablet Commonly known as:  CRESTOR Take 1 tablet (40 mg total) by mouth daily.   triamcinolone cream 0.1 % Commonly known as:  KENALOG Apply 1 application topically 2 (two) times daily.   vitamin C 1000 MG tablet Take 1,000 mg by mouth 2 (two) times daily.      Follow-up Information    Wellington Hampshire, MD. Schedule an appointment as soon as possible for a visit in 1 week(s).   Specialty:  Cardiology Why:  Hospital Follow Up  Contact information: [address] [place] [address] [phone]        Sharion Balloon, FNP. Schedule an appointment as soon as possible for a visit in 2 week(s).   Specialty:  Family Medicine Why:  [facility] Follow Up  Contact information: [place] [address] [phone]          Allergies  Allergen Reactions  . Tape Rash   Allergies as of 12/03/2017      Reactions   Tape Rash       Medication List    TAKE these medications   albuterol 108 (90 Base) MCG/ACT inhaler Commonly known as:  PROVENTIL HFA;VENTOLIN HFA INHALE ONE TO TWO PUFFS BY  MOUTH EVERY 6 HOURS AS  NEEDED FOR WHEEZING OR FOR  SHORTNESS OF BREATH What changed:    how much to take  how to take this  when to take this  reasons to take this  additional instructions   amLODipine 10 MG tablet Commonly known as:  NORVASC Take 1 tablet (  10 mg total) by mouth daily.   aspirin EC 81 MG tablet Take 81 mg by mouth every morning.   CALTRATE 600+D PLUS MINERALS 600-800 MG-UNIT Tabs Take 1 tablet by mouth 3 (three) times daily.   clopidogrel 75 MG tablet Commonly known as:  PLAVIX TAKE 1 TABLET BY MOUTH  DAILY   fish oil-omega-3 fatty acids 1000 MG capsule Take 1 g by mouth every morning.   furosemide 40 MG tablet Commonly known as:  LASIX TAKE 1 TABLET DAILY MAY TAKE ADDITIONAL TABLET AS NEEDED FOR SWELLING What changed:    how much to take  how to take this  when to take this  additional instructions   hydrALAZINE 25 MG tablet Commonly known as:  APRESOLINE Take 1 tablet (25 mg total) by mouth 3 (three) times daily.   hydroxypropyl methylcellulose / hypromellose 2.5 % ophthalmic solution Commonly known as:  ISOPTO TEARS / GONIOVISC Place 1 drop into both eyes 3 (three) times daily as needed (for dry eyes).   Insulin Lispro 200 UNIT/ML Sopn Inject 60 Units into the skin 3 (three) times daily before meals. What changed:  See the new instructions.   lisinopril 20 MG tablet Commonly known as:  PRINIVIL,ZESTRIL Take 2 tablets (40 mg total) by mouth daily. What changed:    how much to take  when to take this   metoprolol tartrate 100 MG tablet Commonly known as:  LOPRESSOR Take 1 tablet (100 mg total) by mouth 2 (two) times daily.   montelukast 10 MG tablet Commonly known as:  SINGULAIR Take 1 tablet (10 mg total) by mouth daily with breakfast.   nitroGLYCERIN 0.4 MG SL  tablet Commonly known as:  NITROSTAT Place 1 tablet (0.4 mg total) under the tongue every 5 (five) minutes x 3 doses as needed for chest pain (up to 3 doses).   potassium chloride SA 20 MEQ tablet Commonly known as:  K-DUR,KLOR-CON Take 1 tablet (20 mEq total) by mouth daily. What changed:  how much to take   RELION PEN NEEDLES 29G X 12MM Misc Generic drug:  Insulin Pen Needle USE 1 NEEDLE TO INJECT INSULIN SUBCUTANEOUSLY 3 TIMES DAILY What changed:  Another medication with the same name was removed. Continue taking this medication, and follow the directions you see here.   rosuvastatin 40 MG tablet Commonly known as:  CRESTOR Take 1 tablet (40 mg total) by mouth daily.   triamcinolone cream 0.1 % Commonly known as:  KENALOG Apply 1 application topically 2 (two) times daily.   vitamin C 1000 MG tablet Take 1,000 mg by mouth 2 (two) times daily.      Procedures/Studies: Dg Chest 2 View  Result Date: 12/01/2017 CLINICAL DATA:  Intermittent shortness of breath over the last week. Chest pain. EXAM: CHEST - 2 VIEW COMPARISON:  03/01/2015 FINDINGS: The heart is enlarged. There is aortic atherosclerosis. There is pulmonary venous hypertension with mild interstitial edema. May be a small amount of pleural fluid. No consolidation or collapse. IMPRESSION: Congestive heart failure pattern with interstitial edema. Electronically Signed   By: Nelson Chimes M.D.   On: 12/01/2017 18:41   Ct Angio Neck W Or Wo Contrast  Result Date: 12/02/2017 CLINICAL DATA:  Dizziness and shortness of breath. Abnormal vascular ultrasound EXAM: CT ANGIOGRAPHY NECK TECHNIQUE: Multidetector CT imaging of the neck was performed using the standard protocol during bolus administration of intravenous contrast. Multiplanar CT image reconstructions and MIPs were obtained to evaluate the vascular anatomy. Carotid stenosis measurements (when applicable)  are obtained utilizing NASCET criteria, using the distal internal  carotid diameter as the denominator. CONTRAST:  68mL ISOVUE-370 IOPAMIDOL (ISOVUE-370) INJECTION 76% COMPARISON:  Vascular ultrasound 12/02/2017 Head CT 03/01/2015 FINDINGS: Aortic arch: There is moderate calcific atherosclerosis of the aortic arch. There is no aneurysm, dissection or hemodynamically significant stenosis of the visualized ascending aorta and aortic arch. Conventional 3 vessel aortic branching pattern. The visualized proximal subclavian arteries are widely patent. Right carotid system: --Common carotid artery: Widely patent origin without common carotid artery dissection or aneurysm. --Internal carotid artery: No dissection, occlusion or aneurysm. Mild atherosclerotic calcification at the carotid bifurcation without hemodynamically significant stenosis. --External carotid artery: No acute abnormality. Left carotid system: --Common carotid artery: Widely patent origin without common carotid artery dissection or aneurysm. --Internal carotid artery:No dissection, occlusion or aneurysm. No hemodynamically significant stenosis. --External carotid artery: No acute abnormality. Vertebral arteries: Codominant configuration. There is calcification at both vertebral artery origins without definite stenosis. There is no dissection or stenosis to the craniocervical junction. Skeleton: There is no bony spinal canal stenosis. No lytic or blastic lesion. Other neck: Normal pharynx, larynx and major salivary glands. No cervical lymphadenopathy. Unremarkable thyroid gland. Upper chest: No pneumothorax or pleural effusion. No nodules or masses. Review of the MIP images confirms the above findings IMPRESSION: 1. Bilateral carotid bifurcation atherosclerotic calcification without hemodynamically significant stenosis. 2. No hemodynamically stenosis or acute vascular abnormality of the neck. Electronically Signed   By: Ulyses Jarred M.D.   On: 12/02/2017 20:52   US Carotid Bilateral  Result Date: 12/02/2017 CLINICAL  DATA:  CAD. History of hypertension, hyperlipidemia and diabetes. Former smoker. EXAM: BILATERAL CAROTID DUPLEX ULTRASOUND TECHNIQUE: Pearline Cables scale imaging, color Doppler and duplex ultrasound were performed of bilateral carotid and vertebral arteries in the neck. COMPARISON:  None. FINDINGS: Criteria: Quantification of carotid stenosis is based on velocity parameters that correlate the residual internal carotid diameter with NASCET-based stenosis levels, using the diameter of the distal internal carotid lumen as the denominator for stenosis measurement. The following velocity measurements were obtained: RIGHT ICA:  113/23 cm/sec CCA:  36/6 cm/sec SYSTOLIC ICA/CCA RATIO:  1.3 ECA:  159 cm/sec LEFT ICA:  120/21 cm/sec CCA:  29/4 cm/sec SYSTOLIC ICA/CCA RATIO:  1.4 ECA:  116 cm/sec RIGHT CAROTID ARTERY: There is a minimal amount of mixed echogenic plaque within the mid (image 7) and distal (image 11) aspects of the right common carotid artery. There is a moderate amount of mixed echogenic plaque within the right carotid bulb (image 16), extending to involve the origin and proximal aspects the right internal carotid artery (image 24), not resulting in elevated peak systolic velocities within the interrogated course the right internal carotid artery to suggest a hemodynamically significant stenosis. RIGHT VERTEBRAL ARTERY:  Antegrade flow LEFT CAROTID ARTERY: There is a minimal amount echogenic plaque seen throughout the left common carotid artery (images 37, 41 and 45). There is a moderate to large amount of echogenic plaque within the left carotid bulb (image 50), extending to involve the origin and proximal aspect the left internal carotid artery (image 58), resulting in borderline elevated peak systolic velocity within the mid aspect of the left internal carotid artery. Greatest acquired peak systolic velocity within mid left ICA measures 120 centimeters/second (image 63). LEFT VERTEBRAL ARTERY:  Antegrade Flow  IMPRESSION: 1. Moderate to large amount of left-sided atherosclerotic plaque results in borderline elevated peak systolic velocities within the left internal carotid artery which approach the 50% luminal narrowing range. Further evaluation with CTA could  be performed as clinically indicated. 2. Moderate amount of right-sided atherosclerotic plaque, not definitely resulting in a hemodynamically significant stenosis. Electronically Signed   By: Sandi Mariscal M.D.   On: 12/02/2017 12:21      Subjective: Pt says that she is feeling a lot better today. She is sitting up eating and drinking, no distress, cooperative.   Discharge Exam: Vitals:   12/02/17 2116 12/03/17 0535  BP: (!) 165/55 (!) 132/52  Pulse: 67 (!) 54  Resp: 18 14  Temp: 98.2 F (36.8 C) 98.2 F (36.8 C)  SpO2: 95% 97%   Vitals:   12/02/17 1943 12/02/17 2116 12/03/17 0500 12/03/17 0535  BP:  (!) 165/55  (!) 132/52  Pulse: 73 67  (!) 54  Resp:  18  14  Temp: 98.5 F (36.9 C) 98.2 F (36.8 C)  98.2 F (36.8 C)  TempSrc: Oral Oral  Oral  SpO2: 96% 95%  97%  Weight:   98.1 kg   Height:       General exam: Awake, alert, no apparent distress, cooperative and pleasant. Respiratory system: Rare bibasilar crackles no increased work of breathing. Cardiovascular system: S1 & S2 heard, holosystolic murmur heard. Gastrointestinal system: Abdomen is nondistended, soft and nontender. Normal bowel sounds heard. Central nervous system: Alert and oriented. No focal neurological deficits. Extremities: No pretibial edema cyanosis or clubbing.   The results of significant diagnostics from this hospitalization (including imaging, microbiology, ancillary and laboratory) are listed below for reference.     Microbiology: Recent Results (from the past 240 hour(s))  MRSA PCR Screening     Status: None   Collection Time: 12/01/17  9:00 PM  Result Value Ref Range Status   MRSA by PCR NEGATIVE NEGATIVE Final    Comment:        The GeneXpert  MRSA Assay (FDA approved for NASAL specimens only), is one component of a comprehensive MRSA colonization surveillance program. It is not intended to diagnose MRSA infection nor to guide or monitor treatment for MRSA infections. Performed at [facility], [address] [place], [place] [address]      Labs: BNP (last 3 results) Recent Labs    12/01/17 1745  BNP 789.3*   Basic Metabolic Panel: Recent Labs  Lab 12/01/17 1745 12/02/17 0415 12/03/17 0606  NA 137 133* 137  K 3.6 4.5 4.3  CL 100 96* 99  CO2 30 27 29   GLUCOSE 43* 289* 153*  BUN 10 14 22   CREATININE 0.72 0.71 0.76  CALCIUM 9.3 9.0 9.1  MG 2.1  --  2.3  PHOS 3.1  --   --    Liver Function Tests: Recent Labs  Lab 12/01/17 1745  AST 38  ALT 58*  ALKPHOS 58  BILITOT 0.6  PROT 8.1  ALBUMIN 3.9   No results for input(s): LIPASE, AMYLASE in the last 168 hours. No results for input(s): AMMONIA in the last 168 hours. CBC: Recent Labs  Lab 12/01/17 1745  WBC 13.4*  NEUTROABS 8.3*  HGB 13.2  HCT 41.9  MCV 94.6  PLT 279   Cardiac Enzymes: Recent Labs  Lab 12/01/17 1745 12/01/17 2300 12/02/17 0415  TROPONINI <0.03 <0.03 <0.03   BNP: Invalid input(s): POCBNP CBG: Recent Labs  Lab 12/02/17 0814 12/02/17 1116 12/02/17 1647 12/03/17 0307 12/03/17 0726  GLUCAP 250* 372* 339* 149* 135*   D-Dimer No results for input(s): DDIMER in the last 72 hours. Hgb A1c Recent Labs    12/02/17 0415  HGBA1C 7.7*  Lipid Profile No results for input(s): CHOL, HDL, LDLCALC, TRIG, CHOLHDL, LDLDIRECT in the last 72 hours. Thyroid function studies No results for input(s): TSH, T4TOTAL, T3FREE, THYROIDAB in the last 72 hours.  Invalid input(s): FREET3 Anemia work up No results for input(s): VITAMINB12, FOLATE, FERRITIN, TIBC, IRON, RETICCTPCT in the last 72 hours. Urinalysis    Component Value Date/Time   COLORURINE YELLOW 10/27/2013 1341   APPEARANCEUR Clear 04/24/2016 1156   LABSPEC 1.009  10/27/2013 1341   PHURINE 6.5 10/27/2013 1341   GLUCOSEU Negative 04/24/2016 1156   HGBUR NEGATIVE 10/27/2013 1341   BILIRUBINUR Negative 04/24/2016 1156   KETONESUR NEGATIVE 10/27/2013 1341   PROTEINUR Negative 04/24/2016 1156   PROTEINUR NEGATIVE 10/27/2013 1341   UROBILINOGEN 0.2 10/27/2013 1341   NITRITE Negative 04/24/2016 1156   NITRITE NEGATIVE 10/27/2013 1341   LEUKOCYTESUR Negative 04/24/2016 1156   Sepsis Labs Invalid input(s): PROCALCITONIN,  WBC,  LACTICIDVEN Microbiology Recent Results (from the past 240 hour(s))  MRSA PCR Screening     Status: None   Collection Time: 12/01/17  9:00 PM  Result Value Ref Range Status   MRSA by PCR NEGATIVE NEGATIVE Final    Comment:        The GeneXpert MRSA Assay (FDA approved for NASAL specimens only), is one component of a comprehensive MRSA colonization surveillance program. It is not intended to diagnose MRSA infection nor to guide or monitor treatment for MRSA infections. Performed at [facility], [address] [place], [place] [address]    Time coordinating discharge: 33 minutes   SIGNED:  Irwin Brakeman, MD  [facility] 12/03/2017, 10:44 AM Pager [phone]  If 7PM-7AM, please contact night-coverage [email or web address] Password [email or web address]

## 2017-12-14 ENCOUNTER — Ambulatory Visit: Payer: Medicare Other | Admitting: Family

## 2017-12-18 ENCOUNTER — Encounter: Payer: Self-pay | Admitting: Family

## 2017-12-18 ENCOUNTER — Ambulatory Visit (INDEPENDENT_AMBULATORY_CARE_PROVIDER_SITE_OTHER): Payer: Medicare Other | Admitting: Family

## 2017-12-18 VITALS — BP 134/43 | HR 49 | Temp 97.2°F | Ht 62.5 in | Wt 212.0 lb

## 2017-12-18 DIAGNOSIS — I2510 Atherosclerotic heart disease of native coronary artery without angina pectoris: Secondary | ICD-10-CM

## 2017-12-18 DIAGNOSIS — E1151 Type 2 diabetes mellitus with diabetic peripheral angiopathy without gangrene: Secondary | ICD-10-CM | POA: Diagnosis not present

## 2017-12-18 DIAGNOSIS — E1159 Type 2 diabetes mellitus with other circulatory complications: Secondary | ICD-10-CM

## 2017-12-18 DIAGNOSIS — E1169 Type 2 diabetes mellitus with other specified complication: Secondary | ICD-10-CM | POA: Diagnosis not present

## 2017-12-18 DIAGNOSIS — E785 Hyperlipidemia, unspecified: Secondary | ICD-10-CM | POA: Diagnosis not present

## 2017-12-18 DIAGNOSIS — I10 Essential (primary) hypertension: Secondary | ICD-10-CM

## 2017-12-18 DIAGNOSIS — Z09 Encounter for follow-up examination after completed treatment for conditions other than malignant neoplasm: Secondary | ICD-10-CM

## 2017-12-18 DIAGNOSIS — I739 Peripheral vascular disease, unspecified: Secondary | ICD-10-CM

## 2017-12-18 DIAGNOSIS — I5032 Chronic diastolic (congestive) heart failure: Secondary | ICD-10-CM

## 2017-12-18 DIAGNOSIS — I152 Hypertension secondary to endocrine disorders: Secondary | ICD-10-CM

## 2017-12-18 LAB — BAYER DCA HB A1C WAIVED: HB A1C (BAYER DCA - WAIVED): 7.4 % — ABNORMAL HIGH (ref <7.0)

## 2017-12-18 MED ORDER — MONTELUKAST SODIUM 10 MG PO TABS: 10.0000 mg | ORAL_TABLET | Freq: Every day | ORAL | 1 refills | Status: DC

## 2017-12-18 MED ORDER — MUPIROCIN 2 % EX OINT: 1.0000 "application " | TOPICAL_OINTMENT | Freq: Two times a day (BID) | CUTANEOUS | 0 refills | Status: DC

## 2017-12-18 NOTE — Progress Notes (Signed)
Subjective:    Patient ID: Leah Ferrell, female    DOB: 06/14/1950, 67 y.o.   MRN: [id number]  Chief Complaint  Patient presents with  . Hospitalization Follow-up   PT presents to the office today for hospital follow up. She went to the ED on 12/01/17 for SOB and Pulmonary edema and CHF exacerbation. She was diuresis and SOB improved and discharged on 12/03/17.   She had episodes of hypoglycemia and was recommended to go to Endocrinologists. We has done several referrals and she has refused. She refuses long acting insulin or changing diabetic medications in the past, because she can not "afford other medications or to see another provider".   She has follow up with Cardiologists and Vascular. She has CAD and PVD.  Diabetes  She presents for her follow-up diabetic visit. She has type 2 diabetes mellitus. Her disease course has been worsening. Hypoglycemia symptoms include nervousness/anxiousness. Pertinent negatives for hypoglycemia include no confusion. Associated symptoms include fatigue and foot paresthesias. Pertinent negatives for diabetes include no chest pain and no visual change. There are no hypoglycemic complications. Symptoms are stable. Diabetic complications include heart disease, peripheral neuropathy and PVD. Risk factors for coronary artery disease include dyslipidemia, diabetes mellitus, obesity, hypertension and sedentary lifestyle. Her overall blood glucose range is 180-200 mg/dl.  Congestive Heart Failure  Presents for follow-up visit. Associated symptoms include fatigue. Pertinent negatives include no chest pain, chest pressure, edema, shortness of breath or unexpected weight change. The symptoms have been improving.  Hypertension  This is a chronic problem. The current episode started more than 1 year ago. The problem has been resolved since onset. The problem is controlled. Associated symptoms include malaise/fatigue and peripheral edema. Pertinent negatives include no  chest pain or shortness of breath. Risk factors for coronary artery disease include dyslipidemia, family history and sedentary lifestyle. Hypertensive end-organ damage includes PVD.  Hyperlipidemia  This is a chronic problem. The current episode started more than 1 year ago. The problem is uncontrolled. Recent lipid tests were reviewed and are high. Exacerbating diseases include obesity. Pertinent negatives include no chest pain or shortness of breath. Current antihyperlipidemic treatment includes statins. The current treatment provides moderate improvement of lipids. Risk factors for coronary artery disease include dyslipidemia, diabetes mellitus, hypertension, obesity, a sedentary lifestyle and post-menopausal.    [facility] notes reviewed  Review of Systems  Constitutional: Positive for fatigue and malaise/fatigue. Negative for unexpected weight change.  Respiratory: Negative for shortness of breath.   Cardiovascular: Negative for chest pain.  Psychiatric/Behavioral: Negative for confusion. The patient is nervous/anxious.   All other systems reviewed and are negative.  Family History  Problem Relation Age of Onset  . Diabetes Mother   . Hyperlipidemia Mother   . Hypertension Mother   . Peripheral vascular disease Mother   . Diabetes Maternal Grandmother   . Heart attack Maternal Grandfather   . Heart disease Brother   . Other Sister        drowned  . Diabetes Brother   . Alcohol abuse Brother   . Other Sister        drowned  . Diabetes Daughter        borderline  . Hypertension Daughter   . Diabetes Son   . Hypertension Son   . Hyperlipidemia Son     Social History   Socioeconomic History  . Marital status: Widowed    Spouse name: Not on file  . Number of children: Not on file  . Years of  education: Not on file  . Highest education level: Not on file  Occupational History  . Not on file  Social Needs  . Financial resource strain: Not on file  . Food insecurity:     Worry: Not on file    Inability: Not on file  . Transportation needs:    Medical: Not on file    Non-medical: Not on file  Tobacco Use  . Smoking status: Former Smoker    Packs/day: 1.00    Years: 36.00    Pack years: 36.00    Types: Cigarettes    Start date: 04/21/1964    Last attempt to quit: 02/17/2001    Years since quitting: 16.8  . Smokeless tobacco: Never Used  Substance and Sexual Activity  . Alcohol use: No    Alcohol/week: 0.0 standard drinks  . Drug use: No  . Sexual activity: Never    Birth control/protection: Post-menopausal  Lifestyle  . Physical activity:    Days per week: Not on file    Minutes per session: Not on file  . Stress: Not on file  Relationships  . Social connections:    Talks on phone: Not on file    Gets together: Not on file    Attends religious service: Not on file    Active member of club or organization: Not on file    Attends meetings of clubs or organizations: Not on file    Relationship status: Not on file  Other Topics Concern  . Not on file  Social History Narrative  . Not on file       Objective:   Physical Exam  Constitutional: She is oriented to person, place, and time. She appears well-developed and well-nourished. No distress.  HENT:  Head: Normocephalic and atraumatic.  Right Ear: External ear normal.  Left Ear: External ear normal.  Mouth/Throat: Oropharynx is clear and moist.  Eyes: Pupils are equal, round, and reactive to light.  Neck: Normal range of motion. Neck supple. No thyromegaly present.  Cardiovascular: Normal rate, regular rhythm and intact distal pulses.  Murmur heard. Pulmonary/Chest: Effort normal. No respiratory distress. She has decreased breath sounds. She has no wheezes.  Abdominal: Soft. Bowel sounds are normal. She exhibits no distension. There is no tenderness.  Musculoskeletal: Normal range of motion. She exhibits edema (trace BLE). She exhibits no tenderness.  Neurological: She is alert and  oriented to person, place, and time. She has normal reflexes. No cranial nerve deficit.  Skin: Skin is warm and dry.  Psychiatric: She has a normal mood and affect. Her behavior is normal. Judgment and thought content normal.  Vitals reviewed.     BP (!) 134/43   Pulse (!) 49   Temp (!) 97.2 F (36.2 C) (Oral)   Ht 5' 2.5" (1.588 m)   Wt 212 lb (96.2 kg)   BMI 38.16 kg/m      Assessment & Plan:  ALFIE ALDERFER comes in today with chief complaint of Hospitalization Follow-up   Diagnosis and orders addressed:  1. Hypertension associated with diabetes ([place]) - CMP14+EGFR - CBC with Differential/Platelet  2. Diabetes mellitus with peripheral vascular disease ([place]) - Bayer DCA Hb A1c Waived - CMP14+EGFR - CBC with Differential/Platelet - Ambulatory referral to Endocrinology  3. PVD (peripheral vascular disease) ([facility]) - CMP14+EGFR - CBC with Differential/Platelet  4. Chronic diastolic heart failure ([facility] - CMP14+EGFR - CBC with Differential/Platelet  5. Morbid obesity ([place]) - CMP14+EGFR - CBC with Differential/Platelet  6. Hospital discharge follow-up -  CMP14+EGFR - CBC with Differential/Platelet - Lipid panel  7. Hyperlipidemia associated with type 2 diabetes mellitus ([place]) - CMP14+EGFR - CBC with Differential/Platelet - Lipid panel   Labs pending Health Maintenance reviewed Diet and exercise encouraged  Follow up plan:  2 months   Evelina Dun, FNP

## 2017-12-18 NOTE — Patient Instructions (Signed)
Type 1 Diabetes Mellitus, Diagnosis, Adult Type 1 diabetes (type 1 diabetes mellitus) is a long-term (chronic) disease. It happens when your body does not make enough of a hormone called insulin. Insulin lets sugars (glucose) go into cells in the body. This gives you energy. If your body does not make enough insulin, sugars cannot get into cells. This causes high blood sugar (hyperglycemia). Your doctor will set treatment goals for you. Generally, you should have these blood sugar levels:  Before meals (preprandial): 80-130 mg/dL (4.4-7.2 mmol/L).  After meals (postprandial): below 180 mg/dL (10 mmol/L).  A1c (hemoglobin A1c) level: less than 7%.  Follow these instructions at home: Questions to Ask Your Doctor  You may want to ask these questions:  Do I need to meet with a diabetes educator?  Where can I find a support group for people with diabetes?  What equipment will I need to care for myself at home?  What diabetes medicines do I need? When should I take them?  How often do I need to check my blood sugar?  What number can I call if I have questions?  When is my next doctor's visit?  General instructions  Take over-the-counter and prescription medicines only as told by your doctor.  Keep all follow-up visits as told by your doctor. This is important. Contact a doctor if:  Your blood sugar is at or above 240 mg/dL (13.3 mmol/L) for 2 days in a row.  You have been sick or have had a fever for 2 days or more, and you are not getting better.  You have any of these problems for more than 6 hours: ? You cannot eat or drink. ? You feel sick to your stomach (nauseous). ? You throw up (vomit). ? You have watery poop (diarrhea). Get help right away if:  Your blood sugar is lower than 54 mg/dL (3 mmol/L).  You get confused.  You have trouble: ? Thinking clearly. ? Breathing.  You have moderate or large ketone levels in your pee (urine). This information is not  intended to replace advice given to you by your health care provider. Make sure you discuss any questions you have with your health care provider. Document Released: 05/28/2015 Document Revised: 07/12/2015 Document Reviewed: 03/09/2015 Elsevier Interactive Patient Education  [employer].

## 2017-12-19 LAB — CBC WITH DIFFERENTIAL/PLATELET
BASOS ABS: 0.1 10*3/uL (ref 0.0–0.2)
Basos: 1 %
EOS (ABSOLUTE): 0.2 10*3/uL (ref 0.0–0.4)
EOS: 2 %
Hematocrit: 44.2 % (ref 34.0–46.6)
Hemoglobin: 14.2 g/dL (ref 11.1–15.9)
Immature Grans (Abs): 0 10*3/uL (ref 0.0–0.1)
Immature Granulocytes: 0 %
Lymphocytes Absolute: 3.2 10*3/uL — ABNORMAL HIGH (ref 0.7–3.1)
Lymphs: 27 %
MCH: 29.8 pg (ref 26.6–33.0)
MCHC: 32.1 g/dL (ref 31.5–35.7)
MCV: 93 fL (ref 79–97)
MONOCYTES: 5 %
Monocytes Absolute: 0.6 10*3/uL (ref 0.1–0.9)
Neutrophils Absolute: 7.8 10*3/uL — ABNORMAL HIGH (ref 1.4–7.0)
Neutrophils: 65 %
PLATELETS: 333 10*3/uL (ref 150–450)
RBC: 4.77 x10E6/uL (ref 3.77–5.28)
RDW: 14 % (ref 12.3–15.4)
WBC: 11.9 10*3/uL — AB (ref 3.4–10.8)

## 2017-12-19 LAB — LIPID PANEL
Chol/HDL Ratio: 2.2 ratio (ref 0.0–4.4)
Cholesterol, Total: 151 mg/dL (ref 100–199)
HDL: 68 mg/dL (ref >39)
LDL Calculated: 63 mg/dL (ref 0–99)
Triglycerides: 98 mg/dL (ref 0–149)
VLDL CHOLESTEROL CAL: 20 mg/dL (ref 5–40)

## 2017-12-19 LAB — CMP14+EGFR
ALBUMIN: 4.2 g/dL (ref 3.6–4.8)
ALK PHOS: 56 IU/L (ref 39–117)
ALT: 26 IU/L (ref 0–32)
AST: 24 IU/L (ref 0–40)
Albumin/Globulin Ratio: 1.4 (ref 1.2–2.2)
BUN / CREAT RATIO: 16 (ref 12–28)
BUN: 15 mg/dL (ref 8–27)
Bilirubin Total: 0.2 mg/dL (ref 0.0–1.2)
CALCIUM: 10 mg/dL (ref 8.7–10.3)
CO2: 25 mmol/L (ref 20–29)
CREATININE: 0.93 mg/dL (ref 0.57–1.00)
Chloride: 96 mmol/L (ref 96–106)
GFR calc Af Amer: 74 mL/min/{1.73_m2} (ref >59)
GFR, EST NON AFRICAN AMERICAN: 64 mL/min/{1.73_m2} (ref >59)
GLUCOSE: 178 mg/dL — AB (ref 65–99)
Globulin, Total: 3.1 g/dL (ref 1.5–4.5)
Potassium: 4.7 mmol/L (ref 3.5–5.2)
Sodium: 139 mmol/L (ref 134–144)
Total Protein: 7.3 g/dL (ref 6.0–8.5)

## 2017-12-22 ENCOUNTER — Encounter: Payer: Self-pay | Admitting: Cardiovascular Disease

## 2017-12-22 ENCOUNTER — Ambulatory Visit (INDEPENDENT_AMBULATORY_CARE_PROVIDER_SITE_OTHER): Payer: Medicare Other | Admitting: Cardiovascular Disease

## 2017-12-22 VITALS — BP 138/76 | HR 61 | Ht 62.5 in | Wt 216.4 lb

## 2017-12-22 DIAGNOSIS — I5032 Chronic diastolic (congestive) heart failure: Secondary | ICD-10-CM

## 2017-12-22 DIAGNOSIS — I10 Essential (primary) hypertension: Secondary | ICD-10-CM

## 2017-12-22 DIAGNOSIS — I2510 Atherosclerotic heart disease of native coronary artery without angina pectoris: Secondary | ICD-10-CM | POA: Diagnosis not present

## 2017-12-22 DIAGNOSIS — E785 Hyperlipidemia, unspecified: Secondary | ICD-10-CM | POA: Diagnosis not present

## 2017-12-22 DIAGNOSIS — I739 Peripheral vascular disease, unspecified: Secondary | ICD-10-CM | POA: Diagnosis not present

## 2017-12-22 DIAGNOSIS — R0602 Shortness of breath: Secondary | ICD-10-CM

## 2017-12-22 NOTE — Patient Instructions (Addendum)
Medication Instructions:  Your physician recommends that you continue on your current medications as directed. Please refer to the Current Medication list given to you today.  If you need a refill on your cardiac medications before your next appointment, please call your pharmacy.   Lab work: none If you have labs (blood work) drawn today and your tests are completely normal, you will receive your results only by: Marland Kitchen MyChart Message (if you have MyChart) OR . A paper copy in the mail If you have any lab test that is abnormal or we need to change your treatment, we will call you to review the results.  Testing/Procedures: Your physician has requested that you have a lexiscan myoview. For further information please visit [email or web address]. Please follow instruction sheet, as given.    Follow-Up: At [facility], you and your health needs are our priority.  As part of our continuing mission to provide you with exceptional heart care, we have created designated Provider Care Teams.  These Care Teams include your primary Cardiologist (physician) and Advanced Practice Providers (APPs -  Physician Assistants and Nurse Practitioners) who all work together to provide you with the care you need, when you need it. You will need a follow up appointment in 6 months.  Please call our office 2 months in advance to schedule this appointment.  You may see Dr. Fletcher [name] or one of the following Advanced Practice Providers on your designated Care Team:   Kerin Ransom, PA-C Roby Lofts, [place] . Sande Rives, PA-C  Any Other Special Instructions Will Be Listed Below (If Applicable).   Low-Sodium Eating Plan Sodium, which is an element that makes up salt, helps you maintain a healthy balance of fluids in your body. Too much sodium can increase your blood pressure and cause fluid and waste to be held in your body. Your health care provider or dietitian may recommend following this plan if you have high blood  pressure (hypertension), kidney disease, liver disease, or heart failure. Eating less sodium can help lower your blood pressure, reduce swelling, and protect your heart, liver, and kidneys. What are tips for following this plan? General guidelines  Most people on this plan should limit their sodium intake to 1,500-2,000 mg (milligrams) of sodium each day. Reading food labels  The Nutrition Facts label lists the amount of sodium in one serving of the food. If you eat more than one serving, you must multiply the listed amount of sodium by the number of servings.  Choose foods with less than 140 mg of sodium per serving.  Avoid foods with 300 mg of sodium or more per serving. Shopping  Look for lower-sodium products, often labeled as "low-sodium" or "no salt added."  Always check the sodium content even if foods are labeled as "unsalted" or "no salt added".  Buy fresh foods. ? Avoid canned foods and premade or frozen meals. ? Avoid canned, cured, or processed meats  Buy breads that have less than 80 mg of sodium per slice. Cooking  Eat more home-cooked food and less restaurant, buffet, and fast food.  Avoid adding salt when cooking. Use salt-free seasonings or herbs instead of table salt or sea salt. Check with your health care provider or pharmacist before using salt substitutes.  Cook with plant-based oils, such as canola, sunflower, or olive oil. Meal planning  When eating at a restaurant, ask that your food be prepared with less salt or no salt, if possible.  Avoid foods that contain MSG (monosodium glutamate). MSG  is sometimes added to [place] food, bouillon, and some canned foods. What foods are recommended? The items listed may not be a complete list. Talk with your dietitian about what dietary choices are best for you. Grains Low-sodium cereals, including oats, puffed wheat and rice, and shredded wheat. Low-sodium crackers. Unsalted rice. Unsalted pasta. Low-sodium bread.  Whole-grain breads and whole-grain pasta. Vegetables Fresh or frozen vegetables. "No salt added" canned vegetables. "No salt added" tomato sauce and paste. Low-sodium or reduced-sodium tomato and vegetable juice. Fruits Fresh, frozen, or canned fruit. Fruit juice. Meats and other protein foods Fresh or frozen (no salt added) meat, poultry, seafood, and fish. Low-sodium canned tuna and salmon. Unsalted nuts. Dried peas, beans, and lentils without added salt. Unsalted canned beans. Eggs. Unsalted nut butters. Dairy Milk. Soy milk. Cheese that is naturally low in sodium, such as ricotta cheese, fresh mozzarella, or Swiss cheese Low-sodium or reduced-sodium cheese. Cream cheese. Yogurt. Fats and oils Unsalted butter. Unsalted margarine with no trans fat. Vegetable oils such as canola or olive oils. Seasonings and other foods Fresh and dried herbs and spices. Salt-free seasonings. Low-sodium mustard and ketchup. Sodium-free salad dressing. Sodium-free light mayonnaise. Fresh or refrigerated horseradish. Lemon juice. Vinegar. Homemade, reduced-sodium, or low-sodium soups. Unsalted popcorn and pretzels. Low-salt or salt-free chips. What foods are not recommended? The items listed may not be a complete list. Talk with your dietitian about what dietary choices are best for you. Grains Instant hot cereals. Bread stuffing, pancake, and biscuit mixes. Croutons. Seasoned rice or pasta mixes. Noodle soup cups. Boxed or frozen macaroni and cheese. Regular salted crackers. Self-rising flour. Vegetables Sauerkraut, pickled vegetables, and relishes. Olives. [place] fries. Onion rings. Regular canned vegetables (not low-sodium or reduced-sodium). Regular canned tomato sauce and paste (not low-sodium or reduced-sodium). Regular tomato and vegetable juice (not low-sodium or reduced-sodium). Frozen vegetables in sauces. Meats and other protein foods Meat or fish that is salted, canned, smoked, spiced, or pickled.  Bacon, ham, sausage, hotdogs, corned beef, chipped beef, packaged lunch meats, salt pork, jerky, pickled herring, anchovies, regular canned tuna, sardines, salted nuts. Dairy Processed cheese and cheese spreads. Cheese curds. Blue cheese. Feta cheese. String cheese. Regular cottage cheese. Buttermilk. Canned milk. Fats and oils Salted butter. Regular margarine. Ghee. Bacon fat. Seasonings and other foods Onion salt, garlic salt, seasoned salt, table salt, and sea salt. Canned and packaged gravies. Worcestershire sauce. Tartar sauce. Barbecue sauce. Teriyaki sauce. Soy sauce, including reduced-sodium. Steak sauce. Fish sauce. Oyster sauce. Cocktail sauce. Horseradish that you find on the shelf. Regular ketchup and mustard. Meat flavorings and tenderizers. Bouillon cubes. Hot sauce and Tabasco sauce. Premade or packaged marinades. Premade or packaged taco seasonings. Relishes. Regular salad dressings. Salsa. Potato and tortilla chips. Corn chips and puffs. Salted popcorn and pretzels. Canned or dried soups. Pizza. Frozen entrees and pot pies. Summary  Eating less sodium can help lower your blood pressure, reduce swelling, and protect your heart, liver, and kidneys.  Most people on this plan should limit their sodium intake to 1,500-2,000 mg (milligrams) of sodium each day.  Canned, boxed, and frozen foods are high in sodium. Restaurant foods, fast foods, and pizza are also very high in sodium. You also get sodium by adding salt to food.  Try to cook at home, eat more fresh fruits and vegetables, and eat less fast food, canned, processed, or prepared foods. This information is not intended to replace advice given to you by your health care provider. Make sure you discuss any questions you have  with your health care provider. Document Released: 07/26/2001 Document Revised: 01/28/2016 Document Reviewed: 01/28/2016 Elsevier Interactive Patient Education  [employer].

## 2017-12-22 NOTE — Progress Notes (Signed)
Cardiology Office Note   Date:  12/22/2017   ID:  Leah Ferrell, DOB 1950/05/26, MRN [id number]  PCP:  Sharion Balloon, FNP  Cardiologist: Dr. Harl Bowie  No chief complaint on file.     History of Present Illness: Leah Ferrell is a 67 y.o. female who presents for a followup visit regarding peripheral arterial disease.  She has known history of coronary artery disease status post drug-eluting stent placement to the LAD X 2 Most recently in 2015 for in-stent restenosis.  She is known to have peripheral arterial disease status post left common iliac artery stent placement followed by left common femoral artery resection and bypass from left external iliac to the left profunda done by Dr. Kellie Simmering in 2015. She is known to have chronically occluded left SFA being managed medically.  She had worsening left leg claudication with elevated velocity at the proximal anastomosis of the graft.   Angiography in March 2018 showed mild to moderate calcified common iliac artery disease with patent stent in the left common iliac artery. There was severe stenosis in the left common femoral artery at the proximal anastomosis of the bypass to the profunda. I performed successful angioplasty and drug-coated balloon angioplasty without complications.  She was hospitalized recently at [facility] with shortness of breath and mild volume overload.  She improved with IV diuresis.  She admits that the hospitalization was preceded by large intake of sodium.  She feels better overall but continues to have exertional dyspnea without chest pain.  She had an echocardiogram done while hospitalized which showed an EF of 65 to 70% with grade 2 diastolic dysfunction.  Carotid Doppler showed moderate left carotid stenosis.   Past Medical History:  Diagnosis Date  . CAD (coronary artery disease)    a. 04/2012 NSTEMI: s/p DES to LAD.  Marland Kitchen Chronic diastolic CHF (congestive heart failure) ([place])   . Diabetes mellitus without  complication ([place])   . Dysrhythmia   . Environmental and seasonal allergies   . Hyperlipidemia   . Hypertension   . Leukocytosis   . Morbid obesity ([place])   . NSTEMI (non-ST elevated myocardial infarction) ([place]) 04/27/2012  . PONV (postoperative nausea and vomiting)   . PVD (peripheral vascular disease) ([place])    a. 04/2012 ABI: R 0.49, L 0.39. Angiography 06/14: Significant ostial left common iliac artery stenosis extending into the distal aorta, severe left common femoral artery stenosis, bilateral SFA occlusion with heavy calcifications. Functionally, one-vessel runoff bilaterally below the knee  . Shortness of breath     Past Surgical History:  Procedure Laterality Date  . ABDOMINAL AORTAGRAM N/A 07/21/2012   Procedure: ABDOMINAL Maxcine Ham;  Surgeon: Wellington Hampshire, MD;  Location: [facility] CATH LAB;  Service: Cardiovascular;  Laterality: N/A;  . CARDIAC CATHETERIZATION     stents X 2  . CORONARY ANGIOGRAM  04/27/2012   Procedure: CORONARY ANGIOGRAM;  Surgeon: Thayer Headings, MD;  Location: [facility] CATH LAB;  Service: Cardiovascular;;  . ENDARTERECTOMY FEMORAL Left 11/02/2013   Procedure: RESECTION LEFT COMMON FEMORAL ARTERY AND INSERTION OF INTERPOSITION 84mm HEMASHIELD GRAFT FROM LEFT EXTERNAL  ILIAC ARTERY TO LEFT PROFUNDA  FEMORIS ARTERY;  Surgeon: Mal Misty, MD;  Location: [facility];  Service: Vascular;  Laterality: Left;  . EYE SURGERY Right 2017  . FRACTURE SURGERY Right Jul 07, 2014   Right Foot-  [place]  [facility]  by Heat duct  . ILIAC ARTERY STENT Left 08/31/2013  . LAD stent    .  LEFT HEART CATHETERIZATION WITH CORONARY ANGIOGRAM N/A 04/23/2012   Procedure: LEFT HEART CATHETERIZATION WITH CORONARY ANGIOGRAM;  Surgeon: Peter M Martinique, MD;  Location: [facility] CATH LAB;  Service: Cardiovascular;  Laterality: N/A;  . LOWER EXTREMITY ANGIOGRAM Left 08/31/2013   Procedure: LOWER EXTREMITY ANGIOGRAM;  Surgeon: Wellington Hampshire, MD;  Location: [facility] CATH LAB;  Service: Cardiovascular;  Laterality: Left;    . PERCUTANEOUS STENT INTERVENTION Left 08/31/2013   Procedure: PERCUTANEOUS STENT INTERVENTION;  Surgeon: Wellington Hampshire, MD;  Location: [facility] CATH LAB;  Service: Cardiovascular;  Laterality: Left;  Common Iliac artery  . PERIPHERAL VASCULAR CATHETERIZATION N/A 03/19/2016   Procedure: Abdominal Aortogram w/Lower Extremity;  Surgeon: Wellington Hampshire, MD;  Location: [facility] CV LAB;  Service: Cardiovascular;  Laterality: N/A;  . PERIPHERAL VASCULAR CATHETERIZATION  03/19/2016   Procedure: Peripheral Vascular Balloon Angioplasty-CFA Left;  Surgeon: Wellington Hampshire, MD;  Location: [facility] CV LAB;  Service: Cardiovascular;;  . 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 albuterol (PROVENTIL HFA) 108 (90 Base) MCG/ACT inhaler INHALE ONE TO TWO PUFFS BY  MOUTH EVERY 6 HOURS AS  NEEDED FOR WHEEZING OR FOR  SHORTNESS OF BREATH (Patient taking differently: Inhale 1-2 puffs into the lungs every 6 (six) hours as needed for wheezing or shortness of breath. ) 13.4 g 2  . amLODipine (NORVASC) 10 MG tablet Take 1 tablet (10 mg total) by mouth daily. 90 tablet 1  . Ascorbic Acid (VITAMIN C) 1000 MG tablet Take 1,000 mg by mouth 2 (two) times daily.    Marland Kitchen aspirin EC 81 MG tablet Take 81 mg by mouth every morning.     . Calcium Carbonate-Vit D-Min (CALTRATE 600+D PLUS MINERALS) 600-800 MG-UNIT TABS Take 1 tablet by mouth 3 (three) times daily.     . clopidogrel (PLAVIX) 75 MG tablet TAKE 1 TABLET BY MOUTH  DAILY (Patient taking differently: Take 75 mg by mouth daily. ) 90 tablet 0  . fish oil-omega-3 fatty acids 1000 MG capsule Take 1 g by mouth every morning.    . furosemide (LASIX) 40 MG tablet TAKE 1 TABLET DAILY MAY TAKE ADDITIONAL TABLET AS NEEDED FOR SWELLING (Patient taking differently: Take 40 mg by mouth daily. ** MAY TAKE ADDITIONAL TABLET AS NEEDED FOR SWELLING) 180 tablet 1  . hydrALAZINE (APRESOLINE) 25 MG tablet Take 1 tablet  (25 mg total) by mouth 3 (three) times daily. 270 tablet 3  . hydroxypropyl methylcellulose (ISOPTO TEARS) 2.5 % ophthalmic solution Place 1 drop into both eyes 3 (three) times daily as needed (for dry eyes).    . Insulin Lispro (HUMALOG KWIKPEN) 200 UNIT/ML SOPN Inject 60 Units into the skin 3 (three) times daily before meals. 36 mL 2  . lisinopril (PRINIVIL,ZESTRIL) 20 MG tablet Take 2 tablets (40 mg total) by mouth daily. (Patient taking differently: Take 20 mg by mouth 2 (two) times daily. ) 180 tablet 0  . metoprolol tartrate (LOPRESSOR) 100 MG tablet Take 1 tablet (100 mg total) by mouth 2 (two) times daily. 180 tablet 3  . montelukast (SINGULAIR) 10 MG tablet Take 1 tablet (10 mg total) by mouth daily with breakfast. 30 tablet 1  . mupirocin ointment (BACTROBAN) 2 % Apply 1 application topically 2 (two) times daily. 22 g 0  . nitroGLYCERIN (NITROSTAT) 0.4 MG SL tablet Place 1 tablet (0.4 mg total) under the tongue every 5 (five) minutes x 3 doses  as needed for chest pain (up to 3 doses). 25 tablet 4  . potassium chloride SA (K-DUR,KLOR-CON) 20 MEQ tablet Take 1 tablet (20 mEq total) by mouth daily. (Patient taking differently: Take 10 mEq by mouth daily. ) 90 tablet 1  . RELION PEN NEEDLES 29G X 12MM MISC USE 1 NEEDLE TO INJECT INSULIN SUBCUTANEOUSLY 3 TIMES DAILY 50 each 5  . rosuvastatin (CRESTOR) 40 MG tablet Take 1 tablet (40 mg total) by mouth daily. 90 tablet 3  . triamcinolone cream (KENALOG) 0.1 % Apply 1 application topically 2 (two) times daily. 80 g 1   No current facility-administered medications for this visit.     Allergies:   Tape    Social History:  The patient  reports that she quit smoking about 16 years ago. Her smoking use included cigarettes. She started smoking about 53 years ago. She has a 36.00 pack-year smoking history. She has never used smokeless tobacco. She reports that she does not drink alcohol or use drugs.   Family History:  The patient's family history  includes Alcohol abuse in her brother; Diabetes in her brother, daughter, maternal grandmother, mother, and son; Heart attack in her maternal grandfather; Heart disease in her brother; Hyperlipidemia in her mother and son; Hypertension in her daughter, mother, and son; Other in her sister and sister; Peripheral vascular disease in her mother.    ROS:  Please see the history of present illness.   Otherwise, review of systems are positive for none.   All other systems are reviewed and negative.    PHYSICAL EXAM: VS:  BP 138/76   Pulse 61   Ht 5' 2.5" (1.588 m)   Wt 216 lb 6.4 oz (98.2 kg)   BMI 38.95 kg/m  , BMI Body mass index is 38.95 kg/m. GEN: Well nourished, well developed, in no acute distress  HEENT: normal  Neck: no JVD, carotid bruits, or masses Cardiac: RRR; no  rubs, or gallops , trace edema . There is 2/6 systolic ejection murmur in the aortic area Respiratory:  clear to auscultation bilaterally, normal work of breathing GI: soft, nontender, nondistended, + BS MS: no deformity or atrophy  Skin: warm and dry, no rash Neuro:  Strength and sensation are intact Psych: euthymic mood, full affect Vascular: Femoral pulses : +1 on the right and not palpable on the left with a surgical scar.  Distal pulses are not palpable.  EKG:  EKG is  ordered today.  EKG showed normal sinus rhythm with left atrial enlargement.  Recent Labs: 12/01/2017: B Natriuretic Peptide 157.0 12/03/2017: Magnesium 2.3 12/18/2017: ALT 26; BUN 15; Creatinine, Ser 0.93; Hemoglobin 14.2; Platelets 333; Potassium 4.7; Sodium 139    Lipid Panel    Component Value Date/Time   CHOL 151 12/18/2017 1159   TRIG 98 12/18/2017 1159   TRIG 135 09/16/2012 1322   HDL 68 12/18/2017 1159   HDL 60 09/16/2012 1322   CHOLHDL 2.2 12/18/2017 1159   CHOLHDL 3.2 05/06/2012 1713   VLDL 32 05/06/2012 1713   LDLCALC 63 12/18/2017 1159   LDLCALC 92 09/16/2012 1322      Wt Readings from Last 3 Encounters:  12/22/17 216  lb 6.4 oz (98.2 kg)  12/18/17 212 lb (96.2 kg)  12/03/17 216 lb 4.3 oz (98.1 kg)       No flowsheet data found.    ASSESSMENT AND PLAN:  1.  Peripheral arterial disease: Stable bilateral leg claudication.  Most recent vascular studies in September showed stable ABI  of 0.5 range.  2. Coronary artery disease involving native coronary arteries: Recent worsening of exertional dyspnea which could be angina equivalent given that she had no chest pain in the past event with obstructive coronary artery disease.  Due to that, I requested a Lexiscan Myoview.   3. Essential hypertension: Blood pressure is controlled on current medications.  4. Hyperlipidemia: The dose of rosuvastatin was increased to 40 mg once daily.  Most recent lipid profile showed an LDL of 63.  5.  Chronic diastolic heart failure: Recent hospitalization was likely due to excessive salt intake but we have to rule out ischemia as outlined above.  We provided her with low sodium diet instructions.  Disposition:   FU with me in 6  month  Signed,  [name], MD  12/22/2017 12:55 PM    [place]

## 2017-12-29 NOTE — Addendum Note (Signed)
Addended by: Crissie Reese on: 12/29/2017 02:11 PM   Modules accepted: Orders

## 2017-12-30 ENCOUNTER — Ambulatory Visit (HOSPITAL_COMMUNITY)
Admission: RE | Admit: 2017-12-30 | Discharge: 2017-12-30 | Disposition: A | Discharge status: Home / Self Care | Payer: Medicare Other | Source: Ambulatory Visit | Attending: Cardiovascular Disease | Admitting: Cardiovascular Disease

## 2017-12-30 ENCOUNTER — Encounter (HOSPITAL_COMMUNITY): Payer: Self-pay

## 2017-12-30 ENCOUNTER — Encounter (HOSPITAL_COMMUNITY)
Admission: RE | Admit: 2017-12-30 | Discharge: 2017-12-30 | Disposition: A | Discharge status: Home / Self Care | Payer: Medicare Other | Source: Ambulatory Visit | Attending: Cardiovascular Disease | Admitting: Cardiovascular Disease

## 2017-12-30 DIAGNOSIS — I2510 Atherosclerotic heart disease of native coronary artery without angina pectoris: Secondary | ICD-10-CM | POA: Insufficient documentation

## 2017-12-30 DIAGNOSIS — R0602 Shortness of breath: Secondary | ICD-10-CM | POA: Insufficient documentation

## 2017-12-30 LAB — NM MYOCAR MULTI W/SPECT W/WALL MOTION / EF
CHL CUP NUCLEAR SDS: 5
CHL CUP RESTING HR STRESS: 49 {beats}/min
LHR: 0.32
LV dias vol: 92 mL (ref 46–106)
LVSYSVOL: 36 mL
Peak HR: 66 {beats}/min
SRS: 1
SSS: 6
TID: 1.02

## 2017-12-30 MED ORDER — REGADENOSON 0.4 MG/5ML IV SOLN
INTRAVENOUS | Status: AC
  Administered 2017-12-30: 0.4 mg via INTRAVENOUS
  Filled 2017-12-30: qty 5

## 2017-12-30 MED ORDER — SODIUM CHLORIDE 0.9% FLUSH
INTRAVENOUS | Status: AC
  Administered 2017-12-30: 10 mL via INTRAVENOUS
  Filled 2017-12-30: qty 10

## 2017-12-30 MED ORDER — TECHNETIUM TC 99M TETROFOSMIN IV KIT
30.0000 | PACK | Freq: Once | INTRAVENOUS | Status: AC | PRN
  Administered 2017-12-30: 30 via INTRAVENOUS

## 2017-12-30 MED ORDER — TECHNETIUM TC 99M TETROFOSMIN IV KIT
10.0000 | PACK | Freq: Once | INTRAVENOUS | Status: AC | PRN
  Administered 2017-12-30: 9.9 via INTRAVENOUS

## 2018-01-01 ENCOUNTER — Telehealth: Payer: Self-pay | Admitting: Family

## 2018-01-01 NOTE — Telephone Encounter (Signed)
Sending copy of last labs to patient by mail.

## 2018-01-28 ENCOUNTER — Ambulatory Visit: Payer: Medicare Other | Admitting: Cardiology

## 2018-01-28 ENCOUNTER — Other Ambulatory Visit: Payer: Self-pay | Admitting: Family

## 2018-01-28 DIAGNOSIS — I10 Essential (primary) hypertension: Principal | ICD-10-CM

## 2018-01-28 DIAGNOSIS — E1159 Type 2 diabetes mellitus with other circulatory complications: Secondary | ICD-10-CM

## 2018-01-28 DIAGNOSIS — I152 Hypertension secondary to endocrine disorders: Secondary | ICD-10-CM

## 2018-01-28 NOTE — Progress Notes (Deleted)
Clinical Summary Leah Ferrell is a 67 y.o.female seen today for follow up of the following medical problems.   1. CAD  - prior NSTEMI 04/2012 with DES to LAD  - 07/21/2013 cath at [facility] after abnormal nuclear stress test. Succesfull pci to LAD with DES.  - 03/2016 echo LVEF 65-70%, no WMAs.  - Jan 2018 nuclear stress: no ischemia.   - no exertional chest pain. No SOB or DOE.  - compliant with meds  - seen by Dr Fletcher [name] 12/2017 with some DOE. Lexiscan was ordred.  12/2017 nuclear stress small distal anterior/apical infarct with mild peri-infarct ischemia, low risk    2. PAD  - followed by Dr Fletcher [name].    3. Chronic diastolic heart failure  - can have some swelling at times. Has lasix prn as needed.   - admission 11/2017 with acute on chronic diastolic HF - 00/8676 echo LVEF 65-70%, grade II diastolic dysfunction  4. HTN  - compliant with meds  5. Hyperlipidemia   05/2017 TC 175 TG 108 HDL 69 LDL 84 - not interested in icnrased crestor.     PP:[place] recentlyfrom [place] to [facility] in late May, a house with 3 acres.   Past Medical History:  Diagnosis Date  . CAD (coronary artery disease)    a. 04/2012 NSTEMI: s/p DES to LAD.  Marland Kitchen Chronic diastolic CHF (congestive heart failure) ([place])   . Diabetes mellitus without complication ([place])   . Dysrhythmia   . Environmental and seasonal allergies   . Hyperlipidemia   . Hypertension   . Leukocytosis   . Morbid obesity ([place])   . NSTEMI (non-ST elevated myocardial infarction) ([place]) 04/27/2012  . PONV (postoperative nausea and vomiting)   . PVD (peripheral vascular disease) ([place])    a. 04/2012 ABI: R 0.49, L 0.39. Angiography 06/14: Significant ostial left common iliac artery stenosis extending into the distal aorta, severe left common femoral artery stenosis, bilateral SFA occlusion with heavy calcifications. Functionally, one-vessel runoff bilaterally below the knee  . Shortness of breath       Allergies  Allergen Reactions  . Tape Rash     Current Outpatient Medications  Medication Sig Dispense Refill  . albuterol (PROVENTIL HFA) 108 (90 Base) MCG/ACT inhaler INHALE ONE TO TWO PUFFS BY  MOUTH EVERY 6 HOURS AS  NEEDED FOR WHEEZING OR FOR  SHORTNESS OF BREATH (Patient taking differently: Inhale 1-2 puffs into the lungs every 6 (six) hours as needed for wheezing or shortness of breath. ) 13.4 g 2  . amLODipine (NORVASC) 10 MG tablet Take 1 tablet (10 mg total) by mouth daily. 90 tablet 1  . Ascorbic Acid (VITAMIN C) 1000 MG tablet Take 1,000 mg by mouth 2 (two) times daily.    Marland Kitchen aspirin EC 81 MG tablet Take 81 mg by mouth every morning.     . Calcium Carbonate-Vit D-Min (CALTRATE 600+D PLUS MINERALS) 600-800 MG-UNIT TABS Take 1 tablet by mouth 3 (three) times daily.     . clopidogrel (PLAVIX) 75 MG tablet TAKE 1 TABLET BY MOUTH  DAILY (Patient taking differently: Take 75 mg by mouth daily. ) 90 tablet 0  . fish oil-omega-3 fatty acids 1000 MG capsule Take 1 g by mouth every morning.    . furosemide (LASIX) 40 MG tablet TAKE 1 TABLET DAILY MAY TAKE ADDITIONAL TABLET AS NEEDED FOR SWELLING (Patient taking differently: Take 40 mg by mouth daily. ** MAY TAKE ADDITIONAL TABLET AS NEEDED FOR SWELLING) 180 tablet 1  .  hydrALAZINE (APRESOLINE) 25 MG tablet Take 1 tablet (25 mg total) by mouth 3 (three) times daily. 270 tablet 3  . hydroxypropyl methylcellulose (ISOPTO TEARS) 2.5 % ophthalmic solution Place 1 drop into both eyes 3 (three) times daily as needed (for dry eyes).    . Insulin Lispro (HUMALOG KWIKPEN) 200 UNIT/ML SOPN Inject 60 Units into the skin 3 (three) times daily before meals. 36 mL 2  . lisinopril (PRINIVIL,ZESTRIL) 20 MG tablet Take 2 tablets (40 mg total) by mouth daily. (Patient taking differently: Take 20 mg by mouth 2 (two) times daily. ) 180 tablet 0  . metoprolol tartrate (LOPRESSOR) 100 MG tablet Take 1 tablet (100 mg total) by mouth 2 (two) times daily. 180  tablet 3  . montelukast (SINGULAIR) 10 MG tablet Take 1 tablet (10 mg total) by mouth daily with breakfast. 30 tablet 1  . mupirocin ointment (BACTROBAN) 2 % Apply 1 application topically 2 (two) times daily. 22 g 0  . nitroGLYCERIN (NITROSTAT) 0.4 MG SL tablet Place 1 tablet (0.4 mg total) under the tongue every 5 (five) minutes x 3 doses as needed for chest pain (up to 3 doses). 25 tablet 4  . potassium chloride SA (K-DUR,KLOR-CON) 20 MEQ tablet Take 1 tablet (20 mEq total) by mouth daily. (Patient taking differently: Take 10 mEq by mouth daily. ) 90 tablet 1  . RELION PEN NEEDLES 29G X 12MM MISC USE 1 NEEDLE TO INJECT INSULIN SUBCUTANEOUSLY 3 TIMES DAILY 50 each 5  . rosuvastatin (CRESTOR) 40 MG tablet Take 1 tablet (40 mg total) by mouth daily. 90 tablet 3  . triamcinolone cream (KENALOG) 0.1 % Apply 1 application topically 2 (two) times daily. 80 g 1   No current facility-administered medications for this visit.      Past Surgical History:  Procedure Laterality Date  . ABDOMINAL AORTAGRAM N/A 07/21/2012   Procedure: ABDOMINAL Maxcine Ham;  Surgeon: Wellington Hampshire, MD;  Location: [facility] CATH LAB;  Service: Cardiovascular;  Laterality: N/A;  . CARDIAC CATHETERIZATION     stents X 2  . CORONARY ANGIOGRAM  04/27/2012   Procedure: CORONARY ANGIOGRAM;  Surgeon: Thayer Headings, MD;  Location: [facility] CATH LAB;  Service: Cardiovascular;;  . ENDARTERECTOMY FEMORAL Left 11/02/2013   Procedure: RESECTION LEFT COMMON FEMORAL ARTERY AND INSERTION OF INTERPOSITION 21mm HEMASHIELD GRAFT FROM LEFT EXTERNAL  ILIAC ARTERY TO LEFT PROFUNDA  FEMORIS ARTERY;  Surgeon: Mal Misty, MD;  Location: [facility];  Service: Vascular;  Laterality: Left;  . EYE SURGERY Right 2017  . FRACTURE SURGERY Right Jul 07, 2014   Right Foot-  Pt.fell  at Home  by Heat duct  . ILIAC ARTERY STENT Left 08/31/2013  . LAD stent    . LEFT HEART CATHETERIZATION WITH CORONARY ANGIOGRAM N/A 04/23/2012   Procedure: LEFT HEART CATHETERIZATION WITH  CORONARY ANGIOGRAM;  Surgeon: Peter M Martinique, MD;  Location: [facility] CATH LAB;  Service: Cardiovascular;  Laterality: N/A;  . LOWER EXTREMITY ANGIOGRAM Left 08/31/2013   Procedure: LOWER EXTREMITY ANGIOGRAM;  Surgeon: Wellington Hampshire, MD;  Location: [facility] CATH LAB;  Service: Cardiovascular;  Laterality: Left;  . PERCUTANEOUS STENT INTERVENTION Left 08/31/2013   Procedure: PERCUTANEOUS STENT INTERVENTION;  Surgeon: Wellington Hampshire, MD;  Location: [facility] CATH LAB;  Service: Cardiovascular;  Laterality: Left;  Common Iliac artery  . PERIPHERAL VASCULAR CATHETERIZATION N/A 03/19/2016   Procedure: Abdominal Aortogram w/Lower Extremity;  Surgeon: Wellington Hampshire, MD;  Location: [facility] CV LAB;  Service: Cardiovascular;  Laterality: N/A;  .  PERIPHERAL VASCULAR CATHETERIZATION  03/19/2016   Procedure: Peripheral Vascular Balloon Angioplasty-CFA Left;  Surgeon: Wellington Hampshire, MD;  Location: [facility] CV LAB;  Service: Cardiovascular;;  . 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 Tape Rash      Family History  Problem Relation Age of Onset  . Diabetes Mother   . Hyperlipidemia Mother   . Hypertension Mother   . Peripheral vascular disease Mother   . Diabetes Maternal Grandmother   . Heart attack Maternal Grandfather   . Heart disease Brother   . Other Sister        drowned  . Diabetes Brother   . Alcohol abuse Brother   . Other Sister        drowned  . Diabetes Daughter        borderline  . Hypertension Daughter   . Diabetes Son   . Hypertension Son   . Hyperlipidemia Son      Social History Leah Ferrell reports that she quit smoking about 16 years ago. Her smoking use included cigarettes. She started smoking about 53 years ago. She has a 36.00 pack-year smoking history. She has never used smokeless tobacco. Leah Ferrell reports no history of alcohol use.   Review of Systems CONSTITUTIONAL: No weight loss, fever, chills, weakness or fatigue.   HEENT: Eyes: No visual loss, blurred vision, double vision or yellow sclerae.No hearing loss, sneezing, congestion, runny nose or sore throat.  SKIN: No rash or itching.  CARDIOVASCULAR:  RESPIRATORY: No shortness of breath, cough or sputum.  GASTROINTESTINAL: No anorexia, nausea, vomiting or diarrhea. No abdominal pain or blood.  GENITOURINARY: No burning on urination, no polyuria NEUROLOGICAL: No headache, dizziness, syncope, paralysis, ataxia, numbness or tingling in the extremities. No change in bowel or bladder control.  MUSCULOSKELETAL: No muscle, back pain, joint pain or stiffness.  LYMPHATICS: No enlarged nodes. No history of splenectomy.  PSYCHIATRIC: No history of depression or anxiety.  ENDOCRINOLOGIC: No reports of sweating, cold or heat intolerance. No polyuria or polydipsia.  Marland Kitchen   Physical Examination There were no vitals filed for this visit. There were no vitals filed for this visit.  Gen: resting comfortably, no acute distress HEENT: no scleral icterus, pupils equal round and reactive, no palptable cervical adenopathy,  CV Resp: Clear to auscultation bilaterally GI: abdomen is soft, non-tender, non-distended, normal bowel sounds, no hepatosplenomegaly MSK: extremities are warm, no edema.  Skin: warm, no rash Neuro:  no focal deficits Psych: appropriate affect   Diagnostic Studies 04/2012 Echo: LVEF 55-60%, no WMA, mild LVH, grade I diastolic dysfunction  0/7622 ABI: Right 0.57 left 0.48. Moderate left iliac disease, distal right SFA occlusion with reconstitution at the popliteal.  04/2012 Carotid US: bilateral plaque without significant stenosis.  04/2012 Cardiac Cath PROCEDURAL FINDINGS  Hemodynamics:  AO 155/58 mean 91 mm Hg  LV 163/13 mm Hg  Coronary angiography:  Coronary dominance: right  Left mainstem: Short with shared coronary ostia for the LAD and LCX.  Left anterior descending (LAD): diffuse 30-40% proximal. 90% focal lesion after the  first diagonal.  Left circumflex (LCx): <20% proximal and mid. 80% very small distal OM.  Right coronary artery (RCA): Diffuse 20% proximal and mid vessel disease. Moderate calcification.  Left ventriculography: Left ventricular systolic function is normal, LVEF is estimated at 55-65%, there is no significant mitral regurgitation  PCI Note: Following the diagnostic procedure, the decision was made to proceed with  PCI. The radial sheath was upsized to a 6 [place]. Bilinta 180 mg was given orally. Weight-based bivalirudin was given for anticoagulation. Once a therapeutic ACT was achieved, a 6 [place] XB 3.0 guide catheter was inserted. A prowater coronary guidewire was used to cross the lesion. The lesion was predilated with a 2.5 mm balloon. The lesion was then stented with a 3.0x 16 mm Promus premier stent. The stent was postdilated with a 3.25 mm noncompliant balloon. Following PCI, there was 0% residual stenosis and TIMI-3 flow. Final angiography confirmed an excellent result. The patient tolerated the procedure well. There were no immediate procedural complications. A TR band was used for radial hemostasis. The patient was transferred to the post catheterization recovery area for further monitoring.  PCI Data:  Vessel - LAD/Segment - mid  Percent Stenosis (pre) 90%  TIMI-flow 3  Stent 3.0 x 16 mm Promus premier  Percent Stenosis (post) 0%  TIMI-flow (post) 3  Final Conclusions:  1. Single vessel obstructive CAD  2. Normal LV function.  3. Successful stenting of the mid LAD with a DES.   07/2012 Lower Extremity Angiography Hemodynamics: Central Aortic Pressure / Mean Aortic Pressure: 157/70  Findings:   Abdominal aorta: Normal in size with no aneurysm. There is mild atherosclerosis below the renal arteries. Distally above the iliac bifurcation there is a calcified plaque at the origin of the left external iliac causing 30% stenosis.   Left renal artery: Normal    Right renal artery: Normal   Celiac artery: Patent   Superior mesenteric artery: Patent   Right common iliac artery: Diffuse 20% disease.   Right internal iliac artery: Normal   Right external iliac artery: Normal   Right common femoral artery: Minor irregularities.   Right profunda femoral artery: Diffuse 20% disease proximally.   Right superficial femoral artery: Diffuse atherosclerosis throughout its course. There is 20% disease at the ostium. This is followed by 70% stenosis proximally. There is another 70% stenosis in the midsegment followed by an occlusion distally Reconstituting in the proximal popliteal artery. This segment is about 50 mm and heavily calcified.   Right popliteal artery: 20-30% disease proximally.   Right tibial peroneal trunk: Normal   Right anterior tibial artery: Diffusely diseased proximally and seems to be occluded in the midsegment.   Right peroneal artery: Patent but diffusely diseased.   Right posterior tibial artery: Patent and the dominant vessel below the knee. It has minor irregularities.   Left common iliac artery: There is a 70-80 % heavily calcified lesion proximally.   Left internal iliac artery: Diffusely diseased.   Left external iliac artery: Minor irregularities.   Left common femoral artery: Heavily calcified with diffuse 90% disease extending into the ostium of the SFA.   Left profunda femoral artery: Normal.   Left superficial femoral artery: 70% ostial disease with heavy calcifications. Diffuse 40% disease proximally. The vessel is occluded in the midsegment with reconstitution distally via collaterals from the profunda.   Left popliteal artery: Minor irregularities.   Left tibial peroneal trunk: Minor irregularities.   Left anterior tibial artery: Patent. This is the dominant vessel.   Left peroneal artery: Patent but diffusely diseased.   Left posterior tibial artery: Occlude  proximally. Conclusions: 1. Significant heavily calcified ostial left common iliac artery stenosis with plaque extending into the distal aorta.  2. Severe heavily calcified disease in the left common femoral artery.  3. Bilateral SFA occlusion with heavy calcifications.  4. Significant below the knee disease bilaterally as  outlined above.  Recommendations:  The patient has diffuse and heavily calcified peripheral arterial disease. Revascularization options are somewhat difficult by endovascular means. There is heavily calcified disease in the left common femoral artery which is not approachable by endovascular options. Endarterectomy is the best option for this. The stenosis in the left common iliac artery can be treated with stenting but will require likely stenting the distal aorta as well. Both SFAs are diffusely diseased, calcified with chronic occlusion. We'll attempt medical therapy and a walking program for now.   11/2012 Labs: 140 K 4.2 Cl 97 Cr 0.75 BUN 9 AST 16 ALT 21 T.bili 0.2 TC 205 TG 169 HDL 70 LDL 101  01/2013 Event Monitor No symptoms, no arrythmias  06/2013 Lexiscan MPI Large reversible defect in the basal anterior, basal anteroseptal, mid anterior, mid anteroseptal, apical anteior, apical segments mild to moderate.  07/21/13 Cath Hemodynamic Measurements Pressures:  - AO 130 / 56. - LV systolic 419 LV End-diastolic 13.  Angiographic Findings: Left main: Normal Left anterior descending: There is 70% stenosis at the proximal segment, first diagonal is intermediate in size, there is a stent in mid LAD which is patent. Left circumflex: Angiographically normal Right coronary artery: There is 30% narrowing in the mid segment. Right dominant  Left ventriculogram: Not done.   INTERVENTIONAL PROCEDURE NARRATIVE:  Intra-operative Anticoagulation: Additional dose of 2500 heparin IV  Equipment - Guide Catheters: 6 [place] EBU 3 - Guide Wires:  Run-through  Procedure Details: I advanced a 3.0 x 12 balloon and predilated the lesion up to 14 atmospheres for 20 seconds and then I repeated that for another 14 atmospheres for 20 seconds. Then I advanced a Promus 3.5 x 16 drug-eluting stent and deployed it at the ostium and  proximal LAD with extreme care not to involve the ostium of left circumflex. I deployed the stent at 16 atmospheres, for 45 seconds, enlarging the stent to 3.7 mm. Post deployment angiography showed excellent results no complications. I gave the patient  Effient 60 mg, removed the catheter and used a TR band to obtain hemostasis.Marland Kitchen  Post-operative Anticoagulation: Aspirin and Effient  Complications: None  IMPRESSION: 1. Successful PCI to proximal LAD with a Promus 3.5 x 16 drug-eluting stent, postdilated to 3.7 mm. 2. Patent mid LAD stent. 3. Normal left ventricular end-diastolic pressure of 13 mm Hg. 4. Coronary angiography and percutaneous coronary intervention performed with access through the right radial artery.  PLAN: 1. Aspirin 81 mg and Effient 10 mg daily for at least one year 2. Continue medical management of coronary artery disease. 3. Due to the fact that patient has significant life style limiting Rutherford class III claudication, I will proceed with PVR and duplex study. 4. Followup with Dr. Hamilton Capri.    Jan 2018 Nuclear stress  Nuclear stress EF: 59%.  The study is normal.  This is a low risk study.  There was no ST segment deviation noted during stress.  Low risk stress nuclear study with normal perfusion and normal left ventricular regional and global systolic function.    03/2016 echo Study Conclusions  - Left ventricle: The cavity size was normal. Wall thickness was increased in a pattern of severe LVH. Systolic function was vigorous. The estimated ejection fraction was in the range of 65% to 70%. Wall motion was normal; there were no regional wall motion  abnormalities. Doppler parameters are consistent with abnormal left ventricular relaxation (grade 1 diastolic dysfunction). Doppler parameters are consistent with high ventricular filling pressure. - Mitral  valve: Calcified annulus. Mildly thickened leaflets . - Left atrium: The atrium was moderately dilated.  Impressions:  - Vigorous LV systolic function; severe LVH; grade 1 diastolic dysfunction with elevated LV filling pressure; sclerotic aortic valve; moderate LAE.  11/2017 echo Study Conclusions  - Left ventricle: The cavity size was normal. Wall thickness was   increased in a pattern of moderate LVH. Systolic function was   vigorous. The estimated ejection fraction was in the range of 65%   to 70%. Wall motion was normal; there were no regional wall   motion abnormalities. Features are consistent with a pseudonormal   left ventricular filling pattern, with concomitant abnormal   relaxation and increased filling pressure (grade 2 diastolic   dysfunction). Doppler parameters are consistent with high   ventricular filling pressure. - Aortic valve: Mildly calcified annulus. Trileaflet. - Mitral valve: Mildly calcified annulus. There was mild   regurgitation. - Left atrium: The atrium was mildly dilated.    12/2017 nuclear stress  There was no ST segment deviation noted during stress.  Findings consistent with prior small distal anterior/apical myocardial infarction with mild peri-infarct ischemia.  This is a low risk study.  The left ventricular ejection fraction is normal (55-65%).    Assessment and Plan  1. CAD -no symptoms, continue current meds  2. PAD - continue to follow with Dr Fletcher [name] and vascular  3. Chronic diastolic HF - doing well, continue current meds  4. HTN - manual recheck 132/80, continue current meds  5. Hyperlipidemia - LDL above goal of 70, she is resistant to increase crestor at this time - cuntinue current statin.      F/u 6 months      Arnoldo Lenis, M.D., F.A.C.C.

## 2018-01-29 ENCOUNTER — Other Ambulatory Visit: Payer: Self-pay | Admitting: *Deleted

## 2018-01-29 DIAGNOSIS — E1151 Type 2 diabetes mellitus with diabetic peripheral angiopathy without gangrene: Secondary | ICD-10-CM

## 2018-01-29 DIAGNOSIS — I739 Peripheral vascular disease, unspecified: Secondary | ICD-10-CM

## 2018-01-29 DIAGNOSIS — I5031 Acute diastolic (congestive) heart failure: Secondary | ICD-10-CM

## 2018-01-29 DIAGNOSIS — I2510 Atherosclerotic heart disease of native coronary artery without angina pectoris: Secondary | ICD-10-CM

## 2018-01-29 MED ORDER — CLOPIDOGREL BISULFATE 75 MG PO TABS: 75.0000 mg | ORAL_TABLET | Freq: Every day | ORAL | 0 refills | Status: DC

## 2018-02-03 DIAGNOSIS — D239 Other benign neoplasm of skin, unspecified: Secondary | ICD-10-CM | POA: Diagnosis not present

## 2018-02-03 DIAGNOSIS — Z85828 Personal history of other malignant neoplasm of skin: Secondary | ICD-10-CM | POA: Diagnosis not present

## 2018-02-08 DIAGNOSIS — Z1211 Encounter for screening for malignant neoplasm of colon: Secondary | ICD-10-CM | POA: Diagnosis not present

## 2018-02-08 DIAGNOSIS — Z1212 Encounter for screening for malignant neoplasm of rectum: Secondary | ICD-10-CM | POA: Diagnosis not present

## 2018-02-11 DIAGNOSIS — H25812 Combined forms of age-related cataract, left eye: Secondary | ICD-10-CM | POA: Diagnosis not present

## 2018-02-11 DIAGNOSIS — E119 Type 2 diabetes mellitus without complications: Secondary | ICD-10-CM | POA: Diagnosis not present

## 2018-02-11 DIAGNOSIS — H26491 Other secondary cataract, right eye: Secondary | ICD-10-CM | POA: Diagnosis not present

## 2018-02-12 LAB — COLOGUARD: Cologuard: NEGATIVE

## 2018-02-21 ENCOUNTER — Other Ambulatory Visit: Payer: Self-pay | Admitting: Family

## 2018-03-03 ENCOUNTER — Other Ambulatory Visit: Payer: Self-pay | Admitting: Family

## 2018-03-03 DIAGNOSIS — E1159 Type 2 diabetes mellitus with other circulatory complications: Secondary | ICD-10-CM

## 2018-03-03 DIAGNOSIS — I2510 Atherosclerotic heart disease of native coronary artery without angina pectoris: Secondary | ICD-10-CM

## 2018-03-03 DIAGNOSIS — E876 Hypokalemia: Secondary | ICD-10-CM

## 2018-03-03 DIAGNOSIS — I152 Hypertension secondary to endocrine disorders: Secondary | ICD-10-CM

## 2018-03-03 DIAGNOSIS — I10 Essential (primary) hypertension: Secondary | ICD-10-CM

## 2018-03-03 DIAGNOSIS — I5032 Chronic diastolic (congestive) heart failure: Secondary | ICD-10-CM

## 2018-03-04 ENCOUNTER — Encounter: Payer: Self-pay | Admitting: *Deleted

## 2018-03-22 ENCOUNTER — Ambulatory Visit: Payer: Medicare Other | Admitting: Family

## 2018-03-22 ENCOUNTER — Other Ambulatory Visit: Payer: Self-pay | Admitting: Family

## 2018-03-23 ENCOUNTER — Ambulatory Visit: Payer: Medicare Other | Admitting: Family

## 2018-04-01 ENCOUNTER — Ambulatory Visit: Payer: Medicare Other | Admitting: Family

## 2018-04-11 ENCOUNTER — Other Ambulatory Visit: Payer: Self-pay | Admitting: Family

## 2018-04-11 DIAGNOSIS — I10 Essential (primary) hypertension: Principal | ICD-10-CM

## 2018-04-11 DIAGNOSIS — I152 Hypertension secondary to endocrine disorders: Secondary | ICD-10-CM

## 2018-04-11 DIAGNOSIS — E1159 Type 2 diabetes mellitus with other circulatory complications: Secondary | ICD-10-CM

## 2018-04-15 ENCOUNTER — Ambulatory Visit: Payer: Medicare Other | Admitting: Family

## 2018-04-20 ENCOUNTER — Ambulatory Visit: Payer: Medicare Other | Admitting: Family

## 2018-04-27 ENCOUNTER — Ambulatory Visit (INDEPENDENT_AMBULATORY_CARE_PROVIDER_SITE_OTHER): Payer: Medicare Other

## 2018-04-27 ENCOUNTER — Other Ambulatory Visit: Payer: Self-pay | Admitting: Family

## 2018-04-27 ENCOUNTER — Ambulatory Visit (INDEPENDENT_AMBULATORY_CARE_PROVIDER_SITE_OTHER): Payer: Medicare Other | Admitting: Family

## 2018-04-27 ENCOUNTER — Encounter: Payer: Self-pay | Admitting: Family

## 2018-04-27 VITALS — BP 142/56 | HR 50 | Temp 97.5°F | Ht 62.5 in | Wt 215.6 lb

## 2018-04-27 DIAGNOSIS — I252 Old myocardial infarction: Secondary | ICD-10-CM | POA: Diagnosis not present

## 2018-04-27 DIAGNOSIS — E1159 Type 2 diabetes mellitus with other circulatory complications: Secondary | ICD-10-CM

## 2018-04-27 DIAGNOSIS — E785 Hyperlipidemia, unspecified: Secondary | ICD-10-CM

## 2018-04-27 DIAGNOSIS — Z780 Asymptomatic menopausal state: Secondary | ICD-10-CM

## 2018-04-27 DIAGNOSIS — I10 Essential (primary) hypertension: Secondary | ICD-10-CM

## 2018-04-27 DIAGNOSIS — E1151 Type 2 diabetes mellitus with diabetic peripheral angiopathy without gangrene: Secondary | ICD-10-CM | POA: Diagnosis not present

## 2018-04-27 DIAGNOSIS — I739 Peripheral vascular disease, unspecified: Secondary | ICD-10-CM | POA: Diagnosis not present

## 2018-04-27 DIAGNOSIS — I5033 Acute on chronic diastolic (congestive) heart failure: Secondary | ICD-10-CM | POA: Diagnosis not present

## 2018-04-27 DIAGNOSIS — M8580 Other specified disorders of bone density and structure, unspecified site: Secondary | ICD-10-CM | POA: Insufficient documentation

## 2018-04-27 DIAGNOSIS — I2510 Atherosclerotic heart disease of native coronary artery without angina pectoris: Secondary | ICD-10-CM

## 2018-04-27 DIAGNOSIS — E1169 Type 2 diabetes mellitus with other specified complication: Secondary | ICD-10-CM | POA: Diagnosis not present

## 2018-04-27 DIAGNOSIS — I152 Hypertension secondary to endocrine disorders: Secondary | ICD-10-CM

## 2018-04-27 DIAGNOSIS — M85851 Other specified disorders of bone density and structure, right thigh: Secondary | ICD-10-CM | POA: Diagnosis not present

## 2018-04-27 LAB — BAYER DCA HB A1C WAIVED: HB A1C (BAYER DCA - WAIVED): 7.8 % — ABNORMAL HIGH (ref <7.0)

## 2018-04-27 NOTE — Progress Notes (Signed)
Subjective:    Patient ID: Leah Ferrell, female    DOB: 1950-07-23, 68 y.o.   MRN: [id number]  Chief Complaint  Patient presents with  . Medical Management of Chronic Issues   Pt presents to the office today for chronic follow up. Pt has CHF, CAD and hx MI and is followed by Cardiologists every 6 months. PT is followed by Vascular for PVD and PAD. PT states these are stable. Diabetes  She presents for her follow-up diabetic visit. She has type 2 diabetes mellitus. Her disease course has been worsening. There are no hypoglycemic associated symptoms. Associated symptoms include fatigue and foot paresthesias. Pertinent negatives for diabetes include no blurred vision and no visual change. There are no hypoglycemic complications. Symptoms are stable. Diabetic complications include heart disease and peripheral neuropathy. Risk factors for coronary artery disease include diabetes mellitus, dyslipidemia, hypertension, sedentary lifestyle and post-menopausal. She is following a generally unhealthy diet. Her overall blood glucose range is 180-200 mg/dl. Eye exam is current.  Hypertension  This is a chronic problem. The current episode started more than 1 year ago. The problem has been waxing and waning since onset. The problem is controlled. Associated symptoms include malaise/fatigue and shortness of breath. Pertinent negatives include no blurred vision or peripheral edema. Risk factors for coronary artery disease include dyslipidemia, diabetes mellitus, obesity and sedentary lifestyle. The current treatment provides moderate improvement. Hypertensive end-organ damage includes CAD/MI and heart failure.  Congestive Heart Failure  Presents for follow-up visit. Associated symptoms include edema, fatigue and shortness of breath. The symptoms have been stable.  Hyperlipidemia  This is a chronic problem. The current episode started more than 1 year ago. The problem is uncontrolled. Recent lipid tests were  reviewed and are normal. Associated symptoms include shortness of breath. Current antihyperlipidemic treatment includes statins. Risk factors for coronary artery disease include dyslipidemia, diabetes mellitus, hypertension, a sedentary lifestyle and post-menopausal.      Review of Systems  Constitutional: Positive for fatigue and malaise/fatigue.  Eyes: Negative for blurred vision.  Respiratory: Positive for shortness of breath.   All other systems reviewed and are negative.      Objective:   Physical Exam Vitals signs reviewed.  Constitutional:      General: She is not in acute distress.    Appearance: She is well-developed.  HENT:     Head: Normocephalic and atraumatic.     Right Ear: Tympanic membrane normal.     Left Ear: Tympanic membrane normal.  Eyes:     Pupils: Pupils are equal, round, and reactive to light.  Neck:     Musculoskeletal: Normal range of motion and neck supple.     Thyroid: No thyromegaly.  Cardiovascular:     Rate and Rhythm: Normal rate and regular rhythm.     Heart sounds: Normal heart sounds. No murmur.  Pulmonary:     Effort: Pulmonary effort is normal. No respiratory distress.     Breath sounds: Normal breath sounds. No wheezing.  Abdominal:     General: Bowel sounds are normal. There is no distension.     Palpations: Abdomen is soft.     Tenderness: There is no abdominal tenderness.  Musculoskeletal: Normal range of motion.        General: No tenderness.  Skin:    General: Skin is warm and dry.  Neurological:     Mental Status: She is alert and oriented to person, place, and time.     Cranial Nerves: No cranial nerve  deficit.     Deep Tendon Reflexes: Reflexes are normal and symmetric.  Psychiatric:        Behavior: Behavior normal.        Thought Content: Thought content normal.        Judgment: Judgment normal.       BP (!) 142/56   Pulse (!) 50   Temp (!) 97.5 F (36.4 C) (Oral)   Ht 5' 2.5" (1.588 m)   Wt 215 lb 9.6 oz  (97.8 kg)   SpO2 95%   BMI 38.81 kg/m      Assessment & Plan:  Leah Ferrell comes in today with chief complaint of Medical Management of Chronic Issues   Diagnosis and orders addressed:  1. Acute on chronic diastolic CHF (congestive heart failure) (HCC) - CBC with Differential/Platelet - CMP14+EGFR - Ambulatory referral to Chronic Care Management Services  2. Coronary artery disease involving native coronary artery of native heart without angina pectoris - CBC with Differential/Platelet - CMP14+EGFR - Ambulatory referral to Chronic Care Management Services  3. Diabetes mellitus with peripheral vascular disease ([place]) - Bayer DCA Hb A1c Waived - CBC with Differential/Platelet - CMP14+EGFR - Ambulatory referral to Chronic Care Management Services  4. Hypertension associated with diabetes ([place]) - CBC with Differential/Platelet - CMP14+EGFR - Ambulatory referral to Chronic Care Management Services  5. PAD (peripheral artery disease) (HCC) - CBC with Differential/Platelet - CMP14+EGFR - Ambulatory referral to Chronic Care Management Services  6. PVD (peripheral vascular disease) ([place]) - CBC with Differential/Platelet - CMP14+EGFR - Ambulatory referral to Chronic Care Management Services  7. Hyperlipidemia associated with type 2 diabetes mellitus ([place]) - CBC with Differential/Platelet - CMP14+EGFR - Lipid panel - Ambulatory referral to Chronic Care Management Services  8. Hx of non-ST elevation myocardial infarction (NSTEMI) - CBC with Differential/Platelet - CMP14+EGFR - Ambulatory referral to Chronic Care Management Services  9. Morbid obesity ([place]) - CBC with Differential/Platelet - CMP14+EGFR - Ambulatory referral to Chronic Care Management Services  10. Post-menopause - DG [facility] DEXA   Labs pending Health Maintenance reviewed Diet and exercise encouraged  Follow up plan: 3 months    Evelina Dun, FNP

## 2018-04-27 NOTE — Patient Instructions (Signed)
Heart Failure °Heart failure is a condition in which the heart has trouble pumping blood because it has become weak or stiff. This means that the heart does not pump blood efficiently for the body to work well. For some people with heart failure, fluid may back up into the lungs and there may be swelling (edema) in the lower legs. Heart failure is usually a long-term (chronic) condition. It is important for you to take good care of yourself and follow the treatment plan from your health care provider. °What are the causes? °This condition is caused by some health problems, including: °· High blood pressure (hypertension). Hypertension causes the heart muscle to work harder than normal. High blood pressure eventually causes the heart to become stiff and weak. °· Coronary artery disease (CAD). CAD is the buildup of cholesterol and fat (plaques) in the arteries of the heart. °· Heart attack (myocardial infarction). Injured tissue, which is caused by the heart attack, does not contract as well and the heart's ability to pump blood is weakened. °· Abnormal heart valves. When the heart valves do not open and close properly, the heart muscle must pump harder to keep the blood flowing. °· Heart muscle disease (cardiomyopathy or myocarditis). Heart muscle disease is damage to the heart muscle from a variety of causes, such as drug or alcohol abuse, infections, or unknown causes. These can increase the risk of heart failure. °· Lung disease. When the lungs do not work properly, the heart must work harder. °What increases the risk? °Risk of heart failure increases as a person ages. This condition is also more likely to develop in people who: °· Are overweight. °· Are female. °· Smoke or chew tobacco. °· Abuse alcohol or illegal drugs. °· Have taken medicines that can damage the heart, such as chemotherapy drugs. °· Have diabetes. °? High blood sugar (glucose) is associated with high fat (lipid) levels in the blood. °? Diabetes  can also damage tiny blood vessels that carry nutrients to the heart muscle. °· Have abnormal heart rhythms. °· Have thyroid problems. °· Have low blood counts (anemia). °What are the signs or symptoms? °Symptoms of this condition include: °· Shortness of breath with activity, such as when climbing stairs. °· Persistent cough. °· Swelling of the feet, ankles, legs, or abdomen. °· Unexplained weight gain. °· Difficulty breathing when lying flat (orthopnea). °· Waking from sleep because of the need to sit up and get more air. °· Rapid heartbeat. °· Fatigue and loss of energy. °· Feeling light-headed, dizzy, or close to fainting. °· Loss of appetite. °· Nausea. °· Increased urination during the night (nocturia). °· Confusion. °How is this diagnosed? °This condition is diagnosed based on: °· Medical history, symptoms, and a physical exam. °· Diagnostic tests, which may include: °? Echocardiogram. °? Electrocardiogram (ECG). °? Chest X-ray. °? Blood tests. °? Exercise stress test. °? Radionuclide scans. °? Cardiac catheterization and angiogram. °How is this treated? °Treatment for this condition is aimed at managing the symptoms of heart failure. Medicines, behavioral changes, or other treatments may be necessary to treat heart failure. °Medicines °These may include: °· Angiotensin-converting enzyme (ACE) inhibitors. This type of medicine blocks the effects of a blood protein called angiotensin-converting enzyme. ACE inhibitors relax (dilate) the blood vessels and help to lower blood pressure. °· Angiotensin receptor blockers (ARBs). This type of medicine blocks the actions of a blood protein called angiotensin. ARBs dilate the blood vessels and help to lower blood pressure. °· Water pills (diuretics). Diuretics cause   the kidneys to remove salt and water from the blood. The extra fluid is removed through urination, leaving a lower volume of blood that the heart has to pump. °· Beta blockers. These improve heart muscle  strength and they prevent the heart from beating too quickly. °· Digoxin. This increases the force of the heartbeat. °Healthy behavior changes °These may include: °· Reaching and maintaining a healthy weight. °· Stopping smoking or chewing tobacco. °· Eating heart-healthy foods. °· Limiting or avoiding alcohol. °· Stopping use of street drugs (illegal drugs). °· Physical activity. °Other treatments °These may include: °· Surgery to open blocked coronary arteries or repair damaged heart valves. °· Placement of a biventricular pacemaker to improve heart muscle function (cardiac resynchronization therapy). This device paces both the right ventricle and left ventricle. °· Placement of a device to treat serious abnormal heart rhythms (implantable cardioverter defibrillator, or ICD). °· Placement of a device to improve the pumping ability of the heart (left ventricular assist device, or LVAD). °· Heart transplant. This can cure heart failure, and it is considered for certain patients who do not improve with other therapies. °Follow these instructions at home: °Medicines °· Take over-the-counter and prescription medicines only as told by your health care provider. Medicines are important in reducing the workload of your heart, slowing the progression of heart failure, and improving your symptoms. °? Do not stop taking your medicine unless your health care provider told you to do that. °? Do not skip any dose of medicine. °? Refill your prescriptions before you run out of medicine. You need your medicines every day. °Eating and drinking ° °· Eat heart-healthy foods. Talk with a dietitian to make an eating plan that is right for you. °? Choose foods that contain no trans fat and are low in saturated fat and cholesterol. Healthy choices include fresh or frozen fruits and vegetables, fish, lean meats, legumes, fat-free or low-fat dairy products, and whole-grain or high-fiber foods. °? Limit salt (sodium) if directed by your  health care provider. Sodium restriction may reduce symptoms of heart failure. Ask a dietitian to recommend heart-healthy seasonings. °? Use healthy cooking methods instead of frying. Healthy methods include roasting, grilling, broiling, baking, poaching, steaming, and stir-frying. °· Limit your fluid intake if directed by your health care provider. Fluid restriction may reduce symptoms of heart failure. °Lifestyle ° °· Stop smoking or using chewing tobacco. Nicotine and tobacco can damage your heart and your blood vessels. Do not use nicotine gum or patches before talking to your health care provider. °· Limit alcohol intake to no more than 1 drink per day for non-pregnant women and 2 drinks per day for men. One drink equals 12 oz of beer, 5 oz of wine, or 1½ oz of hard liquor. °? Drinking more than that is harmful to your heart. Tell your health care provider if you drink alcohol several times a week. °? Talk with your health care provider about whether any level of alcohol use is safe for you. °? If your heart has already been damaged by alcohol or you have severe heart failure, drinking alcohol should be stopped completely. °· Stop use of illegal drugs. °· Lose weight if directed by your health care provider. Weight loss may reduce symptoms of heart failure. °· Do moderate physical activity if directed by your health care provider. People who are elderly and people with severe heart failure should consult with a health care provider for physical activity recommendations. °Monitor important information ° °· Weigh   yourself every day. Keeping track of your weight daily helps you to notice excess fluid sooner. °? Weigh yourself every morning after you urinate and before you eat breakfast. °? Wear the same amount of clothing each time you weigh yourself. °? Record your daily weight. Provide your health care provider with your weight record. °· Monitor and record your blood pressure as told by your health care  provider. °· Check your pulse as told by your health care provider. °Dealing with extreme temperatures °· If the weather is extremely hot: °? Avoid vigorous physical activity. °? Use air conditioning or fans or seek a cooler location. °? Avoid caffeine and alcohol. °? Wear loose-fitting, lightweight, and light-colored clothing. °· If the weather is extremely cold: °? Avoid vigorous physical activity. °? Layer your clothes. °? Wear mittens or gloves, a hat, and a scarf when you go outside. °? Avoid alcohol. °General instructions °· Manage other health conditions such as hypertension, diabetes, thyroid disease, or abnormal heart rhythms as told by your health care provider. °· Learn to manage stress. If you need help to do this, ask your health care provider. °· Plan rest periods when fatigued. °· Get ongoing education and support as needed. °· Participate in or seek rehabilitation as needed to maintain or improve independence and quality of life. °· Stay up to date with immunizations. Keeping current on pneumococcal and influenza immunizations is especially important to prevent respiratory infections. °· Keep all follow-up visits as told by your health care provider. This is important. °Contact a health care provider if: °· You have a rapid weight gain. °· You have increasing shortness of breath that is unusual for you. °· You are unable to participate in your usual physical activities. °· You tire easily. °· You cough more than normal, especially with physical activity. °· You have any swelling or more swelling in areas such as your hands, feet, ankles, or abdomen. °· You are unable to sleep because it is hard to breathe. °· You feel like your heart is beating quickly (palpitations). °· You become dizzy or light-headed when you stand up. °Get help right away if: °· You have difficulty breathing. °· You notice or your family notices a change in your awareness, such as having trouble staying awake or having difficulty  with concentration. °· You have pain or discomfort in your chest. °· You have an episode of fainting (syncope). °This information is not intended to replace advice given to you by your health care provider. Make sure you discuss any questions you have with your health care provider. °Document Released: 02/03/2005 Document Revised: 01/02/2017 Document Reviewed: 08/29/2015 °Elsevier Interactive Patient Education © 2019 [employer] ° °

## 2018-04-28 LAB — CMP14+EGFR
ALBUMIN: 3.9 g/dL (ref 3.8–4.8)
ALT: 16 IU/L (ref 0–32)
AST: 15 IU/L (ref 0–40)
Albumin/Globulin Ratio: 1.2 (ref 1.2–2.2)
Alkaline Phosphatase: 51 IU/L (ref 39–117)
BUN / CREAT RATIO: 14 (ref 12–28)
BUN: 12 mg/dL (ref 8–27)
Bilirubin Total: 0.3 mg/dL (ref 0.0–1.2)
CO2: 24 mmol/L (ref 20–29)
CREATININE: 0.83 mg/dL (ref 0.57–1.00)
Calcium: 8.9 mg/dL (ref 8.7–10.3)
Chloride: 98 mmol/L (ref 96–106)
GFR calc non Af Amer: 73 mL/min/{1.73_m2} (ref >59)
GFR, EST AFRICAN AMERICAN: 84 mL/min/{1.73_m2} (ref >59)
GLUCOSE: 148 mg/dL — AB (ref 65–99)
Globulin, Total: 3.3 g/dL (ref 1.5–4.5)
Potassium: 5.2 mmol/L (ref 3.5–5.2)
Sodium: 138 mmol/L (ref 134–144)
TOTAL PROTEIN: 7.2 g/dL (ref 6.0–8.5)

## 2018-04-28 LAB — CBC WITH DIFFERENTIAL/PLATELET
BASOS ABS: 0.1 10*3/uL (ref 0.0–0.2)
Basos: 1 %
EOS (ABSOLUTE): 0.3 10*3/uL (ref 0.0–0.4)
Eos: 3 %
HEMOGLOBIN: 13.2 g/dL (ref 11.1–15.9)
Hematocrit: 39.5 % (ref 34.0–46.6)
IMMATURE GRANS (ABS): 0 10*3/uL (ref 0.0–0.1)
IMMATURE GRANULOCYTES: 0 %
LYMPHS: 29 %
Lymphocytes Absolute: 3.6 10*3/uL — ABNORMAL HIGH (ref 0.7–3.1)
MCH: 30.5 pg (ref 26.6–33.0)
MCHC: 33.4 g/dL (ref 31.5–35.7)
MCV: 91 fL (ref 79–97)
MONOCYTES: 7 %
Monocytes Absolute: 0.8 10*3/uL (ref 0.1–0.9)
NEUTROS ABS: 7.5 10*3/uL — AB (ref 1.4–7.0)
NEUTROS PCT: 60 %
PLATELETS: 277 10*3/uL (ref 150–450)
RBC: 4.33 x10E6/uL (ref 3.77–5.28)
RDW: 14 % (ref 11.7–15.4)
WBC: 12.3 10*3/uL — AB (ref 3.4–10.8)

## 2018-04-28 LAB — LIPID PANEL
CHOLESTEROL TOTAL: 144 mg/dL (ref 100–199)
Chol/HDL Ratio: 2.1 ratio (ref 0.0–4.4)
HDL: 67 mg/dL (ref >39)
LDL CALC: 57 mg/dL (ref 0–99)
Triglycerides: 100 mg/dL (ref 0–149)
VLDL CHOLESTEROL CAL: 20 mg/dL (ref 5–40)

## 2018-05-09 ENCOUNTER — Other Ambulatory Visit: Payer: Self-pay | Admitting: Family

## 2018-05-09 DIAGNOSIS — I2510 Atherosclerotic heart disease of native coronary artery without angina pectoris: Secondary | ICD-10-CM

## 2018-05-09 DIAGNOSIS — I739 Peripheral vascular disease, unspecified: Secondary | ICD-10-CM

## 2018-05-09 DIAGNOSIS — I5031 Acute diastolic (congestive) heart failure: Secondary | ICD-10-CM

## 2018-05-09 DIAGNOSIS — E1151 Type 2 diabetes mellitus with diabetic peripheral angiopathy without gangrene: Secondary | ICD-10-CM

## 2018-05-13 ENCOUNTER — Ambulatory Visit: Payer: Medicare Other | Admitting: *Deleted

## 2018-05-13 DIAGNOSIS — I10 Essential (primary) hypertension: Secondary | ICD-10-CM

## 2018-05-13 DIAGNOSIS — I739 Peripheral vascular disease, unspecified: Secondary | ICD-10-CM

## 2018-05-13 DIAGNOSIS — E1151 Type 2 diabetes mellitus with diabetic peripheral angiopathy without gangrene: Secondary | ICD-10-CM

## 2018-05-13 DIAGNOSIS — I2510 Atherosclerotic heart disease of native coronary artery without angina pectoris: Secondary | ICD-10-CM

## 2018-05-13 DIAGNOSIS — I152 Hypertension secondary to endocrine disorders: Secondary | ICD-10-CM

## 2018-05-13 DIAGNOSIS — I5032 Chronic diastolic (congestive) heart failure: Secondary | ICD-10-CM

## 2018-05-13 DIAGNOSIS — E1159 Type 2 diabetes mellitus with other circulatory complications: Secondary | ICD-10-CM

## 2018-05-13 DIAGNOSIS — I252 Old myocardial infarction: Secondary | ICD-10-CM

## 2018-05-13 NOTE — Chronic Care Management (AMB) (Addendum)
  Chronic Care Management   Initial Telephone Outreach  05/13/2018 Name: Leah Ferrell MRN: [id number] DOB: 1950-06-13  Referred by: Sharion Balloon, FNP Reason for referral : Chronic Care Management (Outreach to offer CCM services)   Leah Ferrell is a 68 y.o. year old female who is a primary care patient of Sharion Balloon, FNP. The CCM team was consulted for assistance with chronic disease management and care coordination needs related to CHF, CAD, DM, peripheral vascular disease, HTN, PAD, hyperlipidemia, morbid obesity, hx of NSTEMI.    Review of patient status, including review of consultants reports, relevant laboratory and other test results, and collaboration with appropriate care team members and the patient's provider was performed as part of comprehensive patient evaluation and provision of chronic care management services.    Leah Ferrell was given information about Chronic Care Management services today including:  1. CCM service includes personalized support from designated clinical staff supervised by her physician, including individualized plan of care and coordination with other care providers 2. 24/7 contact phone numbers for assistance for urgent and routine care needs. 3. Service will only be billed when office clinical staff spend 20 minutes or more in a month to coordinate care. 4. Only one practitioner may furnish and bill the service in a calendar month. 5. The patient may stop CCM services at any time (effective at the end of the month) by phone call to the office staff. 6. The patient will be responsible for cost sharing (co-pay) of up to 20% of the service fee (after annual deductible is met). She has [employer].  Patient did not agree to services and wishes to consider information provided before deciding about enrollment in CCM services. She requested that I call her next week to discuss CCM services.   The CM team will reach out to the patient  again over the next 7 days.   Chong Sicilian, RN-BC, BSN Nurse Case Manager [facility] [phone]   I have reviewed and agree with the above  documentation.   Evelina Dun, FNP

## 2018-05-17 ENCOUNTER — Telehealth: Payer: Medicare Other | Admitting: *Deleted

## 2018-06-09 ENCOUNTER — Ambulatory Visit: Payer: Medicare Other | Admitting: *Deleted

## 2018-06-16 ENCOUNTER — Other Ambulatory Visit: Payer: Self-pay | Admitting: Family

## 2018-06-16 DIAGNOSIS — I5031 Acute diastolic (congestive) heart failure: Secondary | ICD-10-CM

## 2018-06-16 DIAGNOSIS — I10 Essential (primary) hypertension: Secondary | ICD-10-CM

## 2018-06-16 DIAGNOSIS — E1159 Type 2 diabetes mellitus with other circulatory complications: Secondary | ICD-10-CM

## 2018-06-16 DIAGNOSIS — E1151 Type 2 diabetes mellitus with diabetic peripheral angiopathy without gangrene: Secondary | ICD-10-CM

## 2018-06-16 DIAGNOSIS — I152 Hypertension secondary to endocrine disorders: Secondary | ICD-10-CM

## 2018-06-16 DIAGNOSIS — E1169 Type 2 diabetes mellitus with other specified complication: Secondary | ICD-10-CM

## 2018-06-16 DIAGNOSIS — I739 Peripheral vascular disease, unspecified: Secondary | ICD-10-CM

## 2018-06-16 DIAGNOSIS — E785 Hyperlipidemia, unspecified: Principal | ICD-10-CM

## 2018-06-16 DIAGNOSIS — E876 Hypokalemia: Secondary | ICD-10-CM

## 2018-06-16 DIAGNOSIS — I2510 Atherosclerotic heart disease of native coronary artery without angina pectoris: Secondary | ICD-10-CM

## 2018-06-16 DIAGNOSIS — I5032 Chronic diastolic (congestive) heart failure: Secondary | ICD-10-CM

## 2018-06-25 ENCOUNTER — Telehealth: Payer: Self-pay

## 2018-06-25 NOTE — Telephone Encounter (Signed)

## 2018-06-28 ENCOUNTER — Telehealth: Payer: Self-pay | Admitting: Cardiovascular Disease

## 2018-06-29 ENCOUNTER — Other Ambulatory Visit: Payer: Self-pay | Admitting: *Deleted

## 2018-06-29 ENCOUNTER — Telehealth (INDEPENDENT_AMBULATORY_CARE_PROVIDER_SITE_OTHER): Payer: Medicare Other | Admitting: Cardiovascular Disease

## 2018-06-29 ENCOUNTER — Encounter: Payer: Self-pay | Admitting: Cardiovascular Disease

## 2018-06-29 VITALS — BP 139/44 | HR 63 | Ht 62.5 in | Wt 212.0 lb

## 2018-06-29 DIAGNOSIS — I152 Hypertension secondary to endocrine disorders: Secondary | ICD-10-CM

## 2018-06-29 DIAGNOSIS — I739 Peripheral vascular disease, unspecified: Secondary | ICD-10-CM

## 2018-06-29 DIAGNOSIS — E1159 Type 2 diabetes mellitus with other circulatory complications: Secondary | ICD-10-CM

## 2018-06-29 DIAGNOSIS — I2510 Atherosclerotic heart disease of native coronary artery without angina pectoris: Secondary | ICD-10-CM

## 2018-06-29 MED ORDER — METOPROLOL TARTRATE 100 MG PO TABS: 50.0000 mg | ORAL_TABLET | Freq: Two times a day (BID) | ORAL | 3 refills | Status: DC

## 2018-06-29 MED ORDER — INSULIN LISPRO 200 UNIT/ML ~~LOC~~ SOPN: 50.0000 [IU] | PEN_INJECTOR | Freq: Three times a day (TID) | SUBCUTANEOUS | 3 refills | Status: DC

## 2018-06-29 NOTE — Telephone Encounter (Signed)
Pt needs new Rx for her Humalog Kwikpen to [facility]  She is taking 50 units TID now Needs 3 mos supply sent

## 2018-06-29 NOTE — Progress Notes (Signed)
Virtual Visit via Telephone Note   This visit type was conducted due to national recommendations for restrictions regarding the COVID-19 Pandemic (e.g. social distancing) in an effort to limit this patient's exposure and mitigate transmission in our community.  Due to her co-morbid illnesses, this patient is at least at moderate risk for complications without adequate follow up.  This format is felt to be most appropriate for this patient at this time.  The patient did not have access to video technology/had technical difficulties with video requiring transitioning to audio format only (telephone).  All issues noted in this document were discussed and addressed.  No physical exam could be performed with this format.  Please refer to the patient's chart for her  consent to telehealth for [facility].   Date:  06/29/2018   ID:  Leah Ferrell, DOB 1950-05-23, MRN [id number]  Patient Location: Home Provider Location: Office  PCP:  Sharion Balloon, FNP  Cardiologist:  Carlyle Dolly, MD  Electrophysiologist:  None   Evaluation Performed:  Follow-Up Visit  Chief Complaint: Slow heartbeat and fatigue  History of Present Illness:    Leah Ferrell is a 68 y.o. female was reached via phone for follow-up visit regarding peripheral arterial disease.  She has known history of coronary artery disease status post drug-eluting stent placement to the LAD X 2 Most recently in 2015 for in-stent restenosis.  She is known to have peripheral arterial disease status post left common iliac artery stent placement followed by left common femoral artery resection and bypass from left external iliac to the left profunda done by Dr. Kellie Simmering in 2015. She is known to have chronically occluded left SFA being managed medically.  She had worsening left leg claudication in 2018 with elevated velocity at the proximal anastomosis of the graft.   Angiography in March 2018 showed mild to moderate calcified common  iliac artery disease with patent stent in the left common iliac artery. There was severe stenosis in the left common femoral artery at the proximal anastomosis of the bypass to the profunda. I performed successful angioplasty and drug-coated balloon angioplasty.  She was hospitalized in October 5188 for diastolic heart failure.  Echocardiogram showed an EF of 65 to 70% with grade 2 diastolic dysfunction.  Carotid Doppler showed moderate left carotid stenosis.  Lexiscan Myoview in November 2019 showed small distal anterior scar without ischemia with normal ejection fraction.  She has been doing reasonably well overall and denies chest pain or worsening dyspnea.  She reports stable left leg claudication.  However, she is complaining of slow heartbeats with heart rate occasionally going down to the 40s.  This is usually associated with fatigue and dizziness.  No syncope.  The patient does not have symptoms concerning for COVID-19 infection (fever, chills, cough, or new shortness of breath).    Past Medical History:  Diagnosis Date   CAD (coronary artery disease)    a. 04/2012 NSTEMI: s/p DES to LAD.   Chronic diastolic CHF (congestive heart failure) (HCC)    Diabetes mellitus without complication (HCC)    Dysrhythmia    Environmental and seasonal allergies    Hyperlipidemia    Hypertension    Leukocytosis    Morbid obesity (HCC)    NSTEMI (non-ST elevated myocardial infarction) ([place]) 04/27/2012   PONV (postoperative nausea and vomiting)    PVD (peripheral vascular disease) ([place])    a. 04/2012 ABI: R 0.49, L 0.39. Angiography 06/14: Significant ostial left common iliac artery stenosis extending  into the distal aorta, severe left common femoral artery stenosis, bilateral SFA occlusion with heavy calcifications. Functionally, one-vessel runoff bilaterally below the knee   Shortness of breath    Past Surgical History:  Procedure Laterality Date   ABDOMINAL AORTAGRAM N/A 07/21/2012    Procedure: ABDOMINAL Maxcine Ham;  Surgeon: Wellington Hampshire, MD;  Location: [place] CATH LAB;  Service: Cardiovascular;  Laterality: N/A;   CARDIAC CATHETERIZATION     stents X 2   CORONARY ANGIOGRAM  04/27/2012   Procedure: CORONARY ANGIOGRAM;  Surgeon: Thayer Headings, MD;  Location: [facility] CATH LAB;  Service: Cardiovascular;;   ENDARTERECTOMY FEMORAL Left 11/02/2013   Procedure: RESECTION LEFT COMMON FEMORAL ARTERY AND INSERTION OF INTERPOSITION 70mm HEMASHIELD GRAFT FROM LEFT EXTERNAL  ILIAC ARTERY TO LEFT PROFUNDA  FEMORIS ARTERY;  Surgeon: Mal Misty, MD;  Location: [facility];  Service: Vascular;  Laterality: Left;   EYE SURGERY Right 2017   FRACTURE SURGERY Right Jul 07, 2014   Right Foot-  [place] Home  by Heat duct   ILIAC ARTERY STENT Left 08/31/2013   LAD stent     LEFT HEART CATHETERIZATION WITH CORONARY ANGIOGRAM N/A 04/23/2012   Procedure: LEFT HEART CATHETERIZATION WITH CORONARY ANGIOGRAM;  Surgeon: Peter M Martinique, MD;  Location: [facility] CATH LAB;  Service: Cardiovascular;  Laterality: N/A;   LOWER EXTREMITY ANGIOGRAM Left 08/31/2013   Procedure: LOWER EXTREMITY ANGIOGRAM;  Surgeon: Wellington Hampshire, MD;  Location: [place] CATH LAB;  Service: Cardiovascular;  Laterality: Left;   PERCUTANEOUS STENT INTERVENTION Left 08/31/2013   Procedure: PERCUTANEOUS STENT INTERVENTION;  Surgeon: Wellington Hampshire, MD;  Location: [place] CATH LAB;  Service: Cardiovascular;  Laterality: Left;  Common Iliac artery   PERIPHERAL VASCULAR CATHETERIZATION N/A 03/19/2016   Procedure: Abdominal Aortogram w/Lower Extremity;  Surgeon: Wellington Hampshire, MD;  Location: [place] CV LAB;  Service: Cardiovascular;  Laterality: N/A;   PERIPHERAL VASCULAR CATHETERIZATION  03/19/2016   Procedure: Peripheral Vascular Balloon Angioplasty-CFA Left;  Surgeon: Wellington Hampshire, MD;  Location: [place] CV LAB;  Service: Cardiovascular;;   TUMOR REMOVAL     tumor removed from [place]     Current Meds  Medication Sig    albuterol (PROVENTIL HFA) 108 (90 Base) MCG/ACT inhaler INHALE ONE TO TWO PUFFS BY  MOUTH EVERY 6 HOURS AS  NEEDED FOR WHEEZING OR FOR  SHORTNESS OF BREATH (Patient taking differently: Inhale 1-2 puffs into the lungs every 6 (six) hours as needed for wheezing or shortness of breath. )   amLODipine (NORVASC) 10 MG tablet TAKE 1 TABLET BY MOUTH  DAILY   Ascorbic Acid (VITAMIN C) 1000 MG tablet Take 1,000 mg by mouth 2 (two) times daily.   aspirin EC 81 MG tablet Take 81 mg by mouth every morning.    BD ULTRA-FINE PEN NEEDLES 29G X 12.7MM MISC USE PENNEEDLES 3 TIMES  DAILY AS DIRECTED   Calcium Carbonate-Vit D-Min (CALTRATE 600+D PLUS MINERALS) 600-800 MG-UNIT TABS Take 1 tablet by mouth 3 (three) times daily.    clopidogrel (PLAVIX) 75 MG tablet TAKE 1 TABLET BY MOUTH  DAILY   fish oil-omega-3 fatty acids 1000 MG capsule Take 1 g by mouth every morning.   furosemide (LASIX) 40 MG tablet TAKE 1 TABLET BY MOUTH  DAILY MAY TAKE ADDITIONAL  TABLET AS NEEDED FOR  SWELLING   hydrALAZINE (APRESOLINE) 25 MG tablet Take 1 tablet (25 mg total) by mouth 3 (three) times daily.   hydroxypropyl methylcellulose (ISOPTO TEARS) 2.5 % ophthalmic solution  Place 1 drop into both eyes 3 (three) times daily as needed (for dry eyes).   Insulin Lispro (HUMALOG KWIKPEN) 200 UNIT/ML SOPN Inject 60 Units into the skin 3 (three) times daily before meals. (Patient taking differently: Inject 50 Units into the skin 3 (three) times daily before meals. )   lisinopril (ZESTRIL) 20 MG tablet TAKE 2 TABLETS BY MOUTH  DAILY   metoprolol tartrate (LOPRESSOR) 100 MG tablet Take 1 tablet (100 mg total) by mouth 2 (two) times daily.   montelukast (SINGULAIR) 10 MG tablet TAKE 1 TABLET BY MOUTH  DAILY WITH BREAKFAST   nitroGLYCERIN (NITROSTAT) 0.4 MG SL tablet Place 1 tablet (0.4 mg total) under the tongue every 5 (five) minutes x 3 doses as needed for chest pain (up to 3 doses).   potassium chloride SA (K-DUR) 20 MEQ tablet  TAKE 1 TABLET BY MOUTH  DAILY   RELION PEN NEEDLES 29G X 12MM MISC USE 1 NEEDLE TO INJECT INSULIN SUBCUTANEOUSLY 3 TIMES DAILY   rosuvastatin (CRESTOR) 40 MG tablet TAKE 1 TABLET BY MOUTH  DAILY     Allergies:   Tape   Social History   Tobacco Use   Smoking status: Former Smoker    Packs/day: 1.00    Years: 36.00    Pack years: 36.00    Types: Cigarettes    Start date: 04/21/1964    Last attempt to quit: 02/17/2001    Years since quitting: 17.3   Smokeless tobacco: Never Used  Substance Use Topics   Alcohol use: No    Alcohol/week: 0.0 standard drinks   Drug use: No     Family Hx: The patient's family history includes Alcohol abuse in her brother; Diabetes in her brother, daughter, maternal grandmother, mother, and son; Heart attack in her maternal grandfather; Heart disease in her brother; Hyperlipidemia in her mother and son; Hypertension in her daughter, mother, and son; Other in her sister and sister; Peripheral vascular disease in her mother.  ROS:   Please see the history of present illness.     All other systems reviewed and are negative.   Prior CV studies:   The following studies were reviewed today:  Reviewed most recent vascular and echo studies.  Labs/Other Tests and Data Reviewed:    EKG:  No ECG reviewed.  Recent Labs: 12/01/2017: B Natriuretic Peptide 157.0 12/03/2017: Magnesium 2.3 04/27/2018: ALT 16; BUN 12; Creatinine, Ser 0.83; Hemoglobin 13.2; Platelets 277; Potassium 5.2; Sodium 138   Recent Lipid Panel Lab Results  Component Value Date/Time   CHOL 144 04/27/2018 12:57 PM   TRIG 100 04/27/2018 12:57 PM   TRIG 135 09/16/2012 01:22 PM   HDL 67 04/27/2018 12:57 PM   HDL 60 09/16/2012 01:22 PM   CHOLHDL 2.1 04/27/2018 12:57 PM   CHOLHDL 3.2 05/06/2012 05:13 PM   LDLCALC 57 04/27/2018 12:57 PM   LDLCALC 92 09/16/2012 01:22 PM    Wt Readings from Last 3 Encounters:  06/29/18 212 lb (96.2 kg)  04/27/18 215 lb 9.6 oz (97.8 kg)  12/22/17  216 lb 6.4 oz (98.2 kg)     Objective:    Vital Signs:  BP (!) 139/44    Pulse 63    Ht 5' 2.5" (1.588 m)    Wt 212 lb (96.2 kg)    BMI 38.16 kg/m    VITAL SIGNS:  reviewed  ASSESSMENT & PLAN:     1.  Peripheral arterial disease: Stable bilateral leg claudication.  Most recent vascular studies in September  showed stable ABI of 0.5 range.  Repeat vascular studies in September of this year  2. Coronary artery disease involving native coronary arteries:  No anginal symptoms at the present time.  She seems to be having symptomatic bradycardia likely due to high-dose metoprolol.  I elected to decrease the dose to 50 mg twice daily.  We should consider switching her to carvedilol in the future.  3. Essential hypertension: I decrease metoprolol as outlined above.  4. Hyperlipidemia: Continue high-dose rosuvastatin.  Most recent lipid profile showed an LDL of 63.  5.  Chronic diastolic heart failure: She seems to be euvolemic by symptoms on current dose of furosemide.   COVID-19 Education: The signs and symptoms of COVID-19 were discussed with the patient and how to seek care for testing (follow up with PCP or arrange E-visit).  The importance of social distancing was discussed today.  Time:   Today, I have spent 20 minutes with the patient with telehealth technology discussing the above problems.     Medication Adjustments/Labs and Tests Ordered: Current medicines are reviewed at length with the patient today.  Concerns regarding medicines are outlined above.   Tests Ordered: No orders of the defined types were placed in this encounter.   Medication Changes: No orders of the defined types were placed in this encounter.   Disposition:  Follow up in 4 month(s)  Signed, [name], MD  06/29/2018 11:24 AM    [facility]

## 2018-06-29 NOTE — Patient Instructions (Signed)
Medication Instructions:  Decrease metoprolol to 50 mg twice daily (half a tablet of 100 mg twice daily)   If you need a refill on your cardiac medications before your next appointment, please call your pharmacy.   Lab work: None If you have labs (blood work) drawn today and your tests are completely normal, you will receive your results only by: Marland Kitchen MyChart Message (if you have MyChart) OR . A paper copy in the mail If you have any lab test that is abnormal or we need to change your treatment, we will call you to review the results.  Testing/Procedures: None  Follow-Up: Office follow-up with Dr. Fletcher [name] in 4 months

## 2018-07-07 ENCOUNTER — Other Ambulatory Visit: Payer: Self-pay | Admitting: *Deleted

## 2018-07-07 ENCOUNTER — Telehealth: Payer: Self-pay | Admitting: Family

## 2018-07-07 DIAGNOSIS — E876 Hypokalemia: Secondary | ICD-10-CM

## 2018-07-07 MED ORDER — POTASSIUM CHLORIDE CRYS ER 20 MEQ PO TBCR: 20.0000 meq | EXTENDED_RELEASE_TABLET | Freq: Every day | ORAL | 1 refills | Status: DC

## 2018-07-07 MED ORDER — INSULIN LISPRO 200 UNIT/ML ~~LOC~~ SOPN: 50.0000 [IU] | PEN_INJECTOR | Freq: Three times a day (TID) | SUBCUTANEOUS | 3 refills | Status: DC

## 2018-07-07 NOTE — Telephone Encounter (Signed)
Aware sample is ready

## 2018-07-07 NOTE — Progress Notes (Signed)
Per pt, insulin will run out tomorrow Mail order shipment will not be in until 07/13/2018 Short supply sent to [facility] in [facility] per [employer]

## 2018-07-07 NOTE — Telephone Encounter (Signed)
Attempted to contact patient - NA. Sample in [place]

## 2018-07-07 NOTE — Telephone Encounter (Signed)
No samples- covering PCP- please advise

## 2018-07-07 NOTE — Telephone Encounter (Signed)
I sent a refill of potassium for the patient, Leah Ferrell has a sample of Humalog for the patient.

## 2018-07-08 ENCOUNTER — Other Ambulatory Visit: Payer: Self-pay | Admitting: Family

## 2018-07-09 NOTE — Telephone Encounter (Signed)
Medication is not listed on patient's med list

## 2018-07-29 ENCOUNTER — Ambulatory Visit: Payer: Medicare Other | Admitting: Family

## 2018-08-11 ENCOUNTER — Ambulatory Visit: Payer: Medicare Other | Admitting: *Deleted

## 2018-08-11 ENCOUNTER — Other Ambulatory Visit: Payer: Self-pay

## 2018-08-11 DIAGNOSIS — I152 Hypertension secondary to endocrine disorders: Secondary | ICD-10-CM

## 2018-08-11 DIAGNOSIS — I5032 Chronic diastolic (congestive) heart failure: Secondary | ICD-10-CM

## 2018-08-11 DIAGNOSIS — E1151 Type 2 diabetes mellitus with diabetic peripheral angiopathy without gangrene: Secondary | ICD-10-CM

## 2018-08-11 DIAGNOSIS — E1159 Type 2 diabetes mellitus with other circulatory complications: Secondary | ICD-10-CM

## 2018-08-12 ENCOUNTER — Ambulatory Visit: Payer: Medicare Other | Admitting: Family

## 2018-08-12 NOTE — Chronic Care Management (AMB) (Addendum)
  Care Management Note  08/11/2018 Leah Ferrell is a 68 y.o. year old female who is a primary care patient of Sharion Balloon, FNP. The CM team was consulted for assistance with chronic disease management and care coordination.   I reached out to [name] by phone today.   [name] was given information about Chronic Care Management services today including:  1. CCM service includes personalized support from designated clinical staff supervised by her physician, including individualized plan of care and coordination with other care providers 2. 24/7 contact phone numbers for assistance for urgent and routine care needs. 3. Service will only be billed when office clinical staff spend 20 minutes or more in a month to coordinate care. 4. Only one practitioner may furnish and bill the service in a calendar month. 5. The patient may stop CCM services at any time (effective at the end of the month) by phone call to the office staff. 6. The patient will be responsible for cost sharing (co-pay) of up to 20% of the service fee (after annual deductible is met). Patient did not agree to enrollment in care management services and does not wish to consider at this time.    Follow Up Plan: The care management team is available to follow up with the patient after provider conversation with the patient regarding recommendation for care management engagement and subsequent re-referral to the care management team.   Chong Sicilian, RN-BC, BSN Nurse Care Manager [name] [phone]   I have reviewed and agree with the above  documentation.   Evelina Dun, FNP

## 2018-08-17 NOTE — Telephone Encounter (Signed)
Opened in error

## 2018-08-22 ENCOUNTER — Other Ambulatory Visit: Payer: Self-pay | Admitting: Family

## 2018-08-22 DIAGNOSIS — E1159 Type 2 diabetes mellitus with other circulatory complications: Secondary | ICD-10-CM

## 2018-08-22 DIAGNOSIS — I152 Hypertension secondary to endocrine disorders: Secondary | ICD-10-CM

## 2018-08-28 ENCOUNTER — Other Ambulatory Visit: Payer: Self-pay | Admitting: Family

## 2018-09-01 ENCOUNTER — Other Ambulatory Visit: Payer: Self-pay

## 2018-09-02 ENCOUNTER — Ambulatory Visit (INDEPENDENT_AMBULATORY_CARE_PROVIDER_SITE_OTHER): Payer: Medicare Other | Admitting: Family

## 2018-09-02 ENCOUNTER — Encounter: Payer: Self-pay | Admitting: Family

## 2018-09-02 VITALS — BP 151/64 | HR 68 | Temp 97.3°F | Ht 62.5 in | Wt 211.0 lb

## 2018-09-02 DIAGNOSIS — I739 Peripheral vascular disease, unspecified: Secondary | ICD-10-CM | POA: Diagnosis not present

## 2018-09-02 DIAGNOSIS — E785 Hyperlipidemia, unspecified: Secondary | ICD-10-CM

## 2018-09-02 DIAGNOSIS — E1169 Type 2 diabetes mellitus with other specified complication: Secondary | ICD-10-CM

## 2018-09-02 DIAGNOSIS — I5033 Acute on chronic diastolic (congestive) heart failure: Secondary | ICD-10-CM

## 2018-09-02 DIAGNOSIS — I5032 Chronic diastolic (congestive) heart failure: Secondary | ICD-10-CM

## 2018-09-02 DIAGNOSIS — I152 Hypertension secondary to endocrine disorders: Secondary | ICD-10-CM

## 2018-09-02 DIAGNOSIS — E1159 Type 2 diabetes mellitus with other circulatory complications: Secondary | ICD-10-CM | POA: Diagnosis not present

## 2018-09-02 DIAGNOSIS — I10 Essential (primary) hypertension: Secondary | ICD-10-CM | POA: Diagnosis not present

## 2018-09-02 DIAGNOSIS — I2510 Atherosclerotic heart disease of native coronary artery without angina pectoris: Secondary | ICD-10-CM

## 2018-09-02 DIAGNOSIS — E876 Hypokalemia: Secondary | ICD-10-CM

## 2018-09-02 DIAGNOSIS — I252 Old myocardial infarction: Secondary | ICD-10-CM | POA: Diagnosis not present

## 2018-09-02 DIAGNOSIS — E1151 Type 2 diabetes mellitus with diabetic peripheral angiopathy without gangrene: Secondary | ICD-10-CM

## 2018-09-02 LAB — BAYER DCA HB A1C WAIVED: HB A1C (BAYER DCA - WAIVED): 7.6 % — ABNORMAL HIGH (ref <7.0)

## 2018-09-02 MED ORDER — POTASSIUM CHLORIDE CRYS ER 20 MEQ PO TBCR: EXTENDED_RELEASE_TABLET | ORAL | 1 refills | Status: DC

## 2018-09-02 NOTE — Progress Notes (Signed)
Subjective:    Patient ID: Leah Ferrell, female    DOB: 11/03/1950, 68 y.o.   MRN: [id number]  Chief Complaint  Patient presents with   Medical Management of Chronic Issues   Diabetes   Pt presents to the office today for chronic follow up. Pt has CHF, CAD and hx MI and is followed by Cardiologists every 6 months. PT is followed by Vascular for PVD and PAD. PT states these are stable. Diabetes She presents for her follow-up diabetic visit. She has type 2 diabetes mellitus. Her disease course has been stable. There are no hypoglycemic associated symptoms. Associated symptoms include foot paresthesias. Pertinent negatives for diabetes include no blurred vision, no fatigue and no visual change. There are no hypoglycemic complications. Symptoms are stable. Diabetic complications include heart disease, nephropathy and peripheral neuropathy. Risk factors for coronary artery disease include dyslipidemia, diabetes mellitus, hypertension, sedentary lifestyle and post-menopausal. She is following a generally unhealthy diet. Her overall blood glucose range is 180-200 mg/dl.  Hyperlipidemia This is a chronic problem. The current episode started more than 1 year ago. The problem is controlled. Recent lipid tests were reviewed and are normal. Exacerbating diseases include obesity. Current antihyperlipidemic treatment includes statins. The current treatment provides moderate improvement of lipids. Risk factors for coronary artery disease include dyslipidemia, hypertension, a sedentary lifestyle and post-menopausal.  Hypertension This is a chronic problem. The current episode started more than 1 year ago. The problem has been waxing and waning since onset. The problem is controlled. Associated symptoms include malaise/fatigue and peripheral edema. Pertinent negatives include no blurred vision. Risk factors for coronary artery disease include dyslipidemia, diabetes mellitus, obesity and sedentary lifestyle.  Past treatments include central alpha agonists. Hypertensive end-organ damage includes CAD/MI.  Congestive Heart Failure Presents for follow-up visit. Pertinent negatives include no fatigue, nocturia or unexpected weight change. The symptoms have been stable.      Review of Systems  Constitutional: Positive for malaise/fatigue. Negative for fatigue and unexpected weight change.  Eyes: Negative for blurred vision.  Genitourinary: Negative for nocturia.  All other systems reviewed and are negative.      Objective:   Physical Exam Vitals signs reviewed.  Constitutional:      General: She is not in acute distress.    Appearance: She is well-developed. She is obese.  HENT:     Head: Normocephalic and atraumatic.     Right Ear: External ear normal.  Eyes:     Pupils: Pupils are equal, round, and reactive to light.  Neck:     Musculoskeletal: Normal range of motion and neck supple.     Thyroid: No thyromegaly.  Cardiovascular:     Rate and Rhythm: Normal rate and regular rhythm.     Heart sounds: Normal heart sounds. No murmur.  Pulmonary:     Effort: Pulmonary effort is normal. No respiratory distress.     Breath sounds: Normal breath sounds. No wheezing.  Abdominal:     General: Bowel sounds are normal. There is no distension.     Palpations: Abdomen is soft.     Tenderness: There is no abdominal tenderness.  Musculoskeletal: Normal range of motion.        General: No tenderness.     Right lower leg: Edema (trace) present.     Left lower leg: Edema (trace) present.  Skin:    General: Skin is warm and dry.  Neurological:     Mental Status: She is alert and oriented to person, place, and  time.     Cranial Nerves: No cranial nerve deficit.     Deep Tendon Reflexes: Reflexes are normal and symmetric.  Psychiatric:        Behavior: Behavior normal.        Thought Content: Thought content normal.        Judgment: Judgment normal.     See diabetic foot note  BP (!)  151/64    Pulse 68    Temp (!) 97.3 F (36.3 C) (Oral)    Ht 5' 2.5" (1.588 m)    Wt 211 lb (95.7 kg)    BMI 37.98 kg/m      Assessment & Plan:  Leah Ferrell comes in today with chief complaint of Medical Management of Chronic Issues and Diabetes   Diagnosis and orders addressed:  1. PVD (peripheral vascular disease) (HCC) - CMP14+EGFR - CBC with Differential/Platelet  2. PAD (peripheral artery disease) (HCC) - CMP14+EGFR - CBC with Differential/Platelet  3. Hypertension associated with diabetes ([place]) - CMP14+EGFR - CBC with Differential/Platelet  4. Diabetes mellitus with peripheral vascular disease ([place]) - Bayer DCA Hb A1c Waived - CMP14+EGFR - CBC with Differential/Platelet - Microalbumin / creatinine urine ratio  5. Chronic diastolic heart failure (HCC) - CMP14+EGFR - CBC with Differential/Platelet  6. Coronary artery disease involving native coronary artery of native heart without angina pectoris - CMP14+EGFR - CBC with Differential/Platelet  7. Acute on chronic diastolic CHF (congestive heart failure) (HCC) - CMP14+EGFR - CBC with Differential/Platelet  8. Hyperlipidemia associated with type 2 diabetes mellitus (HCC) - CMP14+EGFR - CBC with Differential/Platelet - Lipid panel  9. Morbid obesity ([place]) - CMP14+EGFR - CBC with Differential/Platelet  10. Hx of non-ST elevation myocardial infarction (NSTEMI) - CMP14+EGFR - CBC with Differential/Platelet  11. Hypokalemia - potassium chloride SA (K-DUR) 20 MEQ tablet; Take 20 meq daily, take extra 10 meq if you take extra lasix  Dispense: 90 tablet; Refill: 1   Labs pending Health Maintenance reviewed Diet and exercise encouraged  Follow up plan: 3 months    Evelina Dun, FNP

## 2018-09-02 NOTE — Patient Instructions (Signed)

## 2018-09-03 LAB — LIPID PANEL
Chol/HDL Ratio: 2.1 ratio (ref 0.0–4.4)
Cholesterol, Total: 141 mg/dL (ref 100–199)
HDL: 66 mg/dL (ref >39)
LDL Calculated: 58 mg/dL (ref 0–99)
Triglycerides: 86 mg/dL (ref 0–149)
VLDL Cholesterol Cal: 17 mg/dL (ref 5–40)

## 2018-09-03 LAB — CMP14+EGFR
ALT: 21 IU/L (ref 0–32)
AST: 19 IU/L (ref 0–40)
Albumin/Globulin Ratio: 1.4 (ref 1.2–2.2)
Albumin: 4.2 g/dL (ref 3.8–4.8)
Alkaline Phosphatase: 57 IU/L (ref 39–117)
BUN/Creatinine Ratio: 20 (ref 12–28)
BUN: 17 mg/dL (ref 8–27)
Bilirubin Total: 0.2 mg/dL (ref 0.0–1.2)
CO2: 24 mmol/L (ref 20–29)
Calcium: 9.3 mg/dL (ref 8.7–10.3)
Chloride: 98 mmol/L (ref 96–106)
Creatinine, Ser: 0.85 mg/dL (ref 0.57–1.00)
GFR calc Af Amer: 82 mL/min/{1.73_m2} (ref >59)
GFR calc non Af Amer: 71 mL/min/{1.73_m2} (ref >59)
Globulin, Total: 3 g/dL (ref 1.5–4.5)
Glucose: 166 mg/dL — ABNORMAL HIGH (ref 65–99)
Potassium: 4.5 mmol/L (ref 3.5–5.2)
Sodium: 138 mmol/L (ref 134–144)
Total Protein: 7.2 g/dL (ref 6.0–8.5)

## 2018-09-03 LAB — CBC WITH DIFFERENTIAL/PLATELET
Basophils Absolute: 0.1 10*3/uL (ref 0.0–0.2)
Basos: 0 %
EOS (ABSOLUTE): 0.3 10*3/uL (ref 0.0–0.4)
Eos: 2 %
Hematocrit: 45.8 % (ref 34.0–46.6)
Hemoglobin: 14.4 g/dL (ref 11.1–15.9)
Immature Grans (Abs): 0 10*3/uL (ref 0.0–0.1)
Immature Granulocytes: 0 %
Lymphocytes Absolute: 3.2 10*3/uL — ABNORMAL HIGH (ref 0.7–3.1)
Lymphs: 29 %
MCH: 29.6 pg (ref 26.6–33.0)
MCHC: 31.4 g/dL — ABNORMAL LOW (ref 31.5–35.7)
MCV: 94 fL (ref 79–97)
Monocytes Absolute: 0.7 10*3/uL (ref 0.1–0.9)
Monocytes: 7 %
Neutrophils Absolute: 6.9 10*3/uL (ref 1.4–7.0)
Neutrophils: 62 %
Platelets: 249 10*3/uL (ref 150–450)
RBC: 4.86 x10E6/uL (ref 3.77–5.28)
RDW: 15.3 % (ref 11.7–15.4)
WBC: 11.1 10*3/uL — ABNORMAL HIGH (ref 3.4–10.8)

## 2018-09-03 LAB — MICROALBUMIN / CREATININE URINE RATIO
Creatinine, Urine: 47.7 mg/dL
Microalb/Creat Ratio: 20 mg/g creat (ref 0–29)
Microalbumin, Urine: 9.5 ug/mL

## 2018-09-10 ENCOUNTER — Ambulatory Visit: Payer: Medicare Other | Admitting: *Deleted

## 2018-09-10 ENCOUNTER — Other Ambulatory Visit: Payer: Self-pay

## 2018-09-24 ENCOUNTER — Ambulatory Visit: Payer: Medicare Other

## 2018-09-27 ENCOUNTER — Other Ambulatory Visit: Payer: Self-pay | Admitting: Family

## 2018-09-27 DIAGNOSIS — E876 Hypokalemia: Secondary | ICD-10-CM

## 2018-09-27 MED ORDER — POTASSIUM CHLORIDE CRYS ER 20 MEQ PO TBCR: EXTENDED_RELEASE_TABLET | ORAL | 0 refills | Status: DC

## 2018-09-27 NOTE — Telephone Encounter (Signed)
Refill sent to [facility]. 

## 2018-10-07 ENCOUNTER — Other Ambulatory Visit: Payer: Self-pay | Admitting: Cardiovascular Disease

## 2018-10-07 DIAGNOSIS — I739 Peripheral vascular disease, unspecified: Secondary | ICD-10-CM

## 2018-10-08 ENCOUNTER — Ambulatory Visit (INDEPENDENT_AMBULATORY_CARE_PROVIDER_SITE_OTHER): Payer: Medicare Other | Admitting: *Deleted

## 2018-10-08 DIAGNOSIS — Z0000 Encounter for general adult medical examination without abnormal findings: Secondary | ICD-10-CM

## 2018-10-08 NOTE — Patient Instructions (Signed)
Preventive Care 67 Years and Older, Female Preventive care refers to lifestyle choices and visits with your health care provider that can promote health and wellness. This includes:  A yearly physical exam. This is also called an annual well check.  Regular dental and eye exams.  Immunizations.  Screening for certain conditions.  Healthy lifestyle choices, such as diet and exercise. What can I expect for my preventive care visit? Physical exam Your health care provider will check:  Height and weight. These may be used to calculate body mass index (BMI), which is a measurement that tells if you are at a healthy weight.  Heart rate and blood pressure.  Your skin for abnormal spots. Counseling Your health care provider may ask you questions about:  Alcohol, tobacco, and drug use.  Emotional well-being.  Home and relationship well-being.  Sexual activity.  Eating habits.  History of falls.  Memory and ability to understand (cognition).  Work and work Statistician.  Pregnancy and menstrual history. What immunizations do I need?  Influenza (flu) vaccine  This is recommended every year. Tetanus, diphtheria, and pertussis (Tdap) vaccine  You may need a Td booster every 10 years. Varicella (chickenpox) vaccine  You may need this vaccine if you have not already been vaccinated. Zoster (shingles) vaccine  You may need this after age 33. Pneumococcal conjugate (PCV13) vaccine  One dose is recommended after age 33. Pneumococcal polysaccharide (PPSV23) vaccine  One dose is recommended after age 72. Measles, mumps, and rubella (MMR) vaccine  You may need at least one dose of MMR if you were born in 1957 or later. You may also need a second dose. Meningococcal conjugate (MenACWY) vaccine  You may need this if you have certain conditions. Hepatitis A vaccine  You may need this if you have certain conditions or if you travel or work in places where you may be exposed  to hepatitis A. Hepatitis B vaccine  You may need this if you have certain conditions or if you travel or work in places where you may be exposed to hepatitis B. Haemophilus influenzae type b (Hib) vaccine  You may need this if you have certain conditions. You may receive vaccines as individual doses or as more than one vaccine together in one shot (combination vaccines). Talk with your health care provider about the risks and benefits of combination vaccines. What tests do I need? Blood tests  Lipid and cholesterol levels. These may be checked every 5 years, or more frequently depending on your overall health.  Hepatitis C test.  Hepatitis B test. Screening  Lung cancer screening. You may have this screening every year starting at age 39 if you have a 30-pack-year history of smoking and currently smoke or have quit within the past 15 years.  Colorectal cancer screening. All adults should have this screening starting at age 36 and continuing until age 15. Your health care provider may recommend screening at age 23 if you are at increased risk. You will have tests every 1-10 years, depending on your results and the type of screening test.  Diabetes screening. This is done by checking your blood sugar (glucose) after you have not eaten for a while (fasting). You may have this done every 1-3 years.  Mammogram. This may be done every 1-2 years. Talk with your health care provider about how often you should have regular mammograms.  BRCA-related cancer screening. This may be done if you have a family history of breast, ovarian, tubal, or peritoneal cancers.  Other tests  Sexually transmitted disease (STD) testing.  Bone density scan. This is done to screen for osteoporosis. You may have this done starting at age 76. Follow these instructions at home: Eating and drinking  Eat a diet that includes fresh fruits and vegetables, whole grains, lean protein, and low-fat dairy products. Limit  your intake of foods with high amounts of sugar, saturated fats, and salt.  Take vitamin and mineral supplements as recommended by your health care provider.  Do not drink alcohol if your health care provider tells you not to drink.  If you drink alcohol: ? Limit how much you have to 0-1 drink a day. ? Be aware of how much alcohol is in your drink. In the [place] one drink equals one 12 oz bottle of beer (355 mL), one 5 oz glass of wine (148 mL), or one 1 oz glass of hard liquor (44 mL). Lifestyle  Take daily care of your teeth and gums.  Stay active. Exercise for at least 30 minutes on 5 or more days each week.  Do not use any products that contain nicotine or tobacco, such as cigarettes, e-cigarettes, and chewing tobacco. If you need help quitting, ask your health care provider.  If you are sexually active, practice safe sex. Use a condom or other form of protection in order to prevent STIs (sexually transmitted infections).  Talk with your health care provider about taking a low-dose aspirin or statin. What's next?  Go to your health care provider once a year for a well check visit.  Ask your health care provider how often you should have your eyes and teeth checked.  Stay up to date on all vaccines. This information is not intended to replace advice given to you by your health care provider. Make sure you discuss any questions you have with your health care provider. Document Released: 03/02/2015 Document Revised: 01/28/2018 Document Reviewed: 01/28/2018 Elsevier Patient Education  2020 [employer].

## 2018-10-08 NOTE — Progress Notes (Signed)
MEDICARE ANNUAL WELLNESS VISIT  10/08/2018  Telephone Visit Disclaimer This Medicare AWV was conducted by telephone due to national recommendations for restrictions regarding the COVID-19 Pandemic (e.g. social distancing).  I verified, using two identifiers, that I am speaking with Leah Ferrell or their authorized healthcare agent. I discussed the limitations, risks, security, and privacy concerns of performing an evaluation and management service by telephone and the potential availability of an in-person appointment in the future. The patient expressed understanding and agreed to proceed.   Subjective:  Leah Ferrell is a 68 y.o. female patient of Hawks, Theador Hawthorne, FNP who had a Medicare Annual Wellness Visit today via telephone. Leah Ferrell is Retired and lives alone. she has 4 children. she reports that she is socially active and does interact with friends/family regularly. she is minimally physically active and enjoys taking the boat out on the lake and fishing.  Patient Care Team: Sharion Balloon, FNP as PCP - General (Nurse Practitioner) Arnoldo Lenis, MD as PCP - Cardiology (Cardiology) Wellington Hampshire, MD as Consulting Physician (Cardiology) Arnoldo Lenis, MD as Consulting Physician (Cardiology)  Advanced Directives 10/08/2018 12/01/2017 12/01/2017 03/19/2016 11/21/2015 10/24/2015 03/01/2015  Does Patient Have a Medical Advance Directive? No No No Yes No No No  Type of Advance Directive - - - Living will;Healthcare Power of Attorney - - -  Copy of [place] in Chart? - - - No - copy requested - - -  Would patient like information on creating a medical advance directive? No - Patient declined No - Patient declined - - Yes - Scientist, clinical (histocompatibility and immunogenetics) given Yes - Scientist, clinical (histocompatibility and immunogenetics) given No - patient declined information    Hospital Utilization Over the Past 12 Months: # of hospitalizations or ER visits: 1 # of surgeries: 0  Review of Systems     Patient reports that her overall health is better compared to last year.  Patient Reported Readings (BP, Pulse, CBG, Weight, etc) none  Review of Systems: History obtained from chart review  All other systems negative.  Pain Assessment Pain : No/denies pain     Current Medications & Allergies (verified) Allergies as of 10/08/2018      Reactions   Tape Rash      Medication List       Accurate as of October 08, 2018  2:08 PM. If you have any questions, ask your nurse or doctor.        STOP taking these medications   HumuLIN 70/30 KwikPen (70-30) 100 UNIT/ML PEN Generic drug: Insulin Isophane & Regular Human     TAKE these medications   albuterol 108 (90 Base) MCG/ACT inhaler Commonly known as: Proventil HFA INHALE ONE TO TWO PUFFS BY  MOUTH EVERY 6 HOURS AS  NEEDED FOR WHEEZING OR FOR  SHORTNESS OF BREATH What changed:   how much to take  how to take this  when to take this  reasons to take this  additional instructions   amLODipine 10 MG tablet Commonly known as: NORVASC TAKE 1 TABLET BY MOUTH  DAILY   aspirin EC 81 MG tablet Take 81 mg by mouth every morning.   Caltrate 600+D Plus Minerals 600-800 MG-UNIT Tabs Take 1 tablet by mouth 3 (three) times daily.   clopidogrel 75 MG tablet Commonly known as: PLAVIX TAKE 1 TABLET BY MOUTH  DAILY   fish oil-omega-3 fatty acids 1000 MG capsule Take 1 g by mouth every morning.   furosemide 40 MG  tablet Commonly known as: LASIX TAKE 1 TABLET BY MOUTH  DAILY MAY TAKE ADDITIONAL  TABLET AS NEEDED FOR  SWELLING   HumaLOG KwikPen 200 UNIT/ML Sopn Generic drug: Insulin Lispro INJECT SUBCUTANEOUSLY 50  UNITS 3 TIMES DAILY BEFORE  MEALS   hydroxypropyl methylcellulose / hypromellose 2.5 % ophthalmic solution Commonly known as: ISOPTO TEARS / GONIOVISC Place 1 drop into both eyes 3 (three) times daily as needed (for dry eyes).   lisinopril 20 MG tablet Commonly known as: ZESTRIL TAKE 2 TABLETS BY MOUTH  DAILY    metoprolol tartrate 100 MG tablet Commonly known as: LOPRESSOR Take 0.5 tablets (50 mg total) by mouth 2 (two) times daily.   montelukast 10 MG tablet Commonly known as: SINGULAIR TAKE 1 TABLET BY MOUTH  DAILY WITH BREAKFAST   nitroGLYCERIN 0.4 MG SL tablet Commonly known as: NITROSTAT Place 1 tablet (0.4 mg total) under the tongue every 5 (five) minutes x 3 doses as needed for chest pain (up to 3 doses).   potassium chloride SA 20 MEQ tablet Commonly known as: K-DUR Take 20 meq daily, take extra 10 meq if you take extra lasix   ReliOn Pen Needles 29G X 12MM Misc Generic drug: Insulin Pen Needle USE 1 NEEDLE TO INJECT INSULIN SUBCUTANEOUSLY 3 TIMES DAILY   BD ULTRA-FINE PEN NEEDLES 29G X 12.7MM Misc Generic drug: Insulin Pen Needle USE PENNEEDLES 3 TIMES  DAILY AS DIRECTED   rosuvastatin 40 MG tablet Commonly known as: CRESTOR TAKE 1 TABLET BY MOUTH  DAILY   vitamin C 1000 MG tablet Take 1,000 mg by mouth 2 (two) times daily.       History (reviewed): Past Medical History:  Diagnosis Date  . CAD (coronary artery disease)    a. 04/2012 NSTEMI: s/p DES to LAD.  Marland Kitchen Chronic diastolic CHF (congestive heart failure) ([place])   . Diabetes mellitus without complication ([place])   . Dysrhythmia   . Environmental and seasonal allergies   . Hyperlipidemia   . Hypertension   . Leukocytosis   . Morbid obesity ([place])   . NSTEMI (non-ST elevated myocardial infarction) ([place]) 04/27/2012  . PONV (postoperative nausea and vomiting)   . PVD (peripheral vascular disease) ([place])    a. 04/2012 ABI: R 0.49, L 0.39. Angiography 06/14: Significant ostial left common iliac artery stenosis extending into the distal aorta, severe left common femoral artery stenosis, bilateral SFA occlusion with heavy calcifications. Functionally, one-vessel runoff bilaterally below the knee  . Shortness of breath    Past Surgical History:  Procedure Laterality Date  . ABDOMINAL AORTAGRAM N/A 07/21/2012   Procedure:  ABDOMINAL Maxcine Ham;  Surgeon: Wellington Hampshire, MD;  Location: [facility] CATH LAB;  Service: Cardiovascular;  Laterality: N/A;  . CARDIAC CATHETERIZATION     stents X 2  . CORONARY ANGIOGRAM  04/27/2012   Procedure: CORONARY ANGIOGRAM;  Surgeon: Thayer Headings, MD;  Location: [facility] CATH LAB;  Service: Cardiovascular;;  . ENDARTERECTOMY FEMORAL Left 11/02/2013   Procedure: RESECTION LEFT COMMON FEMORAL ARTERY AND INSERTION OF INTERPOSITION 18mm HEMASHIELD GRAFT FROM LEFT EXTERNAL  ILIAC ARTERY TO LEFT PROFUNDA  FEMORIS ARTERY;  Surgeon: Mal Misty, MD;  Location: [facility];  Service: Vascular;  Laterality: Left;  . EYE SURGERY Right 2017  . FRACTURE SURGERY Right Jul 07, 2014   Right Foot-  [place]  at Home  by Heat duct  . ILIAC ARTERY STENT Left 08/31/2013  . LAD stent    . LEFT HEART CATHETERIZATION WITH CORONARY ANGIOGRAM N/A 04/23/2012  Procedure: LEFT HEART CATHETERIZATION WITH CORONARY ANGIOGRAM;  Surgeon: Peter M Martinique, MD;  Location: [facility] CATH LAB;  Service: Cardiovascular;  Laterality: N/A;  . LOWER EXTREMITY ANGIOGRAM Left 08/31/2013   Procedure: LOWER EXTREMITY ANGIOGRAM;  Surgeon: Wellington Hampshire, MD;  Location: [facility] CATH LAB;  Service: Cardiovascular;  Laterality: Left;  . PERCUTANEOUS STENT INTERVENTION Left 08/31/2013   Procedure: PERCUTANEOUS STENT INTERVENTION;  Surgeon: Wellington Hampshire, MD;  Location: [place] CATH LAB;  Service: Cardiovascular;  Laterality: Left;  Common Iliac artery  . PERIPHERAL VASCULAR CATHETERIZATION N/A 03/19/2016   Procedure: Abdominal Aortogram w/Lower Extremity;  Surgeon: Wellington Hampshire, MD;  Location: [facility] CV LAB;  Service: Cardiovascular;  Laterality: N/A;  . PERIPHERAL VASCULAR CATHETERIZATION  03/19/2016   Procedure: Peripheral Vascular Balloon Angioplasty-CFA Left;  Surgeon: Wellington Hampshire, MD;  Location: [place] CV LAB;  Service: Cardiovascular;;  . 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  Diabetes Mother   . Hyperlipidemia Mother   . Hypertension Mother   . Peripheral vascular disease Mother   . Diabetes Maternal Grandmother   . Heart attack Maternal Grandfather   . Heart disease Brother   . Other Sister        drowned  . Diabetes Brother   . Alcohol abuse Brother   . Other Sister        drowned  . Diabetes Daughter        borderline  . Hypertension Daughter   . Diabetes Son   . Hypertension Son   . Hyperlipidemia Son    Social History   Socioeconomic History  . Marital status: Widowed    Spouse name: Not on file  . Number of children: 4  . Years of education: completed school in [place]   . Highest education level: Not on file  Occupational History  . Occupation: retired  Scientific laboratory technician  . Financial resource strain: Not very hard  . Food insecurity    Worry: Never true    Inability: Never true  . Transportation needs    Medical: No    Non-medical: No  Tobacco Use  . Smoking status: Former Smoker    Packs/day: 1.00    Years: 36.00    Pack years: 36.00    Types: Cigarettes    Start date: 04/21/1964    Quit date: 02/17/2001    Years since quitting: 17.6  . Smokeless tobacco: Never Used  Substance and Sexual Activity  . Alcohol use: No    Alcohol/week: 0.0 standard drinks  . Drug use: No  . Sexual activity: Not Currently    Birth control/protection: Post-menopausal  Lifestyle  . Physical activity    Days per week: 0 days    Minutes per session: 0 min  . Stress: Not at all  Relationships  . Social connections    Talks on phone: More than three times a week    Gets together: More than three times a week    Attends religious service: Never    Active member of club or organization: No    Attends meetings of clubs or organizations: Never    Relationship status: Widowed  Other Topics Concern  . Not on file  Social History Narrative  . Not on file    Activities of Daily Living In your present state of health, do you have any difficulty  performing the following activities: 10/08/2018 12/02/2017  Hearing? N N  Vision? Y N  Comment has cataract on left eye and she is supposed to have surgery to remove -  Difficulty concentrating or making decisions? N N  Walking or climbing stairs? Y Y  Comment due to left leg pain/swelling -  Dressing or bathing? N N  Doing errands, shopping? N N  Preparing Food and eating ? N -  Using the Toilet? N -  In the past six months, have you accidently leaked urine? Y -  Comment "drips" if she doesn't make it to the bathroom in time -  Do you have problems with loss of bowel control? N -  Managing your Medications? N -  Managing your Finances? N -  Housekeeping or managing your Housekeeping? N -  Some recent data might be hidden    Patient Education/ Literacy How often do you need to have someone help you when you read instructions, pamphlets, or other written materials from your doctor or pharmacy?: 5 - Always(her daughter lives next to her so she reads everything for her) What is the last grade level you completed in school?: went to school in [place] and they only went for 5 years from age [age]  Exercise Current Exercise Habits: The patient does not participate in regular exercise at present, Exercise limited by: cardiac condition(s)(PVD)  Diet Patient reports consuming 3 meals a day and 2 snack(s) a day Patient reports that her primary diet is: Regular Patient reports that she does have regular access to food.   Depression Screen PHQ 2/9 Scores 10/08/2018 09/02/2018 04/27/2018 12/18/2017 09/11/2017 06/10/2017 03/02/2017  PHQ - 2 Score 0 0 0 0 0 0 0     Fall Risk Fall Risk  10/08/2018 09/02/2018 04/27/2018 12/18/2017 09/11/2017  Falls in the past year? 0 0 0 0 No  Number falls in past yr: - - - - -  Injury with Fall? - - - - -  Comment - - - - -     Objective:  Leah Ferrell seemed alert and oriented and she participated appropriately during our telephone visit.  Blood Pressure  Weight BMI  BP Readings from Last 3 Encounters:  09/02/18 (!) 151/64  06/29/18 (!) 139/44  04/27/18 (!) 142/56   Wt Readings from Last 3 Encounters:  09/02/18 211 lb (95.7 kg)  06/29/18 212 lb (96.2 kg)  04/27/18 215 lb 9.6 oz (97.8 kg)   BMI Readings from Last 1 Encounters:  09/02/18 37.98 kg/m    *Unable to obtain current vital signs, weight, and BMI due to telephone visit type  Hearing/Vision  . Leah Ferrell did not seem to have difficulty with hearing/understanding during the telephone conversation . Reports that she has not had a formal eye exam by an eye care professional within the past year . Reports that she has not had a formal hearing evaluation within the past year *Unable to fully assess hearing and vision during telephone visit type  Cognitive Function: 6CIT Screen 10/08/2018  What Year? 0 points  What month? 0 points  What time? 0 points  Count back from 20 0 points  Months in reverse 0 points  Repeat phrase 4 points  Total Score 4   (Normal:0-7, Significant for Dysfunction: >8)  Normal Cognitive Function Screening: Yes   Immunization & Health Maintenance Record Immunization History  Administered Date(s) Administered  . Hepatitis B, adult 04/11/2017, 05/18/2017  . Pneumococcal Conjugate-13 11/06/2016  . Pneumococcal Polysaccharide-23 10/17/2015  . Tdap 08/19/2011  . Zoster 09/26/2014    Health Maintenance  Topic Date Due  . OPHTHALMOLOGY EXAM  05/29/2018  . FOOT EXAM  09/12/2018  . HEMOGLOBIN A1C  03/05/2019  . MAMMOGRAM  05/14/2019  . DEXA SCAN  04/26/2020  . PNA vac Low Risk Adult (2 of 2 - PPSV23) 10/16/2020  . Fecal DNA (Cologuard)  02/08/2021  . TETANUS/TDAP  08/18/2021  . Hepatitis C Screening  Completed  . INFLUENZA VACCINE  Discontinued       Assessment  This is a routine wellness examination for Leah Ferrell.  Health Maintenance: Due or Overdue Health Maintenance Due  Topic Date Due  . OPHTHALMOLOGY EXAM  05/29/2018  . FOOT  EXAM  09/12/2018    Leah Ferrell does not need a referral for Community Assistance: Care Management:   no Social Work:    no Prescription Assistance:  no Nutrition/Diabetes Education:  no   Plan:  Personalized Goals Goals Addressed            This Visit's Progress   . DIET - INCREASE WATER INTAKE       Try to drink 6-8 glasses of water daily.      Personalized Health Maintenance & Screening Recommendations  Diabetic Eye Exam  Lung Cancer Screening Recommended: no (Low Dose CT Chest recommended if Age 62-80 years, 30 pack-year currently smoking OR have quit w/in past 15 years) Hepatitis C Screening recommended: no HIV Screening recommended: no  Advanced Directives: Written information was not prepared per patient's request.  Referrals & Orders No orders of the defined types were placed in this encounter.   Follow-up Plan . Follow-up with Sharion Balloon, FNP as planned . Schedule your Diabetic Eye Exam as discussed   I have personally reviewed and noted the following in the patient's chart:   . Medical and social history . Use of alcohol, tobacco or illicit drugs  . Current medications and supplements . Functional ability and status . Nutritional status . Physical activity . Advanced directives . List of other physicians . Hospitalizations, surgeries, and ER visits in previous 12 months . Vitals . Screenings to include cognitive, depression, and falls . Referrals and appointments  In addition, I have reviewed and discussed with Leah Ferrell certain preventive protocols, quality metrics, and best practice recommendations. A written personalized care plan for preventive services as well as general preventive health recommendations is available and can be mailed to the patient at her request.      Marylin Crosby, LPN  [id number]

## 2018-10-22 ENCOUNTER — Ambulatory Visit: Payer: Medicare Other | Admitting: *Deleted

## 2018-10-27 ENCOUNTER — Other Ambulatory Visit: Payer: Self-pay | Admitting: Family

## 2018-10-27 DIAGNOSIS — I2510 Atherosclerotic heart disease of native coronary artery without angina pectoris: Secondary | ICD-10-CM

## 2018-10-27 DIAGNOSIS — E1151 Type 2 diabetes mellitus with diabetic peripheral angiopathy without gangrene: Secondary | ICD-10-CM

## 2018-10-27 DIAGNOSIS — I739 Peripheral vascular disease, unspecified: Secondary | ICD-10-CM

## 2018-10-27 DIAGNOSIS — I5031 Acute diastolic (congestive) heart failure: Secondary | ICD-10-CM

## 2018-11-09 ENCOUNTER — Ambulatory Visit: Payer: Medicare Other | Admitting: Cardiovascular Disease

## 2018-11-10 ENCOUNTER — Telehealth: Payer: Self-pay | Admitting: Cardiovascular Disease

## 2018-11-10 NOTE — Telephone Encounter (Signed)

## 2018-11-11 ENCOUNTER — Other Ambulatory Visit: Payer: Self-pay | Admitting: Cardiovascular Disease

## 2018-11-11 ENCOUNTER — Ambulatory Visit (INDEPENDENT_AMBULATORY_CARE_PROVIDER_SITE_OTHER): Payer: Medicare Other

## 2018-11-11 ENCOUNTER — Other Ambulatory Visit: Payer: Self-pay

## 2018-11-11 DIAGNOSIS — I739 Peripheral vascular disease, unspecified: Secondary | ICD-10-CM

## 2018-11-15 ENCOUNTER — Other Ambulatory Visit: Payer: Self-pay | Admitting: Family

## 2018-11-15 DIAGNOSIS — E1159 Type 2 diabetes mellitus with other circulatory complications: Secondary | ICD-10-CM

## 2018-11-15 DIAGNOSIS — I152 Hypertension secondary to endocrine disorders: Secondary | ICD-10-CM

## 2018-11-23 ENCOUNTER — Ambulatory Visit: Payer: Medicare Other | Admitting: Cardiovascular Disease

## 2018-12-03 ENCOUNTER — Other Ambulatory Visit: Payer: Self-pay

## 2018-12-07 ENCOUNTER — Encounter: Payer: Self-pay | Admitting: Family

## 2018-12-07 ENCOUNTER — Ambulatory Visit (INDEPENDENT_AMBULATORY_CARE_PROVIDER_SITE_OTHER): Payer: Medicare Other | Admitting: Family

## 2018-12-07 ENCOUNTER — Other Ambulatory Visit: Payer: Self-pay

## 2018-12-07 VITALS — BP 149/56 | HR 54 | Temp 97.1°F | Ht 62.5 in | Wt 208.6 lb

## 2018-12-07 DIAGNOSIS — I2510 Atherosclerotic heart disease of native coronary artery without angina pectoris: Secondary | ICD-10-CM

## 2018-12-07 DIAGNOSIS — I739 Peripheral vascular disease, unspecified: Secondary | ICD-10-CM | POA: Diagnosis not present

## 2018-12-07 DIAGNOSIS — E1159 Type 2 diabetes mellitus with other circulatory complications: Secondary | ICD-10-CM

## 2018-12-07 DIAGNOSIS — E1151 Type 2 diabetes mellitus with diabetic peripheral angiopathy without gangrene: Secondary | ICD-10-CM | POA: Diagnosis not present

## 2018-12-07 DIAGNOSIS — E785 Hyperlipidemia, unspecified: Secondary | ICD-10-CM

## 2018-12-07 DIAGNOSIS — I10 Essential (primary) hypertension: Secondary | ICD-10-CM | POA: Diagnosis not present

## 2018-12-07 DIAGNOSIS — E139 Other specified diabetes mellitus without complications: Secondary | ICD-10-CM | POA: Diagnosis not present

## 2018-12-07 DIAGNOSIS — E1169 Type 2 diabetes mellitus with other specified complication: Secondary | ICD-10-CM

## 2018-12-07 DIAGNOSIS — E118 Type 2 diabetes mellitus with unspecified complications: Secondary | ICD-10-CM | POA: Diagnosis not present

## 2018-12-07 DIAGNOSIS — I5032 Chronic diastolic (congestive) heart failure: Secondary | ICD-10-CM | POA: Diagnosis not present

## 2018-12-07 DIAGNOSIS — I252 Old myocardial infarction: Secondary | ICD-10-CM | POA: Diagnosis not present

## 2018-12-07 DIAGNOSIS — I152 Hypertension secondary to endocrine disorders: Secondary | ICD-10-CM

## 2018-12-07 LAB — BAYER DCA HB A1C WAIVED: HB A1C (BAYER DCA - WAIVED): 7.7 % — ABNORMAL HIGH (ref <7.0)

## 2018-12-07 MED ORDER — MUPIROCIN 2 % EX OINT: 1.0000 "application " | TOPICAL_OINTMENT | Freq: Two times a day (BID) | CUTANEOUS | 0 refills | Status: DC

## 2018-12-07 NOTE — Patient Instructions (Signed)
Diabetes Mellitus and Foot Care Foot care is an important part of your health, especially when you have diabetes. Diabetes may cause you to have problems because of poor blood flow (circulation) to your feet and legs, which can cause your skin to:  Become thinner and drier.  Break more easily.  Heal more slowly.  Peel and crack. You may also have nerve damage (neuropathy) in your legs and feet, causing decreased feeling in them. This means that you may not notice minor injuries to your feet that could lead to more serious problems. Noticing and addressing any potential problems early is the best way to prevent future foot problems. How to care for your feet Foot hygiene  Wash your feet daily with warm water and mild soap. Do not use hot water. Then, pat your feet and the areas between your toes until they are completely dry. Do not soak your feet as this can dry your skin.  Trim your toenails straight across. Do not dig under them or around the cuticle. File the edges of your nails with an emery board or nail file.  Apply a moisturizing lotion or petroleum jelly to the skin on your feet and to dry, brittle toenails. Use lotion that does not contain alcohol and is unscented. Do not apply lotion between your toes. Shoes and socks  Wear clean socks or stockings every day. Make sure they are not too tight. Do not wear knee-high stockings since they may decrease blood flow to your legs.  Wear shoes that fit properly and have enough cushioning. Always look in your shoes before you put them on to be sure there are no objects inside.  To break in new shoes, wear them for just a few hours a day. This prevents injuries on your feet. Wounds, scrapes, corns, and calluses  Check your feet daily for blisters, cuts, bruises, sores, and redness. If you cannot see the bottom of your feet, use a mirror or ask someone for help.  Do not cut corns or calluses or try to remove them with medicine.  If you  find a minor scrape, cut, or break in the skin on your feet, keep it and the skin around it clean and dry. You may clean these areas with mild soap and water. Do not clean the area with peroxide, alcohol, or iodine.  If you have a wound, scrape, corn, or callus on your foot, look at it several times a day to make sure it is healing and not infected. Check for: ? Redness, swelling, or pain. ? Fluid or blood. ? Warmth. ? Pus or a bad smell. General instructions  Do not cross your legs. This may decrease blood flow to your feet.  Do not use heating pads or hot water bottles on your feet. They may burn your skin. If you have lost feeling in your feet or legs, you may not know this is happening until it is too late.  Protect your feet from hot and cold by wearing shoes, such as at the beach or on hot pavement.  Schedule a complete foot exam at least once a year (annually) or more often if you have foot problems. If you have foot problems, report any cuts, sores, or bruises to your health care provider immediately. Contact a health care provider if:  You have a medical condition that increases your risk of infection and you have any cuts, sores, or bruises on your feet.  You have an injury that is not   healing.  You have redness on your legs or feet.  You feel burning or tingling in your legs or feet.  You have pain or cramps in your legs and feet.  Your legs or feet are numb.  Your feet always feel cold.  You have pain around a toenail. Get help right away if:  You have a wound, scrape, corn, or callus on your foot and: ? You have pain, swelling, or redness that gets worse. ? You have fluid or blood coming from the wound, scrape, corn, or callus. ? Your wound, scrape, corn, or callus feels warm to the touch. ? You have pus or a bad smell coming from the wound, scrape, corn, or callus. ? You have a fever. ? You have a red line going up your leg. Summary  Check your feet every day  for cuts, sores, red spots, swelling, and blisters.  Moisturize feet and legs daily.  Wear shoes that fit properly and have enough cushioning.  If you have foot problems, report any cuts, sores, or bruises to your health care provider immediately.  Schedule a complete foot exam at least once a year (annually) or more often if you have foot problems. This information is not intended to replace advice given to you by your health care provider. Make sure you discuss any questions you have with your health care provider. Document Released: 02/01/2000 Document Revised: 03/18/2017 Document Reviewed: 03/07/2016 Elsevier Patient Education  2020 [employer]  

## 2018-12-07 NOTE — Progress Notes (Signed)
Subjective:    Patient ID: Leah Ferrell, female    DOB: 08-Aug-1950, 68 y.o.   MRN: [id number]  Chief Complaint  Patient presents with  . Medical Management of Chronic Issues   Pt presents to the office today for chronic follow up. Pt has CHF, CAD and hx MI and is followed by Cardiologists every 6 months. PT is followed by Vascular for PVD and PAD. PT states these are stable. Diabetes She presents for her follow-up diabetic visit. She has type 2 diabetes mellitus. Her disease course has been stable. There are no hypoglycemic associated symptoms. Pertinent negatives for hypoglycemia include no headaches. Associated symptoms include blurred vision, fatigue and visual change. Pertinent negatives for diabetes include no foot paresthesias. Symptoms are stable. Diabetic complications include a CVA, heart disease, peripheral neuropathy and PVD. Risk factors for coronary artery disease include diabetes mellitus, dyslipidemia, family history, hypertension, sedentary lifestyle and post-menopausal. She is following a generally healthy diet. Her overall blood glucose range is 180-200 mg/dl. Eye exam is current.  Hypertension This is a chronic problem. The current episode started more than 1 year ago. The problem has been waxing and waning since onset. The problem is controlled. Associated symptoms include blurred vision. Pertinent negatives include no headaches, malaise/fatigue or shortness of breath. Risk factors for coronary artery disease include diabetes mellitus, dyslipidemia, obesity and sedentary lifestyle. The current treatment provides moderate improvement. Hypertensive end-organ damage includes CAD/MI, CVA, heart failure and PVD.  Hyperlipidemia This is a chronic problem. The current episode started more than 1 year ago. The problem is controlled. Recent lipid tests were reviewed and are normal. Exacerbating diseases include obesity. Pertinent negatives include no shortness of breath. The current  treatment provides moderate improvement of lipids. Risk factors for coronary artery disease include dyslipidemia, diabetes mellitus, hypertension, post-menopausal and a sedentary lifestyle.  Congestive Heart Failure Presents for follow-up visit. Associated symptoms include fatigue and nocturia. Pertinent negatives include no chest pressure, edema or shortness of breath. The symptoms have been stable.      Review of Systems  Constitutional: Positive for fatigue. Negative for malaise/fatigue.  Eyes: Positive for blurred vision.  Respiratory: Negative for shortness of breath.   Genitourinary: Positive for nocturia.  Neurological: Negative for headaches.  All other systems reviewed and are negative.      Objective:   Physical Exam Vitals signs reviewed.  Constitutional:      General: She is not in acute distress.    Appearance: She is well-developed. She is obese.  HENT:     Head: Normocephalic and atraumatic.     Right Ear: Tympanic membrane normal.     Left Ear: Tympanic membrane normal.  Eyes:     Pupils: Pupils are equal, round, and reactive to light.  Neck:     Musculoskeletal: Normal range of motion and neck supple.     Thyroid: No thyromegaly.  Cardiovascular:     Rate and Rhythm: Normal rate and regular rhythm.     Heart sounds: Normal heart sounds. No murmur.  Pulmonary:     Effort: Pulmonary effort is normal. No respiratory distress.     Breath sounds: Normal breath sounds. No wheezing.  Abdominal:     General: Bowel sounds are normal. There is no distension.     Palpations: Abdomen is soft.     Tenderness: There is no abdominal tenderness.  Musculoskeletal: Normal range of motion.        General: No tenderness.     Right lower leg:  Edema (trace) present.     Left lower leg: Edema (trace) present.       Feet:  Feet:     Comments: Erythemas abrasion on right medial foot Skin:    General: Skin is warm and dry.  Neurological:     Mental Status: She is alert and  oriented to person, place, and time.     Cranial Nerves: No cranial nerve deficit.     Deep Tendon Reflexes: Reflexes are normal and symmetric.  Psychiatric:        Behavior: Behavior normal.        Thought Content: Thought content normal.        Judgment: Judgment normal.    See Diabetic foot note    BP (!) 149/56   Pulse (!) 54   Temp (!) 97.1 F (36.2 C) (Temporal)   Ht 5' 2.5" (1.588 m)   Wt 208 lb 9.6 oz (94.6 kg)   SpO2 97%   BMI 37.55 kg/m   Assessment & Plan:  Leah Ferrell comes in today with chief complaint of Medical Management of Chronic Issues   Diagnosis and orders addressed:  1. PVD (peripheral vascular disease) (HCC) - CBC with Differential/Platelet - CMP14+EGFR - mupirocin ointment (BACTROBAN) 2 %; Apply 1 application topically 2 (two) times daily.  Dispense: 22 g; Refill: 0  2. PAD (peripheral artery disease) (HCC) - CBC with Differential/Platelet - CMP14+EGFR  3. Hypertension associated with diabetes ([place]) - CBC with Differential/Platelet - CMP14+EGFR  4. Diabetes mellitus with peripheral vascular disease ([place]) - CBC with Differential/Platelet - CMP14+EGFR - Bayer DCA Hb A1c Waived - TSH  5. Chronic diastolic heart failure (HCC) - CBC with Differential/Platelet - CMP14+EGFR  6. Coronary artery disease involving native coronary artery of native heart without angina pectoris - CBC with Differential/Platelet - CMP14+EGFR  7. 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 Morbid obesity ([place]) - CBC with Differential/Platelet - CMP14+EGFR  10. Hx of non-ST elevation myocardial infarction (NSTEMI) - CBC with Differential/Platelet - CMP14+EGFR  11. Diabetic foot ([place]) Keep clean and dry Call if area worsens or does not improve  - mupirocin  ointment (BACTROBAN) 2 %; Apply 1 application topically 2 (two) times daily.  Dispense: 22 g; Refill: 0   Labs pending Health Maintenance reviewed Diet and exercise encouraged  Follow up plan: 3 months    Evelina Dun, FNP

## 2018-12-08 ENCOUNTER — Other Ambulatory Visit: Payer: Self-pay | Admitting: Family

## 2018-12-08 LAB — CMP14+EGFR
ALT: 21 IU/L (ref 0–32)
AST: 21 IU/L (ref 0–40)
Albumin/Globulin Ratio: 1.2 (ref 1.2–2.2)
Albumin: 4.2 g/dL (ref 3.8–4.8)
Alkaline Phosphatase: 57 IU/L (ref 39–117)
BUN/Creatinine Ratio: 8 — ABNORMAL LOW (ref 12–28)
BUN: 6 mg/dL — ABNORMAL LOW (ref 8–27)
Bilirubin Total: 0.3 mg/dL (ref 0.0–1.2)
CO2: 30 mmol/L — ABNORMAL HIGH (ref 20–29)
Calcium: 9.8 mg/dL (ref 8.7–10.3)
Chloride: 95 mmol/L — ABNORMAL LOW (ref 96–106)
Creatinine, Ser: 0.73 mg/dL (ref 0.57–1.00)
GFR calc Af Amer: 99 mL/min/{1.73_m2} (ref >59)
GFR calc non Af Amer: 85 mL/min/{1.73_m2} (ref >59)
Globulin, Total: 3.4 g/dL (ref 1.5–4.5)
Glucose: 159 mg/dL — ABNORMAL HIGH (ref 65–99)
Potassium: 4.3 mmol/L (ref 3.5–5.2)
Sodium: 141 mmol/L (ref 134–144)
Total Protein: 7.6 g/dL (ref 6.0–8.5)

## 2018-12-08 LAB — CBC WITH DIFFERENTIAL/PLATELET
Basophils Absolute: 0 10*3/uL (ref 0.0–0.2)
Basos: 0 %
EOS (ABSOLUTE): 0.2 10*3/uL (ref 0.0–0.4)
Eos: 2 %
Hematocrit: 45.6 % (ref 34.0–46.6)
Hemoglobin: 15 g/dL (ref 11.1–15.9)
Immature Grans (Abs): 0 10*3/uL (ref 0.0–0.1)
Immature Granulocytes: 0 %
Lymphocytes Absolute: 3 10*3/uL (ref 0.7–3.1)
Lymphs: 28 %
MCH: 30.3 pg (ref 26.6–33.0)
MCHC: 32.9 g/dL (ref 31.5–35.7)
MCV: 92 fL (ref 79–97)
Monocytes Absolute: 0.6 10*3/uL (ref 0.1–0.9)
Monocytes: 6 %
Neutrophils Absolute: 6.6 10*3/uL (ref 1.4–7.0)
Neutrophils: 64 %
Platelets: 297 10*3/uL (ref 150–450)
RBC: 4.95 x10E6/uL (ref 3.77–5.28)
RDW: 14.3 % (ref 11.7–15.4)
WBC: 10.4 10*3/uL (ref 3.4–10.8)

## 2018-12-08 LAB — TSH: TSH: 1.47 u[IU]/mL (ref 0.450–4.500)

## 2018-12-09 DIAGNOSIS — H25812 Combined forms of age-related cataract, left eye: Secondary | ICD-10-CM | POA: Diagnosis not present

## 2018-12-09 DIAGNOSIS — H26491 Other secondary cataract, right eye: Secondary | ICD-10-CM | POA: Diagnosis not present

## 2018-12-09 DIAGNOSIS — E119 Type 2 diabetes mellitus without complications: Secondary | ICD-10-CM | POA: Diagnosis not present

## 2018-12-09 DIAGNOSIS — Z961 Presence of intraocular lens: Secondary | ICD-10-CM | POA: Diagnosis not present

## 2018-12-09 LAB — HM DIABETES EYE EXAM

## 2018-12-10 ENCOUNTER — Telehealth: Payer: Self-pay | Admitting: Family

## 2018-12-10 NOTE — Telephone Encounter (Signed)
Labs mailed to patient.

## 2018-12-12 ENCOUNTER — Emergency Department (HOSPITAL_COMMUNITY): Payer: Medicare Other

## 2018-12-12 ENCOUNTER — Observation Stay (HOSPITAL_COMMUNITY)
Admission: EM | Admit: 2018-12-12 | Discharge: 2018-12-13 | Disposition: A | Discharge status: Home / Self Care | Payer: Medicare Other | Attending: Family Medicine | Admitting: Family Medicine

## 2018-12-12 ENCOUNTER — Encounter (HOSPITAL_COMMUNITY): Payer: Self-pay | Admitting: Emergency Medicine

## 2018-12-12 ENCOUNTER — Other Ambulatory Visit: Payer: Self-pay

## 2018-12-12 DIAGNOSIS — Z79899 Other long term (current) drug therapy: Secondary | ICD-10-CM | POA: Diagnosis not present

## 2018-12-12 DIAGNOSIS — Z20828 Contact with and (suspected) exposure to other viral communicable diseases: Secondary | ICD-10-CM | POA: Diagnosis not present

## 2018-12-12 DIAGNOSIS — I10 Essential (primary) hypertension: Secondary | ICD-10-CM | POA: Diagnosis present

## 2018-12-12 DIAGNOSIS — E1151 Type 2 diabetes mellitus with diabetic peripheral angiopathy without gangrene: Secondary | ICD-10-CM

## 2018-12-12 DIAGNOSIS — R519 Headache, unspecified: Secondary | ICD-10-CM | POA: Diagnosis present

## 2018-12-12 DIAGNOSIS — I5032 Chronic diastolic (congestive) heart failure: Secondary | ICD-10-CM | POA: Diagnosis not present

## 2018-12-12 DIAGNOSIS — E1169 Type 2 diabetes mellitus with other specified complication: Secondary | ICD-10-CM | POA: Diagnosis not present

## 2018-12-12 DIAGNOSIS — Z7902 Long term (current) use of antithrombotics/antiplatelets: Secondary | ICD-10-CM | POA: Insufficient documentation

## 2018-12-12 DIAGNOSIS — R0689 Other abnormalities of breathing: Secondary | ICD-10-CM | POA: Diagnosis not present

## 2018-12-12 DIAGNOSIS — I110 Hypertensive heart disease with heart failure: Secondary | ICD-10-CM | POA: Diagnosis not present

## 2018-12-12 DIAGNOSIS — E1159 Type 2 diabetes mellitus with other circulatory complications: Secondary | ICD-10-CM | POA: Diagnosis present

## 2018-12-12 DIAGNOSIS — R42 Dizziness and giddiness: Secondary | ICD-10-CM | POA: Insufficient documentation

## 2018-12-12 DIAGNOSIS — E782 Mixed hyperlipidemia: Secondary | ICD-10-CM | POA: Diagnosis present

## 2018-12-12 DIAGNOSIS — I739 Peripheral vascular disease, unspecified: Secondary | ICD-10-CM

## 2018-12-12 DIAGNOSIS — I152 Hypertension secondary to endocrine disorders: Secondary | ICD-10-CM

## 2018-12-12 DIAGNOSIS — I5031 Acute diastolic (congestive) heart failure: Secondary | ICD-10-CM

## 2018-12-12 DIAGNOSIS — I160 Hypertensive urgency: Secondary | ICD-10-CM | POA: Diagnosis not present

## 2018-12-12 DIAGNOSIS — Z7982 Long term (current) use of aspirin: Secondary | ICD-10-CM | POA: Diagnosis not present

## 2018-12-12 DIAGNOSIS — Z794 Long term (current) use of insulin: Secondary | ICD-10-CM | POA: Insufficient documentation

## 2018-12-12 DIAGNOSIS — I2510 Atherosclerotic heart disease of native coronary artery without angina pectoris: Secondary | ICD-10-CM | POA: Diagnosis present

## 2018-12-12 DIAGNOSIS — E785 Hyperlipidemia, unspecified: Secondary | ICD-10-CM | POA: Diagnosis not present

## 2018-12-12 DIAGNOSIS — R5381 Other malaise: Secondary | ICD-10-CM | POA: Diagnosis not present

## 2018-12-12 LAB — CBC WITH DIFFERENTIAL/PLATELET
Abs Immature Granulocytes: 0.07 10*3/uL (ref 0.00–0.07)
Basophils Absolute: 0.1 10*3/uL (ref 0.0–0.1)
Basophils Relative: 0 %
Eosinophils Absolute: 0.2 10*3/uL (ref 0.0–0.5)
Eosinophils Relative: 2 %
HCT: 45.9 % (ref 36.0–46.0)
Hemoglobin: 14.4 g/dL (ref 12.0–15.0)
Immature Granulocytes: 1 %
Lymphocytes Relative: 23 %
Lymphs Abs: 2.8 10*3/uL (ref 0.7–4.0)
MCH: 29.9 pg (ref 26.0–34.0)
MCHC: 31.4 g/dL (ref 30.0–36.0)
MCV: 95.2 fL (ref 80.0–100.0)
Monocytes Absolute: 1 10*3/uL (ref 0.1–1.0)
Monocytes Relative: 8 %
Neutro Abs: 8.2 10*3/uL — ABNORMAL HIGH (ref 1.7–7.7)
Neutrophils Relative %: 66 %
Platelets: 277 10*3/uL (ref 150–400)
RBC: 4.82 MIL/uL (ref 3.87–5.11)
RDW: 14.7 % (ref 11.5–15.5)
WBC: 12.4 10*3/uL — ABNORMAL HIGH (ref 4.0–10.5)
nRBC: 0 % (ref 0.0–0.2)

## 2018-12-12 LAB — BASIC METABOLIC PANEL
Anion gap: 9 (ref 5–15)
BUN: 14 mg/dL (ref 8–23)
CO2: 30 mmol/L (ref 22–32)
Calcium: 9.5 mg/dL (ref 8.9–10.3)
Chloride: 98 mmol/L (ref 98–111)
Creatinine, Ser: 0.69 mg/dL (ref 0.44–1.00)
GFR calc Af Amer: >60 mL/min (ref >60)
GFR calc non Af Amer: >60 mL/min (ref >60)
Glucose, Bld: 143 mg/dL — ABNORMAL HIGH (ref 70–99)
Potassium: 4 mmol/L (ref 3.5–5.1)
Sodium: 137 mmol/L (ref 135–145)

## 2018-12-12 LAB — TROPONIN I (HIGH SENSITIVITY): Troponin I (High Sensitivity): 42 ng/L — ABNORMAL HIGH (ref <18)

## 2018-12-12 MED ORDER — HYDRALAZINE HCL 20 MG/ML IJ SOLN
10.0000 mg | Freq: Once | INTRAMUSCULAR | Status: AC
  Administered 2018-12-12: 10 mg via INTRAVENOUS
  Filled 2018-12-12: qty 1

## 2018-12-12 NOTE — ED Triage Notes (Signed)
Pt has history of high blood pressure. Takes blood pressure at home, home medications have not brought down blood pressure. Pt has history of two heart attaches in the past. EMS blood pressure 170/90 which is less than pt reports at home. Lisinopril, Hydralazine, and Amlodipine.

## 2018-12-13 ENCOUNTER — Observation Stay (HOSPITAL_BASED_OUTPATIENT_CLINIC_OR_DEPARTMENT_OTHER): Payer: Medicare Other

## 2018-12-13 ENCOUNTER — Other Ambulatory Visit: Payer: Self-pay

## 2018-12-13 DIAGNOSIS — E1151 Type 2 diabetes mellitus with diabetic peripheral angiopathy without gangrene: Secondary | ICD-10-CM

## 2018-12-13 DIAGNOSIS — I2510 Atherosclerotic heart disease of native coronary artery without angina pectoris: Secondary | ICD-10-CM | POA: Diagnosis not present

## 2018-12-13 DIAGNOSIS — I340 Nonrheumatic mitral (valve) insufficiency: Secondary | ICD-10-CM | POA: Diagnosis not present

## 2018-12-13 DIAGNOSIS — I160 Hypertensive urgency: Secondary | ICD-10-CM | POA: Diagnosis not present

## 2018-12-13 DIAGNOSIS — R519 Headache, unspecified: Secondary | ICD-10-CM | POA: Diagnosis present

## 2018-12-13 LAB — COMPREHENSIVE METABOLIC PANEL
ALT: 19 U/L (ref 0–44)
AST: 19 U/L (ref 15–41)
Albumin: 3.5 g/dL (ref 3.5–5.0)
Alkaline Phosphatase: 41 U/L (ref 38–126)
Anion gap: 12 (ref 5–15)
BUN: 16 mg/dL (ref 8–23)
CO2: 29 mmol/L (ref 22–32)
Calcium: 9.4 mg/dL (ref 8.9–10.3)
Chloride: 97 mmol/L — ABNORMAL LOW (ref 98–111)
Creatinine, Ser: 0.8 mg/dL (ref 0.44–1.00)
GFR calc Af Amer: >60 mL/min (ref >60)
GFR calc non Af Amer: >60 mL/min (ref >60)
Glucose, Bld: 259 mg/dL — ABNORMAL HIGH (ref 70–99)
Potassium: 3.8 mmol/L (ref 3.5–5.1)
Sodium: 138 mmol/L (ref 135–145)
Total Bilirubin: 0.4 mg/dL (ref 0.3–1.2)
Total Protein: 7 g/dL (ref 6.5–8.1)

## 2018-12-13 LAB — HIV ANTIBODY (ROUTINE TESTING W REFLEX): HIV Screen 4th Generation wRfx: NONREACTIVE

## 2018-12-13 LAB — CBC
HCT: 42.5 % (ref 36.0–46.0)
Hemoglobin: 13.6 g/dL (ref 12.0–15.0)
MCH: 30.2 pg (ref 26.0–34.0)
MCHC: 32 g/dL (ref 30.0–36.0)
MCV: 94.4 fL (ref 80.0–100.0)
Platelets: 250 10*3/uL (ref 150–400)
RBC: 4.5 MIL/uL (ref 3.87–5.11)
RDW: 14.6 % (ref 11.5–15.5)
WBC: 12.7 10*3/uL — ABNORMAL HIGH (ref 4.0–10.5)
nRBC: 0 % (ref 0.0–0.2)

## 2018-12-13 LAB — ECHOCARDIOGRAM COMPLETE
Height: 62 in
Weight: 3356.8 oz

## 2018-12-13 LAB — TROPONIN I (HIGH SENSITIVITY)
Troponin I (High Sensitivity): 34 ng/L — ABNORMAL HIGH (ref <18)
Troponin I (High Sensitivity): 60 ng/L — ABNORMAL HIGH (ref <18)

## 2018-12-13 LAB — GLUCOSE, CAPILLARY
Glucose-Capillary: 234 mg/dL — ABNORMAL HIGH (ref 70–99)
Glucose-Capillary: 253 mg/dL — ABNORMAL HIGH (ref 70–99)
Glucose-Capillary: 323 mg/dL — ABNORMAL HIGH (ref 70–99)

## 2018-12-13 LAB — CBG MONITORING, ED: Glucose-Capillary: 189 mg/dL — ABNORMAL HIGH (ref 70–99)

## 2018-12-13 LAB — SARS CORONAVIRUS 2 (TAT 6-24 HRS): SARS Coronavirus 2: NEGATIVE

## 2018-12-13 MED ORDER — ACETAMINOPHEN 325 MG PO TABS: 650.0000 mg | ORAL_TABLET | Freq: Four times a day (QID) | ORAL | 0 refills | Status: DC | PRN

## 2018-12-13 MED ORDER — LISINOPRIL 10 MG PO TABS
40.0000 mg | ORAL_TABLET | Freq: Every day | ORAL | Status: DC
  Administered 2018-12-13: 40 mg via ORAL
  Filled 2018-12-13: qty 4

## 2018-12-13 MED ORDER — ROSUVASTATIN CALCIUM 40 MG PO TABS: 40.0000 mg | ORAL_TABLET | Freq: Every day | ORAL | 1 refills | Status: DC

## 2018-12-13 MED ORDER — ENOXAPARIN SODIUM 60 MG/0.6ML ~~LOC~~ SOLN
0.5000 mg/kg | SUBCUTANEOUS | Status: DC
  Administered 2018-12-13: 45 mg via SUBCUTANEOUS
  Filled 2018-12-13: qty 0.6

## 2018-12-13 MED ORDER — CLOPIDOGREL BISULFATE 75 MG PO TABS
75.0000 mg | ORAL_TABLET | Freq: Every day | ORAL | Status: DC
  Administered 2018-12-13: 75 mg via ORAL
  Filled 2018-12-13: qty 1

## 2018-12-13 MED ORDER — ACETAMINOPHEN 325 MG PO TABS: 650.0000 mg | ORAL_TABLET | Freq: Four times a day (QID) | ORAL | Status: DC | PRN

## 2018-12-13 MED ORDER — CLOPIDOGREL BISULFATE 75 MG PO TABS: 75.0000 mg | ORAL_TABLET | Freq: Every day | ORAL | 3 refills | Status: DC

## 2018-12-13 MED ORDER — HYDRALAZINE HCL 25 MG PO TABS: 50.0000 mg | ORAL_TABLET | Freq: Three times a day (TID) | ORAL | Status: DC

## 2018-12-13 MED ORDER — ROSUVASTATIN CALCIUM 20 MG PO TABS
40.0000 mg | ORAL_TABLET | Freq: Every day | ORAL | Status: DC
  Administered 2018-12-13: 40 mg via ORAL
  Filled 2018-12-13: qty 2

## 2018-12-13 MED ORDER — ALBUTEROL SULFATE (2.5 MG/3ML) 0.083% IN NEBU: 3.0000 mL | INHALATION_SOLUTION | Freq: Four times a day (QID) | RESPIRATORY_TRACT | Status: DC | PRN

## 2018-12-13 MED ORDER — ASPIRIN EC 81 MG PO TBEC
81.0000 mg | DELAYED_RELEASE_TABLET | Freq: Every morning | ORAL | Status: DC
  Administered 2018-12-13: 81 mg via ORAL
  Filled 2018-12-13: qty 1

## 2018-12-13 MED ORDER — POLYVINYL ALCOHOL 1.4 % OP SOLN: 1.0000 [drp] | OPHTHALMIC | Status: DC | PRN

## 2018-12-13 MED ORDER — LISINOPRIL 20 MG PO TABS: 40.0000 mg | ORAL_TABLET | Freq: Every day | ORAL | 1 refills | Status: DC

## 2018-12-13 MED ORDER — NICARDIPINE HCL IN NACL 20-0.86 MG/200ML-% IV SOLN
3.0000 mg/h | INTRAVENOUS | Status: DC
  Administered 2018-12-13: 5 mg/h via INTRAVENOUS
  Filled 2018-12-13: qty 200

## 2018-12-13 MED ORDER — SODIUM CHLORIDE 0.9% FLUSH: 3.0000 mL | INTRAVENOUS | Status: DC | PRN

## 2018-12-13 MED ORDER — ISOSORBIDE MONONITRATE ER 30 MG PO TB24: 30.0000 mg | ORAL_TABLET | Freq: Every day | ORAL | 3 refills | Status: DC

## 2018-12-13 MED ORDER — INSULIN ASPART 100 UNIT/ML ~~LOC~~ SOLN
3.0000 [IU] | Freq: Once | SUBCUTANEOUS | Status: AC
  Administered 2018-12-13: 3 [IU] via SUBCUTANEOUS

## 2018-12-13 MED ORDER — HYDRALAZINE HCL 50 MG PO TABS: 50.0000 mg | ORAL_TABLET | Freq: Three times a day (TID) | ORAL | 5 refills | Status: DC

## 2018-12-13 MED ORDER — ASPIRIN 81 MG PO CHEW
324.0000 mg | CHEWABLE_TABLET | Freq: Once | ORAL | Status: AC
  Administered 2018-12-13: 324 mg via ORAL
  Filled 2018-12-13: qty 4

## 2018-12-13 MED ORDER — SODIUM CHLORIDE 0.9% FLUSH
3.0000 mL | Freq: Two times a day (BID) | INTRAVENOUS | Status: DC
  Administered 2018-12-13 (×2): 3 mL via INTRAVENOUS

## 2018-12-13 MED ORDER — HYDRALAZINE HCL 20 MG/ML IJ SOLN
10.0000 mg | Freq: Four times a day (QID) | INTRAMUSCULAR | Status: DC | PRN
  Administered 2018-12-13: 10 mg via INTRAVENOUS
  Filled 2018-12-13: qty 1

## 2018-12-13 MED ORDER — INSULIN ASPART 100 UNIT/ML ~~LOC~~ SOLN
0.0000 [IU] | Freq: Three times a day (TID) | SUBCUTANEOUS | Status: DC
  Administered 2018-12-13: 5 [IU] via SUBCUTANEOUS
  Administered 2018-12-13: 7 [IU] via SUBCUTANEOUS

## 2018-12-13 MED ORDER — MONTELUKAST SODIUM 10 MG PO TABS
10.0000 mg | ORAL_TABLET | Freq: Every day | ORAL | Status: DC
  Administered 2018-12-13: 10 mg via ORAL
  Filled 2018-12-13: qty 1

## 2018-12-13 MED ORDER — INSULIN ASPART 100 UNIT/ML ~~LOC~~ SOLN: 0.0000 [IU] | Freq: Every day | SUBCUTANEOUS | Status: DC

## 2018-12-13 MED ORDER — HYPROMELLOSE (GONIOSCOPIC) 2.5 % OP SOLN
1.0000 [drp] | Freq: Three times a day (TID) | OPHTHALMIC | Status: DC | PRN
  Filled 2018-12-13: qty 15

## 2018-12-13 MED ORDER — SODIUM CHLORIDE 0.9 % IV SOLN: 250.0000 mL | INTRAVENOUS | Status: DC | PRN

## 2018-12-13 MED ORDER — AMLODIPINE BESYLATE 5 MG PO TABS
10.0000 mg | ORAL_TABLET | Freq: Every day | ORAL | Status: DC
  Administered 2018-12-13: 10 mg via ORAL
  Filled 2018-12-13 (×2): qty 2

## 2018-12-13 MED ORDER — HYDRALAZINE HCL 25 MG PO TABS
25.0000 mg | ORAL_TABLET | Freq: Three times a day (TID) | ORAL | Status: DC
  Administered 2018-12-13: 25 mg via ORAL
  Filled 2018-12-13: qty 1

## 2018-12-13 MED ORDER — METOPROLOL TARTRATE 50 MG PO TABS: 50.0000 mg | ORAL_TABLET | Freq: Two times a day (BID) | ORAL | 5 refills | Status: DC

## 2018-12-13 MED ORDER — ACETAMINOPHEN 650 MG RE SUPP: 650.0000 mg | Freq: Four times a day (QID) | RECTAL | Status: DC | PRN

## 2018-12-13 MED ORDER — METOPROLOL TARTRATE 50 MG PO TABS
50.0000 mg | ORAL_TABLET | Freq: Two times a day (BID) | ORAL | Status: DC
  Administered 2018-12-13 (×2): 50 mg via ORAL
  Filled 2018-12-13 (×2): qty 1

## 2018-12-13 MED ORDER — AMLODIPINE BESYLATE 10 MG PO TABS: 10.0000 mg | ORAL_TABLET | Freq: Every day | ORAL | 3 refills | Status: DC

## 2018-12-13 MED ORDER — ISOSORBIDE MONONITRATE ER 60 MG PO TB24
30.0000 mg | ORAL_TABLET | Freq: Every day | ORAL | Status: DC
  Administered 2018-12-13: 30 mg via ORAL
  Filled 2018-12-13: qty 1

## 2018-12-13 NOTE — Progress Notes (Addendum)
Inpatient Diabetes Program Recommendations  AACE/ADA: New Consensus Statement on Inpatient Glycemic Control (2015)  Target Ranges:  Prepandial:   less than 140 mg/dL      Peak postprandial:   less than 180 mg/dL (1-2 hours)      Critically ill patients:  140 - 180 mg/dL   Lab Results  Component Value Date   GLUCAP 323 (H) 12/13/2018   HGBA1C 7.7 (H) 12/07/2018    Review of Glycemic Control Results for Leah Ferrell, Leah Ferrell (MRN [id number]) as of 12/13/2018 11:34  Ref. Range 12/13/2018 04:13 12/13/2018 07:54 12/13/2018 11:16  Glucose-Capillary Latest Ref Range: 70 - 99 mg/dL 234 (H) 253 (H) 323 (H)   Diabetes history: DM 2 Outpatient Diabetes medications: Humalog 50 units tid with meals Current orders for Inpatient glycemic control:  Novolog 0-9 units tid + hs  Inpatient Diabetes Program Recommendations:    Glucose in the 300 range.   -  Consider an A1c level to determine glucose control over the past 2-3 months.  -  Consider Lantus 12-15 units   -  Also consider Novolog 6 units tid meal coverage if eating at least 50% of meals.  Thanks,  Tama Headings RN, MSN, BC-ADM Inpatient Diabetes Coordinator Team Pager [phone] (8a-5p)

## 2018-12-13 NOTE — Progress Notes (Signed)
Nsg Discharge Note  Admit Date:  12/12/2018 Discharge date: 12/13/2018   Leah Ferrell to be D/C'd Home  per MD order.  AVS completed.  Copy for chart, and copy for patient signed, and dated. Patient/caregiver able to verbalize understanding.  Discharge Medication: Allergies as of 12/13/2018      Reactions   Tape Rash      Medication List    TAKE these medications   acetaminophen 325 MG tablet Commonly known as: TYLENOL Take 2 tablets (650 mg total) by mouth every 6 (six) hours as needed for mild pain (or Fever >/= 101).   albuterol 108 (90 Base) MCG/ACT inhaler Commonly known as: Proventil HFA INHALE ONE TO TWO PUFFS BY  MOUTH EVERY 6 HOURS AS  NEEDED FOR WHEEZING OR FOR  SHORTNESS OF BREATH What changed:   how much to take  how to take this  when to take this  reasons to take this  additional instructions   amLODipine 10 MG tablet Commonly known as: NORVASC Take 1 tablet (10 mg total) by mouth daily.   aspirin EC 81 MG tablet Take 81 mg by mouth every morning.   Caltrate 600+D Plus Minerals 600-800 MG-UNIT Tabs Take 1 tablet by mouth 3 (three) times daily.   clopidogrel 75 MG tablet Commonly known as: PLAVIX Take 1 tablet (75 mg total) by mouth daily.   fish oil-omega-3 fatty acids 1000 MG capsule Take 1 g by mouth every morning.   furosemide 40 MG tablet Commonly known as: LASIX TAKE 1 TABLET BY MOUTH  DAILY MAY TAKE ADDITIONAL  TABLET AS NEEDED FOR  SWELLING What changed: See the new instructions.   HumaLOG KwikPen 200 UNIT/ML Sopn Generic drug: Insulin Lispro INJECT SUBCUTANEOUSLY 50  UNITS 3 TIMES DAILY BEFORE  MEALS What changed: See the new instructions.   hydrALAZINE 50 MG tablet Commonly known as: APRESOLINE Take 1 tablet (50 mg total) by mouth 3 (three) times daily. What changed:   medication strength  how much to take   hydroxypropyl methylcellulose / hypromellose 2.5 % ophthalmic solution Commonly known as: ISOPTO TEARS /  GONIOVISC Place 1 drop into both eyes 3 (three) times daily as needed (for dry eyes).   isosorbide mononitrate 30 MG 24 hr tablet Commonly known as: IMDUR Take 1 tablet (30 mg total) by mouth daily. Start taking on: December 14, 2018   lisinopril 20 MG tablet Commonly known as: ZESTRIL Take 2 tablets (40 mg total) by mouth daily.   metoprolol tartrate 50 MG tablet Commonly known as: LOPRESSOR Take 1 tablet (50 mg total) by mouth 2 (two) times daily. What changed:   medication strength  how much to take   montelukast 10 MG tablet Commonly known as: SINGULAIR TAKE 1 TABLET BY MOUTH  DAILY WITH BREAKFAST What changed: when to take this   mupirocin ointment 2 % Commonly known as: Bactroban Apply 1 application topically 2 (two) times daily.   nitroGLYCERIN 0.4 MG SL tablet Commonly known as: NITROSTAT Place 1 tablet (0.4 mg total) under the tongue every 5 (five) minutes x 3 doses as needed for chest pain (up to 3 doses).   potassium chloride SA 20 MEQ tablet Commonly known as: KLOR-CON Take 20 meq daily, take extra 10 meq if you take extra lasix   ReliOn Pen Needles 29G X 12MM Misc Generic drug: Insulin Pen Needle USE 1 NEEDLE TO INJECT INSULIN SUBCUTANEOUSLY 3 TIMES DAILY What changed: See the new instructions.   BD ULTRA-FINE PEN NEEDLES 29G  X 12.7MM Misc Generic drug: Insulin Pen Needle USE PENNEEDLES 3 TIMES  DAILY AS DIRECTED What changed: Another medication with the same name was changed. Make sure you understand how and when to take each.   rosuvastatin 40 MG tablet Commonly known as: CRESTOR Take 1 tablet (40 mg total) by mouth daily.   vitamin C 1000 MG tablet Take 1,000 mg by mouth 3 (three) times daily.   VITAMIN D PO Take 1 capsule by mouth daily.       Discharge Assessment: Vitals:   12/13/18 1256 12/13/18 1500  BP: (!) 169/48 (!) 131/39  Pulse: (!) 59 (!) 57  Resp: 18 18  Temp:    SpO2:  95%   Skin clean, dry and intact without evidence of  skin break down, no evidence of skin tears noted. IV catheter discontinued intact. Site without signs and symptoms of complications - no redness or edema noted at insertion site, patient denies c/o pain - only slight tenderness at site.  Dressing with slight pressure applied.  D/c Instructions-Education: Discharge instructions given to patient/family with verbalized understanding. D/c education completed with patient/family including follow up instructions, medication list, d/c activities limitations if indicated, with other d/c instructions as indicated by MD - patient able to verbalize understanding, all questions fully answered. Patient instructed to return to ED, call 911, or call MD for any changes in condition.  Patient escorted via [place], and D/C home via private auto.  Berton Bon, RN 12/13/2018 3:32 PM

## 2018-12-13 NOTE — Discharge Instructions (Signed)
1) please note that your blood pressure medications have been adjusted--- 2) please document your blood pressure readings daily and take these blood pressure readings chart/diary with you when you see your doctor in 1 to 2 weeks for recheck and reevaluation 3)Avoid ibuprofen/Advil/Aleve/Motrin/Goody Powders/Naproxen/BC powders/Meloxicam/Diclofenac/Indomethacin and other Nonsteroidal anti-inflammatory medications as these will make you more likely to bleed and can cause stomach ulcers, can also cause Kidney problems.

## 2018-12-13 NOTE — Discharge Summary (Signed)
Leah Ferrell, is a 68 y.o. female  DOB 08-05-1950  MRN [id number].  Admission date:  12/12/2018  Admitting Physician  Jani Gravel, MD  Discharge Date:  12/13/2018   Primary MD  Sharion Balloon, FNP  Recommendations for primary care physician for things to follow:    - 1) please note that your blood pressure medications have been adjusted--- 2) please document your blood pressure readings daily and take these blood pressure readings chart/diary with you when you see your doctor in 1 to 2 weeks for recheck and reevaluation 3)Avoid ibuprofen/Advil/Aleve/Motrin/Goody Powders/Naproxen/BC powders/Meloxicam/Diclofenac/Indomethacin and other Nonsteroidal anti-inflammatory medications as these will make you more likely to bleed and can cause stomach ulcers, can also cause Kidney problems.   Admission Diagnosis  Hypertensive urgency [I16.0]   Discharge Diagnosis  Hypertensive urgency [I16.0]    Principal Problem:   Hypertensive urgency Active Problems:   Hypertension associated with diabetes ([place])   Diabetes mellitus with peripheral vascular disease ([place])   Hyperlipidemia associated with type 2 diabetes mellitus ([place])   CAD (coronary artery disease)   Headache      Past Medical History:  Diagnosis Date  . CAD (coronary artery disease)    a. 04/2012 NSTEMI: s/p DES to LAD.  Marland Kitchen Chronic diastolic CHF (congestive heart failure) ([place])   . Diabetes mellitus without complication ([place])   . Dysrhythmia   . Environmental and seasonal allergies   . Hyperlipidemia   . Hypertension   . Leukocytosis   . Morbid obesity ([place])   . NSTEMI (non-ST elevated myocardial infarction) ([place]) 04/27/2012  . PONV (postoperative nausea and vomiting)   . PVD (peripheral vascular disease) ([place])    a. 04/2012 ABI: R 0.49, L 0.39. Angiography 06/14: Significant ostial left common iliac artery stenosis extending into the distal  aorta, severe left common femoral artery stenosis, bilateral SFA occlusion with heavy calcifications. Functionally, one-vessel runoff bilaterally below the knee  . Shortness of breath     Past Surgical History:  Procedure Laterality Date  . ABDOMINAL AORTAGRAM N/A 07/21/2012   Procedure: ABDOMINAL Maxcine Ham;  Surgeon: Wellington Hampshire, MD;  Location: [facility] CATH LAB;  Service: Cardiovascular;  Laterality: N/A;  . CARDIAC CATHETERIZATION     stents X 2  . CORONARY ANGIOGRAM  04/27/2012   Procedure: CORONARY ANGIOGRAM;  Surgeon: Thayer Headings, MD;  Location: [facility] C[facility] LAB;  Service: Cardiovascular;;  . ENDARTERECTOMY FEMORAL Left 11/02/2013   Procedure: RESECTION LEFT COMMON FEMORAL ARTERY AND INSERTION OF INTERPOSITION 64mm HEMASHIELD GRAFT FROM LEFT EXTERNAL  ILIAC ARTERY TO LEFT PROFUNDA  FEMORIS ARTERY;  Surgeon: Mal Misty, MD;  Location: [facility];  Service: Vascular;  Laterality: Left;  . EYE SURGERY Right 2017  . FRACTURE SURGERY Right Jul 07, 2014   Right Foot-  Pt.f[place]  at Home  by Heat duct  . ILIAC ARTERY STENT Left 08/31/2013  . LAD stent    . LEFT HEART CATHETERIZATION WITH CORONARY ANGIOGRAM N/A 04/23/2012   Procedure: LEFT HEART CATHETERIZATION WITH CORONARY ANGIOGRAM;  Surgeon:  Peter M Martinique, MD;  Location: [facility] CATH LAB;  Service: Cardiovascular;  Laterality: N/A;  . LOWER EXTREMITY ANGIOGRAM Left 08/31/2013   Procedure: LOWER EXTREMITY ANGIOGRAM;  Surgeon: Wellington Hampshire, MD;  Location: [facility] CATH LAB;  Service: Cardiovascular;  Laterality: Left;  . PERCUTANEOUS STENT INTERVENTION Left 08/31/2013   Procedure: PERCUTANEOUS STENT INTERVENTION;  Surgeon: Wellington Hampshire, MD;  Location: [facility] CATH LAB;  Service: Cardiovascular;  Laterality: Left;  Common Iliac artery  . PERIPHERAL VASCULAR CATHETERIZATION N/A 03/19/2016   Procedure: Abdominal Aortogram w/Lower Extremity;  Surgeon: Wellington Hampshire, MD;  Location: [facility] CV LAB;  Service: Cardiovascular;  Laterality: N/A;  . PERIPHERAL  VASCULAR CATHETERIZATION  03/19/2016   Procedure: Peripheral Vascular Balloon Angioplasty-CFA Left;  Surgeon: Wellington Hampshire, MD;  Location: [facility] CV LAB;  Service: Cardiovascular;;  . TUMOR REMOVAL     tumor removed from [place]       HPI  from the history and physical done on the day of admission:   -Leah Ferrell  is a 68 y.o. female, w hypertension, hyperlipidemia, Dm2, CAD w h/o NSTEMI, chronic CHF (diastolic), PVD h/o [id number] stenosis of left common femoral artery s/p angioplasty , morbid obeisity apparently c/o elevated bp associated w headache right sided for the past 3 days. Slight dizziness.  Chest pain "heaviness" for the past Hour while in the ED. Denies fever, chills, cough, palp, sob, n/v, diarrhea, brbpr.   Pt has been out of her amlodipine for the past 1 month.    In ED,  T 97.6, P 58 R 23, Bp 225/54 pox 94% on RA Wt 94.3 kg  CT brain IMPRESSION: No acute intracranial abnormality.  Mild chronic microvascular angiopathy changes.  Wbc 12.4, Hgb 14.4, Plt 277 Na 137, K 4.0, Bun 14, creatinine 0.69 Trop 42 -> 60    ED spoke with cardiology fellow who per ED recommended repeating troponin, and that patient can stay at [facility] for evaluation of elevated troponin likely related to d  Pt will be admitted for hypertensive urgency and elevated troponin.        Hospital Course:       1)-Hypertensive urgency w headache -Initially was on nicardipine drip, bps improved significantly, -Okay to discharge home on amlodipine 10 mg daily, lisinopril 20 mg daily, metoprolol 50 mg twice daily, hydralazine 50 mg 3 times daily and Imdur 30 mg daily   2)Elevated troponin, CAD w h/o NSTEMI, PVD -Patient without chest pain, suspect demand ischemia in the setting of uncontrolled BP, troponin peaked at 60, repeat troponin is now 34 -Patient remains chest pain-free, no shortness of breath, -Echocardiogram with preserved EF over [id number] with diastolic dysfunction but no significant  wall motion abnormalities -Cont aspirin, plavix Cont Lopressor as above Cont Crestor 40mg  po qhs  Dm2-recent A1c 7.7 reflecting inadequate diabetic control, Cont Humalog 50 units tid ac meals.  -Lifestyle and dietary modifications discussed, follow-up with PCP for ongoing management  Discharge Condition: Stable, chest pain-free, -Plan of care discussed extensively with patient's daughter and patient prior to discharge  Follow UP--PCP as advised   Diet and Activity recommendation:  As advised  Discharge Instructions    Discharge Instructions    Call MD for:  difficulty breathing, headache or visual disturbances   Complete by: As directed    Call MD for:  extreme fatigue   Complete by: As directed    Call MD for:  persistant dizziness or light-headedness   Complete by: As directed    Call  MD for:  persistant nausea and vomiting   Complete by: As directed    Call MD for:  severe uncontrolled pain   Complete by: As directed    Call MD for:  temperature >100.4   Complete by: As directed    Diet - low sodium heart healthy   Complete by: As directed    Diet Carb Modified   Complete by: As directed    Discharge instructions   Complete by: As directed    1) please note that your blood pressure medications have been adjusted--- 2) please document your blood pressure readings daily and take these blood pressure readings chart/diary with you when you see your doctor in 1 to 2 weeks for recheck and reevaluation 3)Avoid ibuprofen/Advil/Aleve/Motrin/Goody Powders/Naproxen/BC powders/Meloxicam/Diclofenac/Indomethacin and other Nonsteroidal anti-inflammatory medications as these will make you more likely to bleed and can cause stomach ulcers, can also cause Kidney problems.   Increase activity slowly   Complete by: As directed         Discharge Medications     Allergies as of 12/13/2018      Reactions   Tape Rash      Medication List    TAKE these medications   acetaminophen  325 MG tablet Commonly known as: TYLENOL Take 2 tablets (650 mg total) by mouth every 6 (six) hours as needed for mild pain (or Fever >/= 101).   albuterol 108 (90 Base) MCG/ACT inhaler Commonly known as: Proventil HFA INHALE ONE TO TWO PUFFS BY  MOUTH EVERY 6 HOURS AS  NEEDED FOR WHEEZING OR FOR  SHORTNESS OF BREATH What changed:   how much to take  how to take this  when to take this  reasons to take this  additional instructions   amLODipine 10 MG tablet Commonly known as: NORVASC Take 1 tablet (10 mg total) by mouth daily.   aspirin EC 81 MG tablet Take 81 mg by mouth every morning.   Caltrate 600+D Plus Minerals 600-800 MG-UNIT Tabs Take 1 tablet by mouth 3 (three) times daily.   clopidogrel 75 MG tablet Commonly known as: PLAVIX Take 1 tablet (75 mg total) by mouth daily.   fish oil-omega-3 fatty acids 1000 MG capsule Take 1 g by mouth every morning.   furosemide 40 MG tablet Commonly known as: LASIX TAKE 1 TABLET BY MOUTH  DAILY MAY TAKE ADDITIONAL  TABLET AS NEEDED FOR  SWELLING What changed: See the new instructions.   HumaLOG KwikPen 200 UNIT/ML Sopn Generic drug: Insulin Lispro INJECT SUBCUTANEOUSLY 50  UNITS 3 TIMES DAILY BEFORE  MEALS What changed: See the new instructions.   hydrALAZINE 50 MG tablet Commonly known as: APRESOLINE Take 1 tablet (50 mg total) by mouth 3 (three) times daily. What changed:   medication strength  how much to take   hydroxypropyl methylcellulose / hypromellose 2.5 % ophthalmic solution Commonly known as: ISOPTO TEARS / GONIOVISC Place 1 drop into both eyes 3 (three) times daily as needed (for dry eyes).   isosorbide mononitrate 30 MG 24 hr tablet Commonly known as: IMDUR Take 1 tablet (30 mg total) by mouth daily. Start taking on: December 14, 2018   lisinopril 20 MG tablet Commonly known as: ZESTRIL Take 2 tablets (40 mg total) by mouth daily.   metoprolol tartrate 50 MG tablet Commonly known as: LOPRESSOR  Take 1 tablet (50 mg total) by mouth 2 (two) times daily. What changed:   medication strength  how much to take   montelukast 10 MG tablet  Commonly known as: SINGULAIR TAKE 1 TABLET BY MOUTH  DAILY WITH BREAKFAST What changed: when to take this   mupirocin ointment 2 % Commonly known as: Bactroban Apply 1 application topically 2 (two) times daily.   nitroGLYCERIN 0.4 MG SL tablet Commonly known as: NITROSTAT Place 1 tablet (0.4 mg total) under the tongue every 5 (five) minutes x 3 doses as needed for chest pain (up to 3 doses).   potassium chloride SA 20 MEQ tablet Commonly known as: KLOR-CON Take 20 meq daily, take extra 10 meq if you take extra lasix   ReliOn Pen Needles 29G X 12MM Misc Generic drug: Insulin Pen Needle USE 1 NEEDLE TO INJECT INSULIN SUBCUTANEOUSLY 3 TIMES DAILY What changed: See the new instructions.   BD ULTRA-FINE PEN NEEDLES 29G X 12.7MM Misc Generic drug: Insulin Pen Needle USE PENNEEDLES 3 TIMES  DAILY AS DIRECTED What changed: Another medication with the same name was changed. Make sure you understand how and when to take each.   rosuvastatin 40 MG tablet Commonly known as: CRESTOR Take 1 tablet (40 mg total) by mouth daily.   vitamin C 1000 MG tablet Take 1,000 mg by mouth 3 (three) times daily.   VITAMIN D PO Take 1 capsule by mouth daily.       Major procedures and Radiology Reports - PLEASE review detailed and final reports for all details, in brief -   Ct Head Wo Contrast  Result Date: 12/12/2018 CLINICAL DATA:  Headache, normal neuro exam EXAM: CT HEAD WITHOUT CONTRAST TECHNIQUE: Contiguous axial images were obtained from the base of the skull through the vertex without intravenous contrast. COMPARISON:  CT 03/01/2015 FINDINGS: Brain: No evidence of acute infarction, hemorrhage, hydrocephalus, extra-axial collection or mass lesion/mass effect. Patchy areas of white matter hypoattenuation are most compatible with chronic microvascular  angiopathy. Vascular: Atherosclerotic calcification of the carotid siphons and intradural vertebral arteries. No hyperdense vessel. Skull: No calvarial fracture or suspicious osseous lesion. No scalp swelling or hematoma. Sinuses/Orbits: Paranasal sinuses and mastoid air cells are predominantly clear. Right lens extraction. Included portions of the orbits are otherwise unremarkable. Other: None IMPRESSION: No acute intracranial abnormality. Mild chronic microvascular angiopathy changes. Electronically Signed   By: Lovena Le M.D.   On: 12/12/2018 22:48    Micro Results   No results found for this or any previous visit (from the past 240 hour(s)).     Today   Subjective    Deondra Landa today has no new complaints No chest pain, no palpitations, no dizziness, no shortness of breath, no dyspnea on exertion -Patient feels a lot better, daughter at bedside, eager to go home          Patient has been seen and examined prior to discharge   Objective   Blood pressure (!) 131/39, pulse (!) 57, temperature 98.7 F (37.1 C), temperature source Oral, resp. rate 18, height 5\' 2"  (1.575 m), weight 95.2 kg, SpO2 95 %.   Intake/Output Summary (Last 24 hours) at 12/13/2018 1511 Last data filed at 12/13/2018 0300 Gross per 24 hour  Intake 3 ml  Output -  Net 3 ml    Exam Gen:- Awake Alert, no acute distress, obese HEENT:- [place].AT, No sclera icterus Neck-Supple Neck,No JVD,.  Lungs-  CTAB , good air movement bilaterally  CV- S1, S2 normal, regular Abd-  +ve B.Sounds, Abd Soft, No tenderness,    Extremity/Skin:- No  edema,   good pulses Psych-affect is appropriate, oriented x3 Neuro-no new focal deficits, no  tremors    Data Review   CBC w Diff:  Lab Results  Component Value Date   WBC 12.7 (H) 12/13/2018   HGB 13.6 12/13/2018   HGB 15.0 12/07/2018   HCT 42.5 12/13/2018   HCT 45.6 12/07/2018   PLT 250 12/13/2018   PLT 297 12/07/2018   LYMPHOPCT 23 12/12/2018   MONOPCT 8  12/12/2018   EOSPCT 2 12/12/2018   BASOPCT 0 12/12/2018    CMP:  Lab Results  Component Value Date   NA 138 12/13/2018   NA 141 12/07/2018   K 3.8 12/13/2018   CL 97 (L) 12/13/2018   CO2 29 12/13/2018   BUN 16 12/13/2018   BUN 6 (L) 12/07/2018   CREATININE 0.80 12/13/2018   CREATININE 0.78 03/11/2016   PROT 7.0 12/13/2018   PROT 7.6 12/07/2018   ALBUMIN 3.5 12/13/2018   ALBUMIN 4.2 12/07/2018   BILITOT 0.4 12/13/2018   BILITOT 0.3 12/07/2018   ALKPHOS 41 12/13/2018   AST 19 12/13/2018   ALT 19 12/13/2018  .   Total Discharge time is about 33 minutes  Roxan Hockey M.D on 12/13/2018 at 3:11 PM  Go to [email or web address] -  for contact info  [facility] - Office  [phone]

## 2018-12-13 NOTE — ED Provider Notes (Signed)
[facility] EMERGENCY DEPARTMENT Provider Note   CSN: [id number] Arrival date & time: 12/12/18  2109     History   Chief Complaint Chief Complaint  Patient presents with  . Hypertension    HPI Leah Ferrell is a 68 y.o. female with a history of CAD with a history of a non-STEMI with stenting in 2014, peripheral vascular disease, CHF, diabetes, hypertension and hyperlipidemia presenting with uncontrolled blood pressure of 123456 max systolic with multiple checks at home today.  She reports having intermittent right sided sharp headache pains for the past 3 days and reports dizziness.  She is on multiple bp medications but ran out of her amlodipine about 1 month ago (her mail order co will not send it, telling her it was too early to fill).  Regardless, she increased her metoprolol from 50 mg to 100 mg to make up for the missing amlodipine.  She denies  n/v, abdominal pain, no vision changes, no sob or peripheral edema. She does report intermittent episodes of fleeting midsternal chest pain which she states improves with drinking soda followed by eructation, no cp currently, last occurring earlier today.     HPI  Past Medical History:  Diagnosis Date  . CAD (coronary artery disease)    a. 04/2012 NSTEMI: s/p DES to LAD.  Marland Kitchen Chronic diastolic CHF (congestive heart failure) ([place])   . Diabetes mellitus without complication ([place])   . Dysrhythmia   . Environmental and seasonal allergies   . Hyperlipidemia   . Hypertension   . Leukocytosis   . Morbid obesity ([place])   . NSTEMI (non-ST elevated myocardial infarction) ([place]) 04/27/2012  . PONV (postoperative nausea and vomiting)   . PVD (peripheral vascular disease) ([place])    a. 04/2012 ABI: R 0.49, L 0.39. Angiography 06/14: Significant ostial left common iliac artery stenosis extending into the distal aorta, severe left common femoral artery stenosis, bilateral SFA occlusion with heavy calcifications. Functionally, one-vessel runoff  bilaterally below the knee  . Shortness of breath     Patient Active Problem List   Diagnosis Date Noted  . Hypertensive urgency 12/13/2018  . Osteopenia 04/27/2018  . Hypoglycemia 12/02/2017  . Acute on chronic diastolic CHF (congestive heart failure) ([place]) 12/02/2017  . Acute on chronic diastolic congestive heart failure ([place]) 12/01/2017  . Dizziness 12/01/2017  . Morbid obesity ([place]) 06/10/2017  . PMB (postmenopausal bleeding) 11/12/2015  . Hypokalemia 11/23/2014  . Leg pain, left 07/06/2014  . Vaginal atrophy 05/08/2014  . Left leg swelling 01/17/2014  . PAD (peripheral artery disease) ([place]) 10/18/2013  . Abnormal stress test 07/18/2013  . PVD (peripheral vascular disease) ([place]) 07/12/2013  . S/P primary angioplasty with coronary stent 07/12/2013  . Diabetes 1.5, managed as type 2 ([place]) 07/12/2013  . Chronic diastolic heart failure ([place]) 08/27/2012  . Carotid bruit 08/27/2012  . Angina pectoris ([place]) 04/30/2012  . CAD (coronary artery disease) 04/28/2012  . Hx of non-ST elevation myocardial infarction (NSTEMI) 04/23/2012  . Hypertension associated with diabetes ([place])   . Diabetes mellitus with peripheral vascular disease ([place])   . Hyperlipidemia associated with type 2 diabetes mellitus [facility])     Past Surgical History:  Procedure Laterality Date  . ABDOMINAL AORTAGRAM N/A 07/21/2012   Procedure: ABDOMINAL Maxcine Ham;  Surgeon: Wellington Hampshire, MD;  Location: [place] CATH LAB;  Service: Cardiovascular;  Laterality: N/A;  . CARDIAC CATHETERIZATION     stents X 2  . CORONARY ANGIOGRAM  04/27/2012   Procedure: CORONARY ANGIOGRAM;  Surgeon: Arnette Norris  Deboraha Sprang, MD;  Location: [place] CATH LAB;  Service: Cardiovascular;;  . ENDARTERECTOMY FEMORAL Left 11/02/2013   Procedure: RESECTION LEFT COMMON FEMORAL ARTERY AND INSERTION OF INTERPOSITION 20mm HEMASHIELD GRAFT FROM LEFT EXTERNAL  ILIAC ARTERY TO LEFT PROFUNDA  FEMORIS ARTERY;  Surgeon: Mal Misty, MD;  Location: [place];  Service: Vascular;   Laterality: Left;  . EYE SURGERY Right 2017  . FRACTURE SURGERY Right Jul 07, 2014   Right Foot-  [place]  at Home  by Heat duct  . ILIAC ARTERY STENT Left 08/31/2013  . LAD stent    . LEFT HEART CATHETERIZATION WITH CORONARY ANGIOGRAM N/A 04/23/2012   Procedure: LEFT HEART CATHETERIZATION WITH CORONARY ANGIOGRAM;  Surgeon: Peter M Martinique, MD;  Location: [facility] CATH LAB;  Service: Cardiovascular;  Laterality: N/A;  . LOWER EXTREMITY ANGIOGRAM Left 08/31/2013   Procedure: LOWER EXTREMITY ANGIOGRAM;  Surgeon: Wellington Hampshire, MD;  Location: [place] CATH LAB;  Service: Cardiovascular;  Laterality: Left;  . PERCUTANEOUS STENT INTERVENTION Left 08/31/2013   Procedure: PERCUTANEOUS STENT INTERVENTION;  Surgeon: Wellington Hampshire, MD;  Location: [place] CATH LAB;  Service: Cardiovascular;  Laterality: Left;  Common Iliac artery  . PERIPHERAL VASCULAR CATHETERIZATION N/A 03/19/2016   Procedure: Abdominal Aortogram w/Lower Extremity;  Surgeon: Wellington Hampshire, MD;  Location: [place] CV LAB;  Service: Cardiovascular;  Laterality: N/A;  . PERIPHERAL VASCULAR CATHETERIZATION  03/19/2016   Procedure: Peripheral Vascular Balloon Angioplasty-CFA Left;  Surgeon: Wellington Hampshire, MD;  Location: [place] CV LAB;  Service: Cardiovascular;;  . 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 Authorizing Provider  albuterol (PROVENTIL HFA) 108 (90 Base) MCG/ACT inhaler INHALE ONE TO TWO PUFFS BY  MOUTH EVERY 6 HOURS AS  NEEDED FOR WHEEZING OR FOR  SHORTNESS OF BREATH Patient taking differently: Inhale 1-2 puffs into the lungs every 6 (six) hours as needed for wheezing or shortness of breath.  03/02/17   Evelina Dun A, FNP  amLODipine (NORVASC) 10  MG tablet TAKE 1 TABLET BY MOUTH  DAILY 06/17/18   Evelina Dun A, FNP  Ascorbic Acid (VITAMIN C) 1000 MG tablet Take 1,000 mg by mouth 2 (two) times daily.    [provider]  aspirin EC 81 MG tablet Take 81 mg by mouth every morning.     [provider]  BD ULTRA-FINE PEN NEEDLES 29G X 12.7MM MISC USE PENNEEDLES 3 TIMES  DAILY AS DIRECTED 06/17/18   Evelina Dun A, FNP  Calcium Carbonate-Vit D-Min (CALTRATE 600+D PLUS MINERALS) 600-800 MG-UNIT TABS Take 1 tablet by mouth 3 (three) times daily.     [provider]  clopidogrel (PLAVIX) 75 MG tablet TAKE 1 TABLET BY MOUTH  DAILY 10/27/18   Hawks, Alyse Low A, FNP  fish oil-omega-3 fatty acids 1000 MG capsule Take 1 g by mouth every morning.    [provider]  furosemide (LASIX) 40  MG tablet TAKE 1 TABLET BY MOUTH  DAILY MAY TAKE ADDITIONAL  TABLET AS NEEDED FOR  SWELLING 06/17/18   Sharion Balloon, FNP  HUMALOG KWIKPEN 200 UNIT/ML SOPN INJECT SUBCUTANEOUSLY 50  UNITS 3 TIMES DAILY BEFORE  MEALS 12/08/18   Hawks, Christy A, FNP  hydrALAZINE (APRESOLINE) 25 MG tablet Take 25 mg by mouth 3 (three) times daily. 10/09/18   [provider]  hydroxypropyl methylcellulose (ISOPTO TEARS) 2.5 % ophthalmic solution Place 1 drop into both eyes 3 (three) times daily as needed (for dry eyes).    [provider]  lisinopril (ZESTRIL) 20 MG tablet TAKE 2 TABLETS BY MOUTH  DAILY 06/17/18   Evelina Dun A, FNP  metoprolol tartrate (LOPRESSOR) 100 MG tablet TAKE 1 TABLET BY MOUTH TWO  TIMES DAILY 11/16/18   Evelina Dun A, FNP  montelukast (SINGULAIR) 10 MG tablet TAKE 1 TABLET BY MOUTH  DAILY WITH BREAKFAST 06/17/18   Hawks, Christy A, FNP  mupirocin ointment (BACTROBAN) 2 % Apply 1 application topically 2 (two) times daily. 12/07/18   Sharion Balloon, FNP  nitroGLYCERIN (NITROSTAT) 0.4 MG SL tablet Place 1 tablet (0.4 mg total) under the tongue every 5 (five) minutes x 3 doses as needed for chest pain (up to 3  doses). 03/10/16   Sharion Balloon, FNP  potassium chloride SA (K-DUR) 20 MEQ tablet Take 20 meq daily, take extra 10 meq if you take extra lasix 09/27/18   Evelina Dun A, FNP  RELION PEN NEEDLES 29G X 12MM MISC USE 1 NEEDLE TO INJECT INSULIN SUBCUTANEOUSLY 3 TIMES DAILY 01/31/16   Evelina Dun A, FNP  rosuvastatin (CRESTOR) 40 MG tablet TAKE 1 TABLET BY MOUTH  DAILY 06/17/18   Sharion Balloon, FNP    Family History Family History  Problem Relation Age of Onset  . Diabetes Mother   . Hyperlipidemia Mother   . Hypertension Mother   . Peripheral vascular disease Mother   . Diabetes Maternal Grandmother   . Heart attack Maternal Grandfather   . Heart disease Brother   . Other Sister        drowned  . Diabetes Brother   . Alcohol abuse Brother   . Other Sister        drowned  . Diabetes Daughter        borderline  . Hypertension Daughter   . Diabetes Son   . Hypertension Son   . Hyperlipidemia Son     Social History Social History   Tobacco Use  . Smoking status: Former Smoker    Packs/day: 1.00    Years: 36.00    Pack years: 36.00    Types: Cigarettes    Start date: 04/21/1964    Quit date: 02/17/2001    Years since quitting: 17.8  . Smokeless tobacco: Never Used  Substance Use Topics  . Alcohol use: No    Alcohol/week: 0.0 standard drinks  . Drug use: No     Allergies   Tape   Review of Systems Review of Systems  Constitutional: Negative for fever.  HENT: Negative for congestion and sore throat.   Eyes: Negative.  Negative for visual disturbance.  Respiratory: Negative for chest tightness and shortness of breath.   Cardiovascular: Negative for chest pain.  Gastrointestinal: Negative for abdominal pain, nausea and vomiting.  Genitourinary: Negative.   Musculoskeletal: Negative for arthralgias, joint swelling and neck pain.  Skin: Negative.  Negative for rash and wound.  Neurological: Positive for dizziness and headaches. Negative  for weakness,  light-headedness and numbness.  Psychiatric/Behavioral: Negative.      Physical Exam Updated Vital Signs BP (!) 199/61   Pulse 63   Temp 97.6 F (36.4 C) (Oral)   Resp 10   Ht 5\' 2"  (1.575 m)   Wt 94.3 kg   SpO2 94%   BMI 38.04 kg/m   Physical Exam Vitals signs and nursing note reviewed.  Constitutional:      Appearance: She is well-developed.  HENT:     Head: Normocephalic and atraumatic.  Eyes:     Conjunctiva/sclera: Conjunctivae normal.     Pupils: Pupils are equal, round, and reactive to light.  Neck:     Musculoskeletal: Normal range of motion and neck supple.  Cardiovascular:     Rate and Rhythm: Normal rate and regular rhythm.     Heart sounds: Normal heart sounds.  Pulmonary:     Effort: Pulmonary effort is normal.     Breath sounds: Normal breath sounds. No wheezing or rales.  Abdominal:     General: Bowel sounds are normal.     Palpations: Abdomen is soft.     Tenderness: There is no abdominal tenderness.  Musculoskeletal: Normal range of motion.     Right lower leg: No edema.     Left lower leg: No edema.  Lymphadenopathy:     Cervical: No cervical adenopathy.  Skin:    General: Skin is warm and dry.     Findings: No rash.  Neurological:     Mental Status: She is alert and oriented to person, place, and time.     GCS: GCS eye subscore is 4. GCS verbal subscore is 5. GCS motor subscore is 6.     Sensory: No sensory deficit.     Gait: Gait normal.     Comments: Normal heel-shin, Cranial nerves III-XII intact.  No pronator drift.  Psychiatric:        Speech: Speech normal.        Behavior: Behavior normal.        Thought Content: Thought content normal.      ED Treatments / Results  Labs (all labs ordered are listed, but only abnormal results are displayed) Labs Reviewed  CBC WITH DIFFERENTIAL/PLATELET - Abnormal; Notable for the following components:      Result Value   WBC 12.4 (*)    Neutro Abs 8.2 (*)    All other components within  normal limits  BASIC METABOLIC PANEL - Abnormal; Notable for the following components:   Glucose, Bld 143 (*)    All other components within normal limits  TROPONIN I (HIGH SENSITIVITY) - Abnormal; Notable for the following components:   Troponin I (High Sensitivity) 42 (*)    All other components within normal limits  TROPONIN I (HIGH SENSITIVITY) - Abnormal; Notable for the following components:   Troponin I (High Sensitivity) 60 (*)    All other components within normal limits  HIV ANTIBODY (ROUTINE TESTING W REFLEX)  COMPREHENSIVE METABOLIC PANEL  CBC    EKG  ED ECG REPORT   Date: 12/13/2018  Rate: 59  Rhythm: normal sinus rhythm  QRS Axis: normal  Intervals: normal  ST/T Wave abnormalities: ST depressions laterally  Conduction Disutrbances:none  Narrative Interpretation:   Old EKG Reviewed: changes noted  I have personally reviewed the EKG tracing and agree with the computerized printout as noted.   Radiology Ct Head Wo Contrast  Result Date: 12/12/2018 CLINICAL DATA:  Headache, normal neuro exam EXAM: CT  HEAD WITHOUT CONTRAST TECHNIQUE: Contiguous axial images were obtained from the base of the skull through the vertex without intravenous contrast. COMPARISON:  CT 03/01/2015 FINDINGS: Brain: No evidence of acute infarction, hemorrhage, hydrocephalus, extra-axial collection or mass lesion/mass effect. Patchy areas of white matter hypoattenuation are most compatible with chronic microvascular angiopathy. Vascular: Atherosclerotic calcification of the carotid siphons and intradural vertebral arteries. No hyperdense vessel. Skull: No calvarial fracture or suspicious osseous lesion. No scalp swelling or hematoma. Sinuses/Orbits: Paranasal sinuses and mastoid air cells are predominantly clear. Right lens extraction. Included portions of the orbits are otherwise unremarkable. Other: None IMPRESSION: No acute intracranial abnormality. Mild chronic microvascular angiopathy changes.  Electronically Signed   By: Lovena Le M.D.   On: 12/12/2018 22:48    Procedures Procedures (including critical care time)  Medications Ordered in ED Medications  nicardipine (CARDENE) 20mg  in 0.86% saline 260ml IV infusion (0.1 mg/ml) (has no administration in time range)  enoxaparin (LOVENOX) injection 40 mg (has no administration in time range)  sodium chloride flush (NS) 0.9 % injection 3 mL (has no administration in time range)  sodium chloride flush (NS) 0.9 % injection 3 mL (has no administration in time range)  0.9 %  sodium chloride infusion (has no administration in time range)  acetaminophen (TYLENOL) tablet 650 mg (has no administration in time range)    Or  acetaminophen (TYLENOL) suppository 650 mg (has no administration in time range)  hydrALAZINE (APRESOLINE) injection 10 mg (10 mg Intravenous Given 12/12/18 2152)  hydrALAZINE (APRESOLINE) injection 10 mg (10 mg Intravenous Given 12/12/18 2302)     Initial Impression / Assessment and Plan / ED Course  I have reviewed the triage vital signs and the nursing notes.  Pertinent labs & imaging results that were available during my care of the patient were reviewed by me and considered in my medical decision making (see chart for details).        Pt with persistently elevated blood pressure with intermittent headache and chest pain (no current chest pain) but with change in delta troponin of 18, from 42 to 60.  Labs othewise stable, Ct head stable. She was given IV hydralazine with transient bp improvement.  Pulse in the upper 50's so beta blockers not chosen.  Cardene drip started.    Discussed with Dr. Maudie Mercury who will see and admit pt. Requests consult with cardiology to determine if pt can remain at [facility].   Discussed case with Dr. Paticia Stack.  Labs, ekg and hx reviewed.  Recommends continuing to trend troponins, suspects delta change here from demand ischemia.  ASA (ordered), would recommend echocardiogram during admission.    Advised Dr. Maudie Mercury of Dr. Rebecka Apley recommendations.   Final Clinical Impressions(s) / ED Diagnoses   Final diagnoses:  Hypertensive urgency    ED Discharge Orders    None       Landis Martins 12/13/18 0147    Milton Ferguson, MD 12/14/18 1153

## 2018-12-13 NOTE — Progress Notes (Signed)
  Echocardiogram 2D Echocardiogram has been performed.  Leah Ferrell M 12/13/2018, 10:48 AM

## 2018-12-13 NOTE — H&P (Signed)
TRH H&P    Patient Demographics:    Leah Ferrell, is a 68 y.o. female  MRN: [id number]  DOB - 11/27/1950  Admit Date - 12/12/2018  Referring MD/NP/PA:  Evalee Jefferson  Outpatient Primary MD for the patient is Sharion Balloon, FNP  Patient coming from:  home  Chief complaint- headache   HPI:    Leah Ferrell  is a 68 y.o. female, w hypertension, hyperlipidemia, Dm2, CAD w h/o NSTEMI, chronic CHF (diastolic), PVD h/o [id number] stenosis of left common femoral artery s/p angioplasty , morbid obeisity apparently c/o elevated bp associated w headache right sided for the past 3 days. Slight dizziness.  Chest pain "heaviness" for the past Hour while in the ED. Denies fever, chills, cough, palp, sob, n/v, diarrhea, brbpr.   Pt has been out of her amlodipine for the past 1 month.    In ED,  T 97.6, P 58 R 23, Bp 225/54 pox 94% on RA Wt 94.3 kg  CT brain IMPRESSION: No acute intracranial abnormality.  Mild chronic microvascular angiopathy changes.  Wbc 12.4, Hgb 14.4, Plt 277 Na 137, K 4.0, Bun 14, creatinine 0.69 Trop 42 -> 60    ED spoke with cardiology fellow who per ED recommended repeating troponin, and that patient can stay at [facility] for evaluation of elevated troponin likely related to d  Pt will be admitted for hypertensive urgency and elevated troponin.       Review of systems:    In addition to the HPI above,  No Fever-chills, No Headache, No changes with Vision or hearing, No problems swallowing food or Liquids, No Cough or Shortness of Breath, No Abdominal pain, No Nausea or Vomiting, bowel movements are regular, No Blood in stool or Urine, No dysuria, No new skin rashes or bruises, No new joints pains-aches,  No new weakness, tingling, numbness in any extremity, No recent weight gain or loss, No polyuria, polydypsia or polyphagia, No significant Mental Stressors.  All other systems  reviewed and are negative.    Past History of the following :    Past Medical History:  Diagnosis Date   CAD (coronary artery disease)    a. 04/2012 NSTEMI: s/p DES to LAD.   Chronic diastolic CHF (congestive heart failure) (HCC)    Diabetes mellitus without complication (HCC)    Dysrhythmia    Environmental and seasonal allergies    Hyperlipidemia    Hypertension    Leukocytosis    Morbid obesity (HCC)    NSTEMI (non-ST elevated myocardial infarction) ([place]) 04/27/2012   PONV (postoperative nausea and vomiting)    PVD (peripheral vascular disease) ([place])    a. 04/2012 ABI: R 0.49, L 0.39. Angiography 06/14: Significant ostial left common iliac artery stenosis extending into the distal aorta, severe left common femoral artery stenosis, bilateral SFA occlusion with heavy calcifications. Functionally, one-vessel runoff bilaterally below the knee   Shortness of breath       Past Surgical History:  Procedure Laterality Date   ABDOMINAL AORTAGRAM N/A 07/21/2012   Procedure: ABDOMINAL AORTAGRAM;  Surgeon: Wellington Hampshire, MD;  Location: [facility] CATH LAB;  Service: Cardiovascular;  Laterality: N/A;   CARDIAC CATHETERIZATION     stents X 2   CORONARY ANGIOGRAM  04/27/2012   Procedure: CORONARY ANGIOGRAM;  Surgeon: Thayer Headings, MD;  Location: [facility] CATH LAB;  Service: Cardiovascular;;   ENDARTERECTOMY FEMORAL Left 11/02/2013   Procedure: RESECTION LEFT COMMON FEMORAL ARTERY AND INSERTION OF INTERPOSITION 52mm HEMASHIELD GRAFT FROM LEFT EXTERNAL  ILIAC ARTERY TO LEFT PROFUNDA  FEMORIS ARTERY;  Surgeon: Mal Misty, MD;  Location: [place];  Service: Vascular;  Laterality: Left;   EYE SURGERY Right 2017   FRACTURE SURGERY Right Jul 07, 2014   Right Foot-  Pt.fell  at Home  by Heat duct   ILIAC ARTERY STENT Left 08/31/2013   LAD stent     LEFT HEART CATHETERIZATION WITH CORONARY ANGIOGRAM N/A 04/23/2012   Procedure: LEFT HEART CATHETERIZATION WITH CORONARY ANGIOGRAM;  Surgeon:  Peter M Martinique, MD;  Location: [facility] CATH LAB;  Service: Cardiovascular;  Laterality: N/A;   LOWER EXTREMITY ANGIOGRAM Left 08/31/2013   Procedure: LOWER EXTREMITY ANGIOGRAM;  Surgeon: Wellington Hampshire, MD;  Location: [place] CATH LAB;  Service: Cardiovascular;  Laterality: Left;   PERCUTANEOUS STENT INTERVENTION Left 08/31/2013   Procedure: PERCUTANEOUS STENT INTERVENTION;  Surgeon: Wellington Hampshire, MD;  Location: [place] CATH LAB;  Service: Cardiovascular;  Laterality: Left;  Common Iliac artery   PERIPHERAL VASCULAR CATHETERIZATION N/A 03/19/2016   Procedure: Abdominal Aortogram w/Lower Extremity;  Surgeon: Wellington Hampshire, MD;  Location: [place] CV LAB;  Service: Cardiovascular;  Laterality: N/A;   PERIPHERAL VASCULAR CATHETERIZATION  03/19/2016   Procedure: Peripheral Vascular Balloon Angioplasty-CFA Left;  Surgeon: Wellington Hampshire, MD;  Location: [place] CV LAB;  Service: Cardiovascular;;   TUMOR REMOVAL     tumor removed from [place]      Social History:      Social History   Tobacco Use   Smoking status: Former Smoker    Packs/day: 1.00    Years: 36.00    Pack years: 36.00    Types: Cigarettes    Start date: 04/21/1964    Quit date: 02/17/2001    Years since quitting: 17.8   Smokeless tobacco: Never Used  Substance Use Topics   Alcohol use: No    Alcohol/week: 0.0 standard drinks       Family History :     Family History  Problem Relation Age of Onset   Diabetes Mother    Hyperlipidemia Mother    Hypertension Mother    Peripheral vascular disease Mother    Diabetes Maternal Grandmother    Heart attack Maternal Grandfather    Heart disease Brother    Other Sister        drowned   Diabetes Brother    Alcohol abuse Brother    Other Sister        drowned   Diabetes Daughter        borderline   Hypertension Daughter    Diabetes Son    Hypertension Son    Hyperlipidemia Son        Home Medications:   Prior to Admission medications     Medication Sig Start Date End Date Taking? Authorizing Provider  albuterol (PROVENTIL HFA) 108 (90 Base) MCG/ACT inhaler INHALE ONE TO TWO PUFFS BY  MOUTH EVERY 6 HOURS AS  NEEDED FOR WHEEZING OR FOR  SHORTNESS OF BREATH Patient taking differently: Inhale 1-2 puffs into the lungs every 6 (  six) hours as needed for wheezing or shortness of breath.  03/02/17   Evelina Dun A, FNP  amLODipine (NORVASC) 10 MG tablet TAKE 1 TABLET BY MOUTH  DAILY 06/17/18   Evelina Dun A, FNP  Ascorbic Acid (VITAMIN C) 1000 MG tablet Take 1,000 mg by mouth 2 (two) times daily.    [provider]  aspirin EC 81 MG tablet Take 81 mg by mouth every morning.     [provider]  BD ULTRA-FINE PEN NEEDLES 29G X 12.7MM MISC USE PENNEEDLES 3 TIMES  DAILY AS DIRECTED 06/17/18   Evelina Dun A, FNP  Calcium Carbonate-Vit D-Min (CALTRATE 600+D PLUS MINERALS) 600-800 MG-UNIT TABS Take 1 tablet by mouth 3 (three) times daily.     [provider]  clopidogrel (PLAVIX) 75 MG tablet TAKE 1 TABLET BY MOUTH  DAILY 10/27/18   Hawks, Alyse Low A, FNP  fish oil-omega-3 fatty acids 1000 MG capsule Take 1 g by mouth every morning.    [provider]  furosemide (LASIX) 40 MG tablet TAKE 1 TABLET BY MOUTH  DAILY MAY TAKE ADDITIONAL  TABLET AS NEEDED FOR  SWELLING 06/17/18   Sharion Balloon, FNP  HUMALOG KWIKPEN 200 UNIT/ML SOPN INJECT SUBCUTANEOUSLY 50  UNITS 3 TIMES DAILY BEFORE  MEALS 12/08/18   Hawks, Christy A, FNP  hydrALAZINE (APRESOLINE) 25 MG tablet Take 25 mg by mouth 3 (three) times daily. 10/09/18   [provider]  hydroxypropyl methylcellulose (ISOPTO TEARS) 2.5 % ophthalmic solution Place 1 drop into both eyes 3 (three) times daily as needed (for dry eyes).    [provider]  lisinopril (ZESTRIL) 20 MG tablet TAKE 2 TABLETS BY MOUTH  DAILY 06/17/18   Evelina Dun A, FNP  metoprolol tartrate (LOPRESSOR) 100 MG tablet TAKE 1 TABLET BY MOUTH TWO  TIMES DAILY 11/16/18   Evelina Dun A, FNP  montelukast (SINGULAIR) 10 MG tablet TAKE 1 TABLET BY MOUTH  DAILY WITH BREAKFAST 06/17/18   Hawks, Christy A, FNP  mupirocin ointment (BACTROBAN) 2 % Apply 1 application topically 2 (two) times daily. 12/07/18   Sharion Balloon, FNP  nitroGLYCERIN (NITROSTAT) 0.4 MG SL tablet Place 1 tablet (0.4 mg total) under the tongue every 5 (five) minutes x 3 doses as needed for chest pain (up to 3 doses). 03/10/16   Evelina Dun A, FNP  potassium chloride SA (K-DUR) 20 MEQ tablet Take 20 meq daily, take extra 10 meq if you take extra lasix 09/27/18   Evelina Dun A, FNP  RELION PEN NEEDLES 29G X 12MM MISC USE 1 NEEDLE TO INJECT INSULIN SUBCUTANEOUSLY 3 TIMES DAILY 01/31/16   Evelina Dun A, FNP  rosuvastatin (CRESTOR) 40 MG tablet TAKE 1 TABLET BY MOUTH  DAILY 06/17/18   Sharion Balloon, FNP     Allergies:     Allergies  Allergen Reactions   Tape Rash     Physical Exam:   Vitals  Blood pressure (!) 149/34, pulse 67, temperature 97.6 F (36.4 C), temperature source Oral, resp. rate 20, height 5\' 2"  (1.575 m), weight 94.3 kg, SpO2 95 %.  1.  General: axoxo3  2. Psychiatric: euthymic  3. Neurologic: cn2-12 intact, reflexes 2+ symmetric, diffuse with no clonus, motor 5/5 in all 4 ext  4. HEENMT:  Anicteric, pupils 1.50mm symmetric, direct, consensual, near intact Neck: no jvd  5. Respiratory : CTAB  6. Cardiovascular : rrr s1, s2,   7. Gastrointestinal:  Abd: soft, nt, nd, +bs  8. Skin:  Ext: no c/c/e,  No rash  9.Musculoskeletal:  Good ROM    Data Review:    CBC Recent Labs  Lab 12/07/18 1224 12/12/18 2202  WBC 10.4 12.4*  HGB 15.0 14.4  HCT 45.6 45.9  PLT 297 277  MCV 92 95.2  MCH 30.3 29.9  MCHC 32.9 31.4  RDW 14.3 14.7  LYMPHSABS 3.0 2.8  MONOABS  --  1.0  EOSABS 0.2 0.2  BASOSABS 0.0 0.1   ------------------------------------------------------------------------------------------------------------------  Results for orders placed  or performed during the hospital encounter of 12/12/18 (from the past 48 hour(s))  CBC with Differential     Status: Abnormal   Collection Time: 12/12/18 10:02 PM  Result Value Ref Range   WBC 12.4 (H) 4.0 - 10.5 K/uL   RBC 4.82 3.87 - 5.11 MIL/uL   Hemoglobin 14.4 12.0 - 15.0 g/dL   HCT 45.9 36.0 - 46.0 %   MCV 95.2 80.0 - 100.0 fL   MCH 29.9 26.0 - 34.0 pg   MCHC 31.4 30.0 - 36.0 g/dL   RDW 14.7 11.5 - 15.5 %   Platelets 277 150 - 400 K/uL   nRBC 0.0 0.0 - 0.2 %   Neutrophils Relative % 66 %   Neutro Abs 8.2 (H) 1.7 - 7.7 K/uL   Lymphocytes Relative 23 %   Lymphs Abs 2.8 0.7 - 4.0 K/uL   Monocytes Relative 8 %   Monocytes Absolute 1.0 0.1 - 1.0 K/uL   Eosinophils Relative 2 %   Eosinophils Absolute 0.2 0.0 - 0.5 K/uL   Basophils Relative 0 %   Basophils Absolute 0.1 0.0 - 0.1 K/uL   Immature Granulocytes 1 %   Abs Immature Granulocytes 0.07 0.00 - 0.07 K/uL    Comment: Performed at [facility], [address] [place], [place] [id number]  Basic metabolic panel     Status: Abnormal   Collection Time: 12/12/18 10:02 PM  Result Value Ref Range   Sodium 137 135 - 145 mmol/L   Potassium 4.0 3.5 - 5.1 mmol/L   Chloride 98 98 - 111 mmol/L   CO2 30 22 - 32 mmol/L   Glucose, Bld 143 (H) 70 - 99 mg/dL   BUN 14 8 - 23 mg/dL   Creatinine, Ser 0.69 0.44 - 1.00 mg/dL   Calcium 9.5 8.9 - 10.3 mg/dL   GFR calc non Af Amer >60 >60 mL/min   GFR calc Af Amer >60 >60 mL/min   Anion gap 9 5 - 15    Comment: Performed at [facility], [address], [place], [place] [address]  Troponin I (High Sensitivity)     Status: Abnormal   Collection Time: 12/12/18 10:02 PM  Result Value Ref Range   Troponin I (High Sensitivity) 42 (H) <18 ng/L    Comment: (NOTE) Elevated high sensitivity troponin I (hsTnI) values and significant  changes across serial measurements may suggest ACS but many other  chronic and acute conditions are known to elevate hsTnI results.  Refer to the "Links" section for  chest pain algorithms and additional  guidance. Performed at [facility], [address], [place], [place] [address]   Troponin I (High Sensitivity)     Status: Abnormal   Collection Time: 12/12/18 11:54 PM  Result Value Ref Range   Troponin I (High Sensitivity) 60 (H) <18 ng/L    Comment: (NOTE) Elevated high sensitivity troponin I (hsTnI) values and significant  changes across serial measurements may suggest ACS but many other  chronic and acute  conditions are known to elevate hsTnI results.  Refer to the "Links" section for chest pain algorithms and additional  guidance. Performed at [facility], [address] [place], [place] [address]   CBG monitoring, ED     Status: Abnormal   Collection Time: 12/13/18  1:15 AM  Result Value Ref Range   Glucose-Capillary 189 (H) 70 - 99 mg/dL    Chemistries  Recent Labs  Lab 12/07/18 1224 12/12/18 2202  NA 141 137  K 4.3 4.0  CL 95* 98  CO2 30* 30  GLUCOSE 159* 143*  BUN 6* 14  CREATININE 0.73 0.69  CALCIUM 9.8 9.5  AST 21  --   ALT 21  --   ALKPHOS 57  --   BILITOT 0.3  --    ------------------------------------------------------------------------------------------------------------------  ------------------------------------------------------------------------------------------------------------------ GFR: Estimated Creatinine Clearance: 73 mL/min (by C-G formula based on SCr of 0.69 mg/dL). Liver Function Tests: Recent Labs  Lab 12/07/18 1224  AST 21  ALT 21  ALKPHOS 57  BILITOT 0.3  PROT 7.6  ALBUMIN 4.2   No results for input(s): LIPASE, AMYLASE in the last 168 hours. No results for input(s): AMMONIA in the last 168 hours. Coagulation Profile: No results for input(s): INR, PROTIME in the last 168 hours. Cardiac Enzymes: No results for input(s): CKTOTAL, CKMB, CKMBINDEX, TROPONINI in the last 168 hours. BNP (last 3 results) No results for input(s): PROBNP in the last 8760 hours. HbA1C: No results for  input(s): HGBA1C in the last 72 hours. CBG: Recent Labs  Lab 12/13/18 0115  GLUCAP 189*   Lipid Profile: No results for input(s): CHOL, HDL, LDLCALC, TRIG, CHOLHDL, LDLDIRECT in the last 72 hours. Thyroid Function Tests: No results for input(s): TSH, T4TOTAL, FREET4, T3FREE, THYROIDAB in the last 72 hours. Anemia Panel: No results for input(s): VITAMINB12, FOLATE, FERRITIN, TIBC, IRON, RETICCTPCT in the last 72 hours.  --------------------------------------------------------------------------------------------------------------- Urine analysis:    Component Value Date/Time   COLORURINE YELLOW 10/27/2013 1341   APPEARANCEUR Clear 04/24/2016 1156   LABSPEC 1.009 10/27/2013 1341   PHURINE 6.5 10/27/2013 1341   GLUCOSEU Negative 04/24/2016 1156   HGBUR NEGATIVE 10/27/2013 1341   BILIRUBINUR Negative 04/24/2016 1156   KETONESUR NEGATIVE 10/27/2013 1341   PROTEINUR Negative 04/24/2016 1156   PROTEINUR NEGATIVE 10/27/2013 1341   UROBILINOGEN 0.2 10/27/2013 1341   NITRITE Negative 04/24/2016 1156   NITRITE NEGATIVE 10/27/2013 1341   LEUKOCYTESUR Negative 04/24/2016 1156      Imaging Results:    Ct Head Wo Contrast  Result Date: 12/12/2018 CLINICAL DATA:  Headache, normal neuro exam EXAM: CT HEAD WITHOUT CONTRAST TECHNIQUE: Contiguous axial images were obtained from the base of the skull through the vertex without intravenous contrast. COMPARISON:  CT 03/01/2015 FINDINGS: Brain: No evidence of acute infarction, hemorrhage, hydrocephalus, extra-axial collection or mass lesion/mass effect. Patchy areas of white matter hypoattenuation are most compatible with chronic microvascular angiopathy. Vascular: Atherosclerotic calcification of the carotid siphons and intradural vertebral arteries. No hyperdense vessel. Skull: No calvarial fracture or suspicious osseous lesion. No scalp swelling or hematoma. Sinuses/Orbits: Paranasal sinuses and mastoid air cells are predominantly clear. Right lens  extraction. Included portions of the orbits are otherwise unremarkable. Other: None IMPRESSION: No acute intracranial abnormality. Mild chronic microvascular angiopathy changes. Electronically Signed   By: Lovena Le M.D.   On: 12/12/2018 22:48    Ekg nsr at 60, nl axis, q in v1-3, st depression in v5,6     Assessment & Plan:    Principal Problem:  Hypertensive urgency Active Problems:   Hypertension associated with diabetes ([place])   Diabetes mellitus with peripheral vascular disease ([place])   Hyperlipidemia associated with type 2 diabetes mellitus ([place])   CAD (coronary artery disease)   Headache  Hypertensive urgency w headache Pt started in nicardipine but bp normalized and therefore stopped Hydralazine 10mg  iv q6h prn sbp >160 Start Amlodipine 10mg  po qday Cont Metoprolol at 50mg  po bid (original dose) Cont Lisinopril 40mg  po qday  Elevated troponin, CAD w h/o NSTEMI, PVD Tele Trop I  Check cardiac echo Cont aspirin, plavix Cont Lopressor as above Cont Crestor 40mg  po qhs  Dm2 fsbs ac and qhs, ISS Cont Humalog 50 units tid ac meals.       DVT Prophylaxis-   Lovenox - SCDs    AM Labs Ordered, also please review Full Orders  Family Communication: Admission, patients condition and plan of care including tests being ordered have been discussed with the patient  who indicate understanding and agree with the plan and Code Status.  Code Status:  FULL CODE per patient, daughter present at bedside in ED  Admission status: Observation: Based on patients clinical presentation and evaluation of above clinical data, I have made determination that patient meets criteria at this time.  Time spent in minutes : 70 minutes   Jani Gravel M.D on 12/13/2018 at 2:05 AM

## 2018-12-20 ENCOUNTER — Other Ambulatory Visit: Payer: Self-pay

## 2018-12-20 ENCOUNTER — Encounter (HOSPITAL_COMMUNITY): Payer: Self-pay | Admitting: Emergency Medicine

## 2018-12-20 DIAGNOSIS — Z955 Presence of coronary angioplasty implant and graft: Secondary | ICD-10-CM | POA: Diagnosis not present

## 2018-12-20 DIAGNOSIS — I10 Essential (primary) hypertension: Secondary | ICD-10-CM | POA: Diagnosis not present

## 2018-12-20 DIAGNOSIS — Z7982 Long term (current) use of aspirin: Secondary | ICD-10-CM | POA: Insufficient documentation

## 2018-12-20 DIAGNOSIS — Z794 Long term (current) use of insulin: Secondary | ICD-10-CM | POA: Diagnosis not present

## 2018-12-20 DIAGNOSIS — Z79899 Other long term (current) drug therapy: Secondary | ICD-10-CM | POA: Insufficient documentation

## 2018-12-20 DIAGNOSIS — E119 Type 2 diabetes mellitus without complications: Secondary | ICD-10-CM | POA: Diagnosis not present

## 2018-12-20 DIAGNOSIS — I110 Hypertensive heart disease with heart failure: Secondary | ICD-10-CM | POA: Insufficient documentation

## 2018-12-20 DIAGNOSIS — Z743 Need for continuous supervision: Secondary | ICD-10-CM | POA: Diagnosis not present

## 2018-12-20 DIAGNOSIS — R0689 Other abnormalities of breathing: Secondary | ICD-10-CM | POA: Diagnosis not present

## 2018-12-20 DIAGNOSIS — Z87891 Personal history of nicotine dependence: Secondary | ICD-10-CM | POA: Insufficient documentation

## 2018-12-20 DIAGNOSIS — Z7902 Long term (current) use of antithrombotics/antiplatelets: Secondary | ICD-10-CM | POA: Diagnosis not present

## 2018-12-20 DIAGNOSIS — I5032 Chronic diastolic (congestive) heart failure: Secondary | ICD-10-CM | POA: Insufficient documentation

## 2018-12-20 DIAGNOSIS — I259 Chronic ischemic heart disease, unspecified: Secondary | ICD-10-CM | POA: Diagnosis not present

## 2018-12-20 MED ORDER — ACETAMINOPHEN 500 MG PO TABS
1000.0000 mg | ORAL_TABLET | Freq: Once | ORAL | Status: AC
  Administered 2018-12-21: 01:00:00 1000 mg via ORAL
  Filled 2018-12-20: qty 2

## 2018-12-20 MED ORDER — AMLODIPINE BESYLATE 5 MG PO TABS
10.0000 mg | ORAL_TABLET | Freq: Once | ORAL | Status: DC
  Filled 2018-12-20: qty 2

## 2018-12-20 NOTE — ED Triage Notes (Signed)
Pt c/o high blood pressures at home and neck pain. Blood pressures at home 228/63.

## 2018-12-21 ENCOUNTER — Emergency Department (HOSPITAL_COMMUNITY)
Admission: EM | Admit: 2018-12-21 | Discharge: 2018-12-21 | Disposition: A | Discharge status: Home / Self Care | Payer: Medicare Other | Attending: Emergency Medicine | Admitting: Emergency Medicine

## 2018-12-21 ENCOUNTER — Encounter: Payer: Self-pay | Admitting: Family

## 2018-12-21 ENCOUNTER — Ambulatory Visit (INDEPENDENT_AMBULATORY_CARE_PROVIDER_SITE_OTHER): Payer: Medicare Other | Admitting: Family

## 2018-12-21 ENCOUNTER — Telehealth: Payer: Self-pay | Admitting: Cardiovascular Disease

## 2018-12-21 DIAGNOSIS — I2510 Atherosclerotic heart disease of native coronary artery without angina pectoris: Secondary | ICD-10-CM | POA: Diagnosis not present

## 2018-12-21 DIAGNOSIS — I152 Hypertension secondary to endocrine disorders: Secondary | ICD-10-CM

## 2018-12-21 DIAGNOSIS — I10 Essential (primary) hypertension: Secondary | ICD-10-CM

## 2018-12-21 DIAGNOSIS — Z09 Encounter for follow-up examination after completed treatment for conditions other than malignant neoplasm: Secondary | ICD-10-CM

## 2018-12-21 DIAGNOSIS — I110 Hypertensive heart disease with heart failure: Secondary | ICD-10-CM | POA: Diagnosis not present

## 2018-12-21 DIAGNOSIS — E1159 Type 2 diabetes mellitus with other circulatory complications: Secondary | ICD-10-CM | POA: Diagnosis not present

## 2018-12-21 DIAGNOSIS — I160 Hypertensive urgency: Secondary | ICD-10-CM

## 2018-12-21 MED ORDER — HYDRALAZINE HCL 25 MG PO TABS
25.0000 mg | ORAL_TABLET | Freq: Once | ORAL | Status: AC
  Administered 2018-12-21: 25 mg via ORAL
  Filled 2018-12-21: qty 1

## 2018-12-21 NOTE — Discharge Instructions (Addendum)
?????????  hydralazine ?????????????????????????????????????? 200 ???????????????????????? 2 ??????? ????????????????????????????????????????????????????????????????????????????????????????????????????????? ?????????????????????????????????????????????????????????????????????????????????????????????????????????????? ? ??????????????????????????????????????????????????????? Pord ch?? y? hydralazine khr??ng h?n??ng h??k khw?m d?n loh?it k?hxng khu? m?kk?? 200 th?? d??n bn p?n wel? n?n k?? 2 ch??wmong b?nth?k s?ng n?? d?wy khw?m d?n loh?it k?hxng khu? phe???x h??? phthy? h?l?k k?hxng khu? s??m?rt?h tidt?m la th?k?r pel??ynplng d?? t?m khw?m c?p?n me???x d? k?t?m th?? khu? m? cudx?xn fok?s? pwd ???rs??a x??ng runrng c?b h?n??xk x??ng runrng h??yc? t?h?? s??? s?e?y k?r mxng h??n h?r??x x?k?r x???n th?? ke??ywk??xng k?b khw?m d?n loh?it s??ng pord kl?b p? th?? p?hnk c?hukc?hein   Please take half of the hydralazine if your blood pressure is more than 200 on the top for longer than 2 hours. Log this with your blood pressures so your primary doctor can track it and make changes as necessary. Any time you have focal weakness, severe headache, severe chest pain, shortness of breath, loss of vision or other concerning symptoms with your elevated blood pressure, please return to the emergency department.

## 2018-12-21 NOTE — Telephone Encounter (Signed)
Left message to call back  

## 2018-12-21 NOTE — ED Provider Notes (Signed)
[facility] EMERGENCY DEPARTMENT Provider Note   CSN: [id number] Arrival date & time: 12/20/18  2216     History   Chief Complaint Chief Complaint  Patient presents with  . Hypertension    HPI Leah Ferrell is a 68 y.o. female.      Hypertension This is a chronic problem. The current episode started more than 1 week ago. The problem occurs constantly. The problem has been gradually worsening. Pertinent negatives include no chest pain, no abdominal pain, no headaches and no shortness of breath. Nothing aggravates the symptoms. Nothing relieves the symptoms. She has tried nothing for the symptoms. The treatment provided no relief.    Past Medical History:  Diagnosis Date  . CAD (coronary artery disease)    a. 04/2012 NSTEMI: s/p DES to LAD.  Marland Kitchen Chronic diastolic CHF (congestive heart failure) ([place])   . Diabetes mellitus without complication ([place])   . Dysrhythmia   . Environmental and seasonal allergies   . Hyperlipidemia   . Hypertension   . Leukocytosis   . Morbid obesity ([place])   . NSTEMI (non-ST elevated myocardial infarction) ([place]) 04/27/2012  . PONV (postoperative nausea and vomiting)   . PVD (peripheral vascular disease) ([place])    a. 04/2012 ABI: R 0.49, L 0.39. Angiography 06/14: Significant ostial left common iliac artery stenosis extending into the distal aorta, severe left common femoral artery stenosis, bilateral SFA occlusion with heavy calcifications. Functionally, one-vessel runoff bilaterally below the knee  . Shortness of breath     Patient Active Problem List   Diagnosis Date Noted  . Hypertensive urgency 12/13/2018  . Headache 12/13/2018  . Osteopenia 04/27/2018  . Hypoglycemia 12/02/2017  . Acute on chronic diastolic CHF (congestive heart failure) ([place]) 12/02/2017  . Acute on chronic diastolic congestive heart failure ([place]) 12/01/2017  . Dizziness 12/01/2017  . Morbid obesity ([place]) 06/10/2017  . PMB (postmenopausal bleeding) 11/12/2015  .  Hypokalemia 11/23/2014  . Leg pain, left 07/06/2014  . Vaginal atrophy 05/08/2014  . Left leg swelling 01/17/2014  . PAD (peripheral artery disease) ([place]) 10/18/2013  . Abnormal stress test 07/18/2013  . PVD (peripheral vascular disease) ([place]) 07/12/2013  . S/P primary angioplasty with coronary stent 07/12/2013  . Diabetes 1.5, managed as type 2 ([place]) 07/12/2013  . Chronic diastolic heart failure ([place]) 08/27/2012  . Carotid bruit 08/27/2012  . Angina pectoris ([place]) 04/30/2012  . CAD (coronary artery disease) 04/28/2012  . Hx of non-ST elevation myocardial infarction (NSTEMI) 04/23/2012  . Hypertension associated with diabetes ([place])   . Diabetes mellitus with peripheral vascular disease ([place])   . Hyperlipidemia associated with type 2 diabetes mellitus [facility])     Past Surgical History:  Procedure Laterality Date  . ABDOMINAL AORTAGRAM N/A 07/21/2012   Procedure: ABDOMINAL Maxcine Ham;  Surgeon: Wellington Hampshire, MD;  Location: [place] CATH LAB;  Service: Cardiovascular;  Laterality: N/A;  . CARDIAC CATHETERIZATION     stents X 2  . CORONARY ANGIOGRAM  04/27/2012   Procedure: CORONARY ANGIOGRAM;  Surgeon: Thayer Headings, MD;  Location: [facility] CATH LAB;  Service: Cardiovascular;;  . ENDARTERECTOMY FEMORAL Left 11/02/2013   Procedure: RESECTION LEFT COMMON FEMORAL ARTERY AND INSERTION OF INTERPOSITION 48mm HEMASHIELD GRAFT FROM LEFT EXTERNAL  ILIAC ARTERY TO LEFT PROFUNDA  FEMORIS ARTERY;  Surgeon: Mal Misty, MD;  Location: [place];  Service: Vascular;  Laterality: Left;  . EYE SURGERY Right 2017  . FRACTURE SURGERY Right Jul 07, 2014   Right Foot-  Pt.fell  at [facility]  by Heat duct  . ILIAC ARTERY STENT Left 08/31/2013  . LAD stent    . LEFT HEART CATHETERIZATION WITH CORONARY ANGIOGRAM N/A 04/23/2012   Procedure: LEFT HEART CATHETERIZATION WITH CORONARY ANGIOGRAM;  Surgeon: Peter M Martinique, MD;  Location: [facility] CATH LAB;  Service: Cardiovascular;  Laterality: N/A;  . LOWER EXTREMITY ANGIOGRAM Left  08/31/2013   Procedure: LOWER EXTREMITY ANGIOGRAM;  Surgeon: Wellington Hampshire, MD;  Location: [place] CATH LAB;  Service: Cardiovascular;  Laterality: Left;  . PERCUTANEOUS STENT INTERVENTION Left 08/31/2013   Procedure: PERCUTANEOUS STENT INTERVENTION;  Surgeon: Wellington Hampshire, MD;  Location: [place] CATH LAB;  Service: Cardiovascular;  Laterality: Left;  Common Iliac artery  . PERIPHERAL VASCULAR CATHETERIZATION N/A 03/19/2016   Procedure: Abdominal Aortogram w/Lower Extremity;  Surgeon: Wellington Hampshire, MD;  Location: [place] CV LAB;  Service: Cardiovascular;  Laterality: N/A;  . PERIPHERAL VASCULAR CATHETERIZATION  03/19/2016   Procedure: Peripheral Vascular Balloon Angioplasty-CFA Left;  Surgeon: Wellington Hampshire, MD;  Location: [place] CV LAB;  Service: Cardiovascular;;  . 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 Authorizing Provider  acetaminophen (TYLENOL) 325 MG tablet Take 2 tablets (650 mg total) by mouth every 6 (six) hours as needed for mild pain (or Fever >/= 101). 12/13/18   Roxan Hockey, MD  albuterol (PROVENTIL HFA) 108 (90 Base) MCG/ACT inhaler INHALE ONE TO TWO PUFFS BY  MOUTH EVERY 6 HOURS AS  NEEDED FOR WHEEZING OR FOR  SHORTNESS OF BREATH Patient taking differently: Inhale 1-2 puffs into the lungs every 6 (six) hours as needed for wheezing or shortness of breath.  03/02/17   Evelina Dun A, FNP  amLODipine (NORVASC) 10 MG tablet Take 1 tablet (10 mg total) by mouth daily. 12/13/18   Roxan Hockey, MD  Ascorbic Acid (VITAMIN C) 1000 MG tablet Take 1,000 mg by mouth 3 (three) times daily.     [provider]  aspirin EC 81 MG tablet Take 81 mg by mouth every morning.     [provider]  BD ULTRA-FINE PEN NEEDLES 29G X 12.7MM MISC USE PENNEEDLES 3 TIMES  DAILY AS DIRECTED 06/17/18   Evelina Dun A, FNP  Calcium Carbonate-Vit D-Min (CALTRATE 600+D PLUS MINERALS) 600-800 MG-UNIT TABS Take 1 tablet by mouth 3 (three) times daily.     [provider]  clopidogrel (PLAVIX) 75 MG tablet Take 1 tablet (75 mg total) by mouth daily. 12/13/18   Roxan Hockey, MD  fish oil-omega-3 fatty acids 1000 MG capsule Take 1 g by mouth every morning.    [provider]  furosemide (LASIX) 40 MG tablet TAKE 1 TABLET BY MOUTH  DAILY MAY TAKE ADDITIONAL  TABLET AS NEEDED FOR  SWELLING Patient taking differently: Take 40 mg by mouth 2 (two) times daily as needed. TAKE 1 TABLET BY MOUTH  DAILY MAY TAKE ADDITIONAL  TABLET AS NEEDED FOR  SWELLING 06/17/18   Hawks, Christy A, FNP  HUMALOG KWIKPEN 200 UNIT/ML SOPN INJECT SUBCUTANEOUSLY 50  UNITS 3 TIMES DAILY BEFORE  MEALS Patient taking differently: Inject 50 Units into the skin 3 (three) times daily.  12/08/18   Evelina Dun A, FNP  hydrALAZINE (APRESOLINE) 50 MG tablet Take 1 tablet (50 mg total) by mouth 3 (three) times daily. 12/13/18   Roxan Hockey, MD  hydroxypropyl methylcellulose (ISOPTO TEARS) 2.5 % ophthalmic solution Place 1 drop into both eyes 3 (three) times daily as needed (for dry eyes).    [provider]  isosorbide mononitrate (IMDUR) 30 MG 24 hr tablet Take 1 tablet (30 mg total) by mouth daily. 12/14/18   Roxan Hockey, MD  lisinopril (ZESTRIL) 20 MG tablet Take 2 tablets (40 mg total) by mouth daily. 12/13/18   Roxan Hockey, MD  metoprolol tartrate (LOPRESSOR) 50 MG tablet Take 1 tablet (50 mg total) by mouth 2 (two) times daily. 12/13/18   Emokpae, Courage, MD  montelukast (SINGULAIR) 10 MG tablet TAKE 1 TABLET BY MOUTH  DAILY WITH BREAKFAST Patient taking differently: Take 10 mg by mouth daily.  06/17/18   Sharion Balloon, FNP  mupirocin ointment (BACTROBAN) 2 % Apply 1 application  topically 2 (two) times daily. 12/07/18   Sharion Balloon, FNP  nitroGLYCERIN (NITROSTAT) 0.4 MG SL tablet Place 1 tablet (0.4 mg total) under the tongue every 5 (five) minutes x 3 doses as needed for chest pain (up to 3 doses). 03/10/16   Evelina Dun A, FNP  potassium chloride SA (K-DUR) 20 MEQ tablet Take 20 meq daily, take extra 10 meq if you take extra lasix 09/27/18   Hawks, [place] A, FNP  RELION PEN NEEDLES 29G X 12MM MISC USE 1 NEEDLE TO INJECT INSULIN SUBCUTANEOUSLY 3 TIMES DAILY Patient taking differently: 1 Device by Subconjunctival route 3 (three) times daily.  01/31/16   Sharion Balloon, FNP  rosuvastatin (CRESTOR) 40 MG tablet Take 1 tablet (40 mg total) by mouth daily. 12/13/18   Roxan Hockey, MD  VITAMIN D PO Take 1 capsule by mouth daily.    [provider]    Family History Family History  Problem Relation Age of Onset  . Diabetes Mother   . Hyperlipidemia Mother   . Hypertension Mother   . Peripheral vascular disease Mother   . Diabetes Maternal Grandmother   . Heart attack Maternal Grandfather   . Heart disease Brother   . Other Sister        drowned  . Diabetes Brother   . Alcohol abuse Brother   . Other Sister        drowned  . Diabetes Daughter        borderline  . Hypertension Daughter   . Diabetes Son   . Hypertension Son   . Hyperlipidemia Son     Social History Social History   Tobacco Use  . Smoking status: Former Smoker    Packs/day: 1.00    Years: 36.00    Pack years: 36.00    Types: Cigarettes    Start date: 04/21/1964    Quit date: 02/17/2001    Years since quitting: 17.8  . Smokeless tobacco: Never Used  Substance Use Topics  . Alcohol use: No    Alcohol/week: 0.0 standard drinks  . Drug use: No     Allergies   Tape   Review of Systems Review of Systems  Respiratory: Negative for shortness of breath.   Cardiovascular: Negative for chest  pain.  Gastrointestinal: Negative for abdominal pain.  Neurological:  Negative for headaches.  All other systems reviewed and are negative.    Physical Exam Updated Vital Signs BP (!) 167/58   Pulse 68   Temp 98.4 F (36.9 C)   Resp 17   Wt 95 kg   SpO2 97%   BMI 38.31 kg/m   Physical Exam Vitals signs and nursing note reviewed.  Constitutional:      Appearance: She is well-developed.  HENT:     Head: Normocephalic and atraumatic.  Eyes:     Extraocular Movements: Extraocular movements intact.     Conjunctiva/sclera: Conjunctivae normal.  Neck:     Musculoskeletal: Normal range of motion.  Cardiovascular:     Rate and Rhythm: Normal rate and regular rhythm.  Pulmonary:     Effort: No respiratory distress.     Breath sounds: No stridor.  Abdominal:     General: Abdomen is flat. There is no distension.  Musculoskeletal: Normal range of motion.        General: No swelling.  Skin:    General: Skin is warm and dry.  Neurological:     General: No focal deficit present.     Mental Status: She is alert.      ED Treatments / Results  Labs (all labs ordered are listed, but only abnormal results are displayed) Labs Reviewed - No data to display  EKG EKG Interpretation  Date/Time:  Tuesday December 21 2018 01:29:32 EST Ventricular Rate:  72 PR Interval:    QRS Duration: 87 QT Interval:  422 QTC Calculation: 462 R Axis:   12 Text Interpretation: Sinus rhythm Nonspecific T abnormalities, lateral leads Baseline wander in lead(s) V6 No significant change since last tracing Confirmed by Merrily Pew [phone]) on 12/21/2018 1:34:17 AM   Radiology No results found.  Procedures Procedures (including critical care time)  Medications Ordered in ED Medications  acetaminophen (TYLENOL) tablet 1,000 mg (1,000 mg Oral Given 12/21/18 0058)  hydrALAZINE (APRESOLINE) tablet 25 mg (25 mg Oral Given 12/21/18 0058)     Initial Impression / Assessment and Plan / ED Course  I have reviewed the triage vital signs and the nursing notes.   Pertinent labs & imaging results that were available during my care of the patient were reviewed by me and considered in my medical decision making (see chart for details).        Here with HTN but at this time asymptomatic and no e/o end organ damage or other indication for acute lowering. Suspect some of it is because of anxiety about the blood pressures themselves, but also doesn't seem to be following low salt diet and many of her elevated pressures are in the evening. No e/o end organ damage, but will advise her to take an extra half dose of hydralazine prn for >2 hours of [id number] systolic and keep a log of it for PCP management.  Final Clinical Impressions(s) / ED Diagnoses   Final diagnoses:  Hypertension, unspecified type    ED Discharge Orders    None       Rhyland Hinderliter, Corene Cornea, MD 12/21/18 [phone]

## 2018-12-21 NOTE — Progress Notes (Signed)
   Virtual Visit via telephone Note Due to COVID-19 pandemic this visit was conducted virtually. This visit type was conducted due to national recommendations for restrictions regarding the COVID-19 Pandemic (e.g. social distancing, sheltering in place) in an effort to limit this patient's exposure and mitigate transmission in our community. All issues noted in this document were discussed and addressed.  A physical exam was not performed with this format.  I connected with Leah Ferrell on 12/21/18 at 3:15 pm  by telephone and verified that I am speaking with the correct person using two identifiers. Leah Ferrell is currently located at home and no one is currently with her during visit. The provider, Evelina Dun, FNP is located in their office at time of visit.  I discussed the limitations, risks, security and privacy concerns of performing an evaluation and management service by telephone and the availability of in person appointments. I also discussed with the patient that there may be a patient responsible charge related to this service. The patient expressed understanding and agreed to proceed.   History and Present Illness:  Pt calls the office today for follow up on HTN and hospital follow up. Pt is currently  amlodipine 10 mg daily, lisinopril 20 mg daily, metoprolol 50 mg twice daily, hydralazine 50 mg 3 times daily and Imdur 30 mg daily. Hypertension This is a chronic problem. The current episode started more than 1 year ago. The problem has been waxing and waning since onset. The problem is uncontrolled. Associated symptoms include malaise/fatigue. Pertinent negatives include no headaches or shortness of breath.      Review of Systems  Constitutional: Positive for malaise/fatigue.  Respiratory: Negative for shortness of breath.   Neurological: Negative for headaches.     Observations/Objective:  No SOB or distress noted, she reports her BP is 186/47  Assessment and  Plan: 1. Hypertension associated with diabetes ([place]) - CBC with Differential/Platelet; Future - BMP8+EGFR; Future - Ambulatory referral to Nephrology  2. Hypertensive urgency - CBC with Differential/Platelet; Future - BMP8+EGFR; Future - Ambulatory referral to Nephrology  3. Morbid obesity ([place]) - CBC with Differential/Platelet; Future - BMP8+EGFR; Future - Ambulatory referral to Nephrology  4. Hospital discharge follow-up - CBC with Differential/Platelet; Future - BMP8+EGFR; Future - Ambulatory referral to Nephrology  Continue all medications Referral to Nephrologists placed Labs pending Strict low salt diet Keep chronic follow up   I discussed the assessment and treatment plan with the patient. The patient was provided an opportunity to ask questions and all were answered. The patient agreed with the plan and demonstrated an understanding of the instructions.   The patient was advised to call back or seek an in-person evaluation if the symptoms worsen or if the condition fails to improve as anticipated.  The above assessment and management plan was discussed with the patient. The patient verbalized understanding of and has agreed to the management plan. Patient is aware to call the clinic if symptoms persist or worsen. Patient is aware when to return to the clinic for a follow-up visit. Patient educated on when it is appropriate to go to the emergency department.   Time call ended:  3:40 pm   I provided 25 minutes of non-face-to-face time during this encounter.    Evelina Dun, FNP

## 2018-12-21 NOTE — Telephone Encounter (Signed)
Pt c/o BP issue: STAT if pt c/o blurred vision, one-sided weakness or slurred speech  1. What are your last 5 BP readings? 11/03: 207/61 HR65  2. Are you having any other symptoms (ex. Dizziness, headache, blurred vision, passed out)? no  3. What is your BP issue? Patient just got out of hospital and states her BP keeps going up. Doctor expressed that she needed to get in touch with Dr. Fletcher [name].

## 2018-12-23 ENCOUNTER — Other Ambulatory Visit: Payer: Medicare Other

## 2018-12-23 ENCOUNTER — Other Ambulatory Visit: Payer: Self-pay

## 2018-12-23 DIAGNOSIS — I160 Hypertensive urgency: Secondary | ICD-10-CM

## 2018-12-23 DIAGNOSIS — I10 Essential (primary) hypertension: Secondary | ICD-10-CM | POA: Diagnosis not present

## 2018-12-23 DIAGNOSIS — Z09 Encounter for follow-up examination after completed treatment for conditions other than malignant neoplasm: Secondary | ICD-10-CM

## 2018-12-23 DIAGNOSIS — I152 Hypertension secondary to endocrine disorders: Secondary | ICD-10-CM

## 2018-12-23 DIAGNOSIS — E1159 Type 2 diabetes mellitus with other circulatory complications: Secondary | ICD-10-CM

## 2018-12-23 NOTE — Telephone Encounter (Signed)
She needs to be seen by an APP to address this.  She usually sees Dr. Harl Bowie for the cardiac issues.

## 2018-12-23 NOTE — Telephone Encounter (Signed)
Returned the call to the patient. She has refused to come in any earlier to see an APP. She would rather wait until 11/17 to see Dr. Fletcher [name].   She stated that she has seen her PCP and got lab work done. Her blood pressure medications have been adjusted accordingly.  She has been advised to call back if anything is needed prior to her appointment.

## 2018-12-23 NOTE — Telephone Encounter (Signed)
Called pt about BP concerns. Pt stated that she was in the hospital on the 25th for HTN and they kept her overnight and sent her home on a BP medication. She stated that she was going to the doctor's office to get lab work done this morning. Asked pt what her current BP was, stated she "had to go to the doctor's office because she needs to eat before she gets sick". Advised pt to keep checking her BP and to continue taking her meds and to call if it sustains over 180s. Also advised to call 911 if she experiences any severe headache, blurred vision, one-sided weakness, slurred speech or chest tightness. Advised to keep appt with Dr Fletcher [name] on 11/17. Verbalized understanding.

## 2018-12-24 ENCOUNTER — Telehealth: Payer: Self-pay | Admitting: *Deleted

## 2018-12-24 LAB — CBC WITH DIFFERENTIAL/PLATELET
Basophils Absolute: 0.1 10*3/uL (ref 0.0–0.2)
Basos: 1 %
EOS (ABSOLUTE): 0.1 10*3/uL (ref 0.0–0.4)
Eos: 1 %
Hematocrit: 43.8 % (ref 34.0–46.6)
Hemoglobin: 14.2 g/dL (ref 11.1–15.9)
Immature Grans (Abs): 0 10*3/uL (ref 0.0–0.1)
Immature Granulocytes: 0 %
Lymphocytes Absolute: 2.9 10*3/uL (ref 0.7–3.1)
Lymphs: 25 %
MCH: 29.8 pg (ref 26.6–33.0)
MCHC: 32.4 g/dL (ref 31.5–35.7)
MCV: 92 fL (ref 79–97)
Monocytes Absolute: 0.7 10*3/uL (ref 0.1–0.9)
Monocytes: 6 %
Neutrophils Absolute: 8 10*3/uL — ABNORMAL HIGH (ref 1.4–7.0)
Neutrophils: 67 %
Platelets: 290 10*3/uL (ref 150–450)
RBC: 4.76 x10E6/uL (ref 3.77–5.28)
RDW: 13.4 % (ref 11.7–15.4)
WBC: 11.8 10*3/uL — ABNORMAL HIGH (ref 3.4–10.8)

## 2018-12-24 LAB — BMP8+EGFR
BUN/Creatinine Ratio: 24 (ref 12–28)
BUN: 21 mg/dL (ref 8–27)
CO2: 23 mmol/L (ref 20–29)
Calcium: 10.2 mg/dL (ref 8.7–10.3)
Chloride: 92 mmol/L — ABNORMAL LOW (ref 96–106)
Creatinine, Ser: 0.88 mg/dL (ref 0.57–1.00)
GFR calc Af Amer: 79 mL/min/{1.73_m2} (ref >59)
GFR calc non Af Amer: 68 mL/min/{1.73_m2} (ref >59)
Glucose: 251 mg/dL — ABNORMAL HIGH (ref 65–99)
Potassium: 5.2 mmol/L (ref 3.5–5.2)
Sodium: 136 mmol/L (ref 134–144)

## 2018-12-24 NOTE — Telephone Encounter (Signed)
Leah Hampshire, MD  Ricci Barker, RN        Fine with me .   Previous Messages  ----- Message -----  From: Arnoldo Lenis, MD  Sent: 12/23/2018  3:15 PM EST  To: Leah Hampshire, MD, Ricci Barker, RN  Subject: RE: Transfer of care               Yes that is fine   Leah Abts MD  ----- Message -----  From: Ricci Barker, RN  Sent: 12/23/2018  2:03 PM EST  To: Leah Hampshire, MD, Arnoldo Lenis, MD  Subject: Transfer of care                 The patient would like to transfer all of her care, cardiology and PV, to Dr. Fletcher [name]. Is this okay?   Thank you,  Leah Ferrell       Patient would like to transfer care to Dr. Fletcher [name]

## 2019-01-01 ENCOUNTER — Other Ambulatory Visit: Payer: Self-pay | Admitting: Family

## 2019-01-01 DIAGNOSIS — E1159 Type 2 diabetes mellitus with other circulatory complications: Secondary | ICD-10-CM

## 2019-01-01 DIAGNOSIS — I152 Hypertension secondary to endocrine disorders: Secondary | ICD-10-CM

## 2019-01-04 ENCOUNTER — Ambulatory Visit (INDEPENDENT_AMBULATORY_CARE_PROVIDER_SITE_OTHER): Payer: Medicare Other | Admitting: Cardiovascular Disease

## 2019-01-04 ENCOUNTER — Other Ambulatory Visit: Payer: Self-pay

## 2019-01-04 VITALS — BP 190/57 | HR 63 | Temp 98.4°F | Ht 62.0 in | Wt 209.0 lb

## 2019-01-04 DIAGNOSIS — I5032 Chronic diastolic (congestive) heart failure: Secondary | ICD-10-CM

## 2019-01-04 DIAGNOSIS — I2510 Atherosclerotic heart disease of native coronary artery without angina pectoris: Secondary | ICD-10-CM | POA: Diagnosis not present

## 2019-01-04 DIAGNOSIS — I739 Peripheral vascular disease, unspecified: Secondary | ICD-10-CM

## 2019-01-04 DIAGNOSIS — I10 Essential (primary) hypertension: Secondary | ICD-10-CM

## 2019-01-04 DIAGNOSIS — E785 Hyperlipidemia, unspecified: Secondary | ICD-10-CM | POA: Diagnosis not present

## 2019-01-04 MED ORDER — SPIRONOLACTONE 25 MG PO TABS: 25.0000 mg | ORAL_TABLET | Freq: Every day | ORAL | 3 refills | Status: DC

## 2019-01-04 MED ORDER — CARVEDILOL 12.5 MG PO TABS: 12.5000 mg | ORAL_TABLET | Freq: Two times a day (BID) | ORAL | 3 refills | Status: DC

## 2019-01-04 NOTE — Progress Notes (Signed)
Cardiology Office Note   Date:  01/04/2019   ID:  Leah Ferrell, DOB 26-Sep-1950, MRN [id number]  PCP:  Sharion Balloon, FNP  Cardiologist: Dr. Fletcher [name] (patient decided to switch all her cardiac care)  No chief complaint on file.     History of Present Illness: Leah Ferrell is a 68 y.o. female who presents for a followup visit regarding peripheral arterial disease and coronary artery disease.  She has known history of coronary artery disease status post drug-eluting stent placement to the LAD X 2 Most recently in 2015 for in-stent restenosis.  She is known to have peripheral arterial disease status post left common iliac artery stent placement followed by left common femoral artery resection and bypass from left external iliac to the left profunda done by Dr. Kellie Simmering in 2015. She is known to have chronically occluded left SFA being managed medically.  She had worsening left leg claudication in 2018 with elevated velocity at the proximal anastomosis of the graft.   Angiography in March 2018 showed mild to moderate calcified common iliac artery disease with patent stent in the left common iliac artery. There was severe stenosis in the left common femoral artery at the proximal anastomosis of the bypass to the profunda. I performed successful angioplasty and drug-coated balloon angioplasty.  She was hospitalized in October [id number] for diastolic heart failure.  Echocardiogram showed an EF of 65 to 70% with grade 2 diastolic dysfunction.  Carotid Doppler showed moderate left carotid stenosis.  Lexiscan Myoview in November 2019 showed small distal anterior scar without ischemia with normal ejection fraction. She has been dealing with uncontrolled hypertension over the last month.  She went to the emergency room in October with elevated blood pressure after she ran out of amlodipine.  Blood pressure was 199/61.  Troponin was mildly elevated thought to be due to supply demand ischemia.  She was  briefly hospitalized and started on nicardipine drip.  Blood pressure improved with resumption of amlodipine.  Echocardiogram showed normal LV systolic function with no wall motion abnormalities.  She went to the emergency room again in November 3 with elevated blood pressure at 167/58.  I reviewed her recent labs which showed normal renal function and electrolytes.  Her blood pressure continues to be elevated in spite of the addition of Imdur.  She denies excessive salt intake.  No chest pain or worsening dyspnea.  She has stable leg claudication.  Past Medical History:  Diagnosis Date  . CAD (coronary artery disease)    a. 04/2012 NSTEMI: s/p DES to LAD.  Marland Kitchen Chronic diastolic CHF (congestive heart failure) ([place])   . Diabetes mellitus without complication ([place])   . Dysrhythmia   . Environmental and seasonal allergies   . Hyperlipidemia   . Hypertension   . Leukocytosis   . Morbid obesity ([place])   . NSTEMI (non-ST elevated myocardial infarction) ([place]) 04/27/2012  . PONV (postoperative nausea and vomiting)   . PVD (peripheral vascular disease) ([place])    a. 04/2012 ABI: R 0.49, L 0.39. Angiography 06/14: Significant ostial left common iliac artery stenosis extending into the distal aorta, severe left common femoral artery stenosis, bilateral SFA occlusion with heavy calcifications. Functionally, one-vessel runoff bilaterally below the knee  . Shortness of breath     Past Surgical History:  Procedure Laterality Date  . ABDOMINAL AORTAGRAM N/A 07/21/2012   Procedure: ABDOMINAL Maxcine Ham;  Surgeon: Wellington Hampshire, MD;  Location: [facility] CATH LAB;  Service: Cardiovascular;  Laterality: N/A;  .  CARDIAC CATHETERIZATION     stents X 2  . CORONARY ANGIOGRAM  04/27/2012   Procedure: CORONARY ANGIOGRAM;  Surgeon: Thayer Headings, MD;  Location: [facility] CATH LAB;  Service: Cardiovascular;;  . ENDARTERECTOMY FEMORAL Left 11/02/2013   Procedure: RESECTION LEFT COMMON FEMORAL ARTERY AND INSERTION OF INTERPOSITION 8mm  HEMASHIELD GRAFT FROM LEFT EXTERNAL  ILIAC ARTERY TO LEFT PROFUNDA  FEMORIS ARTERY;  Surgeon: Mal Misty, MD;  Location: [facility];  Service: Vascular;  Laterality: Left;  . EYE SURGERY Right 2017  . FRACTURE SURGERY Right Jul 07, 2014   Right Foot-  [place]  [place] Home  by Heat duct  . ILIAC ARTERY STENT Left 08/31/2013  . LAD stent    . LEFT HEART CATHETERIZATION WITH CORONARY ANGIOGRAM N/A 04/23/2012   Procedure: LEFT HEART CATHETERIZATION WITH CORONARY ANGIOGRAM;  Surgeon: Peter M Martinique, MD;  Location: [facility] CATH LAB;  Service: Cardiovascular;  Laterality: N/A;  . LOWER EXTREMITY ANGIOGRAM Left 08/31/2013   Procedure: LOWER EXTREMITY ANGIOGRAM;  Surgeon: Wellington Hampshire, MD;  Location: [facility] CATH LAB;  Service: Cardiovascular;  Laterality: Left;  . PERCUTANEOUS STENT INTERVENTION Left 08/31/2013   Procedure: PERCUTANEOUS STENT INTERVENTION;  Surgeon: Wellington Hampshire, MD;  Location: [facility] CATH LAB;  Service: Cardiovascular;  Laterality: Left;  Common Iliac artery  . PERIPHERAL VASCULAR CATHETERIZATION N/A 03/19/2016   Procedure: Abdominal Aortogram w/Lower Extremity;  Surgeon: Wellington Hampshire, MD;  Location: [facility] CV LAB;  Service: Cardiovascular;  Laterality: N/A;  . PERIPHERAL VASCULAR CATHETERIZATION  03/19/2016   Procedure: Peripheral Vascular Balloon Angioplasty-CFA Left;  Surgeon: Wellington Hampshire, MD;  Location: [facility] CV LAB;  Service: Cardiovascular;;  . 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 acetaminophen (TYLENOL) 325 MG tablet Take 2 tablets (650 mg total) by mouth every 6 (six) hours as needed for mild pain (or Fever >/= 101). 20 tablet 0  . albuterol (PROVENTIL HFA) 108 (90 Base) MCG/ACT inhaler INHALE ONE TO TWO PUFFS BY  MOUTH EVERY 6 HOURS AS  NEEDED FOR WHEEZING OR FOR  SHORTNESS OF BREATH (Patient taking differently: Inhale 1-2 puffs into the lungs every 6 (six) hours as needed for wheezing or  shortness of breath. ) 13.4 g 2  . amLODipine (NORVASC) 10 MG tablet Take 1 tablet (10 mg total) by mouth daily. 90 tablet 3  . Ascorbic Acid (VITAMIN C) 1000 MG tablet Take 1,000 mg by mouth 3 (three) times daily.     Marland Kitchen aspirin EC 81 MG tablet Take 81 mg by mouth every morning.     . BD ULTRA-FINE PEN NEEDLES 29G X 12.7MM MISC USE PENNEEDLES 3 TIMES  DAILY AS DIRECTED 270 each 1  . Calcium Carbonate-Vit D-Min (CALTRATE 600+D PLUS MINERALS) 600-800 MG-UNIT TABS Take 1 tablet by mouth 3 (three) times daily.     . clopidogrel (PLAVIX) 75 MG tablet Take 1 tablet (75 mg total) by mouth daily. 90 tablet 3  . fish oil-omega-3 fatty acids 1000 MG capsule Take 1 g by mouth every morning.    . furosemide (LASIX) 40 MG tablet TAKE 1 TABLET BY MOUTH  DAILY MAY TAKE ADDITIONAL  TABLET AS NEEDED FOR  SWELLING (Patient taking differently: Take 40 mg by mouth 2 (two) times daily as needed. TAKE 1 TABLET BY MOUTH  DAILY MAY TAKE ADDITIONAL  TABLET AS NEEDED FOR  SWELLING) 180 tablet 1  . HUMALOG [facility]  200 UNIT/ML SOPN INJECT SUBCUTANEOUSLY 50  UNITS 3 TIMES DAILY BEFORE  MEALS (Patient taking differently: Inject 50 Units into the skin 3 (three) times daily. ) 24 mL 11  . hydrALAZINE (APRESOLINE) 50 MG tablet Take 1 tablet (50 mg total) by mouth 3 (three) times daily. 90 tablet 5  . hydroxypropyl methylcellulose (ISOPTO TEARS) 2.5 % ophthalmic solution Place 1 drop into both eyes 3 (three) times daily as needed (for dry eyes).    Marland Kitchen lisinopril (ZESTRIL) 20 MG tablet Take 2 tablets (40 mg total) by mouth daily. 180 tablet 1  . montelukast (SINGULAIR) 10 MG tablet TAKE 1 TABLET BY MOUTH  DAILY WITH BREAKFAST (Patient taking differently: Take 10 mg by mouth daily. ) 90 tablet 1  . mupirocin ointment (BACTROBAN) 2 % Apply 1 application topically 2 (two) times daily. 22 g 0  . nitroGLYCERIN (NITROSTAT) 0.4 MG SL tablet Place 1 tablet (0.4 mg total) under the tongue every 5 (five) minutes x 3 doses as needed for chest pain  (up to 3 doses). 25 tablet 4  . RELION PEN NEEDLES 29G X 12MM MISC USE 1 NEEDLE TO INJECT INSULIN SUBCUTANEOUSLY 3 TIMES DAILY (Patient taking differently: 1 Device by Subconjunctival route 3 (three) times daily. ) 50 each 5  . rosuvastatin (CRESTOR) 40 MG tablet Take 1 tablet (40 mg total) by mouth daily. 90 tablet 1  . VITAMIN D PO Take 1 capsule by mouth daily.    . carvedilol (COREG) 12.5 MG tablet Take 1 tablet (12.5 mg total) by mouth 2 (two) times daily. 180 tablet 3  . spironolactone (ALDACTONE) 25 MG tablet Take 1 tablet (25 mg total) by mouth daily. 90 tablet 3   No current facility-administered medications for this visit.     Allergies:   Tape    Social History:  The patient  reports that she quit smoking about 17 years ago. Her smoking use included cigarettes. She started smoking about 54 years ago. She has a 36.00 pack-year smoking history. She has never used smokeless tobacco. She reports that she does not drink alcohol or use drugs.   Family History:  The patient's family history includes Alcohol abuse in her brother; Diabetes in her brother, daughter, maternal grandmother, mother, and son; Heart attack in her maternal grandfather; Heart disease in her brother; Hyperlipidemia in her mother and son; Hypertension in her daughter, mother, and son; Other in her sister and sister; Peripheral vascular disease in her mother.    ROS:  Please see the history of present illness.   Otherwise, review of systems are positive for none.   All other systems are reviewed and negative.    PHYSICAL EXAM: VS:  BP (!) 190/57   Pulse 63   Temp 98.4 F (36.9 C)   Ht 5\' 2"  (1.575 m)   Wt 209 lb (94.8 kg)   SpO2 98%   BMI 38.23 kg/m  , BMI Body mass index is 38.23 kg/m. GEN: Well nourished, well developed, in no acute distress  HEENT: normal  Neck: no JVD, carotid bruits, or masses Cardiac: RRR; no  rubs, or gallops , trace edema . There is 2/6 systolic ejection murmur in the aortic area  Respiratory:  clear to auscultation bilaterally, normal work of breathing GI: soft, nontender, nondistended, + BS MS: no deformity or atrophy  Skin: warm and dry, no rash Neuro:  Strength and sensation are intact Psych: euthymic mood, full affect Vascular: Femoral pulses : +1 on the right and not palpable  on the left with a surgical scar.    EKG:  EKG is  Not ordered today.    Recent Labs: 12/07/2018: TSH 1.470 12/13/2018: ALT 19 12/23/2018: BUN 21; Creatinine, Ser 0.88; Hemoglobin 14.2; Platelets 290; Potassium 5.2; Sodium 136    Lipid Panel    Component Value Date/Time   CHOL 141 09/02/2018 1157   TRIG 86 09/02/2018 1157   TRIG 135 09/16/2012 1322   HDL 66 09/02/2018 1157   HDL 60 09/16/2012 1322   CHOLHDL 2.1 09/02/2018 1157   CHOLHDL 3.2 05/06/2012 1713   VLDL 32 05/06/2012 1713   LDLCALC 58 09/02/2018 1157   LDLCALC 92 09/16/2012 1322      Wt Readings from Last 3 Encounters:  01/04/19 209 lb (94.8 kg)  12/20/18 209 lb 7 oz (95 kg)  12/13/18 209 lb 12.8 oz (95.2 kg)       No flowsheet data found.    ASSESSMENT AND PLAN:  1.  Peripheral arterial disease: Stable bilateral leg claudication.  Most recent vascular studies in September showed stable ABI of 0.5 range.  2. Coronary artery disease involving native coronary arteries without angina: Lexiscan Myoview in November 2019 showed small apical infarct with only mild peri-infarct ischemia and normal ejection fraction.  Continue medical therapy.  3. Essential hypertension: The patient had significant problems recently with elevated blood pressure.  She denies excessive sodium intake.  I do think we have to exclude renal artery stenosis and thus I requested renal artery duplex. We have to simplify her medications.  I elected to switch metoprolol to carvedilol 12.5 mg twice daily.  Isosorbide has not been effective and it is a weak antihypertensive medication anyway.  I elected to stop this and add spironolactone 25  mg once daily.  Discontinue potassium and check basic metabolic profile in 1 week.  4. Hyperlipidemia: The dose of rosuvastatin was increased to 40 mg once daily.  Most recent lipid profile showed an LDL of 63.  5.  Chronic diastolic heart failure: She appears to be euvolemic on current dose of furosemide   Disposition:   FU with me in 1  month  Signed,  [name], MD  01/04/2019 2:49 PM    [place]

## 2019-01-04 NOTE — Patient Instructions (Signed)
Medication Instructions:  Stop taking Isosorbide. Stop taking Metoprolol. Stop taking Potassium Chloride.  Start taking 12.5mg  Carvedilol Twice Daily. Start taking 25mg  Spironolactone Daily.  If you need a refill on your cardiac medications before your next appointment, please call your pharmacy.   Lab work: BMET in 1 week If you have labs (blood work) drawn today and your tests are completely normal, you will receive your results only by: [place] (if you have MyChart) OR A paper copy in the mail If you have any lab test that is abnormal or we need to change your treatment, we will call you to review the results.  Testing/Procedures: Your physician has requested that you have a renal artery duplex at the [facility] office. During this test, an ultrasound is used to evaluate blood flow to the kidneys. Allow one hour for this exam. Do not eat after midnight the day before and avoid carbonated beverages. Take your medications as you usually do.   Follow-Up: At [facility], you and your health needs are our priority.  As part of our continuing mission to provide you with exceptional heart care, we have created designated Provider Care Teams.  These Care Teams include your primary Cardiologist (physician) and Advanced Practice Providers (APPs -  Physician Assistants and Nurse Practitioners) who all work together to provide you with the care you need, when you need it. You may see Dr Fletcher [name] or one of the following Advanced Practice Providers on your designated Care Team:    Kerin Ransom, PA-C  [place], [place]  Coletta Memos, [place]  Your physician wants you to follow-up in: 1 month.

## 2019-01-05 ENCOUNTER — Other Ambulatory Visit: Payer: Self-pay | Admitting: Cardiovascular Disease

## 2019-01-05 DIAGNOSIS — I10 Essential (primary) hypertension: Secondary | ICD-10-CM

## 2019-01-11 ENCOUNTER — Other Ambulatory Visit: Payer: Self-pay | Admitting: Cardiology

## 2019-01-15 ENCOUNTER — Other Ambulatory Visit: Payer: Self-pay | Admitting: Cardiology

## 2019-01-17 ENCOUNTER — Other Ambulatory Visit: Payer: Self-pay | Admitting: Cardiology

## 2019-01-18 ENCOUNTER — Other Ambulatory Visit: Payer: Self-pay | Admitting: *Deleted

## 2019-01-18 MED ORDER — HYDRALAZINE HCL 25 MG PO TABS: 25.0000 mg | ORAL_TABLET | Freq: Three times a day (TID) | ORAL | 1 refills | Status: DC

## 2019-01-18 MED ORDER — SPIRONOLACTONE 25 MG PO TABS: 25.0000 mg | ORAL_TABLET | Freq: Every day | ORAL | 3 refills | Status: DC

## 2019-01-22 ENCOUNTER — Other Ambulatory Visit: Payer: Self-pay | Admitting: Family

## 2019-01-22 DIAGNOSIS — E1169 Type 2 diabetes mellitus with other specified complication: Secondary | ICD-10-CM

## 2019-01-22 DIAGNOSIS — E785 Hyperlipidemia, unspecified: Secondary | ICD-10-CM

## 2019-01-22 DIAGNOSIS — I739 Peripheral vascular disease, unspecified: Secondary | ICD-10-CM

## 2019-01-22 DIAGNOSIS — I5031 Acute diastolic (congestive) heart failure: Secondary | ICD-10-CM

## 2019-01-22 DIAGNOSIS — I2510 Atherosclerotic heart disease of native coronary artery without angina pectoris: Secondary | ICD-10-CM

## 2019-01-22 DIAGNOSIS — E1151 Type 2 diabetes mellitus with diabetic peripheral angiopathy without gangrene: Secondary | ICD-10-CM

## 2019-02-16 ENCOUNTER — Ambulatory Visit (INDEPENDENT_AMBULATORY_CARE_PROVIDER_SITE_OTHER): Payer: Medicare Other

## 2019-02-16 ENCOUNTER — Other Ambulatory Visit: Payer: Self-pay

## 2019-02-16 ENCOUNTER — Other Ambulatory Visit: Payer: Medicare Other

## 2019-02-16 DIAGNOSIS — I10 Essential (primary) hypertension: Secondary | ICD-10-CM | POA: Diagnosis not present

## 2019-02-17 LAB — BASIC METABOLIC PANEL
BUN/Creatinine Ratio: 17 (ref 12–28)
BUN: 16 mg/dL (ref 8–27)
CO2: 29 mmol/L (ref 20–29)
Calcium: 10.1 mg/dL (ref 8.7–10.3)
Chloride: 95 mmol/L — ABNORMAL LOW (ref 96–106)
Creatinine, Ser: 0.96 mg/dL (ref 0.57–1.00)
GFR calc Af Amer: 71 mL/min/{1.73_m2} (ref >59)
GFR calc non Af Amer: 61 mL/min/{1.73_m2} (ref >59)
Glucose: 189 mg/dL — ABNORMAL HIGH (ref 65–99)
Potassium: 4.8 mmol/L (ref 3.5–5.2)
Sodium: 138 mmol/L (ref 134–144)

## 2019-02-21 ENCOUNTER — Other Ambulatory Visit: Payer: Self-pay | Admitting: Family

## 2019-02-21 DIAGNOSIS — E876 Hypokalemia: Secondary | ICD-10-CM

## 2019-02-22 ENCOUNTER — Encounter: Payer: Self-pay | Admitting: Cardiovascular Disease

## 2019-02-22 ENCOUNTER — Ambulatory Visit (INDEPENDENT_AMBULATORY_CARE_PROVIDER_SITE_OTHER): Payer: Medicare Other | Admitting: Cardiovascular Disease

## 2019-02-22 ENCOUNTER — Other Ambulatory Visit: Payer: Self-pay

## 2019-02-22 ENCOUNTER — Telehealth: Payer: Self-pay | Admitting: Cardiovascular Disease

## 2019-02-22 VITALS — BP 178/65 | HR 68 | Temp 97.9°F | Ht 62.0 in | Wt 208.2 lb

## 2019-02-22 DIAGNOSIS — I150 Renovascular hypertension: Secondary | ICD-10-CM

## 2019-02-22 DIAGNOSIS — E785 Hyperlipidemia, unspecified: Secondary | ICD-10-CM | POA: Diagnosis not present

## 2019-02-22 DIAGNOSIS — I739 Peripheral vascular disease, unspecified: Secondary | ICD-10-CM

## 2019-02-22 DIAGNOSIS — I2510 Atherosclerotic heart disease of native coronary artery without angina pectoris: Secondary | ICD-10-CM

## 2019-02-22 MED ORDER — HYDRALAZINE HCL 25 MG PO TABS: 25.0000 mg | ORAL_TABLET | Freq: Three times a day (TID) | ORAL | 1 refills | Status: DC

## 2019-02-22 NOTE — Patient Instructions (Signed)
Medication Instructions:  COMPARE YOUR MEDICATION LIST FROM TODAY WITH WHAT YOU HAVE AT HOME IF YOU ARE NOT TAKING HYDRALAZINE THREE TIMES A DAY YOU HAVE A PRESCRIPTION AT [facility]  IF YOU ARE CALL THE OFFICE  CALL THE OFFICE IF ANYTHING ELSE ON LIST DOES NOT MATCH WHAT AND HOW  YOU ARE TAKING   *If you need a refill on your cardiac medications before your next appointment, please call your pharmacy*  Lab Work: NONE   Testing/Procedures: NONE  Follow-Up: At [facility], you and your health needs are our priority.  As part of our continuing mission to provide you with exceptional heart care, we have created designated Provider Care Teams.  These Care Teams include your primary Cardiologist (physician) and Advanced Practice Providers (APPs -  Physician Assistants and Nurse Practitioners) who all work together to provide you with the care you need, when you need it.  Your next appointment:   3 month(s)  The format for your next appointment:   In Person  Provider:   DR Fletcher [name]

## 2019-02-22 NOTE — H&P (View-Only) (Signed)
Cardiology Office Note   Date:  02/22/2019   ID:  Leah Ferrell, DOB 01/21/1951, MRN [id number]  PCP:  Sharion Balloon, FNP  Cardiologist: Dr. Fletcher [name] (patient decided to switch all her cardiac care)  No chief complaint on file.     History of Present Illness: Leah Ferrell is a 69 y.o. female who presents for a followup visit regarding peripheral arterial disease and coronary artery disease.  She has known history of coronary artery disease status post drug-eluting stent placement to the LAD X 2 Most recently in 2015 for in-stent restenosis.  She is known to have peripheral arterial disease status post left common iliac artery stent placement followed by left common femoral artery resection and bypass from left external iliac to the left profunda done by Dr. Kellie Simmering in 2015. She is known to have chronically occluded left SFA being managed medically.  She had worsening left leg claudication in 2018 with elevated velocity at the proximal anastomosis of the graft.   Angiography in March 2018 showed mild to moderate calcified common iliac artery disease with patent stent in the left common iliac artery. There was severe stenosis in the left common femoral artery at the proximal anastomosis of the bypass to the profunda. I performed successful angioplasty and drug-coated balloon angioplasty.  She was hospitalized in October [id number] for diastolic heart failure.  Echocardiogram showed an EF of 65 to 70% with grade 2 diastolic dysfunction.  Carotid Doppler showed moderate left carotid stenosis.  Lexiscan Myoview in November 2019 showed small distal anterior scar without ischemia with normal ejection fraction. She has been dealing with uncontrolled hypertension over the last month.  She went to the emergency room in October with elevated blood pressure after she ran out of amlodipine.  Blood pressure was 199/61.  Troponin was mildly elevated thought to be due to supply demand ischemia.  She was  briefly hospitalized and started on nicardipine drip.  Blood pressure improved with resumption of amlodipine.  Echocardiogram showed normal LV systolic function with no wall motion abnormalities.  She went to the emergency room again in November 3 with elevated blood pressure at 167/58.  I reviewed her recent labs which showed normal renal function and electrolytes. During last visit, she was noted to have a systolic blood pressure of 190. I switch metoprolol to carvedilol, stopped isosorbide and added spironolactone 25 mg once daily. I referred her for renal artery duplex which showed significant bilateral renal artery stenosis. Blood pressure improved but is still not controlled. She has not been taking hydralazine as prescribed.   Past Medical History:  Diagnosis Date  . CAD (coronary artery disease)    a. 04/2012 NSTEMI: s/p DES to LAD.  Marland Kitchen Chronic diastolic CHF (congestive heart failure) ([place])   . Diabetes mellitus without complication ([place])   . Dysrhythmia   . Environmental and seasonal allergies   . Hyperlipidemia   . Hypertension   . Leukocytosis   . Morbid obesity ([place])   . NSTEMI (non-ST elevated myocardial infarction) ([place]) 04/27/2012  . PONV (postoperative nausea and vomiting)   . PVD (peripheral vascular disease) ([place])    a. 04/2012 ABI: R 0.49, L 0.39. Angiography 06/14: Significant ostial left common iliac artery stenosis extending into the distal aorta, severe left common femoral artery stenosis, bilateral SFA occlusion with heavy calcifications. Functionally, one-vessel runoff bilaterally below the knee  . Shortness of breath     Past Surgical History:  Procedure Laterality Date  . ABDOMINAL AORTAGRAM  N/A 07/21/2012   Procedure: ABDOMINAL Maxcine Ham;  Surgeon: Wellington Hampshire, MD;  Location: [facility] C[facility] LAB;  Service: Cardiovascular;  Laterality: N/A;  . CARDIAC CATHETERIZATION     stents X 2  . CORONARY ANGIOGRAM  04/27/2012   Procedure: CORONARY ANGIOGRAM;  Surgeon: Thayer Headings, MD;  Location: [facility] CATH LAB;  Service: Cardiovascular;;  . ENDARTERECTOMY FEMORAL Left 11/02/2013   Procedure: RESECTION LEFT COMMON FEMORAL ARTERY AND INSERTION OF INTERPOSITION 41mm HEMASHIELD GRAFT FROM LEFT EXTERNAL  ILIAC ARTERY TO LEFT PROFUNDA  FEMORIS ARTERY;  Surgeon: Mal Misty, MD;  Location: [facility];  Service: Vascular;  Laterality: Left;  . EYE SURGERY Right 2017  . FRACTURE SURGERY Right Jul 07, 2014   Right [place]-  [place]  [facility]  by Heat duct  . ILIAC ARTERY STENT Left 08/31/2013  . LAD stent    . LEFT HEART CATHETERIZATION WITH CORONARY ANGIOGRAM N/A 04/23/2012   Procedure: LEFT HEART CATHETERIZATION WITH CORONARY ANGIOGRAM;  Surgeon: Peter M Martinique, MD;  Location: [facility] CATH LAB;  Service: Cardiovascular;  Laterality: N/A;  . LOWER EXTREMITY ANGIOGRAM Left 08/31/2013   Procedure: LOWER EXTREMITY ANGIOGRAM;  Surgeon: Wellington Hampshire, MD;  Location: [place] CATH LAB;  Service: Cardiovascular;  Laterality: Left;  . PERCUTANEOUS STENT INTERVENTION Left 08/31/2013   Procedure: PERCUTANEOUS STENT INTERVENTION;  Surgeon: Wellington Hampshire, MD;  Location: [place] [facility] CATH LAB;  Service: Cardiovascular;  Laterality: Left;  Common Iliac artery  . PERIPHERAL VASCULAR CATHETERIZATION N/A 03/19/2016   Procedure: Abdominal Aortogram w/Lower Extremity;  Surgeon: Wellington Hampshire, MD;  Location: [place] CV LAB;  Service: Cardiovascular;  Laterality: N/A;  . PERIPHERAL VASCULAR CATHETERIZATION  03/19/2016   Procedure: Peripheral Vascular Balloon Angioplasty-CFA Left;  Surgeon: Wellington Hampshire, MD;  Location: [place] CV LAB;  Service: Cardiovascular;;  . 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 acetaminophen (TYLENOL) 325 MG tablet Take 2 tablets (650 mg total) by mouth every 6 (six) hours as needed for mild pain (or Fever >/= 101). 20 tablet 0  . albuterol (PROVENTIL HFA) 108 (90 Base) MCG/ACT inhaler INHALE ONE TO TWO PUFFS  BY  MOUTH EVERY 6 HOURS AS  NEEDED FOR WHEEZING OR FOR  SHORTNESS OF BREATH (Patient taking differently: Inhale 1-2 puffs into the lungs every 6 (six) hours as needed for wheezing or shortness of breath. ) 13.4 g 2  . amLODipine (NORVASC) 10 MG tablet Take 1 tablet (10 mg total) by mouth daily. 90 tablet 3  . Ascorbic Acid (VITAMIN C) 1000 MG tablet Take 1,000 mg by mouth 3 (three) times daily.     Marland Kitchen aspirin EC 81 MG tablet Take 81 mg by mouth every morning.     . BD ULTRA-FINE PEN NEEDLES 29G X 12.7MM MISC USE PENNEEDLES 3 TIMES  DAILY AS DIRECTED 270 each 1  . Calcium Carbonate-Vit D-Min (CALTRATE 600+D PLUS MINERALS) 600-800 MG-UNIT TABS Take 1 tablet by mouth 3 (three) times daily.     . carvedilol (COREG) 12.5 MG tablet Take 1 tablet (12.5 mg total) by mouth 2 (two) times daily. 180 tablet 3  . clopidogrel (PLAVIX) 75 MG tablet TAKE 1 TABLET BY MOUTH  DAILY 90 tablet 0  . fish oil-omega-3 fatty acids 1000 MG capsule Take 1 g by mouth every morning.    . furosemide (LASIX) 40 MG tablet TAKE 1 TABLET BY MOUTH  DAILY MAY TAKE  ADDITIONAL  TABLET AS NEEDED FOR  SWELLING (Patient taking differently: Take 40 mg by mouth 2 (two) times daily as needed. TAKE 1 TABLET BY MOUTH  DAILY MAY TAKE ADDITIONAL  TABLET AS NEEDED FOR  SWELLING) 180 tablet 1  . HUMALOG KWIKPEN 200 UNIT/ML SOPN INJECT SUBCUTANEOUSLY 50  UNITS 3 TIMES DAILY BEFORE  MEALS (Patient taking differently: Inject 50 Units into the skin 3 (three) times daily. ) 24 mL 11  . hydrALAZINE (APRESOLINE) 25 MG tablet Take 1 tablet (25 mg total) by mouth 3 (three) times daily. 270 tablet 1  . hydroxypropyl methylcellulose (ISOPTO TEARS) 2.5 % ophthalmic solution Place 1 drop into both eyes 3 (three) times daily as needed (for dry eyes).    Marland Kitchen lisinopril (ZESTRIL) 20 MG tablet Take 2 tablets (40 mg total) by mouth daily. 180 tablet 1  . montelukast (SINGULAIR) 10 MG tablet TAKE 1 TABLET BY MOUTH  DAILY WITH BREAKFAST 90 tablet 3  . mupirocin ointment  (BACTROBAN) 2 % Apply 1 application topically 2 (two) times daily. 22 g 0  . nitroGLYCERIN (NITROSTAT) 0.4 MG SL tablet Place 1 tablet (0.4 mg total) under the tongue every 5 (five) minutes x 3 doses as needed for chest pain (up to 3 doses). 25 tablet 4  . potassium chloride SA (KLOR-CON) 20 MEQ tablet TAKE 1 TABLET BY MOUTH  DAILY . TAKE EXTRA 10 MEQ  (1/2 TABLET) IF YOU TAKE  EXTRA LASIX 135 tablet 3  . RELION PEN NEEDLES 29G X 12MM MISC USE 1 NEEDLE TO INJECT INSULIN SUBCUTANEOUSLY 3 TIMES DAILY (Patient taking differently: 1 Device by Subconjunctival route 3 (three) times daily. ) 50 each 5  . rosuvastatin (CRESTOR) 40 MG tablet TAKE 1 TABLET BY MOUTH  DAILY 90 tablet 0  . spironolactone (ALDACTONE) 25 MG tablet Take 1 tablet (25 mg total) by mouth daily. 90 tablet 3  . VITAMIN D PO Take 1 capsule by mouth daily.     No current facility-administered medications for this visit.    Allergies:   Tape    Social History:  The patient  reports that she quit smoking about 18 years ago. Her smoking use included cigarettes. She started smoking about 54 years ago. She has a 36.00 pack-year smoking history. She has never used smokeless tobacco. She reports that she does not drink alcohol or use drugs.   Family History:  The patient's family history includes Alcohol abuse in her brother; Diabetes in her brother, daughter, maternal grandmother, mother, and son; Heart attack in her maternal grandfather; Heart disease in her brother; Hyperlipidemia in her mother and son; Hypertension in her daughter, mother, and son; Other in her sister and sister; Peripheral vascular disease in her mother.    ROS:  Please see the history of present illness.   Otherwise, review of systems are positive for none.   All other systems are reviewed and negative.    PHYSICAL EXAM: VS:  BP (!) 178/65   Pulse 68   Temp 97.9 F (36.6 C)   Ht 5\' 2"  (1.575 m)   Wt 208 lb 3.2 oz (94.4 kg)   SpO2 93%   BMI 38.08 kg/m  , BMI  Body mass index is 38.08 kg/m. GEN: Well nourished, well developed, in no acute distress  HEENT: normal  Neck: no JVD, carotid bruits, or masses Cardiac: RRR; no  rubs, or gallops , trace edema . There is 2/6 systolic ejection murmur in the aortic area Respiratory:  clear to auscultation  bilaterally, normal work of breathing GI: soft, nontender, nondistended, + BS MS: no deformity or atrophy  Skin: warm and dry, no rash Neuro:  Strength and sensation are intact Psych: euthymic mood, full affect Vascular: Femoral pulses : +1 on the right and not palpable on the left with a surgical scar.    EKG:  EKG is   ordered today. EKG showed sinus rhythm with possible left atrial enlargement   Recent Labs: 12/07/2018: TSH 1.470 12/13/2018: ALT 19 12/23/2018: Hemoglobin 14.2; Platelets 290 02/16/2019: BUN 16; Creatinine, Ser 0.96; Potassium 4.8; Sodium 138    Lipid Panel    Component Value Date/Time   CHOL 141 09/02/2018 1157   TRIG 86 09/02/2018 1157   TRIG 135 09/16/2012 1322   HDL 66 09/02/2018 1157   HDL 60 09/16/2012 1322   CHOLHDL 2.1 09/02/2018 1157   CHOLHDL 3.2 05/06/2012 1713   VLDL 32 05/06/2012 1713   LDLCALC 58 09/02/2018 1157   LDLCALC 92 09/16/2012 1322      Wt Readings from Last 3 Encounters:  02/22/19 208 lb 3.2 oz (94.4 kg)  01/04/19 209 lb (94.8 kg)  12/20/18 209 lb 7 oz (95 kg)       No flowsheet data found.    ASSESSMENT AND PLAN:  1.  Peripheral arterial disease: Stable bilateral leg claudication.  Most recent vascular studies in September showed stable ABI of 0.5 range.  2. Coronary artery disease involving native coronary arteries without angina: Lexiscan Myoview in November 2019 showed small apical infarct with only mild peri-infarct ischemia and normal ejection fraction.  Continue medical therapy.  3. Renovascular hypertension: The patient's blood pressure continues to be elevated in spite of of 4 different antihypertensive medications. She was  also supposed to be on hydralazine but she has not been taking the medications. I have refilled hydralazine 25 mg 3 times daily. Continue other medications. I suspect that she will require renal artery angiography and revascularization in order to control her blood pressure but will wait few months.  4. Hyperlipidemia: The dose of rosuvastatin was increased to 40 mg once daily.  Most recent lipid profile showed an LDL of 63.  5.  Chronic diastolic heart failure: She appears to be euvolemic on current dose of furosemide   Disposition:   FU with me in 3  months  Signed,  [name], MD  02/22/2019 11:49 AM    [place]

## 2019-02-22 NOTE — Telephone Encounter (Signed)
*  STAT* If patient is at the pharmacy, call can be transferred to refill team.   1. Which medications need to be refilled? (please list name of each medication and dose if known) hydrALAZINE (APRESOLINE) 25 MG tablet  2. Which pharmacy/location (including street and city if local pharmacy) is medication to be sent to? [place], [address]  3. Do they need a 30 day or 90 day supply? [place]

## 2019-02-22 NOTE — Telephone Encounter (Signed)
Refilled as requested  Left message to call back if needs something further

## 2019-02-22 NOTE — Progress Notes (Signed)
Cardiology Office Note   Date:  02/22/2019   ID:  Leah Ferrell, DOB 1950-03-02, MRN [id number]  PCP:  Sharion Balloon, FNP  Cardiologist: Dr. Fletcher [name] (patient decided to switch all her cardiac care)  No chief complaint on file.     History of Present Illness: Leah Ferrell is a 69 y.o. female who presents for a followup visit regarding peripheral arterial disease and coronary artery disease.  She has known history of coronary artery disease status post drug-eluting stent placement to the LAD X 2 Most recently in 2015 for in-stent restenosis.  She is known to have peripheral arterial disease status post left common iliac artery stent placement followed by left common femoral artery resection and bypass from left external iliac to the left profunda done by Dr. Kellie Simmering in 2015. She is known to have chronically occluded left SFA being managed medically.  She had worsening left leg claudication in 2018 with elevated velocity at the proximal anastomosis of the graft.   Angiography in March 2018 showed mild to moderate calcified common iliac artery disease with patent stent in the left common iliac artery. There was severe stenosis in the left common femoral artery at the proximal anastomosis of the bypass to the profunda. I performed successful angioplasty and drug-coated balloon angioplasty.  She was hospitalized in October [id number] for diastolic heart failure.  Echocardiogram showed an EF of 65 to 70% with grade 2 diastolic dysfunction.  Carotid Doppler showed moderate left carotid stenosis.  Lexiscan Myoview in November 2019 showed small distal anterior scar without ischemia with normal ejection fraction. She has been dealing with uncontrolled hypertension over the last month.  She went to the emergency room in October with elevated blood pressure after she ran out of amlodipine.  Blood pressure was 199/61.  Troponin was mildly elevated thought to be due to supply demand ischemia.  She was  briefly hospitalized and started on nicardipine drip.  Blood pressure improved with resumption of amlodipine.  Echocardiogram showed normal LV systolic function with no wall motion abnormalities.  She went to the emergency room again in November 3 with elevated blood pressure at 167/58.  I reviewed her recent labs which showed normal renal function and electrolytes. During last visit, she was noted to have a systolic blood pressure of 190. I switch metoprolol to carvedilol, stopped isosorbide and added spironolactone 25 mg once daily. I referred her for renal artery duplex which showed significant bilateral renal artery stenosis. Blood pressure improved but is still not controlled. She has not been taking hydralazine as prescribed.   Past Medical History:  Diagnosis Date  . CAD (coronary artery disease)    a. 04/2012 NSTEMI: s/p DES to LAD.  Marland Kitchen Chronic diastolic CHF (congestive heart failure) ([place])   . Diabetes mellitus without complication ([place])   . Dysrhythmia   . Environmental and seasonal allergies   . Hyperlipidemia   . Hypertension   . Leukocytosis   . Morbid obesity ([place])   . NSTEMI (non-ST elevated myocardial infarction) ([place]) 04/27/2012  . PONV (postoperative nausea and vomiting)   . PVD (peripheral vascular disease) ([place])    a. 04/2012 ABI: R 0.49, L 0.39. Angiography 06/14: Significant ostial left common iliac artery stenosis extending into the distal aorta, severe left common femoral artery stenosis, bilateral SFA occlusion with heavy calcifications. Functionally, one-vessel runoff bilaterally below the knee  . Shortness of breath     Past Surgical History:  Procedure Laterality Date  . ABDOMINAL AORTAGRAM  N/A 07/21/2012   Procedure: ABDOMINAL Maxcine Ham;  Surgeon: Wellington Hampshire, MD;  Location: [facility] CATH LAB;  Service: Cardiovascular;  Laterality: N/A;  . CARDIAC CATHETERIZATION     stents X 2  . CORONARY ANGIOGRAM  04/27/2012   Procedure: CORONARY ANGIOGRAM;  Surgeon: Thayer Headings, MD;  Location: [facility] CATH LAB;  Service: Cardiovascular;;  . ENDARTERECTOMY FEMORAL Left 11/02/2013   Procedure: RESECTION LEFT COMMON FEMORAL ARTERY AND INSERTION OF INTERPOSITION 66mm HEMASHIELD GRAFT FROM LEFT EXTERNAL  ILIAC ARTERY TO LEFT PROFUNDA  FEMORIS ARTERY;  Surgeon: Mal Misty, MD;  Location: [facility];  Service: Vascular;  Laterality: Left;  . EYE SURGERY Right 2017  . FRACTURE SURGERY Right Jul 07, 2014   Right Foot-  Pt.[place]  at Home  by Heat duct  . ILIAC ARTERY STENT Left 08/31/2013  . LAD stent    . LEFT HEART CATHETERIZATION WITH CORONARY ANGIOGRAM N/A 04/23/2012   Procedure: LEFT HEART CATHETERIZATION WITH CORONARY ANGIOGRAM;  Surgeon: Peter M Martinique, MD;  Location: [facility] C[facility] LAB;  Service: Cardiovascular;  Laterality: N/A;  . LOWER EXTREMITY ANGIOGRAM Left 08/31/2013   Procedure: LOWER EXTREMITY ANGIOGRAM;  Surgeon: Wellington Hampshire, MD;  Location: [facility] CATH LAB;  Service: Cardiovascular;  Laterality: Left;  . PERCUTANEOUS STENT INTERVENTION Left 08/31/2013   Procedure: PERCUTANEOUS STENT INTERVENTION;  Surgeon: Wellington Hampshire, MD;  Location: [facility] CATH LAB;  Service: Cardiovascular;  Laterality: Left;  Common Iliac artery  . PERIPHERAL VASCULAR CATHETERIZATION N/A 03/19/2016   Procedure: Abdominal Aortogram w/Lower Extremity;  Surgeon: Wellington Hampshire, MD;  Location: [facility] CV LAB;  Service: Cardiovascular;  Laterality: N/A;  . PERIPHERAL VASCULAR CATHETERIZATION  03/19/2016   Procedure: Peripheral Vascular Balloon Angioplasty-CFA Left;  Surgeon: Wellington Hampshire, MD;  Location: [facility] CV LAB;  Service: Cardiovascular;;  . 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 acetaminophen (TYLENOL) 325 MG tablet Take 2 tablets (650 mg total) by mouth every 6 (six) hours as needed for mild pain (or Fever >/= 101). 20 tablet 0  . albuterol (PROVENTIL HFA) 108 (90 Base) MCG/ACT inhaler INHALE ONE TO TWO PUFFS  BY  MOUTH EVERY 6 HOURS AS  NEEDED FOR WHEEZING OR FOR  SHORTNESS OF BREATH (Patient taking differently: Inhale 1-2 puffs into the lungs every 6 (six) hours as needed for wheezing or shortness of breath. ) 13.4 g 2  . amLODipine (NORVASC) 10 MG tablet Take 1 tablet (10 mg total) by mouth daily. 90 tablet 3  . Ascorbic Acid (VITAMIN C) 1000 MG tablet Take 1,000 mg by mouth 3 (three) times daily.     Marland Kitchen aspirin EC 81 MG tablet Take 81 mg by mouth every morning.     . BD ULTRA-FINE PEN NEEDLES 29G X 12.7MM MISC USE PENNEEDLES 3 TIMES  DAILY AS DIRECTED 270 each 1  . Calcium Carbonate-Vit D-Min (CALTRATE 600+D PLUS MINERALS) 600-800 MG-UNIT TABS Take 1 tablet by mouth 3 (three) times daily.     . carvedilol (COREG) 12.5 MG tablet Take 1 tablet (12.5 mg total) by mouth 2 (two) times daily. 180 tablet 3  . clopidogrel (PLAVIX) 75 MG tablet TAKE 1 TABLET BY MOUTH  DAILY 90 tablet 0  . fish oil-omega-3 fatty acids 1000 MG capsule Take 1 g by mouth every morning.    . furosemide (LASIX) 40 MG tablet TAKE 1 TABLET BY MOUTH  DAILY MAY TAKE  ADDITIONAL  TABLET AS NEEDED FOR  SWELLING (Patient taking differently: Take 40 mg by mouth 2 (two) times daily as needed. TAKE 1 TABLET BY MOUTH  DAILY MAY TAKE ADDITIONAL  TABLET AS NEEDED FOR  SWELLING) 180 tablet 1  . HUMALOG KWIKPEN 200 UNIT/ML SOPN INJECT SUBCUTANEOUSLY 50  UNITS 3 TIMES DAILY BEFORE  MEALS (Patient taking differently: Inject 50 Units into the skin 3 (three) times daily. ) 24 mL 11  . hydrALAZINE (APRESOLINE) 25 MG tablet Take 1 tablet (25 mg total) by mouth 3 (three) times daily. 270 tablet 1  . hydroxypropyl methylcellulose (ISOPTO TEARS) 2.5 % ophthalmic solution Place 1 drop into both eyes 3 (three) times daily as needed (for dry eyes).    Marland Kitchen lisinopril (ZESTRIL) 20 MG tablet Take 2 tablets (40 mg total) by mouth daily. 180 tablet 1  . montelukast (SINGULAIR) 10 MG tablet TAKE 1 TABLET BY MOUTH  DAILY WITH BREAKFAST 90 tablet 3  . mupirocin ointment  (BACTROBAN) 2 % Apply 1 application topically 2 (two) times daily. 22 g 0  . nitroGLYCERIN (NITROSTAT) 0.4 MG SL tablet Place 1 tablet (0.4 mg total) under the tongue every 5 (five) minutes x 3 doses as needed for chest pain (up to 3 doses). 25 tablet 4  . potassium chloride SA (KLOR-CON) 20 MEQ tablet TAKE 1 TABLET BY MOUTH  DAILY . TAKE EXTRA 10 MEQ  (1/2 TABLET) IF YOU TAKE  EXTRA LASIX 135 tablet 3  . RELION PEN NEEDLES 29G X 12MM MISC USE 1 NEEDLE TO INJECT INSULIN SUBCUTANEOUSLY 3 TIMES DAILY (Patient taking differently: 1 Device by Subconjunctival route 3 (three) times daily. ) 50 each 5  . rosuvastatin (CRESTOR) 40 MG tablet TAKE 1 TABLET BY MOUTH  DAILY 90 tablet 0  . spironolactone (ALDACTONE) 25 MG tablet Take 1 tablet (25 mg total) by mouth daily. 90 tablet 3  . VITAMIN D PO Take 1 capsule by mouth daily.     No current facility-administered medications for this visit.    Allergies:   Tape    Social History:  The patient  reports that she quit smoking about 18 years ago. Her smoking use included cigarettes. She started smoking about 54 years ago. She has a 36.00 pack-year smoking history. She has never used smokeless tobacco. She reports that she does not drink alcohol or use drugs.   Family History:  The patient's family history includes Alcohol abuse in her brother; Diabetes in her brother, daughter, maternal grandmother, mother, and son; Heart attack in her maternal grandfather; Heart disease in her brother; Hyperlipidemia in her mother and son; Hypertension in her daughter, mother, and son; Other in her sister and sister; Peripheral vascular disease in her mother.    ROS:  Please see the history of present illness.   Otherwise, review of systems are positive for none.   All other systems are reviewed and negative.    PHYSICAL EXAM: VS:  BP (!) 178/65   Pulse 68   Temp 97.9 F (36.6 C)   Ht 5\' 2"  (1.575 m)   Wt 208 lb 3.2 oz (94.4 kg)   SpO2 93%   BMI 38.08 kg/m  , BMI  Body mass index is 38.08 kg/m. GEN: Well nourished, well developed, in no acute distress  HEENT: normal  Neck: no JVD, carotid bruits, or masses Cardiac: RRR; no  rubs, or gallops , trace edema . There is 2/6 systolic ejection murmur in the aortic area Respiratory:  clear to auscultation  bilaterally, normal work of breathing GI: soft, nontender, nondistended, + BS MS: no deformity or atrophy  Skin: warm and dry, no rash Neuro:  Strength and sensation are intact Psych: euthymic mood, full affect Vascular: Femoral pulses : +1 on the right and not palpable on the left with a surgical scar.    EKG:  EKG is   ordered today. EKG showed sinus rhythm with possible left atrial enlargement   Recent Labs: 12/07/2018: TSH 1.470 12/13/2018: ALT 19 12/23/2018: Hemoglobin 14.2; Platelets 290 02/16/2019: BUN 16; Creatinine, Ser 0.96; Potassium 4.8; Sodium 138    Lipid Panel    Component Value Date/Time   CHOL 141 09/02/2018 1157   TRIG 86 09/02/2018 1157   TRIG 135 09/16/2012 1322   HDL 66 09/02/2018 1157   HDL 60 09/16/2012 1322   CHOLHDL 2.1 09/02/2018 1157   CHOLHDL 3.2 05/06/2012 1713   VLDL 32 05/06/2012 1713   LDLCALC 58 09/02/2018 1157   LDLCALC 92 09/16/2012 1322      Wt Readings from Last 3 Encounters:  02/22/19 208 lb 3.2 oz (94.4 kg)  01/04/19 209 lb (94.8 kg)  12/20/18 209 lb 7 oz (95 kg)       No flowsheet data found.    ASSESSMENT AND PLAN:  1.  Peripheral arterial disease: Stable bilateral leg claudication.  Most recent vascular studies in September showed stable ABI of 0.5 range.  2. Coronary artery disease involving native coronary arteries without angina: Lexiscan Myoview in November 2019 showed small apical infarct with only mild peri-infarct ischemia and normal ejection fraction.  Continue medical therapy.  3. Renovascular hypertension: The patient's blood pressure continues to be elevated in spite of of 4 different antihypertensive medications. She was  also supposed to be on hydralazine but she has not been taking the medications. I have refilled hydralazine 25 mg 3 times daily. Continue other medications. I suspect that she will require renal artery angiography and revascularization in order to control her blood pressure but will wait few months.  4. Hyperlipidemia: The dose of rosuvastatin was increased to 40 mg once daily.  Most recent lipid profile showed an LDL of 63.  5.  Chronic diastolic heart failure: She appears to be euvolemic on current dose of furosemide   Disposition:   FU with me in 3  months  Signed,  [name], MD  02/22/2019 11:49 AM    [place]

## 2019-02-24 ENCOUNTER — Telehealth: Payer: Self-pay | Admitting: *Deleted

## 2019-02-24 DIAGNOSIS — I739 Peripheral vascular disease, unspecified: Secondary | ICD-10-CM

## 2019-02-24 DIAGNOSIS — Z01818 Encounter for other preprocedural examination: Secondary | ICD-10-CM

## 2019-02-24 NOTE — Telephone Encounter (Signed)
Spoke with patient and she has been taking the Hydralazine 25 mg three times a day. Patient was seen 02/22/2019 and patient thought she was taking only once a day. Will forward to Dr Fletcher [name] for review

## 2019-02-28 NOTE — Telephone Encounter (Signed)
That means that she is taking 5 different blood pressure medications and in spite of that her blood pressure is not controlled.  That is probably because of bilateral renal artery stenosis.  In that case, I recommend proceeding with renal artery angiography and possible stent placement.  We can discuss tomorrow about the timing.

## 2019-03-01 NOTE — Telephone Encounter (Signed)
Spoke with the patient. She is agreeable to having the procedure. She will speak with her children and call back with a good date.

## 2019-03-02 NOTE — Telephone Encounter (Signed)
Follow Up:     Calling you back, concerning her procedure she needs.

## 2019-03-02 NOTE — Telephone Encounter (Signed)
Spoke with the patient. She stated that she would like to have her renal artery angiography on 1/27. She has been advised that I will get it scheduled and call her back with instructions.

## 2019-03-03 ENCOUNTER — Other Ambulatory Visit: Payer: Self-pay | Admitting: Family

## 2019-03-04 NOTE — Telephone Encounter (Addendum)
The procedure has been scheduled for 1/27. Lab orders and instructions will be mailed to the patient.    @[facility] @ [facility] [address] [place] [address] Dept: [phone] Loc: [phone]  Leah Ferrell  03/04/2019  You are scheduled for a Peripheral Angiogram on Wednesday, January 27 with Dr. [name].  1. Please arrive at the [facility] (Main Entrance A) at [facility]: [address] [place],  [address] at 6:30 AM (This time is two hours before your procedure to ensure your preparation). Free valet parking service is available.   Special note: Every effort is made to have your procedure done on time. Please understand that emergencies sometimes delay scheduled procedures.  2. Diet: Do not eat solid foods after midnight.  The patient may have clear liquids until 5am upon the day of the procedure.  3. Labs: You will need to have blood drawn before your covid test. You may go to any [facility] or to the [facility] office. If you come to the [facility] office, you do not need an appointment. The lab opens at 8 am and closes at [place] do not need to be fasting.  You will need to have the coronavirus test completed prior to your procedure. An appointment has been made at 10 am  on 03/14/19. This is a Drive Up Visit at the [facility] will direct you to the appropriate testing line. Please tell them that you are there for procedure testing. Stay in your car and someone will be with you shortly. Please make sure to have all other labs completed before this test because you will need to stay quarantined until your procedure.   4. Medication instructions in preparation for your procedure: Please hold all diabetic medication the morning of the procedure. Please hold furosemide the morning of the procedure. Hold potassium the morning of the procedure. Hold  spironolactone the morning of the procedure.    On the morning of your procedure, take your Aspirin and Plavix/Clopidogrel and any morning medicines NOT listed above.  You may use sips of water.  5. Plan for one night stay--bring personal belongings. 6. Bring a current list of your medications and current insurance cards. 7. You MUST have a responsible person to drive you home. 8. Someone MUST be with you the first 24 hours after you arrive home or your discharge will be delayed. 9. Please wear clothes that are easy to get on and off and wear slip-on shoes.  Thank you for allowing [place] to care for you!   -- [facility]

## 2019-03-08 ENCOUNTER — Encounter: Payer: Self-pay | Admitting: *Deleted

## 2019-03-10 ENCOUNTER — Other Ambulatory Visit: Payer: Self-pay

## 2019-03-11 ENCOUNTER — Encounter: Payer: Self-pay | Admitting: Family

## 2019-03-11 ENCOUNTER — Ambulatory Visit (INDEPENDENT_AMBULATORY_CARE_PROVIDER_SITE_OTHER): Payer: Medicare Other | Admitting: Family

## 2019-03-11 VITALS — BP 165/60 | HR 69 | Temp 98.4°F | Ht 62.0 in | Wt 208.0 lb

## 2019-03-11 DIAGNOSIS — E1159 Type 2 diabetes mellitus with other circulatory complications: Secondary | ICD-10-CM

## 2019-03-11 DIAGNOSIS — I10 Essential (primary) hypertension: Secondary | ICD-10-CM

## 2019-03-11 DIAGNOSIS — E139 Other specified diabetes mellitus without complications: Secondary | ICD-10-CM

## 2019-03-11 DIAGNOSIS — E1169 Type 2 diabetes mellitus with other specified complication: Secondary | ICD-10-CM

## 2019-03-11 DIAGNOSIS — I252 Old myocardial infarction: Secondary | ICD-10-CM

## 2019-03-11 DIAGNOSIS — I2510 Atherosclerotic heart disease of native coronary artery without angina pectoris: Secondary | ICD-10-CM | POA: Diagnosis not present

## 2019-03-11 DIAGNOSIS — E785 Hyperlipidemia, unspecified: Secondary | ICD-10-CM

## 2019-03-11 DIAGNOSIS — I739 Peripheral vascular disease, unspecified: Secondary | ICD-10-CM | POA: Diagnosis not present

## 2019-03-11 DIAGNOSIS — Z955 Presence of coronary angioplasty implant and graft: Secondary | ICD-10-CM

## 2019-03-11 DIAGNOSIS — E1151 Type 2 diabetes mellitus with diabetic peripheral angiopathy without gangrene: Secondary | ICD-10-CM | POA: Diagnosis not present

## 2019-03-11 DIAGNOSIS — I5032 Chronic diastolic (congestive) heart failure: Secondary | ICD-10-CM

## 2019-03-11 DIAGNOSIS — I152 Hypertension secondary to endocrine disorders: Secondary | ICD-10-CM

## 2019-03-11 LAB — BAYER DCA HB A1C WAIVED: HB A1C (BAYER DCA - WAIVED): 8.3 % — ABNORMAL HIGH (ref <7.0)

## 2019-03-11 NOTE — Patient Instructions (Signed)

## 2019-03-11 NOTE — Progress Notes (Signed)
Subjective:    Patient ID: Leah Ferrell, female    DOB: 1950-05-03, 69 y.o.   MRN: [id number]  Chief Complaint  Patient presents with  . Medical Management of Chronic Issues   Pt presents to the office today for chronic follow up. Pt has CHF, CAD and hx MI and is followed by Cardiologists every 6 months. PT is followed by Vascular for PVD and PAD. PT states these are stable.   Pt is scheduled for a renal angiography on 03/16/19.   She reports her diet has been bad during the holidays and her glucose has been elevated.  Diabetes She presents for her follow-up diabetic visit. She has type 2 diabetes mellitus. Her disease course has been worsening. There are no hypoglycemic associated symptoms. Pertinent negatives for hypoglycemia include no hunger. Associated symptoms include fatigue and foot paresthesias. Pertinent negatives for diabetes include no blurred vision and no foot ulcerations. Symptoms are worsening. Diabetic complications include heart disease, nephropathy and peripheral neuropathy. Risk factors for coronary artery disease include dyslipidemia, diabetes mellitus, hypertension, sedentary lifestyle and post-menopausal. She is following a generally unhealthy diet. Her overall blood glucose range is 180-200 mg/dl. An ACE inhibitor/angiotensin II receptor blocker is being taken. Eye exam is current.  Hypertension This is a chronic problem. The current episode started more than 1 year ago. The problem has been waxing and waning since onset. The problem is uncontrolled. Associated symptoms include malaise/fatigue and peripheral edema ("a little"). Pertinent negatives include no blurred vision or shortness of breath. Risk factors for coronary artery disease include dyslipidemia, diabetes mellitus, obesity and sedentary lifestyle. Hypertensive end-organ damage includes CAD/MI and heart failure.  Hyperlipidemia This is a chronic problem. The current episode started more than 1 year ago. The  problem is controlled. Recent lipid tests were reviewed and are normal. Exacerbating diseases include obesity. Pertinent negatives include no shortness of breath. Current antihyperlipidemic treatment includes statins. The current treatment provides moderate improvement of lipids. Risk factors for coronary artery disease include diabetes mellitus, dyslipidemia, hypertension, a sedentary lifestyle and post-menopausal.  Congestive Heart Failure Presents for follow-up visit. Associated symptoms include fatigue and orthopnea. Pertinent negatives include no shortness of breath. The symptoms have been stable.      Review of Systems  Constitutional: Positive for fatigue and malaise/fatigue.  Eyes: Negative for blurred vision.  Respiratory: Negative for shortness of breath.   All other systems reviewed and are negative.      Objective:   Physical Exam Vitals reviewed.  Constitutional:      General: She is not in acute distress.    Appearance: She is well-developed.  HENT:     Head: Normocephalic and atraumatic.     Right Ear: Tympanic membrane normal.     Left Ear: Tympanic membrane normal.  Eyes:     Pupils: Pupils are equal, round, and reactive to light.  Neck:     Thyroid: No thyromegaly.  Cardiovascular:     Rate and Rhythm: Normal rate and regular rhythm.     Heart sounds: Normal heart sounds. No murmur.  Pulmonary:     Effort: Pulmonary effort is normal. No respiratory distress.     Breath sounds: Normal breath sounds. No wheezing.  Abdominal:     General: Bowel sounds are normal. There is no distension.     Palpations: Abdomen is soft.     Tenderness: There is no abdominal tenderness.  Musculoskeletal:        General: No tenderness. Normal range of motion.  Cervical back: Normal range of motion and neck supple.     Left lower leg: Edema (trace) present.  Skin:    General: Skin is warm and dry.  Neurological:     Mental Status: She is alert and oriented to person, place,  and time.     Cranial Nerves: No cranial nerve deficit.     Deep Tendon Reflexes: Reflexes are normal and symmetric.  Psychiatric:        Behavior: Behavior normal.        Thought Content: Thought content normal.        Judgment: Judgment normal.       BP (!) 165/60   Pulse 69   Temp 98.4 F (36.9 C) (Temporal)   Ht 5' 2"  (1.575 m)   Wt 208 lb (94.3 kg)   BMI 38.04 kg/m      Assessment & Plan:  Leah Ferrell comes in today with chief complaint of Medical Management of Chronic Issues   Diagnosis and orders addressed:  1. Diabetes mellitus with peripheral vascular disease ([place]) - Bayer DCA Hb A1c Waived - CMP14+EGFR - CBC with Differential/Platelet  2. Hypertension associated with diabetes ([place]) - CMP14+EGFR - CBC with Differential/Platelet  3. Coronary artery disease involving native coronary artery of native heart without angina pectoris - CMP14+EGFR - CBC with Differential/Platelet  4. PAD (peripheral artery disease) ([facility]) - CMP14+EGFR - CBC with Differential/Platelet  5. Chronic diastolic heart failure ([facility]) - CMP14+EGFR - CBC with Differential/Platelet  6. PVD (peripheral vascular disease) ([facility]) - CMP14+EGFR - CBC with Differential/Platelet  7. 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 Reports last time she took long acting insulin it made her hunger and gave her neck pain. She states she can not afford another new medication. She wants to try managing with her diet and continue her TID Humalog.   - CMP14+EGFR - CBC with Differential/Platelet  9. Hx of non-ST elevation myocardial infarction (NSTEMI)  - CMP14+EGFR - CBC with Differential/Platelet  10. Morbid obesity ([place]) - CMP14+EGFR - CBC with Differential/Platelet  11. S/P primary angioplasty with coronary stent -  CMP14+EGFR - CBC with Differential/Platelet   Labs pending Health Maintenance reviewed Diet and exercise encouraged  Follow up plan:  3 months and keep specialists appts   Leah Dun, FNP

## 2019-03-12 ENCOUNTER — Other Ambulatory Visit: Payer: Self-pay | Admitting: Family

## 2019-03-12 DIAGNOSIS — I5032 Chronic diastolic (congestive) heart failure: Secondary | ICD-10-CM

## 2019-03-12 DIAGNOSIS — I152 Hypertension secondary to endocrine disorders: Secondary | ICD-10-CM

## 2019-03-12 DIAGNOSIS — E1159 Type 2 diabetes mellitus with other circulatory complications: Secondary | ICD-10-CM

## 2019-03-12 DIAGNOSIS — I2510 Atherosclerotic heart disease of native coronary artery without angina pectoris: Secondary | ICD-10-CM

## 2019-03-12 LAB — CBC WITH DIFFERENTIAL/PLATELET
Basophils Absolute: 0.1 10*3/uL (ref 0.0–0.2)
Basos: 1 %
EOS (ABSOLUTE): 0.2 10*3/uL (ref 0.0–0.4)
Eos: 2 %
Hematocrit: 41.4 % (ref 34.0–46.6)
Hemoglobin: 13.5 g/dL (ref 11.1–15.9)
Immature Grans (Abs): 0 10*3/uL (ref 0.0–0.1)
Immature Granulocytes: 0 %
Lymphocytes Absolute: 3.1 10*3/uL (ref 0.7–3.1)
Lymphs: 29 %
MCH: 30.4 pg (ref 26.6–33.0)
MCHC: 32.6 g/dL (ref 31.5–35.7)
MCV: 93 fL (ref 79–97)
Monocytes Absolute: 0.5 10*3/uL (ref 0.1–0.9)
Monocytes: 5 %
Neutrophils Absolute: 6.6 10*3/uL (ref 1.4–7.0)
Neutrophils: 63 %
Platelets: 279 10*3/uL (ref 150–450)
RBC: 4.44 x10E6/uL (ref 3.77–5.28)
RDW: 13.9 % (ref 11.7–15.4)
WBC: 10.5 10*3/uL (ref 3.4–10.8)

## 2019-03-12 LAB — CMP14+EGFR
ALT: 15 IU/L (ref 0–32)
AST: 14 IU/L (ref 0–40)
Albumin/Globulin Ratio: 1.4 (ref 1.2–2.2)
Albumin: 4.2 g/dL (ref 3.8–4.8)
Alkaline Phosphatase: 62 IU/L (ref 39–117)
BUN/Creatinine Ratio: 28 (ref 12–28)
BUN: 33 mg/dL — ABNORMAL HIGH (ref 8–27)
Bilirubin Total: <0.2 mg/dL (ref 0.0–1.2)
CO2: 24 mmol/L (ref 20–29)
Calcium: 9.4 mg/dL (ref 8.7–10.3)
Chloride: 95 mmol/L — ABNORMAL LOW (ref 96–106)
Creatinine, Ser: 1.19 mg/dL — ABNORMAL HIGH (ref 0.57–1.00)
GFR calc Af Amer: 55 mL/min/{1.73_m2} — ABNORMAL LOW (ref >59)
GFR calc non Af Amer: 47 mL/min/{1.73_m2} — ABNORMAL LOW (ref >59)
Globulin, Total: 3.1 g/dL (ref 1.5–4.5)
Glucose: 229 mg/dL — ABNORMAL HIGH (ref 65–99)
Potassium: 4.7 mmol/L (ref 3.5–5.2)
Sodium: 133 mmol/L — ABNORMAL LOW (ref 134–144)
Total Protein: 7.3 g/dL (ref 6.0–8.5)

## 2019-03-14 ENCOUNTER — Other Ambulatory Visit: Payer: Self-pay

## 2019-03-14 ENCOUNTER — Telehealth: Payer: Self-pay | Admitting: *Deleted

## 2019-03-14 ENCOUNTER — Other Ambulatory Visit (HOSPITAL_COMMUNITY)
Admission: RE | Admit: 2019-03-14 | Discharge: 2019-03-14 | Disposition: A | Discharge status: Home / Self Care | Payer: Medicare Other | Source: Ambulatory Visit | Attending: Cardiovascular Disease | Admitting: Cardiovascular Disease

## 2019-03-14 DIAGNOSIS — Z01812 Encounter for preprocedural laboratory examination: Secondary | ICD-10-CM | POA: Insufficient documentation

## 2019-03-14 DIAGNOSIS — Z20822 Contact with and (suspected) exposure to covid-19: Secondary | ICD-10-CM | POA: Insufficient documentation

## 2019-03-14 LAB — SARS CORONAVIRUS 2 (TAT 6-24 HRS): SARS Coronavirus 2: NEGATIVE

## 2019-03-14 NOTE — Telephone Encounter (Signed)
Pt contacted pre-catheterization scheduled at [facility] for: Wednesday March 16, 2019 8:30AM Verified arrival time and place: [facility]) at: 6:30 AM   No solid food after midnight prior to cath, clear liquids until 5 AM day of procedure. Contrast allergy: no  Hold: Insulin-AM of procedure. Lisinopril-day before and day of procedure-GFR 47 Lasix/KCl-day before and day of procedure-GFR 47 Spironolactone-day before and day of procedure-GFR 47  Except hold medications AM meds can be  taken pre-cath with sip of water including: ASA 81 mg Plavix 75 mg  Confirmed patient has responsible adult to drive home post procedure and observe 24 hours after arriving home: yes  Currently, due to Covid-19 pandemic, only one person will be allowed with patient. Must be the same person for patient's entire stay and will be required to wear a mask. They will be asked to wait in the waiting room for the duration of the patient's stay.  Patients are required to wear a mask when they enter the hospital.  . In the past 7 to 10 days have you had a cough,  shortness of breath, headache, congestion, fever (100 or greater) body aches, chills, sore throat, or sudden loss of taste or sense of smell? no   I reviewed procedure/mask/visitor instructions, pt verbalized understanding, thanked me for call.

## 2019-03-16 ENCOUNTER — Other Ambulatory Visit: Payer: Self-pay

## 2019-03-16 ENCOUNTER — Other Ambulatory Visit: Payer: Self-pay | Admitting: *Deleted

## 2019-03-16 ENCOUNTER — Encounter (HOSPITAL_COMMUNITY)
Admission: RE | Disposition: A | Discharge status: Home / Self Care | Payer: Self-pay | Source: Home / Self Care | Attending: Cardiovascular Disease

## 2019-03-16 ENCOUNTER — Encounter (HOSPITAL_COMMUNITY): Payer: Self-pay | Admitting: Cardiovascular Disease

## 2019-03-16 ENCOUNTER — Ambulatory Visit (HOSPITAL_COMMUNITY)
Admission: RE | Admit: 2019-03-16 | Discharge: 2019-03-17 | Disposition: A | Discharge status: Home / Self Care | Payer: Medicare Other | Attending: Cardiovascular Disease | Admitting: Cardiovascular Disease

## 2019-03-16 DIAGNOSIS — Z79899 Other long term (current) drug therapy: Secondary | ICD-10-CM | POA: Insufficient documentation

## 2019-03-16 DIAGNOSIS — Z6838 Body mass index (BMI) 38.0-38.9, adult: Secondary | ICD-10-CM | POA: Insufficient documentation

## 2019-03-16 DIAGNOSIS — E785 Hyperlipidemia, unspecified: Secondary | ICD-10-CM | POA: Diagnosis not present

## 2019-03-16 DIAGNOSIS — Z833 Family history of diabetes mellitus: Secondary | ICD-10-CM | POA: Diagnosis not present

## 2019-03-16 DIAGNOSIS — Z7902 Long term (current) use of antithrombotics/antiplatelets: Secondary | ICD-10-CM | POA: Diagnosis not present

## 2019-03-16 DIAGNOSIS — I150 Renovascular hypertension: Secondary | ICD-10-CM | POA: Diagnosis present

## 2019-03-16 DIAGNOSIS — I110 Hypertensive heart disease with heart failure: Secondary | ICD-10-CM | POA: Insufficient documentation

## 2019-03-16 DIAGNOSIS — Z8249 Family history of ischemic heart disease and other diseases of the circulatory system: Secondary | ICD-10-CM | POA: Insufficient documentation

## 2019-03-16 DIAGNOSIS — Z7982 Long term (current) use of aspirin: Secondary | ICD-10-CM | POA: Diagnosis not present

## 2019-03-16 DIAGNOSIS — I252 Old myocardial infarction: Secondary | ICD-10-CM | POA: Insufficient documentation

## 2019-03-16 DIAGNOSIS — Z95828 Presence of other vascular implants and grafts: Secondary | ICD-10-CM | POA: Insufficient documentation

## 2019-03-16 DIAGNOSIS — I701 Atherosclerosis of renal artery: Secondary | ICD-10-CM | POA: Diagnosis not present

## 2019-03-16 DIAGNOSIS — I2510 Atherosclerotic heart disease of native coronary artery without angina pectoris: Secondary | ICD-10-CM | POA: Diagnosis not present

## 2019-03-16 DIAGNOSIS — E1151 Type 2 diabetes mellitus with diabetic peripheral angiopathy without gangrene: Secondary | ICD-10-CM | POA: Diagnosis not present

## 2019-03-16 DIAGNOSIS — I739 Peripheral vascular disease, unspecified: Secondary | ICD-10-CM

## 2019-03-16 DIAGNOSIS — I6522 Occlusion and stenosis of left carotid artery: Secondary | ICD-10-CM | POA: Diagnosis not present

## 2019-03-16 DIAGNOSIS — Z955 Presence of coronary angioplasty implant and graft: Secondary | ICD-10-CM | POA: Insufficient documentation

## 2019-03-16 DIAGNOSIS — Z794 Long term (current) use of insulin: Secondary | ICD-10-CM | POA: Insufficient documentation

## 2019-03-16 DIAGNOSIS — Z87891 Personal history of nicotine dependence: Secondary | ICD-10-CM | POA: Insufficient documentation

## 2019-03-16 DIAGNOSIS — E7800 Pure hypercholesterolemia, unspecified: Secondary | ICD-10-CM

## 2019-03-16 DIAGNOSIS — I5032 Chronic diastolic (congestive) heart failure: Secondary | ICD-10-CM | POA: Diagnosis not present

## 2019-03-16 HISTORY — PX: PERIPHERAL VASCULAR INTERVENTION: CATH118257

## 2019-03-16 HISTORY — PX: RENAL ANGIOGRAPHY: CATH118260

## 2019-03-16 LAB — GLUCOSE, CAPILLARY
Glucose-Capillary: 247 mg/dL — ABNORMAL HIGH (ref 70–99)
Glucose-Capillary: 206 mg/dL — ABNORMAL HIGH (ref 70–99)
Glucose-Capillary: 290 mg/dL — ABNORMAL HIGH (ref 70–99)
Glucose-Capillary: 215 mg/dL — ABNORMAL HIGH (ref 70–99)

## 2019-03-16 LAB — POCT ACTIVATED CLOTTING TIME: Activated Clotting Time: 318 seconds

## 2019-03-16 SURGERY — RENAL ANGIOGRAPHY
Anesthesia: LOCAL | Laterality: Bilateral

## 2019-03-16 MED ORDER — OMEGA-3 FATTY ACIDS 1000 MG PO CAPS: 1.0000 g | ORAL_CAPSULE | Freq: Every day | ORAL | Status: DC

## 2019-03-16 MED ORDER — FUROSEMIDE 40 MG PO TABS
40.0000 mg | ORAL_TABLET | Freq: Every day | ORAL | Status: DC
  Administered 2019-03-17: 40 mg via ORAL
  Filled 2019-03-16: qty 1

## 2019-03-16 MED ORDER — MIDAZOLAM HCL 2 MG/2ML IJ SOLN
INTRAMUSCULAR | Status: AC
  Filled 2019-03-16: qty 2

## 2019-03-16 MED ORDER — ASCORBIC ACID 500 MG PO TABS
1000.0000 mg | ORAL_TABLET | Freq: Three times a day (TID) | ORAL | Status: DC
  Administered 2019-03-16 – 2019-03-17 (×3): 1000 mg via ORAL
  Filled 2019-03-16 (×3): qty 2

## 2019-03-16 MED ORDER — CARVEDILOL 12.5 MG PO TABS
12.5000 mg | ORAL_TABLET | Freq: Two times a day (BID) | ORAL | Status: DC
  Administered 2019-03-16 – 2019-03-17 (×2): 12.5 mg via ORAL
  Filled 2019-03-16 (×2): qty 1

## 2019-03-16 MED ORDER — ROSUVASTATIN CALCIUM 20 MG PO TABS
40.0000 mg | ORAL_TABLET | Freq: Every evening | ORAL | Status: DC
  Administered 2019-03-16: 40 mg via ORAL
  Filled 2019-03-16: qty 2

## 2019-03-16 MED ORDER — SODIUM CHLORIDE 0.9 % IV SOLN: 250.0000 mL | INTRAVENOUS | Status: DC | PRN

## 2019-03-16 MED ORDER — ASPIRIN EC 81 MG PO TBEC
81.0000 mg | DELAYED_RELEASE_TABLET | Freq: Every day | ORAL | Status: DC
  Administered 2019-03-17: 81 mg via ORAL
  Filled 2019-03-16: qty 1

## 2019-03-16 MED ORDER — LIDOCAINE HCL (PF) 1 % IJ SOLN
INTRAMUSCULAR | Status: AC
  Filled 2019-03-16: qty 30

## 2019-03-16 MED ORDER — SODIUM CHLORIDE 0.9% FLUSH: 3.0000 mL | INTRAVENOUS | Status: DC | PRN

## 2019-03-16 MED ORDER — OMEGA-3-ACID ETHYL ESTERS 1 G PO CAPS
1.0000 g | ORAL_CAPSULE | Freq: Every day | ORAL | Status: DC
  Administered 2019-03-17: 10:00:00 1 g via ORAL
  Filled 2019-03-16: qty 1

## 2019-03-16 MED ORDER — CALTRATE 600+D PLUS MINERALS 600-800 MG-UNIT PO TABS: 1.0000 | ORAL_TABLET | Freq: Three times a day (TID) | ORAL | Status: DC

## 2019-03-16 MED ORDER — SODIUM CHLORIDE 0.9 % IV SOLN: INTRAVENOUS | Status: DC

## 2019-03-16 MED ORDER — MIDAZOLAM HCL 2 MG/2ML IJ SOLN
INTRAMUSCULAR | Status: DC | PRN
  Administered 2019-03-16 (×2): 1 mg via INTRAVENOUS

## 2019-03-16 MED ORDER — CALCIUM CARBONATE-VITAMIN D 500-200 MG-UNIT PO TABS
1.0000 | ORAL_TABLET | Freq: Three times a day (TID) | ORAL | Status: DC
  Administered 2019-03-16 – 2019-03-17 (×3): 1 via ORAL
  Filled 2019-03-16 (×3): qty 1

## 2019-03-16 MED ORDER — AMLODIPINE BESYLATE 10 MG PO TABS
10.0000 mg | ORAL_TABLET | Freq: Every evening | ORAL | Status: DC
  Administered 2019-03-16: 10 mg via ORAL
  Filled 2019-03-16: qty 1

## 2019-03-16 MED ORDER — IODIXANOL 320 MG/ML IV SOLN
INTRAVENOUS | Status: DC | PRN
  Administered 2019-03-16: 85 mL

## 2019-03-16 MED ORDER — SODIUM CHLORIDE 0.9% FLUSH
3.0000 mL | Freq: Two times a day (BID) | INTRAVENOUS | Status: DC
  Administered 2019-03-16 – 2019-03-17 (×2): 3 mL via INTRAVENOUS

## 2019-03-16 MED ORDER — ASPIRIN 81 MG PO CHEW: 81.0000 mg | CHEWABLE_TABLET | ORAL | Status: DC

## 2019-03-16 MED ORDER — CLOPIDOGREL BISULFATE 75 MG PO TABS
75.0000 mg | ORAL_TABLET | Freq: Every day | ORAL | Status: DC
  Administered 2019-03-17: 75 mg via ORAL
  Filled 2019-03-16: qty 1

## 2019-03-16 MED ORDER — SODIUM CHLORIDE 0.9 % WEIGHT BASED INFUSION
1.0000 mL/kg/h | INTRAVENOUS | Status: AC
  Administered 2019-03-16: 1 mL/kg/h via INTRAVENOUS

## 2019-03-16 MED ORDER — HEPARIN SODIUM (PORCINE) 1000 UNIT/ML IJ SOLN
INTRAMUSCULAR | Status: DC | PRN
  Administered 2019-03-16: 8000 [IU] via INTRAVENOUS

## 2019-03-16 MED ORDER — NITROGLYCERIN 0.4 MG SL SUBL: 0.4000 mg | SUBLINGUAL_TABLET | SUBLINGUAL | Status: DC | PRN

## 2019-03-16 MED ORDER — FENTANYL CITRATE (PF) 100 MCG/2ML IJ SOLN
INTRAMUSCULAR | Status: AC
  Filled 2019-03-16: qty 2

## 2019-03-16 MED ORDER — MONTELUKAST SODIUM 10 MG PO TABS
10.0000 mg | ORAL_TABLET | Freq: Every day | ORAL | Status: DC
  Administered 2019-03-16: 18:00:00 10 mg via ORAL
  Filled 2019-03-16: qty 1

## 2019-03-16 MED ORDER — INSULIN ASPART 100 UNIT/ML ~~LOC~~ SOLN
0.0000 [IU] | Freq: Three times a day (TID) | SUBCUTANEOUS | Status: DC
  Administered 2019-03-16 – 2019-03-17 (×2): 8 [IU] via SUBCUTANEOUS
  Administered 2019-03-17: 5 [IU] via SUBCUTANEOUS

## 2019-03-16 MED ORDER — LIDOCAINE HCL (PF) 1 % IJ SOLN
INTRAMUSCULAR | Status: DC | PRN
  Administered 2019-03-16: 18 mL

## 2019-03-16 MED ORDER — HEPARIN (PORCINE) IN NACL 1000-0.9 UT/500ML-% IV SOLN
INTRAVENOUS | Status: AC
  Filled 2019-03-16: qty 1000

## 2019-03-16 MED ORDER — SODIUM CHLORIDE 0.9% FLUSH: 3.0000 mL | Freq: Two times a day (BID) | INTRAVENOUS | Status: DC

## 2019-03-16 MED ORDER — POTASSIUM CHLORIDE CRYS ER 10 MEQ PO TBCR
10.0000 meq | EXTENDED_RELEASE_TABLET | Freq: Every day | ORAL | Status: DC
  Administered 2019-03-17: 10 meq via ORAL
  Filled 2019-03-16: qty 1

## 2019-03-16 MED ORDER — LABETALOL HCL 5 MG/ML IV SOLN
INTRAVENOUS | Status: AC
  Filled 2019-03-16: qty 4

## 2019-03-16 MED ORDER — LABETALOL HCL 5 MG/ML IV SOLN
10.0000 mg | INTRAVENOUS | Status: DC | PRN
  Administered 2019-03-16: 11:00:00 10 mg via INTRAVENOUS

## 2019-03-16 MED ORDER — ONDANSETRON HCL 4 MG/2ML IJ SOLN: 4.0000 mg | Freq: Four times a day (QID) | INTRAMUSCULAR | Status: DC | PRN

## 2019-03-16 MED ORDER — HEPARIN (PORCINE) IN NACL 1000-0.9 UT/500ML-% IV SOLN
INTRAVENOUS | Status: DC | PRN
  Administered 2019-03-16 (×2): 500 mL

## 2019-03-16 MED ORDER — HEPARIN SODIUM (PORCINE) 1000 UNIT/ML IJ SOLN
INTRAMUSCULAR | Status: AC
  Filled 2019-03-16: qty 1

## 2019-03-16 MED ORDER — ACETAMINOPHEN 325 MG PO TABS
650.0000 mg | ORAL_TABLET | Freq: Four times a day (QID) | ORAL | Status: DC | PRN
  Administered 2019-03-16: 650 mg via ORAL
  Filled 2019-03-16: qty 2

## 2019-03-16 MED ORDER — LISINOPRIL 20 MG PO TABS
20.0000 mg | ORAL_TABLET | Freq: Two times a day (BID) | ORAL | Status: DC
  Administered 2019-03-17: 20 mg via ORAL
  Filled 2019-03-16: qty 1

## 2019-03-16 MED ORDER — HYPROMELLOSE (GONIOSCOPIC) 2.5 % OP SOLN
1.0000 [drp] | Freq: Three times a day (TID) | OPHTHALMIC | Status: DC | PRN
  Filled 2019-03-16: qty 15

## 2019-03-16 MED ORDER — FENTANYL CITRATE (PF) 100 MCG/2ML IJ SOLN
INTRAMUSCULAR | Status: DC | PRN
  Administered 2019-03-16 (×2): 25 ug via INTRAVENOUS
  Administered 2019-03-16: 50 ug via INTRAVENOUS

## 2019-03-16 MED ORDER — VITAMIN D 25 MCG (1000 UNIT) PO TABS
5000.0000 [IU] | ORAL_TABLET | Freq: Every day | ORAL | Status: DC
  Administered 2019-03-17: 5000 [IU] via ORAL
  Filled 2019-03-16: qty 5

## 2019-03-16 SURGICAL SUPPLY — 19 items
BALLN VIATRAC 4X15X135 (BALLOONS) ×2
BALLOON VIATRAC 4X15X135 (BALLOONS) IMPLANT
CATH ANGIO 5F PIGTAIL 65CM (CATHETERS) ×1 IMPLANT
CLOSURE MYNX CONTROL 6F/7F (Vascular Products) ×1 IMPLANT
GUIDE CATH VISTA RDC 6F (CATHETERS) ×1 IMPLANT
KIT ENCORE 26 ADVANTAGE (KITS) ×1 IMPLANT
KIT MICROPUNCTURE NIT STIFF (SHEATH) ×1 IMPLANT
KIT PV (KITS) ×2 IMPLANT
SHEATH PINNACLE 6F 10CM (SHEATH) ×1 IMPLANT
SHEATH PROBE COVER 6X72 (BAG) ×1 IMPLANT
STENT HERCULINK RX 6.5X12X135 (Permanent Stent) ×1 IMPLANT
STENT HERCULINK RX 6.5X15X135 (Permanent Stent) ×1 IMPLANT
STOPCOCK MORSE 400PSI 3WAY (MISCELLANEOUS) ×1 IMPLANT
SYR MEDRAD MARK 7 150ML (SYRINGE) ×2 IMPLANT
TRANSDUCER W/STOPCOCK (MISCELLANEOUS) ×2 IMPLANT
TRAY PV CATH (CUSTOM PROCEDURE TRAY) ×2 IMPLANT
TUBING CIL FLEX 10 FLL-RA (TUBING) ×1 IMPLANT
WIRE BENTSON .035X145CM (WIRE) ×1 IMPLANT
WIRE STABILIZER XS .014X180CM (WIRE) ×1 IMPLANT

## 2019-03-16 NOTE — Interval H&P Note (Signed)
History and Physical Interval Note:  03/16/2019 8:40 AM  Leah Ferrell  has presented today for surgery, with the diagnosis of Artery stenosis.  The various methods of treatment have been discussed with the patient and family. After consideration of risks, benefits and other options for treatment, the patient has consented to  Procedure(s): RENAL ANGIOGRAPHY (Bilateral) as a surgical intervention.  The patient's history has been reviewed, patient examined, no change in status, stable for surgery.  I have reviewed the patient's chart and labs.  Questions were answered to the patient's satisfaction.     [name]

## 2019-03-17 DIAGNOSIS — Z7902 Long term (current) use of antithrombotics/antiplatelets: Secondary | ICD-10-CM | POA: Diagnosis not present

## 2019-03-17 DIAGNOSIS — E1151 Type 2 diabetes mellitus with diabetic peripheral angiopathy without gangrene: Secondary | ICD-10-CM | POA: Diagnosis not present

## 2019-03-17 DIAGNOSIS — I150 Renovascular hypertension: Secondary | ICD-10-CM

## 2019-03-17 DIAGNOSIS — I252 Old myocardial infarction: Secondary | ICD-10-CM | POA: Diagnosis not present

## 2019-03-17 DIAGNOSIS — E7800 Pure hypercholesterolemia, unspecified: Secondary | ICD-10-CM

## 2019-03-17 DIAGNOSIS — E785 Hyperlipidemia, unspecified: Secondary | ICD-10-CM | POA: Diagnosis not present

## 2019-03-17 DIAGNOSIS — I6522 Occlusion and stenosis of left carotid artery: Secondary | ICD-10-CM | POA: Diagnosis not present

## 2019-03-17 DIAGNOSIS — I5032 Chronic diastolic (congestive) heart failure: Secondary | ICD-10-CM | POA: Diagnosis not present

## 2019-03-17 DIAGNOSIS — Z7982 Long term (current) use of aspirin: Secondary | ICD-10-CM | POA: Diagnosis not present

## 2019-03-17 DIAGNOSIS — I701 Atherosclerosis of renal artery: Secondary | ICD-10-CM | POA: Diagnosis not present

## 2019-03-17 DIAGNOSIS — Z833 Family history of diabetes mellitus: Secondary | ICD-10-CM | POA: Diagnosis not present

## 2019-03-17 DIAGNOSIS — Z955 Presence of coronary angioplasty implant and graft: Secondary | ICD-10-CM | POA: Diagnosis not present

## 2019-03-17 DIAGNOSIS — I2510 Atherosclerotic heart disease of native coronary artery without angina pectoris: Secondary | ICD-10-CM | POA: Diagnosis not present

## 2019-03-17 DIAGNOSIS — Z79899 Other long term (current) drug therapy: Secondary | ICD-10-CM | POA: Diagnosis not present

## 2019-03-17 DIAGNOSIS — Z8249 Family history of ischemic heart disease and other diseases of the circulatory system: Secondary | ICD-10-CM | POA: Diagnosis not present

## 2019-03-17 DIAGNOSIS — Z87891 Personal history of nicotine dependence: Secondary | ICD-10-CM | POA: Diagnosis not present

## 2019-03-17 DIAGNOSIS — Z794 Long term (current) use of insulin: Secondary | ICD-10-CM | POA: Diagnosis not present

## 2019-03-17 DIAGNOSIS — Z95828 Presence of other vascular implants and grafts: Secondary | ICD-10-CM | POA: Diagnosis not present

## 2019-03-17 DIAGNOSIS — I110 Hypertensive heart disease with heart failure: Secondary | ICD-10-CM | POA: Diagnosis not present

## 2019-03-17 LAB — BASIC METABOLIC PANEL
Anion gap: 13 (ref 5–15)
BUN: 15 mg/dL (ref 8–23)
CO2: 28 mmol/L (ref 22–32)
Calcium: 9.9 mg/dL (ref 8.9–10.3)
Chloride: 96 mmol/L — ABNORMAL LOW (ref 98–111)
Creatinine, Ser: 0.8 mg/dL (ref 0.44–1.00)
GFR calc Af Amer: >60 mL/min (ref >60)
GFR calc non Af Amer: >60 mL/min (ref >60)
Glucose, Bld: 213 mg/dL — ABNORMAL HIGH (ref 70–99)
Potassium: 4.5 mmol/L (ref 3.5–5.1)
Sodium: 137 mmol/L (ref 135–145)

## 2019-03-17 LAB — GLUCOSE, CAPILLARY
Glucose-Capillary: 236 mg/dL — ABNORMAL HIGH (ref 70–99)
Glucose-Capillary: 273 mg/dL — ABNORMAL HIGH (ref 70–99)

## 2019-03-17 LAB — HEMOGLOBIN A1C
Hgb A1c MFr Bld: 8.6 % — ABNORMAL HIGH (ref 4.8–5.6)
Mean Plasma Glucose: 200.12 mg/dL

## 2019-03-17 MED ORDER — SPIRONOLACTONE 25 MG PO TABS
25.0000 mg | ORAL_TABLET | Freq: Every day | ORAL | Status: DC
  Administered 2019-03-17: 11:00:00 25 mg via ORAL
  Filled 2019-03-17: qty 1

## 2019-03-17 NOTE — Discharge Summary (Addendum)
Discharge Summary    Patient ID: Leah Ferrell MRN: [id number]; DOB: 05/16/1950  Admit date: 03/16/2019 Discharge date: 03/17/2019  Primary Care Provider: Sharion Balloon, FNP  Primary Cardiologist: [name], MD  Primary Electrophysiologist:  None   Discharge Diagnoses    Active Problems:   Renovascular hypertension   Bilateral renal artery stenosis [facility])   Pure hypercholesterolemia   Diagnostic Studies/Procedures    Renal Vascular Catheterization 1. Severe bilateral renal artery stenosis. 2. Successful angioplasty and stent placement to bilateral renal arteries.  Recommendations: Continue dual antiplatelet therapy. Gentle hydration post procedure. Due to stenting of both renal arteries, there is risk of hypotension and thus I elected to observe the patient overnight. I am going to hold hydralazine and spironolactone for now and continue other antihypertensive medications. Likely discharge home tomorrow morning.  _____________   History of Present Illness     Leah Ferrell is a 69 y.o. female with pmh of PAD s/p left common iliac stent and left common femoral artery resection and bypass in 2015, chronically occluded SFA, CAD s/p DES to LAD x 2 (most recent in 2015 for in-stent restenosis), uncontrolled HTN, chronic diastolic Heart Failure (EF 65-70% G2DD), left carotid stenosis, DM2 who was admitted for renal artery angioplasty with possible stenting. Patient sees Dr. Fletcher [name] regularly. She was having persistent hypertension despite being on multiple therapies.  The patient therefore was scheduled for bilateral renal artery angioplasty with possible stent placement.  Hospital Course     Consultants: None  The patient was admitted 03/16/19 for her scheduled procedure. She was taken into the catheterization lab and right groin access was established.  She underwent successful angioplasty and stent placement to bilateral renal arteries.  The patient tolerated the  procedure well.  Post-procedure recommendations for DAPT with Aspirin and Plavix.  Also hydralazine and spironolactone were held in case of hypotension.  Other home antihypertensives amlodipine 10 mg, Coreg 12.5 mg twice daily, lisinopril 12 mg twice daily, Lasix 40 were continued. Blood pressures were mildly elevated, 154/51, rate 64bpm. Creatinine 0.80 and hemoglobin 13.5.  Patient denied chest pain or shortness of breath. She remained euvolemic. The right groin access site appeared stable with no tenderness, hematoma or bruit. She was walking about the room with no issues. We will plan to discharge patient on DAPT with aspirin and Plavix.  Continue Crestor.  Will add back spironolactone 25 mg daily.  We will not resume hydralazine on discharge.  she was instructed to taje her blood pressure daily and to call the office if SBP > than 160 or DBP > 90. Follow-up visit was made for early next week.   Patient was examined by Dr. Radford Pax on 03/17/2019 and felt to be stable for discharge.  Did the patient have an acute coronary syndrome (MI, NSTEMI, STEMI, etc) this admission?:  No                               Did the patient have a percutaneous coronary intervention (stent / angioplasty)?:  No  _____________  Discharge Vitals Blood pressure (!) 137/46, pulse 70, temperature 97.6 F (36.4 C), temperature source Oral, resp. rate 20, height 5\' 2"  (1.575 m), weight 92.5 kg, SpO2 98 %.  Filed Weights   03/16/19 0600 03/17/19 0518  Weight: 94.3 kg 92.5 kg    Labs & Radiologic Studies    CBC No results for input(s): WBC, NEUTROABS, HGB, HCT, MCV, PLT in  the last 72 hours. Basic Metabolic Panel Recent Labs    03/17/19 0307  NA 137  K 4.5  CL 96*  CO2 28  GLUCOSE 213*  BUN 15  CREATININE 0.80  CALCIUM 9.9   Liver Function Tests No results for input(s): AST, ALT, ALKPHOS, BILITOT, PROT, ALBUMIN in the last 72 hours. No results for input(s): LIPASE, AMYLASE in the last 72 hours. High  Sensitivity Troponin:   No results for input(s): TROPONINIHS in the last 720 hours.  BNP Invalid input(s): POCBNP D-Dimer No results for input(s): DDIMER in the last 72 hours. Hemoglobin A1C Recent Labs    03/17/19 0307  HGBA1C 8.6*   Fasting Lipid Panel No results for input(s): CHOL, HDL, LDLCALC, TRIG, CHOLHDL, LDLDIRECT in the last 72 hours. Thyroid Function Tests No results for input(s): TSH, T4TOTAL, T3FREE, THYROIDAB in the last 72 hours.  Invalid input(s): FREET3 _____________  PERIPHERAL VASCULAR CATHETERIZATION  Result Date: 03/16/2019 1.  Severe bilateral renal artery stenosis. 2.  Successful angioplasty and stent placement to bilateral renal arteries. Recommendations: Continue dual antiplatelet therapy. Gentle hydration post procedure. Due to stenting of both renal arteries, there is risk of hypotension and thus I elected to observe the patient overnight.  I am going to hold hydralazine and spironolactone for now and continue other antihypertensive medications. Likely discharge home tomorrow morning.  VAS US RENAL ARTERY DUPLEX  Result Date: 02/17/2019 ABDOMINAL VISCERAL Indications: Hypertension High Risk Factors: Hypertension, hyperlipidemia, past history of smoking,                    coronary artery disease. Other Factors: Morbidly obese. Vascular Interventions: Patient has known PAD and is s/p left common iliac                         artery stent placement and left common femoral artery                         resection and bypass from the left exteranl iliac to the                         left profunda artery. Limitations: Air/bowel gas and obesity. Performing Technologist: Jeneen Montgomery RDMS, RVT, RDCS  Examination Guidelines: A complete evaluation includes B-mode imaging, spectral Doppler, color Doppler, and power Doppler as needed of all accessible portions of each vessel. Bilateral testing is considered an integral part of a complete examination. Limited examinations  for reoccurring indications may be performed as noted.  Duplex Findings: +--------------------+--------+--------+------+--------+ Mesenteric          PSV cm/sEDV cm/sPlaqueComments +--------------------+--------+--------+------+--------+ Aorta Prox             81                          +--------------------+--------+--------+------+--------+ Aorta Distal           91                          +--------------------+--------+--------+------+--------+ Celiac Artery Origin  111                          +--------------------+--------+--------+------+--------+ SMA Origin            191                          +--------------------+--------+--------+------+--------+  Mesenteric Technologist observations: Tehnically challanging study due to abdominal girth and bowel gas. Limited visibility of abdominal aorta.    +------------------+--------+--------+-------+ Right Renal ArteryPSV cm/sEDV cm/sComment +------------------+--------+--------+-------+ Origin              377     199           +------------------+--------+--------+-------+ Proximal            214      80           +------------------+--------+--------+-------+ Mid                 132      44           +------------------+--------+--------+-------+ Distal               56      14           +------------------+--------+--------+-------+ +-----------------+--------+--------+-------+ Left Renal ArteryPSV cm/sEDV cm/sComment +-----------------+--------+--------+-------+ Origin              99      0            +-----------------+--------+--------+-------+ Proximal            82      0            +-----------------+--------+--------+-------+ Mid                404     249           +-----------------+--------+--------+-------+ Distal             235      82           +-----------------+--------+--------+-------+ +------------+--------+--------+----+-----------+--------+--------+----+  Right KidneyPSV cm/sEDV cm/sRI  Left KidneyPSV cm/sEDV cm/sRI   +------------+--------+--------+----+-----------+--------+--------+----+ Upper Pole  19      9       0.53Upper Pole 33      12      0.64 +------------+--------+--------+----+-----------+--------+--------+----+ Mid         23      9       0.61Mid        25      9       0.64 +------------+--------+--------+----+-----------+--------+--------+----+ Lower Pole  22      7       0.68Lower Pole 29      11      0.62 +------------+--------+--------+----+-----------+--------+--------+----+ Hilar       60      14      0.77Hilar      40      11      0.73 +------------+--------+--------+----+-----------+--------+--------+----+ +------------------+--------+------------------+--------+ Right Kidney              Left Kidney                +------------------+--------+------------------+--------+ RAR                       RAR                        +------------------+--------+------------------+--------+ RAR (manual)      4.65    RAR (manual)      4.99     +------------------+--------+------------------+--------+ Cortex                    Cortex                     +------------------+--------+------------------+--------+  Cortex thickness  11.00 mmCorex thickness   11.00 mm +------------------+--------+------------------+--------+ Kidney length (cm)11.70   Kidney length (cm)11.40    +------------------+--------+------------------+--------+  Summary: Largest Aortic Diameter: 1.9 cm  Renal:  Right: RRV flow present. Normal size right kidney. Normal cortical        thickness of right kidney. Evidence of a greater than 60%        stenosis of the right renal artery. Left:  Normal cortical thickness of the left kidney. Normal size of        left kidney. Cyst(s) noted. LRV flow present. Evidence of a >        60% stenosis in the left renal artery. Mesenteric: Normal Celiac artery and Superior Mesenteric  artery findings. Upper mid pole cortical cyst measuring 3.8 cm x 4.0 cm x 3.7 cm.  *See table(s) above for measurements and observations.  Vascular consult recommended. Diagnosing physician: Jenkins Rouge MD  Electronically signed by Jenkins Rouge MD on 02/17/2019 at 11:22:59 AM.    Final    Disposition   Pt is being discharged home today in good condition.  Follow-up Plans & Appointments    Follow-up Information    Wellington Hampshire, MD Follow up on 03/22/2019.   Specialty: Cardiology Why: Please go to hospital follow-up [facility] 2nd at 9:20 AM Contact information: [address] [place] [address] [phone]            Discharge Medications   Allergies as of 03/17/2019      Reactions   Tape Rash      Medication List    STOP taking these medications   hydrALAZINE 25 MG tablet Commonly known as: APRESOLINE     TAKE these medications   acetaminophen 325 MG tablet Commonly known as: TYLENOL Take 2 tablets (650 mg total) by mouth every 6 (six) hours as needed for mild pain (or Fever >/= 101).   albuterol 108 (90 Base) MCG/ACT inhaler Commonly known as: Proventil HFA INHALE ONE TO TWO PUFFS BY  MOUTH EVERY 6 HOURS AS  NEEDED FOR WHEEZING OR FOR  SHORTNESS OF BREATH What changed:   how much to take  how to take this  when to take this  reasons to take this  additional instructions   amLODipine 10 MG tablet Commonly known as: NORVASC Take 1 tablet (10 mg total) by mouth daily. What changed: when to take this   aspirin EC 81 MG tablet Take 81 mg by mouth daily. In the morning   Caltrate 600+D Plus Minerals 600-800 MG-UNIT Tabs Take 1 tablet by mouth 3 (three) times daily.   carvedilol 12.5 MG tablet Commonly known as: COREG Take 1 tablet (12.5 mg total) by mouth 2 (two) times daily.   clopidogrel 75 MG tablet Commonly known as: PLAVIX TAKE 1 TABLET BY MOUTH  DAILY   fish oil-omega-3 fatty acids 1000 MG capsule Take 1 g by mouth daily.    furosemide 40 MG tablet Commonly known as: LASIX TAKE 1 TABLET BY MOUTH  DAILY MAY TAKE ADDITIONAL  TABLET AS NEEDED FOR  SWELLING   HumaLOG KwikPen 200 UNIT/ML Sopn Generic drug: Insulin Lispro INJECT SUBCUTANEOUSLY 50  UNITS 3 TIMES DAILY BEFORE  MEALS What changed: See the new instructions.   hydroxypropyl methylcellulose / hypromellose 2.5 % ophthalmic solution Commonly known as: ISOPTO TEARS / GONIOVISC Place 1 drop into both eyes 3 (three) times daily as needed (for dry eyes).   lisinopril 20 MG tablet Commonly known as: ZESTRIL Take  2 tablets (40 mg total) by mouth daily. What changed:   how much to take  when to take this   montelukast 10 MG tablet Commonly known as: SINGULAIR Take 1 tablet by mouth once daily with breakfast What changed: See the new instructions.   nitroGLYCERIN 0.4 MG SL tablet Commonly known as: NITROSTAT Place 1 tablet (0.4 mg total) under the tongue every 5 (five) minutes x 3 doses as needed for chest pain (up to 3 doses).   potassium chloride SA 20 MEQ tablet Commonly known as: KLOR-CON TAKE 1 TABLET BY MOUTH  DAILY . TAKE EXTRA 10 MEQ  (1/2 TABLET) IF YOU TAKE  EXTRA LASIX What changed:   how much to take  how to take this  when to take this  additional instructions   ReliOn Pen Needles 29G X 12MM Misc Generic drug: Insulin Pen Needle USE 1 NEEDLE TO INJECT INSULIN SUBCUTANEOUSLY 3 TIMES DAILY What changed: See the new instructions.   BD ULTRA-FINE PEN NEEDLES 29G X 12.7MM Misc Generic drug: Insulin Pen Needle USE PENNEEDLES 3 TIMES  DAILY AS DIRECTED What changed: Another medication with the same name was changed. Make sure you understand how and when to take each.   rosuvastatin 40 MG tablet Commonly known as: CRESTOR TAKE 1 TABLET BY MOUTH  DAILY What changed: when to take this   spironolactone 25 MG tablet Commonly known as: ALDACTONE Take 1 tablet (25 mg total) by mouth daily.   vitamin C 1000 MG tablet Take  1,000 mg by mouth 3 (three) times daily.   Vitamin D-3 125 MCG (5000 UT) Tabs Take 5,000 Units by mouth daily.          Outstanding Labs/Studies   N/A  Duration of Discharge Encounter   Greater than 30 minutes including physician time.  Signed, Tanita Palinkas Ninfa Meeker, PA-C 03/17/2019, 3:28 PM

## 2019-03-17 NOTE — Plan of Care (Signed)
  Problem: Education: Goal: Knowledge of General Education information will improve Description: Including pain rating scale, medication(s)/side effects and non-pharmacologic comfort measures Outcome: Completed/Met

## 2019-03-17 NOTE — Progress Notes (Signed)
Discharged home with family, discharge instructions given to patient/daughter. Medication education is done and 2 stent cards were given to patient to carry with her at all time and to show it when ever she goes to the hospital. Patient/daughter verbalizes understanding.

## 2019-03-17 NOTE — Progress Notes (Signed)
Progress Note  Patient Name: Leah Ferrell Date of Encounter: 03/17/2019  Primary Cardiologist: [name], MD   Subjective   Patient is doing well this morning. She is up and about the room with no issues. Cath site right groin looks stable. No chest pain or SOB.  Inpatient Medications    Scheduled Meds: . amLODipine  10 mg Oral QPM  . vitamin C  1,000 mg Oral TID  . aspirin EC  81 mg Oral Daily  . calcium-vitamin D  1 tablet Oral TID  . carvedilol  12.5 mg Oral BID WC  . cholecalciferol  5,000 Units Oral Daily  . clopidogrel  75 mg Oral Daily  . furosemide  40 mg Oral Daily  . insulin aspart  0-15 Units Subcutaneous TID WC  . lisinopril  20 mg Oral BID  . montelukast  10 mg Oral q1800  . omega-3 acid ethyl esters  1 g Oral Daily  . potassium chloride SA  10 mEq Oral Daily  . rosuvastatin  40 mg Oral QPM  . sodium chloride flush  3 mL Intravenous Q12H   Continuous Infusions: . sodium chloride     PRN Meds: sodium chloride, acetaminophen, hydroxypropyl methylcellulose / hypromellose, labetalol, nitroGLYCERIN, ondansetron (ZOFRAN) IV, sodium chloride flush   Vital Signs    Vitals:   03/16/19 1813 03/16/19 2011 03/16/19 2023 03/17/19 0518  BP: (!) 164/53 (!) 158/55  (!) 154/51  Pulse: 66 67 67 64  Resp:   17 18  Temp:  98 F (36.7 C)  98.7 F (37.1 C)  TempSrc:  Oral  Oral  SpO2:  95% 95% 95%  Weight:    92.5 kg  Height:        Intake/Output Summary (Last 24 hours) at 03/17/2019 0820 Last data filed at 03/17/2019 0024 Gross per 24 hour  Intake 440 ml  Output 2100 ml  Net -1660 ml   Last 3 Weights 03/17/2019 03/16/2019 03/11/2019  Weight (lbs) 203 lb 14.4 oz 208 lb 208 lb  Weight (kg) 92.488 kg 94.348 kg 94.348 kg      Telemetry    NSR, HR 50-60s - Personally Reviewed  ECG    No new- Personally Reviewed  Physical Exam   GEN: No acute distress.   Neck: No JVD Cardiac: RRR, no murmurs, rubs, or gallops.  Respiratory: Clear to auscultation  bilaterally. GI: Soft, nontender, non-distended  MS: No edema; No deformity; Right groin stable with no tenderness or hematoma Neuro:  Nonfocal  Psych: Normal affect   Labs    High Sensitivity Troponin:  No results for input(s): TROPONINIHS in the last 720 hours.    Chemistry Recent Labs  Lab 03/11/19 1155 03/17/19 0307  NA 133* 137  K 4.7 4.5  CL 95* 96*  CO2 24 28  GLUCOSE 229* 213*  BUN 33* 15  CREATININE 1.19* 0.80  CALCIUM 9.4 9.9  PROT 7.3  --   ALBUMIN 4.2  --   AST 14  --   ALT 15  --   ALKPHOS 62  --   BILITOT <0.2  --   GFRNONAA 47* >60  GFRAA 55* >60  ANIONGAP  --  13     Hematology Recent Labs  Lab 03/11/19 1155  WBC 10.5  RBC 4.44  HGB 13.5  HCT 41.4  MCV 93  MCH 30.4  MCHC 32.6  RDW 13.9  PLT 279    BNPNo results for input(s): BNP, PROBNP in the last 168 hours.  DDimer No results for input(s): DDIMER in the last 168 hours.   Radiology    PERIPHERAL VASCULAR CATHETERIZATION  Result Date: 03/16/2019 1.  Severe bilateral renal artery stenosis. 2.  Successful angioplasty and stent placement to bilateral renal arteries. Recommendations: Continue dual antiplatelet therapy. Gentle hydration post procedure. Due to stenting of both renal arteries, there is risk of hypotension and thus I elected to observe the patient overnight.  I am going to hold hydralazine and spironolactone for now and continue other antihypertensive medications. Likely discharge home tomorrow morning.   Cardiac Studies   Renal Vascular Catheterization 1.  Severe bilateral renal artery stenosis. 2.  Successful angioplasty and stent placement to bilateral renal arteries.  Recommendations: Continue dual antiplatelet therapy. Gentle hydration post procedure. Due to stenting of both renal arteries, there is risk of hypotension and thus I elected to observe the patient overnight.  I am going to hold hydralazine and spironolactone for now and continue other antihypertensive  medications. Likely discharge home tomorrow morning.  Patient Profile     69 y.o. female with a pmh of PAD s/p left common iliac stent and left common femoral artery resection and bypass in 2015, chronically occluded SFA, CAD s/p DES to LAD x 2 (most recent in 2015 for in-stent restenosis), uncontrolled HTN, chronic diastolic Heart Failure (EF 65-70% G2DD), left carotid stenosis, DM2  Assessment & Plan   Renal Artery stenosis, B/L - Patient with h/o of chronic persistent HTN - Underwent successful angioplasty and stent placement to bilateral renal arteries - Plan to continue DAPT with Aspirin and Plavix - Cath site appears stable - Patient is up and about the room with no issures - Creatinine 0.80 and Hgb 13.5  HTN - Patient's pressure has been difficult to control despite being on multiple therapies therefore she was scheduled to undergo renal artery stenting - Hydralazine and spiro held in case of hypotension - Amlodipine 10 mg, Coreg 12.5 mg BID, Lisinopril 20 mg BID, Lasix 40 mg were continued - Pressures have been elevated - Plan to resume Hydralazine and spiro at discharge  PAD - Most recent vascular studies showed stable ABI 0.5  CAD s/p DES LAD x 2 - Lexiscan Myoview in 2019 showed small apical infarct with mild peri-infarct ischemia and normal EF - No chest pain - Continue medical therapy  HLD - continue Rosuvastatin - LDL 58  Chronic Diastolic HF - Compensated - continue home furosemide  For questions or updates, please contact [facility] Please consult [email or web address] for contact info under        Signed, Zebulin Siegel Ninfa Meeker, PA-C  03/17/2019, 8:20 AM

## 2019-03-17 NOTE — Progress Notes (Signed)
O2 sats down to 78% on room air while sleeping.  Patient lying on her left side and snoring when RN entered the room.  Sats improved to 95% when awakened.  2L/[place] applied.  Patient encouraged to keep oxygen on while sleeping tonight.  Will continue to monitor.  Jodell Cipro

## 2019-03-17 NOTE — Discharge Instructions (Signed)
Femoral Site Care This sheet gives you information about how to care for yourself after your procedure. Your health care provider may also give you more specific instructions. If you have problems or questions, contact your health care provider. What can I expect after the procedure? After the procedure, it is common to have:  Bruising that usually fades within 1-2 weeks.  Tenderness at the site. Follow these instructions at home: Wound care  Follow instructions from your health care provider about how to take care of your insertion site. Make sure you: ? Wash your hands with soap and water before you change your bandage (dressing). If soap and water are not available, use hand sanitizer. ? Change your dressing as told by your health care provider. ? Leave stitches (sutures), skin glue, or adhesive strips in place. These skin closures may need to stay in place for 2 weeks or longer. If adhesive strip edges start to loosen and curl up, you may trim the loose edges. Do not remove adhesive strips completely unless your health care provider tells you to do that.  Do not take baths, swim, or use a hot tub until your health care provider approves.  You may shower 24-48 hours after the procedure or as told by your health care provider. ? Gently wash the site with plain soap and water. ? Pat the area dry with a clean towel. ? Do not rub the site. This may cause bleeding.  Do not apply powder or lotion to the site. Keep the site clean and dry.  Check your femoral site every day for signs of infection. Check for: ? Redness, swelling, or pain. ? Fluid or blood. ? Warmth. ? Pus or a bad smell. Activity  For the first 2-3 days after your procedure, or as long as directed: ? Avoid climbing stairs as much as possible. ? Do not squat.  Do not lift anything that is heavier than 10 lb (4.5 kg), or the limit that you are told, until your health care provider says that it is safe.  Rest as  directed. ? Avoid sitting for a long time without moving. Get up to take short walks every 1-2 hours.  Do not drive for 24 hours if you were given a medicine to help you relax (sedative). General instructions  Take over-the-counter and prescription medicines only as told by your health care provider.  Keep all follow-up visits as told by your health care provider. This is important. Contact a health care provider if you have:  A fever or chills.  You have redness, swelling, or pain around your insertion site. Get help right away if:  The catheter insertion area swells very fast.  You pass out.  You suddenly start to sweat or your skin gets clammy.  The catheter insertion area is bleeding, and the bleeding does not stop when you hold steady pressure on the area.  The area near or just beyond the catheter insertion site becomes pale, cool, tingly, or numb. These symptoms may represent a serious problem that is an emergency. Do not wait to see if the symptoms will go away. Get medical help right away. Call your local emergency services (911 in the [place] Do not drive yourself to the hospital. Summary  After the procedure, it is common to have bruising that usually fades within 1-2 weeks.  Check your femoral site every day for signs of infection.  Do not lift anything that is heavier than 10 lb (4.5 kg), or the   limit that you are told, until your health care provider says that it is safe. This information is not intended to replace advice given to you by your health care provider. Make sure you discuss any questions you have with your health care provider. Document Revised: 02/16/2017 Document Reviewed: 02/16/2017 Elsevier Patient Education  [place].    Diabetes Mellitus and Nutrition, Adult When you have diabetes (diabetes mellitus), it is very important to have healthy eating habits because your blood sugar (glucose) levels are greatly affected by what you eat and  drink. Eating healthy foods in the appropriate amounts, at about the same times every day, can help you:  Control your blood glucose.  Lower your risk of heart disease.  Improve your blood pressure.  Reach or maintain a healthy weight. Every person with diabetes is different, and each person has different needs for a meal plan. Your health care provider may recommend that you work with a diet and nutrition specialist (dietitian) to make a meal plan that is best for you. Your meal plan may vary depending on factors such as:  The calories you need.  The medicines you take.  Your weight.  Your blood glucose, blood pressure, and cholesterol levels.  Your activity level.  Other health conditions you have, such as heart or kidney disease. How do carbohydrates affect me? Carbohydrates, also called carbs, affect your blood glucose level more than any other type of food. Eating carbs naturally raises the amount of glucose in your blood. Carb counting is a method for keeping track of how many carbs you eat. Counting carbs is important to keep your blood glucose at a healthy level, especially if you use insulin or take certain oral diabetes medicines. It is important to know how many carbs you can safely have in each meal. This is different for every person. Your dietitian can help you calculate how many carbs you should have at each meal and for each snack. Foods that contain carbs include:  Bread, cereal, rice, pasta, and crackers.  Potatoes and corn.  Peas, beans, and lentils.  Milk and yogurt.  Fruit and juice.  Desserts, such as cakes, cookies, ice cream, and candy. How does alcohol affect me? Alcohol can cause a sudden decrease in blood glucose (hypoglycemia), especially if you use insulin or take certain oral diabetes medicines. Hypoglycemia can be a life-threatening condition. Symptoms of hypoglycemia (sleepiness, dizziness, and confusion) are similar to symptoms of having too  much alcohol. If your health care provider says that alcohol is safe for you, follow these guidelines:  Limit alcohol intake to no more than 1 drink per day for nonpregnant women and 2 drinks per day for men. One drink equals 12 oz of beer, 5 oz of wine, or 1 oz of hard liquor.  Do not drink on an empty stomach.  Keep yourself hydrated with water, diet soda, or unsweetened iced tea.  Keep in mind that regular soda, juice, and other mixers may contain a lot of sugar and must be counted as carbs. What are tips for following this plan?  Reading food labels  Start by checking the serving size on the "Nutrition Facts" label of packaged foods and drinks. The amount of calories, carbs, fats, and other nutrients listed on the label is based on one serving of the item. Many items contain more than one serving per package.  Check the total grams (g) of carbs in one serving. You can calculate the number of servings of carbs  in one serving by dividing the total carbs by 15. For example, if a food has 30 g of total carbs, it would be equal to 2 servings of carbs.  Check the number of grams (g) of saturated and trans fats in one serving. Choose foods that have low or no amount of these fats.  Check the number of milligrams (mg) of salt (sodium) in one serving. Most people should limit total sodium intake to less than 2,300 mg per day.  Always check the nutrition information of foods labeled as "low-fat" or "nonfat". These foods may be higher in added sugar or refined carbs and should be avoided.  Talk to your dietitian to identify your daily goals for nutrients listed on the label. Shopping  Avoid buying canned, premade, or processed foods. These foods tend to be high in fat, sodium, and added sugar.  Shop around the outside edge of the grocery store. This includes fresh fruits and vegetables, bulk grains, fresh meats, and fresh dairy. Cooking  Use low-heat cooking methods, such as baking, instead  of high-heat cooking methods like deep frying.  Cook using healthy oils, such as olive, canola, or sunflower oil.  Avoid cooking with butter, cream, or high-fat meats. Meal planning  Eat meals and snacks regularly, preferably at the same times every day. Avoid going long periods of time without eating.  Eat foods high in fiber, such as fresh fruits, vegetables, beans, and whole grains. Talk to your dietitian about how many servings of carbs you can eat at each meal.  Eat 4-6 ounces (oz) of lean protein each day, such as lean meat, chicken, fish, eggs, or tofu. One oz of lean protein is equal to: ? 1 oz of meat, chicken, or fish. ? 1 egg. ?  cup of tofu.  Eat some foods each day that contain healthy fats, such as avocado, nuts, seeds, and fish. Lifestyle  Check your blood glucose regularly.  Exercise regularly as told by your health care provider. This may include: ? 150 minutes of moderate-intensity or vigorous-intensity exercise each week. This could be brisk walking, biking, or water aerobics. ? Stretching and doing strength exercises, such as yoga or weightlifting, at least 2 times a week.  Take medicines as told by your health care provider.  Do not use any products that contain nicotine or tobacco, such as cigarettes and e-cigarettes. If you need help quitting, ask your health care provider.  Work with a Social worker or diabetes educator to identify strategies to manage stress and any emotional and social challenges. Questions to ask a health care provider  Do I need to meet with a diabetes educator?  Do I need to meet with a dietitian?  What number can I call if I have questions?  When are the best times to check my blood glucose? Where to find more information:  American Diabetes Association: diabetes.org  Academy of Nutrition and Dietetics: www.eatright.[employer] of Diabetes and Digestive and Kidney Diseases (NIH): [employer] Summary  A  healthy meal plan will help you control your blood glucose and maintain a healthy lifestyle.  Working with a diet and nutrition specialist (dietitian) can help you make a meal plan that is best for you.  Keep in mind that carbohydrates (carbs) and alcohol have immediate effects on your blood glucose levels. It is important to count carbs and to use alcohol carefully. This information is not intended to replace advice given to you by your health care provider. Make sure you discuss  any questions you have with your health care provider. Document Revised: 01/16/2017 Document Reviewed: 03/10/2016 Elsevier Patient Education  2020 [employer].

## 2019-03-17 NOTE — Plan of Care (Signed)
  Problem: Education: Goal: Knowledge of General Education information will improve Description Including pain rating scale, medication(s)/side effects and non-pharmacologic comfort measures Outcome: Progressing   

## 2019-03-18 ENCOUNTER — Telehealth: Payer: Self-pay | Admitting: Cardiovascular Disease

## 2019-03-18 NOTE — Telephone Encounter (Signed)
Patient is calling requesting Kathryne Sharper give her a call. She states it is very important, but would not specify what it is about.

## 2019-03-18 NOTE — Telephone Encounter (Signed)
Called patient to discuss the concerns she may have, patient did not answer, LVM with call back number.

## 2019-03-21 ENCOUNTER — Encounter: Payer: Self-pay | Admitting: Family Medicine

## 2019-03-22 ENCOUNTER — Encounter: Payer: Self-pay | Admitting: Cardiovascular Disease

## 2019-03-22 ENCOUNTER — Ambulatory Visit (INDEPENDENT_AMBULATORY_CARE_PROVIDER_SITE_OTHER): Payer: Medicare Other | Admitting: Cardiovascular Disease

## 2019-03-22 ENCOUNTER — Other Ambulatory Visit: Payer: Self-pay

## 2019-03-22 VITALS — BP 132/42 | HR 71 | Temp 97.0°F | Ht 62.0 in | Wt 204.6 lb

## 2019-03-22 DIAGNOSIS — I739 Peripheral vascular disease, unspecified: Secondary | ICD-10-CM | POA: Diagnosis not present

## 2019-03-22 DIAGNOSIS — E785 Hyperlipidemia, unspecified: Secondary | ICD-10-CM | POA: Diagnosis not present

## 2019-03-22 DIAGNOSIS — I5032 Chronic diastolic (congestive) heart failure: Secondary | ICD-10-CM | POA: Diagnosis not present

## 2019-03-22 DIAGNOSIS — I150 Renovascular hypertension: Secondary | ICD-10-CM | POA: Diagnosis not present

## 2019-03-22 DIAGNOSIS — I2510 Atherosclerotic heart disease of native coronary artery without angina pectoris: Secondary | ICD-10-CM | POA: Diagnosis not present

## 2019-03-22 NOTE — Telephone Encounter (Signed)
Patient had an appointment with Dr. Fletcher [name] on 2/2. All questions were answered.

## 2019-03-22 NOTE — Progress Notes (Signed)
Cardiology Office Note   Date:  03/22/2019   ID:  Sharen, Alleman 1950-05-28, MRN [id number]  PCP:  Sharion Balloon, FNP  Cardiologist: Dr. Fletcher [name]   No chief complaint on file.     History of Present Illness: Leah Ferrell is a 69 y.o. female who presents for a followup visit regarding peripheral arterial disease and coronary artery disease.  She has known history of coronary artery disease status post drug-eluting stent placement to the LAD X 2 Most recently in 2015 for in-stent restenosis.  She is known to have peripheral arterial disease status post left common iliac artery stent placement followed by left common femoral artery resection and bypass from left external iliac to the left profunda done by Dr. Kellie Simmering in 2015. She is known to have chronically occluded left SFA being managed medically.  She had worsening left leg claudication in 2018 with elevated velocity at the proximal anastomosis of the graft.   Angiography in March 2018 showed mild to moderate calcified common iliac artery disease with patent stent in the left common iliac artery. There was severe stenosis in the left common femoral artery at the proximal anastomosis of the bypass to the profunda. I performed successful angioplasty and drug-coated balloon angioplasty.  She was hospitalized in October [id number] for diastolic heart failure.  Echocardiogram showed an EF of 65 to 70% with grade 2 diastolic dysfunction.  Carotid Doppler showed moderate left carotid stenosis.  Lexiscan Myoview in November 2019 showed small distal anterior scar without ischemia with normal ejection fraction.  She had refractory hypertension noted in the last few months with multiple ER visits.  I made multiple adjustments in her medications and referred her for renal artery duplex which showed evidence of bilateral renal artery stenosis. Thus, I proceeded with angiography last month which confirmed severe bilateral renal artery stenosis.  I  performed successful angioplasty and stent placement to both renal arteries with excellent results.  Hydralazine was discontinued after the procedure.  She has been doing significantly better since then with significant improvement in blood pressure.  She denies chest pain or shortness of breath.   Past Medical History:  Diagnosis Date  . CAD (coronary artery disease)    a. 04/2012 NSTEMI: s/p DES to LAD.  Marland Kitchen Chronic diastolic CHF (congestive heart failure) ([place])   . Diabetes mellitus without complication ([place])   . Dysrhythmia   . Environmental and seasonal allergies   . Hyperlipidemia   . Hypertension   . Leukocytosis   . Morbid obesity ([place])   . NSTEMI (non-ST elevated myocardial infarction) ([place]) 04/27/2012  . PONV (postoperative nausea and vomiting)   . PVD (peripheral vascular disease) ([place])    a. 04/2012 ABI: R 0.49, L 0.39. Angiography 06/14: Significant ostial left common iliac artery stenosis extending into the distal aorta, severe left common femoral artery stenosis, bilateral SFA occlusion with heavy calcifications. Functionally, one-vessel runoff bilaterally below the knee  . Shortness of breath     Past Surgical History:  Procedure Laterality Date  . ABDOMINAL AORTAGRAM N/A 07/21/2012   Procedure: ABDOMINAL Maxcine Ham;  Surgeon: Wellington Hampshire, MD;  Location: [facility] CATH LAB;  Service: Cardiovascular;  Laterality: N/A;  . CARDIAC CATHETERIZATION     stents X 2  . CORONARY ANGIOGRAM  04/27/2012   Procedure: CORONARY ANGIOGRAM;  Surgeon: Thayer Headings, MD;  Location: [facility] CATH LAB;  Service: Cardiovascular;;  . ENDARTERECTOMY FEMORAL Left 11/02/2013   Procedure: RESECTION LEFT COMMON FEMORAL ARTERY AND  INSERTION OF INTERPOSITION 13mm HEMASHIELD GRAFT FROM LEFT EXTERNAL  ILIAC ARTERY TO LEFT PROFUNDA  FEMORIS ARTERY;  Surgeon: Mal Misty, MD;  Location: [facility];  Service: Vascular;  Laterality: Left;  . EYE SURGERY Right 2017  . FRACTURE SURGERY Right Jul 07, 2014   Right Foot-   [place]  [facility] Home  by Heat duct  . ILIAC ARTERY STENT Left 08/31/2013  . LAD stent    . LEFT HEART CATHETERIZATION WITH CORONARY ANGIOGRAM N/A 04/23/2012   Procedure: LEFT HEART CATHETERIZATION WITH CORONARY ANGIOGRAM;  Surgeon: Peter M Martinique, MD;  Location: [facility] C[facility] LAB;  Service: Cardiovascular;  Laterality: N/A;  . LOWER EXTREMITY ANGIOGRAM Left 08/31/2013   Procedure: LOWER EXTREMITY ANGIOGRAM;  Surgeon: Wellington Hampshire, MD;  Location: [facility] CATH LAB;  Service: Cardiovascular;  Laterality: Left;  . PERCUTANEOUS STENT INTERVENTION Left 08/31/2013   Procedure: PERCUTANEOUS STENT INTERVENTION;  Surgeon: Wellington Hampshire, MD;  Location: [facility] CATH LAB;  Service: Cardiovascular;  Laterality: Left;  Common Iliac artery  . PERIPHERAL VASCULAR CATHETERIZATION N/A 03/19/2016   Procedure: Abdominal Aortogram w/Lower Extremity;  Surgeon: Wellington Hampshire, MD;  Location: [facility] CV LAB;  Service: Cardiovascular;  Laterality: N/A;  . PERIPHERAL VASCULAR CATHETERIZATION  03/19/2016   Procedure: Peripheral Vascular Balloon Angioplasty-CFA Left;  Surgeon: Wellington Hampshire, MD;  Location: [facility] CV LAB;  Service: Cardiovascular;;  . PERIPHERAL VASCULAR INTERVENTION Bilateral 03/16/2019   Procedure: PERIPHERAL VASCULAR INTERVENTION;  Surgeon: Wellington Hampshire, MD;  Location: [facility] CV LAB;  Service: Cardiovascular;  Laterality: Bilateral;  RENAL  . RENAL ANGIOGRAPHY  03/16/2019  . RENAL ANGIOGRAPHY Bilateral 03/16/2019   Procedure: RENAL ANGIOGRAPHY;  Surgeon: Wellington Hampshire, MD;  Location: [facility] CV LAB;  Service: Cardiovascular;  Laterality: Bilateral;  . 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 acetaminophen (TYLENOL) 325 MG tablet Take 2 tablets (650 mg total) by mouth every 6 (six) hours as needed for mild pain (or Fever >/= 101). 20 tablet 0  . albuterol (PROVENTIL HFA) 108 (90 Base) MCG/ACT inhaler INHALE ONE TO TWO  PUFFS BY  MOUTH EVERY 6 HOURS AS  NEEDED FOR WHEEZING OR FOR  SHORTNESS OF BREATH (Patient taking differently: Inhale 1-2 puffs into the lungs every 6 (six) hours as needed for wheezing or shortness of breath. ) 13.4 g 2  . amLODipine (NORVASC) 10 MG tablet Take 1 tablet (10 mg total) by mouth daily. (Patient taking differently: Take 10 mg by mouth every evening. ) 90 tablet 3  . Ascorbic Acid (VITAMIN C) 1000 MG tablet Take 1,000 mg by mouth 3 (three) times daily.     Marland Kitchen aspirin EC 81 MG tablet Take 81 mg by mouth daily. In the morning    . BD ULTRA-FINE PEN NEEDLES 29G X 12.7MM MISC USE PENNEEDLES 3 TIMES  DAILY AS DIRECTED 270 each 1  . Calcium Carbonate-Vit D-Min (CALTRATE 600+D PLUS MINERALS) 600-800 MG-UNIT TABS Take 1 tablet by mouth 3 (three) times daily.     . carvedilol (COREG) 12.5 MG tablet Take 1 tablet (12.5 mg total) by mouth 2 (two) times daily. 180 tablet 3  . Cholecalciferol (VITAMIN D-3) 125 MCG (5000 UT) TABS Take 5,000 Units by mouth daily.    . clopidogrel (PLAVIX) 75 MG tablet TAKE 1 TABLET BY MOUTH  DAILY (Patient taking differently: Take 75 mg by mouth daily. ) 90 tablet 0  .  fish oil-omega-3 fatty acids 1000 MG capsule Take 1 g by mouth daily.     Marland Kitchen HUMALOG KWIKPEN 200 UNIT/ML SOPN INJECT SUBCUTANEOUSLY 50  UNITS 3 TIMES DAILY BEFORE  MEALS (Patient taking differently: Inject 20-50 Units into the skin 3 (three) times daily. Sliding Scale Insulin) 24 mL 11  . hydroxypropyl methylcellulose (ISOPTO TEARS) 2.5 % ophthalmic solution Place 1 drop into both eyes 3 (three) times daily as needed (for dry eyes).    Marland Kitchen lisinopril (ZESTRIL) 20 MG tablet Take 2 tablets (40 mg total) by mouth daily. (Patient taking differently: Take 20 mg by mouth 2 (two) times daily. ) 180 tablet 1  . montelukast (SINGULAIR) 10 MG tablet Take 1 tablet by mouth once daily with breakfast (Patient taking differently: Take 10 mg by mouth daily. ) 30 tablet 4  . nitroGLYCERIN (NITROSTAT) 0.4 MG SL tablet Place 1  tablet (0.4 mg total) under the tongue every 5 (five) minutes x 3 doses as needed for chest pain (up to 3 doses). 25 tablet 4  . RELION PEN NEEDLES 29G X 12MM MISC USE 1 NEEDLE TO INJECT INSULIN SUBCUTANEOUSLY 3 TIMES DAILY (Patient taking differently: 1 Device by Subconjunctival route 3 (three) times daily. ) 50 each 5  . rosuvastatin (CRESTOR) 40 MG tablet TAKE 1 TABLET BY MOUTH  DAILY (Patient taking differently: Take 40 mg by mouth every evening. ) 90 tablet 0  . spironolactone (ALDACTONE) 25 MG tablet Take 1 tablet (25 mg total) by mouth daily. 90 tablet 3   No current facility-administered medications for this visit.    Allergies:   Tape    Social History:  The patient  reports that she quit smoking about 18 years ago. Her smoking use included cigarettes. She started smoking about 54 years ago. She has a 36.00 pack-year smoking history. She has never used smokeless tobacco. She reports that she does not drink alcohol or use drugs.   Family History:  The patient's family history includes Alcohol abuse in her brother; Diabetes in her brother, daughter, maternal grandmother, mother, and son; Heart attack in her maternal grandfather; Heart disease in her brother; Hyperlipidemia in her mother and son; Hypertension in her daughter, mother, and son; Other in her sister and sister; Peripheral vascular disease in her mother.    ROS:  Please see the history of present illness.   Otherwise, review of systems are positive for none.   All other systems are reviewed and negative.    PHYSICAL EXAM: VS:  BP (!) 132/42   Pulse 71   Temp (!) 97 F (36.1 C)   Ht 5\' 2"  (1.575 m)   Wt 204 lb 9.6 oz (92.8 kg)   BMI 37.42 kg/m  , BMI Body mass index is 37.42 kg/m. GEN: Well nourished, well developed, in no acute distress  HEENT: normal  Neck: no JVD, carotid bruits, or masses Cardiac: RRR; no  rubs, or gallops , trace edema . There is 2/6 systolic ejection murmur in the aortic area Respiratory:   clear to auscultation bilaterally, normal work of breathing GI: soft, nontender, nondistended, + BS MS: no deformity or atrophy  Skin: warm and dry, no rash Neuro:  Strength and sensation are intact Psych: euthymic mood, full affect Vascular: Femoral pulses : +1 on the right with no groin hematoma  EKG:  EKG is   ordered today. EKG showed sinus rhythm with no significant ST or T wave changes.   Recent Labs: 12/07/2018: TSH 1.470 03/11/2019: ALT 15; Hemoglobin  13.5; Platelets 279 03/17/2019: BUN 15; Creatinine, Ser 0.80; Potassium 4.5; Sodium 137    Lipid Panel    Component Value Date/Time   CHOL 141 09/02/2018 1157   TRIG 86 09/02/2018 1157   TRIG 135 09/16/2012 1322   HDL 66 09/02/2018 1157   HDL 60 09/16/2012 1322   CHOLHDL 2.1 09/02/2018 1157   CHOLHDL 3.2 05/06/2012 1713   VLDL 32 05/06/2012 1713   LDLCALC 58 09/02/2018 1157   LDLCALC 92 09/16/2012 1322      Wt Readings from Last 3 Encounters:  03/22/19 204 lb 9.6 oz (92.8 kg)  03/17/19 203 lb 14.4 oz (92.5 kg)  03/11/19 208 lb (94.3 kg)       No flowsheet data found.    ASSESSMENT AND PLAN:  1. . Renovascular hypertension: Significant improvement in blood pressure status post bilateral renal artery stenting for severe bilateral stenosis.  Hydralazine was discontinued after the procedure and her blood pressure looks the best it has been a long time.  I do suspect that her heart failure was mostly driven by bilateral renal artery stenosis and thus I elected to discontinue furosemide and potassium today.  I requested a follow-up renal artery duplex.  The plan is to try to decrease some of her antihypertensive medications gradually.  She is already on dual antiplatelet therapy.  2. Peripheral arterial disease: Stable bilateral leg claudication.  Most recent vascular studies in September showed stable ABI of 0.5 range.  3. Coronary artery disease involving native coronary arteries without angina: Lexiscan Myoview in  November 2019 showed small apical infarct with only mild peri-infarct ischemia and normal ejection fraction.  Continue medical therapy.   4. Hyperlipidemia: The dose of rosuvastatin was increased to 40 mg once daily.  Most recent lipid profile showed an LDL of 63.  5.  Chronic diastolic heart failure: Most likely in the setting of uncontrolled hypertension and severe bilateral renal artery stenosis.   Disposition:   FU with me in 2  months  Signed,  [name], MD  03/22/2019 9:30 AM    [place]

## 2019-03-22 NOTE — Patient Instructions (Signed)
Medication Instructions:  STOP the Furosemide STOP the Potassium  *If you need a refill on your cardiac medications before your next appointment, please call your pharmacy*  Lab Work: None ordered If you have labs (blood work) drawn today and your tests are completely normal, you will receive your results only by: Marland Kitchen MyChart Message (if you have MyChart) OR . A paper copy in the mail If you have any lab test that is abnormal or we need to change your treatment, we will call you to review the results.  Testing/Procedures: None ordered  Follow-Up: At [facility], you and your health needs are our priority.  As part of our continuing mission to provide you with exceptional heart care, we have created designated Provider Care Teams.  These Care Teams include your primary Cardiologist (physician) and Advanced Practice Providers (APPs -  Physician Assistants and Nurse Practitioners) who all work together to provide you with the care you need, when you need it.  Your next appointment:   2 month(s)  The format for your next appointment:   In Person  Provider:   [name], MD

## 2019-03-24 ENCOUNTER — Ambulatory Visit: Payer: Medicare Other | Admitting: Family

## 2019-03-24 ENCOUNTER — Ambulatory Visit (HOSPITAL_COMMUNITY): Payer: 59

## 2019-03-24 ENCOUNTER — Other Ambulatory Visit: Payer: Self-pay | Admitting: Cardiovascular Disease

## 2019-03-24 DIAGNOSIS — I150 Renovascular hypertension: Secondary | ICD-10-CM

## 2019-03-24 DIAGNOSIS — I701 Atherosclerosis of renal artery: Secondary | ICD-10-CM

## 2019-03-28 ENCOUNTER — Telehealth: Payer: Self-pay | Admitting: Cardiovascular Disease

## 2019-03-28 DIAGNOSIS — E1159 Type 2 diabetes mellitus with other circulatory complications: Secondary | ICD-10-CM

## 2019-03-28 DIAGNOSIS — I152 Hypertension secondary to endocrine disorders: Secondary | ICD-10-CM

## 2019-03-28 MED ORDER — AMLODIPINE BESYLATE 5 MG PO TABS: 5.0000 mg | ORAL_TABLET | Freq: Every day | ORAL | 2 refills | Status: DC

## 2019-03-28 MED ORDER — CARVEDILOL 6.25 MG PO TABS: 6.2500 mg | ORAL_TABLET | Freq: Two times a day (BID) | ORAL | 2 refills | Status: DC

## 2019-03-28 NOTE — Telephone Encounter (Signed)
The patient has been called and verbalized her understanding.  She will keep track of her blood pressure and call back if needed.

## 2019-03-28 NOTE — Telephone Encounter (Signed)
The patient called in stating that she has been feeling light headed and dizzy. Her blood pressure last night was 110/42 and heart rate was 60. This was one hour after taking her Amlodipine 10 mg, Carvedilol 12.5 mg and Lisinopril 20 mg.  This morning her blood pressure and heart rate was 130/55 and 67. This was one hour after the lisinopril 20 mg and spironolactone 25 mg. She held her Carvedilol this morning.   Morning Lisinopril 20 mg Carvedilol 12.5 mg Spironolactone 25 mg   Evening Amlodipine 10 mg Carvedilol 12.5 mg Lisinopril 20 mg  She would like to know if any of her medications need to be discontinued or decreased. She has been advised to continue to take her blood pressure and keep a log of these readings.

## 2019-03-28 NOTE — Telephone Encounter (Signed)
Decrease amlodipine to 5 mg once daily and decrease carvedilol to 6.25 mg twice daily

## 2019-03-28 NOTE — Telephone Encounter (Signed)
New Message  Pt would like to speak with Leah Ferrell. Would not give details.  She is a pt of Dr. Fletcher [name]  Please call back

## 2019-04-12 ENCOUNTER — Ambulatory Visit: Payer: 59 | Admitting: Cardiovascular Disease

## 2019-04-13 ENCOUNTER — Telehealth: Payer: Self-pay | Admitting: Cardiovascular Disease

## 2019-04-13 NOTE — Telephone Encounter (Signed)
I agree with your assessment.  There is no way for the plaque to go up to the brain given that the work was done on the renal arteries.  If the plaque breaks off during that kind of procedure, it goes down to the foot.

## 2019-04-13 NOTE — Telephone Encounter (Signed)
Patient is requesting Dr. Tyrell Antonio nurse, [name], to give her a call in regards to a reaction she is having. She is not sure if it is from a medication. She states the roof of her mouth feels numb and tingly and so does her tongue. She kept requesting to speak with [name] and would not discuss further.

## 2019-04-13 NOTE — Telephone Encounter (Signed)
The patient has been aware of Dr. Tyrell Antonio advice. She has been advised to keep her log of her food that may be causing those symptoms. If the symptoms get worse and spread to her face or arms, she develops slurred speech, she has been advised to seek medical help.

## 2019-04-13 NOTE — Telephone Encounter (Signed)
Returned the call to the patient. She stated that for the past couple of days, after she eats, she will experience a tingling in her lip, the roof of her mouth on the left side goes numb, and sometimes the back of her tongue may go numb. This has only happened once a day for the past two days and it never lasts longer than 5 minutes. She denies any other symptoms.  She has had no change in her diet except for more canned soups, sardines in the can, and weenies in the can. She has been advised to cut back on the canned foods due to the high sodium content.   Her blood pressure this morning was 120/53 and heart rate was 83.  She would like for Dr. Fletcher [name] to know since she is concerned that a piece of plague could have broken off and traveled to her brain.   She has been advised that it sounds like it could be a reaction to food since it happens after she eats. She has been advised to keep a log of the food she eats when these symptoms occur.   She would still like to have Dr. Tyrell Antonio opinion on the possibility of the plaque.

## 2019-04-26 ENCOUNTER — Other Ambulatory Visit: Payer: Self-pay | Admitting: *Deleted

## 2019-04-26 MED ORDER — HUMALOG KWIKPEN 200 UNIT/ML ~~LOC~~ SOPN: PEN_INJECTOR | SUBCUTANEOUS | 2 refills | Status: DC

## 2019-04-28 ENCOUNTER — Other Ambulatory Visit: Payer: Self-pay

## 2019-04-28 ENCOUNTER — Ambulatory Visit (INDEPENDENT_AMBULATORY_CARE_PROVIDER_SITE_OTHER): Payer: Medicare Other

## 2019-04-28 DIAGNOSIS — I701 Atherosclerosis of renal artery: Secondary | ICD-10-CM | POA: Diagnosis not present

## 2019-04-28 DIAGNOSIS — I150 Renovascular hypertension: Secondary | ICD-10-CM

## 2019-05-24 ENCOUNTER — Ambulatory Visit: Payer: 59 | Admitting: Cardiovascular Disease

## 2019-05-31 ENCOUNTER — Ambulatory Visit: Payer: 59 | Admitting: Cardiovascular Disease

## 2019-06-03 ENCOUNTER — Telehealth: Payer: Self-pay | Admitting: Family

## 2019-06-03 ENCOUNTER — Other Ambulatory Visit: Payer: Self-pay

## 2019-06-03 DIAGNOSIS — I152 Hypertension secondary to endocrine disorders: Secondary | ICD-10-CM

## 2019-06-03 DIAGNOSIS — E1159 Type 2 diabetes mellitus with other circulatory complications: Secondary | ICD-10-CM

## 2019-06-03 MED ORDER — LISINOPRIL 20 MG PO TABS: 20.0000 mg | ORAL_TABLET | Freq: Two times a day (BID) | ORAL | 0 refills | Status: DC

## 2019-06-03 NOTE — Telephone Encounter (Signed)
Rx sent- patient aware.  

## 2019-06-03 NOTE — Telephone Encounter (Signed)
  Prescription Request  06/03/2019  What is the name of the medication or equipment? Lisinopril  Have you contacted your pharmacy to request a refill? (if applicable) Yes  Which pharmacy would you like this sent to? [facility],[place]  Pt has appt on 06/09/19. Needs enough of the Rx sent to pharmacy to last her until she comes in for her appt with [facility].   Patient notified that their request is being sent to the clinical staff for review and that they should receive a response within 2 business days.

## 2019-06-03 NOTE — Telephone Encounter (Signed)
error 

## 2019-06-09 ENCOUNTER — Ambulatory Visit (INDEPENDENT_AMBULATORY_CARE_PROVIDER_SITE_OTHER): Payer: Medicare Other | Admitting: Family

## 2019-06-09 ENCOUNTER — Encounter: Payer: Self-pay | Admitting: Family

## 2019-06-09 ENCOUNTER — Other Ambulatory Visit: Payer: Self-pay

## 2019-06-09 VITALS — BP 147/58 | HR 67 | Temp 97.2°F | Ht 62.0 in | Wt 204.6 lb

## 2019-06-09 DIAGNOSIS — I2510 Atherosclerotic heart disease of native coronary artery without angina pectoris: Secondary | ICD-10-CM

## 2019-06-09 DIAGNOSIS — I152 Hypertension secondary to endocrine disorders: Secondary | ICD-10-CM

## 2019-06-09 DIAGNOSIS — I5032 Chronic diastolic (congestive) heart failure: Secondary | ICD-10-CM | POA: Diagnosis not present

## 2019-06-09 DIAGNOSIS — I5033 Acute on chronic diastolic (congestive) heart failure: Secondary | ICD-10-CM | POA: Diagnosis not present

## 2019-06-09 DIAGNOSIS — E785 Hyperlipidemia, unspecified: Secondary | ICD-10-CM

## 2019-06-09 DIAGNOSIS — E139 Other specified diabetes mellitus without complications: Secondary | ICD-10-CM

## 2019-06-09 DIAGNOSIS — Z955 Presence of coronary angioplasty implant and graft: Secondary | ICD-10-CM

## 2019-06-09 DIAGNOSIS — E1159 Type 2 diabetes mellitus with other circulatory complications: Secondary | ICD-10-CM | POA: Diagnosis not present

## 2019-06-09 DIAGNOSIS — I252 Old myocardial infarction: Secondary | ICD-10-CM

## 2019-06-09 DIAGNOSIS — I10 Essential (primary) hypertension: Secondary | ICD-10-CM

## 2019-06-09 DIAGNOSIS — E1169 Type 2 diabetes mellitus with other specified complication: Secondary | ICD-10-CM

## 2019-06-09 DIAGNOSIS — E1151 Type 2 diabetes mellitus with diabetic peripheral angiopathy without gangrene: Secondary | ICD-10-CM

## 2019-06-09 DIAGNOSIS — I739 Peripheral vascular disease, unspecified: Secondary | ICD-10-CM

## 2019-06-09 LAB — BAYER DCA HB A1C WAIVED: HB A1C (BAYER DCA - WAIVED): 8.6 % — ABNORMAL HIGH (ref <7.0)

## 2019-06-09 NOTE — Progress Notes (Signed)
Subjective:    Patient ID: Leah Ferrell, female    DOB: 05-17-1950, 69 y.o.   MRN: [id number]  Chief Complaint  Patient presents with   Medical Management of Chronic Issues   Pt presents to the office today for chronic follow up. Pt has CHF, CAD and hx MI and is followed by Cardiologists every 6 months. PT is followed by Vascular for PVD and PAD. PT states these are stable.   Pt had a renal angiography on 03/16/19 and reports feeling much better! She reports her swelling is improved and her BP is improved.  Diabetes She presents for her follow-up diabetic visit. She has type 2 diabetes mellitus. Her disease course has been stable. There are no hypoglycemic associated symptoms. Associated symptoms include foot paresthesias. Pertinent negatives for diabetes include no blurred vision. There are no hypoglycemic complications. Symptoms are stable. Diabetic complications include heart disease and peripheral neuropathy. Risk factors for coronary artery disease include diabetes mellitus, dyslipidemia, hypertension and sedentary lifestyle. She is following a generally unhealthy diet. Her overall blood glucose range is 180-200 mg/dl.  Hypertension This is a chronic problem. The current episode started more than 1 year ago. The problem has been waxing and waning since onset. Associated symptoms include malaise/fatigue, peripheral edema and shortness of breath. Pertinent negatives include no blurred vision. Risk factors for coronary artery disease include dyslipidemia, diabetes mellitus, obesity and sedentary lifestyle. The current treatment provides moderate improvement. Hypertensive end-organ damage includes CAD/MI and heart failure.  Hyperlipidemia This is a chronic problem. The current episode started more than 1 year ago. The problem is uncontrolled. Recent lipid tests were reviewed and are high. Exacerbating diseases include obesity. Associated symptoms include shortness of breath. Current  antihyperlipidemic treatment includes statins. The current treatment provides moderate improvement of lipids. Risk factors for coronary artery disease include dyslipidemia, diabetes mellitus, hypertension, a sedentary lifestyle and post-menopausal.      Review of Systems  Constitutional: Positive for malaise/fatigue.  Eyes: Negative for blurred vision.  Respiratory: Positive for shortness of breath.   All other systems reviewed and are negative.      Objective:   Physical Exam Vitals reviewed.  Constitutional:      General: She is not in acute distress.    Appearance: She is well-developed. She is obese.  HENT:     Head: Normocephalic and atraumatic.     Right Ear: Tympanic membrane normal.     Left Ear: Tympanic membrane normal.  Eyes:     Pupils: Pupils are equal, round, and reactive to light.  Neck:     Thyroid: No thyromegaly.  Cardiovascular:     Rate and Rhythm: Normal rate and regular rhythm.     Heart sounds: Normal heart sounds. No murmur.  Pulmonary:     Effort: Pulmonary effort is normal. No respiratory distress.     Breath sounds: Normal breath sounds. No wheezing.  Abdominal:     General: Bowel sounds are normal. There is no distension.     Palpations: Abdomen is soft.     Tenderness: There is no abdominal tenderness.  Musculoskeletal:        General: No tenderness. Normal range of motion.     Cervical back: Normal range of motion and neck supple.  Skin:    General: Skin is warm and dry.  Neurological:     Mental Status: She is alert and oriented to person, place, and time.     Cranial Nerves: No cranial nerve deficit.  Deep Tendon Reflexes: Reflexes are normal and symmetric.  Psychiatric:        Behavior: Behavior normal.        Thought Content: Thought content normal.        Judgment: Judgment normal.       BP (!) 147/58    Pulse 67    Temp (!) 97.2 F (36.2 C) (Temporal)    Ht 5' 2" (1.575 m)    Wt 204 lb 9.6 oz (92.8 kg)    SpO2 100%    BMI  37.42 kg/m      Assessment & Plan:  Leah Ferrell comes in today with chief complaint of Medical Management of Chronic Issues   Diagnosis and orders addressed:  1. Acute on chronic diastolic CHF (congestive heart failure) (HCC) - CMP14+EGFR - CBC with Differential/Platelet  2. Acute on chronic diastolic congestive heart failure (HCC) - CMP14+EGFR - CBC with Differential/Platelet  3. Coronary artery disease involving native coronary artery of native heart without angina pectoris - CMP14+EGFR - CBC with Differential/Platelet  4. Chronic diastolic heart failure (HCC) - CMP14+EGFR - CBC with Differential/Platelet  5. Diabetes mellitus with peripheral vascular disease ([place]) - Bayer DCA Hb A1c Waived - CMP14+EGFR - CBC with Differential/Platelet  6. Hypertension associated with diabetes ([place]) - CMP14+EGFR - CBC with Differential/Platelet  7. PAD (peripheral artery disease) (HCC) - CMP14+EGFR - CBC with Differential/Platelet  8. PVD (peripheral vascular disease) (HCC) - CMP14+EGFR - CBC with Differential/Platelet  9. Hyperlipidemia associated with type 2 diabetes mellitus (HCC) - CMP14+EGFR - CBC with Differential/Platelet  10. Diabetes 1.5, managed as type 2 ([place]) - CMP14+EGFR - CBC with Differential/Platelet  11. Hx of non-ST elevation myocardial infarction (NSTEMI)  - CMP14+EGFR - CBC with Differential/Platelet  12. Morbid obesity ([place]) - CMP14+EGFR - CBC with Differential/Platelet  13. S/P primary angioplasty with coronary stent - CMP14+EGFR - CBC with Differential/Platelet   Labs pending Health Maintenance reviewed Diet and exercise encouraged  Follow up plan: 3 months   Evelina Dun, FNP

## 2019-06-09 NOTE — Patient Instructions (Signed)

## 2019-06-10 LAB — CBC WITH DIFFERENTIAL/PLATELET
Basophils Absolute: 0.1 10*3/uL (ref 0.0–0.2)
Basos: 0 %
EOS (ABSOLUTE): 0.2 10*3/uL (ref 0.0–0.4)
Eos: 2 %
Hematocrit: 43.3 % (ref 34.0–46.6)
Hemoglobin: 14 g/dL (ref 11.1–15.9)
Immature Grans (Abs): 0 10*3/uL (ref 0.0–0.1)
Immature Granulocytes: 0 %
Lymphocytes Absolute: 3.4 10*3/uL — ABNORMAL HIGH (ref 0.7–3.1)
Lymphs: 27 %
MCH: 31.3 pg (ref 26.6–33.0)
MCHC: 32.3 g/dL (ref 31.5–35.7)
MCV: 97 fL (ref 79–97)
Monocytes Absolute: 0.6 10*3/uL (ref 0.1–0.9)
Monocytes: 5 %
Neutrophils Absolute: 8.3 10*3/uL — ABNORMAL HIGH (ref 1.4–7.0)
Neutrophils: 66 %
Platelets: 259 10*3/uL (ref 150–450)
RBC: 4.48 x10E6/uL (ref 3.77–5.28)
RDW: 13.5 % (ref 11.7–15.4)
WBC: 12.6 10*3/uL — ABNORMAL HIGH (ref 3.4–10.8)

## 2019-06-10 LAB — CMP14+EGFR
ALT: 15 IU/L (ref 0–32)
AST: 13 IU/L (ref 0–40)
Albumin/Globulin Ratio: 1.3 (ref 1.2–2.2)
Albumin: 4 g/dL (ref 3.8–4.8)
Alkaline Phosphatase: 59 IU/L (ref 39–117)
BUN/Creatinine Ratio: 15 (ref 12–28)
BUN: 11 mg/dL (ref 8–27)
Bilirubin Total: 0.3 mg/dL (ref 0.0–1.2)
CO2: 26 mmol/L (ref 20–29)
Calcium: 9.5 mg/dL (ref 8.7–10.3)
Chloride: 95 mmol/L — ABNORMAL LOW (ref 96–106)
Creatinine, Ser: 0.74 mg/dL (ref 0.57–1.00)
GFR calc Af Amer: 96 mL/min/{1.73_m2} (ref >59)
GFR calc non Af Amer: 84 mL/min/{1.73_m2} (ref >59)
Globulin, Total: 3.2 g/dL (ref 1.5–4.5)
Glucose: 285 mg/dL — ABNORMAL HIGH (ref 65–99)
Potassium: 4.6 mmol/L (ref 3.5–5.2)
Sodium: 136 mmol/L (ref 134–144)
Total Protein: 7.2 g/dL (ref 6.0–8.5)

## 2019-06-13 ENCOUNTER — Other Ambulatory Visit: Payer: Self-pay | Admitting: Family

## 2019-06-14 ENCOUNTER — Encounter: Payer: Self-pay | Admitting: Cardiovascular Disease

## 2019-06-14 ENCOUNTER — Other Ambulatory Visit: Payer: Self-pay

## 2019-06-14 ENCOUNTER — Ambulatory Visit (INDEPENDENT_AMBULATORY_CARE_PROVIDER_SITE_OTHER): Payer: 59 | Admitting: Cardiovascular Disease

## 2019-06-14 VITALS — BP 158/68 | HR 72 | Ht 62.0 in | Wt 207.0 lb

## 2019-06-14 DIAGNOSIS — I5032 Chronic diastolic (congestive) heart failure: Secondary | ICD-10-CM | POA: Diagnosis not present

## 2019-06-14 DIAGNOSIS — I150 Renovascular hypertension: Secondary | ICD-10-CM | POA: Diagnosis not present

## 2019-06-14 DIAGNOSIS — E785 Hyperlipidemia, unspecified: Secondary | ICD-10-CM | POA: Diagnosis not present

## 2019-06-14 DIAGNOSIS — I2510 Atherosclerotic heart disease of native coronary artery without angina pectoris: Secondary | ICD-10-CM

## 2019-06-14 DIAGNOSIS — I739 Peripheral vascular disease, unspecified: Secondary | ICD-10-CM

## 2019-06-14 NOTE — Progress Notes (Signed)
Cardiology Office Note   Date:  06/14/2019   ID:  Novi, Beld [birth year], MRN [id number]  PCP:  Sharion Balloon, FNP  Cardiologist: Dr. Fletcher [name]   No chief complaint on file.     History of Present Illness: Leah Ferrell is a 69 y.o. female who presents for a followup visit regarding peripheral arterial disease and coronary artery disease.  She has known history of coronary artery disease status post drug-eluting stent placement to the LAD X 2 Most recently in 2015 for in-stent restenosis.  She is known to have peripheral arterial disease status post left common iliac artery stent placement followed by left common femoral artery resection and bypass from left external iliac to the left profunda done by Dr. Kellie Simmering in 2015. She is known to have chronically occluded left SFA being managed medically.  She had worsening left leg claudication in 2018 with elevated velocity at the proximal anastomosis of the graft.   Angiography in March 2018 showed mild to moderate calcified common iliac artery disease with patent stent in the left common iliac artery. There was severe stenosis in the left common femoral artery at the proximal anastomosis of the bypass to the profunda. I performed successful angioplasty and drug-coated balloon angioplasty.  She is status post bilateral renal artery stenting in January 2021 due to refractory hypertension and recurrent heart failure.  She had significant improvement in blood pressure control as well as heart failure symptoms since then.  She is doing well with no chest pain, shortness of breath or palpitations.   Past Medical History:  Diagnosis Date  . CAD (coronary artery disease)    a. 04/2012 NSTEMI: s/p DES to LAD.  Marland Kitchen Chronic diastolic CHF (congestive heart failure) ([place])   . Diabetes mellitus without complication ([place])   . Dysrhythmia   . Environmental and seasonal allergies   . Hyperlipidemia   . Hypertension   . Leukocytosis   .  Morbid obesity ([place])   . NSTEMI (non-ST elevated myocardial infarction) ([place]) 04/27/2012  . PONV (postoperative nausea and vomiting)   . PVD (peripheral vascular disease) ([place])    a. 04/2012 ABI: R 0.49, L 0.39. Angiography 06/14: Significant ostial left common iliac artery stenosis extending into the distal aorta, severe left common femoral artery stenosis, bilateral SFA occlusion with heavy calcifications. Functionally, one-vessel runoff bilaterally below the knee  . Shortness of breath     Past Surgical History:  Procedure Laterality Date  . ABDOMINAL AORTAGRAM N/A 07/21/2012   Procedure: ABDOMINAL Maxcine Ham;  Surgeon: Wellington Hampshire, MD;  Location: [facility] CATH LAB;  Service: Cardiovascular;  Laterality: N/A;  . CARDIAC CATHETERIZATION     stents X 2  . CORONARY ANGIOGRAM  04/27/2012   Procedure: CORONARY ANGIOGRAM;  Surgeon: Thayer Headings, MD;  Location: [facility] CATH LAB;  Service: Cardiovascular;;  . ENDARTERECTOMY FEMORAL Left 11/02/2013   Procedure: RESECTION LEFT COMMON FEMORAL ARTERY AND INSERTION OF INTERPOSITION 82mm HEMASHIELD GRAFT FROM LEFT EXTERNAL  ILIAC ARTERY TO LEFT PROFUNDA  FEMORIS ARTERY;  Surgeon: Mal Misty, MD;  Location: [place];  Service: Vascular;  Laterality: Left;  . EYE SURGERY Right 2017  . FRACTURE SURGERY Right Jul 07, 2014   Right Foot-  Pt.fell  at Home  by Heat duct  . ILIAC ARTERY STENT Left 08/31/2013  . LAD stent    . LEFT HEART CATHETERIZATION WITH CORONARY ANGIOGRAM N/A 04/23/2012   Procedure: LEFT HEART CATHETERIZATION WITH CORONARY ANGIOGRAM;  Surgeon: Ander Slade  Martinique, MD;  Location: [facility] CATH LAB;  Service: Cardiovascular;  Laterality: N/A;  . LOWER EXTREMITY ANGIOGRAM Left 08/31/2013   Procedure: LOWER EXTREMITY ANGIOGRAM;  Surgeon: Wellington Hampshire, MD;  Location: [place] [facility] CATH LAB;  Service: Cardiovascular;  Laterality: Left;  . PERCUTANEOUS STENT INTERVENTION Left 08/31/2013   Procedure: PERCUTANEOUS STENT INTERVENTION;  Surgeon: Wellington Hampshire, MD;  Location:  [place] CATH LAB;  Service: Cardiovascular;  Laterality: Left;  Common Iliac artery  . PERIPHERAL VASCULAR CATHETERIZATION N/A 03/19/2016   Procedure: Abdominal Aortogram w/Lower Extremity;  Surgeon: Wellington Hampshire, MD;  Location: [place] CV LAB;  Service: Cardiovascular;  Laterality: N/A;  . PERIPHERAL VASCULAR CATHETERIZATION  03/19/2016   Procedure: Peripheral Vascular Balloon Angioplasty-CFA Left;  Surgeon: Wellington Hampshire, MD;  Location: [place] CV LAB;  Service: Cardiovascular;;  . PERIPHERAL VASCULAR INTERVENTION Bilateral 03/16/2019   Procedure: PERIPHERAL VASCULAR INTERVENTION;  Surgeon: Wellington Hampshire, MD;  Location: [place] CV LAB;  Service: Cardiovascular;  Laterality: Bilateral;  RENAL  . RENAL ANGIOGRAPHY  03/16/2019  . RENAL ANGIOGRAPHY Bilateral 03/16/2019   Procedure: RENAL ANGIOGRAPHY;  Surgeon: Wellington Hampshire, MD;  Location: [place] [facility] CV LAB;  Service: Cardiovascular;  Laterality: Bilateral;  . 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 acetaminophen (TYLENOL) 325 MG tablet Take 2 tablets (650 mg total) by mouth every 6 (six) hours as needed for mild pain (or Fever >/= 101). 20 tablet 0  . albuterol (PROVENTIL HFA) 108 (90 Base) MCG/ACT inhaler INHALE ONE TO TWO PUFFS BY  MOUTH EVERY 6 HOURS AS  NEEDED FOR WHEEZING OR FOR  SHORTNESS OF BREATH (Patient taking differently: 1-2 puffs. INHALE ONE TO TWO PUFFS BY  MOUTH EVERY 6 HOURS AS  NEEDED FOR WHEEZING OR FOR  SHORTNESS OF BREATH) 13.4 g 2  . amLODipine (NORVASC) 5 MG tablet Take 1 tablet (5 mg total) by mouth daily. 30 tablet 2  . Ascorbic Acid (VITAMIN C) 1000 MG tablet Take 1,000 mg by mouth 3 (three) times daily.     Marland Kitchen aspirin EC 81 MG tablet Take 81 mg by mouth daily. In the morning    . BD ULTRA-FINE PEN NEEDLES 29G X 12.7MM MISC USE PENNEEDLES 3 TIMES  DAILY AS DIRECTED 270 each 1  . Calcium Carbonate-Vit D-Min (CALTRATE 600+D PLUS  MINERALS) 600-800 MG-UNIT TABS Take 1 tablet by mouth 3 (three) times daily.     . carvedilol (COREG) 6.25 MG tablet Take 1 tablet (6.25 mg total) by mouth 2 (two) times daily. 60 tablet 2  . Cholecalciferol (VITAMIN D-3) 125 MCG (5000 UT) TABS Take 5,000 Units by mouth daily.    . clopidogrel (PLAVIX) 75 MG tablet TAKE 1 TABLET BY MOUTH  DAILY (Patient taking differently: Take 75 mg by mouth daily. ) 90 tablet 0  . fish oil-omega-3 fatty acids 1000 MG capsule Take 1 g by mouth daily.     . hydroxypropyl methylcellulose (ISOPTO TEARS) 2.5 % ophthalmic solution Place 1 drop into both eyes 3 (three) times daily as needed (for dry eyes).    . insulin lispro (HUMALOG KWIKPEN) 200 UNIT/ML KwikPen INJECT SUBCUTANEOUSLY 50 UNITS 3 TIMES DAILY BEFORE MEALS 24 mL 2  . lisinopril (ZESTRIL) 20 MG tablet Take 1 tablet (20 mg total) by mouth 2 (two) times daily. 60 tablet 0  . montelukast (SINGULAIR) 10 MG tablet Take 1 tablet by mouth once daily with breakfast  30 tablet 4  . nitroGLYCERIN (NITROSTAT) 0.4 MG SL tablet Place 1 tablet (0.4 mg total) under the tongue every 5 (five) minutes x 3 doses as needed for chest pain (up to 3 doses). 25 tablet 4  . RELION PEN NEEDLES 29G X 12MM MISC USE 1 NEEDLE TO INJECT INSULIN SUBCUTANEOUSLY 3 TIMES DAILY (Patient taking differently: 1 Device by Subconjunctival route 3 (three) times daily. ) 50 each 5  . rosuvastatin (CRESTOR) 40 MG tablet TAKE 1 TABLET BY MOUTH  DAILY 90 tablet 0  . spironolactone (ALDACTONE) 25 MG tablet Take 1 tablet (25 mg total) by mouth daily. 90 tablet 3   No current facility-administered medications for this visit.    Allergies:   Tape    Social History:  The patient  reports that she quit smoking about 18 years ago. Her smoking use included cigarettes. She started smoking about 55 years ago. She has a 36.00 pack-year smoking history. She has never used smokeless tobacco. She reports that she does not drink alcohol or use drugs.   Family  History:  The patient's family history includes Alcohol abuse in her brother; Diabetes in her brother, daughter, maternal grandmother, mother, and son; Heart attack in her maternal grandfather; Heart disease in her brother; Hyperlipidemia in her mother and son; Hypertension in her daughter, mother, and son; Other in her sister and sister; Peripheral vascular disease in her mother.    ROS:  Please see the history of present illness.   Otherwise, review of systems are positive for none.   All other systems are reviewed and negative.    PHYSICAL EXAM: VS:  BP (!) 158/68   Pulse 72   Ht 5\' 2"  (1.575 m)   Wt 207 lb (93.9 kg)   SpO2 96%   BMI 37.86 kg/m  , BMI Body mass index is 37.86 kg/m. GEN: Well nourished, well developed, in no acute distress  HEENT: normal  Neck: no JVD, carotid bruits, or masses Cardiac: RRR; no  rubs, or gallops , trace edema . There is 2/6 systolic ejection murmur in the aortic area Respiratory:  clear to auscultation bilaterally, normal work of breathing GI: soft, nontender, nondistended, + BS MS: no deformity or atrophy  Skin: warm and dry, no rash Neuro:  Strength and sensation are intact Psych: euthymic mood, full affect   EKG:  EKG is not ordered today.    Recent Labs: 12/07/2018: TSH 1.470 06/09/2019: ALT 15; BUN 11; Creatinine, Ser 0.74; Hemoglobin 14.0; Platelets 259; Potassium 4.6; Sodium 136    Lipid Panel    Component Value Date/Time   CHOL 141 09/02/2018 1157   TRIG 86 09/02/2018 1157   TRIG 135 09/16/2012 1322   HDL 66 09/02/2018 1157   HDL 60 09/16/2012 1322   CHOLHDL 2.1 09/02/2018 1157   CHOLHDL 3.2 05/06/2012 1713   VLDL 32 05/06/2012 1713   LDLCALC 58 09/02/2018 1157   LDLCALC 92 09/16/2012 1322      Wt Readings from Last 3 Encounters:  06/14/19 207 lb (93.9 kg)  06/09/19 204 lb 9.6 oz (92.8 kg)  03/22/19 204 lb 9.6 oz (92.8 kg)       No flowsheet data found.    ASSESSMENT AND PLAN:  1. . Renovascular hypertension:  Repeat blood pressure was 142/60.  Renal artery duplex in March showed patent stents with no significant restenosis.  Repeat duplex in September of this year.  Continue same medications for now.  Recent labs showed normal renal function.  2. Peripheral arterial disease: Stable bilateral leg claudication.  Most recent vascular studies in September showed stable ABI of 0.5 range.  3. Coronary artery disease involving native coronary arteries without angina: Lexiscan Myoview in November 2019 showed small apical infarct with only mild peri-infarct ischemia and normal ejection fraction.  Continue medical therapy.   4. Hyperlipidemia: Continue high-dose rosuvastatin with a target LDL of less than 70.  5.  Chronic diastolic heart failure: Most likely in the setting of uncontrolled hypertension and severe bilateral renal artery stenosis.  Currently not requiring a loop diuretic since she had her renal artery stents.   Disposition:   FU with me in 6  months  Signed,  [name], MD  06/14/2019 4:53 PM    [facility]

## 2019-06-14 NOTE — Patient Instructions (Signed)
Medication Instructions:  No changes *If you need a refill on your cardiac medications before your next appointment, please call your pharmacy*   Lab Work: None ordered If you have labs (blood work) drawn today and your tests are completely normal, you will receive your results only by: Marland Kitchen MyChart Message (if you have MyChart) OR . A paper copy in the mail If you have any lab test that is abnormal or we need to change your treatment, we will call you to review the results.   Testing/Procedures: Your physician has requested that you have a renal artery duplex in 6 months. During this test, an ultrasound is used to evaluate blood flow to the kidneys. Take your medications as you usually do. This will take place at [place], [address].   No food after 11PM the night before.  Water is OK. (Don't drink liquids if you have been instructed not to for ANOTHER test).  Take two Extra-Strength Gas-X capsules at bedtime the night before test.   Take an additional two Extra-Strength Gas-X capsules three (3) hours before the test or first thing in the morning.    Avoid foods that produce bowel gas, for 24 hours prior to exam (see below).    No breakfast, no chewing gum, no smoking or carbonated beverages.  Patient may take morning medications with water.  Come in for test at least 15 minutes early to register.    Follow-Up: At [facility], you and your health needs are our priority.  As part of our continuing mission to provide you with exceptional heart care, we have created designated Provider Care Teams.  These Care Teams include your primary Cardiologist (physician) and Advanced Practice Providers (APPs -  Physician Assistants and Nurse Practitioners) who all work together to provide you with the care you need, when you need it.  We recommend signing up for the patient portal called "MyChart".  Sign up information is provided on this After Visit Summary.  MyChart is used to  connect with patients for Virtual Visits (Telemedicine).  Patients are able to view lab/test results, encounter notes, upcoming appointments, etc.  Non-urgent messages can be sent to your provider as well.   To learn more about what you can do with MyChart, go to [email or web address].    Your next appointment:   6 month(s)  The format for your next appointment:   In Person  Provider:   [name], MD

## 2019-07-10 ENCOUNTER — Other Ambulatory Visit: Payer: Self-pay | Admitting: Cardiovascular Disease

## 2019-07-10 DIAGNOSIS — E1159 Type 2 diabetes mellitus with other circulatory complications: Secondary | ICD-10-CM

## 2019-07-10 DIAGNOSIS — I152 Hypertension secondary to endocrine disorders: Secondary | ICD-10-CM

## 2019-07-11 NOTE — Telephone Encounter (Signed)
Refill request

## 2019-07-21 ENCOUNTER — Telehealth: Payer: Self-pay | Admitting: Family

## 2019-07-21 MED ORDER — HUMALOG KWIKPEN 200 UNIT/ML ~~LOC~~ SOPN: PEN_INJECTOR | SUBCUTANEOUS | 2 refills | Status: DC

## 2019-07-21 NOTE — Telephone Encounter (Signed)
Sent to pharmacy 

## 2019-07-21 NOTE — Telephone Encounter (Signed)
°  Prescription Request  07/21/2019  What is the name of the medication or equipment? Generic humalog  Have you contacted your pharmacy to request a refill? (if applicable) yes  Which pharmacy would you like this sent to? [facility]   Patient notified that their request is being sent to the clinical staff for review and that they should receive a response within 2 business days.

## 2019-07-25 ENCOUNTER — Other Ambulatory Visit: Payer: Self-pay | Admitting: Cardiovascular Disease

## 2019-08-05 ENCOUNTER — Other Ambulatory Visit: Payer: Self-pay | Admitting: Family

## 2019-08-16 ENCOUNTER — Other Ambulatory Visit: Payer: Self-pay | Admitting: Cardiovascular Disease

## 2019-08-16 DIAGNOSIS — I152 Hypertension secondary to endocrine disorders: Secondary | ICD-10-CM

## 2019-08-16 NOTE — Telephone Encounter (Signed)
Refill request

## 2019-08-18 ENCOUNTER — Ambulatory Visit (INDEPENDENT_AMBULATORY_CARE_PROVIDER_SITE_OTHER): Payer: Medicare Other | Admitting: Family Medicine

## 2019-08-18 ENCOUNTER — Other Ambulatory Visit: Payer: Self-pay

## 2019-08-18 ENCOUNTER — Other Ambulatory Visit: Payer: Self-pay | Admitting: Family

## 2019-08-18 ENCOUNTER — Encounter: Payer: Self-pay | Admitting: Family Medicine

## 2019-08-18 VITALS — BP 139/72 | HR 67 | Temp 97.1°F | Resp 20 | Ht 62.0 in | Wt 209.0 lb

## 2019-08-18 DIAGNOSIS — Z1231 Encounter for screening mammogram for malignant neoplasm of breast: Secondary | ICD-10-CM

## 2019-08-18 DIAGNOSIS — L237 Allergic contact dermatitis due to plants, except food: Secondary | ICD-10-CM | POA: Diagnosis not present

## 2019-08-18 MED ORDER — BETAMETHASONE SOD PHOS & ACET 6 (3-3) MG/ML IJ SUSP
12.0000 mg | Freq: Once | INTRAMUSCULAR | Status: AC
  Administered 2019-08-18: 12 mg via INTRAMUSCULAR

## 2019-08-18 MED ORDER — TRIAMCINOLONE ACETONIDE 0.1 % EX CREA
1.0000 | TOPICAL_CREAM | Freq: Three times a day (TID) | CUTANEOUS | 0 refills | Status: DC
Start: 2019-08-18 — End: 2019-09-13

## 2019-08-18 NOTE — Progress Notes (Signed)
Chief Complaint  Patient presents with   Itchy rash on arms    HPI  Patient presents today for rash on forearms for 1 week. Pruritic. Trying to pull down poison oak and ivy vines. Wore gloves and long sleeves. Denies respiratory distress  PMH: Smoking status noted ROS: Per HPI  Objective: BP 139/72    Pulse 67    Temp (!) 97.1 F (36.2 C) (Temporal)    Resp 20    Ht 5\' 2"  (1.575 m)    Wt 209 lb (94.8 kg)    SpO2 97%    BMI 38.23 kg/m  Gen: NAD, alert, cooperative with exam HEENT: NCAT, EOMI, PERRL CV: RRR, good S1/S2, no murmur Resp: CTABL, no wheezes, non-labored Abd: SNTND, BS present, no guarding or organomegaly Ext: Erythematous papular eruption noted over the antecubital fossa on the left and ventral right forearm of both forearms. Neuro: Alert and oriented, No gross deficits  Assessment and plan:  1. Poison oak     Meds ordered this encounter  Medications   triamcinolone cream (KENALOG) 0.1 %    Sig: Apply 1 application topically 3 (three) times daily. Avoid face and genitalia    Dispense:  45 g    Refill:  0   betamethasone acetate-betamethasone sodium phosphate (CELESTONE) injection 12 mg     Follow up as needed.  Claretta Fraise, MD

## 2019-08-19 ENCOUNTER — Encounter: Payer: Self-pay | Admitting: Family Medicine

## 2019-08-24 ENCOUNTER — Telehealth: Payer: Self-pay | Admitting: Family Medicine

## 2019-08-24 ENCOUNTER — Other Ambulatory Visit: Payer: Self-pay | Admitting: Family Medicine

## 2019-08-24 MED ORDER — HALOBETASOL PROPIONATE 0.05 % EX CREA: TOPICAL_CREAM | Freq: Two times a day (BID) | CUTANEOUS | 0 refills | Status: DC

## 2019-08-24 NOTE — Telephone Encounter (Signed)
Done. Thanks, WS 

## 2019-08-24 NOTE — Telephone Encounter (Signed)
  Prescription Request  08/24/2019  What is the name of the medication or equipment? Needs something stronger for poison ivy or poison oak  Have you contacted your pharmacy to request a refill? (if applicable) NO  Which pharmacy would you like this sent to? [facility] in [place]   Patient notified that their request is being sent to the clinical staff for review and that they should receive a response within 2 business days.

## 2019-08-24 NOTE — Telephone Encounter (Signed)
Saw you 08/18/19 was given Triamcinolone cream 0.1%. Patient asking for something stronger. Please advise

## 2019-08-24 NOTE — Telephone Encounter (Signed)
Patient aware and verbalized understanding. °

## 2019-09-05 ENCOUNTER — Other Ambulatory Visit: Payer: Self-pay | Admitting: Family Medicine

## 2019-09-05 NOTE — Telephone Encounter (Signed)
ok to refill this? [facility] rx'd it for poison oak 08/24/19

## 2019-09-13 ENCOUNTER — Encounter: Payer: Self-pay | Admitting: Family

## 2019-09-13 ENCOUNTER — Ambulatory Visit (INDEPENDENT_AMBULATORY_CARE_PROVIDER_SITE_OTHER): Payer: Medicare Other | Admitting: Family

## 2019-09-13 ENCOUNTER — Other Ambulatory Visit: Payer: Self-pay

## 2019-09-13 VITALS — BP 117/54 | HR 63 | Temp 96.0°F | Ht 62.0 in | Wt 207.0 lb

## 2019-09-13 DIAGNOSIS — I2510 Atherosclerotic heart disease of native coronary artery without angina pectoris: Secondary | ICD-10-CM

## 2019-09-13 DIAGNOSIS — E1151 Type 2 diabetes mellitus with diabetic peripheral angiopathy without gangrene: Secondary | ICD-10-CM | POA: Diagnosis not present

## 2019-09-13 DIAGNOSIS — E1159 Type 2 diabetes mellitus with other circulatory complications: Secondary | ICD-10-CM

## 2019-09-13 DIAGNOSIS — I739 Peripheral vascular disease, unspecified: Secondary | ICD-10-CM

## 2019-09-13 DIAGNOSIS — I152 Hypertension secondary to endocrine disorders: Secondary | ICD-10-CM

## 2019-09-13 DIAGNOSIS — E139 Other specified diabetes mellitus without complications: Secondary | ICD-10-CM

## 2019-09-13 DIAGNOSIS — E785 Hyperlipidemia, unspecified: Secondary | ICD-10-CM

## 2019-09-13 DIAGNOSIS — I252 Old myocardial infarction: Secondary | ICD-10-CM

## 2019-09-13 DIAGNOSIS — I10 Essential (primary) hypertension: Secondary | ICD-10-CM

## 2019-09-13 DIAGNOSIS — I5033 Acute on chronic diastolic (congestive) heart failure: Secondary | ICD-10-CM | POA: Diagnosis not present

## 2019-09-13 DIAGNOSIS — E1169 Type 2 diabetes mellitus with other specified complication: Secondary | ICD-10-CM

## 2019-09-13 DIAGNOSIS — Z955 Presence of coronary angioplasty implant and graft: Secondary | ICD-10-CM

## 2019-09-13 LAB — BAYER DCA HB A1C WAIVED: HB A1C (BAYER DCA - WAIVED): 8.7 % — ABNORMAL HIGH (ref <7.0)

## 2019-09-13 MED ORDER — OZEMPIC (0.25 OR 0.5 MG/DOSE) 2 MG/1.5ML ~~LOC~~ SOPN: PEN_INJECTOR | SUBCUTANEOUS | 1 refills | Status: AC

## 2019-09-13 NOTE — Progress Notes (Signed)
Subjective:    Patient ID: Leah Ferrell, female    DOB: [birth year], 69 y.o.   MRN: [id number]  Chief Complaint  Patient presents with   Medical Management of Chronic Issues    3 mth   Diabetes   Hypertension   Pt presents to the office today for chronic follow up. Pt has CHF, CAD and hx MI and is followed by Cardiologists every 6 months. PT is followed by Vascular for PVD and PAD. PT states these are stable.  Pt had a renal angiography on 03/16/19 and reports feeling much better! She reports her swelling is improved and her BP is improved.  Diabetes She presents for her follow-up diabetic visit. She has type 2 diabetes mellitus. Her disease course has been stable. There are no hypoglycemic associated symptoms. Associated symptoms include foot paresthesias. Pertinent negatives for diabetes include no blurred vision and no visual change. Symptoms are stable. Diabetic complications include nephropathy and peripheral neuropathy. Risk factors for coronary artery disease include dyslipidemia, diabetes mellitus, hypertension and sedentary lifestyle. She is following a generally unhealthy diet. Her overall blood glucose range is 130-140 mg/dl. An ACE inhibitor/angiotensin II receptor blocker is being taken. Eye exam is current.  Hypertension This is a chronic problem. The current episode started more than 1 year ago. The problem has been resolved since onset. The problem is controlled. Pertinent negatives include no blurred vision, malaise/fatigue, peripheral edema or shortness of breath. Risk factors for coronary artery disease include dyslipidemia, diabetes mellitus and smoking/tobacco exposure. The current treatment provides moderate improvement. Hypertensive end-organ damage includes kidney disease and CAD/MI. There is no history of heart failure.  Congestive Heart Failure Presents for follow-up visit. Pertinent negatives include no paroxysmal nocturnal dyspnea, shortness of breath or  unexpected weight change. The symptoms have been stable.  Hyperlipidemia This is a chronic problem. The current episode started more than 1 year ago. The problem is controlled. Recent lipid tests were reviewed and are normal. Exacerbating diseases include obesity. Pertinent negatives include no shortness of breath. The current treatment provides moderate improvement of lipids. Risk factors for coronary artery disease include hypertension, a sedentary lifestyle, dyslipidemia and diabetes mellitus.      Review of Systems  Constitutional: Negative for malaise/fatigue and unexpected weight change.  Eyes: Negative for blurred vision.  Respiratory: Negative for shortness of breath.   All other systems reviewed and are negative.      Objective:   Physical Exam Vitals reviewed.  Constitutional:      General: She is not in acute distress.    Appearance: She is well-developed. She is obese.  HENT:     Head: Normocephalic and atraumatic.     Right Ear: Tympanic membrane normal.     Left Ear: Tympanic membrane normal.  Eyes:     Pupils: Pupils are equal, round, and reactive to light.  Neck:     Thyroid: No thyromegaly.  Cardiovascular:     Rate and Rhythm: Normal rate and regular rhythm.     Heart sounds: Normal heart sounds. No murmur heard.   Pulmonary:     Effort: Pulmonary effort is normal. No respiratory distress.     Breath sounds: Normal breath sounds. No wheezing.  Abdominal:     General: Bowel sounds are normal. There is no distension.     Palpations: Abdomen is soft.     Tenderness: There is no abdominal tenderness.  Musculoskeletal:        General: No tenderness. Normal range of motion.  Cervical back: Normal range of motion and neck supple.  Skin:    General: Skin is warm and dry.  Neurological:     Mental Status: She is alert and oriented to person, place, and time.     Cranial Nerves: No cranial nerve deficit.     Deep Tendon Reflexes: Reflexes are normal and  symmetric.  Psychiatric:        Behavior: Behavior normal.        Thought Content: Thought content normal.        Judgment: Judgment normal.       BP (!) 117/54    Pulse 63    Temp (!) 96 F (35.6 C) (Temporal)    Ht 5' 2"  (1.575 m)    Wt (!) 207 lb (93.9 kg)    SpO2 97%    BMI 37.86 kg/m      Assessment & Plan:  AVALEEN BROWNLEY comes in today with chief complaint of Medical Management of Chronic Issues (3 mth), Diabetes, and Hypertension   Diagnosis and orders addressed:  1. Diabetes mellitus with peripheral vascular disease (HCC) - Bayer DCA Hb A1c Waived - Microalbumin / creatinine urine ratio - CMP14+EGFR - CBC with Differential/Platelet - Semaglutide,0.25 or 0.5MG/DOS, (OZEMPIC, 0.25 OR 0.5 MG/DOSE,) 2 MG/1.5ML SOPN; Inject 0.1875 mLs (0.25 mg total) into the skin once a week for 28 days, THEN 0.375 mLs (0.5 mg total) once a week for 28 days.  Dispense: 12 pen; Refill: 1  2. Acute on chronic diastolic congestive heart failure (HCC) - CMP14+EGFR - CBC with Differential/Platelet  3. Coronary artery disease involving native coronary artery of native heart without angina pectoris - CMP14+EGFR - CBC with Differential/Platelet  4. Hypertension associated with diabetes ([place]) - CMP14+EGFR - CBC with Differential/Platelet  5. PAD (peripheral artery disease) (HCC) - CMP14+EGFR - CBC with Differential/Platelet  6. PVD (peripheral vascular disease) (HCC) - CMP14+EGFR - CBC with Differential/Platelet  7. Diabetes 1.5, managed as type 2 ([place]) -Will start Ozempic today Strict low carb diet  - CMP14+EGFR - CBC with Differential/Platelet - Semaglutide,0.25 or 0.5MG/DOS, (OZEMPIC, 0.25 OR 0.5 MG/DOSE,) 2 MG/1.5ML SOPN; Inject 0.1875 mLs (0.25 mg total) into the skin once a week for 28 days, THEN 0.375 mLs (0.5 mg total) once a week for 28 days.  Dispense: 12 pen; Refill: 1  8. Hyperlipidemia associated with type 2 diabetes mellitus (HCC) - CMP14+EGFR - CBC with  Differential/Platelet - Lipid panel  9. Hx of non-ST elevation myocardial infarction (NSTEMI) - CMP14+EGFR - CBC with Differential/Platelet  10. Morbid obesity ([place]) - CMP14+EGFR - CBC with Differential/Platelet  11. S/P primary angioplasty with coronary stent - CMP14+EGFR - CBC with Differential/Platelet   Labs pending Health Maintenance reviewed Diet and exercise encouraged  Follow up plan: 3 months    Evelina Dun, FNP

## 2019-09-13 NOTE — Patient Instructions (Signed)

## 2019-09-14 ENCOUNTER — Other Ambulatory Visit: Payer: Self-pay | Admitting: Cardiovascular Disease

## 2019-09-14 ENCOUNTER — Ambulatory Visit
Admission: RE | Admit: 2019-09-14 | Discharge: 2019-09-14 | Disposition: A | Discharge status: Home / Self Care | Payer: Medicare Other | Source: Ambulatory Visit | Attending: Family | Admitting: Family

## 2019-09-14 DIAGNOSIS — I152 Hypertension secondary to endocrine disorders: Secondary | ICD-10-CM

## 2019-09-14 DIAGNOSIS — E1159 Type 2 diabetes mellitus with other circulatory complications: Secondary | ICD-10-CM

## 2019-09-14 DIAGNOSIS — Z1231 Encounter for screening mammogram for malignant neoplasm of breast: Secondary | ICD-10-CM | POA: Diagnosis not present

## 2019-09-14 LAB — LIPID PANEL
Chol/HDL Ratio: 2.3 ratio (ref 0.0–4.4)
Cholesterol, Total: 180 mg/dL (ref 100–199)
HDL: 79 mg/dL (ref >39)
LDL Chol Calc (NIH): 82 mg/dL (ref 0–99)
Triglycerides: 107 mg/dL (ref 0–149)
VLDL Cholesterol Cal: 19 mg/dL (ref 5–40)

## 2019-09-14 LAB — CBC WITH DIFFERENTIAL/PLATELET
Basophils Absolute: 0.1 10*3/uL (ref 0.0–0.2)
Basos: 0 %
EOS (ABSOLUTE): 0.3 10*3/uL (ref 0.0–0.4)
Eos: 3 %
Hematocrit: 44.7 % (ref 34.0–46.6)
Hemoglobin: 14.5 g/dL (ref 11.1–15.9)
Immature Grans (Abs): 0 10*3/uL (ref 0.0–0.1)
Immature Granulocytes: 0 %
Lymphocytes Absolute: 3.4 10*3/uL — ABNORMAL HIGH (ref 0.7–3.1)
Lymphs: 30 %
MCH: 30.7 pg (ref 26.6–33.0)
MCHC: 32.4 g/dL (ref 31.5–35.7)
MCV: 95 fL (ref 79–97)
Monocytes Absolute: 0.7 10*3/uL (ref 0.1–0.9)
Monocytes: 6 %
Neutrophils Absolute: 6.8 10*3/uL (ref 1.4–7.0)
Neutrophils: 61 %
Platelets: 297 10*3/uL (ref 150–450)
RBC: 4.73 x10E6/uL (ref 3.77–5.28)
RDW: 14 % (ref 11.7–15.4)
WBC: 11.3 10*3/uL — ABNORMAL HIGH (ref 3.4–10.8)

## 2019-09-14 LAB — CMP14+EGFR
ALT: 21 IU/L (ref 0–32)
AST: 18 IU/L (ref 0–40)
Albumin/Globulin Ratio: 1.5 (ref 1.2–2.2)
Albumin: 4.6 g/dL (ref 3.8–4.8)
Alkaline Phosphatase: 57 IU/L (ref 48–121)
BUN/Creatinine Ratio: 15 (ref 12–28)
BUN: 13 mg/dL (ref 8–27)
Bilirubin Total: 0.3 mg/dL (ref 0.0–1.2)
CO2: 28 mmol/L (ref 20–29)
Calcium: 10.4 mg/dL — ABNORMAL HIGH (ref 8.7–10.3)
Chloride: 97 mmol/L (ref 96–106)
Creatinine, Ser: 0.88 mg/dL (ref 0.57–1.00)
GFR calc Af Amer: 78 mL/min/{1.73_m2} (ref >59)
GFR calc non Af Amer: 68 mL/min/{1.73_m2} (ref >59)
Globulin, Total: 3.1 g/dL (ref 1.5–4.5)
Glucose: 153 mg/dL — ABNORMAL HIGH (ref 65–99)
Potassium: 4.8 mmol/L (ref 3.5–5.2)
Sodium: 138 mmol/L (ref 134–144)
Total Protein: 7.7 g/dL (ref 6.0–8.5)

## 2019-09-14 LAB — MICROALBUMIN / CREATININE URINE RATIO
Creatinine, Urine: 252.9 mg/dL
Microalb/Creat Ratio: 44 mg/g creat — ABNORMAL HIGH (ref 0–29)
Microalbumin, Urine: 112.1 ug/mL

## 2019-09-15 NOTE — Telephone Encounter (Signed)
Refill Request.  

## 2019-09-20 ENCOUNTER — Telehealth: Payer: Self-pay | Admitting: Cardiovascular Disease

## 2019-09-20 NOTE — Telephone Encounter (Signed)
Called patient- she states that she would like Dr.Arida opinion on if okay to start Ozempic injection- if it is safe for her.  She also mentions needing an appointment in October, which I made- and needing her [place] in September, which is ordered already.

## 2019-09-20 NOTE — Telephone Encounter (Signed)
    Pt would like to speak with Dr. Tyrell Antonio nurse. She said he has important question to ask

## 2019-09-20 NOTE — Telephone Encounter (Signed)
It should be fine from a cardiac standpoint.

## 2019-09-21 NOTE — Telephone Encounter (Signed)
Left a message for the patient to call back.  

## 2019-09-21 NOTE — Telephone Encounter (Signed)
Patient returning call.

## 2019-09-21 NOTE — Telephone Encounter (Signed)
The patient has been made aware and verbalized her understanding.  

## 2019-09-23 ENCOUNTER — Telehealth: Payer: Self-pay | Admitting: Family

## 2019-09-23 ENCOUNTER — Telehealth: Payer: Self-pay | Admitting: Family Medicine

## 2019-09-23 DIAGNOSIS — E1169 Type 2 diabetes mellitus with other specified complication: Secondary | ICD-10-CM

## 2019-09-23 DIAGNOSIS — E785 Hyperlipidemia, unspecified: Secondary | ICD-10-CM

## 2019-09-23 MED ORDER — ROSUVASTATIN CALCIUM 40 MG PO TABS: 40.0000 mg | ORAL_TABLET | Freq: Every day | ORAL | 3 refills | Status: DC

## 2019-09-23 NOTE — Telephone Encounter (Signed)
Pt seen 09/13/2019 and still No refill at [facility] for rosuvastatin (CRESTOR) 40 MG tablet. Please call when sent it

## 2019-09-23 NOTE — Telephone Encounter (Signed)
Refill sent in patient aware

## 2019-10-03 ENCOUNTER — Other Ambulatory Visit: Payer: Self-pay | Admitting: Family

## 2019-10-03 DIAGNOSIS — I152 Hypertension secondary to endocrine disorders: Secondary | ICD-10-CM

## 2019-10-03 DIAGNOSIS — E1159 Type 2 diabetes mellitus with other circulatory complications: Secondary | ICD-10-CM

## 2019-10-06 ENCOUNTER — Other Ambulatory Visit: Payer: Self-pay | Admitting: Cardiovascular Disease

## 2019-10-06 DIAGNOSIS — E1159 Type 2 diabetes mellitus with other circulatory complications: Secondary | ICD-10-CM

## 2019-10-06 DIAGNOSIS — I152 Hypertension secondary to endocrine disorders: Secondary | ICD-10-CM

## 2019-10-18 ENCOUNTER — Other Ambulatory Visit: Payer: Self-pay | Admitting: Family

## 2019-11-15 ENCOUNTER — Other Ambulatory Visit: Payer: Self-pay | Admitting: Cardiovascular Disease

## 2019-11-15 ENCOUNTER — Other Ambulatory Visit: Payer: Self-pay | Admitting: Family

## 2019-11-15 NOTE — Telephone Encounter (Signed)
Refill request

## 2019-11-22 ENCOUNTER — Ambulatory Visit (INDEPENDENT_AMBULATORY_CARE_PROVIDER_SITE_OTHER): Payer: Medicare Other | Admitting: Cardiovascular Disease

## 2019-11-22 ENCOUNTER — Encounter: Payer: Self-pay | Admitting: Cardiovascular Disease

## 2019-11-22 ENCOUNTER — Other Ambulatory Visit: Payer: Self-pay

## 2019-11-22 VITALS — BP 144/57 | HR 58 | Ht 62.5 in | Wt 210.2 lb

## 2019-11-22 DIAGNOSIS — I5032 Chronic diastolic (congestive) heart failure: Secondary | ICD-10-CM | POA: Diagnosis not present

## 2019-11-22 DIAGNOSIS — I739 Peripheral vascular disease, unspecified: Secondary | ICD-10-CM

## 2019-11-22 DIAGNOSIS — I2510 Atherosclerotic heart disease of native coronary artery without angina pectoris: Secondary | ICD-10-CM | POA: Diagnosis not present

## 2019-11-22 DIAGNOSIS — I150 Renovascular hypertension: Secondary | ICD-10-CM | POA: Diagnosis not present

## 2019-11-22 DIAGNOSIS — E785 Hyperlipidemia, unspecified: Secondary | ICD-10-CM | POA: Diagnosis not present

## 2019-11-22 NOTE — Progress Notes (Signed)
Cardiology Office Note   Date:  11/22/2019   ID:  Leah Ferrell, DOB December 21, 1950, MRN [id number]  PCP:  Sharion Balloon, FNP  Cardiologist: Dr. Fletcher [name]   No chief complaint on file.     History of Present Illness: Leah Ferrell is a 69 y.o. female who presents for a followup visit regarding peripheral arterial disease and coronary artery disease.  She has known history of coronary artery disease status post drug-eluting stent placement to the LAD X 2 Most recently in 2015 for in-stent restenosis.  She is known to have peripheral arterial disease status post left common iliac artery stent placement followed by left common femoral artery resection and bypass from left external iliac to the left profunda done by Dr. Kellie Simmering in 2015. She is known to have chronically occluded left SFA being managed medically.  She had worsening left leg claudication in 2018 with elevated velocity at the proximal anastomosis of the graft.   Angiography in March 2018 showed mild to moderate calcified common iliac artery disease with patent stent in the left common iliac artery. There was severe stenosis in the left common femoral artery at the proximal anastomosis of the bypass to the profunda. I performed successful angioplasty and drug-coated balloon angioplasty.  She is status post bilateral renal artery stenting in January 2021 due to refractory hypertension and recurrent heart failure.  She had significant improvement in blood pressure control as well as heart failure symptoms since then.    She has been doing reasonably well with no recent chest pain.  She reports stable exertional dyspnea.  She does complain of increased right calf claudication.  She has been trying to do 30 minutes on the treadmill few times a week but usually she has to stop and rest after a few minutes due to right calf claudication.  No rest pain or lower extremity ulceration.   Past Medical History:  Diagnosis Date  . CAD  (coronary artery disease)    a. 04/2012 NSTEMI: s/p DES to LAD.  Marland Kitchen Chronic diastolic CHF (congestive heart failure) ([place])   . Diabetes mellitus without complication ([place])   . Dysrhythmia   . Environmental and seasonal allergies   . Hyperlipidemia   . Hypertension   . Leukocytosis   . Morbid obesity ([place])   . NSTEMI (non-ST elevated myocardial infarction) ([place]) 04/27/2012  . PONV (postoperative nausea and vomiting)   . PVD (peripheral vascular disease) ([place])    a. 04/2012 ABI: R 0.49, L 0.39. Angiography 06/14: Significant ostial left common iliac artery stenosis extending into the distal aorta, severe left common femoral artery stenosis, bilateral SFA occlusion with heavy calcifications. Functionally, one-vessel runoff bilaterally below the knee  . Shortness of breath     Past Surgical History:  Procedure Laterality Date  . ABDOMINAL AORTAGRAM N/A 07/21/2012   Procedure: ABDOMINAL Maxcine Ham;  Surgeon: Wellington Hampshire, MD;  Location: [facility] CATH LAB;  Service: Cardiovascular;  Laterality: N/A;  . CARDIAC CATHETERIZATION     stents X 2  . CORONARY ANGIOGRAM  04/27/2012   Procedure: CORONARY ANGIOGRAM;  Surgeon: Thayer Headings, MD;  Location: [facility] CATH LAB;  Service: Cardiovascular;;  . ENDARTERECTOMY FEMORAL Left 11/02/2013   Procedure: RESECTION LEFT COMMON FEMORAL ARTERY AND INSERTION OF INTERPOSITION 69mm HEMASHIELD GRAFT FROM LEFT EXTERNAL  ILIAC ARTERY TO LEFT PROFUNDA  FEMORIS ARTERY;  Surgeon: Mal Misty, MD;  Location: [facility];  Service: Vascular;  Laterality: Left;  . EYE SURGERY Right 2017  .  FRACTURE SURGERY Right Jul 07, 2014   Right Foot-  Pt.fell  at Home  by Heat duct  . ILIAC ARTERY STENT Left 08/31/2013  . LAD stent    . LEFT HEART CATHETERIZATION WITH CORONARY ANGIOGRAM N/A 04/23/2012   Procedure: LEFT HEART CATHETERIZATION WITH CORONARY ANGIOGRAM;  Surgeon: Peter M Martinique, MD;  Location: [facility] CATH LAB;  Service: Cardiovascular;  Laterality: N/A;  . LOWER EXTREMITY ANGIOGRAM Left  08/31/2013   Procedure: LOWER EXTREMITY ANGIOGRAM;  Surgeon: Wellington Hampshire, MD;  Location: [facility] CATH LAB;  Service: Cardiovascular;  Laterality: Left;  . PERCUTANEOUS STENT INTERVENTION Left 08/31/2013   Procedure: PERCUTANEOUS STENT INTERVENTION;  Surgeon: Wellington Hampshire, MD;  Location: [facility] CATH LAB;  Service: Cardiovascular;  Laterality: Left;  Common Iliac artery  . PERIPHERAL VASCULAR CATHETERIZATION N/A 03/19/2016   Procedure: Abdominal Aortogram w/Lower Extremity;  Surgeon: Wellington Hampshire, MD;  Location: [facility] CV LAB;  Service: Cardiovascular;  Laterality: N/A;  . PERIPHERAL VASCULAR CATHETERIZATION  03/19/2016   Procedure: Peripheral Vascular Balloon Angioplasty-CFA Left;  Surgeon: Wellington Hampshire, MD;  Location: [facility] CV LAB;  Service: Cardiovascular;;  . PERIPHERAL VASCULAR INTERVENTION Bilateral 03/16/2019   Procedure: PERIPHERAL VASCULAR INTERVENTION;  Surgeon: Wellington Hampshire, MD;  Location: [facility] CV LAB;  Service: Cardiovascular;  Laterality: Bilateral;  RENAL  . RENAL ANGIOGRAPHY  03/16/2019  . RENAL ANGIOGRAPHY Bilateral 03/16/2019   Procedure: RENAL ANGIOGRAPHY;  Surgeon: Wellington Hampshire, MD;  Location: [facility] CV LAB;  Service: Cardiovascular;  Laterality: Bilateral;  . 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 acetaminophen (TYLENOL) 325 MG tablet Take 2 tablets (650 mg total) by mouth every 6 (six) hours as needed for mild pain (or Fever >/= 101). 20 tablet 0  . albuterol (PROVENTIL HFA) 108 (90 Base) MCG/ACT inhaler INHALE ONE TO TWO PUFFS BY  MOUTH EVERY 6 HOURS AS  NEEDED FOR WHEEZING OR FOR  SHORTNESS OF BREATH (Patient taking differently: 1-2 puffs. INHALE ONE TO TWO PUFFS BY  MOUTH EVERY 6 HOURS AS  NEEDED FOR WHEEZING OR FOR  SHORTNESS OF BREATH) 13.4 g 2  . amLODipine (NORVASC) 5 MG tablet Take 1 tablet by mouth once daily 30 tablet 2  . Ascorbic Acid (VITAMIN C) 1000 MG  tablet Take 1,000 mg by mouth 3 (three) times daily.     Marland Kitchen aspirin EC 81 MG tablet Take 81 mg by mouth daily. In the morning    . BD ULTRA-FINE PEN NEEDLES 29G X 12.7MM MISC USE PENNEEDLES 3 TIMES  DAILY AS DIRECTED 270 each 1  . Calcium Carbonate-Vit D-Min (CALTRATE 600+D PLUS MINERALS) 600-800 MG-UNIT TABS Take 1 tablet by mouth 3 (three) times daily.     . carvedilol (COREG) 6.25 MG tablet Take 1 tablet by mouth twice daily 60 tablet 0  . Cholecalciferol (VITAMIN D-3) 125 MCG (5000 UT) TABS Take 5,000 Units by mouth daily.    . clopidogrel (PLAVIX) 75 MG tablet TAKE 1 TABLET BY MOUTH  DAILY (Patient taking differently: Take 75 mg by mouth daily. ) 90 tablet 0  . fish oil-omega-3 fatty acids 1000 MG capsule Take 1 g by mouth daily.     . halobetasol (ULTRAVATE) 0.05 % cream APPLY  CREAM TOPICALLY TWICE DAILY 45 g 0  . HUMALOG KWIKPEN 200 UNIT/ML KwikPen INJECT 50 UNITS SUBCUTANEOUSLY THREE TIMES DAILY BEFORE MEAL(S) 24 mL 0  . hydroxypropyl  methylcellulose (ISOPTO TEARS) 2.5 % ophthalmic solution Place 1 drop into both eyes 3 (three) times daily as needed (for dry eyes).    Marland Kitchen lisinopril (ZESTRIL) 20 MG tablet Take 1 tablet by mouth twice daily 60 tablet 5  . montelukast (SINGULAIR) 10 MG tablet Take 1 tablet by mouth once daily with breakfast 90 tablet 1  . nitroGLYCERIN (NITROSTAT) 0.4 MG SL tablet Place 1 tablet (0.4 mg total) under the tongue every 5 (five) minutes x 3 doses as needed for chest pain (up to 3 doses). 25 tablet 4  . RELION PEN NEEDLES 29G X 12MM MISC USE 1 NEEDLE TO INJECT INSULIN SUBCUTANEOUSLY 3 TIMES DAILY (Patient taking differently: 1 Device by Subconjunctival route 3 (three) times daily. ) 50 each 5  . rosuvastatin (CRESTOR) 40 MG tablet Take 1 tablet (40 mg total) by mouth daily. 90 tablet 3  . spironolactone (ALDACTONE) 25 MG tablet Take 1 tablet (25 mg total) by mouth daily. 90 tablet 3   No current facility-administered medications for this visit.    Allergies:    Tape    Social History:  The patient  reports that she quit smoking about 18 years ago. Her smoking use included cigarettes. She started smoking about 55 years ago. She has a 36.00 pack-year smoking history. She has never used smokeless tobacco. She reports that she does not drink alcohol and does not use drugs.   Family History:  The patient's family history includes Alcohol abuse in her brother; Diabetes in her brother, daughter, maternal grandmother, mother, and son; Heart attack in her maternal grandfather; Heart disease in her brother; Hyperlipidemia in her mother and son; Hypertension in her daughter, mother, and son; Other in her sister and sister; Peripheral vascular disease in her mother.    ROS:  Please see the history of present illness.   Otherwise, review of systems are positive for none.   All other systems are reviewed and negative.    PHYSICAL EXAM: VS:  BP (!) 144/57   Pulse (!) 58   Ht 5' 2.5" (1.588 m)   Wt 210 lb 3.2 oz (95.3 kg)   SpO2 96%   BMI 37.83 kg/m  , BMI Body mass index is 37.83 kg/m. GEN: Well nourished, well developed, in no acute distress  HEENT: normal  Neck: no JVD, carotid bruits, or masses Cardiac: RRR; no  rubs, or gallops , trace edema . There is 2/6 systolic ejection murmur in the aortic area Respiratory:  clear to auscultation bilaterally, normal work of breathing GI: soft, nontender, nondistended, + BS MS: no deformity or atrophy  Skin: warm and dry, no rash Neuro:  Strength and sensation are intact Psych: euthymic mood, full affect   EKG:  EKG is  ordered today.  EKG showed sinus bradycardia with possible left atrial enlargement.  Recent Labs: 12/07/2018: TSH 1.470 09/13/2019: ALT 21; BUN 13; Creatinine, Ser 0.88; Hemoglobin 14.5; Platelets 297; Potassium 4.8; Sodium 138    Lipid Panel    Component Value Date/Time   CHOL 180 09/13/2019 1330   TRIG 107 09/13/2019 1330   TRIG 135 09/16/2012 1322   HDL 79 09/13/2019 1330   HDL 60  09/16/2012 1322   CHOLHDL 2.3 09/13/2019 1330   CHOLHDL 3.2 05/06/2012 1713   VLDL 32 05/06/2012 1713   LDLCALC 82 09/13/2019 1330   LDLCALC 92 09/16/2012 1322      Wt Readings from Last 3 Encounters:  11/22/19 210 lb 3.2 oz (95.3 kg)  09/13/19 (!) 207  lb (93.9 kg)  08/18/19 209 lb (94.8 kg)       No flowsheet data found.    ASSESSMENT AND PLAN:  1. . 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  Renal artery duplex has been historically difficult due to body habitus.  2. Peripheral arterial disease: She reports worsening right calf claudication.  She is scheduled for Doppler studies tomorrow.  3. Coronary artery disease involving native coronary arteries without angina: Lexiscan Myoview in November 2019 showed small apical infarct with only mild peri-infarct ischemia and normal ejection fraction.  Continue medical therapy.   4. Hyperlipidemia: Continue high-dose rosuvastatin with a target LDL of less than 70.  5.  Chronic diastolic heart failure: Most likely in the setting of uncontrolled hypertension and severe bilateral renal artery stenosis.  Currently not requiring a loop diuretic since she had her renal artery stents.   Disposition:   FU with me in 6  months  Signed,  [name], MD  11/22/2019 11:56 AM    [place]

## 2019-11-22 NOTE — Patient Instructions (Signed)

## 2019-11-23 ENCOUNTER — Ambulatory Visit (INDEPENDENT_AMBULATORY_CARE_PROVIDER_SITE_OTHER): Payer: Medicare Other

## 2019-11-23 ENCOUNTER — Other Ambulatory Visit: Payer: Self-pay | Admitting: Cardiovascular Disease

## 2019-11-23 DIAGNOSIS — I739 Peripheral vascular disease, unspecified: Secondary | ICD-10-CM

## 2019-11-23 DIAGNOSIS — Z95820 Peripheral vascular angioplasty status with implants and grafts: Secondary | ICD-10-CM

## 2019-12-16 ENCOUNTER — Other Ambulatory Visit: Payer: Self-pay | Admitting: Family

## 2019-12-19 ENCOUNTER — Other Ambulatory Visit: Payer: Self-pay

## 2019-12-19 ENCOUNTER — Encounter: Payer: Self-pay | Admitting: Family

## 2019-12-19 ENCOUNTER — Ambulatory Visit (INDEPENDENT_AMBULATORY_CARE_PROVIDER_SITE_OTHER): Payer: Medicare Other | Admitting: Family

## 2019-12-19 VITALS — BP 130/63 | HR 76 | Temp 97.2°F | Ht 62.5 in | Wt 208.8 lb

## 2019-12-19 DIAGNOSIS — E785 Hyperlipidemia, unspecified: Secondary | ICD-10-CM | POA: Diagnosis not present

## 2019-12-19 DIAGNOSIS — E1151 Type 2 diabetes mellitus with diabetic peripheral angiopathy without gangrene: Secondary | ICD-10-CM

## 2019-12-19 DIAGNOSIS — E1159 Type 2 diabetes mellitus with other circulatory complications: Secondary | ICD-10-CM

## 2019-12-19 DIAGNOSIS — I2510 Atherosclerotic heart disease of native coronary artery without angina pectoris: Secondary | ICD-10-CM

## 2019-12-19 DIAGNOSIS — I701 Atherosclerosis of renal artery: Secondary | ICD-10-CM | POA: Diagnosis not present

## 2019-12-19 DIAGNOSIS — I252 Old myocardial infarction: Secondary | ICD-10-CM

## 2019-12-19 DIAGNOSIS — E1169 Type 2 diabetes mellitus with other specified complication: Secondary | ICD-10-CM | POA: Diagnosis not present

## 2019-12-19 DIAGNOSIS — I152 Hypertension secondary to endocrine disorders: Secondary | ICD-10-CM

## 2019-12-19 DIAGNOSIS — Z955 Presence of coronary angioplasty implant and graft: Secondary | ICD-10-CM

## 2019-12-19 DIAGNOSIS — E139 Other specified diabetes mellitus without complications: Secondary | ICD-10-CM

## 2019-12-19 DIAGNOSIS — I5033 Acute on chronic diastolic (congestive) heart failure: Secondary | ICD-10-CM

## 2019-12-19 DIAGNOSIS — I739 Peripheral vascular disease, unspecified: Secondary | ICD-10-CM

## 2019-12-19 LAB — CMP14+EGFR
ALT: 33 IU/L — ABNORMAL HIGH (ref 0–32)
AST: 21 IU/L (ref 0–40)
Albumin/Globulin Ratio: 1.2 (ref 1.2–2.2)
Albumin: 4.2 g/dL (ref 3.8–4.8)
Alkaline Phosphatase: 56 IU/L (ref 44–121)
BUN/Creatinine Ratio: 19 (ref 12–28)
BUN: 16 mg/dL (ref 8–27)
Bilirubin Total: 0.3 mg/dL (ref 0.0–1.2)
CO2: 24 mmol/L (ref 20–29)
Calcium: 10.1 mg/dL (ref 8.7–10.3)
Chloride: 94 mmol/L — ABNORMAL LOW (ref 96–106)
Creatinine, Ser: 0.86 mg/dL (ref 0.57–1.00)
GFR calc Af Amer: 80 mL/min/{1.73_m2} (ref >59)
GFR calc non Af Amer: 70 mL/min/{1.73_m2} (ref >59)
Globulin, Total: 3.4 g/dL (ref 1.5–4.5)
Glucose: 197 mg/dL — ABNORMAL HIGH (ref 65–99)
Potassium: 4.8 mmol/L (ref 3.5–5.2)
Sodium: 134 mmol/L (ref 134–144)
Total Protein: 7.6 g/dL (ref 6.0–8.5)

## 2019-12-19 LAB — CBC WITH DIFFERENTIAL/PLATELET
Basophils Absolute: 0.1 10*3/uL (ref 0.0–0.2)
Basos: 1 %
EOS (ABSOLUTE): 0.2 10*3/uL (ref 0.0–0.4)
Eos: 1 %
Hematocrit: 42.7 % (ref 34.0–46.6)
Hemoglobin: 13.9 g/dL (ref 11.1–15.9)
Immature Grans (Abs): 0 10*3/uL (ref 0.0–0.1)
Immature Granulocytes: 0 %
Lymphocytes Absolute: 3.4 10*3/uL — ABNORMAL HIGH (ref 0.7–3.1)
Lymphs: 26 %
MCH: 31 pg (ref 26.6–33.0)
MCHC: 32.6 g/dL (ref 31.5–35.7)
MCV: 95 fL (ref 79–97)
Monocytes Absolute: 0.7 10*3/uL (ref 0.1–0.9)
Monocytes: 6 %
Neutrophils Absolute: 8.7 10*3/uL — ABNORMAL HIGH (ref 1.4–7.0)
Neutrophils: 66 %
Platelets: 300 10*3/uL (ref 150–450)
RBC: 4.49 x10E6/uL (ref 3.77–5.28)
RDW: 13.1 % (ref 11.7–15.4)
WBC: 13.1 10*3/uL — ABNORMAL HIGH (ref 3.4–10.8)

## 2019-12-19 LAB — BAYER DCA HB A1C WAIVED: HB A1C (BAYER DCA - WAIVED): 8.6 % — ABNORMAL HIGH (ref <7.0)

## 2019-12-19 MED ORDER — BLOOD GLUCOSE METER KIT: PACK | 0 refills | Status: AC

## 2019-12-19 MED ORDER — HUMALOG KWIKPEN 200 UNIT/ML ~~LOC~~ SOPN: PEN_INJECTOR | SUBCUTANEOUS | 11 refills | Status: DC

## 2019-12-19 MED ORDER — GLUCOSE METER TEST VI STRP: ORAL_STRIP | 12 refills | Status: AC

## 2019-12-19 NOTE — Patient Instructions (Addendum)

## 2019-12-19 NOTE — Progress Notes (Signed)
Subjective:    Patient ID: Leah Ferrell, female    DOB: [birth year], 69 y.o.   MRN: [id number]  Chief Complaint  Patient presents with   Medical Management of Chronic Issues   Diabetes   Hypertension   Pt presents to the office today for chronic follow up. Pt has CHF, CAD and hx MI and is followed by Cardiologists every 6 months. PT is followed by Vascular for PVD and PAD. PT states these are stable.  [place] renal angiography on 03/16/19 and reports feeling much better! She reports her swelling is improved and her BP is improved. Diabetes She has type 2 diabetes mellitus. Her disease course has been stable. There are no hypoglycemic associated symptoms. Associated symptoms include fatigue and foot paresthesias. Pertinent negatives for diabetes include no blurred vision. There are no hypoglycemic complications. Symptoms are stable. Diabetic complications include heart disease, nephropathy and peripheral neuropathy. Risk factors for coronary artery disease include dyslipidemia, diabetes mellitus, hypertension, sedentary lifestyle and post-menopausal. She is following a generally unhealthy diet. Her overall blood glucose range is 180-200 mg/dl. An ACE inhibitor/angiotensin II receptor blocker is being taken. Eye exam is current.  Hypertension This is a chronic problem. The current episode started more than 1 year ago. The problem has been resolved since onset. The problem is controlled. Associated symptoms include malaise/fatigue. Pertinent negatives include no blurred vision, peripheral edema or shortness of breath. Risk factors for coronary artery disease include dyslipidemia, diabetes mellitus, obesity and sedentary lifestyle. The current treatment provides moderate improvement. Hypertensive end-organ damage includes kidney disease and CAD/MI.  Hyperlipidemia This is a chronic problem. The current episode started more than 1 year ago. The problem is controlled. Recent lipid tests  were reviewed and are normal. Exacerbating diseases include obesity. She has no history of hypothyroidism. Pertinent negatives include no shortness of breath. Current antihyperlipidemic treatment includes statins. The current treatment provides moderate improvement of lipids. Risk factors for coronary artery disease include dyslipidemia, diabetes mellitus, hypertension, a sedentary lifestyle and post-menopausal.  Congestive Heart Failure Presents for follow-up visit. Associated symptoms include claudication and fatigue. Pertinent negatives include no edema or shortness of breath. The symptoms have been stable.      Review of Systems  Constitutional: Positive for fatigue and malaise/fatigue.  Eyes: Negative for blurred vision.  Respiratory: Negative for shortness of breath.   Cardiovascular: Positive for claudication.  All other systems reviewed and are negative.      Objective:   Physical Exam Vitals reviewed.  Constitutional:      General: She is not in acute distress.    Appearance: She is well-developed.  HENT:     Head: Normocephalic and atraumatic.     Right Ear: Tympanic membrane normal.     Left Ear: Tympanic membrane normal.  Eyes:     Pupils: Pupils are equal, round, and reactive to light.  Neck:     Thyroid: No thyromegaly.  Cardiovascular:     Rate and Rhythm: Normal rate and regular rhythm.     Heart sounds: Normal heart sounds. No murmur heard.   Pulmonary:     Effort: Pulmonary effort is normal. No respiratory distress.     Breath sounds: Normal breath sounds. No wheezing.  Abdominal:     General: Bowel sounds are normal. There is no distension.     Palpations: Abdomen is soft.     Tenderness: There is no abdominal tenderness.  Musculoskeletal:        General: No tenderness. Normal range of  motion.     Cervical back: Normal range of motion and neck supple.     Right lower leg: Edema (trace) present.     Left lower leg: Edema (trace) present.  Skin:     General: Skin is warm and dry.     Comments: Discoloration of bilateral feet related to PAD and PVD  Neurological:     Mental Status: She is alert and oriented to person, place, and time.     Cranial Nerves: No cranial nerve deficit.     Deep Tendon Reflexes: Reflexes are normal and symmetric.  Psychiatric:        Behavior: Behavior normal.        Thought Content: Thought content normal.        Judgment: Judgment normal.     Diabetic Foot Exam - Simple   Simple Foot Form Diabetic Foot exam was performed with the following findings: Yes 12/19/2019 11:37 AM  Visual Inspection No deformities, no ulcerations, no other skin breakdown bilaterally: Yes Sensation Testing Intact to touch and monofilament testing bilaterally: Yes Pulse Check See comments: Yes Comments Discoloration of bilateral feet, diminished pulses      BP 130/63    Pulse 76    Temp (!) 97.2 F (36.2 C) (Temporal)    Ht 5' 2.5" (1.588 m)    Wt 208 lb 12.8 oz (94.7 kg)    SpO2 97%    BMI 37.58 kg/m      Assessment & Plan:  Leah Ferrell comes in today with chief complaint of Medical Management of Chronic Issues, Diabetes, and Hypertension   Diagnosis and orders addressed:  1. Hyperlipidemia associated with type 2 diabetes mellitus (HCC) - CMP14+EGFR - CBC with Differential/Platelet  2. Diabetes mellitus with peripheral vascular disease ([place]) Pt does not want to change medications Strict low carb  Needs appt with Clinical Pharm - [facility] DCA Hb A1c Waived - glucose blood (GLUCOSE METER TEST) test strip; Use 6-10 times a day, Uses ReliOn  Dispense: 900 each; Refill: 12 - blood glucose meter kit and supplies; Dispense based on patient and insurance preference. Use up to four times daily as directed. (FOR ICD-10 E10.9, E11.9).  Dispense: 1 each; Refill: 0 - insulin lispro (HUMALOG KWIKPEN) 200 UNIT/ML KwikPen; INJECT 50 UNITS SUBCUTANEOUSLY THREE TIMES DAILY BEFORE MEAL(S)  Dispense: 24 mL; Refill: 11 -  CMP14+EGFR - CBC with Differential/Platelet  3. Acute on chronic diastolic CHF (congestive heart failure) (HCC) - CMP14+EGFR - CBC with Differential/Platelet  4. Bilateral renal artery stenosis (HCC) - CMP14+EGFR - CBC with Differential/Platelet  5. Coronary artery disease involving native coronary artery of native heart without angina pectoris - CMP14+EGFR - CBC with Differential/Platelet  6. Hypertension associated with diabetes ([place]) - CMP14+EGFR - CBC with Differential/Platelet  7. PAD (peripheral artery disease) (HCC) - CMP14+EGFR - CBC with Differential/Platelet  8. 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 Use up to four times daily as directed. (FOR ICD-10 E10.9, E11.9).  Dispense: 1 each; Refill: 0 - insulin lispro (HUMALOG KWIKPEN) 200 UNIT/ML KwikPen; INJECT 50 UNITS SUBCUTANEOUSLY THREE TIMES DAILY BEFORE MEAL(S)  Dispense: 24 mL; Refill: 11 - CMP14+EGFR - CBC with Differential/Platelet  10. Hx of non-ST elevation myocardial infarction (NSTEMI) -  CMP14+EGFR - CBC with Differential/Platelet  11. Morbid obesity ([place]) - CMP14+EGFR - CBC with Differential/Platelet  12. S/P primary angioplasty with coronary stent - CMP14+EGFR - CBC with Differential/Platelet   Labs pending Health Maintenance reviewed Diet and exercise encouraged  Follow up plan: 3 months    Evelina Dun, FNP

## 2019-12-20 ENCOUNTER — Other Ambulatory Visit: Payer: Self-pay | Admitting: Cardiovascular Disease

## 2019-12-20 ENCOUNTER — Ambulatory Visit (INDEPENDENT_AMBULATORY_CARE_PROVIDER_SITE_OTHER): Payer: Medicare Other | Admitting: Pharmacist

## 2019-12-20 ENCOUNTER — Other Ambulatory Visit: Payer: Self-pay | Admitting: Family

## 2019-12-20 DIAGNOSIS — E119 Type 2 diabetes mellitus without complications: Secondary | ICD-10-CM

## 2019-12-20 DIAGNOSIS — D72829 Elevated white blood cell count, unspecified: Secondary | ICD-10-CM

## 2019-12-20 NOTE — Telephone Encounter (Signed)
Refill request

## 2019-12-20 NOTE — Progress Notes (Signed)
° ° °  12/20/2019 Name: Leah Ferrell MRN: [id number] DOB: 1950-08-11   S:  46 yoF Presents for diabetes evaluation, education, and management Patient was referred and last seen by Primary Care Provider on 12/19/19.  Insurance coverage/medication affordability: UHC medicare  Patient reports adherence with medications.  Current diabetes medications include: humalog  Current hypertension medications include: lisinopril, carvedilol, amlodipine, spironolactone Goal 130/80  Current hyperlipidemia medications include: rosuvastatin  Patient reports hypoglycemic events. ONLY ON MEAL TIME INSULIN, SO GOES LOW, DID NOT BRING BG LOG   Patient reported dietary habits: Eats 3 meals/day   Breakfast: EGGS, RICE, OKRA   Lunch: OKRA SOUP, FISH    Dinner:  LEFTOVERS, USUALLY SOUPS, RICE   Snacks:N/A   Drinks:WATER  Patient-reported exercise habits: UNABLE TO DUE TO LEG PAIN  Patient reports self foot exams.    O:  Lab Results  Component Value Date   HGBA1C 8.6 (H) 12/19/2019    Lipid Panel     Component Value Date/Time   CHOL 180 09/13/2019 1330   TRIG 107 09/13/2019 1330   TRIG 135 09/16/2012 1322   HDL 79 09/13/2019 1330   HDL 60 09/16/2012 1322   CHOLHDL 2.3 09/13/2019 1330   CHOLHDL 3.2 05/06/2012 1713   VLDL 32 05/06/2012 1713   LDLCALC 82 09/13/2019 1330   LDLCALC 92 09/16/2012 1322    Home fasting blood sugars: 200S  2 hour post-meal/random blood sugars: n/a.   A/P:  Diabetes T2DM2 currently uncontrolled. Patient is able to verbalize appropriate hypoglycemia management plan. Patient is adherent with medication. Control is suboptimal due to current regimen/diet.  -New start GLP1 -->OZEMPIC (dulaglutide)  Sample given--> [id number], EXP10/23  Only on meal time insulin per patient request   Patient does not want to transition to basal yet.  She would like to finish out her humalog  Patient assistance filled out for novo nordisk patient assistance program     -Will transition patient to tresiba at next visit and work to reduce to meal time insulin  -Extensively discussed pathophysiology of diabetes, recommended lifestyle interventions, dietary effects on blood sugar control  -Counseled on s/sx of and management of hypoglycemia  -Next A1C anticipated 3 months.  Written patient instructions provided.  Total time in face to face counseling 30 minutes.   Follow up Pharmacist Clinic Visit ON 01/17/20.   Regina Eck, PharmD, BCPS Clinical Pharmacist, [place]  II Phone [phone]

## 2019-12-29 DIAGNOSIS — H25812 Combined forms of age-related cataract, left eye: Secondary | ICD-10-CM | POA: Diagnosis not present

## 2019-12-29 DIAGNOSIS — H26491 Other secondary cataract, right eye: Secondary | ICD-10-CM | POA: Diagnosis not present

## 2019-12-29 DIAGNOSIS — Z961 Presence of intraocular lens: Secondary | ICD-10-CM | POA: Diagnosis not present

## 2019-12-29 DIAGNOSIS — E113391 Type 2 diabetes mellitus with moderate nonproliferative diabetic retinopathy without macular edema, right eye: Secondary | ICD-10-CM | POA: Diagnosis not present

## 2019-12-29 LAB — HM DIABETES EYE EXAM

## 2020-01-05 DIAGNOSIS — S92534A Nondisplaced fracture of distal phalanx of right lesser toe(s), initial encounter for closed fracture: Secondary | ICD-10-CM | POA: Diagnosis not present

## 2020-01-05 DIAGNOSIS — M79676 Pain in unspecified toe(s): Secondary | ICD-10-CM | POA: Diagnosis not present

## 2020-01-09 ENCOUNTER — Other Ambulatory Visit: Payer: Self-pay | Admitting: Cardiovascular Disease

## 2020-01-09 DIAGNOSIS — E1159 Type 2 diabetes mellitus with other circulatory complications: Secondary | ICD-10-CM

## 2020-01-09 DIAGNOSIS — I152 Hypertension secondary to endocrine disorders: Secondary | ICD-10-CM

## 2020-01-10 NOTE — Telephone Encounter (Signed)
Refill Request.  

## 2020-01-17 ENCOUNTER — Ambulatory Visit (INDEPENDENT_AMBULATORY_CARE_PROVIDER_SITE_OTHER): Payer: Medicare Other | Admitting: Pharmacist

## 2020-01-17 ENCOUNTER — Other Ambulatory Visit: Payer: Self-pay

## 2020-01-17 DIAGNOSIS — E119 Type 2 diabetes mellitus without complications: Secondary | ICD-10-CM | POA: Diagnosis not present

## 2020-01-17 NOTE — Progress Notes (Signed)
    01/17/2020 Name: Leah Ferrell MRN: [id number] DOB: 01/30/1951   S:  42 yoF Presents for diabetes evaluation, education, and management Patient was referred and last seen by Primary Care Provider on 12/19/19.  Insurance coverage/medication affordability: UHC medicare  Patient reports adherence with medications.  Current diabetes medications include: humalog, ozempic  Current hypertension medications include: lisinopril, carvedilol, amlodipine, spironolactone Goal 130/80  Current hyperlipidemia medications include: rosuvastatin  Patient reports hypoglycemic events. ONLY ON MEAL TIME INSULIN, SO GOES LOW, DID NOT BRING BG LOG   Patient reported dietary habits: Eats 3 meals/day   Breakfast: EGGS, RICE, OKRA   Lunch: OKRA SOUP, FISH    Dinner:  LEFTOVERS, USUALLY SOUPS, RICE   Snacks:N/A   Drinks:WATER  Patient-reported exercise habits: UNABLE TO DUE TO LEG PAIN  Patient reports self foot exams.--examined feet today, wrapped right pinky toe and applied antibiotic ointment to site  O:  Lab Results  Component Value Date   HGBA1C 8.6 (H) 12/19/2019    There were no vitals filed for this visit.     Lipid Panel     Component Value Date/Time   CHOL 180 09/13/2019 1330   TRIG 107 09/13/2019 1330   TRIG 135 09/16/2012 1322   HDL 79 09/13/2019 1330   HDL 60 09/16/2012 1322   CHOLHDL 2.3 09/13/2019 1330   CHOLHDL 3.2 05/06/2012 1713   VLDL 32 05/06/2012 1713   LDLCALC 82 09/13/2019 1330   LDLCALC 92 09/16/2012 1322    Home fasting blood sugars: 200s, 209  2 hour post-meal/random blood sugars: N/A   A/P: Diabetes T2DM2 currently uncontrolled. Patient is able to verbalize appropriate hypoglycemia management plan. Patient is adherent with medication. Control is suboptimal due to current regimen/diet.  -Increase GLP -->OZEMPIC (semaglutide) to 0.5mg  sq weekly Tuesdays  Increase to 0.5mg              Only on meal time insulin per patient  request              Patient does not want to transition to basal yet.  She would like to continue current management             Patient assistance medications given to patient (Ozempic and [place] via patient [employer])  -Will work to transition patient to tresiba at next visit and work to reduce to meal time insulin; patient refuses basal insulin and does not wish to make any changes to regimen at this time  -Highly encouraged patient to reconsider  -Encouraged patient to check sugar often as she is giving large amounts of fast acting insulin   -Extensively discussed pathophysiology of diabetes, recommended lifestyle interventions, dietary effects on blood sugar control  -Extensively discussed pathophysiology of diabetes, recommended lifestyle interventions, dietary effects on blood sugar control  -Counseled on s/sx of and management of hypoglycemia  -Next A1C anticipated 02/2020.   Written patient instructions provided.  Total time in face to face counseling 30 minutes.   Follow up PCP Clinic Visit ON 03/20/20.   Regina Eck, PharmD, BCPS Clinical Pharmacist, [place]  II Phone [phone]

## 2020-01-18 ENCOUNTER — Other Ambulatory Visit: Payer: Self-pay | Admitting: Cardiovascular Disease

## 2020-01-18 NOTE — Telephone Encounter (Signed)
Refill Request.  

## 2020-01-23 ENCOUNTER — Other Ambulatory Visit: Payer: Self-pay | Admitting: Cardiovascular Disease

## 2020-01-23 NOTE — Telephone Encounter (Signed)
Refill Request.  

## 2020-01-25 ENCOUNTER — Telehealth: Payer: Self-pay

## 2020-01-25 MED ORDER — CEPHALEXIN 500 MG PO CAPS: 500.0000 mg | ORAL_CAPSULE | Freq: Three times a day (TID) | ORAL | 0 refills | Status: DC

## 2020-01-25 NOTE — Telephone Encounter (Signed)
Pt states that last time she was in the office she saw Almyra Free and during that visit the nurse applied cream and wrapped an area on her foot. She states that it is not getting better and would like an antibiotic because she has been running a fever and it is painful. Please review and advise

## 2020-01-25 NOTE — Telephone Encounter (Signed)
Keflex Prescription sent to pharmacy, she needs to be seen to look at this spot and to follow up.

## 2020-01-26 NOTE — Telephone Encounter (Signed)
Patient aware and verbalizes understanding. 

## 2020-02-01 ENCOUNTER — Other Ambulatory Visit: Payer: Self-pay | Admitting: Family

## 2020-02-03 ENCOUNTER — Other Ambulatory Visit: Payer: Self-pay | Admitting: Family

## 2020-02-07 ENCOUNTER — Other Ambulatory Visit: Payer: Self-pay | Admitting: Family

## 2020-02-08 ENCOUNTER — Other Ambulatory Visit: Payer: Self-pay | Admitting: Family

## 2020-02-09 ENCOUNTER — Other Ambulatory Visit: Payer: Self-pay | Admitting: *Deleted

## 2020-02-09 DIAGNOSIS — I739 Peripheral vascular disease, unspecified: Secondary | ICD-10-CM

## 2020-02-13 ENCOUNTER — Telehealth: Payer: Self-pay

## 2020-02-13 NOTE — Telephone Encounter (Signed)
Call returned to patient no answer, left VM

## 2020-02-14 ENCOUNTER — Telehealth: Payer: Self-pay | Admitting: Family Medicine

## 2020-02-14 NOTE — Telephone Encounter (Signed)
Please let patient know insulin Leah Ferrell) is in the refrigerator for pick up.  I have ordered additional Fiasp for her through patient assistance program.  Should arrive in January 2022.  I will call patient at that time

## 2020-02-14 NOTE — Telephone Encounter (Signed)
Lmtcb.

## 2020-02-14 NOTE — Telephone Encounter (Signed)
Aware. 

## 2020-02-16 ENCOUNTER — Other Ambulatory Visit: Payer: Self-pay | Admitting: Cardiovascular Disease

## 2020-02-16 NOTE — Telephone Encounter (Signed)
Refill request

## 2020-02-20 ENCOUNTER — Telehealth: Payer: Self-pay | Admitting: Family

## 2020-02-20 NOTE — Telephone Encounter (Signed)
More fiasp labeled in fridge for patient Informed patient that there a 300 units in each pen

## 2020-02-20 NOTE — Telephone Encounter (Signed)
Pt would like to speak to [place] about insulin

## 2020-02-21 ENCOUNTER — Telehealth: Payer: Self-pay

## 2020-02-24 ENCOUNTER — Ambulatory Visit: Payer: Self-pay | Admitting: Pharmacist

## 2020-02-28 ENCOUNTER — Ambulatory Visit: Payer: Medicare Other | Admitting: Pharmacist

## 2020-03-03 IMAGING — US US CAROTID DUPLEX BILAT
1 series · 13 of 24 positions shown · non-contrast
Comparison: None.

CLINICAL DATA: CAD. History of hypertension, hyperlipidemia and
diabetes. Former smoker.

EXAM:
BILATERAL CAROTID DUPLEX ULTRASOUND
TECHNIQUE: Gray scale imaging, color Doppler and duplex ultrasound were
performed of bilateral carotid and vertebral arteries in the neck.

[Series 1: us carotid duplex bilat · 0.06mm/px · 13 of 68 slices shown]
[im 1/68]
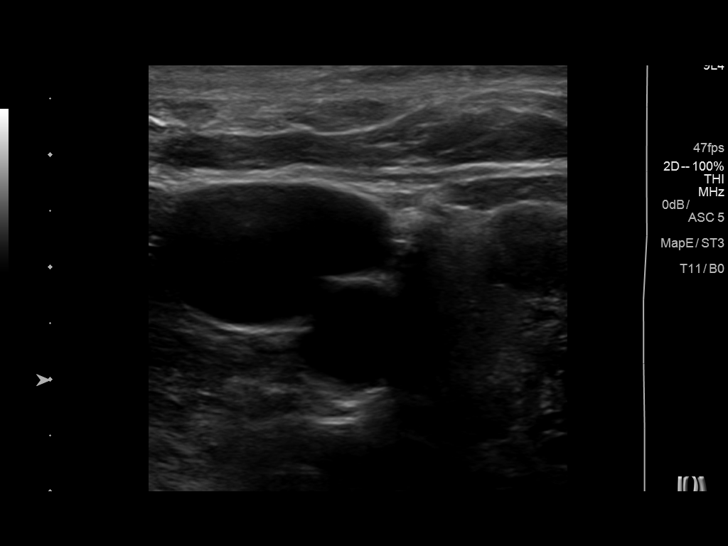
[im 6/68]
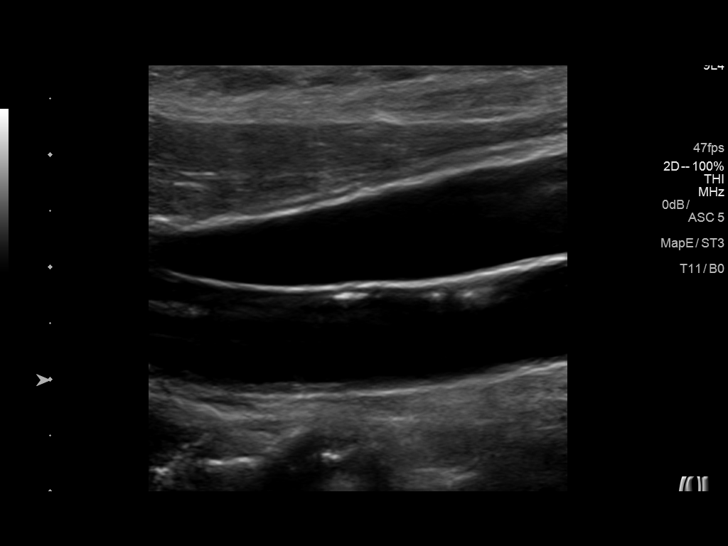
[im 12/68]
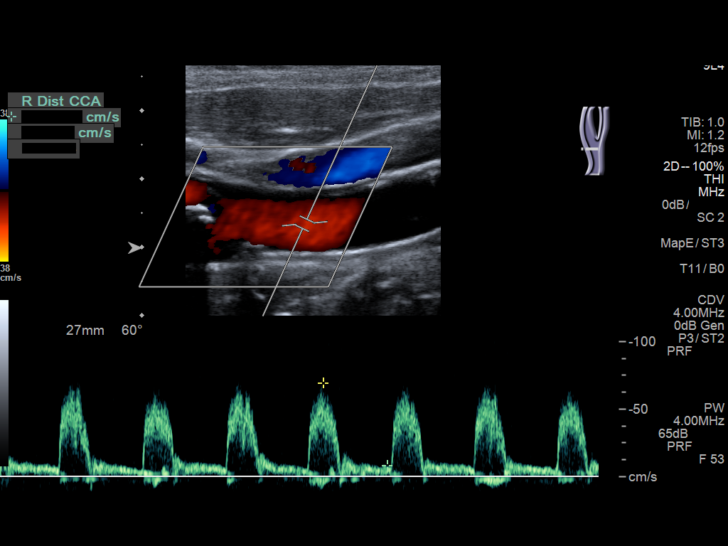
[im 18/68]
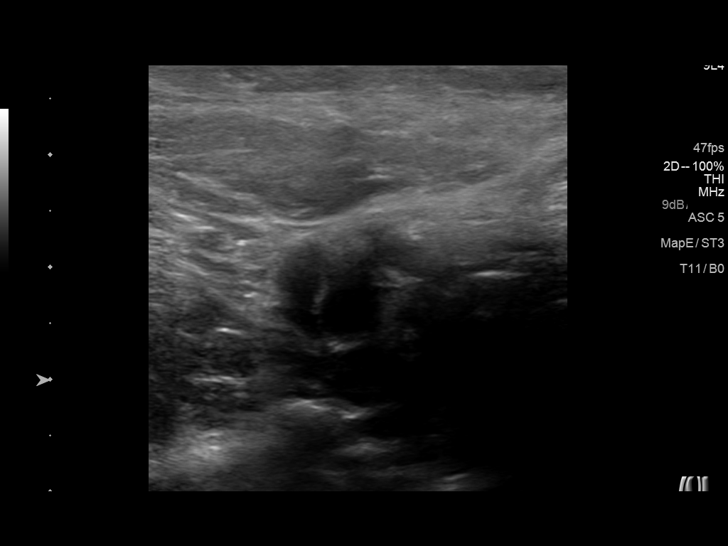
[im 24/68]
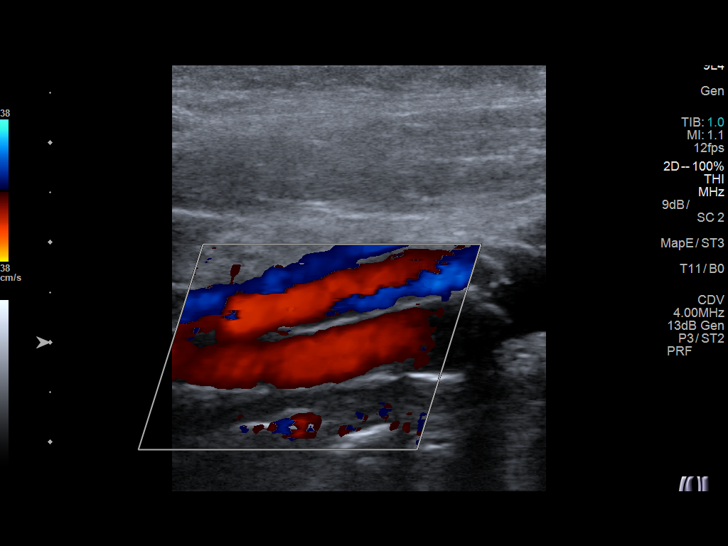
[im 30/68]
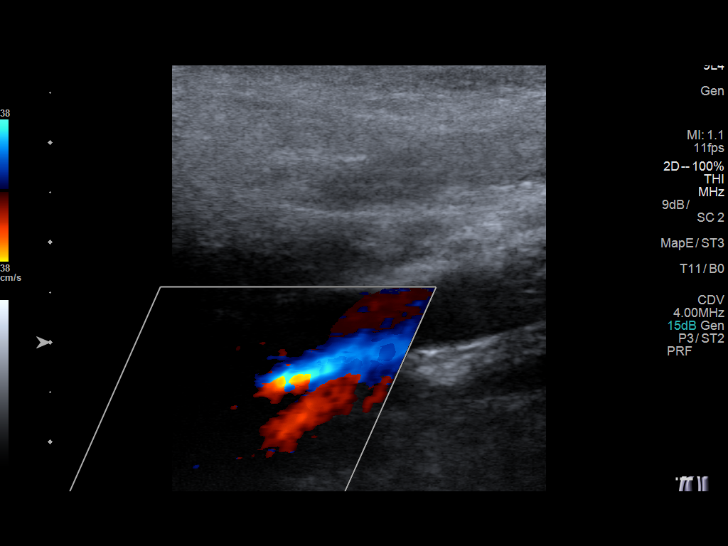
[im 35/68]
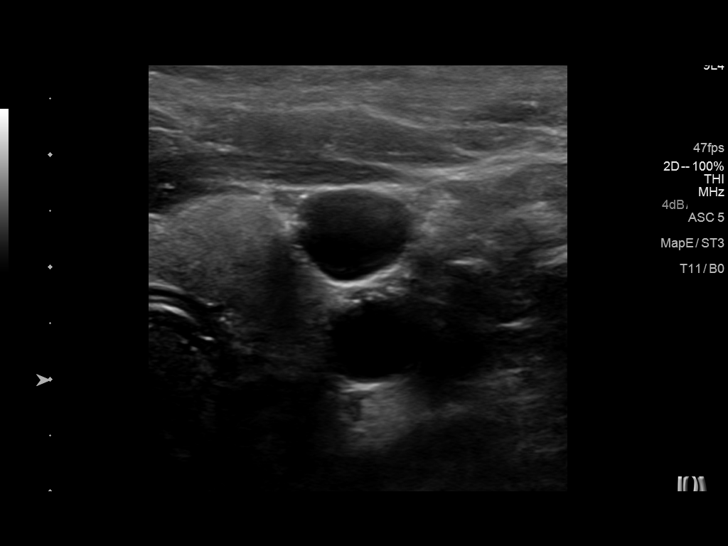
[im 38/68]
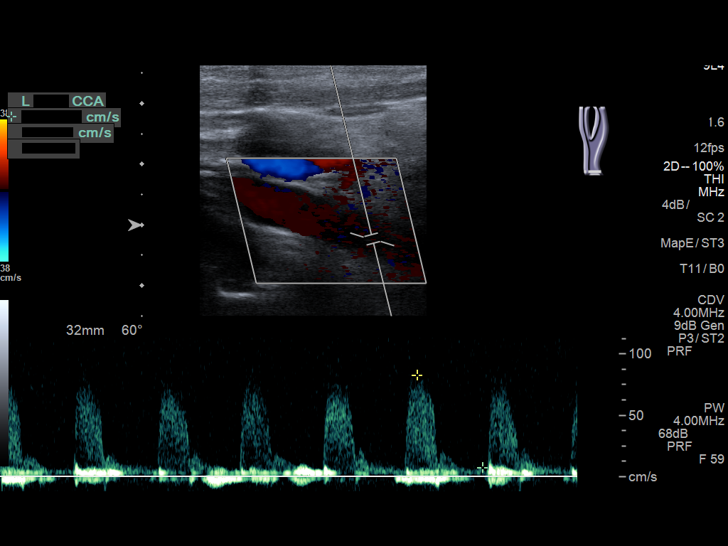
[im 44/68]
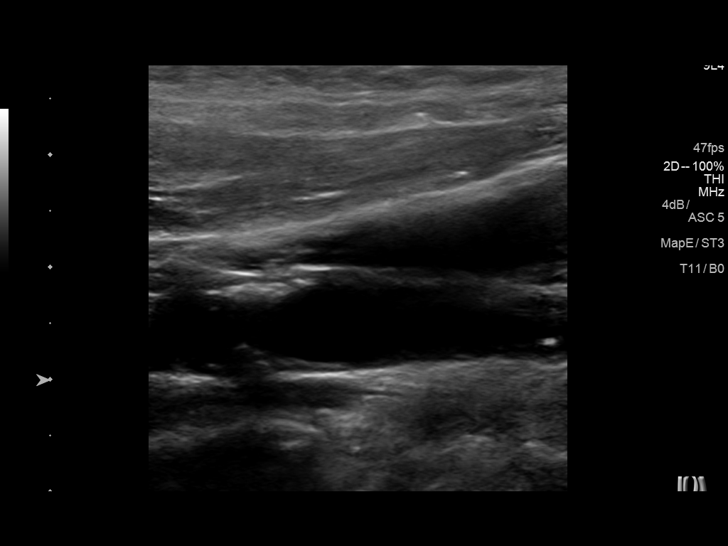
[im 50/68]
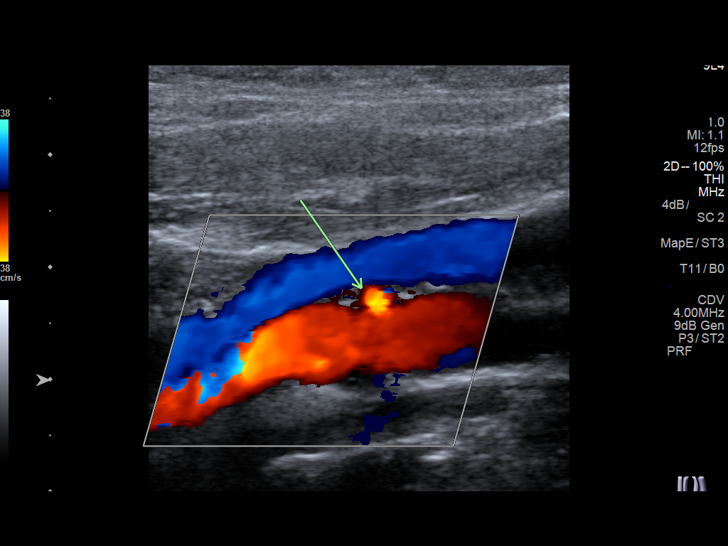
[im 56/68]
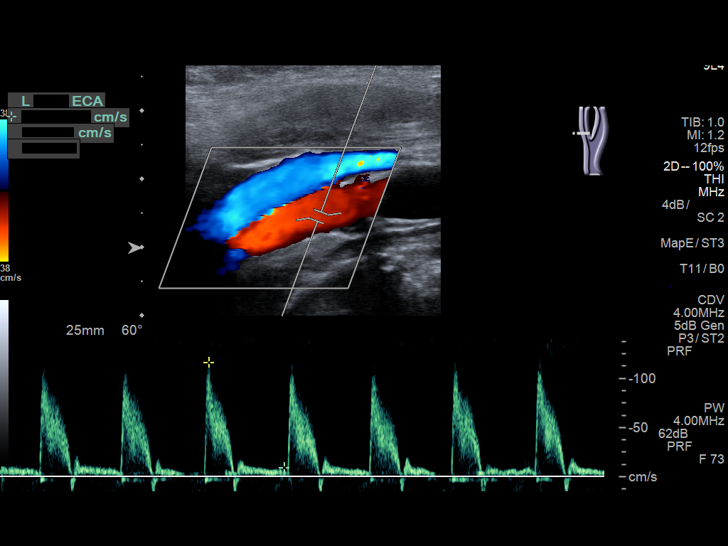
[im 62/68]
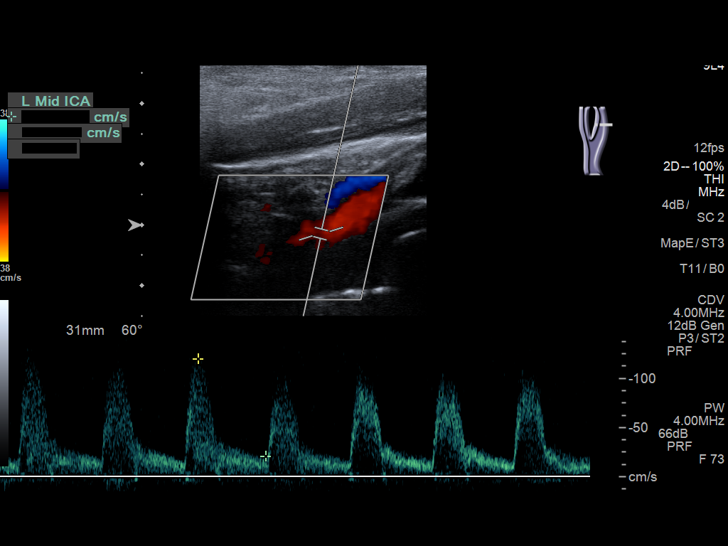
[im 68/68]
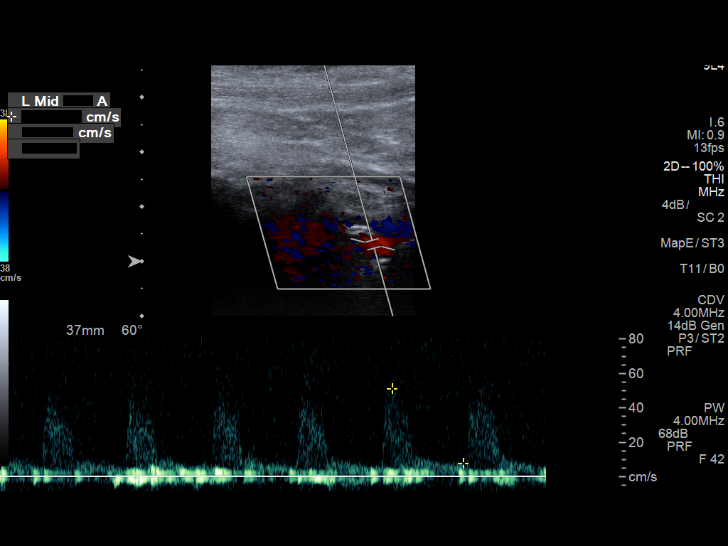

[13 of 24 positions shown; findings below may reference images not displayed]

FINDINGS: Criteria: Quantification of carotid stenosis is based on velocity
parameters that correlate the residual internal carotid diameter
with NASCET-based stenosis levels, using the diameter of the distal
internal carotid lumen as the denominator for stenosis measurement.

The following velocity measurements were obtained:

RIGHT

ICA:  113/23 cm/sec

CCA:  87/8 cm/sec

SYSTOLIC ICA/CCA RATIO:

ECA:  159 cm/sec

LEFT

ICA:  120/21 cm/sec

CCA:  85/6 cm/sec

SYSTOLIC ICA/CCA RATIO:

ECA:  116 cm/sec

RIGHT CAROTID ARTERY: There is a minimal amount of mixed echogenic
plaque within the mid (image 7) and distal (image 11) aspects of the
right common carotid artery. There is a moderate amount of mixed
echogenic plaque within the right carotid bulb (image 16), extending
to involve the origin and proximal aspects the right internal
carotid artery (image 24), not resulting in elevated peak systolic
velocities within the interrogated course the right internal carotid
artery to suggest a hemodynamically significant stenosis.

RIGHT VERTEBRAL ARTERY:  Antegrade flow

LEFT CAROTID ARTERY: There is a minimal amount echogenic plaque seen
throughout the left common carotid artery (images 37, 41 and 45).
There is a moderate to large amount of echogenic plaque within the
left carotid bulb (image 50), extending to involve the origin and
proximal aspect the left internal carotid artery (image 58),
resulting in borderline elevated peak systolic velocity within the
mid aspect of the left internal carotid artery. Greatest acquired
peak systolic velocity within mid left ICA measures 120
centimeters/second (image 63).

LEFT VERTEBRAL ARTERY:  Antegrade Flow
IMPRESSION: 1. Moderate to large amount of left-sided atherosclerotic plaque
results in borderline elevated peak systolic velocities within the
left internal carotid artery which approach the 50% luminal
narrowing range. Further evaluation with CTA could be performed as
clinically indicated.
2. Moderate amount of right-sided atherosclerotic plaque, not
definitely resulting in a hemodynamically significant stenosis.

## 2020-03-05 ENCOUNTER — Ambulatory Visit (INDEPENDENT_AMBULATORY_CARE_PROVIDER_SITE_OTHER): Payer: Medicare Other | Admitting: Pharmacist

## 2020-03-05 ENCOUNTER — Other Ambulatory Visit: Payer: Self-pay

## 2020-03-05 DIAGNOSIS — E119 Type 2 diabetes mellitus without complications: Secondary | ICD-10-CM | POA: Diagnosis not present

## 2020-03-05 NOTE — Progress Notes (Signed)
    03/05/2020 Name: Leah Ferrell MRN: [id number] DOB: 1950-05-11   S:  89 YOF Presents for diabetes evaluation, education, and management Patient was referred and last seen by Primary Care Provider on 12/19/19.  Patient visit virtually in the context of Covid-19 pandemic.   I connected with [name]  on 03/05/20 at 11AM by video/telephone and verified that I am speaking with the correct person using two identifiers.   I discussed the limitations, risks, security and privacy concerns of performing an evaluation and management service by video and the availability of in person appointments. I also discussed with the patient that there may be a patient responsible charge related to this service. The patient expressed understanding and agreed to proceed.   Patient location:  HOME My Location:  HOME Persons on the video call:  2   Insurance coverage/medication affordability: [place]  Patient reports adherence with medications. . Current diabetes medications include: HUMALOG, OZEMPIC (REFUSES BASAL INSULIN)  Current hypertension medications include: lisinopril, carvedilol, amlodipine, spironolactone Goal 130/80 . Current hyperlipidemia medications include: ROSUVASTATIN   Patient denies hypoglycemic events.  Patient-reported exercise habits:UNABLE TO DUE TO LEG PAIN  O:  Lab Results  Component Value Date   HGBA1C 8.6 (H) 12/19/2019    There were no vitals filed for this visit.   Lipid Panel     Component Value Date/Time   CHOL 180 09/13/2019 1330   TRIG 107 09/13/2019 1330   TRIG 135 09/16/2012 1322   HDL 79 09/13/2019 1330   HDL 60 09/16/2012 1322   CHOLHDL 2.3 09/13/2019 1330   CHOLHDL 3.2 05/06/2012 1713   VLDL 32 05/06/2012 1713   LDLCALC 82 09/13/2019 1330   LDLCALC 92 09/16/2012 1322   Home fasting blood sugars: <180  2 hour post-meal/random blood sugars: N/A.   Clinical Atherosclerotic Cardiovascular Disease (ASCVD): Yes   The ASCVD Risk  score Mikey Bussing DC Jr., et al., 2013) failed to calculate for the following reasons:   The patient has a prior MI or stroke diagnosis    A/P:  Diabetes T2DM currently uncontrolled. Patient is able to verbalize appropriate hypoglycemia management plan. Patient is adherent with medication. Control is suboptimal due to medication regimen--patient could really benefit from basal insulin  Humalog to 40 units with meals  -CONTINUE GLP1 --> OZEMPIC(semaglutide) 0.5mg  sq weekly Tuesdays             Only on meal time insulin per patient request Patient does not want to transition to basal yet. She would like to continue current management Patient assistance medications given to patient (Ozempic and [place] via patient [employer])  -Will continue to work to transition patient to tresiba (basal insulin) and work to reduce to meal time insulin; patient refuses basal insulin and does not wish to make any changes to regimen at this time  -Extensively discussed pathophysiology of diabetes, recommended lifestyle interventions, dietary effects on blood sugar control  -Counseled on s/sx of and management of hypoglycemia  -Next A1C anticipated 03/20/20.  Written patient instructions provided.  Total time in counseling 20 minutes.   Follow up PCP Clinic Visit ON 03/20/20.   Regina Eck, PharmD, BCPS Clinical Pharmacist, [place]  II Phone [phone]

## 2020-03-07 NOTE — Telephone Encounter (Signed)
   Spoke with patient re: Insulin  Her NovoNordisk patient assistance program shipment should arrive to PCP office in 10-14 business days  She is on Ozempic and Fiasp (meal time)  Patient does not wish to start basal insulin at this time; we are working towards this goal to reduce meal time insulin/avoid potential hypoglycemia/avoid extremes in BG readings

## 2020-03-08 ENCOUNTER — Other Ambulatory Visit: Payer: Self-pay | Admitting: Family

## 2020-03-08 DIAGNOSIS — I2510 Atherosclerotic heart disease of native coronary artery without angina pectoris: Secondary | ICD-10-CM

## 2020-03-08 DIAGNOSIS — I5031 Acute diastolic (congestive) heart failure: Secondary | ICD-10-CM

## 2020-03-08 DIAGNOSIS — I739 Peripheral vascular disease, unspecified: Secondary | ICD-10-CM

## 2020-03-08 DIAGNOSIS — E1151 Type 2 diabetes mellitus with diabetic peripheral angiopathy without gangrene: Secondary | ICD-10-CM

## 2020-03-08 MED ORDER — CLOPIDOGREL BISULFATE 75 MG PO TABS
75.0000 mg | ORAL_TABLET | Freq: Every day | ORAL | 0 refills | Status: DC
Start: 2020-03-08 — End: 2020-03-20

## 2020-03-08 NOTE — Telephone Encounter (Signed)
  Prescription Request  03/08/2020  What is the name of the medication or equipment? Clopidogrel 75 mg  Have you contacted your pharmacy to request a refill? (if applicable) yes  Which pharmacy would you like this sent to? [facility] in [place]    Patient notified that their request is being sent to the clinical staff for review and that they should receive a response within 2 business days.    *Needs to be approved by Evelina Dun*

## 2020-03-08 NOTE — Telephone Encounter (Signed)
Requested Rx was last filled on 01/24/19 #90 Last OV 12/19/19 & Next OV 03/20/20 Please advise on refill

## 2020-03-20 ENCOUNTER — Other Ambulatory Visit: Payer: Self-pay

## 2020-03-20 ENCOUNTER — Encounter: Payer: Self-pay | Admitting: Family

## 2020-03-20 ENCOUNTER — Ambulatory Visit (INDEPENDENT_AMBULATORY_CARE_PROVIDER_SITE_OTHER): Payer: Medicare Other | Admitting: Family

## 2020-03-20 VITALS — BP 151/57 | HR 77 | Temp 97.4°F | Ht 62.5 in | Wt 206.8 lb

## 2020-03-20 DIAGNOSIS — E118 Type 2 diabetes mellitus with unspecified complications: Secondary | ICD-10-CM | POA: Diagnosis not present

## 2020-03-20 DIAGNOSIS — I2510 Atherosclerotic heart disease of native coronary artery without angina pectoris: Secondary | ICD-10-CM

## 2020-03-20 DIAGNOSIS — E785 Hyperlipidemia, unspecified: Secondary | ICD-10-CM

## 2020-03-20 DIAGNOSIS — I5031 Acute diastolic (congestive) heart failure: Secondary | ICD-10-CM

## 2020-03-20 DIAGNOSIS — E1169 Type 2 diabetes mellitus with other specified complication: Secondary | ICD-10-CM

## 2020-03-20 DIAGNOSIS — E1159 Type 2 diabetes mellitus with other circulatory complications: Secondary | ICD-10-CM

## 2020-03-20 DIAGNOSIS — I739 Peripheral vascular disease, unspecified: Secondary | ICD-10-CM

## 2020-03-20 DIAGNOSIS — I152 Hypertension secondary to endocrine disorders: Secondary | ICD-10-CM | POA: Diagnosis not present

## 2020-03-20 DIAGNOSIS — I252 Old myocardial infarction: Secondary | ICD-10-CM

## 2020-03-20 DIAGNOSIS — I5032 Chronic diastolic (congestive) heart failure: Secondary | ICD-10-CM

## 2020-03-20 DIAGNOSIS — E1151 Type 2 diabetes mellitus with diabetic peripheral angiopathy without gangrene: Secondary | ICD-10-CM | POA: Diagnosis not present

## 2020-03-20 DIAGNOSIS — Z955 Presence of coronary angioplasty implant and graft: Secondary | ICD-10-CM

## 2020-03-20 LAB — BAYER DCA HB A1C WAIVED: HB A1C (BAYER DCA - WAIVED): 8.7 % — ABNORMAL HIGH (ref <7.0)

## 2020-03-20 MED ORDER — ROSUVASTATIN CALCIUM 40 MG PO TABS: 40.0000 mg | ORAL_TABLET | Freq: Every day | ORAL | 3 refills | Status: DC

## 2020-03-20 MED ORDER — LISINOPRIL 20 MG PO TABS: 20.0000 mg | ORAL_TABLET | Freq: Two times a day (BID) | ORAL | 5 refills | Status: DC

## 2020-03-20 MED ORDER — CLOPIDOGREL BISULFATE 75 MG PO TABS: 75.0000 mg | ORAL_TABLET | Freq: Every day | ORAL | 0 refills | Status: DC

## 2020-03-20 MED ORDER — MONTELUKAST SODIUM 10 MG PO TABS: ORAL_TABLET | ORAL | 2 refills | Status: DC

## 2020-03-20 MED ORDER — CEPHALEXIN 500 MG PO CAPS: 500.0000 mg | ORAL_CAPSULE | Freq: Three times a day (TID) | ORAL | 0 refills | Status: DC

## 2020-03-20 MED ORDER — FIASP FLEXTOUCH 100 UNIT/ML ~~LOC~~ SOPN: PEN_INJECTOR | SUBCUTANEOUS | 2 refills | Status: DC

## 2020-03-20 MED ORDER — SPIRONOLACTONE 25 MG PO TABS: 25.0000 mg | ORAL_TABLET | Freq: Every day | ORAL | 3 refills | Status: DC

## 2020-03-20 MED ORDER — AMLODIPINE BESYLATE 5 MG PO TABS: 5.0000 mg | ORAL_TABLET | Freq: Every day | ORAL | 2 refills | Status: DC

## 2020-03-20 MED ORDER — CARVEDILOL 6.25 MG PO TABS: 6.2500 mg | ORAL_TABLET | Freq: Two times a day (BID) | ORAL | 2 refills | Status: DC

## 2020-03-20 NOTE — Progress Notes (Signed)
Subjective:    Patient ID: Leah Ferrell, female    DOB: [birth year], 70 y.o.   MRN: [id number]  Chief Complaint  Patient presents with  . Diabetes  . Hyperlipidemia   Pt presents to the office today for chronic follow up. Pt has CHF, CAD and hx MI and is followed by Cardiologists every 6 months. PT is followed by Vascular for PVD and PAD. PT states these are stable.  [place] renal angiography on 03/16/19. She reports her swelling is improved and her BP is improved.  Pt complaining of right fifth toe erythemas and mild swelling. Denies any discharge or fever.  Diabetes She presents for her follow-up diabetic visit. She has type 2 diabetes mellitus. Her disease course has been stable. There are no hypoglycemic associated symptoms. Associated symptoms include blurred vision and foot paresthesias. There are no hypoglycemic complications. Symptoms are stable. Diabetic complications include nephropathy and peripheral neuropathy. Pertinent negatives for diabetic complications include no CVA. Risk factors for coronary artery disease include dyslipidemia, diabetes mellitus, hypertension and sedentary lifestyle. She is following a generally unhealthy diet. Her overall blood glucose range is 140-180 mg/dl. An ACE inhibitor/angiotensin II receptor blocker is being taken. Eye exam is current.  Hyperlipidemia This is a chronic problem. The current episode started more than 1 year ago. The problem is controlled. Recent lipid tests were reviewed and are normal. Exacerbating diseases include obesity. Current antihyperlipidemic treatment includes statins. The current treatment provides moderate improvement of lipids. Risk factors for coronary artery disease include dyslipidemia, diabetes mellitus, hypertension, post-menopausal and a sedentary lifestyle.      Review of Systems  Eyes: Positive for blurred vision.  All other systems reviewed and are negative.      Objective:   Physical  Exam Vitals reviewed.  Constitutional:      General: She is not in acute distress.    Appearance: She is well-developed and well-nourished. She is obese.  HENT:     Head: Normocephalic and atraumatic.     Right Ear: Tympanic membrane normal.     Left Ear: Tympanic membrane normal.     Mouth/Throat:     Mouth: Oropharynx is clear and moist.  Eyes:     Pupils: Pupils are equal, round, and reactive to light.  Neck:     Thyroid: No thyromegaly.  Cardiovascular:     Rate and Rhythm: Normal rate and regular rhythm.     Pulses: Intact distal pulses.     Heart sounds: Normal heart sounds. No murmur heard.   Pulmonary:     Effort: Pulmonary effort is normal. No respiratory distress.     Breath sounds: Normal breath sounds. No wheezing.  Abdominal:     General: Bowel sounds are normal. There is no distension.     Palpations: Abdomen is soft.     Tenderness: There is no abdominal tenderness.  Musculoskeletal:        General: No tenderness or edema. Normal range of motion.     Cervical back: Normal range of motion and neck supple.     Right lower leg: No edema.     Left lower leg: No edema.  Skin:    General: Skin is warm and dry.     Findings: Erythema present.     Comments: Fifth metatarsal mildly erythemas.   Neurological:     Mental Status: She is alert and oriented to person, place, and time.     Cranial Nerves: No cranial nerve deficit.  Deep Tendon Reflexes: Reflexes are normal and symmetric.  Psychiatric:        Mood and Affect: Mood and affect normal.        Behavior: Behavior normal.        Thought Content: Thought content normal.        Judgment: Judgment normal.       BP (!) 151/57   Pulse 77   Temp (!) 97.4 F (36.3 C) (Temporal)   Ht 5' 2.5" (1.588 m)   Wt 206 lb 12.8 oz (93.8 kg)   BMI 37.22 kg/m      Assessment & Plan:  Leah Ferrell comes in today with chief complaint of Diabetes and Hyperlipidemia   Diagnosis and orders addressed:  1.  Coronary artery disease involving native coronary artery of native heart without angina pectoris - CMP14+EGFR - CBC with Differential/Platelet - clopidogrel (PLAVIX) 75 MG tablet; Take 1 tablet (75 mg total) by mouth daily.  Dispense: 90 tablet; Refill: 0  2. Chronic diastolic heart failure (HCC) - CMP14+EGFR - CBC with Differential/Platelet  3. Diabetes mellitus with peripheral vascular disease (HCC) - Bayer DCA Hb A1c Waived - CMP14+EGFR - CBC with Differential/Platelet - Microalbumin / creatinine urine ratio - clopidogrel (PLAVIX) 75 MG tablet; Take 1 tablet (75 mg total) by mouth daily.  Dispense: 90 tablet; Refill: 0  4. Hypertension associated with diabetes ([place]) - CMP14+EGFR - CBC with Differential/Platelet - lisinopril (ZESTRIL) 20 MG tablet; Take 1 tablet (20 mg total) by mouth 2 (two) times daily.  Dispense: 60 tablet; Refill: 5 - amLODipine (NORVASC) 5 MG tablet; Take 1 tablet (5 mg total) by mouth daily.  Dispense: 90 tablet; Refill: 2  5. PAD (peripheral artery disease) (HCC) - CMP14+EGFR - CBC with Differential/Platelet  6. PVD (peripheral vascular disease) (HCC) - CMP14+EGFR - CBC with Differential/Platelet - clopidogrel (PLAVIX) 75 MG tablet; Take 1 tablet (75 mg total) by mouth daily.  Dispense: 90 tablet; Refill: 0  7. Hyperlipidemia associated with type 2 diabetes mellitus (HCC) - CMP14+EGFR - CBC with Differential/Platelet - Lipid panel - rosuvastatin (CRESTOR) 40 MG tablet; Take 1 tablet (40 mg total) by mouth daily.  Dispense: 90 tablet; Refill: 3  8. Hx of non-ST elevation myocardial infarction (NSTEMI) - CMP14+EGFR - CBC with Differential/Platelet  9. Morbid obesity ([place]) - CMP14+EGFR - CBC with Differential/Platelet  10. S/P primary angioplasty with coronary stent - CMP14+EGFR - CBC with Differential/Platelet  11. 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  Dispense: 30 capsule; Refill: 0  12. Acute diastolic CHF (congestive heart failure) (HCC) - clopidogrel (PLAVIX) 75 MG tablet; Take 1 tablet (75 mg total) by mouth daily.  Dispense: 90 tablet; Refill: 0   Labs pending Health Maintenance reviewed Diet and exercise encouraged  Follow up plan: 3 months    Evelina Dun, FNP

## 2020-03-20 NOTE — Patient Instructions (Signed)
Diabetes Mellitus and Foot Care Foot care is an important part of your health, especially when you have diabetes. Diabetes may cause you to have problems because of poor blood flow (circulation) to your feet and legs, which can cause your skin to:  Become thinner and drier.  Break more easily.  Heal more slowly.  Peel and crack. You may also have nerve damage (neuropathy) in your legs and feet, causing decreased feeling in them. This means that you may not notice minor injuries to your feet that could lead to more serious problems. Noticing and addressing any potential problems early is the best way to prevent future foot problems. How to care for your feet Foot hygiene  Wash your feet daily with warm water and mild soap. Do not use hot water. Then, pat your feet and the areas between your toes until they are completely dry. Do not soak your feet as this can dry your skin.  Trim your toenails straight across. Do not dig under them or around the cuticle. File the edges of your nails with an emery board or nail file.  Apply a moisturizing lotion or petroleum jelly to the skin on your feet and to dry, brittle toenails. Use lotion that does not contain alcohol and is unscented. Do not apply lotion between your toes.   Shoes and socks  Wear clean socks or stockings every day. Make sure they are not too tight. Do not wear knee-high stockings since they may decrease blood flow to your legs.  Wear shoes that fit properly and have enough cushioning. Always look in your shoes before you put them on to be sure there are no objects inside.  To break in new shoes, wear them for just a few hours a day. This prevents injuries on your feet. Wounds, scrapes, corns, and calluses  Check your feet daily for blisters, cuts, bruises, sores, and redness. If you cannot see the bottom of your feet, use a mirror or ask someone for help.  Do not cut corns or calluses or try to remove them with medicine.  If you  find a minor scrape, cut, or break in the skin on your feet, keep it and the skin around it clean and dry. You may clean these areas with mild soap and water. Do not clean the area with peroxide, alcohol, or iodine.  If you have a wound, scrape, corn, or callus on your foot, look at it several times a day to make sure it is healing and not infected. Check for: ? Redness, swelling, or pain. ? Fluid or blood. ? Warmth. ? Pus or a bad smell.   General tips  Do not cross your legs. This may decrease blood flow to your feet.  Do not use heating pads or hot water bottles on your feet. They may burn your skin. If you have lost feeling in your feet or legs, you may not know this is happening until it is too late.  Protect your feet from hot and cold by wearing shoes, such as at the beach or on hot pavement.  Schedule a complete foot exam at least once a year (annually) or more often if you have foot problems. Report any cuts, sores, or bruises to your health care provider immediately. Where to find more information  American Diabetes Association: www.diabetes.org  Association of Diabetes Care & Education Specialists: www.diabeteseducator.org Contact a health care provider if:  You have a medical condition that increases your risk of infection and   you have any cuts, sores, or bruises on your feet.  You have an injury that is not healing.  You have redness on your legs or feet.  You feel burning or tingling in your legs or feet.  You have pain or cramps in your legs and feet.  Your legs or feet are numb.  Your feet always feel cold.  You have pain around any toenails. Get help right away if:  You have a wound, scrape, corn, or callus on your foot and: ? You have pain, swelling, or redness that gets worse. ? You have fluid or blood coming from the wound, scrape, corn, or callus. ? Your wound, scrape, corn, or callus feels warm to the touch. ? You have pus or a bad smell coming from  the wound, scrape, corn, or callus. ? You have a fever. ? You have a red line going up your leg. Summary  Check your feet every day for blisters, cuts, bruises, sores, and redness.  Apply a moisturizing lotion or petroleum jelly to the skin on your feet and to dry, brittle toenails.  Wear shoes that fit properly and have enough cushioning.  If you have foot problems, report any cuts, sores, or bruises to your health care provider immediately.  Schedule a complete foot exam at least once a year (annually) or more often if you have foot problems. This information is not intended to replace advice given to you by your health care provider. Make sure you discuss any questions you have with your health care provider. Document Revised: 08/25/2019 Document Reviewed: 08/25/2019 Elsevier Patient Education  2021 Elsevier Inc.  

## 2020-03-21 ENCOUNTER — Other Ambulatory Visit: Payer: Self-pay | Admitting: Cardiovascular Disease

## 2020-03-21 LAB — CMP14+EGFR
ALT: 32 IU/L (ref 0–32)
AST: 17 IU/L (ref 0–40)
Albumin/Globulin Ratio: 1.2 (ref 1.2–2.2)
Albumin: 4.1 g/dL (ref 3.8–4.8)
Alkaline Phosphatase: 58 IU/L (ref 44–121)
BUN/Creatinine Ratio: 21 (ref 12–28)
BUN: 17 mg/dL (ref 8–27)
Bilirubin Total: 0.2 mg/dL (ref 0.0–1.2)
CO2: 25 mmol/L (ref 20–29)
Calcium: 9.3 mg/dL (ref 8.7–10.3)
Chloride: 94 mmol/L — ABNORMAL LOW (ref 96–106)
Creatinine, Ser: 0.8 mg/dL (ref 0.57–1.00)
GFR calc Af Amer: 88 mL/min/{1.73_m2} (ref >59)
GFR calc non Af Amer: 76 mL/min/{1.73_m2} (ref >59)
Globulin, Total: 3.3 g/dL (ref 1.5–4.5)
Glucose: 190 mg/dL — ABNORMAL HIGH (ref 65–99)
Potassium: 4.8 mmol/L (ref 3.5–5.2)
Sodium: 132 mmol/L — ABNORMAL LOW (ref 134–144)
Total Protein: 7.4 g/dL (ref 6.0–8.5)

## 2020-03-21 LAB — CBC WITH DIFFERENTIAL/PLATELET
Basophils Absolute: 0 10*3/uL (ref 0.0–0.2)
Basos: 0 %
EOS (ABSOLUTE): 0.2 10*3/uL (ref 0.0–0.4)
Eos: 2 %
Hematocrit: 42.1 % (ref 34.0–46.6)
Hemoglobin: 14 g/dL (ref 11.1–15.9)
Immature Grans (Abs): 0 10*3/uL (ref 0.0–0.1)
Immature Granulocytes: 0 %
Lymphocytes Absolute: 3.4 10*3/uL — ABNORMAL HIGH (ref 0.7–3.1)
Lymphs: 27 %
MCH: 31.2 pg (ref 26.6–33.0)
MCHC: 33.3 g/dL (ref 31.5–35.7)
MCV: 94 fL (ref 79–97)
Monocytes Absolute: 0.6 10*3/uL (ref 0.1–0.9)
Monocytes: 5 %
Neutrophils Absolute: 8 10*3/uL — ABNORMAL HIGH (ref 1.4–7.0)
Neutrophils: 66 %
Platelets: 325 10*3/uL (ref 150–450)
RBC: 4.49 x10E6/uL (ref 3.77–5.28)
RDW: 13.6 % (ref 11.7–15.4)
WBC: 12.3 10*3/uL — ABNORMAL HIGH (ref 3.4–10.8)

## 2020-03-21 LAB — LIPID PANEL
Chol/HDL Ratio: 2.5 ratio (ref 0.0–4.4)
Cholesterol, Total: 150 mg/dL (ref 100–199)
HDL: 60 mg/dL (ref >39)
LDL Chol Calc (NIH): 66 mg/dL (ref 0–99)
Triglycerides: 137 mg/dL (ref 0–149)
VLDL Cholesterol Cal: 24 mg/dL (ref 5–40)

## 2020-03-21 LAB — MICROALBUMIN / CREATININE URINE RATIO
Creatinine, Urine: 65.7 mg/dL
Microalb/Creat Ratio: 49 mg/g creat — ABNORMAL HIGH (ref 0–29)
Microalbumin, Urine: 32.2 ug/mL

## 2020-03-21 NOTE — Telephone Encounter (Signed)
Rx request sent to pharmacy.  

## 2020-03-23 ENCOUNTER — Other Ambulatory Visit: Payer: Self-pay | Admitting: Family

## 2020-03-23 MED ORDER — OZEMPIC (0.25 OR 0.5 MG/DOSE) 2 MG/1.5ML ~~LOC~~ SOPN: 1.0000 mg | PEN_INJECTOR | SUBCUTANEOUS | 3 refills | Status: DC

## 2020-03-28 ENCOUNTER — Telehealth: Payer: Self-pay | Admitting: Family

## 2020-03-28 NOTE — Telephone Encounter (Signed)
Pt calling to check on samples of Ozempic, supposed to be coming by to pick up some

## 2020-03-28 NOTE — Telephone Encounter (Signed)
Returning call from [place] about [employer]

## 2020-03-28 NOTE — Telephone Encounter (Signed)
#  5 Ozempic in fridge for pt to pick up   Aware - will come tomorrow to get.

## 2020-03-28 NOTE — Telephone Encounter (Signed)
LM for pt to call me = looks like she got pt assistance meds on 03/05/20 per Almyra Free note - will clarify what she still needs.

## 2020-03-28 NOTE — Telephone Encounter (Signed)
[facility] note-    #5 [place] placed in fridge for pt to pick up = name on it and pt aware

## 2020-03-28 NOTE — Telephone Encounter (Signed)
Pt rc for [employer]

## 2020-03-30 ENCOUNTER — Telehealth: Payer: Self-pay | Admitting: *Deleted

## 2020-03-30 NOTE — Telephone Encounter (Signed)
Prior auth department has been called. It has now gone to review with clinical pharmacist.

## 2020-03-30 NOTE — Telephone Encounter (Signed)
PA in process  (Key: [id number]) Rx #: [id number] Fiasp FlexTouch 100UNIT/ML pen-injectors

## 2020-03-30 NOTE — Telephone Encounter (Signed)
Reference # [id number] ask for [facility]

## 2020-04-02 ENCOUNTER — Other Ambulatory Visit: Payer: Self-pay | Admitting: *Deleted

## 2020-04-02 MED ORDER — MONTELUKAST SODIUM 10 MG PO TABS: ORAL_TABLET | ORAL | 2 refills | Status: DC

## 2020-04-02 MED ORDER — "INSULIN SYRINGE 27G X 1/2"" 0.5 ML MISC": 3 refills | Status: AC

## 2020-04-02 NOTE — Addendum Note (Signed)
Addended by: Antonietta Barcelona D on: 04/02/2020 11:37 AM   Modules accepted: Orders

## 2020-04-02 NOTE — Telephone Encounter (Signed)
Fiasp FlexTouch 100UNIT/ML pen-injectors - DENIED by insurance  Pt will continued getting this from pt assistance  Samples given to get her through until it comes in.

## 2020-04-12 DIAGNOSIS — I70235 Atherosclerosis of native arteries of right leg with ulceration of other part of foot: Secondary | ICD-10-CM | POA: Diagnosis not present

## 2020-04-12 DIAGNOSIS — E1151 Type 2 diabetes mellitus with diabetic peripheral angiopathy without gangrene: Secondary | ICD-10-CM | POA: Diagnosis not present

## 2020-04-13 ENCOUNTER — Other Ambulatory Visit: Payer: Self-pay | Admitting: Family

## 2020-04-24 ENCOUNTER — Telehealth: Payer: Self-pay | Admitting: Cardiovascular Disease

## 2020-04-24 ENCOUNTER — Other Ambulatory Visit (HOSPITAL_COMMUNITY): Payer: Self-pay | Admitting: Podiatry

## 2020-04-24 ENCOUNTER — Other Ambulatory Visit: Payer: Self-pay | Admitting: Podiatry

## 2020-04-24 DIAGNOSIS — I70221 Atherosclerosis of native arteries of extremities with rest pain, right leg: Secondary | ICD-10-CM

## 2020-04-24 NOTE — Telephone Encounter (Signed)
Left a message for the patient to call back.  

## 2020-04-24 NOTE — Telephone Encounter (Signed)
Leah Ferrell is calling requesting to speak with [name] in regards to a sore on her leg that is not healing. I attempted to get more information for further documentation, but patient was persistent with wanting to discuss the issue with [name] to have her consult with Dr. Fletcher [name] in regards to it. Please advise.

## 2020-04-25 NOTE — Telephone Encounter (Signed)
Spoke with the patient. She stated that she stubbed her right pinky toe a few weeks ago and has a non healing open wound ("little tiny spot"). She feels like she has a torn ligament as well. She was placed on antibiotics for it and now has a dopplers scheduled tomorrow.   She wanted Dr. Fletcher [name] to know so he could see the results when they were completed.

## 2020-04-26 ENCOUNTER — Ambulatory Visit (HOSPITAL_COMMUNITY)
Admission: RE | Admit: 2020-04-26 | Discharge: 2020-04-26 | Disposition: A | Discharge status: Home / Self Care | Payer: Medicare Other | Source: Ambulatory Visit | Attending: Podiatry | Admitting: Podiatry

## 2020-04-26 DIAGNOSIS — I70221 Atherosclerosis of native arteries of extremities with rest pain, right leg: Secondary | ICD-10-CM | POA: Diagnosis not present

## 2020-04-26 DIAGNOSIS — I70223 Atherosclerosis of native arteries of extremities with rest pain, bilateral legs: Secondary | ICD-10-CM | POA: Diagnosis not present

## 2020-04-27 NOTE — Telephone Encounter (Signed)
ABI was moderately reduced as expected based on her history.  Schedule her to see me in 1 to 2 weeks to make sure that the wound is healing.  If it does not heal, she will need an angiogram.

## 2020-04-30 NOTE — Telephone Encounter (Signed)
Appointment made for 05/01/20 with Dr. Fletcher [name]

## 2020-05-01 ENCOUNTER — Other Ambulatory Visit: Payer: Self-pay

## 2020-05-01 ENCOUNTER — Ambulatory Visit: Payer: Medicare Other | Admitting: Cardiovascular Disease

## 2020-05-01 ENCOUNTER — Telehealth: Payer: Self-pay | Admitting: *Deleted

## 2020-05-01 ENCOUNTER — Telehealth: Payer: Self-pay | Admitting: Cardiovascular Disease

## 2020-05-01 ENCOUNTER — Encounter: Payer: Self-pay | Admitting: Cardiovascular Disease

## 2020-05-01 VITALS — BP 122/60 | HR 70 | Ht 62.5 in | Wt 205.0 lb

## 2020-05-01 DIAGNOSIS — E785 Hyperlipidemia, unspecified: Secondary | ICD-10-CM | POA: Diagnosis not present

## 2020-05-01 DIAGNOSIS — I5032 Chronic diastolic (congestive) heart failure: Secondary | ICD-10-CM

## 2020-05-01 DIAGNOSIS — I2510 Atherosclerotic heart disease of native coronary artery without angina pectoris: Secondary | ICD-10-CM | POA: Diagnosis not present

## 2020-05-01 DIAGNOSIS — R079 Chest pain, unspecified: Secondary | ICD-10-CM

## 2020-05-01 DIAGNOSIS — I739 Peripheral vascular disease, unspecified: Secondary | ICD-10-CM | POA: Diagnosis not present

## 2020-05-01 DIAGNOSIS — I150 Renovascular hypertension: Secondary | ICD-10-CM | POA: Diagnosis not present

## 2020-05-01 NOTE — Telephone Encounter (Signed)
Patient made aware of Leane Call appointment at [facility] scheduled Monday 05/07/20 at 8:45 am---arrival time is 7:00am at 1st floor radiology for check in.  Pre testing instructions given to patient.

## 2020-05-01 NOTE — Addendum Note (Signed)
Addended by: Ricci Barker on: 05/01/2020 01:32 PM   Modules accepted: Orders

## 2020-05-01 NOTE — Telephone Encounter (Signed)
Call placed to the patient and the daughter about changes to her appointments.   She has been made aware:  Lexiscan Stress test on 3/21 at [facility] with covid test after. PV angiogram has been moved to 3/23 with arrival time at 12:30 pm.

## 2020-05-01 NOTE — Progress Notes (Signed)
Cardiology Office Note   Date:  05/01/2020   ID:  Leah Ferrell, DOB [birth year], MRN [id number]  PCP:  Sharion Balloon, FNP  Cardiologist: Dr. Fletcher [name]   No chief complaint on file.     History of Present Illness: Leah Ferrell is a 70 y.o. female who presents for a followup visit regarding peripheral arterial disease and coronary artery disease.  She has known history of coronary artery disease status post drug-eluting stent placement to the LAD X 2 Most recently in 2015 for in-stent restenosis.  She is known to have peripheral arterial disease status post left common iliac artery stent placement followed by left common femoral artery resection and bypass from left external iliac to the left profunda done by Dr. Kellie Simmering in 2015. She is known to have chronically occluded bilateral SFAs being managed medically.  She had worsening left leg claudication in 2018 with elevated velocity at the proximal anastomosis of the graft.   Angiography in March 2018 showed mild to moderate calcified common iliac artery disease with patent stent in the left common iliac artery. There was severe stenosis in the left common femoral artery at the proximal anastomosis of the bypass to the profunda. I performed successful angioplasty and drug-coated balloon angioplasty.  She is status post bilateral renal artery stenting in January 2021 due to refractory hypertension and recurrent heart failure.  She had significant improvement in blood pressure control as well as heart failure symptoms since then.    Over the last year, she experienced worsening right calf claudication.  In addition, she had a traumatic injury in November which resulted in some ligament problems in the right foot.  In addition, she developed a small ulceration on the lateral side of the small toe.  She has significant discoloration in all toes with significant pain.  Most recent vascular studies showed moderately reduced ABI bilaterally.   Toe pressure on the right side was not detectable.   In addition, she has been experiencing intermittent chest pain and tightness lasting for few minutes both at rest and with exertion.  It happens mostly when she is stressed.   Past Medical History:  Diagnosis Date  . CAD (coronary artery disease)    a. 04/2012 NSTEMI: s/p DES to LAD.  Marland Kitchen Chronic diastolic CHF (congestive heart failure) ([place])   . Diabetes mellitus without complication ([place])   . Dysrhythmia   . Environmental and seasonal allergies   . Hyperlipidemia   . Hypertension   . Leukocytosis   . Morbid obesity ([place])   . NSTEMI (non-ST elevated myocardial infarction) ([place]) 04/27/2012  . PONV (postoperative nausea and vomiting)   . PVD (peripheral vascular disease) ([place])    a. 04/2012 ABI: R 0.49, L 0.39. Angiography 06/14: Significant ostial left common iliac artery stenosis extending into the distal aorta, severe left common femoral artery stenosis, bilateral SFA occlusion with heavy calcifications. Functionally, one-vessel runoff bilaterally below the knee  . Shortness of breath     Past Surgical History:  Procedure Laterality Date  . ABDOMINAL AORTAGRAM N/A 07/21/2012   Procedure: ABDOMINAL Maxcine Ham;  Surgeon: Wellington Hampshire, MD;  Location: [facility] CATH LAB;  Service: Cardiovascular;  Laterality: N/A;  . CARDIAC CATHETERIZATION     stents X 2  . CORONARY ANGIOGRAM  04/27/2012   Procedure: CORONARY ANGIOGRAM;  Surgeon: Thayer Headings, MD;  Location: [facility] CATH LAB;  Service: Cardiovascular;;  . ENDARTERECTOMY FEMORAL Left 11/02/2013   Procedure: RESECTION LEFT COMMON FEMORAL ARTERY AND  INSERTION OF INTERPOSITION 49m HEMASHIELD GRAFT FROM LEFT EXTERNAL  ILIAC ARTERY TO LEFT PROFUNDA  FEMORIS ARTERY;  Surgeon: JMal Misty MD;  [facility]  Service: Vascular;  Laterality: Left;  . EYE SURGERY Right 2017  . FRACTURE SURGERY Right Jul 07, 2014   Right Foot-  [place]  [facility]  by Heat duct  . ILIAC ARTERY STENT Left 08/31/2013  .  LAD stent    . LEFT HEART CATHETERIZATION WITH CORONARY ANGIOGRAM N/A 04/23/2012   Procedure: LEFT HEART CATHETERIZATION WITH CORONARY ANGIOGRAM;  Surgeon: Peter M JMartinique MD;  [facility];  Service: Cardiovascular;  Laterality: N/A;  . LOWER EXTREMITY ANGIOGRAM Left 08/31/2013   Procedure: LOWER EXTREMITY ANGIOGRAM;  Surgeon: MWellington Hampshire MD;  [facility];  Service: Cardiovascular;  Laterality: Left;  . PERCUTANEOUS STENT INTERVENTION Left 08/31/2013   Procedure: PERCUTANEOUS STENT INTERVENTION;  Surgeon: MWellington Hampshire MD;  [facility];  Service: Cardiovascular;  Laterality: Left;  Common Iliac artery  . PERIPHERAL VASCULAR CATHETERIZATION N/A 03/19/2016   Procedure: Abdominal Aortogram w/Lower Extremity;  Surgeon: MWellington Hampshire MD;  [facility];  Service: Cardiovascular;  Laterality: N/A;  . PERIPHERAL VASCULAR CATHETERIZATION  03/19/2016   Procedure: Peripheral Vascular Balloon Angioplasty-CFA Left;  Surgeon: MWellington Hampshire MD;  [facility];  Service: Cardiovascular;;  . PERIPHERAL VASCULAR INTERVENTION Bilateral 03/16/2019   Procedure: PERIPHERAL VASCULAR INTERVENTION;  Surgeon: AWellington Hampshire MD;  [facility];  Service: Cardiovascular;  Laterality: Bilateral;  RENAL  . RENAL ANGIOGRAPHY  03/16/2019  . RENAL ANGIOGRAPHY Bilateral 03/16/2019   Procedure: RENAL ANGIOGRAPHY;  Surgeon: AWellington Hampshire MD;  [facility];  Service: Cardiovascular;  Laterality: Bilateral;  . 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 acetaminophen (TYLENOL) 325 MG tablet Take 2 tablets (650 mg total) by mouth every 6 (six) hours as needed for mild pain (or Fever >/= 101). 20 tablet 0  . albuterol (PROVENTIL HFA) 108 (90 Base) MCG/ACT inhaler INHALE ONE TO TWO PUFFS BY  MOUTH EVERY 6 HOURS AS  NEEDED FOR WHEEZING OR FOR  SHORTNESS OF  BREATH (Patient taking differently: 1-2 puffs. INHALE ONE TO TWO PUFFS BY  MOUTH EVERY 6 HOURS AS  NEEDED FOR WHEEZING OR FOR  SHORTNESS OF BREATH) 13.4 g 2  . amLODipine (NORVASC) 5 MG tablet Take 1 tablet (5 mg total) by mouth daily. 90 tablet 2  . Ascorbic Acid (VITAMIN C) 1000 MG tablet Take 1,000 mg by mouth 3 (three) times daily.     .Marland Kitchenaspirin EC 81 MG tablet Take 81 mg by mouth daily. In the morning    . BD ULTRA-FINE PEN NEEDLES 29G X 12.7MM MISC USE PENNEEDLES 3 TIMES  DAILY AS DIRECTED 270 each 1  . blood glucose meter kit and supplies Dispense based on patient and insurance preference. Use up to four times daily as directed. (FOR ICD-10 E10.9, E11.9). 1 each 0  . Calcium Carbonate-Vit D-Min (CALTRATE 600+D PLUS MINERALS) 600-800 MG-UNIT TABS Take 1 tablet by mouth 3 (three) times daily.     . carvedilol (COREG) 6.25 MG tablet Take 1 tablet by mouth twice daily 60 tablet 3  . cephALEXin (KEFLEX) 500 MG capsule Take 1 capsule (500 mg total) by mouth 3 (three) times daily. 30 capsule 0  . Cholecalciferol (VITAMIN D-3)  125 MCG (5000 UT) TABS Take 5,000 Units by mouth daily.    . clopidogrel (PLAVIX) 75 MG tablet Take 1 tablet (75 mg total) by mouth daily. 90 tablet 0  . fish oil-omega-3 fatty acids 1000 MG capsule Take 1 g by mouth daily.     Marland Kitchen glucose blood (GLUCOSE METER TEST) test strip Use 6-10 times a day, Uses ReliOn 900 each 12  . halobetasol (ULTRAVATE) 0.05 % cream APPLY  CREAM TOPICALLY TWICE DAILY 45 g 0  . hydroxypropyl methylcellulose (ISOPTO TEARS) 2.5 % ophthalmic solution Place 1 drop into both eyes 3 (three) times daily as needed (for dry eyes).    . insulin aspart (FIASP FLEXTOUCH) 100 UNIT/ML FlexTouch Pen INJECT 40-50 UNITS SUBCUTANEOUSLY THREE TIMES DAILY BEFORE MEAL(S), 9 mL 2  . Insulin Syringe 27G X 1/2" 0.5 ML MISC Use with Fiasp insulin TID 100 each 3  . lisinopril (ZESTRIL) 20 MG tablet Take 1 tablet (20 mg total) by mouth 2 (two) times daily. 60 tablet 5  .  montelukast (SINGULAIR) 10 MG tablet Take 1 tablet by mouth once daily with breakfast 90 tablet 2  . nitroGLYCERIN (NITROSTAT) 0.4 MG SL tablet Place 1 tablet (0.4 mg total) under the tongue every 5 (five) minutes x 3 doses as needed for chest pain (up to 3 doses). 25 tablet 4  . RELION PEN NEEDLES 29G X 12MM MISC USE 1 NEEDLE TO INJECT INSULIN SUBCUTANEOUSLY 3 TIMES DAILY (Patient taking differently: 1 Device by Subconjunctival route 3 (three) times daily.) 50 each 5  . rosuvastatin (CRESTOR) 40 MG tablet Take 1 tablet (40 mg total) by mouth daily. 90 tablet 3  . Semaglutide,0.25 or 0.5MG /DOS, (OZEMPIC, 0.25 OR 0.5 MG/DOSE,) 2 MG/1.5ML SOPN Inject 1 mg into the skin once a week. 6 mL 3  . spironolactone (ALDACTONE) 25 MG tablet Take 1 tablet (25 mg total) by mouth daily. 90 tablet 3   No current facility-administered medications for this visit.    Allergies:   Tape    Social History:  The patient  reports that she quit smoking about 19 years ago. Her smoking use included cigarettes. She started smoking about 56 years ago. She has a 36.00 pack-year smoking history. She has never used smokeless tobacco. She reports that she does not drink alcohol and does not use drugs.   Family History:  The patient's family history includes Alcohol abuse in her brother; Diabetes in her brother, daughter, maternal grandmother, mother, and son; Heart attack in her maternal grandfather; Heart disease in her brother; Hyperlipidemia in her mother and son; Hypertension in her daughter, mother, and son; Other in her sister and sister; Peripheral vascular disease in her mother.    ROS:  Please see the history of present illness.   Otherwise, review of systems are positive for none.   All other systems are reviewed and negative.    PHYSICAL EXAM: VS:  BP 122/60 (BP Location: Left Arm, Patient Position: Sitting)   Pulse 70   Ht 5' 2.5" (1.588 m)   Wt 205 lb (93 kg)   SpO2 97%   BMI 36.90 kg/m  , BMI Body mass  index is 36.9 kg/m. GEN: Well nourished, well developed, in no acute distress  HEENT: normal  Neck: no JVD, carotid bruits, or masses Cardiac: RRR; no  rubs, or gallops , trace edema . There is 2/6 systolic ejection murmur in the aortic area Respiratory:  clear to auscultation bilaterally, normal work of breathing GI: soft, nontender, nondistended, +  BS MS: no deformity or atrophy  Skin: warm and dry, no rash Neuro:  Strength and sensation are intact Psych: euthymic mood, full affect Distal pulses are not palpable.  EKG:  EKG is  ordered today.  EKG showed sinus rhythm with nonspecific T wave changes.  Recent Labs: 03/20/2020: ALT 32; BUN 17; Creatinine, Ser 0.80; Hemoglobin 14.0; Platelets 325; Potassium 4.8; Sodium 132    Lipid Panel    Component Value Date/Time   CHOL 150 03/20/2020 1222   TRIG 137 03/20/2020 1222   TRIG 135 09/16/2012 1322   HDL 60 03/20/2020 1222   HDL 60 09/16/2012 1322   CHOLHDL 2.5 03/20/2020 1222   CHOLHDL 3.2 05/06/2012 1713   VLDL 32 05/06/2012 1713   LDLCALC 66 03/20/2020 1222   LDLCALC 92 09/16/2012 1322      Wt Readings from Last 3 Encounters:  05/01/20 205 lb (93 kg)  03/20/20 206 lb 12.8 oz (93.8 kg)  12/19/19 208 lb 12.8 oz (94.7 kg)       No flowsheet data found.    ASSESSMENT AND PLAN:  1. Peripheral arterial disease: Progressive right calf claudication over the last year and now with discoloration in the toes and nonhealing ulceration on the right small toe after an injury in November.  She describes rest pain of the right foot.  This is a limb threatening situation.  She is known to have chronically occluded bilateral SFAs since at least 2015.  Given her current presentation, recommend proceeding with abdominal aortogram with lower extremity runoff and possible endovascular intervention.  I discussed the procedure in details as well as risk and benefits.  Planned access via the left common femoral artery. There is a possibility  see she might require surgical revascularization given chronicity of SFA occlusion.  2. Renovascular hypertension: Blood pressure is well controlled with normal renal function.  3. Coronary artery disease involving native coronary arteries with other forms of angina: She is now having recurrent chest pain with some anginal and some atypical features.  I requested a Lexiscan Myoview for evaluation.  If abnormal, cardiac catheterization could potentially be done at the same time.  4. Hyperlipidemia: Continue high-dose rosuvastatin with a target LDL of less than 70.  5.  Chronic diastolic heart failure: Most likely in the setting of uncontrolled hypertension and severe bilateral renal artery stenosis.  Currently not requiring a loop diuretic since she had her renal artery stents.   Disposition:   FU with me in 1  month  Signed,  [name], MD  05/01/2020 1:11 PM    [place]

## 2020-05-01 NOTE — H&P (View-Only) (Signed)
Cardiology Office Note   Date:  05/01/2020   ID:  Leah Ferrell, DOB [birth year], MRN [id number]  PCP:  Sharion Balloon, FNP  Cardiologist: Dr. Fletcher [name]   No chief complaint on file.     History of Present Illness: Leah Ferrell is a 70 y.o. female who presents for a followup visit regarding peripheral arterial disease and coronary artery disease.  She has known history of coronary artery disease status post drug-eluting stent placement to the LAD X 2 Most recently in 2015 for in-stent restenosis.  She is known to have peripheral arterial disease status post left common iliac artery stent placement followed by left common femoral artery resection and bypass from left external iliac to the left profunda done by Dr. Kellie Simmering in 2015. She is known to have chronically occluded bilateral SFAs being managed medically.  She had worsening left leg claudication in 2018 with elevated velocity at the proximal anastomosis of the graft.   Angiography in March 2018 showed mild to moderate calcified common iliac artery disease with patent stent in the left common iliac artery. There was severe stenosis in the left common femoral artery at the proximal anastomosis of the bypass to the profunda. I performed successful angioplasty and drug-coated balloon angioplasty.  She is status post bilateral renal artery stenting in January 2021 due to refractory hypertension and recurrent heart failure.  She had significant improvement in blood pressure control as well as heart failure symptoms since then.    Over the last year, she experienced worsening right calf claudication.  In addition, she had a traumatic injury in November which resulted in some ligament problems in the right foot.  In addition, she developed a small ulceration on the lateral side of the small toe.  She has significant discoloration in all toes with significant pain.  Most recent vascular studies showed moderately reduced ABI bilaterally.   Toe pressure on the right side was not detectable.   In addition, she has been experiencing intermittent chest pain and tightness lasting for few minutes both at rest and with exertion.  It happens mostly when she is stressed.   Past Medical History:  Diagnosis Date  . CAD (coronary artery disease)    a. 04/2012 NSTEMI: s/p DES to LAD.  Marland Kitchen Chronic diastolic CHF (congestive heart failure) ([place])   . Diabetes mellitus without complication ([place])   . Dysrhythmia   . Environmental and seasonal allergies   . Hyperlipidemia   . Hypertension   . Leukocytosis   . Morbid obesity ([place])   . NSTEMI (non-ST elevated myocardial infarction) ([place]) 04/27/2012  . PONV (postoperative nausea and vomiting)   . PVD (peripheral vascular disease) ([place])    a. 04/2012 ABI: R 0.49, L 0.39. Angiography 06/14: Significant ostial left common iliac artery stenosis extending into the distal aorta, severe left common femoral artery stenosis, bilateral SFA occlusion with heavy calcifications. Functionally, one-vessel runoff bilaterally below the knee  . Shortness of breath     Past Surgical History:  Procedure Laterality Date  . ABDOMINAL AORTAGRAM N/A 07/21/2012   Procedure: ABDOMINAL Maxcine Ham;  Surgeon: Wellington Hampshire, MD;  Location: [facility] CATH LAB;  Service: Cardiovascular;  Laterality: N/A;  . CARDIAC CATHETERIZATION     stents X 2  . CORONARY ANGIOGRAM  04/27/2012   Procedure: CORONARY ANGIOGRAM;  Surgeon: Thayer Headings, MD;  Location: [facility] CATH LAB;  Service: Cardiovascular;;  . ENDARTERECTOMY FEMORAL Left 11/02/2013   Procedure: RESECTION LEFT COMMON FEMORAL ARTERY AND  INSERTION OF INTERPOSITION 49m HEMASHIELD GRAFT FROM LEFT EXTERNAL  ILIAC ARTERY TO LEFT PROFUNDA  FEMORIS ARTERY;  Surgeon: JMal Misty MD;  [facility]  Service: Vascular;  Laterality: Left;  . EYE SURGERY Right 2017  . FRACTURE SURGERY Right Jul 07, 2014   Right Foot-  [place]  at Home  by Heat duct  . ILIAC ARTERY STENT Left 08/31/2013  .  LAD stent    . LEFT HEART CATHETERIZATION WITH CORONARY ANGIOGRAM N/A 04/23/2012   Procedure: LEFT HEART CATHETERIZATION WITH CORONARY ANGIOGRAM;  Surgeon: Peter M JMartinique MD;  [facility];  Service: Cardiovascular;  Laterality: N/A;  . LOWER EXTREMITY ANGIOGRAM Left 08/31/2013   Procedure: LOWER EXTREMITY ANGIOGRAM;  Surgeon: MWellington Hampshire MD;  [facility];  Service: Cardiovascular;  Laterality: Left;  . PERCUTANEOUS STENT INTERVENTION Left 08/31/2013   Procedure: PERCUTANEOUS STENT INTERVENTION;  Surgeon: MWellington Hampshire MD;  [facility];  Service: Cardiovascular;  Laterality: Left;  Common Iliac artery  . PERIPHERAL VASCULAR CATHETERIZATION N/A 03/19/2016   Procedure: Abdominal Aortogram w/Lower Extremity;  Surgeon: MWellington Hampshire MD;  [facility];  Service: Cardiovascular;  Laterality: N/A;  . PERIPHERAL VASCULAR CATHETERIZATION  03/19/2016   Procedure: Peripheral Vascular Balloon Angioplasty-CFA Left;  Surgeon: MWellington Hampshire MD;  [facility];  Service: Cardiovascular;;  . PERIPHERAL VASCULAR INTERVENTION Bilateral 03/16/2019   Procedure: PERIPHERAL VASCULAR INTERVENTION;  Surgeon: AWellington Hampshire MD;  [facility];  Service: Cardiovascular;  Laterality: Bilateral;  RENAL  . RENAL ANGIOGRAPHY  03/16/2019  . RENAL ANGIOGRAPHY Bilateral 03/16/2019   Procedure: RENAL ANGIOGRAPHY;  Surgeon: AWellington Hampshire MD;  [facility];  Service: Cardiovascular;  Laterality: Bilateral;  . 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 acetaminophen (TYLENOL) 325 MG tablet Take 2 tablets (650 mg total) by mouth every 6 (six) hours as needed for mild pain (or Fever >/= 101). 20 tablet 0  . albuterol (PROVENTIL HFA) 108 (90 Base) MCG/ACT inhaler INHALE ONE TO TWO PUFFS BY  MOUTH EVERY 6 HOURS AS  NEEDED FOR WHEEZING OR FOR  SHORTNESS OF  BREATH (Patient taking differently: 1-2 puffs. INHALE ONE TO TWO PUFFS BY  MOUTH EVERY 6 HOURS AS  NEEDED FOR WHEEZING OR FOR  SHORTNESS OF BREATH) 13.4 g 2  . amLODipine (NORVASC) 5 MG tablet Take 1 tablet (5 mg total) by mouth daily. 90 tablet 2  . Ascorbic Acid (VITAMIN C) 1000 MG tablet Take 1,000 mg by mouth 3 (three) times daily.     .Marland Kitchenaspirin EC 81 MG tablet Take 81 mg by mouth daily. In the morning    . BD ULTRA-FINE PEN NEEDLES 29G X 12.7MM MISC USE PENNEEDLES 3 TIMES  DAILY AS DIRECTED 270 each 1  . blood glucose meter kit and supplies Dispense based on patient and insurance preference. Use up to four times daily as directed. (FOR ICD-10 E10.9, E11.9). 1 each 0  . Calcium Carbonate-Vit D-Min (CALTRATE 600+D PLUS MINERALS) 600-800 MG-UNIT TABS Take 1 tablet by mouth 3 (three) times daily.     . carvedilol (COREG) 6.25 MG tablet Take 1 tablet by mouth twice daily 60 tablet 3  . cephALEXin (KEFLEX) 500 MG capsule Take 1 capsule (500 mg total) by mouth 3 (three) times daily. 30 capsule 0  . Cholecalciferol (VITAMIN D-3)  125 MCG (5000 UT) TABS Take 5,000 Units by mouth daily.    . clopidogrel (PLAVIX) 75 MG tablet Take 1 tablet (75 mg total) by mouth daily. 90 tablet 0  . fish oil-omega-3 fatty acids 1000 MG capsule Take 1 g by mouth daily.     Marland Kitchen glucose blood (GLUCOSE METER TEST) test strip Use 6-10 times a day, Uses ReliOn 900 each 12  . halobetasol (ULTRAVATE) 0.05 % cream APPLY  CREAM TOPICALLY TWICE DAILY 45 g 0  . hydroxypropyl methylcellulose (ISOPTO TEARS) 2.5 % ophthalmic solution Place 1 drop into both eyes 3 (three) times daily as needed (for dry eyes).    . insulin aspart (FIASP FLEXTOUCH) 100 UNIT/ML FlexTouch Pen INJECT 40-50 UNITS SUBCUTANEOUSLY THREE TIMES DAILY BEFORE MEAL(S), 9 mL 2  . Insulin Syringe 27G X 1/2" 0.5 ML MISC Use with Fiasp insulin TID 100 each 3  . lisinopril (ZESTRIL) 20 MG tablet Take 1 tablet (20 mg total) by mouth 2 (two) times daily. 60 tablet 5  .  montelukast (SINGULAIR) 10 MG tablet Take 1 tablet by mouth once daily with breakfast 90 tablet 2  . nitroGLYCERIN (NITROSTAT) 0.4 MG SL tablet Place 1 tablet (0.4 mg total) under the tongue every 5 (five) minutes x 3 doses as needed for chest pain (up to 3 doses). 25 tablet 4  . RELION PEN NEEDLES 29G X 12MM MISC USE 1 NEEDLE TO INJECT INSULIN SUBCUTANEOUSLY 3 TIMES DAILY (Patient taking differently: 1 Device by Subconjunctival route 3 (three) times daily.) 50 each 5  . rosuvastatin (CRESTOR) 40 MG tablet Take 1 tablet (40 mg total) by mouth daily. 90 tablet 3  . Semaglutide,0.25 or 0.5MG /DOS, (OZEMPIC, 0.25 OR 0.5 MG/DOSE,) 2 MG/1.5ML SOPN Inject 1 mg into the skin once a week. 6 mL 3  . spironolactone (ALDACTONE) 25 MG tablet Take 1 tablet (25 mg total) by mouth daily. 90 tablet 3   No current facility-administered medications for this visit.    Allergies:   Tape    Social History:  The patient  reports that she quit smoking about 19 years ago. Her smoking use included cigarettes. She started smoking about 56 years ago. She has a 36.00 pack-year smoking history. She has never used smokeless tobacco. She reports that she does not drink alcohol and does not use drugs.   Family History:  The patient's family history includes Alcohol abuse in her brother; Diabetes in her brother, daughter, maternal grandmother, mother, and son; Heart attack in her maternal grandfather; Heart disease in her brother; Hyperlipidemia in her mother and son; Hypertension in her daughter, mother, and son; Other in her sister and sister; Peripheral vascular disease in her mother.    ROS:  Please see the history of present illness.   Otherwise, review of systems are positive for none.   All other systems are reviewed and negative.    PHYSICAL EXAM: VS:  BP 122/60 (BP Location: Left Arm, Patient Position: Sitting)   Pulse 70   Ht 5' 2.5" (1.588 m)   Wt 205 lb (93 kg)   SpO2 97%   BMI 36.90 kg/m  , BMI Body mass  index is 36.9 kg/m. GEN: Well nourished, well developed, in no acute distress  HEENT: normal  Neck: no JVD, carotid bruits, or masses Cardiac: RRR; no  rubs, or gallops , trace edema . There is 2/6 systolic ejection murmur in the aortic area Respiratory:  clear to auscultation bilaterally, normal work of breathing GI: soft, nontender, nondistended, +  BS MS: no deformity or atrophy  Skin: warm and dry, no rash Neuro:  Strength and sensation are intact Psych: euthymic mood, full affect Distal pulses are not palpable.  EKG:  EKG is  ordered today.  EKG showed sinus rhythm with nonspecific T wave changes.  Recent Labs: 03/20/2020: ALT 32; BUN 17; Creatinine, Ser 0.80; Hemoglobin 14.0; Platelets 325; Potassium 4.8; Sodium 132    Lipid Panel    Component Value Date/Time   CHOL 150 03/20/2020 1222   TRIG 137 03/20/2020 1222   TRIG 135 09/16/2012 1322   HDL 60 03/20/2020 1222   HDL 60 09/16/2012 1322   CHOLHDL 2.5 03/20/2020 1222   CHOLHDL 3.2 05/06/2012 1713   VLDL 32 05/06/2012 1713   LDLCALC 66 03/20/2020 1222   LDLCALC 92 09/16/2012 1322      Wt Readings from Last 3 Encounters:  05/01/20 205 lb (93 kg)  03/20/20 206 lb 12.8 oz (93.8 kg)  12/19/19 208 lb 12.8 oz (94.7 kg)       No flowsheet data found.    ASSESSMENT AND PLAN:  1. Peripheral arterial disease: Progressive right calf claudication over the last year and now with discoloration in the toes and nonhealing ulceration on the right small toe after an injury in November.  She describes rest pain of the right foot.  This is a limb threatening situation.  She is known to have chronically occluded bilateral SFAs since at least 2015.  Given her current presentation, recommend proceeding with abdominal aortogram with lower extremity runoff and possible endovascular intervention.  I discussed the procedure in details as well as risk and benefits.  Planned access via the left common femoral artery. There is a possibility  see she might require surgical revascularization given chronicity of SFA occlusion.  2. Renovascular hypertension: Blood pressure is well controlled with normal renal function.  3. Coronary artery disease involving native coronary arteries with other forms of angina: She is now having recurrent chest pain with some anginal and some atypical features.  I requested a Lexiscan Myoview for evaluation.  If abnormal, cardiac catheterization could potentially be done at the same time.  4. Hyperlipidemia: Continue high-dose rosuvastatin with a target LDL of less than 70.  5.  Chronic diastolic heart failure: Most likely in the setting of uncontrolled hypertension and severe bilateral renal artery stenosis.  Currently not requiring a loop diuretic since she had her renal artery stents.   Disposition:   FU with me in 1  month  Signed,  [name], MD  05/01/2020 1:11 PM    [place]

## 2020-05-01 NOTE — Addendum Note (Signed)
Addended by: Ricci Barker on: 05/01/2020 01:38 PM   Modules accepted: Orders

## 2020-05-01 NOTE — Patient Instructions (Signed)
Medication Instructions:  No changes *If you need a refill on your cardiac medications before your next appointment, please call your pharmacy*   Lab Work: Your provider would like for you to have the following labs today: CBC and BMET  If you have labs (blood work) drawn today and your tests are completely normal, you will receive your results only by: Marland Kitchen MyChart Message (if you have MyChart) OR . A paper copy in the mail If you have any lab test that is abnormal or we need to change your treatment, we will call you to review the results.   Testing/Procedures: Your physician has requested that you have a lexiscan myoview. For further information please visit [email or web address]. Please follow instruction sheet, as given. This will take place at [place], [address]  How to prepare for your Myocardial Perfusion Test:  Do not eat or drink 3 hours prior to your test, except you may have water.  Do not consume products containing caffeine (regular or decaffeinated) 12 hours prior to your test. (ex: coffee, chocolate, sodas, tea).  Do bring a list of your current medications with you.  If not listed below, you may take your medications as normal.  Do wear comfortable clothes (no dresses or overalls) and walking shoes, tennis shoes preferred (No heels or open toe shoes are allowed).  Do NOT wear cologne, perfume, aftershave, or lotions (deodorant is allowed).  The test will take approximately 3 to 4 hours to complete  If these instructions are not followed, your test will have to be rescheduled.  Hold diabetic medi  Your physician has requested that you have a peripheral vascular angiogram. This exam is performed at the hospital. During this exam IV contrast is used to look at arterial blood flow. Please review the information sheet given for details.  Follow-Up: At [facility], you and your health needs are our priority.  As part of our continuing mission to provide you  with exceptional heart care, we have created designated Provider Care Teams.  These Care Teams include your primary Cardiologist (physician) and Advanced Practice Providers (APPs -  Physician Assistants and Nurse Practitioners) who all work together to provide you with the care you need, when you need it.  We recommend signing up for the patient portal called "MyChart".  Sign up information is provided on this After Visit Summary.  MyChart is used to connect with patients for Virtual Visits (Telemedicine).  Patients are able to view lab/test results, encounter notes, upcoming appointments, etc.  Non-urgent messages can be sent to your provider as well.   To learn more about what you can do with MyChart, go to [email or web address].    Your next appointment:   Follow up with Dr. Fletcher [name] on 06/12/20 at 11:40 am   Other Instructions    [place] [address] Dept: [phone] Loc: [place]  05/01/2020  You are scheduled for a Peripheral Angiogram on Wednesday, March 30 with Dr. [name].  1. Please arrive at the [facility] (Main Entrance A) at [facility]: [address] [place], [place] [address] at 6:30 AM (This time is two hours before your procedure to ensure your preparation). Free valet parking service is available.   Special note: Every effort is made to have your procedure done on time. Please understand that emergencies sometimes delay scheduled procedures.  2. Diet: Do not eat solid foods  after midnight.  The patient may have clear liquids until 5am upon the day of the procedure.  3. Labs: You will need to have the coronavirus test completed prior to your procedure. An appointment has been made at 1:15pm on 05/14/20. This is a Financial risk analyst at [employer]. Please tell them that you are there for procedure testing. Stay in your car and  someone will be with you shortly. Please make sure to have all other labs completed before this test because you will need to stay quarantined until your procedure.   4. Medication instructions in preparation for your procedure: Hold all diabetic medications the morning of the procedure Hold the Spironolactone the morning of the procedure  On the morning of your procedure, take your Aspirin and Plavix/Clopidogrel and any morning medicines NOT listed above.  You may use sips of water.  5. Plan for one night stay--bring personal belongings. 6. Bring a current list of your medications and current insurance cards. 7. You MUST have a responsible person to drive you home. 8. Someone MUST be with you the first 24 hours after you arrive home or your discharge will be delayed. 9. Please wear clothes that are easy to get on and off and wear slip-on shoes.  Thank you for allowing [place] to care for you!   -- [facility]

## 2020-05-02 LAB — BASIC METABOLIC PANEL
BUN/Creatinine Ratio: 22 (ref 12–28)
BUN: 20 mg/dL (ref 8–27)
CO2: 24 mmol/L (ref 20–29)
Calcium: 10.7 mg/dL — ABNORMAL HIGH (ref 8.7–10.3)
Chloride: 95 mmol/L — ABNORMAL LOW (ref 96–106)
Creatinine, Ser: 0.92 mg/dL (ref 0.57–1.00)
Glucose: 132 mg/dL — ABNORMAL HIGH (ref 65–99)
Potassium: 4.8 mmol/L (ref 3.5–5.2)
Sodium: 134 mmol/L (ref 134–144)
eGFR: 67 mL/min/{1.73_m2} (ref >59)

## 2020-05-02 LAB — CBC
Hematocrit: 43.2 % (ref 34.0–46.6)
Hemoglobin: 13.9 g/dL (ref 11.1–15.9)
MCH: 30.8 pg (ref 26.6–33.0)
MCHC: 32.2 g/dL (ref 31.5–35.7)
MCV: 96 fL (ref 79–97)
Platelets: 304 10*3/uL (ref 150–450)
RBC: 4.52 x10E6/uL (ref 3.77–5.28)
RDW: 13.5 % (ref 11.7–15.4)
WBC: 12.8 10*3/uL — ABNORMAL HIGH (ref 3.4–10.8)

## 2020-05-04 NOTE — Addendum Note (Signed)
Addended by: Ricci Barker on: 05/04/2020 08:08 AM   Modules accepted: Orders

## 2020-05-07 ENCOUNTER — Ambulatory Visit (HOSPITAL_COMMUNITY)
Admission: RE | Admit: 2020-05-07 | Discharge: 2020-05-07 | Disposition: A | Discharge status: Home / Self Care | Payer: Medicare Other | Source: Ambulatory Visit | Attending: Cardiovascular Disease | Admitting: Cardiovascular Disease

## 2020-05-07 ENCOUNTER — Other Ambulatory Visit (HOSPITAL_COMMUNITY)
Admission: RE | Admit: 2020-05-07 | Discharge: 2020-05-07 | Disposition: A | Discharge status: Home / Self Care | Payer: Medicare Other | Source: Ambulatory Visit | Attending: Cardiovascular Disease | Admitting: Cardiovascular Disease

## 2020-05-07 ENCOUNTER — Other Ambulatory Visit: Payer: Self-pay

## 2020-05-07 ENCOUNTER — Encounter (HOSPITAL_COMMUNITY)
Admission: RE | Admit: 2020-05-07 | Discharge: 2020-05-07 | Disposition: A | Discharge status: Home / Self Care | Payer: Medicare Other | Source: Ambulatory Visit | Attending: Cardiovascular Disease | Admitting: Cardiovascular Disease

## 2020-05-07 ENCOUNTER — Other Ambulatory Visit (HOSPITAL_COMMUNITY): Payer: Medicare Other

## 2020-05-07 ENCOUNTER — Encounter (HOSPITAL_COMMUNITY): Admission: RE | Admit: 2020-05-07 | Payer: Medicare Other | Source: Ambulatory Visit

## 2020-05-07 DIAGNOSIS — Z20822 Contact with and (suspected) exposure to covid-19: Secondary | ICD-10-CM | POA: Diagnosis not present

## 2020-05-07 DIAGNOSIS — R079 Chest pain, unspecified: Secondary | ICD-10-CM | POA: Diagnosis not present

## 2020-05-07 DIAGNOSIS — Z01812 Encounter for preprocedural laboratory examination: Secondary | ICD-10-CM | POA: Insufficient documentation

## 2020-05-07 LAB — NM MYOCAR MULTI W/SPECT W/WALL MOTION / EF
LV dias vol: 67 mL (ref 46–106)
LV sys vol: 23 mL
Peak HR: 89 {beats}/min
RATE: 0.28
Rest HR: 72 {beats}/min
SDS: 10
SRS: 4
SSS: 14
TID: 1.21

## 2020-05-07 MED ORDER — REGADENOSON 0.4 MG/5ML IV SOLN
INTRAVENOUS | Status: AC
  Administered 2020-05-07: 0.4 mg via INTRAVENOUS
  Filled 2020-05-07: qty 5

## 2020-05-07 MED ORDER — TECHNETIUM TC 99M TETROFOSMIN IV KIT
10.0000 | PACK | Freq: Once | INTRAVENOUS | Status: AC | PRN
  Administered 2020-05-07: 9 via INTRAVENOUS

## 2020-05-07 MED ORDER — TECHNETIUM TC 99M TETROFOSMIN IV KIT
30.0000 | PACK | Freq: Once | INTRAVENOUS | Status: AC | PRN
  Administered 2020-05-07: 30 via INTRAVENOUS

## 2020-05-07 MED ORDER — SODIUM CHLORIDE FLUSH 0.9 % IV SOLN
INTRAVENOUS | Status: AC
  Administered 2020-05-07: 10 mL via INTRAVENOUS
  Filled 2020-05-07: qty 10

## 2020-05-08 ENCOUNTER — Telehealth: Payer: Self-pay | Admitting: *Deleted

## 2020-05-08 LAB — SARS CORONAVIRUS 2 (TAT 6-24 HRS): SARS Coronavirus 2: NEGATIVE

## 2020-05-08 NOTE — Telephone Encounter (Signed)
Pt contacted pre-abdominal aortogram  scheduled at [facility] for: Wednesday May 09, 2020 2:30 PM Verified arrival time and place: [facility]) at: 12:30 PM   No solid food after midnight prior to cath, clear liquids until 5 AM day of procedure.  Hold: Insulin -AM of procedure  Spironolactone-AM of procedure   Except hold medications AM meds can be  taken pre-cath with sips of water including: ASA 81 mg Plavix 75 mg  Confirmed patient has responsible adult to drive home post procedure and be with patient first 24 hours after arriving home: yes  You are allowed ONE visitor in the waiting room during the time you are at the hospital for your procedure. Both you and your visitor must wear a mask once you enter the hospital.  Reviewed procedure/mask/visitor instructions with patient.

## 2020-05-09 ENCOUNTER — Ambulatory Visit (HOSPITAL_COMMUNITY)
Admission: RE | Admit: 2020-05-09 | Discharge: 2020-05-10 | Disposition: A | Discharge status: Home / Self Care | Payer: Medicare Other | Attending: Cardiovascular Disease | Admitting: Cardiovascular Disease

## 2020-05-09 ENCOUNTER — Other Ambulatory Visit: Payer: Self-pay

## 2020-05-09 ENCOUNTER — Encounter (HOSPITAL_COMMUNITY): Payer: Self-pay | Admitting: Cardiovascular Disease

## 2020-05-09 ENCOUNTER — Encounter (HOSPITAL_COMMUNITY)
Admission: RE | Disposition: A | Discharge status: Home / Self Care | Payer: Self-pay | Source: Home / Self Care | Attending: Cardiovascular Disease

## 2020-05-09 DIAGNOSIS — I150 Renovascular hypertension: Secondary | ICD-10-CM | POA: Insufficient documentation

## 2020-05-09 DIAGNOSIS — E782 Mixed hyperlipidemia: Secondary | ICD-10-CM | POA: Diagnosis present

## 2020-05-09 DIAGNOSIS — I5032 Chronic diastolic (congestive) heart failure: Secondary | ICD-10-CM | POA: Diagnosis not present

## 2020-05-09 DIAGNOSIS — L97519 Non-pressure chronic ulcer of other part of right foot with unspecified severity: Secondary | ICD-10-CM | POA: Diagnosis not present

## 2020-05-09 DIAGNOSIS — E1159 Type 2 diabetes mellitus with other circulatory complications: Secondary | ICD-10-CM | POA: Diagnosis present

## 2020-05-09 DIAGNOSIS — S99921A Unspecified injury of right foot, initial encounter: Secondary | ICD-10-CM | POA: Insufficient documentation

## 2020-05-09 DIAGNOSIS — I70235 Atherosclerosis of native arteries of right leg with ulceration of other part of foot: Secondary | ICD-10-CM | POA: Diagnosis not present

## 2020-05-09 DIAGNOSIS — X58XXXA Exposure to other specified factors, initial encounter: Secondary | ICD-10-CM | POA: Insufficient documentation

## 2020-05-09 DIAGNOSIS — I70213 Atherosclerosis of native arteries of extremities with intermittent claudication, bilateral legs: Secondary | ICD-10-CM

## 2020-05-09 DIAGNOSIS — E1151 Type 2 diabetes mellitus with diabetic peripheral angiopathy without gangrene: Secondary | ICD-10-CM | POA: Diagnosis present

## 2020-05-09 DIAGNOSIS — T82855A Stenosis of coronary artery stent, initial encounter: Secondary | ICD-10-CM | POA: Diagnosis not present

## 2020-05-09 DIAGNOSIS — I2510 Atherosclerotic heart disease of native coronary artery without angina pectoris: Secondary | ICD-10-CM | POA: Diagnosis present

## 2020-05-09 DIAGNOSIS — E785 Hyperlipidemia, unspecified: Secondary | ICD-10-CM | POA: Insufficient documentation

## 2020-05-09 DIAGNOSIS — I10 Essential (primary) hypertension: Secondary | ICD-10-CM | POA: Diagnosis present

## 2020-05-09 DIAGNOSIS — I083 Combined rheumatic disorders of mitral, aortic and tricuspid valves: Secondary | ICD-10-CM | POA: Insufficient documentation

## 2020-05-09 DIAGNOSIS — E1169 Type 2 diabetes mellitus with other specified complication: Secondary | ICD-10-CM | POA: Diagnosis present

## 2020-05-09 DIAGNOSIS — I152 Hypertension secondary to endocrine disorders: Secondary | ICD-10-CM | POA: Diagnosis present

## 2020-05-09 DIAGNOSIS — I739 Peripheral vascular disease, unspecified: Secondary | ICD-10-CM

## 2020-05-09 DIAGNOSIS — I110 Hypertensive heart disease with heart failure: Secondary | ICD-10-CM | POA: Diagnosis not present

## 2020-05-09 DIAGNOSIS — I25118 Atherosclerotic heart disease of native coronary artery with other forms of angina pectoris: Secondary | ICD-10-CM | POA: Insufficient documentation

## 2020-05-09 DIAGNOSIS — I70229 Atherosclerosis of native arteries of extremities with rest pain, unspecified extremity: Secondary | ICD-10-CM | POA: Diagnosis present

## 2020-05-09 HISTORY — PX: LEFT HEART CATH AND CORONARY ANGIOGRAPHY: CATH118249

## 2020-05-09 HISTORY — PX: PERIPHERAL VASCULAR INTERVENTION: CATH118257

## 2020-05-09 HISTORY — PX: ABDOMINAL AORTOGRAM W/LOWER EXTREMITY: CATH118223

## 2020-05-09 LAB — POCT ACTIVATED CLOTTING TIME
Activated Clotting Time: 249 seconds
Activated Clotting Time: 172 seconds

## 2020-05-09 LAB — GLUCOSE, CAPILLARY
Glucose-Capillary: 181 mg/dL — ABNORMAL HIGH (ref 70–99)
Glucose-Capillary: 190 mg/dL — ABNORMAL HIGH (ref 70–99)
Glucose-Capillary: 215 mg/dL — ABNORMAL HIGH (ref 70–99)

## 2020-05-09 SURGERY — ABDOMINAL AORTOGRAM W/LOWER EXTREMITY
Anesthesia: LOCAL

## 2020-05-09 MED ORDER — AMLODIPINE BESYLATE 5 MG PO TABS
5.0000 mg | ORAL_TABLET | Freq: Every day | ORAL | Status: DC
  Administered 2020-05-10: 5 mg via ORAL
  Filled 2020-05-09: qty 1

## 2020-05-09 MED ORDER — INSULIN ASPART (W/NIACINAMIDE) 100 UNIT/ML ~~LOC~~ SOPN: 40.0000 [IU] | PEN_INJECTOR | Freq: Three times a day (TID) | SUBCUTANEOUS | Status: DC

## 2020-05-09 MED ORDER — SODIUM CHLORIDE 0.9 % IV SOLN
250.0000 mL | INTRAVENOUS | Status: DC | PRN
Start: 2020-05-09 — End: 2020-05-10

## 2020-05-09 MED ORDER — ASCORBIC ACID 500 MG PO TABS
1000.0000 mg | ORAL_TABLET | Freq: Three times a day (TID) | ORAL | Status: DC
  Administered 2020-05-09 – 2020-05-10 (×2): 1000 mg via ORAL
  Filled 2020-05-09 (×2): qty 2

## 2020-05-09 MED ORDER — FENTANYL CITRATE (PF) 100 MCG/2ML IJ SOLN
INTRAMUSCULAR | Status: AC
  Filled 2020-05-09: qty 2

## 2020-05-09 MED ORDER — SPIRONOLACTONE 25 MG PO TABS
25.0000 mg | ORAL_TABLET | Freq: Every day | ORAL | Status: DC
  Administered 2020-05-10: 25 mg via ORAL
  Filled 2020-05-09: qty 1

## 2020-05-09 MED ORDER — INSULIN ASPART 100 UNIT/ML ~~LOC~~ SOLN
0.0000 [IU] | Freq: Three times a day (TID) | SUBCUTANEOUS | Status: DC
  Administered 2020-05-10: 3 [IU] via SUBCUTANEOUS
  Administered 2020-05-10: 8 [IU] via SUBCUTANEOUS

## 2020-05-09 MED ORDER — CLOPIDOGREL BISULFATE 75 MG PO TABS
75.0000 mg | ORAL_TABLET | Freq: Every day | ORAL | Status: DC
  Administered 2020-05-10: 75 mg via ORAL
  Filled 2020-05-09: qty 1

## 2020-05-09 MED ORDER — NITROGLYCERIN 0.4 MG SL SUBL
0.4000 mg | SUBLINGUAL_TABLET | SUBLINGUAL | Status: DC | PRN
  Administered 2020-05-09 (×2): 0.4 mg via SUBLINGUAL
  Filled 2020-05-09: qty 1

## 2020-05-09 MED ORDER — OMEGA-3 FATTY ACIDS 1000 MG PO CAPS: 1.0000 g | ORAL_CAPSULE | Freq: Every day | ORAL | Status: DC

## 2020-05-09 MED ORDER — HEPARIN SODIUM (PORCINE) 1000 UNIT/ML IJ SOLN
INTRAMUSCULAR | Status: AC
  Filled 2020-05-09: qty 1

## 2020-05-09 MED ORDER — CEPHALEXIN 500 MG PO CAPS
500.0000 mg | ORAL_CAPSULE | Freq: Three times a day (TID) | ORAL | Status: DC
  Administered 2020-05-09 – 2020-05-10 (×2): 500 mg via ORAL
  Filled 2020-05-09 (×2): qty 1

## 2020-05-09 MED ORDER — CARVEDILOL 3.125 MG PO TABS
6.2500 mg | ORAL_TABLET | Freq: Two times a day (BID) | ORAL | Status: DC
  Administered 2020-05-09 – 2020-05-10 (×2): 6.25 mg via ORAL
  Filled 2020-05-09: qty 2

## 2020-05-09 MED ORDER — ASPIRIN EC 81 MG PO TBEC
81.0000 mg | DELAYED_RELEASE_TABLET | Freq: Every day | ORAL | Status: DC
  Administered 2020-05-10: 81 mg via ORAL
  Filled 2020-05-09: qty 1

## 2020-05-09 MED ORDER — HYDRALAZINE HCL 20 MG/ML IJ SOLN
10.0000 mg | INTRAMUSCULAR | Status: DC | PRN
  Administered 2020-05-09: 10 mg via INTRAVENOUS
  Filled 2020-05-09: qty 1

## 2020-05-09 MED ORDER — CALCIUM CARBONATE-VITAMIN D 500-200 MG-UNIT PO TABS
1.0000 | ORAL_TABLET | Freq: Three times a day (TID) | ORAL | Status: DC
  Filled 2020-05-09: qty 1

## 2020-05-09 MED ORDER — MIDAZOLAM HCL 2 MG/2ML IJ SOLN
INTRAMUSCULAR | Status: DC | PRN
  Administered 2020-05-09 (×2): 1 mg via INTRAVENOUS

## 2020-05-09 MED ORDER — SEMAGLUTIDE(0.25 OR 0.5MG/DOS) 2 MG/1.5ML ~~LOC~~ SOPN: 1.0000 mg | PEN_INJECTOR | SUBCUTANEOUS | Status: DC

## 2020-05-09 MED ORDER — HEPARIN SODIUM (PORCINE) 1000 UNIT/ML IJ SOLN
INTRAMUSCULAR | Status: DC | PRN
  Administered 2020-05-09: 7000 [IU] via INTRAVENOUS

## 2020-05-09 MED ORDER — LABETALOL HCL 5 MG/ML IV SOLN
10.0000 mg | INTRAVENOUS | Status: DC | PRN
  Administered 2020-05-09: 10 mg via INTRAVENOUS
  Filled 2020-05-09 (×2): qty 4

## 2020-05-09 MED ORDER — ASPIRIN 81 MG PO CHEW: 81.0000 mg | CHEWABLE_TABLET | ORAL | Status: DC

## 2020-05-09 MED ORDER — ACETAMINOPHEN 325 MG PO TABS
650.0000 mg | ORAL_TABLET | Freq: Four times a day (QID) | ORAL | Status: DC | PRN
  Administered 2020-05-09 – 2020-05-10 (×2): 650 mg via ORAL
  Filled 2020-05-09 (×2): qty 2

## 2020-05-09 MED ORDER — LABETALOL HCL 5 MG/ML IV SOLN
INTRAVENOUS | Status: AC
  Filled 2020-05-09: qty 4

## 2020-05-09 MED ORDER — LIDOCAINE HCL (PF) 1 % IJ SOLN
INTRAMUSCULAR | Status: DC | PRN
  Administered 2020-05-09 (×2): 15 mL via INTRADERMAL

## 2020-05-09 MED ORDER — ROSUVASTATIN CALCIUM 20 MG PO TABS
40.0000 mg | ORAL_TABLET | Freq: Every day | ORAL | Status: DC
  Administered 2020-05-10: 40 mg via ORAL
  Filled 2020-05-09: qty 1
  Filled 2020-05-09: qty 2

## 2020-05-09 MED ORDER — LISINOPRIL 10 MG PO TABS
20.0000 mg | ORAL_TABLET | Freq: Two times a day (BID) | ORAL | Status: DC
  Administered 2020-05-10: 20 mg via ORAL
  Filled 2020-05-09 (×2): qty 2

## 2020-05-09 MED ORDER — SODIUM CHLORIDE 0.9 % WEIGHT BASED INFUSION
3.0000 mL/kg/h | INTRAVENOUS | Status: DC
  Administered 2020-05-09: 3 mL/kg/h via INTRAVENOUS

## 2020-05-09 MED ORDER — CALTRATE 600+D PLUS MINERALS 600-800 MG-UNIT PO TABS: 1.0000 | ORAL_TABLET | Freq: Three times a day (TID) | ORAL | Status: DC

## 2020-05-09 MED ORDER — ALBUTEROL SULFATE HFA 108 (90 BASE) MCG/ACT IN AERS
1.0000 | INHALATION_SPRAY | Freq: Four times a day (QID) | RESPIRATORY_TRACT | Status: DC | PRN
  Filled 2020-05-09: qty 6.7

## 2020-05-09 MED ORDER — SODIUM CHLORIDE 0.9% FLUSH: 3.0000 mL | INTRAVENOUS | Status: DC | PRN

## 2020-05-09 MED ORDER — HEPARIN (PORCINE) IN NACL 1000-0.9 UT/500ML-% IV SOLN
INTRAVENOUS | Status: AC
  Filled 2020-05-09: qty 1000

## 2020-05-09 MED ORDER — HEPARIN (PORCINE) IN NACL 1000-0.9 UT/500ML-% IV SOLN
INTRAVENOUS | Status: DC | PRN
  Administered 2020-05-09 (×2): 500 mL

## 2020-05-09 MED ORDER — HYPROMELLOSE (GONIOSCOPIC) 2.5 % OP SOLN
1.0000 [drp] | Freq: Three times a day (TID) | OPHTHALMIC | Status: DC | PRN
  Filled 2020-05-09: qty 15

## 2020-05-09 MED ORDER — FENTANYL CITRATE (PF) 100 MCG/2ML IJ SOLN
INTRAMUSCULAR | Status: DC | PRN
  Administered 2020-05-09 (×4): 50 ug via INTRAVENOUS

## 2020-05-09 MED ORDER — VITAMIN D3 25 MCG (1000 UNIT) PO TABS
5000.0000 [IU] | ORAL_TABLET | Freq: Every day | ORAL | Status: DC
  Administered 2020-05-09 – 2020-05-10 (×2): 5000 [IU] via ORAL
  Filled 2020-05-09 (×4): qty 5

## 2020-05-09 MED ORDER — SODIUM CHLORIDE 0.9 % WEIGHT BASED INFUSION
1.0000 mL/kg/h | INTRAVENOUS | Status: DC
  Administered 2020-05-09: 1 mL/kg/h via INTRAVENOUS

## 2020-05-09 MED ORDER — LIDOCAINE HCL (PF) 1 % IJ SOLN
INTRAMUSCULAR | Status: AC
  Filled 2020-05-09: qty 30

## 2020-05-09 MED ORDER — MONTELUKAST SODIUM 10 MG PO TABS
10.0000 mg | ORAL_TABLET | Freq: Every day | ORAL | Status: DC
  Administered 2020-05-09: 10 mg via ORAL
  Filled 2020-05-09: qty 1

## 2020-05-09 MED ORDER — SODIUM CHLORIDE 0.9% FLUSH
3.0000 mL | Freq: Two times a day (BID) | INTRAVENOUS | Status: DC
  Administered 2020-05-09 – 2020-05-10 (×2): 3 mL via INTRAVENOUS

## 2020-05-09 MED ORDER — HYDRALAZINE HCL 20 MG/ML IJ SOLN
INTRAMUSCULAR | Status: AC
  Filled 2020-05-09: qty 1

## 2020-05-09 MED ORDER — SODIUM CHLORIDE 0.9 % IV SOLN: INTRAVENOUS | Status: AC

## 2020-05-09 MED ORDER — MIDAZOLAM HCL 2 MG/2ML IJ SOLN
INTRAMUSCULAR | Status: AC
  Filled 2020-05-09: qty 2

## 2020-05-09 MED ORDER — LABETALOL HCL 5 MG/ML IV SOLN
INTRAVENOUS | Status: DC | PRN
Start: 2020-05-09 — End: 2020-05-09
  Administered 2020-05-09 (×2): 10 mg via INTRAVENOUS

## 2020-05-09 MED ORDER — OMEGA-3-ACID ETHYL ESTERS 1 G PO CAPS
1.0000 g | ORAL_CAPSULE | Freq: Every day | ORAL | Status: DC
  Administered 2020-05-09 – 2020-05-10 (×2): 1 g via ORAL
  Filled 2020-05-09 (×2): qty 1

## 2020-05-09 MED ORDER — HYDRALAZINE HCL 20 MG/ML IJ SOLN
INTRAMUSCULAR | Status: DC | PRN
  Administered 2020-05-09: 10 mg via INTRAVENOUS

## 2020-05-09 MED ORDER — SODIUM CHLORIDE 0.9% FLUSH
3.0000 mL | Freq: Two times a day (BID) | INTRAVENOUS | Status: DC
  Administered 2020-05-09: 3 mL via INTRAVENOUS

## 2020-05-09 MED ORDER — SODIUM CHLORIDE 0.9 % IV SOLN: 250.0000 mL | INTRAVENOUS | Status: DC | PRN

## 2020-05-09 MED ORDER — ONDANSETRON HCL 4 MG/2ML IJ SOLN
4.0000 mg | Freq: Four times a day (QID) | INTRAMUSCULAR | Status: DC | PRN
  Administered 2020-05-09: 4 mg via INTRAVENOUS
  Filled 2020-05-09: qty 2

## 2020-05-09 SURGICAL SUPPLY — 29 items
BALLN MUSTANG 6.0X20 75 (BALLOONS) ×3
BALLOON MUSTANG 6.0X20 75 (BALLOONS) IMPLANT
CATH ANGIO 5F PIGTAIL 65CM (CATHETERS) ×1 IMPLANT
CATH CROSS OVER TEMPO 5F (CATHETERS) ×1 IMPLANT
CATH INFINITI 5 FR JL3.5 (CATHETERS) ×1 IMPLANT
CATH INFINITI 5FR MULTPACK ANG (CATHETERS) ×1 IMPLANT
CATH SOFT-VU 4F 65 STRAIGHT (CATHETERS) IMPLANT
CATH SOFT-VU STRAIGHT 4F 65CM (CATHETERS) ×3
CATH STRAIGHT 5FR 65CM (CATHETERS) ×1 IMPLANT
DEVICE CLOSURE MYNXGRIP 6/7F (Vascular Products) ×1 IMPLANT
GUIDEWIRE ANGLED .035X150CM (WIRE) ×1 IMPLANT
KIT ENCORE 40 (KITS) ×2 IMPLANT
KIT MICROPUNCTURE NIT STIFF (SHEATH) ×1 IMPLANT
KIT PV (KITS) ×3 IMPLANT
SHEATH BRITE TIP 6FR 35CM (SHEATH) ×1 IMPLANT
SHEATH BRITE TIP 7FR 35CM (SHEATH) ×1 IMPLANT
SHEATH PINNACLE 5F 10CM (SHEATH) ×1 IMPLANT
SHEATH PINNACLE 6F 10CM (SHEATH) ×1 IMPLANT
SHEATH PINNACLE 7F 10CM (SHEATH) ×1 IMPLANT
SHEATH PROBE COVER 6X72 (BAG) ×1 IMPLANT
STENT EXPRESS LD 8X27X75 (Permanent Stent) ×1 IMPLANT
STENT EXPRESS LD 9X37X75 (Permanent Stent) ×1 IMPLANT
STOPCOCK MORSE 400PSI 3WAY (MISCELLANEOUS) ×1 IMPLANT
SYR MEDRAD MARK 7 150ML (SYRINGE) ×3 IMPLANT
TRANSDUCER W/STOPCOCK (MISCELLANEOUS) ×3 IMPLANT
TRAY PV CATH (CUSTOM PROCEDURE TRAY) ×3 IMPLANT
TUBING CIL FLEX 10 FLL-RA (TUBING) ×3 IMPLANT
WIRE HITORQ VERSACORE ST 145CM (WIRE) ×2 IMPLANT
WIRE J 3MM .035X145CM (WIRE) ×1 IMPLANT

## 2020-05-09 NOTE — Progress Notes (Incomplete)
Patient c/o right sided chest pain radiating to back, rated 3-4/10 pain scale, described as "pressure" pain. Denies SOB , Sb/p 100's -110's.NTG SL given , MD on call ,Dr Launa Grill notified .  Stat ECG done., result relayed to latter MD. Md seen Leah Ferrell patient in room. Repeat EKG  After 1 hr ordered for reevaluation . Repeat EKG completed, result relayed to above MD.

## 2020-05-09 NOTE — Progress Notes (Signed)
Site area: left groin  Site Prior to Removal:  Level 0  Pressure Applied For 20 MINUTES    Minutes Beginning at 1925  Manual:   Yes.    Patient Status During Pull:  stable  Post Pull Groin Site:  Level 0  Post Pull Instructions Given:  Yes.    Post Pull Pulses Present:  Yes.    Dressing Applied:  Yes.    Comments:

## 2020-05-09 NOTE — Plan of Care (Signed)

## 2020-05-09 NOTE — Interval H&P Note (Signed)
History and Physical Interval Note:  05/09/2020 3:24 PM  Leah Ferrell  has presented today for surgery, with the diagnosis of peripheral vascular disease.  The various methods of treatment have been discussed with the patient and family. After consideration of risks, benefits and other options for treatment, the patient has consented to  Procedure(s): ABDOMINAL AORTOGRAM W/LOWER EXTREMITY (N/A) LEFT HEART CATH AND CORONARY ANGIOGRAPHY (N/A) as a surgical intervention.  The patient's history has been reviewed, patient examined, no change in status, stable for surgery.  I have reviewed the patient's chart and labs.  Questions were answered to the patient's satisfaction.     [name]

## 2020-05-09 NOTE — Progress Notes (Signed)
Patient c/o right side chest pain radiating to back, rated 3-4/10 pain scale, described as "pressure" pain. Denies SOB , Sb/p 100's -110's.NTG SL x2 given , MD on call ,Dr Launa Grill notified .  Stat ECG done , result relayed to latter MD. Md seen Leah Ferrell patient in room. Repeat EKG  After 1 hr ordered for reevaluation . Repeat EKG completed, result relayed to above MD.   No new order obtained., continue to monitor patient., V/s stable., patient resting with no discomforts.

## 2020-05-10 ENCOUNTER — Ambulatory Visit (HOSPITAL_BASED_OUTPATIENT_CLINIC_OR_DEPARTMENT_OTHER): Payer: Medicare Other

## 2020-05-10 ENCOUNTER — Encounter (HOSPITAL_COMMUNITY): Payer: Self-pay | Admitting: Cardiovascular Disease

## 2020-05-10 DIAGNOSIS — E785 Hyperlipidemia, unspecified: Secondary | ICD-10-CM | POA: Diagnosis not present

## 2020-05-10 DIAGNOSIS — I110 Hypertensive heart disease with heart failure: Secondary | ICD-10-CM | POA: Diagnosis not present

## 2020-05-10 DIAGNOSIS — R0600 Dyspnea, unspecified: Secondary | ICD-10-CM | POA: Diagnosis not present

## 2020-05-10 DIAGNOSIS — T82855A Stenosis of coronary artery stent, initial encounter: Secondary | ICD-10-CM | POA: Diagnosis not present

## 2020-05-10 DIAGNOSIS — I70229 Atherosclerosis of native arteries of extremities with rest pain, unspecified extremity: Secondary | ICD-10-CM

## 2020-05-10 DIAGNOSIS — I340 Nonrheumatic mitral (valve) insufficiency: Secondary | ICD-10-CM | POA: Diagnosis not present

## 2020-05-10 DIAGNOSIS — I342 Nonrheumatic mitral (valve) stenosis: Secondary | ICD-10-CM

## 2020-05-10 DIAGNOSIS — I25118 Atherosclerotic heart disease of native coronary artery with other forms of angina pectoris: Secondary | ICD-10-CM | POA: Diagnosis not present

## 2020-05-10 DIAGNOSIS — I5032 Chronic diastolic (congestive) heart failure: Secondary | ICD-10-CM | POA: Diagnosis not present

## 2020-05-10 DIAGNOSIS — I083 Combined rheumatic disorders of mitral, aortic and tricuspid valves: Secondary | ICD-10-CM | POA: Diagnosis not present

## 2020-05-10 DIAGNOSIS — I70235 Atherosclerosis of native arteries of right leg with ulceration of other part of foot: Secondary | ICD-10-CM | POA: Diagnosis not present

## 2020-05-10 DIAGNOSIS — E1151 Type 2 diabetes mellitus with diabetic peripheral angiopathy without gangrene: Secondary | ICD-10-CM | POA: Diagnosis not present

## 2020-05-10 DIAGNOSIS — S99921A Unspecified injury of right foot, initial encounter: Secondary | ICD-10-CM | POA: Diagnosis not present

## 2020-05-10 DIAGNOSIS — I150 Renovascular hypertension: Secondary | ICD-10-CM | POA: Diagnosis not present

## 2020-05-10 DIAGNOSIS — L97519 Non-pressure chronic ulcer of other part of right foot with unspecified severity: Secondary | ICD-10-CM | POA: Diagnosis not present

## 2020-05-10 LAB — BASIC METABOLIC PANEL
Anion gap: 9 (ref 5–15)
BUN: 14 mg/dL (ref 8–23)
CO2: 25 mmol/L (ref 22–32)
Calcium: 9.2 mg/dL (ref 8.9–10.3)
Chloride: 99 mmol/L (ref 98–111)
Creatinine, Ser: 0.76 mg/dL (ref 0.44–1.00)
GFR, Estimated: >60 mL/min (ref >60)
Glucose, Bld: 172 mg/dL — ABNORMAL HIGH (ref 70–99)
Potassium: 4.4 mmol/L (ref 3.5–5.1)
Sodium: 133 mmol/L — ABNORMAL LOW (ref 135–145)

## 2020-05-10 LAB — CBC
HCT: 38.2 % (ref 36.0–46.0)
Hemoglobin: 12.8 g/dL (ref 12.0–15.0)
MCH: 31.9 pg (ref 26.0–34.0)
MCHC: 33.5 g/dL (ref 30.0–36.0)
MCV: 95.3 fL (ref 80.0–100.0)
Platelets: 261 10*3/uL (ref 150–400)
RBC: 4.01 MIL/uL (ref 3.87–5.11)
RDW: 14.1 % (ref 11.5–15.5)
WBC: 11.4 10*3/uL — ABNORMAL HIGH (ref 4.0–10.5)
nRBC: 0 % (ref 0.0–0.2)

## 2020-05-10 LAB — ECHOCARDIOGRAM COMPLETE
AR max vel: 2.37 cm2
AV Area VTI: 2.44 cm2
AV Area mean vel: 2.26 cm2
AV Mean grad: 6 mmHg
AV Peak grad: 10.5 mmHg
Ao pk vel: 1.62 m/s
Area-P 1/2: 3.21 cm2
Height: 62.5 in
MV VTI: 2.07 cm2
S' Lateral: 3 cm
Weight: 3280 oz

## 2020-05-10 LAB — HEMOGLOBIN A1C
Hgb A1c MFr Bld: 8.3 % — ABNORMAL HIGH (ref 4.8–5.6)
Mean Plasma Glucose: 191.51 mg/dL

## 2020-05-10 LAB — GLUCOSE, CAPILLARY
Glucose-Capillary: 156 mg/dL — ABNORMAL HIGH (ref 70–99)
Glucose-Capillary: 252 mg/dL — ABNORMAL HIGH (ref 70–99)

## 2020-05-10 MED ORDER — NITROGLYCERIN 0.4 MG SL SUBL: 0.4000 mg | SUBLINGUAL_TABLET | SUBLINGUAL | 12 refills | Status: DC | PRN

## 2020-05-10 NOTE — Plan of Care (Signed)

## 2020-05-10 NOTE — Discharge Summary (Addendum)
The patient has been seen in conjunction with Vin Bhagat, PAC. All aspects of care have been considered and discussed. The patient has been personally interviewed, examined, and all clinical data has been reviewed.   Echo okay.  No angina.  Ambulated without difficulty.  Plan CABG in future. 2 weeks to allow Plavix washout.  Discharge Summary    Patient ID: Leah Ferrell MRN: [id number]; DOB: October 30, 1950  Admit date: 05/09/2020 Discharge date: 05/10/2020  PCP:  Sharion Balloon, [place]  Cardiologist:  [name], MD    Discharge Diagnoses    Principal Problem:   Critical lower limb ischemia [facility]) Active Problems:   Hypertension associated with diabetes ([place])   Diabetes mellitus with peripheral vascular disease ([place])   Hyperlipidemia associated with type 2 diabetes mellitus ([place])   CAD (coronary artery disease)   PVD (peripheral vascular disease) ([place])   Chronic diastolic heart failure ([place])  Diagnostic Studies/Procedures    Echo 05/10/20 1. Left ventricular ejection fraction, by estimation, is 60 to 65%. The  left ventricle has normal function. The left ventricle has no regional  wall motion abnormalities. There is moderate concentric left ventricular  hypertrophy. Left ventricular  diastolic parameters are consistent with Grade I diastolic dysfunction  (impaired relaxation). Elevated left atrial pressure.  2. Right ventricular systolic function is normal. The right ventricular  size is normal. There is normal pulmonary artery systolic pressure.  3. Left atrial size was mildly dilated.  4. The mitral valve is normal in structure. Mild mitral valve  regurgitation. Mild mitral stenosis.  5. The aortic valve is normal in structure. Aortic valve regurgitation is  not visualized. No aortic stenosis is present.  6. The inferior vena cava is normal in size with <50% respiratory  variability, suggesting right atrial  pressure of 8 mmHg.   ABDOMINAL AORTOGRAM W/LOWER EXTREMITY  05/09/20  LEFT HEART CATH AND CORONARY ANGIOGRAPHY  PERIPHERAL VASCULAR INTERVENTION    Conclusion    Prox RCA to Mid RCA lesion is 40% stenosed.  Mid RCA to Dist RCA lesion is 40% stenosed.  Ost LAD to Prox LAD lesion is 80% stenosed.  Mid LAD lesion is 30% stenosed.  2nd Diag lesion is 95% stenosed.  Ost Cx to Prox Cx lesion is 95% stenosed.  1. Heavily calcified coronary arteries with significant three-vessel coronary artery disease. Patent stent in the LAD particular is in-stent restenosis in the proximal stent. In addition, there is severe ostial stenosis in a large second diagonal, severe ostial stenosis in the left circumflex which is heavily calcified and moderate diffuse disease in the right coronary artery. 2. Left ventricular angiography was not performed. LVEDP was normal. 3. Patent bilateral renal artery stents with significant restenosis in the right stent. 4. Severe bilateral heavily calcified ostial common iliac artery disease extending into the distal aorta. 5. Right lower extremity: Moderate common femoral artery disease, flush occlusion of the SFA with reconstitution distally via collaterals from the profunda, short occlusion and above-the-knee popliteal artery with collaterals and three-vessel runoff below the knee. 6. Left lower extremity: Flush occlusion of the SFA with collaterals from the profunda and two-vessel runoff below the knee. 7. Successful bilateral kissing stent placement of common iliac arteries extending into the distal aorta.  Recommendations: Continue dual antiplatelet therapy for at least 2 weeks. From a cardiac standpoint, recommend evaluation for CABG in few weeks from now once she is no longer in need of clopidogrel. This can be  done as an outpatient. I am going to obtain an echocardiogram to finish her cardiac evaluation. From PAD standpoint, I am hopeful that she  will be able to heal the wound on her right lower extremity after today's intervention. However, if that does not happen, the patient will need right femoral popliteal bypass after CABG. Given length of procedure, bilateral access and contrast load, will observe the patient overnight with possible discharge home tomorrow.  Diagnostic Dominance: Right        History of Present Illness     Leah Ferrell is a 70 y.o. female with hx of coronary artery disease status post drug-eluting stent placement to the LAD X 2, peripheral arterial disease s/p prior intervention, hypertension, hyperlipidemia, morbid obesity, diastolic heart failure and diabetes mellitus presented for outpatient cardiac catheterization (abnormal stress test) and perivalvular vascular intervention.   She is known to have peripheral arterial disease status post left common iliac artery stent placement followed by left common femoral artery resection and bypass from left external iliac to the left profunda done by Dr. Kellie Simmering in 2015. She is known to have chronically occluded bilateral SFAs being managed medically.  She had worsening left leg claudication in 2018 with elevated velocity at the proximal anastomosis of the graft.  Angiography in March 2018 showed mild to moderate calcified common iliac artery disease with patent stent in the left common iliac artery. There was severe stenosis in the left common femoral artery at the proximal anastomosis of the bypass to the profunda. Dr. Fletcher [name] performed successful angioplasty and drug-coated balloon angioplasty.  She is status post bilateral renal artery stenting in January 2021 due to refractory hypertension and recurrent heart failure.  She had significant improvement in blood pressure control as well as heart failure symptoms since then.    Over the last year, she experienced worsening right calf claudication.  Also, she had a traumatic injury in November which resulted in some  ligament problems in the right foot and then she developed a small ulceration on the lateral side of the small toe.  She has significant discoloration in all toes with significant pain.  Most recent vascular studies showed moderately reduced ABI bilaterally.  Toe pressure on the right side was not detectable.  In addition, she has been experiencing intermittent chest pain and tightness lasting for few minutes both at rest and with exertion.  It happens mostly when she is stressed. Follow up stress test showed prior anterolateral/lateral/inferolateral myocardial infarction with large area of peri-infarct ischemia.   Hospital Course     Consultants: None  1.  Peripheral arterial disease -Peripheral vascular angiography is summarized above.  S/p Successful bilateral kissing stent placement of common iliac arteries extending into the distal aorta.  -Plan for dual antiplatelet therapy for at least 2 weeks  2.  Unstable angina with CAD -Cath showed heavily calcified coronary arteries with significant three-vessel coronary artery disease. Patent stent in the LAD particular is in-stent restenosis in the proximal stent. In addition, there is severe ostial stenosis in a large second diagonal, severe ostial stenosis in the left circumflex which is heavily calcified and moderate diffuse disease in the right coronary artery. -Plan for outpatient CABG evaluation in few weeks -Right-sided chest pain overnight which resolved after sublingual nitroglycerin x2. No reoccurrence. Ambulated well. Usually does not required to take SL nitro at thome.  -Aspirin, Plavix, statin and beta-blocker -stable renal function and hemoglobin - Echo showed preserved LVEF with grade 1 DD. No WM abnormality.  3. HTN -Continue home meds    4. HLD - 03/20/2020: Cholesterol, Total 150; HDL 60; LDL Chol Calc (NIH) 66; Triglycerides 137  -Continue Crestor   5.  Diabetes mellitus -Sliding scale insulins while admitted -  Intolerance to Metformin - Continue home regimen  Did the patient have an acute coronary syndrome (MI, NSTEMI, STEMI, etc) this admission?:  No                               Did the patient have a percutaneous coronary intervention (stent / angioplasty)?:  No   Has PV intervention done.    Discharge Vitals Blood pressure (!) 142/57, pulse 73, temperature 98.1 F (36.7 C), temperature source Oral, resp. rate 17, height 5' 2.5" (1.588 m), weight 93 kg, SpO2 99 %.  Filed Weights   05/09/20 1217 05/10/20 0310  Weight: 91.6 kg 93 kg    Labs & Radiologic Studies    CBC Recent Labs    05/10/20 0815  WBC 11.4*  HGB 12.8  HCT 38.2  MCV 95.3  PLT 355   Basic Metabolic Panel Recent Labs    05/10/20 0815  NA 133*  K 4.4  CL 99  CO2 25  GLUCOSE 172*  BUN 14  CREATININE 0.76  CALCIUM 9.2   Hemoglobin A1C Recent Labs    05/10/20 0209  HGBA1C 8.3*  _____________  CARDIAC CATHETERIZATION  Result Date: 05/09/2020  Prox RCA to Mid RCA lesion is 40% stenosed.  Mid RCA to Dist RCA lesion is 40% stenosed.  Ost LAD to Prox LAD lesion is 80% stenosed.  Mid LAD lesion is 30% stenosed.  2nd Diag lesion is 95% stenosed.  Ost Cx to Prox Cx lesion is 95% stenosed.  1.  Heavily calcified coronary arteries with significant three-vessel coronary artery disease.  Patent stent in the LAD particular is in-stent restenosis in the proximal stent.  In addition, there is severe ostial stenosis in a large second diagonal, severe ostial stenosis in the left circumflex which is heavily calcified and moderate diffuse disease in the right coronary artery. 2.  Left ventricular angiography was not performed.  LVEDP was normal. 3.  Patent bilateral renal artery stents with significant restenosis in the right stent. 4.  Severe bilateral heavily calcified ostial common iliac artery disease extending into the distal aorta. 5.  Right lower extremity: Moderate common femoral artery disease, flush occlusion of the  SFA with reconstitution distally via collaterals from the profunda, short occlusion and above-the-knee popliteal artery with collaterals and three-vessel runoff below the knee. 6.  Left lower extremity: Flush occlusion of the SFA with collaterals from the profunda and two-vessel runoff below the knee. 7.  Successful bilateral kissing stent placement of common iliac arteries extending into the distal aorta. Recommendations: Continue dual antiplatelet therapy for at least 2 weeks. From a cardiac standpoint, recommend evaluation for CABG in few weeks from now once she is no longer in need of clopidogrel.  This can be done as an outpatient.  I am going to obtain an echocardiogram to finish her cardiac evaluation. From PAD standpoint, I am hopeful that she will be able to heal the wound on her right lower extremity after today's intervention.  However, if that does not happen, the patient will need right femoral popliteal bypass after CABG. Given length of procedure, bilateral access and contrast load, will observe the patient overnight with possible discharge home tomorrow.   PERIPHERAL VASCULAR  CATHETERIZATION  Result Date: 05/09/2020  Prox RCA to Mid RCA lesion is 40% stenosed.  Mid RCA to Dist RCA lesion is 40% stenosed.  Ost LAD to Prox LAD lesion is 80% stenosed.  Mid LAD lesion is 30% stenosed.  2nd Diag lesion is 95% stenosed.  Ost Cx to Prox Cx lesion is 95% stenosed.  1.  Heavily calcified coronary arteries with significant three-vessel coronary artery disease.  Patent stent in the LAD particular is in-stent restenosis in the proximal stent.  In addition, there is severe ostial stenosis in a large second diagonal, severe ostial stenosis in the left circumflex which is heavily calcified and moderate diffuse disease in the right coronary artery. 2.  Left ventricular angiography was not performed.  LVEDP was normal. 3.  Patent bilateral renal artery stents with significant restenosis in the right stent.  4.  Severe bilateral heavily calcified ostial common iliac artery disease extending into the distal aorta. 5.  Right lower extremity: Moderate common femoral artery disease, flush occlusion of the SFA with reconstitution distally via collaterals from the profunda, short occlusion and above-the-knee popliteal artery with collaterals and three-vessel runoff below the knee. 6.  Left lower extremity: Flush occlusion of the SFA with collaterals from the profunda and two-vessel runoff below the knee. 7.  Successful bilateral kissing stent placement of common iliac arteries extending into the distal aorta. Recommendations: Continue dual antiplatelet therapy for at least 2 weeks. From a cardiac standpoint, recommend evaluation for CABG in few weeks from now once she is no longer in need of clopidogrel.  This can be done as an outpatient.  I am going to obtain an echocardiogram to finish her cardiac evaluation. From PAD standpoint, I am hopeful that she will be able to heal the wound on her right lower extremity after today's intervention.  However, if that does not happen, the patient will need right femoral popliteal bypass after CABG. Given length of procedure, bilateral access and contrast load, will observe the patient overnight with possible discharge home tomorrow.   NM Myocar Multi W/Spect W/Wall Motion / EF  Result Date: 05/07/2020  Findings consistent with prior anterolateral/lateral/inferolateral myocardial infarction with large area of peri-infarct ischemia.  This is a high risk study based on degree of peri-infarct ischemia.  The left ventricular ejection fraction is normal (55-65%).    US ARTERIAL SEG MULTIPLE LE (ABI, SEGMENTAL PRESSURES, PVR'S)  Result Date: 04/26/2020 CLINICAL DATA:  Atherosclerosis of native arteries of extremities with rest pain Left lower extremity resection. Left external iliac artery to Profunda femoris stenting in 2015. EXAM: NONINVASIVE PHYSIOLOGIC VASCULAR STUDY OF  BILATERAL LOWER EXTREMITIES TECHNIQUE: Non-invasive vascular evaluation of both lower extremities was performed at rest, including calculation of ankle-brachial indices, multiple segmental pressure evaluation, segmental Doppler and segmental pulse volume recording. COMPARISON:  None. FINDINGS: Right Lower Extremity Resting ABI:  0.61 Segmental Pressures: Normal segmental pressures, no significant (20 mmHg) pressure gradient between adjacent segments. Arterial Waveforms: Monophasic waveform seen throughout the right lower extremity. PVRs: Normal PVRs with maintained waveform amplitude, augmentation and quality. Left Lower Extremity: Resting ABI: 0.59 Segmental Pressures: Normal segmental pressures, no significant (20 mmHg) pressure gradient between adjacent segments. Arterial Waveforms: Monophasic waveform seen throughout the left lower extremity. PVRs: Normal PVRs with maintained waveform amplitude, augmentation and quality. Other: Symmetric upper extremity pressures. Ankle Brachial index > 1.4 Non diagnostic secondary to incompressible vessel calcifications 1.0-1.4       Normal 0.9-0.99     Borderline PAD 0.8-0.89  Mild PAD 0.5-0.79     Moderate PAD < 0.5          Severe PAD Toe Brachial Index Normal     >0.65 Moderate  0.53-0.64 Severe     <0.23 Toe Pressures Absolute toe pressure >62mHg sufficient for wound healing. Toe pressures <567mg = critical limb ischemia. IMPRESSION: Findings consistent with significant bilateral arterial occlusive disease. There is likely both inflow and outflow disease. Electronically Signed   By: FaMiachel Roux.D.   On: 04/26/2020 14:29   ECHOCARDIOGRAM COMPLETE  Result Date: 05/10/2020    ECHOCARDIOGRAM REPORT   Patient Name:   Leah ABBEYate of Exam: 05/10/2020 Medical Rec #:  [id number]       Height:       62.5 in Accession #:    [id number]      Weight:       205.0 lb Date of Birth:  [birth year]        BSA:          1.943 m Patient Age:    [age]          BP:            141/53 mmHg Patient Gender: F                 HR:           79 bpm. Exam Location:  Inpatient Procedure: 2D Echo, Cardiac Doppler and Color Doppler Indications:    Dyspnea  History:        Patient has prior history of Echocardiogram examinations, most                 recent 12/13/2018. CAD and Previous Myocardial Infarction; Risk                 Factors:Hypertension, Dyslipidemia and Diabetes. PVD.  Sonographer:    ArClayton LefortDCS (AE) Referring Phys: [email or web address] Comments: Suboptimal parasternal window. IMPRESSIONS  1. Left ventricular ejection fraction, by estimation, is 60 to 65%. The left ventricle has normal function. The left ventricle has no regional wall motion abnormalities. There is moderate concentric left ventricular hypertrophy. Left ventricular diastolic parameters are consistent with Grade I diastolic dysfunction (impaired relaxation). Elevated left atrial pressure.  2. Right ventricular systolic function is normal. The right ventricular size is normal. There is normal pulmonary artery systolic pressure.  3. Left atrial size was mildly dilated.  4. The mitral valve is normal in structure. Mild mitral valve regurgitation. Mild mitral stenosis.  5. The aortic valve is normal in structure. Aortic valve regurgitation is not visualized. No aortic stenosis is present.  6. The inferior vena cava is normal in size with <50% respiratory variability, suggesting right atrial pressure of 8 mmHg. FINDINGS  Left Ventricle: Left ventricular ejection fraction, by estimation, is 60 to 65%. The left ventricle has normal function. The left ventricle has no regional wall motion abnormalities. The left ventricular internal cavity size was normal in size. There is  moderate concentric left ventricular hypertrophy. Left ventricular diastolic parameters are consistent with Grade I diastolic dysfunction (impaired relaxation). Elevated left atrial pressure. Right Ventricle: The right ventricular  size is normal. No increase in right ventricular wall thickness. Right ventricular systolic function is normal. There is normal pulmonary artery systolic pressure. Left Atrium: Left atrial size was mildly dilated. Right Atrium: Right atrial size was normal in size. Pericardium: There is no evidence of  pericardial effusion. Mitral Valve: The mitral valve is normal in structure. There is moderate thickening of the mitral valve leaflet(s). There is moderate calcification of the mitral valve leaflet(s). Mild mitral valve regurgitation. Mild mitral valve stenosis. MV peak gradient, 10.5 mmHg. The mean mitral valve gradient is 5.0 mmHg with average heart rate of 80 bpm. Tricuspid Valve: The tricuspid valve is normal in structure. Tricuspid valve regurgitation is trivial. No evidence of tricuspid stenosis. Aortic Valve: The aortic valve is normal in structure. Aortic valve regurgitation is not visualized. No aortic stenosis is present. Aortic valve mean gradient measures 6.0 mmHg. Aortic valve peak gradient measures 10.5 mmHg. Aortic valve area, by VTI measures 2.44 cm. Pulmonic Valve: The pulmonic valve was normal in structure. Pulmonic valve regurgitation is not visualized. No evidence of pulmonic stenosis. Aorta: The aortic root is normal in size and structure. Venous: The inferior vena cava is normal in size with less than 50% respiratory variability, suggesting right atrial pressure of 8 mmHg. IAS/Shunts: No atrial level shunt detected by color flow Doppler.  LEFT VENTRICLE PLAX 2D LVIDd:         4.30 cm  Diastology LVIDs:         3.00 cm  LV e' medial:    4.57 cm/s LV PW:         1.40 cm  LV E/e' medial:  27.6 LV IVS:        1.30 cm  LV e' lateral:   7.40 cm/s LVOT diam:     2.00 cm  LV E/e' lateral: 17.0 LV SV:         95 LV SV Index:   49 LVOT Area:     3.14 cm  RIGHT VENTRICLE             IVC RV Basal diam:  2.50 cm     IVC diam: 1.60 cm RV S prime:     14.80 cm/s TAPSE (M-mode): 2.2 cm LEFT ATRIUM              Index       RIGHT ATRIUM           Index LA diam:        2.80 cm 1.44 cm/m  RA Area:     12.60 cm LA Vol (A2C):   72.2 ml 37.15 ml/m RA Volume:   25.40 ml  13.07 ml/m LA Vol (A4C):   62.5 ml 32.16 ml/m LA Biplane Vol: 67.3 ml 34.63 ml/m  AORTIC VALVE AV Area (Vmax):    2.37 cm AV Area (Vmean):   2.26 cm AV Area (VTI):     2.44 cm AV Vmax:           162.00 cm/s AV Vmean:          122.000 cm/s AV VTI:            0.388 m AV Peak Grad:      10.5 mmHg AV Mean Grad:      6.0 mmHg LVOT Vmax:         122.00 cm/s LVOT Vmean:        87.700 cm/s LVOT VTI:          0.301 m LVOT/AV VTI ratio: 0.78  AORTA Ao Root diam: 2.90 cm Ao Asc diam:  3.30 cm MITRAL VALVE MV Area (PHT): 3.21 cm     SHUNTS MV Area VTI:   2.07 cm     Systemic VTI:  0.30 m MV Peak grad:  10.5 mmHg    Systemic Diam: 2.00 cm MV Mean grad:  5.0 mmHg MV Vmax:       1.62 m/s MV Vmean:      102.0 cm/s MV Decel Time: 236 msec MV E velocity: 126.00 cm/s MV A velocity: 157.00 cm/s MV E/A ratio:  0.80 Ena Dawley MD Electronically signed by Ena Dawley MD Signature Date/Time: 05/10/2020/12:30:13 PM    Final    Disposition   Pt is being discharged home today in good condition.  Follow-up Plans & Appointments     Follow-up Information    Wellington Hampshire, MD. Go on 06/05/2020.   Specialty: Cardiology Why: @10 :20am for hospital follow up  Contact information: [address] [place] [address] [phone]              Discharge Instructions    Diet - low sodium heart healthy   Complete by: As directed    Discharge instructions   Complete by: As directed    No driving for 2 days. No lifting over 5 lbs for 1 week. No sexual activity for 1 week. Keep procedure site clean & dry. If you notice increased pain, swelling, bleeding or pus, call/return!  You may shower, but no soaking baths/hot tubs/pools for 1 week.   Increase activity slowly   Complete by: As directed       Discharge Medications   Allergies as  of 05/10/2020      Reactions   Tape Rash      Medication List    STOP taking these medications   cephALEXin 500 MG capsule Commonly known as: KEFLEX     TAKE these medications   acetaminophen 325 MG tablet Commonly known as: TYLENOL Take 2 tablets (650 mg total) by mouth every 6 (six) hours as needed for mild pain (or Fever >/= 101).   albuterol 108 (90 Base) MCG/ACT inhaler Commonly known as: Proventil HFA INHALE ONE TO TWO PUFFS BY  MOUTH EVERY 6 HOURS AS  NEEDED FOR WHEEZING OR FOR  SHORTNESS OF BREATH What changed: how much to take   amLODipine 5 MG tablet Commonly known as: NORVASC Take 1 tablet (5 mg total) by mouth daily.   aspirin EC 81 MG tablet Take 81 mg by mouth daily. In the morning   blood glucose meter kit and supplies Dispense based on patient and insurance preference. Use up to four times daily as directed. (FOR ICD-10 E10.9, E11.9).   Caltrate 600+D Plus Minerals 600-800 MG-UNIT Tabs Take 1 tablet by mouth 3 (three) times daily.   carvedilol 6.25 MG tablet Commonly known as: COREG Take 1 tablet by mouth twice daily   clopidogrel 75 MG tablet Commonly known as: PLAVIX Take 1 tablet (75 mg total) by mouth daily.   Fiasp FlexTouch 100 UNIT/ML FlexTouch Pen Generic drug: insulin aspart INJECT 40-50 UNITS SUBCUTANEOUSLY THREE TIMES DAILY BEFORE MEAL(S), What changed:   how much to take  how to take this  when to take this   fish oil-omega-3 fatty acids 1000 MG capsule Take 1 g by mouth daily.   Glucose Meter Test test strip Generic drug: glucose blood Use 6-10 times a day, Uses ReliOn   halobetasol 0.05 % cream Commonly known as: ULTRAVATE APPLY  CREAM TOPICALLY TWICE DAILY What changed: See the new instructions.   hydroxypropyl methylcellulose / hypromellose 2.5 % ophthalmic solution Commonly known as: ISOPTO TEARS / GONIOVISC Place 1 drop into both eyes 3 (three) times daily as needed (for dry  eyes).   Insulin Syringe 27G X 1/2" 0.5  ML Misc Use with Fiasp insulin TID   lisinopril 20 MG tablet Commonly known as: ZESTRIL Take 1 tablet (20 mg total) by mouth 2 (two) times daily.   montelukast 10 MG tablet Commonly known as: SINGULAIR Take 1 tablet by mouth once daily with breakfast   nitroGLYCERIN 0.4 MG SL tablet Commonly known as: NITROSTAT Place 1 tablet (0.4 mg total) under the tongue every 5 (five) minutes x 3 doses as needed for chest pain (up to 3 doses).   Ozempic (0.25 or 0.5 MG/DOSE) 2 MG/1.5ML Sopn Generic drug: Semaglutide(0.25 or 0.5MG/DOS) Inject 1 mg into the skin once a week.   ReliOn Pen Needles 29G X 12MM Misc Generic drug: Insulin Pen Needle USE 1 NEEDLE TO INJECT INSULIN SUBCUTANEOUSLY 3 TIMES DAILY What changed: See the new instructions.   BD ULTRA-FINE PEN NEEDLES 29G X 12.7MM Misc Generic drug: Insulin Pen Needle USE PENNEEDLES 3 TIMES  DAILY AS DIRECTED What changed: Another medication with the same name was changed. Make sure you understand how and when to take each.   rosuvastatin 40 MG tablet Commonly known as: CRESTOR Take 1 tablet (40 mg total) by mouth daily.   spironolactone 25 MG tablet Commonly known as: ALDACTONE Take 1 tablet (25 mg total) by mouth daily.   vitamin C 1000 MG tablet Take 1,000 mg by mouth 3 (three) times daily.   Vitamin D-3 125 MCG (5000 UT) Tabs Take 5,000 Units by mouth daily.        Outstanding Labs/Studies    None  Duration of Discharge Encounter   Greater than 30 minutes including physician time.  Mahalia Longest [place], PA 05/10/2020, 2:05 PM

## 2020-05-10 NOTE — Plan of Care (Signed)
Problem: Education: Goal: Knowledge of General Education information will improve Description: Including pain rating scale, medication(s)/side effects and non-pharmacologic comfort measures 05/10/2020 1404 by Camillia Herter, RN Outcome: Adequate for Discharge 05/10/2020 0759 by Camillia Herter, RN Outcome: Progressing   Problem: Health Behavior/Discharge Planning: Goal: Ability to manage health-related needs will improve 05/10/2020 1404 by Camillia Herter, RN Outcome: Adequate for Discharge 05/10/2020 0759 by Camillia Herter, RN Outcome: Progressing   Problem: Clinical Measurements: Goal: Ability to maintain clinical measurements within normal limits will improve 05/10/2020 1404 by Camillia Herter, RN Outcome: Adequate for Discharge 05/10/2020 0759 by Camillia Herter, RN Outcome: Progressing Goal: Will remain free from infection 05/10/2020 1404 by Camillia Herter, RN Outcome: Adequate for Discharge 05/10/2020 0759 by Camillia Herter, RN Outcome: Progressing Goal: Diagnostic test results will improve 05/10/2020 1404 by Camillia Herter, RN Outcome: Adequate for Discharge 05/10/2020 0759 by Camillia Herter, RN Outcome: Progressing Goal: Respiratory complications will improve 05/10/2020 1404 by Camillia Herter, RN Outcome: Adequate for Discharge 05/10/2020 0759 by Camillia Herter, RN Outcome: Progressing Goal: Cardiovascular complication will be avoided 05/10/2020 1404 by Camillia Herter, RN Outcome: Adequate for Discharge 05/10/2020 0759 by Camillia Herter, RN Outcome: Progressing   Problem: Activity: Goal: Risk for activity intolerance will decrease 05/10/2020 1404 by Camillia Herter, RN Outcome: Adequate for Discharge 05/10/2020 0759 by Camillia Herter, RN Outcome: Progressing   Problem: Nutrition: Goal: Adequate nutrition will be maintained 05/10/2020 1404 by Camillia Herter, RN Outcome: Adequate for Discharge 05/10/2020 0759 by Camillia Herter, RN Outcome: Progressing   Problem:  Coping: Goal: Level of anxiety will decrease 05/10/2020 1404 by Camillia Herter, RN Outcome: Adequate for Discharge 05/10/2020 0759 by Camillia Herter, RN Outcome: Progressing   Problem: Elimination: Goal: Will not experience complications related to bowel motility 05/10/2020 1404 by Camillia Herter, RN Outcome: Adequate for Discharge 05/10/2020 0759 by Camillia Herter, RN Outcome: Progressing Goal: Will not experience complications related to urinary retention 05/10/2020 1404 by Camillia Herter, RN Outcome: Adequate for Discharge 05/10/2020 0759 by Camillia Herter, RN Outcome: Progressing   Problem: Pain Managment: Goal: General experience of comfort will improve 05/10/2020 1404 by Camillia Herter, RN Outcome: Adequate for Discharge 05/10/2020 0759 by Camillia Herter, RN Outcome: Progressing   Problem: Safety: Goal: Ability to remain free from injury will improve 05/10/2020 1404 by Camillia Herter, RN Outcome: Adequate for Discharge 05/10/2020 0759 by Camillia Herter, RN Outcome: Progressing   Problem: Skin Integrity: Goal: Risk for impaired skin integrity will decrease 05/10/2020 1404 by Camillia Herter, RN Outcome: Adequate for Discharge 05/10/2020 0759 by Camillia Herter, RN Outcome: Progressing   Problem: Education: Goal: Understanding of CV disease, CV risk reduction, and recovery process will improve 05/10/2020 1404 by Camillia Herter, RN Outcome: Adequate for Discharge 05/10/2020 0759 by Camillia Herter, RN Outcome: Progressing Goal: Individualized Educational Video(s) 05/10/2020 1404 by Camillia Herter, RN Outcome: Adequate for Discharge 05/10/2020 0759 by Camillia Herter, RN Outcome: Progressing   Problem: Activity: Goal: Ability to return to baseline activity level will improve 05/10/2020 1404 by Camillia Herter, RN Outcome: Adequate for Discharge 05/10/2020 0759 by Camillia Herter, RN Outcome: Progressing   Problem: Cardiovascular: Goal: Ability to achieve and maintain adequate  cardiovascular perfusion will improve 05/10/2020 1404 by Camillia Herter, RN Outcome: Adequate for Discharge 05/10/2020 0759 by Camillia Herter, RN Outcome: Progressing Goal: Vascular access  site(s) Level 0-1 will be maintained 05/10/2020 1404 by Camillia Herter, RN Outcome: Adequate for Discharge 05/10/2020 0759 by Camillia Herter, RN Outcome: Progressing   Problem: Health Behavior/Discharge Planning: Goal: Ability to safely manage health-related needs after discharge will improve 05/10/2020 1404 by Camillia Herter, RN Outcome: Adequate for Discharge 05/10/2020 0759 by Camillia Herter, RN Outcome: Progressing

## 2020-05-10 NOTE — Progress Notes (Signed)
  Echocardiogram 2D Echocardiogram has been performed.  Leah Ferrell 05/10/2020, 10:36 AM

## 2020-05-10 NOTE — Progress Notes (Incomplete)
  Echocardiogram 2D Echocardiogram has been performed.  Leah Ferrell 05/10/2020, 10:36 AM

## 2020-05-10 NOTE — Progress Notes (Addendum)
The patient has been seen in conjunction with Vin Bhagat, PAC. All aspects of care have been considered and discussed. The patient has been personally interviewed, examined, and all clinical data has been reviewed.   She was getting the echocardiogram at the time of her rounds.  She denies access site pain or swelling.  No current anginal symptoms.  Plan ambulate with cardiac rehab and if stable, discharge home later today.  Creatinine stable at 0.76 and hemoglobin at 12.8.    Progress Note  Patient Name: Leah Ferrell Date of Encounter: 05/10/2020  [facility] Cardiologist: [name], MD   Subjective   Right-sided chest pain yesterday evening requiring sublingual nitroglycerin x 2.  Currently denies chest pain or shortness of breath.  Inpatient Medications    Scheduled Meds: . amLODipine  5 mg Oral Daily  . vitamin C  1,000 mg Oral TID  . aspirin EC  81 mg Oral Daily  . carvedilol  6.25 mg Oral BID  . cephALEXin  500 mg Oral TID  . cholecalciferol  5,000 Units Oral Daily  . clopidogrel  75 mg Oral Daily  . insulin aspart  0-15 Units Subcutaneous TID WC  . lisinopril  20 mg Oral BID  . montelukast  10 mg Oral QHS  . omega-3 acid ethyl esters  1 g Oral Daily  . rosuvastatin  40 mg Oral Daily  . Semaglutide(0.25 or 0.5MG /DOS)  1 mg Subcutaneous Weekly  . sodium chloride flush  3 mL Intravenous Q12H  . spironolactone  25 mg Oral Daily   Continuous Infusions: . sodium chloride     PRN Meds: sodium chloride, acetaminophen, albuterol, hydrALAZINE, hydroxypropyl methylcellulose / hypromellose, labetalol, nitroGLYCERIN, ondansetron (ZOFRAN) IV, sodium chloride flush   Vital Signs    Vitals:   05/09/20 1814 05/09/20 1900 05/10/20 0005 05/10/20 0310  BP: (!) 159/59 (!) 137/53 (!) 129/53 123/67  Pulse: 86  93 83  Resp: 17 20 16 20   Temp:   98.5 F (36.9 C) 98 F (36.7 C)  TempSrc:   Oral Oral  SpO2: 95% 98% 96% 96%  Weight:    93 kg  Height:        No intake or output data in the 24 hours ending 05/10/20 0804 Last 3 Weights 05/10/2020 05/09/2020 05/01/2020  Weight (lbs) 205 lb 202 lb 205 lb  Weight (kg) 92.987 kg 91.627 kg 92.987 kg      Telemetry    Normal sinus rhythm- Personally Reviewed  ECG    Sinus rhythm with nonspecific T/ST changes in lateral leads- Personally Reviewed  Physical Exam   GEN: No acute distress.   Neck: No JVD Cardiac: RRR, no murmurs, rubs, or gallops.  Bilateral groin cath site without hematoma Respiratory: Clear to auscultation bilaterally. GI: Soft, nontender, non-distended  MS: No edema; No deformity. Neuro:  Nonfocal  Psych: Normal affect   Labs    Pending BMP and CBC this morning  Radiology    CARDIAC CATHETERIZATION  Result Date: 05/09/2020  Prox RCA to Mid RCA lesion is 40% stenosed.  Mid RCA to Dist RCA lesion is 40% stenosed.  Ost LAD to Prox LAD lesion is 80% stenosed.  Mid LAD lesion is 30% stenosed.  2nd Diag lesion is 95% stenosed.  Ost Cx to Prox Cx lesion is 95% stenosed.  1.  Heavily calcified coronary arteries with significant three-vessel coronary artery disease.  Patent stent in the LAD particular is in-stent restenosis in the proximal stent.  In addition,  there is severe ostial stenosis in a large second diagonal, severe ostial stenosis in the left circumflex which is heavily calcified and moderate diffuse disease in the right coronary artery. 2.  Left ventricular angiography was not performed.  LVEDP was normal. 3.  Patent bilateral renal artery stents with significant restenosis in the right stent. 4.  Severe bilateral heavily calcified ostial common iliac artery disease extending into the distal aorta. 5.  Right lower extremity: Moderate common femoral artery disease, flush occlusion of the SFA with reconstitution distally via collaterals from the profunda, short occlusion and above-the-knee popliteal artery with collaterals and three-vessel runoff below the knee. 6.  Left  lower extremity: Flush occlusion of the SFA with collaterals from the profunda and two-vessel runoff below the knee. 7.  Successful bilateral kissing stent placement of common iliac arteries extending into the distal aorta. Recommendations: Continue dual antiplatelet therapy for at least 2 weeks. From a cardiac standpoint, recommend evaluation for CABG in few weeks from now once she is no longer in need of clopidogrel.  This can be done as an outpatient.  I am going to obtain an echocardiogram to finish her cardiac evaluation. From PAD standpoint, I am hopeful that she will be able to heal the wound on her right lower extremity after today's intervention.  However, if that does not happen, the patient will need right femoral popliteal bypass after CABG. Given length of procedure, bilateral access and contrast load, will observe the patient overnight with possible discharge home tomorrow.   PERIPHERAL VASCULAR CATHETERIZATION  Result Date: 05/09/2020  Prox RCA to Mid RCA lesion is 40% stenosed.  Mid RCA to Dist RCA lesion is 40% stenosed.  Ost LAD to Prox LAD lesion is 80% stenosed.  Mid LAD lesion is 30% stenosed.  2nd Diag lesion is 95% stenosed.  Ost Cx to Prox Cx lesion is 95% stenosed.  1.  Heavily calcified coronary arteries with significant three-vessel coronary artery disease.  Patent stent in the LAD particular is in-stent restenosis in the proximal stent.  In addition, there is severe ostial stenosis in a large second diagonal, severe ostial stenosis in the left circumflex which is heavily calcified and moderate diffuse disease in the right coronary artery. 2.  Left ventricular angiography was not performed.  LVEDP was normal. 3.  Patent bilateral renal artery stents with significant restenosis in the right stent. 4.  Severe bilateral heavily calcified ostial common iliac artery disease extending into the distal aorta. 5.  Right lower extremity: Moderate common femoral artery disease, flush  occlusion of the SFA with reconstitution distally via collaterals from the profunda, short occlusion and above-the-knee popliteal artery with collaterals and three-vessel runoff below the knee. 6.  Left lower extremity: Flush occlusion of the SFA with collaterals from the profunda and two-vessel runoff below the knee. 7.  Successful bilateral kissing stent placement of common iliac arteries extending into the distal aorta. Recommendations: Continue dual antiplatelet therapy for at least 2 weeks. From a cardiac standpoint, recommend evaluation for CABG in few weeks from now once she is no longer in need of clopidogrel.  This can be done as an outpatient.  I am going to obtain an echocardiogram to finish her cardiac evaluation. From PAD standpoint, I am hopeful that she will be able to heal the wound on her right lower extremity after today's intervention.  However, if that does not happen, the patient will need right femoral popliteal bypass after CABG. Given length of procedure, bilateral access and contrast  load, will observe the patient overnight with possible discharge home tomorrow.    Cardiac Studies   ABDOMINAL AORTOGRAM W/LOWER EXTREMITY  LEFT HEART CATH AND CORONARY ANGIOGRAPHY  PERIPHERAL VASCULAR INTERVENTION    Conclusion    Prox RCA to Mid RCA lesion is 40% stenosed.  Mid RCA to Dist RCA lesion is 40% stenosed.  Ost LAD to Prox LAD lesion is 80% stenosed.  Mid LAD lesion is 30% stenosed.  2nd Diag lesion is 95% stenosed.  Ost Cx to Prox Cx lesion is 95% stenosed.   1.  Heavily calcified coronary arteries with significant three-vessel coronary artery disease.  Patent stent in the LAD particular is in-stent restenosis in the proximal stent.  In addition, there is severe ostial stenosis in a large second diagonal, severe ostial stenosis in the left circumflex which is heavily calcified and moderate diffuse disease in the right coronary artery. 2.  Left ventricular angiography was  not performed.  LVEDP was normal. 3.  Patent bilateral renal artery stents with significant restenosis in the right stent. 4.  Severe bilateral heavily calcified ostial common iliac artery disease extending into the distal aorta. 5.  Right lower extremity: Moderate common femoral artery disease, flush occlusion of the SFA with reconstitution distally via collaterals from the profunda, short occlusion and above-the-knee popliteal artery with collaterals and three-vessel runoff below the knee. 6.  Left lower extremity: Flush occlusion of the SFA with collaterals from the profunda and two-vessel runoff below the knee. 7.  Successful bilateral kissing stent placement of common iliac arteries extending into the distal aorta.  Recommendations: Continue dual antiplatelet therapy for at least 2 weeks. From a cardiac standpoint, recommend evaluation for CABG in few weeks from now once she is no longer in need of clopidogrel.  This can be done as an outpatient.  I am going to obtain an echocardiogram to finish her cardiac evaluation. From PAD standpoint, I am hopeful that she will be able to heal the wound on her right lower extremity after today's intervention.  However, if that does not happen, the patient will need right femoral popliteal bypass after CABG. Given length of procedure, bilateral access and contrast load, will observe the patient overnight with possible discharge home tomorrow.  Diagnostic Dominance: Right    Patient Profile     70 y.o. female coronary artery disease status post drug-eluting stent placement to the LAD X 2, peripheral arterial disease s/p prior intervention, hypertension, hyperlipidemia, morbid obesity, diastolic heart failure and diabetes mellitus presented for outpatient cardiac catheterization (abnormal stress test) and perivalvular vascular intervention.   Assessment & Plan    1.  Peripheral arterial disease -Peripheral vascular angiography is summarized above.   S/p Successful bilateral kissing stent placement of common iliac arteries extending into the distal aorta.  -Plan for dual antiplatelet therapy for at least 2 weeks  2.  Unstable angina with CAD -Cath showed heavily calcified coronary arteries with significant three-vessel coronary artery disease.  Patent stent in the LAD particular is in-stent restenosis in the proximal stent.  In addition, there is severe ostial stenosis in a large second diagonal, severe ostial stenosis in the left circumflex which is heavily calcified and moderate diffuse disease in the right coronary artery. -Plan for outpatient CABG evaluation in few weeks -Right-sided chest pain yesterday which resolved after sublingual nitroglycerin x2 -Aspirin, Plavix, statin and beta-blocker -Pending echocardiogram, CBC and BMP today  3. HTN -Blood pressure soft this morning -May need to adjust antihypertensive   4. HLD -  03/20/2020: Cholesterol, Total 150; HDL 60; LDL Chol Calc (NIH) 66; Triglycerides 137  -Continue Crestor and Lovaza  5.  Diabetes mellitus -Sliding scale insulins while admitted -Intolerance to Metformin  Pending blood work and echocardiogram.  She needs to ambulate prior to discharge.  Needs to follow-up blood pressure.  For questions or updates, please contact [place] Please consult [email or web address] for contact info under        SignedLeanor Kail, PA  05/10/2020, 8:04 AM

## 2020-05-14 ENCOUNTER — Other Ambulatory Visit (HOSPITAL_COMMUNITY): Payer: Medicare Other

## 2020-05-15 ENCOUNTER — Telehealth: Payer: Self-pay | Admitting: *Deleted

## 2020-05-15 ENCOUNTER — Other Ambulatory Visit: Payer: Self-pay | Admitting: *Deleted

## 2020-05-15 DIAGNOSIS — I739 Peripheral vascular disease, unspecified: Secondary | ICD-10-CM

## 2020-05-15 NOTE — Telephone Encounter (Signed)
Advised patient, verbalized understanding  

## 2020-05-15 NOTE — Telephone Encounter (Signed)
Patient called and she had procedure last week  Doing well and wants to know when she can walk on treadmill Will forward to Dr Fletcher [name] for review

## 2020-05-15 NOTE — Telephone Encounter (Signed)
She can start walking on the treadmill now if she is feeling okay but she should not exceed more than 2.5 mph.  She should stop if she develops any chest pain.

## 2020-05-22 ENCOUNTER — Encounter: Payer: Self-pay | Admitting: Family

## 2020-05-22 ENCOUNTER — Other Ambulatory Visit: Payer: Self-pay

## 2020-05-22 ENCOUNTER — Ambulatory Visit (INDEPENDENT_AMBULATORY_CARE_PROVIDER_SITE_OTHER): Payer: Medicare Other | Admitting: Family

## 2020-05-22 VITALS — BP 135/62 | HR 84 | Temp 97.4°F | Ht 62.5 in | Wt 204.0 lb

## 2020-05-22 DIAGNOSIS — E139 Other specified diabetes mellitus without complications: Secondary | ICD-10-CM

## 2020-05-22 DIAGNOSIS — E1151 Type 2 diabetes mellitus with diabetic peripheral angiopathy without gangrene: Secondary | ICD-10-CM

## 2020-05-22 DIAGNOSIS — I70229 Atherosclerosis of native arteries of extremities with rest pain, unspecified extremity: Secondary | ICD-10-CM | POA: Diagnosis not present

## 2020-05-22 DIAGNOSIS — I252 Old myocardial infarction: Secondary | ICD-10-CM

## 2020-05-22 DIAGNOSIS — E1169 Type 2 diabetes mellitus with other specified complication: Secondary | ICD-10-CM

## 2020-05-22 DIAGNOSIS — Z955 Presence of coronary angioplasty implant and graft: Secondary | ICD-10-CM

## 2020-05-22 DIAGNOSIS — I5032 Chronic diastolic (congestive) heart failure: Secondary | ICD-10-CM

## 2020-05-22 DIAGNOSIS — I739 Peripheral vascular disease, unspecified: Secondary | ICD-10-CM

## 2020-05-22 DIAGNOSIS — I152 Hypertension secondary to endocrine disorders: Secondary | ICD-10-CM | POA: Diagnosis not present

## 2020-05-22 DIAGNOSIS — E11621 Type 2 diabetes mellitus with foot ulcer: Secondary | ICD-10-CM | POA: Diagnosis not present

## 2020-05-22 DIAGNOSIS — L97519 Non-pressure chronic ulcer of other part of right foot with unspecified severity: Secondary | ICD-10-CM

## 2020-05-22 DIAGNOSIS — E785 Hyperlipidemia, unspecified: Secondary | ICD-10-CM

## 2020-05-22 DIAGNOSIS — I2510 Atherosclerotic heart disease of native coronary artery without angina pectoris: Secondary | ICD-10-CM | POA: Diagnosis not present

## 2020-05-22 DIAGNOSIS — E1159 Type 2 diabetes mellitus with other circulatory complications: Secondary | ICD-10-CM | POA: Diagnosis not present

## 2020-05-22 LAB — BAYER DCA HB A1C WAIVED: HB A1C (BAYER DCA - WAIVED): 7.9 % — ABNORMAL HIGH (ref <7.0)

## 2020-05-22 MED ORDER — CEPHALEXIN 500 MG PO CAPS: 500.0000 mg | ORAL_CAPSULE | Freq: Two times a day (BID) | ORAL | 0 refills | Status: DC

## 2020-05-22 NOTE — Progress Notes (Signed)
Subjective:    Patient ID: Leah Ferrell, female    DOB: [birth year], 70 y.o.   MRN: [id number]  Chief Complaint  Patient presents with  . Diabetes    Pt presents to the office today for chronic follow up. Pt has CHF, CAD and hx MI and is followed by Cardiologists every 6 months. PT is followed by Vascular for PVD and PAD. PT states these are stable.  [place] renal angiography on 03/16/19. She reports her swelling is improved and her BP is improved.  She had critical lower limb ischemia and had abdominal aortogram with lower extremity. She is scheduled for CABG in next few weeks. She reports since her stent placement her BP has been lowering. Today she held her lisinopril 20 mg and Coreg 6.25 mg today.    Pt complaining of right fifth toe erythemas and mild swelling. States this improved with her keflex, but once she completed the pain returned. Denies any discharge or fever.    Diabetes She presents for her follow-up diabetic visit. She has type 2 diabetes mellitus. Her disease course has been stable. There are no hypoglycemic associated symptoms. Associated symptoms include fatigue and foot paresthesias. Pertinent negatives for diabetes include no blurred vision. Symptoms are stable. She is following a generally unhealthy diet. Her overall blood glucose range is 180-200 mg/dl. An ACE inhibitor/angiotensin II receptor blocker is being taken. Eye exam is current.  Hypertension This is a chronic problem. The problem has been waxing and waning since onset. The problem is uncontrolled. Associated symptoms include malaise/fatigue and peripheral edema. Pertinent negatives include no blurred vision. Risk factors for coronary artery disease include dyslipidemia, diabetes mellitus, obesity and sedentary lifestyle. The current treatment provides mild improvement. Hypertensive end-organ damage includes kidney disease, CAD/MI and heart failure.  Hyperlipidemia This is a chronic problem. The  current episode started more than 1 year ago. The problem is controlled. Recent lipid tests were reviewed and are normal. Exacerbating diseases include obesity. Current antihyperlipidemic treatment includes statins. The current treatment provides moderate improvement of lipids. Risk factors for coronary artery disease include hypertension, a sedentary lifestyle, post-menopausal, obesity, dyslipidemia and diabetes mellitus.      Review of Systems  Constitutional: Positive for fatigue and malaise/fatigue.  Eyes: Negative for blurred vision.  Skin: Positive for wound.  All other systems reviewed and are negative.      Objective:   Physical Exam Vitals reviewed.  Constitutional:      General: She is not in acute distress.    Appearance: She is well-developed.  HENT:     Head: Normocephalic and atraumatic.     Right Ear: Tympanic membrane normal.     Left Ear: Tympanic membrane normal.  Eyes:     Pupils: Pupils are equal, round, and reactive to light.  Neck:     Thyroid: No thyromegaly.  Cardiovascular:     Rate and Rhythm: Normal rate and regular rhythm.     Heart sounds: Normal heart sounds. No murmur heard.   Pulmonary:     Effort: Pulmonary effort is normal. No respiratory distress.     Breath sounds: Normal breath sounds. No wheezing.  Abdominal:     General: Bowel sounds are normal. There is no distension.     Palpations: Abdomen is soft.     Tenderness: There is no abdominal tenderness.  Musculoskeletal:        General: No tenderness. Normal range of motion.     Cervical back: Normal range of motion and  neck supple.  Skin:    General: Skin is warm and dry.     Comments: Blister on right lateral fifth metatarsal, no erythemas or discharge. Tenderness present.   Neurological:     Mental Status: She is alert and oriented to person, place, and time.     Cranial Nerves: No cranial nerve deficit.     Deep Tendon Reflexes: Reflexes are normal and symmetric.  Psychiatric:         Behavior: Behavior normal.        Thought Content: Thought content normal.        Judgment: Judgment normal.          BP 135/62   Pulse 84   Temp (!) 97.4 F (36.3 C) (Temporal)   Ht 5' 2.5" (1.588 m)   Wt 204 lb (92.5 kg)   BMI 36.72 kg/m   Assessment & Plan:  Leah Ferrell comes in today with chief complaint of Diabetes   Diagnosis and orders addressed:  1. Hypertension associated with diabetes ([place]) - CMP14+EGFR - CBC with Differential/Platelet  2. Diabetes mellitus with peripheral vascular disease ([place]) - CMP14+EGFR - CBC with Differential/Platelet - Bayer DCA Hb A1c Waived  3. Coronary artery disease involving native coronary artery of native heart without angina pectoris - CMP14+EGFR - CBC with Differential/Platelet  4. PVD (peripheral vascular disease) ([facility]) - CMP14+EGFR - CBC with Differential/Platelet  5. Chronic diastolic heart failure ([facility]) - CMP14+EGFR - CBC with Differential/Platelet  6. PAD (peripheral artery disease) ([facility]) - CMP14+EGFR - CBC with Differential/Platelet  7. Critical lower limb ischemia ([facility]) - CMP14+EGFR - CBC with Differential/Platelet  8. 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 Hx of non-ST elevation myocardial infarction (NSTEMI) - CMP14+EGFR - CBC with Differential/Platelet  11. Morbid obesity ([place]) - CMP14+EGFR - CBC with Differential/Platelet  12. S/P primary angioplasty with coronary stent - CMP14+EGFR - CBC with Differential/Platelet   Labs pending Health Maintenance reviewed Diet and exercise encouraged  Follow up plan: 3 months and keep specialists follow up   Evelina Dun, FNP

## 2020-05-22 NOTE — Patient Instructions (Signed)
Coronary Artery Bypass Grafting, Care After This sheet gives you information about how to care for yourself after your procedure. Your doctor may also give you more specific instructions. If you have problems or questions, call your doctor. What can I expect after the procedure? After the procedure, it is common to:  Feel sick to your stomach (nauseous).  Not want to eat as much as normal (lack of appetite).  Have trouble pooping (constipation).  Have weakness and tiredness (fatigue).  Feel sad (depressed) or grouchy (irritable).  Have pain or discomfort around the cuts from surgery (incisions). Follow these instructions at home: Medicines  Take over-the-counter and prescription medicines only as told by your doctor. Do not stop taking medicines or start any new medicines unless your doctor says it is okay.  If you were prescribed an antibiotic medicine, take it as told by your doctor. Do not stop taking the antibiotic even if you start to feel better. Incision care  Follow instructions from your doctor about how to take care of your cuts from surgery. Make sure you: ? Wash your hands with soap and water before and after you change your bandage (dressing). If you cannot use soap and water, use hand sanitizer. ? Change your bandage as told by your doctor. ? Leave stitches (sutures), skin glue, or skin tape (adhesive) strips in place. They may need to stay in place for 2 weeks or longer. If tape strips get loose and curl up, you may trim the loose edges. Do not remove tape strips completely unless your doctor says it is okay.  Make sure the surgery cuts are clean, dry, and protected.  Check your cut areas every day for signs of infection. Check for: ? More redness, swelling, or pain. ? More fluid or blood. ? Warmth. ? Pus or a bad smell.  If cuts were made in your legs: ? Avoid crossing your legs. ? Avoid sitting for long periods of time. Change positions every 30  minutes. ? Raise (elevate) your legs when you are sitting.   Bathing  Do not take baths, swim, or use a hot tub until your doctor says it is okay.  Only take sponge baths. Pat the surgery cuts dry. Do not rub the cuts to dry.  Ask your doctor when you can shower. Eating and drinking  Eat foods that are high in fiber, such as beans, nuts, whole grains, and raw fruits and vegetables. Any meats you eat should be lean cut. Avoid canned, processed, and fried foods. This can help prevent trouble pooping. This is also a part of a heart-healthy diet.  Drink enough fluid to keep your pee (urine) pale yellow.  Do not drink alcohol until you are fully recovered. Ask your doctor when it is safe to drink alcohol.   Activity  Rest and limit your activity as told by your doctor. You may be told to: ? Stop any activity right away if you have chest pain, shortness of breath, irregular heartbeats, or dizziness. Get help right away if you have any of these symptoms. ? Move around often for short periods or take short walks as told by your doctor. Slowly increase your activities. ? Avoid lifting, pushing, or pulling anything that is heavier than 10 lb (4.5 kg) for at least 6 weeks or as told by your doctor.  Do physical therapy or a cardiac rehab (cardiac rehabilitation) program as told by your doctor. ? Physical therapy involves doing exercises to maintain movement and build strength  and endurance. ? A cardiac rehab program includes:  Exercise training.  Education.  Counseling.  Do not drive until your doctor says it is okay.  Ask your doctor when you can go back to work.  Ask your doctor when you can be sexually active. General instructions  Do not drive or use heavy machinery while taking prescription pain medicine.  Do not use any products that contain nicotine or tobacco. These include cigarettes, e-cigarettes, and chewing tobacco. If you need help quitting, ask your doctor.  Take 2-3  deep breaths every few hours during the day while you get better. This helps expand your lungs and prevent problems.  If you were given a device called an incentive spirometer, use it several times a day to practice deep breathing. Support your chest with a pillow or your arms when you take deep breaths or cough.  Wear compression stockings as told by your doctor.  Weigh yourself every day. This helps to see if your body is holding (retaining) fluid that may make your heart and lungs work harder.  Keep all follow-up visits as told by your doctor. This is important. Contact a doctor if:  You have more redness, swelling, or pain around any cut.  You have more fluid or blood coming from any cut.  Any cut feels warm to the touch.  You have pus or a bad smell coming from any cut.  You have a fever.  You have swelling in your ankles or legs.  You have pain in your legs.  You gain 2 lb (0.9 kg) or more a day.  You feel sick to your stomach or you throw up (vomit).  You have watery poop (diarrhea). Get help right away if:  You have chest pain that goes to your jaw or arms.  You are short of breath.  You have a fast or irregular heartbeat.  You notice a "clicking" in your breastbone (sternum) when you move.  You have any signs of a stroke. "BE FAST" is an easy way to remember the main warning signs: ? B - Balance. Signs are dizziness, sudden trouble walking, or loss of balance. ? E - Eyes. Signs are trouble seeing or a change in how you see. ? F - Face. Signs are sudden weakness or loss of feeling of the face, or the face or eyelid drooping on one side. ? A - Arms. Signs are weakness or loss of feeling in an arm. This happens suddenly and usually on one side of the body. ? S - Speech. Signs are sudden trouble speaking, slurred speech, or trouble understanding what people say. ? T - Time. Time to call emergency services. Write down what time symptoms started.  You have other  signs of a stroke, such as: ? A sudden, very bad headache with no known cause. ? Feeling sick to your stomach. ? Throwing up. ? Jerky movements you cannot control (seizure). These symptoms may be an emergency. Do not wait to see if the symptoms will go away. Get medical help right away. Call your local emergency services (911 in the [place] Do not drive yourself to the hospital. Summary  After the procedure, it is common to have pain or discomfort in the cuts from surgery (incisions).  Do not take baths, swim, or use a hot tub until your doctor says it is okay.  Slowly increase your activities. You may need physical therapy or cardiac rehab.  Weigh yourself every day. This helps to see if  your body is holding fluid. This information is not intended to replace advice given to you by your health care provider. Make sure you discuss any questions you have with your health care provider. Document Revised: 10/13/2017 Document Reviewed: 10/13/2017 Elsevier Patient Education  2021 [employer].

## 2020-05-23 ENCOUNTER — Other Ambulatory Visit: Payer: Self-pay | Admitting: Family Medicine

## 2020-05-23 DIAGNOSIS — E875 Hyperkalemia: Secondary | ICD-10-CM

## 2020-05-23 LAB — CMP14+EGFR
ALT: 34 IU/L — ABNORMAL HIGH (ref 0–32)
AST: 19 IU/L (ref 0–40)
Albumin/Globulin Ratio: 1.3 (ref 1.2–2.2)
Albumin: 4.5 g/dL (ref 3.8–4.8)
Alkaline Phosphatase: 57 IU/L (ref 44–121)
BUN/Creatinine Ratio: 14 (ref 12–28)
BUN: 13 mg/dL (ref 8–27)
Bilirubin Total: 0.3 mg/dL (ref 0.0–1.2)
CO2: 24 mmol/L (ref 20–29)
Calcium: 10.2 mg/dL (ref 8.7–10.3)
Chloride: 94 mmol/L — ABNORMAL LOW (ref 96–106)
Creatinine, Ser: 0.9 mg/dL (ref 0.57–1.00)
Globulin, Total: 3.5 g/dL (ref 1.5–4.5)
Glucose: 160 mg/dL — ABNORMAL HIGH (ref 65–99)
Potassium: 5.8 mmol/L — ABNORMAL HIGH (ref 3.5–5.2)
Sodium: 134 mmol/L (ref 134–144)
Total Protein: 8 g/dL (ref 6.0–8.5)
eGFR: 69 mL/min/{1.73_m2} (ref >59)

## 2020-05-23 LAB — CBC WITH DIFFERENTIAL/PLATELET
Basophils Absolute: 0.1 10*3/uL (ref 0.0–0.2)
Basos: 0 %
EOS (ABSOLUTE): 0.2 10*3/uL (ref 0.0–0.4)
Eos: 1 %
Hematocrit: 42.3 % (ref 34.0–46.6)
Hemoglobin: 13.9 g/dL (ref 11.1–15.9)
Immature Grans (Abs): 0 10*3/uL (ref 0.0–0.1)
Immature Granulocytes: 0 %
Lymphocytes Absolute: 2.9 10*3/uL (ref 0.7–3.1)
Lymphs: 21 %
MCH: 31.2 pg (ref 26.6–33.0)
MCHC: 32.9 g/dL (ref 31.5–35.7)
MCV: 95 fL (ref 79–97)
Monocytes Absolute: 0.7 10*3/uL (ref 0.1–0.9)
Monocytes: 5 %
Neutrophils Absolute: 9.6 10*3/uL — ABNORMAL HIGH (ref 1.4–7.0)
Neutrophils: 73 %
Platelets: 372 10*3/uL (ref 150–450)
RBC: 4.46 x10E6/uL (ref 3.77–5.28)
RDW: 12.8 % (ref 11.7–15.4)
WBC: 13.4 10*3/uL — ABNORMAL HIGH (ref 3.4–10.8)

## 2020-05-24 ENCOUNTER — Other Ambulatory Visit: Payer: Medicare Other

## 2020-05-24 ENCOUNTER — Other Ambulatory Visit: Payer: Self-pay

## 2020-05-24 DIAGNOSIS — E875 Hyperkalemia: Secondary | ICD-10-CM | POA: Diagnosis not present

## 2020-05-25 LAB — CMP14+EGFR
ALT: 39 IU/L — ABNORMAL HIGH (ref 0–32)
AST: 21 IU/L (ref 0–40)
Albumin/Globulin Ratio: 1.1 — ABNORMAL LOW (ref 1.2–2.2)
Albumin: 3.9 g/dL (ref 3.8–4.8)
Alkaline Phosphatase: 64 IU/L (ref 44–121)
BUN/Creatinine Ratio: 16 (ref 12–28)
BUN: 13 mg/dL (ref 8–27)
Bilirubin Total: 0.2 mg/dL (ref 0.0–1.2)
CO2: 26 mmol/L (ref 20–29)
Calcium: 10.5 mg/dL — ABNORMAL HIGH (ref 8.7–10.3)
Chloride: 93 mmol/L — ABNORMAL LOW (ref 96–106)
Creatinine, Ser: 0.82 mg/dL (ref 0.57–1.00)
Globulin, Total: 3.4 g/dL (ref 1.5–4.5)
Glucose: 180 mg/dL — ABNORMAL HIGH (ref 65–99)
Potassium: 5.2 mmol/L (ref 3.5–5.2)
Sodium: 137 mmol/L (ref 134–144)
Total Protein: 7.3 g/dL (ref 6.0–8.5)
eGFR: 77 mL/min/{1.73_m2} (ref >59)

## 2020-05-29 ENCOUNTER — Other Ambulatory Visit: Payer: Self-pay | Admitting: Family

## 2020-05-31 ENCOUNTER — Other Ambulatory Visit: Payer: Self-pay | Admitting: Family

## 2020-05-31 ENCOUNTER — Telehealth: Payer: Self-pay

## 2020-05-31 MED ORDER — CEPHALEXIN 500 MG PO CAPS: 500.0000 mg | ORAL_CAPSULE | Freq: Three times a day (TID) | ORAL | 0 refills | Status: DC

## 2020-05-31 NOTE — Telephone Encounter (Signed)
Patient had a visit with Christy on 4/5 and Christy sent in Cephalexin 500mg  1 capsule BID #20 R-0.  Patient has been taking this medication previously TID due to her foot infection.  States when she went to pick up rx she was told by pharmacy to take medication TID not BID which is what is in the chart.  Patient states she is supposed to stay this abx until she see's Cardiology on 4/19.  She is currently out of medication.  Covering PCP - please review and advise

## 2020-05-31 NOTE — Telephone Encounter (Signed)
Patient aware.

## 2020-05-31 NOTE — Telephone Encounter (Signed)
I do not know when she supposed to be seeing cardiology but I gave her another week and a half of it. Caryl Pina, MD [facility] 05/31/2020, 3:33 PM

## 2020-05-31 NOTE — Telephone Encounter (Signed)
Pt called in saying she needs her antibiotic called in for her foot.  She said Leah Ferrell knew about it and I told her the antibiotic was refused because she has to be seen. I don't have any appts left today. Please advise

## 2020-06-01 ENCOUNTER — Other Ambulatory Visit: Payer: Self-pay

## 2020-06-01 ENCOUNTER — Ambulatory Visit (HOSPITAL_COMMUNITY)
Admission: RE | Admit: 2020-06-01 | Discharge: 2020-06-01 | Disposition: A | Discharge status: Home / Self Care | Payer: Medicare Other | Source: Ambulatory Visit | Attending: Cardiovascular Disease | Admitting: Cardiovascular Disease

## 2020-06-01 ENCOUNTER — Ambulatory Visit (HOSPITAL_BASED_OUTPATIENT_CLINIC_OR_DEPARTMENT_OTHER)
Admission: RE | Admit: 2020-06-01 | Discharge: 2020-06-01 | Disposition: A | Discharge status: Home / Self Care | Payer: Medicare Other | Source: Ambulatory Visit | Attending: Cardiovascular Disease | Admitting: Cardiovascular Disease

## 2020-06-01 ENCOUNTER — Other Ambulatory Visit (HOSPITAL_COMMUNITY): Payer: Self-pay | Admitting: Cardiovascular Disease

## 2020-06-01 DIAGNOSIS — I739 Peripheral vascular disease, unspecified: Secondary | ICD-10-CM

## 2020-06-01 DIAGNOSIS — Z95828 Presence of other vascular implants and grafts: Secondary | ICD-10-CM | POA: Diagnosis not present

## 2020-06-05 ENCOUNTER — Encounter: Payer: Self-pay | Admitting: Cardiovascular Disease

## 2020-06-05 ENCOUNTER — Other Ambulatory Visit: Payer: Self-pay

## 2020-06-05 ENCOUNTER — Ambulatory Visit: Payer: Medicare Other | Admitting: Cardiovascular Disease

## 2020-06-05 VITALS — BP 143/78 | HR 76 | Ht 62.0 in | Wt 208.6 lb

## 2020-06-05 DIAGNOSIS — E785 Hyperlipidemia, unspecified: Secondary | ICD-10-CM

## 2020-06-05 DIAGNOSIS — I5032 Chronic diastolic (congestive) heart failure: Secondary | ICD-10-CM

## 2020-06-05 DIAGNOSIS — I2510 Atherosclerotic heart disease of native coronary artery without angina pectoris: Secondary | ICD-10-CM

## 2020-06-05 DIAGNOSIS — I739 Peripheral vascular disease, unspecified: Secondary | ICD-10-CM

## 2020-06-05 DIAGNOSIS — I150 Renovascular hypertension: Secondary | ICD-10-CM | POA: Diagnosis not present

## 2020-06-05 NOTE — Progress Notes (Signed)
Cardiology Office Note   Date:  06/05/2020   ID:  Leah Ferrell, DOB 1950-10-18, MRN [id number]  PCP:  Sharion Balloon, FNP  Cardiologist: Dr. Fletcher [name]   No chief complaint on file.     History of Present Illness: Leah Ferrell is a 70 y.o. female who presents for a followup visit regarding peripheral arterial disease and coronary artery disease.  She has known history of coronary artery disease status post drug-eluting stent placement to the LAD X 2 Most recently in 2015 for in-stent restenosis.  She is known to have peripheral arterial disease status post left common iliac artery stent placement followed by left common femoral artery resection and bypass from left external iliac to the left profunda done by Dr. Kellie Simmering in 2015. She is known to have chronically occluded bilateral SFAs being managed medically.  She had worsening left leg claudication in 2018 with elevated velocity at the proximal anastomosis of the graft.   Angiography in March 2018 showed mild to moderate calcified common iliac artery disease with patent stent in the left common iliac artery. There was severe stenosis in the left common femoral artery at the proximal anastomosis of the bypass to the profunda. I performed successful angioplasty and drug-coated balloon angioplasty.  She is status post bilateral renal artery stenting in January 2021 due to refractory hypertension and recurrent heart failure.  She had significant improvement in blood pressure control as well as heart failure symptoms since then.    She was seen recently for worsening right calf claudication as well her a small nonhealing ulceration on the lateral side of the small toe.  She has some discoloration in all of her toes.  Doppler studies showed moderately reduced ABI bilaterally.  Toe pressure on the right side was not detectable.   In addition, she reported worsening anginal chest pain especially while working out on the treadmill.  Lexiscan  Myoview showed evidence of prior anterolateral, lateral and inferolateral infarct with large peri-infarct ischemia.  Overall high risk stress test. Due to all of the above, I proceeded with cardiac catheterization as well as lower extremity angiography.  Cardiac catheterization showed heavily calcified coronary arteries with significant three-vessel coronary artery disease with patent stents in the LAD but significant restenosis in the proximal stent, severe ostial stenosis in a large second diagonal and severe ostial stenosis in the left circumflex and moderately diffuse disease in the right coronary artery. Lower extremity angiography showed patent renal artery stents with significant restenosis on the right side, severe bilateral heavily calcified ostial common iliac artery disease extending into the distal aorta.  The iliac arteries were treated by successful kissing stent placement.  On the right side, there was moderate common femoral artery stenosis as well as chronically occluded SFA with reconstitution distally via collaterals from the profunda, short occlusion of above-the-knee popliteal artery with collaterals and three-vessel runoff below the knee.  On the left side, there was flush occlusion of the SFA with collaterals from the profunda and two-vessel runoff below the knee.  The ulceration on the right small toe is almost completely healed and claudication improved.  She is able to walk more on the treadmill but she is limited by chest pain with it happens with minimal exertion.  Past Medical History:  Diagnosis Date  . CAD (coronary artery disease)    a. 04/2012 NSTEMI: s/p DES to LAD.  Marland Kitchen Chronic diastolic CHF (congestive heart failure) ([place])   . Diabetes mellitus without complication ([place])   .  Dysrhythmia   . Environmental and seasonal allergies   . Hyperlipidemia   . Hypertension   . Leukocytosis   . Morbid obesity ([place])   . NSTEMI (non-ST elevated myocardial infarction) ([place])  04/27/2012  . PONV (postoperative nausea and vomiting)   . PVD (peripheral vascular disease) ([place])    a. 04/2012 ABI: R 0.49, L 0.39. Angiography 06/14: Significant ostial left common iliac artery stenosis extending into the distal aorta, severe left common femoral artery stenosis, bilateral SFA occlusion with heavy calcifications. Functionally, one-vessel runoff bilaterally below the knee  . Shortness of breath     Past Surgical History:  Procedure Laterality Date  . ABDOMINAL AORTAGRAM N/A 07/21/2012   Procedure: ABDOMINAL Maxcine Ham;  Surgeon: Wellington Hampshire, MD;  Location: [facility] CATH [facility];  Service: Cardiovascular;  Laterality: N/A;  . ABDOMINAL AORTOGRAM W/LOWER EXTREMITY N/A 05/09/2020   Procedure: ABDOMINAL AORTOGRAM W/LOWER EXTREMITY;  Surgeon: Wellington Hampshire, MD;  Location: [facility];  Service: Cardiovascular;  Laterality: N/A;  . CARDIAC CATHETERIZATION     stents X 2  . CORONARY ANGIOGRAM  04/27/2012   Procedure: CORONARY ANGIOGRAM;  Surgeon: Thayer Headings, MD;  Location: [facility] CATH LAB;  Service: Cardiovascular;;  . ENDARTERECTOMY FEMORAL Left 11/02/2013   Procedure: RESECTION LEFT COMMON FEMORAL ARTERY AND INSERTION OF INTERPOSITION 41m HEMASHIELD GRAFT FROM LEFT EXTERNAL  ILIAC ARTERY TO LEFT PROFUNDA  FEMORIS ARTERY;  Surgeon: JMal Misty MD;  [facility]  Service: Vascular;  Laterality: Left;  . EYE SURGERY Right 2017  . FRACTURE SURGERY Right Jul 07, 2014   Right Foot-  Pt.fell  at Home  by Heat duct  . ILIAC ARTERY STENT Left 08/31/2013  . LAD stent    . LEFT HEART CATH AND CORONARY ANGIOGRAPHY N/A 05/09/2020   Procedure: LEFT HEART CATH AND CORONARY ANGIOGRAPHY;  Surgeon: AWellington Hampshire MD;  [facility];  Service: Cardiovascular;  Laterality: N/A;  . LEFT HEART CATHETERIZATION WITH CORONARY ANGIOGRAM N/A 04/23/2012   Procedure: LEFT HEART CATHETERIZATION WITH CORONARY ANGIOGRAM;  Surgeon: Peter M JMartinique MD;  [facility];  Service:  Cardiovascular;  Laterality: N/A;  . LOWER EXTREMITY ANGIOGRAM Left 08/31/2013   Procedure: LOWER EXTREMITY ANGIOGRAM;  Surgeon: MWellington Hampshire MD;  [facility];  Service: Cardiovascular;  Laterality: Left;  . PERCUTANEOUS STENT INTERVENTION Left 08/31/2013   Procedure: PERCUTANEOUS STENT INTERVENTION;  Surgeon: MWellington Hampshire MD;  [facility];  Service: Cardiovascular;  Laterality: Left;  Common Iliac artery  . PERIPHERAL VASCULAR CATHETERIZATION N/A 03/19/2016   Procedure: Abdominal Aortogram w/Lower Extremity;  Surgeon: MWellington Hampshire MD;  [facility];  Service: Cardiovascular;  Laterality: N/A;  . PERIPHERAL VASCULAR CATHETERIZATION  03/19/2016   Procedure: Peripheral Vascular Balloon Angioplasty-CFA Left;  Surgeon: MWellington Hampshire MD;  [facility];  Service: Cardiovascular;;  . PERIPHERAL VASCULAR INTERVENTION Bilateral 03/16/2019   Procedure: PERIPHERAL VASCULAR INTERVENTION;  Surgeon: AWellington Hampshire MD;  [facility];  Service: Cardiovascular;  Laterality: Bilateral;  RENAL  . PERIPHERAL VASCULAR INTERVENTION Bilateral 05/09/2020   Procedure: PERIPHERAL VASCULAR INTERVENTION;  Surgeon: AWellington Hampshire MD;  [facility];  Service: Cardiovascular;  Laterality: Bilateral;  Iliac  . RENAL ANGIOGRAPHY  03/16/2019  . RENAL ANGIOGRAPHY Bilateral 03/16/2019   Procedure: RENAL ANGIOGRAPHY;  Surgeon: AWellington Hampshire MD;  [facility];  Service: Cardiovascular;  Laterality: Bilateral;  .  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 acetaminophen (TYLENOL) 325 MG tablet Take 2 tablets (650 mg total) by mouth every 6 (six) hours as needed for mild pain (or Fever >/= 101). 20 tablet 0  . albuterol (PROVENTIL HFA) 108 (90 Base) MCG/ACT inhaler INHALE ONE TO TWO PUFFS BY  MOUTH EVERY 6 HOURS AS  NEEDED FOR WHEEZING OR  FOR  SHORTNESS OF BREATH (Patient taking differently: 1-2 puffs. INHALE ONE TO TWO PUFFS BY  MOUTH EVERY 6 HOURS AS  NEEDED FOR WHEEZING OR FOR  SHORTNESS OF BREATH) 13.4 g 2  . amLODipine (NORVASC) 5 MG tablet Take 1 tablet (5 mg total) by mouth daily. 90 tablet 2  . Ascorbic Acid (VITAMIN C) 1000 MG tablet Take 1,000 mg by mouth 3 (three) times daily.     Marland Kitchen aspirin EC 81 MG tablet Take 81 mg by mouth daily. In the morning    . BD ULTRA-FINE PEN NEEDLES 29G X 12.7MM MISC USE PENNEEDLES 3 TIMES  DAILY AS DIRECTED 270 each 1  . blood glucose meter kit and supplies Dispense based on patient and insurance preference. Use up to four times daily as directed. (FOR ICD-10 E10.9, E11.9). 1 each 0  . Calcium Carbonate-Vit D-Min (CALTRATE 600+D PLUS MINERALS) 600-800 MG-UNIT TABS Take 1 tablet by mouth 3 (three) times daily.     . carvedilol (COREG) 6.25 MG tablet Take 1 tablet by mouth twice daily 60 tablet 3  . cephALEXin (KEFLEX) 500 MG capsule Take 1 capsule (500 mg total) by mouth 3 (three) times daily. 27 capsule 0  . Cholecalciferol (VITAMIN D-3) 125 MCG (5000 UT) TABS Take 5,000 Units by mouth daily.    . clopidogrel (PLAVIX) 75 MG tablet Take 1 tablet (75 mg total) by mouth daily. 90 tablet 0  . fish oil-omega-3 fatty acids 1000 MG capsule Take 1 g by mouth daily.     Marland Kitchen glucose blood (GLUCOSE METER TEST) test strip Use 6-10 times a day, Uses ReliOn 900 each 12  . halobetasol (ULTRAVATE) 0.05 % cream APPLY  CREAM TOPICALLY TWICE DAILY (Patient taking differently: Apply 1 application topically daily as needed (For rash).) 45 g 0  . hydroxypropyl methylcellulose (ISOPTO TEARS) 2.5 % ophthalmic solution Place 1 drop into both eyes 3 (three) times daily as needed (for dry eyes).    . insulin aspart (FIASP FLEXTOUCH) 100 UNIT/ML FlexTouch Pen INJECT 40-50 UNITS SUBCUTANEOUSLY THREE TIMES DAILY BEFORE MEAL(S), (Patient taking differently: Inject 40-50 Units into the skin See admin instructions. INJECT 40-50  UNITS SUBCUTANEOUSLY THREE TIMES DAILY BEFORE MEAL(S),) 9 mL 2  . Insulin Syringe 27G X 1/2" 0.5 ML MISC Use with Fiasp insulin TID 100 each 3  . lisinopril (ZESTRIL) 20 MG tablet Take 1 tablet (20 mg total) by mouth 2 (two) times daily. 60 tablet 5  . montelukast (SINGULAIR) 10 MG tablet Take 1 tablet by mouth once daily with breakfast 90 tablet 2  . nitroGLYCERIN (NITROSTAT) 0.4 MG SL tablet Place 1 tablet (0.4 mg total) under the tongue every 5 (five) minutes x 3 doses as needed for chest pain (up to 3 doses). 25 tablet 12  . RELION PEN NEEDLES 29G X 12MM MISC USE 1 NEEDLE TO INJECT INSULIN SUBCUTANEOUSLY 3 TIMES DAILY (Patient taking differently: 1 Device by Subconjunctival route 3 (three) times daily.) 50 each 5  . rosuvastatin (CRESTOR) 40 MG tablet Take 1 tablet (  40 mg total) by mouth daily. 90 tablet 3  . Semaglutide,0.25 or 0.5MG/DOS, (OZEMPIC, 0.25 OR 0.5 MG/DOSE,) 2 MG/1.5ML SOPN Inject 1 mg into the skin once a week. 6 mL 3  . spironolactone (ALDACTONE) 25 MG tablet Take 1 tablet (25 mg total) by mouth daily. 90 tablet 3   No current facility-administered medications for this visit.    Allergies:   Tape    Social History:  The patient  reports that she quit smoking about 19 years ago. Her smoking use included cigarettes. She started smoking about 56 years ago. She has a 36.00 pack-year smoking history. She has never used smokeless tobacco. She reports that she does not drink alcohol and does not use drugs.   Family History:  The patient's family history includes Alcohol abuse in her brother; Diabetes in her brother, daughter, maternal grandmother, mother, and son; Heart attack in her maternal grandfather; Heart disease in her brother; Hyperlipidemia in her mother and son; Hypertension in her daughter, mother, and son; Other in her sister and sister; Peripheral vascular disease in her mother.    ROS:  Please see the history of present illness.   Otherwise, review of systems are  positive for none.   All other systems are reviewed and negative.    PHYSICAL EXAM: VS:  BP (!) 143/78   Pulse 76   Ht _0  (1.575 m)   Wt 208 lb 9.6 oz (94.6 kg)   SpO2 97%   BMI 38.15 kg/m  , BMI Body mass index is 38.15 kg/m. GEN: Well nourished, well developed, in no acute distress  HEENT: normal  Neck: no JVD, carotid bruits, or masses Cardiac: RRR; no  rubs, or gallops , trace edema . There is 2/6 systolic ejection murmur in the aortic area Respiratory:  clear to auscultation bilaterally, normal work of breathing GI: soft, nontender, nondistended, + BS MS: no deformity or atrophy  Skin: warm and dry, no rash Neuro:  Strength and sensation are intact Psych: euthymic mood, full affect Distal pulses are not palpable.  No groin hematoma  EKG:  EKG is  ordered today.  EKG showed normal sinus rhythm with lateral T wave changes suggestive of ischemia.  Recent Labs: 05/22/2020: Hemoglobin 13.9; Platelets 372 05/24/2020: ALT 39; BUN 13; Creatinine, Ser 0.82; Potassium 5.2; Sodium 137    Lipid Panel    Component Value Date/Time   CHOL 150 03/20/2020 1222   TRIG 137 03/20/2020 1222   TRIG 135 09/16/2012 1322   HDL 60 03/20/2020 1222   HDL 60 09/16/2012 1322   CHOLHDL 2.5 03/20/2020 1222   CHOLHDL 3.2 05/06/2012 1713   VLDL 32 05/06/2012 1713   LDLCALC 66 03/20/2020 1222   LDLCALC 92 09/16/2012 1322      Wt Readings from Last 3 Encounters:  06/05/20 208 lb 9.6 oz (94.6 kg)  05/22/20 204 lb (92.5 kg)  05/10/20 205 lb (93 kg)       No flowsheet data found.    ASSESSMENT AND PLAN:  1. Peripheral arterial disease: Status post recent kissing stent placement to the common iliac arteries due to severe right calf claudication as well as nonhealing ulceration on the right small toe.  The ulceration has almost completely healed.  Post procedure duplex showed patent iliac stents.   The patient has completed almost 1 month of dual antiplatelet therapy and clopidogrel can be  discontinued once CABG is scheduled.  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  Blood pressure is  reasonably controlled.  Recent angiography did show significant restenosis on the right side but no plan for revascularization at this time given that renal artery stent looked reasonable and blood pressure is reasonably controlled.  3. Coronary artery disease involving native coronary arteries with progressive angina: Currently she has class III angina and spite of maximal antianginal therapy.  I reviewed the results of recent cardiac catheterization with her.  Her vessels are not optimal for PCI given heavy calcifications and diffuse disease.  Recommend CABG.  I am referring her to cardiothoracic surgery for evaluation.  Once CABG is scheduled, clopidogrel can be held.    4. Hyperlipidemia: Continue high-dose rosuvastatin with a target LDL of less than 70.  Most recent lipid profile showed an LDL of 66.  5.  Chronic diastolic heart failure: Most likely in the setting of uncontrolled hypertension and severe bilateral renal artery stenosis.  Currently not requiring a loop diuretic since she had her renal artery stents.   Disposition:   FU with me in 3  month  Signed,  [name], MD  06/05/2020 1:14 PM    [facility]

## 2020-06-05 NOTE — Patient Instructions (Signed)
Medication Instructions:  No changes *If you need a refill on your cardiac medications before your next appointment, please call your pharmacy*   Lab Work: None ordered If you have labs (blood work) drawn today and your tests are completely normal, you will receive your results only by: Marland Kitchen MyChart Message (if you have MyChart) OR . A paper copy in the mail If you have any lab test that is abnormal or we need to change your treatment, we will call you to review the results.   Testing/Procedures: None ordered   Follow-Up: At [facility], you and your health needs are our priority.  As part of our continuing mission to provide you with exceptional heart care, we have created designated Provider Care Teams.  These Care Teams include your primary Cardiologist (physician) and Advanced Practice Providers (APPs -  Physician Assistants and Nurse Practitioners) who all work together to provide you with the care you need, when you need it.  We recommend signing up for the patient portal called "MyChart".  Sign up information is provided on this After Visit Summary.  MyChart is used to connect with patients for Virtual Visits (Telemedicine).  Patients are able to view lab/test results, encounter notes, upcoming appointments, etc.  Non-urgent messages can be sent to your provider as well.   To learn more about what you can do with MyChart, go to [email or web address].    Your next appointment:   3 month(s)  The format for your next appointment:   In Person  Provider:   Dr. Fletcher [name]  Other Instructions A referral has been placed to [facility].

## 2020-06-12 ENCOUNTER — Telehealth: Payer: Self-pay

## 2020-06-12 ENCOUNTER — Ambulatory Visit: Payer: Medicare Other | Admitting: Cardiovascular Disease

## 2020-06-12 NOTE — Telephone Encounter (Signed)
Patient states her foot and leg improved by 50%. She can wiggle her toes. Doing much better with blood flow. She still has some redness with a little infection. She wants refill of keflex until she gets in with specialist for her heart surgery because the 90 % blockage in heart. Please advise

## 2020-06-14 ENCOUNTER — Institutional Professional Consult (permissible substitution): Payer: Medicare Other | Admitting: Cardiothoracic Surgery

## 2020-06-14 ENCOUNTER — Encounter: Payer: Self-pay | Admitting: *Deleted

## 2020-06-14 ENCOUNTER — Other Ambulatory Visit: Payer: Self-pay

## 2020-06-14 ENCOUNTER — Other Ambulatory Visit: Payer: Self-pay | Admitting: *Deleted

## 2020-06-14 VITALS — BP 150/62 | HR 80 | Resp 20 | Ht 62.0 in | Wt 204.0 lb

## 2020-06-14 DIAGNOSIS — I2510 Atherosclerotic heart disease of native coronary artery without angina pectoris: Secondary | ICD-10-CM | POA: Diagnosis not present

## 2020-06-14 MED ORDER — CEPHALEXIN 500 MG PO CAPS: 500.0000 mg | ORAL_CAPSULE | Freq: Three times a day (TID) | ORAL | 0 refills | Status: DC

## 2020-06-14 NOTE — Telephone Encounter (Signed)
Keflex Prescription sent to pharmacy, keep clean and dry. Report fever, increase discharge.

## 2020-06-14 NOTE — Progress Notes (Signed)
[address]       [place] [address]             [phone]     CARDIOTHORACIC SURGERY office visitation  Referring Provider is Wellington Hampshire, MD Primary Cardiologist is [name], MD PCP is Sharion Balloon, FNP  Chief Complaint  Patient presents with  . Coronary Artery Disease    Surgical consult, Cardiac Cath 05/09/20, ECHO 05/10/20    HPI:  70 year old lady presents for outpatient consideration of CABG.  She has extensive cardiovascular and peripheral vascular calcific disease.  She has undergone multiple stenting procedures most recently bilateral iliac stenting.  She is also undergone previous PCI but upon further evaluation was noted to have exertional chest pain and shortness of breath.  Repeat catheterization and March 2022 demonstrates reasonably patent RCA stent but ends did not restenosis in the LAD and proximal disease of the circumflex vessel as well.  She has preserved LV function.  She denies atrial fibrillation or strokes.  She has no symptoms of heart failure.  Past Medical History:  Diagnosis Date  . CAD (coronary artery disease)    a. 04/2012 NSTEMI: s/p DES to LAD.  Marland Kitchen Chronic diastolic CHF (congestive heart failure) ([place])   . Diabetes mellitus without complication ([place])   . Dysrhythmia   . Environmental and seasonal allergies   . Hyperlipidemia   . Hypertension   . Leukocytosis   . Morbid obesity ([place])   . NSTEMI (non-ST elevated myocardial infarction) ([place]) 04/27/2012  . PONV (postoperative nausea and vomiting)   . PVD (peripheral vascular disease) ([place])    a. 04/2012 ABI: R 0.49, L 0.39. Angiography 06/14: Significant ostial left common iliac artery stenosis extending into the distal aorta, severe left common femoral artery stenosis, bilateral SFA occlusion with heavy calcifications. Functionally, one-vessel runoff bilaterally below the knee  . Shortness of breath     Past Surgical History:  Procedure Laterality Date   . ABDOMINAL AORTAGRAM N/A 07/21/2012   Procedure: ABDOMINAL Maxcine Ham;  Surgeon: Wellington Hampshire, MD;  Location: [facility] CATH LAB;  Service: Cardiovascular;  Laterality: N/A;  . ABDOMINAL AORTOGRAM W/LOWER EXTREMITY N/A 05/09/2020   Procedure: ABDOMINAL AORTOGRAM W/LOWER EXTREMITY;  Surgeon: Wellington Hampshire, MD;  Location: [facility] LAB;  Service: Cardiovascular;  Laterality: N/A;  . CARDIAC CATHETERIZATION     stents X 2  . CORONARY ANGIOGRAM  04/27/2012   Procedure: CORONARY ANGIOGRAM;  Surgeon: Thayer Headings, MD;  Location[facility] CATH LAB;  Service: Cardiovascular;;  . ENDARTERECTOMY FEMORAL Left 11/02/2013   Procedure: RESECTION LEFT COMMON FEMORAL ARTERY AND INSERTION OF INTERPOSITION 23m HEMASHIELD GRAFT FROM LEFT EXTERNAL  ILIAC ARTERY TO LEFT PROFUNDA  FEMORIS ARTERY;  Surgeon: JMal Misty MD;  [facility]  Service: Vascular;  Laterality: Left;  . EYE SURGERY Right 2017  . FRACTURE SURGERY Right Jul 07, 2014   Right Foot-  [place]  by Heat duct  . ILIAC ARTERY STENT Left 08/31/2013  . LAD stent    . LEFT HEART CATH AND CORONARY ANGIOGRAPHY N/A 05/09/2020   Procedure: LEFT HEART CATH AND CORONARY ANGIOGRAPHY;  Surgeon: AWellington Hampshire MD;  [facility];  Service: Cardiovascular;  Laterality: N/A;  . LEFT HEART CATHETERIZATION WITH CORONARY ANGIOGRAM N/A 04/23/2012   Procedure: LEFT HEART CATHETERIZATION WITH CORONARY ANGIOGRAM;  Surgeon: Peter M JMartinique MD;  [facility];  Service: Cardiovascular;  Laterality: N/A;  .  LOWER EXTREMITY ANGIOGRAM Left 08/31/2013   Procedure: LOWER EXTREMITY ANGIOGRAM;  Surgeon: Wellington Hampshire, MD;  Location: [facility] CATH LAB;  Service: Cardiovascular;  Laterality: Left;  . PERCUTANEOUS STENT INTERVENTION Left 08/31/2013   Procedure: PERCUTANEOUS STENT INTERVENTION;  Surgeon: Wellington Hampshire, MD;  Location: [facility] CATH LAB;  Service: Cardiovascular;  Laterality: Left;  Common Iliac artery  . PERIPHERAL VASCULAR  CATHETERIZATION N/A 03/19/2016   Procedure: Abdominal Aortogram w/Lower Extremity;  Surgeon: Wellington Hampshire, MD;  Location: [facility] CV LAB;  Service: Cardiovascular;  Laterality: N/A;  . PERIPHERAL VASCULAR CATHETERIZATION  03/19/2016   Procedure: Peripheral Vascular Balloon Angioplasty-CFA Left;  Surgeon: Wellington Hampshire, MD;  Location: [facility] CV LAB;  Service: Cardiovascular;;  . PERIPHERAL VASCULAR INTERVENTION Bilateral 03/16/2019   Procedure: PERIPHERAL VASCULAR INTERVENTION;  Surgeon: Wellington Hampshire, MD;  Location: [facility] CV LAB;  Service: Cardiovascular;  Laterality: Bilateral;  RENAL  . PERIPHERAL VASCULAR INTERVENTION Bilateral 05/09/2020   Procedure: PERIPHERAL VASCULAR INTERVENTION;  Surgeon: Wellington Hampshire, MD;  Location: [facility] CV LAB;  Service: Cardiovascular;  Laterality: Bilateral;  Iliac  . RENAL ANGIOGRAPHY  03/16/2019  . RENAL ANGIOGRAPHY Bilateral 03/16/2019   Procedure: RENAL ANGIOGRAPHY;  Surgeon: Wellington Hampshire, MD;  Location: [facility] CV LAB;  Service: Cardiovascular;  Laterality: Bilateral;  . TUMOR REMOVAL     tumor removed from [place]    Family History  Problem Relation Age of Onset  . Diabetes Mother   . Hyperlipidemia Mother   . Hypertension Mother   . Peripheral vascular disease Mother   . Diabetes Maternal Grandmother   . Heart attack Maternal Grandfather   . Heart disease Brother   . Other Sister        drowned  . Diabetes Brother   . Alcohol abuse Brother   . Other Sister        drowned  . Diabetes Daughter        borderline  . Hypertension Daughter   . Diabetes Son   . Hypertension Son   . Hyperlipidemia Son     Social History   Socioeconomic History  . Marital status: Widowed    Spouse name: Not on file  . Number of children: 4  . Years of education: completed school in [place]   . Highest education level: Not on file  Occupational History  . Occupation: retired  Tobacco Use  . Smoking status: Former  Smoker    Packs/day: 1.00    Years: 36.00    Pack years: 36.00    Types: Cigarettes    Start date: 04/21/1964    Quit date: 02/17/2001    Years since quitting: 19.3  . Smokeless tobacco: Never Used  Vaping Use  . Vaping Use: Never used  Substance and Sexual Activity  . Alcohol use: No    Alcohol/week: 0.0 standard drinks  . Drug use: No  . Sexual activity: Not Currently    Birth control/protection: Post-menopausal  Other Topics Concern  . Not on file  Social History Narrative  . Not on file   Social Determinants of Health   Financial Resource Strain: Not on file  Food Insecurity: Not on file  Transportation Needs: Not on file  Physical Activity: Not on file  Stress: Not on file  Social Connections: Not on file  Intimate Partner Violence: Not on file    Current Outpatient Medications  Medication Sig Dispense Refill  . acetaminophen (TYLENOL) 325 MG tablet Take 2 tablets (650 mg  total) by mouth every 6 (six) hours as needed for mild pain (or Fever >/= 101). 20 tablet 0  . albuterol (PROVENTIL HFA) 108 (90 Base) MCG/ACT inhaler INHALE ONE TO TWO PUFFS BY  MOUTH EVERY 6 HOURS AS  NEEDED FOR WHEEZING OR FOR  SHORTNESS OF BREATH (Patient taking differently: 1-2 puffs. INHALE ONE TO TWO PUFFS BY  MOUTH EVERY 6 HOURS AS  NEEDED FOR WHEEZING OR FOR  SHORTNESS OF BREATH) 13.4 g 2  . amLODipine (NORVASC) 5 MG tablet Take 1 tablet (5 mg total) by mouth daily. 90 tablet 2  . Ascorbic Acid (VITAMIN C) 1000 MG tablet Take 1,000 mg by mouth 3 (three) times daily.     Marland Kitchen aspirin EC 81 MG tablet Take 81 mg by mouth daily. In the morning    . BD ULTRA-FINE PEN NEEDLES 29G X 12.7MM MISC USE PENNEEDLES 3 TIMES  DAILY AS DIRECTED 270 each 1  . blood glucose meter kit and supplies Dispense based on patient and insurance preference. Use up to four times daily as directed. (FOR ICD-10 E10.9, E11.9). 1 each 0  . Calcium Carbonate-Vit D-Min (CALTRATE 600+D PLUS MINERALS) 600-800 MG-UNIT TABS Take 1 tablet  by mouth 3 (three) times daily.     . carvedilol (COREG) 6.25 MG tablet Take 1 tablet by mouth twice daily 60 tablet 3  . cephALEXin (KEFLEX) 500 MG capsule Take 1 capsule (500 mg total) by mouth 3 (three) times daily. 27 capsule 0  . Cholecalciferol (VITAMIN D-3) 125 MCG (5000 UT) TABS Take 5,000 Units by mouth daily.    . clopidogrel (PLAVIX) 75 MG tablet Take 1 tablet (75 mg total) by mouth daily. 90 tablet 0  . fish oil-omega-3 fatty acids 1000 MG capsule Take 1 g by mouth daily.     Marland Kitchen glucose blood (GLUCOSE METER TEST) test strip Use 6-10 times a day, Uses ReliOn 900 each 12  . halobetasol (ULTRAVATE) 0.05 % cream APPLY  CREAM TOPICALLY TWICE DAILY (Patient taking differently: Apply 1 application topically daily as needed (For rash).) 45 g 0  . hydroxypropyl methylcellulose (ISOPTO TEARS) 2.5 % ophthalmic solution Place 1 drop into both eyes 3 (three) times daily as needed (for dry eyes).    . insulin aspart (FIASP FLEXTOUCH) 100 UNIT/ML FlexTouch Pen INJECT 40-50 UNITS SUBCUTANEOUSLY THREE TIMES DAILY BEFORE MEAL(S), (Patient taking differently: Inject 40-50 Units into the skin See admin instructions. INJECT 40-50 UNITS SUBCUTANEOUSLY THREE TIMES DAILY BEFORE MEAL(S),) 9 mL 2  . Insulin Syringe 27G X 1/2" 0.5 ML MISC Use with Fiasp insulin TID 100 each 3  . lisinopril (ZESTRIL) 20 MG tablet Take 1 tablet (20 mg total) by mouth 2 (two) times daily. 60 tablet 5  . montelukast (SINGULAIR) 10 MG tablet Take 1 tablet by mouth once daily with breakfast 90 tablet 2  . nitroGLYCERIN (NITROSTAT) 0.4 MG SL tablet Place 1 tablet (0.4 mg total) under the tongue every 5 (five) minutes x 3 doses as needed for chest pain (up to 3 doses). 25 tablet 12  . RELION PEN NEEDLES 29G X 12MM MISC USE 1 NEEDLE TO INJECT INSULIN SUBCUTANEOUSLY 3 TIMES DAILY (Patient taking differently: 1 Device by Subconjunctival route 3 (three) times daily.) 50 each 5  . rosuvastatin (CRESTOR) 40 MG tablet Take 1 tablet (40 mg total) by  mouth daily. 90 tablet 3  . Semaglutide,0.25 or 0.5MG/DOS, (OZEMPIC, 0.25 OR 0.5 MG/DOSE,) 2 MG/1.5ML SOPN Inject 1 mg into the skin once a week. 6 mL 3  .  spironolactone (ALDACTONE) 25 MG tablet Take 1 tablet (25 mg total) by mouth daily. 90 tablet 3   No current facility-administered medications for this visit.    Allergies  Allergen Reactions  . Tape Rash      Review of Systems:   General:  No weight change.  No constitutional symptoms  Cardiac:  As per HPI; no atrial fibrillation  Respiratory:  Denies cough or sleep apnea  GI:   No abdominal pain or bleeding  GU:   No kidney stones or bladder disease  Vascular:  As per HPI  Neuro:   No history of strokes TIAs or seizures  Musculoskeletal: No arthritis  Skin:   Negative  Psych:   Negative  Eyes:   Negative  ENT:   Does wear dentures  Hematologic:  Anticoagulated with antiplatelet medications  Endocrine:  History of diabetes     Physical Exam:   BP (!) 150/62   Pulse 80   Resp 20   Ht _0  (1.575 m)   Wt 92.5 kg   SpO2 96% Comment: RA  BMI 37.31 kg/m   General:   well-appearing  HEENT:  Unremarkable   Neck:   no JVD, no bruits, no adenopathy   Chest:   clear to auscultation, symmetrical breath sounds, no wheezes, no rhonchi   CV:   RRR, no detectable murmur   Abdomen:  soft, non-tender, no masses  Extremities:  warm, well-perfused, pulses diminished in the feet, no LE edema  Rectal/GU  Deferred  Neuro:   Grossly non-focal and symmetrical throughout  Skin:   Clean and dry, no rashes, no breakdown   Diagnostic Tests:  I have personally reviewed her available imaging studies including left heart catheterization from 05/09/2020 which demonstrates severe proximal circumflex and LAD disease.  She has preserved LV function   Impression:  70 year old lady with extensive cardiovascular atherosclerotic disease.  She has previously undergone multiple stenting procedures including coronary stenting.  She now has  recurrent disease in the left coronary system.  She appears to be a good candidate for surgical coronary revascularization.    Plan:  CABG on 06/25/2020 Stop Plavix on 06/20/2020; hold lisinopril after 06/22/2020 Routine preoperative work-up   I spent in excess of 30 minutes during the conduct of this office consultation and >50% of this time involved direct face-to-face encounter with the patient for counseling and/or coordination of their care.          Level 3 Office Consult = 40 minutes         Level 4 Office Consult = 60 minutes         Level 5 Office Consult = 80 minutes  B.  Murvin Natal, MD 06/14/2020 10:29 AM

## 2020-06-19 ENCOUNTER — Ambulatory Visit: Payer: Medicare Other | Admitting: Family

## 2020-06-20 NOTE — Progress Notes (Signed)
Surgical Instructions    Your procedure is scheduled on May 9  Report to [facility] Main Entrance "A" at 0530 A.M., then check in with the Admitting office.  Call this number if you have problems the morning of surgery:  [phone]   If you have any questions prior to your surgery date call [phone]: Open Monday-Friday 8am-4pm    Remember:  Do not eat or drink after midnight the night before your surgery     Take these medicines the morning of surgery with A SIP OF WATER  albuterol (PROVENTIL HFA) Please bring all inhalers with you the day of surgery.  amLODipine (NORVASC) carvedilol (COREG) cephALEXin (KEFLEX Eye drops montelukast (SINGULAIR rosuvastatin (CRESTOR)  Follow your surgeon's instructions on when to stop Aspirin and clopidogrel (PLAVIX)  .  If no instructions were given by your surgeon then you will need to call the office to get those instructions.    As of today, STOP taking any Aspirin (unless otherwise instructed by your surgeon) Aleve, Naproxen, Ibuprofen, Motrin, Advil, Goody's, BC's, all herbal medications, fish oil, and all vitamins.           WHAT DO I DO ABOUT MY DIABETES MEDICATION?   Take Semaglutide,0.25 or 0.5MG /DOS, (OZEMPIC, 0.25 OR 0.5 MG/DOSE,) when you are scheduled to do so,   . If your CBG is greater than 220 mg/dL, you may take  of your sliding scale (correction) dose of insulin.   HOW TO MANAGE YOUR DIABETES BEFORE AND AFTER SURGERY  Why is it important to control my blood sugar before and after surgery? . Improving blood sugar levels before and after surgery helps healing and can limit problems. . A way of improving blood sugar control is eating a healthy diet by: o  Eating less sugar and carbohydrates o  Increasing activity/exercise o  Talking with your doctor about reaching your blood sugar goals . High blood sugars (greater than 180 mg/dL) can raise your risk of infections and slow your recovery, so you will need to focus on  controlling your diabetes during the weeks before surgery. . Make sure that the doctor who takes care of your diabetes knows about your planned surgery including the date and location.  How do I manage my blood sugar before surgery? . Check your blood sugar at least 4 times a day, starting 2 days before surgery, to make sure that the level is not too high or low. . Check your blood sugar the morning of your surgery when you wake up and every 2 hours until you get to the Short Stay unit. o If your blood sugar is less than 70 mg/dL, you will need to treat for low blood sugar: - Do not take insulin. - Treat a low blood sugar (less than 70 mg/dL) with  cup of clear juice (cranberry or apple), 4 glucose tablets, OR glucose gel. - Recheck blood sugar in 15 minutes after treatment (to make sure it is greater than 70 mg/dL). If your blood sugar is not greater than 70 mg/dL on recheck, call [phone] for further instructions. . Report your blood sugar to the short stay nurse when you get to Short Stay.  . If you are admitted to the hospital after surgery: o Your blood sugar will be checked by the staff and you will probably be given insulin after surgery (instead of oral diabetes medicines) to make sure you have good blood sugar levels. o The goal for blood sugar control after surgery is 80-180 mg/dL.  Do not wear jewelry, make up, or nail polish            Do not wear lotions, powders, perfumes, or deodorant.            Do not shave 48 hours prior to surgery.             Do not bring valuables to the hospital.            [facility] is not responsible for any belongings or valuables.  Do NOT Smoke (Tobacco/Vaping) or drink Alcohol 24 hours prior to your procedure If you use a CPAP at night, you may bring all equipment for your overnight stay.   Contacts, glasses, dentures or bridgework may not be worn into surgery, please bring cases for these belongings   For patients admitted to  the hospital, discharge time will be determined by your treatment team.   Patients discharged the day of surgery will not be allowed to drive home, and someone needs to stay with them for 24 hours.    Special instructions:   [place] [facility]- Preparing For Surgery  Before surgery, you can play an important role. Because skin is not sterile, your skin needs to be as free of germs as possible. You can reduce the number of germs on your skin by washing with CHG (chlorahexidine gluconate) Soap before surgery.  CHG is an antiseptic cleaner which kills germs and bonds with the skin to continue killing germs even after washing.    Oral Hygiene is also important to reduce your risk of infection.  Remember - BRUSH YOUR TEETH THE MORNING OF SURGERY WITH YOUR REGULAR TOOTHPASTE  Please do not use if you have an allergy to CHG or antibacterial soaps. If your skin becomes reddened/irritated stop using the CHG.  Do not shave (including legs and underarms) for at least 48 hours prior to first CHG shower. It is OK to shave your face.  Please follow these instructions carefully.   1. Shower the NIGHT BEFORE SURGERY and the MORNING OF SURGERY  2. If you chose to wash your hair, wash your hair first as usual with your normal shampoo.  3. After you shampoo, rinse your hair and body thoroughly to remove the shampoo.  4. Wash Face and genitals (private parts) with your normal soap.   5.  Shower the NIGHT BEFORE SURGERY and the MORNING OF SURGERY with CHG Soap.   6. Use CHG Soap as you would any other liquid soap. You can apply CHG directly to the skin and wash gently with a scrungie or a clean washcloth.   7. Apply the CHG Soap to your body ONLY FROM THE NECK DOWN.  Do not use on open wounds or open sores. Avoid contact with your eyes, ears, mouth and genitals (private parts). Wash Face and genitals (private parts)  with your normal soap.   8. Wash thoroughly, paying special attention to the area where your  surgery will be performed.  9. Thoroughly rinse your body with warm water from the neck down.  10. DO NOT shower/wash with your normal soap after using and rinsing off the CHG Soap.  11. Pat yourself dry with a CLEAN TOWEL.  12. Wear CLEAN PAJAMAS to bed the night before surgery  13. Place CLEAN SHEETS on your bed the night before your surgery  14. DO NOT SLEEP WITH PETS.   Day of Surgery: Take a shower with CHG soap. Wear Clean/Comfortable clothing the morning of surgery Do  not apply any deodorants/lotions.   Remember to brush your teeth WITH YOUR REGULAR TOOTHPASTE.   Please read over the following fact sheets that you were given.

## 2020-06-21 ENCOUNTER — Other Ambulatory Visit (HOSPITAL_COMMUNITY)
Admission: RE | Admit: 2020-06-21 | Discharge: 2020-06-21 | Disposition: A | Discharge status: Home / Self Care | Payer: Medicare Other | Source: Ambulatory Visit | Attending: Cardiothoracic Surgery | Admitting: Cardiothoracic Surgery

## 2020-06-21 ENCOUNTER — Other Ambulatory Visit: Payer: Self-pay

## 2020-06-21 ENCOUNTER — Ambulatory Visit (HOSPITAL_COMMUNITY)
Admission: RE | Admit: 2020-06-21 | Discharge: 2020-06-21 | Disposition: A | Discharge status: Home / Self Care | Payer: Medicare Other | Source: Ambulatory Visit | Attending: Cardiothoracic Surgery | Admitting: Cardiothoracic Surgery

## 2020-06-21 ENCOUNTER — Encounter (HOSPITAL_COMMUNITY)
Admission: RE | Admit: 2020-06-21 | Discharge: 2020-06-21 | Disposition: A | Discharge status: Home / Self Care | Payer: Medicare Other | Source: Ambulatory Visit | Attending: Cardiothoracic Surgery | Admitting: Cardiothoracic Surgery

## 2020-06-21 ENCOUNTER — Encounter (HOSPITAL_COMMUNITY): Payer: Self-pay

## 2020-06-21 DIAGNOSIS — I509 Heart failure, unspecified: Secondary | ICD-10-CM | POA: Insufficient documentation

## 2020-06-21 DIAGNOSIS — I2510 Atherosclerotic heart disease of native coronary artery without angina pectoris: Secondary | ICD-10-CM | POA: Diagnosis not present

## 2020-06-21 DIAGNOSIS — E1151 Type 2 diabetes mellitus with diabetic peripheral angiopathy without gangrene: Secondary | ICD-10-CM | POA: Diagnosis not present

## 2020-06-21 DIAGNOSIS — Z01812 Encounter for preprocedural laboratory examination: Secondary | ICD-10-CM | POA: Insufficient documentation

## 2020-06-21 DIAGNOSIS — E785 Hyperlipidemia, unspecified: Secondary | ICD-10-CM | POA: Insufficient documentation

## 2020-06-21 DIAGNOSIS — Z01818 Encounter for other preprocedural examination: Secondary | ICD-10-CM | POA: Diagnosis not present

## 2020-06-21 DIAGNOSIS — I110 Hypertensive heart disease with heart failure: Secondary | ICD-10-CM | POA: Insufficient documentation

## 2020-06-21 DIAGNOSIS — Z20822 Contact with and (suspected) exposure to covid-19: Secondary | ICD-10-CM | POA: Insufficient documentation

## 2020-06-21 HISTORY — DX: Atherosclerosis of renal artery: I70.1

## 2020-06-21 LAB — BLOOD GAS, ARTERIAL
Acid-Base Excess: 0.8 mmol/L (ref 0.0–2.0)
Bicarbonate: 25.1 mmol/L (ref 20.0–28.0)
Drawn by: 58793
FIO2: 21
O2 Saturation: 98 %
Patient temperature: 37
pCO2 arterial: 41.3 mmHg (ref 32.0–48.0)
pH, Arterial: 7.4 (ref 7.350–7.450)
pO2, Arterial: 110 mmHg — ABNORMAL HIGH (ref 83.0–108.0)

## 2020-06-21 LAB — COMPREHENSIVE METABOLIC PANEL
ALT: 32 U/L (ref 0–44)
AST: 27 U/L (ref 15–41)
Albumin: 3.9 g/dL (ref 3.5–5.0)
Alkaline Phosphatase: 40 U/L (ref 38–126)
Anion gap: 8 (ref 5–15)
BUN: 18 mg/dL (ref 8–23)
CO2: 24 mmol/L (ref 22–32)
Calcium: 10.6 mg/dL — ABNORMAL HIGH (ref 8.9–10.3)
Chloride: 101 mmol/L (ref 98–111)
Creatinine, Ser: 0.87 mg/dL (ref 0.44–1.00)
GFR, Estimated: >60 mL/min (ref >60)
Glucose, Bld: 154 mg/dL — ABNORMAL HIGH (ref 70–99)
Potassium: 4.6 mmol/L (ref 3.5–5.1)
Sodium: 133 mmol/L — ABNORMAL LOW (ref 135–145)
Total Bilirubin: 0.3 mg/dL (ref 0.3–1.2)
Total Protein: 8 g/dL (ref 6.5–8.1)

## 2020-06-21 LAB — SURGICAL PCR SCREEN
MRSA, PCR: NEGATIVE
Staphylococcus aureus: NEGATIVE

## 2020-06-21 LAB — URINALYSIS, ROUTINE W REFLEX MICROSCOPIC
Bilirubin Urine: NEGATIVE
Glucose, UA: NEGATIVE mg/dL
Hgb urine dipstick: NEGATIVE
Ketones, ur: NEGATIVE mg/dL
Leukocytes,Ua: NEGATIVE
Nitrite: NEGATIVE
Protein, ur: NEGATIVE mg/dL
Specific Gravity, Urine: 1.014 (ref 1.005–1.030)
pH: 6 (ref 5.0–8.0)

## 2020-06-21 LAB — CBC
HCT: 41.7 % (ref 36.0–46.0)
Hemoglobin: 13.7 g/dL (ref 12.0–15.0)
MCH: 31.7 pg (ref 26.0–34.0)
MCHC: 32.9 g/dL (ref 30.0–36.0)
MCV: 96.5 fL (ref 80.0–100.0)
Platelets: 315 10*3/uL (ref 150–400)
RBC: 4.32 MIL/uL (ref 3.87–5.11)
RDW: 13.5 % (ref 11.5–15.5)
WBC: 13.1 10*3/uL — ABNORMAL HIGH (ref 4.0–10.5)
nRBC: 0 % (ref 0.0–0.2)

## 2020-06-21 LAB — APTT: aPTT: 32 seconds (ref 24–36)

## 2020-06-21 LAB — PROTIME-INR
INR: 1 (ref 0.8–1.2)
Prothrombin Time: 13.1 seconds (ref 11.4–15.2)

## 2020-06-21 LAB — HEMOGLOBIN A1C
Hgb A1c MFr Bld: 7.7 % — ABNORMAL HIGH (ref 4.8–5.6)
Mean Plasma Glucose: 174.29 mg/dL

## 2020-06-21 LAB — GLUCOSE, CAPILLARY: Glucose-Capillary: 143 mg/dL — ABNORMAL HIGH (ref 70–99)

## 2020-06-21 NOTE — Progress Notes (Signed)
PCP: Evelina Dun, FNP Cardiologist: [name], MD  EKG: 06/05/20 CXR: 06/21/20 ECHO: 05/10/20 Stress Test: denies Cardiac Cath: 05/09/20  Fasting Blood Sugar- 70'a-300 Checks Blood Sugar_3__ times a day  OSA/CPAP: No  ASA: patient to call office and see if should continue or stop Blood Thinners: Plavix last dose 06/18/20  Covid test 06/21/20 at [facility].  Discussed with Jovita Kussmaul, RN.    Anesthesia Review:  Yes, cardiac hx.  Patient denies shortness of breath, fever, cough, and chest pain at PAT appointment.  Patient verbalized understanding of instructions provided today at the PAT appointment.  Patient asked to review instructions at home and day of surgery.

## 2020-06-21 NOTE — Progress Notes (Signed)
VASCULAR LAB    Pre CABG Dopplers have been performed.  See CV proc for preliminary results.   Reagen Goates, RVT 06/21/2020, 1:49 PM

## 2020-06-22 ENCOUNTER — Encounter (HOSPITAL_COMMUNITY): Payer: Self-pay

## 2020-06-22 LAB — SARS CORONAVIRUS 2 (TAT 6-24 HRS): SARS Coronavirus 2: NEGATIVE

## 2020-06-22 MED ORDER — SODIUM CHLORIDE 0.9 % IV SOLN
INTRAVENOUS | Status: DC
  Filled 2020-06-22: qty 30

## 2020-06-22 MED ORDER — CEFAZOLIN SODIUM-DEXTROSE 2-4 GM/100ML-% IV SOLN
2.0000 g | INTRAVENOUS | Status: AC
  Administered 2020-06-25 (×2): 2 g via INTRAVENOUS
  Filled 2020-06-22: qty 100

## 2020-06-22 MED ORDER — CEFAZOLIN SODIUM-DEXTROSE 2-4 GM/100ML-% IV SOLN
2.0000 g | INTRAVENOUS | Status: DC
  Filled 2020-06-22: qty 100

## 2020-06-22 MED ORDER — TRANEXAMIC ACID (OHS) PUMP PRIME SOLUTION
2.0000 mg/kg | INTRAVENOUS | Status: DC
  Filled 2020-06-22: qty 1.85

## 2020-06-22 MED ORDER — MILRINONE LACTATE IN DEXTROSE 20-5 MG/100ML-% IV SOLN
0.3000 ug/kg/min | INTRAVENOUS | Status: AC
  Administered 2020-06-25: .25 ug/kg/min via INTRAVENOUS
  Filled 2020-06-22: qty 100

## 2020-06-22 MED ORDER — EPINEPHRINE HCL 5 MG/250ML IV SOLN IN NS
0.0000 ug/min | INTRAVENOUS | Status: AC
  Administered 2020-06-25: 3 ug/min via INTRAVENOUS
  Filled 2020-06-22: qty 250

## 2020-06-22 MED ORDER — PHENYLEPHRINE HCL-NACL 20-0.9 MG/250ML-% IV SOLN
30.0000 ug/min | INTRAVENOUS | Status: AC
  Administered 2020-06-25: 40 ug/min via INTRAVENOUS
  Filled 2020-06-22: qty 250

## 2020-06-22 MED ORDER — NOREPINEPHRINE 4 MG/250ML-% IV SOLN
0.0000 ug/min | INTRAVENOUS | Status: DC
  Filled 2020-06-22: qty 250

## 2020-06-22 MED ORDER — INSULIN REGULAR(HUMAN) IN NACL 100-0.9 UT/100ML-% IV SOLN
INTRAVENOUS | Status: AC
  Administered 2020-06-25: 5.5 [IU]/h via INTRAVENOUS
  Filled 2020-06-22: qty 100

## 2020-06-22 MED ORDER — NITROGLYCERIN IN D5W 200-5 MCG/ML-% IV SOLN
2.0000 ug/min | INTRAVENOUS | Status: DC
  Filled 2020-06-22: qty 250

## 2020-06-22 MED ORDER — VANCOMYCIN HCL 1500 MG/300ML IV SOLN
1500.0000 mg | INTRAVENOUS | Status: AC
  Administered 2020-06-25: 1500 mg via INTRAVENOUS
  Filled 2020-06-22: qty 300

## 2020-06-22 MED ORDER — PLASMA-LYTE 148 IV SOLN
INTRAVENOUS | Status: DC
  Filled 2020-06-22: qty 2.5

## 2020-06-22 MED ORDER — TRANEXAMIC ACID 1000 MG/10ML IV SOLN
1.5000 mg/kg/h | INTRAVENOUS | Status: AC
  Administered 2020-06-25: 1.5 mg/kg/h via INTRAVENOUS
  Filled 2020-06-22: qty 25

## 2020-06-22 MED ORDER — POTASSIUM CHLORIDE 2 MEQ/ML IV SOLN
80.0000 meq | INTRAVENOUS | Status: DC
  Filled 2020-06-22: qty 40

## 2020-06-22 MED ORDER — MAGNESIUM SULFATE 50 % IJ SOLN
40.0000 meq | INTRAMUSCULAR | Status: DC
  Filled 2020-06-22: qty 9.85

## 2020-06-22 MED ORDER — TRANEXAMIC ACID (OHS) BOLUS VIA INFUSION
15.0000 mg/kg | INTRAVENOUS | Status: AC
  Administered 2020-06-25: 1390.5 mg via INTRAVENOUS
  Filled 2020-06-22: qty 1391

## 2020-06-22 MED ORDER — DEXMEDETOMIDINE HCL IN NACL 400 MCG/100ML IV SOLN
0.1000 ug/kg/h | INTRAVENOUS | Status: AC
  Administered 2020-06-25: .5 ug/kg/h via INTRAVENOUS
  Filled 2020-06-22: qty 100

## 2020-06-22 NOTE — Anesthesia Preprocedure Evaluation (Addendum)
Anesthesia Evaluation  Patient identified by MRN, date of birth, ID band Patient awake    Reviewed: Allergy & Precautions, NPO status , Patient's Chart, lab work & pertinent test results  Airway Mallampati: III  TM Distance: <3 FB Neck ROM: Full    Dental no notable dental hx.    Pulmonary neg pulmonary ROS, former smoker,    Pulmonary exam normal breath sounds clear to auscultation       Cardiovascular hypertension, + CAD, + Past MI and + Peripheral Vascular Disease  Normal cardiovascular exam Rhythm:Regular Rate:Normal  1. Left ventricular ejection fraction, by estimation, is 60 to 65%. The  left ventricle has normal function. The left ventricle has no regional  wall motion abnormalities. There is moderate concentric left ventricular  hypertrophy. Left ventricular  diastolic parameters are consistent with Grade I diastolic dysfunction  (impaired relaxation). Elevated left atrial pressure.  2. Right ventricular systolic function is normal. The right ventricular  size is normal. There is normal pulmonary artery systolic pressure.  3. Left atrial size was mildly dilated.  4. The mitral valve is normal in structure. Mild mitral valve  regurgitation. Mild mitral stenosis.  5. The aortic valve is normal in structure. Aortic valve regurgitation is  not visualized. No aortic stenosis is present.  6. The inferior vena cava is normal in size with <50% respiratory  variability, suggesting right atrial pressure of 8 mmHg.   Neuro/Psych negative neurological ROS  negative psych ROS   GI/Hepatic negative GI ROS, Neg liver ROS,   Endo/Other  diabetes  Renal/GU negative Renal ROS  negative genitourinary   Musculoskeletal negative musculoskeletal ROS (+)   Abdominal   Peds negative pediatric ROS (+)  Hematology negative hematology ROS (+)   Anesthesia Other Findings   Reproductive/Obstetrics negative OB ROS                             Anesthesia Physical Anesthesia Plan  ASA: III  Anesthesia Plan: General   Post-op Pain Management:    Induction: Intravenous  PONV Risk Score and Plan: 3 and Ondansetron, Dexamethasone and Treatment may vary due to age or medical condition  Airway Management Planned: Oral ETT  Additional Equipment: Arterial line, CVP, PA Cath, TEE and Ultrasound Guidance Line Placement  Intra-op Plan:   Post-operative Plan: Post-operative intubation/ventilation  Informed Consent: I have reviewed the patients History and Physical, chart, labs and discussed the procedure including the risks, benefits and alternatives for the proposed anesthesia with the patient or authorized representative who has indicated his/her understanding and acceptance.     Dental advisory given  Plan Discussed with: CRNA and Surgeon  Anesthesia Plan Comments: (PAT note written 06/22/2020 by Myra Gianotti, PA-C. )       Anesthesia Quick Evaluation

## 2020-06-22 NOTE — Progress Notes (Signed)
Anesthesia Chart Review:  Case: [id number] Date/Time: 06/25/20 0730   Procedures:      CORONARY ARTERY BYPASS GRAFTING (CABG) (N/A Chest)     TRANSESOPHAGEAL ECHOCARDIOGRAM (TEE) (N/A )     INDOCYANINE GREEN FLUORESCENCE IMAGING (ICG) (N/A )   Anesthesia type: General   Pre-op diagnosis: CAD   [facility] / [facility]   Surgeons: Atkins, Broadus Z, MD      DISCUSSION: Patient is a 70 year old female scheduled for the above procedure.  History includes former smoker (quit 02/17/01), post-operative N/V, CAD (MI, s/p DES mid LAD 04/23/12; DES proximal LAD 07/21/13), chronic diastolic CHF, dyspnea, dysrhythmia (SR on 2014 14 day event monitor), HTN, HLD, PVD (left CIA stent 08/31/13; resection of left CFA with insertion of interposition 7 mm Hemashield graft from left EIA to left profunda femoris artery 9/16/15t; angioplasty left CFA 03/19/16), DM2, leukocytosis, renal artery stenosis (s/p angioplasty/stent bilateral renal arteries 03/16/19), obesity.   Last Plavix 06/18/20. 06/21/20 presurgical COVID-19 test negative. Anesthesia team to evaluate on the day of surgery.   VS: BP (!) 159/64   Pulse 81   Temp 37.1 C (Oral)   Resp 20   Ht 5' 2.5" (1.588 m)   Wt 92.7 kg   SpO2 96%   BMI 36.80 kg/m   PROVIDERS: Hawks, Christy A, FNP is PCP  Muhammad, Arida, MD is cardiologist   LABS: Labs reviewed: Acceptable for surgery. WBC 13.1, consistent with previous results.  (all labs ordered are listed, but only abnormal results are displayed)  Labs Reviewed  GLUCOSE, CAPILLARY - Abnormal; Notable for the following components:      Result Value   Glucose-Capillary 143 (*)    All other components within normal limits  BLOOD GAS, ARTERIAL - Abnormal; Notable for the following components:   pO2, Arterial 110 (*)    Allens test (pass/fail) BRACHIAL ARTERY (*)    All other components within normal limits  CBC - Abnormal; Notable for the following components:   WBC 13.1 (*)    All other components  within normal limits  COMPREHENSIVE METABOLIC PANEL - Abnormal; Notable for the following components:   Sodium 133 (*)    Glucose, Bld 154 (*)    Calcium 10.6 (*)    All other components within normal limits  HEMOGLOBIN A1C - Abnormal; Notable for the following components:   Hgb A1c MFr Bld 7.7 (*)    All other components within normal limits  SURGICAL PCR SCREEN  APTT  PROTIME-INR  URINALYSIS, ROUTINE W REFLEX MICROSCOPIC  TYPE AND SCREEN     IMAGES: CXR 06/21/20: FINDINGS: Cardiac shadow is within normal limits. Aortic calcifications are noted. The lungs are clear bilaterally. No acute bony abnormality is seen. IMPRESSION: No active cardiopulmonary disease.   EKG: 06/05/20: NSR, T wave abnormality, consider lateral ischemia   CV: Carotid US 06/21/20: Summary:  Right Carotid: The extracranial vessels were near-normal with only minimal  wall thickening or plaque.  Left Carotid: The extracranial vessels were near-normal with only minimal  wall thickening or plaque.  Vertebrals: Bilateral vertebral arteries demonstrate antegrade flow.  Subclavians: Normal flow hemodynamics were seen in bilateral subclavian arteries.    Echo 05/10/20: IMPRESSIONS  1. Left ventricular ejection fraction, by estimation, is 60 to 65%. The  left ventricle has normal function. The left ventricle has no regional  wall motion abnormalities. There is moderate concentric left ventricular  hypertrophy. Left ventricular  diastolic parameters are consistent with Grade I diastolic dysfunction  (  impaired relaxation). Elevated left atrial pressure.  2. Right ventricular systolic function is normal. The right ventricular  size is normal. There is normal pulmonary artery systolic pressure.  3. Left atrial size was mildly dilated.  4. The mitral valve is normal in structure. Mild mitral valve  regurgitation. Mild mitral stenosis.  5. The aortic valve is normal in structure. Aortic valve regurgitation  is  not visualized. No aortic stenosis is present.  6. The inferior vena cava is normal in size with <50% respiratory  variability, suggesting right atrial pressure of 8 mmHg.    Cardiac Cath/Abdominal Aortogram with LE runoff 05/09/20:  Prox RCA to Mid RCA lesion is 40% stenosed.  Mid RCA to Dist RCA lesion is 40% stenosed.  Ost LAD to Prox LAD lesion is 80% stenosed.  Mid LAD lesion is 30% stenosed.  2nd Diag lesion is 95% stenosed.  Ost Cx to Prox Cx lesion is 95% stenosed.   1.  Heavily calcified coronary arteries with significant three-vessel coronary artery disease.  Patent stents in the LAD but there is significant restenosis in the proximal stent.  In addition, there is severe ostial stenosis in a large second diagonal, severe ostial stenosis in the left circumflex which is heavily calcified and moderate diffuse disease in the right coronary artery. 2.  Left ventricular angiography was not performed.  LVEDP was normal. 3.  Patent bilateral renal artery stents with significant restenosis in the right stent. 4.  Severe bilateral heavily calcified ostial common iliac artery disease extending into the distal aorta. 5.  Right lower extremity: Moderate common femoral artery disease, flush occlusion of the SFA with reconstitution distally via collaterals from the profunda, short occlusion and above-the-knee popliteal artery with collaterals and three-vessel runoff below the knee. 6.  Left lower extremity: Flush occlusion of the SFA with collaterals from the profunda and two-vessel runoff below the knee. 7.  Successful bilateral kissing stent placement of common iliac arteries extending into the distal aorta.  Recommendations: Continue dual antiplatelet therapy for at least 2 weeks. From a cardiac standpoint, recommend evaluation for CABG in few weeks from now once she is no longer in need of clopidogrel.  This can be done as an outpatient.  I am going to obtain an echocardiogram to  finish her cardiac evaluation. From PAD standpoint, I am hopeful that she will be able to heal the wound on her right lower extremity after today's intervention.  However, if that does not happen, the patient will need right femoral popliteal bypass after CABG. Given length of procedure, bilateral access and contrast load, will observe the patient overnight with possible discharge home tomorrow.   Past Medical History:  Diagnosis Date  . CAD (coronary artery disease)    a. 04/2012 NSTEMI: s/p DES to LAD.  Marland Kitchen Chronic diastolic CHF (congestive heart failure) ([place])   . Diabetes mellitus without complication ([place])   . Dysrhythmia   . Environmental and seasonal allergies   . Hyperlipidemia   . Hypertension   . Leukocytosis   . Morbid obesity ([place])   . NSTEMI (non-ST elevated myocardial infarction) ([place]) 04/27/2012  . PONV (postoperative nausea and vomiting)   . PVD (peripheral vascular disease) ([place])    a. 04/2012 ABI: R 0.49, L 0.39. Angiography 06/14: Significant ostial left common iliac artery stenosis extending into the distal aorta, severe left common femoral artery stenosis, bilateral SFA occlusion with heavy calcifications. Functionally, one-vessel runoff bilaterally below the knee  . Renal artery stenosis ([place])  s/p angioplasty/stenting bilateral renal arteries 03/16/19  . Shortness of breath     Past Surgical History:  Procedure Laterality Date  . ABDOMINAL AORTAGRAM N/A 07/21/2012   Procedure: ABDOMINAL AORTAGRAM;  Surgeon: Muhammad A Arida, MD;  [facility]: [facility];  Service: Cardiovascular;  Laterality: N/A;  . ABDOMINAL AORTOGRAM W/LOWER EXTREMITY N/A 05/09/2020   Procedure: ABDOMINAL AORTOGRAM W/LOWER EXTREMITY;  Surgeon: Arida, Muhammad A, MD;  [facility] INVASIVE CV [facility];  Service: Cardiovascular;  Laterality: N/A;  . CARDIAC CATHETERIZATION     stents X 2  . CORONARY ANGIOGRAM  04/27/2012   Procedure: CORONARY ANGIOGRAM;  Surgeon: Philip J Nahser, MD;  [facility]: [facility]ATH  [facility];  Service: Cardiovascular;;  . ENDARTERECTOMY FEMORAL Left 11/02/2013   Procedure: RESECTION LEFT COMMON FEMORAL ARTERY AND INSERTION OF INTERPOSITION 7mm HEMASHIELD GRAFT FROM LEFT EXTERNAL  ILIAC ARTERY TO LEFT PROFUNDA  FEMORIS ARTERY;  Surgeon: James D Lawson, MD;  Location: [facility];  Service: Vascular;  Laterality: Left;  . EYE SURGERY Right 2017  . FRACTURE SURGERY Right Jul 07, 2014   Right Foot-  Pt.fell  at Home  by Heat duct  . ILIAC ARTERY STENT Left 08/31/2013  . LAD stent    . LEFT HEART CATH AND CORONARY ANGIOGRAPHY N/A 05/09/2020   Procedure: LEFT HEART CATH AND CORONARY ANGIOGRAPHY;  Surgeon: Arida, Muhammad A, MD;  [facility];  Service: Cardiovascular;  Laterality: N/A;  . LEFT HEART CATHETERIZATION WITH CORONARY ANGIOGRAM N/A 04/23/2012   Procedure: LEFT HEART CATHETERIZATION WITH CORONARY ANGIOGRAM;  Surgeon: Peter M Jordan, MD;  [facility];  Service: Cardiovascular;  Laterality: N/A;  . LOWER EXTREMITY ANGIOGRAM Left 08/31/2013   Procedure: LOWER EXTREMITY ANGIOGRAM;  Surgeon: Muhammad A Arida, MD;  [facility];  Service: Cardiovascular;  Laterality: Left;  . PERCUTANEOUS STENT INTERVENTION Left 08/31/2013   Procedure: PERCUTANEOUS STENT INTERVENTION;  Surgeon: Muhammad A Arida, MD;  [facility];  Service: Cardiovascular;  Laterality: Left;  Common Iliac artery  . PERIPHERAL VASCULAR CATHETERIZATION N/A 03/19/2016   Procedure: Abdominal Aortogram w/Lower Extremity;  Surgeon: Muhammad A Arida, MD;  [facility];  Service: Cardiovascular;  Laterality: N/A;  . PERIPHERAL VASCULAR CATHETERIZATION  03/19/2016   Procedure: Peripheral Vascular Balloon Angioplasty-CFA Left;  Surgeon: Muhammad A Arida, MD;  [facility];  Service: Cardiovascular;;  . PERIPHERAL VASCULAR INTERVENTION Bilateral 03/16/2019   Procedure: PERIPHERAL VASCULAR INTERVENTION;  Surgeon: Arida, Muhammad A, MD;  [facility];   Service: Cardiovascular;  Laterality: Bilateral;  RENAL  . PERIPHERAL VASCULAR INTERVENTION Bilateral 05/09/2020   Procedure: PERIPHERAL VASCULAR INTERVENTION;  Surgeon: Arida, Muhammad A, MD;  [facility];  Service: Cardiovascular;  Laterality: Bilateral;  Iliac  . RENAL ANGIOGRAPHY  03/16/2019  . RENAL ANGIOGRAPHY Bilateral 03/16/2019   Procedure: RENAL ANGIOGRAPHY;  Surgeon: Arida, Muhammad A, MD;  [facility];  Service: Cardiovascular;  Laterality: Bilateral;  . TUMOR REMOVAL     tumor removed from [place]    MEDICATIONS: . acetaminophen (TYLENOL) 325 MG tablet  . albuterol (PROVENTIL HFA) 108 (90 Base) MCG/ACT inhaler  . amLODipine (NORVASC) 5 MG tablet  . Ascorbic Acid (VITAMIN C) 1000 MG tablet  . aspirin EC 81 MG tablet  . BD ULTRA-FINE PEN NEEDLES 29G X 12.7MM MISC  . blood glucose meter kit and supplies  . Calcium Carbonate-Vit D-Min (CALTRATE 600+D PLUS MINERALS) 600-800 MG-UNIT TABS  . carvedilol (COREG) 6.25   MG tablet  . cephALEXin (KEFLEX) 500 MG capsule  . Cholecalciferol (VITAMIN D-3) 125 MCG (5000 UT) TABS  . clopidogrel (PLAVIX) 75 MG tablet  . fish oil-omega-3 fatty acids 1000 MG capsule  . glucose blood (GLUCOSE METER TEST) test strip  . halobetasol (ULTRAVATE) 0.05 % cream  . hydroxypropyl methylcellulose (ISOPTO TEARS) 2.5 % ophthalmic solution  . insulin aspart (FIASP FLEXTOUCH) 100 UNIT/ML FlexTouch Pen  . Insulin Syringe 27G X 1/2" 0.5 ML MISC  . lisinopril (ZESTRIL) 20 MG tablet  . montelukast (SINGULAIR) 10 MG tablet  . nitroGLYCERIN (NITROSTAT) 0.4 MG SL tablet  . RELION PEN NEEDLES 29G X 12MM MISC  . rosuvastatin (CRESTOR) 40 MG tablet  . Semaglutide,0.25 or 0.5MG/DOS, (OZEMPIC, 0.25 OR 0.5 MG/DOSE,) 2 MG/1.5ML SOPN  . spironolactone (ALDACTONE) 25 MG tablet   No current facility-administered medications for this encounter.    Myra Gianotti, PA-C Surgical Short Stay/Anesthesiology [facility] Phone [phone] [facility]  Phone [phone] 06/22/2020 10:34 AM

## 2020-06-25 ENCOUNTER — Encounter (HOSPITAL_COMMUNITY): Payer: Self-pay | Admitting: Cardiothoracic Surgery

## 2020-06-25 ENCOUNTER — Inpatient Hospital Stay (HOSPITAL_COMMUNITY): Payer: Medicare Other | Admitting: Vascular Surgery

## 2020-06-25 ENCOUNTER — Inpatient Hospital Stay (HOSPITAL_COMMUNITY): Payer: Medicare Other | Admitting: Certified Registered"

## 2020-06-25 ENCOUNTER — Encounter (HOSPITAL_COMMUNITY)
Admission: RE | Disposition: A | Discharge status: Home Health | Payer: Self-pay | Source: Home / Self Care | Attending: Cardiothoracic Surgery

## 2020-06-25 ENCOUNTER — Inpatient Hospital Stay (HOSPITAL_COMMUNITY)
Admission: RE | Admit: 2020-06-25 | Discharge: 2020-07-03 | DRG: 236 | Disposition: A | Discharge status: Home Health | Payer: Medicare Other | Attending: Cardiothoracic Surgery | Admitting: Cardiothoracic Surgery

## 2020-06-25 ENCOUNTER — Inpatient Hospital Stay (HOSPITAL_COMMUNITY): Payer: Medicare Other

## 2020-06-25 ENCOUNTER — Other Ambulatory Visit: Payer: Self-pay

## 2020-06-25 DIAGNOSIS — R0902 Hypoxemia: Secondary | ICD-10-CM | POA: Diagnosis not present

## 2020-06-25 DIAGNOSIS — I493 Ventricular premature depolarization: Secondary | ICD-10-CM | POA: Diagnosis not present

## 2020-06-25 DIAGNOSIS — I70202 Unspecified atherosclerosis of native arteries of extremities, left leg: Secondary | ICD-10-CM | POA: Diagnosis present

## 2020-06-25 DIAGNOSIS — I252 Old myocardial infarction: Secondary | ICD-10-CM | POA: Diagnosis not present

## 2020-06-25 DIAGNOSIS — Z09 Encounter for follow-up examination after completed treatment for conditions other than malignant neoplasm: Secondary | ICD-10-CM

## 2020-06-25 DIAGNOSIS — R0603 Acute respiratory distress: Secondary | ICD-10-CM

## 2020-06-25 DIAGNOSIS — N938 Other specified abnormal uterine and vaginal bleeding: Secondary | ICD-10-CM | POA: Diagnosis present

## 2020-06-25 DIAGNOSIS — I2510 Atherosclerotic heart disease of native coronary artery without angina pectoris: Principal | ICD-10-CM

## 2020-06-25 DIAGNOSIS — I110 Hypertensive heart disease with heart failure: Secondary | ICD-10-CM | POA: Diagnosis not present

## 2020-06-25 DIAGNOSIS — Z8249 Family history of ischemic heart disease and other diseases of the circulatory system: Secondary | ICD-10-CM | POA: Diagnosis not present

## 2020-06-25 DIAGNOSIS — J90 Pleural effusion, not elsewhere classified: Secondary | ICD-10-CM | POA: Diagnosis not present

## 2020-06-25 DIAGNOSIS — R0602 Shortness of breath: Secondary | ICD-10-CM | POA: Diagnosis not present

## 2020-06-25 DIAGNOSIS — Z83438 Family history of other disorder of lipoprotein metabolism and other lipidemia: Secondary | ICD-10-CM | POA: Diagnosis not present

## 2020-06-25 DIAGNOSIS — I517 Cardiomegaly: Secondary | ICD-10-CM | POA: Diagnosis not present

## 2020-06-25 DIAGNOSIS — E1151 Type 2 diabetes mellitus with diabetic peripheral angiopathy without gangrene: Secondary | ICD-10-CM | POA: Diagnosis not present

## 2020-06-25 DIAGNOSIS — Z833 Family history of diabetes mellitus: Secondary | ICD-10-CM

## 2020-06-25 DIAGNOSIS — J9811 Atelectasis: Secondary | ICD-10-CM | POA: Diagnosis not present

## 2020-06-25 DIAGNOSIS — Z951 Presence of aortocoronary bypass graft: Secondary | ICD-10-CM | POA: Diagnosis not present

## 2020-06-25 DIAGNOSIS — E871 Hypo-osmolality and hyponatremia: Secondary | ICD-10-CM | POA: Diagnosis not present

## 2020-06-25 DIAGNOSIS — E785 Hyperlipidemia, unspecified: Secondary | ICD-10-CM | POA: Diagnosis not present

## 2020-06-25 DIAGNOSIS — D62 Acute posthemorrhagic anemia: Secondary | ICD-10-CM | POA: Diagnosis not present

## 2020-06-25 DIAGNOSIS — E1159 Type 2 diabetes mellitus with other circulatory complications: Secondary | ICD-10-CM | POA: Diagnosis not present

## 2020-06-25 DIAGNOSIS — Z87891 Personal history of nicotine dependence: Secondary | ICD-10-CM

## 2020-06-25 DIAGNOSIS — I5032 Chronic diastolic (congestive) heart failure: Secondary | ICD-10-CM | POA: Diagnosis present

## 2020-06-25 DIAGNOSIS — R531 Weakness: Secondary | ICD-10-CM | POA: Diagnosis not present

## 2020-06-25 DIAGNOSIS — I25119 Atherosclerotic heart disease of native coronary artery with unspecified angina pectoris: Secondary | ICD-10-CM | POA: Diagnosis not present

## 2020-06-25 DIAGNOSIS — I5033 Acute on chronic diastolic (congestive) heart failure: Secondary | ICD-10-CM | POA: Diagnosis not present

## 2020-06-25 DIAGNOSIS — R21 Rash and other nonspecific skin eruption: Secondary | ICD-10-CM | POA: Diagnosis not present

## 2020-06-25 DIAGNOSIS — Z9889 Other specified postprocedural states: Secondary | ICD-10-CM | POA: Diagnosis not present

## 2020-06-25 DIAGNOSIS — I340 Nonrheumatic mitral (valve) insufficiency: Secondary | ICD-10-CM | POA: Diagnosis not present

## 2020-06-25 HISTORY — PX: TEE WITHOUT CARDIOVERSION: SHX5443

## 2020-06-25 HISTORY — PX: CORONARY ARTERY BYPASS GRAFT: SHX141

## 2020-06-25 LAB — POCT I-STAT, CHEM 8
BUN: 17 mg/dL (ref 8–23)
BUN: 13 mg/dL (ref 8–23)
BUN: 16 mg/dL (ref 8–23)
BUN: 14 mg/dL (ref 8–23)
BUN: 14 mg/dL (ref 8–23)
Calcium, Ion: 1.26 mmol/L (ref 1.15–1.40)
Calcium, Ion: 0.98 mmol/L — ABNORMAL LOW (ref 1.15–1.40)
Calcium, Ion: 1.24 mmol/L (ref 1.15–1.40)
Calcium, Ion: 1.02 mmol/L — ABNORMAL LOW (ref 1.15–1.40)
Calcium, Ion: 1.06 mmol/L — ABNORMAL LOW (ref 1.15–1.40)
Chloride: 98 mmol/L (ref 98–111)
Chloride: 97 mmol/L — ABNORMAL LOW (ref 98–111)
Chloride: 98 mmol/L (ref 98–111)
Chloride: 98 mmol/L (ref 98–111)
Chloride: 98 mmol/L (ref 98–111)
Creatinine, Ser: 0.6 mg/dL (ref 0.44–1.00)
Creatinine, Ser: 0.4 mg/dL — ABNORMAL LOW (ref 0.44–1.00)
Creatinine, Ser: 0.6 mg/dL (ref 0.44–1.00)
Creatinine, Ser: 0.5 mg/dL (ref 0.44–1.00)
Creatinine, Ser: 0.5 mg/dL (ref 0.44–1.00)
Glucose, Bld: 177 mg/dL — ABNORMAL HIGH (ref 70–99)
Glucose, Bld: 134 mg/dL — ABNORMAL HIGH (ref 70–99)
Glucose, Bld: 172 mg/dL — ABNORMAL HIGH (ref 70–99)
Glucose, Bld: 147 mg/dL — ABNORMAL HIGH (ref 70–99)
Glucose, Bld: 161 mg/dL — ABNORMAL HIGH (ref 70–99)
HCT: 37 % (ref 36.0–46.0)
HCT: 25 % — ABNORMAL LOW (ref 36.0–46.0)
HCT: 35 % — ABNORMAL LOW (ref 36.0–46.0)
HCT: 25 % — ABNORMAL LOW (ref 36.0–46.0)
HCT: 27 % — ABNORMAL LOW (ref 36.0–46.0)
Hemoglobin: 12.6 g/dL (ref 12.0–15.0)
Hemoglobin: 11.9 g/dL — ABNORMAL LOW (ref 12.0–15.0)
Hemoglobin: 8.5 g/dL — ABNORMAL LOW (ref 12.0–15.0)
Hemoglobin: 8.5 g/dL — ABNORMAL LOW (ref 12.0–15.0)
Hemoglobin: 9.2 g/dL — ABNORMAL LOW (ref 12.0–15.0)
Potassium: 3.8 mmol/L (ref 3.5–5.1)
Potassium: 3.7 mmol/L (ref 3.5–5.1)
Potassium: 4.6 mmol/L (ref 3.5–5.1)
Potassium: 3.8 mmol/L (ref 3.5–5.1)
Potassium: 4.7 mmol/L (ref 3.5–5.1)
Sodium: 136 mmol/L (ref 135–145)
Sodium: 135 mmol/L (ref 135–145)
Sodium: 135 mmol/L (ref 135–145)
Sodium: 134 mmol/L — ABNORMAL LOW (ref 135–145)
Sodium: 135 mmol/L (ref 135–145)
TCO2: 29 mmol/L (ref 22–32)
TCO2: 26 mmol/L (ref 22–32)
TCO2: 26 mmol/L (ref 22–32)
TCO2: 26 mmol/L (ref 22–32)
TCO2: 27 mmol/L (ref 22–32)

## 2020-06-25 LAB — GLUCOSE, CAPILLARY
Glucose-Capillary: 173 mg/dL — ABNORMAL HIGH (ref 70–99)
Glucose-Capillary: 149 mg/dL — ABNORMAL HIGH (ref 70–99)
Glucose-Capillary: 141 mg/dL — ABNORMAL HIGH (ref 70–99)
Glucose-Capillary: 136 mg/dL — ABNORMAL HIGH (ref 70–99)
Glucose-Capillary: 137 mg/dL — ABNORMAL HIGH (ref 70–99)
Glucose-Capillary: 141 mg/dL — ABNORMAL HIGH (ref 70–99)
Glucose-Capillary: 140 mg/dL — ABNORMAL HIGH (ref 70–99)
Glucose-Capillary: 131 mg/dL — ABNORMAL HIGH (ref 70–99)
Glucose-Capillary: 141 mg/dL — ABNORMAL HIGH (ref 70–99)
Glucose-Capillary: 133 mg/dL — ABNORMAL HIGH (ref 70–99)
Glucose-Capillary: 133 mg/dL — ABNORMAL HIGH (ref 70–99)
Glucose-Capillary: 160 mg/dL — ABNORMAL HIGH (ref 70–99)

## 2020-06-25 LAB — POCT I-STAT 7, (LYTES, BLD GAS, ICA,H+H)
Acid-Base Excess: 1 mmol/L (ref 0.0–2.0)
Acid-Base Excess: 0 mmol/L (ref 0.0–2.0)
Acid-Base Excess: 3 mmol/L — ABNORMAL HIGH (ref 0.0–2.0)
Acid-base deficit: 2 mmol/L (ref 0.0–2.0)
Bicarbonate: 27.9 mmol/L (ref 20.0–28.0)
Bicarbonate: 26 mmol/L (ref 20.0–28.0)
Bicarbonate: 27.9 mmol/L (ref 20.0–28.0)
Bicarbonate: 24.8 mmol/L (ref 20.0–28.0)
Calcium, Ion: 1.28 mmol/L (ref 1.15–1.40)
Calcium, Ion: 1.06 mmol/L — ABNORMAL LOW (ref 1.15–1.40)
Calcium, Ion: 1.01 mmol/L — ABNORMAL LOW (ref 1.15–1.40)
Calcium, Ion: 1.15 mmol/L (ref 1.15–1.40)
HCT: 37 % (ref 36.0–46.0)
HCT: 26 % — ABNORMAL LOW (ref 36.0–46.0)
HCT: 25 % — ABNORMAL LOW (ref 36.0–46.0)
HCT: 31 % — ABNORMAL LOW (ref 36.0–46.0)
Hemoglobin: 12.6 g/dL (ref 12.0–15.0)
Hemoglobin: 8.8 g/dL — ABNORMAL LOW (ref 12.0–15.0)
Hemoglobin: 8.5 g/dL — ABNORMAL LOW (ref 12.0–15.0)
Hemoglobin: 10.5 g/dL — ABNORMAL LOW (ref 12.0–15.0)
O2 Saturation: 100 %
O2 Saturation: 100 %
O2 Saturation: 100 %
O2 Saturation: 98 %
Patient temperature: 34.8
Potassium: 3.8 mmol/L (ref 3.5–5.1)
Potassium: 4 mmol/L (ref 3.5–5.1)
Potassium: 3.9 mmol/L (ref 3.5–5.1)
Potassium: 3.7 mmol/L (ref 3.5–5.1)
Sodium: 136 mmol/L (ref 135–145)
Sodium: 136 mmol/L (ref 135–145)
Sodium: 135 mmol/L (ref 135–145)
Sodium: 138 mmol/L (ref 135–145)
TCO2: 29 mmol/L (ref 22–32)
TCO2: 27 mmol/L (ref 22–32)
TCO2: 29 mmol/L (ref 22–32)
TCO2: 26 mmol/L (ref 22–32)
pCO2 arterial: 50.9 mmHg — ABNORMAL HIGH (ref 32.0–48.0)
pCO2 arterial: 45.9 mmHg (ref 32.0–48.0)
pCO2 arterial: 43.6 mmHg (ref 32.0–48.0)
pCO2 arterial: 46 mmHg (ref 32.0–48.0)
pH, Arterial: 7.347 — ABNORMAL LOW (ref 7.350–7.450)
pH, Arterial: 7.361 (ref 7.350–7.450)
pH, Arterial: 7.414 (ref 7.350–7.450)
pH, Arterial: 7.329 — ABNORMAL LOW (ref 7.350–7.450)
pO2, Arterial: 307 mmHg — ABNORMAL HIGH (ref 83.0–108.0)
pO2, Arterial: 455 mmHg — ABNORMAL HIGH (ref 83.0–108.0)
pO2, Arterial: 533 mmHg — ABNORMAL HIGH (ref 83.0–108.0)
pO2, Arterial: 106 mmHg (ref 83.0–108.0)

## 2020-06-25 LAB — PROTIME-INR
INR: 1.4 — ABNORMAL HIGH (ref 0.8–1.2)
Prothrombin Time: 16.7 seconds — ABNORMAL HIGH (ref 11.4–15.2)

## 2020-06-25 LAB — POCT I-STAT EG7
Acid-Base Excess: 1 mmol/L (ref 0.0–2.0)
Bicarbonate: 26.7 mmol/L (ref 20.0–28.0)
Calcium, Ion: 1.12 mmol/L — ABNORMAL LOW (ref 1.15–1.40)
HCT: 27 % — ABNORMAL LOW (ref 36.0–46.0)
Hemoglobin: 9.2 g/dL — ABNORMAL LOW (ref 12.0–15.0)
O2 Saturation: 73 %
Potassium: 4.8 mmol/L (ref 3.5–5.1)
Sodium: 136 mmol/L (ref 135–145)
TCO2: 28 mmol/L (ref 22–32)
pCO2, Ven: 48.7 mmHg (ref 44.0–60.0)
pH, Ven: 7.347 (ref 7.250–7.430)
pO2, Ven: 41 mmHg (ref 32.0–45.0)

## 2020-06-25 LAB — CBC
HCT: 29.3 % — ABNORMAL LOW (ref 36.0–46.0)
HCT: 26.2 % — ABNORMAL LOW (ref 36.0–46.0)
Hemoglobin: 9.7 g/dL — ABNORMAL LOW (ref 12.0–15.0)
Hemoglobin: 8.7 g/dL — ABNORMAL LOW (ref 12.0–15.0)
MCH: 32.1 pg (ref 26.0–34.0)
MCH: 32.1 pg (ref 26.0–34.0)
MCHC: 33.1 g/dL (ref 30.0–36.0)
MCHC: 33.2 g/dL (ref 30.0–36.0)
MCV: 97 fL (ref 80.0–100.0)
MCV: 96.7 fL (ref 80.0–100.0)
Platelets: 161 10*3/uL (ref 150–400)
Platelets: 165 10*3/uL (ref 150–400)
RBC: 3.02 MIL/uL — ABNORMAL LOW (ref 3.87–5.11)
RBC: 2.71 MIL/uL — ABNORMAL LOW (ref 3.87–5.11)
RDW: 13 % (ref 11.5–15.5)
RDW: 13.2 % (ref 11.5–15.5)
WBC: 15.8 10*3/uL — ABNORMAL HIGH (ref 4.0–10.5)
WBC: 15.4 10*3/uL — ABNORMAL HIGH (ref 4.0–10.5)
nRBC: 0 % (ref 0.0–0.2)
nRBC: 0 % (ref 0.0–0.2)

## 2020-06-25 LAB — BASIC METABOLIC PANEL
Anion gap: 5 (ref 5–15)
BUN: 11 mg/dL (ref 8–23)
CO2: 24 mmol/L (ref 22–32)
Calcium: 7.5 mg/dL — ABNORMAL LOW (ref 8.9–10.3)
Chloride: 104 mmol/L (ref 98–111)
Creatinine, Ser: 0.68 mg/dL (ref 0.44–1.00)
GFR, Estimated: >60 mL/min (ref >60)
Glucose, Bld: 124 mg/dL — ABNORMAL HIGH (ref 70–99)
Potassium: 4.2 mmol/L (ref 3.5–5.1)
Sodium: 133 mmol/L — ABNORMAL LOW (ref 135–145)

## 2020-06-25 LAB — ECHO INTRAOPERATIVE TEE
Height: 62.5 in
S' Lateral: 2.2 cm
Weight: 3269.86 oz

## 2020-06-25 LAB — APTT: aPTT: 37 seconds — ABNORMAL HIGH (ref 24–36)

## 2020-06-25 LAB — HEMOGLOBIN AND HEMATOCRIT, BLOOD
HCT: 24.2 % — ABNORMAL LOW (ref 36.0–46.0)
Hemoglobin: 8.1 g/dL — ABNORMAL LOW (ref 12.0–15.0)

## 2020-06-25 LAB — COOXEMETRY PANEL
Carboxyhemoglobin: 0.9 % (ref 0.5–1.5)
Methemoglobin: 1.2 % (ref 0.0–1.5)
O2 Saturation: 43.5 %
Total hemoglobin: 8.2 g/dL — ABNORMAL LOW (ref 12.0–16.0)

## 2020-06-25 LAB — MAGNESIUM: Magnesium: 2.6 mg/dL — ABNORMAL HIGH (ref 1.7–2.4)

## 2020-06-25 LAB — PLATELET COUNT: Platelets: 171 10*3/uL (ref 150–400)

## 2020-06-25 SURGERY — CORONARY ARTERY BYPASS GRAFTING (CABG)
Anesthesia: General | Site: Chest

## 2020-06-25 MED ORDER — FENTANYL CITRATE (PF) 250 MCG/5ML IJ SOLN
INTRAMUSCULAR | Status: DC | PRN
  Administered 2020-06-25 (×3): 100 ug via INTRAVENOUS
  Administered 2020-06-25 (×2): 150 ug via INTRAVENOUS
  Administered 2020-06-25 (×3): 50 ug via INTRAVENOUS
  Administered 2020-06-25 (×2): 100 ug via INTRAVENOUS
  Administered 2020-06-25: 50 ug via INTRAVENOUS
  Administered 2020-06-25: 100 ug via INTRAVENOUS
  Administered 2020-06-25: 150 ug via INTRAVENOUS

## 2020-06-25 MED ORDER — SODIUM CHLORIDE 0.9 % IV SOLN: 250.0000 mL | INTRAVENOUS | Status: DC

## 2020-06-25 MED ORDER — MIDAZOLAM HCL 2 MG/2ML IJ SOLN: 2.0000 mg | INTRAMUSCULAR | Status: DC | PRN

## 2020-06-25 MED ORDER — METOPROLOL TARTRATE 12.5 MG HALF TABLET: 12.5000 mg | ORAL_TABLET | Freq: Once | ORAL | Status: DC

## 2020-06-25 MED ORDER — FAMOTIDINE IN NACL 20-0.9 MG/50ML-% IV SOLN
20.0000 mg | Freq: Two times a day (BID) | INTRAVENOUS | Status: DC
  Filled 2020-06-25: qty 50

## 2020-06-25 MED ORDER — PANTOPRAZOLE SODIUM 40 MG PO TBEC
40.0000 mg | DELAYED_RELEASE_TABLET | Freq: Every day | ORAL | Status: DC
  Administered 2020-06-27 – 2020-07-03 (×7): 40 mg via ORAL
  Filled 2020-06-25 (×7): qty 1

## 2020-06-25 MED ORDER — BUPIVACAINE LIPOSOME 1.3 % IJ SUSP
INTRAMUSCULAR | Status: AC
  Filled 2020-06-25: qty 20

## 2020-06-25 MED ORDER — CHLORHEXIDINE GLUCONATE 0.12 % MT SOLN
15.0000 mL | Freq: Once | OROMUCOSAL | Status: AC
Start: 2020-06-26 — End: 2020-06-25

## 2020-06-25 MED ORDER — BUPIVACAINE HCL (PF) 0.5 % IJ SOLN
INTRAMUSCULAR | Status: AC
  Filled 2020-06-25: qty 30

## 2020-06-25 MED ORDER — PLATELET RICH PLASMA OPTIME
Status: DC | PRN
  Administered 2020-06-25: 10 mL

## 2020-06-25 MED ORDER — LEVALBUTEROL TARTRATE 45 MCG/ACT IN AERO: 2.0000 | INHALATION_SPRAY | Freq: Four times a day (QID) | RESPIRATORY_TRACT | Status: DC

## 2020-06-25 MED ORDER — ROSUVASTATIN CALCIUM 20 MG PO TABS
40.0000 mg | ORAL_TABLET | Freq: Every day | ORAL | Status: DC
  Administered 2020-06-26 – 2020-07-03 (×8): 40 mg via ORAL
  Filled 2020-06-25 (×10): qty 2

## 2020-06-25 MED ORDER — PLATELET POOR PLASMA OPTIME
Status: DC | PRN
  Administered 2020-06-25: 10 mL

## 2020-06-25 MED ORDER — CEFAZOLIN SODIUM-DEXTROSE 2-4 GM/100ML-% IV SOLN
2.0000 g | Freq: Three times a day (TID) | INTRAVENOUS | Status: AC
  Administered 2020-06-25 – 2020-06-27 (×6): 2 g via INTRAVENOUS
  Filled 2020-06-25 (×6): qty 100

## 2020-06-25 MED ORDER — CHLORHEXIDINE GLUCONATE 0.12 % MT SOLN
OROMUCOSAL | Status: AC
  Administered 2020-06-25: 15 mL via OROMUCOSAL
  Filled 2020-06-25: qty 15

## 2020-06-25 MED ORDER — POTASSIUM CHLORIDE 10 MEQ/50ML IV SOLN
10.0000 meq | INTRAVENOUS | Status: AC
  Administered 2020-06-25 (×2): 10 meq via INTRAVENOUS

## 2020-06-25 MED ORDER — FENTANYL CITRATE (PF) 250 MCG/5ML IJ SOLN
INTRAMUSCULAR | Status: AC
  Filled 2020-06-25: qty 25

## 2020-06-25 MED ORDER — LACTATED RINGERS IV SOLN: INTRAVENOUS | Status: DC | PRN

## 2020-06-25 MED ORDER — BISACODYL 10 MG RE SUPP: 10.0000 mg | Freq: Every day | RECTAL | Status: DC

## 2020-06-25 MED ORDER — NITROGLYCERIN IN D5W 200-5 MCG/ML-% IV SOLN
INTRAVENOUS | Status: DC | PRN
  Administered 2020-06-25: .04 mg via INTRAVENOUS

## 2020-06-25 MED ORDER — MIDAZOLAM HCL (PF) 10 MG/2ML IJ SOLN
INTRAMUSCULAR | Status: AC
  Filled 2020-06-25: qty 2

## 2020-06-25 MED ORDER — PLASMA-LYTE A IV SOLN: INTRAVENOUS | Status: DC

## 2020-06-25 MED ORDER — MILRINONE LACTATE IN DEXTROSE 20-5 MG/100ML-% IV SOLN
0.2500 ug/kg/min | INTRAVENOUS | Status: DC
  Administered 2020-06-25: 0.25 ug/kg/min via INTRAVENOUS
  Filled 2020-06-25: qty 100

## 2020-06-25 MED ORDER — METOPROLOL TARTRATE 25 MG/10 ML ORAL SUSPENSION: 12.5000 mg | Freq: Two times a day (BID) | ORAL | Status: DC

## 2020-06-25 MED ORDER — MORPHINE SULFATE (PF) 2 MG/ML IV SOLN
1.0000 mg | INTRAVENOUS | Status: DC | PRN
Start: 2020-06-25 — End: 2020-06-27
  Administered 2020-06-25: 4 mg via INTRAVENOUS
  Administered 2020-06-25: 2 mg via INTRAVENOUS
  Administered 2020-06-25: 4 mg via INTRAVENOUS
  Administered 2020-06-27: 1 mg via INTRAVENOUS
  Filled 2020-06-25 (×2): qty 1
  Filled 2020-06-25 (×2): qty 2

## 2020-06-25 MED ORDER — CHLORHEXIDINE GLUCONATE CLOTH 2 % EX PADS
6.0000 | MEDICATED_PAD | Freq: Every day | CUTANEOUS | Status: DC
  Administered 2020-06-26 – 2020-06-27 (×3): 6 via TOPICAL

## 2020-06-25 MED ORDER — 0.9 % SODIUM CHLORIDE (POUR BTL) OPTIME
TOPICAL | Status: DC | PRN
  Administered 2020-06-25: 5000 mL

## 2020-06-25 MED ORDER — SODIUM CHLORIDE 0.9% FLUSH: 3.0000 mL | INTRAVENOUS | Status: DC | PRN

## 2020-06-25 MED ORDER — STERILE WATER FOR INJECTION IJ SOLN
INTRAMUSCULAR | Status: AC
  Filled 2020-06-25: qty 10

## 2020-06-25 MED ORDER — HEMOSTATIC AGENTS (NO CHARGE) OPTIME
TOPICAL | Status: DC | PRN
  Administered 2020-06-25: 1 via TOPICAL

## 2020-06-25 MED ORDER — CHLORHEXIDINE GLUCONATE 4 % EX LIQD: 30.0000 mL | CUTANEOUS | Status: DC

## 2020-06-25 MED ORDER — SODIUM CHLORIDE 0.9 % IV SOLN: INTRAVENOUS | Status: DC | PRN

## 2020-06-25 MED ORDER — OXYCODONE HCL 5 MG PO TABS
5.0000 mg | ORAL_TABLET | ORAL | Status: DC | PRN
  Administered 2020-06-25: 10 mg via ORAL
  Administered 2020-06-26 – 2020-06-27 (×2): 5 mg via ORAL
  Filled 2020-06-25: qty 2
  Filled 2020-06-25 (×3): qty 1

## 2020-06-25 MED ORDER — ACETAMINOPHEN 160 MG/5ML PO SOLN: 650.0000 mg | Freq: Once | ORAL | Status: AC

## 2020-06-25 MED ORDER — ACETAMINOPHEN 160 MG/5ML PO SOLN
1000.0000 mg | Freq: Four times a day (QID) | ORAL | Status: AC
  Administered 2020-06-28 – 2020-06-30 (×7): 1000 mg
  Filled 2020-06-25 (×9): qty 40.6

## 2020-06-25 MED ORDER — VANCOMYCIN HCL 1000 MG IV SOLR
INTRAVENOUS | Status: AC
  Filled 2020-06-25: qty 3000

## 2020-06-25 MED ORDER — ALBUMIN HUMAN 5 % IV SOLN: INTRAVENOUS | Status: DC | PRN

## 2020-06-25 MED ORDER — ACETAMINOPHEN 650 MG RE SUPP
650.0000 mg | Freq: Once | RECTAL | Status: AC
  Administered 2020-06-25: 650 mg via RECTAL

## 2020-06-25 MED ORDER — SODIUM CHLORIDE 0.9 % IV SOLN
12.5000 mg | Freq: Once | INTRAVENOUS | Status: AC
  Administered 2020-06-25: 12.5 mg via INTRAVENOUS
  Filled 2020-06-25: qty 0.5

## 2020-06-25 MED ORDER — BISACODYL 5 MG PO TBEC
10.0000 mg | DELAYED_RELEASE_TABLET | Freq: Every day | ORAL | Status: DC
  Administered 2020-06-26 – 2020-07-03 (×6): 10 mg via ORAL
  Filled 2020-06-25 (×7): qty 2

## 2020-06-25 MED ORDER — LACTATED RINGERS IV SOLN: 500.0000 mL | Freq: Once | INTRAVENOUS | Status: DC | PRN

## 2020-06-25 MED ORDER — NITROGLYCERIN IN D5W 200-5 MCG/ML-% IV SOLN
0.0000 ug/min | INTRAVENOUS | Status: DC
  Administered 2020-06-26: 5 ug/min via INTRAVENOUS
  Filled 2020-06-25: qty 250

## 2020-06-25 MED ORDER — ONDANSETRON HCL 4 MG/2ML IJ SOLN
4.0000 mg | Freq: Four times a day (QID) | INTRAMUSCULAR | Status: DC | PRN
  Administered 2020-06-25 – 2020-07-02 (×8): 4 mg via INTRAVENOUS
  Filled 2020-06-25 (×9): qty 2

## 2020-06-25 MED ORDER — PROPOFOL 10 MG/ML IV BOLUS
INTRAVENOUS | Status: DC | PRN
  Administered 2020-06-25: 30 mg via INTRAVENOUS
  Administered 2020-06-25: 120 mg via INTRAVENOUS
  Administered 2020-06-25: 40 mg via INTRAVENOUS

## 2020-06-25 MED ORDER — PLASMA-LYTE 148 IV SOLN
INTRAVENOUS | Status: DC | PRN
  Administered 2020-06-25: 500 mL

## 2020-06-25 MED ORDER — ACETAMINOPHEN 500 MG PO TABS
1000.0000 mg | ORAL_TABLET | Freq: Four times a day (QID) | ORAL | Status: AC
  Administered 2020-06-26 – 2020-06-30 (×9): 1000 mg via ORAL
  Filled 2020-06-25 (×10): qty 2

## 2020-06-25 MED ORDER — THROMBIN 5000 UNITS EX SOLR
INTRAVENOUS | Status: DC | PRN
  Administered 2020-06-25: 2 mL

## 2020-06-25 MED ORDER — MAGNESIUM SULFATE 4 GM/100ML IV SOLN
4.0000 g | Freq: Once | INTRAVENOUS | Status: AC
  Administered 2020-06-25: 4 g via INTRAVENOUS
  Filled 2020-06-25: qty 100

## 2020-06-25 MED ORDER — VANCOMYCIN HCL IN DEXTROSE 1-5 GM/200ML-% IV SOLN
1000.0000 mg | Freq: Once | INTRAVENOUS | Status: AC
  Administered 2020-06-25: 1000 mg via INTRAVENOUS
  Filled 2020-06-25: qty 200

## 2020-06-25 MED ORDER — PHENYLEPHRINE HCL-NACL 20-0.9 MG/250ML-% IV SOLN
0.0000 ug/min | INTRAVENOUS | Status: DC
  Administered 2020-06-25: 20 ug/min via INTRAVENOUS
  Filled 2020-06-25: qty 250

## 2020-06-25 MED ORDER — PHENYLEPHRINE HCL-NACL 10-0.9 MG/250ML-% IV SOLN: INTRAVENOUS | Status: DC | PRN

## 2020-06-25 MED ORDER — ASPIRIN EC 325 MG PO TBEC
325.0000 mg | DELAYED_RELEASE_TABLET | Freq: Every day | ORAL | Status: DC
  Administered 2020-06-26: 325 mg via ORAL
  Filled 2020-06-25: qty 1

## 2020-06-25 MED ORDER — EPINEPHRINE HCL 5 MG/250ML IV SOLN IN NS: 0.5000 ug/min | INTRAVENOUS | Status: DC

## 2020-06-25 MED ORDER — ROCURONIUM BROMIDE 10 MG/ML (PF) SYRINGE
PREFILLED_SYRINGE | INTRAVENOUS | Status: DC | PRN
  Administered 2020-06-25: 50 mg via INTRAVENOUS
  Administered 2020-06-25: 80 mg via INTRAVENOUS
  Administered 2020-06-25: 50 mg via INTRAVENOUS
  Administered 2020-06-25: 20 mg via INTRAVENOUS

## 2020-06-25 MED ORDER — DEXTROSE 50 % IV SOLN: 0.0000 mL | INTRAVENOUS | Status: DC | PRN

## 2020-06-25 MED ORDER — PROPOFOL 10 MG/ML IV BOLUS
INTRAVENOUS | Status: AC
  Filled 2020-06-25: qty 20

## 2020-06-25 MED ORDER — LACTATED RINGERS IV SOLN: INTRAVENOUS | Status: DC

## 2020-06-25 MED ORDER — TRAMADOL HCL 50 MG PO TABS
50.0000 mg | ORAL_TABLET | ORAL | Status: DC | PRN
  Administered 2020-06-26 – 2020-06-30 (×4): 100 mg via ORAL
  Administered 2020-07-01 – 2020-07-03 (×6): 50 mg via ORAL
  Filled 2020-06-25: qty 2
  Filled 2020-06-25 (×2): qty 1
  Filled 2020-06-25 (×2): qty 2
  Filled 2020-06-25 (×2): qty 1
  Filled 2020-06-25: qty 2
  Filled 2020-06-25: qty 1
  Filled 2020-06-25: qty 2
  Filled 2020-06-25 (×2): qty 1

## 2020-06-25 MED ORDER — ONDANSETRON HCL 4 MG/2ML IJ SOLN
INTRAMUSCULAR | Status: DC | PRN
  Administered 2020-06-25: 4 mg via INTRAVENOUS

## 2020-06-25 MED ORDER — METOPROLOL TARTRATE 12.5 MG HALF TABLET
12.5000 mg | ORAL_TABLET | Freq: Two times a day (BID) | ORAL | Status: DC
  Administered 2020-06-26 (×2): 12.5 mg via ORAL
  Filled 2020-06-25 (×2): qty 1

## 2020-06-25 MED ORDER — FAMOTIDINE IN NACL 20-0.9 MG/50ML-% IV SOLN
20.0000 mg | Freq: Two times a day (BID) | INTRAVENOUS | Status: AC
  Administered 2020-06-25: 20 mg via INTRAVENOUS
  Filled 2020-06-25: qty 50

## 2020-06-25 MED ORDER — CHLORHEXIDINE GLUCONATE 0.12 % MT SOLN
15.0000 mL | OROMUCOSAL | Status: AC
  Administered 2020-06-25: 15 mL via OROMUCOSAL

## 2020-06-25 MED ORDER — SODIUM CHLORIDE 0.9 % IV SOLN: INTRAVENOUS | Status: DC

## 2020-06-25 MED ORDER — ASPIRIN 81 MG PO CHEW
324.0000 mg | CHEWABLE_TABLET | Freq: Every day | ORAL | Status: DC
  Filled 2020-06-25: qty 4

## 2020-06-25 MED ORDER — PROTAMINE SULFATE 10 MG/ML IV SOLN
INTRAVENOUS | Status: DC | PRN
  Administered 2020-06-25: 320 mg via INTRAVENOUS

## 2020-06-25 MED ORDER — SODIUM CHLORIDE 0.45 % IV SOLN: INTRAVENOUS | Status: DC | PRN

## 2020-06-25 MED ORDER — INSULIN REGULAR(HUMAN) IN NACL 100-0.9 UT/100ML-% IV SOLN
INTRAVENOUS | Status: DC
  Administered 2020-06-26: 2 [IU]/h via INTRAVENOUS
  Administered 2020-06-27: 2.8 [IU]/h via INTRAVENOUS
  Filled 2020-06-25 (×2): qty 100

## 2020-06-25 MED ORDER — BUPIVACAINE LIPOSOME 1.3 % IJ SUSP
INTRAMUSCULAR | Status: DC | PRN
  Administered 2020-06-25: 50 mL

## 2020-06-25 MED ORDER — METOPROLOL TARTRATE 5 MG/5ML IV SOLN
2.5000 mg | INTRAVENOUS | Status: DC | PRN
  Administered 2020-06-26 (×2): 5 mg via INTRAVENOUS
  Filled 2020-06-25 (×2): qty 5

## 2020-06-25 MED ORDER — DEXMEDETOMIDINE HCL IN NACL 400 MCG/100ML IV SOLN: 0.0000 ug/kg/h | INTRAVENOUS | Status: DC

## 2020-06-25 MED ORDER — HEPARIN SODIUM (PORCINE) 1000 UNIT/ML IJ SOLN
INTRAMUSCULAR | Status: DC | PRN
  Administered 2020-06-25: 33000 [IU] via INTRAVENOUS

## 2020-06-25 MED ORDER — LEVALBUTEROL HCL 0.63 MG/3ML IN NEBU
0.6300 mg | INHALATION_SOLUTION | Freq: Four times a day (QID) | RESPIRATORY_TRACT | Status: DC
  Administered 2020-06-25 – 2020-06-26 (×3): 0.63 mg via RESPIRATORY_TRACT
  Filled 2020-06-25 (×4): qty 3

## 2020-06-25 MED ORDER — ARTIFICIAL TEARS OPHTHALMIC OINT
TOPICAL_OINTMENT | OPHTHALMIC | Status: DC | PRN
  Administered 2020-06-25: 1 via OPHTHALMIC

## 2020-06-25 MED ORDER — MIDAZOLAM HCL 5 MG/5ML IJ SOLN
INTRAMUSCULAR | Status: DC | PRN
  Administered 2020-06-25: 2 mg via INTRAVENOUS
  Administered 2020-06-25: 1 mg via INTRAVENOUS
  Administered 2020-06-25 (×2): 2 mg via INTRAVENOUS

## 2020-06-25 MED ORDER — DOCUSATE SODIUM 100 MG PO CAPS
200.0000 mg | ORAL_CAPSULE | Freq: Every day | ORAL | Status: DC
  Administered 2020-06-26 – 2020-07-03 (×6): 200 mg via ORAL
  Filled 2020-06-25 (×7): qty 2

## 2020-06-25 MED ORDER — SODIUM CHLORIDE 0.9% FLUSH
3.0000 mL | Freq: Two times a day (BID) | INTRAVENOUS | Status: DC
  Administered 2020-06-26: 3 mL via INTRAVENOUS

## 2020-06-25 MED ORDER — ALBUMIN HUMAN 5 % IV SOLN
250.0000 mL | INTRAVENOUS | Status: AC | PRN
  Administered 2020-06-25 (×4): 12.5 g via INTRAVENOUS
  Filled 2020-06-25 (×2): qty 250

## 2020-06-25 SURGICAL SUPPLY — 104 items
ADAPTER CARDIO PERF ANTE/RETRO (ADAPTER) ×3 IMPLANT
ADH SKN CLS APL DERMABOND .7 (GAUZE/BANDAGES/DRESSINGS)
ADPR PRFSN 84XANTGRD RTRGD (ADAPTER) ×2
APL SRG 7X2 LUM MLBL SLNT (VASCULAR PRODUCTS)
APPLICATOR TIP COSEAL (VASCULAR PRODUCTS) IMPLANT
BAG DECANTER FOR FLEXI CONT (MISCELLANEOUS) ×3 IMPLANT
BINDER BREAST XLRG (GAUZE/BANDAGES/DRESSINGS) ×1 IMPLANT
BLADE CLIPPER SURG (BLADE) ×3 IMPLANT
BLADE STERNUM SYSTEM 6 (BLADE) ×3 IMPLANT
BNDG CMPR MED 15X6 ELC VLCR LF (GAUZE/BANDAGES/DRESSINGS) ×2
BNDG ELASTIC 4X5.8 VLCR STR LF (GAUZE/BANDAGES/DRESSINGS) ×3 IMPLANT
BNDG ELASTIC 6X15 VLCR STRL LF (GAUZE/BANDAGES/DRESSINGS) ×1 IMPLANT
BNDG ELASTIC 6X5.8 VLCR STR LF (GAUZE/BANDAGES/DRESSINGS) ×3 IMPLANT
BNDG GAUZE ELAST 4 BULKY (GAUZE/BANDAGES/DRESSINGS) ×3 IMPLANT
CANISTER SUCT 3000ML PPV (MISCELLANEOUS) ×3 IMPLANT
CANNULA NON VENT 18FR 12 (CANNULA) ×1 IMPLANT
CANNULA NON VENT 20FR 12 (CANNULA) ×1 IMPLANT
CATH CPB KIT HENDRICKSON (MISCELLANEOUS) ×3 IMPLANT
CATH ROBINSON RED A/P 18FR (CATHETERS) ×6 IMPLANT
CLIP RETRACTION 3.0MM CORONARY (MISCELLANEOUS) ×3 IMPLANT
CLIP VESOCCLUDE SM WIDE 24/CT (CLIP) ×9 IMPLANT
CONTAINER PROTECT SURGISLUSH (MISCELLANEOUS) ×3 IMPLANT
COVER PROBE W GEL 5X96 (DRAPES) ×1 IMPLANT
DERMABOND ADVANCED (GAUZE/BANDAGES/DRESSINGS)
DERMABOND ADVANCED .7 DNX12 (GAUZE/BANDAGES/DRESSINGS) ×2 IMPLANT
DRAIN CHANNEL 28F RND 3/8 FF (WOUND CARE) ×9 IMPLANT
DRAPE CARDIOVASCULAR INCISE (DRAPES) ×3
DRAPE SRG 135X102X78XABS (DRAPES) ×2 IMPLANT
DRAPE WARM FLUID 44X44 (DRAPES) ×3 IMPLANT
DRSG AQUACEL AG ADV 3.5X14 (GAUZE/BANDAGES/DRESSINGS) ×3 IMPLANT
ELECT CAUTERY BLADE 6.4 (BLADE) ×3 IMPLANT
ELECT REM PT RETURN 9FT ADLT (ELECTROSURGICAL) ×6
ELECTRODE REM PT RTRN 9FT ADLT (ELECTROSURGICAL) ×4 IMPLANT
FELT TEFLON 1X6 (MISCELLANEOUS) ×6 IMPLANT
GAUZE SPONGE 4X4 12PLY STRL (GAUZE/BANDAGES/DRESSINGS) ×6 IMPLANT
GLOVE NEODERM STRL 7.5  LF PF (GLOVE) ×6
GLOVE NEODERM STRL 7.5 LF PF (GLOVE) ×6 IMPLANT
GLOVE SURG NEODERM 7.5  LF PF (GLOVE) ×3
GOWN STRL REUS W/ TWL LRG LVL3 (GOWN DISPOSABLE) ×8 IMPLANT
GOWN STRL REUS W/TWL LRG LVL3 (GOWN DISPOSABLE) ×12
HEMOSTAT POWDER SURGIFOAM 1G (HEMOSTASIS) ×6 IMPLANT
INSERT CLAMP STEALTH SZ3 (MISCELLANEOUS) ×3
INSERT FOGARTY XLG (MISCELLANEOUS) ×3 IMPLANT
INSERT SUTURE HOLDER (MISCELLANEOUS) ×3 IMPLANT
KIT APPLICATOR RATIO 11:1 (KITS) ×1 IMPLANT
KIT BASIN OR (CUSTOM PROCEDURE TRAY) ×3 IMPLANT
KIT SUCTION CATH 14FR (SUCTIONS) ×3 IMPLANT
KIT TURNOVER KIT B (KITS) ×3 IMPLANT
KIT VASOVIEW HEMOPRO 2 VH 4000 (KITS) ×3 IMPLANT
MARKER GRAFT CORONARY BYPASS (MISCELLANEOUS) ×9 IMPLANT
NDL 18GX1X1/2 (RX/OR ONLY) (NEEDLE) ×2 IMPLANT
NEEDLE 18GX1X1/2 (RX/OR ONLY) (NEEDLE) ×3 IMPLANT
NS IRRIG 1000ML POUR BTL (IV SOLUTION) ×15 IMPLANT
PACK E OPEN HEART (SUTURE) ×3 IMPLANT
PACK OPEN HEART (CUSTOM PROCEDURE TRAY) ×3 IMPLANT
PACK PLATELET PROCEDURE 60 (MISCELLANEOUS) ×1 IMPLANT
PACK SPY-PHI (KITS) ×1 IMPLANT
PAD ARMBOARD 7.5X6 YLW CONV (MISCELLANEOUS) ×6 IMPLANT
PAD ELECT DEFIB RADIOL ZOLL (MISCELLANEOUS) ×3 IMPLANT
PENCIL BUTTON HOLSTER BLD 10FT (ELECTRODE) ×3 IMPLANT
POSITIONER HEAD DONUT 9IN (MISCELLANEOUS) ×3 IMPLANT
POWDER SURGICEL 3.0 GRAM (HEMOSTASIS) ×4 IMPLANT
PUNCH AORTIC ROTATE 4.5MM 8IN (MISCELLANEOUS) ×1 IMPLANT
SAVER CELL AUTOCOAG (MISCELLANEOUS) IMPLANT
SEALANT SURG COSEAL 4ML (VASCULAR PRODUCTS) ×3 IMPLANT
SEALANT SURG COSEAL 8ML (VASCULAR PRODUCTS) IMPLANT
SET CARDIOPLEGIA MPS 5001102 (MISCELLANEOUS) ×1 IMPLANT
SUT BONE WAX W31G (SUTURE) ×3 IMPLANT
SUT MNCRL AB 3-0 PS2 18 (SUTURE) ×6 IMPLANT
SUT MNCRL AB 4-0 PS2 18 (SUTURE) ×1 IMPLANT
SUT PDS AB 1 CTX 36 (SUTURE) ×7 IMPLANT
SUT PROLENE 3 0 SH DA (SUTURE) ×3 IMPLANT
SUT PROLENE 4 0 RB 1 (SUTURE) ×3
SUT PROLENE 4 0 SH DA (SUTURE) ×1 IMPLANT
SUT PROLENE 4-0 RB1 .5 CRCL 36 (SUTURE) IMPLANT
SUT PROLENE 5 0 C 1 36 (SUTURE) IMPLANT
SUT PROLENE 6 0 C 1 30 (SUTURE) ×9 IMPLANT
SUT PROLENE 7 0 BV 1 (SUTURE) ×2 IMPLANT
SUT PROLENE 8 0 BV175 6 (SUTURE) IMPLANT
SUT PROLENE BLUE 7 0 (SUTURE) ×3 IMPLANT
SUT SILK 2 0 SH CR/8 (SUTURE) IMPLANT
SUT SILK 3 0 SH CR/8 (SUTURE) IMPLANT
SUT STEEL 6MS V (SUTURE) ×3 IMPLANT
SUT STEEL SZ 6 DBL 3X14 BALL (SUTURE) ×3 IMPLANT
SUT VIC AB 1 CTX 36 (SUTURE) ×3
SUT VIC AB 1 CTX36XBRD ANBCTR (SUTURE) IMPLANT
SUT VIC AB 2-0 CT1 27 (SUTURE) ×3
SUT VIC AB 2-0 CT1 TAPERPNT 27 (SUTURE) IMPLANT
SUT VIC AB 2-0 CTX 27 (SUTURE) IMPLANT
SUT VIC AB 3-0 X1 27 (SUTURE) IMPLANT
SYR 10ML LL (SYRINGE) IMPLANT
SYR 30ML LL (SYRINGE) ×3 IMPLANT
SYR 3ML LL SCALE MARK (SYRINGE) ×3 IMPLANT
SYSTEM SAHARA CHEST DRAIN ATS (WOUND CARE) ×3 IMPLANT
TAPE CLOTH SURG 4X10 WHT LF (GAUZE/BANDAGES/DRESSINGS) ×1 IMPLANT
TAPE PAPER 2X10 WHT MICROPORE (GAUZE/BANDAGES/DRESSINGS) ×1 IMPLANT
TIP DUAL SPRAY TOPICAL (TIP) ×1 IMPLANT
TOWEL GREEN STERILE (TOWEL DISPOSABLE) ×3 IMPLANT
TOWEL GREEN STERILE FF (TOWEL DISPOSABLE) ×3 IMPLANT
TRAY FOLEY SLVR 16FR TEMP STAT (SET/KITS/TRAYS/PACK) ×3 IMPLANT
TUBING LAP HI FLOW INSUFFLATIO (TUBING) ×3 IMPLANT
UNDERPAD 30X36 HEAVY ABSORB (UNDERPADS AND DIAPERS) ×3 IMPLANT
WATER STERILE IRR 1000ML POUR (IV SOLUTION) ×6 IMPLANT
WATER STERILE IRR 1000ML UROMA (IV SOLUTION) IMPLANT

## 2020-06-25 NOTE — Transfer of Care (Signed)
Immediate Anesthesia Transfer of Care Note  Patient: Leah Ferrell  Procedure(s) Performed: CORONARY ARTERY BYPASS GRAFTING (CABG)X3. LEFT INTERNAL MAMMARY ARTERY. RIGHT ENDOSCOPIC SAPHENOUS VEIN HARVESTING. (N/A Chest) TRANSESOPHAGEAL ECHOCARDIOGRAM (TEE) (N/A ) INDOCYANINE GREEN FLUORESCENCE IMAGING (ICG) (N/A )  Patient Location: ICU  Anesthesia Type:General  Level of Consciousness: sedated  Airway & Oxygen Therapy: Patient remains intubated per anesthesia plan and Patient placed on Ventilator (see vital sign flow sheet for setting)  Post-op Assessment: Report given to RN and Post -op Vital signs reviewed and stable  Post vital signs: Reviewed and stable  Last Vitals:  Vitals Value Taken Time  BP    Temp    Pulse 92 06/25/20 1228  Resp 25 06/25/20 1228  SpO2 100 % 06/25/20 1228  Vitals shown include unvalidated device data.  Last Pain:  Vitals:   06/25/20 0620  TempSrc:   PainSc: 0-No pain      Patients Stated Pain Goal: 4 (30/07/62 2633)  Complications: No complications documented.

## 2020-06-25 NOTE — H&P (Signed)
History and Physical Interval Note:  06/25/2020 7:18 AM  Leah Ferrell  has presented today for surgery, with the diagnosis of CAD.  The various methods of treatment have been discussed with the patient and family. After consideration of risks, benefits and other options for treatment, the patient has consented to  Procedure(s): CORONARY ARTERY BYPASS GRAFTING (CABG) (N/A) TRANSESOPHAGEAL ECHOCARDIOGRAM (TEE) (N/A) INDOCYANINE GREEN FLUORESCENCE IMAGING (ICG) (N/A) as a surgical intervention.  The patient's history has been reviewed, patient examined, no change in status, stable for surgery.  I have reviewed the patient's chart and labs.  Questions were answered to the patient's satisfaction.     [id number]       [place] [id number]             [phone]     CARDIOTHORACIC SURGERY office visitation  Referring Provider is No ref. provider found Primary Cardiologist is [name], MD PCP is Sharion Balloon, FNP  No chief complaint on file.   HPI:  70 year old lady presents for outpatient consideration of CABG.  She has extensive cardiovascular and peripheral vascular calcific disease.  She has undergone multiple stenting procedures most recently bilateral iliac stenting.  She is also undergone previous PCI but upon further evaluation was noted to have exertional chest pain and shortness of breath.  Repeat catheterization and March 2022 demonstrates reasonably patent RCA stent but ends did not restenosis in the LAD and proximal disease of the circumflex vessel as well.  She has preserved LV function.  She denies atrial fibrillation or strokes.  She has no symptoms of heart failure.  Past Medical History:  Diagnosis Date  . CAD (coronary artery disease)    a. 04/2012 NSTEMI: s/p DES to LAD.  Marland Kitchen Chronic diastolic CHF (congestive heart failure) ([facility])   . Diabetes mellitus without complication ([facility])   . Dysrhythmia   . Environmental  and seasonal allergies   . Hyperlipidemia   . Hypertension   . Leukocytosis   . Morbid obesity ([facility])   . NSTEMI (non-ST elevated myocardial infarction) ([facility]) 04/27/2012  . PONV (postoperative nausea and vomiting)   . PVD (peripheral vascular disease) ([place])    a. 04/2012 ABI: R 0.49, L 0.39. Angiography 06/14: Significant ostial left common iliac artery stenosis extending into the distal aorta, severe left common femoral artery stenosis, bilateral SFA occlusion with heavy calcifications. Functionally, one-vessel runoff bilaterally below the knee  . Renal artery stenosis ([facility])    s/p angioplasty/stenting bilateral renal arteries 03/16/19  . Shortness of breath     Past Surgical History:  Procedure Laterality Date  . ABDOMINAL AORTAGRAM N/A 07/21/2012   Procedure: ABDOMINAL Maxcine Ham;  Surgeon: Wellington Hampshire, MD;  Location: [facility] CATH LAB;  Service: Cardiovascular;  Laterality: N/A;  . ABDOMINAL AORTOGRAM W/LOWER EXTREMITY N/A 05/09/2020   Procedure: ABDOMINAL AORTOGRAM W/LOWER EXTREMITY;  Surgeon: Wellington Hampshire, MD;  Location: [facility] CV LAB;  Service: Cardiovascular;  Laterality: N/A;  . CARDIAC CATHETERIZATION     stents X 2  . CORONARY ANGIOGRAM  04/27/2012   Procedure: CORONARY ANGIOGRAM;  Surgeon: Thayer Headings, MD;  Location: [facility] CATH LAB;  Service: Cardiovascular;;  . ENDARTERECTOMY FEMORAL Left 11/02/2013   Procedure: RESECTION LEFT COMMON FEMORAL ARTERY AND INSERTION OF INTERPOSITION 37mm HEMASHIELD GRAFT FROM LEFT EXTERNAL  ILIAC ARTERY TO LEFT PROFUNDA  FEMORIS ARTERY;  Surgeon: Mal Misty, MD;  Location: [facility];  Service: Vascular;  Laterality: Left;  . EYE SURGERY Right 2017  . FRACTURE SURGERY Right Jul 07, 2014   Right Foot-  [place]  by Heat duct  . ILIAC ARTERY STENT Left 08/31/2013  . LAD stent    . LEFT HEART CATH AND CORONARY ANGIOGRAPHY N/A 05/09/2020   Procedure: LEFT HEART CATH AND CORONARY ANGIOGRAPHY;  Surgeon: Wellington Hampshire, MD;  Location: [facility] CV LAB;  Service: Cardiovascular;  Laterality: N/A;  . LEFT HEART CATHETERIZATION WITH CORONARY ANGIOGRAM N/A 04/23/2012   Procedure: LEFT HEART CATHETERIZATION WITH CORONARY ANGIOGRAM;  Surgeon: Peter M Martinique, MD;  Location: [facility] CATH LAB;  Service: Cardiovascular;  Laterality: N/A;  . LOWER EXTREMITY ANGIOGRAM Left 08/31/2013   Procedure: LOWER EXTREMITY ANGIOGRAM;  Surgeon: Wellington Hampshire, MD;  Location: [facility] CATH LAB;  Service: Cardiovascular;  Laterality: Left;  . PERCUTANEOUS STENT INTERVENTION Left 08/31/2013   Procedure: PERCUTANEOUS STENT INTERVENTION;  Surgeon: Wellington Hampshire, MD;  Location: [facility] CATH LAB;  Service: Cardiovascular;  Laterality: Left;  Common Iliac artery  . PERIPHERAL VASCULAR CATHETERIZATION N/A 03/19/2016   Procedure: Abdominal Aortogram w/Lower Extremity;  Surgeon: Wellington Hampshire, MD;  Location: [facility];  Service: Cardiovascular;  Laterality: N/A;  . PERIPHERAL VASCULAR CATHETERIZATION  03/19/2016   Procedure: Peripheral Vascular Balloon Angioplasty-CFA Left;  Surgeon: Wellington Hampshire, MD;  Location: [facility] CV LAB;  Service: Cardiovascular;;  . PERIPHERAL VASCULAR INTERVENTION Bilateral 03/16/2019   Procedure: PERIPHERAL VASCULAR INTERVENTION;  Surgeon: Wellington Hampshire, MD;  Location: [facility] CV LAB;  Service: Cardiovascular;  Laterality: Bilateral;  RENAL  . PERIPHERAL VASCULAR INTERVENTION Bilateral 05/09/2020   Procedure: PERIPHERAL VASCULAR INTERVENTION;  Surgeon: Wellington Hampshire, MD;  Location: [facility] CV LAB;  Service: Cardiovascular;  Laterality: Bilateral;  Iliac  . RENAL ANGIOGRAPHY  03/16/2019  . RENAL ANGIOGRAPHY Bilateral 03/16/2019   Procedure: RENAL ANGIOGRAPHY;  Surgeon: Wellington Hampshire, MD;  Location: [facility] CV LAB;  Service: Cardiovascular;  Laterality: Bilateral;  . TUMOR REMOVAL     tumor removed from [place]    Family History  Problem Relation Age of Onset  . Diabetes Mother   . Hyperlipidemia Mother   .  Hypertension Mother   . Peripheral vascular disease Mother   . Diabetes Maternal Grandmother   . Heart attack Maternal Grandfather   . Heart disease Brother   . Other Sister        drowned  . Diabetes Brother   . Alcohol abuse Brother   . Other Sister        drowned  . Diabetes Daughter        borderline  . Hypertension Daughter   . Diabetes Son   . Hypertension Son   . Hyperlipidemia Son     Social History   Socioeconomic History  . Marital status: Widowed    Spouse name: Not on file  . Number of children: 4  . Years of education: completed school in [place]   . Highest education level: Not on file  Occupational History  . Occupation: retired  Tobacco Use  . Smoking status: Former Smoker    Packs/day: 1.00    Years: 36.00    Pack years: 36.00    Types: Cigarettes    Start date: 04/21/1964    Quit date: 02/17/2001    Years since quitting: 19.3  . Smokeless tobacco: Never Used  Vaping Use  . Vaping Use: Never used  Substance and Sexual Activity  . Alcohol  use: No    Alcohol/week: 0.0 standard drinks  . Drug use: No  . Sexual activity: Not Currently    Birth control/protection: Post-menopausal  Other Topics Concern  . Not on file  Social History Narrative  . Not on file   Social Determinants of Health   Financial Resource Strain: Not on file  Food Insecurity: Not on file  Transportation Needs: Not on file  Physical Activity: Not on file  Stress: Not on file  Social Connections: Not on file  Intimate Partner Violence: Not on file    Current Facility-Administered Medications  Medication Dose Route Frequency Provider Last Rate Last Admin  . ceFAZolin (ANCEF) IVPB 2g/100 mL premix  2 g Intravenous To OR Brynnlee Cumpian, Glenice Bow, MD      . ceFAZolin (ANCEF) IVPB 2g/100 mL premix  2 g Intravenous To OR Penina Reisner, Glenice Bow, MD      . chlorhexidine (HIBICLENS) 4 % liquid 2 application  30 mL Topical UD Hymie Gorr Z, MD      . dexmedetomidine (PRECEDEX) 400 MCG/100ML  (4 mcg/mL) infusion  0.1-0.7 mcg/kg/hr Intravenous To OR Kathia Covington, Glenice Bow, MD      . EPINEPHrine (ADRENALIN) 4 mg in NS 250 mL (0.016 mg/mL) premix infusion  0-10 mcg/min Intravenous To OR Talita Recht Z, MD      . heparin 30,000 units/NS 1000 mL solution for CELLSAVER   Other To OR Blue Ruggerio, Glenice Bow, MD      . heparin sodium (porcine) 2,500 Units, papaverine 30 mg in electrolyte-148 (PLASMALYTE-148) 500 mL irrigation   Irrigation To OR Amaryah Mallen Z, MD      . insulin regular, human (MYXREDLIN) 100 units/ 100 mL infusion   Intravenous To OR Braylee Lal Z, MD      . magnesium sulfate (IV Push/IM) injection 40 mEq  40 mEq Other To OR Heidemarie Goodnow, Glenice Bow, MD      . metoprolol tartrate (LOPRESSOR) tablet 12.5 mg  12.5 mg Oral Once Pedro Whiters, Glenice Bow, MD      . milrinone (PRIMACOR) 20 MG/100 ML (0.2 mg/mL) infusion  0.3 mcg/kg/min Intravenous To OR Karlo Goeden Z, MD      . nitroGLYCERIN 50 mg in dextrose 5 % 250 mL (0.2 mg/mL) infusion  2-200 mcg/min Intravenous To OR Alexya Mcdaris, Glenice Bow, MD      . norepinephrine (LEVOPHED) 4mg  in 28mL premix infusion  0-40 mcg/min Intravenous To OR Laquon Emel Z, MD      . phenylephrine (NEOSYNEPHRINE) 20-0.9 MG/250ML-% infusion  30-200 mcg/min Intravenous To OR Vasilia Dise Z, MD      . potassium chloride injection 80 mEq  80 mEq Other To OR Rayah Fines, Glenice Bow, MD      . tranexamic acid (CYKLOKAPRON) 2,500 mg in sodium chloride 0.9 % 250 mL (10 mg/mL) infusion  1.5 mg/kg/hr Intravenous To OR Trea Latner Z, MD      . tranexamic acid (CYKLOKAPRON) bolus via infusion - over 30 minutes 1,390.5 mg  15 mg/kg Intravenous To OR Corri Delapaz Z, MD      . tranexamic acid (CYKLOKAPRON) pump prime solution 185 mg  2 mg/kg Intracatheter To OR Makynli Stills Z, MD      . vancomycin (VANCOREADY) IVPB 1500 mg/300 mL  1,500 mg Intravenous To OR Naturi Alarid, Glenice Bow, MD       Facility-Administered Medications Ordered in Other Encounters  Medication Dose Route  Frequency Provider Last Rate Last Admin  . fentaNYL citrate (PF) (SUBLIMAZE) injection   Intravenous Anesthesia  Reino Bellis, CRNA   50 mcg at 06/25/20 [phone]  . lactated ringers infusion   Intravenous Continuous PRN Imagene Riches, CRNA   New Bag at 06/25/20 [phone]  . lactated ringers infusion   Intravenous Continuous PRN Imagene Riches, CRNA   New Bag at 06/25/20 [phone]  . lactated ringers infusion   Intravenous Continuous PRN Imagene Riches, CRNA   New Bag at 06/25/20 [phone]  . midazolam (VERSED) 5 MG/5ML injection   Intravenous Anesthesia Intra-op Imagene Riches, CRNA   1 mg at 06/25/20 [id number]    Allergies  Allergen Reactions  . Tape Rash      Review of Systems:   General:  No weight change.  No constitutional symptoms  Cardiac:  As per HPI; no atrial fibrillation  Respiratory:  Denies cough or sleep apnea  GI:   No abdominal pain or bleeding  GU:   No kidney stones or bladder disease  Vascular:  As per HPI  Neuro:   No history of strokes TIAs or seizures  Musculoskeletal: No arthritis  Skin:   Negative  Psych:   Negative  Eyes:   Negative  ENT:   Does wear dentures  Hematologic:  Anticoagulated with antiplatelet medications  Endocrine:  History of diabetes     Physical Exam:   BP (!) 174/47   Pulse 78   Temp 98 F (36.7 C) (Oral)   Resp 18   Ht 5' 2.5" (1.588 m)   Wt 92.7 kg   SpO2 97%   BMI 36.78 kg/m   General:   well-appearing  HEENT:  Unremarkable   Neck:   no JVD, no bruits, no adenopathy   Chest:   clear to auscultation, symmetrical breath sounds, no wheezes, no rhonchi   CV:   RRR, no detectable murmur   Abdomen:  soft, non-tender, no masses  Extremities:  warm, well-perfused, pulses diminished in the feet, no LE edema  Rectal/GU  Deferred  Neuro:   Grossly non-focal and symmetrical throughout  Skin:   Clean and dry, no rashes, no breakdown   Diagnostic Tests:  I have personally reviewed her available imaging studies including left heart  catheterization from 05/09/2020 which demonstrates severe proximal circumflex and LAD disease.  She has preserved LV function   Impression:  70 year old lady with extensive cardiovascular atherosclerotic disease.  She has previously undergone multiple stenting procedures including coronary stenting.  She now has recurrent disease in the left coronary system.  She appears to be a good candidate for surgical coronary revascularization.    Plan:  CABG on 06/25/2020 Stop Plavix on 06/20/2020; hold lisinopril after 06/22/2020 Routine preoperative work-up   I spent in excess of 30 minutes during the conduct of this office consultation and >50% of this time involved direct face-to-face encounter with the patient for counseling and/or coordination of their care.          Level 3 Office Consult = 40 minutes         Level 4 Office Consult = 60 minutes         Level 5 Office Consult = 80 minutes  B.  Murvin Natal, MD 06/25/2020 7:19 AM

## 2020-06-25 NOTE — Anesthesia Procedure Notes (Signed)
Procedure Name: Intubation Date/Time: 06/25/2020 7:52 AM Performed by: Imagene Riches, CRNA Pre-anesthesia Checklist: Patient identified, Emergency Drugs available, Suction available and Patient being monitored Patient Re-evaluated:Patient Re-evaluated prior to induction Oxygen Delivery Method: Circle System Utilized Preoxygenation: Pre-oxygenation with 100% oxygen Induction Type: IV induction Ventilation: Mask ventilation without difficulty and Oral airway inserted - appropriate to patient size Laryngoscope Size: Sabra Heck and 2 Grade View: Grade I Tube type: Oral Tube size: 8.0 mm Number of attempts: 1 Airway Equipment and Method: Stylet and Oral airway Placement Confirmation: ETT inserted through vocal cords under direct vision,  positive ETCO2 and breath sounds checked- equal and bilateral Secured at: 22 cm Tube secured with: Tape Dental Injury: Teeth and Oropharynx as per pre-operative assessment

## 2020-06-25 NOTE — Anesthesia Procedure Notes (Signed)
Arterial Line Insertion Start/End5/10/2020 6:45 AM, 06/25/2020 6:55 AM Performed by: Imagene Riches, CRNA, CRNA  Patient location: Pre-op. Preanesthetic checklist: patient identified, IV checked, site marked, risks and benefits discussed, surgical consent, monitors and equipment checked, pre-op evaluation, timeout performed and anesthesia consent Patient sedated Left, radial was placed Catheter size: 20 G Hand hygiene performed  and maximum sterile barriers used  Allen's test indicative of satisfactory collateral circulation Attempts: 2 Procedure performed without using ultrasound guided technique. Following insertion, dressing applied and Biopatch. Post procedure assessment: normal  Patient tolerated the procedure well with no immediate complications.

## 2020-06-25 NOTE — Brief Op Note (Signed)
06/25/2020  10:50 AM  PATIENT:  Leah Ferrell  70 y.o. female  PRE-OPERATIVE DIAGNOSIS:  CAD  POST-OPERATIVE DIAGNOSIS:  CAD  PROCEDURE:  Procedure(s): CORONARY ARTERY BYPASS GRAFTING (CABG)X3. LEFT INTERNAL MAMMARY ARTERY. RIGHT ENDOSCOPIC SAPHENOUS VEIN HARVESTING. (N/A) TRANSESOPHAGEAL ECHOCARDIOGRAM (TEE) (N/A) INDOCYANINE GREEN FLUORESCENCE IMAGING (ICG) (N/A)  Epiaortic ultrasonography LIMA-LAD SVG-OM SVG-DIAG EVH 60 MIN  SURGEON:  Surgeon(s) and Role:    * Nour Rodrigues, Glenice Bow, MD - Primary  PHYSICIAN ASSISTANT: WAYNE GOLD PA-C  ASSISTANTS: STAFF   ANESTHESIA:   general  BLOOD ADMINISTERED:none  DRAINS: MEDIASTINAL AND PLEURAL CHEST DRAINS   LOCAL MEDICATIONS USED:  EXPAREL  SPECIMEN:  No Specimen  DISPOSITION OF SPECIMEN:  N/A  COUNTS:  YES  TOURNIQUET:  * No tourniquets in log *  DICTATION: .Dragon Dictation  PLAN OF CARE: Admit to inpatient   PATIENT DISPOSITION:  ICU - intubated and hemodynamically stable.   Delay start of Pharmacological VTE agent (>24hrs) due to surgical blood loss or risk of bleeding: yes  COMPLICATIONS: NO KNOWN

## 2020-06-25 NOTE — Anesthesia Procedure Notes (Signed)
Anesthesia Procedure Image    

## 2020-06-25 NOTE — Progress Notes (Signed)
  Echocardiogram Echocardiogram Transesophageal has been performed.  Leah Ferrell 06/25/2020, 9:47 AM

## 2020-06-25 NOTE — Procedures (Signed)
Extubation Procedure Note  Patient Details:   Name: Leah Ferrell DOB: [birth year] MRN: [id number]   Airway Documentation:    Vent end date: 06/25/20 Vent end time: 1735   Evaluation  O2 sats: stable throughout Complications: No apparent complications Patient did tolerate procedure well. Bilateral Breath Sounds: Clear,Diminished   Yes   Pt extubated to 4L [place]. No stridor heard, positive cuff leak noted. Pt performed good effort with her incentive. RT will continue to monitor. NIF -[place] 06/25/2020, 5:42 PM

## 2020-06-25 NOTE — Anesthesia Postprocedure Evaluation (Signed)
Anesthesia Post Note  Patient: Leah Ferrell  Procedure(s) Performed: CORONARY ARTERY BYPASS GRAFTING (CABG)X3. LEFT INTERNAL MAMMARY ARTERY. RIGHT ENDOSCOPIC SAPHENOUS VEIN HARVESTING. (N/A Chest) TRANSESOPHAGEAL ECHOCARDIOGRAM (TEE) (N/A ) INDOCYANINE GREEN FLUORESCENCE IMAGING (ICG) (N/A )     Patient location during evaluation: PACU Anesthesia Type: General Level of consciousness: awake and alert Pain management: pain level controlled Vital Signs Assessment: post-procedure vital signs reviewed and stable Respiratory status: spontaneous breathing, nonlabored ventilation, respiratory function stable and patient connected to nasal cannula oxygen Cardiovascular status: blood pressure returned to baseline and stable Postop Assessment: no apparent nausea or vomiting Anesthetic complications: no   No complications documented.  Last Vitals:  Vitals:   06/25/20 1600 06/25/20 1640  BP: (!) 98/57 (!) 127/49  Pulse: 92 92  Resp: 14 18  Temp: 36.5 C   SpO2: 100% 100%    Last Pain:  Vitals:   06/25/20 1300  TempSrc: Core  PainSc:                  Leah Ferrell S

## 2020-06-25 NOTE — Anesthesia Procedure Notes (Signed)
Central Venous Catheter Insertion Performed by: Myrtie Soman, MD, anesthesiologist Start/End5/10/2020 6:55 AM, 06/25/2020 7:16 AM Patient location: Pre-op. Preanesthetic checklist: patient identified, IV checked, site marked, risks and benefits discussed, surgical consent, monitors and equipment checked, pre-op evaluation, timeout performed and anesthesia consent Position: Trendelenburg Lidocaine 1% used for infiltration and patient sedated Hand hygiene performed  and maximum sterile barriers used  Catheter size: 8.5 Fr PA cath was placed.Sheath introducer Swan type:thermodilution PA Cath depth:40 Procedure performed without using ultrasound guided technique. Ultrasound Notes:anatomy identified, needle tip was noted to be adjacent to the nerve/plexus identified, no ultrasound evidence of intravascular and/or intraneural injection and image(s) printed for medical record Attempts: 1 Following insertion, line sutured, dressing applied and Biopatch. Post procedure assessment: blood return through all ports, free fluid flow and no air  Patient tolerated the procedure well with no immediate complications.

## 2020-06-25 NOTE — Op Note (Signed)
CARDIOTHORACIC SURGERY OPERATIVE NOTE  Date of Procedure: 06/25/2020  Preoperative Diagnosis: Severe 3-vessel Coronary Artery Disease  Postoperative Diagnosis: Same  Procedure:    Coronary Artery Bypass Grafting x 3   Left Internal Mammary Artery to Distal Left Anterior Descending Coronary Artery; Saphenous Vein Graft to first obtuse Marginal Branch of Left Circumflex Coronary Artery, Sapheonous Vein Graft to  Diagonal Branch Coronary Artery, Endoscopic Vein Harvest from right thigh and Lower Leg Completion graft surveillance with indocyanine green fluorescence angiography Epiaortic ultrasonography  Surgeon: B.  Murvin Natal, MD  Assistant: Evonnie Pat, PA-C  Anesthesia: General  Operative Findings:  Preserved left ventricular systolic function  Good quality left internal mammary artery conduit  Good quality saphenous vein conduit  Good quality target vessels for grafting    BRIEF CLINICAL NOTE AND INDICATIONS FOR SURGERY  70 year old lady with extensive past medical history for peripheral vascular disease, coronary artery disease status post stenting, and hypertension presented with exertional shortness of breath and chest pain.  Given her history, she underwent left heart catheterization with these new symptoms.  This demonstrated severe multivessel coronary artery disease.  She was referred for CABG.  She has been thoroughly evaluated and considered a good candidate.  She has preserved LV function and no significant heart failure symptoms  DETAILS OF THE OPERATIVE PROCEDURE  Preparation:  The patient is brought to the operating room on the above mentioned date and central monitoring was established by the anesthesia team including placement of Swan-Ganz catheter and radial arterial line. The patient is placed in the supine position on the operating table.  Intravenous antibiotics are administered. General endotracheal anesthesia is induced uneventfully. A Foley catheter is  placed.  Baseline transesophageal echocardiogram was performed.  Findings were notable for LV hypertrophy and intact valvular function  The patient's chest, abdomen, both groins, and both lower extremities are prepared and draped in a sterile manner. A time out procedure is performed.   Surgical Approach and Conduit Harvest:  A median sternotomy incision was performed and the left internal mammary artery is dissected from the chest wall and prepared for bypass grafting. The left internal mammary artery is notably good quality conduit. Simultaneously, the greater saphenous vein is obtained from the patient's right thigh using endoscopic vein harvest technique. The saphenous vein is notably good quality conduit. After removal of the saphenous vein, the small surgical incisions in the lower extremity are closed with absorbable suture. Following systemic heparinization, the left internal mammary artery was transected distally noted to have excellent flow.   Extracorporeal Cardiopulmonary Bypass and Myocardial Protection:  The pericardium is opened. The ascending aorta is normal in appearance, but is calcified posteriorly.  This is confirmed with epiaortic ultrasonography.  There is a good area anteriorly for manipulation of the aorta including cannulation the ascending aorta and the right atrium are cannulated for cardiopulmonary bypass.  Adequate heparinization is verified.     The entire pre-bypass portion of the operation was notable for stable hemodynamics.  Cardiopulmonary bypass was begun and the surface of the heart is inspected. Distal target vessels are selected for coronary artery bypass grafting. A cardioplegia cannula is placed in the ascending aorta.    The patient is allowed to cool passively to 34C systemic temperature.  The aortic cross clamp is applied and cold blood cardioplegia is delivered initially in an antegrade fashion through the aortic root. Iced saline slush is applied for  topical hypothermia.  The initial cardioplegic arrest is rapid with early diastolic arrest.  Repeat doses of cardioplegia are administered intermittently throughout the entire cross clamp portion of the operation through the aortic root and through subsequently placed vein grafts in order to maintain completely flat electrocardiogram.   Coronary Artery Bypass Grafting:   The  obtuse marginal branch of the left circumflex coronary artery was grafted using a reversed saphenous vein graft in an end-to-side fashion.  At the site of distal anastomosis the target vessel was good quality and measured approximately 1.5 mm in diameter.  The diagonal branch of the left anterior descending coronary artery was grafted using a reversed saphenous vein graft in an end-to-side fashion.  At the site of distal anastomosis the target vessel was good quality and measured approximately 1.5 mm in diameter.  The distal left anterior coronary artery was grafted with the left internal mammary artery in an end-to-side fashion.  At the site of distal anastomosis the target vessel was good quality and measured approximately 1.5 mm in diameter. Anastomotic patency and runoff was confirmed with indocyanine green fluorescence imaging (SPY).  All proximal vein graft anastomoses were placed directly to the ascending aorta prior to removal of the aortic cross clamp.  The septal myocardial temperature rose rapidly after reperfusion of the left internal mammary artery graft.     Procedure Completion:  All proximal and distal coronary anastomoses were inspected for hemostasis and appropriate graft orientation. Epicardial pacing wires are fixed to the right ventricular outflow tract and to the right atrial appendage. The patient is rewarmed to 37C temperature. The patient is weaned and disconnected from cardiopulmonary bypass.  The patient's rhythm at separation from bypass was sinus bradycardia.  The patient was weaned from  cardiopulmonary bypass  without any inotropic support.  Followup transesophageal echocardiogram performed after separation from bypass revealed no changes from the preoperative exam.  The aortic and venous cannula were removed uneventfully. Protamine was administered to reverse the anticoagulation. The mediastinum and pleural space were inspected for hemostasis and irrigated with saline solution. The mediastinum and both pleural spaces were drained using fluted chest tubes placed through separate stab incisions inferiorly.  The soft tissues anterior to the aorta were reapproximated loosely. The sternum is closed with double strength sternal wire. The soft tissues anterior to the sternum were closed in multiple layers and the skin is closed with a running subcuticular skin closure.  The post-bypass portion of the operation was notable for stable rhythm and hemodynamics.  No blood products were administered during the operation.   Disposition:  The patient tolerated the procedure well and is transported to the surgical intensive care in stable condition. There are no intraoperative complications. All sponge instrument and needle counts are verified correct at completion of the operation.    Jayme Cloud, MD 06/25/2020 12:26 PM

## 2020-06-26 ENCOUNTER — Encounter (HOSPITAL_COMMUNITY): Payer: Self-pay | Admitting: Cardiothoracic Surgery

## 2020-06-26 ENCOUNTER — Inpatient Hospital Stay (HOSPITAL_COMMUNITY): Payer: Medicare Other

## 2020-06-26 LAB — CBC
HCT: 25.8 % — ABNORMAL LOW (ref 36.0–46.0)
HCT: 26.2 % — ABNORMAL LOW (ref 36.0–46.0)
Hemoglobin: 8.6 g/dL — ABNORMAL LOW (ref 12.0–15.0)
Hemoglobin: 8.6 g/dL — ABNORMAL LOW (ref 12.0–15.0)
MCH: 32.1 pg (ref 26.0–34.0)
MCH: 31.9 pg (ref 26.0–34.0)
MCHC: 33.3 g/dL (ref 30.0–36.0)
MCHC: 32.8 g/dL (ref 30.0–36.0)
MCV: 96.3 fL (ref 80.0–100.0)
MCV: 97 fL (ref 80.0–100.0)
Platelets: 151 10*3/uL (ref 150–400)
Platelets: 151 10*3/uL (ref 150–400)
RBC: 2.68 MIL/uL — ABNORMAL LOW (ref 3.87–5.11)
RBC: 2.7 MIL/uL — ABNORMAL LOW (ref 3.87–5.11)
RDW: 13.2 % (ref 11.5–15.5)
RDW: 13.5 % (ref 11.5–15.5)
WBC: 15 10*3/uL — ABNORMAL HIGH (ref 4.0–10.5)
WBC: 18.5 10*3/uL — ABNORMAL HIGH (ref 4.0–10.5)
nRBC: 0 % (ref 0.0–0.2)
nRBC: 0 % (ref 0.0–0.2)

## 2020-06-26 LAB — COOXEMETRY PANEL
Carboxyhemoglobin: 1 % (ref 0.5–1.5)
Carboxyhemoglobin: 1 % (ref 0.5–1.5)
Methemoglobin: 1 % (ref 0.0–1.5)
Methemoglobin: 1.2 % (ref 0.0–1.5)
O2 Saturation: 63.4 %
O2 Saturation: 53.2 %
Total hemoglobin: 9.1 g/dL — ABNORMAL LOW (ref 12.0–16.0)
Total hemoglobin: 8.2 g/dL — ABNORMAL LOW (ref 12.0–16.0)

## 2020-06-26 LAB — GLUCOSE, CAPILLARY
Glucose-Capillary: 101 mg/dL — ABNORMAL HIGH (ref 70–99)
Glucose-Capillary: 123 mg/dL — ABNORMAL HIGH (ref 70–99)
Glucose-Capillary: 129 mg/dL — ABNORMAL HIGH (ref 70–99)
Glucose-Capillary: 131 mg/dL — ABNORMAL HIGH (ref 70–99)
Glucose-Capillary: 131 mg/dL — ABNORMAL HIGH (ref 70–99)
Glucose-Capillary: 143 mg/dL — ABNORMAL HIGH (ref 70–99)
Glucose-Capillary: 144 mg/dL — ABNORMAL HIGH (ref 70–99)
Glucose-Capillary: 148 mg/dL — ABNORMAL HIGH (ref 70–99)
Glucose-Capillary: 153 mg/dL — ABNORMAL HIGH (ref 70–99)
Glucose-Capillary: 157 mg/dL — ABNORMAL HIGH (ref 70–99)
Glucose-Capillary: 157 mg/dL — ABNORMAL HIGH (ref 70–99)
Glucose-Capillary: 169 mg/dL — ABNORMAL HIGH (ref 70–99)
Glucose-Capillary: 171 mg/dL — ABNORMAL HIGH (ref 70–99)
Glucose-Capillary: 180 mg/dL — ABNORMAL HIGH (ref 70–99)
Glucose-Capillary: 184 mg/dL — ABNORMAL HIGH (ref 70–99)
Glucose-Capillary: 185 mg/dL — ABNORMAL HIGH (ref 70–99)
Glucose-Capillary: 208 mg/dL — ABNORMAL HIGH (ref 70–99)
Glucose-Capillary: 213 mg/dL — ABNORMAL HIGH (ref 70–99)
Glucose-Capillary: 229 mg/dL — ABNORMAL HIGH (ref 70–99)
Glucose-Capillary: 234 mg/dL — ABNORMAL HIGH (ref 70–99)
Glucose-Capillary: 70 mg/dL (ref 70–99)

## 2020-06-26 LAB — POCT I-STAT 7, (LYTES, BLD GAS, ICA,H+H)
Acid-base deficit: 1 mmol/L (ref 0.0–2.0)
Acid-base deficit: 1 mmol/L (ref 0.0–2.0)
Bicarbonate: 24.6 mmol/L (ref 20.0–28.0)
Bicarbonate: 24.2 mmol/L (ref 20.0–28.0)
Calcium, Ion: 1.12 mmol/L — ABNORMAL LOW (ref 1.15–1.40)
Calcium, Ion: 1.11 mmol/L — ABNORMAL LOW (ref 1.15–1.40)
HCT: 27 % — ABNORMAL LOW (ref 36.0–46.0)
HCT: 27 % — ABNORMAL LOW (ref 36.0–46.0)
Hemoglobin: 9.2 g/dL — ABNORMAL LOW (ref 12.0–15.0)
Hemoglobin: 9.2 g/dL — ABNORMAL LOW (ref 12.0–15.0)
O2 Saturation: 98 %
O2 Saturation: 97 %
Patient temperature: 37
Patient temperature: 37.2
Potassium: 4.4 mmol/L (ref 3.5–5.1)
Potassium: 4.3 mmol/L (ref 3.5–5.1)
Sodium: 136 mmol/L (ref 135–145)
Sodium: 137 mmol/L (ref 135–145)
TCO2: 26 mmol/L (ref 22–32)
TCO2: 25 mmol/L (ref 22–32)
pCO2 arterial: 42.4 mmHg (ref 32.0–48.0)
pCO2 arterial: 43.2 mmHg (ref 32.0–48.0)
pH, Arterial: 7.371 (ref 7.350–7.450)
pH, Arterial: 7.357 (ref 7.350–7.450)
pO2, Arterial: 110 mmHg — ABNORMAL HIGH (ref 83.0–108.0)
pO2, Arterial: 93 mmHg (ref 83.0–108.0)

## 2020-06-26 LAB — BASIC METABOLIC PANEL
Anion gap: 7 (ref 5–15)
Anion gap: 6 (ref 5–15)
BUN: 9 mg/dL (ref 8–23)
BUN: 10 mg/dL (ref 8–23)
CO2: 25 mmol/L (ref 22–32)
CO2: 25 mmol/L (ref 22–32)
Calcium: 7.6 mg/dL — ABNORMAL LOW (ref 8.9–10.3)
Calcium: 7.4 mg/dL — ABNORMAL LOW (ref 8.9–10.3)
Chloride: 101 mmol/L (ref 98–111)
Chloride: 98 mmol/L (ref 98–111)
Creatinine, Ser: 0.66 mg/dL (ref 0.44–1.00)
Creatinine, Ser: 0.72 mg/dL (ref 0.44–1.00)
GFR, Estimated: >60 mL/min (ref >60)
GFR, Estimated: >60 mL/min (ref >60)
Glucose, Bld: 121 mg/dL — ABNORMAL HIGH (ref 70–99)
Glucose, Bld: 122 mg/dL — ABNORMAL HIGH (ref 70–99)
Potassium: 3.8 mmol/L (ref 3.5–5.1)
Potassium: 3.9 mmol/L (ref 3.5–5.1)
Sodium: 133 mmol/L — ABNORMAL LOW (ref 135–145)
Sodium: 129 mmol/L — ABNORMAL LOW (ref 135–145)

## 2020-06-26 LAB — MAGNESIUM
Magnesium: 2 mg/dL (ref 1.7–2.4)
Magnesium: 2.2 mg/dL (ref 1.7–2.4)

## 2020-06-26 MED ORDER — THIAMINE HCL 100 MG/ML IJ SOLN
Freq: Once | INTRAVENOUS | Status: AC
  Filled 2020-06-26: qty 1000

## 2020-06-26 MED ORDER — NICARDIPINE HCL IN NACL 20-0.86 MG/200ML-% IV SOLN
3.0000 mg/h | INTRAVENOUS | Status: DC
  Administered 2020-06-26: 5 mg/h via INTRAVENOUS
  Administered 2020-06-27: 12.5 mg/h via INTRAVENOUS
  Administered 2020-06-27: 5 mg/h via INTRAVENOUS
  Administered 2020-06-27 (×2): 12.5 mg/h via INTRAVENOUS
  Filled 2020-06-26 (×8): qty 200

## 2020-06-26 MED ORDER — COLCHICINE 0.3 MG HALF TABLET
0.3000 mg | ORAL_TABLET | Freq: Two times a day (BID) | ORAL | Status: DC
  Administered 2020-06-26 – 2020-07-01 (×11): 0.3 mg via ORAL
  Filled 2020-06-26 (×12): qty 1

## 2020-06-26 MED ORDER — MILRINONE LACTATE IN DEXTROSE 20-5 MG/100ML-% IV SOLN: 0.1250 ug/kg/min | INTRAVENOUS | Status: DC

## 2020-06-26 MED ORDER — METOCLOPRAMIDE HCL 5 MG/ML IJ SOLN
5.0000 mg | Freq: Four times a day (QID) | INTRAMUSCULAR | Status: DC
  Administered 2020-06-26 – 2020-07-03 (×23): 5 mg via INTRAVENOUS
  Filled 2020-06-26 (×24): qty 2

## 2020-06-26 MED ORDER — ORAL CARE MOUTH RINSE
15.0000 mL | Freq: Two times a day (BID) | OROMUCOSAL | Status: DC
  Administered 2020-06-26 – 2020-07-02 (×13): 15 mL via OROMUCOSAL

## 2020-06-26 MED ORDER — KETOROLAC TROMETHAMINE 15 MG/ML IJ SOLN
7.5000 mg | Freq: Four times a day (QID) | INTRAMUSCULAR | Status: DC
  Administered 2020-06-26 – 2020-06-27 (×4): 7.5 mg via INTRAVENOUS
  Filled 2020-06-26 (×4): qty 1

## 2020-06-26 MED ORDER — LEVALBUTEROL HCL 0.63 MG/3ML IN NEBU
0.6300 mg | INHALATION_SOLUTION | Freq: Two times a day (BID) | RESPIRATORY_TRACT | Status: DC
  Administered 2020-06-26 – 2020-07-01 (×9): 0.63 mg via RESPIRATORY_TRACT
  Filled 2020-06-26 (×9): qty 3

## 2020-06-26 NOTE — Hospital Course (Addendum)
  Referring Provider is No ref. provider found Primary Cardiologist is [name], MD PCP is Sharion Balloon, FNP   History of present illness:  The patient is a 70 year old female who was referred to Dr. Orvan Seen for consideration of CABG.  She has an extensive history of cardiac and peripheral vascular disease.  She has undergone multiple stenting procedures with most recent being bilateral iliac artery stents.  She has also had previous DES to LAD in 2014.  She has also had a previous stent to the RCA.  She recently underwent cardiac catheterization in March 2022 and was found to have restenosis in the LAD as well as significant circumflex disease.  She was noted to have preserved LV function.  Dr. Orvan Seen evaluated the patient as an outpatient including all pertinent studies and recommended proceeding with elective CABG.  She was admitted this hospitalization for the procedure.     Hospital course:  The patient was admitted on 06/25/2020 and taken the operating room at which time she underwent CABG x3.  She tolerated the procedure well and was taken to the surgical intensive care unit in stable condition.  Postoperative hospital course:   The patient was extubated using standard post cardiac surgical protocols.  She is neurologically intact.  She did require some postoperative support including milrinone which was weaned over time.  All routine lines, monitors and drainage devices have been discontinued in the standard fashion.  She does have an expected acute blood loss anemia which has stabilized.  Renal function has remained within normal limits.  He is maintaining sinus rhythm.  Oxygen has been weaned and she maintains good saturations on room air.  Incisions are noted to be healing well without evidence of infection.  She has had some vaginal bleeding that quickly resolved of uncertain etiology.  She does note she will need follow-up with his as an outpatient with her primary and/or  gynecologist.  Blood sugars have been under good control and she will be gradually transition to home medications at discharge.  Physical and Occupational Therapy recommend home therapy and this is being arranged.  She is not felt to be a candidate at this time for CIR.

## 2020-06-26 NOTE — Progress Notes (Signed)
Inpatient Diabetes Program Recommendations  AACE/ADA: New Consensus Statement on Inpatient Glycemic Control (2015)  Target Ranges:  Prepandial:   less than 140 mg/dL      Peak postprandial:   less than 180 mg/dL (1-2 hours)      Critically ill patients:  140 - 180 mg/dL   Lab Results  Component Value Date   GLUCAP 213 (H) 06/26/2020   HGBA1C 7.7 (H) 06/21/2020    Review of Glycemic Control Results for Leah Ferrell, Leah Ferrell (MRN [id number]) as of 06/26/2020 11:25  Ref. Range 06/26/2020 02:31 06/26/2020 04:25 06/26/2020 05:45 06/26/2020 06:22 06/26/2020 08:31 06/26/2020 08:57 06/26/2020 10:06 06/26/2020 11:14  Glucose-Capillary Latest Ref Range: 70 - 99 mg/dL 143 (H) 131 (H) 129 (H) 131 (H) 208 (H) 229 (H) 234 (H) 213 (H)   Diabetes history: DM  Outpatient Diabetes medications:  Fiasp 40-50 units tid with meals, Ozempic 1 mg weekly Current orders for Inpatient glycemic control:  IV insulin  Inpatient Diabetes Program Recommendations:    Note patient was only on rapid acting insulin prior to admit.  However based on overnight insulin drip rates of approximately, 2 units/hr, she likely need basal insulin as well. For transition off insulin drip, consider Levemir 24 units bid, Novolog meal coverage 10 units tid with meals and Novolog correction q 4 hours.  Will follow.  Thanks,   Adah Perl, RN, BC-ADM Inpatient Diabetes Coordinator Pager [phone] (8a-5p)

## 2020-06-26 NOTE — Progress Notes (Signed)
1 Day Post-Op Procedure(s) (LRB): CORONARY ARTERY BYPASS GRAFTING (CABG)X3. LEFT INTERNAL MAMMARY ARTERY. RIGHT ENDOSCOPIC SAPHENOUS VEIN HARVESTING. (N/A) TRANSESOPHAGEAL ECHOCARDIOGRAM (TEE) (N/A) INDOCYANINE GREEN FLUORESCENCE IMAGING (ICG) (N/A) Subjective: Dizzy with standing--attributed to pain meds  Objective: Vital signs in last 24 hours: Temp:  [94.82 F (34.9 C)-100.2 F (37.9 C)] 98.96 F (37.2 C) (05/10 0830) Pulse Rate:  [63-94] 73 (05/10 0830) Cardiac Rhythm: A-V Sequential paced (05/10 0400) Resp:  [4-32] 23 (05/10 0830) BP: (84-151)/(49-81) 140/51 (05/10 0800) SpO2:  [96 %-100 %] 97 % (05/10 0833) Arterial Line BP: (100-192)/(37-81) 161/43 (05/10 0830) FiO2 (%):  [40 %-50 %] 40 % (05/09 1640) Weight:  [93.5 kg] 93.5 kg (05/10 0635)  Hemodynamic parameters for last 24 hours: PAP: (24-47)/(8-26) 47/18 CVP:  [17 mmHg] 17 mmHg CO:  [1.6 L/min-5.4 L/min] 4.8 L/min CI:  [1.6 L/min/m2-3.2 L/min/m2] 2.5 L/min/m2  Intake/Output from previous day: 05/09 0701 - 05/10 0700 In: 6228.2 [P.O.:100; I.V.:4334.2; Blood:190; IV Piggyback:1604] Out: 5462 [Urine:3635; Blood:350; Chest Tube:670] Intake/Output this shift: Total I/O In: 205.2 [I.V.:205.2] Out: 175 [Urine:85; Chest Tube:90]  General appearance: alert and cooperative Neurologic: intact Heart: regular rate and rhythm, S1, S2 normal, no murmur, click, rub or gallop Lungs: clear to auscultation bilaterally Abdomen: mildy distended Extremities: edema 2+ Wound: dressed, dry  Lab Results: Recent Labs    06/25/20 1825 06/26/20 0430  WBC 15.4* 15.0*  HGB 8.7* 8.6*  HCT 26.2* 25.8*  PLT 165 151   BMET:  Recent Labs    06/25/20 1825 06/26/20 0430  NA 133* 133*  K 4.2 3.8  CL 104 101  CO2 24 25  GLUCOSE 124* 121*  BUN 11 9  CREATININE 0.68 0.66  CALCIUM 7.5* 7.6*    PT/INR:  Recent Labs    06/25/20 1404  LABPROT 16.7*  INR 1.4*   ABG    Component Value Date/Time   PHART 7.357 06/25/2020 1823    HCO3 24.2 06/25/2020 1823   TCO2 25 06/25/2020 1823   ACIDBASEDEF 1.0 06/25/2020 1823   O2SAT 63.4 06/26/2020 0430   CBG (last 3)  Recent Labs    06/26/20 0622 06/26/20 0831 06/26/20 0857  GLUCAP 131* 208* 229*    Assessment/Plan: S/P Procedure(s) (LRB): CORONARY ARTERY BYPASS GRAFTING (CABG)X3. LEFT INTERNAL MAMMARY ARTERY. RIGHT ENDOSCOPIC SAPHENOUS VEIN HARVESTING. (N/A) TRANSESOPHAGEAL ECHOCARDIOGRAM (TEE) (N/A) INDOCYANINE GREEN FLUORESCENCE IMAGING (ICG) (N/A) Mobilize wean milrinone  D/c pa cath Gentle resuscitation    LOS: 1 day    Wonda Olds 06/26/2020

## 2020-06-26 NOTE — Progress Notes (Signed)
Patient ID: Leah Ferrell, female   DOB: 05-Sep-1950, 70 y.o.   MRN: [id number]  TCTS Evening Rounds:   Hemodynamically stable off milrinone. CO-ox 53.2 at Noon off milrinone. Preop EF nl. HR low 60's and pacer on backup so will put on AAI 80 for now to maximize CO.   Awake and alert, no complaints.  Urine output good  CT output low  CBC    Component Value Date/Time   WBC 18.5 (H) 06/26/2020 1644   RBC 2.70 (L) 06/26/2020 1644   HGB 8.6 (L) 06/26/2020 1644   HGB 13.9 05/22/2020 1119   HCT 26.2 (L) 06/26/2020 1644   HCT 42.3 05/22/2020 1119   PLT 151 06/26/2020 1644   PLT 372 05/22/2020 1119   MCV 97.0 06/26/2020 1644   MCV 95 05/22/2020 1119   MCH 31.9 06/26/2020 1644   MCHC 32.8 06/26/2020 1644   RDW 13.5 06/26/2020 1644   RDW 12.8 05/22/2020 1119   LYMPHSABS 2.9 05/22/2020 1119   MONOABS 1.0 12/12/2018 2202   EOSABS 0.2 05/22/2020 1119   BASOSABS 0.1 05/22/2020 1119     BMET    Component Value Date/Time   NA 129 (L) 06/26/2020 1644   NA 137 05/24/2020 1622   K 3.9 06/26/2020 1644   CL 98 06/26/2020 1644   CO2 25 06/26/2020 1644   GLUCOSE 122 (H) 06/26/2020 1644   BUN 10 06/26/2020 1644   BUN 13 05/24/2020 1622   CREATININE 0.72 06/26/2020 1644   CREATININE 0.78 03/11/2016 1234   CALCIUM 7.4 (L) 06/26/2020 1644   GFRNONAA >60 06/26/2020 1644   GFRAA 88 03/20/2020 1222     A/P:  Stable postop course. Continue current plans

## 2020-06-27 ENCOUNTER — Inpatient Hospital Stay (HOSPITAL_COMMUNITY): Payer: Medicare Other

## 2020-06-27 LAB — COOXEMETRY PANEL
Carboxyhemoglobin: 0.8 % (ref 0.5–1.5)
Methemoglobin: 1 % (ref 0.0–1.5)
O2 Saturation: 61 %
Total hemoglobin: 9.4 g/dL — ABNORMAL LOW (ref 12.0–16.0)

## 2020-06-27 LAB — BASIC METABOLIC PANEL
Anion gap: 5 (ref 5–15)
BUN: 10 mg/dL (ref 8–23)
CO2: 26 mmol/L (ref 22–32)
Calcium: 7.4 mg/dL — ABNORMAL LOW (ref 8.9–10.3)
Chloride: 99 mmol/L (ref 98–111)
Creatinine, Ser: 0.67 mg/dL (ref 0.44–1.00)
GFR, Estimated: >60 mL/min (ref >60)
Glucose, Bld: 143 mg/dL — ABNORMAL HIGH (ref 70–99)
Potassium: 4.1 mmol/L (ref 3.5–5.1)
Sodium: 130 mmol/L — ABNORMAL LOW (ref 135–145)

## 2020-06-27 LAB — GLUCOSE, CAPILLARY
Glucose-Capillary: 126 mg/dL — ABNORMAL HIGH (ref 70–99)
Glucose-Capillary: 131 mg/dL — ABNORMAL HIGH (ref 70–99)
Glucose-Capillary: 134 mg/dL — ABNORMAL HIGH (ref 70–99)
Glucose-Capillary: 143 mg/dL — ABNORMAL HIGH (ref 70–99)
Glucose-Capillary: 146 mg/dL — ABNORMAL HIGH (ref 70–99)
Glucose-Capillary: 157 mg/dL — ABNORMAL HIGH (ref 70–99)
Glucose-Capillary: 167 mg/dL — ABNORMAL HIGH (ref 70–99)
Glucose-Capillary: 174 mg/dL — ABNORMAL HIGH (ref 70–99)
Glucose-Capillary: 189 mg/dL — ABNORMAL HIGH (ref 70–99)
Glucose-Capillary: 209 mg/dL — ABNORMAL HIGH (ref 70–99)
Glucose-Capillary: 265 mg/dL — ABNORMAL HIGH (ref 70–99)

## 2020-06-27 LAB — CBC
HCT: 25.9 % — ABNORMAL LOW (ref 36.0–46.0)
Hemoglobin: 8.5 g/dL — ABNORMAL LOW (ref 12.0–15.0)
MCH: 32.2 pg (ref 26.0–34.0)
MCHC: 32.8 g/dL (ref 30.0–36.0)
MCV: 98.1 fL (ref 80.0–100.0)
Platelets: 160 10*3/uL (ref 150–400)
RBC: 2.64 MIL/uL — ABNORMAL LOW (ref 3.87–5.11)
RDW: 13.3 % (ref 11.5–15.5)
WBC: 21 10*3/uL — ABNORMAL HIGH (ref 4.0–10.5)
nRBC: 0 % (ref 0.0–0.2)

## 2020-06-27 MED ORDER — INSULIN ASPART 100 UNIT/ML IJ SOLN
0.0000 [IU] | INTRAMUSCULAR | Status: DC
  Administered 2020-06-27: 8 [IU] via SUBCUTANEOUS
  Administered 2020-06-27: 4 [IU] via SUBCUTANEOUS
  Administered 2020-06-27: 12 [IU] via SUBCUTANEOUS
  Administered 2020-06-27 – 2020-06-28 (×2): 2 [IU] via SUBCUTANEOUS
  Administered 2020-06-28 (×3): 8 [IU] via SUBCUTANEOUS
  Administered 2020-06-29: 4 [IU] via SUBCUTANEOUS
  Administered 2020-06-29 (×2): 12 [IU] via SUBCUTANEOUS
  Administered 2020-06-29 – 2020-06-30 (×5): 2 [IU] via SUBCUTANEOUS
  Administered 2020-06-30: 4 [IU] via SUBCUTANEOUS
  Administered 2020-06-30: 2 [IU] via SUBCUTANEOUS
  Administered 2020-06-30: 4 [IU] via SUBCUTANEOUS
  Administered 2020-07-01: 12 [IU] via SUBCUTANEOUS
  Administered 2020-07-01 (×2): 4 [IU] via SUBCUTANEOUS
  Administered 2020-07-01: 8 [IU] via SUBCUTANEOUS
  Administered 2020-07-01: 2 [IU] via SUBCUTANEOUS
  Administered 2020-07-02: 4 [IU] via SUBCUTANEOUS
  Administered 2020-07-02: 2 [IU] via SUBCUTANEOUS
  Administered 2020-07-02: 4 [IU] via SUBCUTANEOUS
  Administered 2020-07-02: 16 [IU] via SUBCUTANEOUS
  Administered 2020-07-03 (×2): 2 [IU] via SUBCUTANEOUS

## 2020-06-27 MED ORDER — LEVALBUTEROL HCL 0.63 MG/3ML IN NEBU: 0.6300 mg | INHALATION_SOLUTION | Freq: Four times a day (QID) | RESPIRATORY_TRACT | Status: DC | PRN

## 2020-06-27 MED ORDER — LEVALBUTEROL HCL 0.63 MG/3ML IN NEBU
INHALATION_SOLUTION | RESPIRATORY_TRACT | Status: AC
  Administered 2020-06-27: 0.63 mg via RESPIRATORY_TRACT
  Filled 2020-06-27: qty 3

## 2020-06-27 MED ORDER — FUROSEMIDE 10 MG/ML IJ SOLN
40.0000 mg | Freq: Two times a day (BID) | INTRAMUSCULAR | Status: DC
  Administered 2020-06-27 – 2020-06-28 (×4): 40 mg via INTRAVENOUS
  Filled 2020-06-27 (×6): qty 4

## 2020-06-27 MED ORDER — IBUPROFEN 400 MG PO TABS
400.0000 mg | ORAL_TABLET | Freq: Three times a day (TID) | ORAL | Status: AC
  Administered 2020-06-27 – 2020-06-28 (×5): 400 mg via ORAL
  Filled 2020-06-27: qty 2
  Filled 2020-06-27: qty 1
  Filled 2020-06-27: qty 2
  Filled 2020-06-27 (×3): qty 1

## 2020-06-27 MED ORDER — AMLODIPINE BESYLATE 5 MG PO TABS
5.0000 mg | ORAL_TABLET | Freq: Every day | ORAL | Status: DC
  Administered 2020-06-27 – 2020-07-03 (×7): 5 mg via ORAL
  Filled 2020-06-27 (×7): qty 1

## 2020-06-27 MED ORDER — HYDRALAZINE HCL 10 MG PO TABS
10.0000 mg | ORAL_TABLET | Freq: Four times a day (QID) | ORAL | Status: DC | PRN
  Administered 2020-06-27 – 2020-07-01 (×6): 10 mg via ORAL
  Filled 2020-06-27 (×6): qty 1

## 2020-06-27 MED ORDER — METOPROLOL TARTRATE 25 MG/10 ML ORAL SUSPENSION
25.0000 mg | Freq: Two times a day (BID) | ORAL | Status: DC
  Administered 2020-06-30: 25 mg
  Filled 2020-06-27 (×12): qty 10

## 2020-06-27 MED ORDER — CLOPIDOGREL BISULFATE 75 MG PO TABS
75.0000 mg | ORAL_TABLET | Freq: Every day | ORAL | Status: DC
  Administered 2020-06-27 – 2020-07-03 (×7): 75 mg via ORAL
  Filled 2020-06-27 (×7): qty 1

## 2020-06-27 MED ORDER — INSULIN ASPART 100 UNIT/ML IJ SOLN
10.0000 [IU] | Freq: Three times a day (TID) | INTRAMUSCULAR | Status: DC
  Administered 2020-06-27 – 2020-07-03 (×18): 10 [IU] via SUBCUTANEOUS

## 2020-06-27 MED ORDER — ASPIRIN EC 81 MG PO TBEC
81.0000 mg | DELAYED_RELEASE_TABLET | Freq: Every day | ORAL | Status: DC
  Administered 2020-06-27 – 2020-07-03 (×7): 81 mg via ORAL
  Filled 2020-06-27 (×7): qty 1

## 2020-06-27 MED ORDER — INSULIN DETEMIR 100 UNIT/ML ~~LOC~~ SOLN
24.0000 [IU] | Freq: Two times a day (BID) | SUBCUTANEOUS | Status: DC
  Administered 2020-06-27 – 2020-07-03 (×13): 24 [IU] via SUBCUTANEOUS
  Filled 2020-06-27 (×15): qty 0.24

## 2020-06-27 MED ORDER — METOPROLOL TARTRATE 25 MG PO TABS
25.0000 mg | ORAL_TABLET | Freq: Two times a day (BID) | ORAL | Status: DC
  Administered 2020-06-27 – 2020-07-03 (×12): 25 mg via ORAL
  Filled 2020-06-27 (×7): qty 1
  Filled 2020-06-27: qty 2
  Filled 2020-06-27 (×3): qty 1

## 2020-06-27 MED FILL — Mannitol IV Soln 20%: INTRAVENOUS | Qty: 500 | Status: AC

## 2020-06-27 MED FILL — Lidocaine HCl Local Soln Prefilled Syringe 100 MG/5ML (2%): INTRAMUSCULAR | Qty: 5 | Status: AC

## 2020-06-27 MED FILL — Electrolyte-R (PH 7.4) Solution: INTRAVENOUS | Qty: 5000 | Status: AC

## 2020-06-27 MED FILL — Sodium Chloride IV Soln 0.9%: INTRAVENOUS | Qty: 2000 | Status: AC

## 2020-06-27 MED FILL — Sodium Bicarbonate IV Soln 8.4%: INTRAVENOUS | Qty: 50 | Status: AC

## 2020-06-27 NOTE — Evaluation (Signed)
Physical Therapy Evaluation Patient Details Name: Leah Ferrell MRN: [id number] DOB: 28-Sep-1950 Today's Date: 06/27/2020   History of Present Illness  Pt is a 70 y/o female admitted 06/25/20 for CABG due to extensive cardiovascular and peripheral vascular calcific disease, recurrent disease in L coronary system. S/P CABG x 3 5/9. PMH includes: CAD, CHF, DM, HTN, morbid obesity, NSTEMI, PVD, renal artery stensosis, cardiac cath x 2.  Clinical Impression  Pt admitted with/for scheduled CABG.  Pt education initiated, instructed in sternal precautions.  Pt needing mod to min assist for basic mobility..  Pt currently limited functionally due to the problems listed below.  (see problems list.)  Pt will benefit from PT to maximize function and safety to be able to get home safely with available assist.     Follow Up Recommendations Home health PT;Supervision/Assistance - 24 hour;Supervision - Intermittent;Other (comment) (pt reports she could go to her daugter's home)    Equipment Recommendations       Recommendations for Other Services       Precautions / Restrictions Precautions Precautions: Sternal;Other (comment) Precaution Booklet Issued: No Precaution Comments: temp pacer, chest tube Restrictions Weight Bearing Restrictions: Yes Other Position/Activity Restrictions: sternal precautions      Mobility  Bed Mobility Overal bed mobility: Needs Assistance Bed Mobility: Rolling;Sidelying to Sit Rolling: Mod assist Sidelying to sit: Mod assist       General bed mobility comments: mod assist for trunk support to ascend from sidelying, increased time required    Transfers Overall transfer level: Needs assistance Equipment used: Rolling walker (2 wheeled) Transfers: Sit to/from Stand Sit to Stand: Min assist;+2 safety/equipment         General transfer comment: min assist +2 safety with cueing for precaution adherence  Ambulation/Gait             General Gait  Details: transfers only  Stairs            Wheelchair Mobility    Modified Rankin (Stroke Patients Only)       Balance Overall balance assessment: Needs assistance Sitting-balance support: No upper extremity supported;Feet supported Sitting balance-[name] Scale: Fair     Standing balance support: Bilateral upper extremity supported;During functional activity Standing balance-[name] Scale: Poor Standing balance comment: relies on BUE support                             Pertinent Vitals/Pain Pain Assessment: Faces Faces Pain Scale: Hurts little more Pain Location: incisonal-chest Pain Descriptors / Indicators: Discomfort;Operative site guarding Pain Intervention(s): Limited activity within patient's tolerance;Monitored during session    Home Living Family/patient expects to be discharged to:: Private residence Living Arrangements: Alone Available Help at Discharge: Family Type of Home: House Home Access: Stairs to enter Entrance Stairs-Rails:  (+ rail) Technical brewer of Steps: 1-3 Home Layout: One level Home Equipment: Environmental consultant - 2 wheels;Cane - single point      Prior Function Level of Independence: Independent         Comments: driving     Hand Dominance   Dominant Hand: Right    Extremity/Trunk Assessment   Upper Extremity Assessment Upper Extremity Assessment: Generalized weakness    Lower Extremity Assessment Lower Extremity Assessment: Generalized weakness       Communication   Communication: No difficulties  Cognition Arousal/Alertness: Awake/alert Behavior During Therapy: Flat affect Overall Cognitive Status: No family/caregiver present to determine baseline cognitive functioning Area of Impairment: Following commands;Safety/judgement;Awareness;Problem solving  Following Commands: Follows one step commands consistently;Follows multi-step commands with increased time Safety/Judgement:  Decreased awareness of safety;Decreased awareness of deficits Awareness: Emergent Problem Solving: Slow processing;Decreased initiation;Difficulty sequencing;Requires verbal cues General Comments: pt with decreased awareness of deficits and need for assistance, requires verbal cueing and increased time for command following      General Comments General comments (skin integrity, edema, etc.): vss during session SpO2 88-92% on RA, Reporting dizziness, therefore reapplied her 02 (on 4L upon entry)    Exercises     Assessment/Plan    PT Assessment Patient needs continued PT services  PT Problem List Decreased strength;Decreased activity tolerance;Decreased balance;Decreased mobility;Cardiopulmonary status limiting activity;Pain;Decreased knowledge of precautions       PT Treatment Interventions DME instruction;Gait training;Stair training;Functional mobility training;Therapeutic activities;Patient/family education    PT Goals (Current goals can be found in the Care Plan section)  Acute Rehab PT Goals Patient Stated Goal: to get home PT Goal Formulation: With patient Time For Goal Achievement: 07/11/20 Potential to Achieve Goals: Good    Frequency Min 3X/week   Barriers to discharge        Co-evaluation PT/OT/SLP Co-Evaluation/Treatment: Yes Reason for Co-Treatment: For patient/therapist safety;To address functional/ADL transfers PT goals addressed during session: Mobility/safety with mobility OT goals addressed during session: ADL's and self-care       AM-PAC PT "6 Clicks" Mobility  Outcome Measure Help needed turning from your back to your side while in a flat bed without using bedrails?: A Lot Help needed moving from lying on your back to sitting on the side of a flat bed without using bedrails?: A Lot Help needed moving to and from a bed to a chair (including a wheelchair)?: A Lot Help needed standing up from a chair using your arms (e.g., wheelchair or bedside chair)?:  A Lot Help needed to walk in hospital room?: A Lot Help needed climbing 3-5 steps with a railing? : A Lot 6 Click Score: 12    End of Session Equipment Utilized During Treatment: Oxygen Activity Tolerance: Patient tolerated treatment well;Patient limited by fatigue   Nurse Communication: Mobility status PT Visit Diagnosis: Unsteadiness on feet (R26.81);Muscle weakness (generalized) (M62.81);Pain Pain - part of body:  (sternal)    Time: 4982-6415 PT Time Calculation (min) (ACUTE ONLY): 26 min   Charges:   PT Evaluation $PT Eval Moderate Complexity: 1 Mod          06/27/2020  Ginger Carne., PT Acute Rehabilitation Services [phone]  (pager) [phone]  (office)  Tessie Fass Jax Abdelrahman 06/27/2020, 2:32 PM

## 2020-06-27 NOTE — Progress Notes (Signed)
Spoke with Leah Ferrell patients daughter and notified her of transfer to 45E02.  We also discussed discharge plans.  Her daughters have set up a room for her a their home and Leah Ferrell will be there with her working remotely.  Pt had told family she was going home to her house alone.  I have talked with pt and made her aware that she will need to go to her daughters home in order to have the assistance that she needs.  She verbalized understanding.  Daughters request to be kept up to date as to what is going on and to learn how to care for her at home.

## 2020-06-27 NOTE — Progress Notes (Signed)
Pt arrived to rm 2 from [facility] Initiated tele. CHG wipe performed. VSS. Oriented pt to the unit. Call bell within reach. Pt"s 02 sat =92 % at [place], RN

## 2020-06-27 NOTE — Progress Notes (Signed)
Inpatient Rehab Admissions Coordinator:   Consult received.  Note therapy recommending home health at this time.  Will follow from the periphery for progress.  It would be difficult to get [place] approval for CIR, but we will follow from the periphery for progress and if she does not progress well with therapy we could look into CIR.    Shann Medal, PT, DPT Admissions Coordinator [phone] 06/27/20  2:57 PM

## 2020-06-27 NOTE — Progress Notes (Addendum)
[address] City[address]       [place] [address]             [phone]      2 Days Post-Op Procedure(s) (LRB): CORONARY ARTERY BYPASS GRAFTING (CABG)X3. LEFT INTERNAL MAMMARY ARTERY. RIGHT ENDOSCOPIC SAPHENOUS VEIN HARVESTING. (N/A) TRANSESOPHAGEAL ECHOCARDIOGRAM (TEE) (N/A) INDOCYANINE GREEN FLUORESCENCE IMAGING (ICG) (N/A) Subjective: Some SOB  Objective: Vital signs in last 24 hours: Temp:  [98.24 F (36.8 C)-99.68 F (37.6 C)] 98.24 F (36.8 C) (05/11 0630) Pulse Rate:  [61-82] 80 (05/11 0630) Cardiac Rhythm: Atrial paced (05/11 0400) Resp:  [10-49] 33 (05/11 0630) BP: (116-198)/(45-113) 132/64 (05/11 0630) SpO2:  [82 %-100 %] 99 % (05/11 0731) Arterial Line BP: (109-234)/(41-72) 145/48 (05/11 0630) Weight:  [99.4 kg] 99.4 kg (05/11 0500)  Hemodynamic parameters for last 24 hours: PAP: (42-63)/(13-27) 47/18 CO:  [5.3 L/min-5.4 L/min] 5.4 L/min CI:  [2.8 L/min/m2] 2.8 L/min/m2  Intake/Output from previous day: 05/10 0701 - 05/11 0700 In: 1818.4 [I.V.:1618.3; IV Piggyback:200.1] Out: 1680 [Urine:1080; Chest Tube:600] Intake/Output this shift: No intake/output data recorded.  General appearance: alert, cooperative, fatigued and no distress Heart: regular rate and rhythm Lungs: mildly dim in bases Abdomen: benign Extremities: min edema Wound: dressings CDI  Lab Results: Recent Labs    06/26/20 1644 06/27/20 0412  WBC 18.5* 21.0*  HGB 8.6* 8.5*  HCT 26.2* 25.9*  PLT 151 160   BMET:  Recent Labs    06/26/20 1644 06/27/20 0412  NA 129* 130*  K 3.9 4.1  CL 98 99  CO2 25 26  GLUCOSE 122* 143*  BUN 10 10  CREATININE 0.72 0.67  CALCIUM 7.4* 7.4*    PT/INR:  Recent Labs    06/25/20 1404  LABPROT 16.7*  INR 1.4*   ABG    Component Value Date/Time   PHART 7.357 06/25/2020 1823   HCO3 24.2 06/25/2020 1823   TCO2 25 06/25/2020 1823   ACIDBASEDEF 1.0 06/25/2020 1823   O2SAT 61.0 06/27/2020 0412   CBG (last 3)  Recent Labs     06/27/20 0414 06/27/20 0508 06/27/20 0612  GLUCAP 143* 157* 131*    Meds Scheduled Meds: . acetaminophen  1,000 mg Oral Q6H   Or  . acetaminophen (TYLENOL) oral liquid 160 mg/5 mL  1,000 mg Per Tube Q6H  . amLODipine  5 mg Oral Daily  . aspirin EC  81 mg Oral Daily  . bisacodyl  10 mg Oral Daily   Or  . bisacodyl  10 mg Rectal Daily  . Chlorhexidine Gluconate Cloth  6 each Topical Daily  . clopidogrel  75 mg Oral Daily  . colchicine  0.3 mg Oral BID  . docusate sodium  200 mg Oral Daily  . furosemide  40 mg Intravenous BID  . ketorolac  7.5 mg Intravenous Q6H  . levalbuterol  0.63 mg Nebulization BID  . mouth rinse  15 mL Mouth Rinse BID  . metoCLOPramide (REGLAN) injection  5 mg Intravenous Q6H  . metoprolol tartrate  25 mg Oral BID   Or  . metoprolol tartrate  25 mg Per Tube BID  . pantoprazole  40 mg Oral Daily  . rosuvastatin  40 mg Oral Daily  . sodium chloride flush  3 mL Intravenous Q12H   Continuous Infusions: . sodium chloride    . insulin 3.8 mL/hr at 06/27/20 0600  . lactated ringers    . lactated ringers 20 mL/hr at 06/27/20 0600  . niCARDipine 12.5 mg/hr (  06/27/20 1194)  . nitroGLYCERIN 5 mcg/min (06/26/20 2242)   PRN Meds:.dextrose, lactated ringers, levalbuterol, metoprolol tartrate, morphine injection, ondansetron (ZOFRAN) IV, oxyCODONE, sodium chloride flush, traMADol  Xrays DG CHEST PORT 1 VIEW  Result Date: 06/27/2020 CLINICAL DATA:  70 year old female with respiratory distress. EXAM: PORTABLE CHEST 1 VIEW COMPARISON:  Chest radiograph dated 06/26/2020. FINDINGS: Interval removal of the Swan-Ganz catheter. A right IJ central venous line is noted with tip over upper SVC. No pneumothorax. Bilateral chest tubes in similar position. Similar cardiomegaly and central vascular congestion. No new consolidation. Median sternotomy wires. No acute osseous pathology. IMPRESSION: 1. Interval removal of the Swan-Ganz catheter. 2. Right IJ central venous line with  tip over upper SVC. No pneumothorax. 3. No interval change in the appearance of the lungs. Electronically Signed   By: Anner Crete M.D.   On: 06/27/2020 00:52   DG Chest Port 1 View  Result Date: 06/26/2020 CLINICAL DATA:  Chest tube, open heart surgery EXAM: PORTABLE CHEST 1 VIEW COMPARISON:  06/25/2020 FINDINGS: Interval extubation and removal of NG tube. Swan-Ganz catheter and bilateral chest tubes remain in place, unchanged. No pneumothorax. Cardiomegaly with vascular congestion. Left base atelectasis. IMPRESSION: Interval extubation. Cardiomegaly with vascular congestion. Left base atelectasis. Electronically Signed   By: Rolm Baptise M.D.   On: 06/26/2020 08:20   DG Chest Port 1 View  Result Date: 06/25/2020 CLINICAL DATA:  Hypoxia EXAM: PORTABLE CHEST 1 VIEW COMPARISON:  Jun 21, 2020 FINDINGS: Endotracheal tube tip is 3.2 cm above the carina. Nasogastric tube tip and side port are below the diaphragm. Swan-Ganz catheter tip is in the main pulmonary outflow tract. There is a chest tube on each side. Temporary pacemaker wires are attached to the right heart. No pneumothorax. There is a small left pleural effusion with mild bibasilar atelectasis. No consolidation. Heart upper normal in size with pulmonary vascularity normal. There is aortic atherosclerosis. Status post median sternotomy. No bone lesions. IMPRESSION: Tube and catheter positions as described without pneumothorax. Mild bibasilar atelectasis with small left pleural effusion. No edema or consolidation. Aortic Atherosclerosis (ICD10-I70.0). Electronically Signed   By: Lowella Grip III M.D.   On: 06/25/2020 13:40    Assessment/Plan: S/P Procedure(s) (LRB): CORONARY ARTERY BYPASS GRAFTING (CABG)X3. LEFT INTERNAL MAMMARY ARTERY. RIGHT ENDOSCOPIC SAPHENOUS VEIN HARVESTING. (N/A) TRANSESOPHAGEAL ECHOCARDIOGRAM (TEE) (N/A) INDOCYANINE GREEN FLUORESCENCE IMAGING (ICG) (N/A)  1 Tmax 99.6, SBP 110's to 190's, Co-Ox 61, on cardene and  nitro- stopping this am, adding norvasc and increasing beta blocker. Sinus/apaced at times with some PVC's 2 sats good on 8 liters HFNC- cont pulm toilet/flutter valve ordered 3 leukocytosis with increasing trend to 21K 4 expected ABLA , fairly stable 5 worsening hyponatemia 6 CBG well controlled for hospitalized patient 7 normal renal fxn, fair UOP- lasix ordered IV 9 now on DAPT 10 CXR some perihilar congestion 11 will get rehab consult for poss CIR as she is pretty deconditioned 12 CT 600/24 h    LOS: 2 days    [name] PA-C Pager [phone] 06/27/2020 Pt seen and examined; agree with documentation. Plan diuresis. Leave chest tubes; DAPT. Barret Esquivel Z. Orvan Seen, [place]

## 2020-06-27 NOTE — Evaluation (Addendum)
Occupational Therapy Evaluation Patient Details Name: Leah Ferrell MRN: [id number] DOB: 1950/10/20 Today's Date: 06/27/2020    History of Present Illness Pt is a 70 y/o female admitted 06/25/20 for CABG due to extensive cardiovascular and peripheral vascular calcific disease, recurrent disease in L coronary system. S/P CABG x 3 5/9. PMH includes: CAD, CHF, DM, HTN, morbid obesity, NSTEMI, PVD, renal artery stensosis, cardiac cath x 2.   Clinical Impression   PTA patient independent and driving. Admitted for above and presenting with deficits below, including impaired balance, decreased activity tolerance, sternal precautions and adherence and cognition.  She follows commands with increased time, requires cueing to recall and adhere to precautions and presents with decreased awareness increased assistance needed/how sternal precautions will affect daily routine.  Patient currently requires min assist +2 safety for transfers and mobility (limited in room) using RW, up to mod assist for UB ADLS and max assist for LB ADLs.  She will benefit from further OT services while admitted to optimize independence with ADLs and transfers.  Believe she will best benefit from [facility] services at discharge, but recommend she has 24/7 support (reports she can dc to her daughters home, and he daughter can assist her). If she doesn't have 24/7 support, may need to look into SNF.     Follow Up Recommendations  Home health OT;Supervision/Assistance - 24 hour    Equipment Recommendations  3 in 1 bedside commode    Recommendations for Other Services       Precautions / Restrictions Precautions Precautions: Sternal;Other (comment) Precaution Booklet Issued: No Precaution Comments: temp pacer, chest tube Restrictions Weight Bearing Restrictions: Yes Other Position/Activity Restrictions: sternal precautions      Mobility Bed Mobility Overal bed mobility: Needs Assistance Bed Mobility: Rolling;Sidelying to  Sit Rolling: Mod assist Sidelying to sit: Mod assist       General bed mobility comments: mod assist for trunk support to ascend from sidelying, increased time required    Transfers Overall transfer level: Needs assistance Equipment used: Rolling walker (2 wheeled) Transfers: Sit to/from Stand Sit to Stand: Min assist;+2 safety/equipment         General transfer comment: min assist +2 safety with cueing for precaution adherence    Balance Overall balance assessment: Needs assistance Sitting-balance support: No upper extremity supported;Feet supported Sitting balance-[name] Scale: Fair     Standing balance support: Bilateral upper extremity supported;During functional activity Standing balance-[name] Scale: Poor Standing balance comment: relies on BUE support                           ADL either performed or assessed with clinical judgement   ADL Overall ADL's : Needs assistance/impaired     Grooming: Sitting;Minimal assistance   Upper Body Bathing: Sitting;Moderate assistance   Lower Body Bathing: Moderate assistance;Sit to/from stand   Upper Body Dressing : Moderate assistance;Sitting   Lower Body Dressing: Maximal assistance;Sit to/from stand Lower Body Dressing Details (indicate cue type and reason): requires assist for socks, min assist sit to stand Toilet Transfer: Minimal assistance;+2 for safety/equipment;Ambulation;RW Toilet Transfer Details (indicate cue type and reason): simulated to recliner         Functional mobility during ADLs: Minimal assistance;Rolling walker;Cueing for safety General ADL Comments: pt limited by weakness, activity tolerance, dizziness, and impaired balance as well as sternal precautions     Vision   Vision Assessment?: No apparent visual deficits     Perception     Praxis  Pertinent Vitals/Pain Pain Assessment: Faces Faces Pain Scale: Hurts little more Pain Location: incisonal-chest Pain Descriptors /  Indicators: Discomfort;Operative site guarding Pain Intervention(s): Limited activity within patient's tolerance;Monitored during session;Repositioned     Hand Dominance Right   Extremity/Trunk Assessment Upper Extremity Assessment Upper Extremity Assessment: Generalized weakness (pt limiting use of UEs due to pain in chest, noted BUE edema and decreased [facility])   Lower Extremity Assessment Lower Extremity Assessment: Defer to PT evaluation       Communication Communication Communication: No difficulties   Cognition Arousal/Alertness: Awake/alert Behavior During Therapy: Flat affect Overall Cognitive Status: No family/caregiver present to determine baseline cognitive functioning Area of Impairment: Following commands;Safety/judgement;Awareness;Problem solving                       Following Commands: Follows one step commands consistently;Follows multi-step commands with increased time Safety/Judgement: Decreased awareness of safety;Decreased awareness of deficits Awareness: Emergent Problem Solving: Slow processing;Decreased initiation;Difficulty sequencing;Requires verbal cues General Comments: pt with decreased awareness of deficits and need for assistance, requires verbal cueing and increased time for command following   General Comments  VSS during session, SpO2 88-92% on RA but reports dizzy therefore O2 reapplied (on 4L upon entry)    Exercises     Shoulder Instructions      Home Living Family/patient expects to be discharged to:: Private residence Living Arrangements: Alone Available Help at Discharge: Family Type of Home: House Home Access: Stairs to enter Technical brewer of Steps: 1-3 Entrance Stairs-Rails:  (+ rail) Home Layout: One level     Bathroom Shower/Tub: Tub/shower unit;Walk-in shower   Bathroom Toilet: Standard     Home Equipment: Environmental consultant - 2 wheels;Cane - single point          Prior Functioning/Environment Level of  Independence: Independent        Comments: driving        OT Problem List: Decreased strength;Decreased range of motion;Decreased activity tolerance;Impaired balance (sitting and/or standing);Decreased coordination;Decreased cognition;Decreased safety awareness;Decreased knowledge of use of DME or AE;Decreased knowledge of precautions;Cardiopulmonary status limiting activity;Obesity;Pain      OT Treatment/Interventions: Self-care/ADL training;Energy conservation;DME and/or AE instruction;Therapeutic activities;Patient/family education;Balance training    OT Goals(Current goals can be found in the care plan section) Acute Rehab OT Goals Patient Stated Goal: to get home OT Goal Formulation: With patient Time For Goal Achievement: 07/11/20 Potential to Achieve Goals: Good  OT Frequency: Min 2X/week   Barriers to D/C:            Co-evaluation PT/OT/SLP Co-Evaluation/Treatment: Yes Reason for Co-Treatment: For patient/therapist safety;To address functional/ADL transfers;Other (comment) (act tolerance)   OT goals addressed during session: ADL's and self-care      AM-PAC OT "6 Clicks" Daily Activity     Outcome Measure Help from another person eating meals?: A Little Help from another person taking care of personal grooming?: A Little Help from another person toileting, which includes using toliet, bedpan, or urinal?: A Lot Help from another person bathing (including washing, rinsing, drying)?: A Lot Help from another person to put on and taking off regular upper body clothing?: A Lot Help from another person to put on and taking off regular lower body clothing?: A Lot 6 Click Score: 14   End of Session Equipment Utilized During Treatment: Rolling walker;Oxygen Nurse Communication: Mobility status  Activity Tolerance: Patient tolerated treatment well Patient left: in chair;with call bell/phone within reach  OT Visit Diagnosis: Other abnormalities of gait and mobility  (R26.89);Muscle weakness (generalized) (  M62.81);Pain;Other symptoms and signs involving cognitive function                Time: 8889-1694 OT Time Calculation (min): 24 min Charges:  OT General Charges $OT Visit: 1 Visit OT Evaluation $OT Eval Moderate Complexity: 1 Mod  Jolaine Artist, OT Acute Rehabilitation Services Pager [phone] Office [phone]   Leah Ferrell 06/27/2020, 1:03 PM

## 2020-06-27 NOTE — Plan of Care (Signed)
  Problem: Education: Goal: Knowledge of General Education information will improve Description: Including pain rating scale, medication(s)/side effects and non-pharmacologic comfort measures Outcome: Progressing   Problem: Health Behavior/Discharge Planning: Goal: Ability to manage health-related needs will improve Outcome: Progressing   Problem: Clinical Measurements: Goal: Ability to maintain clinical measurements within normal limits will improve Outcome: Progressing Goal: Will remain free from infection Outcome: Progressing Goal: Diagnostic test results will improve Outcome: Progressing Goal: Respiratory complications will improve Outcome: Progressing Goal: Cardiovascular complication will be avoided Outcome: Progressing   Problem: Activity: Goal: Risk for activity intolerance will decrease Outcome: Progressing   Problem: Nutrition: Goal: Adequate nutrition will be maintained Outcome: Progressing   Problem: Coping: Goal: Level of anxiety will decrease Outcome: Progressing   Problem: Elimination: Goal: Will not experience complications related to bowel motility Outcome: Progressing Goal: Will not experience complications related to urinary retention Outcome: Progressing   Problem: Pain Managment: Goal: General experience of comfort will improve Outcome: Progressing   Problem: Safety: Goal: Ability to remain free from injury will improve Outcome: Progressing   Problem: Skin Integrity: Goal: Risk for impaired skin integrity will decrease Outcome: Progressing   Problem: Education: Goal: Will demonstrate proper wound care and an understanding of methods to prevent future damage Outcome: Progressing Goal: Knowledge of disease or condition will improve Outcome: Progressing Goal: Knowledge of the prescribed therapeutic regimen will improve Outcome: Progressing Goal: Individualized Educational Video(s) Outcome: Progressing   Problem: Activity: Goal: Risk for  activity intolerance will decrease Outcome: Progressing   Problem: Cardiac: Goal: Will achieve and/or maintain hemodynamic stability Outcome: Progressing   Problem: Clinical Measurements: Goal: Postoperative complications will be avoided or minimized Outcome: Progressing   Problem: Respiratory: Goal: Respiratory status will improve Outcome: Progressing   Problem: Skin Integrity: Goal: Wound healing without signs and symptoms of infection Outcome: Progressing Goal: Risk for impaired skin integrity will decrease Outcome: Progressing   Problem: Urinary Elimination: Goal: Ability to achieve and maintain adequate renal perfusion and functioning will improve Outcome: Progressing   Problem: Education: Goal: Will demonstrate proper wound care and an understanding of methods to prevent future damage Outcome: Progressing Goal: Knowledge of disease or condition will improve Outcome: Progressing Goal: Knowledge of the prescribed therapeutic regimen will improve Outcome: Progressing Goal: Individualized Educational Video(s) Outcome: Progressing   Problem: Activity: Goal: Risk for activity intolerance will decrease Outcome: Progressing   Problem: Cardiac: Goal: Will achieve and/or maintain hemodynamic stability Outcome: Progressing   Problem: Clinical Measurements: Goal: Postoperative complications will be avoided or minimized Outcome: Progressing   Problem: Respiratory: Goal: Respiratory status will improve Outcome: Progressing   Problem: Skin Integrity: Goal: Wound healing without signs and symptoms of infection Outcome: Progressing Goal: Risk for impaired skin integrity will decrease Outcome: Progressing   Problem: Urinary Elimination: Goal: Ability to achieve and maintain adequate renal perfusion and functioning will improve Outcome: Progressing   

## 2020-06-28 ENCOUNTER — Inpatient Hospital Stay (HOSPITAL_COMMUNITY): Payer: Medicare Other

## 2020-06-28 LAB — GLUCOSE, CAPILLARY
Glucose-Capillary: 110 mg/dL — ABNORMAL HIGH (ref 70–99)
Glucose-Capillary: 246 mg/dL — ABNORMAL HIGH (ref 70–99)
Glucose-Capillary: 220 mg/dL — ABNORMAL HIGH (ref 70–99)
Glucose-Capillary: 226 mg/dL — ABNORMAL HIGH (ref 70–99)
Glucose-Capillary: 137 mg/dL — ABNORMAL HIGH (ref 70–99)

## 2020-06-28 LAB — BASIC METABOLIC PANEL
Anion gap: 7 (ref 5–15)
BUN: 12 mg/dL (ref 8–23)
CO2: 29 mmol/L (ref 22–32)
Calcium: 7.7 mg/dL — ABNORMAL LOW (ref 8.9–10.3)
Chloride: 93 mmol/L — ABNORMAL LOW (ref 98–111)
Creatinine, Ser: 0.68 mg/dL (ref 0.44–1.00)
GFR, Estimated: >60 mL/min (ref >60)
Glucose, Bld: 112 mg/dL — ABNORMAL HIGH (ref 70–99)
Potassium: 3.5 mmol/L (ref 3.5–5.1)
Sodium: 129 mmol/L — ABNORMAL LOW (ref 135–145)

## 2020-06-28 LAB — CBC
HCT: 25.2 % — ABNORMAL LOW (ref 36.0–46.0)
Hemoglobin: 8.6 g/dL — ABNORMAL LOW (ref 12.0–15.0)
MCH: 32.1 pg (ref 26.0–34.0)
MCHC: 34.1 g/dL (ref 30.0–36.0)
MCV: 94 fL (ref 80.0–100.0)
Platelets: 165 10*3/uL (ref 150–400)
RBC: 2.68 MIL/uL — ABNORMAL LOW (ref 3.87–5.11)
RDW: 13.1 % (ref 11.5–15.5)
WBC: 16.8 10*3/uL — ABNORMAL HIGH (ref 4.0–10.5)
nRBC: 0.2 % (ref 0.0–0.2)

## 2020-06-28 MED ORDER — FE FUMARATE-B12-VIT C-FA-IFC PO CAPS
1.0000 | ORAL_CAPSULE | Freq: Two times a day (BID) | ORAL | Status: DC
  Administered 2020-06-28 – 2020-07-03 (×11): 1 via ORAL
  Filled 2020-06-28 (×11): qty 1

## 2020-06-28 NOTE — Progress Notes (Signed)
Mobility Specialist - Progress Note   06/28/20 1734  Mobility  Activity Ambulated in hall  Level of Assistance Moderate assist, patient does 50-74%  Assistive Device Front wheel walker  Distance Ambulated (ft) 60 ft  Mobility Ambulated with assistance in hallway  Mobility Response Tolerated well  Mobility performed by Mobility specialist  $Mobility charge 1 Mobility   Pre-mobility, 1L O2: 66 HR, 95% SpO2 During mobility, RA: 94 HR, 93% SpO2 Post-mobility, RA: 95 HR, 98% SpO2  Pt mod assist for bed mobility, standby during ambulation. She was very anxious throughout mobility, which limited her participation. Pt back in bed after walk, call bell at side.   Pricilla Handler Mobility Specialist Mobility Specialist Phone: [phone]

## 2020-06-28 NOTE — Progress Notes (Signed)
CARDIAC REHAB PHASE I   PRE:  Rate/Rhythm: 69 SR  BP:  Supine:   Sitting: 139/49  Standing:    SaO2: 99% 2L   93%RA  MODE:  Ambulation: 120 ft   POST:  Rate/Rhythm: 77 SR  BP:  Supine:   Sitting: 129/46  Standing:    SaO2: 88-89%RA, 2L 94%   In room 1L 96% 1307-1404 Pt encouraged to walk. Rocked to stand. Walked 120 ft with gait belt use,rolling walker and asst x 2. Started out on RA but had to put on 2L as she dropped to 88-89% and she became diaphoretic. Pt was a little dizzy to begin with but improved with walk. Stopped a couple of times to rest. To recliner after walk with call bell. Left on 1L. Set up lunch.  Tolerated well. Encouraged more walks with staff.   Graylon Good, RN BSN  06/28/2020 2:01 PM

## 2020-06-28 NOTE — TOC Initial Note (Signed)
Transition of Care (TOC) - Initial/Assessment Note  Marvetta Gibbons RN, BSN Transitions of Care Unit 4E- RN Case Manager See Treatment Team for direct phone #    Patient Details  Name: Leah Ferrell MRN: [id number] Date of Birth: Oct 14, 1950  Transition of Care [facility]) CM/SW Contact:    Dawayne Patricia, RN Phone Number: 06/28/2020, 3:40 PM  Clinical Narrative:                 Pt s/p CABG, orders placed for [facility] and DME needs. CM in to speak with pt at bedside. Discussed HH and DME recommendations. Per pt she plans to stay with daughter post discharge.  Pt reports she has a RW at home, wants to think about 3n1 for discharge.  List provided to pt for [facility] choice- Per CMS guidelines from medicare.gov website with star ratings (copy placed in shadow chart), Pt reporting that she is not sure she wants [facility] services, discussed recommendations and pt states she will think about it.  TOC will f/u with pt prior to discharge regarding [place] and DME for transition home.   Expected Discharge Plan: [place] Barriers to Discharge: Continued Medical Work up   Patient Goals and CMS Choice Patient states their goals for this hospitalization and ongoing recovery are:: return home with daughter CMS Medicare.gov Compare Post Acute Care list provided to:: Patient Choice offered to / list presented to : Patient  Expected Discharge Plan and Services Expected Discharge Plan: [place]   Discharge Planning Services: CM Consult Post Acute Care Choice: Home Health,Durable Medical Equipment Living arrangements for the past 2 months: Single Family Home                 DME Arranged: 3-N-1 DME Agency: [employer]       HH Arranged: PT,OT          Prior Living Arrangements/Services Living arrangements for the past 2 months: Single Family Home Lives with:: Adult [place] Patient language and need for interpreter reviewed:: Yes Do you feel safe going back to the  place where you live?: Yes      Need for Family Participation in Patient Care: Yes (Comment) Care giver support system in place?: Yes (comment)   Criminal Activity/Legal Involvement Pertinent to Current Situation/Hospitalization: No - Comment as needed  Activities of Daily Living      Permission Sought/Granted Permission sought to share information with : Chartered certified accountant granted to share information with : Yes, Verbal Permission Granted              Emotional Assessment Appearance:: Appears stated age Attitude/Demeanor/Rapport: Apprehensive Affect (typically observed): Pleasant Orientation: : Oriented to Self,Oriented to Place,Oriented to  Time,Oriented to Situation Alcohol / Substance Use: Not Applicable Psych Involvement: No (comment)  Admission diagnosis:  S/P CABG x 3 [Z95.1] Patient Active Problem List   Diagnosis Date Noted  . 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 Critical lower limb ischemia ([place]) 05/09/2020  . Bilateral renal artery stenosis ([place])   . 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 Hypertensive urgency 12/13/2018  . Osteopenia 04/27/2018  . Hypoglycemia 12/02/2017  . Acute on chronic diastolic CHF (congestive heart failure) ([place]) 12/02/2017  . Dizziness 12/01/2017  . Morbid obesity ([place]) 06/10/2017  . PMB (postmenopausal bleeding) 11/12/2015  . Hypokalemia 11/23/2014  . Leg pain, left 07/06/2014  . Vaginal atrophy 05/08/2014  . Left leg swelling 01/17/2014  . PAD (peripheral artery disease) ([place]) 10/18/2013  . Abnormal  stress test 07/18/2013  . PVD (peripheral vascular disease) ([place]) 07/12/2013  . S/P primary angioplasty with coronary stent 07/12/2013  . Diabetes 1.5, managed as type 2 ([place]) 07/12/2013  . Chronic diastolic heart failure ([place]) 08/27/2012  . Carotid bruit 08/27/2012  . Angina pectoris ([place]) 04/30/2012  . CAD (coronary artery disease) 04/28/2012  . Hx of non-ST elevation myocardial infarction (NSTEMI) 04/23/2012  .  Hypertension associated with diabetes ([place])   . Diabetes mellitus with peripheral vascular disease ([place])   . Hyperlipidemia associated with type 2 diabetes mellitus ([place])    PCP:  Sharion Balloon, FNP Pharmacy:   [facility] [address] [place] [address] [place] [address] Phone: [phone] Fax: [phone]  [place], [place], [address] [place], [place] [address] Phone: [phone] Fax: [phone]     Social Determinants of Health (SDOH) Interventions    Readmission Risk Interventions No flowsheet data found.

## 2020-06-28 NOTE — Care Management Important Message (Signed)
Important Message  Patient Details  Name: Leah Ferrell MRN: [id number] Date of Birth: [birth year]   Medicare Important Message Given:  Yes     [name] [address] Circle 06/28/2020, 2:36 PM

## 2020-06-28 NOTE — Progress Notes (Addendum)
[address]       [place] [address]             [phone]      3 Days Post-Op Procedure(s) (LRB): CORONARY ARTERY BYPASS GRAFTING (CABG)X3. LEFT INTERNAL MAMMARY ARTERY. RIGHT ENDOSCOPIC SAPHENOUS VEIN HARVESTING. (N/A) TRANSESOPHAGEAL ECHOCARDIOGRAM (TEE) (N/A) INDOCYANINE GREEN FLUORESCENCE IMAGING (ICG) (N/A) Subjective:  weak but slowly improving CIR monitoring to see if candidate- insurance is a stumbling block with UHC  Objective: Vital signs in last 24 hours: Temp:  [98 F (36.7 C)-98.6 F (37 C)] 98.1 F (36.7 C) (05/12 0403) Pulse Rate:  [79-80] 79 (05/12 0403) Cardiac Rhythm: Atrial paced (05/12 0704) Resp:  [13-27] 18 (05/12 0403) BP: (132-166)/(55-113) 153/62 (05/12 0403) SpO2:  [92 %-100 %] 99 % (05/12 0403) Arterial Line BP: (192)/(57) 192/57 (05/11 0900) FiO2 (%):  [32 %] 32 % (05/11 2016) Weight:  [102.4 kg] 102.4 kg (05/12 0504)  Hemodynamic parameters for last 24 hours:    Intake/Output from previous day: 05/11 0701 - 05/12 0700 In: 825.7 [P.O.:520; I.V.:305.7] Out: 8841 [Urine:4650; Chest Tube:389] Intake/Output this shift: No intake/output data recorded.  General appearance: alert, cooperative and no distress Heart: regular rate and rhythm Lungs: mildly dim in bases Abdomen: benign Extremities: + edema Wound: incis healing well  Lab Results: Recent Labs    06/27/20 0412 06/28/20 0117  WBC 21.0* 16.8*  HGB 8.5* 8.6*  HCT 25.9* 25.2*  PLT 160 165   BMET:  Recent Labs    06/27/20 0412 06/28/20 0117  NA 130* 129*  K 4.1 3.5  CL 99 93*  CO2 26 29  GLUCOSE 143* 112*  BUN 10 12  CREATININE 0.67 0.68  CALCIUM 7.4* 7.7*    PT/INR:  Recent Labs    06/25/20 1404  LABPROT 16.7*  INR 1.4*   ABG    Component Value Date/Time   PHART 7.357 06/25/2020 1823   HCO3 24.2 06/25/2020 1823   TCO2 25 06/25/2020 1823   ACIDBASEDEF 1.0 06/25/2020 1823   O2SAT 61.0 06/27/2020 0412   CBG (last 3)  Recent Labs     06/27/20 2026 06/27/20 2335 06/28/20 0405  GLUCAP 189* 126* 110*    Meds Scheduled Meds: . acetaminophen  1,000 mg Oral Q6H   Or  . acetaminophen (TYLENOL) oral liquid 160 mg/5 mL  1,000 mg Per Tube Q6H  . amLODipine  5 mg Oral Daily  . aspirin EC  81 mg Oral Daily  . bisacodyl  10 mg Oral Daily   Or  . bisacodyl  10 mg Rectal Daily  . Chlorhexidine Gluconate Cloth  6 each Topical Daily  . clopidogrel  75 mg Oral Daily  . colchicine  0.3 mg Oral BID  . docusate sodium  200 mg Oral Daily  . furosemide  40 mg Intravenous BID  . ibuprofen  400 mg Oral TID  . insulin aspart  0-24 Units Subcutaneous Q4H  . insulin aspart  10 Units Subcutaneous TID WC  . insulin detemir  24 Units Subcutaneous BID  . levalbuterol  0.63 mg Nebulization BID  . mouth rinse  15 mL Mouth Rinse BID  . metoCLOPramide (REGLAN) injection  5 mg Intravenous Q6H  . metoprolol tartrate  25 mg Oral BID   Or  . metoprolol tartrate  25 mg Per Tube BID  . pantoprazole  40 mg Oral Daily  . rosuvastatin  40 mg Oral Daily   Continuous Infusions: . insulin Stopped (06/27/20 0952)  .  lactated ringers     PRN Meds:.dextrose, hydrALAZINE, lactated ringers, levalbuterol, metoprolol tartrate, ondansetron (ZOFRAN) IV, oxyCODONE, traMADol  Xrays DG Chest Port 1 View  Result Date: 06/27/2020 CLINICAL DATA:  Shortness of breath EXAM: PORTABLE CHEST 1 VIEW COMPARISON:  06/27/2020 FINDINGS: Prior CABG. Cardiomegaly. Vascular congestion. Left chest tube in place without pneumothorax. Mild interstitial prominence may reflect interstitial edema. Bibasilar atelectasis. IMPRESSION: Cardiomegaly vascular congestion and possible mild interstitial edema. Bibasilar atelectasis.  No pneumothorax. Electronically Signed   By: Rolm Baptise M.D.   On: 06/27/2020 08:47   DG CHEST PORT 1 VIEW  Result Date: 06/27/2020 CLINICAL DATA:  70 year old female with respiratory distress. EXAM: PORTABLE CHEST 1 VIEW COMPARISON:  Chest radiograph  dated 06/26/2020. FINDINGS: Interval removal of the Swan-Ganz catheter. A right IJ central venous line is noted with tip over upper SVC. No pneumothorax. Bilateral chest tubes in similar position. Similar cardiomegaly and central vascular congestion. No new consolidation. Median sternotomy wires. No acute osseous pathology. IMPRESSION: 1. Interval removal of the Swan-Ganz catheter. 2. Right IJ central venous line with tip over upper SVC. No pneumothorax. 3. No interval change in the appearance of the lungs. Electronically Signed   By: Anner Crete M.D.   On: 06/27/2020 00:52    Assessment/Plan: S/P Procedure(s) (LRB): CORONARY ARTERY BYPASS GRAFTING (CABG)X3. LEFT INTERNAL MAMMARY ARTERY. RIGHT ENDOSCOPIC SAPHENOUS VEIN HARVESTING. (N/A) TRANSESOPHAGEAL ECHOCARDIOGRAM (TEE) (N/A) INDOCYANINE GREEN FLUORESCENCE IMAGING (ICG) (N/A)  1 afeb, SBP 130's to 160's, sat sgood on 3 liters Tool, apaced- sinus rhythm underneath in 70's- will disconnect pacer 2 mild hyponatremia- monitor, excellent UOP 3 weight up 3 kg- doubt accurate with > 3 liter diuresis. On 40 IV lasix BID currently- cont for today but may be able to decrease soon 4 CT - 389 cc /24h 5 CXR with some perihilar fullness 6 leukocytosis trend improved 7 H/H stable expected ABLA  8 d/c foley 9 push rehab and pulm toilet      LOS: 3 days    [name] PA-C Pager [phone] 06/28/2020 Pt seen and examined; agree with documentation; d/c chest tubes; continue diuresis [place] Z. Orvan Seen, [place]

## 2020-06-28 NOTE — Progress Notes (Signed)
D/c chest tube per order. Pt tolerated fair.   Lavenia Atlas, RN

## 2020-06-29 ENCOUNTER — Other Ambulatory Visit: Payer: Self-pay | Admitting: Cardiothoracic Surgery

## 2020-06-29 ENCOUNTER — Inpatient Hospital Stay (HOSPITAL_COMMUNITY): Payer: Medicare Other

## 2020-06-29 DIAGNOSIS — Z951 Presence of aortocoronary bypass graft: Secondary | ICD-10-CM

## 2020-06-29 LAB — GLUCOSE, CAPILLARY
Glucose-Capillary: 101 mg/dL — ABNORMAL HIGH (ref 70–99)
Glucose-Capillary: 109 mg/dL — ABNORMAL HIGH (ref 70–99)
Glucose-Capillary: 149 mg/dL — ABNORMAL HIGH (ref 70–99)
Glucose-Capillary: 159 mg/dL — ABNORMAL HIGH (ref 70–99)
Glucose-Capillary: 189 mg/dL — ABNORMAL HIGH (ref 70–99)
Glucose-Capillary: 272 mg/dL — ABNORMAL HIGH (ref 70–99)
Glucose-Capillary: 281 mg/dL — ABNORMAL HIGH (ref 70–99)

## 2020-06-29 MED ORDER — POTASSIUM CHLORIDE CRYS ER 20 MEQ PO TBCR
40.0000 meq | EXTENDED_RELEASE_TABLET | Freq: Every day | ORAL | Status: DC
  Administered 2020-06-29 – 2020-07-03 (×5): 40 meq via ORAL
  Filled 2020-06-29 (×5): qty 2

## 2020-06-29 MED ORDER — FUROSEMIDE 40 MG PO TABS
40.0000 mg | ORAL_TABLET | Freq: Every day | ORAL | Status: DC
  Administered 2020-06-29 – 2020-07-03 (×5): 40 mg via ORAL
  Filled 2020-06-29 (×5): qty 1

## 2020-06-29 NOTE — Progress Notes (Signed)
CARDIAC REHAB PHASE I   PRE:  Rate/Rhythm: 73 SR  BP:  Supine:   Sitting: 169/59  Standing:    SaO2: 96%RA  MODE:  Ambulation: 200 ft   POST:  Rate/Rhythm: 84 SR  BP:  Supine:   Sitting: 141/89  Standing:    SaO2: 95%RA 1334-1416 Pt walked 200 ft on RA with rolling walker and asst x 1. Tolerated well. No c/o dizziness and did not have difficulty gripping with right hand. Encouraged more walks today but pt hesitant. To bed after walk. Daughter in room.   Graylon Good, RN BSN  06/29/2020 2:13 PM

## 2020-06-29 NOTE — Progress Notes (Signed)
Physical Therapy Treatment Patient Details Name: Leah Ferrell MRN: [id number] DOB: 1950/04/26 Today's Date: 06/29/2020    History of Present Illness Pt is a 70 y/o female admitted 06/25/20 for CABG due to extensive cardiovascular and peripheral vascular calcific disease, recurrent disease in L coronary system. S/P CABG x 3 5/9. PMH includes CAD, CHF, DM, HTN, morbid obesity, NSTEMI, PVD, renal artery stensosis, cardiac cath x 2.   PT Comments    Pt progressing well with mobility. Today's session focused on continued transfer and gait training with RW, as well as incorporating sternal precautions into all mobility and ADL tasks; pt requires up to minA for mobility, intermittent verbal cues for techniques to maintain precautions. Continue to recommend HHPT services to maximize functional mobility and independence. Will follow acutely to address established goals.    Follow Up Recommendations  Home health PT;Supervision for mobility/OOB     Equipment Recommendations  Rolling walker with 5" wheels    Recommendations for Other Services       Precautions / Restrictions Precautions Precautions: Sternal;Fall Precaution Comments: Pt with improving awareness of sternal precautions; clarified bed mobility, including log roll and able to minimally push into sitting "within tube"    Mobility  Bed Mobility Overal bed mobility: Needs Assistance Bed Mobility: Rolling;Sidelying to Sit Rolling: Supervision Sidelying to sit: Min assist;HOB elevated       General bed mobility comments: Cues for log roll technique as pt trying to power into sitting with BLEs off EOB and unable to complete independently; able to roll with use of bed rail, light minA for trunk elevation from sidelying to sit    Transfers Overall transfer level: Needs assistance Equipment used: Rolling walker (2 wheeled) Transfers: Sit to/from Stand Sit to Stand: Min guard         General transfer comment: Multiple  sit<>stands from EOB, toilet and recliner to RW, pt with good recall to push with hands on knees with use of momentum (prefers to count to 3); min guard to supervision for balance  Ambulation/Gait Ambulation/Gait assistance: Min guard Gait Distance (Feet): 200 Feet Assistive device: Rolling walker (2 wheeled) Gait Pattern/deviations: Step-through pattern;Decreased stride length Gait velocity: Decreased   General Gait Details: Slow, guarded gait with RW and intermittent min guard for balance, mainly when pt stopping to cough and with turns   Marine scientist [place] (Stroke Patients Only)       Balance Overall balance assessment: Needs assistance Sitting-balance support: No upper extremity supported;Feet supported Sitting balance-[name] Scale: Fair Sitting balance - Comments: Able to perform pericare sitting on toilet without assist   Standing balance support: Bilateral upper extremity supported;During functional activity;No upper extremity supported Standing balance-[name] Scale: Fair Standing balance comment: Can static stand without UE support to hug pillow during coughing attempts, min guard for balance, likely unable to accept challenge                            Cognition Arousal/Alertness: Awake/alert Behavior During Therapy: WFL for tasks assessed/performed Overall Cognitive Status: No family/caregiver present to determine baseline cognitive functioning Area of Impairment: Attention;Following commands;Safety/judgement;Awareness;Problem solving                   Current Attention Level: Selective   Following Commands: Follows one step commands consistently Safety/Judgement: Decreased awareness of deficits Awareness: Emergent Problem Solving: Slow processing;Requires verbal cues  Exercises      General Comments        Pertinent Vitals/Pain Pain Assessment: Faces Faces Pain Scale: Hurts a little  bit Pain Location: Sternal incision with coughing Pain Descriptors / Indicators: Discomfort;Operative site guarding Pain Intervention(s): Monitored during session;Other (comment) (braced pillow to cough)    Home Living                      Prior Function            PT Goals (current goals can now be found in the care plan section) Progress towards PT goals: Progressing toward goals    Frequency    Min 3X/week      PT Plan Current plan remains appropriate    Co-evaluation              AM-PAC PT "6 Clicks" Mobility   Outcome Measure  Help needed turning from your back to your side while in a flat bed without using bedrails?: A Little Help needed moving from lying on your back to sitting on the side of a flat bed without using bedrails?: A Little Help needed moving to and from a bed to a chair (including a wheelchair)?: A Little Help needed standing up from a chair using your arms (e.g., wheelchair or bedside chair)?: A Little Help needed to walk in hospital room?: A Little Help needed climbing 3-5 steps with a railing? : A Little 6 Click Score: 18    End of Session   Activity Tolerance: Patient tolerated treatment well Patient left: in chair;with call bell/phone within reach Nurse Communication: Mobility status PT Visit Diagnosis: Unsteadiness on feet (R26.81);Muscle weakness (generalized) (M62.81);Pain     Time: 6222-9798 PT Time Calculation (min) (ACUTE ONLY): 25 min  Charges:  $Gait Training: 8-22 mins $Therapeutic Activity: 8-22 mins                     Mabeline Caras, PT, DPT Acute Rehabilitation Services  Pager [phone] Office [place] 06/29/2020, 5:30 PM

## 2020-06-29 NOTE — Progress Notes (Signed)
Occupational Therapy Treatment Patient Details Name: Leah Ferrell MRN: [id number] DOB: 1950/08/02 Today's Date: 06/29/2020    History of present illness Pt is a 70 y/o female admitted 06/25/20 for CABG due to extensive cardiovascular and peripheral vascular calcific disease, recurrent disease in L coronary system. S/P CABG x 3 5/9. PMH includes: CAD, CHF, DM, HTN, morbid obesity, NSTEMI, PVD, renal artery stensosis, cardiac cath x 2.   OT comments  Patient seated in recliner upon entry, agreeable to OT session.  Reviewed sternal precautions with pt, pt able to recall to not reach behind back and pushing.  Simulated LB dressing with max assist. Patient completing transfers with up to min assist, but once standing with RW requires cueing and assistance to place R UE on walker. R UE remains swollen, pt reports it feeling "less" than L UE and demonstrating difficulty with FMC/GMC.  RN notified and assessed pt--therapist discussed RUE concerns with RN.  Assisted back to bed to rest with min assist.  Pt continues to demonstrate difficulty with following multiple step commands, slow processing and initiation/task completion.  Will follow acutely. Updated dc plan to CIR to optimize independence and activity tolerance prior to dc home with support.    Follow Up Recommendations  CIR;Supervision/Assistance - 24 hour    Equipment Recommendations  3 in 1 bedside commode    Recommendations for Other Services Rehab consult    Precautions / Restrictions Precautions Precautions: Sternal;Fall Precaution Booklet Issued: No Precaution Comments: reviewed precautions with pt Restrictions Weight Bearing Restrictions: Yes (sternal precautions) Other Position/Activity Restrictions: sternal precautions       Mobility Bed Mobility Overal bed mobility: Needs Assistance Bed Mobility: Rolling;Sit to Sidelying Rolling: Min guard       Sit to sidelying: Min assist General bed mobility comments: min assist  for trunk support and guiding LB back to sidelying, increased time required    Transfers Overall transfer level: Needs assistance Equipment used: Rolling walker (2 wheeled) Transfers: Sit to/from Stand Sit to Stand: Min assist         General transfer comment: for safety/balance with cueing for precaution adherance and sequencing    Balance Overall balance assessment: Needs assistance Sitting-balance support: No upper extremity supported;Feet supported Sitting balance-[name] Scale: Fair Sitting balance - Comments: limited dynaimcally   Standing balance support: Bilateral upper extremity supported;During functional activity Standing balance-[name] Scale: Poor Standing balance comment: relies on BUE support                           ADL either performed or assessed with clinical judgement   ADL Overall ADL's : Needs assistance/impaired                     Lower Body Dressing: Maximal assistance;Sit to/from stand Lower Body Dressing Details (indicate cue type and reason): requires assist for socks, min guard sit to stand Toilet Transfer: Minimal assistance;Stand-pivot;RW Toilet Transfer Details (indicate cue type and reason): simulated to EOB         Functional mobility during ADLs: Minimal assistance;Rolling walker General ADL Comments: pt limited by weakness, decreased activity tolerance, cognition and precautions     Vision       Perception     Praxis      Cognition Arousal/Alertness: Awake/alert Behavior During Therapy: Flat affect Overall Cognitive Status: No family/caregiver present to determine baseline cognitive functioning Area of Impairment: Safety/judgement;Awareness;Problem solving;Following commands  Following Commands: Follows one step commands consistently;Follows one step commands with increased time Safety/Judgement: Decreased awareness of safety;Decreased awareness of deficits Awareness:  Emergent Problem Solving: Slow processing;Decreased initiation;Requires verbal cues;Difficulty sequencing General Comments: pt with fair recall of sternal precautions, slow processing and difficulty following mulitple step commands.        Exercises Exercises: General Upper Extremity General Exercises - Upper Extremity Shoulder Flexion: AROM;Both;10 reps;Seated Digit Composite Flexion: AROM;Both;10 reps;Seated Composite Extension: AROM;Both;10 reps;Seated   Shoulder Instructions       General Comments VSS during session on 1L via [place]; when standing during session pt voices "what happened to me" multiple times, and when reaching for RW with UE demonstrating difficulty processing task or finding handle with R UE (attempting to lift but missing handle towards L of handle mulitple times). After sitting down, pt reports "feeling different" and RN assessed pt.  Pt continues to require R UE "feels less" than L UE.    Pertinent Vitals/ Pain       Pain Assessment: Faces Faces Pain Scale: Hurts little more Pain Location: incisonal-chest Pain Descriptors / Indicators: Discomfort;Operative site guarding Pain Intervention(s): Limited activity within patient's tolerance;Monitored during session;Repositioned  Home Living                                          Prior Functioning/Environment              Frequency  Min 2X/week        Progress Toward Goals  OT Goals(current goals can now be found in the care plan section)  Progress towards OT goals: Progressing toward goals  Acute Rehab OT Goals Patient Stated Goal: to get home OT Goal Formulation: With patient  Plan Discharge plan needs to be updated;Frequency remains appropriate    Co-evaluation                 AM-PAC OT "6 Clicks" Daily Activity     Outcome Measure   Help from another person eating meals?: A Little Help from another person taking care of personal grooming?: A Little Help from  another person toileting, which includes using toliet, bedpan, or urinal?: A Lot Help from another person bathing (including washing, rinsing, drying)?: A Lot Help from another person to put on and taking off regular upper body clothing?: A Lot Help from another person to put on and taking off regular lower body clothing?: A Lot 6 Click Score: 14    End of Session Equipment Utilized During Treatment: Rolling walker;Oxygen  OT Visit Diagnosis: Other abnormalities of gait and mobility (R26.89);Muscle weakness (generalized) (M62.81);Pain;Other symptoms and signs involving cognitive function   Activity Tolerance Patient tolerated treatment well   Patient Left in bed;with call bell/phone within reach;with nursing/sitter in room   Nurse Communication Mobility status        Time: 7824-2353 OT Time Calculation (min): 29 min  Charges: OT General Charges $OT Visit: 1 Visit OT Treatments $Self Care/Home Management : 23-37 mins  Jolaine Artist, OT [place] Pager [phone] Office [place] 06/29/2020, 10:58 AM

## 2020-06-29 NOTE — Progress Notes (Signed)
[address]       [place] [address]             [phone]      4 Days Post-Op Procedure(s) (LRB): CORONARY ARTERY BYPASS GRAFTING (CABG)X3. LEFT INTERNAL MAMMARY ARTERY. RIGHT ENDOSCOPIC SAPHENOUS VEIN HARVESTING. (N/A) TRANSESOPHAGEAL ECHOCARDIOGRAM (TEE) (N/A) INDOCYANINE GREEN FLUORESCENCE IMAGING (ICG) (N/A) Subjective: conts to feel well, breathing is more comfortable and getting stronger  Objective: Vital signs in last 24 hours: Temp:  [97.6 F (36.4 C)-98.3 F (36.8 C)] 98.3 F (36.8 C) (05/13 0453) Pulse Rate:  [61-69] 66 (05/13 0453) Cardiac Rhythm: Normal sinus rhythm (05/13 0700) Resp:  [14-22] 20 (05/13 0657) BP: (129-160)/(47-58) 160/53 (05/13 0453) SpO2:  [97 %-100 %] 97 % (05/13 0453) Weight:  [95.1 kg] 95.1 kg (05/13 0657)  Hemodynamic parameters for last 24 hours:    Intake/Output from previous day: 05/12 0701 - 05/13 0700 In: 960 [P.O.:960] Out: 4130 [Urine:4100; Chest Tube:30] Intake/Output this shift: No intake/output data recorded.  General appearance: alert, cooperative and no distress Heart: regular rate and rhythm Lungs: clear to auscultation bilaterally Abdomen: benign Extremities: minor edema Wound: incis healing well  Lab Results: Recent Labs    06/27/20 0412 06/28/20 0117  WBC 21.0* 16.8*  HGB 8.5* 8.6*  HCT 25.9* 25.2*  PLT 160 165   BMET:  Recent Labs    06/27/20 0412 06/28/20 0117  NA 130* 129*  K 4.1 3.5  CL 99 93*  CO2 26 29  GLUCOSE 143* 112*  BUN 10 12  CREATININE 0.67 0.68  CALCIUM 7.4* 7.7*    PT/INR: No results for input(s): LABPROT, INR in the last 72 hours. ABG    Component Value Date/Time   PHART 7.357 06/25/2020 1823   HCO3 24.2 06/25/2020 1823   TCO2 25 06/25/2020 1823   ACIDBASEDEF 1.0 06/25/2020 1823   O2SAT 61.0 06/27/2020 0412   CBG (last 3)  Recent Labs    06/28/20 2006 06/29/20 0002 06/29/20 0451  GLUCAP 137* 101* 109*    Meds Scheduled Meds: .  acetaminophen  1,000 mg Oral Q6H   Or  . acetaminophen (TYLENOL) oral liquid 160 mg/5 mL  1,000 mg Per Tube Q6H  . amLODipine  5 mg Oral Daily  . aspirin EC  81 mg Oral Daily  . bisacodyl  10 mg Oral Daily   Or  . bisacodyl  10 mg Rectal Daily  . clopidogrel  75 mg Oral Daily  . colchicine  0.3 mg Oral BID  . docusate sodium  200 mg Oral Daily  . ferrous [id number] C-folic acid  1 capsule Oral BID PC  . furosemide  40 mg Intravenous BID  . ibuprofen  400 mg Oral TID  . insulin aspart  0-24 Units Subcutaneous Q4H  . insulin aspart  10 Units Subcutaneous TID WC  . insulin detemir  24 Units Subcutaneous BID  . levalbuterol  0.63 mg Nebulization BID  . mouth rinse  15 mL Mouth Rinse BID  . metoCLOPramide (REGLAN) injection  5 mg Intravenous Q6H  . metoprolol tartrate  25 mg Oral BID   Or  . metoprolol tartrate  25 mg Per Tube BID  . pantoprazole  40 mg Oral Daily  . rosuvastatin  40 mg Oral Daily   Continuous Infusions: PRN Meds:.dextrose, hydrALAZINE, levalbuterol, metoprolol tartrate, ondansetron (ZOFRAN) IV, oxyCODONE, traMADol  Xrays DG Chest Port 1 View  Result Date: 06/28/2020 CLINICAL DATA:  Status post open heart surgery Chest  tube EXAM: PORTABLE CHEST 1 VIEW COMPARISON:  06/27/2020 FINDINGS: Unchanged cardiomegaly. Interval removal of right IJ sheath. Bilateral chest tubes remain in place. No significant pneumothorax. Median sternotomy changes again seen. Retained epicardial leads also again identified. Minimal bibasilar atelectasis again seen. IMPRESSION: 1. Unchanged cardiomegaly 2. No pneumothorax Electronically Signed   By: Miachel Roux M.D.   On: 06/28/2020 07:32    Assessment/Plan: S/P Procedure(s) (LRB): CORONARY ARTERY BYPASS GRAFTING (CABG)X3. LEFT INTERNAL MAMMARY ARTERY. RIGHT ENDOSCOPIC SAPHENOUS VEIN HARVESTING. (N/A) TRANSESOPHAGEAL ECHOCARDIOGRAM (TEE) (N/A) INDOCYANINE GREEN FLUORESCENCE IMAGING (ICG) (N/A)  1 afeb, sBP 120's to 160'ssinus  rhythm 2 sats good on 1 liter 3 BS control is good 4 no new labs/CXR- repeat in am 5 conts to diurese well , weight down significantly , will reduce lasix dose 6 remove EPW's 7 poss home soon with HH v CIR , she has a daughter to stay with at d/c also    LOS: 4 days    [name] PA-C Pager [phone] 06/29/2020

## 2020-06-29 NOTE — Progress Notes (Signed)
Inpatient Diabetes Program Recommendations  AACE/ADA: New Consensus Statement on Inpatient Glycemic Control (2015)  Target Ranges:  Prepandial:   less than 140 mg/dL      Peak postprandial:   less than 180 mg/dL (1-2 hours)      Critically ill patients:  140 - 180 mg/dL   Lab Results  Component Value Date   GLUCAP 189 (H) 06/29/2020   HGBA1C 7.7 (H) 06/21/2020    Review of Glycemic Control  Diabetes history: DM2 Outpatient Diabetes medications: Fiasp 40-50 units TID, Ozempic 1.0 mg weekly Current orders for Inpatient glycemic control: Levemir 24 units BID, Novolog 0-24 units Q4H + 10 units TID with meals  HgbA1C - 7.7% CBGs today 159, 281, 189 mg/dL  Inpatient Diabetes Program Recommendations:     Change Novolog 0-24 units to TID with meals and 0-5 HS  Will need to go home on basal insulin - Levemir 24 units QD  F/U with PCP who manages pt's diabetes. Fiasp 120-150 units Q24H appears to be large dose. Would recommend basal AND bolus insulin for home.   Attempted to speak with pt several times via phone, but was not successful.  Continue to follow.  Thank you. Lorenda Peck, RD, LDN, CDE Inpatient Diabetes Coordinator [phone]

## 2020-06-29 NOTE — Discharge Instructions (Signed)

## 2020-06-29 NOTE — Progress Notes (Signed)
Inpatient Rehab Admissions Coordinator:   Note OT updated recommendations to CIR.  Attempted to speak to patient over the phone (working remotely today), but no answer.  Will continue to follow.   Shann Medal, PT, DPT Admissions Coordinator [phone] 06/29/20  12:59 PM

## 2020-06-29 NOTE — Progress Notes (Signed)
[place] to see pt at 0822. In bathroom. Asked that I return in 15 minutes. Returned and pt working with OT. Pt tired and dizzy after session and back to bed. Will continue to follow. Pt has PT also today and for pacing wire removal. Graylon Good RN BSN 06/29/2020 9:37 AM

## 2020-06-29 NOTE — Progress Notes (Signed)
EPW removed per order. Pt tolerated well. Wires intact. VSS and set to sequence. Pt informed of bedrest for 1 hour, all questions answered. Will continue to monitor.  Arletta Bale, RN

## 2020-06-30 LAB — CBC
HCT: 28.2 % — ABNORMAL LOW (ref 36.0–46.0)
Hemoglobin: 9.3 g/dL — ABNORMAL LOW (ref 12.0–15.0)
MCH: 31.8 pg (ref 26.0–34.0)
MCHC: 33 g/dL (ref 30.0–36.0)
MCV: 96.6 fL (ref 80.0–100.0)
Platelets: 303 10*3/uL (ref 150–400)
RBC: 2.92 MIL/uL — ABNORMAL LOW (ref 3.87–5.11)
RDW: 13.4 % (ref 11.5–15.5)
WBC: 11.5 10*3/uL — ABNORMAL HIGH (ref 4.0–10.5)
nRBC: 1.7 % — ABNORMAL HIGH (ref 0.0–0.2)

## 2020-06-30 LAB — TYPE AND SCREEN
ABO/RH(D): O POS
Antibody Screen: NEGATIVE
Unit division: 0
Unit division: 0

## 2020-06-30 LAB — GLUCOSE, CAPILLARY
Glucose-Capillary: 142 mg/dL — ABNORMAL HIGH (ref 70–99)
Glucose-Capillary: 145 mg/dL — ABNORMAL HIGH (ref 70–99)
Glucose-Capillary: 147 mg/dL — ABNORMAL HIGH (ref 70–99)
Glucose-Capillary: 159 mg/dL — ABNORMAL HIGH (ref 70–99)
Glucose-Capillary: 175 mg/dL — ABNORMAL HIGH (ref 70–99)
Glucose-Capillary: 198 mg/dL — ABNORMAL HIGH (ref 70–99)
Glucose-Capillary: 210 mg/dL — ABNORMAL HIGH (ref 70–99)

## 2020-06-30 LAB — BPAM RBC
Blood Product Expiration Date: 202206042359
Blood Product Expiration Date: 202206042359
ISSUE DATE / TIME: 202205091059
ISSUE DATE / TIME: 202205091059
Unit Type and Rh: 5100
Unit Type and Rh: 5100

## 2020-06-30 LAB — BASIC METABOLIC PANEL
Anion gap: 8 (ref 5–15)
BUN: 10 mg/dL (ref 8–23)
CO2: 32 mmol/L (ref 22–32)
Calcium: 9 mg/dL (ref 8.9–10.3)
Chloride: 94 mmol/L — ABNORMAL LOW (ref 98–111)
Creatinine, Ser: 0.62 mg/dL (ref 0.44–1.00)
GFR, Estimated: >60 mL/min (ref >60)
Glucose, Bld: 145 mg/dL — ABNORMAL HIGH (ref 70–99)
Potassium: 4 mmol/L (ref 3.5–5.1)
Sodium: 134 mmol/L — ABNORMAL LOW (ref 135–145)

## 2020-06-30 MED ORDER — LISINOPRIL 10 MG PO TABS
10.0000 mg | ORAL_TABLET | Freq: Two times a day (BID) | ORAL | Status: DC
  Administered 2020-06-30 – 2020-07-03 (×7): 10 mg via ORAL
  Filled 2020-06-30 (×7): qty 1

## 2020-06-30 MED ORDER — LISINOPRIL 10 MG PO TABS: 10.0000 mg | ORAL_TABLET | Freq: Every day | ORAL | Status: DC

## 2020-06-30 NOTE — Progress Notes (Addendum)
      [address]       [place] [address]             [phone]      5 Days Post-Op Procedure(s) (LRB): CORONARY ARTERY BYPASS GRAFTING (CABG)X3. LEFT INTERNAL MAMMARY ARTERY. RIGHT ENDOSCOPIC SAPHENOUS VEIN HARVESTING. (N/A) TRANSESOPHAGEAL ECHOCARDIOGRAM (TEE) (N/A) INDOCYANINE GREEN FLUORESCENCE IMAGING (ICG) (N/A) Subjective: Feels okay this morning, sternal dressing taken off and incision is healing well.   Objective: Vital signs in last 24 hours: Temp:  [98.1 F (36.7 C)-98.8 F (37.1 C)] 98.8 F (37.1 C) (05/14 0347) Pulse Rate:  [62-80] 62 (05/14 0347) Cardiac Rhythm: Normal sinus rhythm (05/13 2007) Resp:  [16-23] 16 (05/14 0347) BP: (148-170)/(30-59) 161/49 (05/14 0347) SpO2:  [93 %-98 %] 98 % (05/14 0347) Weight:  [95.6 kg] 95.6 kg (05/14 0347)     Intake/Output from previous day: 05/13 0701 - 05/14 0700 In: 250 [P.O.:250] Out: 100 [Urine:100] Intake/Output this shift: No intake/output data recorded.  General appearance: alert, cooperative and no distress Heart: regular rate and rhythm, S1, S2 normal, no murmur, click, rub or gallop Lungs: clear to auscultation bilaterally Abdomen: soft, non-tender; bowel sounds normal; no masses,  no organomegaly Extremities: extremities normal, atraumatic, no cyanosis or edema Wound: clean and dry  Lab Results: Recent Labs    06/28/20 0117 06/30/20 0129  WBC 16.8* 11.5*  HGB 8.6* 9.3*  HCT 25.2* 28.2*  PLT 165 303   BMET:  Recent Labs    06/28/20 0117 06/30/20 0129  NA 129* 134*  K 3.5 4.0  CL 93* 94*  CO2 29 32  GLUCOSE 112* 145*  BUN 12 10  CREATININE 0.68 0.62  CALCIUM 7.7* 9.0    PT/INR: No results for input(s): LABPROT, INR in the last 72 hours. ABG    Component Value Date/Time   PHART 7.357 06/25/2020 1823   HCO3 24.2 06/25/2020 1823   TCO2 25 06/25/2020 1823   ACIDBASEDEF 1.0 06/25/2020 1823   O2SAT 61.0 06/27/2020 0412   CBG (last 3)  Recent Labs     06/29/20 2006 06/29/20 2333 06/30/20 0349  GLUCAP 272* 149* 145*    Assessment/Plan: S/P Procedure(s) (LRB): CORONARY ARTERY BYPASS GRAFTING (CABG)X3. LEFT INTERNAL MAMMARY ARTERY. RIGHT ENDOSCOPIC SAPHENOUS VEIN HARVESTING. (N/A) TRANSESOPHAGEAL ECHOCARDIOGRAM (TEE) (N/A) INDOCYANINE GREEN FLUORESCENCE IMAGING (ICG) (N/A)  1. CV-NSR in the 70s, BP has been elevated. On home Norvasc. Will start half of home lisinopril dose-she was on it BID at home. She also has PRN hydralazine. 2. Pulm- tolerating room air with good saturation. Weaning as able. CXR yesterday stable. Xopenex treatments ordered 3. Renal-creatinine 0.62, electrolytes okay. Continue daily PO lasix 4. H and H 9.3/28.2, trending up as expected.  5. Endo-blood glucose has been moderately controlled  Plan: Working on better BP control. She is on room air with good oxygen saturation. She is a two person assist at times but did walk 3x yesterday in the halls. Making slow and steady progress.    LOS: 5 days    Elgie Collard 06/30/2020 Pt seen and examined; agree with documentation. Anticipate d/c 1st of week. Julieta Rogalski Z. Orvan Seen, [place]

## 2020-06-30 NOTE — Progress Notes (Signed)
CARDIAC REHAB PHASE I   PRE:  Rate/Rhythm: 74 SR  BP:  Sitting: 145/47      SaO2: 97 RA  MODE:  Ambulation: 200 ft   POST:  Rate/Rhythm: 79 SR  BP:  Sitting: 157/45    SaO2: 97 RA   Pt ambulated 271ft in hallway assist of one with front wheel walker. Pt denies CP, SOB, or dizziness. Pt returned to recliner. Encouraged continued ambulation and IS use. Pt requesting walker for home use. Will continue to follow.  [id number] Rufina Falco, RN BSN 06/30/2020 1:18 PM

## 2020-06-30 NOTE — Progress Notes (Signed)
Mobility Specialist - Progress Note   06/30/20 1541  Mobility  Activity Ambulated in hall  Level of Assistance Minimal assist, patient does 75% or more  Assistive Device Front wheel walker  Distance Ambulated (ft) 200 ft  Mobility Ambulated with assistance in hallway  Mobility Response Tolerated well  Mobility performed by Mobility specialist  $Mobility charge 1 Mobility   Pt min assist w/ verbal cues for bed mobility and to stand from bedside and toilet. She denied any lightheadedness that she experienced earlier today. Pt back in bed after walk, call bell at side. HR remained in 80s throughout.   Pricilla Handler Mobility Specialist Mobility Specialist Phone: [phone]

## 2020-06-30 NOTE — Progress Notes (Signed)
Attempt x1 to administer pt's breathing treatment. Pt is tied up at this time.

## 2020-07-01 ENCOUNTER — Inpatient Hospital Stay (HOSPITAL_COMMUNITY): Payer: Medicare Other

## 2020-07-01 LAB — GLUCOSE, CAPILLARY
Glucose-Capillary: 187 mg/dL — ABNORMAL HIGH (ref 70–99)
Glucose-Capillary: 152 mg/dL — ABNORMAL HIGH (ref 70–99)
Glucose-Capillary: 270 mg/dL — ABNORMAL HIGH (ref 70–99)
Glucose-Capillary: 193 mg/dL — ABNORMAL HIGH (ref 70–99)
Glucose-Capillary: 235 mg/dL — ABNORMAL HIGH (ref 70–99)

## 2020-07-01 NOTE — Progress Notes (Signed)
Mobility Specialist - Progress Note   07/01/20 1757  Mobility  Activity Ambulated in hall  Level of Assistance Standby assist, set-up cues, supervision of patient - no hands on  Assistive Device Front wheel walker  Distance Ambulated (ft) 240 ft  Mobility Ambulated with assistance in hallway  Mobility Response Tolerated well  Mobility performed by Mobility specialist  $Mobility charge 1 Mobility   Pt asx throughout ambulation. Pt back in bed after walk, call bell at side. VSS throughout.  Pricilla Handler Mobility Specialist Mobility Specialist Phone: [phone]

## 2020-07-01 NOTE — Progress Notes (Addendum)
Patient experienced new onset post-menopausal vaginal bleeding this morning while toileting.  Flow is mild and painless with no other symptoms. Per patient she is post-menopausal with last menstrual period at age 69.  Hemoglobin stable at 9.3 and on Plavix. Notified provider with recommendation given to continue Plavix.  Will monitor with Maxi pad count.

## 2020-07-01 NOTE — Progress Notes (Signed)
Patient continues to have spotting to light vaginal bleeding intermittently which is most noticeable while toileting.

## 2020-07-01 NOTE — Progress Notes (Addendum)
      [address]       [place] [address]             [phone]      6 Days Post-Op Procedure(s) (LRB): CORONARY ARTERY BYPASS GRAFTING (CABG)X3. LEFT INTERNAL MAMMARY ARTERY. RIGHT ENDOSCOPIC SAPHENOUS VEIN HARVESTING. (N/A) TRANSESOPHAGEAL ECHOCARDIOGRAM (TEE) (N/A) INDOCYANINE GREEN FLUORESCENCE IMAGING (ICG) (N/A) Subjective: She feels nauseated this morning. Awaiting Zofran.    Objective: Vital signs in last 24 hours: Temp:  [97.9 F (36.6 C)-98.7 F (37.1 C)] 97.9 F (36.6 C) (05/15 0335) Pulse Rate:  [72-78] 78 (05/15 0335) Cardiac Rhythm: Normal sinus rhythm (05/15 0335) Resp:  [15-20] 15 (05/15 0335) BP: (146-162)/(43-59) 162/54 (05/15 0335) SpO2:  [93 %-99 %] 99 % (05/15 0335) Weight:  [92.8 kg] 92.8 kg (05/15 0147)     Intake/Output from previous day: 05/14 0701 - 05/15 0700 In: 1710 [P.O.:1710] Out: 400 [Urine:400] Intake/Output this shift: No intake/output data recorded.  General appearance: alert, cooperative and no distress Heart: regular rate and rhythm, S1, S2 normal, no murmur, click, rub or gallop Lungs: clear to auscultation bilaterally Abdomen: soft, non-tender; bowel sounds normal; no masses,  no organomegaly Extremities: extremities normal, atraumatic, no cyanosis or edema Wound: clean and dry  Lab Results: Recent Labs    06/30/20 0129  WBC 11.5*  HGB 9.3*  HCT 28.2*  PLT 303   BMET:  Recent Labs    06/30/20 0129  NA 134*  K 4.0  CL 94*  CO2 32  GLUCOSE 145*  BUN 10  CREATININE 0.62  CALCIUM 9.0    PT/INR: No results for input(s): LABPROT, INR in the last 72 hours. ABG    Component Value Date/Time   PHART 7.357 06/25/2020 1823   HCO3 24.2 06/25/2020 1823   TCO2 25 06/25/2020 1823   ACIDBASEDEF 1.0 06/25/2020 1823   O2SAT 61.0 06/27/2020 0412   CBG (last 3)  Recent Labs    06/30/20 2056 06/30/20 2334 07/01/20 0338  GLUCAP 175* 198* 187*    Assessment/Plan: S/P Procedure(s) (LRB): CORONARY  ARTERY BYPASS GRAFTING (CABG)X3. LEFT INTERNAL MAMMARY ARTERY. RIGHT ENDOSCOPIC SAPHENOUS VEIN HARVESTING. (N/A) TRANSESOPHAGEAL ECHOCARDIOGRAM (TEE) (N/A) INDOCYANINE GREEN FLUORESCENCE IMAGING (ICG) (N/A)  1. CV-NSR in the 70s, BP has been elevated. On home Norvasc. Will start half of home lisinopril dose-she was on it BID at home. She also has PRN hydralazine. 2. Pulm- tolerating 2L [place] with good saturation. CXR stable with some post-op atelectasis. Xopenex treatments ordered  3. Renal-creatinine 0.62, electrolytes okay. Weight is down to baseline. Continue daily PO lasix 4. H and H 9.3/28.2, trending up as expected.  5. Endo-blood glucose has been moderately controlled 6. Vaginal bleeding-post-menopausal. Will get GYN consult   Plan: Slow and steady progress. Nauseated this morning so has not walked. Weaning oxygen as able. Hopeful for discharge early this week.    LOS: 6 days    Elgie Collard 07/01/2020

## 2020-07-02 LAB — GLUCOSE, CAPILLARY
Glucose-Capillary: 111 mg/dL — ABNORMAL HIGH (ref 70–99)
Glucose-Capillary: 116 mg/dL — ABNORMAL HIGH (ref 70–99)
Glucose-Capillary: 128 mg/dL — ABNORMAL HIGH (ref 70–99)
Glucose-Capillary: 137 mg/dL — ABNORMAL HIGH (ref 70–99)
Glucose-Capillary: 184 mg/dL — ABNORMAL HIGH (ref 70–99)
Glucose-Capillary: 197 mg/dL — ABNORMAL HIGH (ref 70–99)
Glucose-Capillary: 308 mg/dL — ABNORMAL HIGH (ref 70–99)
Glucose-Capillary: 86 mg/dL (ref 70–99)

## 2020-07-02 NOTE — Progress Notes (Signed)
Pt has some concern about her left shoulder pain since post surgery. Pain was controlled well with PRN Tramadol. Left arm had adequate strange and range of motion, denied numbness or tingling. Ambulated with one stand by assisted with walker, well tolerated. May discuss this issue with MD at am.  Hemodynamics stable. NSR on monitor. On room air SPO2 >92%-95%.  Pt also complained having headache, nausea and dizziness  when her CBG was 111-116 mg/dl tonight.  She requested apple juice one cup, able to rest well.  Sternal incision and right leg EVH were dry, clean and healing.   Pt stated she does not have any more vaginal bleed tonight. MD made aware per RN day shift reported that Pt had minimal vaginal bleeding on day shift yesterday. We will monitor.   Kennyth Lose, RN

## 2020-07-02 NOTE — Plan of Care (Signed)
  Problem: Education: Goal: Knowledge of General Education information will improve Description: Including pain rating scale, medication(s)/side effects and non-pharmacologic comfort measures Outcome: Progressing   Problem: Health Behavior/Discharge Planning: Goal: Ability to manage health-related needs will improve Outcome: Progressing   Problem: Clinical Measurements: Goal: Ability to maintain clinical measurements within normal limits will improve Outcome: Progressing Goal: Will remain free from infection Outcome: Progressing Goal: Diagnostic test results will improve Outcome: Progressing Goal: Respiratory complications will improve Outcome: Progressing Goal: Cardiovascular complication will be avoided Outcome: Progressing   Problem: Activity: Goal: Risk for activity intolerance will decrease Outcome: Progressing   Problem: Nutrition: Goal: Adequate nutrition will be maintained Outcome: Progressing   Problem: Coping: Goal: Level of anxiety will decrease Outcome: Progressing   Problem: Elimination: Goal: Will not experience complications related to bowel motility Outcome: Progressing Goal: Will not experience complications related to urinary retention Outcome: Progressing   Problem: Pain Managment: Goal: General experience of comfort will improve Outcome: Progressing   Problem: Safety: Goal: Ability to remain free from injury will improve Outcome: Progressing   Problem: Skin Integrity: Goal: Risk for impaired skin integrity will decrease Outcome: Progressing   Problem: Education: Goal: Will demonstrate proper wound care and an understanding of methods to prevent future damage Outcome: Progressing Goal: Knowledge of disease or condition will improve Outcome: Progressing Goal: Knowledge of the prescribed therapeutic regimen will improve Outcome: Progressing Goal: Individualized Educational Video(s) Outcome: Progressing   Problem: Activity: Goal: Risk for  activity intolerance will decrease Outcome: Progressing   Problem: Cardiac: Goal: Will achieve and/or maintain hemodynamic stability Outcome: Progressing   Problem: Clinical Measurements: Goal: Postoperative complications will be avoided or minimized Outcome: Progressing   Problem: Respiratory: Goal: Respiratory status will improve Outcome: Progressing   Problem: Skin Integrity: Goal: Wound healing without signs and symptoms of infection Outcome: Progressing Goal: Risk for impaired skin integrity will decrease Outcome: Progressing   Problem: Urinary Elimination: Goal: Ability to achieve and maintain adequate renal perfusion and functioning will improve Outcome: Progressing   Problem: Education: Goal: Will demonstrate proper wound care and an understanding of methods to prevent future damage Outcome: Progressing Goal: Knowledge of disease or condition will improve Outcome: Progressing Goal: Knowledge of the prescribed therapeutic regimen will improve Outcome: Progressing Goal: Individualized Educational Video(s) Outcome: Progressing   Problem: Activity: Goal: Risk for activity intolerance will decrease Outcome: Progressing   Problem: Cardiac: Goal: Will achieve and/or maintain hemodynamic stability Outcome: Progressing   Problem: Clinical Measurements: Goal: Postoperative complications will be avoided or minimized Outcome: Progressing   Problem: Respiratory: Goal: Respiratory status will improve Outcome: Progressing   Problem: Skin Integrity: Goal: Wound healing without signs and symptoms of infection Outcome: Progressing Goal: Risk for impaired skin integrity will decrease Outcome: Progressing   Problem: Urinary Elimination: Goal: Ability to achieve and maintain adequate renal perfusion and functioning will improve Outcome: Progressing   

## 2020-07-02 NOTE — Progress Notes (Signed)
Physical Therapy Treatment Patient Details Name: Leah Ferrell MRN: [id number] DOB: [birth year] Today's Date: 07/02/2020    History of Present Illness Pt is a 70 y/o female admitted 06/25/20 for CABG due to extensive cardiovascular and peripheral vascular calcific disease, recurrent disease in L coronary system. S/P CABG x 3 5/9. PMH includes CAD, CHF, DM, HTN, morbid obesity, NSTEMI, PVD, renal artery stensosis, cardiac cath x 2.    PT Comments    Pt received in supine, agreeable to therapy session and with good participation and tolerance for gait and stair training. Pt needing mostly Supervision and up to min guard for stair ascent/descent to simulate home entry. Pt only needing min cues for technique within sternal precautions and motivated to ambulate with mobility tech again later in day. Pt continues to benefit from PT services to progress toward functional mobility goals. Continue to recommend HHPT.   Follow Up Recommendations  Home health PT;Supervision for mobility/OOB (plans to go to daughter's home, dtr works from home)     Equipment Recommendations   (per pt she has access to adjustable RW)    Recommendations for Other Services       Precautions / Restrictions Precautions Precautions: Sternal;Fall Precaution Booklet Issued: Yes (comment) Precaution Comments: demo'd all sternal precautions for pt as pt cannot read educational handout, utilized pictures to increase carryover; pt able to state precautions prior to OOB Restrictions Weight Bearing Restrictions: No Other Position/Activity Restrictions: sternal precautions    Mobility  Bed Mobility Overal bed mobility: Needs Assistance Bed Mobility: Rolling;Sidelying to Sit;Sit to Sidelying Rolling: Supervision Sidelying to sit: Min assist;HOB elevated     Sit to sidelying: Min guard General bed mobility comments: MinA needed to elevate trunk into sittng from sidelying; with HOB flat and no rails able to perform log roll  with min guard    Transfers Overall transfer level: Needs assistance Equipment used: Rolling walker (2 wheeled) Transfers: Sit to/from Stand Sit to Stand: Supervision         General transfer comment: good hand placement generally, supervision for occasional cues for technique  Ambulation/Gait Ambulation/Gait assistance: Supervision Gait Distance (Feet): 250 Feet Assistive device: Rolling walker (2 wheeled) Gait Pattern/deviations: Step-through pattern;Decreased stride length Gait velocity: Decreased   General Gait Details: no LOB, good use of RW, Supervision for occasional cues for posture/keeping elbows tucked in.   Stairs Stairs: Yes Stairs assistance: Min guard Stair Management: One rail Right;Step to pattern;Forwards Number of Stairs: 3 General stair comments: ascending three 7" steps with forward facing then stepping backward down 3 steps with cues throughout for sequencing due to surgical RLE pain; no LOB   Wheelchair Mobility    Modified Rankin (Stroke Patients Only)       Balance Overall balance assessment: Needs assistance Sitting-balance support: No upper extremity supported;Feet supported Sitting balance-[name] Scale: Fair Sitting balance - Comments: sitting EOB   Standing balance support: Bilateral upper extremity supported;During functional activity Standing balance-[name] Scale: Poor Standing balance comment: BUE support for functional mobility                            Cognition Arousal/Alertness: Awake/alert Behavior During Therapy: WFL for tasks assessed/performed Overall Cognitive Status: No family/caregiver present to determine baseline cognitive functioning Area of Impairment: Attention;Safety/judgement;Awareness;Problem solving                   Current Attention Level: Selective   Following Commands: Follows one step commands consistently  Awareness: Emergent Problem Solving: Slow processing;Requires verbal  cues General Comments: overall WFL for simple tasks, min cues for improved technique to maintain precautions during mobility tasks      Exercises Other Exercises Other Exercises: supine BLE AROM: ankle pumps, LAQ, marching x5-10 reps ea    General Comments General comments (skin integrity, edema, etc.): HR 75-82 bpm with exertion; SpO2 93-95% on RA; pt denies dizziness with postural change and not otherwise asssesed.      Pertinent Vitals/Pain Pain Assessment: Faces Faces Pain Scale: Hurts a little bit Pain Location: Sternal incision with coughing and bed mobility Pain Descriptors / Indicators: Discomfort;Operative site guarding Pain Intervention(s): Monitored during session;Repositioned (pt using pillow for manual splinting with bed mobility)    Home Living                      Prior Function            PT Goals (current goals can now be found in the care plan section) Acute Rehab PT Goals Patient Stated Goal: to get home PT Goal Formulation: With patient Time For Goal Achievement: 07/11/20 Potential to Achieve Goals: Good Progress towards PT goals: Progressing toward goals    Frequency    Min 3X/week      PT Plan Current plan remains appropriate    Co-evaluation              AM-PAC PT "6 Clicks" Mobility   Outcome Measure  Help needed turning from your back to your side while in a flat bed without using bedrails?: A Little Help needed moving from lying on your back to sitting on the side of a flat bed without using bedrails?: A Little Help needed moving to and from a bed to a chair (including a wheelchair)?: A Little Help needed standing up from a chair using your arms (e.g., wheelchair or bedside chair)?: A Little Help needed to walk in hospital room?: A Little Help needed climbing 3-5 steps with a railing? : A Little 6 Click Score: 18    End of Session Equipment Utilized During Treatment: Gait belt Activity Tolerance: Patient tolerated  treatment well Patient left: with call bell/phone within reach;in bed;with bed alarm set Nurse Communication: Mobility status PT Visit Diagnosis: Unsteadiness on feet (R26.81);Muscle weakness (generalized) (M62.81);Pain     Time: 8182-9937 PT Time Calculation (min) (ACUTE ONLY): 19 min  Charges:  $Gait Training: 8-22 mins                     Erykah Lippert P., PTA Acute Rehabilitation Services Pager: [phone] Office: [place] 07/02/2020, 4:05 PM

## 2020-07-02 NOTE — Progress Notes (Addendum)
[address]       [place] [address]             [phone]      7 Days Post-Op Procedure(s) (LRB): CORONARY ARTERY BYPASS GRAFTING (CABG)X3. LEFT INTERNAL MAMMARY ARTERY. RIGHT ENDOSCOPIC SAPHENOUS VEIN HARVESTING. (N/A) TRANSESOPHAGEAL ECHOCARDIOGRAM (TEE) (N/A) INDOCYANINE GREEN FLUORESCENCE IMAGING (ICG) (N/A) Subjective: feels ok, some left shoulder discomfort, no more vaginal bleeding  Objective: Vital signs in last 24 hours: Temp:  [97.7 F (36.5 C)-98.3 F (36.8 C)] 98 F (36.7 C) (05/16 0314) Pulse Rate:  [70-80] 72 (05/16 0314) Cardiac Rhythm: Normal sinus rhythm (05/16 0314) Resp:  [16-20] 16 (05/16 0314) BP: (135-159)/(45-57) 135/45 (05/16 0314) SpO2:  [93 %-100 %] 94 % (05/16 0314) Weight:  [93.7 kg] 93.7 kg (05/16 0314)  Hemodynamic parameters for last 24 hours:    Intake/Output from previous day: 05/15 0701 - 05/16 0700 In: 980 [P.O.:980] Out: -  Intake/Output this shift: Total I/O In: 500 [P.O.:500] Out: -   General appearance: alert, cooperative and no distress Heart: regular rate and rhythm Lungs: mildly dim in bases Abdomen: benign Extremities: no edema Wound: incis healing well  Lab Results: Recent Labs    06/30/20 0129  WBC 11.5*  HGB 9.3*  HCT 28.2*  PLT 303   BMET:  Recent Labs    06/30/20 0129  NA 134*  K 4.0  CL 94*  CO2 32  GLUCOSE 145*  BUN 10  CREATININE 0.62  CALCIUM 9.0    PT/INR: No results for input(s): LABPROT, INR in the last 72 hours. ABG    Component Value Date/Time   PHART 7.357 06/25/2020 1823   HCO3 24.2 06/25/2020 1823   TCO2 25 06/25/2020 1823   ACIDBASEDEF 1.0 06/25/2020 1823   O2SAT 61.0 06/27/2020 0412   CBG (last 3)  Recent Labs    07/02/20 0009 07/02/20 0136 07/02/20 0317  GLUCAP 116* 111* 128*    Meds Scheduled Meds: . amLODipine  5 mg Oral Daily  . aspirin EC  81 mg Oral Daily  . bisacodyl  10 mg Oral Daily   Or  . bisacodyl  10 mg Rectal Daily  .  clopidogrel  75 mg Oral Daily  . docusate sodium  200 mg Oral Daily  . ferrous [id number] C-folic acid  1 capsule Oral BID PC  . furosemide  40 mg Oral Daily  . insulin aspart  0-24 Units Subcutaneous Q4H  . insulin aspart  10 Units Subcutaneous TID WC  . insulin detemir  24 Units Subcutaneous BID  . lisinopril  10 mg Oral BID  . mouth rinse  15 mL Mouth Rinse BID  . metoCLOPramide (REGLAN) injection  5 mg Intravenous Q6H  . metoprolol tartrate  25 mg Oral BID   Or  . metoprolol tartrate  25 mg Per Tube BID  . pantoprazole  40 mg Oral Daily  . potassium chloride  40 mEq Oral Daily  . rosuvastatin  40 mg Oral Daily   Continuous Infusions: PRN Meds:.dextrose, hydrALAZINE, levalbuterol, metoprolol tartrate, ondansetron (ZOFRAN) IV, oxyCODONE, traMADol  Xrays DG Chest 2 View  Result Date: 07/01/2020 CLINICAL DATA:  Encounter for open heart surgery. EXAM: CHEST - 2 VIEW COMPARISON:  06/29/2020 FINDINGS: Postop change from median sternotomy. Stable mild cardiac enlargement. No pleural effusion or edema. No airspace consolidation, atelectasis or pneumothorax. IMPRESSION: Status post CABG.  No complicating features identified. Electronically Signed   By: Kerby Moors M.D.   On:  07/01/2020 08:14    Assessment/Plan: S/P Procedure(s) (LRB): CORONARY ARTERY BYPASS GRAFTING (CABG)X3. LEFT INTERNAL MAMMARY ARTERY. RIGHT ENDOSCOPIC SAPHENOUS VEIN HARVESTING. (N/A) TRANSESOPHAGEAL ECHOCARDIOGRAM (TEE) (N/A) INDOCYANINE GREEN FLUORESCENCE IMAGING (ICG) (N/A)   1 afeb, sBP 130's to 150's, sinus rhythm 2 sats good on RA 3 no new labs 4 BS control is pretty good 5 she knows to f/u with primary /gyn for bleeding issues if cpnt, she has appt this month 6 she remains pretty weak but defin improved , if not a candidate for CIR could poss go home later today or tomorrow with HHPT/OT    LOS: 7 days    [name] PA-C Pager [phone] 07/02/2020 Pt seen and examined; agree with  documentation and plan. Kabella Cassidy Z. Orvan Seen, [place]

## 2020-07-02 NOTE — Progress Notes (Signed)
Occupational Therapy Treatment Patient Details Name: Leah Ferrell MRN: [id number] DOB: February 27, 1950 Today's Date: 07/02/2020    History of present illness Pt is a 70 y/o female admitted 06/25/20 for CABG due to extensive cardiovascular and peripheral vascular calcific disease, recurrent disease in L coronary system. S/P CABG x 3 5/9. PMH includes CAD, CHF, DM, HTN, morbid obesity, NSTEMI, PVD, renal artery stensosis, cardiac cath x 2.   OT comments  Pt making steady progress towards OT goals this session. Session focus on education related to maintaining sternal precautions during ADLs, pt cannot read educational handouts in [place] therefore extensive time spent making sure pt understood sternal precautions. Pt currently requires min guard assist for ADL transfers with RW and MIN A for bed mobility. Pt able to teach back all sternal precautions, demo'ed all compensatory methods for maintaining precautions during ADLs. In light of pt progress, updated DC recs as indicated below, will follow acutely per POC.    Follow Up Recommendations  Home health OT;Supervision/Assistance - 24 hour;Other (comment) (initial 24 hour supervision)    Equipment Recommendations  3 in 1 bedside commode    Recommendations for Other Services      Precautions / Restrictions Precautions Precautions: Sternal;Fall Precaution Booklet Issued: Yes (comment) Precaution Comments: demo'd all sternal precautions for pt as pt cannot read educational handout, utilized pictures to increase carryover Restrictions Weight Bearing Restrictions:  (Sternal precautions) Other Position/Activity Restrictions: sternal precautions       Mobility Bed Mobility Overal bed mobility: Needs Assistance Bed Mobility: Rolling;Sidelying to Sit Rolling: Supervision Sidelying to sit: Min assist;HOB elevated       General bed mobility comments: MIN A Needed to elevate trunk into sittng from sidelying    Transfers Overall transfer  level: Needs assistance Equipment used: Rolling walker (2 wheeled) Transfers: Sit to/from Stand Sit to Stand: Min guard         General transfer comment: minguard with increased attempts to rise from EOB, cues for hand placement when ascending<>descending into sitting surface    Balance Overall balance assessment: Needs assistance Sitting-balance support: No upper extremity supported;Feet supported Sitting balance-[name] Scale: Fair Sitting balance - Comments: sitting EOB   Standing balance support: Bilateral upper extremity supported;During functional activity Standing balance-[name] Scale: Poor Standing balance comment: BUE support for functional mobility                           ADL either performed or assessed with clinical judgement   ADL Overall ADL's : Needs assistance/impaired           Upper Body Bathing Details (indicate cue type and reason): education and demo provided on using compensatory methods when completing UB bathing tasks       Upper Body Dressing Details (indicate cue type and reason): demo'ed compensatory method of donning [place] shirt and button up shirt in order to maintain sternal precautions     Toilet Transfer: Stand-pivot;RW;Min guard;Cueing for safety Toilet Transfer Details (indicate cue type and reason): cues for hand placement during sit<>stand, increased effort to rise from EOB   Toileting - Clothing Manipulation Details (indicate cue type and reason): education provided on compensatory method for completing posterior pericare, discussed use of toilet tongs but pt would benefit from further demonstration with practice   Tub/Shower Transfer Details (indicate cue type and reason): pt reports her daughter has tub shower and she is going to bring her own seat of her house, pt seems to be describing a  shower seat, may benefit from demo of 3n1 in tub shower placed long ways Functional mobility during ADLs: Min guard;Rolling walker General  ADL Comments: session focus on extensive education on maintaining sternal precautions during ADLs as pt unable to read educational handout ( reads in [place])     Vision       Perception     Praxis      Cognition Arousal/Alertness: Awake/alert Behavior During Therapy: WFL for tasks assessed/performed Overall Cognitive Status: No family/caregiver present to determine baseline cognitive functioning                                 General Comments: overall WFL for simple tasks, pt able to teach back precautions after thorough explaination        Exercises     Shoulder Instructions       General Comments VSS, incision appears clean and dry    Pertinent Vitals/ Pain       Pain Assessment: No/denies pain  Home Living                                          Prior Functioning/Environment              Frequency  Min 2X/week        Progress Toward Goals  OT Goals(current goals can now be found in the care plan section)  Progress towards OT goals: Progressing toward goals  Acute Rehab OT Goals Patient Stated Goal: to get home OT Goal Formulation: With patient Time For Goal Achievement: 07/11/20 Potential to Achieve Goals: Good  Plan Discharge plan needs to be updated;Frequency needs to be updated    Co-evaluation                 AM-PAC OT "6 Clicks" Daily Activity     Outcome Measure   Help from another person eating meals?: None Help from another person taking care of personal grooming?: A Little Help from another person toileting, which includes using toliet, bedpan, or urinal?: A Little Help from another person bathing (including washing, rinsing, drying)?: A Little Help from another person to put on and taking off regular upper body clothing?: A Little Help from another person to put on and taking off regular lower body clothing?: A Little 6 Click Score: 19    End of Session Equipment Utilized During Treatment:  Rolling walker  OT Visit Diagnosis: Other abnormalities of gait and mobility (R26.89);Muscle weakness (generalized) (M62.81);Other symptoms and signs involving cognitive function   Activity Tolerance Patient tolerated treatment well   Patient Left in chair;with call bell/phone within reach   Nurse Communication Mobility status;Other (comment) (waiting on her insulin)        Time: 1127-1200 OT Time Calculation (min): 33 min  Charges: OT General Charges $OT Visit: 1 Visit OT Treatments $Self Care/Home Management : 23-37 mins  Harley Alto., COTA/L Acute Rehabilitation Services [place] 07/02/2020, 1:27 PM

## 2020-07-02 NOTE — Progress Notes (Signed)
CARDIAC REHAB PHASE I   PRE:  Rate/Rhythm: 78 SR  BP:  Supine: 128/50  Sitting:    Standing:     SaO2: 96%RA  MODE:  Ambulation: 240 ft   POST:  Rate/Rhythm: 88 SR  BP:  Supine:   Sitting: 175/44  Standing:    SaO2: 93%RA 1056-1130 Pt walked 240 ft on RA with rolling walker with little assistance. Reinforced sternal precautions. Pt stated she has walker at home. To bathroom and then to bed.    Graylon Good, RN BSN  07/02/2020 11:26 AM

## 2020-07-02 NOTE — Progress Notes (Addendum)
Inpatient Diabetes Program Recommendations  AACE/ADA: New Consensus Statement on Inpatient Glycemic Control (2015)  Target Ranges:  Prepandial:   less than 140 mg/dL      Peak postprandial:   less than 180 mg/dL (1-2 hours)      Critically ill patients:  140 - 180 mg/dL   Results for Leah Ferrell, Leah Ferrell (MRN [id number]) as of 07/02/2020 07:38  Ref. Range 06/30/2020 23:34 07/01/2020 03:38 07/01/2020 08:14 07/01/2020 11:52 07/01/2020 17:04 07/01/2020 20:02  Glucose-Capillary Latest Ref Range: 70 - 99 mg/dL 198 (H)  4 units NOVOLOG  187 (H)  4 units NOVOLOG  152 (H)  12 units NOVOLOG  270 (H)  22 units NOVOLOG  24 units LEVEMIR@ 10:41am  193 (H)  14 units NOVOLOG  235 (H)  8 units NOVOLOG  24 units LEVMEIR @8 :50pm    Results for Leah Ferrell, Leah Ferrell (MRN [id number]) as of 07/02/2020 07:38  Ref. Range 07/02/2020 00:09 07/02/2020 01:36 07/02/2020 03:17  Glucose-Capillary Latest Ref Range: 70 - 99 mg/dL 116 (H) 111 (H) 128 (H)    Home DM Meds: Fiasp 40-50 units tid with meals       Ozempic 1 mg weekly   Current Orders: Levemir 24 units BID       Novolog 0-24 units Q4H      Novolog 10 units TID with meals for meal coverage    MD- Please consider:  1. Change Novolog SSi coverage timing to TID AC + HS  2. Increase Novolog Meal Coverage to 13 units TID with meals  Likely will need addition of Levemir to home meds as well when discharged home with follow up with her PCP--Only taking rapid-acting insulin at home prior to admission   --Will follow patient during hospitalization--  Wyn Quaker RN, MSN, CDE Diabetes Coordinator Inpatient Glycemic Control Team Team Pager: [phone] (8a-5p)

## 2020-07-02 NOTE — Progress Notes (Signed)
Inpatient Rehab Admissions Coordinator:   Met with patient at the bedside during OT session.  Note PT changed recs back to home health and per OT, in agreement.  Will sign off for CIR at this time. Marvetta Gibbons, RN CM, aware.    Shann Medal, PT, DPT Admissions Coordinator [phone] 07/02/20  11:48 AM

## 2020-07-02 NOTE — TOC Progression Note (Signed)
Transition of Care (TOC) - Progression Note  Valentina Gu, BSN Transitions of Care Unit 4E- RN Case Manager See Treatment Team for direct phone #     Patient Details  Name: Leah Ferrell MRN: [id number] Date of Birth: Apr 23, 1950  Transition of Care [facility]) CM/SW Contact  Dahlia Client Romeo Rabon, RN Phone Number: 07/02/2020, 3:47 PM  Clinical Narrative:    Follow up done with pt at bedside for transition of care needs. Per pt she plans to go stay with daughter Leah Ferrell, pt voices that she is agreeable to [facility] services, reports she has a RW and cane at home, needs and is agreeable to 3n1. Discussed choice for [facility] and DME- needs pt reports she does not have a preference and defers to this writer to [facility] agency and is agreeable to using in house provider for DME.  Pt is not sure of daughter's address, gave verbal permission for this writer to reach out to daughter to confirm address for [facility] needs.   Call made to daughter Leah Ferrell, daughter confirmed plans for pt to come stay with her. [name] states she will be here in am around 9am to provide transportation home. [name] also request that d/c information be provided while she is present.  Address confirmed for Teresa's home as:   [address], [place] [address] Phone: [phone]  Call made to [facility] for DME need- 3n1 to be delivered to room prior to discharge.   Call made to [facility] (who has a working relationship with [facility] for [facility] needs) to see if they could service pt in [facility] at her daughter's home for needed HHPT/OT. [name] will check into this and get back with this writer on if referral is accepted or not. [facility] referral pending with [facility].    Expected Discharge Plan: [place] Barriers to Discharge: Continued Medical Work up  Expected Discharge Plan and Services Expected Discharge Plan: [place]   Discharge Planning Services: CM Consult Post Acute Care Choice:  [place] arrangements for the past 2 months: Single Family Home                 DME Arranged: 3-N-1 DME Agency: [facility] Date DME Agency Contacted: 07/02/20 Time DME Agency Contacted: 1430 Representative spoke with at DME Agency: Freda Munro [place]: PT,OT [place] Date [place]: 07/02/20 Time [place]: 1410 Representative spoke with at [place]: [place] ([place]) Interventions    Readmission Risk Interventions No flowsheet data found.

## 2020-07-03 ENCOUNTER — Ambulatory Visit: Payer: Medicare Other | Admitting: Cardiovascular Disease

## 2020-07-03 LAB — GLUCOSE, CAPILLARY
Glucose-Capillary: 151 mg/dL — ABNORMAL HIGH (ref 70–99)
Glucose-Capillary: 124 mg/dL — ABNORMAL HIGH (ref 70–99)
Glucose-Capillary: 294 mg/dL — ABNORMAL HIGH (ref 70–99)

## 2020-07-03 MED ORDER — ALBUTEROL SULFATE HFA 108 (90 BASE) MCG/ACT IN AERS: 1.0000 | INHALATION_SPRAY | Freq: Four times a day (QID) | RESPIRATORY_TRACT | Status: DC | PRN

## 2020-07-03 MED ORDER — OZEMPIC (0.25 OR 0.5 MG/DOSE) 2 MG/1.5ML ~~LOC~~ SOPN: 1.0000 mg | PEN_INJECTOR | SUBCUTANEOUS | Status: DC

## 2020-07-03 MED ORDER — TRAMADOL HCL 50 MG PO TABS: 50.0000 mg | ORAL_TABLET | Freq: Four times a day (QID) | ORAL | 0 refills | Status: AC | PRN

## 2020-07-03 MED ORDER — FIASP FLEXTOUCH 100 UNIT/ML ~~LOC~~ SOPN: 40.0000 [IU] | PEN_INJECTOR | Freq: Three times a day (TID) | SUBCUTANEOUS | Status: DC

## 2020-07-03 MED ORDER — HALOBETASOL PROPIONATE 0.05 % EX CREA
1.0000 | TOPICAL_CREAM | Freq: Every day | CUTANEOUS | Status: DC | PRN
Start: 2020-07-03 — End: 2023-04-06

## 2020-07-03 MED ORDER — CLOPIDOGREL BISULFATE 75 MG PO TABS: 75.0000 mg | ORAL_TABLET | Freq: Every day | ORAL | 1 refills | Status: DC

## 2020-07-03 MED ORDER — FE FUMARATE-B12-VIT C-FA-IFC PO CAPS: 1.0000 | ORAL_CAPSULE | Freq: Two times a day (BID) | ORAL | 1 refills | Status: DC

## 2020-07-03 NOTE — Plan of Care (Signed)
Problem: Education: Goal: Knowledge of General Education information will improve Description: Including pain rating scale, medication(s)/side effects and non-pharmacologic comfort measures 07/03/2020 1116 by Raelyn Number, RN Outcome: Adequate for Discharge 07/03/2020 1116 by Raelyn Number, RN Outcome: Adequate for Discharge 07/03/2020 1115 by Raelyn Number, RN Outcome: Progressing   Problem: Health Behavior/Discharge Planning: Goal: Ability to manage health-related needs will improve 07/03/2020 1116 by Raelyn Number, RN Outcome: Adequate for Discharge 07/03/2020 1116 by Raelyn Number, RN Outcome: Adequate for Discharge 07/03/2020 1115 by Raelyn Number, RN Outcome: Progressing   Problem: Clinical Measurements: Goal: Ability to maintain clinical measurements within normal limits will improve 07/03/2020 1116 by Raelyn Number, RN Outcome: Adequate for Discharge 07/03/2020 1116 by Raelyn Number, RN Outcome: Adequate for Discharge 07/03/2020 1115 by Raelyn Number, RN Outcome: Progressing Goal: Will remain free from infection 07/03/2020 1116 by Raelyn Number, RN Outcome: Adequate for Discharge 07/03/2020 1116 by Raelyn Number, RN Outcome: Adequate for Discharge 07/03/2020 1115 by Raelyn Number, RN Outcome: Progressing Goal: Diagnostic test results will improve 07/03/2020 1116 by Raelyn Number, RN Outcome: Adequate for Discharge 07/03/2020 1116 by Raelyn Number, RN Outcome: Adequate for Discharge 07/03/2020 1115 by Raelyn Number, RN Outcome: Progressing Goal: Respiratory complications will improve 07/03/2020 1116 by Raelyn Number, RN Outcome: Adequate for Discharge 07/03/2020 1116 by Raelyn Number, RN Outcome: Adequate for Discharge 07/03/2020 1115 by Raelyn Number, RN Outcome: Progressing Goal: Cardiovascular complication will be avoided 07/03/2020 1116 by Raelyn Number, RN Outcome: Adequate for Discharge 07/03/2020 1116 by Raelyn Number, RN Outcome: Adequate for Discharge 07/03/2020 1115 by Raelyn Number, RN Outcome: Progressing   Problem: Activity: Goal: Risk for activity intolerance will decrease 07/03/2020 1116 by Raelyn Number, RN Outcome: Adequate for Discharge 07/03/2020 1115 by Raelyn Number, RN Outcome: Progressing   Problem: Nutrition: Goal: Adequate nutrition will be maintained 07/03/2020 1116 by Raelyn Number, RN Outcome: Adequate for Discharge 07/03/2020 1115 by Raelyn Number, RN Outcome: Progressing   Problem: Coping: Goal: Level of anxiety will decrease 07/03/2020 1116 by Raelyn Number, RN Outcome: Adequate for Discharge 07/03/2020 1115 by Raelyn Number, RN Outcome: Progressing   Problem: Elimination: Goal: Will not experience complications related to bowel motility 07/03/2020 1116 by Raelyn Number, RN Outcome: Adequate for Discharge 07/03/2020 1115 by Raelyn Number, RN Outcome: Progressing Goal: Will not experience complications related to urinary retention 07/03/2020 1116 by Raelyn Number, RN Outcome: Adequate for Discharge 07/03/2020 1115 by Raelyn Number, RN Outcome: Progressing   Problem: Pain Managment: Goal: General experience of comfort will improve 07/03/2020 1116 by Raelyn Number, RN Outcome: Adequate for Discharge 07/03/2020 1115 by Raelyn Number, RN Outcome: Progressing   Problem: Safety: Goal: Ability to remain free from injury will improve 07/03/2020 1116 by Raelyn Number, RN Outcome: Adequate for Discharge 07/03/2020 1115 by Raelyn Number, RN Outcome: Progressing   Problem: Skin Integrity: Goal: Risk for impaired skin integrity will decrease 07/03/2020 1116 by Raelyn Number, RN Outcome: Adequate for Discharge  07/03/2020 1115 by Raelyn Number, RN Outcome: Progressing   Problem: Education: Goal: Will demonstrate proper wound care and an understanding of methods to prevent future damage 07/03/2020 1116 by Raelyn Number, RN Outcome: Adequate for Discharge  07/03/2020 1115 by Raelyn Number, RN Outcome: Progressing Goal: Knowledge of disease or condition will improve 07/03/2020 1116 by Raelyn Number, RN  Outcome: Adequate for Discharge  07/03/2020 1115 by Raelyn Number, RN Outcome: Progressing Goal: Knowledge of the prescribed therapeutic regimen will improve 07/03/2020 1116 by Raelyn Number, RN Outcome: Adequate for Discharge  07/03/2020 1115 by Raelyn Number, RN Outcome: Progressing Goal: Individualized Educational Video(s) 07/03/2020 1116 by Raelyn Number, RN Outcome: Adequate for Discharge  07/03/2020 1115 by Raelyn Number, RN Outcome: Progressing   Problem: Activity: Goal: Risk for activity intolerance will decrease 07/03/2020 1116 by Raelyn Number, RN Outcome: Adequate for Discharge  07/03/2020 1115 by Raelyn Number, RN Outcome: Progressing   Problem: Cardiac: Goal: Will achieve and/or maintain hemodynamic stability 07/03/2020 1116 by Raelyn Number, RN Outcome: Adequate for Discharge  07/03/2020 1115 by Raelyn Number, RN Outcome: Progressing   Problem: Clinical Measurements: Goal: Postoperative complications will be avoided or minimized 07/03/2020 1116 by Raelyn Number, RN Outcome: Adequate for Discharge  07/03/2020 1115 by Raelyn Number, RN Outcome: Progressing   Problem: Respiratory: Goal: Respiratory status will improve 07/03/2020 1116 by Raelyn Number, RN Outcome: Adequate for Discharge  07/03/2020 1115 by Raelyn Number, RN Outcome: Progressing   Problem: Skin Integrity: Goal: Wound healing without signs and symptoms of infection 07/03/2020 1116 by Raelyn Number, RN Outcome: Adequate for Discharge  07/03/2020 1115 by Raelyn Number, RN Outcome: Progressing Goal: Risk for impaired skin integrity will decrease 07/03/2020 1116 by Raelyn Number, RN Outcome: Adequate for Discharge  07/03/2020 1115 by Raelyn Number,  RN Outcome: Progressing   Problem: Urinary Elimination: Goal: Ability to achieve and maintain adequate renal perfusion and functioning will improve 07/03/2020 1116 by Raelyn Number, RN Outcome: Adequate for Discharge  07/03/2020 1115 by Raelyn Number, RN Outcome: Progressing   Problem: Education: Goal: Will demonstrate proper wound care and an understanding of methods to prevent future damage 07/03/2020 1116 by Raelyn Number, RN Outcome: Adequate for Discharge  07/03/2020 1115 by Raelyn Number, RN Outcome: Progressing Goal: Knowledge of disease or condition will improve 07/03/2020 1116 by Raelyn Number, RN Outcome: Adequate for Discharge  07/03/2020 1115 by Raelyn Number, RN Outcome: Progressing Goal: Knowledge of the prescribed therapeutic regimen will improve 07/03/2020 1116 by Raelyn Number, RN Outcome: Adequate for Discharge 07/03/2020 1115 by Raelyn Number, RN Outcome: Progressing Goal: Individualized Educational Video(s) 07/03/2020 1116 by Raelyn Number, RN Outcome: Adequate for Discharge  07/03/2020 1115 by Raelyn Number, RN Outcome: Progressing   Problem: Activity: Goal: Risk for activity intolerance will decrease 07/03/2020 1116 by Raelyn Number, RN Outcome: Adequate for Discharge  07/03/2020 1115 by Raelyn Number, RN Outcome: Progressing   Problem: Cardiac: Goal: Will achieve and/or maintain hemodynamic stability 07/03/2020 1116 by Raelyn Number, RN Outcome: Adequate for Discharge  07/03/2020 1115 by Raelyn Number, RN Outcome: Progressing   Problem: Clinical Measurements: Goal: Postoperative complications will be avoided or minimized 07/03/2020 1116 by Raelyn Number, RN Outcome: Adequate for Discharge  07/03/2020 1115 by Raelyn Number, RN Outcome: Progressing   Problem: Respiratory: Goal: Respiratory status will improve 07/03/2020 1116 by Raelyn Number, RN Outcome: Adequate for Discharge  07/03/2020 1115 by  Raelyn Number, RN Outcome: Progressing   Problem: Skin Integrity: Goal: Wound healing without signs and symptoms of infection 07/03/2020 1116 by Raelyn Number, RN Outcome: Adequate for Discharge  07/03/2020 1115 by Raelyn Number, RN Outcome: Progressing Goal: Risk for impaired skin integrity will decrease 07/03/2020 1116 by Doren Custard,  Ether Griffins, RN Outcome: Adequate for Discharge  07/03/2020 1115 by Raelyn Number, RN Outcome: Progressing   Problem: Urinary Elimination: Goal: Ability to achieve and maintain adequate renal perfusion and functioning will improve 07/03/2020 1116 by Raelyn Number, RN Outcome: Adequate for Discharge  07/03/2020 1115 by Raelyn Number, RN Outcome: Progressing

## 2020-07-03 NOTE — TOC Transition Note (Addendum)
Transition of Care (TOC) - CM/SW Discharge Note Marvetta Gibbons RN, BSN Transitions of Care Unit 4E- RN Case Manager See Treatment Team for direct phone #    Patient Details  Name: Leah Ferrell MRN: [id number] Date of Birth: 01-28-1951  Transition of Care [facility]) CM/SW Contact:  Dawayne Patricia, RN Phone Number: 07/03/2020, 3:25 PM   Clinical Narrative:    Pt stable for transition home today, f/u done with [name] at [facility] to see if they can service for [facility] needs- unfortunately they do not have the staffing available in the [place] area to service at this time.  Reached out to other agencies on pt's behalf: [facility]- unable to accept- no staffing in [employer]- unable to accept- no staffing in [employer]- unable to accept  [facility]- unable to accept- no staffing in [place] [employer]) [facility] [place] faxed- pending review [name]- msg left [facility]- spoke with [name]- they are able to accept for needed PT/OT services per [name] Day- however it will be a delayed for start of care until next week (by Wed)- call made to [name] with [facility] who will send needed into to [facility] for [facility] services. Call made to Daughter [name] who is agreeable to the delayed start of care. Also spoke with [name] - PA with [facility] who is ok with delayed start of care- noting that it is the best we can do in the patients current service area.      Final next level of care: [place] Barriers to Discharge: No [place] will accept this patient   Patient Goals and CMS Choice Patient states their goals for this hospitalization and ongoing recovery are:: return home with daughter CMS Medicare.gov Compare Post Acute Care list provided to:: Patient Choice offered to / list presented to : Patient  Discharge Placement                 Home with [facility]      Discharge Plan and  Services   Discharge Planning Services: CM Consult Post Acute Care Choice: Home Health,Durable Medical Equipment          DME Arranged: 3-N-1 DME Agency: [facility] Date DME Agency Contacted: 07/02/20 Time DME Agency Contacted: 7510 Representative spoke with at DME Agency: Freda Munro [place]: PT,OT [place] Date [place]: 07/02/20 Time Pablo Pena: 1410 Representative spoke with at [place]: [place] Interventions     Readmission Risk Interventions No flowsheet data found.

## 2020-07-03 NOTE — Progress Notes (Signed)
Mobility Specialist: Progress Note   07/03/20 1047  Mobility  Activity Refused mobility   Pt refused mobility stating she is prepping to discharge soon.   [facility] Leah Ferrell Mobility Specialist Mobility Specialist Phone: [phone]

## 2020-07-03 NOTE — Care Management Important Message (Signed)
Important Message  Patient Details  Name: Leah Ferrell MRN: [id number] Date of Birth: [birth year]   Medicare Important Message Given:  Yes     [name] 07/03/2020, 10:04 AM

## 2020-07-03 NOTE — Progress Notes (Signed)
      [address]       [place] [address]             [phone]      8 Days Post-Op Procedure(s) (LRB): CORONARY ARTERY BYPASS GRAFTING (CABG)X3. LEFT INTERNAL MAMMARY ARTERY. RIGHT ENDOSCOPIC SAPHENOUS VEIN HARVESTING. (N/A) TRANSESOPHAGEAL ECHOCARDIOGRAM (TEE) (N/A) INDOCYANINE GREEN FLUORESCENCE IMAGING (ICG) (N/A) Subjective: Feels fine  Objective: Vital signs in last 24 hours: Temp:  [97.9 F (36.6 C)-98.3 F (36.8 C)] 98 F (36.7 C) (05/17 0307) Pulse Rate:  [68-82] 68 (05/17 0307) Cardiac Rhythm: Normal sinus rhythm (05/17 0350) Resp:  [14-20] 14 (05/17 0307) BP: (126-175)/(44-54) 128/48 (05/17 0307) SpO2:  [92 %-97 %] 97 % (05/17 0307) Weight:  [94.8 kg] 94.8 kg (05/17 0307)  Hemodynamic parameters for last 24 hours:    Intake/Output from previous day: 05/16 0701 - 05/17 0700 In: 240 [P.O.:240] Out: -  Intake/Output this shift: No intake/output data recorded.  General appearance: alert, cooperative and no distress Heart: regular rate and rhythm Lungs: min dim in bases Abdomen: benign Extremities: no edema Wound: healing well  Lab Results: No results for input(s): WBC, HGB, HCT, PLT in the last 72 hours. BMET: No results for input(s): NA, K, CL, CO2, GLUCOSE, BUN, CREATININE, CALCIUM in the last 72 hours.  PT/INR: No results for input(s): LABPROT, INR in the last 72 hours. ABG    Component Value Date/Time   PHART 7.357 06/25/2020 1823   HCO3 24.2 06/25/2020 1823   TCO2 25 06/25/2020 1823   ACIDBASEDEF 1.0 06/25/2020 1823   O2SAT 61.0 06/27/2020 0412   CBG (last 3)  Recent Labs    07/02/20 1956 07/02/20 2322 07/03/20 0310  GLUCAP 197* 86 151*    Meds Scheduled Meds: . amLODipine  5 mg Oral Daily  . aspirin EC  81 mg Oral Daily  . bisacodyl  10 mg Oral Daily   Or  . bisacodyl  10 mg Rectal Daily  . clopidogrel  75 mg Oral Daily  . docusate sodium  200 mg Oral Daily  . ferrous [id number] C-folic acid  1  capsule Oral BID PC  . furosemide  40 mg Oral Daily  . insulin aspart  0-24 Units Subcutaneous Q4H  . insulin aspart  10 Units Subcutaneous TID WC  . insulin detemir  24 Units Subcutaneous BID  . lisinopril  10 mg Oral BID  . mouth rinse  15 mL Mouth Rinse BID  . metoCLOPramide (REGLAN) injection  5 mg Intravenous Q6H  . metoprolol tartrate  25 mg Oral BID   Or  . metoprolol tartrate  25 mg Per Tube BID  . pantoprazole  40 mg Oral Daily  . potassium chloride  40 mEq Oral Daily  . rosuvastatin  40 mg Oral Daily   Continuous Infusions: PRN Meds:.dextrose, hydrALAZINE, levalbuterol, metoprolol tartrate, ondansetron (ZOFRAN) IV, oxyCODONE, traMADol  Xrays No results found.  Assessment/Plan: S/P Procedure(s) (LRB): CORONARY ARTERY BYPASS GRAFTING (CABG)X3. LEFT INTERNAL MAMMARY ARTERY. RIGHT ENDOSCOPIC SAPHENOUS VEIN HARVESTING. (N/A) TRANSESOPHAGEAL ECHOCARDIOGRAM (TEE) (N/A) INDOCYANINE GREEN FLUORESCENCE IMAGING (ICG) (N/A)   1 afeb, VSS 2 sats good on RA 3 weight below preop- will stop lasix 4 BS variable- will return top preop meds- she will need good long term f/u 5 stable for d/c- with [place]       LOS: 8 days    [name] PA-C Pager [phone] 07/03/2020

## 2020-07-03 NOTE — Progress Notes (Signed)
Patient d/c from unit. Discharge instructions given and education completed. Chest tube sutures removed and secured with steri-strips.

## 2020-07-03 NOTE — Discharge Summary (Signed)
Physician Discharge Summary  Patient ID: CHALET KERWIN MRN: [id number] DOB/AGE: [birth year] 70 y.o.  Admit date: 06/25/2020 Discharge date: 07/03/2020  Admission Diagnoses: Severe multivessel coronary artery disease  Discharge Diagnoses:  Active Problems:   S/P CABG x 3  Patient Active Problem List   Diagnosis Date Noted  . 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 Critical lower limb ischemia ([place]) 05/09/2020  . Bilateral renal artery stenosis ([place])   . 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 Hypertensive urgency 12/13/2018  . Osteopenia 04/27/2018  . Hypoglycemia 12/02/2017  . Acute on chronic diastolic CHF (congestive heart failure) ([place]) 12/02/2017  . Dizziness 12/01/2017  . Morbid obesity ([place]) 06/10/2017  . PMB (postmenopausal bleeding) 11/12/2015  . Hypokalemia 11/23/2014  . Leg pain, left 07/06/2014  . Vaginal atrophy 05/08/2014  . Left leg swelling 01/17/2014  . PAD (peripheral artery disease) ([place]) 10/18/2013  . Abnormal stress test 07/18/2013  . PVD (peripheral vascular disease) ([place]) 07/12/2013  . S/P primary angioplasty with coronary stent 07/12/2013  . Diabetes 1.5, managed as type 2 ([place]) 07/12/2013  . Chronic diastolic heart failure ([place]) 08/27/2012  . Carotid bruit 08/27/2012  . Angina pectoris ([place]) 04/30/2012  . CAD (coronary artery disease) 04/28/2012  . Hx of non-ST elevation myocardial infarction (NSTEMI) 04/23/2012  . Hypertension associated with diabetes ([place])   . Diabetes mellitus with peripheral vascular disease ([place])   . Hyperlipidemia associated with type 2 diabetes mellitus [facility])     Referring Provider is No ref. provider found Primary Cardiologist is [name], MD PCP is Sharion Balloon, FNP   History of present illness:  The patient is a 70 year old female who was referred to Dr. Orvan Seen for consideration of CABG.  She has an extensive history of cardiac and peripheral vascular disease.  She has undergone multiple stenting procedures with most  recent being bilateral iliac artery stents.  She has also had previous DES to LAD in 2014.  She has also had a previous stent to the RCA.  She recently underwent cardiac catheterization in March 2022 and was found to have restenosis in the LAD as well as significant circumflex disease.  She was noted to have preserved LV function.  Dr. Orvan Seen evaluated the patient as an outpatient including all pertinent studies and recommended proceeding with elective CABG.  She was admitted this hospitalization for the procedure.     Hospital course:  The patient was admitted on 06/25/2020 and taken the operating room at which time she underwent CABG x3.  She tolerated the procedure well and was taken to the surgical intensive care unit in stable condition.  Postoperative hospital course:   The patient was extubated using standard post cardiac surgical protocols.  She is neurologically intact.  She did require some postoperative support including milrinone which was weaned over time.  All routine lines, monitors and drainage devices have been discontinued in the standard fashion.  She does have an expected acute blood loss anemia which has stabilized.  Renal function has remained within normal limits.  He is maintaining sinus rhythm.  Oxygen has been weaned and she maintains good saturations on room air.  Incisions are noted to be healing well without evidence of infection.  She has had some vaginal bleeding that quickly resolved of uncertain etiology.  She does note she will need follow-up with his as an outpatient with her primary and/or gynecologist.  Blood sugars have been under good control and she will be gradually transition to home medications at discharge.  Physical and Occupational Therapy recommend home therapy and this is being arranged.  She is not felt to be a candidate at this time for CIR. at the time of discharge the patient is felt to be quite stable.   Consults: None  Significant Diagnostic Studies:  Routine serial postoperative labs/chest x-rays  Treatments: Proceduressurgery        CARDIOTHORACIC SURGERY OPERATIVE NOTE  Date of Procedure:    06/25/2020  Preoperative Diagnosis:      Severe 3-vessel Coronary Artery Disease  Postoperative Diagnosis:    Same  Procedure:        Coronary Artery Bypass Grafting x 3              Left Internal Mammary Artery to Distal Left Anterior Descending Coronary Artery; Saphenous Vein Graft to first obtuse Marginal Branch of Left Circumflex Coronary Artery, Sapheonous Vein Graft to  Diagonal Branch Coronary Artery, Endoscopic Vein Harvest from right thigh and Lower Leg Completion graft surveillance with indocyanine green fluorescence angiography Epiaortic ultrasonography  Surgeon:        B.  Murvin Natal, MD  Assistant:       Evonnie Pat, PA-C  Anesthesia:    General  Operative Findings: ? Preserved left ventricular systolic function ? Good quality left internal mammary artery conduit ? Good quality saphenous vein conduit ? Good quality target vessels for grafting         Discharge Exam: Blood pressure (!) 135/42, pulse 86, temperature 98.4 F (36.9 C), temperature source Oral, resp. rate 14, height 5' 2.5" (1.588 m), weight 94.8 kg, SpO2 97 %.  General appearance: alert, cooperative and no distress Heart: regular rate and rhythm Lungs: min dim in bases Abdomen: benign Extremities: no edema Wound: healing well  Discharge Instructions    Amb Referral to Cardiac Rehabilitation   Complete by: As directed    Diagnosis: CABG   CABG X ___: 3   After initial evaluation and assessments completed: Virtual Based Care may be provided alone or in conjunction with Phase 2 Cardiac Rehab based on patient barriers.: Yes   Discharge patient   Complete by: As directed    Discharge disposition: 01-Home or Self Care   Discharge patient date: 07/03/2020      Discharge Medications:  Allergies as of 07/03/2020      Reactions   Tape Rash       Medication List    STOP taking these medications   cephALEXin 500 MG capsule Commonly known as: KEFLEX   nitroGLYCERIN 0.4 MG SL tablet Commonly known as: NITROSTAT   spironolactone 25 MG tablet Commonly known as: ALDACTONE     TAKE these medications   acetaminophen 325 MG tablet Commonly known as: TYLENOL Take 2 tablets (650 mg total) by mouth every 6 (six) hours as needed for mild pain (or Fever >/= 101).   albuterol 108 (90 Base) MCG/ACT inhaler Commonly known as: Proventil HFA Inhale 1-2 puffs into the lungs every 6 (six) hours as needed for wheezing or shortness of breath.   amLODipine 5 MG tablet Commonly known as: NORVASC Take 1 tablet (5 mg total) by mouth daily.   aspirin EC 81 MG tablet Take 81 mg by mouth daily. In the morning   blood glucose meter kit and supplies Dispense based on patient and insurance preference. Use up to four times daily as directed. (FOR ICD-10 E10.9, E11.9).   Caltrate 600+D Plus Minerals 600-800 MG-UNIT Tabs Take 1 tablet by mouth 3 (three) times daily.  carvedilol 6.25 MG tablet Commonly known as: COREG Take 1 tablet by mouth twice daily What changed: when to take this   clopidogrel 75 MG tablet Commonly known as: PLAVIX Take 1 tablet (75 mg total) by mouth daily.   ferrous [id number] C-folic acid capsule Commonly known as: TRINSICON / FOLTRIN Take 1 capsule by mouth 2 (two) times daily after a meal.   Fiasp FlexTouch 100 UNIT/ML FlexTouch Pen Generic drug: insulin aspart Inject 40-50 Units into the skin 3 (three) times daily before meals.   fish oil-omega-3 fatty acids 1000 MG capsule Take 1 g by mouth daily.   Glucose Meter Test test strip Generic drug: glucose blood Use 6-10 times a day, Uses ReliOn   halobetasol 0.05 % cream Commonly known as: ULTRAVATE Apply 1 application topically daily as needed (For rash).   hydroxypropyl methylcellulose / hypromellose 2.5 % ophthalmic solution Commonly  known as: ISOPTO TEARS / GONIOVISC Place 1 drop into both eyes 3 (three) times daily as needed (for dry eyes).   Insulin Syringe 27G X 1/2" 0.5 ML Misc Use with Fiasp insulin TID   lisinopril 20 MG tablet Commonly known as: ZESTRIL Take 1 tablet (20 mg total) by mouth 2 (two) times daily.   montelukast 10 MG tablet Commonly known as: SINGULAIR Take 1 tablet by mouth once daily with breakfast What changed:   how much to take  how to take this  when to take this  additional instructions   Ozempic (0.25 or 0.5 MG/DOSE) 2 MG/1.5ML Sopn Generic drug: Semaglutide(0.25 or 0.5MG/DOS) Inject 1 mg into the skin once a week. Friday Notes to patient: ONLY ONCE A WEEK ON FRIDAYS   ReliOn Pen Needles 29G X 12MM Misc Generic drug: Insulin Pen Needle USE 1 NEEDLE TO INJECT INSULIN SUBCUTANEOUSLY 3 TIMES DAILY What changed: See the new instructions.   BD ULTRA-FINE PEN NEEDLES 29G X 12.7MM Misc Generic drug: Insulin Pen Needle USE PENNEEDLES 3 TIMES  DAILY AS DIRECTED What changed: Another medication with the same name was changed. Make sure you understand how and when to take each.   rosuvastatin 40 MG tablet Commonly known as: CRESTOR Take 1 tablet (40 mg total) by mouth daily.   traMADol 50 MG tablet Commonly known as: ULTRAM Take 1 tablet (50 mg total) by mouth every 6 (six) hours as needed for up to 7 days for moderate pain.   vitamin C 1000 MG tablet Take 1,000 mg by mouth 3 (three) times daily.   Vitamin D-3 125 MCG (5000 UT) Tabs Take 5,000 Units by mouth daily.            Durable Medical Equipment  (From admission, onward)         Start     Ordered   06/28/20 0913  For home use only DME 3 n 1  Once        06/28/20 0912          Follow Up Appointments:   Follow-up Information    Wonda Olds, MD. Go on 07/04/2020.   Specialty: Cardiothoracic Surgery Why: Your appointment is at 3:30 PM. Please arrive 30 minutes early for chest x-ray to be performed  by [facility] located on the first floor of the same building Contact information: [place] [address] [phone]        Loel Dubonnet, NP. Go on 07/13/2020.   Specialty: Cardiology Why: Your appointment is at 10:15 AM. Contact information: [address]  [address] [place] [address] [phone]        [facility] Follow up.   Why: 3n1 arranged- to be delivered to room prior to discharge Contact information: [address] [place] [address] [phone]             The patient has been discharged on:   1.Beta Blocker:  Yes Blue.Reese   ]                              No   [   ]                              If No, reason:  2.Ace Inhibitor/ARB: Yes Blue.Reese   ]                                     No  [    ]                                     If No, reason:  3.Statin:   Yes [  y ]                  No  [   ]                  If No, reason:  4.Ecasa:  Yes  [ y  ]                  No   [   ]                  If No, reason:  Signed: [name] PA-C 07/03/2020, 12:26 PM

## 2020-07-03 NOTE — Progress Notes (Signed)
Occupational Therapy Treatment Patient Details Name: Leah Ferrell MRN: [id number] DOB: [birth year] Today's Date: 07/03/2020    History of present illness Pt is a 70 y/o female admitted 06/25/20 for CABG due to extensive cardiovascular and peripheral vascular calcific disease, recurrent disease in L coronary system. S/P CABG x 3 5/9. PMH includes CAD, CHF, DM, HTN, morbid obesity, NSTEMI, PVD, renal artery stensosis, cardiac cath x 2.   OT comments  Session focus on reviewing all education from yesterday related to maintaining sternal precautions during ADLs. Pt able to state all sternal precautions and additionally able to return demonstrate precautions during ADLS. Pt currently is able to complete sit<>stand form EOB and toilet and complete household distance functional mobility with RW MOD I. Education provided on AE for posterior pericare in order to maintain sternal precautions, pt very receptive and appreciative of education. Pt likely to DC home with daughter later today, will follow acutely per POC.   Follow Up Recommendations  Home health OT;Supervision/Assistance - 24 hour;Other (comment) (initial 24 hour supervision)    Equipment Recommendations  3 in 1 bedside commode    Recommendations for Other Services      Precautions / Restrictions Precautions Precautions: Sternal;Fall Precaution Booklet Issued: Yes (comment) Precaution Comments: pt able to state all sternal precaution education from yesterday and able to return demonstrate precautions functionally during ADLs Restrictions Weight Bearing Restrictions: No Other Position/Activity Restrictions: sternal precautions       Mobility Bed Mobility Overal bed mobility: Needs Assistance Bed Mobility: Rolling;Sidelying to Sit Rolling: Supervision Sidelying to sit: Min assist;HOB elevated       General bed mobility comments: MinA needed to elevate trunk into sittng from sidelying    Transfers Overall transfer level:  Needs assistance Equipment used: Rolling walker (2 wheeled) Transfers: Sit to/from Stand Sit to Stand: Modified independent (Device/Increase time)         General transfer comment: MOD I from EOB and toilet with good hand placement    Balance Overall balance assessment: Needs assistance Sitting-balance support: No upper extremity supported;Feet supported Sitting balance-[name] Scale: Fair Sitting balance - Comments: sitting EOB   Standing balance support: No upper extremity supported;During functional activity Standing balance-[name] Scale: Fair Standing balance comment: standing to wash hands                           ADL either performed or assessed with clinical judgement   ADL Overall ADL's : Needs assistance/impaired           Upper Body Bathing Details (indicate cue type and reason): pt able to state compensatory methods for UB bathing task       Upper Body Dressing Details (indicate cue type and reason): pt able to state compensatory method for UB dressing from yesterday     Toilet Transfer: Midwife Details (indicate cue type and reason): supervision for safety, good safety awareness Toileting- Clothing Manipulation and Hygiene: Supervision/safety;Sitting/lateral lean Toileting - Clothing Manipulation Details (indicate cue type and reason): anterior pericare from sitting via lateral leans, education and demo provided on toileting tongs for posterior pericare. pt also able to state compensatory method for posterior pericare     Functional mobility during ADLs: Modified independent;Rolling walker General ADL Comments: session focus on reviewing all sternal precaution education from yesterday as well as education related to AE for posterior pericare.     Vision       Perception     Praxis  Cognition Arousal/Alertness: Awake/alert Behavior During Therapy: WFL for tasks  assessed/performed Overall Cognitive Status: Within Functional Limits for tasks assessed                                 General Comments: overall WFL for simple tasks, good carryover of education from yesterday        Exercises     Shoulder Instructions       General Comments VSS on RA    Pertinent Vitals/ Pain       Pain Assessment: Faces Faces Pain Scale: No hurt  Home Living                                          Prior Functioning/Environment              Frequency  Min 2X/week        Progress Toward Goals  OT Goals(current goals can now be found in the care plan section)  Progress towards OT goals: Progressing toward goals  Acute Rehab OT Goals Patient Stated Goal: to go home today OT Goal Formulation: With patient Time For Goal Achievement: 07/11/20 Potential to Achieve Goals: Good  Plan Discharge plan remains appropriate;Frequency remains appropriate    Co-evaluation                 AM-PAC OT "6 Clicks" Daily Activity     Outcome Measure   Help from another person eating meals?: None Help from another person taking care of personal grooming?: None Help from another person toileting, which includes using toliet, bedpan, or urinal?: None Help from another person bathing (including washing, rinsing, drying)?: A Little Help from another person to put on and taking off regular upper body clothing?: None Help from another person to put on and taking off regular lower body clothing?: A Little 6 Click Score: 22    End of Session Equipment Utilized During Treatment: Rolling walker  OT Visit Diagnosis: Other abnormalities of gait and mobility (R26.89);Muscle weakness (generalized) (M62.81);Other symptoms and signs involving cognitive function   Activity Tolerance Patient tolerated treatment well   Patient Left in chair;with call bell/phone within reach   Nurse Communication Mobility status        Time:  5093-2671 OT Time Calculation (min): 19 min  Charges: OT General Charges $OT Visit: 1 Visit OT Treatments $Self Care/Home Management : 8-22 mins  Harley Alto., COTA/L Acute Rehabilitation Services [phone]    Precious Haws 07/03/2020, 8:28 AM

## 2020-07-03 NOTE — Progress Notes (Signed)
2111-5520 Education completed with pt and daughter who voiced understanding. Discussed sternal precautions and staying in the tube. Encouraged IS and gave walking instructions for ex. Gave diabetic and heart healthy diets. Discussed wound care. Discussed CRP 2 and referred to [place]. Program can contact pt per her phone since pt will be staying with daughter.  Graylon Good RN BSN 07/03/2020 10:16 AM

## 2020-07-04 ENCOUNTER — Ambulatory Visit: Payer: Self-pay | Admitting: Cardiothoracic Surgery

## 2020-07-05 DIAGNOSIS — I2510 Atherosclerotic heart disease of native coronary artery without angina pectoris: Secondary | ICD-10-CM | POA: Diagnosis not present

## 2020-07-05 DIAGNOSIS — I5032 Chronic diastolic (congestive) heart failure: Secondary | ICD-10-CM | POA: Diagnosis not present

## 2020-07-05 DIAGNOSIS — Z48812 Encounter for surgical aftercare following surgery on the circulatory system: Secondary | ICD-10-CM | POA: Diagnosis not present

## 2020-07-05 DIAGNOSIS — E1151 Type 2 diabetes mellitus with diabetic peripheral angiopathy without gangrene: Secondary | ICD-10-CM | POA: Diagnosis not present

## 2020-07-05 DIAGNOSIS — I739 Peripheral vascular disease, unspecified: Secondary | ICD-10-CM | POA: Diagnosis not present

## 2020-07-05 DIAGNOSIS — I110 Hypertensive heart disease with heart failure: Secondary | ICD-10-CM | POA: Diagnosis not present

## 2020-07-06 ENCOUNTER — Telehealth: Payer: Self-pay | Admitting: Family

## 2020-07-06 ENCOUNTER — Telehealth: Payer: Self-pay | Admitting: *Deleted

## 2020-07-06 DIAGNOSIS — I2510 Atherosclerotic heart disease of native coronary artery without angina pectoris: Secondary | ICD-10-CM | POA: Diagnosis not present

## 2020-07-06 DIAGNOSIS — I110 Hypertensive heart disease with heart failure: Secondary | ICD-10-CM | POA: Diagnosis not present

## 2020-07-06 DIAGNOSIS — I739 Peripheral vascular disease, unspecified: Secondary | ICD-10-CM | POA: Diagnosis not present

## 2020-07-06 DIAGNOSIS — E1151 Type 2 diabetes mellitus with diabetic peripheral angiopathy without gangrene: Secondary | ICD-10-CM | POA: Diagnosis not present

## 2020-07-06 DIAGNOSIS — I5032 Chronic diastolic (congestive) heart failure: Secondary | ICD-10-CM | POA: Diagnosis not present

## 2020-07-06 DIAGNOSIS — Z48812 Encounter for surgical aftercare following surgery on the circulatory system: Secondary | ICD-10-CM | POA: Diagnosis not present

## 2020-07-06 NOTE — Telephone Encounter (Signed)
Leah Ferrell contacted the office stating her blood pressure today was 114/ 70 or 80. She has concerns that this is too low. Advised patient this is a great BP. Cautioned patient to stand up slowly if she feels lightheaded. Advised patient to continue to take all medications as prescribed. Patient verbalizes understanding.

## 2020-07-06 NOTE — Telephone Encounter (Signed)
Pt is wanting to get more ozempic sample. Please call when ready

## 2020-07-06 NOTE — Telephone Encounter (Signed)
Patient aware and verbalized understanding. °

## 2020-07-10 DIAGNOSIS — I110 Hypertensive heart disease with heart failure: Secondary | ICD-10-CM | POA: Diagnosis not present

## 2020-07-10 DIAGNOSIS — I739 Peripheral vascular disease, unspecified: Secondary | ICD-10-CM | POA: Diagnosis not present

## 2020-07-10 DIAGNOSIS — I5032 Chronic diastolic (congestive) heart failure: Secondary | ICD-10-CM | POA: Diagnosis not present

## 2020-07-10 DIAGNOSIS — I2510 Atherosclerotic heart disease of native coronary artery without angina pectoris: Secondary | ICD-10-CM | POA: Diagnosis not present

## 2020-07-10 DIAGNOSIS — Z48812 Encounter for surgical aftercare following surgery on the circulatory system: Secondary | ICD-10-CM | POA: Diagnosis not present

## 2020-07-10 DIAGNOSIS — E1151 Type 2 diabetes mellitus with diabetic peripheral angiopathy without gangrene: Secondary | ICD-10-CM | POA: Diagnosis not present

## 2020-07-12 ENCOUNTER — Other Ambulatory Visit: Payer: Self-pay

## 2020-07-12 ENCOUNTER — Ambulatory Visit (INDEPENDENT_AMBULATORY_CARE_PROVIDER_SITE_OTHER): Payer: Self-pay | Admitting: Cardiothoracic Surgery

## 2020-07-12 ENCOUNTER — Ambulatory Visit
Admission: RE | Admit: 2020-07-12 | Discharge: 2020-07-12 | Disposition: A | Discharge status: Home / Self Care | Payer: Medicare Other | Source: Ambulatory Visit | Attending: Cardiothoracic Surgery | Admitting: Cardiothoracic Surgery

## 2020-07-12 VITALS — BP 126/72 | HR 79 | Resp 20 | Ht 62.5 in | Wt 200.0 lb

## 2020-07-12 DIAGNOSIS — Z951 Presence of aortocoronary bypass graft: Secondary | ICD-10-CM | POA: Diagnosis not present

## 2020-07-12 DIAGNOSIS — I739 Peripheral vascular disease, unspecified: Secondary | ICD-10-CM | POA: Diagnosis not present

## 2020-07-12 DIAGNOSIS — E1151 Type 2 diabetes mellitus with diabetic peripheral angiopathy without gangrene: Secondary | ICD-10-CM | POA: Diagnosis not present

## 2020-07-12 DIAGNOSIS — I2510 Atherosclerotic heart disease of native coronary artery without angina pectoris: Secondary | ICD-10-CM

## 2020-07-12 DIAGNOSIS — Z48812 Encounter for surgical aftercare following surgery on the circulatory system: Secondary | ICD-10-CM | POA: Diagnosis not present

## 2020-07-12 DIAGNOSIS — I5032 Chronic diastolic (congestive) heart failure: Secondary | ICD-10-CM | POA: Diagnosis not present

## 2020-07-12 DIAGNOSIS — I700 Atherosclerosis of aorta: Secondary | ICD-10-CM | POA: Diagnosis not present

## 2020-07-12 DIAGNOSIS — I110 Hypertensive heart disease with heart failure: Secondary | ICD-10-CM | POA: Diagnosis not present

## 2020-07-12 NOTE — Progress Notes (Signed)
[address]       Home,[place] [address]             [phone]     CARDIOTHORACIC SURGERY OFFICE NOTE  Referring Provider is Wellington Hampshire, MD Primary Cardiologist is [name], MD PCP is Sharion Balloon, FNP   HPI:  70 year old lady presents for initial postoperative visit status post CABG.  She did well after surgery and was discharged just a few days later.  She has no complaints today except her appetite remains less than normal.  She denies chest pain shortness of breath or fevers.   Current Outpatient Medications  Medication Sig Dispense Refill  . acetaminophen (TYLENOL) 325 MG tablet Take 2 tablets (650 mg total) by mouth every 6 (six) hours as needed for mild pain (or Fever >/= 101). 20 tablet 0  . albuterol (PROVENTIL HFA) 108 (90 Base) MCG/ACT inhaler Inhale 1-2 puffs into the lungs every 6 (six) hours as needed for wheezing or shortness of breath.    Marland Kitchen amLODipine (NORVASC) 5 MG tablet Take 1 tablet (5 mg total) by mouth daily. 90 tablet 2  . Ascorbic Acid (VITAMIN C) 1000 MG tablet Take 1,000 mg by mouth 3 (three) times daily.     Marland Kitchen aspirin EC 81 MG tablet Take 81 mg by mouth daily. In the morning    . BD ULTRA-FINE PEN NEEDLES 29G X 12.7MM MISC USE PENNEEDLES 3 TIMES  DAILY AS DIRECTED 270 each 1  . blood glucose meter kit and supplies Dispense based on patient and insurance preference. Use up to four times daily as directed. (FOR ICD-10 E10.9, E11.9). 1 each 0  . Calcium Carbonate-Vit D-Min (CALTRATE 600+D PLUS MINERALS) 600-800 MG-UNIT TABS Take 1 tablet by mouth 3 (three) times daily.     . carvedilol (COREG) 6.25 MG tablet Take 1 tablet by mouth twice daily (Patient taking differently: Take 6.25 mg by mouth 2 (two) times daily with a meal.) 60 tablet 3  . Cholecalciferol (VITAMIN D-3) 125 MCG (5000 UT) TABS Take 5,000 Units by mouth daily.    . clopidogrel (PLAVIX) 75 MG tablet Take 1 tablet (75 mg total) by mouth daily. 30 tablet 1  .  ferrous [id number] C-folic acid (TRINSICON / FOLTRIN) capsule Take 1 capsule by mouth 2 (two) times daily after a meal. 60 capsule 1  . fish oil-omega-3 fatty acids 1000 MG capsule Take 1 g by mouth daily.     Marland Kitchen glucose blood (GLUCOSE METER TEST) test strip Use 6-10 times a day, Uses ReliOn 900 each 12  . halobetasol (ULTRAVATE) 0.05 % cream Apply 1 application topically daily as needed (For rash).    . hydroxypropyl methylcellulose (ISOPTO TEARS) 2.5 % ophthalmic solution Place 1 drop into both eyes 3 (three) times daily as needed (for dry eyes).    . insulin aspart (FIASP FLEXTOUCH) 100 UNIT/ML FlexTouch Pen Inject 40-50 Units into the skin 3 (three) times daily before meals.    . Insulin Syringe 27G X 1/2" 0.5 ML MISC Use with Fiasp insulin TID 100 each 3  . lisinopril (ZESTRIL) 20 MG tablet Take 1 tablet (20 mg total) by mouth 2 (two) times daily. 60 tablet 5  . montelukast (SINGULAIR) 10 MG tablet Take 1 tablet by mouth once daily with breakfast (Patient taking differently: Take 10 mg by mouth daily.) 90 tablet 2  . RELION PEN NEEDLES 29G X 12MM MISC USE 1 NEEDLE TO INJECT INSULIN SUBCUTANEOUSLY 3 TIMES  DAILY (Patient taking differently: 1 Device by Subconjunctival route 3 (three) times daily.) 50 each 5  . rosuvastatin (CRESTOR) 40 MG tablet Take 1 tablet (40 mg total) by mouth daily. 90 tablet 3  . Semaglutide,0.25 or 0.5MG/DOS, (OZEMPIC, 0.25 OR 0.5 MG/DOSE,) 2 MG/1.5ML SOPN Inject 1 mg into the skin once a week. Friday     No current facility-administered medications for this visit.      Physical Exam:   BP 126/72   Pulse 79   Resp 20   Ht 5' 2.5" (1.588 m)   Wt 90.7 kg   SpO2 99% Comment: RA  BMI 36.00 kg/m   General:  Well-appearing no acute distress  Chest:   Clear to auscultation bilaterally  CV:   Regular rate and rhythm  Incisions:  Intact and healing well  Abdomen:  Soft nontender mildly distended  Extremities:  No edema chronic vascular changes  Diagnostic  Tests:  Chest x-ray from today is personally reviewed which shows clear lung fields and stable mediastinal structures   Impression:  Doing well after CABG  Plan:  Follow-up as needed May stop iron supplementation Continue other medications as prescribed  I spent in excess of 15 minutes during the conduct of this office consultation and >50% of this time involved direct face-to-face encounter with the patient for counseling and/or coordination of their care.  Level 2                 10 minutes Level 3                 15 minutes Level 4                 25 minutes Level 5                 40 minutes  B.  Murvin Natal, MD 07/12/2020 11:39 AM

## 2020-07-13 ENCOUNTER — Encounter: Payer: Self-pay | Admitting: Family

## 2020-07-13 ENCOUNTER — Ambulatory Visit (INDEPENDENT_AMBULATORY_CARE_PROVIDER_SITE_OTHER): Payer: Medicare Other | Admitting: Family

## 2020-07-13 VITALS — BP 114/58 | HR 77 | Ht 62.54 in | Wt 195.4 lb

## 2020-07-13 DIAGNOSIS — Z951 Presence of aortocoronary bypass graft: Secondary | ICD-10-CM

## 2020-07-13 DIAGNOSIS — I25118 Atherosclerotic heart disease of native coronary artery with other forms of angina pectoris: Secondary | ICD-10-CM

## 2020-07-13 DIAGNOSIS — I152 Hypertension secondary to endocrine disorders: Secondary | ICD-10-CM

## 2020-07-13 DIAGNOSIS — E1159 Type 2 diabetes mellitus with other circulatory complications: Secondary | ICD-10-CM

## 2020-07-13 DIAGNOSIS — I10 Essential (primary) hypertension: Secondary | ICD-10-CM | POA: Diagnosis not present

## 2020-07-13 DIAGNOSIS — I739 Peripheral vascular disease, unspecified: Secondary | ICD-10-CM | POA: Diagnosis not present

## 2020-07-13 DIAGNOSIS — E782 Mixed hyperlipidemia: Secondary | ICD-10-CM

## 2020-07-13 LAB — BASIC METABOLIC PANEL
BUN/Creatinine Ratio: 11 — ABNORMAL LOW (ref 12–28)
BUN: 11 mg/dL (ref 8–27)
CO2: 25 mmol/L (ref 20–29)
Calcium: 10.2 mg/dL (ref 8.7–10.3)
Chloride: 91 mmol/L — ABNORMAL LOW (ref 96–106)
Creatinine, Ser: 0.97 mg/dL (ref 0.57–1.00)
Glucose: 194 mg/dL — ABNORMAL HIGH (ref 65–99)
Potassium: 4.2 mmol/L (ref 3.5–5.2)
Sodium: 131 mmol/L — ABNORMAL LOW (ref 134–144)
eGFR: 63 mL/min/{1.73_m2} (ref >59)

## 2020-07-13 LAB — CBC
Hematocrit: 35.1 % (ref 34.0–46.6)
Hemoglobin: 11.4 g/dL (ref 11.1–15.9)
MCH: 31.6 pg (ref 26.6–33.0)
MCHC: 32.5 g/dL (ref 31.5–35.7)
MCV: 97 fL (ref 79–97)
Platelets: 524 10*3/uL — ABNORMAL HIGH (ref 150–450)
RBC: 3.61 x10E6/uL — ABNORMAL LOW (ref 3.77–5.28)
RDW: 13.8 % (ref 11.7–15.4)
WBC: 10.3 10*3/uL (ref 3.4–10.8)

## 2020-07-13 NOTE — Patient Instructions (Addendum)
Medication Instructions:  Continue your current medications.   *If you need a refill on your cardiac medications before your next appointment, please call your pharmacy*  Lab Work: Your provider recommends lab work today: CBC, BMP  If you have labs (blood work) drawn today and your tests are completely normal, you will receive your results only by: Marland Kitchen MyChart Message (if you have MyChart) OR . A paper copy in the mail If you have any lab test that is abnormal or we need to change your treatment, we will call you to review the results.   Testing/Procedures: Your EKG today showed normal sinus rhythm.    Follow-Up: At [facility], you and your health needs are our priority.  As part of our continuing mission to provide you with exceptional heart care, we have created designated Provider Care Teams.  These Care Teams include your primary Cardiologist (physician) and Advanced Practice Providers (APPs -  Physician Assistants and Nurse Practitioners) who all work together to provide you with the care you need, when you need it.  We recommend signing up for the patient portal called "MyChart".  Sign up information is provided on this After Visit Summary.  MyChart is used to connect with patients for Virtual Visits (Telemedicine).  Patients are able to view lab/test results, encounter notes, upcoming appointments, etc.  Non-urgent messages can be sent to your provider as well.   To learn more about what you can do with MyChart, go to [email or web address].    Your next appointment:   As scheduled with Dr. Fletcher [name] in July  Other Instructions  Keep up the good work with staying in your "tube" with your sternal precautions for 4-6 weeks after your open heart surgery. Keep up the good work with physical therapy!  If your blood pressure is consistently less than 110/60 please call our office.   Blood Pressure Log   Date   Time  Blood Pressure  Example: Nov 1 9 AM 124/78                                              To prevent or reduce lower extremity swelling: . Eat a low salt diet. Salt makes the body hold onto extra fluid which causes swelling. . Sit with legs elevated. For example, in the recliner or on an [place].  . Wear knee-high compression stockings during the daytime. Ones labeled 15-20 mmHg provide good compression.  Exercise recommendations: The American Heart Association recommends 150 minutes of moderate intensity exercise weekly. Try 30 minutes of moderate intensity exercise 4-5 times per week. This could include walking, jogging, or swimming.  Heart Healthy Diet Recommendations: A low-salt diet is recommended. Meats should be grilled, baked, or boiled. Avoid fried foods. Focus on lean protein sources like fish or chicken with vegetables and fruits. The American Heart Association is a Microbiologist!  American Heart Association Diet and Lifeystyle Recommendations

## 2020-07-13 NOTE — Progress Notes (Signed)
Office Visit    Patient Name: Leah Ferrell Date of Encounter: 07/13/2020  PCP:  Sharion Balloon, [place]  Cardiologist:  [name], MD  Advanced Practice Provider:  No care team member to display Electrophysiologist:  None   Chief Complaint    Leah RUPPERT is a 70 y.o. female with a hx of peripheral artery disease s/p left common iliac artery stent followed by left common femoral artery resection and bypass and left external iliac to left profunda by Dr. 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 CAD (coronary artery disease)    a. 04/2012 NSTEMI: s/p DES to LAD.  Marland Kitchen Chronic diastolic CHF (congestive heart failure) ([place])   . Diabetes mellitus without complication ([place])   . Dysrhythmia   . Environmental and seasonal allergies   . Hyperlipidemia   . Hypertension   . Leukocytosis   . Morbid obesity ([place])   . NSTEMI (non-ST elevated myocardial infarction) ([place]) 04/27/2012  . PONV (postoperative nausea and vomiting)   . PVD (peripheral vascular disease) ([place])    a. 04/2012 ABI: R 0.49, L 0.39. Angiography 06/14: Significant ostial left common iliac artery stenosis extending into the distal aorta, severe left common femoral artery stenosis, bilateral SFA occlusion with heavy calcifications. Functionally, one-vessel runoff bilaterally below the knee  . Renal artery stenosis (H[facility])    s/p angioplasty/stenting bilateral renal arteries 03/16/19  . Shortness of breath    Past Surgical History:  Procedure Laterality Date  . ABDOMINAL AORTAGRAM N/A 07/21/2012   Procedure: ABDOMINAL Maxcine Ham;  Surgeon: Wellington Hampshire, MD;  Location: [facility] CATH LAB;  Service:  Cardiovascular;  Laterality: N/A;  . ABDOMINAL AORTOGRAM W/LOWER EXTREMITY N/A 05/09/2020   Procedure: ABDOMINAL AORTOGRAM W/LOWER EXTREMITY;  Surgeon: Wellington Hampshire, MD;  Location: [place] CV LAB;  Service: Cardiovascular;  Laterality: N/A;  . CARDIAC CATHETERIZATION     stents X 2  . CORONARY ANGIOGRAM  04/27/2012   Procedure: CORONARY ANGIOGRAM;  Surgeon: Thayer Headings, MD;  Location: [facility] CATH LAB;  Service: Cardiovascular;;  . CORONARY ARTERY BYPASS GRAFT N/A 06/25/2020   Procedure: CORONARY ARTERY BYPASS GRAFTING (CABG)X3. LEFT INTERNAL MAMMARY ARTERY. RIGHT ENDOSCOPIC SAPHENOUS VEIN HARVESTING.;  Surgeon: Wonda Olds, MD;  Location: [place];  Service: Open Heart Surgery;  Laterality: N/A;  . ENDARTERECTOMY FEMORAL Left 11/02/2013   Procedure: RESECTION LEFT COMMON FEMORAL ARTERY AND INSERTION OF INTERPOSITION 78m HEMASHIELD GRAFT FROM LEFT EXTERNAL  ILIAC ARTERY TO LEFT PROFUNDA  FEMORIS ARTERY;  Surgeon: JMal Misty MD;  [place] [facility]  Service: Vascular;  Laterality: Left;  . EYE SURGERY Right 2017  . FRACTURE SURGERY Right Jul 07, 2014   Right [place]-  [place]  [facility]  by Heat duct  . ILIAC ARTERY STENT Left 08/31/2013  . LAD stent    . LEFT HEART CATH AND CORONARY ANGIOGRAPHY N/A 05/09/2020   Procedure: LEFT HEART CATH AND CORONARY ANGIOGRAPHY;  Surgeon: AWellington Hampshire MD;  [facility];  Service: Cardiovascular;  Laterality: N/A;  . LEFT HEART CATHETERIZATION WITH CORONARY ANGIOGRAM N/A 04/23/2012   Procedure: LEFT HEART CATHETERIZATION WITH CORONARY ANGIOGRAM;  Surgeon: Peter M JMartinique MD;  Location: [facility] CATH LAB;  Service: Cardiovascular;  Laterality: N/A;  . LOWER EXTREMITY ANGIOGRAM Left 08/31/2013   Procedure: LOWER EXTREMITY ANGIOGRAM;  Surgeon: Wellington Hampshire, MD;  Location: [place] [facility] CATH LAB;  Service: Cardiovascular;  Laterality: Left;  . PERCUTANEOUS STENT INTERVENTION Left 08/31/2013   Procedure: PERCUTANEOUS STENT INTERVENTION;  Surgeon: Wellington Hampshire, MD;  Location: [facility] CATH LAB;  Service: Cardiovascular;  Laterality: Left;  Common Iliac artery  . PERIPHERAL VASCULAR CATHETERIZATION N/A 03/19/2016   Procedure: Abdominal Aortogram w/Lower Extremity;  Surgeon: Wellington Hampshire, MD;  Location: [facility] CV LAB;  Service: Cardiovascular;  Laterality: N/A;  . PERIPHERAL VASCULAR CATHETERIZATION  03/19/2016   Procedure: Peripheral Vascular Balloon Angioplasty-CFA Left;  Surgeon: Wellington Hampshire, MD;  Location: [place] CV LAB;  Service: Cardiovascular;;  . PERIPHERAL VASCULAR INTERVENTION Bilateral 03/16/2019   Procedure: PERIPHERAL VASCULAR INTERVENTION;  Surgeon: Wellington Hampshire, MD;  Location: [place] CV LAB;  Service: Cardiovascular;  Laterality: Bilateral;  RENAL  . PERIPHERAL VASCULAR INTERVENTION Bilateral 05/09/2020   Procedure: PERIPHERAL VASCULAR INTERVENTION;  Surgeon: Wellington Hampshire, MD;  Location: [facility] CV LAB;  Service: Cardiovascular;  Laterality: Bilateral;  Iliac  . RENAL ANGIOGRAPHY  03/16/2019  . RENAL ANGIOGRAPHY Bilateral 03/16/2019   Procedure: RENAL ANGIOGRAPHY;  Surgeon: Wellington Hampshire, MD;  Location: [place] CV LAB;  Service: Cardiovascular;  Laterality: Bilateral;  . TEE WITHOUT CARDIOVERSION N/A 06/25/2020   Procedure: TRANSESOPHAGEAL ECHOCARDIOGRAM (TEE);  Surgeon: Wonda Olds, MD;  Location: [facility];  Service: Open Heart Surgery;  Laterality: N/A;  . 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 Tape Rash    History of Present Illness    Leah Ferrell is a 70 y.o. female with a hx of  peripheral artery disease s/p left common iliac artery stent followed by left common femoral artery resection and bypass and left external iliac to left profunda by Dr. 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  She has a long history of peripheral arterial disease as well as coronary artery disease.  She had left common iliac artery stent followed by left common femoral artery resection and bypass most external iliac to left profunda by Dr. Kellie Simmering 2015.  Known chronically occluded bilateral SFAs recommended for medical management.  Worsening claudication in 2018 with angiography showing severe stenosis in left common femoral artery at the proximal anastomosis of the bypass [place] with successful angioplasty and drug-coated balloon angioplasty.  She had bilateral renal artery stenting January 2021 due to refractory hypertension, noted significant improvement in blood pressure and heart failure symptoms.  She underwent cardiac catheterization and lower extremity angiography due to anginal symptoms claudication 04/2020 revealing heavily calcified coronary arteries with significant restenosis in proximal LAD stent and significant circumflex disease with recommendation for CABG.  Lower extremity angiography with patent renal artery stents with significant restenosis on right side, severe bilateral heavily calcified ostial common iliac artery disease extending into distal aorta and iliac arteries were treated back to sinus stent placement.  On the right side noted moderate common femoral artery stenosis and chronically occluded SFA with 3 attempts due to shin distally via collaterals from profunda, short occlusion of the above-the-knee popliteal artery with collaterals three-vessel ankle anemia.  On the  left there was flush occlusion of SFA with collaterals from the profunda and two-vessel runoff below the knee.  She was seen in follow-up for/9/22 by Dr. Sophronia Simas and referred for CABG.  From a peripheral artery disease standpoint she was doing well with small right toe ulceration healing.  On 06/25/2020 she underwent CABG x3 (SVG-LCx, SVG-diagonal, LIMA-LAD).   She did has noted vaginal bleeding that quickly resolved with uncertain etiology.  Postoperative course was otherwise unremarkable.  She was seen by Dr. Orvan Seen yesterday for surgical follow-up and doing well.  Chest x-ray was without acute findings.  She presents today for follow-up. She has started with home health therapies and tells me strength is gradually improving. Notes her dyspnea on exertion is also improving. No chest pain, pressure, tightness. Reports some mild incisional soreness for which she takes an occasional pain pill at night mostly to help her sleep. She reports only mild swelling at her RLE vein graft site. No dizziness, near syncope, syncope. Notes mild lightheadedness if she changes positions too quickly. She is pleased with her progress after surgery.  EKGs/Labs/Other Studies Reviewed:   The following studies were reviewed today:  EKG:  EKG today demonstrated normal sinus rhythm 77bpm with TWI noted in inferolateral leads. There were T wave abnormalities to lateral leads noted in previous EKG. New TWI noted to inferior leads.   Recent Labs: 06/21/2020: ALT 32 06/26/2020: Magnesium 2.2 06/30/2020: BUN 10; Creatinine, Ser 0.62; Hemoglobin 9.3; Platelets 303; Potassium 4.0; Sodium 134  Recent Lipid Panel    Component Value Date/Time   CHOL 150 03/20/2020 1222   TRIG 137 03/20/2020 1222   TRIG 135 09/16/2012 1322   HDL 60 03/20/2020 1222   HDL 60 09/16/2012 1322   CHOLHDL 2.5 03/20/2020 1222   CHOLHDL 3.2 05/06/2012 1713   VLDL 32 05/06/2012 1713   LDLCALC 66 03/20/2020 1222   LDLCALC 92 09/16/2012 1322     Home Medications   Current Meds  Medication Sig  . acetaminophen (TYLENOL) 325 MG tablet Take 2 tablets (650 mg total) by mouth every 6 (six) hours as needed for mild pain (or Fever >/= 101).  Marland Kitchen albuterol (PROVENTIL HFA) 108 (90 Base) MCG/ACT inhaler Inhale 1-2 puffs into the lungs every 6 (six) hours as needed for wheezing or shortness of breath.  Marland Kitchen amLODipine  (NORVASC) 5 MG tablet Take 1 tablet (5 mg total) by mouth daily.  . Ascorbic Acid (VITAMIN C) 1000 MG tablet Take 1,000 mg by mouth 3 (three) times daily.   Marland Kitchen aspirin EC 81 MG tablet Take 81 mg by mouth daily. In the morning  . BD ULTRA-FINE PEN NEEDLES 29G X 12.7MM MISC USE PENNEEDLES 3 TIMES  DAILY AS DIRECTED  . blood glucose meter kit and supplies Dispense based on patient and insurance preference. Use up to four times daily as directed. (FOR ICD-10 E10.9, E11.9).  . Calcium Carbonate-Vit D-Min (CALTRATE 600+D PLUS MINERALS) 600-800 MG-UNIT TABS Take 1 tablet by mouth 3 (three) times daily.   . carvedilol (COREG) 6.25 MG tablet Take 1 tablet by mouth twice daily (Patient taking differently: Take 6.25 mg by mouth 2 (two) times daily with a meal.)  . Cholecalciferol (VITAMIN D-3) 125 MCG (5000 UT) TABS Take 5,000 Units by mouth daily.  . clopidogrel (PLAVIX) 75 MG tablet Take 1 tablet (75 mg total) by mouth daily.  . fish oil-omega-3 fatty acids 1000 MG capsule Take 1 g by mouth daily.   Marland Kitchen glucose blood (GLUCOSE METER TEST) test strip  Use 6-10 times a day, Uses ReliOn  . halobetasol (ULTRAVATE) 0.05 % cream Apply 1 application topically daily as needed (For rash).  . hydroxypropyl methylcellulose (ISOPTO TEARS) 2.5 % ophthalmic solution Place 1 drop into both eyes 3 (three) times daily as needed (for dry eyes).  . insulin aspart (FIASP FLEXTOUCH) 100 UNIT/ML FlexTouch Pen Inject 40-50 Units into the skin 3 (three) times daily before meals.  . Insulin Syringe 27G X 1/2" 0.5 ML MISC Use with Fiasp insulin TID  . lisinopril (ZESTRIL) 20 MG tablet Take 1 tablet (20 mg total) by mouth 2 (two) times daily.  . montelukast (SINGULAIR) 10 MG tablet Take 1 tablet by mouth once daily with breakfast (Patient taking differently: Take 10 mg by mouth daily.)  . RELION PEN NEEDLES 29G X 12MM MISC USE 1 NEEDLE TO INJECT INSULIN SUBCUTANEOUSLY 3 TIMES DAILY (Patient taking differently: 1 Device by Subconjunctival  route 3 (three) times daily.)  . rosuvastatin (CRESTOR) 40 MG tablet Take 1 tablet (40 mg total) by mouth daily.  . Semaglutide,0.25 or 0.5MG/DOS, (OZEMPIC, 0.25 OR 0.5 MG/DOSE,) 2 MG/1.5ML SOPN Inject 1 mg into the skin once a week. Friday  . [DISCONTINUED] ferrous [id number] C-folic acid (TRINSICON / FOLTRIN) capsule Take 1 capsule by mouth 2 (two) times daily after a meal.     Review of Systems   All other systems reviewed and are otherwise negative except as noted above.  Physical Exam    VS:  BP (!) 114/58   Pulse 77   Ht 5' 2.54" (1.589 m)   Wt 195 lb 6.4 oz (88.6 kg)   SpO2 97%   BMI 35.12 kg/m  , BMI Body mass index is 35.12 kg/m.  Wt Readings from Last 3 Encounters:  07/13/20 195 lb 6.4 oz (88.6 kg)  07/12/20 200 lb (90.7 kg)  07/03/20 208 lb 15.2 oz (94.8 kg)     GEN: Well nourished, well developed, in no acute distress. HEENT: normal. Neck: Supple, no JVD, carotid bruits, or masses. Cardiac: RRR, no murmurs, rubs, or gallops. No clubbing, cyanosis. Trace edema to RLE vein graft site, no hematoma.Marland Kitchen  Respiratory:  Respirations regular and unlabored, clear to auscultation bilaterally. GI: Soft, nontender, nondistended. MS: No deformity or atrophy. Skin: Warm and dry, no rash.  Midsternal, chest tube suture sites, right lower extremity vein graft site healing appropriately with edges well approximated and no signs of infection.  Scattered ecchymosis including bilateral upper extremities, right lower extremity with no hematoma. Neuro:  Strength and sensation are intact. Psych: Normal affect.  Assessment & Plan    1. Peripheral arterial disease- s/p kissing stent to the common iliac artery 04/2020 due to severe right calf claudication. Post procedure duplex with patent iliac stent. No new signs of claudication.  Continue aspirin, Plavix.  Continue to follow with Dr. Fletcher [name].   2. 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  No further intervention at this time. BP well controlled. Continue current antihypertensive regimen.   3. Coronary artery disease s/p DES and CABG 06/2020- CABGx3 with LIMA-LAD, SVG-diagonal, SVG-LCx. Surgical incisions healing appropriately. Following sternal precautions. Already followed up with Dr. Orvan Seen of [facility] with CXR showing no acute findings.  EKG today SR with TWI in inferolateral leads. The inferior TWI is new compared to previous. She has no anginal symptoms and no indication for repeat ischemic evaluation. Anticipate these are stable post-CABG findings as she is  asymptomatic with no signs concerning for graft failure. Monitor with periodic EKG. Working with PT at home and would benefit from cardiac rehab when home health completed. Heart healthy diet and regular cardiovascular exercise encouraged. GDMT includes Aspirin, Plavix, coreg, Rosuvastatin.  4. Hyperlipidemia, LDL goal less than 70- 03/20/20 LDL 66. Continue Rosuvastatin.   5. DM2 - 06/21/20 A1c 7.7. Continue to follow with PCP.  6. Chronic diastolic heart failure- Euvolemic and well compensated on exam. Much improved since renal artery stent. No indicaiton for diuretic at this time. Heart healthy diet and regular cardiovascular exercise encouraged.   Disposition: Follow up In July as scheduled with Dr. Fletcher [name]  Signed, Loel Dubonnet, NP 07/13/2020, 11:58 AM [place]

## 2020-07-17 DIAGNOSIS — I110 Hypertensive heart disease with heart failure: Secondary | ICD-10-CM | POA: Diagnosis not present

## 2020-07-17 DIAGNOSIS — I2510 Atherosclerotic heart disease of native coronary artery without angina pectoris: Secondary | ICD-10-CM | POA: Diagnosis not present

## 2020-07-17 DIAGNOSIS — Z48812 Encounter for surgical aftercare following surgery on the circulatory system: Secondary | ICD-10-CM | POA: Diagnosis not present

## 2020-07-17 DIAGNOSIS — E1151 Type 2 diabetes mellitus with diabetic peripheral angiopathy without gangrene: Secondary | ICD-10-CM | POA: Diagnosis not present

## 2020-07-17 DIAGNOSIS — I5032 Chronic diastolic (congestive) heart failure: Secondary | ICD-10-CM | POA: Diagnosis not present

## 2020-07-17 DIAGNOSIS — I739 Peripheral vascular disease, unspecified: Secondary | ICD-10-CM | POA: Diagnosis not present

## 2020-07-18 NOTE — Addendum Note (Signed)
Addended by: Patria Mane A on: 07/18/2020 03:51 PM   Modules accepted: Orders

## 2020-07-19 DIAGNOSIS — I739 Peripheral vascular disease, unspecified: Secondary | ICD-10-CM | POA: Diagnosis not present

## 2020-07-19 DIAGNOSIS — I2510 Atherosclerotic heart disease of native coronary artery without angina pectoris: Secondary | ICD-10-CM | POA: Diagnosis not present

## 2020-07-19 DIAGNOSIS — Z48812 Encounter for surgical aftercare following surgery on the circulatory system: Secondary | ICD-10-CM | POA: Diagnosis not present

## 2020-07-19 DIAGNOSIS — I5032 Chronic diastolic (congestive) heart failure: Secondary | ICD-10-CM | POA: Diagnosis not present

## 2020-07-19 DIAGNOSIS — I110 Hypertensive heart disease with heart failure: Secondary | ICD-10-CM | POA: Diagnosis not present

## 2020-07-19 DIAGNOSIS — E1151 Type 2 diabetes mellitus with diabetic peripheral angiopathy without gangrene: Secondary | ICD-10-CM | POA: Diagnosis not present

## 2020-07-24 ENCOUNTER — Telehealth: Payer: Self-pay | Admitting: Cardiovascular Disease

## 2020-07-24 DIAGNOSIS — I2510 Atherosclerotic heart disease of native coronary artery without angina pectoris: Secondary | ICD-10-CM | POA: Diagnosis not present

## 2020-07-24 DIAGNOSIS — E1151 Type 2 diabetes mellitus with diabetic peripheral angiopathy without gangrene: Secondary | ICD-10-CM | POA: Diagnosis not present

## 2020-07-24 DIAGNOSIS — Z48812 Encounter for surgical aftercare following surgery on the circulatory system: Secondary | ICD-10-CM | POA: Diagnosis not present

## 2020-07-24 DIAGNOSIS — I739 Peripheral vascular disease, unspecified: Secondary | ICD-10-CM | POA: Diagnosis not present

## 2020-07-24 DIAGNOSIS — I110 Hypertensive heart disease with heart failure: Secondary | ICD-10-CM | POA: Diagnosis not present

## 2020-07-24 DIAGNOSIS — I5032 Chronic diastolic (congestive) heart failure: Secondary | ICD-10-CM | POA: Diagnosis not present

## 2020-07-24 NOTE — Telephone Encounter (Signed)
Spoke to patient she stated she is doing good.Stated she had CABG 06/25/20.Stated she saw Laurann Montana NP 07/13/20 for post hospital follow up.She wanted to ask her if ok for her to go home now.She is presently living with her daughter and she feels like she can go home.Advised I will send message to her.

## 2020-07-24 NOTE — Telephone Encounter (Signed)
New message:     Patient calling to ask if she can go home from her daughter home. Patient states she would like to know if it is ok for her to go back home she doing much better, please advise.

## 2020-07-24 NOTE — Telephone Encounter (Signed)
Spoke to patient Leah Ferrell's advice given.Patient stated she is very happy to go home.

## 2020-07-31 ENCOUNTER — Telehealth: Payer: Self-pay | Admitting: Family

## 2020-07-31 ENCOUNTER — Telehealth: Payer: Self-pay | Admitting: Cardiovascular Disease

## 2020-07-31 MED ORDER — MONTELUKAST SODIUM 10 MG PO TABS: ORAL_TABLET | ORAL | 2 refills | Status: DC

## 2020-07-31 NOTE — Telephone Encounter (Signed)
Patient called to say that she has infection and that her pcp give her Cephalexin (anibotic)  to help with that. She wanted to see Dr. Fletcher [name] want her to come in to see her infection.

## 2020-07-31 NOTE — Telephone Encounter (Signed)
  Prescription Request  07/31/2020  What is the name of the medication or equipment? montelukast (SINGULAIR) 10 MG tablet   Have you contacted your pharmacy to request a refill? (if applicable) Yes  Which pharmacy would you like this sent to? [facility] [place]    Patient notified that their request is being sent to the clinical staff for review and that they should receive a response within 2 business days.

## 2020-07-31 NOTE — Telephone Encounter (Signed)
Spoke with patient of Dr. Fletcher [name] - she recently had CABGx3 She reports poor healing on the last few inches of her surgical scar The area is red, states clear liquid is coming out, area is sore She has been trying to keep the area clean and dry She reports the scar area "moves" when she sits up, OK when lying down Patient denies fever  Patient was prescribed cephalexin for a foot infection by per PCP, prior to PV intervention -- she is not currently taking this, only referenced this as the medication works good for her  Advised will send a message to MD/NP to review and advise  Also advised patient should contact PCP office to see if she can get an appointment to assess the site

## 2020-07-31 NOTE — Telephone Encounter (Signed)
We can try Keflex 500 mg 3 times daily for 5 days.  If there is no improvement, she will need to schedule a follow-up appointment with cardiothoracic surgery office.

## 2020-07-31 NOTE — Telephone Encounter (Signed)
Refill sent.

## 2020-08-01 ENCOUNTER — Ambulatory Visit (INDEPENDENT_AMBULATORY_CARE_PROVIDER_SITE_OTHER): Payer: Medicare Other | Admitting: Family Medicine

## 2020-08-01 ENCOUNTER — Other Ambulatory Visit: Payer: Self-pay

## 2020-08-01 ENCOUNTER — Encounter: Payer: Self-pay | Admitting: Family Medicine

## 2020-08-01 VITALS — BP 155/62 | HR 67 | Ht 62.0 in | Wt 197.0 lb

## 2020-08-01 DIAGNOSIS — T8149XA Infection following a procedure, other surgical site, initial encounter: Secondary | ICD-10-CM

## 2020-08-01 MED ORDER — ONDANSETRON 4 MG PO TBDP: 4.0000 mg | ORAL_TABLET | Freq: Three times a day (TID) | ORAL | 0 refills | Status: DC | PRN

## 2020-08-01 MED ORDER — SULFAMETHOXAZOLE-TRIMETHOPRIM 800-160 MG PO TABS: 1.0000 | ORAL_TABLET | Freq: Two times a day (BID) | ORAL | 0 refills | Status: DC

## 2020-08-01 NOTE — Telephone Encounter (Signed)
Bactrim is fine from my standpoint.  Thank you.

## 2020-08-01 NOTE — Telephone Encounter (Signed)
Spoke with patient to relay message from Dr. Fletcher [name]  She has a visit with PCP office today   She also states she has a prescription for Keflex at home that she takes on Sunday, when she feels like she has an infection  She states she has Keflex at home that she takes when she is to have surgery, she has 10 pills left.   Advised will hold off on sending in prescription until visit with PCP office today

## 2020-08-01 NOTE — Progress Notes (Signed)
BP (!) 155/62   Pulse 67   Ht 5' 2" (1.575 m)   Wt 197 lb (89.4 kg)   SpO2 96%   BMI 36.03 kg/m    Subjective:   Patient ID: Leah Ferrell, female    DOB: 08/26/1950, 70 y.o.   MRN: [id number]  HPI: Leah Ferrell is a 70 y.o. female presenting on 08/01/2020 for Wound Infection (Mid chest. Draining, sore and red. Triple bypass 5 weeks ago.)   HPI Wound infection chest Patient had and on the lower part of the incision on her chest just below her breast region she has noted that she is having purulent drainage and it is red and warm and painful and this just cropped up over the past few days.  She did have some Keflex that she had from an old prescription and started taking it again.  She denies any fevers or chills or shortness of breath or chest pain.  Relevant past medical, surgical, family and social history reviewed and updated as indicated. Interim medical history since our last visit reviewed. Allergies and medications reviewed and updated.  Review of Systems  Constitutional:  Negative for chills and fever.  HENT:  Negative for ear pain.   Respiratory:  Negative for chest tightness and shortness of breath.   Cardiovascular:  Negative for chest pain and leg swelling.  Skin:  Positive for color change and wound. Negative for rash.  Neurological:  Negative for light-headedness and headaches.  Psychiatric/Behavioral:  Negative for agitation and behavioral problems.   All other systems reviewed and are negative.  Per HPI unless specifically indicated above   Allergies as of 08/01/2020       Reactions   Tape Rash        Medication List        Accurate as of August 01, 2020 11:11 AM. If you have any questions, ask your nurse or doctor.          acetaminophen 325 MG tablet Commonly known as: TYLENOL Take 2 tablets (650 mg total) by mouth every 6 (six) hours as needed for mild pain (or Fever >/= 101).   albuterol 108 (90 Base) MCG/ACT inhaler Commonly known  as: Proventil HFA Inhale 1-2 puffs into the lungs every 6 (six) hours as needed for wheezing or shortness of breath.   amLODipine 5 MG tablet Commonly known as: NORVASC Take 1 tablet (5 mg total) by mouth daily.   aspirin EC 81 MG tablet Take 81 mg by mouth daily. In the morning   blood glucose meter kit and supplies Dispense based on patient and insurance preference. Use up to four times daily as directed. (FOR ICD-10 E10.9, E11.9).   Caltrate 600+D Plus Minerals 600-800 MG-UNIT Tabs Take 1 tablet by mouth 3 (three) times daily.   carvedilol 6.25 MG tablet Commonly known as: COREG Take 1 tablet by mouth twice daily What changed: when to take this   clopidogrel 75 MG tablet Commonly known as: PLAVIX Take 1 tablet (75 mg total) by mouth daily.   Fiasp FlexTouch 100 UNIT/ML FlexTouch Pen Generic drug: insulin aspart Inject 40-50 Units into the skin 3 (three) times daily before meals.   fish oil-omega-3 fatty acids 1000 MG capsule Take 1 g by mouth daily.   Glucose Meter Test test strip Generic drug: glucose blood Use 6-10 times a day, Uses ReliOn   halobetasol 0.05 % cream Commonly known as: ULTRAVATE Apply 1 application topically daily as needed (For rash).  hydroxypropyl methylcellulose / hypromellose 2.5 % ophthalmic solution Commonly known as: ISOPTO TEARS / GONIOVISC Place 1 drop into both eyes 3 (three) times daily as needed (for dry eyes).   Insulin Syringe 27G X 1/2" 0.5 ML Misc Use with Fiasp insulin TID   lisinopril 20 MG tablet Commonly known as: ZESTRIL Take 1 tablet (20 mg total) by mouth 2 (two) times daily.   montelukast 10 MG tablet Commonly known as: SINGULAIR Take 1 tablet by mouth once daily with breakfast   Ozempic (0.25 or 0.5 MG/DOSE) 2 MG/1.5ML Sopn Generic drug: Semaglutide(0.25 or 0.5MG/DOS) Inject 1 mg into the skin once a week. Friday   ReliOn Pen Needles 29G X 12MM Misc Generic drug: Insulin Pen Needle USE 1 NEEDLE TO INJECT  INSULIN SUBCUTANEOUSLY 3 TIMES DAILY What changed: See the new instructions.   BD ULTRA-FINE PEN NEEDLES 29G X 12.7MM Misc Generic drug: Insulin Pen Needle USE PENNEEDLES 3 TIMES  DAILY AS DIRECTED What changed: Another medication with the same name was changed. Make sure you understand how and when to take each.   rosuvastatin 40 MG tablet Commonly known as: CRESTOR Take 1 tablet (40 mg total) by mouth daily.   vitamin C 1000 MG tablet Take 1,000 mg by mouth 3 (three) times daily.   Vitamin D-3 125 MCG (5000 UT) Tabs Take 5,000 Units by mouth daily.         Objective:   BP (!) 155/62   Pulse 67   Ht 5' 2" (1.575 m)   Wt 197 lb (89.4 kg)   SpO2 96%   BMI 36.03 kg/m   Wt Readings from Last 3 Encounters:  08/01/20 197 lb (89.4 kg)  07/13/20 195 lb 6.4 oz (88.6 kg)  07/12/20 200 lb (90.7 kg)    Physical Exam Vitals and nursing note reviewed.  Constitutional:      Appearance: Normal appearance.  Skin:      Neurological:     Mental Status: She is alert.      Assessment & Plan:   Problem List Items Addressed This Visit   None Visit Diagnoses     Incisional infection    -  Primary   Relevant Medications   sulfamethoxazole-trimethoprim (BACTRIM DS) 800-160 MG tablet   ondansetron (ZOFRAN ODT) 4 MG disintegrating tablet       Will give Bactrim and Zofran just in case she gets nauseated.  Follow-up if worsens or does not improve. Follow up plan: Return if symptoms worsen or fail to improve.  Counseling provided for all of the vaccine components No orders of the defined types were placed in this encounter.   Caryl Pina, MD [facility] 08/01/2020, 11:11 AM

## 2020-08-01 NOTE — Telephone Encounter (Signed)
Patient seen at PCP office and started on antbiotic

## 2020-08-23 ENCOUNTER — Encounter: Payer: Self-pay | Admitting: Family

## 2020-08-23 ENCOUNTER — Other Ambulatory Visit: Payer: Self-pay

## 2020-08-23 ENCOUNTER — Ambulatory Visit (INDEPENDENT_AMBULATORY_CARE_PROVIDER_SITE_OTHER): Payer: Medicare Other | Admitting: Family

## 2020-08-23 VITALS — BP 177/67 | HR 66 | Temp 96.0°F | Ht 62.0 in | Wt 200.0 lb

## 2020-08-23 DIAGNOSIS — E1159 Type 2 diabetes mellitus with other circulatory complications: Secondary | ICD-10-CM

## 2020-08-23 DIAGNOSIS — I2510 Atherosclerotic heart disease of native coronary artery without angina pectoris: Secondary | ICD-10-CM | POA: Diagnosis not present

## 2020-08-23 DIAGNOSIS — Z955 Presence of coronary angioplasty implant and graft: Secondary | ICD-10-CM

## 2020-08-23 DIAGNOSIS — I252 Old myocardial infarction: Secondary | ICD-10-CM

## 2020-08-23 DIAGNOSIS — E1151 Type 2 diabetes mellitus with diabetic peripheral angiopathy without gangrene: Secondary | ICD-10-CM | POA: Diagnosis not present

## 2020-08-23 DIAGNOSIS — Z794 Long term (current) use of insulin: Secondary | ICD-10-CM

## 2020-08-23 DIAGNOSIS — E1142 Type 2 diabetes mellitus with diabetic polyneuropathy: Secondary | ICD-10-CM | POA: Diagnosis not present

## 2020-08-23 DIAGNOSIS — T8149XD Infection following a procedure, other surgical site, subsequent encounter: Secondary | ICD-10-CM | POA: Diagnosis not present

## 2020-08-23 DIAGNOSIS — Z951 Presence of aortocoronary bypass graft: Secondary | ICD-10-CM | POA: Diagnosis not present

## 2020-08-23 DIAGNOSIS — I5033 Acute on chronic diastolic (congestive) heart failure: Secondary | ICD-10-CM | POA: Diagnosis not present

## 2020-08-23 DIAGNOSIS — I739 Peripheral vascular disease, unspecified: Secondary | ICD-10-CM

## 2020-08-23 DIAGNOSIS — I152 Hypertension secondary to endocrine disorders: Secondary | ICD-10-CM | POA: Diagnosis not present

## 2020-08-23 DIAGNOSIS — T8149XA Infection following a procedure, other surgical site, initial encounter: Secondary | ICD-10-CM

## 2020-08-23 LAB — BAYER DCA HB A1C WAIVED: HB A1C (BAYER DCA - WAIVED): 6.8 % (ref <7.0)

## 2020-08-23 MED ORDER — SULFAMETHOXAZOLE-TRIMETHOPRIM 800-160 MG PO TABS: 1.0000 | ORAL_TABLET | Freq: Two times a day (BID) | ORAL | 0 refills | Status: DC

## 2020-08-23 NOTE — Progress Notes (Signed)
Subjective:    Patient ID: Leah Ferrell, female    DOB: 1950-07-05, 70 y.o.   MRN: [id number]  Chief Complaint  Patient presents with   Medical Management of Chronic Issues   Pt presents to the office today for chronic follow up. Pt has CHF, CAD and hx MI and is followed by Cardiologists every 6 months. PT is followed by Vascular  for PVD and PAD. PT states these are stable.    Pt had a renal angiography on 03/16/19. She reports her swelling is improved and her BP is improved.     She had critical lower limb ischemia and had abdominal aortogram with lower extremity. 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 She reports she had some discharge, erythemas. She was seen in our office on 08/01/20 and given Bactrim for 10 days. She reports the area is improved, but still draining a clear, milky substance at times.    Diabetes She presents for her follow-up diabetic visit. She has type 2 diabetes mellitus. Associated symptoms include blurred vision and foot paresthesias. Symptoms are stable. Diabetic complications include heart disease, nephropathy and peripheral neuropathy. Risk factors for coronary artery disease include dyslipidemia, diabetes mellitus, hypertension and sedentary lifestyle. She is following a generally unhealthy diet. Her overall blood glucose range is 140-180 mg/dl.  Hypertension This is a chronic problem. The current episode started more than 1 year ago. The problem has been waxing and waning since onset. The problem is uncontrolled. Associated symptoms include blurred vision, malaise/fatigue and peripheral edema. Pertinent negatives include no shortness of breath. Risk factors for coronary artery disease include dyslipidemia and obesity. The current treatment provides moderate improvement.  Hyperlipidemia This is a chronic problem. The current episode started more than 1 year ago. The problem is controlled. Recent lipid tests were reviewed and are normal. Exacerbating diseases include  obesity. Pertinent negatives include no shortness of breath. Current antihyperlipidemic treatment includes statins. The current treatment provides moderate improvement of lipids. Risk factors for coronary artery disease include dyslipidemia, diabetes mellitus, hypertension, a sedentary lifestyle and post-menopausal.     Review of Systems  Constitutional:  Positive for malaise/fatigue.  Eyes:  Positive for blurred vision.  Respiratory:  Negative for shortness of breath.   All other systems reviewed and are negative.     Objective:   Physical Exam Vitals reviewed.  Constitutional:      General: She is not in acute distress.    Appearance: She is well-developed.  HENT:     Head: Normocephalic and atraumatic.     Right Ear: Tympanic membrane normal.     Left Ear: Tympanic membrane normal.  Eyes:     Pupils: Pupils are equal, round, and reactive to light.  Neck:     Thyroid: No thyromegaly.  Cardiovascular:     Rate and Rhythm: Normal rate and regular rhythm.     Heart sounds: Normal heart sounds. No murmur heard. Pulmonary:     Effort: Pulmonary effort is normal. No respiratory distress.     Breath sounds: Normal breath sounds. No wheezing.  Abdominal:     General: Bowel sounds are normal. There is no distension.     Palpations: Abdomen is soft.     Tenderness: There is no abdominal tenderness.  Musculoskeletal:        General: No tenderness. Normal range of motion.     Cervical back: Normal range of motion and neck supple.  Skin:    General: Skin is warm and dry.  Comments: Incision improving, however still two pockets that are draining clear milky discharge    Neurological:     Mental Status: She is alert and oriented to person, place, and time.     Cranial Nerves: No cranial nerve deficit.     Deep Tendon Reflexes: Reflexes are normal and symmetric.  Psychiatric:        Behavior: Behavior normal.        Thought Content: Thought content normal.        Judgment:  Judgment normal.      BP (!) 177/67   Pulse 66   Temp (!) 96 F (35.6 C) (Temporal)   Ht _0  (1.575 m)   Wt 200 lb (90.7 kg)   SpO2 95%   BMI 36.58 kg/m      Assessment & Plan:  Leah Ferrell comes in today with chief complaint of Medical Management of Chronic Issues   Diagnosis and orders addressed:  1. Acute on chronic diastolic CHF (congestive heart failure) (HCC) - CMP14+EGFR - CBC with Differential/Platelet  2. Hypertension associated with diabetes ([place]) - CMP14+EGFR - CBC with Differential/Platelet  3. Diabetes mellitus with peripheral vascular disease ([place]) - Bayer DCA Hb A1c Waived - CMP14+EGFR - CBC with Differential/Platelet  4. Coronary artery disease involving native coronary artery of native heart without angina pectoris - CMP14+EGFR - CBC with Differential/Platelet  5. PAD (peripheral artery disease) (HCC) - CMP14+EGFR - CBC with Differential/Platelet  6. Morbid obesity ([place]) - CMP14+EGFR - CBC with Differential/Platelet  7. S/P primary angioplasty with coronary stent - CMP14+EGFR - CBC with Differential/Platelet  8. S/P CABG x 3 - CMP14+EGFR - CBC with Differential/Platelet  9. Hx of non-ST elevation myocardial infarction (NSTEMI) - CMP14+EGFR - CBC with Differential/Platelet  10. Type 2 diabetes mellitus with diabetic polyneuropathy, with long-term current use of insulin (HCC) - CMP14+EGFR - CBC with Differential/Platelet  11. Incisional infection Will give one more round of Bactrim  Keep clean and dry Keep follow up with surgeon and Cardiologists  - sulfamethoxazole-trimethoprim (BACTRIM DS) 800-160 MG tablet; Take 1 tablet by mouth 2 (two) times daily.  Dispense: 20 tablet; Refill: 0   Labs pending Health Maintenance reviewed Diet and exercise encouraged  Follow up plan: 3 months    Evelina Dun, FNP

## 2020-08-23 NOTE — Patient Instructions (Signed)
Diabetes Mellitus and Nutrition, Adult When you have diabetes, or diabetes mellitus, it is very important to have healthy eating habits because your blood sugar (glucose) levels are greatly affected by what you eat and drink. Eating healthy foods in the right amounts, at about the same times every day, can help you:  Control your blood glucose.  Lower your risk of heart disease.  Improve your blood pressure.  Reach or maintain a healthy weight. What can affect my meal plan? Every person with diabetes is different, and each person has different needs for a meal plan. Your health care provider may recommend that you work with a dietitian to make a meal plan that is best for you. Your meal plan may vary depending on factors such as:  The calories you need.  The medicines you take.  Your weight.  Your blood glucose, blood pressure, and cholesterol levels.  Your activity level.  Other health conditions you have, such as heart or kidney disease. How do carbohydrates affect me? Carbohydrates, also called carbs, affect your blood glucose level more than any other type of food. Eating carbs naturally raises the amount of glucose in your blood. Carb counting is a method for keeping track of how many carbs you eat. Counting carbs is important to keep your blood glucose at a healthy level, especially if you use insulin or take certain oral diabetes medicines. It is important to know how many carbs you can safely have in each meal. This is different for every person. Your dietitian can help you calculate how many carbs you should have at each meal and for each snack. How does alcohol affect me? Alcohol can cause a sudden decrease in blood glucose (hypoglycemia), especially if you use insulin or take certain oral diabetes medicines. Hypoglycemia can be a life-threatening condition. Symptoms of hypoglycemia, such as sleepiness, dizziness, and confusion, are similar to symptoms of having too much  alcohol.  Do not drink alcohol if: ? Your health care provider tells you not to drink. ? You are pregnant, may be pregnant, or are planning to become pregnant.  If you drink alcohol: ? Do not drink on an empty stomach. ? Limit how much you use to:  0-1 drink a day for women.  0-2 drinks a day for men. ? Be aware of how much alcohol is in your drink. In the [place] one drink equals one 12 oz bottle of beer (355 mL), one 5 oz glass of wine (148 mL), or one 1 oz glass of hard liquor (44 mL). ? Keep yourself hydrated with water, diet soda, or unsweetened iced tea.  Keep in mind that regular soda, juice, and other mixers may contain a lot of sugar and must be counted as carbs. What are tips for following this plan? Reading food labels  Start by checking the serving size on the "Nutrition Facts" label of packaged foods and drinks. The amount of calories, carbs, fats, and other nutrients listed on the label is based on one serving of the item. Many items contain more than one serving per package.  Check the total grams (g) of carbs in one serving. You can calculate the number of servings of carbs in one serving by dividing the total carbs by 15. For example, if a food has 30 g of total carbs per serving, it would be equal to 2 servings of carbs.  Check the number of grams (g) of saturated fats and trans fats in one serving. Choose foods that have   a low amount or none of these fats.  Check the number of milligrams (mg) of salt (sodium) in one serving. Most people should limit total sodium intake to less than 2,300 mg per day.  Always check the nutrition information of foods labeled as "low-fat" or "nonfat." These foods may be higher in added sugar or refined carbs and should be avoided.  Talk to your dietitian to identify your daily goals for nutrients listed on the label. Shopping  Avoid buying canned, pre-made, or processed foods. These foods tend to be high in fat, sodium, and added  sugar.  Shop around the outside edge of the grocery store. This is where you will most often find fresh fruits and vegetables, bulk grains, fresh meats, and fresh dairy. Cooking  Use low-heat cooking methods, such as baking, instead of high-heat cooking methods like deep frying.  Cook using healthy oils, such as olive, canola, or sunflower oil.  Avoid cooking with butter, cream, or high-fat meats. Meal planning  Eat meals and snacks regularly, preferably at the same times every day. Avoid going long periods of time without eating.  Eat foods that are high in fiber, such as fresh fruits, vegetables, beans, and whole grains. Talk with your dietitian about how many servings of carbs you can eat at each meal.  Eat 4-6 oz (112-168 g) of lean protein each day, such as lean meat, chicken, fish, eggs, or tofu. One ounce (oz) of lean protein is equal to: ? 1 oz (28 g) of meat, chicken, or fish. ? 1 egg. ?  cup (62 g) of tofu.  Eat some foods each day that contain healthy fats, such as avocado, nuts, seeds, and fish.   What foods should I eat? Fruits Berries. Apples. Oranges. Peaches. Apricots. Plums. Grapes. Mango. Papaya. Pomegranate. Kiwi. Cherries. Vegetables Lettuce. Spinach. Leafy greens, including kale, chard, collard greens, and mustard greens. Beets. Cauliflower. Cabbage. Broccoli. Carrots. Green beans. Tomatoes. Peppers. Onions. Cucumbers. Brussels sprouts. Grains Whole grains, such as whole-wheat or whole-grain bread, crackers, tortillas, cereal, and pasta. Unsweetened oatmeal. Quinoa. Brown or wild rice. Meats and other proteins Seafood. Poultry without skin. Lean cuts of poultry and beef. Tofu. Nuts. Seeds. Dairy Low-fat or fat-free dairy products such as milk, yogurt, and cheese. The items listed above may not be a complete list of foods and beverages you can eat. Contact a dietitian for more information. What foods should I avoid? Fruits Fruits canned with  syrup. Vegetables Canned vegetables. Frozen vegetables with butter or cream sauce. Grains Refined white flour and flour products such as bread, pasta, snack foods, and cereals. Avoid all processed foods. Meats and other proteins Fatty cuts of meat. Poultry with skin. Breaded or fried meats. Processed meat. Avoid saturated fats. Dairy Full-fat yogurt, cheese, or milk. Beverages Sweetened drinks, such as soda or iced tea. The items listed above may not be a complete list of foods and beverages you should avoid. Contact a dietitian for more information. Questions to ask a health care provider  Do I need to meet with a diabetes educator?  Do I need to meet with a dietitian?  What number can I call if I have questions?  When are the best times to check my blood glucose? Where to find more information:  American Diabetes Association: diabetes.org  Academy of Nutrition and Dietetics: www.eatright.org  National Institute of Diabetes and Digestive and Kidney Diseases: www.niddk.nih.gov  Association of Diabetes Care and Education Specialists: www.diabeteseducator.org Summary  It is important to have healthy eating   habits because your blood sugar (glucose) levels are greatly affected by what you eat and drink.  A healthy meal plan will help you control your blood glucose and maintain a healthy lifestyle.  Your health care provider may recommend that you work with a dietitian to make a meal plan that is best for you.  Keep in mind that carbohydrates (carbs) and alcohol have immediate effects on your blood glucose levels. It is important to count carbs and to use alcohol carefully. This information is not intended to replace advice given to you by your health care provider. Make sure you discuss any questions you have with your health care provider. Document Revised: 01/11/2019 Document Reviewed: 01/11/2019 Elsevier Patient Education  2021 Elsevier Inc.  

## 2020-08-24 LAB — CBC WITH DIFFERENTIAL/PLATELET
Basophils Absolute: 0.1 10*3/uL (ref 0.0–0.2)
Basos: 0 %
EOS (ABSOLUTE): 0.2 10*3/uL (ref 0.0–0.4)
Eos: 2 %
Hematocrit: 41.8 % (ref 34.0–46.6)
Hemoglobin: 13.1 g/dL (ref 11.1–15.9)
Immature Grans (Abs): 0 10*3/uL (ref 0.0–0.1)
Immature Granulocytes: 0 %
Lymphocytes Absolute: 3.2 10*3/uL — ABNORMAL HIGH (ref 0.7–3.1)
Lymphs: 27 %
MCH: 29.8 pg (ref 26.6–33.0)
MCHC: 31.3 g/dL — ABNORMAL LOW (ref 31.5–35.7)
MCV: 95 fL (ref 79–97)
Monocytes Absolute: 0.8 10*3/uL (ref 0.1–0.9)
Monocytes: 7 %
Neutrophils Absolute: 7.6 10*3/uL — ABNORMAL HIGH (ref 1.4–7.0)
Neutrophils: 64 %
Platelets: 301 10*3/uL (ref 150–450)
RBC: 4.4 x10E6/uL (ref 3.77–5.28)
RDW: 13 % (ref 11.7–15.4)
WBC: 11.9 10*3/uL — ABNORMAL HIGH (ref 3.4–10.8)

## 2020-08-24 LAB — CMP14+EGFR
ALT: 18 IU/L (ref 0–32)
AST: 21 IU/L (ref 0–40)
Albumin/Globulin Ratio: 1.4 (ref 1.2–2.2)
Albumin: 4.4 g/dL (ref 3.8–4.8)
Alkaline Phosphatase: 71 IU/L (ref 44–121)
BUN/Creatinine Ratio: 12 (ref 12–28)
BUN: 8 mg/dL (ref 8–27)
Bilirubin Total: 0.2 mg/dL (ref 0.0–1.2)
CO2: 29 mmol/L (ref 20–29)
Calcium: 10.1 mg/dL (ref 8.7–10.3)
Chloride: 99 mmol/L (ref 96–106)
Creatinine, Ser: 0.69 mg/dL (ref 0.57–1.00)
Globulin, Total: 3.1 g/dL (ref 1.5–4.5)
Glucose: 134 mg/dL — ABNORMAL HIGH (ref 65–99)
Potassium: 4.4 mmol/L (ref 3.5–5.2)
Sodium: 143 mmol/L (ref 134–144)
Total Protein: 7.5 g/dL (ref 6.0–8.5)
eGFR: 94 mL/min/{1.73_m2} (ref >59)

## 2020-08-31 ENCOUNTER — Telehealth: Payer: Self-pay | Admitting: Family

## 2020-08-31 NOTE — Telephone Encounter (Signed)
Called patient in result note

## 2020-09-03 ENCOUNTER — Telehealth: Payer: Self-pay | Admitting: Family

## 2020-09-04 ENCOUNTER — Ambulatory Visit: Payer: Medicare Other | Admitting: Cardiovascular Disease

## 2020-09-04 NOTE — Telephone Encounter (Signed)
Left message to call back  

## 2020-09-04 NOTE — Telephone Encounter (Signed)
She has been on antibiotics for weeks now. This is not good for her and can cause resistance. Please stay off antibiotics for a few weeks. Call if area increases redness, swelling, discharge, or fever.

## 2020-09-04 NOTE — Telephone Encounter (Signed)
Patient aware and verbalized understanding. °

## 2020-09-05 ENCOUNTER — Other Ambulatory Visit: Payer: Self-pay | Admitting: Family

## 2020-09-05 DIAGNOSIS — E1159 Type 2 diabetes mellitus with other circulatory complications: Secondary | ICD-10-CM

## 2020-09-05 DIAGNOSIS — I152 Hypertension secondary to endocrine disorders: Secondary | ICD-10-CM

## 2020-09-13 ENCOUNTER — Other Ambulatory Visit: Payer: Self-pay | Admitting: Family

## 2020-09-17 ENCOUNTER — Other Ambulatory Visit: Payer: Self-pay | Admitting: Family

## 2020-09-25 ENCOUNTER — Other Ambulatory Visit: Payer: Self-pay

## 2020-09-25 ENCOUNTER — Ambulatory Visit: Payer: Medicare Other | Admitting: Cardiovascular Disease

## 2020-09-25 VITALS — BP 154/57 | HR 68 | Ht 62.5 in | Wt 202.6 lb

## 2020-09-25 DIAGNOSIS — I150 Renovascular hypertension: Secondary | ICD-10-CM | POA: Diagnosis not present

## 2020-09-25 DIAGNOSIS — E785 Hyperlipidemia, unspecified: Secondary | ICD-10-CM

## 2020-09-25 DIAGNOSIS — I5032 Chronic diastolic (congestive) heart failure: Secondary | ICD-10-CM | POA: Diagnosis not present

## 2020-09-25 DIAGNOSIS — I25118 Atherosclerotic heart disease of native coronary artery with other forms of angina pectoris: Secondary | ICD-10-CM | POA: Diagnosis not present

## 2020-09-25 DIAGNOSIS — I739 Peripheral vascular disease, unspecified: Secondary | ICD-10-CM

## 2020-09-25 NOTE — Progress Notes (Signed)
Cardiology Office Note   Date:  09/26/2020   ID:  Leah Ferrell, Leah Ferrell 1950/11/03, MRN [id number]  PCP:  Leah Spencer, FNP  Cardiologist: Leah Ferrell   No chief complaint on file.     History of Present Illness: Leah Ferrell is a 70 y.o. female who presents for a followup visit regarding peripheral arterial disease and coronary artery disease.  She has known history of coronary artery disease status post drug-eluting stent placement to the LAD X 2 Most recently in 2015 for in-stent restenosis.  She is known to have peripheral arterial disease status post left common iliac artery stent placement followed by left common femoral artery resection and bypass from left external iliac to the left profunda done by Leah Ferrell in 2015. She is known to have chronically occluded bilateral SFAs being managed medically.  She had worsening left leg claudication in 2018 with elevated velocity at the proximal anastomosis of the graft.   Angiography in March 2018 showed mild to moderate calcified common iliac artery disease with patent stent in the left common iliac artery. There was severe stenosis in the left common femoral artery at the proximal anastomosis of the bypass to the profunda. I performed successful angioplasty and drug-coated balloon angioplasty.   She is status post bilateral renal artery stenting in January 2021 due to refractory hypertension and recurrent heart failure.  She had significant improvement in blood pressure control as well as heart failure symptoms since then.    She was seen in March of this year for worsening right calf claudication as well her a small nonhealing ulceration on the lateral side of the small toe.  Doppler studies showed moderately reduced ABI bilaterally.  Toe pressure on the right side was not detectable.   In addition, she reported worsening anginal chest pain especially while working out on the treadmill.  Lexiscan Myoview showed evidence of prior  anterolateral, lateral and inferolateral infarct with large peri-infarct ischemia.  Overall high risk stress test. I proceeded with cardiac catheterization as well as lower extremity angiography in March.  Cardiac catheterization showed heavily calcified coronary arteries with significant three-vessel coronary artery disease with patent stents in the LAD but significant restenosis in the proximal stent, severe ostial stenosis in a large second diagonal and severe ostial stenosis in the left circumflex and moderately diffuse disease in the right coronary artery. Lower extremity angiography showed patent renal artery stents with significant restenosis on the right side, severe bilateral heavily calcified ostial common iliac artery disease extending into the distal aorta.  The iliac arteries were treated by successful kissing stent placement.  On the right side, there was moderate common femoral artery stenosis as well as chronically occluded SFA with reconstitution distally via collaterals from the profunda, short occlusion of above-the-knee popliteal artery with collaterals and three-vessel runoff below the knee.  On the left side, there was flush occlusion of the SFA with collaterals from the profunda and two-vessel runoff below the knee.  She underwent CABG in May with LIMA to LAD, SVG to OM and SVG to diagonal.  She had sternal wound infection that improved with antibiotics.  She has been doing well with no anginal symptoms.  No significant leg claudication but she does have some swelling in her right leg where the vein was harvested.  In addition, she has some discomfort in the toes.  Past Medical History:  Diagnosis Date   CAD (coronary artery disease)    a. 04/2012 NSTEMI: s/p  DES to LAD.   Chronic diastolic CHF (congestive heart failure) (HCC)    Diabetes mellitus without complication (HCC)    Dysrhythmia    Environmental and seasonal allergies    Hyperlipidemia    Hypertension    Leukocytosis     Morbid obesity (HCC)    NSTEMI (non-ST elevated myocardial infarction) ([place]) 04/27/2012   PONV (postoperative nausea and vomiting)    PVD (peripheral vascular disease) ([place])    a. 04/2012 ABI: R 0.49, L 0.39. Angiography 06/14: Significant ostial left common iliac artery stenosis extending into the distal aorta, severe left common femoral artery stenosis, bilateral SFA occlusion with heavy calcifications. Functionally, one-vessel runoff bilaterally below the knee   Renal artery stenosis (HCC)    s/p angioplasty/stenting bilateral renal arteries 03/16/19   Shortness of breath     Past Surgical History:  Procedure Laterality Date   ABDOMINAL AORTAGRAM N/A 07/21/2012   Procedure: ABDOMINAL Leah Ferrell;  Surgeon: Leah Hampshire, MD;  Location: [facility]ATH [facility];  Service: Cardiovascular;  Laterality: N/A;   ABDOMINAL AORTOGRAM W/LOWER EXTREMITY N/A 05/09/2020   Procedure: ABDOMINAL AORTOGRAM W/LOWER EXTREMITY;  Surgeon: Leah Hampshire, MD;  Location: [facility] CV [facility];  Service: Cardiovascular;  Laterality: N/A;   CARDIAC CATHETERIZATION     stents X 2   CORONARY ANGIOGRAM  04/27/2012   Procedure: CORONARY ANGIOGRAM;  Surgeon: Leah Headings, MD;  Location: [facility] CATH LAB;  Service: Cardiovascular;;   CORONARY ARTERY BYPASS GRAFT N/A 06/25/2020   Procedure: CORONARY ARTERY BYPASS GRAFTING (CABG)X3. LEFT INTERNAL MAMMARY ARTERY. RIGHT ENDOSCOPIC SAPHENOUS VEIN HARVESTING.;  Surgeon: Leah Olds, MD;  Location: [facility];  Service: Open Heart Surgery;  Laterality: N/A;   ENDARTERECTOMY FEMORAL Left 11/02/2013   Procedure: RESECTION LEFT COMMON FEMORAL ARTERY AND INSERTION OF INTERPOSITION 55m HEMASHIELD GRAFT FROM LEFT EXTERNAL  ILIAC ARTERY TO LEFT PROFUNDA  FEMORIS ARTERY;  Surgeon: Leah Misty MD;  [facility]  Service: Vascular;  Laterality: Left;   EYE SURGERY Right 2017   FRACTURE SURGERY Right Jul 07, 2014   Right [place]-  [place]  [facility]  by Heat duct   ILIAC ARTERY STENT Left 08/31/2013    LAD stent     LEFT HEART CATH AND CORONARY ANGIOGRAPHY N/A 05/09/2020   Procedure: LEFT HEART CATH AND CORONARY ANGIOGRAPHY;  Surgeon: AWellington Hampshire MD;  [facility];  Service: Cardiovascular;  Laterality: N/A;   LEFT HEART CATHETERIZATION WITH CORONARY ANGIOGRAM N/A 04/23/2012   Procedure: LEFT HEART CATHETERIZATION WITH CORONARY ANGIOGRAM;  Surgeon: Peter M JMartinique MD;  [facility];  Service: Cardiovascular;  Laterality: N/A;   LOWER EXTREMITY ANGIOGRAM Left 08/31/2013   Procedure: LOWER EXTREMITY ANGIOGRAM;  Surgeon: MWellington Hampshire MD;  [facility];  Service: Cardiovascular;  Laterality: Left;   PERCUTANEOUS STENT INTERVENTION Left 08/31/2013   Procedure: PERCUTANEOUS STENT INTERVENTION;  Surgeon: MWellington Hampshire MD;  [facility];  Service: Cardiovascular;  Laterality: Left;  Common Iliac artery   PERIPHERAL VASCULAR CATHETERIZATION N/A 03/19/2016   Procedure: Abdominal Aortogram w/Lower Extremity;  Surgeon: MWellington Hampshire MD;  [facility];  Service: Cardiovascular;  Laterality: N/A;   PERIPHERAL VASCULAR CATHETERIZATION  03/19/2016   Procedure: Peripheral Vascular Balloon Angioplasty-CFA Left;  Surgeon: MWellington Hampshire MD;  [facility];  Service: Cardiovascular;;   PERIPHERAL VASCULAR INTERVENTION Bilateral 03/16/2019   Procedure: PERIPHERAL VASCULAR INTERVENTION;  Surgeon: AWellington Hampshire MD;  [facility];  Service: Cardiovascular;  Laterality: Bilateral;  RENAL   PERIPHERAL VASCULAR INTERVENTION Bilateral 05/09/2020   Procedure: PERIPHERAL VASCULAR INTERVENTION;  Surgeon: Leah Hampshire, MD;  Location: [facility] CV LAB;  Service: Cardiovascular;  Laterality: Bilateral;  Iliac   RENAL ANGIOGRAPHY  03/16/2019   RENAL ANGIOGRAPHY Bilateral 03/16/2019   Procedure: RENAL ANGIOGRAPHY;  Surgeon: Leah Hampshire, MD;  Location: [place] CV LAB;  Service: Cardiovascular;  Laterality:  Bilateral;   TEE WITHOUT CARDIOVERSION N/A 06/25/2020   Procedure: TRANSESOPHAGEAL ECHOCARDIOGRAM (TEE);  Surgeon: Leah Olds, MD;  Location: [facility];  Service: Open Heart Surgery;  Laterality: N/A;   TUMOR REMOVAL     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 20 tablet 0   albuterol (PROVENTIL HFA) 108 (90 Base) MCG/ACT inhaler Inhale 1-2 puffs into the lungs every 6 (six) hours as needed for wheezing or shortness of breath.     amLODipine (NORVASC) 5 MG tablet Take 1 tablet (5 mg total) by mouth daily. 90 tablet 2   Ascorbic Acid (VITAMIN C) 1000 MG tablet Take 1,000 mg by mouth 3 (three) times daily.      aspirin EC 81 MG tablet Take 81 mg by mouth daily. In the morning     BD ULTRA-FINE PEN NEEDLES 29G X 12.7MM MISC USE PENNEEDLES 3 TIMES  DAILY AS DIRECTED 270 each 1   blood glucose meter kit and supplies Dispense based on patient and insurance preference. Use up to four times daily as directed. (FOR ICD-10 E10.9, E11.9). 1 each 0   Calcium Carbonate-Vit D-Min (CALTRATE 600+D PLUS MINERALS) 600-800 MG-UNIT TABS Take 1 tablet by mouth 3 (three) times daily.      carvedilol (COREG) 6.25 MG tablet Take 1 tablet by mouth twice daily (Patient taking differently: Take 6.25 mg by mouth 2 (two) times daily with a meal.) 60 tablet 3   Cholecalciferol (VITAMIN D-3) 125 MCG (5000 UT) TABS Take 5,000 Units by mouth daily.     clopidogrel (PLAVIX) 75 MG tablet Take 1 tablet (75 mg total) by mouth daily. 30 tablet 1   fish oil-omega-3 fatty acids 1000 MG capsule Take 1 g by mouth daily.      glucose blood (GLUCOSE METER TEST) test strip Use 6-10 times a day, Uses ReliOn 900 each 12   halobetasol (ULTRAVATE) 0.05 % cream Apply 1 application topically daily as needed (For rash).     HUMALOG KWIKPEN 200 UNIT/ML  KwikPen Sliding Scales     hydroxypropyl methylcellulose (ISOPTO TEARS) 2.5 % ophthalmic solution Place 1 drop into both eyes 3 (three) times daily as needed (for dry eyes).     Insulin Syringe 27G X 1/2" 0.5 ML MISC Use with Fiasp insulin TID 100 each 3   lisinopril (ZESTRIL) 20 MG tablet Take 1 tablet by mouth twice daily 180 tablet 0   montelukast (SINGULAIR) 10 MG tablet Take 1 tablet by mouth once daily with breakfast 90 tablet 2   ondansetron (ZOFRAN ODT) 4 MG disintegrating tablet Take 1 tablet (4 mg total) by mouth every 8 (eight) hours as needed for nausea or vomiting. 20 tablet 0   RELION PEN NEEDLES 29G X 12MM MISC USE 1 NEEDLE TO INJECT INSULIN SUBCUTANEOUSLY 3 TIMES DAILY (Patient taking differently: 1 Device  by Subconjunctival route 3 (three) times daily.) 50 each 5   rosuvastatin (CRESTOR) 40 MG tablet Take 1 tablet (40 mg total) by mouth daily. 90 tablet 3   Semaglutide,0.25 or 0.5MG /DOS, (OZEMPIC, 0.25 OR 0.5 MG/DOSE,) 2 MG/1.5ML SOPN Inject 1 mg into the skin once a week. Friday     No current facility-administered medications for this visit.    Allergies:   Tape    Social History:  The patient  reports that she quit smoking about 19 years ago. Her smoking use included cigarettes. She started smoking about 56 years ago. She has a 36.00 pack-year smoking history. She has never used smokeless tobacco. She reports that she does not drink alcohol and does not use drugs.   Family History:  The patient's family history includes Alcohol abuse in her brother; Diabetes in her brother, daughter, maternal grandmother, mother, and son; Heart attack in her maternal grandfather; Heart disease in her brother; Hyperlipidemia in her mother and son; Hypertension in her daughter, mother, and son; Other in her sister and sister; Peripheral vascular disease in her mother.    ROS:  Please see the history of present illness.   Otherwise, review of systems are positive for none.   All other systems  are reviewed and negative.    PHYSICAL EXAM: VS:  BP (!) 154/57   Pulse 68   Ht 5' 2.5" (1.588 m)   Wt 202 lb 9.6 oz (91.9 kg)   SpO2 97%   BMI 36.47 kg/m  , BMI Body mass index is 36.47 kg/m. GEN: Well nourished, well developed, in no acute distress  HEENT: normal  Neck: no JVD, carotid bruits, or masses Cardiac: RRR; no  rubs, or gallops , trace edema . There is 2/6 systolic ejection murmur in the aortic area.  Sternal wound appears well-healed at the present time. Respiratory:  clear to auscultation bilaterally, normal work of breathing GI: soft, nontender, nondistended, + BS MS: no deformity or atrophy  Skin: warm and dry, no rash Neuro:  Strength and sensation are intact Psych: euthymic mood, full affect Distal pulses are not palpable.    EKG:  EKG is  not ordered today.    Recent Labs: 06/26/2020: Magnesium 2.2 08/23/2020: ALT 18; BUN 8; Creatinine, Ser 0.69; Hemoglobin 13.1; Platelets 301; Potassium 4.4; Sodium 143    Lipid Panel    Component Value Date/Time   CHOL 150 03/20/2020 1222   TRIG 137 03/20/2020 1222   TRIG 135 09/16/2012 1322   HDL 60 03/20/2020 1222   HDL 60 09/16/2012 1322   CHOLHDL 2.5 03/20/2020 1222   CHOLHDL 3.2 05/06/2012 1713   VLDL 32 05/06/2012 1713   LDLCALC 66 03/20/2020 1222   LDLCALC 92 09/16/2012 1322      Wt Readings from Last 3 Encounters:  09/25/20 202 lb 9.6 oz (91.9 kg)  08/23/20 200 lb (90.7 kg)  08/01/20 197 lb (89.4 kg)       No flowsheet data found.    ASSESSMENT AND PLAN:  1. Peripheral arterial disease: Status post  kissing stent placement to the common iliac arteries due to severe right calf claudication as well as nonhealing ulceration on the right small toe.  No recurrent ulceration.  Repeat ABI and aortoiliac duplex in October.  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  Blood pressure is reasonably controlled.  Most recent angiography did show significant restenosis on the  right side but no plan for revascularization at this time given that left renal  artery stent looked reasonable and blood pressure is reasonably controlled.  We can consider repeat renal artery duplex next year.  3. Coronary artery disease involving native coronary arteries with progressive angina: Status post CABG and seems to be doing reasonably well.  Sternal wound infection seems to have healed.      4. Hyperlipidemia: Continue high-dose rosuvastatin with a target LDL of less than 70.  Most recent lipid profile showed an LDL of 66.  5.  Chronic diastolic heart failure: was in the setting of uncontrolled hypertension and severe bilateral renal artery stenosis.  Currently not requiring a loop diuretic since she had her renal artery stents.   Disposition:   FU with me in 4  month  Signed,  [name], MD  09/26/2020 10:44 AM    [place]

## 2020-09-25 NOTE — Patient Instructions (Signed)
Medication Instructions:  No changes *If you need a refill on your cardiac medications before your next appointment, please call your pharmacy*   Lab Work: None ordered If you have labs (blood work) drawn today and your tests are completely normal, you will receive your results only by: [place] (if you have MyChart) OR A paper copy in the mail If you have any lab test that is abnormal or we need to change your treatment, we will call you to review the results.   Testing/Procedures: Your physician has requested that you have an Aorta/Iliac Duplex in October. This will be take place in [place] No food after 11PM the night before.  Water is OK. (Don't drink liquids if you have been instructed not to for ANOTHER test). Take two Extra-Strength Gas-X capsules at bedtime the night before test.   Take an additional two Extra-Strength Gas-X capsules three (3) hours before the test or first thing in the morning.   Avoid foods that produce bowel gas, for 24 hours prior to exam (see below).   No breakfast, no chewing gum, no smoking or carbonated beverages. Patient may take morning medications with water. Come in for test at least 15 minutes early to register.  Your physician has requested that you have an ankle brachial index (ABI) in October 2022. During this test an ultrasound and blood pressure cuff are used to evaluate the arteries that supply the arms and legs with blood. Allow thirty minutes for this exam. There are no restrictions or special instructions. This will take place in [place].   Follow-Up: At [facility], you and your health needs are our priority.  As part of our continuing mission to provide you with exceptional heart care, we have created designated Provider Care Teams.  These Care Teams include your primary Cardiologist (physician) and Advanced Practice Providers (APPs -  Physician Assistants and Nurse Practitioners) who all work together to provide you with the care you  need, when you need it.  We recommend signing up for the patient portal called "MyChart".  Sign up information is provided on this After Visit Summary.  MyChart is used to connect with patients for Virtual Visits (Telemedicine).  Patients are able to view lab/test results, encounter notes, upcoming appointments, etc.  Non-urgent messages can be sent to your provider as well.   To learn more about what you can do with MyChart, go to [email or web address].    Your next appointment:   4 month(s)  The format for your next appointment:   In Person  Provider:   [name], MD

## 2020-09-26 DIAGNOSIS — E113393 Type 2 diabetes mellitus with moderate nonproliferative diabetic retinopathy without macular edema, bilateral: Secondary | ICD-10-CM | POA: Diagnosis not present

## 2020-09-26 DIAGNOSIS — H25812 Combined forms of age-related cataract, left eye: Secondary | ICD-10-CM | POA: Diagnosis not present

## 2020-09-26 DIAGNOSIS — H26491 Other secondary cataract, right eye: Secondary | ICD-10-CM | POA: Diagnosis not present

## 2020-09-26 LAB — HM DIABETES EYE EXAM

## 2020-10-02 ENCOUNTER — Other Ambulatory Visit: Payer: Self-pay | Admitting: Family

## 2020-10-02 MED ORDER — OZEMPIC (0.25 OR 0.5 MG/DOSE) 2 MG/1.5ML ~~LOC~~ SOPN: 1.0000 mg | PEN_INJECTOR | SUBCUTANEOUS | Status: DC

## 2020-10-02 NOTE — Telephone Encounter (Signed)
  Prescription Request  10/02/2020  What is the name of the medication or equipment? Ozempic  Have you contacted your pharmacy to request a refill? (if applicable) yes  Which pharmacy would you like this sent to?  [facility]   Patient notified that their request is being sent to the clinical staff for review and that they should receive a response within 2 business days.

## 2020-10-04 MED ORDER — OZEMPIC (0.25 OR 0.5 MG/DOSE) 2 MG/1.5ML ~~LOC~~ SOPN: 1.0000 mg | PEN_INJECTOR | SUBCUTANEOUS | 1 refills | Status: DC

## 2020-10-04 NOTE — Addendum Note (Signed)
Addended by: Antonietta Barcelona D on: 10/04/2020 08:17 AM   Modules accepted: Orders

## 2020-10-04 NOTE — Telephone Encounter (Addendum)
Refill was set to No Print, resent to pharmacy, pt aware

## 2020-10-09 ENCOUNTER — Ambulatory Visit: Payer: Medicare Other | Admitting: Cardiovascular Disease

## 2020-10-30 ENCOUNTER — Other Ambulatory Visit: Payer: Self-pay | Admitting: Family

## 2020-10-30 DIAGNOSIS — Z1231 Encounter for screening mammogram for malignant neoplasm of breast: Secondary | ICD-10-CM

## 2020-11-07 ENCOUNTER — Other Ambulatory Visit: Payer: Self-pay | Admitting: Surgical

## 2020-11-12 ENCOUNTER — Other Ambulatory Visit: Payer: Self-pay | Admitting: Surgical

## 2020-11-13 ENCOUNTER — Other Ambulatory Visit: Payer: Self-pay | Admitting: *Deleted

## 2020-11-14 ENCOUNTER — Telehealth: Payer: Self-pay | Admitting: Family

## 2020-11-14 NOTE — Telephone Encounter (Signed)
Pt has questions about her medicines. Please call back and advise.

## 2020-11-14 NOTE — Telephone Encounter (Signed)
Pt needs refills of Plavix. States that Dr. Fletcher [name] retired and is not going to refill the medication. Pt would like for Christy to take over medication. She is fine to wait until Alyse Low is in the office tomorrow. Pt states that she has 5 pills left.

## 2020-11-15 MED ORDER — CLOPIDOGREL BISULFATE 75 MG PO TABS: 75.0000 mg | ORAL_TABLET | Freq: Every day | ORAL | 1 refills | Status: DC

## 2020-11-15 NOTE — Telephone Encounter (Signed)
Prescription sent to pharmacy.

## 2020-11-15 NOTE — Telephone Encounter (Signed)
Patient aware and verbalized understanding. °

## 2020-11-21 ENCOUNTER — Other Ambulatory Visit: Payer: Self-pay

## 2020-11-21 ENCOUNTER — Ambulatory Visit (HOSPITAL_BASED_OUTPATIENT_CLINIC_OR_DEPARTMENT_OTHER)
Admission: RE | Admit: 2020-11-21 | Discharge: 2020-11-21 | Disposition: A | Discharge status: Home / Self Care | Payer: Medicare Other | Source: Ambulatory Visit | Attending: Cardiovascular Disease | Admitting: Cardiovascular Disease

## 2020-11-21 ENCOUNTER — Ambulatory Visit (HOSPITAL_COMMUNITY)
Admission: RE | Admit: 2020-11-21 | Discharge: 2020-11-21 | Disposition: A | Discharge status: Home / Self Care | Payer: Medicare Other | Source: Ambulatory Visit | Attending: Cardiovascular Disease | Admitting: Cardiovascular Disease

## 2020-11-21 DIAGNOSIS — Z95828 Presence of other vascular implants and grafts: Secondary | ICD-10-CM | POA: Insufficient documentation

## 2020-11-21 DIAGNOSIS — I739 Peripheral vascular disease, unspecified: Secondary | ICD-10-CM

## 2020-11-23 ENCOUNTER — Other Ambulatory Visit: Payer: Self-pay

## 2020-11-23 ENCOUNTER — Encounter: Payer: Self-pay | Admitting: Family

## 2020-11-23 ENCOUNTER — Ambulatory Visit (INDEPENDENT_AMBULATORY_CARE_PROVIDER_SITE_OTHER): Payer: Medicare Other | Admitting: Family

## 2020-11-23 ENCOUNTER — Ambulatory Visit (INDEPENDENT_AMBULATORY_CARE_PROVIDER_SITE_OTHER): Payer: Medicare Other

## 2020-11-23 VITALS — Ht 63.0 in | Wt 202.0 lb

## 2020-11-23 VITALS — BP 150/76 | HR 72 | Temp 97.6°F | Ht 62.5 in | Wt 202.0 lb

## 2020-11-23 DIAGNOSIS — E139 Other specified diabetes mellitus without complications: Secondary | ICD-10-CM | POA: Diagnosis not present

## 2020-11-23 DIAGNOSIS — E1159 Type 2 diabetes mellitus with other circulatory complications: Secondary | ICD-10-CM | POA: Diagnosis not present

## 2020-11-23 DIAGNOSIS — I5033 Acute on chronic diastolic (congestive) heart failure: Secondary | ICD-10-CM | POA: Diagnosis not present

## 2020-11-23 DIAGNOSIS — I2510 Atherosclerotic heart disease of native coronary artery without angina pectoris: Secondary | ICD-10-CM | POA: Diagnosis not present

## 2020-11-23 DIAGNOSIS — I5032 Chronic diastolic (congestive) heart failure: Secondary | ICD-10-CM | POA: Diagnosis not present

## 2020-11-23 DIAGNOSIS — I152 Hypertension secondary to endocrine disorders: Secondary | ICD-10-CM

## 2020-11-23 DIAGNOSIS — Z951 Presence of aortocoronary bypass graft: Secondary | ICD-10-CM | POA: Diagnosis not present

## 2020-11-23 DIAGNOSIS — R0989 Other specified symptoms and signs involving the circulatory and respiratory systems: Secondary | ICD-10-CM

## 2020-11-23 DIAGNOSIS — Z1211 Encounter for screening for malignant neoplasm of colon: Secondary | ICD-10-CM

## 2020-11-23 DIAGNOSIS — E1151 Type 2 diabetes mellitus with diabetic peripheral angiopathy without gangrene: Secondary | ICD-10-CM

## 2020-11-23 DIAGNOSIS — E1142 Type 2 diabetes mellitus with diabetic polyneuropathy: Secondary | ICD-10-CM

## 2020-11-23 DIAGNOSIS — I70229 Atherosclerosis of native arteries of extremities with rest pain, unspecified extremity: Secondary | ICD-10-CM | POA: Diagnosis not present

## 2020-11-23 DIAGNOSIS — Z955 Presence of coronary angioplasty implant and graft: Secondary | ICD-10-CM | POA: Diagnosis not present

## 2020-11-23 DIAGNOSIS — I739 Peripheral vascular disease, unspecified: Secondary | ICD-10-CM

## 2020-11-23 DIAGNOSIS — Z0000 Encounter for general adult medical examination without abnormal findings: Secondary | ICD-10-CM

## 2020-11-23 DIAGNOSIS — Z794 Long term (current) use of insulin: Secondary | ICD-10-CM

## 2020-11-23 LAB — BAYER DCA HB A1C WAIVED: HB A1C (BAYER DCA - WAIVED): 8.5 % — ABNORMAL HIGH (ref 4.8–5.6)

## 2020-11-23 NOTE — Progress Notes (Signed)
Subjective:    Patient ID: Leah Ferrell, female    DOB: 1950-12-30, 70 y.o.   MRN: [id number]  Chief Complaint  Patient presents with   Medical Management of Chronic Issues   Pt presents to the office today for chronic follow up. Pt has CHF, CAD and hx MI and is followed by Cardiologists every 6 months. PT is followed by Vascular  for PVD and PAD. PT states these are stable.    Pt had a renal angiography on 03/16/19. She reports her swelling is improved and her BP is improved.     She had critical lower limb ischemia and had abdominal aortogram with lower extremity. 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 She reports she had some discharge, erythemas. She was seen in our office on 08/01/20 and given Bactrim for 10 days. She reports the area is improved, but still draining a clear, milky substance at times.    Pt is morbid obese because her BMI is 38 and has DM and HTN.  Hypertension This is a chronic problem. The current episode started more than 1 year ago. The problem has been waxing and waning since onset. The problem is uncontrolled. Associated symptoms include blurred vision, malaise/fatigue, peripheral edema and shortness of breath. Risk factors for coronary artery disease include dyslipidemia, diabetes mellitus and obesity. The current treatment provides moderate improvement. Hypertensive end-organ damage includes kidney disease, CAD/MI and heart failure.  Diabetes She presents for her follow-up diabetic visit. She has type 2 diabetes mellitus. There are no hypoglycemic associated symptoms. Associated symptoms include blurred vision, fatigue and foot paresthesias. Symptoms are stable. Diabetic complications include heart disease. Risk factors for coronary artery disease include dyslipidemia, diabetes mellitus, hypertension, sedentary lifestyle and post-menopausal. She is following a generally unhealthy diet. Her overall blood glucose range is 180-200 mg/dl.  Congestive Heart Failure Presents for  follow-up visit. Associated symptoms include edema, fatigue and shortness of breath. The symptoms have been stable.     Review of Systems  Constitutional:  Positive for fatigue and malaise/fatigue.  Eyes:  Positive for blurred vision.  Respiratory:  Positive for shortness of breath.   All other systems reviewed and are negative.     Objective:   Physical Exam Vitals reviewed.  Constitutional:      General: She is not in acute distress.    Appearance: She is well-developed.  HENT:     Head: Normocephalic and atraumatic.     Right Ear: Tympanic membrane normal.     Left Ear: Tympanic membrane normal.  Eyes:     Pupils: Pupils are equal, round, and reactive to light.  Neck:     Thyroid: No thyromegaly.  Cardiovascular:     Rate and Rhythm: Normal rate and regular rhythm.     Heart sounds: Normal heart sounds. No murmur heard. Pulmonary:     Effort: Pulmonary effort is normal. No respiratory distress.     Breath sounds: Normal breath sounds. No wheezing.  Abdominal:     General: Bowel sounds are normal. There is no distension.     Palpations: Abdomen is soft.     Tenderness: There is no abdominal tenderness.  Musculoskeletal:        General: No tenderness. Normal range of motion.     Cervical back: Normal range of motion and neck supple.     Right lower leg: Edema (trace) present.     Left lower leg: Edema (trace) present.  Skin:    General: Skin is warm and  dry.  Neurological:     Mental Status: She is alert and oriented to person, place, and time.     Cranial Nerves: No cranial nerve deficit.     Deep Tendon Reflexes: Reflexes are normal and symmetric.  Psychiatric:        Behavior: Behavior normal.        Thought Content: Thought content normal.        Judgment: Judgment normal.      BP (!) 150/76   Pulse 72   Temp 97.6 F (36.4 C) (Temporal)   Ht 5' 2.5" (1.588 m)   Wt 202 lb (91.6 kg)   BMI 36.36 kg/m      Assessment & Plan:  Leah Ferrell comes  in today with chief complaint of Medical Management of Chronic Issues   Diagnosis and orders addressed:  1. Diabetes mellitus with peripheral vascular disease ([place])  2. Diabetes 1.5, managed as type 2 ([place]) - CBC with Differential/Platelet - CMP14+EGFR - Lipid panel - Bayer DCA Hb A1c Waived  3. Acute on chronic diastolic CHF (congestive heart failure) ([place])  4. Critical lower limb ischemia (HCC)  5. Hypertension associated with diabetes ([place])  6. Coronary artery disease involving native coronary artery of native heart without angina pectoris  7. PVD (peripheral vascular disease) ([place])  8. PAD (peripheral artery disease) ([place])  9. Chronic diastolic heart failure ([place])  10. Type 2 diabetes mellitus with diabetic polyneuropathy, with long-term current use of insulin ([place])  11. Morbid obesity ([place])  12. S/P primary angioplasty with coronary stent  13. S/P CABG x 3  14. Bilateral carotid bruits    Labs pending Health Maintenance reviewed Diet and exercise encouraged  Follow up plan: 3 months  and keep specialists   Evelina Dun, FNP

## 2020-11-23 NOTE — Progress Notes (Signed)
Subjective:   Leah Ferrell is a 70 y.o. female who presents for Medicare Annual (Subsequent) preventive examination.  Virtual Visit via Telephone Note  I connected with  Leah Ferrell on 11/23/20 at  4:15 PM EDT by telephone and verified that I am speaking with the correct person using two identifiers.  Location: Patient: Home Provider: [facility] Persons participating in the virtual visit: patient/Nurse Health Advisor   I discussed the limitations, risks, security and privacy concerns of performing an evaluation and management service by telephone and the availability of in person appointments. The patient expressed understanding and agreed to proceed.  Interactive audio and video telecommunications were attempted between this nurse and patient, however failed, due to patient having technical difficulties OR patient did not have access to video capability.  We continued and completed visit with audio only.  Some vital signs may be absent or patient reported.   John Vasconcelos E Ilena Dieckman, LPN   Review of Systems     Cardiac Risk Factors include: advanced age (>59mn, >>84women);diabetes mellitus;dyslipidemia;hypertension;obesity (BMI >30kg/m2);family history of premature cardiovascular disease;Other (see comment), Risk factor comments: hx of NSTEMI, peripheral vascular disease, CHF, atherosclerosis     Objective:    Today's Vitals   11/23/20 1611 11/23/20 1612  Weight: 202 lb (91.6 kg)   Height: _0  (1.6 m)   PainSc:  4    Body mass index is 35.78 kg/m.  Advanced Directives 11/23/2020 06/30/2020 06/21/2020 05/09/2020 03/16/2019 03/16/2019 12/20/2018  Does Patient Have a Medical Advance Directive? _1  No No  Type of Advance Directive - - - - - - -  Copy of [place]? - - - - - - -  Would patient like information on creating a medical advance directive? No - Patient declined No - Patient declined - No - Patient declined No - Patient declined No -  Patient declined No - Patient declined    Current Medications (verified) Outpatient Encounter Medications as of 11/23/2020  Medication Sig   acetaminophen (TYLENOL) 325 MG tablet Take 2 tablets (650 mg total) by mouth every 6 (six) hours as needed for mild pain (or Fever >/= 101).   albuterol (PROVENTIL HFA) 108 (90 Base) MCG/ACT inhaler Inhale 1-2 puffs into the lungs every 6 (six) hours as needed for wheezing or shortness of breath.   amLODipine (NORVASC) 5 MG tablet Take 1 tablet (5 mg total) by mouth daily.   Ascorbic Acid (VITAMIN C) 1000 MG tablet Take 1,000 mg by mouth 3 (three) times daily.    aspirin EC 81 MG tablet Take 81 mg by mouth daily. In the morning   BD ULTRA-FINE PEN NEEDLES 29G X 12.7MM MISC USE PENNEEDLES 3 TIMES  DAILY AS DIRECTED   blood glucose meter kit and supplies Dispense based on patient and insurance preference. Use up to four times daily as directed. (FOR ICD-10 E10.9, E11.9).   Calcium Carbonate-Vit D-Min (CALTRATE 600+D PLUS MINERALS) 600-800 MG-UNIT TABS Take 1 tablet by mouth 3 (three) times daily.    carvedilol (COREG) 6.25 MG tablet Take 1 tablet by mouth twice daily (Patient taking differently: Take 6.25 mg by mouth 2 (two) times daily with a meal.)   Cholecalciferol (VITAMIN D-3) 125 MCG (5000 UT) TABS Take 5,000 Units by mouth daily.   clopidogrel (PLAVIX) 75 MG tablet Take 1 tablet (75 mg total) by mouth daily.   fish oil-omega-3 fatty acids 1000 MG capsule Take 1 g by mouth daily.  glucose blood (GLUCOSE METER TEST) test strip Use 6-10 times a day, Uses ReliOn   halobetasol (ULTRAVATE) 0.05 % cream Apply 1 application topically daily as needed (For rash).   HUMALOG KWIKPEN 200 UNIT/ML KwikPen Sliding Scales   hydroxypropyl methylcellulose (ISOPTO TEARS) 2.5 % ophthalmic solution Place 1 drop into both eyes 3 (three) times daily as needed (for dry eyes).   Insulin Syringe 27G X 1/2" 0.5 ML MISC Use with Fiasp insulin TID   lisinopril (ZESTRIL) 20 MG  tablet Take 1 tablet by mouth twice daily   montelukast (SINGULAIR) 10 MG tablet Take 1 tablet by mouth once daily with breakfast   ondansetron (ZOFRAN ODT) 4 MG disintegrating tablet Take 1 tablet (4 mg total) by mouth every 8 (eight) hours as needed for nausea or vomiting.   RELION PEN NEEDLES 29G X 12MM MISC USE 1 NEEDLE TO INJECT INSULIN SUBCUTANEOUSLY 3 TIMES DAILY (Patient taking differently: 1 Device by Subconjunctival route 3 (three) times daily.)   rosuvastatin (CRESTOR) 40 MG tablet Take 1 tablet (40 mg total) by mouth daily.   Semaglutide,0.25 or 0.5MG/DOS, (OZEMPIC, 0.25 OR 0.5 MG/DOSE,) 2 MG/1.5ML SOPN Inject 1 mg into the skin once a week. Friday   No facility-administered encounter medications on file as of 11/23/2020.    Allergies (verified) Tape   History: Past Medical History:  Diagnosis Date   CAD (coronary artery disease)    a. 04/2012 NSTEMI: s/p DES to LAD.   Chronic diastolic CHF (congestive heart failure) (HCC)    Diabetes mellitus without complication (HCC)    Dysrhythmia    Environmental and seasonal allergies    Hyperlipidemia    Hypertension    Leukocytosis    Morbid obesity (HCC)    NSTEMI (non-ST elevated myocardial infarction) ([place]) 04/27/2012   PONV (postoperative nausea and vomiting)    PVD (peripheral vascular disease) ([place])    a. 04/2012 ABI: R 0.49, L 0.39. Angiography 06/14: Significant ostial left common iliac artery stenosis extending into the distal aorta, severe left common femoral artery stenosis, bilateral SFA occlusion with heavy calcifications. Functionally, one-vessel runoff bilaterally below the knee   Renal artery stenosis (HCC)    s/p angioplasty/stenting bilateral renal arteries 03/16/19   Shortness of breath    Past Surgical History:  Procedure Laterality Date   ABDOMINAL AORTAGRAM N/A 07/21/2012   Procedure: ABDOMINAL Maxcine Ham;  Surgeon: Wellington Hampshire, MD;  Location: [facility] CATH LAB;  Service: Cardiovascular;  Laterality: N/A;    ABDOMINAL AORTOGRAM W/LOWER EXTREMITY N/A 05/09/2020   Procedure: ABDOMINAL AORTOGRAM W/LOWER EXTREMITY;  Surgeon: Wellington Hampshire, MD;  Location: [place] CV LAB;  Service: Cardiovascular;  Laterality: N/A;   CARDIAC CATHETERIZATION     stents X 2   CORONARY ANGIOGRAM  04/27/2012   Procedure: CORONARY ANGIOGRAM;  Surgeon: Thayer Headings, MD;  Location: [facility] CATH LAB;  Service: Cardiovascular;;   CORONARY ARTERY BYPASS GRAFT N/A 06/25/2020   Procedure: CORONARY ARTERY BYPASS GRAFTING (CABG)X3. LEFT INTERNAL MAMMARY ARTERY. RIGHT ENDOSCOPIC SAPHENOUS VEIN HARVESTING.;  Surgeon: Wonda Olds, MD;  Location: [facility];  Service: Open Heart Surgery;  Laterality: N/A;   ENDARTERECTOMY FEMORAL Left 11/02/2013   Procedure: RESECTION LEFT COMMON FEMORAL ARTERY AND INSERTION OF INTERPOSITION 59m HEMASHIELD GRAFT FROM LEFT EXTERNAL  ILIAC ARTERY TO LEFT PROFUNDA  FEMORIS ARTERY;  Surgeon: JMal Misty MD;  [place] [facility]  Service: Vascular;  Laterality: Left;   EYE SURGERY Right 2017   FRACTURE SURGERY Right Jul 07, 2014   Right  Foot-  [place]  [facility]  by Heat duct   ILIAC ARTERY STENT Left 08/31/2013   LAD stent     LEFT HEART CATH AND CORONARY ANGIOGRAPHY N/A 05/09/2020   Procedure: LEFT HEART CATH AND CORONARY ANGIOGRAPHY;  Surgeon: Wellington Hampshire, MD;  Location: [place] CV LAB;  Service: Cardiovascular;  Laterality: N/A;   LEFT HEART CATHETERIZATION WITH CORONARY ANGIOGRAM N/A 04/23/2012   Procedure: LEFT HEART CATHETERIZATION WITH CORONARY ANGIOGRAM;  Surgeon: Peter M Martinique, MD;  Location: [facility] CATH LAB;  Service: Cardiovascular;  Laterality: N/A;   LOWER EXTREMITY ANGIOGRAM Left 08/31/2013   Procedure: LOWER EXTREMITY ANGIOGRAM;  Surgeon: Wellington Hampshire, MD;  Location: [place] [facility] CATH LAB;  Service: Cardiovascular;  Laterality: Left;   PERCUTANEOUS STENT INTERVENTION Left 08/31/2013   Procedure: PERCUTANEOUS STENT INTERVENTION;  Surgeon: Wellington Hampshire, MD;  Location: [place] CATH LAB;  Service:  Cardiovascular;  Laterality: Left;  Common Iliac artery   PERIPHERAL VASCULAR CATHETERIZATION N/A 03/19/2016   Procedure: Abdominal Aortogram w/Lower Extremity;  Surgeon: Wellington Hampshire, MD;  Location: [place] CV LAB;  Service: Cardiovascular;  Laterality: N/A;   PERIPHERAL VASCULAR CATHETERIZATION  03/19/2016   Procedure: Peripheral Vascular Balloon Angioplasty-CFA Left;  Surgeon: Wellington Hampshire, MD;  Location: [place] CV LAB;  Service: Cardiovascular;;   PERIPHERAL VASCULAR INTERVENTION Bilateral 03/16/2019   Procedure: PERIPHERAL VASCULAR INTERVENTION;  Surgeon: Wellington Hampshire, MD;  Location: [place] CV LAB;  Service: Cardiovascular;  Laterality: Bilateral;  RENAL   PERIPHERAL VASCULAR INTERVENTION Bilateral 05/09/2020   Procedure: PERIPHERAL VASCULAR INTERVENTION;  Surgeon: Wellington Hampshire, MD;  Location: [place] CV LAB;  Service: Cardiovascular;  Laterality: Bilateral;  Iliac   RENAL ANGIOGRAPHY  03/16/2019   RENAL ANGIOGRAPHY Bilateral 03/16/2019   Procedure: RENAL ANGIOGRAPHY;  Surgeon: Wellington Hampshire, MD;  Location: [place] CV LAB;  Service: Cardiovascular;  Laterality: Bilateral;   TEE WITHOUT CARDIOVERSION N/A 06/25/2020   Procedure: TRANSESOPHAGEAL ECHOCARDIOGRAM (TEE);  Surgeon: Wonda Olds, MD;  Location: [facility];  Service: Open Heart Surgery;  Laterality: N/A;   TUMOR REMOVAL     tumor removed from [place]   Family History  Problem Relation Age of Onset   Diabetes Mother    Hyperlipidemia Mother    Hypertension Mother    Peripheral vascular disease Mother    Diabetes Maternal Grandmother    Heart attack Maternal Grandfather    Heart disease Brother    Other Sister        drowned   Diabetes Brother    Alcohol abuse Brother    Other Sister        drowned   Diabetes Daughter        borderline   Hypertension Daughter    Diabetes Son    Hypertension Son    Hyperlipidemia Son    Social History   Socioeconomic History   Marital status:  Widowed    Spouse name: Not on file   Number of children: 4   Years of education: completed school in [place]    Highest education level: Not on file  Occupational History   Occupation: retired  Tobacco Use   Smoking status: Former    Packs/day: 1.00    Years: 36.00    Pack years: 36.00    Types: Cigarettes    Start date: 04/21/1964    Quit date: 02/17/2001    Years since quitting: 19.7   Smokeless tobacco: Never  Vaping Use   Vaping Use: Never used  Substance and Sexual Activity   Alcohol use: No    Alcohol/week: 0.0 standard drinks   Drug use: No   Sexual activity: Not Currently    Birth control/protection: Post-menopausal  Other Topics Concern   Not on file  Social History Narrative   Lives alone - one level - all of her children live far away   Social Determinants of Health   Financial Resource Strain: Low Risk    Difficulty of Paying Living Expenses: Not hard at all  Food Insecurity: No Food Insecurity   Worried About Charity fundraiser in the Last Year: Never true   Ran Out of Food in the Last Year: Never true  Transportation Needs: No Transportation Needs   Lack of Transportation (Medical): No   Lack of Transportation (Non-Medical): No  Physical Activity: Sufficiently Active   Days of Exercise per Week: 5 days   Minutes of Exercise per Session: 30 min  Stress: No Stress Concern Present   Feeling of Stress : Not at all  Social Connections: Socially Isolated   Frequency of Communication with Friends and Family: More than three times a week   Frequency of Social Gatherings with Friends and Family: More than three times a week   Attends Religious Services: Never   Marine scientist or Organizations: No   Attends Archivist Meetings: Never   Marital Status: Widowed    Tobacco Counseling Counseling given: Not Answered   Clinical Intake:  Pre-visit preparation completed: Yes  Pain : 0-10 Pain Score: 4  Pain Type: Chronic pain Pain  Location: Leg Pain Orientation: Right, Left Pain Descriptors / Indicators: Aching Pain Onset: More than a month ago Pain Frequency: Intermittent     BMI - recorded: 35.78 Nutritional Status: BMI > 30  Obese Nutritional Risks: None Diabetes: Yes CBG done?: No Did pt. bring in CBG monitor from home?: No  How often do you need to have someone help you when you read instructions, pamphlets, or other written materials from your doctor or pharmacy?: 1 - Never  Diabetic? Yes Nutrition Risk Assessment:  Has the patient had any N/V/D within the last 2 months?  No  Does the patient have any non-healing wounds?  No  Has the patient had any unintentional weight loss or weight gain?  No   Diabetes:  Is the patient diabetic?  Yes  If diabetic, was a CBG obtained today?  No  Did the patient bring in their glucometer from home?  No  How often do you monitor your CBG's? 3-4 times per day - doesn't want CGM.   Financial Strains and Diabetes Management:  Are you having any financial strains with the device, your supplies or your medication? No .  Does the patient want to be seen by Chronic Care Management for management of their diabetes?  No  Would the patient like to be referred to a Nutritionist or for Diabetic Management?  No   Diabetic Exams:  Diabetic Eye Exam: Completed 09/26/2020.   Diabetic Foot Exam: Completed 12/19/2019. Pt has been advised about the importance in completing this exam. Pt is scheduled for diabetic foot exam on 02/22/2021.    Interpreter Needed?: No  Information entered by :: Asia Favata, LPN   Activities of Daily Living In your present state of health, do you have any difficulty performing the following activities: 11/23/2020 06/28/2020  Hearing? N N  Vision? N Y  Difficulty concentrating or making decisions? Y N  Walking or climbing stairs?  Y Y  Dressing or bathing? N N  Doing errands, shopping? N N  Preparing Food and eating ? N -  Using the Toilet? N -   In the past six months, have you accidently leaked urine? Y -  Comment wears pantyliners - mild -  Do you have problems with loss of bowel control? N -  Managing your Medications? N -  Managing your Finances? N -  Housekeeping or managing your Housekeeping? N -  Some recent data might be hidden    Patient Care Team: Sharion Balloon, FNP as PCP - General (Nurse Practitioner) Wellington Hampshire, MD as PCP - Cardiology (Cardiology) Wellington Hampshire, MD as Consulting Physician (Cardiology) Lavera Guise, [facility] (Pharmacist)  Indicate any recent Medical Services you may have received from other than [facility] providers in the past year (date may be approximate).     Assessment:   This is a routine wellness examination for Leah Ferrell.  Hearing/Vision screen Hearing Screening - Comments:: Denies hearing difficulties  Vision Screening - Comments:: Wears reading glasses prn - up to date with annual eye exams Dr Kalman Shan in [place]  Dietary issues and exercise activities discussed: Current Exercise Habits: Home exercise routine, Type of exercise: treadmill, Time (Minutes): 30, Frequency (Times/Week): 5, Weekly Exercise (Minutes/Week): 150, Intensity: Mild, Exercise limited by: respiratory conditions(s);orthopedic condition(s)   Goals Addressed             This Visit's Progress    DIET - INCREASE WATER INTAKE   On track    Try to drink 6-8 glasses of water daily.       Depression Screen PHQ 2/9 Scores 11/23/2020 11/23/2020 08/23/2020 08/01/2020 05/22/2020 03/20/2020 12/19/2019  PHQ - 2 Score 0 0 0 0 0 0 0  PHQ- 9 Score 0 0 - - - - -    Fall Risk Fall Risk  11/23/2020 08/23/2020 08/01/2020 05/22/2020 03/20/2020  Falls in the past year? 0 0 0 0 0  Number falls in past yr: 0 - - - -  Injury with Fall? 0 - - - -  Comment - - - - -  Risk for fall due to : Impaired balance/gait;Orthopedic patient - - - -  Follow up Education provided;Falls prevention discussed - - - -    FALL RISK PREVENTION PERTAINING  TO THE HOME:  Any stairs in or around the home? No  If so, are there any without handrails? No  Home free of loose throw rugs in walkways, pet beds, electrical cords, etc? Yes  Adequate lighting in your home to reduce risk of falls? Yes   ASSISTIVE DEVICES UTILIZED TO PREVENT FALLS:  Life alert? No  Use of a cane, walker or w/c? No  Grab bars in the bathroom? No  Shower chair or bench in shower? Yes  Elevated toilet seat or a handicapped toilet? Yes   TIMED UP AND GO:  Was the test performed? No . Telephonic visit  Cognitive Function:     6CIT Screen 11/23/2020 10/08/2018  What Year? 0 points 0 points  What month? 0 points 0 points  What time? 0 points 0 points  Count back from 20 0 points 0 points  Months in reverse 0 points 0 points  Repeat phrase 6 points 4 points  Total Score 6 4    Immunizations Immunization History  Administered Date(s) Administered   Hepatitis B, adult 04/11/2017, 05/18/2017   Pneumococcal Conjugate-13 11/06/2016   Pneumococcal Polysaccharide-23 10/17/2015   Tdap 08/19/2011   Zoster,  Live 09/26/2014    TDAP status: Up to date  Flu Vaccine status: Declined, Education has been provided regarding the importance of this vaccine but patient still declined. Advised may receive this vaccine at local pharmacy or Health Dept. Aware to provide a copy of the vaccination record if obtained from local pharmacy or Health Dept. Verbalized acceptance and understanding.  Pneumococcal vaccine status: Up to date  Covid-19 vaccine status: Declined, Education has been provided regarding the importance of this vaccine but patient still declined. Advised may receive this vaccine at local pharmacy or Health Dept.or vaccine clinic. Aware to provide a copy of the vaccination record if obtained from local pharmacy or Health Dept. Verbalized acceptance and understanding.  Qualifies for Shingles Vaccine? Yes   Zostavax completed Yes   Shingrix Completed?: No.    Education  has been provided regarding the importance of this vaccine. Patient has been advised to call insurance company to determine out of pocket expense if they have not yet received this vaccine. Advised may also receive vaccine at local pharmacy or Health Dept. Verbalized acceptance and understanding.  Screening Tests Health Maintenance  Topic Date Due   DEXA SCAN  04/26/2020   MAMMOGRAM  09/13/2020   COVID-19 Vaccine (1) 12/09/2020 (Originally 10/23/1950)   Zoster Vaccines- Shingrix (1 of 2) 02/23/2021 (Originally 04/21/2001)   INFLUENZA VACCINE  05/17/2021 (Originally 09/17/2020)   FOOT EXAM  12/18/2020   Fecal DNA (Cologuard)  02/08/2021   HEMOGLOBIN A1C  05/24/2021   TETANUS/TDAP  08/18/2021   OPHTHALMOLOGY EXAM  09/26/2021   Hepatitis C Screening  Completed   HPV VACCINES  Aged Out    Health Maintenance  Health Maintenance Due  Topic Date Due   DEXA SCAN  04/26/2020   MAMMOGRAM  09/13/2020    Colorectal cancer screening: Type of screening: Cologuard. Completed 01/29/2018. Repeat every 3 years  Mammogram status: Completed 09/14/2019. Repeat every year Has appt 12/25/2019 @ 11  Bone Density status: Completed 04/27/2018. Results reflect: Bone density results: OSTEOPENIA. Repeat every 2 years.  Lung Cancer Screening: (Low Dose CT Chest recommended if Age 17-80 years, 30 pack-year currently smoking OR have quit w/in 15years.) does not qualify.   Additional Screening:  Hepatitis C Screening: does qualify; Completed 11/06/2016  Vision Screening: Recommended annual ophthalmology exams for early detection of glaucoma and other disorders of the eye. Is the patient up to date with their annual eye exam?  Yes  Who is the provider or what is the name of the office in which the patient attends annual eye exams? [facility] [place] If pt is not established with a provider, would they like to be referred to a provider to establish care? No .   Dental Screening: Recommended annual dental exams for  proper oral hygiene  Community Resource Referral / Chronic Care Management: CRR required this visit?  No   CCM required this visit?  No      Plan:     I have personally reviewed and noted the following in the patient's chart:   Medical and social history Use of alcohol, tobacco or illicit drugs  Current medications and supplements including opioid prescriptions.  Functional ability and status Nutritional status Physical activity Advanced directives List of other physicians Hospitalizations, surgeries, and ER visits in previous 12 months Vitals Screenings to include cognitive, depression, and falls Referrals and appointments  In addition, I have reviewed and discussed with patient certain preventive protocols, quality metrics, and best practice recommendations. A written personalized care plan for  preventive services as well as general preventive health recommendations were provided to patient.     Sandrea Hammond, LPN   82/06[birth year]   Nurse Notes: none

## 2020-11-23 NOTE — Patient Instructions (Addendum)
???????? ???????, ??? ?????? Medicare Wellness-? ????????? ?????????. ???? ????? ??????? ???????? ??????? ?????? ??????? ?? ???????? ?????. ?????? ???????? ?????? ??????????? ????? ????? ???????? ???? ?????? ????? ??????? ????? ???????????.  ?????? ???????/ ????????: ???????????: Cologuard 01/29/2018 - 3 ??? ??????? ??????????: 2021 ??? 7-? ????? 28-?? ???????? - ??? ??? ??????? *????????? ??????? 2022 ??? 11/7-?? ?????? 11:00 ???? ???? ???????: 04/27/2018 ?????? - 2 ??? ?????? ??????? ????????? ????????, ?????? ??????? ??? ??? ?????? ???/??????????? ?????? ?????? ?????? ?????. ?????? ????? ????, ?????? ?????? ???? ??? ??? ?????? ????? ?????? ?????? ?????  ?????????????: ????????? ????? ??????: ?????????? ????????????? ????? ??????: 2017.8.30, 2018.09.20 ?????? Tdap ??????: 08/19/2011 ?????? - 10 ?????? ????? ?????? ????? ?????? ?????? ????? ??????: Zostavax 2016 ??? ???????? - Shingrix-??? ?????????. ?????? ???????? ?????????? ?????? ???? ????? ????????? ?????????? ??. ?????-19: ??????????  ???????????? ??????: ????? ??????? ??????? ???????????? ?????????, ??????? ????? ???????? ????????? ????? ???? ??????, ?????? ????????? ??????????? ??? ????? ??.  ???????????? ??????/??????: ???? ??? 30 ????? ?????? ????????? ???? ????? ?????? ??????? ?????, 6-8 ???? ?? ???, ???? ????? ?? ???????.  ????????? ???????: ??? ?????? ????? ??????? ?????? ??? ?????? ????? ???? ???????   ????????? ????????? ????????? 65 ?? ??????? ???? ??????, ??????? ????????? ????????? ???????? ????? ?? ????? ????, ????? ??????? ?????? ????????? ??? ??????? ??????, ????? ??????? ??????? ????????? ?????????????? ?????? ????? ??. ????????? ????????? ??????????? ?? ??????? ??? ? ??? ??? ?????? ?????? ?????. ?????? ??? ?????? ??????? ??????? ??? ????????. ? ???? ??? ???? ???? ???? ?????? ????? ??????. ? ?????? ?????? ?????. ????? ??????? ??????? ????????? ????????????? ????? ????? ???? ?????? ?????? ??????????. ? ??????  ????????? ??? ??????? ??????????, ????: ? ???, ??????? ???? ??? ????????. ? ??????? ?????? ??????? ?????? ????. ? ????? ????????. ? ?????, ???????????? ????? ??????????? ?????????. ? ???????????? ??????? ????????? ??????????. ? ??????? ??????? ????????? ????. ? ???? ??? ???? ??????? ??????? ????. ? ????? ??????? ??????? ????????? ??????????? ?????????? ????? ???????, ???????? ????????? ????. ?????? ??????? ????????? ???? ?? ?????? ??? ??? ?????? ??????? ???????? ???? ????? ??????? ??????? ????????? ??????????? ?????? ??? ?????????, ????? ?? ???? ???, ??????? ????? ????, ????????? ??? ??????? ??????? ????? ?????, ??? ?????? ?????? ???? ??????? ??????????. ???????? ???? ???? ????? ??????? ??????? ????????? ???????? ???? ?????? ?????? ??????????? ????? ?????. ? ???????????? ??????? ????????. ? ??????? ????????. ? ???????????? ??????? ????????. ? ?????? ????????? ???? ?????? ??????. ? ??? ????, ?????????? ???? ?????? ??????. ? ??????? ??? ?????????. ? ???? ??????. ? ??????? ????. ? ?? ???????? ?? ?????? ?????? (????? ???????). ? ????? ????? ????? ?????. ? ????? ???????? ????? ????. ???????? ?? ?????? ??????? ????? ?????????? ???? ?????. ? ?????, ???, BMI. ? ????? ??????. ? ????? ?? ??????????? ??????. ????????? 5 ??? ??????, ????? ?? 50-??? ???? ?????? ??? ???? ???? ???? ?????? ?????. ? ?????? ?????. ? ??????? ???? ??????? ????????. ????? ?? 30 ??????? ????? ??????, ???? ????? ?????? ????? ??????? 15 ?????? ????????? ????????? ?????? ??? 55 ??????? ????? ??? ??? ??? ?????? ????????? ?????. ? ??????? ???? ????? ????????? (FOBT). ?? 50 ??????? ????? ??? ??? ??? ?????????? ??? ?????. ? ??? ????????????? ????? ???????????. ?? 5 ??? ?????? ????????????? ????? 50 ??????? ????? 10 ??? ?????? ?????? ???????? ????????? ????????? ????????? ??????? ?????. ? ??????? ?-??? ????? ?????????. ? ??????? ?-??? ????? ?????????. ? ??????? ?????? ?????? ?????? ?????????. ? ??????? ????????? ?????. ??? ??  ????? ????????? ???? ??????? (????? ??????? ?????) ????? ???? ??????? ??????? (??????) ?????? ?????? ????????. ?? ?????? 1-3 ??? ?????? ???? ?????. ? ???? ?????? ???????. ??? ?? ???? ????????????? ???????? ????????? ????????. ?? ??????  65 ??????? ????? ??????? ?????. ? ??????????. ?????? 1-2 ??? ?????? ???? ?????. ??? ???? ???? ?????????? ??????? ?????? ????? ??????? ??????? ????????? ?????????????? ?????. ????????? ?? ???, ??????????? ???????, ???????????? ??? ?????? ????????? ???? ???????????? ?????? ????? ??????? ??????? ????????? ?????????????? ?????. ??????  ???? ????? ??????? ??????? ????????? ???????? ????? ???????? ????? ?????? ?????, ?????????: ? ????????? ????? ??????. ?????? ??? ??? ?????? ?????. ? ??????, ?????, ?????? ?????? (Tdap, Td) ??????. ???? 10 ??? ?????? Td ???????????? ???????? ???? ????????. ? Zoster ??????. ??? ?? 60 ??????? ???? ???? ???????? ???? ???????? ??. ? ????????????? 13 ???????? ???????? (PCV13) ??????. 65 ??????? ???? ??? ???? ??????????? ?????? ?????. ? ????????????? ??????????? (PPSV23) ??????. 65 ??????? ???? ??? ???? ??????????? ?????? ?????. ????? ??????? ??????? ????????? ?????????????? ???????, ???? ???? ?????, ?????? ????????, ??? ????? ???????????? ??????. ??? ???????? ?? ???? ????? ??????? ??????? ????????? ??????????? ????? ????????? ????? ?????????? ?????. ?? ????? ??????? ??????? ????????? ?????????????? ????? ???????? ?????????. ?????? ??????? ????????: 2015-03-02 ?????? ??????? ??????????: 2015-10-24 ?????? ??????? ???????: 2014-12-05 Else[employer] ?????????? ???????? ?????????  2017 [employer]  ?????? ???????? ????????? ?????????  ????? ?? ?????? ??????? ?????????. ??? ??? ????? ??????? ????????? ?????. ??? ????? ??????? ???????, ???????? ????????? ???????????? ???? ?? ???? ??????? ???? ?????. ?? ????????? ????? ???? ?? ???? ????? ??? ? -??? ???????? ??????? ????????  Ms. Stang , Thank you for taking time to come for your Medicare Wellness  Visit. I appreciate your ongoing commitment to your health goals. Please review the following plan we discussed and let me know if I can assist you in the future.   Screening recommendations/referrals: Colonoscopy: Cologuard 01/29/2018 - Repeat 3 years Mammogram: Done 09/14/2019 - Repeat annually *next appointment 12/24/2020 @ 11:00 am Bone Density: Done 04/27/2018 - Repeat every 2 years  Recommended yearly ophthalmology/optometry visit for glaucoma screening and checkup Recommended yearly dental visit for hygiene and checkup  Vaccinations: Influenza vaccine: Declined Pneumococcal vaccine: Done 10/17/2015 & 11/06/2016 Tdap vaccine: Done 08/19/2011 - Repeat in 10 years Shingles vaccine: Zostavax done 2016 - Shingrix discussed. Please contact your pharmacy for coverage information.     Covid-19: Declined  Advanced directives: Please bring a copy of your health care power of attorney and living will to the office to be added to your chart at your convenience.  Conditions/risks identified: Aim for 30 minutes of exercise or brisk walking each day, drink 6-8 glasses of water and eat lots of fruits and vegetables.   Next appointment: Follow up in one year for your annual wellness visit    Preventive Care 65 Years and Older, Female Preventive care refers to lifestyle choices and visits with your health care provider that can promote health and wellness. What does preventive care include? ? A yearly physical exam. This is also called an annual well check. ? Dental exams once or twice a year. ? Routine eye exams. Ask your health care provider how often you should have your eyes checked. ? Personal lifestyle choices, including:  ? Daily care of your teeth and gums.  ? Regular physical activity.  ? Eating a healthy diet.  ? Avoiding tobacco and drug use.  ? Limiting alcohol use.  ? Practicing safe sex.  ? Taking low-dose aspirin every day.  ? Taking vitamin and mineral supplements as recommended by  your health care provider. What happens during an annual well check? The services and screenings done by your health care provider during your annual well check will depend on your age, overall health, lifestyle risk factors, and family  history of disease. Counseling  Your health care provider may ask you questions about your: ? Alcohol use. ? Tobacco use. ? Drug use. ? Emotional well-being. ? Home and relationship well-being. ? Sexual activity. ? Eating habits. ? History of falls. ? Memory and ability to understand (cognition). ? Work and work Statistician. ? Reproductive health. Screening  You may have the following tests or measurements: ? Height, weight, and BMI. ? Blood pressure. ? Lipid and cholesterol levels. These may be checked every 5 years, or more frequently if you are over 35 years old. ? Skin check. ? Lung cancer screening. You may have this screening every year starting at age 28 if you have a 30-pack-year history of smoking and currently smoke or have quit within the past 15 years. ? Fecal occult blood test (FOBT) of the stool. You may have this test every year starting at age 61. ? Flexible sigmoidoscopy or colonoscopy. You may have a sigmoidoscopy every 5 years or a colonoscopy every 10 years starting at age 31. ? Hepatitis C blood test. ? Hepatitis B blood test. ? Sexually transmitted disease (STD) testing. ? Diabetes screening. This is done by checking your blood sugar (glucose) after you have not eaten for a while (fasting). You may have this done every 1-3 years. ? Bone density scan. This is done to screen for osteoporosis. You may have this done starting at age 44. ? Mammogram. This may be done every 1-2 years. Talk to your health care provider about how often you should have regular mammograms. Talk with your health care provider about your test results, treatment options, and if necessary, the need for more tests. Vaccines   Your health care provider may  recommend certain vaccines, such as: ? Influenza vaccine. This is recommended every year. ? Tetanus, diphtheria, and acellular pertussis (Tdap, Td) vaccine. You may need a Td booster every 10 years. ? Zoster vaccine. You may need this after age 82. ? Pneumococcal 13-valent conjugate (PCV13) vaccine. One dose is recommended after age 71. ? Pneumococcal polysaccharide (PPSV23) vaccine. One dose is recommended after age 43. Talk to your health care provider about which screenings and vaccines you need and how often you need them. This information is not intended to replace advice given to you by your health care provider. Make sure you discuss any questions you have with your health care provider. Document Released: 03/02/2015 Document Revised: 10/24/2015 Document Reviewed: 12/05/2014 Elsevier Interactive Patient Education  2017 [place] Prevention in the Home  Falls can cause injuries. They can happen to people of all ages. There are many things you can do to make your home safe and to help prevent falls. What can I do on the outside of my home? ? Regularly fix the edges of

## 2020-11-23 NOTE — Patient Instructions (Signed)
Diabetes Mellitus and Nutrition, Adult When you have diabetes, or diabetes mellitus, it is very important to have healthy eating habits because your blood sugar (glucose) levels are greatly affected by what you eat and drink. Eating healthy foods in the right amounts, at about the same times every day, can help you:  Control your blood glucose.  Lower your risk of heart disease.  Improve your blood pressure.  Reach or maintain a healthy weight. What can affect my meal plan? Every person with diabetes is different, and each person has different needs for a meal plan. Your health care provider may recommend that you work with a dietitian to make a meal plan that is best for you. Your meal plan may vary depending on factors such as:  The calories you need.  The medicines you take.  Your weight.  Your blood glucose, blood pressure, and cholesterol levels.  Your activity level.  Other health conditions you have, such as heart or kidney disease. How do carbohydrates affect me? Carbohydrates, also called carbs, affect your blood glucose level more than any other type of food. Eating carbs naturally raises the amount of glucose in your blood. Carb counting is a method for keeping track of how many carbs you eat. Counting carbs is important to keep your blood glucose at a healthy level, especially if you use insulin or take certain oral diabetes medicines. It is important to know how many carbs you can safely have in each meal. This is different for every person. Your dietitian can help you calculate how many carbs you should have at each meal and for each snack. How does alcohol affect me? Alcohol can cause a sudden decrease in blood glucose (hypoglycemia), especially if you use insulin or take certain oral diabetes medicines. Hypoglycemia can be a life-threatening condition. Symptoms of hypoglycemia, such as sleepiness, dizziness, and confusion, are similar to symptoms of having too much  alcohol.  Do not drink alcohol if: ? Your health care provider tells you not to drink. ? You are pregnant, may be pregnant, or are planning to become pregnant.  If you drink alcohol: ? Do not drink on an empty stomach. ? Limit how much you use to:  0-1 drink a day for women.  0-2 drinks a day for men. ? Be aware of how much alcohol is in your drink. In the [place] one drink equals one 12 oz bottle of beer (355 mL), one 5 oz glass of wine (148 mL), or one 1 oz glass of hard liquor (44 mL). ? Keep yourself hydrated with water, diet soda, or unsweetened iced tea.  Keep in mind that regular soda, juice, and other mixers may contain a lot of sugar and must be counted as carbs. What are tips for following this plan? Reading food labels  Start by checking the serving size on the "Nutrition Facts" label of packaged foods and drinks. The amount of calories, carbs, fats, and other nutrients listed on the label is based on one serving of the item. Many items contain more than one serving per package.  Check the total grams (g) of carbs in one serving. You can calculate the number of servings of carbs in one serving by dividing the total carbs by 15. For example, if a food has 30 g of total carbs per serving, it would be equal to 2 servings of carbs.  Check the number of grams (g) of saturated fats and trans fats in one serving. Choose foods that have   a low amount or none of these fats.  Check the number of milligrams (mg) of salt (sodium) in one serving. Most people should limit total sodium intake to less than 2,300 mg per day.  Always check the nutrition information of foods labeled as "low-fat" or "nonfat." These foods may be higher in added sugar or refined carbs and should be avoided.  Talk to your dietitian to identify your daily goals for nutrients listed on the label. Shopping  Avoid buying canned, pre-made, or processed foods. These foods tend to be high in fat, sodium, and added  sugar.  Shop around the outside edge of the grocery store. This is where you will most often find fresh fruits and vegetables, bulk grains, fresh meats, and fresh dairy. Cooking  Use low-heat cooking methods, such as baking, instead of high-heat cooking methods like deep frying.  Cook using healthy oils, such as olive, canola, or sunflower oil.  Avoid cooking with butter, cream, or high-fat meats. Meal planning  Eat meals and snacks regularly, preferably at the same times every day. Avoid going long periods of time without eating.  Eat foods that are high in fiber, such as fresh fruits, vegetables, beans, and whole grains. Talk with your dietitian about how many servings of carbs you can eat at each meal.  Eat 4-6 oz (112-168 g) of lean protein each day, such as lean meat, chicken, fish, eggs, or tofu. One ounce (oz) of lean protein is equal to: ? 1 oz (28 g) of meat, chicken, or fish. ? 1 egg. ?  cup (62 g) of tofu.  Eat some foods each day that contain healthy fats, such as avocado, nuts, seeds, and fish.   What foods should I eat? Fruits Berries. Apples. Oranges. Peaches. Apricots. Plums. Grapes. Mango. Papaya. Pomegranate. Kiwi. Cherries. Vegetables Lettuce. Spinach. Leafy greens, including kale, chard, collard greens, and mustard greens. Beets. Cauliflower. Cabbage. Broccoli. Carrots. Green beans. Tomatoes. Peppers. Onions. Cucumbers. Brussels sprouts. Grains Whole grains, such as whole-wheat or whole-grain bread, crackers, tortillas, cereal, and pasta. Unsweetened oatmeal. Quinoa. Brown or wild rice. Meats and other proteins Seafood. Poultry without skin. Lean cuts of poultry and beef. Tofu. Nuts. Seeds. Dairy Low-fat or fat-free dairy products such as milk, yogurt, and cheese. The items listed above may not be a complete list of foods and beverages you can eat. Contact a dietitian for more information. What foods should I avoid? Fruits Fruits canned with  syrup. Vegetables Canned vegetables. Frozen vegetables with butter or cream sauce. Grains Refined white flour and flour products such as bread, pasta, snack foods, and cereals. Avoid all processed foods. Meats and other proteins Fatty cuts of meat. Poultry with skin. Breaded or fried meats. Processed meat. Avoid saturated fats. Dairy Full-fat yogurt, cheese, or milk. Beverages Sweetened drinks, such as soda or iced tea. The items listed above may not be a complete list of foods and beverages you should avoid. Contact a dietitian for more information. Questions to ask a health care provider  Do I need to meet with a diabetes educator?  Do I need to meet with a dietitian?  What number can I call if I have questions?  When are the best times to check my blood glucose? Where to find more information:  American Diabetes Association: diabetes.org  Academy of Nutrition and Dietetics: www.eatright.org  National Institute of Diabetes and Digestive and Kidney Diseases: www.niddk.nih.gov  Association of Diabetes Care and Education Specialists: www.diabeteseducator.org Summary  It is important to have healthy eating   habits because your blood sugar (glucose) levels are greatly affected by what you eat and drink.  A healthy meal plan will help you control your blood glucose and maintain a healthy lifestyle.  Your health care provider may recommend that you work with a dietitian to make a meal plan that is best for you.  Keep in mind that carbohydrates (carbs) and alcohol have immediate effects on your blood glucose levels. It is important to count carbs and to use alcohol carefully. This information is not intended to replace advice given to you by your health care provider. Make sure you discuss any questions you have with your health care provider. Document Revised: 01/11/2019 Document Reviewed: 01/11/2019 Elsevier Patient Education  2021 Elsevier Inc.  

## 2020-11-24 LAB — CBC WITH DIFFERENTIAL/PLATELET
Basophils Absolute: 0.1 10*3/uL (ref 0.0–0.2)
Basos: 1 %
EOS (ABSOLUTE): 0.2 10*3/uL (ref 0.0–0.4)
Eos: 2 %
Hematocrit: 44.5 % (ref 34.0–46.6)
Hemoglobin: 14.1 g/dL (ref 11.1–15.9)
Immature Grans (Abs): 0 10*3/uL (ref 0.0–0.1)
Immature Granulocytes: 0 %
Lymphocytes Absolute: 3 10*3/uL (ref 0.7–3.1)
Lymphs: 30 %
MCH: 29.3 pg (ref 26.6–33.0)
MCHC: 31.7 g/dL (ref 31.5–35.7)
MCV: 92 fL (ref 79–97)
Monocytes Absolute: 0.7 10*3/uL (ref 0.1–0.9)
Monocytes: 7 %
Neutrophils Absolute: 6.1 10*3/uL (ref 1.4–7.0)
Neutrophils: 60 %
Platelets: 253 10*3/uL (ref 150–450)
RBC: 4.82 x10E6/uL (ref 3.77–5.28)
RDW: 15.2 % (ref 11.7–15.4)
WBC: 10 10*3/uL (ref 3.4–10.8)

## 2020-11-24 LAB — CMP14+EGFR
ALT: 18 IU/L (ref 0–32)
AST: 18 IU/L (ref 0–40)
Albumin/Globulin Ratio: 1.3 (ref 1.2–2.2)
Albumin: 4.3 g/dL (ref 3.8–4.8)
Alkaline Phosphatase: 76 IU/L (ref 44–121)
BUN/Creatinine Ratio: 26 (ref 12–28)
BUN: 18 mg/dL (ref 8–27)
Bilirubin Total: 0.3 mg/dL (ref 0.0–1.2)
CO2: 26 mmol/L (ref 20–29)
Calcium: 10.6 mg/dL — ABNORMAL HIGH (ref 8.7–10.3)
Chloride: 98 mmol/L (ref 96–106)
Creatinine, Ser: 0.7 mg/dL (ref 0.57–1.00)
Globulin, Total: 3.3 g/dL (ref 1.5–4.5)
Glucose: 190 mg/dL — ABNORMAL HIGH (ref 70–99)
Potassium: 5.5 mmol/L — ABNORMAL HIGH (ref 3.5–5.2)
Sodium: 140 mmol/L (ref 134–144)
Total Protein: 7.6 g/dL (ref 6.0–8.5)
eGFR: 94 mL/min/{1.73_m2} (ref >59)

## 2020-11-24 LAB — LIPID PANEL
Chol/HDL Ratio: 2.5 ratio (ref 0.0–4.4)
Cholesterol, Total: 188 mg/dL (ref 100–199)
HDL: 75 mg/dL (ref >39)
LDL Chol Calc (NIH): 91 mg/dL (ref 0–99)
Triglycerides: 128 mg/dL (ref 0–149)
VLDL Cholesterol Cal: 22 mg/dL (ref 5–40)

## 2020-11-27 ENCOUNTER — Ambulatory Visit: Payer: Medicare Other | Admitting: Cardiovascular Disease

## 2020-11-27 ENCOUNTER — Other Ambulatory Visit: Payer: Self-pay

## 2020-11-27 ENCOUNTER — Other Ambulatory Visit: Payer: Medicare Other

## 2020-11-27 ENCOUNTER — Telehealth: Payer: Self-pay | Admitting: Cardiovascular Disease

## 2020-11-27 DIAGNOSIS — E875 Hyperkalemia: Secondary | ICD-10-CM

## 2020-11-27 NOTE — Telephone Encounter (Signed)
Left a message for the patient to call back. She can be double booked at 9 am with Dr. Fletcher [name] on 10/25.

## 2020-11-27 NOTE — Telephone Encounter (Signed)
Patient had an appt with Dr. Fletcher [name] today but something came up and she had to reschedule. I've rescheduled her for 01/08/21 at 9am. Patient states that she can only come at 9am but would really like to be seen sooner. She wanted to know if she could see him on 12/11/20 but I stated he was already full that day. Not sure if you would like to squeeze her in on 12/11/20. She is being seen for increased swelling.

## 2020-11-27 NOTE — Progress Notes (Deleted)
Cardiology Office Note   Date:  11/27/2020   ID:  Leah Ferrell, DOB 10/11/1950, MRN [id number]  PCP:  Sharion Balloon, FNP  Cardiologist: Dr. Fletcher [name]   No chief complaint on file.     History of Present Illness: Leah Ferrell is a 70 y.o. female who presents for a followup visit regarding peripheral arterial disease and coronary artery disease.  She has known history of coronary artery disease status post drug-eluting stent placement to the LAD X 2 Most recently in 2015 for in-stent restenosis.  She is known to have peripheral arterial disease status post left common iliac artery stent placement followed by left common femoral artery resection and bypass from left external iliac to the left profunda done by Dr. Kellie Simmering in 2015. She is known to have chronically occluded bilateral SFAs being managed medically.  She had worsening left leg claudication in 2018 with elevated velocity at the proximal anastomosis of the graft.   Angiography in March 2018 showed mild to moderate calcified common iliac artery disease with patent stent in the left common iliac artery. There was severe stenosis in the left common femoral artery at the proximal anastomosis of the bypass to the profunda. I performed successful angioplasty and drug-coated balloon angioplasty.   She is status post bilateral renal artery stenting in January 2021 due to refractory hypertension and recurrent heart failure.  She had significant improvement in blood pressure control as well as heart failure symptoms since then.    She was seen in March of this year for worsening right calf claudication as well her a small nonhealing ulceration on the lateral side of the small toe.  Doppler studies showed moderately reduced ABI bilaterally.  Toe pressure on the right side was not detectable.   In addition, she reported worsening anginal chest pain especially while working out on the treadmill.  Lexiscan Myoview showed evidence of prior  anterolateral, lateral and inferolateral infarct with large peri-infarct ischemia.  Overall high risk stress test. I proceeded with cardiac catheterization as well as lower extremity angiography in March.  Cardiac catheterization showed heavily calcified coronary arteries with significant three-vessel coronary artery disease with patent stents in the LAD but significant restenosis in the proximal stent, severe ostial stenosis in a large second diagonal and severe ostial stenosis in the left circumflex and moderately diffuse disease in the right coronary artery. Lower extremity angiography showed patent renal artery stents with significant restenosis on the right side, severe bilateral heavily calcified ostial common iliac artery disease extending into the distal aorta.  The iliac arteries were treated by successful kissing stent placement.  On the right side, there was moderate common femoral artery stenosis as well as chronically occluded SFA with reconstitution distally via collaterals from the profunda, short occlusion of above-the-knee popliteal artery with collaterals and three-vessel runoff below the knee.  On the left side, there was flush occlusion of the SFA with collaterals from the profunda and two-vessel runoff below the knee.  She underwent CABG in May with LIMA to LAD, SVG to OM and SVG to diagonal.  She had sternal wound infection that improved with antibiotics.  She has been doing well with no anginal symptoms.  No significant leg claudication but she does have some swelling in her right leg where the vein was harvested.  In addition, she has some discomfort in the toes.  Past Medical History:  Diagnosis Date   CAD (coronary artery disease)    a. 04/2012 NSTEMI: s/p  DES to LAD.   Chronic diastolic CHF (congestive heart failure) (HCC)    Diabetes mellitus without complication (HCC)    Dysrhythmia    Environmental and seasonal allergies    Hyperlipidemia    Hypertension    Leukocytosis     Morbid obesity (HCC)    NSTEMI (non-ST elevated myocardial infarction) ([place]) 04/27/2012   PONV (postoperative nausea and vomiting)    PVD (peripheral vascular disease) ([place])    a. 04/2012 ABI: R 0.49, L 0.39. Angiography 06/14: Significant ostial left common iliac artery stenosis extending into the distal aorta, severe left common femoral artery stenosis, bilateral SFA occlusion with heavy calcifications. Functionally, one-vessel runoff bilaterally below the knee   Renal artery stenosis (HCC)    s/p angioplasty/stenting bilateral renal arteries 03/16/19   Shortness of breath     Past Surgical History:  Procedure Laterality Date   ABDOMINAL AORTAGRAM N/A 07/21/2012   Procedure: ABDOMINAL Maxcine Ham;  Surgeon: Wellington Hampshire, MD;  Location: [facility]ATH [facility];  Service: Cardiovascular;  Laterality: N/A;   ABDOMINAL AORTOGRAM W/LOWER EXTREMITY N/A 05/09/2020   Procedure: ABDOMINAL AORTOGRAM W/LOWER EXTREMITY;  Surgeon: Wellington Hampshire, MD;  Location: [facility] CV LAB;  Service: Cardiovascular;  Laterality: N/A;   CARDIAC CATHETERIZATION     stents X 2   CORONARY ANGIOGRAM  04/27/2012   Procedure: CORONARY ANGIOGRAM;  Surgeon: Thayer Headings, MD;  Location: [facility] CATH LAB;  Service: Cardiovascular;;   CORONARY ARTERY BYPASS GRAFT N/A 06/25/2020   Procedure: CORONARY ARTERY BYPASS GRAFTING (CABG)X3. LEFT INTERNAL MAMMARY ARTERY. RIGHT ENDOSCOPIC SAPHENOUS VEIN HARVESTING.;  Surgeon: Wonda Olds, MD;  Location: [facility];  Service: Open Heart Surgery;  Laterality: N/A;   ENDARTERECTOMY FEMORAL Left 11/02/2013   Procedure: RESECTION LEFT COMMON FEMORAL ARTERY AND INSERTION OF INTERPOSITION 55m HEMASHIELD GRAFT FROM LEFT EXTERNAL  ILIAC ARTERY TO LEFT PROFUNDA  FEMORIS ARTERY;  Surgeon: JMal Misty MD;  [facility]  Service: Vascular;  Laterality: Left;   EYE SURGERY Right 2017   FRACTURE SURGERY Right Jul 07, 2014   Right [place]-  [place]  [facility]  by Heat duct   ILIAC ARTERY STENT Left 08/31/2013    LAD stent     LEFT HEART CATH AND CORONARY ANGIOGRAPHY N/A 05/09/2020   Procedure: LEFT HEART CATH AND CORONARY ANGIOGRAPHY;  Surgeon: AWellington Hampshire MD;  [facility];  Service: Cardiovascular;  Laterality: N/A;   LEFT HEART CATHETERIZATION WITH CORONARY ANGIOGRAM N/A 04/23/2012   Procedure: LEFT HEART CATHETERIZATION WITH CORONARY ANGIOGRAM;  Surgeon: Peter M JMartinique MD;  [facility];  Service: Cardiovascular;  Laterality: N/A;   LOWER EXTREMITY ANGIOGRAM Left 08/31/2013   Procedure: LOWER EXTREMITY ANGIOGRAM;  Surgeon: MWellington Hampshire MD;  [facility];  Service: Cardiovascular;  Laterality: Left;   PERCUTANEOUS STENT INTERVENTION Left 08/31/2013   Procedure: PERCUTANEOUS STENT INTERVENTION;  Surgeon: MWellington Hampshire MD;  [facility];  Service: Cardiovascular;  Laterality: Left;  Common Iliac artery   PERIPHERAL VASCULAR CATHETERIZATION N/A 03/19/2016   Procedure: Abdominal Aortogram w/Lower Extremity;  Surgeon: MWellington Hampshire MD;  [facility];  Service: Cardiovascular;  Laterality: N/A;   PERIPHERAL VASCULAR CATHETERIZATION  03/19/2016   Procedure: Peripheral Vascular Balloon Angioplasty-CFA Left;  Surgeon: MWellington Hampshire MD;  [facility];  Service: Cardiovascular;;   PERIPHERAL VASCULAR INTERVENTION Bilateral 03/16/2019   Procedure: PERIPHERAL VASCULAR INTERVENTION;  Surgeon: AWellington Hampshire MD;  [facility];  Service: Cardiovascular;  Laterality: Bilateral;  RENAL   PERIPHERAL VASCULAR INTERVENTION Bilateral 05/09/2020   Procedure: PERIPHERAL VASCULAR INTERVENTION;  Surgeon: Wellington Hampshire, MD;  Location: [facility] CV LAB;  Service: Cardiovascular;  Laterality: Bilateral;  Iliac   RENAL ANGIOGRAPHY  03/16/2019   RENAL ANGIOGRAPHY Bilateral 03/16/2019   Procedure: RENAL ANGIOGRAPHY;  Surgeon: Wellington Hampshire, MD;  Location: [facility] CV LAB;  Service: Cardiovascular;  Laterality:  Bilateral;   TEE WITHOUT CARDIOVERSION N/A 06/25/2020   Procedure: TRANSESOPHAGEAL ECHOCARDIOGRAM (TEE);  Surgeon: Wonda Olds, MD;  Location: [facility];  Service: Open Heart Surgery;  Laterality: N/A;   TUMOR REMOVAL     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 20 tablet 0   albuterol (PROVENTIL HFA) 108 (90 Base) MCG/ACT inhaler Inhale 1-2 puffs into the lungs every 6 (six) hours as needed for wheezing or shortness of breath.     amLODipine (NORVASC) 5 MG tablet Take 1 tablet (5 mg total) by mouth daily. 90 tablet 2   Ascorbic Acid (VITAMIN C) 1000 MG tablet Take 1,000 mg by mouth 3 (three) times daily.      aspirin EC 81 MG tablet Take 81 mg by mouth daily. In the morning     BD ULTRA-FINE PEN NEEDLES 29G X 12.7MM MISC USE PENNEEDLES 3 TIMES  DAILY AS DIRECTED 270 each 1   blood glucose meter kit and supplies Dispense based on patient and insurance preference. Use up to four times daily as directed. (FOR ICD-10 E10.9, E11.9). 1 each 0   Calcium Carbonate-Vit D-Min (CALTRATE 600+D PLUS MINERALS) 600-800 MG-UNIT TABS Take 1 tablet by mouth 3 (three) times daily.      carvedilol (COREG) 6.25 MG tablet Take 1 tablet by mouth twice daily (Patient taking differently: Take 6.25 mg by mouth 2 (two) times daily with a meal.) 60 tablet 3   Cholecalciferol (VITAMIN D-3) 125 MCG (5000 UT) TABS Take 5,000 Units by mouth daily.     clopidogrel (PLAVIX) 75 MG tablet Take 1 tablet (75 mg total) by mouth daily. 90 tablet 1   fish oil-omega-3 fatty acids 1000 MG capsule Take 1 g by mouth daily.      glucose blood (GLUCOSE METER TEST) test strip Use 6-10 times a day, Uses ReliOn 900 each 12   halobetasol (ULTRAVATE) 0.05 % cream Apply 1 application topically daily as needed (For rash).     HUMALOG KWIKPEN 200 UNIT/ML  KwikPen Sliding Scales     hydroxypropyl methylcellulose (ISOPTO TEARS) 2.5 % ophthalmic solution Place 1 drop into both eyes 3 (three) times daily as needed (for dry eyes).     Insulin Syringe 27G X 1/2" 0.5 ML MISC Use with Fiasp insulin TID 100 each 3   lisinopril (ZESTRIL) 20 MG tablet Take 1 tablet by mouth twice daily 180 tablet 0   montelukast (SINGULAIR) 10 MG tablet Take 1 tablet by mouth once daily with breakfast 90 tablet 2   ondansetron (ZOFRAN ODT) 4 MG disintegrating tablet Take 1 tablet (4 mg total) by mouth every 8 (eight) hours as needed for nausea or vomiting. 20 tablet 0   RELION PEN NEEDLES 29G X 12MM MISC USE 1 NEEDLE TO INJECT INSULIN SUBCUTANEOUSLY 3 TIMES DAILY (Patient taking differently: 1 Device  by Subconjunctival route 3 (three) times daily.) 50 each 5   rosuvastatin (CRESTOR) 40 MG tablet Take 1 tablet (40 mg total) by mouth daily. 90 tablet 3   Semaglutide,0.25 or 0.5MG/DOS, (OZEMPIC, 0.25 OR 0.5 MG/DOSE,) 2 MG/1.5ML SOPN Inject 1 mg into the skin once a week. Friday 3 mL 1   No current facility-administered medications for this visit.    Allergies:   Tape    Social History:  The patient  reports that she quit smoking about 19 years ago. Her smoking use included cigarettes. She started smoking about 56 years ago. She has a 36.00 pack-year smoking history. She has never used smokeless tobacco. She reports that she does not drink alcohol and does not use drugs.   Family History:  The patient's family history includes Alcohol abuse in her brother; Diabetes in her brother, daughter, maternal grandmother, mother, and son; Heart attack in her maternal grandfather; Heart disease in her brother; Hyperlipidemia in her mother and son; Hypertension in her daughter, mother, and son; Other in her sister and sister; Peripheral vascular disease in her mother.    ROS:  Please see the history of present illness.   Otherwise, review of systems are positive for none.   All other  systems are reviewed and negative.    PHYSICAL EXAM: VS:  There were no vitals taken for this visit. , BMI There is no height or weight on file to calculate BMI. GEN: Well nourished, well developed, in no acute distress  HEENT: normal  Neck: no JVD, carotid bruits, or masses Cardiac: RRR; no  rubs, or gallops , trace edema . There is 2/6 systolic ejection murmur in the aortic area.  Sternal wound appears well-healed at the present time. Respiratory:  clear to auscultation bilaterally, normal work of breathing GI: soft, nontender, nondistended, + BS MS: no deformity or atrophy  Skin: warm and dry, no rash Neuro:  Strength and sensation are intact Psych: euthymic mood, full affect Distal pulses are not palpable.    EKG:  EKG is  not ordered today.    Recent Labs: 06/26/2020: Magnesium 2.2 11/23/2020: ALT 18; BUN 18; Creatinine, Ser 0.70; Hemoglobin 14.1; Platelets 253; Potassium 5.5; Sodium 140    Lipid Panel    Component Value Date/Time   CHOL 188 11/23/2020 1130   TRIG 128 11/23/2020 1130   TRIG 135 09/16/2012 1322   HDL 75 11/23/2020 1130   HDL 60 09/16/2012 1322   CHOLHDL 2.5 11/23/2020 1130   CHOLHDL 3.2 05/06/2012 1713   VLDL 32 05/06/2012 1713   LDLCALC 91 11/23/2020 1130   LDLCALC 92 09/16/2012 1322      Wt Readings from Last 3 Encounters:  11/23/20 202 lb (91.6 kg)  11/23/20 202 lb (91.6 kg)  09/25/20 202 lb 9.6 oz (91.9 kg)       No flowsheet data found.    ASSESSMENT AND PLAN:  1. Peripheral arterial disease: Status post  kissing stent placement to the common iliac arteries due to severe right calf claudication as well as nonhealing ulceration on the right small toe.  No recurrent ulceration.  Repeat ABI and aortoiliac duplex in October.  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  Blood pressure is reasonably controlled.  Most recent angiography did show significant restenosis on the right side but no plan for  revascularization at this time given that left renal artery stent looked reasonable and blood pressure is reasonably controlled.  We can consider repeat renal artery duplex next  year.  3. Coronary artery disease involving native coronary arteries with progressive angina: Status post CABG and seems to be doing reasonably well.  Sternal wound infection seems to have healed.      4. Hyperlipidemia: Continue high-dose rosuvastatin with a target LDL of less than 70.  Most recent lipid profile showed an LDL of 66.  5.  Chronic diastolic heart failure: was in the setting of uncontrolled hypertension and severe bilateral renal artery stenosis.  Currently not requiring a loop diuretic since she had her renal artery stents.   Disposition:   FU with me in 4  month  Signed,  [name], MD  11/27/2020 8:33 AM    [place]

## 2020-11-28 LAB — BMP8+EGFR
BUN/Creatinine Ratio: 14 (ref 12–28)
BUN: 11 mg/dL (ref 8–27)
CO2: 29 mmol/L (ref 20–29)
Calcium: 10.4 mg/dL — ABNORMAL HIGH (ref 8.7–10.3)
Chloride: 97 mmol/L (ref 96–106)
Creatinine, Ser: 0.76 mg/dL (ref 0.57–1.00)
Glucose: 170 mg/dL — ABNORMAL HIGH (ref 70–99)
Potassium: 5.1 mmol/L (ref 3.5–5.2)
Sodium: 141 mmol/L (ref 134–144)
eGFR: 85 mL/min/{1.73_m2} (ref >59)

## 2020-12-03 NOTE — Telephone Encounter (Signed)
Spoke with the patient. She stated that would keep the current appointment that she had with Dr. Fletcher [name] in November.

## 2020-12-11 ENCOUNTER — Ambulatory Visit: Payer: Medicare Other | Admitting: Cardiovascular Disease

## 2020-12-24 ENCOUNTER — Other Ambulatory Visit: Payer: Self-pay

## 2020-12-24 ENCOUNTER — Telehealth: Payer: Self-pay | Admitting: Family

## 2020-12-24 ENCOUNTER — Ambulatory Visit
Admission: RE | Admit: 2020-12-24 | Discharge: 2020-12-24 | Disposition: A | Discharge status: Home / Self Care | Payer: Medicare Other | Source: Ambulatory Visit | Attending: Family | Admitting: Family

## 2020-12-24 DIAGNOSIS — Z1231 Encounter for screening mammogram for malignant neoplasm of breast: Secondary | ICD-10-CM | POA: Diagnosis not present

## 2020-12-25 NOTE — Telephone Encounter (Signed)
You can grab her a sample--she needs to see me for re-enrollment in the patient assistance program  I'm routing to [employer] who will assist in scheduling her for re-enrollment  Amber: Please have patient bring in social security benefits letter or something proving income (most recent for this year)  Thank you both!!

## 2020-12-25 NOTE — Telephone Encounter (Signed)
Left detailed message for patient to pick up samples

## 2020-12-27 NOTE — Telephone Encounter (Signed)
Pt already had appt scheduled 01/01/2021. Spoke with her to make her aware to bring info

## 2020-12-31 ENCOUNTER — Other Ambulatory Visit: Payer: Self-pay | Admitting: Family

## 2020-12-31 DIAGNOSIS — I152 Hypertension secondary to endocrine disorders: Secondary | ICD-10-CM

## 2020-12-31 DIAGNOSIS — E1159 Type 2 diabetes mellitus with other circulatory complications: Secondary | ICD-10-CM

## 2021-01-01 ENCOUNTER — Telehealth: Payer: Self-pay

## 2021-01-01 ENCOUNTER — Ambulatory Visit: Payer: Medicare Other

## 2021-01-01 NOTE — Chronic Care Management (AMB) (Signed)
  Care Management   Note  01/01/2021 Name: Leah Ferrell MRN: [id number] DOB: [birth year]  Leah Ferrell is a 70 y.o. year old female who is a primary care patient of Sharion Balloon, FNP and is actively engaged with the care management team. I reached out to Leah Ferrell by phone today to assist with re-scheduling a follow up visit with the Pharmacist  Follow up plan: Unsuccessful telephone outreach attempt made. A HIPAA compliant phone message was left for the patient providing contact information and requesting a return call.  The care management team will reach out to the patient again over the next 1 days.  If patient returns call to provider office, please advise to call [facility]  at [place], [place], [place], [place] [address] Direct Dial: [phone] [email or web address] Website: [email or web address]

## 2021-01-07 ENCOUNTER — Other Ambulatory Visit: Payer: Self-pay | Admitting: Family

## 2021-01-08 ENCOUNTER — Encounter: Payer: Self-pay | Admitting: Cardiovascular Disease

## 2021-01-08 ENCOUNTER — Ambulatory Visit: Payer: Medicare Other | Admitting: Cardiovascular Disease

## 2021-01-08 ENCOUNTER — Other Ambulatory Visit: Payer: Self-pay

## 2021-01-08 VITALS — BP 178/64 | HR 66 | Ht 62.0 in | Wt 206.8 lb

## 2021-01-08 DIAGNOSIS — I150 Renovascular hypertension: Secondary | ICD-10-CM | POA: Diagnosis not present

## 2021-01-08 DIAGNOSIS — I25118 Atherosclerotic heart disease of native coronary artery with other forms of angina pectoris: Secondary | ICD-10-CM | POA: Diagnosis not present

## 2021-01-08 DIAGNOSIS — E785 Hyperlipidemia, unspecified: Secondary | ICD-10-CM | POA: Diagnosis not present

## 2021-01-08 DIAGNOSIS — I739 Peripheral vascular disease, unspecified: Secondary | ICD-10-CM | POA: Diagnosis not present

## 2021-01-08 NOTE — Telephone Encounter (Signed)
Just wanted to make sure this is the appropriate dose on the sig  Our current med list just says sliding scales  Please verify

## 2021-01-08 NOTE — Patient Instructions (Signed)
Medication Instructions:  Your physician recommends that you continue on your current medications as directed. Please refer to the Current Medication list given to you today.  *If you need a refill on your cardiac medications before your next appointment, please call your pharmacy*   Testing/Procedures: Your physician has requested that you have a renal artery duplex. During this test, an ultrasound is used to evaluate blood flow to the kidneys. Allow one hour for this exam. Do not eat after midnight the day before and avoid carbonated beverages. Take your medications as you usually do. This procedure is done at [facility].   Follow-Up: At [facility], you and your health needs are our priority.  As part of our continuing mission to provide you with exceptional heart care, we have created designated Provider Care Teams.  These Care Teams include your primary Cardiologist (physician) and Advanced Practice Providers (APPs -  Physician Assistants and Nurse Practitioners) who all work together to provide you with the care you need, when you need it.  We recommend signing up for the patient portal called "MyChart".  Sign up information is provided on this After Visit Summary.  MyChart is used to connect with patients for Virtual Visits (Telemedicine).  Patients are able to view lab/test results, encounter notes, upcoming appointments, etc.  Non-urgent messages can be sent to your provider as well.   To learn more about what you can do with MyChart, go to [email or web address].    Your next appointment:   6 month(s)  The format for your next appointment:   In Person  Provider:   [name], MD

## 2021-01-08 NOTE — Progress Notes (Signed)
Cardiology Office Note   Date:  01/09/2021   ID:  Amazing, Cowman 05/18/1950, MRN [id number]  PCP:  Sharion Balloon, FNP  Cardiologist: Dr. Fletcher [name]   No chief complaint on file.     History of Present Illness: Leah Ferrell is a 70 y.o. female who presents for a followup visit regarding peripheral arterial disease and coronary artery disease.  She has known history of coronary artery disease status post drug-eluting stent placement to the LAD X 2 with subsequent CABG in May 2022. She is known to have peripheral arterial disease status post left common iliac artery stent placement followed by left common femoral artery resection and bypass from left external iliac to the left profunda done by Dr. Kellie Simmering in 2015. She is known to have chronically occluded bilateral SFAs being managed medically.  She had worsening left leg claudication in 2018 with elevated velocity at the proximal anastomosis of the graft.   Angiography in March 2018 showed mild to moderate calcified common iliac artery disease with patent stent in the left common iliac artery. There was severe stenosis in the left common femoral artery at the proximal anastomosis of the bypass to the profunda. I performed successful angioplasty and drug-coated balloon angioplasty.   She is status post bilateral renal artery stenting in January 2021 due to refractory hypertension and recurrent heart failure.  She had significant improvement in blood pressure control as well as heart failure symptoms since then.    Lower extremity angiography in March of this year showed patent renal artery stents with significant restenosis on the right side, severe bilateral heavily calcified ostial common iliac artery disease extending into the distal aorta.  The iliac arteries were treated by successful kissing stent placement.  On the right side, there was moderate common femoral artery stenosis as well as chronically occluded SFA with  reconstitution distally via collaterals from the profunda, short occlusion of above-the-knee popliteal artery with collaterals and three-vessel runoff below the knee.  On the left side, there was flush occlusion of the SFA with collaterals from the profunda and two-vessel runoff below the knee. She underwent CABG in May with LIMA to LAD, SVG to OM and SVG to diagonal.  She has been doing reasonably well overall with no recurrent angina or shortness of breath.  She reports no significant leg claudication.  She takes her medications regularly.  She did have some increased swelling in her lower extremities but symptoms overall improved.  The swelling is typically worse at the end of the day.  Past Medical History:  Diagnosis Date   CAD (coronary artery disease)    a. 04/2012 NSTEMI: s/p DES to LAD.   Chronic diastolic CHF (congestive heart failure) (HCC)    Diabetes mellitus without complication (HCC)    Dysrhythmia    Environmental and seasonal allergies    Hyperlipidemia    Hypertension    Leukocytosis    Morbid obesity (HCC)    NSTEMI (non-ST elevated myocardial infarction) ([place]) 04/27/2012   PONV (postoperative nausea and vomiting)    PVD (peripheral vascular disease) ([place])    a. 04/2012 ABI: R 0.49, L 0.39. Angiography 06/14: Significant ostial left common iliac artery stenosis extending into the distal aorta, severe left common femoral artery stenosis, bilateral SFA occlusion with heavy calcifications. Functionally, one-vessel runoff bilaterally below the knee   Renal artery stenosis (HCC)    s/p angioplasty/stenting bilateral renal arteries 03/16/19   Shortness of breath     Past  Surgical History:  Procedure Laterality Date   ABDOMINAL AORTAGRAM N/A 07/21/2012   Procedure: ABDOMINAL Maxcine Ham;  Surgeon: Wellington Hampshire, MD;  Location: [facility] CATH LAB;  Service: Cardiovascular;  Laterality: N/A;   ABDOMINAL AORTOGRAM W/LOWER EXTREMITY N/A 05/09/2020   Procedure: ABDOMINAL AORTOGRAM W/LOWER  EXTREMITY;  Surgeon: Wellington Hampshire, MD;  Location: [facility] CV LAB;  Service: Cardiovascular;  Laterality: N/A;   CARDIAC CATHETERIZATION     stents X 2   CORONARY ANGIOGRAM  04/27/2012   Procedure: CORONARY ANGIOGRAM;  Surgeon: Thayer Headings, MD;  Location: [facility] CATH LAB;  Service: Cardiovascular;;   CORONARY ARTERY BYPASS GRAFT N/A 06/25/2020   Procedure: CORONARY ARTERY BYPASS GRAFTING (CABG)X3. LEFT INTERNAL MAMMARY ARTERY. RIGHT ENDOSCOPIC SAPHENOUS VEIN HARVESTING.;  Surgeon: Wonda Olds, MD;  Location: [facility];  Service: Open Heart Surgery;  Laterality: N/A;   ENDARTERECTOMY FEMORAL Left 11/02/2013   Procedure: RESECTION LEFT COMMON FEMORAL ARTERY AND INSERTION OF INTERPOSITION 3m HEMASHIELD GRAFT FROM LEFT EXTERNAL  ILIAC ARTERY TO LEFT PROFUNDA  FEMORIS ARTERY;  Surgeon: JMal Misty MD;  [place] [facility]  Service: Vascular;  Laterality: Left;   EYE SURGERY Right 2017   FRACTURE SURGERY Right Jul 07, 2014   Right Foot-  [place]  [place] Home  by Heat duct   ILIAC ARTERY STENT Left 08/31/2013   LAD stent     LEFT HEART CATH AND CORONARY ANGIOGRAPHY N/A 05/09/2020   Procedure: LEFT HEART CATH AND CORONARY ANGIOGRAPHY;  Surgeon: AWellington Hampshire MD;  [facility];  Service: Cardiovascular;  Laterality: N/A;   LEFT HEART CATHETERIZATION WITH CORONARY ANGIOGRAM N/A 04/23/2012   Procedure: LEFT HEART CATHETERIZATION WITH CORONARY ANGIOGRAM;  Surgeon: Peter M JMartinique MD;  [facility];  Service: Cardiovascular;  Laterality: N/A;   LOWER EXTREMITY ANGIOGRAM Left 08/31/2013   Procedure: LOWER EXTREMITY ANGIOGRAM;  Surgeon: MWellington Hampshire MD;  [facility];  Service: Cardiovascular;  Laterality: Left;   PERCUTANEOUS STENT INTERVENTION Left 08/31/2013   Procedure: PERCUTANEOUS STENT INTERVENTION;  Surgeon: MWellington Hampshire MD;  [facility];  Service: Cardiovascular;  Laterality: Left;  Common Iliac artery   PERIPHERAL VASCULAR CATHETERIZATION N/A  03/19/2016   Procedure: Abdominal Aortogram w/Lower Extremity;  Surgeon: MWellington Hampshire MD;  [facility];  Service: Cardiovascular;  Laterality: N/A;   PERIPHERAL VASCULAR CATHETERIZATION  03/19/2016   Procedure: Peripheral Vascular Balloon Angioplasty-CFA Left;  Surgeon: MWellington Hampshire MD;  [facility];  Service: Cardiovascular;;   PERIPHERAL VASCULAR INTERVENTION Bilateral 03/16/2019   Procedure: PERIPHERAL VASCULAR INTERVENTION;  Surgeon: AWellington Hampshire MD;  [facility];  Service: Cardiovascular;  Laterality: Bilateral;  RENAL   PERIPHERAL VASCULAR INTERVENTION Bilateral 05/09/2020   Procedure: PERIPHERAL VASCULAR INTERVENTION;  Surgeon: AWellington Hampshire MD;  [facility];  Service: Cardiovascular;  Laterality: Bilateral;  Iliac   RENAL ANGIOGRAPHY  03/16/2019   RENAL ANGIOGRAPHY Bilateral 03/16/2019   Procedure: RENAL ANGIOGRAPHY;  Surgeon: AWellington Hampshire MD;  [facility];  Service: Cardiovascular;  Laterality: Bilateral;   TEE WITHOUT CARDIOVERSION N/A 06/25/2020   Procedure: TRANSESOPHAGEAL ECHOCARDIOGRAM (TEE);  Surgeon: AWonda Olds MD;  [place] [facility]  Service: Open Heart Surgery;  Laterality: N/A;   TUMOR REMOVAL     tumor removed from [place]     Current Outpatient Medications  Medication Sig Dispense Refill   acetaminophen (TYLENOL) 325 MG tablet Take  2 tablets (650 mg total) by mouth every 6 (six) hours as needed for mild pain (or Fever >/= 101). 20 tablet 0   albuterol (PROVENTIL HFA) 108 (90 Base) MCG/ACT inhaler Inhale 1-2 puffs into the lungs every 6 (six) hours as needed for wheezing or shortness of breath.     amLODipine (NORVASC) 5 MG tablet Take 1 tablet by mouth once daily 90 tablet 0   Ascorbic Acid (VITAMIN C) 1000 MG tablet Take 1,000 mg by mouth 3 (three) times daily.      aspirin EC 81 MG tablet Take 81 mg by mouth daily. In the morning     BD ULTRA-FINE PEN NEEDLES  29G X 12.7MM MISC USE PENNEEDLES 3 TIMES  DAILY AS DIRECTED 270 each 1   blood glucose meter kit and supplies Dispense based on patient and insurance preference. Use up to four times daily as directed. (FOR ICD-10 E10.9, E11.9). 1 each 0   Calcium Carbonate-Vit D-Min (CALTRATE 600+D PLUS MINERALS) 600-800 MG-UNIT TABS Take 1 tablet by mouth 3 (three) times daily.      carvedilol (COREG) 6.25 MG tablet Take 1 tablet by mouth twice daily 180 tablet 0   Cholecalciferol (VITAMIN D-3) 125 MCG (5000 UT) TABS Take 5,000 Units by mouth daily.     clopidogrel (PLAVIX) 75 MG tablet Take 1 tablet (75 mg total) by mouth daily. 90 tablet 1   fish oil-omega-3 fatty acids 1000 MG capsule Take 1 g by mouth daily.      glucose blood (GLUCOSE METER TEST) test strip Use 6-10 times a day, Uses ReliOn 900 each 12   halobetasol (ULTRAVATE) 0.05 % cream Apply 1 application topically daily as needed (For rash).     hydroxypropyl methylcellulose (ISOPTO TEARS) 2.5 % ophthalmic solution Place 1 drop into both eyes 3 (three) times daily as needed (for dry eyes).     Insulin Syringe 27G X 1/2" 0.5 ML MISC Use with Fiasp insulin TID 100 each 3   lisinopril (ZESTRIL) 20 MG tablet Take 1 tablet by mouth twice daily 180 tablet 0   montelukast (SINGULAIR) 10 MG tablet Take 1 tablet by mouth once daily with breakfast 90 tablet 2   ondansetron (ZOFRAN ODT) 4 MG disintegrating tablet Take 1 tablet (4 mg total) by mouth every 8 (eight) hours as needed for nausea or vomiting. 20 tablet 0   RELION PEN NEEDLES 29G X 12MM MISC USE 1 NEEDLE TO INJECT INSULIN SUBCUTANEOUSLY 3 TIMES DAILY (Patient taking differently: 1 Device by Subconjunctival route 3 (three) times daily.) 50 each 5   rosuvastatin (CRESTOR) 40 MG tablet Take 1 tablet (40 mg total) by mouth daily. 90 tablet 3   Semaglutide,0.25 or 0.5MG/DOS, (OZEMPIC, 0.25 OR 0.5 MG/DOSE,) 2 MG/1.5ML SOPN Inject 1 mg into the skin once a week. Friday 3 mL 1   HUMALOG KWIKPEN 200 UNIT/ML  KwikPen INJECT 50 UNITS SUBCUTANEOUSLY THREE TIMES DAILY BEFORE MEAL(S) 18 mL 0   No current facility-administered medications for this visit.    Allergies:   Tape    Social History:  The patient  reports that she quit smoking about 19 years ago. Her smoking use included cigarettes. She started smoking about 56 years ago. She has a 36.00 pack-year smoking history. She has never used smokeless tobacco. She reports that she does not drink alcohol and does not use drugs.   Family History:  The patient's family history includes Alcohol abuse in her brother; Diabetes in her brother, daughter, maternal grandmother, mother, and  son; Heart attack in her maternal grandfather; Heart disease in her brother; Hyperlipidemia in her mother and son; Hypertension in her daughter, mother, and son; Other in her sister and sister; Peripheral vascular disease in her mother.    ROS:  Please see the history of present illness.   Otherwise, review of systems are positive for none.   All other systems are reviewed and negative.    PHYSICAL EXAM: VS:  BP (!) 178/64   Pulse 66   Ht 5' 2"  (1.575 m)   Wt 206 lb 12.8 oz (93.8 kg)   SpO2 98%   BMI 37.82 kg/m  , BMI Body mass index is 37.82 kg/m. GEN: Well nourished, well developed, in no acute distress  HEENT: normal  Neck: no JVD, carotid bruits, or masses Cardiac: RRR; no  rubs, or gallops , trace edema . There is 2/6 systolic ejection murmur in the aortic area.  Sternal wound appears well-healed at the present time. Respiratory:  clear to auscultation bilaterally, normal work of breathing GI: soft, nontender, nondistended, + BS MS: no deformity or atrophy  Skin: warm and dry, no rash Neuro:  Strength and sensation are intact Psych: euthymic mood, full affect Distal pulses are not palpable.    EKG:  EKG is  ordered today. EKG showed normal sinus rhythm with possible left atrial enlargement and nonspecific T wave changes.   Recent Labs: 06/26/2020:  Magnesium 2.2 11/23/2020: ALT 18; Hemoglobin 14.1; Platelets 253 11/27/2020: BUN 11; Creatinine, Ser 0.76; Potassium 5.1; Sodium 141    Lipid Panel    Component Value Date/Time   CHOL 188 11/23/2020 1130   TRIG 128 11/23/2020 1130   TRIG 135 09/16/2012 1322   HDL 75 11/23/2020 1130   HDL 60 09/16/2012 1322   CHOLHDL 2.5 11/23/2020 1130   CHOLHDL 3.2 05/06/2012 1713   VLDL 32 05/06/2012 1713   LDLCALC 91 11/23/2020 1130   LDLCALC 92 09/16/2012 1322      Wt Readings from Last 3 Encounters:  01/08/21 206 lb 12.8 oz (93.8 kg)  11/23/20 202 lb (91.6 kg)  11/23/20 202 lb (91.6 kg)       No flowsheet data found.    ASSESSMENT AND PLAN:  1. Peripheral arterial disease: Status post  kissing stent placement to the common iliac arteries due to severe right calf claudication as well as nonhealing ulceration on the right small toe.  No recurrent ulceration.  Most recent Doppler studies in October showed stable ABI and patent iliac stents.  Repeat studies in October 2023.  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  Blood pressure is reasonably controlled.  Most recent angiography did show significant restenosis on the right side.  Blood pressure is elevated but renal function remains normal.  I requested a follow-up renal artery duplex.  3. Coronary artery disease involving native coronary arteries without angina: Symptoms resolved after CABG and she is doing well.  .      4. Hyperlipidemia: I reviewed most recent lipid profile with her which showed an LDL of 91.  Her previous LDL was 66.  I discussed with her the importance of improving her diet and taking rosuvastatin 40 mg daily on a regular basis.   Disposition:   FU with me in 6  month  Signed,  [name], MD  01/09/2021 6:06 PM    [place]

## 2021-01-11 ENCOUNTER — Other Ambulatory Visit: Payer: Self-pay | Admitting: Family

## 2021-01-11 DIAGNOSIS — I152 Hypertension secondary to endocrine disorders: Secondary | ICD-10-CM

## 2021-01-11 DIAGNOSIS — E1159 Type 2 diabetes mellitus with other circulatory complications: Secondary | ICD-10-CM

## 2021-01-16 ENCOUNTER — Other Ambulatory Visit (HOSPITAL_COMMUNITY): Payer: Self-pay | Admitting: Cardiovascular Disease

## 2021-01-16 DIAGNOSIS — I739 Peripheral vascular disease, unspecified: Secondary | ICD-10-CM

## 2021-01-16 DIAGNOSIS — Z95828 Presence of other vascular implants and grafts: Secondary | ICD-10-CM

## 2021-01-29 ENCOUNTER — Ambulatory Visit: Payer: Medicare Other | Admitting: Cardiovascular Disease

## 2021-02-01 ENCOUNTER — Telehealth: Payer: Self-pay | Admitting: Family

## 2021-02-01 NOTE — Telephone Encounter (Signed)
Per Almyra Free, pt has probably used all of the medication in the pen. Advised pt of this, but she would like to speak to [place]. Forwarded call to [place].

## 2021-02-01 NOTE — Telephone Encounter (Signed)
Pt says she doesn't turn the Ozempic Pen to 0.25 or 0.50.. says she only has to turn it just a little bit before giving herself the shot. Says she has had insulin before that required her to turn pen to 0.25 or 0.50 but this one she was given does not do that with the numbers.

## 2021-02-01 NOTE — Telephone Encounter (Signed)
Instructed patient to take 1mg  of her ozempic 2mg  sample pen Dial 30 clicks

## 2021-02-01 NOTE — Telephone Encounter (Signed)
Left message for pt to return call.  She currently has Humalog 50 units prior to meals and Ozempic weekly. Not sure if she has been taking the 0.25 or .5?

## 2021-02-03 ENCOUNTER — Other Ambulatory Visit: Payer: Self-pay | Admitting: Family

## 2021-02-22 ENCOUNTER — Encounter: Payer: Self-pay | Admitting: Family

## 2021-02-22 ENCOUNTER — Telehealth: Payer: Self-pay | Admitting: Family

## 2021-02-22 ENCOUNTER — Ambulatory Visit (INDEPENDENT_AMBULATORY_CARE_PROVIDER_SITE_OTHER): Payer: Medicare Other | Admitting: Family

## 2021-02-22 VITALS — BP 155/54 | HR 65 | Temp 97.3°F | Ht 62.0 in | Wt 203.8 lb

## 2021-02-22 DIAGNOSIS — I252 Old myocardial infarction: Secondary | ICD-10-CM | POA: Diagnosis not present

## 2021-02-22 DIAGNOSIS — E1159 Type 2 diabetes mellitus with other circulatory complications: Secondary | ICD-10-CM | POA: Diagnosis not present

## 2021-02-22 DIAGNOSIS — I152 Hypertension secondary to endocrine disorders: Secondary | ICD-10-CM | POA: Diagnosis not present

## 2021-02-22 DIAGNOSIS — I5032 Chronic diastolic (congestive) heart failure: Secondary | ICD-10-CM | POA: Diagnosis not present

## 2021-02-22 DIAGNOSIS — I739 Peripheral vascular disease, unspecified: Secondary | ICD-10-CM

## 2021-02-22 DIAGNOSIS — I2510 Atherosclerotic heart disease of native coronary artery without angina pectoris: Secondary | ICD-10-CM

## 2021-02-22 DIAGNOSIS — E1151 Type 2 diabetes mellitus with diabetic peripheral angiopathy without gangrene: Secondary | ICD-10-CM

## 2021-02-22 DIAGNOSIS — Z951 Presence of aortocoronary bypass graft: Secondary | ICD-10-CM | POA: Diagnosis not present

## 2021-02-22 DIAGNOSIS — E1142 Type 2 diabetes mellitus with diabetic polyneuropathy: Secondary | ICD-10-CM | POA: Diagnosis not present

## 2021-02-22 DIAGNOSIS — I5033 Acute on chronic diastolic (congestive) heart failure: Secondary | ICD-10-CM | POA: Diagnosis not present

## 2021-02-22 DIAGNOSIS — Z794 Long term (current) use of insulin: Secondary | ICD-10-CM | POA: Diagnosis not present

## 2021-02-22 LAB — CBC WITH DIFFERENTIAL/PLATELET
Basophils Absolute: 0 10*3/uL (ref 0.0–0.2)
Basos: 0 %
EOS (ABSOLUTE): 0.3 10*3/uL (ref 0.0–0.4)
Eos: 2 %
Hematocrit: 44.1 % (ref 34.0–46.6)
Hemoglobin: 14.4 g/dL (ref 11.1–15.9)
Immature Grans (Abs): 0 10*3/uL (ref 0.0–0.1)
Immature Granulocytes: 0 %
Lymphocytes Absolute: 2.8 10*3/uL (ref 0.7–3.1)
Lymphs: 25 %
MCH: 30.6 pg (ref 26.6–33.0)
MCHC: 32.7 g/dL (ref 31.5–35.7)
MCV: 94 fL (ref 79–97)
Monocytes Absolute: 0.7 10*3/uL (ref 0.1–0.9)
Monocytes: 6 %
Neutrophils Absolute: 7.5 10*3/uL — ABNORMAL HIGH (ref 1.4–7.0)
Neutrophils: 67 %
Platelets: 258 10*3/uL (ref 150–450)
RBC: 4.7 x10E6/uL (ref 3.77–5.28)
RDW: 13.7 % (ref 11.7–15.4)
WBC: 11.3 10*3/uL — ABNORMAL HIGH (ref 3.4–10.8)

## 2021-02-22 LAB — CMP14+EGFR
ALT: 20 IU/L (ref 0–32)
AST: 14 IU/L (ref 0–40)
Albumin/Globulin Ratio: 1.3 (ref 1.2–2.2)
Albumin: 4.2 g/dL (ref 3.8–4.8)
Alkaline Phosphatase: 68 IU/L (ref 44–121)
BUN/Creatinine Ratio: 19 (ref 12–28)
BUN: 13 mg/dL (ref 8–27)
Bilirubin Total: 0.3 mg/dL (ref 0.0–1.2)
CO2: 29 mmol/L (ref 20–29)
Calcium: 9.9 mg/dL (ref 8.7–10.3)
Chloride: 99 mmol/L (ref 96–106)
Creatinine, Ser: 0.68 mg/dL (ref 0.57–1.00)
Globulin, Total: 3.3 g/dL (ref 1.5–4.5)
Glucose: 132 mg/dL — ABNORMAL HIGH (ref 70–99)
Potassium: 4.7 mmol/L (ref 3.5–5.2)
Sodium: 138 mmol/L (ref 134–144)
Total Protein: 7.5 g/dL (ref 6.0–8.5)
eGFR: 94 mL/min/{1.73_m2} (ref >59)

## 2021-02-22 LAB — LIPID PANEL
Chol/HDL Ratio: 2.5 ratio (ref 0.0–4.4)
Cholesterol, Total: 171 mg/dL (ref 100–199)
HDL: 69 mg/dL (ref >39)
LDL Chol Calc (NIH): 78 mg/dL (ref 0–99)
Triglycerides: 140 mg/dL (ref 0–149)
VLDL Cholesterol Cal: 24 mg/dL (ref 5–40)

## 2021-02-22 LAB — BAYER DCA HB A1C WAIVED: HB A1C (BAYER DCA - WAIVED): 8.2 % — ABNORMAL HIGH (ref 4.8–5.6)

## 2021-02-22 NOTE — Progress Notes (Signed)
Subjective:    Patient ID: Leah Ferrell, female    DOB: 1950-10-15, 71 y.o.   MRN: [id number]  Chief Complaint  Patient presents with   Medical Management of Chronic Issues   Pt presents to the office today for chronic follow up. Pt has CHF, CAD and hx MI and is followed by Cardiologists every 6 months. PT is followed by Vascular  for PVD and PAD. PT states these are stable.   Pt had a renal angiography on 03/16/19. She reports her swelling is improved and her BP is improved.     She had critical lower limb ischemia and had abdominal aortogram with lower extremity. 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    Pt is morbid obese because her BMI is 37 and has DM and HTN.  Hypertension This is a chronic problem. The current episode started more than 1 year ago. The problem has been waxing and waning since onset. The problem is uncontrolled. Associated symptoms include malaise/fatigue, peripheral edema and shortness of breath. Pertinent negatives include no blurred vision. Risk factors for coronary artery disease include dyslipidemia, diabetes mellitus, obesity, sedentary lifestyle and smoking/tobacco exposure. The current treatment provides moderate improvement. Hypertensive end-organ damage includes CAD/MI and heart failure.  Diabetes She presents for her follow-up diabetic visit. She has type 2 diabetes mellitus. Associated symptoms include fatigue and foot paresthesias. Pertinent negatives for diabetes include no blurred vision. Symptoms are stable. Diabetic complications include heart disease and peripheral neuropathy. Risk factors for coronary artery disease include dyslipidemia, diabetes mellitus, hypertension, sedentary lifestyle and post-menopausal. She is following a generally unhealthy diet. Her overall blood glucose range is >200 mg/dl. An ACE inhibitor/angiotensin II receptor blocker is being taken.  Congestive Heart Failure Presents for follow-up visit. Associated symptoms include edema,  fatigue and shortness of breath. The symptoms have been stable.     Review of Systems  Constitutional:  Positive for fatigue and malaise/fatigue.  Eyes:  Negative for blurred vision.  Respiratory:  Positive for shortness of breath.   All other systems reviewed and are negative.     Objective:   Physical Exam Vitals reviewed.  Constitutional:      General: She is not in acute distress.    Appearance: She is well-developed. She is obese.  HENT:     Head: Normocephalic and atraumatic.     Right Ear: Tympanic membrane normal.     Left Ear: Tympanic membrane normal.  Eyes:     Pupils: Pupils are equal, round, and reactive to light.  Neck:     Thyroid: No thyromegaly.  Cardiovascular:     Rate and Rhythm: Normal rate and regular rhythm.     Heart sounds: Normal heart sounds. No murmur heard. Pulmonary:     Effort: Pulmonary effort is normal. No respiratory distress.     Breath sounds: Normal breath sounds. No wheezing.  Abdominal:     General: Bowel sounds are normal. There is no distension.     Palpations: Abdomen is soft.     Tenderness: There is no abdominal tenderness.  Musculoskeletal:        General: No tenderness. Normal range of motion.     Cervical back: Normal range of motion and neck supple.     Right lower leg: Edema (trace) present.     Left lower leg: Edema (trace) present.  Skin:    General: Skin is warm and dry.  Neurological:     Mental Status: She is alert and oriented to  person, place, and time.     Cranial Nerves: No cranial nerve deficit.     Deep Tendon Reflexes: Reflexes are normal and symmetric.  Psychiatric:        Behavior: Behavior normal.        Thought Content: Thought content normal.        Judgment: Judgment normal.      BP (!) 155/54    Pulse 65    Temp (!) 97.3 F (36.3 C) (Temporal)    Ht 5' 2" (1.575 m)    Wt 203 lb 12.8 oz (92.4 kg)    BMI 37.28 kg/m      Assessment & Plan:  Leah Ferrell comes in today with chief complaint  of Medical Management of Chronic Issues   Diagnosis and orders addressed: 1. Type 2 diabetes mellitus with diabetic polyneuropathy, with long-term current use of insulin (HCC) - Bayer DCA Hb A1c Waived - CBC with Differential/Platelet - CMP14+EGFR - Lipid panel  2. PVD (peripheral vascular disease) ([place])  3. PAD (peripheral artery disease) ([place])  4. Hypertension associated with diabetes ([place])  5. Diabetes mellitus with peripheral vascular disease ([place])  6. Coronary artery disease involving native coronary artery of native heart without angina pectoris  7. Chronic diastolic heart failure ([place])  8. Acute on chronic diastolic CHF (congestive heart failure) ([place])  9. S/P CABG x 3  10. Morbid obesity ([place])  11. Hx of non-ST elevation myocardial infarction (NSTEMI)    Labs pending Health Maintenance reviewed Diet and exercise encouraged  Follow up plan: 3 months    Evelina Dun, FNP

## 2021-02-22 NOTE — Patient Instructions (Signed)

## 2021-02-27 NOTE — Telephone Encounter (Signed)
Will close encounter

## 2021-03-05 ENCOUNTER — Other Ambulatory Visit: Payer: Self-pay | Admitting: Family

## 2021-03-06 NOTE — Progress Notes (Signed)
Received notification from [place] regarding approval for [place]. Patient assistance approved UNTIL 01/16/22.  ALL MEDICATIONS WILL BE SENT THIS WEEK TO [facility]  Phone: [phone]

## 2021-03-07 ENCOUNTER — Telehealth: Payer: Self-pay | Admitting: Family

## 2021-03-07 NOTE — Telephone Encounter (Signed)
Pt needs to talk to Almyra Free about her bank statement. Please call back.

## 2021-03-18 ENCOUNTER — Other Ambulatory Visit: Payer: Self-pay | Admitting: Family

## 2021-03-18 DIAGNOSIS — I152 Hypertension secondary to endocrine disorders: Secondary | ICD-10-CM

## 2021-03-18 DIAGNOSIS — E1159 Type 2 diabetes mellitus with other circulatory complications: Secondary | ICD-10-CM

## 2021-03-20 ENCOUNTER — Other Ambulatory Visit: Payer: Self-pay | Admitting: Family

## 2021-03-20 NOTE — Telephone Encounter (Signed)
Last office visit 02/22/21 Never filled here

## 2021-03-26 ENCOUNTER — Other Ambulatory Visit: Payer: Self-pay | Admitting: Family

## 2021-03-26 DIAGNOSIS — E1169 Type 2 diabetes mellitus with other specified complication: Secondary | ICD-10-CM

## 2021-03-26 DIAGNOSIS — E785 Hyperlipidemia, unspecified: Secondary | ICD-10-CM

## 2021-03-27 ENCOUNTER — Telehealth: Payer: Self-pay | Admitting: Family

## 2021-03-27 NOTE — Telephone Encounter (Signed)
Patient aware and verbalized understanding. °

## 2021-04-11 ENCOUNTER — Other Ambulatory Visit: Payer: Self-pay | Admitting: Family

## 2021-04-16 ENCOUNTER — Other Ambulatory Visit: Payer: Self-pay | Admitting: Cardiovascular Disease

## 2021-04-17 NOTE — Telephone Encounter (Signed)
Refill request

## 2021-04-25 ENCOUNTER — Telehealth: Payer: Self-pay

## 2021-04-25 NOTE — Telephone Encounter (Signed)
Fiasp 8 boxes came in through patient assistance.  Placed in refrigerator, patient aware. ?

## 2021-05-07 ENCOUNTER — Telehealth: Payer: Self-pay | Admitting: Family

## 2021-05-07 ENCOUNTER — Other Ambulatory Visit: Payer: Self-pay | Admitting: Family

## 2021-05-07 NOTE — Telephone Encounter (Signed)
Appt scheduled for tomorrow.  °

## 2021-05-08 ENCOUNTER — Ambulatory Visit (INDEPENDENT_AMBULATORY_CARE_PROVIDER_SITE_OTHER): Payer: Medicare Other | Admitting: Pharmacist

## 2021-05-08 DIAGNOSIS — E1142 Type 2 diabetes mellitus with diabetic polyneuropathy: Secondary | ICD-10-CM

## 2021-05-08 DIAGNOSIS — I2510 Atherosclerotic heart disease of native coronary artery without angina pectoris: Secondary | ICD-10-CM

## 2021-05-08 NOTE — Progress Notes (Signed)
? ? ?Chronic Care Management ?Pharmacy Note ? ?05/08/2021 ?Name:  Leah Ferrell MRN:  [id number] DOB:  12-07-1950 ? ?Summary: T2DM ? ?Recommendations/Changes made from today's visit: ? ?Diabetes: New goal. ?Uncontrolled, A1c 8.2%--patient s/p open heart surgery last June 2022  ?New regimen:  ?1. TRESIBA 15-20 UNITS NIGHTLY (LONG ACTING) ?2. FIASP (SHORT ACTING INSULIN) 20-30 UNITS WITH MEALS 3x DAILY ?3. OZEMPIC 20m WEEKLY Denies personal and family history of Medullary thyroid cancer (MTC) ?Consider adding farxiga given cardiac history ?Current glucose readings: fasting glucose: <170, post prandial glucose: n/a ?Consider libre pending patient preference ?Denies hypoglycemic/hyperglycemic symptoms ?Current exercise: bike at home ?Educated on new basal insulin; new regimen; patient was only using rapid acting insulin--nervous to start basal/bolus--will go slow & titrate up.  Recommend heart healthy diet/plate method given [id number] ?Assessed patient finances. Enrolled in the [employer] patient assistance program ? ?Subjective: ?Leah BUISis an 71y.o. year old female who is a primary patient of HSharion Balloon FNP.  The CCM team was consulted for assistance with disease management and care coordination needs.   ? ?Engaged with patient face to face for initial visit in response to provider referral for pharmacy case management and/or care coordination services.  ? ?Consent to Services:  ?The patient was given the following information about Chronic Care Management services today, agreed to services, and gave verbal consent: 1. CCM service includes personalized support from designated clinical staff supervised by the primary care provider, including individualized plan of care and coordination with other care providers 2. 24/7 contact phone numbers for assistance for urgent and routine care needs. 3. Service will only be billed when office clinical staff spend 20 minutes or more in a month to  coordinate care. 4. Only one practitioner may furnish and bill the service in a calendar month. 5.The patient may stop CCM services at any time (effective at the end of the month) by phone call to the office staff. 6. The patient will be responsible for cost sharing (co-pay) of up to 20% of the service fee (after annual deductible is met). Patient agreed to services and consent obtained. ? ?Patient Care Team: ?HSharion Balloon FNP as PCP - General (Nurse Practitioner) ?AWellington Hampshire MD as PCP - Cardiology (Cardiology) ?AWellington Hampshire MD as Consulting Physician (Cardiology) ?[facility]avera Guise [facility](Pharmacist) ? ?Objective: ? ?Lab Results  ?Component Value Date  ? CREATININE 0.68 02/22/2021  ? CREATININE 0.76 11/27/2020  ? CREATININE 0.70 11/23/2020  ? ? ?Lab Results  ?Component Value Date  ? HGBA1C 8.2 (H) 02/22/2021  ? ?Last diabetic Eye exam:  ?Lab Results  ?Component Value Date/Time  ? HMDIABEYEEXA No Retinopathy 09/26/2020 12:00 AM  ?  ?Last diabetic Foot exam: No results found for: HMDIABFOOTEX  ? ?   ?Component Value Date/Time  ? CHOL 171 02/22/2021 1103  ? TRIG 140 02/22/2021 1103  ? TRIG 135 09/16/2012 1322  ? HDL 69 02/22/2021 1103  ? HDL 60 09/16/2012 1322  ? CHOLHDL 2.5 02/22/2021 1103  ? CHOLHDL 3.2 05/06/2012 1713  ? VLDL 32 05/06/2012 1713  ? L[place]78 02/22/2021 1103  ? LS[place]92 09/16/2012 1322  ? ? ? ?  Latest Ref Rng & Units 02/22/2021  ? 11:03 AM 11/23/2020  ? 11:30 AM 08/23/2020  ? 12:00 PM  ?Hepatic Function  ?Total Protein 6.0 - 8.5 g/dL 7.5   7.6   7.5    ?Albumin 3.8 - 4.8 g/dL 4.2   4.3  4.4    ?AST 0 - 40 IU/L 14   18   21     ?ALT 0 - 32 IU/L 20   18   18     ?Alk Phosphatase 44 - 121 IU/L 68   76   71    ?Total Bilirubin 0.0 - 1.2 mg/dL 0.3   0.3   0.2    ? ? ?Lab Results  ?Component Value Date/Time  ? TSH 1.470 12/07/2018 12:24 PM  ? TSH 1.550 04/05/2014 01:16 PM  ? ? ? ?  Latest Ref Rng & Units 02/22/2021  ? 11:03 AM 11/23/2020  ? 11:30 AM 08/23/2020  ? 12:00 PM  ?CBC  ?WBC 3.4 - 10.8  x10E3/uL 11.3   10.0   11.9    ?Hemoglobin 11.1 - 15.9 g/dL 14.4   14.1   13.1    ?Hematocrit 34.0 - 46.6 % 44.1   44.5   41.8    ?Platelets 150 - 450 x10E3/uL 258   253   301    ? ? ?Lab Results  ?Component Value Date/Time  ? VD25OH 41.9 08/07/2014 12:23 PM  ? VD25OH 37.5 04/05/2014 01:16 PM  ? ? ?Clinical ASCVD: Yes  ?The ASCVD Risk score (Arnett DK, et al., 2019) failed to calculate for the following reasons: ?  The patient has a prior MI or stroke diagnosis   ? ?Other: (CHADS2VASc if Afib, PHQ9 if depression, MMRC or CAT for COPD, ACT, DEXA) ? ?Social History  ? ?Tobacco Use  ?Smoking Status Former  ? Packs/day: 1.00  ? Years: 36.00  ? Pack years: 36.00  ? Types: Cigarettes  ? Start date: 04/21/1964  ? Quit date: 02/17/2001  ? Years since quitting: 20.2  ?Smokeless Tobacco Never  ? ?BP Readings from Last 3 Encounters:  ?02/22/21 (!) 155/54  ?01/08/21 (!) 178/64  ?11/23/20 (!) 150/76  ? ?Pulse Readings from Last 3 Encounters:  ?02/22/21 65  ?01/08/21 66  ?11/23/20 72  ? ?Wt Readings from Last 3 Encounters:  ?02/22/21 203 lb 12.8 oz (92.4 kg)  ?01/08/21 206 lb 12.8 oz (93.8 kg)  ?11/23/20 202 lb (91.6 kg)  ? ? ?Assessment: Review of patient past medical history, allergies, medications, health status, including review of consultants reports, laboratory and other test data, was performed as part of comprehensive evaluation and provision of chronic care management services.  ? ?SDOH:  (Social Determinants of Health) assessments and interventions performed:  ? ? ?CCM Care Plan ? ?Allergies  ?Allergen Reactions  ? Tape Rash  ? ? ?Medications Reviewed Today   ? ? Reviewed by Lavera Guise, [facility] (Pharmacist) on 05/22/21 at 73  Med List Status: <None>  ? ?Medication Order Taking? Sig Documenting Provider Last Dose Status Informant  ?acetaminophen (TYLENOL) 325 MG tablet [id number]  Take 2 tablets (650 mg total) by mouth every 6 (six) hours as needed for mild pain (or Fever >/= 101). Roxan Hockey, MD  Active Child   ?albuterol (VENTOLIN HFA) 108 (90 Base) MCG/ACT inhaler [id number]  INHALE 1 TO 2 PUFFS BY MOUTH EVERY 6 HOURS AS NEEDED FOR WHEEZING AND FOR SHORTNESS OF BREATH Hawks, Christy A, FNP  Active   ?amLODipine (NORVASC) 5 MG tablet [id number]  Take 1 tablet by mouth once daily Sharion Balloon, FNP  Active   ?Ascorbic Acid (VITAMIN C) 1000 MG tablet [id number]  Take 1,000 mg by mouth 3 (three) times daily.  [provider]  Active Child  ?aspirin EC 81 MG tablet [id number]  Take 81  mg by mouth daily. In the morning [provider]  Active Child  ?         ?Med Note Laurin Coder, MARIAN A   Tue Mar 11, 2016 10:47 AM)    ?BD ULTRA-FINE PEN NEEDLES 29G X 12.7MM MISC [id number]  USE PENNEEDLES 3 TIMES  DAILY AS DIRECTED Evelina Dun A, FNP  Active Child  ?blood glucose meter kit and supplies [id number]  Dispense based on patient and insurance preference. Use up to four times daily as directed. (FOR ICD-10 E10.9, E11.9). Sharion Balloon, FNP  Active Child  ?Calcium Carbonate-Vit D-Min (CALTRATE 600+D PLUS MINERALS) 600-800 MG-UNIT TABS [id number]  Take 1 tablet by mouth 3 (three) times daily.  [provider]  Active Child  ?carvedilol (COREG) 6.25 MG tablet [id number]  Take 1 tablet (6.25 mg total) by mouth 2 (two) times daily. Wellington Hampshire, MD  Active   ?Cholecalciferol (VITAMIN D-3) 125 MCG (5000 UT) TABS [id number]  Take 5,000 Units by mouth daily. [provider]  Active Child  ?clopidogrel (PLAVIX) 75 MG tablet [id number]  Take 1 tablet by mouth once daily Evelina Dun A, FNP  Active   ?fish oil-omega-3 fatty acids 1000 MG capsule [id number]  Take 1 g by mouth daily.  [provider]  Active Child  ?glucose blood (GLUCOSE METER TEST) test strip [id number]  Use 6-10 times a day, Uses ReliOn Hawks, Alyse Low A, FNP  Active Child  ?halobetasol (ULTRAVATE) 0.05 % cream [id number]  Apply 1 application topically daily as needed (For rash). [name], PA-C  Active   ?HUMALOG KWIKPEN 200  UNIT/ML KwikPen [id number]  INJECT 50 UNITS SUBCUTANEOUSLY THREE TIMES DAILY WITH MEALS Hawks, Theador Hawthorne, FNP  Active   ?         ?Med Note (Yoselyn Mcglade D   Wed May 22, 2021 11:15 AM) 20-30 UNITS ?  ?hydroxyp

## 2021-05-16 ENCOUNTER — Other Ambulatory Visit: Payer: Self-pay | Admitting: Family

## 2021-05-17 DIAGNOSIS — E1142 Type 2 diabetes mellitus with diabetic polyneuropathy: Secondary | ICD-10-CM

## 2021-05-17 DIAGNOSIS — Z794 Long term (current) use of insulin: Secondary | ICD-10-CM

## 2021-05-17 DIAGNOSIS — I2510 Atherosclerotic heart disease of native coronary artery without angina pectoris: Secondary | ICD-10-CM

## 2021-05-22 NOTE — Patient Instructions (Signed)
Visit Information ? ?Following are the goals we discussed today:  ?Current Barriers:  ?Unable to independently afford treatment regimen ?Unable to achieve control of T2DM  ? ? ?Pharmacist Clinical Goal(s):  ?patient will verbalize ability to afford treatment regimen ?achieve control of T2DM as evidenced by GOAL A1C<7% through collaboration with PharmD and provider.  ? ?Interventions: ?1:1 collaboration with Sharion Balloon, FNP regarding development and update of comprehensive plan of care as evidenced by provider attestation and co-signature ?Inter-disciplinary care team collaboration (see longitudinal plan of care) ?Comprehensive medication review performed; medication list updated in electronic medical record ? ?Diabetes: New goal. ?Uncontrolled, A1c 8.2%--patient s/p open heart surgery;  ?New regimen:  ?1. TRESIBA 15-20 UNITS NIGHTLY (LONG ACTING) ?2. FIASP (SHORT ACTING INSULIN) 20-30 UNITS WITH MEALS 3x DAILY ?3. OZEMPIC '1mg'$  WEEKLY;  ?Current glucose readings: fasting glucose: <170, post prandial glucose: n/a ?Consider libre pending patient preference ?Denies hypoglycemic/hyperglycemic symptoms ?Current exercise: bike at home ?Educated on new basal insulin; new regimen; patient was only using rapid acting insulin--nervous to start basal/bolus--will go slow & titrate up.  Recommend heart healthy diet/plate method given [id number] issues ?Assessed patient finances. Enrolled in the [employer] patient assistance program ? ? ?Patient Goals/Self-Care Activities ?patient will:  ?- take medications as prescribed as evidenced by patient report and record review ?check glucose 4 times daily , document, and provide at future appointments ?collaborate with provider on medication access solutions ?target a minimum of 150 minutes of moderate intensity exercise weekly ?engage in dietary modifications by FOLLOWING A HEART HEALTHY DIET/HEALTHY PLATE METHOD ? ? ? ?Plan: Telephone follow up appointment with care  management team member scheduled for:  06/06/21 ? ?Signature ?Regina Eck, PharmD, BCPS ?Clinical Pharmacist, [facility] ?[place]  [place] Phone [phone] ? ? ?Please call the care guide team at [phone] if you need to cancel or reschedule your appointment.  ? ?The patient verbalized understanding of instructions, educational materials, and care plan provided today and declined offer to receive copy of patient instructions, educational materials, and care plan.  ? ?

## 2021-05-23 ENCOUNTER — Ambulatory Visit: Payer: Medicare Other | Admitting: Family

## 2021-06-06 ENCOUNTER — Ambulatory Visit (INDEPENDENT_AMBULATORY_CARE_PROVIDER_SITE_OTHER): Payer: Medicare Other | Admitting: Pharmacist

## 2021-06-06 DIAGNOSIS — E1151 Type 2 diabetes mellitus with diabetic peripheral angiopathy without gangrene: Secondary | ICD-10-CM

## 2021-06-06 DIAGNOSIS — E1159 Type 2 diabetes mellitus with other circulatory complications: Secondary | ICD-10-CM

## 2021-06-06 NOTE — Progress Notes (Signed)
? ? ?Chronic Care Management ?Pharmacy Note ? ?06/06/2021 ?Name:  Leah Ferrell MRN:  [id number] DOB:  08-22-1950 ? ?Summary: ?Diabetes: Goal on Track (progressing): YES. ?Uncontrolled, A1c 8.2%--patient s/p open heart surgery last may 2022;  ?New regimen:  ?1. TRESIBA 15-20 UNITS NIGHTLY (LONG ACTING) ?2. FIASP (SHORT ACTING INSULIN) 20-30 UNITS WITH MEALS 3x DAILY ?3. OZEMPIC 57m WEEKLY;  ?Current glucose readings: fasting glucose: <170, 130s per patient, post prandial glucose: n/a ?Consider libre pending patient preference ?Denies hypoglycemic/hyperglycemic symptoms ?Current exercise: bike at home ?Educated on new basal insulin; new regimen; patient was only using rapid acting insulin--nervous to start basal/bolus--will go slow & titrate up.  Recommend heart healthy diet/plate method given [id number] ?Assessed patient finances. Enrolled in the [employer] patient assistance program ? ?Subjective: ?Leah FIORILLOis an 71y.o. year old female who is a primary patient of HSharion Balloon FNP.  The CCM team was consulted for assistance with disease management and care coordination needs.   ? ?Engaged with patient by telephone for follow up visit in response to provider referral for pharmacy case management and/or care coordination services.  ? ?Consent to Services:  ?The patient was given information about Chronic Care Management services, agreed to services, and gave verbal consent prior to initiation of services.  Please see initial visit note for detailed documentation.  ? ?Patient Care Team: ?HSharion Balloon FNP as PCP - General (Nurse Practitioner) ?AWellington Hampshire MD as PCP - Cardiology (Cardiology) ?AWellington Hampshire MD as Consulting Physician (Cardiology) ?[facility]avera Guise [facility](Pharmacist) ? ?Objective: ? ?Lab Results  ?Component Value Date  ? CREATININE 0.68 02/22/2021  ? CREATININE 0.76 11/27/2020  ? CREATININE 0.70 11/23/2020  ? ? ?Lab Results  ?Component Value Date  ? HGBA1C 8.2  (H) 02/22/2021  ? ?Last diabetic Eye exam:  ?Lab Results  ?Component Value Date/Time  ? HMDIABEYEEXA No Retinopathy 09/26/2020 12:00 AM  ?  ?Last diabetic Foot exam: No results found for: HMDIABFOOTEX  ? ?   ?Component Value Date/Time  ? CHOL 171 02/22/2021 1103  ? TRIG 140 02/22/2021 1103  ? TRIG 135 09/16/2012 1322  ? HDL 69 02/22/2021 1103  ? HDL 60 09/16/2012 1322  ? CHOLHDL 2.5 02/22/2021 1103  ? CHOLHDL 3.2 05/06/2012 1713  ? VLDL 32 05/06/2012 1713  ? L[place]78 02/22/2021 1103  ? L[place]92 09/16/2012 1322  ? ? ? ?  Latest Ref Rng & Units 02/22/2021  ? 11:03 AM 11/23/2020  ? 11:30 AM 08/23/2020  ? 12:00 PM  ?Hepatic Function  ?Total Protein 6.0 - 8.5 g/dL 7.5   7.6   7.5    ?Albumin 3.8 - 4.8 g/dL 4.2   4.3   4.4    ?AST 0 - 40 IU/L _0 ?ALT 0 - 32 IU/L _1 ?Alk Phosphatase 44 - 121 IU/L 68   76   71    ?Total Bilirubin 0.0 - 1.2 mg/dL 0.3   0.3   0.2    ? ? ?Lab Results  ?Component Value Date/Time  ? TSH 1.470 12/07/2018 12:24 PM  ? TSH 1.550 04/05/2014 01:16 PM  ? ? ? ?  Latest Ref Rng & Units 02/22/2021  ? 11:03 AM 11/23/2020  ? 11:30 AM 08/23/2020  ? 12:00 PM  ?CBC  ?WBC 3.4 - 10.8 x10E3/uL 11.3   10.0   11.9    ?Hemoglobin  11.1 - 15.9 g/dL 14.4   14.1   13.1    ?Hematocrit 34.0 - 46.6 % 44.1   44.5   41.8    ?Platelets 150 - 450 x10E3/uL 258   253   301    ? ? ?Lab Results  ?Component Value Date/Time  ? VD25OH 41.9 08/07/2014 12:23 PM  ? VD25OH 37.5 04/05/2014 01:16 PM  ? ? ?Clinical ASCVD: Yes  ?The ASCVD Risk score (Arnett DK, et al., 2019) failed to calculate for the following reasons: ?  The patient has a prior MI or stroke diagnosis   ? ?Other: (CHADS2VASc if Afib, PHQ9 if depression, MMRC or CAT for COPD, ACT, DEXA) ? ?Social History  ? ?Tobacco Use  ?Smoking Status Former  ? Packs/day: 1.00  ? Years: 36.00  ? Pack years: 36.00  ? Types: Cigarettes  ? Start date: 04/21/1964  ? Quit date: 02/17/2001  ? Years since quitting: 20.3  ?Smokeless Tobacco Never  ? ?BP Readings from Last 3  Encounters:  ?02/22/21 (!) 155/54  ?01/08/21 (!) 178/64  ?11/23/20 (!) 150/76  ? ?Pulse Readings from Last 3 Encounters:  ?02/22/21 65  ?01/08/21 66  ?11/23/20 72  ? ?Wt Readings from Last 3 Encounters:  ?02/22/21 203 lb 12.8 oz (92.4 kg)  ?01/08/21 206 lb 12.8 oz (93.8 kg)  ?11/23/20 202 lb (91.6 kg)  ? ? ?Assessment: Review of patient past medical history, allergies, medications, health status, including review of consultants reports, laboratory and other test data, was performed as part of comprehensive evaluation and provision of chronic care management services.  ? ?SDOH:  (Social Determinants of Health) assessments and interventions performed:  ? ? ?CCM Care Plan ? ?Allergies  ?Allergen Reactions  ? Tape Rash  ? ? ?Medications Reviewed Today   ? ? Reviewed by Lavera Guise, [facility] (Pharmacist) on 05/22/21 at 39  Med List Status: <None>  ? ?Medication Order Taking? Sig Documenting Provider Last Dose Status Informant  ?acetaminophen (TYLENOL) 325 MG tablet [id number]  Take 2 tablets (650 mg total) by mouth every 6 (six) hours as needed for mild pain (or Fever >/= 101). Roxan Hockey, MD  Active Child  ?albuterol (VENTOLIN HFA) 108 (90 Base) MCG/ACT inhaler [id number]  INHALE 1 TO 2 PUFFS BY MOUTH EVERY 6 HOURS AS NEEDED FOR WHEEZING AND FOR SHORTNESS OF BREATH Hawks, Christy A, FNP  Active   ?amLODipine (NORVASC) 5 MG tablet [id number]  Take 1 tablet by mouth once daily Sharion Balloon, FNP  Active   ?Ascorbic Acid (VITAMIN C) 1000 MG tablet [id number]  Take 1,000 mg by mouth 3 (three) times daily.  [provider]  Active Child  ?aspirin EC 81 MG tablet [id number]  Take 81 mg by mouth daily. In the morning [provider]  Active Child  ?         ?Med Note Laurin Coder, MARIAN A   Tue Mar 11, 2016 10:47 AM)    ?BD ULTRA-FINE PEN NEEDLES 29G X 12.7MM MISC [id number]  USE PENNEEDLES 3 TIMES  DAILY AS DIRECTED Evelina Dun A, FNP  Active Child  ?blood glucose meter kit and supplies [id number]   Dispense based on patient and insurance preference. Use up to four times daily as directed. (FOR ICD-10 E10.9, E11.9). Sharion Balloon, FNP  Active Child  ?Calcium Carbonate-Vit D-Min (CALTRATE 600+D PLUS MINERALS) 600-800 MG-UNIT TABS [id number]  Take 1 tablet by mouth 3 (three) times daily.  [provider]  Active Child  ?carvedilol (COREG) 6.25 MG  tablet [id number]  Take 1 tablet (6.25 mg total) by mouth 2 (two) times daily. Wellington Hampshire, MD  Active   ?Cholecalciferol (VITAMIN D-3) 125 MCG (5000 UT) TABS [id number]  Take 5,000 Units by mouth daily. [provider]  Active Child  ?clopidogrel (PLAVIX) 75 MG tablet [id number]  Take 1 tablet by mouth once daily Evelina Dun A, FNP  Active   ?fish oil-omega-3 fatty acids 1000 MG capsule [id number]  Take 1 g by mouth daily.  [provider]  Active Child  ?glucose blood (GLUCOSE METER TEST) test strip [id number]  Use 6-10 times a day, Uses ReliOn Hawks, Alyse Low A, FNP  Active Child  ?halobetasol (ULTRAVATE) 0.05 % cream [id number]  Apply 1 application topically daily as needed (For rash). [name], PA-C  Active   ?HUMALOG KWIKPEN 200 UNIT/ML KwikPen [id number]  INJECT 50 UNITS SUBCUTANEOUSLY THREE TIMES DAILY WITH MEALS Hawks, Theador Hawthorne, FNP  Active   ?         ?Med Note (Rosiland Sen D   Wed May 22, 2021 11:15 AM) 20-30 UNITS ?  ?hydroxypropyl methylcellulose (ISOPTO TEARS) 2.5 % ophthalmic solution [id number]  Place 1 drop into both eyes 3 (three) times daily as needed (for dry eyes). [provider]  Active Child  ?Insulin Syringe 27G X 1/2" 0.5 ML MISC [id number]  Use with Fiasp insulin TID Evelina Dun A, FNP  Active Child  ?lisinopril (ZESTRIL) 20 MG tablet [id number]  Take 1 tablet by mouth twice daily Evelina Dun A, FNP  Active   ?montelukast (SINGULAIR) 10 MG tablet [id number]  Take 1 tablet by mouth once daily with breakfast Evelina Dun A, FNP  Active   ?ondansetron (ZOFRAN ODT) 4 MG disintegrating tablet [id number]   Take 1 tablet (4 mg total) by mouth every 8 (eight) hours as needed for nausea or vomiting. Dettinger, Fransisca Kaufmann, MD  Active   ?RELION PEN NEEDLES 29G X 12MM MISC [id number]  USE 1 NEEDLE TO INJECT INSULIN SUBCUTAN

## 2021-06-06 NOTE — Patient Instructions (Signed)
Visit Information ? ?Following are the goals we discussed today:  ?Current Barriers:  ?Unable to independently afford treatment regimen ?Unable to achieve control of T2DM  ? ?Pharmacist Clinical Goal(s):  ?patient will verbalize ability to afford treatment regimen ?achieve control of T2DM as evidenced by GOAL A1C<7% through collaboration with PharmD and provider.  ? ?Interventions: ?1:1 collaboration with Sharion Balloon, FNP regarding development and update of comprehensive plan of care as evidenced by provider attestation and co-signature ?Inter-disciplinary care team collaboration (see longitudinal plan of care) ?Comprehensive medication review performed; medication list updated in electronic medical record ? ?Diabetes: Goal on Track (progressing): YES. ?Uncontrolled, A1c 8.2%--patient s/p open heart surgery last may 2022;  ?New regimen:  ?1. TRESIBA 15-20 UNITS NIGHTLY (LONG ACTING) ?2. FIASP (SHORT ACTING INSULIN) 20-30 UNITS WITH MEALS 3x DAILY ?3. OZEMPIC '1mg'$  WEEKLY;  ?Current glucose readings: fasting glucose: <170, 130s per patient, post prandial glucose: n/a ?Consider libre pending patient preference ?Denies hypoglycemic/hyperglycemic symptoms ?Current exercise: bike at home ?Educated on new basal insulin; new regimen; patient was only using rapid acting insulin--nervous to start basal/bolus--will go slow & titrate up.  Recommend heart healthy diet/plate method given [id number] issues ?Assessed patient finances. Enrolled in the [employer] patient assistance program ? ? ?Patient Goals/Self-Care Activities ?patient will:  ?- take medications as prescribed as evidenced by patient report and record review ?check glucose 4 times daily , document, and provide at future appointments ?collaborate with provider on medication access solutions ?target a minimum of 150 minutes of moderate intensity exercise weekly ?engage in dietary modifications by FOLLOWING A HEART HEALTHY DIET/HEALTHY PLATE  METHOD ? ? ? ?Plan: Next PCP appointment scheduled for:  4/25 ? ?Signature ?Regina Eck, PharmD, BCPS ?Clinical Pharmacist, [facility] ?[place]  [place] Phone [phone] ? ? ?Please call the care guide team at [phone] if you need to cancel or reschedule your appointment.  ? ?The patient verbalized understanding of instructions, educational materials, and care plan provided today and declined offer to receive copy of patient instructions, educational materials, and care plan.  ? ?

## 2021-06-10 ENCOUNTER — Other Ambulatory Visit: Payer: Self-pay | Admitting: Family

## 2021-06-10 DIAGNOSIS — E1159 Type 2 diabetes mellitus with other circulatory complications: Secondary | ICD-10-CM

## 2021-06-11 ENCOUNTER — Ambulatory Visit: Payer: Medicare Other | Admitting: Family

## 2021-06-12 ENCOUNTER — Encounter: Payer: Self-pay | Admitting: Family

## 2021-06-16 DIAGNOSIS — E1151 Type 2 diabetes mellitus with diabetic peripheral angiopathy without gangrene: Secondary | ICD-10-CM

## 2021-06-16 DIAGNOSIS — E1159 Type 2 diabetes mellitus with other circulatory complications: Secondary | ICD-10-CM

## 2021-06-16 DIAGNOSIS — I152 Hypertension secondary to endocrine disorders: Secondary | ICD-10-CM

## 2021-06-17 DIAGNOSIS — H26491 Other secondary cataract, right eye: Secondary | ICD-10-CM | POA: Diagnosis not present

## 2021-06-17 DIAGNOSIS — E113393 Type 2 diabetes mellitus with moderate nonproliferative diabetic retinopathy without macular edema, bilateral: Secondary | ICD-10-CM | POA: Diagnosis not present

## 2021-06-17 DIAGNOSIS — H25812 Combined forms of age-related cataract, left eye: Secondary | ICD-10-CM | POA: Diagnosis not present

## 2021-06-17 LAB — HM DIABETES EYE EXAM

## 2021-06-26 ENCOUNTER — Other Ambulatory Visit: Payer: Self-pay | Admitting: Family

## 2021-06-26 DIAGNOSIS — E785 Hyperlipidemia, unspecified: Secondary | ICD-10-CM

## 2021-07-08 NOTE — Progress Notes (Unsigned)
Cardiology Office Note   Date:  07/10/2021   ID:  Leah Ferrell [birth year], MRN [id number]  PCP:  Sharion Balloon, FNP  Cardiologist: Dr. Fletcher [name]   No chief complaint on file.    History of Present Illness: Leah Ferrell is a 71 y.o. female who presents for a followup visit regarding peripheral arterial disease and coronary artery disease.  She has known history of coronary artery disease status post drug-eluting stent placement to the LAD X 2 with subsequent CABG in May 2022. She is known to have peripheral arterial disease status post left common iliac artery stent placement followed by left common femoral artery resection and bypass from left external iliac to the left profunda done by Dr. Kellie Simmering in 2015. She is known to have chronically occluded bilateral SFAs being managed medically.  She had worsening left leg claudication in 2018 with elevated velocity at the proximal anastomosis of the graft.   Angiography in March 2018 showed mild to moderate calcified common iliac artery disease with patent stent in the left common iliac artery. There was severe stenosis in the left common femoral artery at the proximal anastomosis of the bypass to the profunda. I performed successful angioplasty and drug-coated balloon angioplasty.   She is status post bilateral renal artery stenting in January 2021 due to refractory hypertension and recurrent heart failure.  She had significant improvement in blood pressure control as well as heart failure symptoms since then.    Lower extremity angiography in March of 2022 showed patent renal artery stents with significant restenosis on the right side, severe bilateral heavily calcified ostial common iliac artery disease extending into the distal aorta.  The iliac arteries were treated by successful kissing stent placement.  On the right side, there was moderate common femoral artery stenosis as well as chronically occluded SFA with reconstitution  distally via collaterals from the profunda, short occlusion of above-the-knee popliteal artery with collaterals and three-vessel runoff below the knee.  On the left side, there was flush occlusion of the SFA with collaterals from the profunda and two-vessel runoff below the knee. She underwent CABG in May, 2022 with LIMA to LAD, SVG to OM and SVG to diagonal.  She has been doing reasonably well with no recent chest pain.  She reports stable exertional dyspnea.  She has minimal bilateral leg claudication but does not exercise much.  She underwent renal artery duplex today which showed evidence of significant restenosis on the right side and borderline on the left.  In spite of that, her blood pressure is reasonably controlled and her kidney function is normal.  In addition, she has no heart failure symptoms.   Past Medical History:  Diagnosis Date   CAD (coronary artery disease)    a. 04/2012 NSTEMI: s/p DES to LAD.   Chronic diastolic CHF (congestive heart failure) (HCC)    Diabetes mellitus without complication (HCC)    Dysrhythmia    Environmental and seasonal allergies    Hyperlipidemia    Hypertension    Leukocytosis    Morbid obesity (HCC)    NSTEMI (non-ST elevated myocardial infarction) ([place]) 04/27/2012   PONV (postoperative nausea and vomiting)    PVD (peripheral vascular disease) ([place])    a. 04/2012 ABI: R 0.49, L 0.39. Angiography 06/14: Significant ostial left common iliac artery stenosis extending into the distal aorta, severe left common femoral artery stenosis, bilateral SFA occlusion with heavy calcifications. Functionally, one-vessel runoff bilaterally below the knee   Renal  artery stenosis (HCC)    s/p angioplasty/stenting bilateral renal arteries 03/16/19   Shortness of breath     Past Surgical History:  Procedure Laterality Date   ABDOMINAL AORTAGRAM N/A 07/21/2012   Procedure: ABDOMINAL Maxcine Ham;  Surgeon: Wellington Hampshire, MD;  Location: [facility] CATH LAB;  Service:  Cardiovascular;  Laterality: N/A;   ABDOMINAL AORTOGRAM W/LOWER EXTREMITY N/A 05/09/2020   Procedure: ABDOMINAL AORTOGRAM W/LOWER EXTREMITY;  Surgeon: Wellington Hampshire, MD;  Location: [facility];  Service: Cardiovascular;  Laterality: N/A;   CARDIAC CATHETERIZATION     stents X 2   CORONARY ANGIOGRAM  04/27/2012   Procedure: CORONARY ANGIOGRAM;  Surgeon: Thayer Headings, MD;  Location: [facility] CATH LAB;  Service: Cardiovascular;;   CORONARY ARTERY BYPASS GRAFT N/A 06/25/2020   Procedure: CORONARY ARTERY BYPASS GRAFTING (CABG)X3. LEFT INTERNAL MAMMARY ARTERY. RIGHT ENDOSCOPIC SAPHENOUS VEIN HARVESTING.;  Surgeon: Wonda Olds, MD;  Location: [facility];  Service: Open Heart Surgery;  Laterality: N/A;   ENDARTERECTOMY FEMORAL Left 11/02/2013   Procedure: RESECTION LEFT COMMON FEMORAL ARTERY AND INSERTION OF INTERPOSITION 17m HEMASHIELD GRAFT FROM LEFT EXTERNAL  ILIAC ARTERY TO LEFT PROFUNDA  FEMORIS ARTERY;  Surgeon: JMal Misty MD;  [place] [facility]  Service: Vascular;  Laterality: Left;   EYE SURGERY Right 2017   FRACTURE SURGERY Right Jul 07, 2014   Right Foot-  [place] [facility]  by Heat duct   ILIAC ARTERY STENT Left 08/31/2013   LAD stent     LEFT HEART CATH AND CORONARY ANGIOGRAPHY N/A 05/09/2020   Procedure: LEFT HEART CATH AND CORONARY ANGIOGRAPHY;  Surgeon: AWellington Hampshire MD;  [facility];  Service: Cardiovascular;  Laterality: N/A;   LEFT HEART CATHETERIZATION WITH CORONARY ANGIOGRAM N/A 04/23/2012   Procedure: LEFT HEART CATHETERIZATION WITH CORONARY ANGIOGRAM;  Surgeon: Peter M JMartinique MD;  [facility];  Service: Cardiovascular;  Laterality: N/A;   LOWER EXTREMITY ANGIOGRAM Left 08/31/2013   Procedure: LOWER EXTREMITY ANGIOGRAM;  Surgeon: MWellington Hampshire MD;  [facility];  Service: Cardiovascular;  Laterality: Left;   PERCUTANEOUS STENT INTERVENTION Left 08/31/2013   Procedure: PERCUTANEOUS STENT INTERVENTION;  Surgeon: MWellington Hampshire MD;   [facility];  Service: Cardiovascular;  Laterality: Left;  Common Iliac artery   PERIPHERAL VASCULAR CATHETERIZATION N/A 03/19/2016   Procedure: Abdominal Aortogram w/Lower Extremity;  Surgeon: MWellington Hampshire MD;  [facility];  Service: Cardiovascular;  Laterality: N/A;   PERIPHERAL VASCULAR CATHETERIZATION  03/19/2016   Procedure: Peripheral Vascular Balloon Angioplasty-CFA Left;  Surgeon: MWellington Hampshire MD;  [facility];  Service: Cardiovascular;;   PERIPHERAL VASCULAR INTERVENTION Bilateral 03/16/2019   Procedure: PERIPHERAL VASCULAR INTERVENTION;  Surgeon: AWellington Hampshire MD;  [facility];  Service: Cardiovascular;  Laterality: Bilateral;  RENAL   PERIPHERAL VASCULAR INTERVENTION Bilateral 05/09/2020   Procedure: PERIPHERAL VASCULAR INTERVENTION;  Surgeon: AWellington Hampshire MD;  [facility];  Service: Cardiovascular;  Laterality: Bilateral;  Iliac   RENAL ANGIOGRAPHY  03/16/2019   RENAL ANGIOGRAPHY Bilateral 03/16/2019   Procedure: RENAL ANGIOGRAPHY;  Surgeon: AWellington Hampshire MD;  [facility];  Service: Cardiovascular;  Laterality: Bilateral;   TEE WITHOUT CARDIOVERSION N/A 06/25/2020   Procedure: TRANSESOPHAGEAL ECHOCARDIOGRAM (TEE);  Surgeon: AWonda Olds MD;  [place] [facility]  Service: Open Heart Surgery;  Laterality: N/A;   TUMOR REMOVAL     tumor removed  from [place]     Current Outpatient Medications  Medication Sig Dispense Refill   acetaminophen (TYLENOL) 325 MG tablet Take 2 tablets (650 mg total) by mouth every 6 (six) hours as needed for mild pain (or Fever >/= 101). 20 tablet 0   albuterol (VENTOLIN HFA) 108 (90 Base) MCG/ACT inhaler INHALE 1 TO 2 PUFFS BY MOUTH EVERY 6 HOURS AS NEEDED FOR WHEEZING AND FOR SHORTNESS OF BREATH 18 g 0   amLODipine (NORVASC) 5 MG tablet Take 1 tablet by mouth once daily 90 tablet 0   Ascorbic Acid (VITAMIN C) 1000 MG tablet Take 1,000 mg by  mouth 3 (three) times daily.      aspirin EC 81 MG tablet Take 81 mg by mouth daily. In the morning     BD ULTRA-FINE PEN NEEDLES 29G X 12.7MM MISC USE PENNEEDLES 3 TIMES  DAILY AS DIRECTED 270 each 1   blood glucose meter kit and supplies Dispense based on patient and insurance preference. Use up to four times daily as directed. (FOR ICD-10 E10.9, E11.9). 1 each 0   Calcium Carbonate-Vit D-Min (CALTRATE 600+D PLUS MINERALS) 600-800 MG-UNIT TABS Take 1 tablet by mouth 3 (three) times daily.      carvedilol (COREG) 6.25 MG tablet Take 1 tablet (6.25 mg total) by mouth 2 (two) times daily. 180 tablet 1   Cholecalciferol (VITAMIN D-3) 125 MCG (5000 UT) TABS Take 5,000 Units by mouth daily.     clopidogrel (PLAVIX) 75 MG tablet Take 1 tablet by mouth once daily 90 tablet 0   fish oil-omega-3 fatty acids 1000 MG capsule Take 1 g by mouth daily.      glucose blood (GLUCOSE METER TEST) test strip Use 6-10 times a day, Uses ReliOn 900 each 12   halobetasol (ULTRAVATE) 0.05 % cream Apply 1 application topically daily as needed (For rash).     HUMALOG KWIKPEN 200 UNIT/ML KwikPen INJECT 50 UNITS SUBCUTANEOUSLY THREE TIMES DAILY WITH MEALS 18 mL 0   hydroxypropyl methylcellulose (ISOPTO TEARS) 2.5 % ophthalmic solution Place 1 drop into both eyes 3 (three) times daily as needed (for dry eyes).     insulin degludec (TRESIBA FLEXTOUCH) 100 UNIT/ML FlexTouch Pen Inject 15-30 Units into the skin daily.     Insulin Syringe 27G X 1/2" 0.5 ML MISC Use with Fiasp insulin TID 100 each 3   lisinopril (ZESTRIL) 20 MG tablet Take 1 tablet by mouth twice daily 180 tablet 1   montelukast (SINGULAIR) 10 MG tablet Take 1 tablet by mouth once daily with breakfast 90 tablet 1   ondansetron (ZOFRAN ODT) 4 MG disintegrating tablet Take 1 tablet (4 mg total) by mouth every 8 (eight) hours as needed for nausea or vomiting. 20 tablet 0   RELION PEN NEEDLES 29G X 12MM MISC USE 1 NEEDLE TO INJECT INSULIN SUBCUTANEOUSLY 3 TIMES DAILY  (Patient taking differently: 1 Device by Subconjunctival route 3 (three) times daily.) 50 each 5   rosuvastatin (CRESTOR) 40 MG tablet Take 1 tablet by mouth once daily 90 tablet 0   Semaglutide,0.25 or 0.5MG/DOS, (OZEMPIC, 0.25 OR 0.5 MG/DOSE,) 2 MG/1.5ML SOPN Inject 1 mg into the skin once a week. Friday 3 mL 1   No current facility-administered medications for this visit.    Allergies:   Tape    Social History:  The patient  reports that she quit smoking about 20 years ago. Her smoking use included cigarettes. She started smoking about 57 years ago. She has a 36.00 pack-year smoking  history. She has never used smokeless tobacco. She reports that she does not drink alcohol and does not use drugs.   Family History:  The patient's family history includes Alcohol abuse in her brother; Diabetes in her brother, daughter, maternal grandmother, mother, and son; Heart attack in her maternal grandfather; Heart disease in her brother; Hyperlipidemia in her mother and son; Hypertension in her daughter, mother, and son; Other in her sister and sister; Peripheral vascular disease in her mother.    ROS:  Please see the history of present illness.   Otherwise, review of systems are positive for none.   All other systems are reviewed and negative.    PHYSICAL EXAM: VS:  BP (!) 148/50   Pulse 61   Ht 5' 2.5" (1.588 m)   Wt 214 lb (97.1 kg)   SpO2 93%   BMI 38.52 kg/m  , BMI Body mass index is 38.52 kg/m. GEN: Well nourished, well developed, in no acute distress  HEENT: normal  Neck: no JVD, carotid bruits, or masses Cardiac: RRR; no  rubs, or gallops , trace edema . There is 2/6 systolic ejection murmur in the aortic area.  Respiratory:  clear to auscultation bilaterally, normal work of breathing GI: soft, nontender, nondistended, + BS MS: no deformity or atrophy  Skin: warm and dry, no rash Neuro:  Strength and sensation are intact Psych: euthymic mood, full affect Distal pulses are not  palpable.    EKG:  EKG is  ordered today. EKG showed normal sinus rhythm with left atrial enlargement and lateral T wave changes suggestive of ischemia.   Recent Labs: 02/22/2021: ALT 20; BUN 13; Creatinine, Ser 0.68; Hemoglobin 14.4; Platelets 258; Potassium 4.7; Sodium 138    Lipid Panel    Component Value Date/Time   CHOL 171 02/22/2021 1103   TRIG 140 02/22/2021 1103   TRIG 135 09/16/2012 1322   HDL 69 02/22/2021 1103   HDL 60 09/16/2012 1322   CHOLHDL 2.5 02/22/2021 1103   CHOLHDL 3.2 05/06/2012 1713   VLDL 32 05/06/2012 1713   LDLCALC 78 02/22/2021 1103   LDLCALC 92 09/16/2012 1322      Wt Readings from Last 3 Encounters:  07/09/21 214 lb (97.1 kg)  02/22/21 203 lb 12.8 oz (92.4 kg)  01/08/21 206 lb 12.8 oz (93.8 kg)           View : No data to display.            ASSESSMENT AND PLAN:  1. Peripheral arterial disease: Status post  kissing stent placement to the common iliac arteries due to severe right calf claudication as well as nonhealing ulceration on the right small toe.  No recurrent ulceration.  Most recent Doppler studies in October showed stable ABI and patent iliac stents.  Repeat studies in October 2023.  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  She does have evidence of significant restenosis on the right side.  However, currently there is no clinical indication for intervention.  Continue clinical observation.  3. Coronary artery disease involving native coronary arteries without angina: Symptoms resolved after CABG and she is doing well.  .      4. Hyperlipidemia: Continue high-dose rosuvastatin.  I reviewed most recent lipid profile done in January which showed an LDL of 78.   Disposition:   FU with me in 6  month  Signed,  [name], MD  07/10/2021 9:33 AM    [place]

## 2021-07-09 ENCOUNTER — Ambulatory Visit (HOSPITAL_COMMUNITY)
Admission: RE | Admit: 2021-07-09 | Discharge: 2021-07-09 | Disposition: A | Discharge status: Home / Self Care | Payer: Medicare Other | Source: Ambulatory Visit | Attending: Cardiovascular Disease | Admitting: Cardiovascular Disease

## 2021-07-09 ENCOUNTER — Encounter: Payer: Self-pay | Admitting: Cardiovascular Disease

## 2021-07-09 ENCOUNTER — Ambulatory Visit (INDEPENDENT_AMBULATORY_CARE_PROVIDER_SITE_OTHER): Payer: Medicare Other | Admitting: Cardiovascular Disease

## 2021-07-09 VITALS — BP 148/50 | HR 61 | Ht 62.5 in | Wt 214.0 lb

## 2021-07-09 DIAGNOSIS — I150 Renovascular hypertension: Secondary | ICD-10-CM | POA: Diagnosis not present

## 2021-07-09 DIAGNOSIS — I739 Peripheral vascular disease, unspecified: Secondary | ICD-10-CM

## 2021-07-09 DIAGNOSIS — I2510 Atherosclerotic heart disease of native coronary artery without angina pectoris: Secondary | ICD-10-CM

## 2021-07-09 DIAGNOSIS — E785 Hyperlipidemia, unspecified: Secondary | ICD-10-CM | POA: Diagnosis not present

## 2021-07-09 NOTE — Patient Instructions (Signed)
Medication Instructions:  No changes *If you need a refill on your cardiac medications before your next appointment, please call your pharmacy*   Lab Work: None ordered If you have labs (blood work) drawn today and your tests are completely normal, you will receive your results only by: MyChart Message (if you have MyChart) OR A paper copy in the mail If you have any lab test that is abnormal or we need to change your treatment, we will call you to review the results.   Testing/Procedures: None ordered   Follow-Up: At [facility], you and your health needs are our priority.  As part of our continuing mission to provide you with exceptional heart care, we have created designated Provider Care Teams.  These Care Teams include your primary Cardiologist (physician) and Advanced Practice Providers (APPs -  Physician Assistants and Nurse Practitioners) who all work together to provide you with the care you need, when you need it.  We recommend signing up for the patient portal called "MyChart".  Sign up information is provided on this After Visit Summary.  MyChart is used to connect with patients for Virtual Visits (Telemedicine).  Patients are able to view lab/test results, encounter notes, upcoming appointments, etc.  Non-urgent messages can be sent to your provider as well.   To learn more about what you can do with MyChart, go to [email or web address].    Your next appointment:   6 month(s)  The format for your next appointment:   In Person  Provider:   Muhammad Arida, MD {   Important Information About Sugar       

## 2021-07-12 ENCOUNTER — Encounter: Payer: Self-pay | Admitting: Family

## 2021-07-12 ENCOUNTER — Ambulatory Visit (INDEPENDENT_AMBULATORY_CARE_PROVIDER_SITE_OTHER): Payer: Medicare Other | Admitting: Family

## 2021-07-12 VITALS — BP 136/63 | HR 65 | Temp 97.8°F | Ht 62.5 in | Wt 204.0 lb

## 2021-07-12 DIAGNOSIS — I739 Peripheral vascular disease, unspecified: Secondary | ICD-10-CM

## 2021-07-12 DIAGNOSIS — I5032 Chronic diastolic (congestive) heart failure: Secondary | ICD-10-CM | POA: Diagnosis not present

## 2021-07-12 DIAGNOSIS — Z951 Presence of aortocoronary bypass graft: Secondary | ICD-10-CM

## 2021-07-12 DIAGNOSIS — I252 Old myocardial infarction: Secondary | ICD-10-CM | POA: Diagnosis not present

## 2021-07-12 DIAGNOSIS — I2510 Atherosclerotic heart disease of native coronary artery without angina pectoris: Secondary | ICD-10-CM

## 2021-07-12 DIAGNOSIS — E1151 Type 2 diabetes mellitus with diabetic peripheral angiopathy without gangrene: Secondary | ICD-10-CM

## 2021-07-12 DIAGNOSIS — Z794 Long term (current) use of insulin: Secondary | ICD-10-CM

## 2021-07-12 DIAGNOSIS — E1142 Type 2 diabetes mellitus with diabetic polyneuropathy: Secondary | ICD-10-CM

## 2021-07-12 LAB — BAYER DCA HB A1C WAIVED: HB A1C (BAYER DCA - WAIVED): 7.6 % — ABNORMAL HIGH (ref 4.8–5.6)

## 2021-07-12 NOTE — Patient Instructions (Signed)

## 2021-07-12 NOTE — Progress Notes (Signed)
Subjective:    Patient ID: Leah Ferrell, female    DOB: 12-15-1950, 71 y.o.   MRN: [id number]  Chief Complaint  Patient presents with   Medical Management of Chronic Issues   Pt presents to the office today for chronic follow up. Pt has CHF, CAD and hx MI and is followed by Cardiologists every 6 months. PT is followed by Vascular  for PVD and PAD. PT states these are stable.    Pt had a renal angiography on 03/16/19. She reports her swelling is improved and her BP is improved.     She had critical lower limb ischemia and had abdominal aortogram with lower extremity. 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    Pt is morbid obese because her BMI is 36 and has DM and HTN.  Hypertension This is a chronic problem. The current episode started more than 1 year ago. The problem has been resolved since onset. The problem is controlled. Associated symptoms include malaise/fatigue and peripheral edema. Pertinent negatives include no blurred vision or shortness of breath. Risk factors for coronary artery disease include diabetes mellitus, dyslipidemia, obesity and sedentary lifestyle. The current treatment provides moderate improvement. Hypertensive end-organ damage includes heart failure. There is no history of CVA.  Diabetes She presents for her follow-up diabetic visit. She has type 2 diabetes mellitus. Associated symptoms include fatigue and foot paresthesias. Pertinent negatives for diabetes include no blurred vision. There are no hypoglycemic complications. Symptoms are stable. Pertinent negatives for diabetic complications include no CVA. Risk factors for coronary artery disease include dyslipidemia, diabetes mellitus, hypertension and sedentary lifestyle. She is following a generally unhealthy diet. Her overall blood glucose range is 130-140 mg/dl. Eye exam is current.  Congestive Heart Failure Presents for follow-up visit. Associated symptoms include edema and fatigue. Pertinent negatives include no  shortness of breath. The symptoms have been stable.     Review of Systems  Constitutional:  Positive for fatigue and malaise/fatigue.  Eyes:  Negative for blurred vision.  Respiratory:  Negative for shortness of breath.   All other systems reviewed and are negative.     Objective:   Physical Exam Vitals reviewed.  Constitutional:      General: She is not in acute distress.    Appearance: She is well-developed. She is obese.  HENT:     Head: Normocephalic and atraumatic.     Right Ear: Tympanic membrane normal.     Left Ear: Tympanic membrane normal.  Eyes:     Pupils: Pupils are equal, round, and reactive to light.  Neck:     Thyroid: No thyromegaly.  Cardiovascular:     Rate and Rhythm: Normal rate and regular rhythm.     Heart sounds: Normal heart sounds. No murmur heard. Pulmonary:     Effort: Pulmonary effort is normal. No respiratory distress.     Breath sounds: Normal breath sounds. No wheezing.  Abdominal:     General: Bowel sounds are normal. There is no distension.     Palpations: Abdomen is soft.     Tenderness: There is no abdominal tenderness.  Musculoskeletal:        General: No tenderness. Normal range of motion.     Cervical back: Normal range of motion and neck supple.     Right lower leg: Edema (trace, discoloration) present.  Skin:    General: Skin is warm and dry.  Neurological:     Mental Status: She is alert and oriented to person, place, and time.  Cranial Nerves: No cranial nerve deficit.     Deep Tendon Reflexes: Reflexes are normal and symmetric.  Psychiatric:        Behavior: Behavior normal.        Thought Content: Thought content normal.        Judgment: Judgment normal.      BP 136/63   Pulse 65   Temp 97.8 F (36.6 C)   Ht 5' 2.5" (1.588 m)   Wt 204 lb (92.5 kg)   SpO2 98%   BMI 36.72 kg/m      Assessment & Plan:  Leah Ferrell comes in today with chief complaint of Medical Management of Chronic  Issues   Diagnosis and orders addressed:  1. Type 2 diabetes mellitus with diabetic polyneuropathy, with long-term current use of insulin (HCC) - CMP14+EGFR - CBC with Differential/Platelet - Bayer DCA Hb A1c Waived - Microalbumin / creatinine urine ratio  2. Coronary artery disease involving native coronary artery of native heart without angina pectoris - CMP14+EGFR - CBC with Differential/Platelet  3. Chronic diastolic heart failure (HCC) - CMP14+EGFR - CBC with Differential/Platelet  4. Diabetes mellitus with peripheral vascular disease ([place]) - CMP14+EGFR - CBC with Differential/Platelet  5. PAD (peripheral artery disease) (HCC) - CMP14+EGFR - CBC with Differential/Platelet  6. S/P CABG x 3 - CMP14+EGFR - CBC with Differential/Platelet  7. Morbid obesity ([place] - CMP14+EGFR - CBC with Differential/Platelet  8. Hx of non-ST elevation myocardial infarction (NSTEMI) - CMP14+EGFR - CBC with Differential/Platelet   Labs pending Health Maintenance reviewed Diet and exercise encouraged  Follow up plan: 3 months    Evelina Dun, FNP

## 2021-07-13 LAB — CMP14+EGFR
ALT: 19 IU/L (ref 0–32)
AST: 20 IU/L (ref 0–40)
Albumin/Globulin Ratio: 1.2 (ref 1.2–2.2)
Albumin: 4.3 g/dL (ref 3.8–4.8)
Alkaline Phosphatase: 60 IU/L (ref 44–121)
BUN/Creatinine Ratio: 11 — ABNORMAL LOW (ref 12–28)
BUN: 9 mg/dL (ref 8–27)
Bilirubin Total: 0.2 mg/dL (ref 0.0–1.2)
CO2: 27 mmol/L (ref 20–29)
Calcium: 10 mg/dL (ref 8.7–10.3)
Chloride: 98 mmol/L (ref 96–106)
Creatinine, Ser: 0.81 mg/dL (ref 0.57–1.00)
Globulin, Total: 3.5 g/dL (ref 1.5–4.5)
Glucose: 116 mg/dL — ABNORMAL HIGH (ref 70–99)
Potassium: 4.1 mmol/L (ref 3.5–5.2)
Sodium: 140 mmol/L (ref 134–144)
Total Protein: 7.8 g/dL (ref 6.0–8.5)
eGFR: 78 mL/min/{1.73_m2} (ref >59)

## 2021-07-13 LAB — CBC WITH DIFFERENTIAL/PLATELET
Basophils Absolute: 0.1 10*3/uL (ref 0.0–0.2)
Basos: 1 %
EOS (ABSOLUTE): 0.2 10*3/uL (ref 0.0–0.4)
Eos: 2 %
Hematocrit: 44.6 % (ref 34.0–46.6)
Hemoglobin: 14.9 g/dL (ref 11.1–15.9)
Immature Grans (Abs): 0 10*3/uL (ref 0.0–0.1)
Immature Granulocytes: 0 %
Lymphocytes Absolute: 3 10*3/uL (ref 0.7–3.1)
Lymphs: 32 %
MCH: 31.2 pg (ref 26.6–33.0)
MCHC: 33.4 g/dL (ref 31.5–35.7)
MCV: 93 fL (ref 79–97)
Monocytes Absolute: 0.5 10*3/uL (ref 0.1–0.9)
Monocytes: 5 %
Neutrophils Absolute: 5.5 10*3/uL (ref 1.4–7.0)
Neutrophils: 60 %
Platelets: 270 10*3/uL (ref 150–450)
RBC: 4.78 x10E6/uL (ref 3.77–5.28)
RDW: 13.2 % (ref 11.7–15.4)
WBC: 9.3 10*3/uL (ref 3.4–10.8)

## 2021-07-15 LAB — MICROALBUMIN / CREATININE URINE RATIO
Creatinine, Urine: 228.3 mg/dL
Microalb/Creat Ratio: 143 mg/g creat — ABNORMAL HIGH (ref 0–29)
Microalbumin, Urine: 325.6 ug/mL

## 2021-07-16 ENCOUNTER — Other Ambulatory Visit: Payer: Self-pay | Admitting: Family

## 2021-07-16 DIAGNOSIS — E1159 Type 2 diabetes mellitus with other circulatory complications: Secondary | ICD-10-CM

## 2021-07-17 ENCOUNTER — Ambulatory Visit (INDEPENDENT_AMBULATORY_CARE_PROVIDER_SITE_OTHER): Payer: Medicare Other | Admitting: Pharmacist

## 2021-07-17 DIAGNOSIS — I152 Hypertension secondary to endocrine disorders: Secondary | ICD-10-CM

## 2021-07-17 DIAGNOSIS — E1142 Type 2 diabetes mellitus with diabetic polyneuropathy: Secondary | ICD-10-CM

## 2021-07-17 DIAGNOSIS — Z794 Long term (current) use of insulin: Secondary | ICD-10-CM

## 2021-07-17 DIAGNOSIS — E1159 Type 2 diabetes mellitus with other circulatory complications: Secondary | ICD-10-CM

## 2021-07-17 NOTE — Progress Notes (Signed)
Chronic Care Management Pharmacy Note  07/17/2021 Name:  Leah Ferrell MRN:  [id number] DOB:  10/14/1950  Summary:  Diabetes: Goal on Track (progressing): YES. Uncontrolled, A1c 8.2%-->7.6%--patient s/p open heart surgery last may 2022;  New regimen:  1. TRESIBA 15-20 UNITS NIGHTLY (LONG ACTING)--increase to 30 units nightly 2. FIASP (SHORT ACTING INSULIN) 20-30 UNITS WITH MEALS 3x DAILY 3. OZEMPIC 65m WEEKLY  Current glucose readings: fasting glucose: <170, 130s per patient, post prandial glucose: n/a Consider libre pending patient preference Denies hypoglycemic/hyperglycemic symptoms Current exercise: bike at home Educated on new basal insulin; new regimen; patient was only using rapid acting insulin--nervous to start basal/bolus--will go slow & titrate up.  Recommend heart healthy diet/plate method given [id number] Assessed patient finances. Enrolled in the novo nordisk patient assistance program; refills sent today via novo nordisk patient assistance program   Subjective: Leah BODINEis an 71y.o. year old female who is a primary patient of HSharion Balloon FNP.  The CCM team was consulted for assistance with disease management and care coordination needs.    Engaged with patient by telephone for follow up visit in response to provider referral for pharmacy case management and/or care coordination services.   Consent to Services:  The patient was given information about Chronic Care Management services, agreed to services, and gave verbal consent prior to initiation of services.  Please see initial visit note for detailed documentation.   Patient Care Team: HSharion Balloon FNP as PCP - General (Nurse Practitioner) AWellington Hampshire MD as PCP - Cardiology (Cardiology) AWellington Hampshire MD as Consulting Physician (Cardiology) [facility](Ph[facility]ist)  Objective:  Lab Results  Component Value Date   CREATININE 0.81 07/12/2021   CREATININE  0.68 02/22/2021   CREATININE 0.76 11/27/2020    Lab Results  Component Value Date   HGBA1C 7.6 (H) 07/12/2021   Last diabetic Eye exam:  Lab Results  Component Value Date/Time   HMDIABEYEEXA No Retinopathy 09/26/2020 12:00 AM    Last diabetic Foot exam: No results found for: HMDIABFOOTEX      Component Value Date/Time   CHOL 171 02/22/2021 1103   TRIG 140 02/22/2021 1103   TRIG 135 09/16/2012 1322   HDL 69 02/22/2021 1103   HDL 60 09/16/2012 1322   CHOLHDL 2.5 02/22/2021 1103   CHOLHDL 3.2 05/06/2012 1713   VLDL 32 05/06/2012 1713   LDLCALC 78 02/22/2021 1103   LDLCALC 92 09/16/2012 1322       Latest Ref Rng & Units 07/12/2021   12:08 PM 02/22/2021   11:03 AM 11/23/2020   11:30 AM  Hepatic Function  Total Protein 6.0 - 8.5 g/dL 7.8   7.5   7.6    Albumin 3.8 - 4.8 g/dL 4.3   4.2   4.3    AST 0 - 40 IU/L 20   14   18     ALT 0 - 32 IU/L 19   20   18     Alk Phosphatase 44 - 121 IU/L 60   68   76    Total Bilirubin 0.0 - 1.2 mg/dL 0.2   0.3   0.3      Lab Results  Component Value Date/Time   TSH 1.470 12/07/2018 12:24 PM   TSH 1.550 04/05/2014 01:16 PM       Latest Ref Rng & Units 07/12/2021   12:08 PM 02/22/2021   11:03 AM 11/23/2020   11:30 AM  CBC  WBC 3.4 -  10.8 x10E3/uL 9.3   11.3   10.0    Hemoglobin 11.1 - 15.9 g/dL 14.9   14.4   14.1    Hematocrit 34.0 - 46.6 % 44.6   44.1   44.5    Platelets 150 - 450 x10E3/uL 270   258   253      Lab Results  Component Value Date/Time   VD25OH 41.9 08/07/2014 12:23 PM   VD25OH 37.5 04/05/2014 01:16 PM    Clinical ASCVD: Yes  The ASCVD Risk score (Arnett DK, et al., 2019) failed to calculate for the following reasons:   The patient has a prior MI or stroke diagnosis    Other: (CHADS2VASc if Afib, PHQ9 if depression, MMRC or CAT for COPD, ACT, DEXA)  Social History   Tobacco Use  Smoking Status Former   Packs/day: 1.00   Years: 36.00   Pack years: 36.00   Types: Cigarettes   Start date: 04/21/1964   Quit  date: 02/17/2001   Years since quitting: 20.4  Smokeless Tobacco Never   BP Readings from Last 3 Encounters:  07/12/21 136/63  07/09/21 (!) 148/50  02/22/21 (!) 155/54   Pulse Readings from Last 3 Encounters:  07/12/21 65  07/09/21 61  02/22/21 65   Wt Readings from Last 3 Encounters:  07/12/21 204 lb (92.5 kg)  07/09/21 214 lb (97.1 kg)  02/22/21 203 lb 12.8 oz (92.4 kg)    Assessment: Review of patient past medical history, allergies, medications, health status, including review of consultants reports, laboratory and other test data, was performed as part of comprehensive evaluation and provision of chronic care management services.   SDOH:  (Social Determinants of Health) assessments and interventions performed:    CCM Care Plan  Allergies  Allergen Reactions   Tape Rash    Medications Reviewed Today     Reviewed by Lavera Guise, [facility] (Pharmacist) on 07/24/21 at [phone]  Med List Status: <None>   Medication Order Taking? Sig Documenting Provider Last Dose Status Informant  acetaminophen (TYLENOL) 325 MG tablet [id number] No Take 2 tablets (650 mg total) by mouth every 6 (six) hours as needed for mild pain (or Fever >/= 101). Roxan Hockey, MD Taking Active Child  albuterol (VENTOLIN HFA) 108 (90 Base) MCG/ACT inhaler [id number] No INHALE 1 TO 2 PUFFS BY MOUTH EVERY 6 HOURS AS NEEDED FOR WHEEZING AND FOR SHORTNESS OF BREATH Hawks, Christy A, FNP Taking Active   amLODipine (NORVASC) 5 MG tablet [id number] No Take 1 tablet by mouth once daily Evelina Dun A, FNP Taking Active   Ascorbic Acid (VITAMIN C) 1000 MG tablet [id number] No Take 1,000 mg by mouth 3 (three) times daily.  [provider] Taking Active Child  aspirin EC 81 MG tablet [id number] No Take 81 mg by mouth daily. In the morning [provider] Taking Active Child           Med Note Laurin Coder, [facility] A   Tue Mar 11, 2016 10:47 AM)    BD ULTRA-FINE PEN NEEDLES 29G X 12.7MM MISC [id number] No USE  PENNEEDLES 3 TIMES  DAILY AS DIRECTED Sharion Balloon, FNP Taking Active Child  blood glucose meter kit and supplies [id number] No Dispense based on patient and insurance preference. Use up to four times daily as directed. (FOR ICD-10 E10.9, E11.9). Sharion Balloon, FNP Taking Active Child  Calcium Carbonate-Vit D-Min (CALTRATE 600+D PLUS MINERALS) 600-800 MG-UNIT TABS [id number] No Take 1 tablet by mouth 3 (three) times daily.  [provider] Taking Active Child  carvedilol (COREG) 6.25 MG tablet [id number] No Take 1 tablet (6.25 mg total) by mouth 2 (two) times daily. Wellington Hampshire, MD Taking Active   Cholecalciferol (VITAMIN D-3) 125 MCG (5000 UT) TABS [id number] No Take 5,000 Units by mouth daily. [provider] Taking Active Child  clopidogrel (PLAVIX) 75 MG tablet [id number] No Take 1 tablet by mouth once daily Evelina Dun A, FNP Taking Active   fish oil-omega-3 fatty acids 1000 MG capsule [id number] No Take 1 g by mouth daily.  [provider] Taking Active Child  glucose blood (GLUCOSE METER TEST) test strip [id number] No Use 6-10 times a day, Uses ReliOn Hawks, Alyse Low A, FNP Taking Active Child  halobetasol (ULTRAVATE) 0.05 % cream [id number] No Apply 1 application topically daily as needed (For rash). [name], [place] Taking Active   HUMALOG KWIKPEN 200 UNIT/ML KwikPen [id number] No INJECT 50 UNITS SUBCUTANEOUSLY THREE TIMES DAILY WITH MEALS Hawks, Emery A, FNP Taking Active            Med Note [name], Royce Macadamia   Thu Jun 06, 2021  3:28 PM) Via novo nordisk patient assistance program    hydroxypropyl methylcellulose (ISOPTO TEARS) 2.5 % ophthalmic solution [id number] No Place 1 drop into both eyes 3 (three) times daily as needed (for dry eyes). [provider] Taking Active Child  insulin degludec (TRESIBA FLEXTOUCH) 100 UNIT/ML FlexTouch Pen [id number] No Inject 15-30 Units into the skin daily. [provider] Taking Active            Med  Note [name], Meron Bocchino D   Wed May 22, 2021 11:16 AM) VIA NOVO NORDISK PATIENT ASSISTANCE  Insulin Syringe 27G X 1/2" 0.5 ML MISC [id number] No Use with Fiasp insulin TID Sharion Balloon, FNP Taking Active Child  lisinopril (ZESTRIL) 20 MG tablet [id number]  Take 1 tablet by mouth twice daily Hawks, Christy A, FNP  Active   montelukast (SINGULAIR) 10 MG tablet [id number] No Take 1 tablet by mouth once daily with breakfast Evelina Dun A, FNP Taking Active   ondansetron (ZOFRAN ODT) 4 MG disintegrating tablet [id number] No Take 1 tablet (4 mg total) by mouth every 8 (eight) hours as needed for nausea or vomiting. Dettinger, Fransisca Kaufmann, MD Taking Active   RELION PEN NEEDLES 29G X 12MM MISC [id number] No USE 1 NEEDLE TO INJECT INSULIN SUBCUTANEOUSLY 3 TIMES DAILY  Patient taking differently: 1 Device by Subconjunctival route 3 (three) times daily.   Sharion Balloon, FNP Taking Active   rosuvastatin (CRESTOR) 40 MG tablet [id number] No Take 1 tablet by mouth once daily Evelina Dun A, FNP Taking Active   Semaglutide,0.25 or 0.5MG/DOS, (OZEMPIC, 0.25 OR 0.5 MG/DOSE,) 2 MG/1.5ML SOPN [id number] No Inject 1 mg into the skin once a week. 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   Hypertensive urgency 12/13/2018   Osteopenia 04/27/2018   Hypoglycemia 12/02/2017   Acute on chronic diastolic CHF (congestive heart failure) ([place]) 12/02/2017   Dizziness 12/01/2017   Morbid  obesity ([place]) 06/10/2017   PMB (postmenopausal bleeding) 11/12/2015   Hypokalemia 11/23/2014   Leg pain, left 07/06/2014   Vaginal atrophy 05/08/2014   Left leg swelling 01/17/2014   PAD (peripheral artery  disease) ([place]) 10/18/2013   Abnormal stress test 07/18/2013   PVD (peripheral vascular disease) ([place]) 07/12/2013   S/P primary angioplasty with coronary stent 07/12/2013   Diabetes mellitus ([place]) 07/12/2013   Chronic diastolic heart failure ([place]) 08/27/2012   Carotid bruit 08/27/2012   Angina pectoris ([place]) 04/30/2012   CAD (coronary artery disease) 04/28/2012   Hx of non-ST elevation myocardial infarction (NSTEMI) 04/23/2012   Hypertension associated with diabetes ([place])    Diabetes mellitus with peripheral vascular disease ([place])     Immunization History  Administered Date(s) Administered   Hepatitis B, adult 04/11/2017, 05/18/2017   Pneumococcal Conjugate-13 11/06/2016   Pneumococcal Polysaccharide-23 10/17/2015   Tdap 08/19/2011   Zoster, Live 09/26/2014    Conditions to be addressed/monitored: HTN, HLD, and DMII  Care Plan : PHARMD MEDICATION MANAGEMENT  Updates made by Lavera Guise, [place] since 07/24/2021 12:00 AM     Problem: DISEASE PROGRESSION PREVENTION      Long-Range Goal: T2DM PHARMD GOAL   Recent Progress: Not on track  Priority: High  Note:   Current Barriers:  Unable to independently afford treatment regimen Unable to achieve control of T2DM   Pharmacist Clinical Goal(s):  patient will verbalize ability to afford treatment regimen achieve control of T2DM as evidenced by GOAL A1C<7%  through collaboration with PharmD and provider.   Interventions: 1:1 collaboration with Sharion Balloon, FNP regarding development and update of comprehensive plan of care as evidenced by provider attestation and co-signature Inter-disciplinary care team collaboration (see longitudinal plan of care) Comprehensive medication review performed; medication list updated in electronic medical record  Diabetes: Goal on Track (progressing): YES. Uncontrolled, A1c 8.2%-->7.6%--patient s/p open heart surgery last may 2022;  New regimen:  1. TRESIBA 15-20 UNITS NIGHTLY (LONG  ACTING)--increase to 30 units nightly 2. FIASP (SHORT ACTING INSULIN) 20-30 UNITS WITH MEALS 3x DAILY 3. OZEMPIC 65m WEEKLY  Current glucose readings: fasting glucose: <170, 130s per patient, post prandial glucose: n/a Consider libre pending patient preference Denies hypoglycemic/hyperglycemic symptoms Current exercise: bike at home Educated on new basal insulin; new regimen; patient was only using rapid acting insulin--nervous to start basal/bolus--will go slow & titrate up.  Recommend heart healthy diet/plate method given [id number] Assessed patient finances. Enrolled in the novo nordisk patient assistance program; refills sent today via novo nordisk patient assistance program   Patient Goals/Self-Care Activities patient will:  - take medications as prescribed as evidenced by patient report and record review check glucose 4 times daily , document, and provide at future appointments collaborate with provider on medication access solutions target a minimum of 150 minutes of moderate intensity exercise weekly engage in dietary modifications by   FOLLOWING A HEART HEALTHY DIET/HEALTHY PLATE METHOD       Medication Assistance:  tresiba, fiasp, ozempic obtained through novo nordisk medication assistance program.  Enrollment ends 01/16/22  [place]  [place] [address]Phone: [phone]: [phone] [facility] ([address] [place]Phone: [phone]: [phone] Uses pill box? No - n/a Pt endorses 90% compliance  Follow Up:  Patient agrees to Care Plan  and Follow-up.  Plan: Telephone follow up appointment with care management team member scheduled for:  3 months    Regina Eck, PharmD, BCPS Clinical Pharmacist, [place]  II Phone [phone] ]

## 2021-07-17 NOTE — Patient Instructions (Addendum)
Visit Information  Following are the goals we discussed today:  Current Barriers:  Unable to independently afford treatment regimen Unable to achieve control of T2DM   Pharmacist Clinical Goal(s):  patient will verbalize ability to afford treatment regimen achieve control of T2DM as evidenced by GOAL A1C<7% through collaboration with PharmD and provider.   Interventions: 1:1 collaboration with Sharion Balloon, FNP regarding development and update of comprehensive plan of care as evidenced by provider attestation and co-signature Inter-disciplinary care team collaboration (see longitudinal plan of care) Comprehensive medication review performed; medication list updated in electronic medical record  Diabetes: Goal on Track (progressing): YES. Uncontrolled, A1c 8.2%-->7.6%--patient s/p open heart surgery last may 2022;  New regimen:  1. TRESIBA 15-20 UNITS NIGHTLY (LONG ACTING)--increase to 30 units nightly 2. FIASP (SHORT ACTING INSULIN) 20-30 UNITS WITH MEALS 3x DAILY 3. OZEMPIC '1mg'$  WEEKLY  Current glucose readings: fasting glucose: <170, 130s per patient, post prandial glucose: n/a Consider libre pending patient preference Denies hypoglycemic/hyperglycemic symptoms Current exercise: bike at home Educated on new basal insulin; new regimen; patient was only using rapid acting insulin--nervous to start basal/bolus--will go slow & titrate up.  Recommend heart healthy diet/plate method given [id number] issues Assessed patient finances. Enrolled in the novo nordisk patient assistance program; refills sent today via novo nordisk patient assistance program   Patient Goals/Self-Care Activities patient will:  - take medications as prescribed as evidenced by patient report and record review check glucose 4 times daily , document, and provide at future appointments collaborate with provider on medication access solutions target a minimum of 150 minutes of moderate intensity exercise  weekly engage in dietary modifications by FOLLOWING A HEART HEALTHY DIET/HEALTHY PLATE METHOD    Plan: Telephone follow up appointment with care management team member scheduled for:  3 months  Signature Regina Eck, PharmD, BCPS Clinical Pharmacist, [place]  II Phone [phone]   Please call the care guide team at [phone] if you need to cancel or reschedule your appointment.   The patient verbalized understanding of instructions, educational materials, and care plan provided today and DECLINED offer to receive copy of patient instructions, educational materials, and care plan.

## 2021-07-30 ENCOUNTER — Telehealth: Payer: Self-pay | Admitting: Family

## 2021-07-30 NOTE — Telephone Encounter (Signed)
Patient is in need of more ozempic samples. Please call to let her know if we have any.

## 2021-07-31 NOTE — Telephone Encounter (Signed)
Ozempic given  Fiasp patient assistance supply

## 2021-08-09 ENCOUNTER — Other Ambulatory Visit: Payer: Self-pay | Admitting: Family

## 2021-08-27 ENCOUNTER — Telehealth: Payer: Self-pay | Admitting: Family

## 2021-08-28 NOTE — Telephone Encounter (Signed)
Increase ozempic to '2mg'$  weekly Patient counseled Denies personal and family history of Medullary thyroid cancer (MTC) Dose change form submitted to novo nordisk PAP

## 2021-09-03 ENCOUNTER — Other Ambulatory Visit: Payer: Self-pay | Admitting: Family

## 2021-09-03 DIAGNOSIS — E1159 Type 2 diabetes mellitus with other circulatory complications: Secondary | ICD-10-CM

## 2021-09-19 ENCOUNTER — Other Ambulatory Visit: Payer: Self-pay | Admitting: Family

## 2021-09-19 DIAGNOSIS — E1169 Type 2 diabetes mellitus with other specified complication: Secondary | ICD-10-CM

## 2021-09-25 ENCOUNTER — Telehealth: Payer: Self-pay | Admitting: Family

## 2021-09-26 NOTE — Telephone Encounter (Signed)
Patient aware will pick up

## 2021-10-01 ENCOUNTER — Telehealth: Payer: Self-pay | Admitting: Family

## 2021-10-01 NOTE — Telephone Encounter (Signed)
Call returned  Instructed patient to take ozempic '2mg'$  weekly

## 2021-10-04 ENCOUNTER — Other Ambulatory Visit: Payer: Self-pay | Admitting: *Deleted

## 2021-10-04 NOTE — Patient Outreach (Signed)
  Care Coordination   10/04/2021 Name: Leah Ferrell MRN: [id number] DOB: 1951/02/07   Care Coordination Outreach Attempts:  Contact was made with the patient today to offer care coordination services as a benefit of their health plan. Patient declined services. Follow Up Plan:  No further outreach attempts will be made at this time.   Encounter Outcome:  Pt. Visit Completed  Care Coordination Interventions Activated:  No   Care Coordination Interventions:  No, not indicated    Emelia Loron RN, BSN [place] [phone] [email or web address]

## 2021-10-09 ENCOUNTER — Telehealth: Payer: Self-pay

## 2021-10-09 NOTE — Telephone Encounter (Signed)
Patient assistance received- ozempic   Patient aware- medication placed in fridge

## 2021-10-10 ENCOUNTER — Other Ambulatory Visit: Payer: Self-pay | Admitting: Family

## 2021-10-10 ENCOUNTER — Other Ambulatory Visit: Payer: Self-pay | Admitting: Cardiovascular Disease

## 2021-10-10 DIAGNOSIS — E1159 Type 2 diabetes mellitus with other circulatory complications: Secondary | ICD-10-CM

## 2021-10-14 ENCOUNTER — Other Ambulatory Visit: Payer: Self-pay | Admitting: Family

## 2021-10-14 ENCOUNTER — Encounter: Payer: Self-pay | Admitting: Family

## 2021-10-14 ENCOUNTER — Ambulatory Visit (INDEPENDENT_AMBULATORY_CARE_PROVIDER_SITE_OTHER): Payer: Medicare Other | Admitting: Family

## 2021-10-14 VITALS — BP 168/58 | HR 68 | Temp 97.0°F | Ht 62.5 in | Wt 204.0 lb

## 2021-10-14 DIAGNOSIS — Z780 Asymptomatic menopausal state: Secondary | ICD-10-CM

## 2021-10-14 DIAGNOSIS — I252 Old myocardial infarction: Secondary | ICD-10-CM

## 2021-10-14 DIAGNOSIS — E1142 Type 2 diabetes mellitus with diabetic polyneuropathy: Secondary | ICD-10-CM

## 2021-10-14 DIAGNOSIS — Z23 Encounter for immunization: Secondary | ICD-10-CM

## 2021-10-14 DIAGNOSIS — I5032 Chronic diastolic (congestive) heart failure: Secondary | ICD-10-CM | POA: Diagnosis not present

## 2021-10-14 DIAGNOSIS — I739 Peripheral vascular disease, unspecified: Secondary | ICD-10-CM | POA: Diagnosis not present

## 2021-10-14 DIAGNOSIS — I2510 Atherosclerotic heart disease of native coronary artery without angina pectoris: Secondary | ICD-10-CM

## 2021-10-14 DIAGNOSIS — I152 Hypertension secondary to endocrine disorders: Secondary | ICD-10-CM

## 2021-10-14 DIAGNOSIS — E1151 Type 2 diabetes mellitus with diabetic peripheral angiopathy without gangrene: Secondary | ICD-10-CM

## 2021-10-14 DIAGNOSIS — Z794 Long term (current) use of insulin: Secondary | ICD-10-CM

## 2021-10-14 DIAGNOSIS — Z951 Presence of aortocoronary bypass graft: Secondary | ICD-10-CM

## 2021-10-14 DIAGNOSIS — E1159 Type 2 diabetes mellitus with other circulatory complications: Secondary | ICD-10-CM | POA: Diagnosis not present

## 2021-10-14 LAB — BAYER DCA HB A1C WAIVED: HB A1C (BAYER DCA - WAIVED): 7.3 % — ABNORMAL HIGH (ref 4.8–5.6)

## 2021-10-14 NOTE — Patient Instructions (Signed)
Health Maintenance After Age 70 After age 70, you are at a higher risk for certain long-term diseases and infections as well as injuries from falls. Falls are a major cause of broken bones and head injuries in people who are older than age 70. Getting regular preventive care can help to keep you healthy and well. Preventive care includes getting regular testing and making lifestyle changes as recommended by your health care provider. Talk with your health care provider about: Which screenings and tests you should have. A screening is a test that checks for a disease when you have no symptoms. A diet and exercise plan that is right for you. What should I know about screenings and tests to prevent falls? Screening and testing are the best ways to find a health problem early. Early diagnosis and treatment give you the best chance of managing medical conditions that are common after age 70. Certain conditions and lifestyle choices may make you more likely to have a fall. Your health care provider may recommend: Regular vision checks. Poor vision and conditions such as cataracts can make you more likely to have a fall. If you wear glasses, make sure to get your prescription updated if your vision changes. Medicine review. Work with your health care provider to regularly review all of the medicines you are taking, including over-the-counter medicines. Ask your health care provider about any side effects that may make you more likely to have a fall. Tell your health care provider if any medicines that you take make you feel dizzy or sleepy. Strength and balance checks. Your health care provider may recommend certain tests to check your strength and balance while standing, walking, or changing positions. Foot health exam. Foot pain and numbness, as well as not wearing proper footwear, can make you more likely to have a fall. Screenings, including: Osteoporosis screening. Osteoporosis is a condition that causes  the bones to get weaker and break more easily. Blood pressure screening. Blood pressure changes and medicines to control blood pressure can make you feel dizzy. Depression screening. You may be more likely to have a fall if you have a fear of falling, feel depressed, or feel unable to do activities that you used to do. Alcohol use screening. Using too much alcohol can affect your balance and may make you more likely to have a fall. Follow these instructions at home: Lifestyle Do not drink alcohol if: Your health care provider tells you not to drink. If you drink alcohol: Limit how much you have to: 0-1 drink a day for women. 0-2 drinks a day for men. Know how much alcohol is in your drink. In the [place] one drink equals one 12 oz bottle of beer (355 mL), one 5 oz glass of wine (148 mL), or one 1 oz glass of hard liquor (44 mL). Do not use any products that contain nicotine or tobacco. These products include cigarettes, chewing tobacco, and vaping devices, such as e-cigarettes. If you need help quitting, ask your health care provider. Activity  Follow a regular exercise program to stay fit. This will help you maintain your balance. Ask your health care provider what types of exercise are appropriate for you. If you need a cane or walker, use it as recommended by your health care provider. Wear supportive shoes that have nonskid soles. Safety  Remove any tripping hazards, such as rugs, cords, and clutter. Install safety equipment such as grab bars in bathrooms and safety rails on stairs. Keep rooms and walkways   well-lit. General instructions Talk with your health care provider about your risks for falling. Tell your health care provider if: You fall. Be sure to tell your health care provider about all falls, even ones that seem minor. You feel dizzy, tiredness (fatigue), or off-balance. Take over-the-counter and prescription medicines only as told by your health care provider. These include  supplements. Eat a healthy diet and maintain a healthy weight. A healthy diet includes low-fat dairy products, low-fat (lean) meats, and fiber from whole grains, beans, and lots of fruits and vegetables. Stay current with your vaccines. Schedule regular health, dental, and eye exams. Summary Having a healthy lifestyle and getting preventive care can help to protect your health and wellness after age 70. Screening and testing are the best way to find a health problem early and help you avoid having a fall. Early diagnosis and treatment give you the best chance for managing medical conditions that are more common for people who are older than age 70. Falls are a major cause of broken bones and head injuries in people who are older than age 70. Take precautions to prevent a fall at home. Work with your health care provider to learn what changes you can make to improve your health and wellness and to prevent falls. This information is not intended to replace advice given to you by your health care provider. Make sure you discuss any questions you have with your health care provider. Document Revised: 06/25/2020 Document Reviewed: 06/25/2020 Elsevier Patient Education  2023 [employer]  

## 2021-10-14 NOTE — Progress Notes (Signed)
Subjective:    Patient ID: Leah Ferrell, female    DOB: 1950/11/12, 71 y.o.   MRN: [id number]  Chief Complaint  Patient presents with   Medical Management of Chronic Issues   Pt presents to the office today for chronic follow up. Pt has CHF, CAD and hx MI and is followed by Cardiologists every 6 months. PT is followed by Vascular  for PVD and PAD. PT states these are stable.    Pt had a renal angiography on 03/16/19. She reports her swelling is improved and her BP is improved.     She had critical lower limb ischemia and had abdominal aortogram with lower extremity. 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    Pt is morbid obese because her BMI is 36 and has DM and HTN.  Diabetes She presents for her follow-up diabetic visit. She has type 2 diabetes mellitus. Associated symptoms include blurred vision, fatigue and foot paresthesias. There are no hypoglycemic complications. Symptoms are stable. Diabetic complications include heart disease, nephropathy and peripheral neuropathy. Risk factors for coronary artery disease include dyslipidemia, diabetes mellitus, hypertension, sedentary lifestyle and post-menopausal. She is following a generally unhealthy diet. Her overall blood glucose range is 140-180 mg/dl.  Hypertension This is a chronic problem. The current episode started more than 1 year ago. The problem has been waxing and waning since onset. The problem is uncontrolled. Associated symptoms include blurred vision, malaise/fatigue and peripheral edema. Pertinent negatives include no shortness of breath. Risk factors for coronary artery disease include dyslipidemia, obesity and sedentary lifestyle. The current treatment provides moderate improvement.  Hyperlipidemia This is a chronic problem. The current episode started more than 1 year ago. The problem is controlled. Recent lipid tests were reviewed and are normal. Exacerbating diseases include obesity. Pertinent negatives include no shortness of  breath. Current antihyperlipidemic treatment includes statins. The current treatment provides moderate improvement of lipids. Risk factors for coronary artery disease include dyslipidemia, diabetes mellitus, hypertension, a sedentary lifestyle and post-menopausal.  Congestive Heart Failure Presents for follow-up visit. Associated symptoms include edema and fatigue. Pertinent negatives include no shortness of breath. The symptoms have been stable.      Review of Systems  Constitutional:  Positive for fatigue and malaise/fatigue.  Eyes:  Positive for blurred vision.  Respiratory:  Negative for shortness of breath.   All other systems reviewed and are negative.      Objective:   Physical Exam Vitals reviewed.  Constitutional:      General: She is not in acute distress.    Appearance: She is well-developed. She is obese.  HENT:     Head: Normocephalic and atraumatic.     Right Ear: Tympanic membrane normal.     Left Ear: Tympanic membrane normal.  Eyes:     Pupils: Pupils are equal, round, and reactive to light.  Neck:     Thyroid: No thyromegaly.  Cardiovascular:     Rate and Rhythm: Normal rate and regular rhythm.     Heart sounds: Normal heart sounds. No murmur heard. Pulmonary:     Effort: Pulmonary effort is normal. No respiratory distress.     Breath sounds: Normal breath sounds. No wheezing.  Abdominal:     General: Bowel sounds are normal. There is no distension.     Palpations: Abdomen is soft.     Tenderness: There is no abdominal tenderness.  Musculoskeletal:        General: No tenderness. Normal range of motion.     Cervical  back: Normal range of motion and neck supple.     Right lower leg: Edema (trace) present.     Left lower leg: Edema (trace) present.  Skin:    General: Skin is warm and dry.  Neurological:     Mental Status: She is alert and oriented to person, place, and time.     Cranial Nerves: No cranial nerve deficit.     Deep Tendon Reflexes:  Reflexes are normal and symmetric.  Psychiatric:        Behavior: Behavior normal.        Thought Content: Thought content normal.        Judgment: Judgment normal.    Diabetic Foot Exam - Simple   Simple Foot Form Diabetic Foot exam was performed with the following findings: Yes 10/14/2021 11:43 AM  Visual Inspection No deformities, no ulcerations, no other skin breakdown bilaterally: Yes Sensation Testing Intact to touch and monofilament testing bilaterally: Yes Pulse Check Posterior Tibialis and Dorsalis pulse intact bilaterally: Yes Comments Toenails thick       BP (!) 168/58   Pulse 68   Temp (!) 97 F (36.1 C) (Temporal)   Ht 5' 2.5" (1.588 m)   Wt 204 lb (92.5 kg)   BMI 36.72 kg/m      Assessment & Plan:  Leah Ferrell comes in today with chief complaint of Medical Management of Chronic Issues   Diagnosis and orders addressed:  1. Diabetes mellitus with peripheral vascular disease ([place]) - CMP14+EGFR - CBC with Differential/Platelet  2. Coronary artery disease involving native coronary artery of native heart without angina pectoris - CMP14+EGFR - CBC with Differential/Platelet  3. PAD (peripheral artery disease) (HCC) - CMP14+EGFR - CBC with Differential/Platelet  4. Chronic diastolic heart failure (HCC) - CMP14+EGFR - CBC with Differential/Platelet  5. PVD (peripheral vascular disease) (HCC) - CMP14+EGFR - CBC with Differential/Platelet  6. Hypertension associated with diabetes ([place]) - CMP14+EGFR - CBC with Differential/Platelet  7. Type 2 diabetes mellitus with diabetic polyneuropathy, with long-term current use of insulin (HCC) - Bayer DCA Hb A1c Waived - CMP14+EGFR - CBC with Differential/Platelet  8. Morbid obesity ([place]) - CMP14+EGFR - CBC with Differential/Platelet  9. S/P CABG x 3  - CMP14+EGFR - CBC with Differential/Platelet  10. Hx of non-ST elevation myocardial infarction (NSTEMI) - CMP14+EGFR - CBC with  Differential/Platelet  11. Post-menopausal - CMP14+EGFR - CBC with Differential/Platelet - DG [facility] DEXA   Labs pending Health Maintenance reviewed Diet and exercise encouraged  Follow up plan: 3 months    Evelina Dun, FNP

## 2021-10-15 ENCOUNTER — Other Ambulatory Visit: Payer: Medicare Other

## 2021-10-15 ENCOUNTER — Telehealth: Payer: Medicare Other

## 2021-10-15 ENCOUNTER — Telehealth: Payer: Self-pay | Admitting: Family

## 2021-10-15 LAB — CMP14+EGFR
ALT: 25 IU/L (ref 0–32)
AST: 24 IU/L (ref 0–40)
Albumin/Globulin Ratio: 1.3 (ref 1.2–2.2)
Albumin: 4.2 g/dL (ref 3.9–4.9)
Alkaline Phosphatase: 63 IU/L (ref 44–121)
BUN/Creatinine Ratio: 11 — ABNORMAL LOW (ref 12–28)
BUN: 8 mg/dL (ref 8–27)
Bilirubin Total: 0.3 mg/dL (ref 0.0–1.2)
CO2: 26 mmol/L (ref 20–29)
Calcium: 10.1 mg/dL (ref 8.7–10.3)
Chloride: 99 mmol/L (ref 96–106)
Creatinine, Ser: 0.72 mg/dL (ref 0.57–1.00)
Globulin, Total: 3.3 g/dL (ref 1.5–4.5)
Glucose: 120 mg/dL — ABNORMAL HIGH (ref 70–99)
Potassium: 4.4 mmol/L (ref 3.5–5.2)
Sodium: 141 mmol/L (ref 134–144)
Total Protein: 7.5 g/dL (ref 6.0–8.5)
eGFR: 90 mL/min/{1.73_m2} (ref >59)

## 2021-10-15 LAB — CBC WITH DIFFERENTIAL/PLATELET
Basophils Absolute: 0 10*3/uL (ref 0.0–0.2)
Basos: 0 %
EOS (ABSOLUTE): 0.2 10*3/uL (ref 0.0–0.4)
Eos: 2 %
Hematocrit: 44.6 % (ref 34.0–46.6)
Hemoglobin: 14.4 g/dL (ref 11.1–15.9)
Immature Grans (Abs): 0 10*3/uL (ref 0.0–0.1)
Immature Granulocytes: 0 %
Lymphocytes Absolute: 2.5 10*3/uL (ref 0.7–3.1)
Lymphs: 24 %
MCH: 30.9 pg (ref 26.6–33.0)
MCHC: 32.3 g/dL (ref 31.5–35.7)
MCV: 96 fL (ref 79–97)
Monocytes Absolute: 0.7 10*3/uL (ref 0.1–0.9)
Monocytes: 7 %
Neutrophils Absolute: 7 10*3/uL (ref 1.4–7.0)
Neutrophils: 67 %
Platelets: 275 10*3/uL (ref 150–450)
RBC: 4.66 x10E6/uL (ref 3.77–5.28)
RDW: 13.5 % (ref 11.7–15.4)
WBC: 10.6 10*3/uL (ref 3.4–10.8)

## 2021-10-16 NOTE — Telephone Encounter (Signed)
Spoke to patient - she decided to wait until after daughter's surgery as she has to help her

## 2021-10-17 ENCOUNTER — Ambulatory Visit (INDEPENDENT_AMBULATORY_CARE_PROVIDER_SITE_OTHER): Payer: Medicare Other | Admitting: Pharmacist

## 2021-10-17 ENCOUNTER — Other Ambulatory Visit: Payer: Medicare Other

## 2021-10-17 ENCOUNTER — Telehealth: Payer: Medicare Other

## 2021-10-17 DIAGNOSIS — I2510 Atherosclerotic heart disease of native coronary artery without angina pectoris: Secondary | ICD-10-CM

## 2021-10-17 DIAGNOSIS — E1151 Type 2 diabetes mellitus with diabetic peripheral angiopathy without gangrene: Secondary | ICD-10-CM

## 2021-10-17 NOTE — Patient Instructions (Signed)
Visit Information  Following are the goals we discussed today:  Current Barriers:  Unable to independently afford treatment regimen Unable to achieve control of T2DM   Pharmacist Clinical Goal(s):  patient will verbalize ability to afford treatment regimen achieve control of T2DM as evidenced by GOAL A1C<7% through collaboration with PharmD and provider.   Interventions: 1:1 collaboration with Sharion Balloon, FNP regarding development and update of comprehensive plan of care as evidenced by provider attestation and co-signature Inter-disciplinary care team collaboration (see longitudinal plan of care) Comprehensive medication review performed; medication list updated in electronic medical record  Diabetes: Goal on Track (progressing): YES. Uncontrolled, A1c 8.2%-->7.6%-->7.3%--patient s/p open heart surgery last may 2022;  New regimen:  1. TRESIBA decrease 10-15 units NIGHTLY (LONG ACTING) 2. FIASP decrease (SHORT ACTING INSULIN) to 10-15 UNITS WITH MEALS 3x DAILY 3. OZEMPIC '2mg'$  WEEKLY  Denies personal and family history of Medullary thyroid cancer (MTC) Having some hypoglycemia early AM (decrease basal and meal times due to increase in ozempic '2mg'$  weekly) Current glucose readings: fasting glucose: <170, 130s per patient, post prandial glucose: n/a Consider libre pending patient preference REPORTS hypoglycemia-adjusted insulin as above Current exercise: bike at home Educated on new basal insulin; new regimen; patient was only using rapid acting insulin--nervous to start basal/bolus--will go slow & titrate up.  Recommend heart healthy diet/plate method given [id number] issues Assessed patient finances. Enrolled in the novo nordisk patient assistance program for tresiba, fiasp, ozempic   Patient Goals/Self-Care Activities patient will:  - take medications as prescribed as evidenced by patient report and record review check glucose 4 times daily , document, and provide at  future appointments collaborate with provider on medication access solutions target a minimum of 150 minutes of moderate intensity exercise weekly engage in dietary modifications by FOLLOWING A HEART HEALTHY DIET/HEALTHY PLATE METHOD    Plan: Telephone follow up appointment with care management team member scheduled for:  3 months  Signature Regina Eck, PharmD, BCPS Clinical Pharmacist, [place]  II Phone [phone]   Please call the care guide team at [phone] if you need to cancel or reschedule your appointment.   The patient verbalized understanding of instructions, educational materials, and care plan provided today and DECLINED offer to receive copy of patient instructions, educational materials, and care plan.

## 2021-10-17 NOTE — Progress Notes (Signed)
Chronic Care Management Pharmacy Note 10/17/2021 Name:  Leah Ferrell MRN:  [id number] DOB:  [birth year]  Summary: Diabetes: Goal on Track (progressing): YES. Uncontrolled, A1c 8.2%-->7.6%-->7.3%--patient s/p open heart surgery last may 2022;  New regimen:  1. TRESIBA decrease 10-15 units NIGHTLY (LONG ACTING) 2. FIASP decrease (SHORT ACTING INSULIN) to 10-15 UNITS WITH MEALS 3x DAILY 3. OZEMPIC 73m WEEKLY  Denies personal and family history of Medullary thyroid cancer (MTC) Having some hypoglycemia early AM (decrease basal and meal times due to increase in ozempic 277mweekly) Current glucose readings: fasting glucose: <170, 130s per patient, post prandial glucose: n/a Consider libre pending patient preference REPORTS hypoglycemia-adjusted insulin as above Current exercise: bike at home Educated on new basal insulin; new regimen; patient was only using rapid acting insulin--nervous to start basal/bolus--will go slow & titrate up.  Recommend heart healthy diet/plate method given [id number] Assessed patient finances. Enrolled in the novo nordisk patient assistance program for tresiba, fiasp, ozempic  Subjective: Leah Ferrell an [age].o. year old female who is a primary patient of HaSharion BalloonFNP.  The CCM team was consulted for assistance with disease management and care coordination needs.    Engaged with patient by telephone for follow up visit in response to provider referral for pharmacy case management and/or care coordination services.   Consent to Services:  The patient was given information about Chronic Care Management services, agreed to services, and gave verbal consent prior to initiation of services.  Please see initial visit note for detailed documentation.   Patient Care Team: HaSharion BalloonFNP as PCP - General (Nurse Practitioner) ArWellington HampshireMD as PCP - Cardiology (Cardiology) ArWellington HampshireMD as Consulting Physician  (Cardiology) PrLavera GuiseRPEncompass Health Deaconess Hospital IncPharmacist)  Objective:  Lab Results  Component Value Date   CREATININE 0.72 10/14/2021   CREATININE 0.81 07/12/2021   CREATININE 0.68 02/22/2021    Lab Results  Component Value Date   HGBA1C 7.3 (H) 10/14/2021   Last diabetic Eye exam:  Lab Results  Component Value Date/Time   HMDIABEYEEXA No Retinopathy 09/26/2020 12:00 AM    Last diabetic Foot exam: No results found for: "HMDIABFOOTEX"      Component Value Date/Time   CHOL 171 02/22/2021 1103   TRIG 140 02/22/2021 1103   TRIG 135 09/16/2012 1322   HDL 69 02/22/2021 1103   HDL 60 09/16/2012 1322   CHOLHDL 2.5 02/22/2021 1103   CHOLHDL 3.2 05/06/2012 1713   VLDL 32 05/06/2012 1713   LDLCALC 78 02/22/2021 1103   LDLCALC 92 09/16/2012 1322       Latest Ref Rng & Units 10/14/2021    1:04 PM 07/12/2021   12:08 PM 02/22/2021   11:03 AM  Hepatic Function  Total Protein 6.0 - 8.5 g/dL 7.5  7.8  7.5   Albumin 3.9 - 4.9 g/dL 4.2  4.3  4.2   AST 0 - 40 IU/L 24  20  14    ALT 0 - 32 IU/L 25  19  20    Alk Phosphatase 44 - 121 IU/L 63  60  68   Total Bilirubin 0.0 - 1.2 mg/dL 0.3  0.2  0.3     Lab Results  Component Value Date/Time   TSH 1.470 12/07/2018 12:24 PM   TSH 1.550 04/05/2014 01:16 PM       Latest Ref Rng & Units 10/14/2021    1:04 PM 07/12/2021   12:08 PM 02/22/2021   11:03  AM  CBC  WBC 3.4 - 10.8 x10E3/uL 10.6  9.3  11.3   Hemoglobin 11.1 - 15.9 g/dL 14.4  14.9  14.4   Hematocrit 34.0 - 46.6 % 44.6  44.6  44.1   Platelets 150 - 450 x10E3/uL 275  270  258     Lab Results  Component Value Date/Time   VD25OH 41.9 08/07/2014 12:23 PM   VD25OH 37.5 04/05/2014 01:16 PM    Clinical ASCVD: Yes  The ASCVD Risk score (Arnett DK, et al., 2019) failed to calculate for the following reasons:   The patient has a prior MI or stroke diagnosis    Other: (CHADS2VASc if Afib, PHQ9 if depression, MMRC or CAT for COPD, ACT, DEXA)  Social History   Tobacco Use  Smoking Status  Former   Packs/day: 1.00   Years: 36.00   Total pack years: 36.00   Types: Cigarettes   Start date: 04/21/1964   Quit date: 02/17/2001   Years since quitting: 20.7  Smokeless Tobacco Never   BP Readings from Last 3 Encounters:  10/14/21 (!) 168/58  07/12/21 136/63  07/09/21 (!) 148/50   Pulse Readings from Last 3 Encounters:  10/14/21 68  07/12/21 65  07/09/21 61   Wt Readings from Last 3 Encounters:  10/14/21 204 lb (92.5 kg)  07/12/21 204 lb (92.5 kg)  07/09/21 214 lb (97.1 kg)    Assessment: Review of patient past medical history, allergies, medications, health status, including review of consultants reports, laboratory and other test data, was performed as part of comprehensive evaluation and provision of chronic care management services.   SDOH:  (Social Determinants of Health) assessments and interventions performed:  SDOH Interventions    Flowsheet Row Clinical Support from 11/23/2020 in [place] Interventions   Food Insecurity Interventions Intervention Not Indicated  Housing Interventions Intervention Not Indicated  Transportation Interventions Intervention Not Indicated  Financial Strain Interventions Intervention Not Indicated  Physical Activity Interventions Intervention Not Indicated  Stress Interventions Intervention Not Indicated  Social Connections Interventions Intervention Not Indicated       CCM Care Plan  Allergies  Allergen Reactions   Tape Rash    Medications Reviewed Today     Reviewed by Lavera Guise, [facility] (Pharmacist) on 11/05/21 at 5  Med List Status: <None>   Medication Order Taking? Sig Documenting Provider Last Dose Status Informant  acetaminophen (TYLENOL) 325 MG tablet [id number]  Take 2 tablets (650 mg total) by mouth every 6 (six) hours as needed for mild pain (or Fever >/= 101). Roxan Hockey, MD  Active Child  albuterol (VENTOLIN HFA) 108 (90 Base) MCG/ACT inhaler [id number]  INHALE 1 TO 2  PUFFS BY MOUTH EVERY 6 HOURS AS NEEDED FOR WHEEZING AND FOR SHORTNESS OF BREATH Hawks, Christy A, FNP  Active   amLODipine (NORVASC) 5 MG tablet [id number]  Take 1 tablet by mouth once daily Evelina Dun A, FNP  Active   Ascorbic Acid (VITAMIN C) 1000 MG tablet [id number]  Take 1,000 mg by mouth 3 (three) times daily.  [provider]  Active Child  aspirin EC 81 MG tablet [id number]  Take 81 mg by mouth daily. In the morning [provider]  Active Child           Med Note Laurin Coder, [facility] A   Tue Mar 11, 2016 10:47 AM)    BD ULTRA-FINE PEN NEEDLES 29G X 12.7MM MISC [id number]  USE PENNEEDLES 3 TIMES  DAILY AS DIRECTED Evelina Dun  A, FNP  Active Child  blood glucose meter kit and supplies [id number]  Dispense based on patient and insurance preference. Use up to four times daily as directed. (FOR ICD-10 E10.9, E11.9). Sharion Balloon, FNP  Active Child  Calcium Carbonate-Vit D-Min (CALTRATE 600+D PLUS MINERALS) 600-800 MG-UNIT TABS [id number]  Take 1 tablet by mouth 3 (three) times daily.  [provider]  Active Child  carvedilol (COREG) 6.25 MG tablet [id number]  Take 1 tablet by mouth twice daily [place], MD  Active   Cholecalciferol (VITAMIN D-3) 125 MCG (5000 UT) TABS [id number]  Take 5,000 Units by mouth daily. [provider]  Active Child  clopidogrel (PLAVIX) 75 MG tablet [id number]  Take 1 tablet by mouth once daily Evelina Dun A, FNP  Active   fish oil-omega-3 fatty acids 1000 MG capsule [id number]  Take 1 g by mouth daily.  [provider]  Active Child  glucose blood (GLUCOSE METER TEST) test strip [id number]  Use 6-10 times a day, Uses ReliOn Hawks, Christy A, FNP  Active Child  halobetasol (ULTRAVATE) 0.05 % cream [id number]  Apply 1 application topically daily as needed (For rash). Odis Luster  Active   HUMALOG KWIKPEN 200 UNIT/ML KwikPen [id number]  INJECT 50 UNITS SUBCUTANEOUSLY THREE TIMES DAILY WITH MEALS  Patient taking  differently: 10-20 Units with breakfast, with lunch, and with evening meal.   Sharion Balloon, FNP  Active            Med Note (Rache Klimaszewski D   Thu Jun 06, 2021  3:28 PM) Via novo nordisk patient assistance program    hydroxypropyl methylcellulose (ISOPTO TEARS) 2.5 % ophthalmic solution [id number]  Place 1 drop into both eyes 3 (three) times daily as needed (for dry eyes). [provider]  Active Child  insulin degludec (TRESIBA FLEXTOUCH) 100 UNIT/ML FlexTouch Pen [id number]  Inject 15-30 Units into the skin daily. [provider]  Active            Med Note [name], Jia Mohamed D   Wed May 22, 2021 11:16 AM) VIA [employer] PATIENT ASSISTANCE  Insulin Syringe 27G X 1/2" 0.5 ML MISC [id number]  Use with Fiasp insulin TID Evelina Dun A, FNP  Active Child  lisinopril (ZESTRIL) 20 MG tablet [id number]  Take 1 tablet by mouth twice daily Hawks, Christy A, FNP  Active   montelukast (SINGULAIR) 10 MG tablet [id number]  Take 1 tablet by mouth once daily with breakfast Evelina Dun A, FNP  Active   ondansetron (ZOFRAN ODT) 4 MG disintegrating tablet [id number]  Take 1 tablet (4 mg total) by mouth every 8 (eight) hours as needed for nausea or vomiting. Dettinger, Fransisca Kaufmann, MD  Active   RELION PEN NEEDLES 29G X 12MM MISC [id number]  USE 1 NEEDLE TO INJECT INSULIN SUBCUTANEOUSLY 3 TIMES DAILY  Patient taking differently: 1 Device by Subconjunctival route 3 (three) times daily.   Evelina Dun A, FNP  Active   rosuvastatin (CRESTOR) 40 MG tablet [id number]  Take 1 tablet by mouth once daily Hawks, Christy A, FNP  Active   Semaglutide, 2 MG/DOSE, (OZEMPIC, 2 MG/DOSE,) 8 MG/3ML SOPN [id number] Yes Inject 2 mg into the skin once a week. 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   Osteopenia 04/27/2018   Acute on chronic diastolic CHF (congestive heart failure) ([place]) 12/02/2017   Dizziness 12/01/2017   Morbid obesity ([place]) 06/10/2017   PMB (postmenopausal bleeding) 11/12/2015   Hypokalemia 11/23/2014   Leg pain, left 07/06/2014   Vaginal atrophy 05/08/2014   Left leg swelling 01/17/2014   PAD (peripheral artery disease) ([place]) 10/18/2013   Abnormal stress test 07/18/2013   PVD (peripheral vascular disease) ([place]) 07/12/2013   S/P primary angioplasty with coronary stent 07/12/2013   Diabetes mellitus ([place]) 07/12/2013   Chronic diastolic heart failure ([place]) 08/27/2012   Carotid bruit 08/27/2012   Angina pectoris ([place]) 04/30/2012   CAD (coronary artery disease) 04/28/2012   Hx of non-ST elevation myocardial infarction (NSTEMI) 04/23/2012   Hypertension associated with diabetes ([place])    Diabetes mellitus with peripheral vascular disease ([place])     Immunization History  Administered Date(s) Administered   Hepatitis B, adult 04/11/2017, 05/18/2017   Pneumococcal Conjugate-13 11/06/2016   Pneumococcal Polysaccharide-23 10/17/2015   Tdap 08/19/2011, 10/14/2021   Zoster, Live 09/26/2014    Conditions to be addressed/monitored: CAD and DMII  Care Plan : PHARMD MEDICATION MANAGEMENT  Updates made by Lavera Guise, [place] since 11/05/2021 12:00 AM     Problem: DISEASE PROGRESSION PREVENTION      Long-Range Goal: T2DM PHARMD GOAL   Recent Progress: Not on track  Priority: High  Note:   Current Barriers:  Unable to independently afford treatment regimen Unable to achieve control of T2DM   Pharmacist Clinical Goal(s):  patient will verbalize ability to afford treatment regimen achieve control of T2DM as evidenced by GOAL A1C<7%  through collaboration with PharmD and provider.   Interventions: 1:1 collaboration with Sharion Balloon, FNP  regarding development and update of comprehensive plan of care as evidenced by provider attestation and co-signature Inter-disciplinary care team collaboration (see longitudinal plan of care) Comprehensive medication review performed; medication list updated in electronic medical record  Diabetes: Goal on Track (progressing): YES. Uncontrolled, A1c 8.2%-->7.6%-->7.3%--patient s/p open heart surgery last may 2022;  New regimen:  1. TRESIBA decrease 10-15 units NIGHTLY (LONG ACTING) 2. FIASP decrease (SHORT ACTING INSULIN) to 10-15 UNITS WITH MEALS 3x DAILY 3. OZEMPIC 54m WEEKLY  Denies personal and family history of Medullary thyroid cancer (MTC) Having some hypoglycemia early AM (decrease basal and meal times due to increase in ozempic 282mweekly) Current glucose readings: fasting glucose: <170, 130s per patient, post prandial glucose: n/a Consider libre pending patient preference REPORTS hypoglycemia-adjusted insulin as above Current exercise: bike at home Educated on new basal insulin; new regimen; patient was only using rapid acting insulin--nervous to start basal/bolus--will go slow & titrate up.  Recommend heart healthy diet/plate method given [id number] Assessed patient finances. Enrolled in the novo n[employer] patient assistance program for tresiba, fiasp, ozempic   Patient Goals/Self-Care Activities patient will:  - take medications as prescribed as evidenced by patient report and record review check glucose 4 times daily , document, and provide at future appointments collaborate with provider on medication access solutions target a minimum of 150 minutes of moderate intensity exercise weekly engage in dietary modifications by   FOLLOWING A HEART HEALTHY DIET/HEALTHY PLATE METHOD       Medication Assistance:  tresiba, fiasp, ozempic obtained through tbd medication  assistance program.  Enrollment ends 2023 December  Follow Up:  Patient agrees to Care Plan and  Follow-up.  Plan: Telephone follow up appointment with care management team member scheduled for:  3 months    Regina Eck, PharmD, BCPS Clinical Pharmacist, [place]  II Phone [phone]

## 2021-10-29 ENCOUNTER — Telehealth: Payer: Self-pay

## 2021-10-29 NOTE — Telephone Encounter (Signed)
8 Flasp Flex Pens arrived from patient assistance - called and notified patient they are ready to pick up

## 2021-11-05 ENCOUNTER — Other Ambulatory Visit: Payer: Self-pay | Admitting: Family

## 2021-11-11 ENCOUNTER — Other Ambulatory Visit: Payer: Self-pay | Admitting: Family

## 2021-12-06 ENCOUNTER — Other Ambulatory Visit: Payer: Self-pay | Admitting: Family

## 2021-12-06 DIAGNOSIS — E1159 Type 2 diabetes mellitus with other circulatory complications: Secondary | ICD-10-CM

## 2021-12-16 ENCOUNTER — Ambulatory Visit (HOSPITAL_BASED_OUTPATIENT_CLINIC_OR_DEPARTMENT_OTHER)
Admission: RE | Admit: 2021-12-16 | Discharge: 2021-12-16 | Disposition: A | Discharge status: Home / Self Care | Payer: Medicare Other | Source: Ambulatory Visit | Attending: Cardiovascular Disease | Admitting: Cardiovascular Disease

## 2021-12-16 ENCOUNTER — Ambulatory Visit (HOSPITAL_COMMUNITY)
Admission: RE | Admit: 2021-12-16 | Discharge: 2021-12-16 | Disposition: A | Discharge status: Home / Self Care | Payer: Medicare Other | Source: Ambulatory Visit | Attending: Internal Medicine | Admitting: Internal Medicine

## 2021-12-16 DIAGNOSIS — I739 Peripheral vascular disease, unspecified: Secondary | ICD-10-CM | POA: Insufficient documentation

## 2021-12-16 DIAGNOSIS — Z95828 Presence of other vascular implants and grafts: Secondary | ICD-10-CM | POA: Insufficient documentation

## 2021-12-17 ENCOUNTER — Ambulatory Visit: Payer: Medicare Other | Attending: Cardiovascular Disease | Admitting: Cardiovascular Disease

## 2021-12-17 ENCOUNTER — Encounter: Payer: Self-pay | Admitting: Cardiovascular Disease

## 2021-12-17 VITALS — BP 144/88 | HR 68 | Ht 62.0 in | Wt 202.4 lb

## 2021-12-17 DIAGNOSIS — I2510 Atherosclerotic heart disease of native coronary artery without angina pectoris: Secondary | ICD-10-CM

## 2021-12-17 DIAGNOSIS — I150 Renovascular hypertension: Secondary | ICD-10-CM

## 2021-12-17 DIAGNOSIS — E785 Hyperlipidemia, unspecified: Secondary | ICD-10-CM

## 2021-12-17 DIAGNOSIS — I739 Peripheral vascular disease, unspecified: Secondary | ICD-10-CM

## 2021-12-17 NOTE — Progress Notes (Signed)
Cardiology Office Note   Date:  12/17/2021   ID:  Leah Ferrell, Leah Ferrell 12-25-1950, MRN [id number]  PCP:  Sharion Balloon, FNP  Cardiologist: Dr. Fletcher [name]   No chief complaint on file.    History of Present Illness: Leah Ferrell is a 71 y.o. female who presents for a followup visit regarding peripheral arterial disease and coronary artery disease.  She has known history of coronary artery disease status post drug-eluting stent placement to the LAD X 2 with subsequent CABG in May 2022. She is known to have peripheral arterial disease status post left common iliac artery stent placement followed by left common femoral artery resection and bypass from left external iliac to the left profunda done by Dr. Kellie Simmering in 2015. She is known to have chronically occluded bilateral SFAs being managed medically.  She had worsening left leg claudication in 2018 with elevated velocity at the proximal anastomosis of the graft.   Angiography in March 2018 showed mild to moderate calcified common iliac artery disease with patent stent in the left common iliac artery. There was severe stenosis in the left common femoral artery at the proximal anastomosis of the bypass to the profunda. I performed successful angioplasty and drug-coated balloon angioplasty.   She is status post bilateral renal artery stenting in January 2021 due to refractory hypertension and recurrent heart failure.  She had significant improvement in blood pressure control as well as heart failure symptoms since then.    Lower extremity angiography in March of 2022 showed patent renal artery stents with significant restenosis on the right side, severe bilateral heavily calcified ostial common iliac artery disease extending into the distal aorta.  The iliac arteries were treated by successful kissing stent placement.  On the right side, there was moderate common femoral artery stenosis as well as chronically occluded SFA with reconstitution  distally via collaterals from the profunda, short occlusion of above-the-knee popliteal artery with collaterals and three-vessel runoff below the knee.  On the left side, there was flush occlusion of the SFA with collaterals from the profunda and two-vessel runoff below the knee. She underwent CABG in May, 2022 with LIMA to LAD, SVG to OM and SVG to diagonal.  She has been doing reasonably well with no recent chest pain.  She reports stable exertional dyspnea.  She does report worsening bilateral calf claudication and burning sensation in her feet.  However, she has no rest pain or lower extremity ulceration.  She underwent repeat vascular studies yesterday which showed a decrease in ABI to the 0.4 range bilaterally.  Her ABIs were in the 0.5 range before.  Duplex was overall limited due to heavy calcifications of the iliac arteries.  She was noted to have moderately elevated velocities in the external iliac arteries.   Past Medical History:  Diagnosis Date   CAD (coronary artery disease)    a. 04/2012 NSTEMI: s/p DES to LAD.   Chronic diastolic CHF (congestive heart failure) (HCC)    Diabetes mellitus without complication (HCC)    Dysrhythmia    Environmental and seasonal allergies    Hyperlipidemia    Hypertension    Leukocytosis    Morbid obesity (HCC)    NSTEMI (non-ST elevated myocardial infarction) ([place]) 04/27/2012   PONV (postoperative nausea and vomiting)    PVD (peripheral vascular disease) ([place])    a. 04/2012 ABI: R 0.49, L 0.39. Angiography 06/14: Significant ostial left common iliac artery stenosis extending into the distal aorta, severe left common  femoral artery stenosis, bilateral SFA occlusion with heavy calcifications. Functionally, one-vessel runoff bilaterally below the knee   Renal artery stenosis (HCC)    s/p angioplasty/stenting bilateral renal arteries 03/16/19   Shortness of breath     Past Surgical History:  Procedure Laterality Date   ABDOMINAL AORTAGRAM N/A  07/21/2012   Procedure: ABDOMINAL Maxcine Ham;  Surgeon: Wellington Hampshire, MD;  Location: [place] CATH LAB;  Service: Cardiovascular;  Laterality: N/A;   ABDOMINAL AORTOGRAM W/LOWER EXTREMITY N/A 05/09/2020   Procedure: ABDOMINAL AORTOGRAM W/LOWER EXTREMITY;  Surgeon: Wellington Hampshire, MD;  Location: [place] CV LAB;  Service: Cardiovascular;  Laterality: N/A;   CARDIAC CATHETERIZATION     stents X 2   CORONARY ANGIOGRAM  04/27/2012   Procedure: CORONARY ANGIOGRAM;  Surgeon: Thayer Headings, MD;  Location: [facility] CATH LAB;  Service: Cardiovascular;;   CORONARY ARTERY BYPASS GRAFT N/A 06/25/2020   Procedure: CORONARY ARTERY BYPASS GRAFTING (CABG)X3. LEFT INTERNAL MAMMARY ARTERY. RIGHT ENDOSCOPIC SAPHENOUS VEIN HARVESTING.;  Surgeon: Wonda Olds, MD;  Location: [place];  Service: Open Heart Surgery;  Laterality: N/A;   ENDARTERECTOMY FEMORAL Left 11/02/2013   Procedure: RESECTION LEFT COMMON FEMORAL ARTERY AND INSERTION OF INTERPOSITION 64m HEMASHIELD GRAFT FROM LEFT EXTERNAL  ILIAC ARTERY TO LEFT PROFUNDA  FEMORIS ARTERY;  Surgeon: JMal Misty MD;  [place] [facility]  Service: Vascular;  Laterality: Left;   EYE SURGERY Right 2017   FRACTURE SURGERY Right Jul 07, 2014   Right [place]-  [place]  [facility]  by Heat duct   ILIAC ARTERY STENT Left 08/31/2013   LAD stent     LEFT HEART CATH AND CORONARY ANGIOGRAPHY N/A 05/09/2020   Procedure: LEFT HEART CATH AND CORONARY ANGIOGRAPHY;  Surgeon: AWellington Hampshire MD;  [facility];  Service: Cardiovascular;  Laterality: N/A;   LEFT HEART CATHETERIZATION WITH CORONARY ANGIOGRAM N/A 04/23/2012   Procedure: LEFT HEART CATHETERIZATION WITH CORONARY ANGIOGRAM;  Surgeon: Peter M JMartinique MD;  [facility];  Service: Cardiovascular;  Laterality: N/A;   LOWER EXTREMITY ANGIOGRAM Left 08/31/2013   Procedure: LOWER EXTREMITY ANGIOGRAM;  Surgeon: MWellington Hampshire MD;  [facility];  Service: Cardiovascular;  Laterality: Left;   PERCUTANEOUS  STENT INTERVENTION Left 08/31/2013   Procedure: PERCUTANEOUS STENT INTERVENTION;  Surgeon: MWellington Hampshire MD;  [facility];  Service: Cardiovascular;  Laterality: Left;  Common Iliac artery   PERIPHERAL VASCULAR CATHETERIZATION N/A 03/19/2016   Procedure: Abdominal Aortogram w/Lower Extremity;  Surgeon: MWellington Hampshire MD;  [facility];  Service: Cardiovascular;  Laterality: N/A;   PERIPHERAL VASCULAR CATHETERIZATION  03/19/2016   Procedure: Peripheral Vascular Balloon Angioplasty-CFA Left;  Surgeon: MWellington Hampshire MD;  [facility];  Service: Cardiovascular;;   PERIPHERAL VASCULAR INTERVENTION Bilateral 03/16/2019   Procedure: PERIPHERAL VASCULAR INTERVENTION;  Surgeon: AWellington Hampshire MD;  [place] [facility];  Service: Cardiovascular;  Laterality: Bilateral;  RENAL   PERIPHERAL VASCULAR INTERVENTION Bilateral 05/09/2020   Procedure: PERIPHERAL VASCULAR INTERVENTION;  Surgeon: AWellington Hampshire MD;  [facility];  Service: Cardiovascular;  Laterality: Bilateral;  Iliac   RENAL ANGIOGRAPHY  03/16/2019   RENAL ANGIOGRAPHY Bilateral 03/16/2019   Procedure: RENAL ANGIOGRAPHY;  Surgeon: AWellington Hampshire MD;  [facility];  Service: Cardiovascular;  Laterality: Bilateral;   TEE WITHOUT CARDIOVERSION N/A 06/25/2020   Procedure: TRANSESOPHAGEAL ECHOCARDIOGRAM (TEE);  Surgeon: AWonda Olds MD;  [facility];  Service: Open Heart Surgery;  Laterality: N/A;   TUMOR REMOVAL     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 20 tablet 0   albuterol (VENTOLIN HFA) 108 (90 Base) MCG/ACT inhaler INHALE 1 TO 2 PUFFS BY MOUTH EVERY 6 HOURS AS NEEDED FOR WHEEZING AND FOR SHORTNESS OF BREATH 18 g 0   amLODipine (NORVASC) 5 MG  tablet Take 1 tablet by mouth once daily 90 tablet 0   Ascorbic Acid (VITAMIN C) 1000 MG tablet Take 1,000 mg by mouth 3 (three) times daily.      aspirin EC 81 MG tablet Take 81 mg by mouth daily. In the morning     BD ULTRA-FINE PEN NEEDLES 29G X 12.7MM MISC USE PENNEEDLES 3 TIMES  DAILY AS DIRECTED 270 each 1   blood glucose meter kit and supplies Dispense based on patient and insurance preference. Use up to four times daily as directed. (FOR ICD-10 E10.9, E11.9). 1 each 0   Calcium Carbonate-Vit D-Min (CALTRATE 600+D PLUS MINERALS) 600-800 MG-UNIT TABS Take 1 tablet by mouth 3 (three) times daily.      carvedilol (COREG) 6.25 MG tablet Take 1 tablet by mouth twice daily 180 tablet 1   Cholecalciferol (VITAMIN D-3) 125 MCG (5000 UT) TABS Take 5,000 Units by mouth daily.     clopidogrel (PLAVIX) 75 MG tablet Take 1 tablet by mouth once daily 90 tablet 0   fish oil-omega-3 fatty acids 1000 MG capsule Take 1 g by mouth daily.      glucose blood (GLUCOSE METER TEST) test strip Use 6-10 times a day, Uses ReliOn 900 each 12   halobetasol (ULTRAVATE) 0.05 % cream Apply 1 application topically daily as needed (For rash).     HUMALOG KWIKPEN 200 UNIT/ML KwikPen INJECT 50 UNITS SUBCUTANEOUSLY THREE TIMES DAILY WITH MEALS (Patient taking differently: 10-20 Units with breakfast, with lunch, and with evening meal.) 18 mL 0   hydroxypropyl methylcellulose (ISOPTO TEARS) 2.5 % ophthalmic solution Place 1 drop into both eyes 3 (three) times daily as needed (for dry eyes).     insulin degludec (TRESIBA FLEXTOUCH) 100 UNIT/ML FlexTouch Pen Inject 15-30 Units into the skin daily.     Insulin Syringe 27G X 1/2" 0.5 ML MISC Use with Fiasp insulin TID 100 each 3   lisinopril (ZESTRIL) 20 MG tablet Take 1 tablet by mouth twice daily 180 tablet 0   montelukast (SINGULAIR) 10 MG tablet Take 1 tablet by mouth once daily with breakfast 90 tablet 1   ondansetron (ZOFRAN ODT) 4 MG disintegrating tablet Take 1 tablet (4 mg  total) by mouth every 8 (eight) hours as needed for nausea or vomiting. 20 tablet 0   RELION PEN NEEDLES 29G X 12MM MISC USE 1 NEEDLE TO INJECT INSULIN SUBCUTANEOUSLY 3 TIMES DAILY (Patient taking differently: 1 Device by Subconjunctival route 3 (three) times daily.) 50 each 5   rosuvastatin (CRESTOR) 40 MG tablet Take 1 tablet by mouth once daily 90 tablet 0   Semaglutide, 2 MG/DOSE, (OZEMPIC, 2 MG/DOSE,) 8 MG/3ML SOPN Inject 2 mg into the skin once a week.     No current facility-administered medications for this visit.    Allergies:   Tape    Social History:  The patient  reports that she quit  smoking about 20 years ago. Her smoking use included cigarettes. She started smoking about 57 years ago. She has a 36.00 pack-year smoking history. She has never used smokeless tobacco. She reports that she does not drink alcohol and does not use drugs.   Family History:  The patient's family history includes Alcohol abuse in her brother; Diabetes in her brother, daughter, maternal grandmother, mother, and son; Heart attack in her maternal grandfather; Heart disease in her brother; Hyperlipidemia in her mother and son; Hypertension in her daughter, mother, and son; Other in her sister and sister; Peripheral vascular disease in her mother.    ROS:  Please see the history of present illness.   Otherwise, review of systems are positive for none.   All other systems are reviewed and negative.    PHYSICAL EXAM: VS:  BP (!) 144/88 (BP Location: Left Arm, Patient Position: Sitting, Cuff Size: Large)   Pulse 68   Ht _0  (1.575 m)   Wt 202 lb 6.4 oz (91.8 kg)   SpO2 97%   BMI 37.02 kg/m  , BMI Body mass index is 37.02 kg/m. GEN: Well nourished, well developed, in no acute distress  HEENT: normal  Neck: no JVD, carotid bruits, or masses Cardiac: RRR; no  rubs, or gallops , trace edema . There is 2/6 systolic ejection murmur in the aortic area.  Respiratory:  clear to auscultation bilaterally, normal  work of breathing GI: soft, nontender, nondistended, + BS MS: no deformity or atrophy  Skin: warm and dry, no rash Neuro:  Strength and sensation are intact Psych: euthymic mood, full affect Distal pulses are not palpable.    EKG:  EKG is not  ordered today.    Recent Labs: 10/14/2021: ALT 25; BUN 8; Creatinine, Ser 0.72; Hemoglobin 14.4; Platelets 275; Potassium 4.4; Sodium 141    Lipid Panel    Component Value Date/Time   CHOL 171 02/22/2021 1103   TRIG 140 02/22/2021 1103   TRIG 135 09/16/2012 1322   HDL 69 02/22/2021 1103   HDL 60 09/16/2012 1322   CHOLHDL 2.5 02/22/2021 1103   CHOLHDL 3.2 05/06/2012 1713   VLDL 32 05/06/2012 1713   LDLCALC 78 02/22/2021 1103   LDLCALC 92 09/16/2012 1322      Wt Readings from Last 3 Encounters:  12/17/21 202 lb 6.4 oz (91.8 kg)  10/14/21 204 lb (92.5 kg)  07/12/21 204 lb (92.5 kg)           No data to display            ASSESSMENT AND PLAN:  1. Peripheral arterial disease: Status post  kissing stent placement to the common iliac arteries due to severe right calf claudication as well as nonhealing ulceration on the right small toe.  She does report worsening bilateral calf and foot claudication but fortunately she does not have any open ulcerations.  Her ABI did decrease to the 0.4 range.  At the present time, there is no evidence of critical limb ischemia.  I favor maximizing a walking exercise program before we proceed with angiography.  This was discussed with her and I provided her with exercise instructions.  If no improvement in symptoms in 3 to 4 months, angiography can be considered.  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  She does have evidence of significant restenosis on the right side.  However, currently there is no clinical indication for intervention.  Continue clinical observation.  3. Coronary artery disease involving native  coronary arteries without angina: Symptoms  resolved after CABG and she is doing well.  .      4. Hyperlipidemia: Continue high-dose rosuvastatin.  I reviewed most recent lipid profile done in January which showed an LDL of 78.   Disposition:   FU with me in 3  month  Signed,  [name], MD  12/17/2021 1:36 PM    [facility]

## 2021-12-17 NOTE — Patient Instructions (Signed)
Medication Instructions:  No changes *If you need a refill on your cardiac medications before your next appointment, please call your pharmacy*   Lab Work: None ordered If you have labs (blood work) drawn today and your tests are completely normal, you will receive your results only by: [place] (if you have MyChart) OR A paper copy in the mail If you have any lab test that is abnormal or we need to change your treatment, we will call you to review the results.   Testing/Procedures: None ordered   Follow-Up: At [facility], you and your health needs are our priority.  As part of our continuing mission to provide you with exceptional heart care, we have created designated Provider Care Teams.  These Care Teams include your primary Cardiologist (physician) and Advanced Practice Providers (APPs -  Physician Assistants and Nurse Practitioners) who all work together to provide you with the care you need, when you need it.  We recommend signing up for the patient portal called "MyChart".  Sign up information is provided on this After Visit Summary.  MyChart is used to connect with patients for Virtual Visits (Telemedicine).  Patients are able to view lab/test results, encounter notes, upcoming appointments, etc.  Non-urgent messages can be sent to your provider as well.   To learn more about what you can do with MyChart, go to [email or web address].    Your next appointment:   3 month(s)  The format for your next appointment:   In Person  Provider:   [name], MD     Other Instructions EXERCISE PROGRAM FOR INDIVIDUALS WITH  PERIPHERAL ARTERIAL DISEASE (PAD)   General Information:   Research in vascular exercise has demonstrated remarkable improvement in symptoms of leg pain (claudication) without expensive or invasive interventions. Regular walking programs are extremely helpful for patients with PAD and intermittent claudication.  These steps are designed to  help you get started with a safe and effective program to help you walk farther with less pain:   Walk at least three times a week (preferably every day).  Your goal is to build up to 30-45 minutes of total walking time (not counting rest breaks). It may take you several weeks to build up your exercise time starting at 5-10 minutes or whatever you can tolerate.  Walk as far as possible using moderate to maximal pain (7-8 on the scale below) as a signal to stop, and resume walking when the pain goes away.  On a treadmill, set the speed and grade at a level that brings on the claudication pain within 3 to 5 minutes. Walk at this rate until you experience claudication of moderate severity, rest until the pain improves, and then resume walking.  Over time, you will be able to walk longer at the designated speed and grade; workload should then be increased until you develop the pain within 3 to 5 minutes once again.  This regimen will induce a significant benefit. Studies have demonstrated that participants may be able to walk up to three or four times farther and have less leg pain, within twelve weeks, by following this protocol.  Pain Scale    0_____1_____2_____3_____4_____5_____6_____7_____8_____9_____10   No Pain                                   Moderate Pain  Maximal Pain

## 2021-12-19 ENCOUNTER — Other Ambulatory Visit: Payer: Self-pay | Admitting: Family

## 2021-12-19 DIAGNOSIS — Z1231 Encounter for screening mammogram for malignant neoplasm of breast: Secondary | ICD-10-CM

## 2021-12-19 DIAGNOSIS — E1169 Type 2 diabetes mellitus with other specified complication: Secondary | ICD-10-CM

## 2021-12-23 ENCOUNTER — Ambulatory Visit (INDEPENDENT_AMBULATORY_CARE_PROVIDER_SITE_OTHER): Payer: Medicare Other

## 2021-12-23 VITALS — Ht 62.0 in | Wt 202.0 lb

## 2021-12-23 DIAGNOSIS — Z0000 Encounter for general adult medical examination without abnormal findings: Secondary | ICD-10-CM

## 2021-12-23 NOTE — Patient Instructions (Signed)
Leah Ferrell , Thank you for taking time to come for your Medicare Wellness Visit. I appreciate your ongoing commitment to your health goals. Please review the following plan we discussed and let me know if I can assist you in the future.   These are the goals we discussed:  Goals       DIET - INCREASE WATER INTAKE      Try to drink 6-8 glasses of water daily.      T2DM PharmD goals (pt-stated)      Current Barriers:  Unable to independently afford treatment regimen Unable to achieve control of T2DM   Pharmacist Clinical Goal(s):  patient will verbalize ability to afford treatment regimen achieve control of T2DM as evidenced by GOAL A1C<7% through collaboration with PharmD and provider.   Interventions: 1:1 collaboration with Sharion Balloon, FNP regarding development and update of comprehensive plan of care as evidenced by provider attestation and co-signature Inter-disciplinary care team collaboration (see longitudinal plan of care) Comprehensive medication review performed; medication list updated in electronic medical record  Diabetes: Goal on Track (progressing): YES. Uncontrolled, A1c 8.2%-->7.6%-->7.3%--patient s/p open heart surgery last may 2022;  New regimen:  1. TRESIBA decrease 10-15 units NIGHTLY (LONG ACTING) 2. FIASP decrease (SHORT ACTING INSULIN) to 10-15 UNITS WITH MEALS 3x DAILY 3. OZEMPIC '2mg'$  WEEKLY  Denies personal and family history of Medullary thyroid cancer (MTC) Having some hypoglycemia early AM (decrease basal and meal times due to increase in ozempic '2mg'$  weekly) Current glucose readings: fasting glucose: <170, 130s per patient, post prandial glucose: n/a Consider libre pending patient preference REPORTS hypoglycemia-adjusted insulin as above Current exercise: bike at home Educated on new basal insulin; new regimen; patient was only using rapid acting insulin--nervous to start basal/bolus--will go slow & titrate up.  Recommend heart healthy diet/plate  method given C1EX/NTZGYFVCBSW issues Assessed patient finances. Enrolled in the [employer] patient assistance program for tresiba, fiasp, ozempic   Patient Goals/Self-Care Activities patient will:  - take medications as prescribed as evidenced by patient report and record review check glucose 4 times daily , document, and provide at future appointments collaborate with provider on medication access solutions target a minimum of 150 minutes of moderate intensity exercise weekly engage in dietary modifications by FOLLOWING A HEART HEALTHY DIET/HEALTHY PLATE METHOD          This is a list of the screening recommended for you and due dates:  Health Maintenance  Topic Date Due   COVID-19 Vaccine (1) Never done   DEXA scan (bone density measurement)  04/26/2020   Cologuard (Stool DNA test)  02/08/2021   Zoster (Shingles) Vaccine (1 of 2) 01/14/2022*   Flu Shot  05/18/2022*   Pneumonia Vaccine (3 - PPSV23 or PCV20) 07/13/2022*   Mammogram  12/24/2021   Hemoglobin A1C  04/16/2022   Eye exam for diabetics  06/18/2022   Yearly kidney health urinalysis for diabetes  07/13/2022   Yearly kidney function blood test for diabetes  10/15/2022   Complete foot exam   10/15/2022   Medicare Annual Wellness Visit  12/24/2022   Tetanus Vaccine  10/15/2031   Hepatitis C Screening: USPSTF Recommendation to screen - Ages 18-79 yo.  Completed   HPV Vaccine  Aged Out  *Topic was postponed. The date shown is not the original due date.    Advanced directives: Advance directive discussed with you today. I have provided a copy for you to complete at home and have notarized. Once this is complete please bring a  copy in to our office so we can scan it into your chart.   Conditions/risks identified: Aim for 30 minutes of exercise or brisk walking, 6-8 glasses of water, and 5 servings of fruits and vegetables each day.   Next appointment: Follow up in one year for your annual wellness visit    Preventive  Care 65 Years and Older, Female Preventive care refers to lifestyle choices and visits with your health care provider that can promote health and wellness. What does preventive care include? A yearly physical exam. This is also called an annual well check. Dental exams once or twice a year. Routine eye exams. Ask your health care provider how often you should have your eyes checked. Personal lifestyle choices, including: Daily care of your teeth and gums. Regular physical activity. Eating a healthy diet. Avoiding tobacco and drug use. Limiting alcohol use. Practicing safe sex. Taking low-dose aspirin every day. Taking vitamin and mineral supplements as recommended by your health care provider. What happens during an annual well check? The services and screenings done by your health care provider during your annual well check will depend on your age, overall health, lifestyle risk factors, and family history of disease. Counseling  Your health care provider may ask you questions about your: Alcohol use. Tobacco use. Drug use. Emotional well-being. Home and relationship well-being. Sexual activity. Eating habits. History of falls. Memory and ability to understand (cognition). Work and work Statistician. Reproductive health. Screening  You may have the following tests or measurements: Height, weight, and BMI. Blood pressure. Lipid and cholesterol levels. These may be checked every 5 years, or more frequently if you are over 34 years old. Skin check. Lung cancer screening. You may have this screening every year starting at age 74 if you have a 30-pack-year history of smoking and currently smoke or have quit within the past 15 years. Fecal occult blood test (FOBT) of the stool. You may have this test every year starting at age 84. Flexible sigmoidoscopy or colonoscopy. You may have a sigmoidoscopy every 5 years or a colonoscopy every 10 years starting at age 5. Hepatitis C blood  test. Hepatitis B blood test. Sexually transmitted disease (STD) testing. Diabetes screening. This is done by checking your blood sugar (glucose) after you have not eaten for a while (fasting). You may have this done every 1-3 years. Bone density scan. This is done to screen for osteoporosis. You may have this done starting at age 67. Mammogram. This may be done every 1-2 years. Talk to your health care provider about how often you should have regular mammograms. Talk with your health care provider about your test results, treatment options, and if necessary, the need for more tests. Vaccines  Your health care provider may recommend certain vaccines, such as: Influenza vaccine. This is recommended every year. Tetanus, diphtheria, and acellular pertussis (Tdap, Td) vaccine. You may need a Td booster every 10 years. Zoster vaccine. You may need this after age 28. Pneumococcal 13-valent conjugate (PCV13) vaccine. One dose is recommended after age 47. Pneumococcal polysaccharide (PPSV23) vaccine. One dose is recommended after age 26. Talk to your health care provider about which screenings and vaccines you need and how often you need them. This information is not intended to replace advice given to you by your health care provider. Make sure you discuss any questions you have with your health care provider. Document Released: 03/02/2015 Document Revised: 10/24/2015 Document Reviewed: 12/05/2014 Elsevier Interactive Patient Education  2017 [employer]  Prevention in the Home Falls can cause injuries. They can happen to people of all ages. There are many things you can do to make your home safe and to help prevent falls. What can I do on the outside of my home? Regularly fix the edges of walkways and driveways and fix any cracks. Remove anything that might make you trip as you walk through a door, such as a raised step or threshold. Trim any bushes or trees on the path to your home. Use  bright outdoor lighting. Clear any walking paths of anything that might make someone trip, such as rocks or tools. Regularly check to see if handrails are loose or broken. Make sure that both sides of any steps have handrails. Any raised decks and porches should have guardrails on the edges. Have any leaves, snow, or ice cleared regularly. Use sand or salt on walking paths during winter. Clean up any spills in your garage right away. This includes oil or grease spills. What can I do in the bathroom? Use night lights. Install grab bars by the toilet and in the tub and shower. Do not use towel bars as grab bars. Use non-skid mats or decals in the tub or shower. If you need to sit down in the shower, use a plastic, non-slip stool. Keep the floor dry. Clean up any water that spills on the floor as soon as it happens. Remove soap buildup in the tub or shower regularly. Attach bath mats securely with double-sided non-slip rug tape. Do not have throw rugs and other things on the floor that can make you trip. What can I do in the bedroom? Use night lights. Make sure that you have a light by your bed that is easy to reach. Do not use any sheets or blankets that are too big for your bed. They should not hang down onto the floor. Have a firm chair that has side arms. You can use this for support while you get dressed. Do not have throw rugs and other things on the floor that can make you trip. What can I do in the kitchen? Clean up any spills right away. Avoid walking on wet floors. Keep items that you use a lot in easy-to-reach places. If you need to reach something above you, use a strong step stool that has a grab bar. Keep electrical cords out of the way. Do not use floor polish or wax that makes floors slippery. If you must use wax, use non-skid floor wax. Do not have throw rugs and other things on the floor that can make you trip. What can I do with my stairs? Do not leave any items on the  stairs. Make sure that there are handrails on both sides of the stairs and use them. Fix handrails that are broken or loose. Make sure that handrails are as long as the stairways. Check any carpeting to make sure that it is firmly attached to the stairs. Fix any carpet that is loose or worn. Avoid having throw rugs at the top or bottom of the stairs. If you do have throw rugs, attach them to the floor with carpet tape. Make sure that you have a light switch at the top of the stairs and the bottom of the stairs. If you do not have them, ask someone to add them for you. What else can I do to help prevent falls? Wear shoes that: Do not have high heels. Have rubber bottoms. Are comfortable and fit you  well. Are closed at the toe. Do not wear sandals. If you use a stepladder: Make sure that it is fully opened. Do not climb a closed stepladder. Make sure that both sides of the stepladder are locked into place. Ask someone to hold it for you, if possible. Clearly mark and make sure that you can see: Any grab bars or handrails. First and last steps. Where the edge of each step is. Use tools that help you move around (mobility aids) if they are needed. These include: Canes. Walkers. Scooters. Crutches. Turn on the lights when you go into a dark area. Replace any light bulbs as soon as they burn out. Set up your furniture so you have a clear path. Avoid moving your furniture around. If any of your floors are uneven, fix them. If there are any pets around you, be aware of where they are. Review your medicines with your doctor. Some medicines can make you feel dizzy. This can increase your chance of falling. Ask your doctor what other things that you can do to help prevent falls. This information is not intended to replace advice given to you by your health care provider. Make sure you discuss any questions you have with your health care provider. Document Released: 11/30/2008 Document Revised:  07/12/2015 Document Reviewed: 03/10/2014 Elsevier Interactive Patient Education  2017 [employer].

## 2021-12-23 NOTE — Progress Notes (Signed)
Subjective:   Leah Ferrell is a 71 y.o. female who presents for Medicare Annual (Subsequent) preventive examination.   I connected with  [name] on 12/23/21 by a audio enabled telemedicine application and verified that I am speaking with the correct person using two identifiers.  Patient Location: Home  Provider Location: Home Office  I discussed the limitations of evaluation and management by telemedicine. The patient expressed understanding and agreed to proceed.  Review of Systems     Cardiac Risk Factors include: advanced age (>51mn, >>51women);diabetes mellitus;hypertension;dyslipidemia     Objective:    Today's Vitals   12/23/21 1405  Weight: 202 lb (91.6 kg)  Height: _0  (1.575 m)   Body mass index is 36.95 kg/m.     12/23/2021    2:11 PM 11/23/2020    4:18 PM 06/30/2020    8:00 AM 06/21/2020    2:14 PM 05/09/2020   12:24 PM 03/16/2019    3:03 PM 03/16/2019    7:03 AM  Advanced Directives  Does Patient Have a Medical Advance Directive? Yes _1  No  Type of [place]        Copy of HHop [place] Chart? No - copy requested        Would patient like information on creating a medical advance directive?  No - Patient declined No - Patient declined  No - Patient declined No - Patient declined No - Patient declined    Current Medications (verified) Outpatient Encounter Medications as of 12/23/2021  Medication Sig   acetaminophen (TYLENOL) 325 MG tablet Take 2 tablets (650 mg total) by mouth every 6 (six) hours as needed for mild pain (or Fever >/= 101).   albuterol (VENTOLIN HFA) 108 (90 Base) MCG/ACT inhaler INHALE 1 TO 2 PUFFS BY MOUTH EVERY 6 HOURS AS NEEDED FOR WHEEZING AND FOR SHORTNESS OF BREATH   amLODipine (NORVASC) 5 MG tablet Take 1 tablet by mouth once daily   Ascorbic Acid (VITAMIN C) 1000 MG tablet Take 1,000 mg by mouth 3 (three) times daily.    aspirin EC 81 MG  tablet Take 81 mg by mouth daily. In the morning   BD ULTRA-FINE PEN NEEDLES 29G X 12.7MM MISC USE PENNEEDLES 3 TIMES  DAILY AS DIRECTED   blood glucose meter kit and supplies Dispense based on patient and insurance preference. Use up to four times daily as directed. (FOR ICD-10 E10.9, E11.9).   Calcium Carbonate-Vit D-Min (CALTRATE 600+D PLUS MINERALS) 600-800 MG-UNIT TABS Take 1 tablet by mouth 3 (three) times daily.    carvedilol (COREG) 6.25 MG tablet Take 1 tablet by mouth twice daily   Cholecalciferol (VITAMIN D-3) 125 MCG (5000 UT) TABS Take 5,000 Units by mouth daily.   clopidogrel (PLAVIX) 75 MG tablet Take 1 tablet by mouth once daily   fish oil-omega-3 fatty acids 1000 MG capsule Take 1 g by mouth daily.    glucose blood (GLUCOSE METER TEST) test strip Use 6-10 times a day, Uses ReliOn   halobetasol (ULTRAVATE) 0.05 % cream Apply 1 application topically daily as needed (For rash).   HUMALOG KWIKPEN 200 UNIT/ML KwikPen INJECT 50 UNITS SUBCUTANEOUSLY THREE TIMES DAILY WITH MEALS (Patient taking differently: 10-20 Units with breakfast, with lunch, and with evening meal.)   hydroxypropyl methylcellulose (ISOPTO TEARS) 2.5 % ophthalmic solution Place 1 drop into both eyes 3 (three) times daily as needed (for dry eyes).   insulin degludec (  TRESIBA FLEXTOUCH) 100 UNIT/ML FlexTouch Pen Inject 15-30 Units into the skin daily.   Insulin Syringe 27G X 1/2" 0.5 ML MISC Use with Fiasp insulin TID   lisinopril (ZESTRIL) 20 MG tablet Take 1 tablet by mouth twice daily   montelukast (SINGULAIR) 10 MG tablet Take 1 tablet by mouth once daily with breakfast   ondansetron (ZOFRAN ODT) 4 MG disintegrating tablet Take 1 tablet (4 mg total) by mouth every 8 (eight) hours as needed for nausea or vomiting.   RELION PEN NEEDLES 29G X 12MM MISC USE 1 NEEDLE TO INJECT INSULIN SUBCUTANEOUSLY 3 TIMES DAILY (Patient taking differently: 1 Device by Subconjunctival route 3 (three) times daily.)   rosuvastatin (CRESTOR)  40 MG tablet Take 1 tablet by mouth once daily   Semaglutide, 2 MG/DOSE, (OZEMPIC, 2 MG/DOSE,) 8 MG/3ML SOPN Inject 2 mg into the skin once a week.   No facility-administered encounter medications on file as of 12/23/2021.    Allergies (verified) Tape   History: Past Medical History:  Diagnosis Date   CAD (coronary artery disease)    a. 04/2012 NSTEMI: s/p DES to LAD.   Chronic diastolic CHF (congestive heart failure) (HCC)    Diabetes mellitus without complication (HCC)    Dysrhythmia    Environmental and seasonal allergies    Hyperlipidemia    Hypertension    Leukocytosis    Morbid obesity (HCC)    NSTEMI (non-ST elevated myocardial infarction) ([place]) 04/27/2012   PONV (postoperative nausea and vomiting)    PVD (peripheral vascular disease) ([place])    a. 04/2012 ABI: R 0.49, L 0.39. Angiography 06/14: Significant ostial left common iliac artery stenosis extending into the distal aorta, severe left common femoral artery stenosis, bilateral SFA occlusion with heavy calcifications. Functionally, one-vessel runoff bilaterally below the knee   Renal artery stenosis (HCC)    s/p angioplasty/stenting bilateral renal arteries 03/16/19   Shortness of breath    Past Surgical History:  Procedure Laterality Date   ABDOMINAL AORTAGRAM N/A 07/21/2012   Procedure: ABDOMINAL Maxcine Ham;  Surgeon: Wellington Hampshire, MD;  Location: [place] CATH LAB;  Service: Cardiovascular;  Laterality: N/A;   ABDOMINAL AORTOGRAM W/LOWER EXTREMITY N/A 05/09/2020   Procedure: ABDOMINAL AORTOGRAM W/LOWER EXTREMITY;  Surgeon: Wellington Hampshire, MD;  Location: [place] CV LAB;  Service: Cardiovascular;  Laterality: N/A;   CARDIAC CATHETERIZATION     stents X 2   CORONARY ANGIOGRAM  04/27/2012   Procedure: CORONARY ANGIOGRAM;  Surgeon: Thayer Headings, MD;  Location: [facility] CATH LAB;  Service: Cardiovascular;;   CORONARY ARTERY BYPASS GRAFT N/A 06/25/2020   Procedure: CORONARY ARTERY BYPASS GRAFTING (CABG)X3. LEFT INTERNAL MAMMARY  ARTERY. RIGHT ENDOSCOPIC SAPHENOUS VEIN HARVESTING.;  Surgeon: Wonda Olds, MD;  Location: [place];  Service: Open Heart Surgery;  Laterality: N/A;   ENDARTERECTOMY FEMORAL Left 11/02/2013   Procedure: RESECTION LEFT COMMON FEMORAL ARTERY AND INSERTION OF INTERPOSITION 52m HEMASHIELD GRAFT FROM LEFT EXTERNAL  ILIAC ARTERY TO LEFT PROFUNDA  FEMORIS ARTERY;  Surgeon: JMal Misty MD;  [place]  Service: Vascular;  Laterality: Left;   EYE SURGERY Right 2017   FRACTURE SURGERY Right Jul 07, 2014   Right Foot-  [place]  [facility]  by Heat duct   ILIAC ARTERY STENT Left 08/31/2013   LAD stent     LEFT HEART CATH AND CORONARY ANGIOGRAPHY N/A 05/09/2020   Procedure: LEFT HEART CATH AND CORONARY ANGIOGRAPHY;  Surgeon: AWellington Hampshire MD;  [place] [facility];  Service:  Cardiovascular;  Laterality: N/A;   LEFT HEART CATHETERIZATION WITH CORONARY ANGIOGRAM N/A 04/23/2012   Procedure: LEFT HEART CATHETERIZATION WITH CORONARY ANGIOGRAM;  Surgeon: Peter M Martinique, MD;  Location[facility] C[facility] LAB;  Service: Cardiovascular;  Laterality: N/A;   LOWER EXTREMITY ANGIOGRAM Left 08/31/2013   Procedure: LOWER EXTREMITY ANGIOGRAM;  Surgeon: Wellington Hampshire, MD;  Location: [place] CATH LAB;  Service: Cardiovascular;  Laterality: Left;   PERCUTANEOUS STENT INTERVENTION Left 08/31/2013   Procedure: PERCUTANEOUS STENT INTERVENTION;  Surgeon: Wellington Hampshire, MD;  Location: [place] CATH LAB;  Service: Cardiovascular;  Laterality: Left;  Common Iliac artery   PERIPHERAL VASCULAR CATHETERIZATION N/A 03/19/2016   Procedure: Abdominal Aortogram w/Lower Extremity;  Surgeon: Wellington Hampshire, MD;  Location: [place] CV LAB;  Service: Cardiovascular;  Laterality: N/A;   PERIPHERAL VASCULAR CATHETERIZATION  03/19/2016   Procedure: Peripheral Vascular Balloon Angioplasty-CFA Left;  Surgeon: Wellington Hampshire, MD;  Location: [place] CV LAB;  Service: Cardiovascular;;   PERIPHERAL VASCULAR INTERVENTION Bilateral 03/16/2019    Procedure: PERIPHERAL VASCULAR INTERVENTION;  Surgeon: Wellington Hampshire, MD;  Location: [place] CV LAB;  Service: Cardiovascular;  Laterality: Bilateral;  RENAL   PERIPHERAL VASCULAR INTERVENTION Bilateral 05/09/2020   Procedure: PERIPHERAL VASCULAR INTERVENTION;  Surgeon: Wellington Hampshire, MD;  Location: [place] CV LAB;  Service: Cardiovascular;  Laterality: Bilateral;  Iliac   RENAL ANGIOGRAPHY  03/16/2019   RENAL ANGIOGRAPHY Bilateral 03/16/2019   Procedure: RENAL ANGIOGRAPHY;  Surgeon: Wellington Hampshire, MD;  Location: [place] CV LAB;  Service: Cardiovascular;  Laterality: Bilateral;   TEE WITHOUT CARDIOVERSION N/A 06/25/2020   Procedure: TRANSESOPHAGEAL ECHOCARDIOGRAM (TEE);  Surgeon: Wonda Olds, MD;  Location: [place];  Service: Open Heart Surgery;  Laterality: N/A;   TUMOR REMOVAL     tumor removed from [place]   Family History  Problem Relation Age of Onset   Diabetes Mother    Hyperlipidemia Mother    Hypertension Mother    Peripheral vascular disease Mother    Other Sister        drowned   Other Sister        drowned   Diabetes Daughter        borderline   Hypertension Daughter    Diabetes Maternal Grandmother    Heart attack Maternal Grandfather    Heart disease Brother    Diabetes Brother    Alcohol abuse Brother    Diabetes Son    Hypertension Son    Hyperlipidemia Son    Breast cancer Neg Hx    Social History   Socioeconomic History   Marital status: Widowed    Spouse name: Not on file   Number of children: 4   Years of education: completed school in [place]    Highest education level: Not on file  Occupational History   Occupation: retired  Tobacco Use   Smoking status: Former    Packs/day: 1.00    Years: 36.00    Total pack years: 36.00    Types: Cigarettes    Start date: 04/21/1964    Quit date: 02/17/2001    Years since quitting: 20.8   Smokeless tobacco: Never  Vaping Use   Vaping Use: Never used  Substance and Sexual Activity    Alcohol use: No    Alcohol/week: 0.0 standard drinks of alcohol   Drug use: No   Sexual activity: Not Currently    Birth control/protection: Post-menopausal  Other Topics Concern   Not on file  Social History  Narrative   Lives alone - one level - all of her children live far away   Social Determinants of Health   Financial Resource Strain: Low Risk  (12/23/2021)   Overall Financial Resource Strain (CARDIA)    Difficulty of Paying Living Expenses: Not hard at all  Food Insecurity: No Food Insecurity (12/23/2021)   Hunger Vital Sign    Worried About Running Out of Food in the Last Year: Never true    Ran Out of Food in the Last Year: Never true  Transportation Needs: No Transportation Needs (12/23/2021)   PRAPARE - Hydrologist (Medical): No    Lack of Transportation (Non-Medical): No  Physical Activity: Insufficiently Active (12/23/2021)   Exercise Vital Sign    Days of Exercise per Week: 3 days    Minutes of Exercise per Session: 30 min  Stress: No Stress Concern Present (12/23/2021)   [place]    Feeling of Stress : Not at all  Social Connections: Moderately Isolated (12/23/2021)   Social Connection and Isolation Panel [NHANES]    Frequency of Communication with Friends and Family: More than three times a week    Frequency of Social Gatherings with Friends and Family: More than three times a week    Attends Religious Services: More than 4 times per year    Active Member of [employer] or Organizations: No    Attends Archivist Meetings: Never    Marital Status: Widowed    Tobacco Counseling Counseling given: Not Answered   Clinical Intake:  Pre-visit preparation completed: Yes  Pain : No/denies pain     Nutritional Risks: None Diabetes: No  How often do you need to have someone help you when you read instructions, pamphlets, or other written materials from your doctor  or pharmacy?: 1 - Never  Diabetic?yes  Nutrition Risk Assessment:  Has the patient had any N/V/D within the last 2 months?  No  Does the patient have any non-healing wounds?  No  Has the patient had any unintentional weight loss or weight gain?  No   Diabetes:  Is the patient diabetic?  Yes  If diabetic, was a CBG obtained today?  No  Did the patient bring in their glucometer from home?  No  How often do you monitor your CBG's? Daily .   Financial Strains and Diabetes Management:  Are you having any financial strains with the device, your supplies or your medication? No .  Does the patient want to be seen by Chronic Care Management for management of their diabetes?  No  Would the patient like to be referred to a Nutritionist or for Diabetic Management?  No   Diabetic Exams:  Diabetic Eye Exam: Completed 08/2021 Diabetic Foot Exam: Overdue, Pt has been advised about the importance in completing this exam. Pt is scheduled for diabetic foot exam on next office visit .   Interpreter Needed?: No  Information entered by :: Jadene Pierini, LPN   Activities of Daily Living    12/23/2021    2:11 PM  In your present state of health, do you have any difficulty performing the following activities:  Hearing? 0  Vision? 0  Difficulty concentrating or making decisions? 0  Walking or climbing stairs? 0  Dressing or bathing? 0  Doing errands, shopping? 0  Preparing Food and eating ? N  Using the Toilet? N  In the past six months, have you accidently leaked  urine? N  Do you have problems with loss of bowel control? N  Managing your Medications? N  Managing your Finances? N  Housekeeping or managing your Housekeeping? N    Patient Care Team: Sharion Balloon, FNP as PCP - General (Nurse Practitioner) Wellington Hampshire, MD as PCP - Cardiology (Cardiology) Wellington Hampshire, MD as Consulting Physician (Cardiology) Lavera Guise, [facility] (Pharmacist)  Indicate any recent Medical  Services you may have received from other than [facility] providers in the past year (date may be approximate).     Assessment:   This is a routine wellness examination for [place].  Hearing/Vision screen Vision Screening - Comments:: Annual eye exams wear glasses   Dietary issues and exercise activities discussed: Current Exercise Habits: Home exercise routine, Type of exercise: walking, Time (Minutes): 30, Frequency (Times/Week): 3, Weekly Exercise (Minutes/Week): 90, Exercise limited by: orthopedic condition(s)   Goals Addressed             This Visit's Progress    DIET - INCREASE WATER INTAKE   On track    Try to drink 6-8 glasses of water daily.       Depression Screen    12/23/2021    2:10 PM 10/14/2021   11:30 AM 07/12/2021   11:35 AM 11/23/2020    4:15 PM 11/23/2020   11:28 AM 08/23/2020   11:20 AM 08/01/2020   11:03 AM  PHQ 2/9 Scores  PHQ - 2 Score 0 0 0 0 0 0 0  PHQ- 9 Score    0 0      Fall Risk    12/23/2021    2:08 PM 10/14/2021   11:30 AM 07/12/2021   11:35 AM 11/23/2020    4:18 PM 08/23/2020   11:20 AM  [place] in the past year? 0 0 0 0 0  Number falls in past yr: 0   0   Injury with Fall? 0   0   Risk for fall due to : No Fall Risks   Impaired balance/gait;Orthopedic patient   Follow up Falls prevention discussed   Education provided;Falls prevention discussed     FALL RISK PREVENTION PERTAINING TO THE HOME:  Any stairs in or around the home? Yes  If so, are there any without handrails? No  Home free of loose throw rugs in walkways, pet beds, electrical cords, etc? Yes  Adequate lighting in your home to reduce risk of falls? Yes   ASSISTIVE DEVICES UTILIZED TO PREVENT FALLS:  Life alert? No  Use of a cane, walker or w/c? No  Grab bars in the bathroom? No  Shower chair or bench in shower? No  Elevated toilet seat or a handicapped toilet? No          12/23/2021    2:11 PM 11/23/2020    4:23 PM 10/08/2018    1:56 PM  6CIT Screen  What  Year? 0 points 0 points 0 points  What month? 0 points 0 points 0 points  What time? 0 points 0 points 0 points  Count back from 20 0 points 0 points 0 points  Months in reverse 0 points 0 points 0 points  Repeat phrase 0 points 6 points 4 points  Total Score 0 points 6 points 4 points    Immunizations Immunization History  Administered Date(s) Administered   Hepatitis B, adult 04/11/2017, 05/18/2017   Pneumococcal Conjugate-13 11/06/2016   Pneumococcal Polysaccharide-23 10/17/2015   Tdap 08/19/2011, 10/14/2021   Zoster,  Live 09/26/2014    TDAP status: Up to date  Flu Vaccine status: Up to date  Pneumococcal vaccine status: Up to date  Covid-19 vaccine status: Completed vaccines  Qualifies for Shingles Vaccine? Yes   Zostavax completed No   Shingrix Completed?: No.    Education has been provided regarding the importance of this vaccine. Patient has been advised to call insurance company to determine out of pocket expense if they have not yet received this vaccine. Advised may also receive vaccine at local pharmacy or Health Dept. Verbalized acceptance and understanding.  Screening Tests Health Maintenance  Topic Date Due   COVID-19 Vaccine (1) Never done   DEXA SCAN  04/26/2020   Fecal DNA (Cologuard)  02/08/2021   Zoster Vaccines- Shingrix (1 of 2) 01/14/2022 (Originally 04/21/2001)   INFLUENZA VACCINE  05/18/2022 (Originally 09/17/2021)   Pneumonia Vaccine 63+ Years old (3 - PPSV23 or PCV20) 07/13/2022 (Originally 10/16/2020)   MAMMOGRAM  12/24/2021   HEMOGLOBIN A1C  04/16/2022   OPHTHALMOLOGY EXAM  06/18/2022   Diabetic kidney evaluation - Urine ACR  07/13/2022   Diabetic kidney evaluation - GFR measurement  10/15/2022   FOOT EXAM  10/15/2022   Medicare Annual Wellness (AWV)  12/24/2022   TETANUS/TDAP  10/15/2031   Hepatitis C Screening  Completed   HPV VACCINES  Aged Out    Health Maintenance  Health Maintenance Due  Topic Date Due   COVID-19 Vaccine (1) Never  done   DEXA SCAN  04/26/2020   Fecal DNA (Cologuard)  02/08/2021    Colorectal cancer screening: Referral to GI placed patient has cologuard kit to completed . Pt aware the office will call re: appt.  Mammogram status: Ordered Schedule 01/13/2022. Pt provided with contact info and advised to call to schedule appt.   Bone Density status: Ordered 10/14/2021. Pt provided with contact info and advised to call to schedule appt.  Lung Cancer Screening: (Low Dose CT Chest recommended if Age 3-80 years, 30 pack-year currently smoking OR have quit w/in 15years.) does not qualify.   Lung Cancer Screening Referral: n/a  Additional Screening:  Hepatitis C Screening: does not qualify;   Vision Screening: Recommended annual ophthalmology exams for early detection of glaucoma and other disorders of the eye. Is the patient up to date with their annual eye exam?  Yes  Who is the provider or what is the name of the office in which the patient attends annual eye exams? Dr.Rhodes  If pt is not established with a provider, would they like to be referred to a provider to establish care? No .   Dental Screening: Recommended annual dental exams for proper oral hygiene  Community Resource Referral / Chronic Care Management: CRR required this visit?  No   CCM required this visit?  No      Plan:     I have personally reviewed and noted the following in the patient's chart:   Medical and social history Use of alcohol, tobacco or illicit drugs  Current medications and supplements including opioid prescriptions. Patient is not currently taking opioid prescriptions. Functional ability and status Nutritional status Physical activity Advanced directives List of other physicians Hospitalizations, surgeries, and ER visits in previous 12 months Vitals Screenings to include cognitive, depression, and falls Referrals and appointments  In addition, I have reviewed and discussed with patient certain  preventive protocols, quality metrics, and best practice recommendations. A written personalized care plan for preventive services as well as general preventive health recommendations were provided to  patient.     Daphane Shepherd, LPN   18/06/6312   Nurse Notes: Due Cologuard ,patient has kit

## 2022-01-07 ENCOUNTER — Ambulatory Visit: Payer: Medicare Other | Admitting: Cardiovascular Disease

## 2022-01-07 ENCOUNTER — Other Ambulatory Visit: Payer: Self-pay | Admitting: Family

## 2022-01-07 DIAGNOSIS — I152 Hypertension secondary to endocrine disorders: Secondary | ICD-10-CM

## 2022-01-13 ENCOUNTER — Ambulatory Visit
Admission: RE | Admit: 2022-01-13 | Discharge: 2022-01-13 | Disposition: A | Discharge status: Home / Self Care | Payer: Medicare Other | Source: Ambulatory Visit | Attending: Family | Admitting: Family

## 2022-01-13 DIAGNOSIS — Z1231 Encounter for screening mammogram for malignant neoplasm of breast: Secondary | ICD-10-CM

## 2022-01-14 ENCOUNTER — Telehealth: Payer: Self-pay | Admitting: Family

## 2022-01-14 ENCOUNTER — Ambulatory Visit (INDEPENDENT_AMBULATORY_CARE_PROVIDER_SITE_OTHER): Payer: Medicare Other

## 2022-01-14 ENCOUNTER — Other Ambulatory Visit: Payer: Self-pay | Admitting: Family

## 2022-01-14 ENCOUNTER — Ambulatory Visit (INDEPENDENT_AMBULATORY_CARE_PROVIDER_SITE_OTHER): Payer: Medicare Other | Admitting: Family

## 2022-01-14 ENCOUNTER — Encounter: Payer: Self-pay | Admitting: Family

## 2022-01-14 VITALS — BP 135/65 | HR 59 | Temp 97.8°F | Ht 62.0 in | Wt 200.0 lb

## 2022-01-14 DIAGNOSIS — Z1211 Encounter for screening for malignant neoplasm of colon: Secondary | ICD-10-CM | POA: Diagnosis not present

## 2022-01-14 DIAGNOSIS — Z780 Asymptomatic menopausal state: Secondary | ICD-10-CM

## 2022-01-14 DIAGNOSIS — I2510 Atherosclerotic heart disease of native coronary artery without angina pectoris: Secondary | ICD-10-CM

## 2022-01-14 DIAGNOSIS — Z23 Encounter for immunization: Secondary | ICD-10-CM

## 2022-01-14 DIAGNOSIS — I739 Peripheral vascular disease, unspecified: Secondary | ICD-10-CM

## 2022-01-14 DIAGNOSIS — E1151 Type 2 diabetes mellitus with diabetic peripheral angiopathy without gangrene: Secondary | ICD-10-CM

## 2022-01-14 DIAGNOSIS — I5033 Acute on chronic diastolic (congestive) heart failure: Secondary | ICD-10-CM

## 2022-01-14 DIAGNOSIS — M85851 Other specified disorders of bone density and structure, right thigh: Secondary | ICD-10-CM | POA: Diagnosis not present

## 2022-01-14 DIAGNOSIS — Z951 Presence of aortocoronary bypass graft: Secondary | ICD-10-CM

## 2022-01-14 DIAGNOSIS — I252 Old myocardial infarction: Secondary | ICD-10-CM

## 2022-01-14 DIAGNOSIS — E1159 Type 2 diabetes mellitus with other circulatory complications: Secondary | ICD-10-CM | POA: Diagnosis not present

## 2022-01-14 DIAGNOSIS — I152 Hypertension secondary to endocrine disorders: Secondary | ICD-10-CM | POA: Diagnosis not present

## 2022-01-14 LAB — BAYER DCA HB A1C WAIVED: HB A1C (BAYER DCA - WAIVED): 7.7 % — ABNORMAL HIGH (ref 4.8–5.6)

## 2022-01-14 NOTE — Telephone Encounter (Signed)
Pt called requesting to speak with nurse Caryl Pina.

## 2022-01-14 NOTE — Progress Notes (Signed)
Subjective:    Patient ID: Leah Ferrell, female    DOB: [birth year], 71 y.o.   MRN: [id number]  Chief Complaint  Patient presents with   Medical Management of Chronic Issues    Pt presents to the office today for chronic follow up. Pt has CHF, CAD and hx MI and is followed by Cardiologists every 6 months. PT is followed by Vascular  for PVD and PAD. PT states these are stable.    Pt had a renal angiography on 03/16/19. She reports her swelling is improved and her BP is improved.     She had critical lower limb ischemia and had abdominal aortogram with lower extremity. 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    Pt is morbid obese because her BMI is 36 and has DM and HTN.  Hypertension This is a chronic problem. The current episode started more than 1 year ago. The problem has been resolved since onset. The problem is controlled. Associated symptoms include malaise/fatigue and peripheral edema. Pertinent negatives include no blurred vision or shortness of breath. Risk factors for coronary artery disease include dyslipidemia, obesity and sedentary lifestyle. The current treatment provides moderate improvement. Hypertensive end-organ damage includes CAD/MI and heart failure.  Diabetes She presents for her follow-up diabetic visit. She has type 2 diabetes mellitus. Associated symptoms include fatigue. Pertinent negatives for diabetes include no blurred vision and no foot paresthesias. Symptoms are stable. Diabetic complications include heart disease. Risk factors for coronary artery disease include diabetes mellitus, hypertension, sedentary lifestyle, post-menopausal, dyslipidemia and obesity. She is following a generally unhealthy diet. Her overall blood glucose range is 180-200 mg/dl. Eye exam is current.  Congestive Heart Failure Presents for follow-up visit. Associated symptoms include edema and fatigue. Pertinent negatives include no shortness of breath. The symptoms have been stable.      Review  of Systems  Constitutional:  Positive for fatigue and malaise/fatigue.  Eyes:  Negative for blurred vision.  Respiratory:  Negative for shortness of breath.   All other systems reviewed and are negative.      Objective:   Physical Exam Vitals reviewed.  Constitutional:      General: She is not in acute distress.    Appearance: She is well-developed. She is obese.  HENT:     Head: Normocephalic and atraumatic.     Right Ear: Tympanic membrane normal.     Left Ear: Tympanic membrane normal.  Eyes:     Pupils: Pupils are equal, round, and reactive to light.  Neck:     Thyroid: No thyromegaly.  Cardiovascular:     Rate and Rhythm: Normal rate and regular rhythm.     Heart sounds: Normal heart sounds. No murmur heard. Pulmonary:     Effort: Pulmonary effort is normal. No respiratory distress.     Breath sounds: Normal breath sounds. No wheezing.  Abdominal:     General: Bowel sounds are normal. There is no distension.     Palpations: Abdomen is soft.     Tenderness: There is no abdominal tenderness.  Musculoskeletal:        General: No tenderness. Normal range of motion.     Cervical back: Normal range of motion and neck supple.     Right lower leg: Edema (trace) present.  Skin:    General: Skin is warm and dry.  Neurological:     Mental Status: She is alert and oriented to person, place, and time.     Cranial Nerves: No cranial nerve deficit.  Deep Tendon Reflexes: Reflexes are normal and symmetric.  Psychiatric:        Behavior: Behavior normal.        Thought Content: Thought content normal.        Judgment: Judgment normal.      BP 135/65   Pulse (!) 59   Temp 97.8 F (36.6 C) (Temporal)   Ht _0  (1.575 m)   Wt 200 lb (90.7 kg)   SpO2 98%   BMI 36.58 kg/m      Assessment & Plan:   Leah Ferrell comes in today with chief complaint of Medical Management of Chronic Issues   Diagnosis and orders addressed:  1. S/P CABG x 3 - CMP14+EGFR  2.  PVD (peripheral vascular disease) ([place]) - CMP14+EGFR  3. PAD (peripheral artery disease) ([facility]) - CMP14+EGFR  4. Morbid obesity ([place]) - CMP14+EGFR  5. Hypertension associated with diabetes ([place]) - CMP14+EGFR  6. Hx of non-ST elevation myocardial infarction (NSTEMI) - CMP14+EGFR  7. Diabetes mellitus with peripheral vascular disease ([place]) - CMP14+EGFR - Bayer DCA Hb A1c Waived  8. Coronary artery disease involving native coronary artery of native heart without angina pectoris - CMP14+EGFR  9. Acute on chronic diastolic CHF (congestive heart failure) ([facility]) - CMP14+EGFR  10. Post-menopausal - DG [facility] DEXA - CMP14+EGFR  11. Colon cancer screening - Cologuard - CMP14+EGFR   Labs pending Health Maintenance reviewed Diet and exercise encouraged  Follow up plan: 3 months    Evelina Dun, FNP

## 2022-01-14 NOTE — Patient Instructions (Signed)

## 2022-01-14 NOTE — Telephone Encounter (Signed)
This is in result notes. Patient aware

## 2022-01-15 LAB — CMP14+EGFR
ALT: 23 IU/L (ref 0–32)
AST: 22 IU/L (ref 0–40)
Albumin/Globulin Ratio: 1.5 (ref 1.2–2.2)
Albumin: 4.4 g/dL (ref 3.9–4.9)
Alkaline Phosphatase: 64 IU/L (ref 44–121)
BUN/Creatinine Ratio: 15 (ref 12–28)
BUN: 11 mg/dL (ref 8–27)
Bilirubin Total: 0.2 mg/dL (ref 0.0–1.2)
CO2: 26 mmol/L (ref 20–29)
Calcium: 9.6 mg/dL (ref 8.7–10.3)
Chloride: 98 mmol/L (ref 96–106)
Creatinine, Ser: 0.75 mg/dL (ref 0.57–1.00)
Globulin, Total: 2.9 g/dL (ref 1.5–4.5)
Glucose: 166 mg/dL — ABNORMAL HIGH (ref 70–99)
Potassium: 4.7 mmol/L (ref 3.5–5.2)
Sodium: 138 mmol/L (ref 134–144)
Total Protein: 7.3 g/dL (ref 6.0–8.5)
eGFR: 86 mL/min/{1.73_m2} (ref >59)

## 2022-01-16 ENCOUNTER — Ambulatory Visit (INDEPENDENT_AMBULATORY_CARE_PROVIDER_SITE_OTHER): Payer: Medicare Other | Admitting: Pharmacist

## 2022-01-16 ENCOUNTER — Telehealth: Payer: Self-pay | Admitting: Pharmacist

## 2022-01-16 DIAGNOSIS — E1159 Type 2 diabetes mellitus with other circulatory complications: Secondary | ICD-10-CM

## 2022-01-16 DIAGNOSIS — E1142 Type 2 diabetes mellitus with diabetic polyneuropathy: Secondary | ICD-10-CM

## 2022-01-16 DIAGNOSIS — I152 Hypertension secondary to endocrine disorders: Secondary | ICD-10-CM

## 2022-01-16 DIAGNOSIS — Z794 Long term (current) use of insulin: Secondary | ICD-10-CM

## 2022-01-16 NOTE — Patient Instructions (Signed)
Visit Information  Current Barriers:  Unable to independently afford treatment regimen Unable to achieve control of T2DM   Pharmacist Clinical Goal(s):  patient will verbalize ability to afford treatment regimen achieve control of T2DM as evidenced by GOAL A1C<7% through collaboration with PharmD and provider.   Interventions: 1:1 collaboration with Sharion Balloon, FNP regarding development and update of comprehensive plan of care as evidenced by provider attestation and co-signature Inter-disciplinary care team collaboration (see longitudinal plan of care) Comprehensive medication review performed; medication list updated in electronic medical record  Diabetes: Goal on Track (progressing): YES. Uncontrolled, A1c up slightly to 7.7%--patient s/p open heart surgery May 2022;  New regimen:  1. TRESIBA increase 16 units NIGHTLY (LONG ACTING--patient refers to as the "green pen" since it has green label) 2. FIASP (SHORT ACTING/meal time INSULIN) to 20-30 UNITS WITH MEALS 3x DAILY 3. OZEMPIC '2mg'$  WEEKLY  Denies personal and family history of Medullary thyroid cancer (MTC) Current glucose readings: fasting glucose: <170, 130s per patient, post prandial glucose: n/a Consider libre pending patient preference DENIES hypoglycemia-adjusted insulin as above Current exercise: bike at home Educated on new basal insulin; new regimen; Recommend heart healthy diet/plate method given [id number] issues Assessed patient finances. WILL RE-Enroll in the [employer] n[employer] patient assistance program for tresiba, fiasp, ozempic   Patient Goals/Self-Care Activities patient will:  - take medications as prescribed as evidenced by patient report and record review check glucose 4 times daily , document, and provide at future appointments collaborate with provider on medication access solutions target a minimum of 150 minutes of moderate intensity exercise weekly engage in dietary modifications by FOLLOWING A  HEART HEALTHY DIET/HEALTHY PLATE METHOD    Plan: Telephone follow up appointment with care management team member scheduled for:  3 months  Signature Regina Eck, PharmD, BCPS Clinical Pharmacist, [place]  [facility] Phone [phone]   Please call the care guide team at [phone] if you need to cancel or reschedule your appointment.   The patient verbalized understanding of instructions, educational materials, and care plan provided today and DECLINED offer to receive copy of patient instructions, educational materials, and care plan.

## 2022-01-16 NOTE — Progress Notes (Signed)
Chronic Care Management Pharmacy Note  01/16/2022 Name:  Leah Ferrell MRN:  [id number] DOB:  06-25-1950  Summary:  Diabetes: Goal on Track (progressing): YES. Uncontrolled, A1c up slightly to 7.7%--patient s/p open heart surgery May 2022;  New regimen:  1. TRESIBA increase 16 units NIGHTLY (LONG ACTING--patient refers to as the "green pen" since it has green label) 2. FIASP (SHORT ACTING/meal time INSULIN) to 20-30 UNITS WITH MEALS 3x DAILY 3. OZEMPIC 18m WEEKLY  Denies personal and family history of Medullary thyroid cancer (MTC) Current glucose readings: fasting glucose: <170, 130s per patient, post prandial glucose: n/a Consider libre pending patient preference DENIES hypoglycemia-adjusted insulin as above Current exercise: bike at home Educated on new basal insulin; new regimen; Recommend heart healthy diet/plate method given [id number] Assessed patient finances. WILL RE-Enroll in the novo nordisk patient assistance program for tresiba, fiasp, ozempic   Patient Goals/Self-Care Activities patient will:  - take medications as prescribed as evidenced by patient report and record review check glucose 4 times daily , document, and provide at future appointments collaborate with provider on medication access solutions target a minimum of 150 minutes of moderate intensity exercise weekly engage in dietary modifications by FOLLOWING A HEART HEALTHY DIET/HEALTHY PLATE METHOD   Subjective: Leah EMIGHis an 71y.o. year old female who is a primary patient of HSharion Balloon FNP.  The patient was referred to the Chronic Care Management team for assistance with care management needs subsequent to provider initiation of CCM services and plan of care.    Engaged with patient by telephone for follow up visit in response to provider referral for CCM services.   Objective:  LABS:    Lab Results  Component Value Date   CREATININE 0.75 01/14/2022   CREATININE  0.72 10/14/2021   CREATININE 0.81 07/12/2021     Lab Results  Component Value Date   HGBA1C 7.7 (H) 01/14/2022         Component Value Date/Time   CHOL 171 02/22/2021 1103   TRIG 140 02/22/2021 1103   TRIG 135 09/16/2012 1322   HDL 69 02/22/2021 1103   HDL 60 09/16/2012 1322   CHOLHDL 2.5 02/22/2021 1103   CHOLHDL 3.2 05/06/2012 1713   VLDL 32 05/06/2012 1713   LDLCALC 78 02/22/2021 1103   LDLCALC 92 09/16/2012 1322     Clinical ASCVD: Yes   The ASCVD Risk score (Arnett DK, et al., 2019) failed to calculate for the following reasons:   The patient has a prior MI or stroke diagnosis    Other: (CHADS2VASc if Afib, PHQ9 if depression, MMRC or CAT for COPD, ACT, DEXA)    BP Readings from Last 3 Encounters:  01/14/22 135/65  12/17/21 (!) 144/88  10/14/21 (!) 168/58      SDOH:  (Social Determinants of Health) assessments and interventions performed:    Allergies  Allergen Reactions   Tape Rash    Medications Reviewed Today     Reviewed by PLavera Guise [facility](Pharmacist) on 01/16/22 at 0BallList Status: <None>   Medication Order Taking? Sig Documenting Provider Last Dose Status Informant  acetaminophen (TYLENOL) 325 MG tablet [id number] Take 2 tablets (650 mg total) by mouth every 6 (six) hours as needed for mild pain (or Fever >/= 101). ERoxan Hockey MD Taking Active Child  albuterol (VENTOLIN HFA) 108 (90 Base) MCG/ACT inhaler [id number] INHALE 1 TO 2 PUFFS BY MOUTH EVERY 6 HOURS AS NEEDED FOR WHEEZING AND  FOR SHORTNESS OF Kite, [place] A, FNP Taking Active   amLODipine (NORVASC) 5 MG tablet [id number] No Take 1 tablet by mouth once daily Evelina Dun A, FNP Taking Active   Ascorbic Acid (VITAMIN C) 1000 MG tablet [id number] No Take 1,000 mg by mouth 3 (three) times daily.  [provider] Taking Active Child  aspirin EC 81 MG tablet [id number] No Take 81 mg by mouth daily. In the morning [provider] Taking Active Child            Med Note Laurin Coder, [facility] A   Tue Mar 11, 2016 10:47 AM)    BD ULTRA-FINE PEN NEEDLES 29G X 12.7MM MISC [id number] No USE PENNEEDLES 3 TIMES  DAILY AS DIRECTED Sharion Balloon, FNP Taking Active Child  blood glucose meter kit and supplies [id number] No Dispense based on patient and insurance preference. Use up to four times daily as directed. (FOR ICD-10 E10.9, E11.9). Sharion Balloon, FNP Taking Active Child  Calcium Carbonate-Vit D-Min (CALTRATE 600+D PLUS MINERALS) 600-800 MG-UNIT TABS [id number] No Take 1 tablet by mouth 3 (three) times daily.  [provider] Taking Active Child  carvedilol (COREG) 6.25 MG tablet [id number] No Take 1 tablet by mouth twice daily Wellington Hampshire, MD Taking Active   Cholecalciferol (VITAMIN D-3) 125 MCG (5000 UT) TABS [id number] No Take 5,000 Units by mouth daily. [provider] Taking Active Child  clopidogrel (PLAVIX) 75 MG tablet [id number] No Take 1 tablet by mouth once daily Evelina Dun A, FNP Taking Active   fish oil-omega-3 fatty acids 1000 MG capsule [id number] No Take 1 g by mouth daily.  [provider] Taking Active Child  glucose blood (GLUCOSE METER TEST) test strip [id number] No Use 6-10 times a day, Uses ReliOn Hawks, Alyse Low A, FNP Taking Active Child  halobetasol (ULTRAVATE) 0.05 % cream [id number] No Apply 1 application topically daily as needed (For rash). [name], PA-C Taking Active   HUMALOG KWIKPEN 200 UNIT/ML KwikPen [id number] No INJECT 50 UNITS SUBCUTANEOUSLY THREE TIMES DAILY WITH MEALS  Patient taking differently: 10-20 Units with breakfast, with lunch, and with evening meal.   Sharion Balloon, FNP Taking Active            Med Note (CROSS, Filomena Jungling   Tue Jan 14, 2022 11:17 AM) 30 un 3 times a day   hydroxypropyl methylcellulose (ISOPTO TEARS) 2.5 % ophthalmic solution [id number] No Place 1 drop into both eyes 3 (three) times daily as needed (for dry eyes). [provider] Taking Active Child   insulin degludec (TRESIBA FLEXTOUCH) 100 UNIT/ML FlexTouch Pen [id number] No Inject 15-30 Units into the skin daily. [provider] Taking Active            Med Note Leah Friend, Kiyaan Haq D   Wed May 22, 2021 11:16 AM) VIA [employer] PATIENT ASSISTANCE  Insulin Syringe 27G X 1/2" 0.5 ML MISC [id number] No Use with Fiasp insulin TID Sharion Balloon, FNP Taking Active Child  lisinopril (ZESTRIL) 20 MG tablet [id number] No Take 1 tablet by mouth twice daily Evelina Dun A, FNP Taking Active   montelukast (SINGULAIR) 10 MG tablet [id number] No Take 1 tablet by mouth once daily with breakfast Evelina Dun A, FNP Taking Active   ondansetron (ZOFRAN ODT) 4 MG disintegrating tablet [id number] No Take 1 tablet (4 mg total) by mouth every 8 (eight) hours as needed for nausea or vomiting. Dettinger, Fransisca Kaufmann, MD Taking Active   RELION PEN NEEDLES 29G  X 12MM MISC [id number] No USE 1 NEEDLE TO INJECT INSULIN SUBCUTANEOUSLY 3 TIMES DAILY  Patient taking differently: 1 Device by Subconjunctival route 3 (three) times daily.   Sharion Balloon, FNP Taking Active   rosuvastatin (CRESTOR) 40 MG tablet [id number] No Take 1 tablet by mouth once daily Hawks, Christy A, FNP Taking Active   Semaglutide, 2 MG/DOSE, (OZEMPIC, 2 MG/DOSE,) 8 MG/3ML SOPN [id number] No Inject 2 mg into the skin once a week. [provider] Taking Active            Med Note Parthenia Ames Oct 17, 2021 10:50 AM) Via novo nordisk patient assistance program                Goals Addressed               This Visit's Progress     Patient Stated     T2DM PharmD goals (pt-stated)        Current Barriers:  Unable to independently afford treatment regimen Unable to achieve control of T2DM   Pharmacist Clinical Goal(s):  patient will verbalize ability to afford treatment regimen achieve control of T2DM as evidenced by GOAL A1C<7% through collaboration with PharmD and provider.   Interventions: 1:1 collaboration  with Sharion Balloon, FNP regarding development and update of comprehensive plan of care as evidenced by provider attestation and co-signature Inter-disciplinary care team collaboration (see longitudinal plan of care) Comprehensive medication review performed; medication list updated in electronic medical record  Diabetes: Goal on Track (progressing): YES. Uncontrolled, A1c up slightly to 7.7%--patient s/p open heart surgery May 2022;  New regimen:  1. TRESIBA increase 16 units NIGHTLY (LONG ACTING--patient refers to as the "green pen" since it has green label) 2. FIASP (SHORT ACTING/meal time INSULIN) to 20-30 UNITS WITH MEALS 3x DAILY 3. OZEMPIC 4m WEEKLY  Denies personal and family history of Medullary thyroid cancer (MTC) Current glucose readings: fasting glucose: <170, 130s per patient, post prandial glucose: n/a Consider libre pending patient preference DENIES hypoglycemia-adjusted insulin as above Current exercise: bike at home Educated on new basal insulin; new regimen; Recommend heart healthy diet/plate method given [id number] Assessed patient finances. WILL RE-Enroll in the novo nordisk patient assistance program for tresiba, fiasp, ozempic   Patient Goals/Self-Care Activities patient will:  - take medications as prescribed as evidenced by patient report and record review check glucose 4 times daily , document, and provide at future appointments collaborate with provider on medication access solutions target a minimum of 150 minutes of moderate intensity exercise weekly engage in dietary modifications by FOLLOWING A HEART HEALTHY DIET/HEALTHY PLATE METHOD          Plan: Telephone follow up appointment with care management team member scheduled for:  3 MONTHS     JRegina Eck PharmD, BCPS, BCACP Clinical Pharmacist, [place] Phone [phone]

## 2022-01-16 NOTE — Telephone Encounter (Signed)
Please re-enroll for:  Ozempic '2mg'$  weekly Tresiba 30 units daily Pen needles Fiasp 20 units 3 times daily with meals  Thank you!

## 2022-02-11 ENCOUNTER — Other Ambulatory Visit: Payer: Self-pay | Admitting: Family

## 2022-02-14 ENCOUNTER — Telehealth: Payer: Self-pay | Admitting: Family

## 2022-02-18 ENCOUNTER — Other Ambulatory Visit (HOSPITAL_COMMUNITY): Payer: Self-pay

## 2022-02-18 ENCOUNTER — Telehealth: Payer: Self-pay | Admitting: Family

## 2022-02-18 NOTE — Telephone Encounter (Signed)
Returned pt phone call. Pt looking for ozempic order since she used her last shot this past weekend.  Informed her I dont see a note regarding shipment yet but the office will call her once it's in. She said she still wants to stop the office tomorrow for samples, but I also informed her there most likely wasn't any in that strength. Encouraged her to pickup at the pharmacy if she needs it immediately (copay $47) but she said she cannot afford that. Also let pt know her re-enrollment is still pending.

## 2022-02-20 NOTE — Telephone Encounter (Signed)
Please let patient know all I have is ozempic '1mg'$   But sample is in [facility] fridge for pick up Thank you!

## 2022-02-20 NOTE — Telephone Encounter (Signed)
Patient aware and verbalized understanding. °

## 2022-02-21 NOTE — Telephone Encounter (Signed)
Please let patient know sample is in fridge for pick up (she may have already gotten this) All I had was a '1mg'$  box of Ozempic Thanks!

## 2022-02-21 NOTE — Telephone Encounter (Signed)
Patient aware and verbalized understanding. °

## 2022-02-24 ENCOUNTER — Other Ambulatory Visit: Payer: Self-pay | Admitting: Family

## 2022-02-24 DIAGNOSIS — E1159 Type 2 diabetes mellitus with other circulatory complications: Secondary | ICD-10-CM

## 2022-02-28 ENCOUNTER — Telehealth: Payer: Self-pay

## 2022-02-28 NOTE — Telephone Encounter (Signed)
Ozempic received from patient assistance.  Patient aware and will pick up.

## 2022-03-18 ENCOUNTER — Other Ambulatory Visit: Payer: Self-pay | Admitting: Family

## 2022-03-18 ENCOUNTER — Encounter: Payer: Self-pay | Admitting: Cardiovascular Disease

## 2022-03-18 ENCOUNTER — Ambulatory Visit: Payer: Medicare Other | Attending: Cardiovascular Disease | Admitting: Cardiovascular Disease

## 2022-03-18 VITALS — BP 140/64 | HR 71 | Ht 62.5 in | Wt 204.6 lb

## 2022-03-18 DIAGNOSIS — I739 Peripheral vascular disease, unspecified: Secondary | ICD-10-CM

## 2022-03-18 DIAGNOSIS — E785 Hyperlipidemia, unspecified: Secondary | ICD-10-CM

## 2022-03-18 DIAGNOSIS — I150 Renovascular hypertension: Secondary | ICD-10-CM | POA: Diagnosis not present

## 2022-03-18 DIAGNOSIS — I2510 Atherosclerotic heart disease of native coronary artery without angina pectoris: Secondary | ICD-10-CM

## 2022-03-18 DIAGNOSIS — R0989 Other specified symptoms and signs involving the circulatory and respiratory systems: Secondary | ICD-10-CM

## 2022-03-18 DIAGNOSIS — E1169 Type 2 diabetes mellitus with other specified complication: Secondary | ICD-10-CM

## 2022-03-18 NOTE — Progress Notes (Signed)
Cardiology Office Note   Date:  03/18/2022   ID:  Zniya, Cottone [birth year], MRN [id number]  PCP:  Sharion Balloon, FNP  Cardiologist: Dr. Fletcher [name]   No chief complaint on file.    History of Present Illness: Leah Ferrell is a 72 y.o. female who presents for a followup visit regarding peripheral arterial disease and coronary artery disease.  She has known history of coronary artery disease status post drug-eluting stent placement to the LAD X 2 with subsequent CABG in May 2022. She is known to have peripheral arterial disease status post left common iliac artery stent placement followed by left common femoral artery resection and bypass from left external iliac to the left profunda done by Dr. Kellie Simmering in 2015. She is known to have chronically occluded bilateral SFAs being managed medically.  She had worsening left leg claudication in 2018 with elevated velocity at the proximal anastomosis of the graft.   Angiography in March 2018 showed mild to moderate calcified common iliac artery disease with patent stent in the left common iliac artery. There was severe stenosis in the left common femoral artery at the proximal anastomosis of the bypass to the profunda. I performed successful angioplasty and drug-coated balloon angioplasty.   She is status post bilateral renal artery stenting in January 2021 due to refractory hypertension and recurrent heart failure.  She had significant improvement in blood pressure control as well as heart failure symptoms since then.    Lower extremity angiography in March of 2022 showed patent renal artery stents with significant restenosis on the right side, severe bilateral heavily calcified ostial common iliac artery disease extending into the distal aorta.  The iliac arteries were treated by successful kissing stent placement.  On the right side, there was moderate common femoral artery stenosis as well as chronically occluded SFA with reconstitution  distally via collaterals from the profunda, short occlusion of above-the-knee popliteal artery with collaterals and three-vessel runoff below the knee.  On the left side, there was flush occlusion of the SFA with collaterals from the profunda and two-vessel runoff below the knee. She underwent CABG in May, 2022 with LIMA to LAD, SVG to OM and SVG to diagonal.  She was seen recently for worsening bilateral calf claudication with no rest pain or ulceration.  Arterial Doppler studies showed a decrease in ABI to the 0.4 range bilaterally.  Her ABIs were in the 0.5 range before.  Duplex was overall limited due to heavy calcifications of the iliac arteries.  She was noted to have moderately elevated velocities in the external iliac arteries. I instructed her to exercise on the treadmill daily and she has been doing that and reports improvement in claudication overall.  She does have a lot of numbness in her feet likely due to neuropathy. No chest pain or shortness of breath.   Past Medical History:  Diagnosis Date   CAD (coronary artery disease)    a. 04/2012 NSTEMI: s/p DES to LAD.   Chronic diastolic CHF (congestive heart failure) (HCC)    Diabetes mellitus without complication (HCC)    Dysrhythmia    Environmental and seasonal allergies    Hyperlipidemia    Hypertension    Leukocytosis    Morbid obesity (HCC)    NSTEMI (non-ST elevated myocardial infarction) ([place]) 04/27/2012   PONV (postoperative nausea and vomiting)    PVD (peripheral vascular disease) ([place])    a. 04/2012 ABI: R 0.49, L 0.39. Angiography 06/14: Significant ostial left  common iliac artery stenosis extending into the distal aorta, severe left common femoral artery stenosis, bilateral SFA occlusion with heavy calcifications. Functionally, one-vessel runoff bilaterally below the knee   Renal artery stenosis (HCC)    s/p angioplasty/stenting bilateral renal arteries 03/16/19   Shortness of breath     Past Surgical History:   Procedure Laterality Date   ABDOMINAL AORTAGRAM N/A 07/21/2012   Procedure: ABDOMINAL Maxcine Ham;  Surgeon: Wellington Hampshire, MD;  Location: [facility] CATH LAB;  Service: Cardiovascular;  Laterality: N/A;   ABDOMINAL AORTOGRAM W/LOWER EXTREMITY N/A 05/09/2020   Procedure: ABDOMINAL AORTOGRAM W/LOWER EXTREMITY;  Surgeon: Wellington Hampshire, MD;  Location: [facility] CV LAB;  Service: Cardiovascular;  Laterality: N/A;   CARDIAC CATHETERIZATION     stents X 2   CORONARY ANGIOGRAM  04/27/2012   Procedure: CORONARY ANGIOGRAM;  Surgeon: Thayer Headings, MD;  Location: [facility] CATH LAB;  Service: Cardiovascular;;   CORONARY ARTERY BYPASS GRAFT N/A 06/25/2020   Procedure: CORONARY ARTERY BYPASS GRAFTING (CABG)X3. LEFT INTERNAL MAMMARY ARTERY. RIGHT ENDOSCOPIC SAPHENOUS VEIN HARVESTING.;  Surgeon: Wonda Olds, MD;  Location: [place];  Service: Open Heart Surgery;  Laterality: N/A;   ENDARTERECTOMY FEMORAL Left 11/02/2013   Procedure: RESECTION LEFT COMMON FEMORAL ARTERY AND INSERTION OF INTERPOSITION 34m HEMASHIELD GRAFT FROM LEFT EXTERNAL  ILIAC ARTERY TO LEFT PROFUNDA  FEMORIS ARTERY;  Surgeon: JMal Misty MD;  [place]  Service: Vascular;  Laterality: Left;   EYE SURGERY Right 2017   FRACTURE SURGERY Right Jul 07, 2014   Right [place]-  [place]  by Heat duct   ILIAC ARTERY STENT Left 08/31/2013   LAD stent     LEFT HEART CATH AND CORONARY ANGIOGRAPHY N/A 05/09/2020   Procedure: LEFT HEART CATH AND CORONARY ANGIOGRAPHY;  Surgeon: AWellington Hampshire MD;  [facility];  Service: Cardiovascular;  Laterality: N/A;   LEFT HEART CATHETERIZATION WITH CORONARY ANGIOGRAM N/A 04/23/2012   Procedure: LEFT HEART CATHETERIZATION WITH CORONARY ANGIOGRAM;  Surgeon: Peter M JMartinique MD;  [facility];  Service: Cardiovascular;  Laterality: N/A;   LOWER EXTREMITY ANGIOGRAM Left 08/31/2013   Procedure: LOWER EXTREMITY ANGIOGRAM;  Surgeon: MWellington Hampshire MD;  [facility];  Service:  Cardiovascular;  Laterality: Left;   PERCUTANEOUS STENT INTERVENTION Left 08/31/2013   Procedure: PERCUTANEOUS STENT INTERVENTION;  Surgeon: MWellington Hampshire MD;  [facility];  Service: Cardiovascular;  Laterality: Left;  Common Iliac artery   PERIPHERAL VASCULAR CATHETERIZATION N/A 03/19/2016   Procedure: Abdominal Aortogram w/Lower Extremity;  Surgeon: MWellington Hampshire MD;  [facility];  Service: Cardiovascular;  Laterality: N/A;   PERIPHERAL VASCULAR CATHETERIZATION  03/19/2016   Procedure: Peripheral Vascular Balloon Angioplasty-CFA Left;  Surgeon: MWellington Hampshire MD;  [facility];  Service: Cardiovascular;;   PERIPHERAL VASCULAR INTERVENTION Bilateral 03/16/2019   Procedure: PERIPHERAL VASCULAR INTERVENTION;  Surgeon: AWellington Hampshire MD;  [facility];  Service: Cardiovascular;  Laterality: Bilateral;  RENAL   PERIPHERAL VASCULAR INTERVENTION Bilateral 05/09/2020   Procedure: PERIPHERAL VASCULAR INTERVENTION;  Surgeon: AWellington Hampshire MD;  [facility];  Service: Cardiovascular;  Laterality: Bilateral;  Iliac   RENAL ANGIOGRAPHY  03/16/2019   RENAL ANGIOGRAPHY Bilateral 03/16/2019   Procedure: RENAL ANGIOGRAPHY;  Surgeon: AWellington Hampshire MD;  [facility];  Service: Cardiovascular;  Laterality: Bilateral;   TEE WITHOUT CARDIOVERSION N/A 06/25/2020   Procedure:  TRANSESOPHAGEAL ECHOCARDIOGRAM (TEE);  Surgeon: Wonda Olds, MD;  Location: [facility];  Service: Open Heart Surgery;  Laterality: N/A;   TUMOR REMOVAL     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 20 tablet 0   albuterol (VENTOLIN HFA) 108 (90 Base) MCG/ACT inhaler INHALE 1 TO 2 PUFFS BY MOUTH EVERY 6 HOURS AS NEEDED FOR WHEEZING AND FOR  SHORTNESS OF BREATH 18 g 0   amLODipine (NORVASC) 5 MG tablet Take 1 tablet by mouth once daily 90 tablet 0   Ascorbic Acid (VITAMIN C) 1000 MG tablet Take 1,000 mg by mouth 3 (three) times daily.      aspirin EC 81 MG tablet Take 81 mg by mouth daily. In the morning     BD ULTRA-FINE PEN NEEDLES 29G X 12.7MM MISC USE PENNEEDLES 3 TIMES  DAILY AS DIRECTED 270 each 1   blood glucose meter kit and supplies Dispense based on patient and insurance preference. Use up to four times daily as directed. (FOR ICD-10 E10.9, E11.9). 1 each 0   Calcium Carbonate-Vit D-Min (CALTRATE 600+D PLUS MINERALS) 600-800 MG-UNIT TABS Take 1 tablet by mouth 3 (three) times daily.      carvedilol (COREG) 6.25 MG tablet Take 1 tablet by mouth twice daily 180 tablet 1   Cholecalciferol (VITAMIN D-3) 125 MCG (5000 UT) TABS Take 5,000 Units by mouth daily.     clopidogrel (PLAVIX) 75 MG tablet Take 1 tablet by mouth once daily 90 tablet 0   fish oil-omega-3 fatty acids 1000 MG capsule Take 1 g by mouth daily.      glucose blood (GLUCOSE METER TEST) test strip Use 6-10 times a day, Uses ReliOn 900 each 12   halobetasol (ULTRAVATE) 0.05 % cream Apply 1 application topically daily as needed (For rash).     HUMALOG KWIKPEN 200 UNIT/ML KwikPen INJECT 50 UNITS SUBCUTANEOUSLY THREE TIMES DAILY WITH MEALS (Patient taking differently: 10-20 Units with breakfast, with lunch, and with evening meal.) 18 mL 0   hydroxypropyl methylcellulose (ISOPTO TEARS) 2.5 % ophthalmic solution Place 1 drop into both eyes 3 (three) times daily as needed (for dry eyes).     insulin degludec (TRESIBA FLEXTOUCH) 100 UNIT/ML FlexTouch Pen Inject 15-30 Units into the skin daily.     Insulin Syringe 27G X 1/2" 0.5 ML MISC Use with Fiasp insulin TID 100 each 3   lisinopril (ZESTRIL) 20 MG tablet Take 1 tablet by mouth twice daily 180 tablet 0   montelukast (SINGULAIR) 10 MG tablet Take 1 tablet by mouth once daily with breakfast 90 tablet 1   ondansetron  (ZOFRAN ODT) 4 MG disintegrating tablet Take 1 tablet (4 mg total) by mouth every 8 (eight) hours as needed for nausea or vomiting. 20 tablet 0   RELION PEN NEEDLES 29G X 12MM MISC USE 1 NEEDLE TO INJECT INSULIN SUBCUTANEOUSLY 3 TIMES DAILY (Patient taking differently: 1 Device by Subconjunctival route 3 (three) times daily.) 50 each 5   rosuvastatin (CRESTOR) 40 MG tablet Take 1 tablet by mouth once daily 90 tablet 0   Semaglutide, 2 MG/DOSE, (OZEMPIC, 2 MG/DOSE,) 8 MG/3ML SOPN Inject 2 mg into the skin once a week.     No current facility-administered medications for this visit.    Allergies:   Tape  Social History:  The patient  reports that she quit smoking about 21 years ago. Her smoking use included cigarettes. She started smoking about 57 years ago. She has a 36.00 pack-year smoking history. She has never used smokeless tobacco. She reports that she does not drink alcohol and does not use drugs.   Family History:  The patient's family history includes Alcohol abuse in her brother; Diabetes in her brother, daughter, maternal grandmother, mother, and son; Heart attack in her maternal grandfather; Heart disease in her brother; Hyperlipidemia in her mother and son; Hypertension in her daughter, mother, and son; Other in her sister and sister; Peripheral vascular disease in her mother.    ROS:  Please see the history of present illness.   Otherwise, review of systems are positive for none.   All other systems are reviewed and negative.    PHYSICAL EXAM: VS:  BP (!) 140/64 (BP Location: Left Arm, Patient Position: Sitting, Cuff Size: Large)   Pulse 71   Ht 5' 2.5" (1.588 m)   Wt 204 lb 9.6 oz (92.8 kg)   SpO2 99%   BMI 36.83 kg/m  , BMI Body mass index is 36.83 kg/m. GEN: Well nourished, well developed, in no acute distress  HEENT: normal  Neck: no JVD or masses.  Right carotid bruit. Cardiac: RRR; no  rubs, or gallops , trace edema . There is 2/6 systolic ejection murmur in the  aortic area.  Respiratory:  clear to auscultation bilaterally, normal work of breathing GI: soft, nontender, nondistended, + BS MS: no deformity or atrophy  Skin: warm and dry, no rash Neuro:  Strength and sensation are intact Psych: euthymic mood, full affect Distal pulses are not palpable.    EKG:  EKG is ordered today. EKG shows normal sinus rhythm with lateral ST changes suggestive of ischemia.   Recent Labs: 10/14/2021: Hemoglobin 14.4; Platelets 275 01/14/2022: ALT 23; BUN 11; Creatinine, Ser 0.75; Potassium 4.7; Sodium 138    Lipid Panel    Component Value Date/Time   CHOL 171 02/22/2021 1103   TRIG 140 02/22/2021 1103   TRIG 135 09/16/2012 1322   HDL 69 02/22/2021 1103   HDL 60 09/16/2012 1322   CHOLHDL 2.5 02/22/2021 1103   CHOLHDL 3.2 05/06/2012 1713   VLDL 32 05/06/2012 1713   LDLCALC 78 02/22/2021 1103   LDLCALC 92 09/16/2012 1322      Wt Readings from Last 3 Encounters:  03/18/22 204 lb 9.6 oz (92.8 kg)  01/14/22 200 lb (90.7 kg)  12/23/21 202 lb (91.6 kg)           No data to display            ASSESSMENT AND PLAN:  1. Peripheral arterial disease: Status post  kissing stent placement to the common iliac arteries due to severe right calf claudication as well as nonhealing ulceration on the right small toe.  Her ABI is severely reduced to 0.4 range but her claudication improved with an exercise program.  I asked her to continue 30 minutes of walking daily.  She does that on the treadmill most days of the week.  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  She does have evidence of significant restenosis on the right side.  Her blood pressure is reasonably controlled with medications. Repeat blood pressure with a large cuff was 140/64 in the left arm.  3. Coronary artery disease involving native  coronary arteries without angina: Symptoms resolved after CABG and she is doing well.  .      4. Hyperlipidemia: Continue high-dose rosuvastatin.  I reviewed most recent lipid profile done in January which showed an LDL of 78.  5.  Right carotid bruit: I requested carotid Doppler.   Disposition:   FU with me in 6 month  Signed,  [name], MD  03/18/2022 9:21 AM    [place]

## 2022-03-18 NOTE — Patient Instructions (Signed)
Medication Instructions:  No changes *If you need a refill on your cardiac medications before your next appointment, please call your pharmacy*   Lab Work: None ordered If you have labs (blood work) drawn today and your tests are completely normal, you will receive your results only by: [place] (if you have MyChart) OR A paper copy in the mail If you have any lab test that is abnormal or we need to change your treatment, we will call you to review the results.   Testing/Procedures: Your physician has requested that you have a carotid duplex. This test is an ultrasound of the carotid arteries in your neck. It looks at blood flow through these arteries that supply the brain with blood. Allow one hour for this exam. There are no restrictions or special instructions. This will take place at [place], [address].    Follow-Up: At [facility], you and your health needs are our priority.  As part of our continuing mission to provide you with exceptional heart care, we have created designated Provider Care Teams.  These Care Teams include your primary Cardiologist (physician) and Advanced Practice Providers (APPs -  Physician Assistants and Nurse Practitioners) who all work together to provide you with the care you need, when you need it.  We recommend signing up for the patient portal called "MyChart".  Sign up information is provided on this After Visit Summary.  MyChart is used to connect with patients for Virtual Visits (Telemedicine).  Patients are able to view lab/test results, encounter notes, upcoming appointments, etc.  Non-urgent messages can be sent to your provider as well.   To learn more about what you can do with MyChart, go to [email or web address].    Your next appointment:   6 month(s)  Provider:   [name], MD

## 2022-04-07 ENCOUNTER — Ambulatory Visit: Payer: Medicare Other | Attending: Cardiovascular Disease

## 2022-04-07 DIAGNOSIS — R0989 Other specified symptoms and signs involving the circulatory and respiratory systems: Secondary | ICD-10-CM

## 2022-04-08 ENCOUNTER — Other Ambulatory Visit: Payer: Self-pay | Admitting: Family

## 2022-04-08 ENCOUNTER — Other Ambulatory Visit: Payer: Self-pay | Admitting: Cardiovascular Disease

## 2022-04-08 DIAGNOSIS — I152 Hypertension secondary to endocrine disorders: Secondary | ICD-10-CM

## 2022-04-08 NOTE — Telephone Encounter (Signed)
Refill request

## 2022-04-10 ENCOUNTER — Encounter: Payer: Self-pay | Admitting: *Deleted

## 2022-04-17 ENCOUNTER — Ambulatory Visit (INDEPENDENT_AMBULATORY_CARE_PROVIDER_SITE_OTHER): Payer: Medicare Other | Admitting: Family

## 2022-04-17 ENCOUNTER — Encounter: Payer: Self-pay | Admitting: Family

## 2022-04-17 VITALS — BP 151/69 | HR 63 | Temp 97.5°F | Ht 62.5 in | Wt 201.2 lb

## 2022-04-17 DIAGNOSIS — I110 Hypertensive heart disease with heart failure: Secondary | ICD-10-CM | POA: Diagnosis not present

## 2022-04-17 DIAGNOSIS — I152 Hypertension secondary to endocrine disorders: Secondary | ICD-10-CM | POA: Diagnosis not present

## 2022-04-17 DIAGNOSIS — I2510 Atherosclerotic heart disease of native coronary artery without angina pectoris: Secondary | ICD-10-CM | POA: Diagnosis not present

## 2022-04-17 DIAGNOSIS — E1159 Type 2 diabetes mellitus with other circulatory complications: Secondary | ICD-10-CM | POA: Diagnosis not present

## 2022-04-17 DIAGNOSIS — I252 Old myocardial infarction: Secondary | ICD-10-CM | POA: Diagnosis not present

## 2022-04-17 DIAGNOSIS — I5032 Chronic diastolic (congestive) heart failure: Secondary | ICD-10-CM | POA: Diagnosis not present

## 2022-04-17 DIAGNOSIS — I739 Peripheral vascular disease, unspecified: Secondary | ICD-10-CM

## 2022-04-17 DIAGNOSIS — E1151 Type 2 diabetes mellitus with diabetic peripheral angiopathy without gangrene: Secondary | ICD-10-CM

## 2022-04-17 DIAGNOSIS — Z6836 Body mass index (BMI) 36.0-36.9, adult: Secondary | ICD-10-CM

## 2022-04-17 LAB — BAYER DCA HB A1C WAIVED: HB A1C (BAYER DCA - WAIVED): 8.9 % — ABNORMAL HIGH (ref 4.8–5.6)

## 2022-04-17 NOTE — Patient Instructions (Signed)
Hypertension, Adult High blood pressure (hypertension) is when the force of blood pumping through the arteries is too strong. The arteries are the blood vessels that carry blood from the heart throughout the body. Hypertension forces the heart to work harder to pump blood and may cause arteries to become narrow or stiff. Untreated or uncontrolled hypertension can lead to a heart attack, heart failure, a stroke, kidney disease, and other problems. A blood pressure reading consists of a higher number over a lower number. Ideally, your blood pressure should be below 120/80. The first ("top") number is called the systolic pressure. It is a measure of the pressure in your arteries as your heart beats. The second ("bottom") number is called the diastolic pressure. It is a measure of the pressure in your arteries as the heart relaxes. What are the causes? The exact cause of this condition is not known. There are some conditions that result in high blood pressure. What increases the risk? Certain factors may make you more likely to develop high blood pressure. Some of these risk factors are under your control, including: Smoking. Not getting enough exercise or physical activity. Being overweight. Having too much fat, sugar, calories, or salt (sodium) in your diet. Drinking too much alcohol. Other risk factors include: Having a personal history of heart disease, diabetes, high cholesterol, or kidney disease. Stress. Having a family history of high blood pressure and high cholesterol. Having obstructive sleep apnea. Age. The risk increases with age. What are the signs or symptoms? High blood pressure may not cause symptoms. Very high blood pressure (hypertensive crisis) may cause: Headache. Fast or irregular heartbeats (palpitations). Shortness of breath. Nosebleed. Nausea and vomiting. Vision changes. Severe chest pain, dizziness, and seizures. How is this diagnosed? This condition is diagnosed by  measuring your blood pressure while you are seated, with your arm resting on a flat surface, your legs uncrossed, and your feet flat on the floor. The cuff of the blood pressure monitor will be placed directly against the skin of your upper arm at the level of your heart. Blood pressure should be measured at least twice using the same arm. Certain conditions can cause a difference in blood pressure between your right and left arms. If you have a high blood pressure reading during one visit or you have normal blood pressure with other risk factors, you may be asked to: Return on a different day to have your blood pressure checked again. Monitor your blood pressure at home for 1 week or longer. If you are diagnosed with hypertension, you may have other blood or imaging tests to help your health care provider understand your overall risk for other conditions. How is this treated? This condition is treated by making healthy lifestyle changes, such as eating healthy foods, exercising more, and reducing your alcohol intake. You may be referred for counseling on a healthy diet and physical activity. Your health care provider may prescribe medicine if lifestyle changes are not enough to get your blood pressure under control and if: Your systolic blood pressure is above 130. Your diastolic blood pressure is above 80. Your personal target blood pressure may vary depending on your medical conditions, your age, and other factors. Follow these instructions at home: Eating and drinking  Eat a diet that is high in fiber and potassium, and low in sodium, added sugar, and fat. An example of this eating plan is called the DASH diet. DASH stands for Dietary Approaches to Stop Hypertension. To eat this way: Eat   plenty of fresh fruits and vegetables. Try to fill one half of your plate at each meal with fruits and vegetables. Eat whole grains, such as whole-wheat pasta, brown rice, or whole-grain bread. Fill about one  fourth of your plate with whole grains. Eat or drink low-fat dairy products, such as skim milk or low-fat yogurt. Avoid fatty cuts of meat, processed or cured meats, and poultry with skin. Fill about one fourth of your plate with lean proteins, such as fish, chicken without skin, beans, eggs, or tofu. Avoid pre-made and processed foods. These tend to be higher in sodium, added sugar, and fat. Reduce your daily sodium intake. Many people with hypertension should eat less than 1,500 mg of sodium a day. Do not drink alcohol if: Your health care provider tells you not to drink. You are pregnant, may be pregnant, or are planning to become pregnant. If you drink alcohol: Limit how much you have to: 0-1 drink a day for women. 0-2 drinks a day for men. Know how much alcohol is in your drink. In the [place] one drink equals one 12 oz bottle of beer (355 mL), one 5 oz glass of wine (148 mL), or one 1 oz glass of hard liquor (44 mL). Lifestyle  Work with your health care provider to maintain a healthy body weight or to lose weight. Ask what an ideal weight is for you. Get at least 30 minutes of exercise that causes your heart to beat faster (aerobic exercise) most days of the week. Activities may include walking, swimming, or biking. Include exercise to strengthen your muscles (resistance exercise), such as Pilates or lifting weights, as part of your weekly exercise routine. Try to do these types of exercises for 30 minutes at least 3 days a week. Do not use any products that contain nicotine or tobacco. These products include cigarettes, chewing tobacco, and vaping devices, such as e-cigarettes. If you need help quitting, ask your health care provider. Monitor your blood pressure at home as told by your health care provider. Keep all follow-up visits. This is important. Medicines Take over-the-counter and prescription medicines only as told by your health care provider. Follow directions carefully. Blood  pressure medicines must be taken as prescribed. Do not skip doses of blood pressure medicine. Doing this puts you at risk for problems and can make the medicine less effective. Ask your health care provider about side effects or reactions to medicines that you should watch for. Contact a health care provider if you: Think you are having a reaction to a medicine you are taking. Have headaches that keep coming back (recurring). Feel dizzy. Have swelling in your ankles. Have trouble with your vision. Get help right away if you: Develop a severe headache or confusion. Have unusual weakness or numbness. Feel faint. Have severe pain in your chest or abdomen. Vomit repeatedly. Have trouble breathing. These symptoms may be an emergency. Get help right away. Call 911. Do not wait to see if the symptoms will go away. Do not drive yourself to the hospital. Summary Hypertension is when the force of blood pumping through your arteries is too strong. If this condition is not controlled, it may put you at risk for serious complications. Your personal target blood pressure may vary depending on your medical conditions, your age, and other factors. For most people, a normal blood pressure is less than 120/80. Hypertension is treated with lifestyle changes, medicines, or a combination of both. Lifestyle changes include losing weight, eating a healthy,   low-sodium diet, exercising more, and limiting alcohol. This information is not intended to replace advice given to you by your health care provider. Make sure you discuss any questions you have with your health care provider. Document Revised: 12/11/2020 Document Reviewed: 12/11/2020 Elsevier Patient Education  2023 [employer]  

## 2022-04-17 NOTE — Progress Notes (Addendum)
Subjective:    Patient ID: Leah Ferrell, female    DOB: [birth year], 72 y.o.   MRN: [id number]  Chief Complaint  Patient presents with   Medical Management of Chronic Issues   Pt presents to the office today for  chronic follow up. Pt has CHF, CAD and hx MI and is followed by Cardiologists every 6 months. PT is followed by Vascular  for PVD and PAD. PT states these are stable.    Pt had a renal angiography on 03/16/19. She reports her swelling is improved and her BP is improved.     She had critical lower limb ischemia and had abdominal aortogram with lower extremity. 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    Pt is morbid obese because her BMI is 36 and has DM and HTN.  Hypertension This is a chronic problem. The current episode started more than 1 year ago. The problem has been waxing and waning since onset. The problem is uncontrolled. Associated symptoms include blurred vision, malaise/fatigue and peripheral edema. Pertinent negatives include no shortness of breath. Risk factors for coronary artery disease include diabetes mellitus, dyslipidemia, obesity and sedentary lifestyle. The current treatment provides moderate improvement.  Diabetes She presents for her follow-up diabetic visit. She has type 2 diabetes mellitus. Associated symptoms include blurred vision, fatigue and foot paresthesias. Symptoms are stable. Diabetic complications include heart disease, nephropathy and peripheral neuropathy. Risk factors for coronary artery disease include dyslipidemia, diabetes mellitus, hypertension, sedentary lifestyle and post-menopausal. She is following a generally unhealthy diet. Her overall blood glucose range is >200 mg/dl.  Congestive Heart Failure Presents for follow-up visit. Associated symptoms include edema and fatigue. Pertinent negatives include no shortness of breath. The symptoms have been stable.      Review of Systems  Constitutional:  Positive for fatigue and malaise/fatigue.  Eyes:   Positive for blurred vision.  Respiratory:  Negative for shortness of breath.   All other systems reviewed and are negative.   Family History  Problem Relation Age of Onset   Diabetes Mother    Hyperlipidemia Mother    Hypertension Mother    Peripheral vascular disease Mother    Other Sister        drowned   Other Sister        drowned   Diabetes Daughter        borderline   Hypertension Daughter    Diabetes Maternal Grandmother    Heart attack Maternal Grandfather    Heart disease Brother    Diabetes Brother    Alcohol abuse Brother    Diabetes Son    Hypertension Son    Hyperlipidemia Son    Breast cancer Neg Hx    Social History   Socioeconomic History   Marital status: Widowed    Spouse name: Not on file   Number of children: 4   Years of education: completed school in [place]    Highest education level: Not on file  Occupational History   Occupation: retired  Tobacco Use   Smoking status: Former    Packs/day: 1.00    Years: 36.00    Total pack years: 36.00    Types: Cigarettes    Start date: 04/21/1964    Quit date: 02/17/2001    Years since quitting: 21.1   Smokeless tobacco: Never  Vaping Use   Vaping Use: Never used  Substance and Sexual Activity   Alcohol use: No    Alcohol/week: 0.0 standard drinks of alcohol   Drug  use: No   Sexual activity: Not Currently    Birth control/protection: Post-menopausal  Other Topics Concern   Not on file  Social History Narrative   Lives alone - one level - all of her children live far away   Social Determinants of Health   Financial Resource Strain: Low Risk  (12/23/2021)   Overall Financial Resource Strain (CARDIA)    Difficulty of Paying Living Expenses: Not hard at all  Food Insecurity: No Food Insecurity (12/23/2021)   Hunger Vital Sign    Worried About Running Out of Food in the Last Year: Never true    Ran Out of Food in the Last Year: Never true  Transportation Needs: No Transportation Needs  (12/23/2021)   PRAPARE - Administrator, Civil Service (Medical): No    Lack of Transportation (Non-Medical): No  Physical Activity: Insufficiently Active (12/23/2021)   Exercise Vital Sign    Days of Exercise per Week: 3 days    Minutes of Exercise per Session: 30 min  Stress: No Stress Concern Present (12/23/2021)   [employer] of Occupational Health - Occupational Stress Questionnaire    Feeling of Stress : Not at all  Social Connections: Moderately Isolated (12/23/2021)   Social Connection and Isolation Panel [NHANES]    Frequency of Communication with Friends and Family: More than three times a week    Frequency of Social Gatherings with Friends and Family: More than three times a week    Attends Religious Services: More than 4 times per year    Active Member of [employer] or Organizations: No    Attends Banker Meetings: Never    Marital Status: Widowed       Objective:   Physical Exam Vitals reviewed.  Constitutional:      General: She is not in acute distress.    Appearance: She is well-developed. She is obese.  HENT:     Head: Normocephalic and atraumatic.     Right Ear: Tympanic membrane normal.     Left Ear: Tympanic membrane normal.  Eyes:     Pupils: Pupils are equal, round, and reactive to light.  Neck:     Thyroid: No thyromegaly.  Cardiovascular:     Rate and Rhythm: Normal rate and regular rhythm.     Heart sounds: Normal heart sounds. No murmur heard. Pulmonary:     Effort: Pulmonary effort is normal. No respiratory distress.     Breath sounds: Normal breath sounds. No wheezing.  Abdominal:     General: Bowel sounds are normal. There is no distension.     Palpations: Abdomen is soft.     Tenderness: There is no abdominal tenderness.  Musculoskeletal:        General: No tenderness. Normal range of motion.     Cervical back: Normal range of motion and neck supple.     Right lower leg: Edema (trace) present.     Left lower leg:  Edema (trace) present.  Skin:    General: Skin is warm and dry.  Neurological:     Mental Status: She is alert and oriented to person, place, and time.     Cranial Nerves: No cranial nerve deficit.     Deep Tendon Reflexes: Reflexes are normal and symmetric.  Psychiatric:        Behavior: Behavior normal.        Thought Content: Thought content normal.        Judgment: Judgment normal.  BP (!) 163/63   Pulse 63   Temp (!) 97.5 F (36.4 C) (Temporal)   Ht 5' 2.5" (1.588 m)   Wt 201 lb 3.2 oz (91.3 kg)   SpO2 96%   BMI 36.21 kg/m      Assessment & Plan:  Leah Ferrell comes in today with chief complaint of Medical Management of Chronic Issues   Diagnosis and orders addressed:  1. Hypertension associated with diabetes (HCC) - CMP14+EGFR - CBC with Differential/Platelet  2. Diabetes mellitus with peripheral vascular disease (HCC) - CMP14+EGFR - CBC with Differential/Platelet - Bayer DCA Hb A1c Waived  3. Hx of non-ST elevation myocardial infarction (NSTEMI) - CMP14+EGFR - CBC with Differential/Platelet  4. Coronary artery disease involving native coronary artery of native heart without angina pectoris - CMP14+EGFR - CBC with Differential/Platelet  5. PVD (peripheral vascular disease) (HCC) - CMP14+EGFR - CBC with Differential/Platelet  6. Chronic diastolic heart failure (HCC) - CMP14+EGFR - CBC with Differential/Platelet  7. PAD (peripheral artery disease) (HCC) - CMP14+EGFR - CBC with Differential/Platelet  8. Morbid obesity (HCC) - CMP14+EGFR - CBC with Differential/Platelet   Labs pending Health Maintenance reviewed Diet and exercise encouraged  Follow up plan: 3 months    Jannifer Rodney, FNP

## 2022-04-18 ENCOUNTER — Other Ambulatory Visit: Payer: Self-pay | Admitting: Family

## 2022-04-18 LAB — CMP14+EGFR
ALT: 18 IU/L (ref 0–32)
AST: 16 IU/L (ref 0–40)
Albumin/Globulin Ratio: 1.4 (ref 1.2–2.2)
Albumin: 4.4 g/dL (ref 3.9–4.9)
Alkaline Phosphatase: 61 IU/L (ref 44–121)
BUN/Creatinine Ratio: 12 (ref 12–28)
BUN: 9 mg/dL (ref 8–27)
Bilirubin Total: 0.3 mg/dL (ref 0.0–1.2)
CO2: 30 mmol/L — ABNORMAL HIGH (ref 20–29)
Calcium: 10 mg/dL (ref 8.7–10.3)
Chloride: 96 mmol/L (ref 96–106)
Creatinine, Ser: 0.76 mg/dL (ref 0.57–1.00)
Globulin, Total: 3.1 g/dL (ref 1.5–4.5)
Glucose: 211 mg/dL — ABNORMAL HIGH (ref 70–99)
Potassium: 4.6 mmol/L (ref 3.5–5.2)
Sodium: 139 mmol/L (ref 134–144)
Total Protein: 7.5 g/dL (ref 6.0–8.5)
eGFR: 84 mL/min/{1.73_m2} (ref >59)

## 2022-04-18 LAB — CBC WITH DIFFERENTIAL/PLATELET
Basophils Absolute: 0.1 10*3/uL (ref 0.0–0.2)
Basos: 1 %
EOS (ABSOLUTE): 0.2 10*3/uL (ref 0.0–0.4)
Eos: 2 %
Hematocrit: 46.3 % (ref 34.0–46.6)
Hemoglobin: 15.4 g/dL (ref 11.1–15.9)
Immature Grans (Abs): 0 10*3/uL (ref 0.0–0.1)
Immature Granulocytes: 0 %
Lymphocytes Absolute: 3.1 10*3/uL (ref 0.7–3.1)
Lymphs: 27 %
MCH: 31.6 pg (ref 26.6–33.0)
MCHC: 33.3 g/dL (ref 31.5–35.7)
MCV: 95 fL (ref 79–97)
Monocytes Absolute: 0.7 10*3/uL (ref 0.1–0.9)
Monocytes: 6 %
Neutrophils Absolute: 7.4 10*3/uL — ABNORMAL HIGH (ref 1.4–7.0)
Neutrophils: 64 %
Platelets: 276 10*3/uL (ref 150–450)
RBC: 4.87 x10E6/uL (ref 3.77–5.28)
RDW: 13.4 % (ref 11.7–15.4)
WBC: 11.3 10*3/uL — ABNORMAL HIGH (ref 3.4–10.8)

## 2022-04-18 MED ORDER — DAPAGLIFLOZIN PROPANEDIOL 5 MG PO TABS: 5.0000 mg | ORAL_TABLET | Freq: Every day | ORAL | 1 refills | Status: DC

## 2022-04-24 DIAGNOSIS — Z1211 Encounter for screening for malignant neoplasm of colon: Secondary | ICD-10-CM | POA: Diagnosis not present

## 2022-05-03 LAB — COLOGUARD: COLOGUARD: NEGATIVE

## 2022-05-05 ENCOUNTER — Other Ambulatory Visit: Payer: Self-pay | Admitting: Family

## 2022-05-06 ENCOUNTER — Ambulatory Visit: Payer: Medicare Other | Admitting: Cardiovascular Disease

## 2022-05-09 ENCOUNTER — Telehealth: Payer: Self-pay | Admitting: Pharmacist

## 2022-05-09 ENCOUNTER — Ambulatory Visit (INDEPENDENT_AMBULATORY_CARE_PROVIDER_SITE_OTHER): Payer: Medicare Other | Admitting: Pharmacist

## 2022-05-09 DIAGNOSIS — E1165 Type 2 diabetes mellitus with hyperglycemia: Secondary | ICD-10-CM

## 2022-05-09 NOTE — Telephone Encounter (Signed)
Patient needs refills on ozempic 2mg  weekly PAP status? Good on insulins Thanks!

## 2022-05-12 NOTE — Telephone Encounter (Signed)
Submitted application for OZEMPIC, TRESIBA U200, & FIASP to [place] for patient assistance, via online portal.   Phone: [phone]

## 2022-05-13 ENCOUNTER — Telehealth: Payer: Self-pay | Admitting: Family

## 2022-05-13 NOTE — Telephone Encounter (Signed)
Patient calling to let Almyra Free know that she only has 2 HUMALOG KWIKPEN 200 UNIT/ML KwikPen left. Said she spoke to [place] about it and thought she had more. Please send more.

## 2022-05-14 ENCOUNTER — Other Ambulatory Visit: Payer: Self-pay | Admitting: Family

## 2022-05-14 NOTE — Telephone Encounter (Signed)
Leah Ferrell can you check on tresiba, novolog and ozemipc PAP shipment status?  Patient might be referring to her humalog that PCP sent in to local pharmacy.  If she is out I know we have novolog samples we can give.  Just let me know.  Patient is very sweet!

## 2022-05-15 NOTE — Telephone Encounter (Signed)
Received notification from [place] regarding re-enrollment approval for OZEMPIC 2MG  DOSE PENS, TRESIBA U200, & FIASP PENS. Patient assistance approved from 04/19/22 to 02/17/23.  Phone: [phone]  I wasn't aware of the Novolog - should I send a med change request?

## 2022-05-20 ENCOUNTER — Telehealth: Payer: Self-pay | Admitting: Family

## 2022-05-20 NOTE — Telephone Encounter (Signed)
Patient states she only has 45 units left. States she was waiting on julie to have these sent to her please advise, We do not have any samples at this time

## 2022-05-20 NOTE — Telephone Encounter (Signed)
Novolog sample given.   Evelina Dun, FNP

## 2022-05-21 NOTE — Telephone Encounter (Signed)
Patient has been approved for Ricard Dillon, & Ozempic We are still awaiting shipments and will call patient as soon as they arrive  Just FYI!

## 2022-05-21 NOTE — Progress Notes (Signed)
   Pharmacy Note   05/09/22 Name:  Leah Ferrell  MRN:  [id number]      DOB:  30-Mar-1950   Summary:   Diabetes: Goal NOT on Track. Uncontrolled, A1c up to 8.9% (continues to increase); open heart surgery May 2022;  New regimen:  1. TRESIBA increase 20-30 units NIGHTLY (LONG ACTING--patient refers to as the "green pen" since it has green label) 2. FIASP (SHORT ACTING/meal time INSULIN) to 20-30 UNITS WITH MEALS 3x DAILY 3. OZEMPIC 2mg  WEEKLY  Denies personal and family history of Medullary thyroid cancer (MTC) Current glucose readings: fasting glucose: <170, 130s per patient, post prandial glucose: n/a Consider libre pending patient preference DENIES hypoglycemia-adjusted insulin as above Current exercise: bike at home Educated on new basal insulin; new regimen; Recommend heart healthy diet/plate method given QA348G issues Assessed patient finances. WILL RE-Enroll in the [employer] patient assistance program for tresiba, fiasp, ozempic     Patient Goals/Self-Care Activities patient will:  - take medications as prescribed as evidenced by patient report and record review check glucose 4 times daily , document, and provide at future appointments collaborate with provider on medication access solutions target a minimum of 150 minutes of moderate intensity exercise weekly engage in dietary modifications by FOLLOWING A HEART HEALTHY DIET/HEALTHY PLATE METHOD   F/u pharmD in 2 weeks to clarify med list vs patient assistance meds  Regina Eck, PharmD, [place] Pharmacist, [place]

## 2022-05-22 ENCOUNTER — Telehealth: Payer: Self-pay

## 2022-05-22 NOTE — Telephone Encounter (Signed)
Patient informed that medication has been received and placed in refrigerator for pick up:  [place], [place], Ozempic, Tyler Aas

## 2022-05-27 IMAGING — CR DG CHEST 2V
2 series · 2 of 2 positions shown · non-contrast
Comparison: 12/01/17

CLINICAL DATA: Preop evaluation

EXAM:
CHEST - 2 VIEW

[w chest pa]
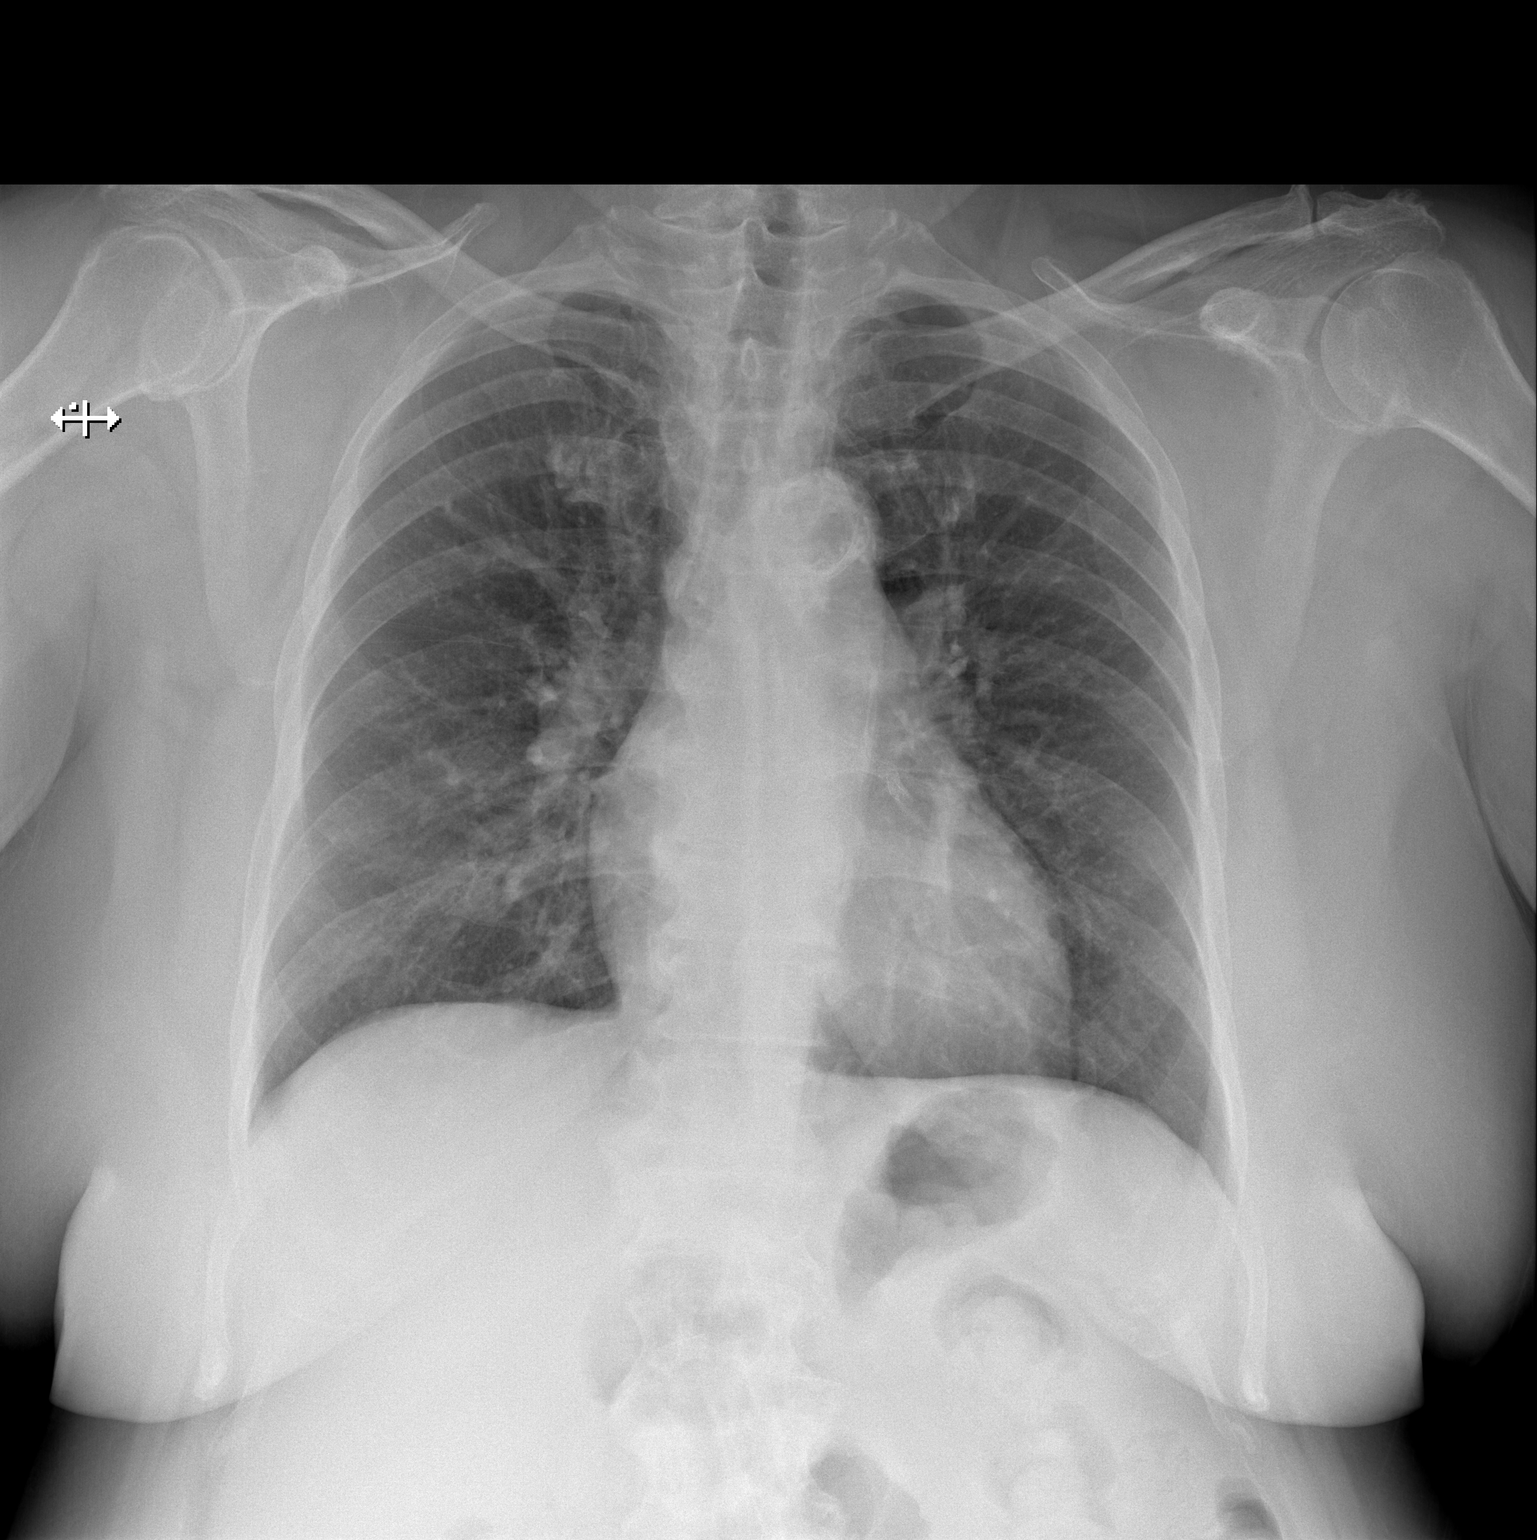

[w chest lat]
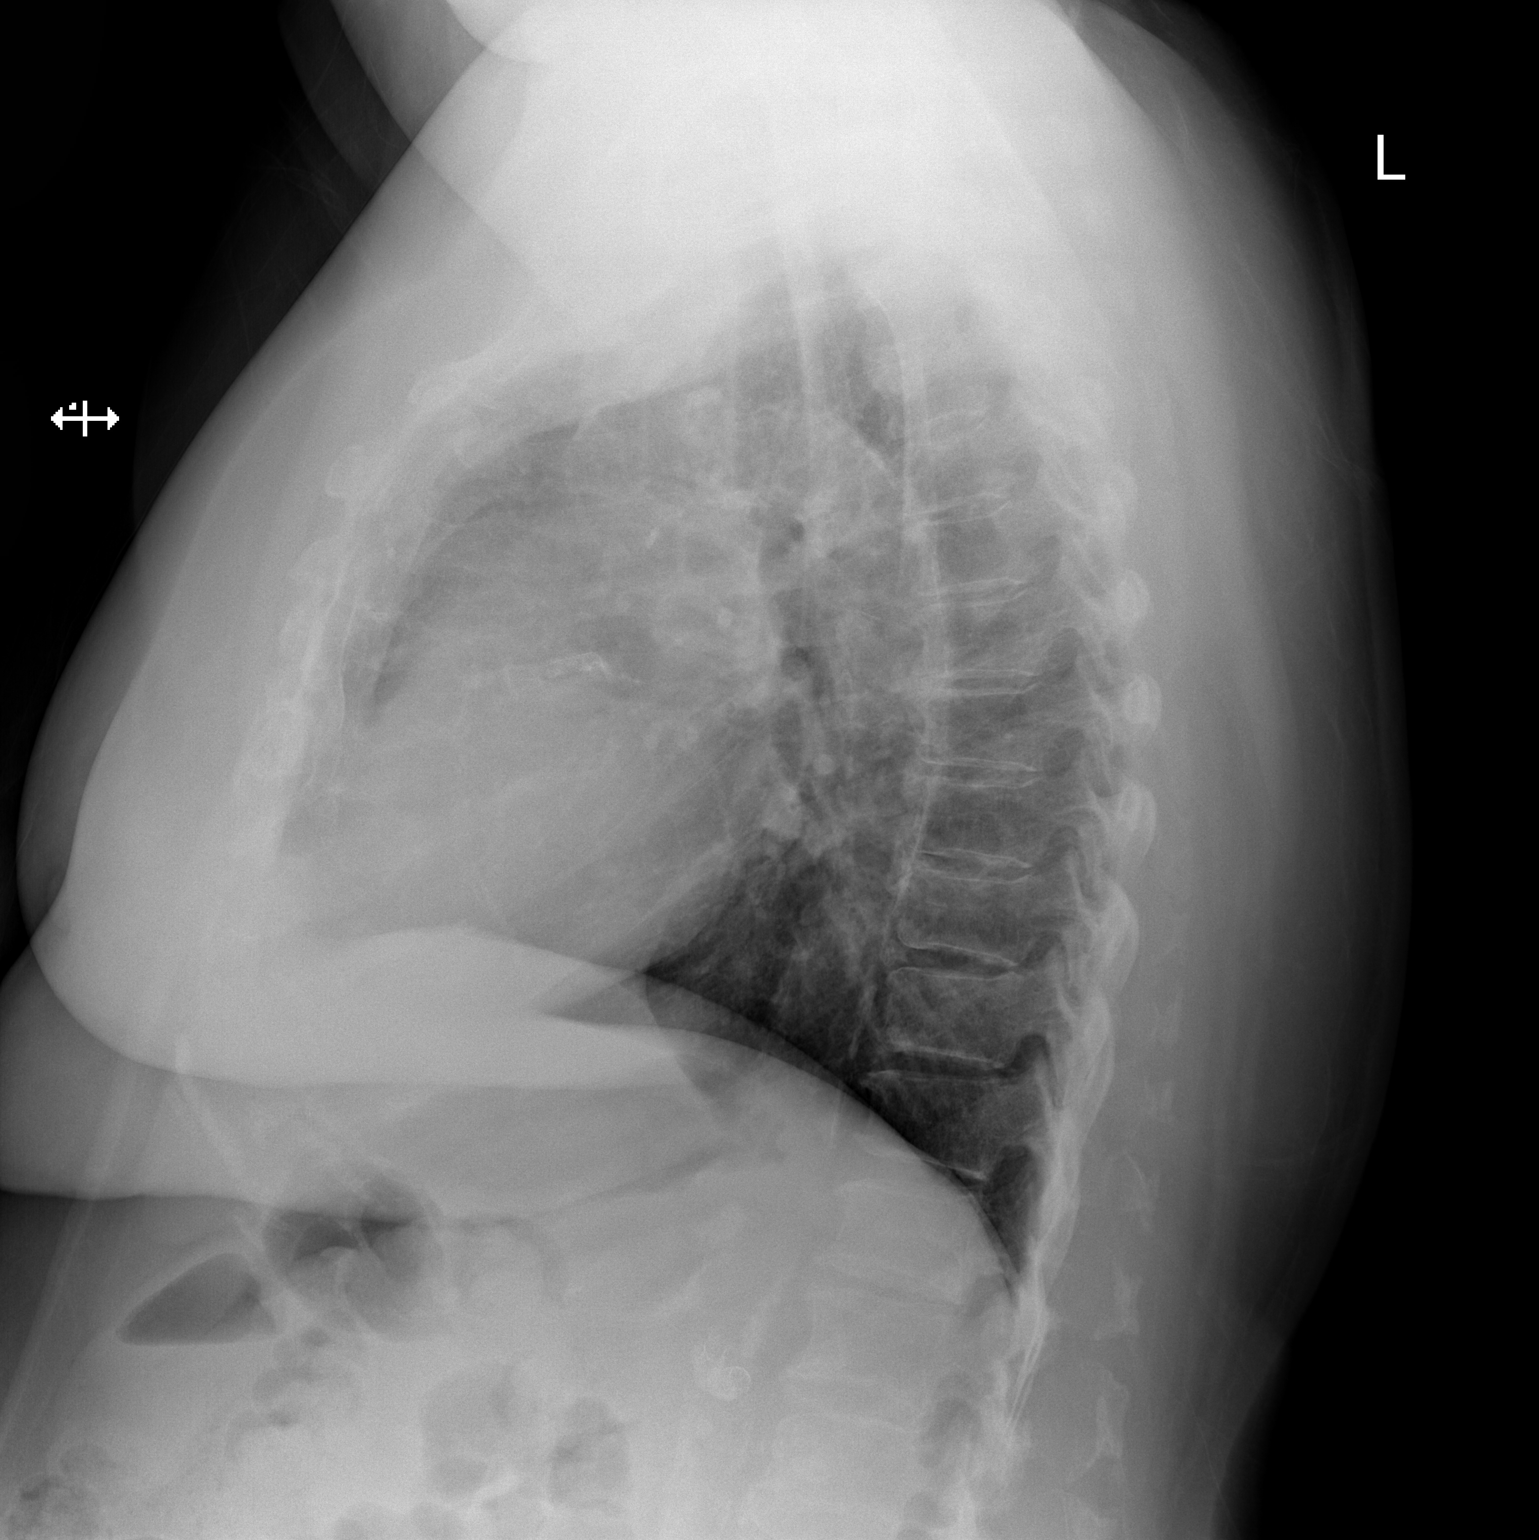

[2 of 2 positions shown; findings below may reference images not displayed]

FINDINGS: Cardiac shadow is within normal limits. Aortic calcifications are
noted. The lungs are clear bilaterally. No acute bony abnormality is
seen.
IMPRESSION: No active cardiopulmonary disease.

## 2022-05-28 ENCOUNTER — Other Ambulatory Visit: Payer: Self-pay | Admitting: Family

## 2022-05-28 DIAGNOSIS — E1159 Type 2 diabetes mellitus with other circulatory complications: Secondary | ICD-10-CM

## 2022-06-02 IMAGING — DX DG CHEST 1V PORT
1 series · 1 of 1 positions shown · non-contrast
Comparison: 06/27/2020

CLINICAL DATA: Shortness of breath

EXAM:
PORTABLE CHEST 1 VIEW

[chest ap]
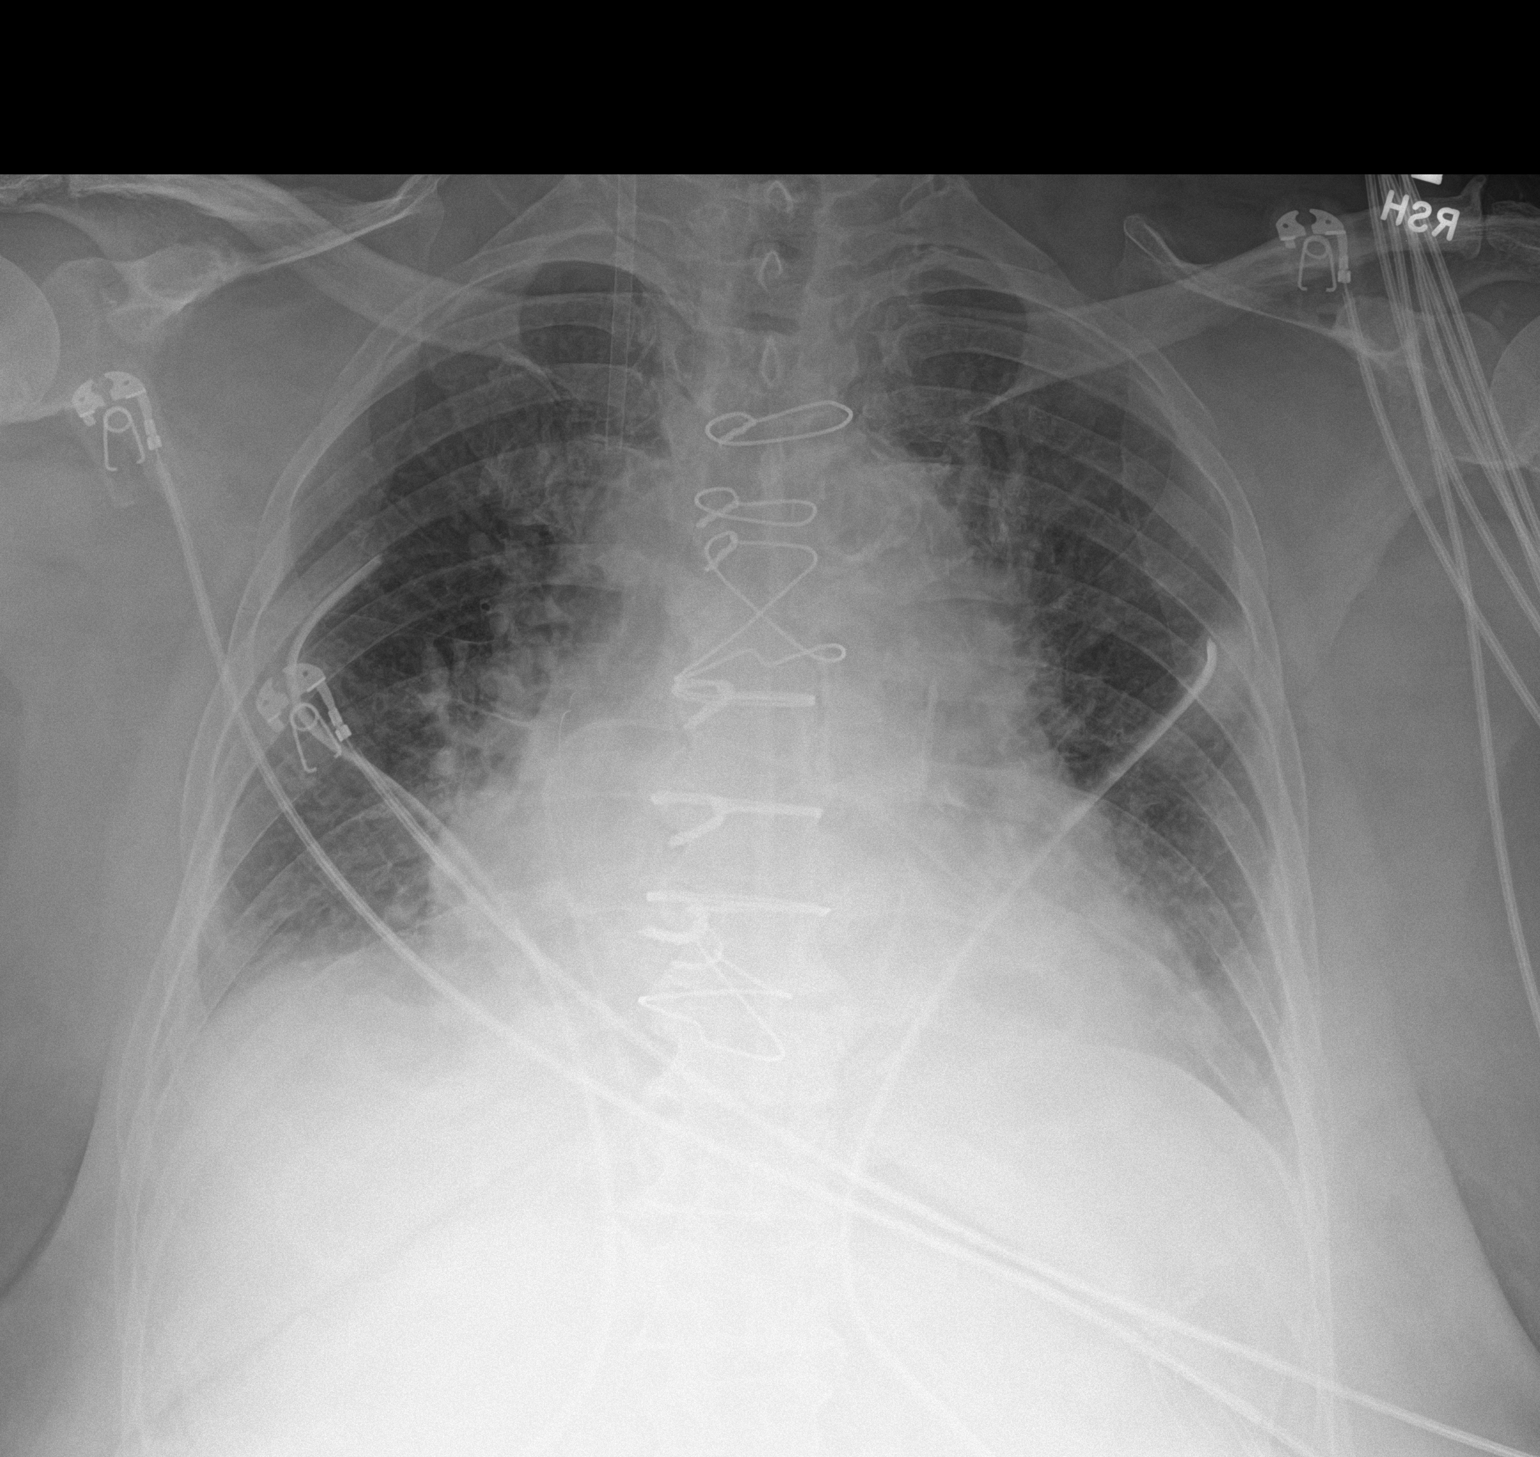

[1 of 1 positions shown; findings below may reference images not displayed]

FINDINGS: Prior CABG. Cardiomegaly. Vascular congestion. Left chest tube in
place without pneumothorax. Mild interstitial prominence may reflect
interstitial edema. Bibasilar atelectasis.
IMPRESSION: Cardiomegaly vascular congestion and possible mild interstitial
edema.

Bibasilar atelectasis.  No pneumothorax.

## 2022-06-03 ENCOUNTER — Ambulatory Visit: Payer: Medicare Other | Admitting: Pharmacist

## 2022-06-19 ENCOUNTER — Other Ambulatory Visit: Payer: Self-pay | Admitting: Family

## 2022-06-19 DIAGNOSIS — E1169 Type 2 diabetes mellitus with other specified complication: Secondary | ICD-10-CM

## 2022-07-02 ENCOUNTER — Telehealth: Payer: Self-pay | Admitting: Family

## 2022-07-02 NOTE — Telephone Encounter (Signed)
Contacted Leah Ferrell to schedule their annual wellness visit. Appointment made for 12/25/2022. Thank you,  Judeth Cornfield,  AMB Clinical Support [facility] AWV Program Direct Dial ??[phone]

## 2022-07-07 ENCOUNTER — Other Ambulatory Visit: Payer: Self-pay | Admitting: Family

## 2022-07-07 DIAGNOSIS — H26491 Other secondary cataract, right eye: Secondary | ICD-10-CM | POA: Diagnosis not present

## 2022-07-07 DIAGNOSIS — E113393 Type 2 diabetes mellitus with moderate nonproliferative diabetic retinopathy without macular edema, bilateral: Secondary | ICD-10-CM | POA: Diagnosis not present

## 2022-07-07 DIAGNOSIS — Z961 Presence of intraocular lens: Secondary | ICD-10-CM | POA: Diagnosis not present

## 2022-07-07 DIAGNOSIS — H25812 Combined forms of age-related cataract, left eye: Secondary | ICD-10-CM | POA: Diagnosis not present

## 2022-07-07 DIAGNOSIS — I152 Hypertension secondary to endocrine disorders: Secondary | ICD-10-CM

## 2022-07-07 LAB — HM DIABETES EYE EXAM

## 2022-07-18 ENCOUNTER — Encounter: Payer: Self-pay | Admitting: Family

## 2022-07-18 ENCOUNTER — Ambulatory Visit (INDEPENDENT_AMBULATORY_CARE_PROVIDER_SITE_OTHER): Payer: Medicare Other | Admitting: Family

## 2022-07-18 VITALS — BP 127/58 | HR 61 | Temp 97.5°F | Ht 62.5 in | Wt 200.0 lb

## 2022-07-18 DIAGNOSIS — E1151 Type 2 diabetes mellitus with diabetic peripheral angiopathy without gangrene: Secondary | ICD-10-CM | POA: Diagnosis not present

## 2022-07-18 DIAGNOSIS — Z0000 Encounter for general adult medical examination without abnormal findings: Secondary | ICD-10-CM

## 2022-07-18 DIAGNOSIS — I739 Peripheral vascular disease, unspecified: Secondary | ICD-10-CM

## 2022-07-18 DIAGNOSIS — Z0001 Encounter for general adult medical examination with abnormal findings: Secondary | ICD-10-CM | POA: Diagnosis not present

## 2022-07-18 DIAGNOSIS — Z951 Presence of aortocoronary bypass graft: Secondary | ICD-10-CM | POA: Diagnosis not present

## 2022-07-18 DIAGNOSIS — I2510 Atherosclerotic heart disease of native coronary artery without angina pectoris: Secondary | ICD-10-CM | POA: Diagnosis not present

## 2022-07-18 DIAGNOSIS — I252 Old myocardial infarction: Secondary | ICD-10-CM

## 2022-07-18 DIAGNOSIS — Z955 Presence of coronary angioplasty implant and graft: Secondary | ICD-10-CM | POA: Diagnosis not present

## 2022-07-18 DIAGNOSIS — E1159 Type 2 diabetes mellitus with other circulatory complications: Secondary | ICD-10-CM

## 2022-07-18 DIAGNOSIS — I5033 Acute on chronic diastolic (congestive) heart failure: Secondary | ICD-10-CM

## 2022-07-18 DIAGNOSIS — I110 Hypertensive heart disease with heart failure: Secondary | ICD-10-CM | POA: Diagnosis not present

## 2022-07-18 LAB — BAYER DCA HB A1C WAIVED: HB A1C (BAYER DCA - WAIVED): 7.8 % — ABNORMAL HIGH (ref 4.8–5.6)

## 2022-07-18 NOTE — Progress Notes (Signed)
Subjective:    Patient ID: Leah Ferrell, female    DOB: 14-Jun-1950, 72 y.o.   MRN: [id number]  Chief Complaint  Patient presents with   Medical Management of Chronic Issues   Pt presents to the office today for CPE and chronic follow up. Pt has CHF, CAD and hx MI and is followed by Cardiologists every 6 months. PT is followed by Vascular  for PVD and PAD. PT states these are stable.    Pt had a renal angiography on 03/16/19. She reports her swelling is improved and her BP is improved.     She had critical lower limb ischemia and had abdominal aortogram with lower extremity. 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    Pt is morbid obese because her BMI is 36 and has DM and HTN.   She is scheduled cataract surgery of 08/08/22. Hypertension This is a chronic problem. The current episode started more than 1 year ago. The problem has been waxing and waning since onset. The problem is uncontrolled. Associated symptoms include malaise/fatigue and peripheral edema (at the end of the day). Pertinent negatives include no blurred vision or shortness of breath. Risk factors for coronary artery disease include diabetes mellitus, dyslipidemia, obesity and sedentary lifestyle. The current treatment provides moderate improvement. Hypertensive end-organ damage includes heart failure.  Diabetes She presents for her follow-up diabetic visit. She has type 2 diabetes mellitus. Associated symptoms include fatigue and foot paresthesias. Pertinent negatives for diabetes include no blurred vision. There are no hypoglycemic complications. Symptoms are stable. Diabetic complications include peripheral neuropathy. She is following a generally unhealthy diet. Her overall blood glucose range is 140-180 mg/dl. Eye exam is current.  Congestive Heart Failure Presents for follow-up visit. Associated symptoms include claudication, edema and fatigue. Pertinent negatives include no shortness of breath.      Review of Systems   Constitutional:  Positive for fatigue and malaise/fatigue.  Eyes:  Negative for blurred vision.  Respiratory:  Negative for shortness of breath.   Cardiovascular:  Positive for claudication.  All other systems reviewed and are negative.   Family History  Problem Relation Age of Onset   Diabetes Mother    Hyperlipidemia Mother    Hypertension Mother    Peripheral vascular disease Mother    Other Sister        drowned   Other Sister        drowned   Diabetes Daughter        borderline   Hypertension Daughter    Diabetes Maternal Grandmother    Heart attack Maternal Grandfather    Heart disease Brother    Diabetes Brother    Alcohol abuse Brother    Diabetes Son    Hypertension Son    Hyperlipidemia Son    Breast cancer Neg Hx    Social History   Socioeconomic History   Marital status: Widowed    Spouse name: Not on file   Number of children: 4   Years of education: completed school in [place]    Highest education level: Not on file  Occupational History   Occupation: retired  Tobacco Use   Smoking status: Former    Packs/day: 1.00    Years: 36.00    Additional pack years: 0.00    Total pack years: 36.00    Types: Cigarettes    Start date: 04/21/1964    Quit date: 02/17/2001    Years since quitting: 21.4   Smokeless tobacco: Never  Vaping Use  Vaping Use: Never used  Substance and Sexual Activity   Alcohol use: No    Alcohol/week: 0.0 standard drinks of alcohol   Drug use: No   Sexual activity: Not Currently    Birth control/protection: Post-menopausal  Other Topics Concern   Not on file  Social History Narrative   Lives alone - one level - all of her children live far away   Social Determinants of Health   Financial Resource Strain: Low Risk  (12/23/2021)   Overall Financial Resource Strain (CARDIA)    Difficulty of Paying Living Expenses: Not hard at all  Food Insecurity: No Food Insecurity (12/23/2021)   Hunger Vital Sign    Worried About Running  Out of Food in the Last Year: Never true    Ran Out of Food in the Last Year: Never true  Transportation Needs: No Transportation Needs (12/23/2021)   PRAPARE - Administrator, Civil Service (Medical): No    Lack of Transportation (Non-Medical): No  Physical Activity: Insufficiently Active (12/23/2021)   Exercise Vital Sign    Days of Exercise per Week: 3 days    Minutes of Exercise per Session: 30 min  Stress: No Stress Concern Present (12/23/2021)   [employer] of Occupational Health - Occupational Stress Questionnaire    Feeling of Stress : Not at all  Social Connections: Moderately Isolated (12/23/2021)   Social Connection and Isolation Panel [NHANES]    Frequency of Communication with Friends and Family: More than three times a week    Frequency of Social Gatherings with Friends and Family: More than three times a week    Attends Religious Services: More than 4 times per year    Active Member of [employer] or Organizations: No    Attends Banker Meetings: Never    Marital Status: Widowed       Objective:   Physical Exam Vitals reviewed.  Constitutional:      General: She is not in acute distress.    Appearance: She is well-developed. She is obese.  HENT:     Head: Normocephalic and atraumatic.     Right Ear: Tympanic membrane normal.     Left Ear: Tympanic membrane normal.  Eyes:     Pupils: Pupils are equal, round, and reactive to light.  Neck:     Thyroid: No thyromegaly.  Cardiovascular:     Rate and Rhythm: Normal rate and regular rhythm.     Heart sounds: Normal heart sounds. No murmur heard. Pulmonary:     Effort: Pulmonary effort is normal. No respiratory distress.     Breath sounds: Normal breath sounds. No wheezing.  Abdominal:     General: Bowel sounds are normal. There is no distension.     Palpations: Abdomen is soft.     Tenderness: There is no abdominal tenderness.  Musculoskeletal:        General: No tenderness. Normal range of  motion.     Cervical back: Normal range of motion and neck supple.     Right lower leg: Edema (trace) present.     Left lower leg: Edema (trace) present.  Skin:    General: Skin is warm and dry.  Neurological:     Mental Status: She is alert and oriented to person, place, and time.     Cranial Nerves: No cranial nerve deficit.     Deep Tendon Reflexes: Reflexes are normal and symmetric.  Psychiatric:        Behavior: Behavior normal.  Thought Content: Thought content normal.        Judgment: Judgment normal.       BP (!) 127/58 Comment: AT HOME  Pulse 61   Temp (!) 97.5 F (36.4 C) (Temporal)   Ht 5' 2.5" (1.588 m)   Wt 200 lb (90.7 kg)   SpO2 98%   BMI 36.00 kg/m      Assessment & Plan:  Leah Ferrell comes in today with chief complaint of Medical Management of Chronic Issues   Diagnosis and orders addressed:  1. Annual physical exam - CBC with Differential/Platelet - CMP14+EGFR - Lipid panel - Bayer DCA Hb A1c Waived - Microalbumin / creatinine urine ratio - TSH  2. Acute on chronic diastolic CHF (congestive heart failure) (HCC) - CMP14+EGFR  3. Coronary artery disease involving native coronary artery of native heart without angina pectoris  - CMP14+EGFR - Lipid panel  4. Diabetes mellitus with peripheral vascular disease (HCC) - CMP14+EGFR - Bayer DCA Hb A1c Waived - Microalbumin / creatinine urine ratio  5. Hx of non-ST elevation myocardial infarction (NSTEMI) - CMP14+EGFR - Lipid panel  6. Hypertension associated with diabetes (HCC)  - CMP14+EGFR  7. Morbid obesity (HCC)  - CMP14+EGFR  8. PAD (peripheral artery disease) (HCC) - CMP14+EGFR  9. PVD (peripheral vascular disease) (HCC) - CMP14+EGFR - Lipid panel  10. S/P CABG x 3 - CMP14+EGFR  11. S/P primary angioplasty with coronary stent - CMP14+EGFR   Labs pending Health Maintenance reviewed Diet and exercise encouraged  Follow up plan: 3 months    Jannifer Rodney, FNP

## 2022-07-18 NOTE — Patient Instructions (Signed)
Health Maintenance After Age 71 After age 71, you are at a higher risk for certain long-term diseases and infections as well as injuries from falls. Falls are a major cause of broken bones and head injuries in people who are older than age 71. Getting regular preventive care can help to keep you healthy and well. Preventive care includes getting regular testing and making lifestyle changes as recommended by your health care provider. Talk with your health care provider about: Which screenings and tests you should have. A screening is a test that checks for a disease when you have no symptoms. A diet and exercise plan that is right for you. What should I know about screenings and tests to prevent falls? Screening and testing are the best ways to find a health problem early. Early diagnosis and treatment give you the best chance of managing medical conditions that are common after age 71. Certain conditions and lifestyle choices may make you more likely to have a fall. Your health care provider may recommend: Regular vision checks. Poor vision and conditions such as cataracts can make you more likely to have a fall. If you wear glasses, make sure to get your prescription updated if your vision changes. Medicine review. Work with your health care provider to regularly review all of the medicines you are taking, including over-the-counter medicines. Ask your health care provider about any side effects that may make you more likely to have a fall. Tell your health care provider if any medicines that you take make you feel dizzy or sleepy. Strength and balance checks. Your health care provider may recommend certain tests to check your strength and balance while standing, walking, or changing positions. Foot health exam. Foot pain and numbness, as well as not wearing proper footwear, can make you more likely to have a fall. Screenings, including: Osteoporosis screening. Osteoporosis is a condition that causes  the bones to get weaker and break more easily. Blood pressure screening. Blood pressure changes and medicines to control blood pressure can make you feel dizzy. Depression screening. You may be more likely to have a fall if you have a fear of falling, feel depressed, or feel unable to do activities that you used to do. Alcohol use screening. Using too much alcohol can affect your balance and may make you more likely to have a fall. Follow these instructions at home: Lifestyle Do not drink alcohol if: Your health care provider tells you not to drink. If you drink alcohol: Limit how much you have to: 0-1 drink a day for women. 0-2 drinks a day for men. Know how much alcohol is in your drink. In the [place] one drink equals one 12 oz bottle of beer (355 mL), one 5 oz glass of wine (148 mL), or one 1 oz glass of hard liquor (44 mL). Do not use any products that contain nicotine or tobacco. These products include cigarettes, chewing tobacco, and vaping devices, such as e-cigarettes. If you need help quitting, ask your health care provider. Activity  Follow a regular exercise program to stay fit. This will help you maintain your balance. Ask your health care provider what types of exercise are appropriate for you. If you need a cane or walker, use it as recommended by your health care provider. Wear supportive shoes that have nonskid soles. Safety  Remove any tripping hazards, such as rugs, cords, and clutter. Install safety equipment such as grab bars in bathrooms and safety rails on stairs. Keep rooms and walkways   well-lit. General instructions Talk with your health care provider about your risks for falling. Tell your health care provider if: You fall. Be sure to tell your health care provider about all falls, even ones that seem minor. You feel dizzy, tiredness (fatigue), or off-balance. Take over-the-counter and prescription medicines only as told by your health care provider. These include  supplements. Eat a healthy diet and maintain a healthy weight. A healthy diet includes low-fat dairy products, low-fat (lean) meats, and fiber from whole grains, beans, and lots of fruits and vegetables. Stay current with your vaccines. Schedule regular health, dental, and eye exams. Summary Having a healthy lifestyle and getting preventive care can help to protect your health and wellness after age 71. Screening and testing are the best way to find a health problem early and help you avoid having a fall. Early diagnosis and treatment give you the best chance for managing medical conditions that are more common for people who are older than age 71. Falls are a major cause of broken bones and head injuries in people who are older than age 71. Take precautions to prevent a fall at home. Work with your health care provider to learn what changes you can make to improve your health and wellness and to prevent falls. This information is not intended to replace advice given to you by your health care provider. Make sure you discuss any questions you have with your health care provider. Document Revised: 06/25/2020 Document Reviewed: 06/25/2020 Elsevier Patient Education  2024 Elsevier Inc.  

## 2022-07-19 LAB — CMP14+EGFR
ALT: 36 IU/L — ABNORMAL HIGH (ref 0–32)
AST: 24 IU/L (ref 0–40)
Albumin/Globulin Ratio: 1.2 (ref 1.2–2.2)
Albumin: 4.3 g/dL (ref 3.8–4.8)
Alkaline Phosphatase: 65 IU/L (ref 44–121)
BUN/Creatinine Ratio: 14 (ref 12–28)
BUN: 11 mg/dL (ref 8–27)
Bilirubin Total: 0.3 mg/dL (ref 0.0–1.2)
CO2: 29 mmol/L (ref 20–29)
Calcium: 10.3 mg/dL (ref 8.7–10.3)
Chloride: 96 mmol/L (ref 96–106)
Creatinine, Ser: 0.81 mg/dL (ref 0.57–1.00)
Globulin, Total: 3.6 g/dL (ref 1.5–4.5)
Glucose: 124 mg/dL — ABNORMAL HIGH (ref 70–99)
Potassium: 4.9 mmol/L (ref 3.5–5.2)
Sodium: 137 mmol/L (ref 134–144)
Total Protein: 7.9 g/dL (ref 6.0–8.5)
eGFR: 78 mL/min/{1.73_m2} (ref >59)

## 2022-07-19 LAB — CBC WITH DIFFERENTIAL/PLATELET
Basophils Absolute: 0.1 10*3/uL (ref 0.0–0.2)
Basos: 0 %
EOS (ABSOLUTE): 0.2 10*3/uL (ref 0.0–0.4)
Eos: 2 %
Hematocrit: 46.1 % (ref 34.0–46.6)
Hemoglobin: 15.1 g/dL (ref 11.1–15.9)
Immature Grans (Abs): 0 10*3/uL (ref 0.0–0.1)
Immature Granulocytes: 0 %
Lymphocytes Absolute: 3.1 10*3/uL (ref 0.7–3.1)
Lymphs: 27 %
MCH: 30.8 pg (ref 26.6–33.0)
MCHC: 32.8 g/dL (ref 31.5–35.7)
MCV: 94 fL (ref 79–97)
Monocytes Absolute: 0.7 10*3/uL (ref 0.1–0.9)
Monocytes: 6 %
Neutrophils Absolute: 7.5 10*3/uL — ABNORMAL HIGH (ref 1.4–7.0)
Neutrophils: 65 %
Platelets: 300 10*3/uL (ref 150–450)
RBC: 4.9 x10E6/uL (ref 3.77–5.28)
RDW: 13.3 % (ref 11.7–15.4)
WBC: 11.6 10*3/uL — ABNORMAL HIGH (ref 3.4–10.8)

## 2022-07-19 LAB — MICROALBUMIN / CREATININE URINE RATIO
Creatinine, Urine: 202.4 mg/dL
Microalb/Creat Ratio: 222 mg/g creat — ABNORMAL HIGH (ref 0–29)
Microalbumin, Urine: 448.5 ug/mL

## 2022-07-19 LAB — LIPID PANEL
Chol/HDL Ratio: 2.2 ratio (ref 0.0–4.4)
Cholesterol, Total: 169 mg/dL (ref 100–199)
HDL: 78 mg/dL (ref >39)
LDL Chol Calc (NIH): 68 mg/dL (ref 0–99)
Triglycerides: 139 mg/dL (ref 0–149)
VLDL Cholesterol Cal: 23 mg/dL (ref 5–40)

## 2022-07-19 LAB — TSH: TSH: 2.17 u[IU]/mL (ref 0.450–4.500)

## 2022-08-01 NOTE — H&P (Signed)
Surgical History & Physical  Patient Name: Leah Ferrell  DOB: 08-08-1950  Surgery: Cataract extraction with intraocular lens implant phacoemulsification; Left Eye Surgeon: Fabio Pierce MD Surgery Date: 08/08/2022 Pre-Op Date: 07/07/2022  HPI: A 64 Yr. old female patient 1. The patient complains of difficulty when seeing street signs, which began many years ago. The left eye is affected. The episode is constant. The condition's severity is worsening. Patient has trouble seeing things up close for several years. This is negatively affecting the patient's quality of life and the patient is unable to function adequately in life with the current level of vision. HPI was performed by Fabio Pierce .  Medical History:  Diabetes Heart Problem High Blood Pressure LDL Lung Problems  Review of Systems Ear, Nose, Mouth & Throat speech impairment All recorded systems are negative except as noted above.  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  COMBINED FORMS AGE RELATED CATARACT; Left Eye (H25.812) 2.  DM Type 2; Both Mod Without ME (Z61.0960) 3.  PCO; Right Eye (H26.491) 4.  INTRAOCULAR LENS IOL ; Right Eye (Z96.1) 5.  Pinguecula; Both Eyes (H11.153) 6.  BLEPHARITIS; Right Upper Lid, Right Lower Lid, Left Upper Lid, Left Lower Lid (H01.001, H01.002,H01.004,H01.005)  Plan: 1.  Cataract accounts for the patient's decreased vision. This visual  impairment is not correctable with a tolerable change in glasses or contact lenses. Cataract surgery with an implantation of a new lens should significantly improve the visual and functional status of the patient. Discussed all risks, benefits, alternatives, and potential complications. Discussed the procedures and recovery. Patient desires to have surgery. A-scan ordered and performed today for intra-ocular lens calculations. The surgery will be performed in order to improve vision for driving, reading, and for eye examinations. Recommend phacoemulsification with intra-ocular lens. Recommend Dextenza for post-operative pain and inflammation. Left Eye. Surgery required to correct imbalance of vision. Dilates well - shugarcaine by protocol. SY60WF IOL.  2.  Stable hemorrhages and MAs OU. Recommend follow up with PCP for blood glucose/blood pressure/cholesterol control. Diet and exercise approved by PCP is also advised. Recommend annual dilated eye exam. Answered questions. Gave printed information to patient. Call with any worsening vision, pain, or any other concerns.  3.  Asymptomatic. Findings, prognosis and treatment options reviewed. No indication for laser at this point, will observe for changes.  4.  PCO OD. Most likely Alcon IOL - patient had surgery at [facility]. Records may be in [facility].  5.  Observe; Artificial tears as needed for irritation.  6.  Recommend regular lid cleaning

## 2022-08-02 ENCOUNTER — Other Ambulatory Visit: Payer: Self-pay | Admitting: Family

## 2022-08-06 ENCOUNTER — Encounter (HOSPITAL_COMMUNITY)
Admission: RE | Admit: 2022-08-06 | Discharge: 2022-08-06 | Disposition: A | Discharge status: Home / Self Care | Payer: Medicare Other | Source: Ambulatory Visit | Attending: Ophthalmology | Admitting: Ophthalmology

## 2022-08-13 ENCOUNTER — Other Ambulatory Visit: Payer: Self-pay | Admitting: Family

## 2022-08-13 DIAGNOSIS — E1159 Type 2 diabetes mellitus with other circulatory complications: Secondary | ICD-10-CM

## 2022-08-14 ENCOUNTER — Telehealth: Payer: Self-pay | Admitting: Family

## 2022-08-26 ENCOUNTER — Other Ambulatory Visit: Payer: Self-pay | Admitting: Pharmacist

## 2022-08-26 ENCOUNTER — Telehealth: Payer: Self-pay | Admitting: Family

## 2022-08-26 NOTE — Progress Notes (Signed)
   08/26/2022 Name: Leah Ferrell MRN: [id number] DOB: [birth year]  Type 2 Diabetes  Fasting blood sugar up to 200 Patient only injecting 16 units nightly Increase Tresiba to 20 units Using 20-40 units 3 times daily with meals (short acting samples of Humalog left up front to bridge Fiasp supply) Continue all other medications as prescribed Would prefer to use Libre 3 CGM Discussed with patient; will follow up Medications to be shipped to PCP office within this week (will let patient know when it arrives)   Lab Results  Component Value Date   HGBA1C 7.8 (H) 07/18/2022    Lab Results  Component Value Date   CREATININE 0.81 07/18/2022   BUN 11 07/18/2022   NA 137 07/18/2022   K 4.9 07/18/2022   CL 96 07/18/2022   CO2 29 07/18/2022    Lab Results  Component Value Date   CHOL 169 07/18/2022   HDL 78 07/18/2022   LDLCALC 68 07/18/2022   TRIG 139 07/18/2022   CHOLHDL 2.2 07/18/2022    Kieth Brightly, PharmD, BCACP Clinical Pharmacist, [facility]

## 2022-08-26 NOTE — Telephone Encounter (Signed)
No answer; Left message for patient  Novo nordisk shipment should arrive any day now;  She is on [place] and Ozempic from novo patient assistance program It appears she have been getting humalog filled at [facility] on the side Urged patient to return my call ASAP to discuss Removed humalog from medlist and replaced with fiasp

## 2022-09-01 DIAGNOSIS — H25812 Combined forms of age-related cataract, left eye: Secondary | ICD-10-CM | POA: Diagnosis not present

## 2022-09-03 ENCOUNTER — Encounter (HOSPITAL_COMMUNITY): Payer: Self-pay

## 2022-09-03 ENCOUNTER — Other Ambulatory Visit: Payer: Self-pay

## 2022-09-03 ENCOUNTER — Encounter (HOSPITAL_COMMUNITY)
Admission: RE | Admit: 2022-09-03 | Discharge: 2022-09-03 | Disposition: A | Discharge status: Home / Self Care | Payer: Medicare Other | Source: Ambulatory Visit | Attending: Ophthalmology | Admitting: Ophthalmology

## 2022-09-05 ENCOUNTER — Telehealth: Payer: Self-pay | Admitting: Family

## 2022-09-05 NOTE — Telephone Encounter (Signed)
Humalog samples retrieved by Angeline Slim Patient verbalizes understanding (as she is normally on fiasp for meal time coverage) We are still awaiting her novo nordisk shipments Will route to Nelson, CPhT to determine when shipment will arrive (tresiba, fiasp, ozempic)

## 2022-09-05 NOTE — Telephone Encounter (Signed)
Per Raynelle Fanning ok to give a few samples of the Humalog pens from lab fridge.  Pt made aware and is picking up today.

## 2022-09-08 ENCOUNTER — Ambulatory Visit (HOSPITAL_BASED_OUTPATIENT_CLINIC_OR_DEPARTMENT_OTHER): Payer: Medicare Other | Admitting: Certified Registered"

## 2022-09-08 ENCOUNTER — Ambulatory Visit (HOSPITAL_COMMUNITY)
Admission: RE | Admit: 2022-09-08 | Discharge: 2022-09-08 | Disposition: A | Discharge status: Home / Self Care | Payer: Medicare Other | Source: Ambulatory Visit | Attending: Ophthalmology | Admitting: Ophthalmology

## 2022-09-08 ENCOUNTER — Ambulatory Visit (HOSPITAL_COMMUNITY): Payer: Medicare Other | Admitting: Certified Registered"

## 2022-09-08 ENCOUNTER — Encounter (HOSPITAL_COMMUNITY)
Admission: RE | Disposition: A | Discharge status: Home / Self Care | Payer: Self-pay | Source: Ambulatory Visit | Attending: Ophthalmology

## 2022-09-08 DIAGNOSIS — I5033 Acute on chronic diastolic (congestive) heart failure: Secondary | ICD-10-CM

## 2022-09-08 DIAGNOSIS — I25119 Atherosclerotic heart disease of native coronary artery with unspecified angina pectoris: Secondary | ICD-10-CM

## 2022-09-08 DIAGNOSIS — H5712 Ocular pain, left eye: Secondary | ICD-10-CM | POA: Diagnosis not present

## 2022-09-08 DIAGNOSIS — Z951 Presence of aortocoronary bypass graft: Secondary | ICD-10-CM | POA: Insufficient documentation

## 2022-09-08 DIAGNOSIS — I10 Essential (primary) hypertension: Secondary | ICD-10-CM | POA: Diagnosis not present

## 2022-09-08 DIAGNOSIS — I110 Hypertensive heart disease with heart failure: Secondary | ICD-10-CM | POA: Diagnosis not present

## 2022-09-08 DIAGNOSIS — I509 Heart failure, unspecified: Secondary | ICD-10-CM | POA: Diagnosis not present

## 2022-09-08 DIAGNOSIS — H01002 Unspecified blepharitis right lower eyelid: Secondary | ICD-10-CM | POA: Diagnosis not present

## 2022-09-08 DIAGNOSIS — E1136 Type 2 diabetes mellitus with diabetic cataract: Secondary | ICD-10-CM | POA: Diagnosis not present

## 2022-09-08 DIAGNOSIS — Z09 Encounter for follow-up examination after completed treatment for conditions other than malignant neoplasm: Secondary | ICD-10-CM | POA: Diagnosis not present

## 2022-09-08 DIAGNOSIS — Z7984 Long term (current) use of oral hypoglycemic drugs: Secondary | ICD-10-CM | POA: Diagnosis not present

## 2022-09-08 DIAGNOSIS — H01005 Unspecified blepharitis left lower eyelid: Secondary | ICD-10-CM | POA: Diagnosis not present

## 2022-09-08 DIAGNOSIS — H25812 Combined forms of age-related cataract, left eye: Secondary | ICD-10-CM | POA: Diagnosis not present

## 2022-09-08 DIAGNOSIS — H01004 Unspecified blepharitis left upper eyelid: Secondary | ICD-10-CM | POA: Diagnosis not present

## 2022-09-08 DIAGNOSIS — H11153 Pinguecula, bilateral: Secondary | ICD-10-CM | POA: Diagnosis not present

## 2022-09-08 DIAGNOSIS — H01001 Unspecified blepharitis right upper eyelid: Secondary | ICD-10-CM | POA: Diagnosis not present

## 2022-09-08 DIAGNOSIS — Z87891 Personal history of nicotine dependence: Secondary | ICD-10-CM | POA: Diagnosis not present

## 2022-09-08 HISTORY — PX: CATARACT EXTRACTION W/PHACO: SHX586

## 2022-09-08 LAB — GLUCOSE, CAPILLARY: Glucose-Capillary: 184 mg/dL — ABNORMAL HIGH (ref 70–99)

## 2022-09-08 SURGERY — PHACOEMULSIFICATION, CATARACT, WITH IOL INSERTION
Anesthesia: Monitor Anesthesia Care | Site: Eye | Laterality: Left

## 2022-09-08 MED ORDER — SODIUM HYALURONATE 10 MG/ML IO SOLUTION
PREFILLED_SYRINGE | INTRAOCULAR | Status: DC | PRN
  Administered 2022-09-08: .85 mL via INTRAOCULAR

## 2022-09-08 MED ORDER — EPINEPHRINE PF 1 MG/ML IJ SOLN
INTRAOCULAR | Status: DC | PRN
  Administered 2022-09-08: 500 mL

## 2022-09-08 MED ORDER — TETRACAINE HCL 0.5 % OP SOLN
1.0000 [drp] | OPHTHALMIC | Status: AC | PRN
  Administered 2022-09-08 (×3): 1 [drp] via OPHTHALMIC

## 2022-09-08 MED ORDER — DEXAMETHASONE 0.4 MG OP INST
VAGINAL_INSERT | OPHTHALMIC | Status: AC
  Filled 2022-09-08: qty 1

## 2022-09-08 MED ORDER — NEOMYCIN-POLYMYXIN-DEXAMETH 3.5-10000-0.1 OP SUSP
OPHTHALMIC | Status: DC | PRN
  Administered 2022-09-08: 1 [drp] via OPHTHALMIC

## 2022-09-08 MED ORDER — PHENYLEPHRINE HCL 2.5 % OP SOLN
1.0000 [drp] | OPHTHALMIC | Status: AC | PRN
  Administered 2022-09-08 (×3): 1 [drp] via OPHTHALMIC

## 2022-09-08 MED ORDER — POVIDONE-IODINE 5 % OP SOLN
OPHTHALMIC | Status: DC | PRN
  Administered 2022-09-08: 1 via OPHTHALMIC

## 2022-09-08 MED ORDER — SODIUM HYALURONATE 23MG/ML IO SOSY
PREFILLED_SYRINGE | INTRAOCULAR | Status: DC | PRN
  Administered 2022-09-08: .6 mL via INTRAOCULAR

## 2022-09-08 MED ORDER — STERILE WATER FOR IRRIGATION IR SOLN
Status: DC | PRN
  Administered 2022-09-08: 250 mL

## 2022-09-08 MED ORDER — MIDAZOLAM HCL 2 MG/2ML IJ SOLN
INTRAMUSCULAR | Status: AC
  Filled 2022-09-08: qty 2

## 2022-09-08 MED ORDER — TROPICAMIDE 1 % OP SOLN
1.0000 [drp] | OPHTHALMIC | Status: AC | PRN
  Administered 2022-09-08 (×3): 1 [drp] via OPHTHALMIC

## 2022-09-08 MED ORDER — DEXAMETHASONE 0.4 MG OP INST
VAGINAL_INSERT | OPHTHALMIC | Status: DC | PRN
  Administered 2022-09-08: .4 mg via OPHTHALMIC

## 2022-09-08 MED ORDER — LIDOCAINE HCL 3.5 % OP GEL
1.0000 | Freq: Once | OPHTHALMIC | Status: AC
  Administered 2022-09-08: 1 via OPHTHALMIC

## 2022-09-08 MED ORDER — SODIUM CHLORIDE 0.9% FLUSH
INTRAVENOUS | Status: DC | PRN
  Administered 2022-09-08: 5 mL via INTRAVENOUS

## 2022-09-08 MED ORDER — LIDOCAINE HCL (PF) 1 % IJ SOLN
INTRAOCULAR | Status: DC | PRN
  Administered 2022-09-08: 1 mL via OPHTHALMIC

## 2022-09-08 MED ORDER — MIDAZOLAM HCL 2 MG/2ML IJ SOLN
INTRAMUSCULAR | Status: DC | PRN
  Administered 2022-09-08: 1 mg via INTRAVENOUS

## 2022-09-08 MED ORDER — BSS IO SOLN
INTRAOCULAR | Status: DC | PRN
  Administered 2022-09-08: 15 mL via INTRAOCULAR

## 2022-09-08 SURGICAL SUPPLY — 14 items
CATARACT SUITE SIGHTPATH (MISCELLANEOUS) ×1 IMPLANT
CLOTH BEACON ORANGE TIMEOUT ST (SAFETY) ×1 IMPLANT
EYE SHIELD UNIVERSAL CLEAR (GAUZE/BANDAGES/DRESSINGS) IMPLANT
FEE CATARACT SUITE SIGHTPATH (MISCELLANEOUS) ×1 IMPLANT
GLOVE BIOGEL PI IND STRL 7.0 (GLOVE) ×2 IMPLANT
LENS IOL CLRN 22.0 (Intraocular Lens) IMPLANT
LENS IOL MONO CLAREON 22.0 (Intraocular Lens) ×1 IMPLANT
NDL HYPO 18GX1.5 BLUNT FILL (NEEDLE) ×1 IMPLANT
NEEDLE HYPO 18GX1.5 BLUNT FILL (NEEDLE) ×1 IMPLANT
PAD ARMBOARD 7.5X6 YLW CONV (MISCELLANEOUS) ×1 IMPLANT
POSITIONER HEAD 8X9X4 ADT (SOFTGOODS) ×1 IMPLANT
SYR TB 1ML LL NO SAFETY (SYRINGE) ×1 IMPLANT
TAPE PAPER 2X10 WHT MICROPORE (GAUZE/BANDAGES/DRESSINGS) IMPLANT
WATER STERILE IRR 250ML POUR (IV SOLUTION) ×1 IMPLANT

## 2022-09-08 NOTE — Anesthesia Postprocedure Evaluation (Signed)
Anesthesia Post Note  Patient: BARBARAANN AVANS  Procedure(s) Performed: CATARACT EXTRACTION PHACO AND INTRAOCULAR LENS PLACEMENT (IOC) with placement of Corticosteroid (Left: Eye)  Patient location during evaluation: Phase II Anesthesia Type: MAC Level of consciousness: awake Pain management: pain level controlled Vital Signs Assessment: post-procedure vital signs reviewed and stable Respiratory status: spontaneous breathing and respiratory function stable Cardiovascular status: blood pressure returned to baseline and stable Postop Assessment: no headache and no apparent nausea or vomiting Anesthetic complications: no Comments: Late entry   No notable events documented.   Last Vitals:  Vitals:   09/08/22 1041 09/08/22 1147  BP: (!) 184/61 (!) 178/65  Pulse: 61 66  Resp: 14 11  Temp:  36.7 C  SpO2: 100% 100%    Last Pain:  Vitals:   09/08/22 1147  TempSrc: Oral  PainSc: 0-No pain                 Windell Norfolk

## 2022-09-08 NOTE — Discharge Instructions (Signed)
Please discharge patient when stable, will follow up today with Dr. Wrzosek at the [facility] office immediately following discharge.  Leave shield in place until visit.  All paperwork with discharge instructions will be given at the office.  [facility] Address:  [address]  [place], [place] [address]  

## 2022-09-08 NOTE — Interval H&P Note (Signed)
History and Physical Interval Note:  09/08/2022 11:21 AM  Leah Ferrell [name]  has presented today for surgery, with the diagnosis of combined forms age related cataract, left eye.  The various methods of treatment have been discussed with the patient and family. After consideration of risks, benefits and other options for treatment, the patient has consented to  Procedure(s) with comments: CATARACT EXTRACTION PHACO AND INTRAOCULAR LENS PLACEMENT (IOC) (Left) - Patient is requesting for a later appointment. as a surgical intervention.  The patient's history has been reviewed, patient examined, no change in status, stable for surgery.  I have reviewed the patient's chart and labs.  Questions were answered to the patient's satisfaction.     Fabio Pierce

## 2022-09-08 NOTE — Anesthesia Preprocedure Evaluation (Signed)
Anesthesia Evaluation  Patient identified by MRN, date of birth, ID band Patient awake    Reviewed: Allergy & Precautions, H&P , NPO status , Patient's Chart, lab work & pertinent test results, reviewed documented beta blocker date and time   History of Anesthesia Complications (+) PONV and history of anesthetic complications  Airway Mallampati: II  TM Distance: >3 FB Neck ROM: full    Dental no notable dental hx.    Pulmonary neg pulmonary ROS, shortness of breath, former smoker   Pulmonary exam normal breath sounds clear to auscultation       Cardiovascular Exercise Tolerance: Good hypertension, + angina  + CAD, + Past MI, + CABG, + Peripheral Vascular Disease and +CHF  + dysrhythmias  Rhythm:regular Rate:Normal     Neuro/Psych negative neurological ROS  negative psych ROS   GI/Hepatic negative GI ROS, Neg liver ROS,,,  Endo/Other  negative endocrine ROSdiabetes    Renal/GU negative Renal ROS  negative genitourinary   Musculoskeletal   Abdominal   Peds  Hematology negative hematology ROS (+)   Anesthesia Other Findings   Reproductive/Obstetrics negative OB ROS                             Anesthesia Physical Anesthesia Plan  ASA: 3  Anesthesia Plan: MAC   Post-op Pain Management:    Induction:   PONV Risk Score and Plan:   Airway Management Planned:   Additional Equipment:   Intra-op Plan:   Post-operative Plan:   Informed Consent: I have reviewed the patients History and Physical, chart, labs and discussed the procedure including the risks, benefits and alternatives for the proposed anesthesia with the patient or authorized representative who has indicated his/her understanding and acceptance.     Dental Advisory Given  Plan Discussed with: CRNA  Anesthesia Plan Comments:        Anesthesia Quick Evaluation

## 2022-09-08 NOTE — Op Note (Signed)
Date of procedure: 09/08/22  Pre-operative diagnosis: Visually significant age-related combined cataract, Left Eye (H25.812)  Post-operative diagnosis:  Visually significant age-related combined cataract, Left Eye (H25.812) 2.   Pain and inflammation following cataract surgery, Left Eye (H57.12)  Procedure:  Removal of cataract via phacoemulsification and insertion of intra-ocular lens Alcon SY60WF +22.0D into the capsular bag of the Left Eye 2. Placement of Dextenza Implant, Left Lower Lid  Attending surgeon: Rudy Jew. Nevelyn Mellott, MD, MA  Anesthesia: MAC, Topical Akten  Complications: None  Estimated Blood Loss: <20mL (minimal)  Specimens: None  Implants: As above  Indications:  Visually significant age-related cataract, Left Eye  Procedure:  The patient was seen and identified in the pre-operative area. The operative eye was identified and dilated.  The operative eye was marked.  Topical anesthesia was administered to the operative eye.     The patient was then to the operative suite and placed in the supine position.  A timeout was performed confirming the patient, procedure to be performed, and all other relevant information.   The patient's face was prepped and draped in the usual fashion for intra-ocular surgery.  A lid speculum was placed into the operative eye and the surgical microscope moved into place and focused.  An inferotemporal paracentesis was created using a 20 gauge paracentesis blade.  Shugarcaine was injected into the anterior chamber.  Viscoelastic was injected into the anterior chamber.  A temporal clear-corneal main wound incision was created using a 2.33mm microkeratome.  A continuous curvilinear capsulorrhexis was initiated using an irrigating cystitome and completed using capsulorrhexis forceps.  Hydrodissection and hydrodeliniation were performed.  Viscoelastic was injected into the anterior chamber.  A phacoemulsification handpiece and a chopper as a second  instrument were used to remove the nucleus and epinucleus. The irrigation/aspiration handpiece was used to remove any remaining cortical material.   The capsular bag was reinflated with viscoelastic, checked, and found to be intact.  The intraocular lens was inserted into the capsular bag.  The irrigation/aspiration handpiece was used to remove any remaining viscoelastic.  The clear corneal wound and paracentesis wounds were then hydrated and checked with Weck-Cels to be watertight.   The lid-speculum was removed. The lower punctum was dilated. A Dextenza implant was placed in the lower canaliculus without complication. Maxitrol drops were placed in the operative eye.  The drape was removed.  The patient's face was cleaned with a wet and dry 4x4.   A clear shield was taped over the eye. The patient was taken to the post-operative care unit in good condition, having tolerated the procedure well.  Post-Op Instructions: The patient will follow up at [facility] for a same day post-operative evaluation and will receive all other orders and instructions.

## 2022-09-08 NOTE — Transfer of Care (Signed)
Immediate Anesthesia Transfer of Care Note  Patient: Leah Ferrell  Procedure(s) Performed: CATARACT EXTRACTION PHACO AND INTRAOCULAR LENS PLACEMENT (IOC) (Left: Eye)  Patient Location: Short Stay  Anesthesia Type:MAC  Level of Consciousness: awake, alert , and oriented  Airway & Oxygen Therapy: Patient Spontanous Breathing  Post-op Assessment: Report given to RN and Post -op Vital signs reviewed and stable  Post vital signs: Reviewed and stable  Last Vitals:  Vitals Value Taken Time  BP    Temp    Pulse    Resp    SpO2      Last Pain:  Vitals:   09/08/22 1033  PainSc: 0-No pain         Complications: No notable events documented.

## 2022-09-08 NOTE — Anesthesia Procedure Notes (Signed)
Procedure Name: MAC Date/Time: 09/08/2022 11:28 AM  Performed by: Julian Reil, CRNAPre-anesthesia Checklist: Patient identified, Emergency Drugs available, Suction available and Patient being monitored Patient Re-evaluated:Patient Re-evaluated prior to induction Oxygen Delivery Method: Nasal cannula Placement Confirmation: positive ETCO2

## 2022-09-09 ENCOUNTER — Other Ambulatory Visit: Payer: Self-pay | Admitting: Pharmacist

## 2022-09-09 DIAGNOSIS — E1151 Type 2 diabetes mellitus with diabetic peripheral angiopathy without gangrene: Secondary | ICD-10-CM

## 2022-09-09 MED ORDER — TRESIBA FLEXTOUCH 100 UNIT/ML ~~LOC~~ SOPN
15.0000 [IU] | PEN_INJECTOR | Freq: Every day | SUBCUTANEOUS | 0 refills | Status: DC
Start: 2022-09-09 — End: 2023-08-19

## 2022-09-09 MED ORDER — OZEMPIC (2 MG/DOSE) 8 MG/3ML ~~LOC~~ SOPN
2.0000 mg | PEN_INJECTOR | SUBCUTANEOUS | 0 refills | Status: DC
Start: 2022-09-09 — End: 2023-11-11

## 2022-09-09 NOTE — Progress Notes (Signed)
   09/09/2022 Name: Leah Ferrell MRN: [id number] DOB: 29-Oct-1950  Diabetes Type 2-medication access  Evaristo Bury & Ozempic are having shipment delays through novo nordisk patient assistance company.  30 day vouchers to be called in to local [facility].  CPhT to call in 30-day vouchers.  Appreciate assistance.  Rxs for Tresiba/Ozempic send to pharmacy.  Reached out to novo n[employer] regarding patients shipments.   Fiasp shipped 09/08/22 - tracking [phone]   Ozempic and Evaristo Bury are having supply issues. Rep was able to offer two 30-day vouchers for these medications to be filled at the pharmacy while shipments process. Medications will need to be sent to patients preferred pharmacy. Let me know and I can call in the copay cards once complete!   BIN: [id number]  PCN: [id number]  GRP: [id number]  Voucher ID: [id number]  Medication: Evaristo Bury 200U/ml FlexTouch   BIN: [id number]  PCN: [id number]  GRP: [id number]  Voucher ID: [id number]  Medication: Ozempic 2 mg/mL (1 pen X 27mL/Pen)    Will f/u with patient in 4-6 weeks  Kieth Brightly, PharmD, BCACP Clinical Pharmacist, [facility]

## 2022-09-10 ENCOUNTER — Encounter (HOSPITAL_COMMUNITY): Payer: Self-pay | Admitting: Ophthalmology

## 2022-09-11 NOTE — Telephone Encounter (Signed)
Called in vouchers to patients pharmacy. Copays $0  Spoke with patient & she is aware. She will get by as soon as possible to pickup.

## 2022-09-15 ENCOUNTER — Other Ambulatory Visit: Payer: Self-pay | Admitting: Family

## 2022-09-15 DIAGNOSIS — E1169 Type 2 diabetes mellitus with other specified complication: Secondary | ICD-10-CM

## 2022-09-16 ENCOUNTER — Telehealth: Payer: Self-pay

## 2022-09-16 NOTE — Telephone Encounter (Signed)
Patient informed we have received four boxes of Ozempic and two boxes of [place] and they will be placed in the refrigerator for pick up.

## 2022-09-24 ENCOUNTER — Other Ambulatory Visit: Payer: Self-pay | Admitting: Cardiovascular Disease

## 2022-09-30 ENCOUNTER — Ambulatory Visit: Payer: Medicare Other | Attending: Cardiovascular Disease | Admitting: Cardiovascular Disease

## 2022-09-30 VITALS — BP 158/62 | HR 66 | Ht 62.5 in | Wt 202.2 lb

## 2022-09-30 DIAGNOSIS — I739 Peripheral vascular disease, unspecified: Secondary | ICD-10-CM | POA: Diagnosis not present

## 2022-09-30 DIAGNOSIS — R0989 Other specified symptoms and signs involving the circulatory and respiratory systems: Secondary | ICD-10-CM | POA: Diagnosis not present

## 2022-09-30 DIAGNOSIS — I2510 Atherosclerotic heart disease of native coronary artery without angina pectoris: Secondary | ICD-10-CM

## 2022-09-30 DIAGNOSIS — I150 Renovascular hypertension: Secondary | ICD-10-CM | POA: Diagnosis not present

## 2022-09-30 DIAGNOSIS — E785 Hyperlipidemia, unspecified: Secondary | ICD-10-CM

## 2022-09-30 NOTE — Progress Notes (Signed)
Cardiology Office Note   Date:  09/30/2022   ID:  [name], DOB 20-Jul-1950, MRN [id number]  PCP:  Junie Spencer, FNP  Cardiologist: Dr. Kirke Corin   No chief complaint on file.    History of Present Illness: Leah Ferrell is a 72 y.o. female who presents for a followup visit regarding peripheral arterial disease and coronary artery disease.  She has known history of coronary artery disease status post drug-eluting stent placement to the LAD X 2 with subsequent CABG in May 2022. She is known to have peripheral arterial disease status post left common iliac artery stent placement followed by left common femoral artery resection and bypass from left external iliac to the left profunda done by Dr. Hart Rochester in 2015. She is known to have chronically occluded bilateral SFAs being managed medically.  She had worsening left leg claudication in 2018 with elevated velocity at the proximal anastomosis of the graft.   Angiography in March 2018 showed mild to moderate calcified common iliac artery disease with patent stent in the left common iliac artery. There was severe stenosis in the left common femoral artery at the proximal anastomosis of the bypass to the profunda. I performed successful angioplasty and drug-coated balloon angioplasty.   She is status post bilateral renal artery stenting in January 2021 due to refractory hypertension and recurrent heart failure.  She had significant improvement in blood pressure control as well as heart failure symptoms since then.    Lower extremity angiography in March of 2022 showed patent renal artery stents with significant restenosis on the right side, severe bilateral heavily calcified ostial common iliac artery disease extending into the distal aorta.  The iliac arteries were treated by successful kissing stent placement.  On the right side, there was moderate common femoral artery stenosis as well as chronically occluded SFA with reconstitution  distally via collaterals from the profunda, short occlusion of above-the-knee popliteal artery with collaterals and three-vessel runoff below the knee.  On the left side, there was flush occlusion of the SFA with collaterals from the profunda and two-vessel runoff below the knee. She underwent CABG in May, 2022 with LIMA to LAD, SVG to OM and SVG to diagonal.  Most recent lower extremity arterial Doppler studies in October 2023 showed a decrease in ABI to the 0.4 range bilaterally.  Her ABIs were in the 0.5 range before.  Duplex was overall limited due to heavy calcifications of the iliac arteries.  She was noted to have moderately elevated velocities in the external iliac arteries.  She has been doing reasonably well with no chest pain or worsening dyspnea.  She has stable bilateral leg claudication and continues to have neuropathy symptoms.   Past Medical History:  Diagnosis Date   CAD (coronary artery disease)    a. 04/2012 NSTEMI: s/p DES to LAD.   Chronic diastolic CHF (congestive heart failure) (HCC)    Diabetes mellitus without complication (HCC)    Dysrhythmia    Environmental and seasonal allergies    Hyperlipidemia    Hypertension    Leukocytosis    Morbid obesity (HCC)    NSTEMI (non-ST elevated myocardial infarction) (HCC) 04/27/2012   PONV (postoperative nausea and vomiting)    PVD (peripheral vascular disease) (HCC)    a. 04/2012 ABI: R 0.49, L 0.39. Angiography 06/14: Significant ostial left common iliac artery stenosis extending into the distal aorta, severe left common femoral artery stenosis, bilateral SFA occlusion with heavy calcifications. Functionally, one-vessel runoff bilaterally  below the knee   Renal artery stenosis [facility])    s/p angioplasty/stenting bilateral renal arteries 03/16/19   Shortness of breath     Past Surgical History:  Procedure Laterality Date   ABDOMINAL AORTAGRAM N/A 07/21/2012   Procedure: ABDOMINAL Ronny Flurry;  Surgeon: Iran Ouch, MD;   Location: [facility] CATH LAB;  Service: Cardiovascular;  Laterality: N/A;   ABDOMINAL AORTOGRAM W/LOWER EXTREMITY N/A 05/09/2020   Procedure: ABDOMINAL AORTOGRAM W/LOWER EXTREMITY;  Surgeon: Iran Ouch, MD;  Location: [facility] INVASIVE CV LAB;  Service: Cardiovascular;  Laterality: N/A;   CARDIAC CATHETERIZATION     stents X 2   CATARACT EXTRACTION W/PHACO Left 09/08/2022   Procedure: CATARACT EXTRACTION PHACO AND INTRAOCULAR LENS PLACEMENT (IOC) with placement of Corticosteroid;  Surgeon: Fabio Pierce, MD;  Location: [facility] ORS;  Service: Ophthalmology;  Laterality: Left;  CDE 8.48   CORONARY ANGIOGRAM  04/27/2012   Procedure: CORONARY ANGIOGRAM;  Surgeon: Vesta Mixer, MD;  Location: [facility] CATH LAB;  Service: Cardiovascular;;   CORONARY ARTERY BYPASS GRAFT N/A 06/25/2020   Procedure: CORONARY ARTERY BYPASS GRAFTING (CABG)X3. LEFT INTERNAL MAMMARY ARTERY. RIGHT ENDOSCOPIC SAPHENOUS VEIN HARVESTING.;  Surgeon: Linden Dolin, MD;  Location: [facility] OR;  Service: Open Heart Surgery;  Laterality: N/A;   ENDARTERECTOMY FEMORAL Left 11/02/2013   Procedure: RESECTION LEFT COMMON FEMORAL ARTERY AND INSERTION OF INTERPOSITION 7mm HEMASHIELD GRAFT FROM LEFT EXTERNAL  ILIAC ARTERY TO LEFT PROFUNDA  FEMORIS ARTERY;  Surgeon: Pryor Ochoa, MD;  Location: [facility] OR;  Service: Vascular;  Laterality: Left;   EYE SURGERY Right 2017   FRACTURE SURGERY Right Jul 07, 2014   Right Foot-  [place] Home  by Heat duct   ILIAC ARTERY STENT Left 08/31/2013   LAD stent     LEFT HEART CATH AND CORONARY ANGIOGRAPHY N/A 05/09/2020   Procedure: LEFT HEART CATH AND CORONARY ANGIOGRAPHY;  Surgeon: Iran Ouch, MD;  [facility]: [facility] INVAS[facility] CV LAB;  Service: Cardiovascular;  Laterality: N/A;   LEFT HEART CATHETERIZATION WITH CORONARY ANGIOGRAM N/A 04/23/2012   Procedure: LEFT HEART CATHETERIZATION WITH CORONARY ANGIOGRAM;  Surgeon: Peter M Swaziland, MD;  Location[facility] C[facility] LAB;  Service: Cardiovascular;  Laterality: N/A;   LOWER EXTREMITY  ANGIOGRAM Left 08/31/2013   Procedure: LOWER EXTREMITY ANGIOGRAM;  Surgeon: Iran Ouch, MD;  Location: [facility] CATH [facility];  Service: Cardiovascular;  Laterality: Left;   PERCUTANEOUS STENT INTERVENTION Left 08/31/2013   Procedure: PERCUTANEOUS STENT INTERVENTION;  Surgeon: Iran Ouch, MD;  Location: [facility] CATH LAB;  Service: Cardiovascular;  Laterality: Left;  Common Iliac artery   PERIPHERAL VASCULAR CATHETERIZATION N/A 03/19/2016   Procedure: Abdominal Aortogram w/Lower Extremity;  Surgeon: Iran Ouch, MD;  [facility]: [facility] INVASIVE CV LAB;  Service: Cardiovascular;  Laterality: N/A;   PERIPHERAL VASCULAR CATHETERIZATION  03/19/2016   Procedure: Peripheral Vascular Balloon Angioplasty-CFA Left;  Surgeon: Iran Ouch, MD;  Location: [facility] INV[facility] CV LAB;  Service: Cardiovascular;;   PERIPHERAL VASCULAR INTERVENTION Bilateral 03/16/2019   Procedure: PERIPHERAL VASCULAR INTERVENTION;  Surgeon: Iran Ouch, MD;  [facility]: [facility] CV LAB;  Service: Cardiovascular;  Laterality: Bilateral;  RENAL   PERIPHERAL VASCULAR INTERVENTION Bilateral 05/09/2020   Procedure: PERIPHERAL VASCULAR INTERVENTION;  Surgeon: Iran Ouch, MD;  [facility]: [facility] INVAS[facility] CV LAB;  Service: Cardiovascular;  Laterality: Bilateral;  Iliac   RENAL ANGIOGRAPHY  03/16/2019   RENAL ANGIOGRAPHY Bilateral 03/16/2019   Procedure: RENAL ANGIOGRAPHY;  Surgeon: Iran Ouch, MD;  [facility]: [facility] INVAS[facility] CV LAB;  Service: Cardiovascular;  Laterality: Bilateral;   TEE WITHOUT CARDIOVERSION N/A 06/25/2020   Procedure: TRANSESOPHAGEAL ECHOCARDIOGRAM (TEE);  Surgeon: Linden Dolin, MD;  Location: [facility] OR;  Service: Open Heart Surgery;  Laterality: N/A;   TUMOR REMOVAL     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 20 tablet 0    albuterol (VENTOLIN HFA) 108 (90 Base) MCG/ACT inhaler INHALE 1 TO 2 PUFFS BY MOUTH EVERY 6 HOURS AS NEEDED FOR WHEEZING AND FOR SHORTNESS OF BREATH 18 g 0   amLODipine (NORVASC) 5 MG tablet Take 1 tablet by mouth once daily 90 tablet 0   Ascorbic Acid (VITAMIN C) 1000 MG tablet Take 1,000 mg by mouth 3 (three) times daily.      aspirin EC 81 MG tablet Take 81 mg by mouth daily. In the morning     BD ULTRA-FINE PEN NEEDLES 29G X 12.7MM MISC USE PENNEEDLES 3 TIMES  DAILY AS DIRECTED 270 each 1   blood glucose meter kit and supplies Dispense based on patient and insurance preference. Use up to four times daily as directed. (FOR ICD-10 E10.9, E11.9). 1 each 0   Calcium Carbonate-Vit D-Min (CALTRATE 600+D PLUS MINERALS) 600-800 MG-UNIT TABS Take 1 tablet by mouth 3 (three) times daily.      carvedilol (COREG) 6.25 MG tablet Take 1 tablet by mouth twice daily 180 tablet 0   Cholecalciferol (VITAMIN D-3) 125 MCG (5000 UT) TABS Take 5,000 Units by mouth daily.     clopidogrel (PLAVIX) 75 MG tablet Take 1 tablet by mouth once daily 90 tablet 0   dapagliflozin propanediol (FARXIGA) 5 MG TABS tablet Take 1 tablet (5 mg total) by mouth daily before breakfast. 90 tablet 1   fish oil-omega-3 fatty acids 1000 MG capsule Take 1 g by mouth daily.      glucose blood (GLUCOSE METER TEST) test strip Use 6-10 times a day, Uses ReliOn 900 each 12   halobetasol (ULTRAVATE) 0.05 % cream Apply 1 application topically daily as needed (For rash).     hydroxypropyl methylcellulose (ISOPTO TEARS) 2.5 % ophthalmic solution Place 1 drop into both eyes 3 (three) times daily as needed (for dry eyes).     insulin aspart (FIASP FLEXTOUCH) 100 UNIT/ML FlexTouch Pen Inject 20-50 Units into the skin 3 (three) times daily before meals.     insulin degludec (TRESIBA FLEXTOUCH) 100 UNIT/ML FlexTouch Pen Inject 15-30 Units into the skin daily. 15 mL 0   Insulin Syringe 27G X 1/2" 0.5 ML MISC Use with Fiasp insulin TID 100 each 3    lisinopril (ZESTRIL) 20 MG tablet Take 1 tablet by mouth twice daily 180 tablet 0   montelukast (SINGULAIR) 10 MG tablet Take 1 tablet by mouth once daily with breakfast 90 tablet 0   ondansetron (ZOFRAN ODT) 4 MG disintegrating tablet Take 1 tablet (4 mg total) by mouth every 8 (eight) hours as needed for nausea or vomiting. 20 tablet 0   RELION PEN NEEDLES 29G X MISC USE 1 NEEDLE TO INJECT INSULIN SUBCUTANEOUSLY 3 TIMES DAILY (Patient taking differently: 1 Device by Subconjunctival route 3 (three) times daily.) 50 each 5   rosuvastatin (CRESTOR) 40 MG tablet Take 1 tablet by mouth once daily 90 tablet 0   Semaglutide, 2 MG/DOSE, (OZEMPIC, 2 MG/DOSE,)  8 MG/3ML SOPN Inject 2 mg into the skin once a week. 3 mL 0   No current facility-administered medications for this visit.    Allergies:   Tape    Social History:  The patient  reports that she quit smoking about 21 years ago. Her smoking use included cigarettes. She started smoking about 58 years ago. She has a 36.8 pack-year smoking history. She has never used smokeless tobacco. She reports that she does not drink alcohol and does not use drugs.   Family History:  The patient's family history includes Alcohol abuse in her brother; Diabetes in her brother, daughter, maternal grandmother, mother, and son; Heart attack in her maternal grandfather; Heart disease in her brother; Hyperlipidemia in her mother and son; Hypertension in her daughter, mother, and son; Other in her sister and sister; Peripheral vascular disease in her mother.    ROS:  Please see the history of present illness.   Otherwise, review of systems are positive for none.   All other systems are reviewed and negative.    PHYSICAL EXAM: VS:  BP (!) 158/62 (BP Location: Left Arm, Patient Position: Sitting, Cuff Size: Normal)   Pulse 66   Ht 5' 2.5" (1.588 m)   Wt 202 lb 3.2 oz (91.7 kg)   SpO2 95%   BMI 36.39 kg/m  , BMI Body mass index is 36.39 kg/m. GEN: Well  nourished, well developed, in no acute distress  HEENT: normal  Neck: no JVD or masses.  Right carotid bruit. Cardiac: RRR; no  rubs, or gallops , trace edema . There is 2/6 systolic ejection murmur in the aortic area.  Respiratory:  clear to auscultation bilaterally, normal work of breathing GI: soft, nontender, nondistended, + BS MS: no deformity or atrophy  Skin: warm and dry, no rash Neuro:  Strength and sensation are intact Psych: euthymic mood, full affect Distal pulses are not palpable.    EKG:  EKG is not ordered today.    Recent Labs: 07/18/2022: ALT 36; BUN 11; Creatinine, Ser 0.81; Hemoglobin 15.1; Platelets 300; Potassium 4.9; Sodium 137; TSH 2.170    Lipid Panel    Component Value Date/Time   CHOL 169 07/18/2022 1159   TRIG 139 07/18/2022 1159   TRIG 135 09/16/2012 1322   HDL 78 07/18/2022 1159   HDL 60 09/16/2012 1322   CHOLHDL 2.2 07/18/2022 1159   CHOLHDL 3.2 05/06/2012 1713   VLDL 32 05/06/2012 1713   LDLCALC 68 07/18/2022 1159   LDLCALC 92 09/16/2012 1322      Wt Readings from Last 3 Encounters:  09/30/22 202 lb 3.2 oz (91.7 kg)  09/03/22 199 lb 15.3 oz (90.7 kg)  07/18/22 200 lb (90.7 kg)           No data to display            ASSESSMENT AND PLAN:  1. Peripheral arterial disease: Status post  kissing stent placement to the common iliac arteries due to severe right calf claudication as well as nonhealing ulceration on the right small toe.  Her ABI is severely reduced to 0.4 range but her claudication improved with an exercise program.  I requested follow-up ABI and aortoiliac duplex to be done in October.  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  She does have evidence of significant restenosis on the right side.  Repeat blood pressure today in the left arm was 142/70.  I requested a follow-up renal artery duplex.  3. Coronary artery disease  involving native coronary arteries without angina: Symptoms  resolved after CABG and she is doing well.  .      4. Hyperlipidemia: Continue high-dose rosuvastatin.  I reviewed most recent lipid profile done in January which showed an LDL of 78.  5.  Right carotid bruit: Most recent carotid Doppler in February showed mild nonobstructive disease.  Less than 40% bilaterally.   Disposition:   FU with me in 6 month  Signed,  Lorine Bears, MD  09/30/2022 9:52 AM    [facility]

## 2022-09-30 NOTE — Patient Instructions (Signed)
Medication Instructions:  No changes *If you need a refill on your cardiac medications before your next appointment, please call your pharmacy*   Lab Work: None ordered If you have labs (blood work) drawn today and your tests are completely normal, you will receive your results only by: MyChart Message (if you have MyChart) OR A paper copy in the mail If you have any lab test that is abnormal or we need to change your treatment, we will call you to review the results.   Testing/Procedures: Your physician has requested that you have a renal artery duplex. During this test, an ultrasound is used to evaluate blood flow to the kidneys. Take your medications as you usually do  No food after 11PM the night before.  Water is OK. (Don't drink liquids if you have been instructed not to for ANOTHER test). Avoid foods that produce bowel gas, for 24 hours prior to exam (see below). No breakfast, no chewing gum, no smoking or carbonated beverages. Patient may take morning medications with water. Come in for test at least 15 minutes early to register.  Your physician has requested that you have an Aorta/Iliac Duplex.   No food after 11PM the night before.  Water is OK. (Don't drink liquids if you have been instructed not to for ANOTHER test) Avoid foods that produce bowel gas, for 24 hours prior to exam (see below). No breakfast, no chewing gum, no smoking or carbonated beverages. Patient may take morning medications with water. Come in for test at least 15 minutes early to register.  Your physician has requested that you have an ankle brachial index (ABI). During this test an ultrasound and blood pressure cuff are used to evaluate the arteries that supply the arms and legs with blood. Allow thirty minutes for this exam. There are no restrictions or special instructions.     Follow-Up: At [facility], you and your health needs are our priority.  As part of our continuing mission to  provide you with exceptional heart care, we have created designated Provider Care Teams.  These Care Teams include your primary Cardiologist (physician) and Advanced Practice Providers (APPs -  Physician Assistants and Nurse Practitioners) who all work together to provide you with the care you need, when you need it.  We recommend signing up for the patient portal called "MyChart".  Sign up information is provided on this After Visit Summary.  MyChart is used to connect with patients for Virtual Visits (Telemedicine).  Patients are able to view lab/test results, encounter notes, upcoming appointments, etc.  Non-urgent messages can be sent to your provider as well.   To learn more about what you can do with MyChart, go to [email or web address].    Your next appointment:   6 month(s)  Provider:   Lorine Bears, MD

## 2022-10-06 ENCOUNTER — Other Ambulatory Visit: Payer: Self-pay | Admitting: Family

## 2022-10-06 DIAGNOSIS — E1159 Type 2 diabetes mellitus with other circulatory complications: Secondary | ICD-10-CM

## 2022-10-16 DIAGNOSIS — H26491 Other secondary cataract, right eye: Secondary | ICD-10-CM | POA: Diagnosis not present

## 2022-10-21 ENCOUNTER — Ambulatory Visit: Payer: Medicare Other | Admitting: Family

## 2022-10-21 ENCOUNTER — Encounter: Payer: Self-pay | Admitting: Family

## 2022-10-29 ENCOUNTER — Other Ambulatory Visit: Payer: Self-pay | Admitting: Family

## 2022-11-04 ENCOUNTER — Ambulatory Visit: Payer: Medicare Other | Admitting: Family

## 2022-11-10 ENCOUNTER — Other Ambulatory Visit: Payer: Self-pay | Admitting: Family

## 2022-11-13 ENCOUNTER — Other Ambulatory Visit: Payer: Self-pay | Admitting: Family

## 2022-11-13 DIAGNOSIS — E1159 Type 2 diabetes mellitus with other circulatory complications: Secondary | ICD-10-CM

## 2022-11-14 ENCOUNTER — Ambulatory Visit: Payer: Medicare Other | Admitting: Family

## 2022-11-17 ENCOUNTER — Telehealth: Payer: Self-pay | Admitting: Family

## 2022-11-17 NOTE — Telephone Encounter (Signed)
Ozempic and Evaristo Bury shipments should process 12/05/22 (along with pen needles). Fiasp last shipment should process 11/28/22.

## 2022-11-17 NOTE — Telephone Encounter (Signed)
Left patient message regarding next shipment dates.

## 2022-11-20 ENCOUNTER — Telehealth: Payer: Self-pay | Admitting: Cardiovascular Disease

## 2022-11-20 NOTE — Telephone Encounter (Signed)
Patient has been made aware that according to the notes, they do not do the renal in [place]. Message sent to [facility].

## 2022-11-20 NOTE — Telephone Encounter (Signed)
Patient is calling to talk with nurse about exercises in [place] and to whether or not to go back to Dr. Kirke Corin. Says that she spoke [place] in [place]

## 2022-11-20 NOTE — Telephone Encounter (Signed)
Spoke w/ pt.  She states that she received a message from [facility] in [place] that they cannot do her u/s in [place], but did not give any further info, so she is calling to find out what to do. Advised pt that she is sched for aorta/ivc/iliacs u/s on 10/7 and ABI on 10/8 in [place], but I see no documentation that this has been cancelled. She does not know Vicky's last name. Pt is unsure what to do. She states that she would like to speak w/ Misty Stanley, as she might now what the situation is. Advised her that I will make Misty Stanley aware and have her call back w/ any info.

## 2022-11-24 ENCOUNTER — Ambulatory Visit: Payer: Medicare Other | Attending: Cardiovascular Disease

## 2022-11-24 DIAGNOSIS — I739 Peripheral vascular disease, unspecified: Secondary | ICD-10-CM | POA: Diagnosis not present

## 2022-11-25 ENCOUNTER — Ambulatory Visit: Payer: Medicare Other | Attending: Cardiovascular Disease

## 2022-11-25 DIAGNOSIS — I739 Peripheral vascular disease, unspecified: Secondary | ICD-10-CM

## 2022-11-27 LAB — VAS US ABI WITH/WO TBI
Left ABI: 0.48
Right ABI: 0.49

## 2022-11-28 ENCOUNTER — Ambulatory Visit: Payer: Medicare Other | Admitting: Family

## 2022-12-01 ENCOUNTER — Ambulatory Visit (HOSPITAL_COMMUNITY): Payer: Medicare Other

## 2022-12-04 ENCOUNTER — Encounter: Payer: Self-pay | Admitting: *Deleted

## 2022-12-05 ENCOUNTER — Telehealth: Payer: Self-pay | Admitting: Family

## 2022-12-05 NOTE — Telephone Encounter (Signed)
Lmtcb.

## 2022-12-08 ENCOUNTER — Ambulatory Visit (HOSPITAL_COMMUNITY)
Admission: RE | Admit: 2022-12-08 | Discharge: 2022-12-08 | Disposition: A | Discharge status: Home / Self Care | Payer: Medicare Other | Source: Ambulatory Visit | Attending: Cardiovascular Disease | Admitting: Cardiovascular Disease

## 2022-12-08 DIAGNOSIS — I150 Renovascular hypertension: Secondary | ICD-10-CM | POA: Insufficient documentation

## 2022-12-08 DIAGNOSIS — I739 Peripheral vascular disease, unspecified: Secondary | ICD-10-CM | POA: Insufficient documentation

## 2022-12-10 NOTE — Telephone Encounter (Signed)
Patient aware and verbalized understanding. °

## 2022-12-12 ENCOUNTER — Telehealth: Payer: Self-pay | Admitting: Family

## 2022-12-12 NOTE — Telephone Encounter (Signed)
Patient aware and verbalized understanding. °

## 2022-12-12 NOTE — Telephone Encounter (Signed)
Novolog samples for MEAL TIME insulin are here in fridge for pick up (in place of fiasp which is backordered)  Thank you!

## 2022-12-12 NOTE — Telephone Encounter (Signed)
Called patient she is aware Raynelle Fanning has put samples up front for her.

## 2022-12-15 ENCOUNTER — Ambulatory Visit (INDEPENDENT_AMBULATORY_CARE_PROVIDER_SITE_OTHER): Payer: Medicare Other | Admitting: Family

## 2022-12-15 ENCOUNTER — Encounter: Payer: Self-pay | Admitting: Family

## 2022-12-15 VITALS — BP 156/66 | HR 61 | Temp 97.8°F | Ht 62.5 in | Wt 200.4 lb

## 2022-12-15 DIAGNOSIS — I152 Hypertension secondary to endocrine disorders: Secondary | ICD-10-CM

## 2022-12-15 DIAGNOSIS — I739 Peripheral vascular disease, unspecified: Secondary | ICD-10-CM

## 2022-12-15 DIAGNOSIS — Z951 Presence of aortocoronary bypass graft: Secondary | ICD-10-CM

## 2022-12-15 DIAGNOSIS — I252 Old myocardial infarction: Secondary | ICD-10-CM

## 2022-12-15 DIAGNOSIS — E1165 Type 2 diabetes mellitus with hyperglycemia: Secondary | ICD-10-CM | POA: Diagnosis not present

## 2022-12-15 DIAGNOSIS — E1159 Type 2 diabetes mellitus with other circulatory complications: Secondary | ICD-10-CM

## 2022-12-15 DIAGNOSIS — E1151 Type 2 diabetes mellitus with diabetic peripheral angiopathy without gangrene: Secondary | ICD-10-CM

## 2022-12-15 DIAGNOSIS — M542 Cervicalgia: Secondary | ICD-10-CM

## 2022-12-15 DIAGNOSIS — I2510 Atherosclerotic heart disease of native coronary artery without angina pectoris: Secondary | ICD-10-CM

## 2022-12-15 DIAGNOSIS — Z955 Presence of coronary angioplasty implant and graft: Secondary | ICD-10-CM | POA: Diagnosis not present

## 2022-12-15 LAB — BAYER DCA HB A1C WAIVED: HB A1C (BAYER DCA - WAIVED): 6.2 % — ABNORMAL HIGH (ref 4.8–5.6)

## 2022-12-15 NOTE — Patient Instructions (Signed)
Peripheral Vascular Disease  Peripheral vascular disease (PVD) is a disease of the blood vessels. PVD may also be called peripheral artery disease (PAD) or poor circulation. PVD is the blocking or hardening of the arteries anywhere within the circulatory system beyond the heart. This can result in a decreased supply of blood to the arms, legs, and internal organs, such as the stomach or kidneys. However, PVD most often affects a person's lower legs and feet. Without treatment, PVD often worsens. PVD can lead to acute limb ischemia. This occurs when an arm or leg suddenly has trouble getting enough blood. This is a medical emergency. What are the causes? The most common cause of PVD is atherosclerosis. This is a buildup of fatty material and other substances (plaque)inside your arteries. Pieces of plaque can break off from the walls of an artery and become stuck in a smaller artery, blocking blood flow and possibly causing acute limb ischemia. Other common causes of PVD include: Blood clots that form inside the blood vessels. Injuries to blood vessels. Diseases that cause inflammation of blood vessels or cause blood vessel tightening (spasms). What increases the risk? The following factors may make you more likely to develop this condition: A family history of PVD. Common medical conditions, including: High cholesterol. Diabetes. High blood pressure (hypertension). Heart disease. Known atherosclerotic disease in another area of the body. Past injury, such as burns or a broken bone. Other medical conditions, such as: Buerger's disease. This is caused by inflamed blood vessels in your hands and feet. Some forms of arthritis. Birth defects that affect the arteries in your legs. Kidney disease. Using tobacco and nicotine products. Not getting enough exercise. Obesity. Being age 71 or older, or being age 81 or older and having the other risk factors. What are the signs or symptoms? This  condition may cause different symptoms. Your symptoms depend on what body part is not getting enough blood. Common signs and symptoms include: Cramps in your buttocks, legs, and feet. Intermittent claudication. This is pain and weakness in your legs during activity that resolves with rest. Leg pain at rest and leg numbness, tingling, or weakness. Coldness in a leg or foot, especially when compared to the other leg or foot. Skin or hair changes. These can include: Hair loss. Shiny skin. Pale or bluish skin. Thick toenails. Inability to get or maintain an erection (erectile dysfunction). Tiredness (fatigue). Weak pulse or no pulse in the feet. People with PVD are more likely to develop open wounds (ulcers) and sores on their toes, feet, or legs. The ulcers or sores may take longer than normal to heal. How is this diagnosed? PVD is diagnosed based on your signs and symptoms, a physical exam, and your medical history. You may also have other tests to find the cause. Tests include: Ankle-brachial index test.This test compares the blood pressure readings of the legs and arms. This may also include an exercise ankle-brachial index test in which you walk on a treadmill to check your symptoms. Doppler ultrasound. This takes pictures of blood flow through your blood vessels. Imaging studies that use dye to show blood flow. These are: CT angiogram. Magnetic resonance angiogram, or MRA. How is this treated? Treatment for PVD depends on the cause of your condition, how severe your symptoms are, and your age. Underlying causes need to be treated and controlled. These include long-term (chronic) conditions, such as diabetes, high cholesterol, and hypertension. Treatment may include: Lifestyle changes, such as: Quitting tobacco use. Exercising regularly. Following a  low-fat, low-cholesterol diet. Not drinking alcohol. Taking medicines, such as: Blood thinners to prevent blood clots. Medicines to  improve blood flow. Medicines to improve cholesterol levels. Procedures, such as: Angioplasty. This uses an inflated balloon to open a blocked artery and improve blood flow. Stent implant. This inserts a small mesh tube to keep a blocked artery open. Peripheral bypass surgery. This reroutes blood flow around a blocked artery. Surgery to remove dead tissue from an infected wound (debridement). Amputation. This is surgical removal of the affected limb. It may be necessary in cases of acute limb ischemia when medical or surgical treatments have not helped. Follow these instructions at home: Medicines Take over-the-counter and prescription medicines only as told by your health care provider. If you are taking blood thinners: Talk with your health care provider before you take any medicines that contain aspirin or NSAIDs, such as ibuprofen. These medicines increase your risk for dangerous bleeding. Take your medicine exactly as told, at the same time every day. Avoid activities that could cause injury or bruising, and follow instructions about how to prevent falls. Wear a medical alert bracelet or carry a card that lists what medicines you take. Lifestyle     Exercise regularly. Ask your health care provider about some good activities for you. Talk with your health care provider about maintaining a healthy weight. If needed, ask about losing weight. Eat a diet that is low in fat and cholesterol. If you need help, talk with your health care provider. Do not drink alcohol. Do not use any products that contain nicotine or tobacco. These products include cigarettes, chewing tobacco, and vaping devices, such as e-cigarettes. If you need help quitting, ask your health care provider. General instructions Take good care of your feet. To do this: Wear comfortable shoes that fit well. Check your feet often for any cuts or sores. Get an annual influenza vaccine. Keep all follow-up visits. This is  important. Where to find more information Society for Vascular Surgery: vascular.org American Heart Association: heart.org National Heart, Lung, and Blood Institute: [employer] Contact a health care provider if: You have leg cramps while walking. You have leg pain when you rest. Your leg or foot feels cold. Your skin changes color. You have erectile dysfunction. You have cuts or sores on your legs or feet that do not heal. Get help right away if: You have sudden changes in color and feeling of your arms or legs, such as: Your arm or leg turns cold, numb, and blue. Your arm or leg becomes red, warm, swollen, painful, or numb. You have any symptoms of a stroke. "BE FAST" is an easy way to remember the main warning signs of a stroke: B - Balance. Signs are dizziness, sudden trouble walking, or loss of balance. E - Eyes. Signs are trouble seeing or a sudden change in vision. F - Face. Signs are sudden weakness or numbness of the face, or the face or eyelid drooping on one side. A - Arms. Signs are weakness or numbness in an arm. This happens suddenly and usually on one side of the body. S - Speech. Signs are sudden trouble speaking, slurred speech, or trouble understanding what people say. T - Time. Time to call emergency services. Write down what time symptoms started. You have other signs of a stroke, such as: A sudden, severe headache with no known cause. Nausea or vomiting. Seizure. You have chest pain or trouble breathing. These symptoms may represent a serious problem that is an emergency.  Do not wait to see if the symptoms will go away. Get medical help right away. Call your local emergency services (911 in the [place] Do not drive yourself to the hospital. Summary Peripheral vascular disease (PVD) is a disease of the blood vessels. PVD is the blocking or hardening of the arteries anywhere within the circulatory system beyond the heart. PVD may cause different symptoms. Your  symptoms depend on what part of your body is not getting enough blood. Treatment for PVD depends on what caused it, how severe your symptoms are, and your age. This information is not intended to replace advice given to you by your health care provider. Make sure you discuss any questions you have with your health care provider. Document Revised: 07/12/2019 Document Reviewed: 08/08/2019 Elsevier Patient Education  2024 [employer].

## 2022-12-15 NOTE — Progress Notes (Signed)
Subjective:    Patient ID: Leah Ferrell, female    DOB: June 16, 1950, 72 y.o.   MRN: [id number]  Chief Complaint  Patient presents with   Medical Management of Chronic Issues   Pt presents to the office today for chronic follow up. Pt has CHF, CAD and hx MI and is followed by Cardiologists every 6 months. PT is followed by Vascular  for PVD and PAD. PT states these are stable.    Pt had a renal angiography on 03/16/19.    She had critical lower limb ischemia and had abdominal aortogram with lower extremity. 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    Pt is morbid obese because her BMI is 36 and has DM and HTN.   Hypertension This is a chronic problem. The current episode started more than 1 year ago. The problem is unchanged. The problem is uncontrolled. Associated symptoms include neck pain and peripheral edema (when staying too long). Pertinent negatives include no blurred vision, malaise/fatigue or shortness of breath. Risk factors for coronary artery disease include diabetes mellitus, dyslipidemia, obesity and sedentary lifestyle. The current treatment provides moderate improvement. Hypertensive end-organ damage includes PVD.  Diabetes She presents for her follow-up diabetic visit. She has type 2 diabetes mellitus. Associated symptoms include fatigue and foot paresthesias. Pertinent negatives for diabetes include no blurred vision. Symptoms are stable. Diabetic complications include peripheral neuropathy and PVD. Risk factors for coronary artery disease include diabetes mellitus, dyslipidemia, hypertension, sedentary lifestyle and post-menopausal. She is following a generally unhealthy diet. Her overall blood glucose range is 180-200 mg/dl. Eye exam is current.  Congestive Heart Failure Presents for follow-up visit. Associated symptoms include edema and fatigue. Pertinent negatives include no shortness of breath. The symptoms have been stable.  Neck Pain  This is a chronic problem. The current  episode started more than 1 year ago. The problem occurs constantly. The pain is associated with nothing. The pain is present in the right side. The quality of the pain is described as aching. The pain is at a severity of 6/10. The pain is moderate. The symptoms are aggravated by twisting and bending. She has tried bed rest and NSAIDs for the symptoms.      Review of Systems  Constitutional:  Positive for fatigue. Negative for malaise/fatigue.  Eyes:  Negative for blurred vision.  Respiratory:  Negative for shortness of breath.   Musculoskeletal:  Positive for neck pain.  All other systems reviewed and are negative.      Objective:   Physical Exam Vitals reviewed.  Constitutional:      General: She is not in acute distress.    Appearance: She is well-developed. She is obese.  HENT:     Head: Normocephalic and atraumatic.     Right Ear: Tympanic membrane normal.     Left Ear: Tympanic membrane normal.  Eyes:     Pupils: Pupils are equal, round, and reactive to light.  Neck:     Thyroid: No thyromegaly.  Cardiovascular:     Rate and Rhythm: Normal rate and regular rhythm.     Heart sounds: Normal heart sounds. No murmur heard. Pulmonary:     Effort: Pulmonary effort is normal. No respiratory distress.     Breath sounds: Normal breath sounds. No wheezing.  Abdominal:     General: Bowel sounds are normal. There is no distension.     Palpations: Abdomen is soft.     Tenderness: There is no abdominal tenderness.  Musculoskeletal:  General: No tenderness. Normal range of motion.     Cervical back: Normal range of motion and neck supple.     Right lower leg: Edema (trace) present.     Left lower leg: Edema (trace) present.  Skin:    General: Skin is warm and dry.  Neurological:     Mental Status: She is alert and oriented to person, place, and time.     Cranial Nerves: No cranial nerve deficit.     Deep Tendon Reflexes: Reflexes are normal and symmetric.  Psychiatric:         Behavior: Behavior normal.        Thought Content: Thought content normal.        Judgment: Judgment normal.       BP (!) 156/66   Pulse 61   Temp 97.8 F (36.6 C) (Temporal)   Ht 5' 2.5" (1.588 m)   Wt 200 lb 6.4 oz (90.9 kg)   SpO2 97%   BMI 36.07 kg/m      Assessment & Plan:   Leah Ferrell comes in today with chief complaint of Medical Management of Chronic Issues   Diagnosis and orders addressed:  1. Diabetes mellitus with peripheral vascular disease (HCC)  2. Uncontrolled type 2 diabetes mellitus with hyperglycemia (HCC) - CBC with Differential/Platelet - CMP14+EGFR - Lipid panel - TSH - Bayer DCA Hb A1c Waived  3. Hypertension associated with diabetes (HCC)  4. S/P primary angioplasty with coronary stent  5. S/P CABG x 3  6. PVD (peripheral vascular disease) (HCC)  7. Morbid obesity (HCC)  8. Hx of non-ST elevation myocardial infarction (NSTEMI)  9. Coronary artery disease involving native coronary artery of native heart without angina pectoris  10. Neck pain - DG Cervical Spine Complete; Future   Labs pending Continue current medications  Will get neck x-ray when she pick up her insulin  A1C at goal today! Keep up the great work BP elevated today, but decreasing sitting Keep all follow up with specialists  Health Maintenance reviewed Diet and exercise encouraged  Follow up plan: 3 months    Jannifer Rodney, FNP

## 2022-12-16 LAB — CMP14+EGFR
ALT: 21 [IU]/L (ref 0–32)
AST: 21 [IU]/L (ref 0–40)
Albumin: 4.3 g/dL (ref 3.8–4.8)
Alkaline Phosphatase: 63 [IU]/L (ref 44–121)
BUN/Creatinine Ratio: 21 (ref 12–28)
BUN: 17 mg/dL (ref 8–27)
Bilirubin Total: 0.3 mg/dL (ref 0.0–1.2)
CO2: 27 mmol/L (ref 20–29)
Calcium: 10.2 mg/dL (ref 8.7–10.3)
Chloride: 96 mmol/L (ref 96–106)
Creatinine, Ser: 0.82 mg/dL (ref 0.57–1.00)
Globulin, Total: 3.3 g/dL (ref 1.5–4.5)
Glucose: 182 mg/dL — ABNORMAL HIGH (ref 70–99)
Potassium: 4.6 mmol/L (ref 3.5–5.2)
Sodium: 139 mmol/L (ref 134–144)
Total Protein: 7.6 g/dL (ref 6.0–8.5)
eGFR: 76 mL/min/{1.73_m2} (ref >59)

## 2022-12-16 LAB — CBC WITH DIFFERENTIAL/PLATELET
Basophils Absolute: 0 10*3/uL (ref 0.0–0.2)
Basos: 0 %
EOS (ABSOLUTE): 0.3 10*3/uL (ref 0.0–0.4)
Eos: 3 %
Hematocrit: 46.6 % (ref 34.0–46.6)
Hemoglobin: 14.9 g/dL (ref 11.1–15.9)
Immature Grans (Abs): 0 10*3/uL (ref 0.0–0.1)
Immature Granulocytes: 0 %
Lymphocytes Absolute: 2.5 10*3/uL (ref 0.7–3.1)
Lymphs: 25 %
MCH: 31 pg (ref 26.6–33.0)
MCHC: 32 g/dL (ref 31.5–35.7)
MCV: 97 fL (ref 79–97)
Monocytes Absolute: 0.7 10*3/uL (ref 0.1–0.9)
Monocytes: 7 %
Neutrophils Absolute: 6.7 10*3/uL (ref 1.4–7.0)
Neutrophils: 65 %
Platelets: 295 10*3/uL (ref 150–450)
RBC: 4.81 x10E6/uL (ref 3.77–5.28)
RDW: 13.3 % (ref 11.7–15.4)
WBC: 10.1 10*3/uL (ref 3.4–10.8)

## 2022-12-16 LAB — LIPID PANEL
Chol/HDL Ratio: 2.4 ratio (ref 0.0–4.4)
Cholesterol, Total: 174 mg/dL (ref 100–199)
HDL: 73 mg/dL (ref >39)
LDL Chol Calc (NIH): 80 mg/dL (ref 0–99)
Triglycerides: 121 mg/dL (ref 0–149)
VLDL Cholesterol Cal: 21 mg/dL (ref 5–40)

## 2022-12-16 LAB — TSH: TSH: 1.7 u[IU]/mL (ref 0.450–4.500)

## 2022-12-17 ENCOUNTER — Other Ambulatory Visit: Payer: Self-pay | Admitting: Family

## 2022-12-17 ENCOUNTER — Other Ambulatory Visit (HOSPITAL_COMMUNITY): Payer: Self-pay | Admitting: *Deleted

## 2022-12-17 DIAGNOSIS — I150 Renovascular hypertension: Secondary | ICD-10-CM

## 2022-12-17 DIAGNOSIS — E785 Hyperlipidemia, unspecified: Secondary | ICD-10-CM

## 2022-12-18 ENCOUNTER — Telehealth: Payer: Self-pay | Admitting: Family

## 2022-12-18 NOTE — Telephone Encounter (Signed)
Pt calling for another sample of insulin. Please call back when she can pick up

## 2022-12-18 NOTE — Telephone Encounter (Signed)
ONE PEN OF NOVOLOG GIVEN

## 2022-12-19 NOTE — Telephone Encounter (Signed)
Sample given See other pharmd note

## 2022-12-19 NOTE — Telephone Encounter (Signed)
Humalog 30 units with each meal (sample given) Taking tresiba 20 units nightly Patient has supply of tresiba, ozempic Patient states her FBG 150 Most recent A1c was 6.2% (12/15/22) Watching diet/lifestyle more

## 2022-12-22 ENCOUNTER — Ambulatory Visit (INDEPENDENT_AMBULATORY_CARE_PROVIDER_SITE_OTHER): Payer: Medicare Other

## 2022-12-22 ENCOUNTER — Other Ambulatory Visit: Payer: Self-pay | Admitting: Cardiovascular Disease

## 2022-12-22 DIAGNOSIS — M47812 Spondylosis without myelopathy or radiculopathy, cervical region: Secondary | ICD-10-CM | POA: Diagnosis not present

## 2022-12-22 DIAGNOSIS — M4802 Spinal stenosis, cervical region: Secondary | ICD-10-CM | POA: Diagnosis not present

## 2022-12-22 DIAGNOSIS — M5030 Other cervical disc degeneration, unspecified cervical region: Secondary | ICD-10-CM | POA: Diagnosis not present

## 2022-12-22 DIAGNOSIS — M542 Cervicalgia: Secondary | ICD-10-CM | POA: Diagnosis not present

## 2022-12-22 NOTE — Telephone Encounter (Signed)
Refill Request.  

## 2022-12-25 ENCOUNTER — Ambulatory Visit: Payer: Medicare Other

## 2022-12-25 VITALS — Ht 62.0 in | Wt 200.0 lb

## 2022-12-25 DIAGNOSIS — Z0000 Encounter for general adult medical examination without abnormal findings: Secondary | ICD-10-CM

## 2022-12-25 NOTE — Patient Instructions (Signed)
Ms. Laidlaw , Thank you for taking time to come for your Medicare Wellness Visit. I appreciate your ongoing commitment to your health goals. Please review the following plan we discussed and let me know if I can assist you in the future.   Referrals/Orders/Follow-Ups/Clinician Recommendations: Aim for 30 minutes of exercise or brisk walking, 6-8 glasses of water, and 5 servings of fruits and vegetables each day.   This is a list of the screening recommended for you and due dates:  Health Maintenance  Topic Date Due   COVID-19 Vaccine (1 - 2023-24 season) Never done   Zoster (Shingles) Vaccine (1 of 2) 03/17/2023*   Flu Shot  05/18/2023*   Mammogram  01/14/2023   Hemoglobin A1C  06/15/2023   Eye exam for diabetics  07/07/2023   Yearly kidney health urinalysis for diabetes  07/18/2023   Yearly kidney function blood test for diabetes  12/15/2023   Complete foot exam   12/15/2023   Medicare Annual Wellness Visit  12/25/2023   DEXA scan (bone density measurement)  01/15/2024   Cologuard (Stool DNA test)  04/23/2025   DTaP/Tdap/Td vaccine (3 - Td or Tdap) 10/15/2031   Pneumonia Vaccine  Completed   Hepatitis C Screening  Completed   HPV Vaccine  Aged Out  *Topic was postponed. The date shown is not the original due date.    Advanced directives: (Provided) Advance directive discussed with you today. I have provided a copy for you to complete at home and have notarized. Once this is complete, please bring a copy in to our office so we can scan it into your chart. Information on Advanced Care Planning can be found at [facility] [place] Advance Health Care Directives Advance Health Care Directives ([email or web address]    Next Medicare Annual Wellness Visit scheduled for next year: Yes  Insert Preventive Care attachment Insert FALL PREVENTION attachment if needed

## 2022-12-25 NOTE — Progress Notes (Signed)
Subjective:   Leah Ferrell is a 72 y.o. female who presents for Medicare Annual (Subsequent) preventive examination.  Visit Complete: Virtual I connected with  [name] on 12/25/22 by a audio enabled telemedicine application and verified that I am speaking with the correct person using two identifiers.  Patient Location: Home  Provider Location: Home Office  I discussed the limitations of evaluation and management by telemedicine. The patient expressed understanding and agreed to proceed.  Vital Signs: Because this visit was a virtual/telehealth visit, some criteria may be missing or patient reported. Any vitals not documented were not able to be obtained and vitals that have been documented are patient reported.  Patient Medicare AWV questionnaire was completed by the patient on 11/0/2024; I have confirmed that all information answered by patient is correct and no changes since this date.  Cardiac Risk Factors include: advanced age (>66men, >38 women);diabetes mellitus;dyslipidemia;hypertension     Objective:    Today's Vitals   12/25/22 1059  Weight: 200 lb (90.7 kg)  Height: 5\' 2"  (1.575 m)   Body mass index is 36.58 kg/m.     12/25/2022   11:04 AM 09/08/2022   10:33 AM 09/03/2022    9:59 AM 12/23/2021    2:11 PM 11/23/2020    4:18 PM 06/30/2020    8:00 AM 06/21/2020    2:14 PM  Advanced Directives  Does Patient Have a Medical Advance Directive? No No No Yes No No No  Type of Theme park manager;Living will     Copy of Healthcare Power of Attorney in Chart?    No - copy requested     Would patient like information on creating a medical advance directive? Yes (MAU/Ambulatory/Procedural Areas - Information given) No - Patient declined No - Patient declined  No - Patient declined No - Patient declined     Current Medications (verified) Outpatient Encounter Medications as of 12/25/2022  Medication Sig   acetaminophen (TYLENOL) 325  MG tablet Take 2 tablets (650 mg total) by mouth every 6 (six) hours as needed for mild pain (or Fever >/= 101).   albuterol (VENTOLIN HFA) 108 (90 Base) MCG/ACT inhaler INHALE 1 TO 2 PUFFS BY MOUTH EVERY 6 HOURS AS NEEDED FOR WHEEZING AND FOR SHORTNESS OF BREATH   amLODipine (NORVASC) 5 MG tablet Take 1 tablet by mouth once daily   Ascorbic Acid (VITAMIN C) 1000 MG tablet Take 1,000 mg by mouth 3 (three) times daily.    aspirin EC 81 MG tablet Take 81 mg by mouth daily. In the morning   BD ULTRA-FINE PEN NEEDLES 29G X 12.7MM MISC USE PENNEEDLES 3 TIMES  DAILY AS DIRECTED   blood glucose meter kit and supplies Dispense based on patient and insurance preference. Use up to four times daily as directed. (FOR ICD-10 E10.9, E11.9).   Calcium Carbonate-Vit D-Min (CALTRATE 600+D PLUS MINERALS) 600-800 MG-UNIT TABS Take 1 tablet by mouth 3 (three) times daily.    carvedilol (COREG) 6.25 MG tablet Take 1 tablet by mouth twice daily   Cholecalciferol (VITAMIN D-3) 125 MCG (5000 UT) TABS Take 5,000 Units by mouth daily.   clopidogrel (PLAVIX) 75 MG tablet Take 1 tablet by mouth once daily   dapagliflozin propanediol (FARXIGA) 5 MG TABS tablet Take 1 tablet (5 mg total) by mouth daily before breakfast.   fish oil-omega-3 fatty acids 1000 MG capsule Take 1 g by mouth daily.    glucose blood (GLUCOSE METER TEST) test  strip Use 6-10 times a day, Uses ReliOn   halobetasol (ULTRAVATE) 0.05 % cream Apply 1 application topically daily as needed (For rash).   hydroxypropyl methylcellulose (ISOPTO TEARS) 2.5 % ophthalmic solution Place 1 drop into both eyes 3 (three) times daily as needed (for dry eyes).   insulin aspart (FIASP FLEXTOUCH) 100 UNIT/ML FlexTouch Pen Inject 20-50 Units into the skin 3 (three) times daily before meals.   insulin degludec (TRESIBA FLEXTOUCH) 100 UNIT/ML FlexTouch Pen Inject 15-30 Units into the skin daily.   Insulin Syringe 27G X 1/2" 0.5 ML MISC Use with Fiasp insulin TID   lisinopril  (ZESTRIL) 20 MG tablet Take 1 tablet by mouth twice daily   montelukast (SINGULAIR) 10 MG tablet Take 1 tablet by mouth once daily with breakfast   ondansetron (ZOFRAN ODT) 4 MG disintegrating tablet Take 1 tablet (4 mg total) by mouth every 8 (eight) hours as needed for nausea or vomiting.   RELION PEN NEEDLES 29G X MISC USE 1 NEEDLE TO INJECT INSULIN SUBCUTANEOUSLY 3 TIMES DAILY (Patient taking differently: 1 Device by Subconjunctival route 3 (three) times daily.)   rosuvastatin (CRESTOR) 40 MG tablet Take 1 tablet by mouth once daily   Semaglutide, 2 MG/DOSE, (OZEMPIC, 2 MG/DOSE,) 8 MG/3ML SOPN Inject 2 mg into the skin once a week.   No facility-administered encounter medications on file as of 12/25/2022.    Allergies (verified) Tape   History: Past Medical History:  Diagnosis Date   CAD (coronary artery disease)    a. 04/2012 NSTEMI: s/p DES to LAD.   Chronic diastolic CHF (congestive heart failure) (HCC)    Diabetes mellitus without complication (HCC)    Dysrhythmia    Environmental and seasonal allergies    Hyperlipidemia    Hypertension    Leukocytosis    Morbid obesity (HCC)    NSTEMI (non-ST elevated myocardial infarction) (HCC) 04/27/2012   PONV (postoperative nausea and vomiting)    PVD (peripheral vascular disease) (HCC)    a. 04/2012 ABI: R 0.49, L 0.39. Angiography 06/14: Significant ostial left common iliac artery stenosis extending into the distal aorta, severe left common femoral artery stenosis, bilateral SFA occlusion with heavy calcifications. Functionally, one-vessel runoff bilaterally below the knee   Renal artery stenosis (HCC)    s/p angioplasty/stenting bilateral renal arteries 03/16/19   Shortness of breath    Past Surgical History:  Procedure Laterality Date   ABDOMINAL AORTAGRAM N/A 07/21/2012   Procedure: ABDOMINAL Ronny Flurry;  Surgeon: Iran Ouch, MD;  Location: [facility] CATH LAB;  Service: Cardiovascular;  Laterality: N/A;   ABDOMINAL AORTOGRAM  W/LOWER EXTREMITY N/A 05/09/2020   Procedure: ABDOMINAL AORTOGRAM W/LOWER EXTREMITY;  Surgeon: Iran Ouch, MD;  Location: [facility] INVASIVE CV LAB;  Service: Cardiovascular;  Laterality: N/A;   CARDIAC CATHETERIZATION     stents X 2   CATARACT EXTRACTION W/PHACO Left 09/08/2022   Procedure: CATARACT EXTRACTION PHACO AND INTRAOCULAR LENS PLACEMENT (IOC) with placement of Corticosteroid;  Surgeon: Fabio Pierce, MD;  Location: [facility] ORS;  Service: Ophthalmology;  Laterality: Left;  CDE 8.48   CORONARY ANGIOGRAM  04/27/2012   Procedure: CORONARY ANGIOGRAM;  Surgeon: Vesta Mixer, MD;  Location: [facility] CATH LAB;  Service: Cardiovascular;;   CORONARY ARTERY BYPASS GRAFT N/A 06/25/2020   Procedure: CORONARY ARTERY BYPASS GRAFTING (CABG)X3. LEFT INTERNAL MAMMARY ARTERY. RIGHT ENDOSCOPIC SAPHENOUS VEIN HARVESTING.;  Surgeon: Linden Dolin, MD;  Location: [facility] OR;  Service: Open Heart Surgery;  Laterality: N/A;   ENDARTERECTOMY FEMORAL Left 11/02/2013  Procedure: RESECTION LEFT COMMON FEMORAL ARTERY AND INSERTION OF INTERPOSITION 7mm HEMASHIELD GRAFT FROM LEFT EXTERNAL  ILIAC ARTERY TO LEFT PROFUNDA  FEMORIS ARTERY;  Surgeon: Pryor Ochoa, MD;  Location: [facility] OR;  Service: Vascular;  Laterality: Left;   EYE SURGERY Right 2017   FRACTURE SURGERY Right Jul 07, 2014   Right Foot-  Pt.fell  at Home  by Heat duct   ILIAC ARTERY STENT Left 08/31/2013   LAD stent     LEFT HEART CATH AND CORONARY ANGIOGRAPHY N/A 05/09/2020   Procedure: LEFT HEART CATH AND CORONARY ANGIOGRAPHY;  Surgeon: Iran Ouch, MD;  [facility];  Service: Cardiovascular;  Laterality: N/A;   LEFT HEART CATHETERIZATION WITH CORONARY ANGIOGRAM N/A 04/23/2012   Procedure: LEFT HEART CATHETERIZATION WITH CORONARY ANGIOGRAM;  Surgeon: Peter M Swaziland, MD;  Location: [facility] CATH LAB;  Service: Cardiovascular;  Laterality: N/A;   LOWER EXTREMITY ANGIOGRAM Left 08/31/2013   Procedure: LOWER EXTREMITY ANGIOGRAM;  Surgeon: Iran Ouch,  MD;  [facility]: [facility] CATH [facility];  Service: Cardiovascular;  Laterality: Left;   PERCUTANEOUS STENT INTERVENTION Left 08/31/2013   Procedure: PERCUTANEOUS STENT INTERVENTION;  Surgeon: Iran Ouch, MD;  [facility]: [facility] CATH [facility];  Service: Cardiovascular;  Laterality: Left;  Common Iliac artery   PERIPHERAL VASCULAR CATHETERIZATION N/A 03/19/2016   Procedure: Abdominal Aortogram w/Lower Extremity;  Surgeon: Iran Ouch, MD;  [facility];  Service: Cardiovascular;  Laterality: N/A;   PERIPHERAL VASCULAR CATHETERIZATION  03/19/2016   Procedure: Peripheral Vascular Balloon Angioplasty-CFA Left;  Surgeon: Iran Ouch, MD;  [facility];  Service: Cardiovascular;;   PERIPHERAL VASCULAR INTERVENTION Bilateral 03/16/2019   Procedure: PERIPHERAL VASCULAR INTERVENTION;  Surgeon: Iran Ouch, MD;  [facility];  Service: Cardiovascular;  Laterality: Bilateral;  RENAL   PERIPHERAL VASCULAR INTERVENTION Bilateral 05/09/2020   Procedure: PERIPHERAL VASCULAR INTERVENTION;  Surgeon: Iran Ouch, MD;  [facility];  Service: Cardiovascular;  Laterality: Bilateral;  Iliac   RENAL ANGIOGRAPHY  03/16/2019   RENAL ANGIOGRAPHY Bilateral 03/16/2019   Procedure: RENAL ANGIOGRAPHY;  Surgeon: Iran Ouch, MD;  [facility];  Service: Cardiovascular;  Laterality: Bilateral;   TEE WITHOUT CARDIOVERSION N/A 06/25/2020   Procedure: TRANSESOPHAGEAL ECHOCARDIOGRAM (TEE);  Surgeon: Linden Dolin, MD;  Location: [facility] OR;  Service: Open Heart Surgery;  Laterality: N/A;   TUMOR REMOVAL     tumor removed from [place]   Family History  Problem Relation Age of Onset   Diabetes Mother    Hyperlipidemia Mother    Hypertension Mother    Peripheral vascular disease Mother    Other Sister        drowned   Other Sister        drowned   Diabetes Daughter        borderline   Hypertension Daughter    Diabetes Maternal Grandmother     Heart attack Maternal Grandfather    Heart disease Brother    Diabetes Brother    Alcohol abuse Brother    Diabetes Son    Hypertension Son    Hyperlipidemia Son    Breast cancer Neg Hx    Social History   Socioeconomic History   Marital status: Widowed    Spouse name: Not on file   Number of children: 4   Years of education: completed school in [place]    Highest education level: Not on file  Occupational History  Occupation: retired  Tobacco Use   Smoking status: Former    Current packs/day: 0.00    Average packs/day: 1 pack/day for 36.8 years (36.8 ttl pk-yrs)    Types: Cigarettes    Start date: 04/21/1964    Quit date: 02/17/2001    Years since quitting: 21.8   Smokeless tobacco: Never  Vaping Use   Vaping status: Never Used  Substance and Sexual Activity   Alcohol use: No    Alcohol/week: 0.0 standard drinks of alcohol   Drug use: No   Sexual activity: Not Currently    Birth control/protection: Post-menopausal  Other Topics Concern   Not on file  Social History Narrative   Lives alone - one level - all of her children live far away   Social Determinants of Health   Financial Resource Strain: Low Risk  (12/25/2022)   Overall Financial Resource Strain (CARDIA)    Difficulty of Paying Living Expenses: Not hard at all  Food Insecurity: No Food Insecurity (12/25/2022)   Hunger Vital Sign    Worried About Running Out of Food in the Last Year: Never true    Ran Out of Food in the Last Year: Never true  Transportation Needs: No Transportation Needs (12/25/2022)   PRAPARE - Administrator, Civil Service (Medical): No    Lack of Transportation (Non-Medical): No  Physical Activity: Insufficiently Active (12/25/2022)   Exercise Vital Sign    Days of Exercise per Week: 3 days    Minutes of Exercise per Session: 30 min  Stress: No Stress Concern Present (12/25/2022)   [employer] of Occupational Health - Occupational Stress Questionnaire    Feeling of  Stress : Not at all  Social Connections: Moderately Isolated (12/25/2022)   Social Connection and Isolation Panel [NHANES]    Frequency of Communication with Friends and Family: More than three times a week    Frequency of Social Gatherings with Friends and Family: More than three times a week    Attends Religious Services: 1 to 4 times per year    Active Member of [employer] or Organizations: No    Attends Banker Meetings: Never    Marital Status: Widowed    Tobacco Counseling Counseling given: Not Answered   Clinical Intake:  Pre-visit preparation completed: Yes  Pain : No/denies pain     Nutritional Risks: None Diabetes: Yes CBG done?: No Did pt. bring in CBG monitor from home?: No  How often do you need to have someone help you when you read instructions, pamphlets, or other written materials from your doctor or pharmacy?: 1 - Never  Interpreter Needed?: No  Information entered by :: Renie Ora, LPN   Activities of Daily Living    12/25/2022   11:04 AM 09/03/2022   10:07 AM  In your present state of health, do you have any difficulty performing the following activities:  Hearing? 0   Vision? 0   Difficulty concentrating or making decisions? 0   Walking or climbing stairs? 0   Dressing or bathing? 0   Doing errands, shopping? 0 0  Preparing Food and eating ? N   Using the Toilet? N   In the past six months, have you accidently leaked urine? N   Do you have problems with loss of bowel control? N   Managing your Medications? N   Managing your Finances? N   Housekeeping or managing your Housekeeping? N     Patient Care Team: Junie Spencer, FNP  as PCP - General (Nurse Practitioner) Iran Ouch, MD as PCP - Cardiology (Cardiology) Iran Ouch, MD as Consulting Physician (Cardiology) Danella Maiers, [facility] (Pharmacist) [facility], [place]  Indicate any recent Medical Services you may have received from other than [facility] providers in  the past year (date may be approximate).     Assessment:   This is a routine wellness examination for Leah Ferrell.  Hearing/Vision screen Vision Screening - Comments:: Wears rx glasses - up to date with routine eye exams with  Dr.Roser    Goals Addressed             This Visit's Progress    DIET - INCREASE WATER INTAKE   On track    Try to drink 6-8 glasses of water daily.       Depression Screen    12/25/2022   11:03 AM 12/15/2022   11:58 AM 07/18/2022   11:14 AM 04/17/2022   11:28 AM 01/14/2022   11:17 AM 12/23/2021    2:10 PM 10/14/2021   11:30 AM  PHQ 2/9 Scores  PHQ - 2 Score 0 0 0 0 0 0 0  PHQ- 9 Score 0 0  0       Fall Risk    12/25/2022   11:01 AM 12/15/2022   11:58 AM 04/17/2022   11:28 AM 01/14/2022   11:17 AM 12/23/2021    2:08 PM  Fall Risk   Falls in the past year? 0 0 0 0 0  Number falls in past yr: 0 0   0  Injury with Fall? 0 0   0  Risk for fall due to : No Fall Risks No Fall Risks   No Fall Risks  Follow up Falls prevention discussed Falls evaluation completed   Falls prevention discussed    MEDICARE RISK AT HOME: Medicare Risk at Home Any stairs in or around the home?: No If so, are there any without handrails?: No Home free of loose throw rugs in walkways, pet beds, electrical cords, etc?: Yes Adequate lighting in your home to reduce risk of falls?: Yes Life alert?: No Use of a cane, walker or w/c?: No Grab bars in the bathroom?: Yes Shower chair or bench in shower?: Yes Elevated toilet seat or a handicapped toilet?: Yes  TIMED UP AND GO:  Was the test performed?  No    Cognitive Function:        12/25/2022   11:04 AM 12/23/2021    2:11 PM 11/23/2020    4:23 PM 10/08/2018    1:56 PM  6CIT Screen  What Year? 0 points 0 points 0 points 0 points  What month? 0 points 0 points 0 points 0 points  What time? 0 points 0 points 0 points 0 points  Count back from 20 0 points 0 points 0 points 0 points  Months in reverse 0 points 0 points 0  points 0 points  Repeat phrase 0 points 0 points 6 points 4 points  Total Score 0 points 0 points 6 points 4 points    Immunizations Immunization History  Administered Date(s) Administered   Hepatitis B, ADULT 04/11/2017, 05/18/2017   PNEUMOCOCCAL CONJUGATE-20 01/14/2022   Pneumococcal Conjugate-13 11/06/2016   Pneumococcal Polysaccharide-23 10/17/2015   Tdap 08/19/2011, 10/14/2021   Zoster, Live 09/26/2014    TDAP status: Up to date  Flu Vaccine status: Declined, Education has been provided regarding the importance of this vaccine but patient still declined. Advised may receive this vaccine at  local pharmacy or Health Dept. Aware to provide a copy of the vaccination record if obtained from local pharmacy or Health Dept. Verbalized acceptance and understanding.  Pneumococcal vaccine status: Up to date  Covid-19 vaccine status: Declined, Education has been provided regarding the importance of this vaccine but patient still declined. Advised may receive this vaccine at local pharmacy or Health Dept.or vaccine clinic. Aware to provide a copy of the vaccination record if obtained from local pharmacy or Health Dept. Verbalized acceptance and understanding.  Qualifies for Shingles Vaccine? Yes   Zostavax completed No   Shingrix Completed?: No.    Education has been provided regarding the importance of this vaccine. Patient has been advised to call insurance company to determine out of pocket expense if they have not yet received this vaccine. Advised may also receive vaccine at local pharmacy or Health Dept. Verbalized acceptance and understanding.  Screening Tests Health Maintenance  Topic Date Due   COVID-19 Vaccine (1 - 2023-24 season) Never done   Zoster Vaccines- Shingrix (1 of 2) 03/17/2023 (Originally 04/21/2001)   INFLUENZA VACCINE  05/18/2023 (Originally 09/18/2022)   MAMMOGRAM  01/14/2023   HEMOGLOBIN A1C  06/15/2023   OPHTHALMOLOGY EXAM  07/07/2023   Diabetic kidney evaluation -  Urine ACR  07/18/2023   Diabetic kidney evaluation - eGFR measurement  12/15/2023   FOOT EXAM  12/15/2023   Medicare Annual Wellness (AWV)  12/25/2023   DEXA SCAN  01/15/2024   Fecal DNA (Cologuard)  04/23/2025   DTaP/Tdap/Td (3 - Td or Tdap) 10/15/2031   Pneumonia Vaccine 36+ Years old  Completed   Hepatitis C Screening  Completed   HPV VACCINES  Aged Out    Health Maintenance  Health Maintenance Due  Topic Date Due   COVID-19 Vaccine (1 - 2023-24 season) Never done    Colorectal cancer screening: Type of screening: Cologuard. Completed 04/24/2022. Repeat every 3 years  Mammogram status: Completed 01/14/2023. Repeat every year  Bone Density status: Completed 01/14/2022. Results reflect: Bone density results: OSTEOPOROSIS. Repeat every 2 years.  Lung Cancer Screening: (Low Dose CT Chest recommended if Age 37-80 years, 20 pack-year currently smoking OR have quit w/in 15years.) does not qualify.   Lung Cancer Screening Referral: n/a  Additional Screening:  Hepatitis C Screening: does not qualify; Completed 11/06/2016  Vision Screening: Recommended annual ophthalmology exams for early detection of glaucoma and other disorders of the eye. Is the patient up to date with their annual eye exam?  Yes  Who is the provider or what is the name of the office in which the patient attends annual eye exams? Dr.Roser  If pt is not established with a provider, would they like to be referred to a provider to establish care? No .   Dental Screening: Recommended annual dental exams for proper oral hygiene  Diabetic Foot Exam: Diabetic Foot Exam: Overdue, Pt has been advised about the importance in completing this exam. Pt is scheduled for diabetic foot exam on next office visit .  Community Resource Referral / Chronic Care Management: CRR required this visit?  No   CCM required this visit?  No     Plan:     I have personally reviewed and noted the following in the patient's chart:    Medical and social history Use of alcohol, tobacco or illicit drugs  Current medications and supplements including opioid prescriptions. Patient is not currently taking opioid prescriptions. Functional ability and status Nutritional status Physical activity Advanced directives List of other physicians Hospitalizations,  surgeries, and ER visits in previous 12 months Vitals Screenings to include cognitive, depression, and falls Referrals and appointments  In addition, I have reviewed and discussed with patient certain preventive protocols, quality metrics, and best practice recommendations. A written personalized care plan for preventive services as well as general preventive health recommendations were provided to patient.     Lorrene Reid, LPN   16/02/958   After Visit Summary: (MyChart) Due to this being a telephonic visit, the after visit summary with patients personalized plan was offered to patient via MyChart   Nurse Notes: none

## 2022-12-30 ENCOUNTER — Other Ambulatory Visit: Payer: Self-pay | Admitting: Family

## 2022-12-30 ENCOUNTER — Telehealth: Payer: Self-pay | Admitting: Family Medicine

## 2022-12-30 DIAGNOSIS — Z1231 Encounter for screening mammogram for malignant neoplasm of breast: Secondary | ICD-10-CM

## 2022-12-30 NOTE — Telephone Encounter (Unsigned)
Copied from CRM [id number]. Topic: Clinical - Medication Question >> Dec 30, 2022  2:31 PM Herbert Seta B wrote: Reason for CRM: Patient calling states insulin should be ordered and delivered to [facility] office, would like to speak to nurse or pcp. Franki Monte, Pauline Good via [employer] patient assistance program; meds ship to PCP office)

## 2022-12-31 ENCOUNTER — Telehealth: Payer: Self-pay | Admitting: Pharmacist

## 2022-12-31 NOTE — Telephone Encounter (Signed)
Reviewed insulin with patient Leah Ferrell 30 units nightly Rapid acting (has to alternate brand depending on what we have) 20-30 units with meals Ozempic 2mg  weekly Patient is not comfortable increasing basal to even out the 50/50 basal/bolus requirements Will plan to use CGM for patient as she is able/willing Patient to be scheduled for follow up

## 2022-12-31 NOTE — Telephone Encounter (Signed)
Returned call to patient.

## 2023-01-03 ENCOUNTER — Other Ambulatory Visit: Payer: Self-pay | Admitting: Family

## 2023-01-03 DIAGNOSIS — I152 Hypertension secondary to endocrine disorders: Secondary | ICD-10-CM

## 2023-01-06 ENCOUNTER — Telehealth: Payer: Self-pay | Admitting: Family Medicine

## 2023-01-06 ENCOUNTER — Other Ambulatory Visit: Payer: Self-pay | Admitting: Family

## 2023-01-06 DIAGNOSIS — I152 Hypertension secondary to endocrine disorders: Secondary | ICD-10-CM

## 2023-01-06 NOTE — Telephone Encounter (Signed)
Copied from CRM [id number]. Topic: Clinical - Lab/Test Results >> Jan 06, 2023  4:21 PM Alcus Dad H wrote: Reason for CRM: Pt would like a call back from [facility] (nurse) regarding the xray she got her on her neck a few weeks back

## 2023-01-06 NOTE — Telephone Encounter (Signed)
Called patient NA. When patient calls back her results are not back yet let her know they are behind and we will call as soon as we get them

## 2023-01-21 ENCOUNTER — Telehealth: Payer: Self-pay | Admitting: Family

## 2023-01-21 NOTE — Telephone Encounter (Signed)
Lmtcb sample up front to be picked up

## 2023-01-21 NOTE — Telephone Encounter (Signed)
Pt is needing a insulin pin she is out and wanted to know if there was some available a t the office for her to come pick up.

## 2023-01-26 ENCOUNTER — Telehealth: Payer: Self-pay

## 2023-01-26 ENCOUNTER — Ambulatory Visit: Payer: Medicare Other

## 2023-01-26 NOTE — Progress Notes (Deleted)
Pharmacy Medication Assistance Program Note    01/26/2023  Patient ID: Leah Ferrell, female   DOB: [birth year], 72 y.o.   MRN: [id number]     01/26/2023  Outreach Medication One  Manufacturer Medication One [employer] [facility] Ozempic  Dose of Ozempic 2MG   Type of Sport and exercise psychologist  Date Application Sent to Prescriber 01/26/2023  Date Application Submitted to Manufacturer 01/26/2023  Method Application Sent to Manufacturer Online           01/26/2023  Outreach Medication Two  Manufacturer Medication Two [employer] Nordisk  [facility] [facility] Tresiba  Dose of Tresiba U200 flexpen  Type of Radiographer, therapeutic Assistance  Date Application Sent to Prescriber 01/26/2023  Method Application Sent to Manufacturer Online  Date Application Submitted to Manufacturer 01/26/2023          01/26/2023  Outreach Medication Three  Manufacturer Medication Three [employer] Nordisk  [facility] [facility] Fiasp  Dose of Fiasp flextouch  Type of Radiographer, therapeutic Assistance  Date Application Sent to Prescriber 01/26/2023  Date Application Submitted to Manufacturer 01/26/2023  Method Application Sent to [employer]       Submitted patient portion online. [employer] emailed provider pages to [employer].

## 2023-01-29 ENCOUNTER — Telehealth: Payer: Self-pay | Admitting: Family Medicine

## 2023-01-29 NOTE — Telephone Encounter (Signed)
Called patient back last xray result Cervical spine xray showed some degenerative changes but other wise normal. Lmtcb

## 2023-01-29 NOTE — Telephone Encounter (Signed)
Provider pages completed and submitted 01/29/23.

## 2023-01-29 NOTE — Telephone Encounter (Signed)
Copied from CRM [id number]. Topic: Clinical - Medication Question >> Jan 29, 2023 10:45 AM Dennison Nancy wrote: Reason for CRM: request to speak with Darel Hong or [facility] regarding patient use her last pen afraid will not have any more by weekend ,

## 2023-01-29 NOTE — Telephone Encounter (Signed)
Patient aware and verbalized understanding. °

## 2023-01-29 NOTE — Telephone Encounter (Signed)
Copied from CRM [id number]. Topic: Clinical - Lab/Test Results >> Jan 29, 2023 10:51 AM Dennison Nancy wrote: Reason for CRM: patient requesting her lab result for her x ray

## 2023-02-03 ENCOUNTER — Other Ambulatory Visit: Payer: Self-pay | Admitting: Family

## 2023-02-03 DIAGNOSIS — E1159 Type 2 diabetes mellitus with other circulatory complications: Secondary | ICD-10-CM

## 2023-02-06 ENCOUNTER — Other Ambulatory Visit: Payer: Self-pay | Admitting: Family Medicine

## 2023-02-06 DIAGNOSIS — E1151 Type 2 diabetes mellitus with diabetic peripheral angiopathy without gangrene: Secondary | ICD-10-CM

## 2023-02-06 MED ORDER — HUMALOG KWIKPEN 200 UNIT/ML ~~LOC~~ SOPN: 50.0000 [IU] | PEN_INJECTOR | Freq: Three times a day (TID) | SUBCUTANEOUS | 11 refills | Status: DC

## 2023-02-06 NOTE — Telephone Encounter (Signed)
Copied from CRM [id number]. Topic: Clinical - Prescription Issue >> Feb 06, 2023 10:18 AM Antony Haste wrote: Reason for CRM: PT is requesting to speak with Darel Hong or Ashly in concern with her last pen for insulin and novo. Pt states she has not received her refill and needs it before this evening. Reviewed patient flags for Rx refills. Callback #: [phone]

## 2023-02-06 NOTE — Telephone Encounter (Signed)
Please let patient know: Novolog ENTIRE box of 5 pens placed up front in wendy's fridge for pick up--she refers to it as her meal time We don't have any Humalog I sent in a prescription to [facility] for humalog (preferred on insurance) We will not have any more and Durward Mallard is working on her shipment--ti's just not here :( She will need to pick up Humalog at [facility] unless we call her sooner with her free shipment  Thank you!!

## 2023-02-06 NOTE — Addendum Note (Signed)
Addended by: Vanice Sarah D on: 02/06/2023 12:55 PM   Modules accepted: Orders

## 2023-02-12 ENCOUNTER — Other Ambulatory Visit: Payer: Self-pay | Admitting: Family

## 2023-03-10 ENCOUNTER — Other Ambulatory Visit: Payer: Self-pay | Admitting: Family

## 2023-03-13 NOTE — Progress Notes (Addendum)
 Pharmacy Medication Assistance Program Note    06/09/2023  Patient ID: Leah Ferrell, female   DOB: 1950-08-27, 73 y.o.   MRN: [id number]     01/26/2023  Outreach Medication One  Manufacturer Medication One Novo Nordisk  [facility] [facility] Ozempic   Type of Radiographer, therapeutic Assistance  Date Application Sent to Prescriber 01/26/2023  Date Application Submitted to Manufacturer 01/26/2023  Method Application Sent to Manufacturer Online  Patient Assistance Determination Approved  Approval End Date 02/17/2024         06/09/2023  Patient ID: Leah Ferrell, female  DOB: 08-31-1950, 73 y.o.  MRN:  [id number]     01/26/2023  Outreach Medication Two  Manufacturer Medication Two Novo Nordisk  [employer] [facility] Tresiba   Dose of Tresiba  U200 flexpen  Type of Sport and exercise psychologist  Date Application Sent to Prescriber 01/26/2023  Method Application Sent to Manufacturer Online  Date Application Submitted to Manufacturer 01/26/2023  Patient Assistance Determination Approved        06/09/2023  Patient ID: Leah Ferrell, female  DOB: Jan 19, 1951, 73 y.o.  MRN:  [id number]     06/09/2023  Outreach Medication Four  Manufacturer Medication Four Novo Nordisk  [facility] [facility] Novolog   Type of Radiographer, therapeutic Assistance  Patient Assistance Determination Approved  Approval End Date 02/17/2024

## 2023-03-16 ENCOUNTER — Other Ambulatory Visit: Payer: Self-pay | Admitting: Family

## 2023-03-16 DIAGNOSIS — E785 Hyperlipidemia, unspecified: Secondary | ICD-10-CM

## 2023-03-18 ENCOUNTER — Other Ambulatory Visit: Payer: Self-pay | Admitting: Family

## 2023-03-18 DIAGNOSIS — E785 Hyperlipidemia, unspecified: Secondary | ICD-10-CM

## 2023-03-19 ENCOUNTER — Ambulatory Visit: Payer: Medicare Other | Admitting: Family

## 2023-04-01 ENCOUNTER — Telehealth: Payer: Self-pay | Admitting: Cardiovascular Disease

## 2023-04-01 NOTE — Telephone Encounter (Signed)
Patient identification verified by 2 forms. Marilynn Rail, RN    Called and spoke to patient  Relayed provider message  Patient scheduled for OV 2/18 at 9:00am  Reviewed ED warning signs/precautions  Patient verbalized understanding, no questions at this time

## 2023-04-01 NOTE — Telephone Encounter (Signed)
This requires an office visit with an EKG.  I can see her on Tuesday 2/18 at [facility]

## 2023-04-01 NOTE — Telephone Encounter (Signed)
Patient identification verified by 2 forms. Marilynn Rail, RN    Called and spoke to patient  Patient states:    -she has SOB after activity   -has to sit for 10-15 minutes after activity   -has to use inhaler when SOB occurs   -also develops chest tightness with activity   -chest tightness will persist for 10-15 minutes   -after resting the symptoms resolve   -is concerned about developing a heart attack   -symptoms on going for 3 months   -she is trying to be active on treadmill but having hard time due to chest tightness and SOB   -has appointment with PCP on 04/06/23  -would like recommendations from Dr. Kirke Corin, does not want PA/NP  Patient denies at time of call:   -chest pain/pressure   -SOB/difficulty breathing  Informed patient message sent to Dr. Kirke Corin for input/advisement  Reviewed ED warning signs/precautions  Patient verbalized understanding, no questions at this time

## 2023-04-01 NOTE — Telephone Encounter (Signed)
Patient called and mentioned that she would like to speak with Dr. Kirke Corin or nurse.

## 2023-04-02 ENCOUNTER — Other Ambulatory Visit: Payer: Self-pay | Admitting: Family

## 2023-04-02 DIAGNOSIS — H01001 Unspecified blepharitis right upper eyelid: Secondary | ICD-10-CM | POA: Diagnosis not present

## 2023-04-02 DIAGNOSIS — H01002 Unspecified blepharitis right lower eyelid: Secondary | ICD-10-CM | POA: Diagnosis not present

## 2023-04-02 DIAGNOSIS — E113393 Type 2 diabetes mellitus with moderate nonproliferative diabetic retinopathy without macular edema, bilateral: Secondary | ICD-10-CM | POA: Diagnosis not present

## 2023-04-02 DIAGNOSIS — H43821 Vitreomacular adhesion, right eye: Secondary | ICD-10-CM | POA: Diagnosis not present

## 2023-04-02 DIAGNOSIS — I152 Hypertension secondary to endocrine disorders: Secondary | ICD-10-CM

## 2023-04-04 ENCOUNTER — Other Ambulatory Visit: Payer: Self-pay | Admitting: Family

## 2023-04-04 DIAGNOSIS — E1159 Type 2 diabetes mellitus with other circulatory complications: Secondary | ICD-10-CM

## 2023-04-06 ENCOUNTER — Encounter (HOSPITAL_COMMUNITY): Payer: Self-pay

## 2023-04-06 ENCOUNTER — Inpatient Hospital Stay (HOSPITAL_COMMUNITY)
Admission: EM | Admit: 2023-04-06 | Discharge: 2023-04-10 | DRG: 322 | Disposition: A | Discharge status: Home / Self Care | Payer: Medicare Other | Attending: Internal Medicine | Admitting: Internal Medicine

## 2023-04-06 ENCOUNTER — Ambulatory Visit: Payer: Medicare Other | Admitting: Family

## 2023-04-06 ENCOUNTER — Emergency Department (HOSPITAL_COMMUNITY): Payer: Medicare Other

## 2023-04-06 ENCOUNTER — Other Ambulatory Visit: Payer: Self-pay

## 2023-04-06 ENCOUNTER — Telehealth: Payer: Self-pay

## 2023-04-06 DIAGNOSIS — E1151 Type 2 diabetes mellitus with diabetic peripheral angiopathy without gangrene: Secondary | ICD-10-CM | POA: Diagnosis not present

## 2023-04-06 DIAGNOSIS — E11319 Type 2 diabetes mellitus with unspecified diabetic retinopathy without macular edema: Secondary | ICD-10-CM | POA: Diagnosis not present

## 2023-04-06 DIAGNOSIS — E1159 Type 2 diabetes mellitus with other circulatory complications: Secondary | ICD-10-CM | POA: Diagnosis not present

## 2023-04-06 DIAGNOSIS — I2510 Atherosclerotic heart disease of native coronary artery without angina pectoris: Secondary | ICD-10-CM | POA: Diagnosis present

## 2023-04-06 DIAGNOSIS — Y712 Prosthetic and other implants, materials and accessory cardiovascular devices associated with adverse incidents: Secondary | ICD-10-CM | POA: Diagnosis present

## 2023-04-06 DIAGNOSIS — Z79899 Other long term (current) drug therapy: Secondary | ICD-10-CM | POA: Diagnosis not present

## 2023-04-06 DIAGNOSIS — I152 Hypertension secondary to endocrine disorders: Secondary | ICD-10-CM | POA: Diagnosis not present

## 2023-04-06 DIAGNOSIS — Z833 Family history of diabetes mellitus: Secondary | ICD-10-CM | POA: Diagnosis not present

## 2023-04-06 DIAGNOSIS — I150 Renovascular hypertension: Secondary | ICD-10-CM | POA: Diagnosis present

## 2023-04-06 DIAGNOSIS — E66812 Obesity, class 2: Secondary | ICD-10-CM | POA: Diagnosis not present

## 2023-04-06 DIAGNOSIS — I701 Atherosclerosis of renal artery: Secondary | ICD-10-CM | POA: Diagnosis present

## 2023-04-06 DIAGNOSIS — I10 Essential (primary) hypertension: Secondary | ICD-10-CM | POA: Diagnosis present

## 2023-04-06 DIAGNOSIS — I160 Hypertensive urgency: Secondary | ICD-10-CM | POA: Diagnosis not present

## 2023-04-06 DIAGNOSIS — Z7902 Long term (current) use of antithrombotics/antiplatelets: Secondary | ICD-10-CM | POA: Diagnosis not present

## 2023-04-06 DIAGNOSIS — Z811 Family history of alcohol abuse and dependence: Secondary | ICD-10-CM

## 2023-04-06 DIAGNOSIS — Z6835 Body mass index (BMI) 35.0-35.9, adult: Secondary | ICD-10-CM

## 2023-04-06 DIAGNOSIS — Z8249 Family history of ischemic heart disease and other diseases of the circulatory system: Secondary | ICD-10-CM | POA: Diagnosis not present

## 2023-04-06 DIAGNOSIS — I5032 Chronic diastolic (congestive) heart failure: Secondary | ICD-10-CM | POA: Diagnosis not present

## 2023-04-06 DIAGNOSIS — Z7982 Long term (current) use of aspirin: Secondary | ICD-10-CM | POA: Diagnosis not present

## 2023-04-06 DIAGNOSIS — I252 Old myocardial infarction: Secondary | ICD-10-CM | POA: Diagnosis not present

## 2023-04-06 DIAGNOSIS — Z83438 Family history of other disorder of lipoprotein metabolism and other lipidemia: Secondary | ICD-10-CM

## 2023-04-06 DIAGNOSIS — Z951 Presence of aortocoronary bypass graft: Secondary | ICD-10-CM | POA: Diagnosis not present

## 2023-04-06 DIAGNOSIS — Z87891 Personal history of nicotine dependence: Secondary | ICD-10-CM

## 2023-04-06 DIAGNOSIS — I161 Hypertensive emergency: Secondary | ICD-10-CM | POA: Diagnosis not present

## 2023-04-06 DIAGNOSIS — Z9842 Cataract extraction status, left eye: Secondary | ICD-10-CM

## 2023-04-06 DIAGNOSIS — Z955 Presence of coronary angioplasty implant and graft: Secondary | ICD-10-CM | POA: Diagnosis not present

## 2023-04-06 DIAGNOSIS — Z961 Presence of intraocular lens: Secondary | ICD-10-CM | POA: Diagnosis present

## 2023-04-06 DIAGNOSIS — I110 Hypertensive heart disease with heart failure: Secondary | ICD-10-CM | POA: Diagnosis present

## 2023-04-06 DIAGNOSIS — Z743 Need for continuous supervision: Secondary | ICD-10-CM | POA: Diagnosis not present

## 2023-04-06 DIAGNOSIS — Z794 Long term (current) use of insulin: Secondary | ICD-10-CM | POA: Diagnosis not present

## 2023-04-06 DIAGNOSIS — R6889 Other general symptoms and signs: Secondary | ICD-10-CM | POA: Diagnosis not present

## 2023-04-06 DIAGNOSIS — R7989 Other specified abnormal findings of blood chemistry: Secondary | ICD-10-CM | POA: Diagnosis not present

## 2023-04-06 DIAGNOSIS — E66811 Obesity, class 1: Secondary | ICD-10-CM | POA: Diagnosis present

## 2023-04-06 DIAGNOSIS — R55 Syncope and collapse: Secondary | ICD-10-CM | POA: Diagnosis not present

## 2023-04-06 DIAGNOSIS — E785 Hyperlipidemia, unspecified: Secondary | ICD-10-CM | POA: Diagnosis not present

## 2023-04-06 DIAGNOSIS — I1A0 Resistant hypertension: Secondary | ICD-10-CM | POA: Diagnosis not present

## 2023-04-06 DIAGNOSIS — I493 Ventricular premature depolarization: Secondary | ICD-10-CM | POA: Diagnosis present

## 2023-04-06 DIAGNOSIS — I700 Atherosclerosis of aorta: Secondary | ICD-10-CM | POA: Diagnosis not present

## 2023-04-06 DIAGNOSIS — Z95820 Peripheral vascular angioplasty status with implants and grafts: Secondary | ICD-10-CM | POA: Diagnosis not present

## 2023-04-06 DIAGNOSIS — T82856A Stenosis of peripheral vascular stent, initial encounter: Secondary | ICD-10-CM | POA: Diagnosis not present

## 2023-04-06 DIAGNOSIS — Z7985 Long-term (current) use of injectable non-insulin antidiabetic drugs: Secondary | ICD-10-CM | POA: Diagnosis not present

## 2023-04-06 DIAGNOSIS — Z91048 Other nonmedicinal substance allergy status: Secondary | ICD-10-CM

## 2023-04-06 DIAGNOSIS — I6782 Cerebral ischemia: Secondary | ICD-10-CM | POA: Diagnosis not present

## 2023-04-06 DIAGNOSIS — I214 Non-ST elevation (NSTEMI) myocardial infarction: Secondary | ICD-10-CM | POA: Diagnosis not present

## 2023-04-06 DIAGNOSIS — I2584 Coronary atherosclerosis due to calcified coronary lesion: Secondary | ICD-10-CM | POA: Diagnosis present

## 2023-04-06 LAB — CBC WITH DIFFERENTIAL/PLATELET
Abs Immature Granulocytes: 0.03 10*3/uL (ref 0.00–0.07)
Basophils Absolute: 0 10*3/uL (ref 0.0–0.1)
Basophils Relative: 0 %
Eosinophils Absolute: 0.1 10*3/uL (ref 0.0–0.5)
Eosinophils Relative: 1 %
HCT: 44.6 % (ref 36.0–46.0)
Hemoglobin: 14.6 g/dL (ref 12.0–15.0)
Immature Granulocytes: 0 %
Lymphocytes Relative: 27 %
Lymphs Abs: 2.4 10*3/uL (ref 0.7–4.0)
MCH: 31.1 pg (ref 26.0–34.0)
MCHC: 32.7 g/dL (ref 30.0–36.0)
MCV: 94.9 fL (ref 80.0–100.0)
Monocytes Absolute: 0.5 10*3/uL (ref 0.1–1.0)
Monocytes Relative: 5 %
Neutro Abs: 5.8 10*3/uL (ref 1.7–7.7)
Neutrophils Relative %: 67 %
Platelets: 224 10*3/uL (ref 150–400)
RBC: 4.7 MIL/uL (ref 3.87–5.11)
RDW: 13.7 % (ref 11.5–15.5)
WBC: 8.8 10*3/uL (ref 4.0–10.5)
nRBC: 0 % (ref 0.0–0.2)

## 2023-04-06 LAB — URINALYSIS, ROUTINE W REFLEX MICROSCOPIC
Bacteria, UA: NONE SEEN
Bilirubin Urine: NEGATIVE
Glucose, UA: NEGATIVE mg/dL
Hgb urine dipstick: NEGATIVE
Ketones, ur: NEGATIVE mg/dL
Leukocytes,Ua: NEGATIVE
Nitrite: NEGATIVE
Protein, ur: 100 mg/dL — AB
Specific Gravity, Urine: 1.008 (ref 1.005–1.030)
pH: 7 (ref 5.0–8.0)

## 2023-04-06 LAB — GLUCOSE, CAPILLARY: Glucose-Capillary: 184 mg/dL — ABNORMAL HIGH (ref 70–99)

## 2023-04-06 LAB — COMPREHENSIVE METABOLIC PANEL
ALT: 32 U/L (ref 0–44)
AST: 26 U/L (ref 15–41)
Albumin: 3.7 g/dL (ref 3.5–5.0)
Alkaline Phosphatase: 46 U/L (ref 38–126)
Anion gap: 8 (ref 5–15)
BUN: 10 mg/dL (ref 8–23)
CO2: 31 mmol/L (ref 22–32)
Calcium: 9.4 mg/dL (ref 8.9–10.3)
Chloride: 99 mmol/L (ref 98–111)
Creatinine, Ser: 0.67 mg/dL (ref 0.44–1.00)
GFR, Estimated: >60 mL/min (ref >60)
Glucose, Bld: 141 mg/dL — ABNORMAL HIGH (ref 70–99)
Potassium: 4 mmol/L (ref 3.5–5.1)
Sodium: 138 mmol/L (ref 135–145)
Total Bilirubin: 0.4 mg/dL (ref 0.0–1.2)
Total Protein: 7.6 g/dL (ref 6.5–8.1)

## 2023-04-06 LAB — CBG MONITORING, ED
Glucose-Capillary: 137 mg/dL — ABNORMAL HIGH (ref 70–99)
Glucose-Capillary: 116 mg/dL — ABNORMAL HIGH (ref 70–99)

## 2023-04-06 LAB — TROPONIN I (HIGH SENSITIVITY)
Troponin I (High Sensitivity): 320 ng/L (ref <18)
Troponin I (High Sensitivity): 355 ng/L (ref <18)

## 2023-04-06 LAB — HEPARIN LEVEL (UNFRACTIONATED): Heparin Unfractionated: 0.3 [IU]/mL (ref 0.30–0.70)

## 2023-04-06 LAB — MAGNESIUM: Magnesium: 1.9 mg/dL (ref 1.7–2.4)

## 2023-04-06 MED ORDER — LISINOPRIL 20 MG PO TABS
20.0000 mg | ORAL_TABLET | Freq: Two times a day (BID) | ORAL | Status: DC
  Administered 2023-04-07 – 2023-04-10 (×7): 20 mg via ORAL
  Filled 2023-04-06 (×3): qty 1
  Filled 2023-04-06: qty 2
  Filled 2023-04-06 (×3): qty 1

## 2023-04-06 MED ORDER — CARVEDILOL 3.125 MG PO TABS
6.2500 mg | ORAL_TABLET | Freq: Two times a day (BID) | ORAL | Status: DC
  Administered 2023-04-06: 6.25 mg via ORAL
  Filled 2023-04-06: qty 2

## 2023-04-06 MED ORDER — ROSUVASTATIN CALCIUM 20 MG PO TABS
40.0000 mg | ORAL_TABLET | Freq: Every day | ORAL | Status: DC
  Administered 2023-04-07 – 2023-04-10 (×4): 40 mg via ORAL
  Filled 2023-04-06 (×4): qty 2

## 2023-04-06 MED ORDER — INSULIN GLARGINE-YFGN 100 UNIT/ML ~~LOC~~ SOLN
10.0000 [IU] | Freq: Every day | SUBCUTANEOUS | Status: DC
  Administered 2023-04-06: 10 [IU] via SUBCUTANEOUS
  Filled 2023-04-06 (×2): qty 0.1

## 2023-04-06 MED ORDER — AMLODIPINE BESYLATE 5 MG PO TABS
5.0000 mg | ORAL_TABLET | Freq: Every day | ORAL | Status: DC
  Administered 2023-04-07: 5 mg via ORAL
  Filled 2023-04-06: qty 1

## 2023-04-06 MED ORDER — POLYETHYLENE GLYCOL 3350 17 G PO PACK: 17.0000 g | PACK | Freq: Every day | ORAL | Status: DC | PRN

## 2023-04-06 MED ORDER — ONDANSETRON HCL 4 MG/2ML IJ SOLN: 4.0000 mg | Freq: Four times a day (QID) | INTRAMUSCULAR | Status: DC | PRN

## 2023-04-06 MED ORDER — METOPROLOL TARTRATE 25 MG PO TABS
25.0000 mg | ORAL_TABLET | Freq: Once | ORAL | Status: AC
  Administered 2023-04-06: 25 mg via ORAL
  Filled 2023-04-06: qty 1

## 2023-04-06 MED ORDER — ASPIRIN 81 MG PO TBEC
81.0000 mg | DELAYED_RELEASE_TABLET | Freq: Every day | ORAL | Status: DC
  Administered 2023-04-07 – 2023-04-10 (×3): 81 mg via ORAL
  Filled 2023-04-06 (×3): qty 1

## 2023-04-06 MED ORDER — MONTELUKAST SODIUM 10 MG PO TABS
10.0000 mg | ORAL_TABLET | Freq: Every day | ORAL | Status: DC
  Administered 2023-04-06 – 2023-04-09 (×4): 10 mg via ORAL
  Filled 2023-04-06 (×4): qty 1

## 2023-04-06 MED ORDER — CLOPIDOGREL BISULFATE 75 MG PO TABS
75.0000 mg | ORAL_TABLET | Freq: Every day | ORAL | Status: DC
  Administered 2023-04-07 – 2023-04-10 (×4): 75 mg via ORAL
  Filled 2023-04-06 (×4): qty 1

## 2023-04-06 MED ORDER — HEPARIN (PORCINE) 25000 UT/250ML-% IV SOLN
1200.0000 [IU]/h | INTRAVENOUS | Status: DC
  Administered 2023-04-06: 900 [IU]/h via INTRAVENOUS
  Administered 2023-04-07: 1000 [IU]/h via INTRAVENOUS
  Filled 2023-04-06 (×2): qty 250

## 2023-04-06 MED ORDER — NITROGLYCERIN 2 % TD OINT
1.0000 [in_us] | TOPICAL_OINTMENT | Freq: Once | TRANSDERMAL | Status: AC
  Administered 2023-04-06: 1 [in_us] via TOPICAL
  Filled 2023-04-06: qty 1

## 2023-04-06 MED ORDER — ONDANSETRON HCL 4 MG PO TABS: 4.0000 mg | ORAL_TABLET | Freq: Four times a day (QID) | ORAL | Status: DC | PRN

## 2023-04-06 MED ORDER — NITROGLYCERIN IN D5W 200-5 MCG/ML-% IV SOLN
5.0000 ug/min | INTRAVENOUS | Status: DC
  Administered 2023-04-06: 5 ug/min via INTRAVENOUS
  Administered 2023-04-07 (×2): 90 ug/min via INTRAVENOUS
  Administered 2023-04-08: 125 ug/min via INTRAVENOUS
  Administered 2023-04-08: 110 ug/min via INTRAVENOUS
  Filled 2023-04-06 (×5): qty 250

## 2023-04-06 MED ORDER — ASPIRIN 81 MG PO CHEW
324.0000 mg | CHEWABLE_TABLET | Freq: Once | ORAL | Status: AC
  Administered 2023-04-06: 324 mg via ORAL
  Filled 2023-04-06: qty 4

## 2023-04-06 MED ORDER — ACETAMINOPHEN 650 MG RE SUPP: 650.0000 mg | Freq: Four times a day (QID) | RECTAL | Status: DC | PRN

## 2023-04-06 MED ORDER — HEPARIN BOLUS VIA INFUSION
4000.0000 [IU] | Freq: Once | INTRAVENOUS | Status: AC
  Administered 2023-04-06: 4000 [IU] via INTRAVENOUS

## 2023-04-06 MED ORDER — ACETAMINOPHEN 325 MG PO TABS: 650.0000 mg | ORAL_TABLET | Freq: Four times a day (QID) | ORAL | Status: DC | PRN

## 2023-04-06 MED ORDER — ALBUTEROL SULFATE (2.5 MG/3ML) 0.083% IN NEBU: 3.0000 mL | INHALATION_SOLUTION | RESPIRATORY_TRACT | Status: DC | PRN

## 2023-04-06 MED ORDER — INSULIN ASPART 100 UNIT/ML IJ SOLN
0.0000 [IU] | Freq: Every day | INTRAMUSCULAR | Status: DC
  Administered 2023-04-07 – 2023-04-08 (×2): 3 [IU] via SUBCUTANEOUS
  Administered 2023-04-09: 2 [IU] via SUBCUTANEOUS

## 2023-04-06 MED ORDER — CHLORHEXIDINE GLUCONATE CLOTH 2 % EX PADS
6.0000 | MEDICATED_PAD | Freq: Every day | CUTANEOUS | Status: DC
  Administered 2023-04-06 – 2023-04-08 (×2): 6 via TOPICAL

## 2023-04-06 MED ORDER — INSULIN ASPART 100 UNIT/ML IJ SOLN
0.0000 [IU] | Freq: Three times a day (TID) | INTRAMUSCULAR | Status: DC
Start: 2023-04-07 — End: 2023-04-10
  Administered 2023-04-07: 5 [IU] via SUBCUTANEOUS
  Administered 2023-04-07: 2 [IU] via SUBCUTANEOUS
  Administered 2023-04-07: 8 [IU] via SUBCUTANEOUS
  Administered 2023-04-09: 2 [IU] via SUBCUTANEOUS
  Administered 2023-04-09 – 2023-04-10 (×3): 3 [IU] via SUBCUTANEOUS

## 2023-04-06 NOTE — ED Triage Notes (Signed)
Pt bib ems from home for hypertension. Per ems pt has been double dosing herself with BP meds, last time being last night, in attempt to stabilize BP. Denies CP, SOB, and visual changes. EMS initial BP of 222/68, latest EMS BP of 140/46

## 2023-04-06 NOTE — Assessment & Plan Note (Signed)
Blood pressure elevated to 197/66. With wide pulse pressure.

## 2023-04-06 NOTE — ED Provider Notes (Signed)
[place] EMERGENCY DEPARTMENT AT [facility] Provider Note   CSN: [id number] Arrival date & time: 04/06/23  1125     History  Chief Complaint  Patient presents with   Hypertension    Pt bib ems from home for hypertension. Per ems pt has been double dosing herself with BP meds, last time being last night, in attempt to stabilize BP. Denies CP, SOB, and visual changes. EMS initial BP of 222/68, latest EMS BP of 140/46 Cardiac Hx of and Triple Bypass    Leah Ferrell is a 73 y.o. female.   Hypertension Pertinent negatives include no chest pain, no headaches and no shortness of breath.  Patient presents for hypertension.  Medical history includes HTN, DM, CAD, PVD, CHF.  Home medications include amlodipine, Coreg, lisinopril.  She has been adherent to these medications.  Last night, she noted that her blood pressure was elevated.  They continue to be elevated this morning.  She took an extra dose of blood pressure medicine.  She denies any current symptoms.  She does state that she has had ongoing intermittent chest pain over the past 3 months.  This seems to improve with rest.     Home Medications Prior to Admission medications   Medication Sig Start Date End Date Taking? Authorizing Provider  acetaminophen (TYLENOL) 325 MG tablet Take 2 tablets (650 mg total) by mouth every 6 (six) hours as needed for mild pain (or Fever >/= 101). 12/13/18   Shon Hale, MD  albuterol (VENTOLIN HFA) 108 (90 Base) MCG/ACT inhaler INHALE 1 TO 2 PUFFS BY MOUTH EVERY 6 HOURS AS NEEDED FOR WHEEZING AND FOR SHORTNESS OF BREATH 03/10/23   Jannifer Rodney A, FNP  amLODipine (NORVASC) 5 MG tablet Take 1 tablet by mouth once daily 02/04/23   Jannifer Rodney A, FNP  Ascorbic Acid (VITAMIN C) 1000 MG tablet Take 1,000 mg by mouth 3 (three) times daily.     [provider]  aspirin EC 81 MG tablet Take 81 mg by mouth daily. In the morning    [provider]  BD ULTRA-FINE  PEN NEEDLES 29G X 12.7MM MISC USE PENNEEDLES 3 TIMES  DAILY AS DIRECTED 06/17/18   Junie Spencer, FNP  blood glucose meter kit and supplies Dispense based on patient and insurance preference. Use up to four times daily as directed. (FOR ICD-10 E10.9, E11.9). 12/19/19   Jannifer Rodney A, FNP  Calcium Carbonate-Vit D-Min (CALTRATE 600+D PLUS MINERALS) 600-800 MG-UNIT TABS Take 1 tablet by mouth 3 (three) times daily.     [provider]  carvedilol (COREG) 6.25 MG tablet Take 1 tablet by mouth twice daily 12/23/22   Iran Ouch, MD  Cholecalciferol (VITAMIN D-3) 125 MCG (5000 UT) TABS Take 5,000 Units by mouth daily.    [provider]  clopidogrel (PLAVIX) 75 MG tablet Take 1 tablet by mouth once daily 02/12/23   Jannifer Rodney A, FNP  dapagliflozin propanediol (FARXIGA) 5 MG TABS tablet Take 1 tablet (5 mg total) by mouth daily before breakfast. 04/18/22   Junie Spencer, FNP  fish oil-omega-3 fatty acids 1000 MG capsule Take 1 g by mouth daily.     [provider]  glucose blood (GLUCOSE METER TEST) test strip Use 6-10 times a day, Uses [employer]On 12/19/19   Jannifer Rodney A, FNP  halobetasol (ULTRAVATE) 0.05 % cream Apply 1 application topically daily as needed (For rash). 07/03/20   Gold, Glenice Laine, PA-C  hydroxypropyl methylcellulose (ISOPTO  TEARS) 2.5 % ophthalmic solution Place 1 drop into both eyes 3 (three) times daily as needed (for dry eyes).    [provider]  insulin aspart (FIASP FLEXTOUCH) 100 UNIT/ML FlexTouch Pen Inject 20-50 Units into the skin 3 (three) times daily before meals.    [provider]  insulin degludec (TRESIBA FLEXTOUCH) 100 UNIT/ML FlexTouch Pen Inject 15-30 Units into the skin daily. 09/09/22   Jannifer Rodney A, FNP  insulin lispro (HUMALOG KWIKPEN) 200 UNIT/ML KwikPen Inject 50 Units into the skin 3 (three) times daily before meals. 02/06/23   Junie Spencer, FNP  Insulin Syringe 27G X 1/2" 0.5 ML MISC Use with Fiasp  insulin TID 04/02/20   Jannifer Rodney A, FNP  lisinopril (ZESTRIL) 20 MG tablet Take 1 tablet by mouth twice daily 04/02/23   Jannifer Rodney A, FNP  montelukast (SINGULAIR) 10 MG tablet Take 1 tablet by mouth once daily with breakfast 10/29/22   Jannifer Rodney A, FNP  ondansetron (ZOFRAN ODT) 4 MG disintegrating tablet Take 1 tablet (4 mg total) by mouth every 8 (eight) hours as needed for nausea or vomiting. 08/01/20   Dettinger, Elige Radon, MD  RELION PEN NEEDLES 29G X MISC USE 1 NEEDLE TO INJECT INSULIN SUBCUTANEOUSLY 3 TIMES DAILY Patient taking differently: 1 Device by Subconjunctival route 3 (three) times daily. 01/31/16   Junie Spencer, FNP  rosuvastatin (CRESTOR) 40 MG tablet Take 1 tablet by mouth once daily 03/16/23   Jannifer Rodney A, FNP  Semaglutide, 2 MG/DOSE, (OZEMPIC, 2 MG/DOSE,) 8 MG/3ML SOPN Inject 2 mg into the skin once a week. 09/09/22   Junie Spencer, FNP      Allergies    Tape    Review of Systems   Review of Systems  Respiratory:  Negative for shortness of breath.   Cardiovascular:  Negative for chest pain.  Gastrointestinal:  Negative for nausea.  Neurological:  Negative for weakness, numbness and headaches.  All other systems reviewed and are negative.   Physical Exam Updated Vital Signs BP (!) 191/61   Pulse 75   Temp 98 F (36.7 C) (Oral)   Resp 18   Ht 5\' 2"  (1.575 m)   Wt 90.7 kg   SpO2 100%   BMI 36.57 kg/m  Physical Exam Vitals and nursing note reviewed.  Constitutional:      General: She is not in acute distress.    Appearance: Normal appearance. She is well-developed. She is not ill-appearing, toxic-appearing or diaphoretic.  HENT:     Head: Normocephalic and atraumatic.     Right Ear: External ear normal.     Left Ear: External ear normal.     Nose: Nose normal.     Mouth/Throat:     Mouth: Mucous membranes are moist.  Eyes:     Extraocular Movements: Extraocular movements intact.     Conjunctiva/sclera: Conjunctivae normal.   Cardiovascular:     Rate and Rhythm: Normal rate and regular rhythm.     Heart sounds: No murmur heard. Pulmonary:     Effort: Pulmonary effort is normal. No respiratory distress.     Breath sounds: Normal breath sounds. No wheezing or rales.  Abdominal:     General: There is no distension.     Palpations: Abdomen is soft.  Musculoskeletal:        General: No swelling. Normal range of motion.     Cervical back: Normal range of motion and neck supple.  Skin:    General: Skin  is warm and dry.     Coloration: Skin is not jaundiced or pale.  Neurological:     General: No focal deficit present.     Mental Status: She is alert and oriented to person, place, and time.  Psychiatric:        Mood and Affect: Mood normal.        Behavior: Behavior normal.     ED Results / Procedures / Treatments   Labs (all labs ordered are listed, but only abnormal results are displayed) Labs Reviewed  COMPREHENSIVE METABOLIC PANEL - Abnormal; Notable for the following components:      Result Value   Glucose, Bld 141 (*)    All other components within normal limits  URINALYSIS, ROUTINE W REFLEX MICROSCOPIC - Abnormal; Notable for the following components:   Color, Urine STRAW (*)    Protein, ur 100 (*)    All other components within normal limits  CBG MONITORING, ED - Abnormal; Notable for the following components:   Glucose-Capillary 137 (*)    All other components within normal limits  CBG MONITORING, ED - Abnormal; Notable for the following components:   Glucose-Capillary 116 (*)    All other components within normal limits  TROPONIN I (HIGH SENSITIVITY) - Abnormal; Notable for the following components:   Troponin I (High Sensitivity) 320 (*)    All other components within normal limits  TROPONIN I (HIGH SENSITIVITY) - Abnormal; Notable for the following components:   Troponin I (High Sensitivity) 355 (*)    All other components within normal limits  CBC WITH DIFFERENTIAL/PLATELET   MAGNESIUM    EKG EKG Interpretation Date/Time:  Monday April 06 2023 12:25:18 EST Ventricular Rate:  69 PR Interval:  168 QRS Duration:  92 QT Interval:  420 QTC Calculation: 450 R Axis:   5  Text Interpretation: Normal sinus rhythm ST & T wave abnormality, consider lateral ischemia Confirmed by Gloris Manchester [phone]) on 04/06/2023 12:43:15 PM  Radiology DG Chest Port 1 View Result Date: 04/06/2023 CLINICAL DATA:  Syncope/presyncope.  Hypertension. EXAM: PORTABLE CHEST 1 VIEW COMPARISON:  07/12/2020 FINDINGS: Previous median sternotomy. Heart size remains normal. Aortic atherosclerotic calcification as seen previously. The lungs are clear. No pulmonary edema. No pleural effusion. No visible acute bone finding. IMPRESSION: No active disease. Previous median sternotomy. Aortic atherosclerotic calcification. Electronically Signed   By: Paulina Fusi M.D.   On: 04/06/2023 14:51   CT Head Wo Contrast Result Date: 04/06/2023 CLINICAL DATA:  Syncope/presyncope. Cerebrovascular cause suspected. Hypertension. EXAM: CT HEAD WITHOUT CONTRAST TECHNIQUE: Contiguous axial images were obtained from the base of the skull through the vertex without intravenous contrast. RADIATION DOSE REDUCTION: This exam was performed according to the departmental dose-optimization program which includes automated exposure control, adjustment of the mA and/or kV according to patient size and/or use of iterative reconstruction technique. COMPARISON:  11/22/2018 FINDINGS: Brain: No acute CT finding. Advanced chronic small-vessel ischemic changes of the cerebral hemispheric white matter. No sign of acute infarction, mass lesion, hemorrhage, hydrocephalus or extra-axial collection. Vascular: There is atherosclerotic calcification of the major vessels at the base of the brain. Skull: Negative Sinuses/Orbits: Clear/normal Other: None IMPRESSION: No acute CT finding. Advanced chronic small-vessel ischemic changes of the cerebral  hemispheric white matter. Electronically Signed   By: Paulina Fusi M.D.   On: 04/06/2023 14:51    Procedures Procedures    Medications Ordered in ED Medications  nitroGLYCERIN 50 mg in dextrose 5 % 250 mL (0.2 mg/mL) infusion (has no administration  in time range)  aspirin chewable tablet 324 mg (324 mg Oral Given 04/06/23 1553)  nitroGLYCERIN (NITROGLYN) 2 % ointment 1 inch (1 inch Topical Given 04/06/23 1554)  metoprolol tartrate (LOPRESSOR) tablet 25 mg (25 mg Oral Given 04/06/23 1554)    ED Course/ Medical Decision Making/ A&P                                 Medical Decision Making Amount and/or Complexity of Data Reviewed Labs: ordered. Radiology: ordered.  Risk OTC drugs. Prescription drug management. Decision regarding hospitalization.   This patient presents to the ED for concern of hypertension, this involves an extensive number of treatment options, and is a complaint that carries with it a high risk of complications and morbidity.  The differential diagnosis includes hypertensive crisis, medication nonadherence, need for medication changes,   Co morbidities that complicate the patient evaluation  HTN, DM, CAD, PVD, CHF   Additional history obtained:  Additional history obtained from N/A External records from outside source obtained and reviewed including EMR   Lab Tests:  I Ordered, and personally interpreted labs.  The pertinent results include: Normal kidney function, normal electrolytes, normal hemoglobin, no leukocytosis.  Troponin is elevated with mild increase on repeat.   Imaging Studies ordered:  I ordered imaging studies including chest x-ray, CT head I independently visualized and interpreted imaging which showed no acute findings I agree with the radiologist interpretation   Cardiac Monitoring: / EKG:  The patient was maintained on a cardiac monitor.  I personally viewed and interpreted the cardiac monitored which showed an underlying rhythm  of: Sinus rhythm   Consultations Obtained:  I requested consultation with the radiologist, Dr. Wyline Mood,  and discussed lab and imaging findings as well as pertinent plan - they recommend: Admission for NSTEMI versus hypertensive emergency.  Okay to start heparin.   Problem List / ED Course / Critical interventions / Medication management  Patient presenting for asymptomatic hypertension.  She states that she has been adherent to her home medications of lisinopril, amlodipine, and Coreg.  Blood pressure on arrival was 185/55.  On exam, patient is well-appearing.  She denies any current symptoms.  Breathing is unlabored and lungs are clear to auscultation.  She has no focal neurologic deficits.  Workup was initiated.  EKG shows some subtle ST segment depressions, when compared to tracing from a year ago.  Initial lab work notable for troponin elevation of 320.  At this time, patient remains chest pain-free.  Blood pressure remains elevated.  NTG, metoprolol, and ASA were ordered.  Per chart review, last heart cath was 3 years ago.  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  She underwent subsequent CABG. I spoke with cardiologist on-call, Dr. Wyline Mood, who recommends initiation of heparin and admission to hospitalist.  Cardiology team will see in the morning.  Heparin was ordered.  Will initiate NTG as well for blood pressure management.  Repeat troponin showed slight elevation from prior.  Patient to be admitted for further management. I ordered medication including ASA, heparin, NTG, metoprolol for NSTEMI Reevaluation of the patient after these medicines showed that the patient improved I have reviewed the patients home medicines and have made adjustments as needed   Social Determinants of Health:  Has access to outpatient care  CRITICAL CARE Performed by: Gloris Manchester   Total critical care time: 32 minutes  Critical care time was exclusive of separately billable procedures  and treating other  patients.  Critical care was necessary to treat or prevent imminent or life-threatening deterioration.  Critical care was time spent personally by me on the following activities: development of treatment plan with patient and/or surrogate as well as nursing, discussions with consultants, evaluation of patient's response to treatment, examination of patient, obtaining history from patient or surrogate, ordering and performing treatments and interventions, ordering and review of laboratory studies, ordering and review of radiographic studies, pulse oximetry and re-evaluation of patient's condition.         Final Clinical Impression(s) / ED Diagnoses Final diagnoses:  NSTEMI (non-ST elevated myocardial infarction) (HCC)  Hypertension, unspecified type    Rx / DC Orders ED Discharge Orders     None         Gloris Manchester, MD 04/06/23 1655

## 2023-04-06 NOTE — Assessment & Plan Note (Addendum)
History of CABG x 3.  Denies chest pain, no difficulty breathing.  Elevated troponin in the setting of Hypertensive emergency. Troponin 320 >> 355.  - EDP talked to cardiologist Dr. Wyline Mood, patient started on heparin drip, will see in consult in a.m. - Trend Troponin  -Echocardiogram -Nitro drip started -Resume Crestor, aspirin, plavix

## 2023-04-06 NOTE — Progress Notes (Signed)
PHARMACY - ANTICOAGULATION CONSULT NOTE  Pharmacy Consult for heparin Indication: chest pain/ACS  Allergies  Allergen Reactions   Tape Rash    Patient Measurements: Height: 5\' 2"  (157.5 cm) Weight: 90.7 kg (199 lb 15.3 oz) IBW/kg (Calculated) : 50.1 Heparin Dosing Weight: 71 kg  Vital Signs: Temp: 98 F (36.7 C) (02/17 1140) Temp Source: Oral (02/17 1140) BP: 191/61 (02/17 1554) Pulse Rate: 75 (02/17 1533)  Labs: Recent Labs    04/06/23 1357 04/06/23 1553  HGB 14.6  --   HCT 44.6  --   PLT 224  --   CREATININE 0.67  --   TROPONINIHS 320* 355*    Estimated Creatinine Clearance: 67.5 mL/min (by C-G formula based on SCr of 0.67 mg/dL).   Medical History: Past Medical History:  Diagnosis Date   CAD (coronary artery disease)    a. 04/2012 NSTEMI: s/p DES to LAD.   Chronic diastolic CHF (congestive heart failure) (HCC)    Diabetes mellitus without complication (HCC)    Dysrhythmia    Environmental and seasonal allergies    Hyperlipidemia    Hypertension    Leukocytosis    Morbid obesity (HCC)    NSTEMI (non-ST elevated myocardial infarction) (HCC) 04/27/2012   PONV (postoperative nausea and vomiting)    PVD (peripheral vascular disease) (HCC)    a. 04/2012 ABI: R 0.49, L 0.39. Angiography 06/14: Significant ostial left common iliac artery stenosis extending into the distal aorta, severe left common femoral artery stenosis, bilateral SFA occlusion with heavy calcifications. Functionally, one-vessel runoff bilaterally below the knee   Renal artery stenosis (HCC)    s/p angioplasty/stenting bilateral renal arteries 03/16/19   Shortness of breath     Medications:  (Not in a hospital admission)   Assessment: Pharmacy consulted to dose heparin in patient with chest pain/ACS.  Patient is not on anticoagulation prior to admission.  CBC WNL Trop 355  Goal of Therapy:  Heparin level 0.3-0.7 units/ml Monitor platelets by anticoagulation protocol: Yes   Plan:  Give  4000 units bolus x 1 Start heparin infusion at 900 units/hr Check anti-Xa level in 6-8 hours and daily while on heparin Continue to monitor H&H and platelets  Judeth Cornfield, PharmD Clinical Pharmacist 04/06/2023 5:06 PM

## 2023-04-06 NOTE — Assessment & Plan Note (Signed)
A1c 10/24-  6.2.  On Tresiba and Humalog. - SSI- M -Resume Tresiba at reduced dose 10 units nightly (home dose 20 units) -Hold home NovoLog 30 units 3 times daily, and Ozempic.

## 2023-04-06 NOTE — Assessment & Plan Note (Signed)
Stable and compensated.  Not on diuretics.  Last echo, TEE-EF of 60 to 65%.

## 2023-04-06 NOTE — Assessment & Plan Note (Addendum)
Blood pressure elevated to 197/66.  With wide pulse pressure.  Reports compliance with antihypertensives. -Nitro drip started in ED, continue -Resume Norvasc, carvedilol, lisinopril -Wean off nitro drip as able

## 2023-04-06 NOTE — Progress Notes (Addendum)
PHARMACY - ANTICOAGULATION CONSULT NOTE  Pharmacy Consult for heparin Indication: chest pain/ACS  Allergies  Allergen Reactions   Tape Rash    Patient Measurements: Height: 5' 2.5" (158.8 cm) Weight: 90.2 kg (198 lb 13.7 oz) IBW/kg (Calculated) : 51.25 Heparin Dosing Weight: 71 kg  Vital Signs: Temp: 98.1 F (36.7 C) (02/17 2300) Temp Source: Oral (02/17 2300) BP: 154/63 (02/17 2324) Pulse Rate: 68 (02/17 2324)  Labs: Recent Labs    04/06/23 1357 04/06/23 1553 04/06/23 2311  HGB 14.6  --   --   HCT 44.6  --   --   PLT 224  --   --   HEPARINUNFRC  --   --  0.30  CREATININE 0.67  --   --   TROPONINIHS 320* 355*  --     Estimated Creatinine Clearance: 68.1 mL/min (by C-G formula based on SCr of 0.67 mg/dL).  Assessment: Pharmacy consulted to dose heparin in patient with chest pain/ACS.  Patient is not on anticoagulation prior to admission. CBC WNL; Trop 355  PM: heparin level returned at 0.3 on 900 units/hr (therapeutic; drawn ~5h post bolus/start). No signs/symptoms of bleeding or issues with the heparin infusion running continuously reported  Goal of Therapy:  Heparin level 0.3-0.7 units/ml Monitor platelets by anticoagulation protocol: Yes   Plan:  Increase heparin infusion to 1000 units/hr to keep within goal range Check confirmatory anti-Xa level in 6-8 hours and daily while on heparin Continue to monitor H&H and platelets  Arabella Merles, PharmD. Clinical Pharmacist 04/06/2023 11:58 PM

## 2023-04-06 NOTE — Telephone Encounter (Signed)
Ok, do not send a no show letter.

## 2023-04-06 NOTE — Telephone Encounter (Signed)
Copied from CRM [id number]. Topic: General - Other >> Apr 06, 2023  2:09 PM Prudencio Pair wrote: Reason for CRM: Patient called to let Dr. Lendon Colonel, nurse, Morrie Sheldon, know that she is currently hospitalized at [facility] & states this is why she missed her appt today with Dr. Lendon Colonel. States she wanted to let you all know.

## 2023-04-06 NOTE — H&P (Signed)
History and Physical    Leah Ferrell BJY:[id number] DOB: 1950/03/28 DOA: 04/06/2023  PCP: Junie Spencer, FNP   Patient coming from: Home  I have personally briefly reviewed patient's old medical records in [facility]  Chief Complaint: Elevated Blood pressure  HPI: Leah Ferrell is a 73 y.o. female with medical history significant for CABG, peripheral artery disease, diabetes mellitus, hypertension, diastolic CHF. Patient presented to the ED with complaints of high blood pressure.  On EMS arrival, blood pressure was 222/68.  Patient reported she took an extra dose of her amlodipine because her blood pressure kept increasing despite taking her home medications.  She denies headache, no visual changes.  No difficulty breathing.  No chest pain.  ED Course: Blood pressure elevated to 197/66.  Temperature 98.  Heart rate mostly 60s.  Respiratory rate 14-19.  O2 sats greater than 92% on room air. EKG shows some subtle ST depressions, so troponin was obtained which was elevated at 320 > 355.  Unremarkable BMP CBC. Head CT negative for acute abnormality, advanced chronic small vessel ischemic changes..  Chest x-ray also unremarkable. EDP to Dr. Wyline Mood, commended starting heparin drip will see in consult in AM.  Nitroglycerin also started for elevated blood pressure.  Review of Systems: As per HPI all other systems reviewed and negative.  Past Medical History:  Diagnosis Date   CAD (coronary artery disease)    a. 04/2012 NSTEMI: s/p DES to LAD.   Chronic diastolic CHF (congestive heart failure) (HCC)    Diabetes mellitus without complication (HCC)    Dysrhythmia    Environmental and seasonal allergies    Hyperlipidemia    Hypertension    Leukocytosis    Morbid obesity (HCC)    NSTEMI (non-ST elevated myocardial infarction) (HCC) 04/27/2012   PONV (postoperative nausea and vomiting)    PVD (peripheral vascular disease) (HCC)    a. 04/2012 ABI: R 0.49, L 0.39. Angiography  06/14: Significant ostial left common iliac artery stenosis extending into the distal aorta, severe left common femoral artery stenosis, bilateral SFA occlusion with heavy calcifications. Functionally, one-vessel runoff bilaterally below the knee   Renal artery stenosis (HCC)    s/p angioplasty/stenting bilateral renal arteries 03/16/19   Shortness of breath     Past Surgical History:  Procedure Laterality Date   ABDOMINAL AORTAGRAM N/A 07/21/2012   Procedure: ABDOMINAL Ronny Flurry;  Surgeon: Iran Ouch, MD;  Location: [facility] CATH LAB;  Service: Cardiovascular;  Laterality: N/A;   ABDOMINAL AORTOGRAM W/LOWER EXTREMITY N/A 05/09/2020   Procedure: ABDOMINAL AORTOGRAM W/LOWER EXTREMITY;  Surgeon: Iran Ouch, MD;  Location: [facility] INVASIVE CV LAB;  Service: Cardiovascular;  Laterality: N/A;   CARDIAC CATHETERIZATION     stents X 2   CATARACT EXTRACTION W/PHACO Left 09/08/2022   Procedure: CATARACT EXTRACTION PHACO AND INTRAOCULAR LENS PLACEMENT (IOC) with placement of Corticosteroid;  Surgeon: Fabio Pierce, MD;  Location: [facility] ORS;  Service: Ophthalmology;  Laterality: Left;  CDE 8.48   CORONARY ANGIOGRAM  04/27/2012   Procedure: CORONARY ANGIOGRAM;  Surgeon: Vesta Mixer, MD;  Location: [facility] CATH LAB;  Service: Cardiovascular;;   CORONARY ARTERY BYPASS GRAFT N/A 06/25/2020   Procedure: CORONARY ARTERY BYPASS GRAFTING (CABG)X3. LEFT INTERNAL MAMMARY ARTERY. RIGHT ENDOSCOPIC SAPHENOUS VEIN HARVESTING.;  Surgeon: Linden Dolin, MD;  Location: [facility] OR;  Service: Open Heart Surgery;  Laterality: N/A;   ENDARTERECTOMY FEMORAL Left 11/02/2013   Procedure: RESECTION LEFT COMMON FEMORAL ARTERY AND INSERTION OF INTERPOSITION 7mm HEMASHIELD GRAFT FROM LEFT  EXTERNAL  ILIAC ARTERY TO LEFT PROFUNDA  FEMORIS ARTERY;  Surgeon: Pryor Ochoa, MD;  Location: [facility] OR;  Service: Vascular;  Laterality: Left;   EYE SURGERY Right 2017   FRACTURE SURGERY Right Jul 07, 2014   Right Foot-  [place] Home  by Heat duct    ILIAC ARTERY STENT Left 08/31/2013   LAD stent     LEFT HEART CATH AND CORONARY ANGIOGRAPHY N/A 05/09/2020   Procedure: LEFT HEART CATH AND CORONARY ANGIOGRAPHY;  Surgeon: Iran Ouch, MD;  Location: [facility] INVAS[facility] CV LAB;  Service: Cardiovascular;  Laterality: N/A;   LEFT HEART CATHETERIZATION WITH CORONARY ANGIOGRAM N/A 04/23/2012   Procedure: LEFT HEART CATHETERIZATION WITH CORONARY ANGIOGRAM;  Surgeon: Peter M Swaziland, MD;  Location: [facility] CATH LAB;  Service: Cardiovascular;  Laterality: N/A;   LOWER EXTREMITY ANGIOGRAM Left 08/31/2013   Procedure: LOWER EXTREMITY ANGIOGRAM;  Surgeon: Iran Ouch, MD;  Location: [facility] CATH LAB;  Service: Cardiovascular;  Laterality: Left;   PERCUTANEOUS STENT INTERVENTION Left 08/31/2013   Procedure: PERCUTANEOUS STENT INTERVENTION;  Surgeon: Iran Ouch, MD;  Location: [facility] CATH [facility];  Service: Cardiovascular;  Laterality: Left;  Common Iliac artery   PERIPHERAL VASCULAR CATHETERIZATION N/A 03/19/2016   Procedure: Abdominal Aortogram w/Lower Extremity;  Surgeon: Iran Ouch, MD;  Location: [facility] CV LAB;  Service: Cardiovascular;  Laterality: N/A;   PERIPHERAL VASCULAR CATHETERIZATION  03/19/2016   Procedure: Peripheral Vascular Balloon Angioplasty-CFA Left;  Surgeon: Iran Ouch, MD;  Location: [facility] CV LAB;  Service: Cardiovascular;;   PERIPHERAL VASCULAR INTERVENTION Bilateral 03/16/2019   Procedure: PERIPHERAL VASCULAR INTERVENTION;  Surgeon: Iran Ouch, MD;  Location: [facility] CV [facility];  Service: Cardiovascular;  Laterality: Bilateral;  RENAL   PERIPHERAL VASCULAR INTERVENTION Bilateral 05/09/2020   Procedure: PERIPHERAL VASCULAR INTERVENTION;  Surgeon: Iran Ouch, MD;  Location: [facility] INVAS[facility] CV LAB;  Service: Cardiovascular;  Laterality: Bilateral;  Iliac   RENAL ANGIOGRAPHY  03/16/2019   RENAL ANGIOGRAPHY Bilateral 03/16/2019   Procedure: RENAL ANGIOGRAPHY;  Surgeon: Iran Ouch, MD;  Location: [facility] INVAS[facility] CV LAB;   Service: Cardiovascular;  Laterality: Bilateral;   TEE WITHOUT CARDIOVERSION N/A 06/25/2020   Procedure: TRANSESOPHAGEAL ECHOCARDIOGRAM (TEE);  Surgeon: Linden Dolin, MD;  Location: [facility] OR;  Service: Open Heart Surgery;  Laterality: N/A;   TUMOR REMOVAL     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 Her smoking use included cigarettes. She started smoking about 58 years ago. She has a 36.8 pack-year smoking history. She has never used smokeless tobacco. She reports that she does not drink alcohol and does not use drugs.  Allergies  Allergen Reactions   Tape Rash    Family History  Problem Relation Age of Onset   Diabetes Mother    Hyperlipidemia Mother    Hypertension Mother    Peripheral vascular disease Mother    Other Sister        drowned   Other Sister        drowned   Diabetes Daughter        borderline   Hypertension Daughter    Diabetes Maternal Grandmother    Heart attack Maternal Grandfather    Heart disease Brother    Diabetes Brother    Alcohol abuse Brother    Diabetes Son    Hypertension Son    Hyperlipidemia Son    Breast cancer Neg Hx  Prior to Admission medications   Medication Sig Start Date End Date Taking? Authorizing Provider  lisinopril (ZESTRIL) 20 MG tablet Take 1 tablet by mouth twice daily 04/02/23  Yes Hawks, Christy A, FNP  acetaminophen (TYLENOL) 325 MG tablet Take 2 tablets (650 mg total) by mouth every 6 (six) hours as needed for mild pain (or Fever >/= 101). 12/13/18   Shon Hale, MD  albuterol (VENTOLIN HFA) 108 (90 Base) MCG/ACT inhaler INHALE 1 TO 2 PUFFS BY MOUTH EVERY 6 HOURS AS NEEDED FOR WHEEZING AND FOR SHORTNESS OF BREATH 03/10/23   Jannifer Rodney A, FNP  amLODipine (NORVASC) 5 MG tablet Take 1 tablet by mouth once daily 02/04/23   Jannifer Rodney A, FNP  Ascorbic Acid (VITAMIN C) 1000 MG tablet Take 1,000 mg by mouth 3 (three) times daily.     [provider]  aspirin EC 81 MG  tablet Take 81 mg by mouth daily. In the morning    [provider]  BD ULTRA-FINE PEN NEEDLES 29G X 12.7MM MISC USE PENNEEDLES 3 TIMES  DAILY AS DIRECTED 06/17/18   Junie Spencer, FNP  blood glucose meter kit and supplies Dispense based on patient and insurance preference. Use up to four times daily as directed. (FOR ICD-10 E10.9, E11.9). 12/19/19   Jannifer Rodney A, FNP  Calcium Carbonate-Vit D-Min (CALTRATE 600+D PLUS MINERALS) 600-800 MG-UNIT TABS Take 1 tablet by mouth 3 (three) times daily.     [provider]  carvedilol (COREG) 6.25 MG tablet Take 1 tablet by mouth twice daily 12/23/22   Iran Ouch, MD  Cholecalciferol (VITAMIN D-3) 125 MCG (5000 UT) TABS Take 5,000 Units by mouth daily.    [provider]  clopidogrel (PLAVIX) 75 MG tablet Take 1 tablet by mouth once daily 02/12/23   Jannifer Rodney A, FNP  dapagliflozin propanediol (FARXIGA) 5 MG TABS tablet Take 1 tablet (5 mg total) by mouth daily before breakfast. 04/18/22   Junie Spencer, FNP  fish oil-omega-3 fatty acids 1000 MG capsule Take 1 g by mouth daily.     [provider]  glucose blood (GLUCOSE METER TEST) test strip Use 6-10 times a day, Uses [employer] 12/19/19   Jannifer Rodney A, FNP  halobetasol (ULTRAVATE) 0.05 % cream Apply 1 application topically daily as needed (For rash). 07/03/20   Gold, Glenice Laine, PA-C  hydroxypropyl methylcellulose (ISOPTO TEARS) 2.5 % ophthalmic solution Place 1 drop into both eyes 3 (three) times daily as needed (for dry eyes).    [provider]  insulin aspart (FIASP FLEXTOUCH) 100 UNIT/ML FlexTouch Pen Inject 20-50 Units into the skin 3 (three) times daily before meals.    [provider]  insulin degludec (TRESIBA FLEXTOUCH) 100 UNIT/ML FlexTouch Pen Inject 15-30 Units into the skin daily. 09/09/22   Jannifer Rodney A, FNP  insulin lispro (HUMALOG KWIKPEN) 200 UNIT/ML KwikPen Inject 50 Units into the skin 3 (three) times daily before meals.  02/06/23   Junie Spencer, FNP  Insulin Syringe 27G X 1/2" 0.5 ML MISC Use with Fiasp insulin TID 04/02/20   Jannifer Rodney A, FNP  montelukast (SINGULAIR) 10 MG tablet Take 1 tablet by mouth once daily with breakfast 10/29/22   Jannifer Rodney A, FNP  ondansetron (ZOFRAN ODT) 4 MG disintegrating tablet Take 1 tablet (4 mg total) by mouth every 8 (eight) hours as needed for nausea or vomiting. 08/01/20   Dettinger, Elige Radon, MD  RELION PEN NEEDLES 29G X MISC USE 1 NEEDLE  TO INJECT INSULIN SUBCUTANEOUSLY 3 TIMES DAILY Patient taking differently: 1 Device by Subconjunctival route 3 (three) times daily. 01/31/16   Junie Spencer, FNP  rosuvastatin (CRESTOR) 40 MG tablet Take 1 tablet by mouth once daily 03/16/23   Jannifer Rodney A, FNP  Semaglutide, 2 MG/DOSE, (OZEMPIC, 2 MG/DOSE,) 8 MG/3ML SOPN Inject 2 mg into the skin once a week. 09/09/22   Junie Spencer, FNP    Physical Exam: Vitals:   04/06/23 2110 04/06/23 2115 04/06/23 2142 04/06/23 2150  BP: (!) 145/59 (!) 155/58 (!) 138/49 (!) 142/55  Pulse: 69 61 65 67  Resp: (!) 21 19 19 19   Temp:      TempSrc:      SpO2: 93% 92% 96% 93%  Weight:      Height:        Constitutional: NAD, calm, comfortable Vitals:   04/06/23 2110 04/06/23 2115 04/06/23 2142 04/06/23 2150  BP: (!) 145/59 (!) 155/58 (!) 138/49 (!) 142/55  Pulse: 69 61 65 67  Resp: (!) 21 19 19 19   Temp:      TempSrc:      SpO2: 93% 92% 96% 93%  Weight:      Height:       Eyes: PERRL, lids and conjunctivae normal ENMT: Mucous membranes are moist.   Neck: normal, supple, no masses, no thyromegaly Respiratory: clear to auscultation bilaterally, no wheezing, no crackles. Normal respiratory effort. No accessory muscle use.  Cardiovascular: Regular rate and rhythm, no murmurs / rubs / gallops. No extremity edema. 2+ pedal pulses. No carotid bruits.  Abdomen: obese, no tenderness, no masses palpated. No hepatosplenomegaly. Bowel sounds positive.  Musculoskeletal: no  clubbing / cyanosis. No joint deformity upper and lower extremities.  Skin: no rashes, lesions, ulcers. No induration Neurologic:  No facial symmetry, moving extremities spontaneously, speech fluent.  Psychiatric: Normal judgment and insight. Alert and oriented x 3. Normal mood.   Labs on Admission: I have personally reviewed following labs and imaging studies  CBC: Recent Labs  Lab 04/06/23 1357  WBC 8.8  NEUTROABS 5.8  HGB 14.6  HCT 44.6  MCV 94.9  PLT 224   Basic Metabolic Panel: Recent Labs  Lab 04/06/23 1357  NA 138  K 4.0  CL 99  CO2 31  GLUCOSE 141*  BUN 10  CREATININE 0.67  CALCIUM 9.4  MG 1.9   GFR: Estimated Creatinine Clearance: 67.5 mL/min (by C-G formula based on SCr of 0.67 mg/dL). Liver Function Tests: Recent Labs  Lab 04/06/23 1357  AST 26  ALT 32  ALKPHOS 46  BILITOT 0.4  PROT 7.6  ALBUMIN 3.7   CBG: Recent Labs  Lab 04/06/23 1308 04/06/23 1532  GLUCAP 137* 116*    Urine analysis:    Component Value Date/Time   COLORURINE STRAW (A) 04/06/2023 1254   APPEARANCEUR CLEAR 04/06/2023 1254   APPEARANCEUR Clear 04/24/2016 1156   LABSPEC 1.008 04/06/2023 1254   PHURINE 7.0 04/06/2023 1254   GLUCOSEU NEGATIVE 04/06/2023 1254   HGBUR NEGATIVE 04/06/2023 1254   BILIRUBINUR NEGATIVE 04/06/2023 1254   BILIRUBINUR Negative 04/24/2016 1156   KETONESUR NEGATIVE 04/06/2023 1254   PROTEINUR 100 (A) 04/06/2023 1254   UROBILINOGEN 0.2 10/27/2013 1341   NITRITE NEGATIVE 04/06/2023 1254   LEUKOCYTESUR NEGATIVE 04/06/2023 1254    Radiological Exams on Admission: DG Chest Port 1 View Result Date: 04/06/2023 CLINICAL DATA:  Syncope/presyncope.  Hypertension. EXAM: PORTABLE CHEST 1 VIEW COMPARISON:  07/12/2020 FINDINGS: Previous median sternotomy. Heart size remains normal.  Aortic atherosclerotic calcification as seen previously. The lungs are clear. No pulmonary edema. No pleural effusion. No visible acute bone finding. IMPRESSION: No active disease.  Previous median sternotomy. Aortic atherosclerotic calcification. Electronically Signed   By: Paulina Fusi M.D.   On: 04/06/2023 14:51   CT Head Wo Contrast Result Date: 04/06/2023 CLINICAL DATA:  Syncope/presyncope. Cerebrovascular cause suspected. Hypertension. EXAM: CT HEAD WITHOUT CONTRAST TECHNIQUE: Contiguous axial images were obtained from the base of the skull through the vertex without intravenous contrast. RADIATION DOSE REDUCTION: This exam was performed according to the departmental dose-optimization program which includes automated exposure control, adjustment of the mA and/or kV according to patient size and/or use of iterative reconstruction technique. COMPARISON:  11/22/2018 FINDINGS: Brain: No acute CT finding. Advanced chronic small-vessel ischemic changes of the cerebral hemispheric white matter. No sign of acute infarction, mass lesion, hemorrhage, hydrocephalus or extra-axial collection. Vascular: There is atherosclerotic calcification of the major vessels at the base of the brain. Skull: Negative Sinuses/Orbits: Clear/normal Other: None IMPRESSION: No acute CT finding. Advanced chronic small-vessel ischemic changes of the cerebral hemispheric white matter. Electronically Signed   By: Paulina Fusi M.D.   On: 04/06/2023 14:51    EKG: Independently reviewed.  Sinus rhythm, rate 69, QTc 450. No significant change from prior.  Assessment/Plan Principal Problem:   Hypertensive emergency Active Problems:   Elevated troponin   Hypertension associated with diabetes (HCC)   Diabetes mellitus with peripheral vascular disease (HCC)   Chronic diastolic heart failure (HCC)   Morbid obesity (HCC)   S/P CABG x 3    Assessment and Plan: * Hypertensive urgency Blood pressure elevated to 197/66.  With wide pulse pressure.  Reports compliance with antihypertensives. -Nitro drip started in ED, continue -Resume Norvasc, carvedilol, lisinopril -Wean off nitro drip as able  Elevated  troponin History of CABG x 3.  Denies chest pain, no difficulty breathing.  Elevated troponin in the setting of Hypertensive emergency. Troponin 320 >> 355.  - EDP talked to cardiologist Dr. Wyline Mood, patient started on heparin drip, will see in consult in a.m. - Trend Troponin  -Echocardiogram -Nitro drip started -Resume Crestor, aspirin, plavix  Chronic diastolic heart failure (HCC) Stable and compensated.  Not on diuretics.  Last echo, TEE-EF of 60 to 65%.  Diabetes mellitus with peripheral vascular disease (HCC) A1c 10/24-  6.2.  On Tresiba and Humalog. - SSI- M -Resume Tresiba at reduced dose 10 units nightly (home dose 20 units) -Hold home NovoLog 30 units 3 times daily, and Ozempic.   DVT prophylaxis: Heparin drip Code Status: Full code Family Communication: None at bedside Disposition Plan: ~ 2 days Consults called:  Cardiology Admission status: Inpt Stepdown I certify that at the point of admission it is my clinical judgment that the patient will require inpatient hospital care spanning beyond 2 midnights from the point of admission due to high intensity of service, high risk for further deterioration and high frequency of surveillance required.  CRITICAL CARE Performed by: Onnie Boer   Total critical care time: 70  minutes  Critical care time was exclusive of separately billable procedures and treating other patients.  Critical care was necessary to treat or prevent imminent or life-threatening deterioration.  Critical care was time spent personally by me on the following activities: development of treatment plan with patient and/or surrogate as well as nursing, discussions with consultants, evaluation of patient's response to treatment, examination of patient, obtaining history from patient or surrogate, ordering and performing treatments and interventions,  ordering and review of laboratory studies, ordering and review of radiographic studies, pulse oximetry and  re-evaluation of patient's condition.   Author: Onnie Boer, MD 04/06/2023 11:09 PM  For on call review [email or web address].

## 2023-04-07 ENCOUNTER — Ambulatory Visit: Payer: Medicare Other | Admitting: Cardiovascular Disease

## 2023-04-07 DIAGNOSIS — E66812 Obesity, class 2: Secondary | ICD-10-CM

## 2023-04-07 DIAGNOSIS — I161 Hypertensive emergency: Secondary | ICD-10-CM

## 2023-04-07 DIAGNOSIS — I152 Hypertension secondary to endocrine disorders: Secondary | ICD-10-CM

## 2023-04-07 DIAGNOSIS — I160 Hypertensive urgency: Secondary | ICD-10-CM | POA: Diagnosis not present

## 2023-04-07 DIAGNOSIS — E1159 Type 2 diabetes mellitus with other circulatory complications: Secondary | ICD-10-CM

## 2023-04-07 DIAGNOSIS — I214 Non-ST elevation (NSTEMI) myocardial infarction: Secondary | ICD-10-CM | POA: Diagnosis not present

## 2023-04-07 DIAGNOSIS — R7989 Other specified abnormal findings of blood chemistry: Secondary | ICD-10-CM | POA: Diagnosis not present

## 2023-04-07 DIAGNOSIS — Z951 Presence of aortocoronary bypass graft: Secondary | ICD-10-CM | POA: Diagnosis not present

## 2023-04-07 LAB — GLUCOSE, CAPILLARY
Glucose-Capillary: 145 mg/dL — ABNORMAL HIGH (ref 70–99)
Glucose-Capillary: 261 mg/dL — ABNORMAL HIGH (ref 70–99)
Glucose-Capillary: 208 mg/dL — ABNORMAL HIGH (ref 70–99)
Glucose-Capillary: 278 mg/dL — ABNORMAL HIGH (ref 70–99)

## 2023-04-07 LAB — BASIC METABOLIC PANEL
Anion gap: 12 (ref 5–15)
BUN: 15 mg/dL (ref 8–23)
CO2: 26 mmol/L (ref 22–32)
Calcium: 9.2 mg/dL (ref 8.9–10.3)
Chloride: 98 mmol/L (ref 98–111)
Creatinine, Ser: 0.62 mg/dL (ref 0.44–1.00)
GFR, Estimated: >60 mL/min (ref >60)
Glucose, Bld: 155 mg/dL — ABNORMAL HIGH (ref 70–99)
Potassium: 3.4 mmol/L — ABNORMAL LOW (ref 3.5–5.1)
Sodium: 136 mmol/L (ref 135–145)

## 2023-04-07 LAB — CBC
HCT: 39.9 % (ref 36.0–46.0)
Hemoglobin: 13.5 g/dL (ref 12.0–15.0)
MCH: 31.5 pg (ref 26.0–34.0)
MCHC: 33.8 g/dL (ref 30.0–36.0)
MCV: 93.2 fL (ref 80.0–100.0)
Platelets: 213 10*3/uL (ref 150–400)
RBC: 4.28 MIL/uL (ref 3.87–5.11)
RDW: 13.9 % (ref 11.5–15.5)
WBC: 9.3 10*3/uL (ref 4.0–10.5)
nRBC: 0 % (ref 0.0–0.2)

## 2023-04-07 LAB — MRSA NEXT GEN BY PCR, NASAL: MRSA by PCR Next Gen: NOT DETECTED

## 2023-04-07 LAB — TROPONIN I (HIGH SENSITIVITY)
Troponin I (High Sensitivity): 395 ng/L (ref <18)
Troponin I (High Sensitivity): 258 ng/L (ref <18)

## 2023-04-07 LAB — HEPARIN LEVEL (UNFRACTIONATED): Heparin Unfractionated: 0.37 [IU]/mL (ref 0.30–0.70)

## 2023-04-07 MED ORDER — INSULIN GLARGINE-YFGN 100 UNIT/ML ~~LOC~~ SOLN
15.0000 [IU] | Freq: Every day | SUBCUTANEOUS | Status: DC
  Administered 2023-04-07 – 2023-04-09 (×3): 15 [IU] via SUBCUTANEOUS
  Filled 2023-04-07 (×4): qty 0.15

## 2023-04-07 MED ORDER — AMLODIPINE BESYLATE 5 MG PO TABS
5.0000 mg | ORAL_TABLET | Freq: Once | ORAL | Status: AC
  Administered 2023-04-07: 5 mg via ORAL
  Filled 2023-04-07: qty 1

## 2023-04-07 MED ORDER — CARVEDILOL 12.5 MG PO TABS
12.5000 mg | ORAL_TABLET | Freq: Two times a day (BID) | ORAL | Status: DC
  Administered 2023-04-07 – 2023-04-08 (×3): 12.5 mg via ORAL
  Filled 2023-04-07 (×3): qty 1

## 2023-04-07 MED ORDER — PANTOPRAZOLE SODIUM 40 MG PO TBEC
40.0000 mg | DELAYED_RELEASE_TABLET | Freq: Every day | ORAL | Status: DC
  Administered 2023-04-08 – 2023-04-10 (×3): 40 mg via ORAL
  Filled 2023-04-07 (×3): qty 1

## 2023-04-07 MED ORDER — POTASSIUM CHLORIDE CRYS ER 20 MEQ PO TBCR
20.0000 meq | EXTENDED_RELEASE_TABLET | Freq: Once | ORAL | Status: AC
Start: 2023-04-07 — End: 2023-04-07
  Administered 2023-04-07: 20 meq via ORAL
  Filled 2023-04-07: qty 1

## 2023-04-07 MED ORDER — AMLODIPINE BESYLATE 10 MG PO TABS
10.0000 mg | ORAL_TABLET | Freq: Every day | ORAL | Status: DC
Start: 2023-04-08 — End: 2023-04-10
  Administered 2023-04-08 – 2023-04-10 (×3): 10 mg via ORAL
  Filled 2023-04-07 (×3): qty 1

## 2023-04-07 NOTE — Consult Note (Addendum)
Cardiology Consultation   Patient ID: JOSSELYN HARKINS MRN: [id number]; DOB: 1950-11-15  Admit date: 04/06/2023 Date of Consult: 04/07/2023  PCP:  Junie Spencer, FNP   [facility] Providers Cardiologist:  Lorine Bears, MD        Patient Profile:   CEONNA FRAZZINI is a 73 y.o. female with a hx of CAD (s/p CABG in 06/2020 with LIMA-LAD, SVG-LCx and SVG-Diag), PAD (s/p left common iliac artery stenting followed by bypass with left external iliac to left profundofemoral and chronically occluded bilateral SFAs), renovascular hypertension with bilateral renal stenting in 02/2019, chronic HFpEF, HTN, HLD and Type II DM who is being seen 04/07/2023 for the evaluation of elevated troponin values at the request of Dr. Wendall Stade.  History of Present Illness:   Ms. Valley presented to [facility] ED on 04/06/2023 for evaluation of significantly elevated BP at home and worsening dyspnea on exertion. Upon EMS arrival, BP had been elevated at 222/68.  She had been taking extra doses of her BP medications without improvement.   In talking with the patient today, she reports having worsening dyspnea on exertion and episodes of chest pressure for the past 3 to 4 weeks. This occurs with minimal activity such as doing chores around the house and making her bed. Reports symptoms typically resolve with rest. She has not utilized nitroglycerin as her tablets had expired. She denies any specific orthopnea, PND or pitting edema. Reports her blood pressure has been labile when checked at home despite compliance with her medications. She has been checking her heart rate and pulse ox at home and reports these have been within a normal range.  Initial labs showed WBC 8.8, Hgb 14.6, platelets 224, Na+ 138, K+ 4.0 and creatinine 0.67. AST 26 and ALT 32. Alk phos 46. Magnesium 1.9. Initial and repeat Hs Troponin values elevated at 320, 355 and 395. CXR with no active cardiopulmonary disease. CT Head with  no acute intracranial abnormalities. EKG showed normal sinus rhythm, heart rate 69 with slight ST depression along the lateral leads and noted on prior tracings.  She received nitroglycerin ointment while in the ED and was later started on nitroglycerin drip along with IV heparin for anticoagulation. She had been on Amlodipine 5 mg daily, Coreg 6.25 mg twice daily and Lisinopril 20 mg twice daily as an outpatient. Coreg was titrated to 12.5 mg twice daily along with her being continued on Amlodipine and Lisinopril.   Past Medical History:  Diagnosis Date   CAD (coronary artery disease)    a. 04/2012 NSTEMI: s/p DES to LAD.   Chronic diastolic CHF (congestive heart failure) (HCC)    Diabetes mellitus without complication (HCC)    Dysrhythmia    Environmental and seasonal allergies    Hyperlipidemia    Hypertension    Leukocytosis    Morbid obesity (HCC)    NSTEMI (non-ST elevated myocardial infarction) (HCC) 04/27/2012   PONV (postoperative nausea and vomiting)    PVD (peripheral vascular disease) (HCC)    a. 04/2012 ABI: R 0.49, L 0.39. Angiography 06/14: Significant ostial left common iliac artery stenosis extending into the distal aorta, severe left common femoral artery stenosis, bilateral SFA occlusion with heavy calcifications. Functionally, one-vessel runoff bilaterally below the knee   Renal artery stenosis (HCC)    s/p angioplasty/stenting bilateral renal arteries 03/16/19   Shortness of breath     Past Surgical History:  Procedure Laterality Date   ABDOMINAL AORTAGRAM N/A 07/21/2012   Procedure: ABDOMINAL  Ronny Flurry;  Surgeon: Iran Ouch, MD;  Location: [facility] CATH LAB;  Service: Cardiovascular;  Laterality: N/A;   ABDOMINAL AORTOGRAM W/LOWER EXTREMITY N/A 05/09/2020   Procedure: ABDOMINAL AORTOGRAM W/LOWER EXTREMITY;  Surgeon: Iran Ouch, MD;  Location: [facility] INVASIVE CV LAB;  Service: Cardiovascular;  Laterality: N/A;   CARDIAC CATHETERIZATION     stents X 2   CATARACT  EXTRACTION W/PHACO Left 09/08/2022   Procedure: CATARACT EXTRACTION PHACO AND INTRAOCULAR LENS PLACEMENT (IOC) with placement of Corticosteroid;  Surgeon: Fabio Pierce, MD;  Location: [facility] ORS;  Service: Ophthalmology;  Laterality: Left;  CDE 8.48   CORONARY ANGIOGRAM  04/27/2012   Procedure: CORONARY ANGIOGRAM;  Surgeon: Vesta Mixer, MD;  Location: [facility] CATH LAB;  Service: Cardiovascular;;   CORONARY ARTERY BYPASS GRAFT N/A 06/25/2020   Procedure: CORONARY ARTERY BYPASS GRAFTING (CABG)X3. LEFT INTERNAL MAMMARY ARTERY. RIGHT ENDOSCOPIC SAPHENOUS VEIN HARVESTING.;  Surgeon: Linden Dolin, MD;  Location: [facility] OR;  Service: Open Heart Surgery;  Laterality: N/A;   ENDARTERECTOMY FEMORAL Left 11/02/2013   Procedure: RESECTION LEFT COMMON FEMORAL ARTERY AND INSERTION OF INTERPOSITION 7mm HEMASHIELD GRAFT FROM LEFT EXTERNAL  ILIAC ARTERY TO LEFT PROFUNDA  FEMORIS ARTERY;  Surgeon: Pryor Ochoa, MD;  Location: [facility] OR;  Service: Vascular;  Laterality: Left;   EYE SURGERY Right 2017   FRACTURE SURGERY Right Jul 07, 2014   Right Foot-  [place]  at Home  by Heat duct   ILIAC ARTERY STENT Left 08/31/2013   LAD stent     LEFT HEART CATH AND CORONARY ANGIOGRAPHY N/A 05/09/2020   Procedure: LEFT HEART CATH AND CORONARY ANGIOGRAPHY;  Surgeon: Iran Ouch, MD;  Location: [facility] INVASIVE CV LAB;  Service: Cardiovascular;  Laterality: N/A;   LEFT HEART CATHETERIZATION WITH CORONARY ANGIOGRAM N/A 04/23/2012   Procedure: LEFT HEART CATHETERIZATION WITH CORONARY ANGIOGRAM;  Surgeon: Peter M Swaziland, MD;  Location: [facility] CATH LAB;  Service: Cardiovascular;  Laterality: N/A;   LOWER EXTREMITY ANGIOGRAM Left 08/31/2013   Procedure: LOWER EXTREMITY ANGIOGRAM;  Surgeon: Iran Ouch, MD;  Location: [facility] CATH LAB;  Service: Cardiovascular;  Laterality: Left;   PERCUTANEOUS STENT INTERVENTION Left 08/31/2013   Procedure: PERCUTANEOUS STENT INTERVENTION;  Surgeon: Iran Ouch, MD;  Location: [facility] CATH LAB;  Service:  Cardiovascular;  Laterality: Left;  Common Iliac artery   PERIPHERAL VASCULAR CATHETERIZATION N/A 03/19/2016   Procedure: Abdominal Aortogram w/Lower Extremity;  Surgeon: Iran Ouch, MD;  Location: [facility] INVASIVE CV LAB;  Service: Cardiovascular;  Laterality: N/A;   PERIPHERAL VASCULAR CATHETERIZATION  03/19/2016   Procedure: Peripheral Vascular Balloon Angioplasty-CFA Left;  Surgeon: Iran Ouch, MD;  Location: [facility] INVASIVE CV LAB;  Service: Cardiovascular;;   PERIPHERAL VASCULAR INTERVENTION Bilateral 03/16/2019   Procedure: PERIPHERAL VASCULAR INTERVENTION;  Surgeon: Iran Ouch, MD;  Location: [facility] INVASIVE CV LAB;  Service: Cardiovascular;  Laterality: Bilateral;  RENAL   PERIPHERAL VASCULAR INTERVENTION Bilateral 05/09/2020   Procedure: PERIPHERAL VASCULAR INTERVENTION;  Surgeon: Iran Ouch, MD;  Location: [facility] INVASIVE CV LAB;  Service: Cardiovascular;  Laterality: Bilateral;  Iliac   RENAL ANGIOGRAPHY  03/16/2019   RENAL ANGIOGRAPHY Bilateral 03/16/2019   Procedure: RENAL ANGIOGRAPHY;  Surgeon: Iran Ouch, MD;  Location: [facility] INVASIVE CV LAB;  Service: Cardiovascular;  Laterality: Bilateral;   TEE WITHOUT CARDIOVERSION N/A 06/25/2020   Procedure: TRANSESOPHAGEAL ECHOCARDIOGRAM (TEE);  Surgeon: Linden Dolin, MD;  Location: [facility] OR;  Service: Open Heart Surgery;  Laterality: N/A;   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 Authorizing Provider  acetaminophen (TYLENOL) 325 MG tablet Take 2 tablets (650 mg total) by mouth every 6 (six) hours as needed for mild pain (or Fever >/= 101). 12/13/18  Yes Emokpae, Courage, MD  albuterol (VENTOLIN HFA) 108 (90 Base) MCG/ACT inhaler INHALE 1 TO 2 PUFFS BY MOUTH EVERY 6 HOURS AS NEEDED FOR WHEEZING AND FOR SHORTNESS OF BREATH 03/10/23  Yes Hawks, Christy A, FNP  amLODipine (NORVASC) 5 MG tablet Take 1 tablet by mouth once daily  02/04/23  Yes Hawks, Christy A, FNP  Ascorbic Acid (VITAMIN C) 1000 MG tablet Take 1,000 mg by mouth 3 (three) times daily.    Yes [provider]  aspirin EC 81 MG tablet Take 81 mg by mouth daily. In the morning   Yes [provider]  Calcium Carbonate-Vit D-Min (CALTRATE 600+D PLUS MINERALS) 600-800 MG-UNIT TABS Take 1 tablet by mouth 3 (three) times daily.    Yes [provider]  carvedilol (COREG) 6.25 MG tablet Take 1 tablet by mouth twice daily 12/23/22  Yes Iran Ouch, MD  Chlorpheniramine-Acetaminophen (CORICIDIN HBP COLD/FLU PO) Take 1 tablet by mouth daily as needed (cough/cold).   Yes [provider]  Cholecalciferol (VITAMIN D-3) 125 MCG (5000 UT) TABS Take 5,000 Units by mouth daily.   Yes [provider]  clopidogrel (PLAVIX) 75 MG tablet Take 1 tablet by mouth once daily 02/12/23  Yes Hawks, Christy A, FNP  fish oil-omega-3 fatty acids 1000 MG capsule Take 1 g by mouth daily.    Yes [provider]  hydroxypropyl methylcellulose (ISOPTO TEARS) 2.5 % ophthalmic solution Place 1 drop into both eyes 3 (three) times daily as needed (for dry eyes).   Yes [provider]  insulin degludec (TRESIBA FLEXTOUCH) 100 UNIT/ML FlexTouch Pen Inject 15-30 Units into the skin daily. Patient taking differently: Inject 20 Units into the skin daily. 09/09/22  Yes Hawks, Christy A, FNP  insulin lispro (HUMALOG KWIKPEN) 200 UNIT/ML KwikPen Inject 50 Units into the skin 3 (three) times daily before meals. 02/06/23  Yes Hawks, Neysa Bonito A, FNP  lisinopril (ZESTRIL) 20 MG tablet Take 1 tablet by mouth twice daily 04/02/23  Yes Hawks, Christy A, FNP  montelukast (SINGULAIR) 10 MG tablet Take 1 tablet by mouth once daily with breakfast 10/29/22  Yes Hawks, Christy A, FNP  rosuvastatin (CRESTOR) 40 MG tablet Take 1 tablet by mouth once daily 03/16/23  Yes Hawks, Christy A, FNP  Semaglutide, 2 MG/DOSE, (OZEMPIC, 2 MG/DOSE,) 8 MG/3ML SOPN Inject 2 mg  into the skin once a week. 09/09/22  Yes Hawks, Neysa Bonito A, FNP  BD ULTRA-FINE PEN NEEDLES 29G X 12.7MM MISC USE PENNEEDLES 3 TIMES  DAILY AS DIRECTED 06/17/18   Junie Spencer, FNP  blood glucose meter kit and supplies Dispense based on patient and insurance preference. Use up to four times daily as directed. (FOR ICD-10 E10.9, E11.9). 12/19/19   Jannifer Rodney A, FNP  glucose blood (GLUCOSE METER TEST) test strip Use 6-10 times a day, Uses ReliOn 12/19/19   Jannifer Rodney A, FNP  Insulin Syringe 27G X 1/2" 0.5 ML MISC Use with Fiasp insulin TID 04/02/20   Jannifer Rodney A, FNP  RELION PEN NEEDLES 29G X MISC USE 1 NEEDLE TO INJECT INSULIN SUBCUTANEOUSLY 3 TIMES DAILY Patient taking differently: 1 Device by Subconjunctival route 3 (three) times daily. 01/31/16  Junie Spencer, FNP    Inpatient Medications: Scheduled Meds:  amLODipine  5 mg Oral Daily   aspirin EC  81 mg Oral Daily   carvedilol  12.5 mg Oral BID   Chlorhexidine Gluconate Cloth  6 each Topical Q0600   clopidogrel  75 mg Oral Daily   insulin aspart  0-15 Units Subcutaneous TID WC   insulin aspart  0-5 Units Subcutaneous QHS   insulin glargine-yfgn  15 Units Subcutaneous QHS   lisinopril  20 mg Oral BID   montelukast  10 mg Oral QHS   potassium chloride  20 mEq Oral Once   rosuvastatin  40 mg Oral Daily   Continuous Infusions:  heparin 1,000 Units/hr (04/07/23 0858)   nitroGLYCERIN 80 mcg/min (04/07/23 0858)   PRN Meds: acetaminophen **OR** acetaminophen, albuterol, ondansetron **OR** ondansetron (ZOFRAN) IV, polyethylene glycol  Allergies:    Allergies  Allergen Reactions   Tape Rash    Social History:   Social History   Socioeconomic History   Marital status: Widowed    Spouse name: Not on file   Number of children: 4   Years of education: completed school in [place]    Highest education level: Not on file  Occupational History   Occupation: retired  Tobacco Use   Smoking status: Former    Current  packs/day: 0.00    Average packs/day: 1 pack/day for 36.8 years (36.8 ttl pk-yrs)    Types: Cigarettes    Start date: 04/21/1964    Quit date: 02/17/2001    Years since quitting: 22.1   Smokeless tobacco: Never  Vaping Use   Vaping status: Never Used  Substance and Sexual Activity   Alcohol use: No    Alcohol/week: 0.0 standard drinks of alcohol   Drug use: No   Sexual activity: Not Currently    Birth control/protection: Post-menopausal  Other Topics Concern   Not on file  Social History Narrative   Lives alone - one level - all of her children live far away    Family History:    Family History  Problem Relation Age of Onset   Diabetes Mother    Hyperlipidemia Mother    Hypertension Mother    Peripheral vascular disease Mother    Other Sister        drowned   Other Sister        drowned   Diabetes Daughter        borderline   Hypertension Daughter    Diabetes Maternal Grandmother    Heart attack Maternal Grandfather    Heart disease Brother    Diabetes Brother    Alcohol abuse Brother    Diabetes Son    Hypertension Son    Hyperlipidemia Son    Breast cancer Neg Hx      ROS:  Please see the history of present illness.   All other ROS reviewed and negative.     Physical Exam/Data:   Vitals:   04/07/23 0600 04/07/23 0800 04/07/23 0809 04/07/23 1000  BP: (!) 158/59 (!) 167/59  (!) 157/48  Pulse: 63 69  67  Resp: 16 (!) 22  20  Temp:   98.5 F (36.9 C)   TempSrc:   Oral   SpO2: (!) 87% 95%  98%  Weight:      Height:        Intake/Output Summary (Last 24 hours) at 04/07/2023 1107 Last data filed at 04/07/2023 0858 Gross per 24 hour  Intake 931.08 ml  Output --  Net 931.08 ml      04/07/2023    3:00 AM 04/06/2023   11:24 PM 04/06/2023   11:43 AM  Last 3 Weights  Weight (lbs) 198 lb 13.7 oz 198 lb 13.7 oz 199 lb 15.3 oz  Weight (kg) 90.2 kg 90.2 kg 90.7 kg     Body mass index is 35.79 kg/m.  General:  Well nourished, well developed female appearing  in no acute distress HEENT: normal Neck: no JVD Vascular: No carotid bruits; Distal pulses 2+ bilaterally Cardiac:  normal S1, S2; RRR; no murmur  Lungs:  clear to auscultation bilaterally, no wheezing, rhonchi or rales  Abd: soft, nontender, no hepatomegaly  Ext: no pitting edema Musculoskeletal:  No deformities, BUE and BLE strength normal and equal Skin: warm and dry  Neuro:  CNs 2-12 intact, no focal abnormalities noted Psych:  Normal affect   EKG:  The EKG was personally reviewed and demonstrates: Normal sinus rhythm, HR 69 with slight ST depression along the lateral leads and noted on prior tracings.  Telemetry:  Telemetry was personally reviewed and demonstrates:  NSR, HR in 60's to 70's with occasional PVC's.   Relevant CV Studies:  Echocardiogram: 04/2020 IMPRESSIONS     1. Left ventricular ejection fraction, by estimation, is 60 to 65%. The  left ventricle has normal function. The left ventricle has no regional  wall motion abnormalities. There is moderate concentric left ventricular  hypertrophy. Left ventricular  diastolic parameters are consistent with Grade I diastolic dysfunction  (impaired relaxation). Elevated left atrial pressure.   2. Right ventricular systolic function is normal. The right ventricular  size is normal. There is normal pulmonary artery systolic pressure.   3. Left atrial size was mildly dilated.   4. The mitral valve is normal in structure. Mild mitral valve  regurgitation. Mild mitral stenosis.   5. The aortic valve is normal in structure. Aortic valve regurgitation is  not visualized. No aortic stenosis is present.   6. The inferior vena cava is normal in size with <50% respiratory  variability, suggesting right atrial pressure of 8 mmHg.   Renal Artery Duplex: 11/2022 Summary:  Renal:    Right: Evidence of a > 60% stenosis of the right renal artery. RRV         flow present. Normal size right kidney. Abnormal right         Resistive  Index. Normal cortical thickness of right kidney.  Left:  Evidence of a > 60% stenosis in the left renal artery. LRV         flow present. Cyst(s) noted. Normal size of left kidney.         Abnormal left Resisitve Index. Normal cortical thickness of         the left kidney.  Mesenteric:  70 to 99% stenosis in the superior mesenteric artery. Areas of limited  visceral  study include mesenteric arteries, right renal artery and left renal  artery.  Technically difficult study with suboptimal imaging secondary to body  habitus  and bowel gas.      Laboratory Data:  High Sensitivity Troponin:   Recent Labs  Lab 04/06/23 1357 04/06/23 1553 04/06/23 2311  TROPONINIHS 320* 355* 395*     Chemistry Recent Labs  Lab 04/06/23 1357 04/07/23 0345  NA 138 136  K 4.0 3.4*  CL 99 98  CO2 31 26  GLUCOSE 141* 155*  BUN 10 15  CREATININE 0.67 0.62  CALCIUM 9.4 9.2  MG 1.9  --  GFRNONAA >60 >60  ANIONGAP 8 12    Recent Labs  Lab 04/06/23 1357  PROT 7.6  ALBUMIN 3.7  AST 26  ALT 32  ALKPHOS 46  BILITOT 0.4   Lipids No results for input(s): "CHOL", "TRIG", "HDL", "LABVLDL", "LDLCALC", "CHOLHDL" in the last 168 hours.  Hematology Recent Labs  Lab 04/06/23 1357 04/07/23 0345  WBC 8.8 9.3  RBC 4.70 4.28  HGB 14.6 13.5  HCT 44.6 39.9  MCV 94.9 93.2  MCH 31.1 31.5  MCHC 32.7 33.8  RDW 13.7 13.9  PLT 224 213   Thyroid No results for input(s): "TSH", "FREET4" in the last 168 hours.  BNPNo results for input(s): "BNP", "PROBNP" in the last 168 hours.  DDimer No results for input(s): "DDIMER" in the last 168 hours.   Radiology/Studies:  DG Chest Port 1 View Result Date: 04/06/2023 CLINICAL DATA:  Syncope/presyncope.  Hypertension. EXAM: PORTABLE CHEST 1 VIEW COMPARISON:  07/12/2020 FINDINGS: Previous median sternotomy. Heart size remains normal. Aortic atherosclerotic calcification as seen previously. The lungs are clear. No pulmonary edema. No pleural effusion. No visible  acute bone finding. IMPRESSION: No active disease. Previous median sternotomy. Aortic atherosclerotic calcification. Electronically Signed   By: Paulina Fusi M.D.   On: 04/06/2023 14:51   CT Head Wo Contrast Result Date: 04/06/2023 CLINICAL DATA:  Syncope/presyncope. Cerebrovascular cause suspected. Hypertension. EXAM: CT HEAD WITHOUT CONTRAST TECHNIQUE: Contiguous axial images were obtained from the base of the skull through the vertex without intravenous contrast. RADIATION DOSE REDUCTION: This exam was performed according to the departmental dose-optimization program which includes automated exposure control, adjustment of the mA and/or kV according to patient size and/or use of iterative reconstruction technique. COMPARISON:  11/22/2018 FINDINGS: Brain: No acute CT finding. Advanced chronic small-vessel ischemic changes of the cerebral hemispheric white matter. No sign of acute infarction, mass lesion, hemorrhage, hydrocephalus or extra-axial collection. Vascular: There is atherosclerotic calcification of the major vessels at the base of the brain. Skull: Negative Sinuses/Orbits: Clear/normal Other: None IMPRESSION: No acute CT finding. Advanced chronic small-vessel ischemic changes of the cerebral hemispheric white matter. Electronically Signed   By: Paulina Fusi M.D.   On: 04/06/2023 14:51     Assessment and Plan:   1. NSTEMI - While she initially presented with hypertensive urgency, she reports a 3 to 4-week history of progressive dyspnea on exertion and chest pain which is concerning for accelerating angina. Hs Troponin values elevated at 320, 355 and 395. Will recheck with AM labs. Will also obtain an echocardiogram to assess for any structural abnormalities. - Given her symptoms and elevated enzymes, would anticipate a cardiac catheterization tomorrow for definitive evaluation. Reviewed with the patient and she is in agreement with this. As discussed below, Dr. Kirke Corin has recommended renal  angiography at that time as well. - Continue IV Heparin, IV nitroglycerin, ASA 81 mg daily, Plavix 75 mg daily, Coreg 12.5 mg twice daily and Crestor 40 mg daily.  2. CAD - She is s/p CABG in 06/2020 with LIMA-LAD, SVG-LCx and SVG-Diag. Plan for repeat cardiac catheterization as discussed above. - Continue IV Heparin, IV NTG, ASA, Plavix, Coreg and Crestor.  3. PAD  - She is s/p left common iliac artery stenting followed by bypass with left external iliac to left profundofemoral and chronically occluded bilateral SFAs. Followed by Vascular Surgery.   4. Renal Artery Stenosis - She underwent bilateral renal artery stenting in the past. We did receive a message from Dr. Kirke Corin today who states he can perform  renal angiography at the time of her cardiac catheterization tomorrow. Reviewed with the patient and have also made the Cath Lab aware.  5. Hypertensive Urgency - BP improved, at 157/48 on most recent check. She remains on IV Nitroglycerin which can be weaned as able. Remains on Amlodipine 5 mg daily, Coreg 12.5 mg twice daily and Lisinopril 20 mg twice daily.  Can further titrate Amlodipine pending BP trend.  6. HLD - LDL was 80 when checked in 11/2022. Will recheck with follow-up labs. Remains on Crestor 40 mg daily. If LDL remains above goal, would add Zetia and consider PCSK9 inhibitor therapy as an outpatient.  7. Type 2 DM - Hgb A1c was at 6.2 in 11/2022. Management per the admitting team.  For questions or updates, please contact [facility] Please consult [email or web address] for contact info under    Signed, Ellsworth Lennox, PA-C  04/07/2023 11:07 AM  Attending Note  Patient seen and discussed with PA Iran Ouch, I agree with her documentation. 73 yo female history of CAD CABG in 06/2020 with LIMA-LAD, SVG-LCx and SVG-Diag , PAD with prior interventios as described above, bilateral renal artery stenting, chronic HFpEF, HTN, HLD, DM2 presents with chest pain and  HTN.   ER vitals: p 92 bp 185/55 98% K 4 Cr 0.67 BUN 10 WBC 8.8 Hgb 14.6 Plt 224 Mg 1.9 Trop 320-->355-->395--> EKG SR, nonspecific ST/T changes CXR no acute process CT head: no acute process    1.NSTEMI - history of CAD with prior CABG 06/2020 LIMA-LAD, SVG-LCx and SVG-Diag - presents with progressing exertional chest pains.  - trops up to 395 without clear peak, EKG without specific ischemic changes - echo is pending  - medical therapy with ASA 81, coreg 12.5mg  bid, plavix 75, hep gtt, lisinopril 98m, crestor 40mg  daily - plan for transfer to [facility] for cath tomorrow. Have reviewed with her primary cardiologist Dr Kirke Corin who will perform the cath and also has recommend renal angiography given renal stents and presentation with severe HTN - is stable coronaries may have been driven all by HTN   2. HTN emergency - SBPs 180s-190s and MAPs, lower DBPs I suspect measurement error. Presented with chest pain, elevated troponin - started on NG gtt at 85 mcg/min, current SBPs 150s gradually coming down - oral regimen norvasc 5mg , coreg 12.5mg  bid, lisinopril 20mg  bid. Coreg had been increased this admission, can increase norvasc as well to 10mg  - wean NG drip - with severe HTN and prior renal artery stents bilaterally plan for renal angiography at time of cath with Dr Kirke Corin.    Dina Rich MD

## 2023-04-07 NOTE — Progress Notes (Signed)
Progress Note   Patient: Leah Ferrell:[id number] DOB: Jan 06, 1951 DOA: 04/06/2023     1 DOS: the patient was seen and examined on 04/07/2023   Brief hospital admission narrative: As per H&P written by Dr. Mariea Clonts on 04/06/2023 Leah Ferrell is a 73 y.o. female with medical history significant for CABG, peripheral artery disease, diabetes mellitus, hypertension, diastolic CHF. Patient presented to the ED with complaints of high blood pressure.  On EMS arrival, blood pressure was 222/68.  Patient reported she took an extra dose of her amlodipine because her blood pressure kept increasing despite taking her home medications.  She denies headache, no visual changes.  No difficulty breathing.  No chest pain.   ED Course: Blood pressure elevated to 197/66.  Temperature 98.  Heart rate mostly 60s.  Respiratory rate 14-19.  O2 sats greater than 92% on room air. EKG shows some subtle ST depressions, so troponin was obtained which was elevated at 320 > 355.  Unremarkable BMP CBC. Head CT negative for acute abnormality, advanced chronic small vessel ischemic changes..  Chest x-ray also unremarkable. EDP to Dr. Wyline Mood, commended starting heparin drip will see in consult in AM.  Nitroglycerin also started for elevated blood pressure.    Assessment and Plan: * Hypertensive urgency -Blood pressure elevated to 197/66 at time of admission.   -Patient reports compliant with antihypertensive agents at home; experiencing associated chest pain and elevated troponin. -Requiring nitroglycerin drip; given elevated troponin heparin drip also started. -Continue adjusted dose of Norvasc, carvedilol, lisinopril. -Cardiology service assistance and recommendation appreciated; planning for patient to go to [facility] for cardiac cath evaluation. -Wean off nitroglycerin drip as able.  Elevated troponin/NSTEMI -History of CABG x 3.  -Continue nitroglycerin and heparin drip -Cardiology service consulted with  plan to transfer to [facility] for cardiac cath on 04/08/2023 -Continue Crestor, aspirin and Plavix -Follow cardiology service recommendations.  Chronic diastolic heart failure (HCC) -Stable and compensated -Repeat echo -Maintain adequate hydration, follow a strict I's and O's and low-sodium diet. -Patient instructed to check weight on daily basis.  Diabetes mellitus with peripheral vascular disease (HCC) -Recent A1c 6.2 -Continue to follow CBG fluctuation and adjust hypoglycemic regimen as needed -Sliding scale insulin and adjusted dose of long insulin will be continued. -Modified carbohydrate diet discussed with patient.  GERD/GI prophylaxis -continue PPI  Obesity class 2 -Body mass index is 35.79 kg/m. -Low-calorie diet and portion control discussed with patient.   Subjective:  Currently chest pain-free; no nausea or vomiting.  Reports a mild headache.  Good saturation on room air.  Heparin drip and nitroglycerin drip in place.  Physical Exam: Vitals:   04/07/23 0800 04/07/23 0809 04/07/23 1000 04/07/23 1128  BP: (!) 167/59  (!) 157/48   Pulse: 69  67   Resp: (!) 22  20   Temp:  98.5 F (36.9 C)  97.7 F (36.5 C)  TempSrc:  Oral  Oral  SpO2: 95%  98%   Weight:      Height:       General exam: Alert, awake, oriented x 3; at time of evaluation chest pain-free and reporting just mild headache.  No requiring oxygen supplementation. Respiratory system: Good saturation on room no rubs, no gallops, no JVD on exam. Cardiovascular system:RRR. No murmurs, rubs, gallops. Gastrointestinal system: Abdomen is obese, nondistended, soft and nontender. No organomegaly or masses felt. Normal bowel sounds heard. Central nervous system: No focal neurological deficits. Extremities: No cyanosis or clubbing; no edema on exam.  Skin: No open wounds; some bruises and mild petechiae changes appreciated on her right upper extremity. Psychiatry: Judgement and insight appear normal.  Mood & affect appropriate.   Data Reviewed: CBC: WBCs 9.3, hemoglobin 13.5 and platelet count 2 13K Basic metabolic panel: Sodium 136, potassium 3.4, chloride 98, bicarb 26, BUN 15, creatinine 0.61 GFR >60 Troponin: 320 >> 355 >> 395 >>258  Family Communication: No family at bedside.  Disposition: Status is: Inpatient Remains inpatient appropriate because: Continue IV therapy and further workup/evaluation as dictated by cardiology service.  Patient will be transferred to [facility] for anticipated cardiac cath on 04/08/2023; continue nitroglycerin and heparin drip.   Planned Discharge Destination: Home   CRITICAL CARE Performed by: Vassie Loll   Total critical care time: 55 minutes  Critical care time was exclusive of separately billable procedures and treating other patients.  Critical care was necessary to treat or prevent imminent or life-threatening deterioration.  Critical care was time spent personally by me on the following activities: development of treatment plan with patient and/or surrogate as well as nursing, discussions with consultants, evaluation of patient's response to treatment, examination of patient, obtaining history from patient or surrogate, ordering and performing treatments and interventions, ordering and review of laboratory studies, ordering and review of radiographic studies, pulse oximetry and re-evaluation of patient's condition.   Author: Vassie Loll, MD 04/07/2023 12:15 PM  For on call review [email or web address].

## 2023-04-07 NOTE — Plan of Care (Signed)

## 2023-04-07 NOTE — Progress Notes (Signed)
CRITICAL VALUE STICKER  CRITICAL VALUE: Troponin 395  RECEIVER (on-site recipient of call): Thera Flake, RN  MD NOTIFIED: Dr. Andrez Grime  TIME OF NOTIFICATION:1226  RESPONSE:  continue Heparin gtt

## 2023-04-07 NOTE — Progress Notes (Signed)
PHARMACY - ANTICOAGULATION CONSULT NOTE  Pharmacy Consult for heparin Indication: chest pain/ACS  Allergies  Allergen Reactions   Tape Rash    Patient Measurements: Height: 5' 2.5" (158.8 cm) Weight: 90.2 kg (198 lb 13.7 oz) IBW/kg (Calculated) : 51.25 Heparin Dosing Weight: 71 kg  Vital Signs: Temp: 98.5 F (36.9 C) (02/18 0809) Temp Source: Oral (02/18 0809) BP: 167/59 (02/18 0800) Pulse Rate: 69 (02/18 0800)  Labs: Recent Labs    04/06/23 1357 04/06/23 1553 04/06/23 2311 04/07/23 0345 04/07/23 0808  HGB 14.6  --   --  13.5  --   HCT 44.6  --   --  39.9  --   PLT 224  --   --  213  --   HEPARINUNFRC  --   --  0.30  --  0.37  CREATININE 0.67  --   --  0.62  --   TROPONINIHS 320* 355* 395*  --   --     Estimated Creatinine Clearance: 68.1 mL/min (by C-G formula based on SCr of 0.62 mg/dL).  Assessment: Pharmacy consulted to dose heparin in patient with chest pain/ACS.  Patient is not on anticoagulation prior to admission. CBC WNL; Trop 395  HL 0.37    Goal of Therapy:  Heparin level 0.3-0.7 units/ml Monitor platelets by anticoagulation protocol: Yes   Plan:  Continue heparin infusion at 1000 units/hr  Check confirmatory anti-Xa level daily while on heparin Continue to monitor H&H and platelets  Judeth Cornfield, PharmD Clinical Pharmacist 04/07/2023 9:03 AM

## 2023-04-07 NOTE — Progress Notes (Incomplete)
Attending Note  Patient seen and discussed with PA Iran Ouch, I agree with her documentation. 72 yo female history of CAD CABG in 06/2020 with LIMA-LAD, SVG-LCx and SVG-Diag , PAD with prior interventios as described above, bilateral renal artery stenting, chronic HFpEF, HTN, HLD, DM2 presents with chest pain and HTN.   ER vitals: p 92 bp 185/55 98% K 4 Cr 0.67 BUN 10 WBC 8.8 Hgb 14.6 Plt 224 Mg 1.9 Trop 320-->355-->395--> EKG SR, nonspecific ST/T changes CXR no acute process CT head: no acute process    1.NSTEMI - history of CAD with prior CABG 06/2020 LIMA-LAD, SVG-LCx and SVG-Diag - presents with progressing exertional chest pains.  - trops up to 395 without clear peak, EKG without specific ischemic changes - echo is pending  - medical therapy with ASA 81, coreg 12.5mg  bid, plavix 75, hep gtt, lisinopril 53m, crestor 40mg  daily - plan for transfer to [facility] for cath tomorrow. Have reviewed with her primary cardiologist Dr Kirke Corin who will perform the cath and also has recommend renal angiography given renal stents and presentation with severe HTN - is stable coronaries may have been driven all by HTN   2. HTN emergency - SBPs 180s-190s and MAPs, lower DBPs I suspect measurement error. Presented with chest pain, elevated troponin - started on NG gtt at 85 mcg/min, current SBPs 150s gradually coming down - oral regimen norvasc 5mg , coreg 12.5mg  bid, lisinopril 20mg  bid. Coreg had been increased this admission, can increase norvasc as well to 10mg  - wean NG drip - with severe HTN and prior renal artery stents bilaterally plan for renal angiography at time of cath with Dr Kirke Corin.    Dina Rich MD

## 2023-04-07 NOTE — Progress Notes (Signed)
Pt arrived from [place] at 2127. MAR indicated that pt was on 74mcg/min nitroglycerin drip, but the pump was actually set at 142mcg/min.

## 2023-04-07 NOTE — Progress Notes (Deleted)
 Cardiology Office Note   Date:  04/07/2023   ID:  Leah, Ferrell Feb 18, 1951, MRN [id number]  PCP:  Leah Spencer, FNP  Cardiologist: Dr. Kirke Corin   No chief complaint on file.    History of Present Illness: Leah Ferrell is a 73 y.o. female who presents for a followup visit regarding peripheral arterial disease and coronary artery disease.  She has known history of coronary artery disease status post drug-eluting stent placement to the LAD X 2 with subsequent CABG in May 2022. She is known to have peripheral arterial disease status post left common iliac artery stent placement followed by left common femoral artery resection and bypass from left external iliac to the left profunda done by Dr. Hart Rochester in 2015. She is known to have chronically occluded bilateral SFAs being managed medically.  She had worsening left leg claudication in 2018 with elevated velocity at the proximal anastomosis of the graft.   Angiography in March 2018 showed mild to moderate calcified common iliac artery disease with patent stent in the left common iliac artery. There was severe stenosis in the left common femoral artery at the proximal anastomosis of the bypass to the profunda. I performed successful angioplasty and drug-coated balloon angioplasty.   She is status post bilateral renal artery stenting in January 2021 due to refractory hypertension and recurrent heart failure.  She had significant improvement in blood pressure control as well as heart failure symptoms since then.    Lower extremity angiography in March of 2022 showed patent renal artery stents with significant restenosis on the right side, severe bilateral heavily calcified ostial common iliac artery disease extending into the distal aorta.  The iliac arteries were treated by successful kissing stent placement.  On the right side, there was moderate common femoral artery stenosis as well as chronically occluded SFA with reconstitution  distally via collaterals from the profunda, short occlusion of above-the-knee popliteal artery with collaterals and three-vessel runoff below the knee.  On the left side, there was flush occlusion of the SFA with collaterals from the profunda and two-vessel runoff below the knee. She underwent CABG in May, 2022 with LIMA to LAD, SVG to OM and SVG to diagonal.  Most recent lower extremity arterial Doppler studies in October 2023 showed a decrease in ABI to the 0.4 range bilaterally.  Her ABIs were in the 0.5 range before.  Duplex was overall limited due to heavy calcifications of the iliac arteries.  She was noted to have moderately elevated velocities in the external iliac arteries.  She has been doing reasonably well with no chest pain or worsening dyspnea.  She has stable bilateral leg claudication and continues to have neuropathy symptoms.   Past Medical History:  Diagnosis Date   CAD (coronary artery disease)    a. 04/2012 NSTEMI: s/p DES to LAD.   Chronic diastolic CHF (congestive heart failure) (HCC)    Diabetes mellitus without complication (HCC)    Dysrhythmia    Environmental and seasonal allergies    Hyperlipidemia    Hypertension    Leukocytosis    Morbid obesity (HCC)    NSTEMI (non-ST elevated myocardial infarction) (HCC) 04/27/2012   PONV (postoperative nausea and vomiting)    PVD (peripheral vascular disease) (HCC)    a. 04/2012 ABI: R 0.49, L 0.39. Angiography 06/14: Significant ostial left common iliac artery stenosis extending into the distal aorta, severe left common femoral artery stenosis, bilateral SFA occlusion with heavy calcifications. Functionally, one-vessel runoff bilaterally  below the knee   Renal artery stenosis [facility])    s/p angioplasty/stenting bilateral renal arteries 03/16/19   Shortness of breath     Past Surgical History:  Procedure Laterality Date   ABDOMINAL AORTAGRAM N/A 07/21/2012   Procedure: ABDOMINAL Ronny Flurry;  Surgeon: Iran Ouch, MD;   Location: [facility] CATH LAB;  Service: Cardiovascular;  Laterality: N/A;   ABDOMINAL AORTOGRAM W/LOWER EXTREMITY N/A 05/09/2020   Procedure: ABDOMINAL AORTOGRAM W/LOWER EXTREMITY;  Surgeon: Iran Ouch, MD;  Location: [facility] INVASIVE CV LAB;  Service: Cardiovascular;  Laterality: N/A;   CARDIAC CATHETERIZATION     stents X 2   CATARACT EXTRACTION W/PHACO Left 09/08/2022   Procedure: CATARACT EXTRACTION PHACO AND INTRAOCULAR LENS PLACEMENT (IOC) with placement of Corticosteroid;  Surgeon: Fabio Pierce, MD;  Location: [facility] ORS;  Service: Ophthalmology;  Laterality: Left;  CDE 8.48   CORONARY ANGIOGRAM  04/27/2012   Procedure: CORONARY ANGIOGRAM;  Surgeon: Vesta Mixer, MD;  Location: [facility] CATH LAB;  Service: Cardiovascular;;   CORONARY ARTERY BYPASS GRAFT N/A 06/25/2020   Procedure: CORONARY ARTERY BYPASS GRAFTING (CABG)X3. LEFT INTERNAL MAMMARY ARTERY. RIGHT ENDOSCOPIC SAPHENOUS VEIN HARVESTING.;  Surgeon: Linden Dolin, MD;  Location: [facility] OR;  Service: Open Heart Surgery;  Laterality: N/A;   ENDARTERECTOMY FEMORAL Left 11/02/2013   Procedure: RESECTION LEFT COMMON FEMORAL ARTERY AND INSERTION OF INTERPOSITION 7mm HEMASHIELD GRAFT FROM LEFT EXTERNAL  ILIAC ARTERY TO LEFT PROFUNDA  FEMORIS ARTERY;  Surgeon: Pryor Ochoa, MD;  Location: [facility] OR;  Service: Vascular;  Laterality: Left;   EYE SURGERY Right 2017   FRACTURE SURGERY Right Jul 07, 2014   Right Foot-  [place] Home  by Heat duct   ILIAC ARTERY STENT Left 08/31/2013   LAD stent     LEFT HEART CATH AND CORONARY ANGIOGRAPHY N/A 05/09/2020   Procedure: LEFT HEART CATH AND CORONARY ANGIOGRAPHY;  Surgeon: Iran Ouch, MD;  Location: [facility] INVASIVE CV LAB;  Service: Cardiovascular;  Laterality: N/A;   LEFT HEART CATHETERIZATION WITH CORONARY ANGIOGRAM N/A 04/23/2012   Procedure: LEFT HEART CATHETERIZATION WITH CORONARY ANGIOGRAM;  Surgeon: Peter M Swaziland, MD;  Location: [facility] CATH LAB;  Service: Cardiovascular;  Laterality: N/A;   LOWER EXTREMITY  ANGIOGRAM Left 08/31/2013   Procedure: LOWER EXTREMITY ANGIOGRAM;  Surgeon: Iran Ouch, MD;  Location: [facility] CATH LAB;  Service: Cardiovascular;  Laterality: Left;   PERCUTANEOUS STENT INTERVENTION Left 08/31/2013   Procedure: PERCUTANEOUS STENT INTERVENTION;  Surgeon: Iran Ouch, MD;  Location: [facility] CATH LAB;  Service: Cardiovascular;  Laterality: Left;  Common Iliac artery   PERIPHERAL VASCULAR CATHETERIZATION N/A 03/19/2016   Procedure: Abdominal Aortogram w/Lower Extremity;  Surgeon: Iran Ouch, MD;  Location: [facility] INVASIVE CV LAB;  Service: Cardiovascular;  Laterality: N/A;   PERIPHERAL VASCULAR CATHETERIZATION  03/19/2016   Procedure: Peripheral Vascular Balloon Angioplasty-CFA Left;  Surgeon: Iran Ouch, MD;  Location: [facility] INVASIVE CV LAB;  Service: Cardiovascular;;   PERIPHERAL VASCULAR INTERVENTION Bilateral 03/16/2019   Procedure: PERIPHERAL VASCULAR INTERVENTION;  Surgeon: Iran Ouch, MD;  Location: [facility] INVASIVE CV LAB;  Service: Cardiovascular;  Laterality: Bilateral;  RENAL   PERIPHERAL VASCULAR INTERVENTION Bilateral 05/09/2020   Procedure: PERIPHERAL VASCULAR INTERVENTION;  Surgeon: Iran Ouch, MD;  Location: [facility] INVASIVE CV LAB;  Service: Cardiovascular;  Laterality: Bilateral;  Iliac   RENAL ANGIOGRAPHY  03/16/2019   RENAL ANGIOGRAPHY Bilateral 03/16/2019   Procedure: RENAL ANGIOGRAPHY;  Surgeon: Iran Ouch, MD;  Location: [facility] INVASIVE CV LAB;  Service: Cardiovascular;  Laterality: Bilateral;   TEE WITHOUT CARDIOVERSION N/A 06/25/2020   Procedure: TRANSESOPHAGEAL ECHOCARDIOGRAM (TEE);  Surgeon: Linden Dolin, MD;  Location: [facility] OR;  Service: Open Heart Surgery;  Laterality: N/A;   TUMOR REMOVAL     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   No current outpatient medications on file.   Facility-Administered Medications Ordered in Other Visits  Medication Dose Route Frequency Provider Last Rate Last  Admin   acetaminophen (TYLENOL) tablet 650 mg  650 mg Oral Q6H PRN Emokpae, Ejiroghene E, MD       Or   acetaminophen (TYLENOL) suppository 650 mg  650 mg Rectal Q6H PRN Emokpae, Ejiroghene E, MD       albuterol (PROVENTIL) (2.5 MG/3ML) 0.083% nebulizer solution 3 mL  3 mL Inhalation Q4H PRN Emokpae, Ejiroghene E, MD       amLODipine (NORVASC) tablet 5 mg  5 mg Oral Daily Emokpae, Ejiroghene E, MD       aspirin EC tablet 81 mg  81 mg Oral Daily Emokpae, Ejiroghene E, MD       carvedilol (COREG) tablet 12.5 mg  12.5 mg Oral BID Vassie Loll, MD       Chlorhexidine Gluconate Cloth 2 % PADS 6 each  6 each Topical Q0600 Emokpae, Ejiroghene E, MD   6 each at 04/06/23 2328   clopidogrel (PLAVIX) tablet 75 mg  75 mg Oral Daily Emokpae, Ejiroghene E, MD       heparin ADULT infusion 100 units/mL (25000 units/260mL)  1,000 Units/hr Intravenous Continuous Arabella Merles, RPH 10 mL/hr at 04/07/23 0648 1,000 Units/hr at 04/07/23 0648   insulin aspart (novoLOG) injection 0-15 Units  0-15 Units Subcutaneous TID WC Emokpae, Ejiroghene E, MD       insulin aspart (novoLOG) injection 0-5 Units  0-5 Units Subcutaneous QHS Emokpae, Ejiroghene E, MD       insulin glargine-yfgn (SEMGLEE) injection 15 Units  15 Units Subcutaneous QHS Vassie Loll, MD       lisinopril (ZESTRIL) tablet 20 mg  20 mg Oral BID Emokpae, Ejiroghene E, MD       montelukast (SINGULAIR) tablet 10 mg  10 mg Oral QHS Emokpae, Ejiroghene E, MD   10 mg at 04/06/23 2343   nitroGLYCERIN 50 mg in dextrose 5 % 250 mL (0.2 mg/mL) infusion  5-200 mcg/min Intravenous Continuous Vassie Loll, MD 27 mL/hr at 04/07/23 0648 90 mcg/min at 04/07/23 0648   ondansetron (ZOFRAN) tablet 4 mg  4 mg Oral Q6H PRN Emokpae, Ejiroghene E, MD       Or   ondansetron (ZOFRAN) injection 4 mg  4 mg Intravenous Q6H PRN Emokpae, Ejiroghene E, MD       polyethylene glycol (MIRALAX / GLYCOLAX) packet 17 g  17 g Oral Daily PRN Emokpae, Ejiroghene E, MD       rosuvastatin  (CRESTOR) tablet 40 mg  40 mg Oral Daily Emokpae, Ejiroghene E, MD        Allergies:   Tape    Social History:  The patient  reports that she quit smoking about 22 years ago. Her smoking use included cigarettes. She started smoking about 59 years ago. She has a 36.8 pack-year smoking history. She has never used smokeless tobacco. She reports that she does not drink alcohol and does not use drugs.   Family History:  The patient's family history includes Alcohol abuse in her brother; Diabetes in her brother, daughter, maternal  grandmother, mother, and son; Heart attack in her maternal grandfather; Heart disease in her brother; Hyperlipidemia in her mother and son; Hypertension in her daughter, mother, and son; Other in her sister and sister; Peripheral vascular disease in her mother.    ROS:  Please see the history of present illness.   Otherwise, review of systems are positive for none.   All other systems are reviewed and negative.    PHYSICAL EXAM: VS:  There were no vitals taken for this visit. , BMI There is no height or weight on file to calculate BMI. GEN: Well nourished, well developed, in no acute distress  HEENT: normal  Neck: no JVD or masses.  Right carotid bruit. Cardiac: RRR; no  rubs, or gallops , trace edema . There is 2/6 systolic ejection murmur in the aortic area.  Respiratory:  clear to auscultation bilaterally, normal work of breathing GI: soft, nontender, nondistended, + BS MS: no deformity or atrophy  Skin: warm and dry, no rash Neuro:  Strength and sensation are intact Psych: euthymic mood, full affect Distal pulses are not palpable.    EKG:  EKG is not ordered today.    Recent Labs: 12/15/2022: TSH 1.700 04/06/2023: ALT 32; Magnesium 1.9 04/07/2023: BUN 15; Creatinine, Ser 0.62; Hemoglobin 13.5; Platelets 213; Potassium 3.4; Sodium 136    Lipid Panel    Component Value Date/Time   CHOL 174 12/15/2022 1144   TRIG 121 12/15/2022 1144   TRIG 135 09/16/2012  1322   HDL 73 12/15/2022 1144   HDL 60 09/16/2012 1322   CHOLHDL 2.4 12/15/2022 1144   CHOLHDL 3.2 05/06/2012 1713   VLDL 32 05/06/2012 1713   LDLCALC 80 12/15/2022 1144   LDLCALC 92 09/16/2012 1322      Wt Readings from Last 3 Encounters:  04/07/23 198 lb 13.7 oz (90.2 kg)  12/25/22 200 lb (90.7 kg)  12/15/22 200 lb 6.4 oz (90.9 kg)           No data to display            ASSESSMENT AND PLAN:  1. Peripheral arterial disease: Status post  kissing stent placement to the common iliac arteries due to severe right calf claudication as well as nonhealing ulceration on the right small toe.  Her ABI is severely reduced to 0.4 range but her claudication improved with an exercise program.  I requested follow-up ABI and aortoiliac duplex to be done in October.  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  She does have evidence of significant restenosis on the right side.  Repeat blood pressure today in the left arm was 142/70.  I requested a follow-up renal artery duplex.  3. Coronary artery disease involving native coronary arteries without angina: Symptoms resolved after CABG and she is doing well.  .      4. Hyperlipidemia: Continue high-dose rosuvastatin.  I reviewed most recent lipid profile done in January which showed an LDL of 78.  5.  Right carotid bruit: Most recent carotid Doppler in February showed mild nonobstructive disease.  Less than 40% bilaterally.   Disposition:   FU with me in 6 month  Signed,  Lorine Bears, MD  04/07/2023 8:04 AM    [facility]

## 2023-04-07 NOTE — TOC CM/SW Note (Signed)
Transition of Care [facility]) - Inpatient Brief Assessment   Patient Details  Name: Leah Ferrell MRN: [id number] Date of Birth: 1950/12/16  Transition of Care [facility]) CM/SW Contact:    Villa Herb, LCSWA Phone Number: 04/07/2023, 8:56 AM   Clinical Narrative: Transition of Care Department [facility]) has reviewed patient and no TOC needs have been identified at this time. We will continue to monitor patient advancement through interdiciplinary progression rounds. If new patient transition needs arise, please place a TOC consult.   Transition of Care Asessment: Insurance and Status: Insurance coverage has been reviewed Patient has primary care physician: Yes Home environment has been reviewed: From home Prior level of function:: Independent Prior/Current Home Services: No current home services Social Drivers of Health Review: SDOH reviewed no interventions necessary Readmission risk has been reviewed: Yes Transition of care needs: no transition of care needs at this time

## 2023-04-08 ENCOUNTER — Encounter (HOSPITAL_COMMUNITY)
Admission: EM | Disposition: A | Discharge status: Home / Self Care | Payer: Self-pay | Source: Home / Self Care | Attending: Internal Medicine

## 2023-04-08 ENCOUNTER — Inpatient Hospital Stay (HOSPITAL_COMMUNITY): Payer: Medicare Other

## 2023-04-08 DIAGNOSIS — E785 Hyperlipidemia, unspecified: Secondary | ICD-10-CM

## 2023-04-08 DIAGNOSIS — Z951 Presence of aortocoronary bypass graft: Secondary | ICD-10-CM | POA: Diagnosis not present

## 2023-04-08 DIAGNOSIS — I701 Atherosclerosis of renal artery: Secondary | ICD-10-CM | POA: Diagnosis not present

## 2023-04-08 DIAGNOSIS — I214 Non-ST elevation (NSTEMI) myocardial infarction: Secondary | ICD-10-CM | POA: Diagnosis not present

## 2023-04-08 DIAGNOSIS — I1A0 Resistant hypertension: Secondary | ICD-10-CM

## 2023-04-08 DIAGNOSIS — I2510 Atherosclerotic heart disease of native coronary artery without angina pectoris: Secondary | ICD-10-CM

## 2023-04-08 DIAGNOSIS — I160 Hypertensive urgency: Secondary | ICD-10-CM | POA: Diagnosis not present

## 2023-04-08 HISTORY — PX: LEFT HEART CATH AND CORS/GRAFTS ANGIOGRAPHY: CATH118250

## 2023-04-08 HISTORY — PX: CORONARY STENT INTERVENTION: CATH118234

## 2023-04-08 HISTORY — PX: RENAL ANGIOGRAPHY: CATH118260

## 2023-04-08 LAB — POCT ACTIVATED CLOTTING TIME
Activated Clotting Time: 371 s
Activated Clotting Time: 579 s
Activated Clotting Time: 417 s
Activated Clotting Time: 556 s

## 2023-04-08 LAB — BASIC METABOLIC PANEL
Anion gap: 11 (ref 5–15)
BUN: 16 mg/dL (ref 8–23)
CO2: 24 mmol/L (ref 22–32)
Calcium: 9 mg/dL (ref 8.9–10.3)
Chloride: 98 mmol/L (ref 98–111)
Creatinine, Ser: 0.83 mg/dL (ref 0.44–1.00)
GFR, Estimated: >60 mL/min (ref >60)
Glucose, Bld: 156 mg/dL — ABNORMAL HIGH (ref 70–99)
Potassium: 4 mmol/L (ref 3.5–5.1)
Sodium: 133 mmol/L — ABNORMAL LOW (ref 135–145)

## 2023-04-08 LAB — LIPID PANEL
Cholesterol: 128 mg/dL (ref 0–200)
HDL: 52 mg/dL (ref >40)
LDL Cholesterol: 49 mg/dL (ref 0–99)
Total CHOL/HDL Ratio: 2.5 {ratio}
Triglycerides: 134 mg/dL (ref <150)
VLDL: 27 mg/dL (ref 0–40)

## 2023-04-08 LAB — CBC
HCT: 37.1 % (ref 36.0–46.0)
Hemoglobin: 12.4 g/dL (ref 12.0–15.0)
MCH: 30.9 pg (ref 26.0–34.0)
MCHC: 33.4 g/dL (ref 30.0–36.0)
MCV: 92.5 fL (ref 80.0–100.0)
Platelets: 209 10*3/uL (ref 150–400)
RBC: 4.01 MIL/uL (ref 3.87–5.11)
RDW: 13.7 % (ref 11.5–15.5)
WBC: 8.5 10*3/uL (ref 4.0–10.5)
nRBC: 0 % (ref 0.0–0.2)

## 2023-04-08 LAB — ECHOCARDIOGRAM COMPLETE
AR max vel: 2.08 cm2
AV Area VTI: 2.32 cm2
AV Area mean vel: 2.08 cm2
AV Mean grad: 5 mm[Hg]
AV Peak grad: 9.1 mm[Hg]
Ao pk vel: 1.51 m/s
Area-P 1/2: 3.02 cm2
Height: 62.5 in
MV VTI: 1.87 cm2
S' Lateral: 2.9 cm
Weight: 3248.7 [oz_av]

## 2023-04-08 LAB — GLUCOSE, CAPILLARY
Glucose-Capillary: 166 mg/dL — ABNORMAL HIGH (ref 70–99)
Glucose-Capillary: 182 mg/dL — ABNORMAL HIGH (ref 70–99)
Glucose-Capillary: 144 mg/dL — ABNORMAL HIGH (ref 70–99)
Glucose-Capillary: 257 mg/dL — ABNORMAL HIGH (ref 70–99)

## 2023-04-08 LAB — HEPARIN LEVEL (UNFRACTIONATED)
Heparin Unfractionated: 0.19 [IU]/mL — ABNORMAL LOW (ref 0.30–0.70)
Heparin Unfractionated: 0.45 [IU]/mL (ref 0.30–0.70)

## 2023-04-08 SURGERY — RENAL ANGIOGRAPHY
Anesthesia: LOCAL

## 2023-04-08 MED ORDER — SODIUM CHLORIDE 0.9% FLUSH: 3.0000 mL | INTRAVENOUS | Status: DC | PRN

## 2023-04-08 MED ORDER — SODIUM CHLORIDE 0.9 % IV SOLN: 250.0000 mL | INTRAVENOUS | Status: AC | PRN

## 2023-04-08 MED ORDER — NITROGLYCERIN 1 MG/10 ML FOR IR/CATH LAB
INTRA_ARTERIAL | Status: DC | PRN
  Administered 2023-04-08: 200 ug via INTRACORONARY

## 2023-04-08 MED ORDER — SODIUM CHLORIDE 0.9 % WEIGHT BASED INFUSION: 3.0000 mL/kg/h | INTRAVENOUS | Status: DC

## 2023-04-08 MED ORDER — VERAPAMIL HCL 2.5 MG/ML IV SOLN
INTRAVENOUS | Status: DC | PRN
  Administered 2023-04-08: 10 mL via INTRA_ARTERIAL

## 2023-04-08 MED ORDER — FENTANYL CITRATE (PF) 100 MCG/2ML IJ SOLN
INTRAMUSCULAR | Status: DC | PRN
  Administered 2023-04-08: 25 ug via INTRAVENOUS

## 2023-04-08 MED ORDER — HEPARIN SODIUM (PORCINE) 1000 UNIT/ML IJ SOLN
INTRAMUSCULAR | Status: AC
  Filled 2023-04-08: qty 10

## 2023-04-08 MED ORDER — HYDRALAZINE HCL 20 MG/ML IJ SOLN: 10.0000 mg | Freq: Once | INTRAMUSCULAR | Status: AC

## 2023-04-08 MED ORDER — CLOPIDOGREL BISULFATE 300 MG PO TABS
ORAL_TABLET | ORAL | Status: DC | PRN
  Administered 2023-04-08: 300 mg via ORAL

## 2023-04-08 MED ORDER — HYDRALAZINE HCL 20 MG/ML IJ SOLN
INTRAMUSCULAR | Status: AC
  Administered 2023-04-08: 10 mg via INTRAVENOUS
  Filled 2023-04-08: qty 1

## 2023-04-08 MED ORDER — LIDOCAINE HCL (PF) 1 % IJ SOLN
INTRAMUSCULAR | Status: DC | PRN
  Administered 2023-04-08: 3 mL

## 2023-04-08 MED ORDER — LIDOCAINE HCL (PF) 1 % IJ SOLN
INTRAMUSCULAR | Status: AC
  Filled 2023-04-08: qty 30

## 2023-04-08 MED ORDER — HEPARIN (PORCINE) IN NACL 1000-0.9 UT/500ML-% IV SOLN
INTRAVENOUS | Status: DC | PRN
  Administered 2023-04-08 (×2): 500 mL

## 2023-04-08 MED ORDER — MIDAZOLAM HCL 2 MG/2ML IJ SOLN
INTRAMUSCULAR | Status: AC
  Filled 2023-04-08: qty 2

## 2023-04-08 MED ORDER — LABETALOL HCL 5 MG/ML IV SOLN
INTRAVENOUS | Status: AC
Start: 2023-04-08 — End: 2023-04-08
  Administered 2023-04-08: 10 mg via INTRAVENOUS
  Filled 2023-04-08: qty 4

## 2023-04-08 MED ORDER — NITROGLYCERIN 1 MG/10 ML FOR IR/CATH LAB
INTRA_ARTERIAL | Status: AC
  Filled 2023-04-08: qty 10

## 2023-04-08 MED ORDER — CARVEDILOL 25 MG PO TABS
25.0000 mg | ORAL_TABLET | Freq: Two times a day (BID) | ORAL | Status: DC
  Administered 2023-04-08 – 2023-04-10 (×4): 25 mg via ORAL
  Filled 2023-04-08 (×4): qty 1

## 2023-04-08 MED ORDER — LABETALOL HCL 5 MG/ML IV SOLN
10.0000 mg | INTRAVENOUS | Status: AC | PRN
  Administered 2023-04-08 (×2): 10 mg via INTRAVENOUS
  Filled 2023-04-08: qty 4

## 2023-04-08 MED ORDER — ASPIRIN 81 MG PO CHEW: 81.0000 mg | CHEWABLE_TABLET | ORAL | Status: DC

## 2023-04-08 MED ORDER — ASPIRIN 81 MG PO CHEW
81.0000 mg | CHEWABLE_TABLET | ORAL | Status: AC
  Administered 2023-04-08: 81 mg via ORAL
  Filled 2023-04-08: qty 1

## 2023-04-08 MED ORDER — SODIUM CHLORIDE 0.9 % WEIGHT BASED INFUSION
1.0000 mL/kg/h | INTRAVENOUS | Status: DC
  Administered 2023-04-08: 1 mL/kg/h via INTRAVENOUS

## 2023-04-08 MED ORDER — FENTANYL CITRATE (PF) 100 MCG/2ML IJ SOLN
INTRAMUSCULAR | Status: AC
  Filled 2023-04-08: qty 2

## 2023-04-08 MED ORDER — CLOPIDOGREL BISULFATE 300 MG PO TABS
ORAL_TABLET | ORAL | Status: AC
  Filled 2023-04-08: qty 1

## 2023-04-08 MED ORDER — SODIUM CHLORIDE 0.9% FLUSH
3.0000 mL | Freq: Two times a day (BID) | INTRAVENOUS | Status: DC
  Administered 2023-04-09 – 2023-04-10 (×3): 3 mL via INTRAVENOUS

## 2023-04-08 MED ORDER — VERAPAMIL HCL 2.5 MG/ML IV SOLN
INTRAVENOUS | Status: AC
  Filled 2023-04-08: qty 2

## 2023-04-08 MED ORDER — SODIUM CHLORIDE 0.9 % WEIGHT BASED INFUSION: 1.0000 mL/kg/h | INTRAVENOUS | Status: DC

## 2023-04-08 MED ORDER — IOHEXOL 350 MG/ML SOLN
INTRAVENOUS | Status: DC | PRN
  Administered 2023-04-08: 230 mL

## 2023-04-08 MED ORDER — SODIUM CHLORIDE 0.9 % WEIGHT BASED INFUSION
1.0000 mL/kg/h | INTRAVENOUS | Status: AC
  Administered 2023-04-08: 1 mL/kg/h via INTRAVENOUS

## 2023-04-08 MED ORDER — HEPARIN SODIUM (PORCINE) 1000 UNIT/ML IJ SOLN
INTRAMUSCULAR | Status: DC | PRN
Start: 2023-04-08 — End: 2023-04-08
  Administered 2023-04-08: 6000 [IU] via INTRAVENOUS
  Administered 2023-04-08: 5000 [IU] via INTRAVENOUS

## 2023-04-08 MED ORDER — MIDAZOLAM HCL 2 MG/2ML IJ SOLN
INTRAMUSCULAR | Status: DC | PRN
  Administered 2023-04-08: 1 mg via INTRAVENOUS

## 2023-04-08 SURGICAL SUPPLY — 28 items
BALL SAPPHIRE NC24 2.75X22 (BALLOONS) ×2 IMPLANT
BALLN SAPPHIRE 2.5X12 (BALLOONS) ×2 IMPLANT
BALLOON SAPPHIRE 2.5X12 (BALLOONS) IMPLANT
BALLOON SAPPHIRE NC24 2.75X22 (BALLOONS) IMPLANT
CATH INFINITI 5 FR IM (CATHETERS) IMPLANT
CATH INFINITI 5FR AL1 (CATHETERS) IMPLANT
CATH INFINITI 5FR MULTPACK ANG (CATHETERS) IMPLANT
CATH TELESCOPE 6F GEC (CATHETERS) IMPLANT
CATH VISTA GUIDE 6FR JR4 (CATHETERS) ×2
CATH VISTA GUIDE 6FR JR4 ECOPK (CATHETERS) IMPLANT
DEVICE RAD COMP TR BAND LRG (VASCULAR PRODUCTS) IMPLANT
GLIDESHEATH SLEND SS 6F .021 (SHEATH) IMPLANT
GUIDEWIRE INQWIRE 1.5J.035X260 (WIRE) IMPLANT
INQWIRE 1.5J .035X260CM (WIRE) ×2 IMPLANT
KIT ENCORE 26 ADVANTAGE (KITS) IMPLANT
KIT MICROPUNCTURE NIT STIFF (SHEATH) IMPLANT
KIT PV (KITS) ×2 IMPLANT
PACK CARDIAC CATHETERIZATION (CUSTOM PROCEDURE TRAY) ×2 IMPLANT
PROTECTION STATION PRESSURIZED (MISCELLANEOUS) ×2 IMPLANT
SET ATX-X65L (MISCELLANEOUS) IMPLANT
SHEATH PINNACLE 5F 10CM (SHEATH) IMPLANT
STATION PROTECTION PRESSURIZED (MISCELLANEOUS) IMPLANT
STENT ONYX FRONTIER 2.5X22 (Permanent Stent) IMPLANT
STENT ONYX FRONTIER 2.75X38 (Permanent Stent) IMPLANT
STENT ONYX FRONTIER 3.0X15 (Permanent Stent) IMPLANT
TUBING CIL FLEX 10 FLL-RA (TUBING) IMPLANT
WIRE EMERALD 3MM-J .035X150CM (WIRE) IMPLANT
WIRE RUNTHROUGH .014X180CM (WIRE) IMPLANT

## 2023-04-08 NOTE — Interval H&P Note (Signed)
History and Physical Interval Note:  04/08/2023 2:14 PM  Leah Ferrell  has presented today for surgery, with the diagnosis of nstemi - renal artery stenosis.  The various methods of treatment have been discussed with the patient and family. After consideration of risks, benefits and other options for treatment, the patient has consented to  Procedure(s): RENAL ANGIOGRAPHY (N/A) LEFT HEART CATH AND CORS/GRAFTS ANGIOGRAPHY (N/A) as a surgical intervention.  The patient's history has been reviewed, patient examined, no change in status, stable for surgery.  I have reviewed the patient's chart and labs.  Questions were answered to the patient's satisfaction.     Lorine Bears

## 2023-04-08 NOTE — H&P (View-Only) (Signed)
Rounding Note    Patient Name: Leah Ferrell Date of Encounter: 04/08/2023  [facility] Cardiologist: Lorine Bears, MD   Subjective   Pt is most concerned with HTN. She denies any new visual changes but does report history of diabetic retinopathy. Pt denies any CP or SOB.   Inpatient Medications    Scheduled Meds:  amLODipine  10 mg Oral Daily   aspirin EC  81 mg Oral Daily   carvedilol  12.5 mg Oral BID   Chlorhexidine Gluconate Cloth  6 each Topical Q0600   clopidogrel  75 mg Oral Daily   insulin aspart  0-15 Units Subcutaneous TID WC   insulin aspart  0-5 Units Subcutaneous QHS   insulin glargine-yfgn  15 Units Subcutaneous QHS   lisinopril  20 mg Oral BID   montelukast  10 mg Oral QHS   pantoprazole  40 mg Oral Daily   rosuvastatin  40 mg Oral Daily   Continuous Infusions:  sodium chloride     heparin 1,200 Units/hr (04/08/23 0409)   nitroGLYCERIN 125 mcg/min (04/08/23 0858)   PRN Meds: acetaminophen **OR** acetaminophen, albuterol, ondansetron **OR** ondansetron (ZOFRAN) IV, polyethylene glycol   Vital Signs    Vitals:   04/08/23 0357 04/08/23 0443 04/08/23 0500 04/08/23 0810  BP:  (!) 162/62 (!) 152/62 (!) 173/71  Pulse: 72   71  Resp: 19   (!) 24  Temp:    98 F (36.7 C)  TempSrc:    Oral  SpO2: 94%   95%  Weight:      Height:        Intake/Output Summary (Last 24 hours) at 04/08/2023 0956 Last data filed at 04/08/2023 0900 Gross per 24 hour  Intake 880.59 ml  Output 2100 ml  Net -1219.41 ml      04/08/2023    3:52 AM 04/07/2023    9:27 PM 04/07/2023    3:00 AM  Last 3 Weights  Weight (lbs) 203 lb 0.7 oz 197 lb 15.6 oz 198 lb 13.7 oz  Weight (kg) 92.1 kg 89.8 kg 90.2 kg      Telemetry    NSR, HR 70's with occasional PVC's - Personally Reviewed   Physical Exam   GEN: Sitting up right in bed getting echo on exam; No acute distress.   Neck: No JVD Cardiac: RRR, no murmurs, rubs, or gallops.  Respiratory: Clear to  auscultation bilaterally. GI: Soft, nontender, non-distended  MS: No edema; No deformity. Neuro:  Nonfocal  Psych: Normal affect   Labs    High Sensitivity Troponin:   Recent Labs  Lab 04/06/23 1357 04/06/23 1553 04/06/23 2311 04/07/23 0808  TROPONINIHS 320* 355* 395* 258*     Chemistry Recent Labs  Lab 04/06/23 1357 04/07/23 0345  NA 138 136  K 4.0 3.4*  CL 99 98  CO2 31 26  GLUCOSE 141* 155*  BUN 10 15  CREATININE 0.67 0.62  CALCIUM 9.4 9.2  MG 1.9  --   PROT 7.6  --   ALBUMIN 3.7  --   AST 26  --   ALT 32  --   ALKPHOS 46  --   BILITOT 0.4  --   GFRNONAA >60 >60  ANIONGAP 8 12    Lipids  Recent Labs  Lab 04/08/23 0226  CHOL 128  TRIG 134  HDL 52  LDLCALC 49  CHOLHDL 2.5    Hematology Recent Labs  Lab 04/06/23 1357 04/07/23 0345 04/08/23 0226  WBC 8.8 9.3  8.5  RBC 4.70 4.28 4.01  HGB 14.6 13.5 12.4  HCT 44.6 39.9 37.1  MCV 94.9 93.2 92.5  MCH 31.1 31.5 30.9  MCHC 32.7 33.8 33.4  RDW 13.7 13.9 13.7  PLT 224 213 209   Thyroid No results for input(s): "TSH", "FREET4" in the last 168 hours.  BNPNo results for input(s): "BNP", "PROBNP" in the last 168 hours.  DDimer No results for input(s): "DDIMER" in the last 168 hours.   Radiology    DG Chest Port 1 View Result Date: 04/06/2023 CLINICAL DATA:  Syncope/presyncope.  Hypertension. EXAM: PORTABLE CHEST 1 VIEW COMPARISON:  07/12/2020 FINDINGS: Previous median sternotomy. Heart size remains normal. Aortic atherosclerotic calcification as seen previously. The lungs are clear. No pulmonary edema. No pleural effusion. No visible acute bone finding. IMPRESSION: No active disease. Previous median sternotomy. Aortic atherosclerotic calcification. Electronically Signed   By: Paulina Fusi M.D.   On: 04/06/2023 14:51   CT Head Wo Contrast Result Date: 04/06/2023 CLINICAL DATA:  Syncope/presyncope. Cerebrovascular cause suspected. Hypertension. EXAM: CT HEAD WITHOUT CONTRAST TECHNIQUE: Contiguous axial  images were obtained from the base of the skull through the vertex without intravenous contrast. RADIATION DOSE REDUCTION: This exam was performed according to the departmental dose-optimization program which includes automated exposure control, adjustment of the mA and/or kV according to patient size and/or use of iterative reconstruction technique. COMPARISON:  11/22/2018 FINDINGS: Brain: No acute CT finding. Advanced chronic small-vessel ischemic changes of the cerebral hemispheric white matter. No sign of acute infarction, mass lesion, hemorrhage, hydrocephalus or extra-axial collection. Vascular: There is atherosclerotic calcification of the major vessels at the base of the brain. Skull: Negative Sinuses/Orbits: Clear/normal Other: None IMPRESSION: No acute CT finding. Advanced chronic small-vessel ischemic changes of the cerebral hemispheric white matter. Electronically Signed   By: Paulina Fusi M.D.   On: 04/06/2023 14:51     Assessment & Plan   73 y.o. female with a hx of CAD (s/p CABG in 06/2020 with LIMA-LAD, SVG-LCx and SVG-Diag), PAD (s/p left common iliac artery stenting followed by bypass with left external iliac to left profundofemoral and chronically occluded bilateral SFAs), renovascular hypertension with bilateral renal stenting in 02/2019, chronic HFpEF, HTN, HLD and Type II DM who was evaluated on 04/07/2023 for elevated troponin values at the request of Dr. Wendall Stade. She was transferred from [facility] for cath and renal angiography simultaneously.   NSTEMI  -Patient initially presented to [facility], ED on 04/06/2023, for progressive dyspnea on exertion and chest pain x 3 to 4 weeks.  Further exam revealed hypertensive urgency. - hsTn 210 > 355 > 395 -Transported to [facility] for cardiac catheterization in addition to renal angiography per Dr. Kirke Corin, simultaneously today -Currently on IV heparin, IV nitro, ASA 81 mg, Plavix 75 mg, Coreg 12.5 mg twice daily and Crestor 40 mg  -  this am she is most concerned with uncontrolled HTN. Reported occasional dizziness associated. Denied CP or SOB, however, patient deviate from answering questions after repeatedly asked. Denies any new visual changes.  - Increasing Coreg to 25 mg BID in setting of elevated BP - ECHO pending (previous 05/10/20 LVEF 60-65%. G1DD, Mod LVH, elevated LAP)  - ordered BMP  Informed Consent   Shared Decision Making/Informed Consent The risks [stroke (1 in 1000), death (1 in 1000), kidney failure [usually temporary] (1 in 500), bleeding (1 in 200), allergic reaction [possibly serious] (1 in 200)], benefits (diagnostic support and management of coronary artery disease) and alternatives of a  cardiac catheterization were discussed in detail with Ms. Carl and she is willing to proceed.     CAD  s/p CABG in 06/2020 with LIMA-LAD, SVG-LCx and SVG-Diag.  HLD  - LDL this admission: 49 - Repeat cardiac catheterization as above - Continue IV heparin, IV NTG, ASA, Plavix, Coreg and Crestor - Echo pending   PAD  s/p left common iliac artery stenting followed by bypass with left external iliac to left profundofemoral and chronically occluded bilateral SFAs.  -Followed by vascular surgery  Renal Artery stenosis - S/p bilateral renal artery stenting - Per chart review, Dr. Kirke Corin plans to perform a renal angiography at same time of cardiac catheterization today.  Cath Lab already notified - She reported that previous renal artery stent improved BP in the pass. She is hoping for similar results this time.   Hypertensive urgency - BP remains elevated systolic 150-160 - Patient notes she was previously on Coreg 6.25 at home.  - currently on amlodipine 10 mg, Coreg 12.5 mg twice daily, lisinopril 20 mg BID  - Increased Coreg to 25 mg BID in setting of elevated BP  Type 2 DM  - A1C 11/2022: 6.2 -Treated with insulin.  Managed by PCP      For questions or updates, please contact [facility] Please consult [email or web address] for contact info under        Signed, Basilio Cairo, PA-C  04/08/2023, 9:56 AM

## 2023-04-08 NOTE — Progress Notes (Signed)
PROGRESS NOTE  Leah Ferrell  DOB: 11/17/1950  PCP: Junie Spencer, FNP ZOX:[id number]  DOA: 04/06/2023  LOS: 2 days  Hospital Day: 3  Brief narrative: Leah Ferrell is a 73 y.o. female with PMH significant for CAD/CABG 2022, PAD/stent/bypass, renovascular hypertension with bilateral renal stenting 2021, chronic diastolic CHF, DM2, HLD.  2/17, patient was brought to ED at [facility] from home by EMS for significant elevated blood pressure. EMS noted blood pressure of 222/68.   Apparently patient has been struggling with blood pressure control for the past few days and was double dosing her medications by self without significant change. She also reported worsening dyspnea on exertion and episode of chest pressure for past 3 to 4 weeks.  In the ED, initial blood pressure was 197/66, heart rate in 60s, breathing on room air EKG shows some subtle ST depressions troponin was elevated at 320 > 355.  Unremarkable BMP CBC. Head CT negative for acute abnormality, advanced chronic small vessel ischemic changes. Chest x-ray unremarkable.  EDP discussed with cardiologist Dr. Wyline Mood, who recommended starting heparin drip In the ED, she was also started on nitroglycerin drip and given oral antihypertensives. Admitted to [facility] Seen by cardiology. Cardiac cath was planned and hence patient was transferred to [facility]  Subjective: Patient was seen and examined this morning.  Pleasant elderly Hispanic female. Not in distress.  Pending cardiac cath today. Chart reviewed. In the last 24 hours, remains afebrile, heart rate in 60s and 70s, most recent blood pressure this morning and 170s, breathing on room air Blood work this morning with unremarkable CBC, lipid follow-up with HDL 52, LDL 49  Assessment and plan: NSTEMI Primary presented with hypertensive urgency.  Also reported 3 to 4 weeks of progressive dyspnea on exertion Troponin elevated 320 >355>395 Seen by cardiology and started  on heparin drip. Pending cardiac cath  H/o CAD S/p prior CABG 2022 HLD Continue aspirin, Plavix, Coreg, Crestor  Renal artery stenosis S/p prior bilateral renal artery stenting.   Noted plan to perform renal angiography at the time of cardiac cath   PAD S/p prior left common iliac artery stenting followed by bypass Follows up with vascular surgery  Hypertensive urgency Initial blood pressure over 220 systolic  Started on nitroglycerin drip in the ED Currently continued on carvedilol, Norvasc, lisinopril  Type 2 diabetes mellitus A1c 6.2 on October 2024 PTA meds-Tresiba 20 units nightly, NovoLog Premeal 3 times daily and Ozempic Currently on reduced dose of Tresiba at 10 units nightly along with SSI/Accu-Cheks. Recent Labs  Lab 04/07/23 1125 04/07/23 1602 04/07/23 1959 04/08/23 0650 04/08/23 1103  GLUCAP 261* 208* 278* 166* 182*   Mobility: Encourage ambulation  Goals of care   Code Status: Full Code   DVT prophylaxis: Heparin drip   Antimicrobials: None Fluid: None Consultants: Cardiology Family Communication: None at bedside  Status: Inpatient Level of care:  Progressive   Patient is from: Home Needs to continue in-hospital care: Pending cardiac cath Anticipated d/c to: Hopefully home in 1 to 2 days    Diet:  Diet Order             Diet NPO time specified Except for: Sips with Meds  Diet effective midnight                   Scheduled Meds:  amLODipine  10 mg Oral Daily   aspirin EC  81 mg Oral Daily   carvedilol  25 mg Oral BID WC  Chlorhexidine Gluconate Cloth  6 each Topical Q0600   clopidogrel  75 mg Oral Daily   insulin aspart  0-15 Units Subcutaneous TID WC   insulin aspart  0-5 Units Subcutaneous QHS   insulin glargine-yfgn  15 Units Subcutaneous QHS   lisinopril  20 mg Oral BID   montelukast  10 mg Oral QHS   pantoprazole  40 mg Oral Daily   rosuvastatin  40 mg Oral Daily    PRN meds: acetaminophen **OR** acetaminophen,  albuterol, ondansetron **OR** ondansetron (ZOFRAN) IV, polyethylene glycol   Infusions:   sodium chloride 1 mL/kg/hr (04/08/23 1057)   heparin Stopped (04/08/23 1316)   nitroGLYCERIN 125 mcg/min (04/08/23 1057)    Antimicrobials: Anti-infectives (From admission, onward)    None       Objective: Vitals:   04/08/23 0810 04/08/23 1100  BP: (!) 173/71 (!) 150/48  Pulse: 71 67  Resp: (!) 24 19  Temp: 98 F (36.7 C) 98.4 F (36.9 C)  SpO2: 95% 96%    Intake/Output Summary (Last 24 hours) at 04/08/2023 1321 Last data filed at 04/08/2023 0900 Gross per 24 hour  Intake 520.59 ml  Output 2100 ml  Net -1579.41 ml   Filed Weights   04/07/23 0300 04/07/23 2127 04/08/23 0352  Weight: 90.2 kg 89.8 kg 92.1 kg   Weight change: -0.9 kg Body mass index is 36.55 kg/m.   Physical Exam: General exam: Pleasant, elderly female Skin: No rashes, lesions or ulcers. HEENT: Atraumatic, normocephalic, no obvious bleeding Lungs: Clear to auscultation bilaterally,  CVS: S1, S2, no murmur,   GI/Abd: Soft, nontender, nondistended, bowel sound present,   CNS: Alert, awake, alert x 3 Psychiatry: Mood appropriate,  Extremities: No pedal edema, no calf tenderness,   Data Review: I have personally reviewed the laboratory data and studies available.  F/u labs  Unresulted Labs (From admission, onward)     Start     Ordered   04/09/23 0500  Heparin level (unfractionated)  Daily,   R     Question:  Specimen collection method  Answer:  Lab=Lab collect  Placed in "And" Linked Group   04/08/23 0401   04/09/23 0500  CBC  Daily,   R     Question:  Specimen collection method  Answer:  Lab=Lab collect  Placed in "And" Linked Group   04/08/23 0401            Total time spent in review of labs and imaging, patient evaluation, formulation of plan, documentation and communication with family: 55 minutes  Signed, Lorin Glass, MD [facility] 04/08/2023

## 2023-04-08 NOTE — Progress Notes (Signed)
  Echocardiogram 2D Echocardiogram has been performed.  Leah Ferrell 04/08/2023, 10:54 AM

## 2023-04-08 NOTE — Progress Notes (Signed)
Dr. Kirke Corin called and notified of pt increased BP and bleeding with 1st deflation of tr band, air replaced, see flowsheets, new order for BP med obtained, safety maintained

## 2023-04-08 NOTE — Progress Notes (Addendum)
Rounding Note    Patient Name: Leah Ferrell Date of Encounter: 04/08/2023  [facility] Cardiologist: Lorine Bears, MD   Subjective   Pt is most concerned with HTN. She denies any new visual changes but does report history of diabetic retinopathy. Pt denies any CP or SOB.   Inpatient Medications    Scheduled Meds:  amLODipine  10 mg Oral Daily   aspirin EC  81 mg Oral Daily   carvedilol  12.5 mg Oral BID   Chlorhexidine Gluconate Cloth  6 each Topical Q0600   clopidogrel  75 mg Oral Daily   insulin aspart  0-15 Units Subcutaneous TID WC   insulin aspart  0-5 Units Subcutaneous QHS   insulin glargine-yfgn  15 Units Subcutaneous QHS   lisinopril  20 mg Oral BID   montelukast  10 mg Oral QHS   pantoprazole  40 mg Oral Daily   rosuvastatin  40 mg Oral Daily   Continuous Infusions:  sodium chloride     heparin 1,200 Units/hr (04/08/23 0409)   nitroGLYCERIN 125 mcg/min (04/08/23 0858)   PRN Meds: acetaminophen **OR** acetaminophen, albuterol, ondansetron **OR** ondansetron (ZOFRAN) IV, polyethylene glycol   Vital Signs    Vitals:   04/08/23 0357 04/08/23 0443 04/08/23 0500 04/08/23 0810  BP:  (!) 162/62 (!) 152/62 (!) 173/71  Pulse: 72   71  Resp: 19   (!) 24  Temp:    98 F (36.7 C)  TempSrc:    Oral  SpO2: 94%   95%  Weight:      Height:        Intake/Output Summary (Last 24 hours) at 04/08/2023 0956 Last data filed at 04/08/2023 0900 Gross per 24 hour  Intake 880.59 ml  Output 2100 ml  Net -1219.41 ml      04/08/2023    3:52 AM 04/07/2023    9:27 PM 04/07/2023    3:00 AM  Last 3 Weights  Weight (lbs) 203 lb 0.7 oz 197 lb 15.6 oz 198 lb 13.7 oz  Weight (kg) 92.1 kg 89.8 kg 90.2 kg      Telemetry    NSR, HR 70's with occasional PVC's - Personally Reviewed   Physical Exam   GEN: Sitting up right in bed getting echo on exam; No acute distress.   Neck: No JVD Cardiac: RRR, no murmurs, rubs, or gallops.  Respiratory: Clear to  auscultation bilaterally. GI: Soft, nontender, non-distended  MS: No edema; No deformity. Neuro:  Nonfocal  Psych: Normal affect   Labs    High Sensitivity Troponin:   Recent Labs  Lab 04/06/23 1357 04/06/23 1553 04/06/23 2311 04/07/23 0808  TROPONINIHS 320* 355* 395* 258*     Chemistry Recent Labs  Lab 04/06/23 1357 04/07/23 0345  NA 138 136  K 4.0 3.4*  CL 99 98  CO2 31 26  GLUCOSE 141* 155*  BUN 10 15  CREATININE 0.67 0.62  CALCIUM 9.4 9.2  MG 1.9  --   PROT 7.6  --   ALBUMIN 3.7  --   AST 26  --   ALT 32  --   ALKPHOS 46  --   BILITOT 0.4  --   GFRNONAA >60 >60  ANIONGAP 8 12    Lipids  Recent Labs  Lab 04/08/23 0226  CHOL 128  TRIG 134  HDL 52  LDLCALC 49  CHOLHDL 2.5    Hematology Recent Labs  Lab 04/06/23 1357 04/07/23 0345 04/08/23 0226  WBC 8.8 9.3  8.5  RBC 4.70 4.28 4.01  HGB 14.6 13.5 12.4  HCT 44.6 39.9 37.1  MCV 94.9 93.2 92.5  MCH 31.1 31.5 30.9  MCHC 32.7 33.8 33.4  RDW 13.7 13.9 13.7  PLT 224 213 209   Thyroid No results for input(s): "TSH", "FREET4" in the last 168 hours.  BNPNo results for input(s): "BNP", "PROBNP" in the last 168 hours.  DDimer No results for input(s): "DDIMER" in the last 168 hours.   Radiology    DG Chest Port 1 View Result Date: 04/06/2023 CLINICAL DATA:  Syncope/presyncope.  Hypertension. EXAM: PORTABLE CHEST 1 VIEW COMPARISON:  07/12/2020 FINDINGS: Previous median sternotomy. Heart size remains normal. Aortic atherosclerotic calcification as seen previously. The lungs are clear. No pulmonary edema. No pleural effusion. No visible acute bone finding. IMPRESSION: No active disease. Previous median sternotomy. Aortic atherosclerotic calcification. Electronically Signed   By: Paulina Fusi M.D.   On: 04/06/2023 14:51   CT Head Wo Contrast Result Date: 04/06/2023 CLINICAL DATA:  Syncope/presyncope. Cerebrovascular cause suspected. Hypertension. EXAM: CT HEAD WITHOUT CONTRAST TECHNIQUE: Contiguous axial  images were obtained from the base of the skull through the vertex without intravenous contrast. RADIATION DOSE REDUCTION: This exam was performed according to the departmental dose-optimization program which includes automated exposure control, adjustment of the mA and/or kV according to patient size and/or use of iterative reconstruction technique. COMPARISON:  11/22/2018 FINDINGS: Brain: No acute CT finding. Advanced chronic small-vessel ischemic changes of the cerebral hemispheric white matter. No sign of acute infarction, mass lesion, hemorrhage, hydrocephalus or extra-axial collection. Vascular: There is atherosclerotic calcification of the major vessels at the base of the brain. Skull: Negative Sinuses/Orbits: Clear/normal Other: None IMPRESSION: No acute CT finding. Advanced chronic small-vessel ischemic changes of the cerebral hemispheric white matter. Electronically Signed   By: Paulina Fusi M.D.   On: 04/06/2023 14:51     Assessment & Plan   73 y.o. female with a hx of CAD (s/p CABG in 06/2020 with LIMA-LAD, SVG-LCx and SVG-Diag), PAD (s/p left common iliac artery stenting followed by bypass with left external iliac to left profundofemoral and chronically occluded bilateral SFAs), renovascular hypertension with bilateral renal stenting in 02/2019, chronic HFpEF, HTN, HLD and Type II DM who was evaluated on 04/07/2023 for elevated troponin values at the request of Dr. Wendall Stade. She was transferred from [facility] for cath and renal angiography simultaneously.   NSTEMI  -Patient initially presented to [facility], ED on 04/06/2023, for progressive dyspnea on exertion and chest pain x 3 to 4 weeks.  Further exam revealed hypertensive urgency. - hsTn 210 > 355 > 395 -Transported to [facility] for cardiac catheterization in addition to renal angiography per Dr. Kirke Corin, simultaneously today -Currently on IV heparin, IV nitro, ASA 81 mg, Plavix 75 mg, Coreg 12.5 mg twice daily and Crestor 40 mg  -  this am she is most concerned with uncontrolled HTN. Reported occasional dizziness associated. Denied CP or SOB, however, patient deviate from answering questions after repeatedly asked. Denies any new visual changes.  - Increasing Coreg to 25 mg BID in setting of elevated BP - ECHO pending (previous 05/10/20 LVEF 60-65%. G1DD, Mod LVH, elevated LAP)  - ordered BMP  Informed Consent   Shared Decision Making/Informed Consent The risks [stroke (1 in 1000), death (1 in 1000), kidney failure [usually temporary] (1 in 500), bleeding (1 in 200), allergic reaction [possibly serious] (1 in 200)], benefits (diagnostic support and management of coronary artery disease) and alternatives of a  cardiac catheterization were discussed in detail with Ms. Carl and she is willing to proceed.     CAD  s/p CABG in 06/2020 with LIMA-LAD, SVG-LCx and SVG-Diag.  HLD  - LDL this admission: 49 - Repeat cardiac catheterization as above - Continue IV heparin, IV NTG, ASA, Plavix, Coreg and Crestor - Echo pending   PAD  s/p left common iliac artery stenting followed by bypass with left external iliac to left profundofemoral and chronically occluded bilateral SFAs.  -Followed by vascular surgery  Renal Artery stenosis - S/p bilateral renal artery stenting - Per chart review, Dr. Kirke Corin plans to perform a renal angiography at same time of cardiac catheterization today.  Cath Lab already notified - She reported that previous renal artery stent improved BP in the pass. She is hoping for similar results this time.   Hypertensive urgency - BP remains elevated systolic 150-160 - Patient notes she was previously on Coreg 6.25 at home.  - currently on amlodipine 10 mg, Coreg 12.5 mg twice daily, lisinopril 20 mg BID  - Increased Coreg to 25 mg BID in setting of elevated BP  Type 2 DM  - A1C 11/2022: 6.2 -Treated with insulin.  Managed by PCP      For questions or updates, please contact [facility] Please consult [email or web address] for contact info under        Signed, Basilio Cairo, PA-C  04/08/2023, 9:56 AM

## 2023-04-08 NOTE — Progress Notes (Addendum)
PHARMACY - ANTICOAGULATION CONSULT NOTE  Pharmacy Consult for heparin Indication: chest pain/ACS  Labs: Recent Labs    04/06/23 1357 04/06/23 1553 04/06/23 2311 04/07/23 0345 04/07/23 0808  HGB 14.6  --   --  13.5  --   HCT 44.6  --   --  39.9  --   PLT 224  --   --  213  --   HEPARINUNFRC  --   --  0.30  --  0.37  CREATININE 0.67  --   --  0.62  --   TROPONINIHS 320* 355* 395*  --  258*   Assessment: 73yo female subtherapeutic on heparin after one level at goal (level 0.19, not appearing in Epic, resulted on pharmacy report from d/c'd lab order); no infusion issues or signs of bleeding per RN.  Goal of Therapy:  Heparin level 0.3-0.7 units/ml   Plan:  Increase heparin infusion by 2-3 units/kg/hr to 1200 units/hr. Check level in 6 hours.   Vernard Gambles, PharmD, BCPS 04/08/2023 4:01 AM

## 2023-04-08 NOTE — TOC Progression Note (Signed)
Transition of Care [facility]) - Progression Note    Patient Details  Name: Leah Ferrell MRN: [id number] Date of Birth: 1951-02-04  [facility] of [facility] CM/SW Contact  Leone Haven, RN Phone Number: 04/08/2023, 12:38 PM  Clinical Narrative:    From home alone, has PCP and insurance on file, states has no HH services in place at this time, has walker and cane at home.   States daughter  will transport them home at [employer] and family is support system, states gets medications from [facility] in [place].  Pta self ambulatory.      Barriers to Discharge: Continued Medical Work up  Expected Discharge Plan and Services                                               Social Determinants of Health (SDOH) Interventions SDOH Screenings   Food Insecurity: No Food Insecurity (04/06/2023)  Housing: Low Risk  (04/06/2023)  Transportation Needs: No Transportation Needs (04/06/2023)  Utilities: Not At Risk (04/06/2023)  Alcohol Screen: Low Risk  (12/25/2022)  Depression (PHQ2-9): Low Risk  (12/25/2022)  Financial Resource Strain: Low Risk  (12/25/2022)  Physical Activity: Insufficiently Active (12/25/2022)  Social Connections: Moderately Isolated (04/06/2023)  Stress: No Stress Concern Present (12/25/2022)  Tobacco Use: Medium Risk (04/06/2023)  Health Literacy: Adequate Health Literacy (12/25/2022)    Readmission Risk Interventions     No data to display

## 2023-04-08 NOTE — Progress Notes (Signed)
PHARMACY - ANTICOAGULATION CONSULT NOTE  Pharmacy Consult for heparin Indication: chest pain/ACS  Allergies  Allergen Reactions   Tape Rash    Patient Measurements: Height: 5' 2.5" (158.8 cm) Weight: 92.1 kg (203 lb 0.7 oz) IBW/kg (Calculated) : 51.25 Heparin Dosing Weight: 71 kg  Vital Signs: Temp: 98 F (36.7 C) (02/19 0810) Temp Source: Oral (02/19 0810) BP: 173/71 (02/19 0810) Pulse Rate: 71 (02/19 0810)  Labs: Recent Labs    04/06/23 1357 04/06/23 1357 04/06/23 1553 04/06/23 2311 04/07/23 0345 04/07/23 0808 04/08/23 0226 04/08/23 1000  HGB 14.6  --   --   --  13.5  --  12.4  --   HCT 44.6  --   --   --  39.9  --  37.1  --   PLT 224  --   --   --  213  --  209  --   HEPARINUNFRC  --    < >  --  0.30  --  0.37 0.19* 0.45  CREATININE 0.67  --   --   --  0.62  --   --   --   TROPONINIHS 320*  --  355* 395*  --  258*  --   --    < > = values in this interval not displayed.    Estimated Creatinine Clearance: 68.8 mL/min (by C-G formula based on SCr of 0.62 mg/dL).  Assessment: Pharmacy consulted to dose heparin in patient with chest pain/ACS.  Patient is not on anticoagulation prior to admission. CBC WNL; Trop 395  Heparin level came back therapeutic at 0.45, on 1200 units/hr. Hgb 12.4, plt 209. No s/sx of bleeding or infusion issues.   Goal of Therapy:  Heparin level 0.3-0.7 units/ml Monitor platelets by anticoagulation protocol: Yes   Plan:  Continue heparin infusion at 1200 units/hr  Check anti-Xa level daily while on heparin Continue to monitor H&H and platelets F/u after cath 2/19  Thank you for allowing pharmacy to participate in this patient's care,  Sherron Monday, PharmD, BCCCP Clinical Pharmacist  Phone: [phone] 04/08/2023 11:06 AM  Please check AMION for all [facility] Pharmacy phone numbers After 10:00 PM, call Main Pharmacy [phone]

## 2023-04-09 ENCOUNTER — Other Ambulatory Visit (HOSPITAL_COMMUNITY): Payer: Self-pay

## 2023-04-09 ENCOUNTER — Telehealth (HOSPITAL_COMMUNITY): Payer: Self-pay | Admitting: Pharmacy Technician

## 2023-04-09 ENCOUNTER — Encounter (HOSPITAL_COMMUNITY): Payer: Self-pay | Admitting: Cardiovascular Disease

## 2023-04-09 DIAGNOSIS — Z955 Presence of coronary angioplasty implant and graft: Secondary | ICD-10-CM

## 2023-04-09 DIAGNOSIS — I214 Non-ST elevation (NSTEMI) myocardial infarction: Secondary | ICD-10-CM | POA: Diagnosis not present

## 2023-04-09 DIAGNOSIS — I160 Hypertensive urgency: Secondary | ICD-10-CM | POA: Diagnosis not present

## 2023-04-09 LAB — BASIC METABOLIC PANEL
Anion gap: 8 (ref 5–15)
BUN: 10 mg/dL (ref 8–23)
CO2: 25 mmol/L (ref 22–32)
Calcium: 8.8 mg/dL — ABNORMAL LOW (ref 8.9–10.3)
Chloride: 104 mmol/L (ref 98–111)
Creatinine, Ser: 0.7 mg/dL (ref 0.44–1.00)
GFR, Estimated: >60 mL/min (ref >60)
Glucose, Bld: 153 mg/dL — ABNORMAL HIGH (ref 70–99)
Potassium: 4.1 mmol/L (ref 3.5–5.1)
Sodium: 137 mmol/L (ref 135–145)

## 2023-04-09 LAB — GLUCOSE, CAPILLARY
Glucose-Capillary: 148 mg/dL — ABNORMAL HIGH (ref 70–99)
Glucose-Capillary: 197 mg/dL — ABNORMAL HIGH (ref 70–99)
Glucose-Capillary: 181 mg/dL — ABNORMAL HIGH (ref 70–99)
Glucose-Capillary: 250 mg/dL — ABNORMAL HIGH (ref 70–99)

## 2023-04-09 LAB — CBC
HCT: 36.8 % (ref 36.0–46.0)
Hemoglobin: 12.3 g/dL (ref 12.0–15.0)
MCH: 31 pg (ref 26.0–34.0)
MCHC: 33.4 g/dL (ref 30.0–36.0)
MCV: 92.7 fL (ref 80.0–100.0)
Platelets: 209 10*3/uL (ref 150–400)
RBC: 3.97 MIL/uL (ref 3.87–5.11)
RDW: 14 % (ref 11.5–15.5)
WBC: 8.2 10*3/uL (ref 4.0–10.5)
nRBC: 0 % (ref 0.0–0.2)

## 2023-04-09 NOTE — Discharge Instructions (Signed)
 Information about your medication: Plavix (anti-platelet agent)  Generic Name (Brand): clopidogrel (Plavix), once daily medication  PURPOSE: You are taking this medication along with aspirin to lower your chance of having a heart attack, stroke, or blood clots in your heart stent. These can be fatal. Plavix and aspirin help prevent platelets from sticking together and forming a clot that can block an artery or your stent.   Common SIDE EFFECTS you may experience include: bruising or bleeding more easily, shortness of breath  Do not stop taking PLAVIX without talking to the doctor who prescribes it for you. People who are treated with a stent and stop taking Plavix too soon, have a higher risk of getting a blood clot in the stent, having a heart attack, or dying. If you stop Plavix because of bleeding, or for other reasons, your risk of a heart attack or stroke may increase.   Avoid taking NSAID agents or anti-inflammatory medications such as ibuprofen, naproxen given increased bleed risk with plavix - can use acetaminophen (Tylenol) if needed for pain.  Avoid taking over the counter stomach medications omeprazole (Prilosec) or esomeprazole (Nexium) since these do interact and make plavix less effective - ask your pharmacist or doctor for alterative agents if needed for heartburn or GERD.   Tell all of your doctors and dentists that you are taking Plavix. They should talk to the doctor who prescribed Plavix for you before you have any surgery or invasive procedure.   Contact your health care provider if you experience: severe or uncontrollable bleeding, pink/red/brown urine, vomiting blood or vomit that looks like "coffee grounds", red or black stools (looks like tar), coughing up blood or blood clots ----------------------------------------------------------------------------------------------------------------------

## 2023-04-09 NOTE — Plan of Care (Signed)
   Problem: Activity: Goal: Risk for activity intolerance will decrease Outcome: Progressing

## 2023-04-09 NOTE — Care Management Important Message (Signed)
Important Message  Patient Details  Name: Leah Ferrell MRN: [id number] Date of Birth: 1950/08/20   Important Message Given:  Yes - Medicare IM     Renie Ora 04/09/2023, 11:09 AM

## 2023-04-09 NOTE — Telephone Encounter (Signed)
 Patient Product/process development scientist completed.    The patient is insured through [facility] ([facility] Patient has Medicare and is not eligible for a copay card, but may be able to apply for patient assistance or Medicare RX Payment Plan (Patient Must reach out to their plan, if eligible for payment plan), if available.    Ran test claim for Brilinta 90 mg and the current 30 day co-pay is $387.00 due to a $340.00 deductible.  Will be $47.00 once deductible is met.   This test claim was processed through [facility]- copay amounts may vary at other pharmacies due to pharmacy/plan contracts, or as the patient moves through the different stages of their insurance plan.     Roland Earl, CPHT Pharmacy Technician III Certified Patient Advocate [facility] Pharmacy Patient Advocate Team Direct Number: [phone]  Fax: [phone]

## 2023-04-09 NOTE — Progress Notes (Signed)
PROGRESS NOTE  OPHA MCGHEE  DOB: 1950-06-01  PCP: Junie Spencer, FNP EXB:[id number]  DOA: 04/06/2023  LOS: 3 days  Hospital Day: 4  Brief narrative: Leah Ferrell is a 73 y.o. female with PMH significant for CAD/CABG 2022, PAD/stent/bypass, renovascular hypertension with bilateral renal stenting 2021, chronic diastolic CHF, DM2, HLD.  2/17, patient was brought to ED at [facility] from home by EMS for significant elevated blood pressure. EMS noted blood pressure of 222/68.   Apparently patient has been struggling with blood pressure control for the past few days and was double dosing her medications by self without significant change. She also reported worsening dyspnea on exertion and episode of chest pressure for past 3 to 4 weeks.  In the ED, initial blood pressure was 197/66, heart rate in 60s, breathing on room air EKG shows some subtle ST depressions troponin was elevated at 320 > 355.  Unremarkable BMP CBC. Head CT negative for acute abnormality, advanced chronic small vessel ischemic changes. Chest x-ray unremarkable.  EDP discussed with cardiologist Dr. Wyline Mood, who recommended starting heparin drip In the ED, she was also started on nitroglycerin drip and given oral antihypertensives. Admitted to [facility] Seen by cardiology. Cardiac cath was planned and hence patient was transferred to [facility].  Subjective: Patient was seen and examined this morning.   Pleasant elderly Hispanic female. Sitting up at the edge of the bed.  Underwent cardiac cath yesterday.  3 stents placed. Cleared by cardiology for discharge today but daughter unable to drive today because of snow.  Assessment and plan: NSTEMI s/p 3 stents -2/19 H/o CAD S/p prior CABG 2022 HLD Primary presented with hypertensive urgency.  Also reported 3 to 4 weeks of progressive dyspnea on exertion Troponin elevated 320 >355>395 Seen by cardiology and started on heparin drip. Underwent cardiac cath  yesterday.  3 stents placed. Continue aspirin, Plavix, Coreg, Crestor  Renal artery stenosis S/p prior bilateral renal artery stenting.   Renal angiography performed with cardiac cath yesterday 2/19.  Left renal stent patent.  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   PAD S/p prior left common iliac artery stenting followed by bypass Follows up with vascular surgery  Hypertensive urgency Initial blood pressure over 220 systolic  Started on nitroglycerin drip in the ED Currently continued on carvedilol, Norvasc, lisinopril BP improved with improvement in dose of Coreg.  Type 2 diabetes mellitus A1c 6.2 on October 2024 PTA meds-Tresiba 20 units nightly, NovoLog Premeal 3 times daily and Ozempic Currently on reduced dose of Tresiba at 10 units nightly along with SSI/Accu-Cheks. Recent Labs  Lab 04/08/23 0650 04/08/23 1103 04/08/23 1845 04/08/23 2115 04/09/23 0748  GLUCAP 166* 182* 144* 257* 148*   Mobility: Encourage ambulation  Goals of care   Code Status: Full Code   DVT prophylaxis: Heparin drip   Antimicrobials: None Fluid: None Consultants: Cardiology Family Communication: None at bedside  Status: Inpatient Level of care:  Progressive Cardiac   Patient is from: Home Needs to continue in-hospital care: Stable for discharge Anticipated d/c to: Hopefully home tomorrow.  Daughter unable to drive her home today because of snow   Diet:  Diet Order             Diet Heart Room service appropriate? Yes; Fluid consistency: Thin  Diet effective now                   Scheduled Meds:  amLODipine  10 mg Oral Daily  aspirin EC  81 mg Oral Daily   carvedilol  25 mg Oral BID WC   clopidogrel  75 mg Oral Daily   insulin aspart  0-15 Units Subcutaneous TID WC   insulin aspart  0-5 Units Subcutaneous QHS   insulin glargine-yfgn  15 Units Subcutaneous QHS   lisinopril  20 mg Oral BID   montelukast  10 mg Oral QHS   pantoprazole  40  mg Oral Daily   rosuvastatin  40 mg Oral Daily   sodium chloride flush  3 mL Intravenous Q12H    PRN meds: sodium chloride, acetaminophen **OR** acetaminophen, albuterol, ondansetron **OR** ondansetron (ZOFRAN) IV, polyethylene glycol, sodium chloride flush   Infusions:   sodium chloride     nitroGLYCERIN Stopped (04/08/23 1415)    Antimicrobials: Anti-infectives (From admission, onward)    None       Objective: Vitals:   04/09/23 0358 04/09/23 0752  BP: (!) 153/54 (!) 154/49  Pulse: 74 68  Resp: 16 17  Temp: 98.6 F (37 C) 97.9 F (36.6 C)  SpO2: 93% 97%    Intake/Output Summary (Last 24 hours) at 04/09/2023 1052 Last data filed at 04/09/2023 0630 Gross per 24 hour  Intake 704.43 ml  Output 400 ml  Net 304.43 ml   Filed Weights   04/07/23 0300 04/07/23 2127 04/08/23 0352  Weight: 90.2 kg 89.8 kg 92.1 kg   Weight change:  Body mass index is 36.55 kg/m.   Physical Exam: General exam: Pleasant, elderly female Skin: No rashes, lesions or ulcers. HEENT: Atraumatic, normocephalic, no obvious bleeding Lungs: Clear to auscultation bilaterally,  CVS: S1, S2, no murmur,   GI/Abd: Soft, nontender, nondistended, bowel sound present,   CNS: Alert, awake, alert x 3 Psychiatry: Mood appropriate,  Extremities: No pedal edema, no calf tenderness,   Data Review: I have personally reviewed the laboratory data and studies available.  F/u labs  Unresulted Labs (From admission, onward)     Start     Ordered   04/09/23 0500  CBC  Daily,   R     Question:  Specimen collection method  Answer:  Lab=Lab collect  Placed in "And" Linked Group   04/08/23 0401   04/09/23 0500  Lipoprotein A (LPA)  Tomorrow morning,   R       Question:  Specimen collection method  Answer:  Lab=Lab collect   04/08/23 1935            Total time spent in review of labs and imaging, patient evaluation, formulation of plan, documentation and communication with family: 45  minutes  Signed, Lorin Glass, MD [facility] 04/09/2023

## 2023-04-09 NOTE — Progress Notes (Addendum)
CSW received consult for patient. CSW spoke with patient and offered [employer]. Patient accepted. All questions answered.Patient reports her daughter plans on picking her up tomorrow if medically ready. CSW informed MD. No further questions reported at this time.

## 2023-04-09 NOTE — Progress Notes (Addendum)
Rounding Note    Patient Name: Leah Ferrell Date of Encounter: 04/09/2023  [facility] Cardiologist: Lorine Bears, MD   Subjective   Patient denies any complaints this am. She stated, "Im in good hand with Dr. Kirke Corin and will follow up for him to follow his plan."  Inpatient Medications    Scheduled Meds:  amLODipine  10 mg Oral Daily   aspirin EC  81 mg Oral Daily   carvedilol  25 mg Oral BID WC   clopidogrel  75 mg Oral Daily   insulin aspart  0-15 Units Subcutaneous TID WC   insulin aspart  0-5 Units Subcutaneous QHS   insulin glargine-yfgn  15 Units Subcutaneous QHS   lisinopril  20 mg Oral BID   montelukast  10 mg Oral QHS   pantoprazole  40 mg Oral Daily   rosuvastatin  40 mg Oral Daily   sodium chloride flush  3 mL Intravenous Q12H   Continuous Infusions:  sodium chloride     nitroGLYCERIN Stopped (04/08/23 1415)   PRN Meds: sodium chloride, acetaminophen **OR** acetaminophen, albuterol, ondansetron **OR** ondansetron (ZOFRAN) IV, polyethylene glycol, sodium chloride flush   Vital Signs    Vitals:   04/08/23 2304 04/09/23 0004 04/09/23 0104 04/09/23 0358  BP: (!) 172/61 (!) 143/50 (!) 138/49 (!) 153/54  Pulse: 72 70 71 74  Resp:    16  Temp:    98.6 F (37 C)  TempSrc:    Oral  SpO2: 95% 94% 92% 93%  Weight:      Height:        Intake/Output Summary (Last 24 hours) at 04/09/2023 0624 Last data filed at 04/08/2023 1800 Gross per 24 hour  Intake 50 ml  Output 900 ml  Net -850 ml      04/08/2023    3:52 AM 04/07/2023    9:27 PM 04/07/2023    3:00 AM  Last 3 Weights  Weight (lbs) 203 lb 0.7 oz 197 lb 15.6 oz 198 lb 13.7 oz  Weight (kg) 92.1 kg 89.8 kg 90.2 kg      Telemetry    NSR, HR 70's  - Personally Reviewed  ECG    NSR with HR 77, nonspecific ST changes- Personally Reviewed  Physical Exam   GEN: Laying in bed without any acute distress.   Neck: No JVD Cardiac: RRR, no murmurs, rubs, or gallops: +2 radial pulse   Respiratory: Clear to auscultation bilaterally. GI: Soft, nontender, non-distended  MS: No edema; No deformity; R radial access site without any hematoma or irregularities Neuro:  Nonfocal  Psych: Normal affect   Labs    High Sensitivity Troponin:   Recent Labs  Lab 04/06/23 1357 04/06/23 1553 04/06/23 2311 04/07/23 0808  TROPONINIHS 320* 355* 395* 258*     Chemistry Recent Labs  Lab 04/06/23 1357 04/07/23 0345 04/08/23 0226 04/09/23 0314  NA 138 136 133* 137  K 4.0 3.4* 4.0 4.1  CL 99 98 98 104  CO2 31 26 24 25   GLUCOSE 141* 155* 156* 153*  BUN 10 15 16 10   CREATININE 0.67 0.62 0.83 0.70  CALCIUM 9.4 9.2 9.0 8.8*  MG 1.9  --   --   --   PROT 7.6  --   --   --   ALBUMIN 3.7  --   --   --   AST 26  --   --   --   ALT 32  --   --   --  ALKPHOS 46  --   --   --   BILITOT 0.4  --   --   --   GFRNONAA >60 >60 >60 >60  ANIONGAP 8 12 11 8     Lipids  Recent Labs  Lab 04/08/23 0226  CHOL 128  TRIG 134  HDL 52  LDLCALC 49  CHOLHDL 2.5    Hematology Recent Labs  Lab 04/07/23 0345 04/08/23 0226 04/09/23 0314  WBC 9.3 8.5 8.2  RBC 4.28 4.01 3.97  HGB 13.5 12.4 12.3  HCT 39.9 37.1 36.8  MCV 93.2 92.5 92.7  MCH 31.5 30.9 31.0  MCHC 33.8 33.4 33.4  RDW 13.9 13.7 14.0  PLT 213 209 209   Thyroid No results for input(s): "TSH", "FREET4" in the last 168 hours.  BNPNo results for input(s): "BNP", "PROBNP" in the last 168 hours.  DDimer No results for input(s): "DDIMER" in the last 168 hours.   Radiology    CARDIAC CATHETERIZATION Result Date: 04/08/2023   Mid LAD lesion is 30% stenosed.   Prox RCA lesion is 40% stenosed.   2nd Diag lesion is 95% stenosed.   Ost LAD to Prox LAD lesion is 95% stenosed.   Ost Cx to Prox Cx lesion is 100% stenosed.   Dist RCA lesion is 95% stenosed with 95% stenosed side branch in RPDA.   Mid RCA to Dist RCA lesion is 80% stenosed.   Prox RCA to Mid RCA lesion is 70% stenosed.   Mid Cx lesion is 100% stenosed.   A drug-eluting  stent was successfully placed using a STENT ONYX FRONTIER 2.5X22.   A drug-eluting stent was successfully placed using a STENT ONYX FRONTIER [id number].   A drug-eluting stent was successfully placed using a STENT ONYX FRONTIER 3.0X15.   Balloon angioplasty was performed using a BALLN SAPPHIRE 2.5X12.   Post intervention, there is a 20% residual stenosis.   Post intervention, there is a 10% residual stenosis.   Post intervention, there is a 0% residual stenosis.   Post intervention, the side branch was reduced to 0% residual stenosis.   LIMA graft was visualized by angiography and is normal in caliber.   SVG and is normal in caliber.   SVG and is normal in caliber.   The graft exhibits no disease.   The graft exhibits minimal luminal irregularities.   The graft exhibits no disease.   The left ventricular systolic function is normal.   LV end diastolic pressure is mildly elevated.   The left ventricular ejection fraction is 55-65% by visual estimate. 1.  Heavily calcified coronary arteries with severe underlying three-vessel coronary artery disease.  Patent grafts including LIMA to LAD, SVG to diagonal and SVG to OM 3.  Severe progression of native coronary artery disease including the right coronary artery which was not bypassed. 2.  Normal LV systolic function and mildly elevated left ventricular end-diastolic pressure. 3.  Widely patent left renal artery stent with no significant restenosis.  Patent right renal artery stent with significant in-stent restenosis. 4.  Very difficult but successful PCI and 3 overlapped drug-eluting stent placement to the right coronary artery.  Very difficult procedure due to diffuse and calcified disease.  I had to accept 20% residual stenosis given inability to fully post dilate the stent with a high-pressure noncompliant balloon. Recommendations: Continue dual antiplatelet therapy indefinitely as tolerated. Continue aggressive treatment of risk factors. Right renal artery in-stent  restenosis can be treated as an outpatient if blood pressure continues to be uncontrolled  in spite of 3 antihypertensive medications.   PERIPHERAL VASCULAR CATHETERIZATION Result Date: 04/08/2023   Mid LAD lesion is 30% stenosed.   Prox RCA lesion is 40% stenosed.   2nd Diag lesion is 95% stenosed.   Ost LAD to Prox LAD lesion is 95% stenosed.   Ost Cx to Prox Cx lesion is 100% stenosed.   Dist RCA lesion is 95% stenosed with 95% stenosed side branch in RPDA.   Mid RCA to Dist RCA lesion is 80% stenosed.   Prox RCA to Mid RCA lesion is 70% stenosed.   Mid Cx lesion is 100% stenosed.   A drug-eluting stent was successfully placed using a STENT ONYX FRONTIER 2.5X22.   A drug-eluting stent was successfully placed using a STENT ONYX FRONTIER [id number].   A drug-eluting stent was successfully placed using a STENT ONYX FRONTIER 3.0X15.   Balloon angioplasty was performed using a BALLN SAPPHIRE 2.5X12.   Post intervention, there is a 20% residual stenosis.   Post intervention, there is a 10% residual stenosis.   Post intervention, there is a 0% residual stenosis.   Post intervention, the side branch was reduced to 0% residual stenosis.   LIMA graft was visualized by angiography and is normal in caliber.   SVG and is normal in caliber.   SVG and is normal in caliber.   The graft exhibits no disease.   The graft exhibits minimal luminal irregularities.   The graft exhibits no disease.   The left ventricular systolic function is normal.   LV end diastolic pressure is mildly elevated.   The left ventricular ejection fraction is 55-65% by visual estimate. 1.  Heavily calcified coronary arteries with severe underlying three-vessel coronary artery disease.  Patent grafts including LIMA to LAD, SVG to diagonal and SVG to OM 3.  Severe progression of native coronary artery disease including the right coronary artery which was not bypassed. 2.  Normal LV systolic function and mildly elevated left ventricular end-diastolic pressure.  3.  Widely patent left renal artery stent with no significant restenosis.  Patent right renal artery stent with significant in-stent restenosis. 4.  Very difficult but successful PCI and 3 overlapped drug-eluting stent placement to the right coronary artery.  Very difficult procedure due to diffuse and calcified disease.  I had to accept 20% residual stenosis given inability to fully post dilate the stent with a high-pressure noncompliant balloon. Recommendations: Continue dual antiplatelet therapy indefinitely as tolerated. Continue aggressive treatment of risk factors. Right renal artery in-stent restenosis can be treated as an outpatient if blood pressure continues to be uncontrolled in spite of 3 antihypertensive medications.   ECHOCARDIOGRAM COMPLETE Result Date: 04/08/2023    ECHOCARDIOGRAM REPORT   Patient Name:   INDY PRESTWOOD Date of Exam: 04/08/2023 Medical Rec #:  [id number]         Height:       62.5 in Accession #:    [id number]        Weight:       203.0 lb Date of Birth:  [birth year]          BSA:          1.936 m Patient Age:    72 years          BP:           152/62 mmHg Patient Gender: F                 HR:  66 bpm. Exam Location:  Inpatient Procedure: 2D Echo, 3D Echo, Cardiac Doppler, Color Doppler and Strain Analysis            (Both Spectral and Color Flow Doppler were utilized during            procedure). Indications:    NSTEMI  History:        Patient has prior history of Echocardiogram examinations, most                 recent 05/10/2020. CAD, Prior CABG; Risk Factors:Hypertension,                 Diabetes and Former Smoker.  Sonographer:    Karma Ganja Referring Phys: [phone] CARLOS MADERA  Sonographer Comments: Global longitudinal strain was attempted. IMPRESSIONS  1. Left ventricular ejection fraction by 3D volume is 53 %. The left ventricle has low normal function. The left ventricle has no regional wall motion abnormalities. Left ventricular diastolic parameters are consistent  with Grade I diastolic dysfunction  (impaired relaxation). The average left ventricular global longitudinal strain is -10.6 %. The global longitudinal strain is abnormal.  2. Right ventricular systolic function is mildly reduced. The right ventricular size is normal.  3. Left atrial size was moderately dilated.  4. The mitral valve is normal in structure. No evidence of mitral valve regurgitation. No evidence of mitral stenosis.  5. The aortic valve is normal in structure. Aortic valve regurgitation is not visualized. No aortic stenosis is present.  6. The inferior vena cava is normal in size with greater than 50% respiratory variability, suggesting right atrial pressure of 3 mmHg. FINDINGS  Left Ventricle: Left ventricular ejection fraction by 3D volume is 53 %. The left ventricle has low normal function. The left ventricle has no regional wall motion abnormalities. The average left ventricular global longitudinal strain is -10.6 %. Strain  was performed and the global longitudinal strain is abnormal. The left ventricular internal cavity size was normal in size. There is no left ventricular hypertrophy. Left ventricular diastolic parameters are consistent with Grade I diastolic dysfunction  (impaired relaxation). Right Ventricle: The right ventricular size is normal. No increase in right ventricular wall thickness. Right ventricular systolic function is mildly reduced. Left Atrium: Left atrial size was moderately dilated. Right Atrium: Right atrial size was normal in size. Pericardium: There is no evidence of pericardial effusion. Mitral Valve: The mitral valve is normal in structure. Mild mitral annular calcification. No evidence of mitral valve regurgitation. No evidence of mitral valve stenosis. MV peak gradient, 8.1 mmHg. The mean mitral valve gradient is 4.0 mmHg. Tricuspid Valve: The tricuspid valve is normal in structure. Tricuspid valve regurgitation is not demonstrated. No evidence of tricuspid stenosis.  Aortic Valve: The aortic valve is normal in structure. Aortic valve regurgitation is not visualized. No aortic stenosis is present. Aortic valve mean gradient measures 5.0 mmHg. Aortic valve peak gradient measures 9.1 mmHg. Aortic valve area, by VTI measures 2.32 cm. Pulmonic Valve: The pulmonic valve was normal in structure. Pulmonic valve regurgitation is not visualized. No evidence of pulmonic stenosis. Aorta: The aortic root is normal in size and structure. Venous: The inferior vena cava is normal in size with greater than 50% respiratory variability, suggesting right atrial pressure of 3 mmHg. IAS/Shunts: No atrial level shunt detected by color flow Doppler. Additional Comments: 3D was performed not requiring image post processing on an independent workstation and was abnormal.  LEFT VENTRICLE PLAX 2D LVIDd:  4.40 cm         Diastology LVIDs:         2.90 cm         LV e' medial:    4.46 cm/s LV PW:         1.40 cm         LV E/e' medial:  30.0 LV IVS:        1.30 cm         LV e' lateral:   5.44 cm/s LVOT diam:     2.00 cm         LV E/e' lateral: 24.6 LV SV:         83 LV SV Index:   43              2D LVOT Area:     3.14 cm        Longitudinal                                Strain                                2D Strain GLS  -10.6 %                                Avg:                                 3D Volume EF                                LV 3D EF:    Left                                             ventricul                                             ar                                             ejection                                             fraction                                             by 3D  volume is                                             53 %.                                 3D Volume EF:                                3D EF:        53 %                                LV EDV:       123 ml                                LV ESV:        58 ml                                LV SV:        65 ml RIGHT VENTRICLE            IVC RV Basal diam:  3.90 cm    IVC diam: 1.80 cm RV S prime:     7.94 cm/s TAPSE (M-mode): 1.3 cm LEFT ATRIUM              Index        RIGHT ATRIUM           Index LA diam:        4.40 cm  2.27 cm/m   RA Area:     15.70 cm LA Vol (A2C):   110.0 ml 56.83 ml/m  RA Volume:   39.60 ml  20.46 ml/m LA Vol (A4C):   83.4 ml  43.09 ml/m LA Biplane Vol: 96.5 ml  49.86 ml/m  AORTIC VALVE AV Area (Vmax):    2.08 cm AV Area (Vmean):   2.08 cm AV Area (VTI):     2.32 cm AV Vmax:           151.00 cm/s AV Vmean:          103.000 cm/s AV VTI:            0.357 m AV Peak Grad:      9.1 mmHg AV Mean Grad:      5.0 mmHg LVOT Vmax:         100.00 cm/s LVOT Vmean:        68.300 cm/s LVOT VTI:          0.264 m LVOT/AV VTI ratio: 0.74  AORTA Ao Root diam: 2.90 cm MITRAL VALVE MV Area (PHT): 3.02 cm     SHUNTS MV Area VTI:   1.87 cm     Systemic VTI:  0.26 m MV Peak grad:  8.1 mmHg     Systemic Diam: 2.00 cm MV Mean grad:  4.0 mmHg MV Vmax:       1.42 m/s MV Vmean:      91.2 cm/s MV Decel Time: 251 msec MV E velocity: 134.00 cm/s  MV A velocity: 122.00 cm/s MV E/A ratio:  1.10 Donato Schultz MD Electronically signed by Donato Schultz MD Signature Date/Time: 04/08/2023/1:58:50 PM    Final     Cardiac Studies   LCA 04/08/23 Diagnostic Dominance: Right Left Main  There is mild diffuse disease throughout the vessel.    Left Anterior Descending  Ost LAD to Prox LAD lesion is 95% stenosed. The lesion is severely calcified. The lesion was previously treated .  Mid LAD lesion is 30% stenosed. The lesion was previously treated .    Second Diagonal Branch  Vessel is large in size.  2nd Diag lesion is 95% stenosed.    Third Diagonal Branch  Vessel is small in size. The vessel exhibits minimal luminal irregularities.    Left Circumflex  Ost Cx to Prox Cx lesion is 100% stenosed. The lesion is severely calcified.  Mid Cx lesion is 100%  stenosed.    First Obtuse Marginal Branch  The vessel exhibits minimal luminal irregularities.    Second Obtuse Marginal Branch  The vessel exhibits minimal luminal irregularities.    Third Obtuse Marginal Branch  There is mild disease in the vessel.    Right Coronary Artery  Prox RCA lesion is 40% stenosed. The lesion was previously treated .  Prox RCA to Mid RCA lesion is 70% stenosed. The lesion is moderately calcified.  Mid RCA to Dist RCA lesion is 80% stenosed. The lesion is type C. The lesion is severely calcified. The lesion was not previously treated .  Dist RCA lesion is 95% stenosed with 95% stenosed side branch in RPDA. The lesion is type C. The lesion is severely calcified.    Right Posterior Descending Artery  The vessel exhibits minimal luminal irregularities.    Right Posterior Atrioventricular Artery  Vessel is small in size.    LIMA LIMA Graft To Dist LAD  LIMA graft was visualized by angiography and is normal in caliber. The graft exhibits no disease.    Saphenous Graft To 3rd Mrg  SVG and is normal in caliber. The graft exhibits minimal luminal irregularities.    Saphenous Graft To 2nd Diag  SVG and is normal in caliber. The graft exhibits no disease.    Diagnostic Dominance: Right  Intervention   Renal angiography 04/08/23: Findings: Widely patent left renal artery stent with no significant restenosis. Patent right renal artery stent with 80% restenosis.    Assessment & Plan  73 y.o. female with a hx of CAD (s/p CABG in 06/2020 with LIMA-LAD, SVG-LCx and SVG-Diag), PAD (s/p left common iliac artery stenting followed by bypass with left external iliac to left profundofemoral and chronically occluded bilateral SFAs), renovascular hypertension with bilateral renal stenting in 02/2019, chronic HFpEF, HTN, HLD and Type II DM who was evaluated on 04/07/2023 for elevated troponin values at the request of Dr. Wendall Stade. She was transferred from [facility] for  cath and renal angiography simultaneously.    NSTEMI  -Patient initially presented to [facility], ED on 04/06/2023, for progressive dyspnea on exertion and chest pain x 3 to 4 weeks.  Further exam revealed hypertensive urgency. - hsTn 210 > 355 > 395 -Transported to [facility] for cardiac catheterization in addition to renal angiography per Dr. Kirke Corin, simultaneously on 04/08/23 - Labs unremarkable this am except glucose153 - ECHO 04/08/23: LVEF 53. LV  low normal function. No RWMA. G1 DD. Avg. LV global longitudinal abnormal -10.6%. RV systolic function is mildly reduced. LA moderately dilated.  (previous 05/10/20 LVEF 60-65%.  G1DD, Mod LVH, elevated LAP)  - LCA 04/08/23: Ost LAD to Prox LAD 95%, previously treated. Mid LAD 30%, previously treated. Ost LCX to Prox Lcx and Mid LCX is 100%. Very difficult but successful RCA management due to diffuse and calcified disease. Prox RCA to Mid RCA 70%, treated w/ DES (0% post residual); Mid RCA to Dist RCA 80%, treated with DES (10% post residual). Distal RCA w/ side branch in RPDA 95%, treated w/ DES (20% post residual in Main branch and 0% in side branch). LIMA to Dit LAD visualized and no disease. SG to 3rd MRG: normal caliber and minimal luminal irregularities. SG to 2nd Diag: normal caliber and no disease. LV size and systolic function is normal. LVEF 55-65%. No RWMA. Recommended continue DAPT indefinitely, aggressive RF treatment.  - IV NTG stopped, Currently on ASA 81 mg, Plavix 75 mg, Coreg 25 mg twice daily and Crestor 40 mg  - this am she denied any complaints. She will follow up with Dr. Kirke Corin.  - Dicussed importance of avoiding heavy lifting for 5-7 days. Also discussed importance of healthy lifestyle, including diet and exercise. Denied any smoking or ETOH use.  - Awaiting Cardiac Rehab. Suspect she can be discharge today. - Continue medications above plus PRN nitroglycerin    CAD  s/p CABG in 06/2020 with LIMA-LAD, SVG-LCx and SVG-Diag.  HLD  -  LDL this admission: 49 -  cardiac catheterization as above - IV NTG stopped, Continue ASA, Plavix, Coreg and Crestor plus PRN nitroglycerin   - Echo see above   PAD  s/p left common iliac artery stenting followed by bypass with left external iliac to left profundofemoral and chronically occluded bilateral SFAs.  -Followed by vascular surgery   Renal Artery stenosis - S/p bilateral renal artery stenting -  this admission Renal angiography at same time of cardiac catheterization  - She reported that previous renal artery stent improved BP in the pass. She is hoping for similar results this time.  - Renal angiography 04/08/23: Widely patent left renal artery stent with no significant restenosis. Patent right renal artery stent with significant in-stent restenosis 80%.   Recommended Right renal artery stenosis four outpatient treatment if BP remain uncontrolled despite 3 antiHTN meds.   - She plans to f/u with Dr. Kirke Corin to set up outpatient treatment.    Hypertensive urgency - BP is more controlled but still remains elevated systolic 140-150's - Patient notes she was previously on Coreg 6.25 at home.  - currently on amlodipine 10 mg, Coreg 25 mg twice daily, lisinopril 20 mg BID  - Continue above medications until follow up appointment with Dr. Kirke Corin   Type 2 DM  - A1C 11/2022: 6.2 -Treated with insulin.  Managed by PCP     For questions or updates, please contact [facility] Please consult [email or web address] for contact info under        Signed, Basilio Cairo, PA-C  04/09/2023, 6:24 AM

## 2023-04-09 NOTE — Progress Notes (Signed)
Mobility Specialist Progress Note;   04/09/23 1423  Mobility  Activity Ambulated independently in hallway  Level of Assistance Standby assist, set-up cues, supervision of patient - no hands on  Assistive Device None  Distance Ambulated (ft) 200 ft  Activity Response Tolerated well  Mobility Referral Yes  Mobility visit 1 Mobility  Mobility Specialist Start Time (ACUTE ONLY) 1423  Mobility Specialist Stop Time (ACUTE ONLY) 1430  Mobility Specialist Time Calculation (min) (ACUTE ONLY) 7 min   Pt standing in doorframe stating she needs to ambulate d/t swollen legs, agreeable to mobility. Required no physical assistance during ambulation, SV. Took 1x standing rest break d/t fatigue. No c/o other than swollen legs. Pt returned safely back to bed with all needs met.   Caesar Bookman Mobility Specialist Please contact via SecureChat or [employer] [phone]

## 2023-04-09 NOTE — Progress Notes (Signed)
CARDIAC REHAB PHASE I   PRE:  Rate/Rhythm: 68 SR   BP:  Sitting: 154/49      SaO2: 97 RA  MODE:  Ambulation: 150 ft   POST:  Rate/Rhythm: 77 SR  BP:  Sitting: 151/44      SaO2: 98 RA   Pt ambulated in hallway, tolerated well with no CP, dizziness or SOB. Returned to bed with call bell and bedside table in reach. Post MI/stent education including restrictions, risk factors, exercise guidelines, antiplatelet therapy importance, MI booklet, NTG use, heart healthy diabetic diet and CRP2 reviewed. All questions and concerns addressed. Will refer to AP for CRP2. Plan for possible discharge home later today.   [id number] Woodroe Chen, RN BSN 04/09/2023 9:52 AM

## 2023-04-10 DIAGNOSIS — I214 Non-ST elevation (NSTEMI) myocardial infarction: Secondary | ICD-10-CM | POA: Diagnosis not present

## 2023-04-10 DIAGNOSIS — I152 Hypertension secondary to endocrine disorders: Secondary | ICD-10-CM | POA: Diagnosis not present

## 2023-04-10 DIAGNOSIS — E1159 Type 2 diabetes mellitus with other circulatory complications: Secondary | ICD-10-CM | POA: Diagnosis not present

## 2023-04-10 DIAGNOSIS — I160 Hypertensive urgency: Secondary | ICD-10-CM | POA: Diagnosis not present

## 2023-04-10 LAB — CBC
HCT: 36.6 % (ref 36.0–46.0)
Hemoglobin: 12.3 g/dL (ref 12.0–15.0)
MCH: 31.1 pg (ref 26.0–34.0)
MCHC: 33.6 g/dL (ref 30.0–36.0)
MCV: 92.7 fL (ref 80.0–100.0)
Platelets: 186 10*3/uL (ref 150–400)
RBC: 3.95 MIL/uL (ref 3.87–5.11)
RDW: 13.9 % (ref 11.5–15.5)
WBC: 9.2 10*3/uL (ref 4.0–10.5)
nRBC: 0 % (ref 0.0–0.2)

## 2023-04-10 LAB — GLUCOSE, CAPILLARY
Glucose-Capillary: 175 mg/dL — ABNORMAL HIGH (ref 70–99)
Glucose-Capillary: 234 mg/dL — ABNORMAL HIGH (ref 70–99)

## 2023-04-10 MED ORDER — PANTOPRAZOLE SODIUM 40 MG PO TBEC: 40.0000 mg | DELAYED_RELEASE_TABLET | Freq: Every day | ORAL | 0 refills | Status: DC

## 2023-04-10 MED ORDER — CARVEDILOL 25 MG PO TABS: 25.0000 mg | ORAL_TABLET | Freq: Two times a day (BID) | ORAL | 2 refills | Status: DC

## 2023-04-10 MED ORDER — SPIRONOLACTONE 25 MG PO TABS: 25.0000 mg | ORAL_TABLET | Freq: Every day | ORAL | 2 refills | Status: DC

## 2023-04-10 MED ORDER — AMLODIPINE BESYLATE 10 MG PO TABS: 10.0000 mg | ORAL_TABLET | Freq: Every day | ORAL | 2 refills | Status: DC

## 2023-04-10 MED ORDER — NITROGLYCERIN 0.4 MG SL SUBL: 0.4000 mg | SUBLINGUAL_TABLET | SUBLINGUAL | 3 refills | Status: AC | PRN

## 2023-04-10 MED ORDER — SPIRONOLACTONE 25 MG PO TABS
25.0000 mg | ORAL_TABLET | Freq: Every day | ORAL | Status: DC
  Administered 2023-04-10: 25 mg via ORAL
  Filled 2023-04-10: qty 1

## 2023-04-10 NOTE — Progress Notes (Signed)
Mobility Specialist Progress Note;   04/10/23 1045  Mobility  Activity Ambulated independently in hallway  Level of Assistance Standby assist, set-up cues, supervision of patient - no hands on  Assistive Device None  Distance Ambulated (ft) 200 ft  Activity Response Tolerated well  Mobility Referral Yes  Mobility visit 1 Mobility  Mobility Specialist Start Time (ACUTE ONLY) 1045  Mobility Specialist Stop Time (ACUTE ONLY) 1100  Mobility Specialist Time Calculation (min) (ACUTE ONLY) 15 min   Pt agreeable to mobility. Required no physical assistance during ambulation, SV. VSS throughout and no c/o during session. Pt returned safely back to EOB with all needs met.   Caesar Bookman Mobility Specialist Please contact via SecureChat or [employer] [phone]

## 2023-04-10 NOTE — TOC Transition Note (Signed)
Transition of Care [facility]) - Discharge Note   Patient Details  Name: Leah Ferrell MRN: [id number] Date of Birth: 1950-07-10  Transition of Care [facility]) CM/SW Contact:  Gala Lewandowsky, RN Phone Number: 04/10/2023, 11:57 AM   Clinical Narrative: Patient presented for hypertensive urgency. PTA patient was from home alone. Family supports the patient and they will provide transportation home. No further needs identified at this time.    Final next level of care: Home/Self Care Barriers to Discharge: No Barriers Identified   Discharge Plan and Services Additional resources added to the After Visit Summary for   In-house Referral: NA Discharge Planning Services: CM Consult Post Acute Care Choice: NA            DME Agency: NA  Social Drivers of Health (SDOH) Interventions SDOH Screenings   Food Insecurity: No Food Insecurity (04/06/2023)  Housing: Low Risk  (04/06/2023)  Transportation Needs: No Transportation Needs (04/06/2023)  Utilities: Not At Risk (04/06/2023)  Alcohol Screen: Low Risk  (12/25/2022)  Depression (PHQ2-9): Low Risk  (12/25/2022)  Financial Resource Strain: Low Risk  (12/25/2022)  Physical Activity: Insufficiently Active (12/25/2022)  Social Connections: Moderately Isolated (04/06/2023)  Stress: No Stress Concern Present (12/25/2022)  Tobacco Use: Medium Risk (04/06/2023)  Health Literacy: Adequate Health Literacy (12/25/2022)   Readmission Risk Interventions     No data to display

## 2023-04-10 NOTE — Discharge Summary (Signed)
Physician Discharge Summary  Leah Ferrell WUJ:[id number] DOB: 28-Mar-1950 DOA: 04/06/2023  PCP: Junie Spencer, FNP  Admit date: 04/06/2023 Discharge date: 04/10/2023  Admitted From: Home Discharge disposition: Home  Recommendations at discharge:  Ensure compliance with heart medicines Follow-up with cardiology as an outpatient   Brief narrative: Leah Ferrell is a 73 y.o. female with PMH significant for CAD/CABG 2022, PAD/stent/bypass, renovascular hypertension with bilateral renal stenting 2021, chronic diastolic CHF, DM2, HLD.  2/17, patient was brought to ED at [facility] from home by EMS for significant elevated blood pressure. EMS noted blood pressure of 222/68.   Apparently patient has been struggling with blood pressure control for the past few days and was double dosing her medications by self without significant change. She also reported worsening dyspnea on exertion and episode of chest pressure for past 3 to 4 weeks.  In the ED, initial blood pressure was 197/66, heart rate in 60s, breathing on room air EKG shows some subtle ST depressions troponin was elevated at 320 > 355.  Unremarkable BMP CBC. Head CT negative for acute abnormality, advanced chronic small vessel ischemic changes. Chest x-ray unremarkable.  EDP discussed with cardiologist Dr. Wyline Mood, who recommended starting heparin drip In the ED, she was also started on nitroglycerin drip and given oral antihypertensives. Admitted to [facility] Seen by cardiology. Cardiac cath was planned and hence patient was transferred to [facility].  Subjective: Patient was seen and examined this morning. Sitting up at the edge of the bed.  Not in distress.  No new symptoms.  Feels ready to go home today.  Waiting for daughter to pick her up this afternoon.  Hospital course: NSTEMI s/p 3 stents -2/19 H/o CAD S/p prior CABG 2022 HLD Primary presented with hypertensive urgency.  Also reported 3 to 4 weeks of  progressive dyspnea on exertion Troponin elevated 320 >355>395 Seen by cardiology and started on heparin drip. Underwent cardiac cath on 2/19.  3 stents placed. Continue aspirin, Plavix, Coreg, Crestor, Coreg, as needed nitroglycerin Follow-up with cardiology as an outpatient.  Renal artery stenosis S/p prior bilateral renal artery stenting.   Renal angiography performed with cardiac cath yesterday 2/19.  Left renal stent patent.  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 DAPT indefinitely as above.   PAD S/p prior left common iliac artery stenting followed by bypass Follows up with vascular surgery DAPT indefinitely as above  Hypertensive urgency Initial blood pressure over 220 systolic  Started on nitroglycerin drip in the ED Medications adjusted based on blood pressure response.   Per cardiology recommendation by discharge carvedilol 25 mg twice daily, Norvasc 10 mg daily, lisinopril 20 mg twice daily, Aldactone 25 mg daily BP improved with improvement in dose of Coreg.  Type 2 diabetes mellitus A1c 6.2 on October 2024 PTA meds-Tresiba 20 units nightly, NovoLog Premeal 3 times daily and Ozempic Continue the same.  Prescription sent to [facility] pharmacy at patient's request.   Goals of care   Code Status: Full Code   Diet:  Diet Order             Diet general           Diet Heart Room service appropriate? Yes; Fluid consistency: Thin  Diet effective now                   Nutritional status:  Body mass index is 36.55 kg/m.       Wounds:  -  Discharge Exam:   Vitals:   04/09/23 1827 04/09/23 2015 04/10/23 0448 04/10/23 1145  BP: (!) 156/50 (!) 152/44 (!) 161/56 (!) 137/44  Pulse:   69 (!) 56  Resp:  18 18 17   Temp:  98.4 F (36.9 C) 98.1 F (36.7 C) 98.5 F (36.9 C)  TempSrc:  Oral Oral Oral  SpO2:   98% 96%  Weight:      Height:        Body mass index is 36.55 kg/m.  General exam: Pleasant, elderly  female Skin: No rashes, lesions or ulcers. HEENT: Atraumatic, normocephalic, no obvious bleeding Lungs: Clear to auscultation bilaterally,  CVS: S1, S2, no murmur,   GI/Abd: Soft, nontender, nondistended, bowel sound present,   CNS: Alert, awake, alert x 3 Psychiatry: Mood appropriate,  Extremities: No pedal edema, no calf tenderness,   Follow ups:    Follow-up Information     Junie Spencer, FNP Follow up.   Specialty: Family Medicine Contact information: [address] [place] [address] [phone]                 Discharge Instructions:   Discharge Instructions     Amb Referral to Cardiac Rehabilitation   Complete by: As directed    Diagnosis:  Coronary Stents NSTEMI     After initial evaluation and assessments completed: Virtual Based Care may be provided alone or in conjunction with Phase 2 Cardiac Rehab based on patient barriers.: Yes   Intensive Cardiac Rehabilitation (ICR) [facility] location only OR Traditional Cardiac Rehabilitation (TCR) *If criteria for ICR are not met will enroll in TCR [facility] only): Yes   Call MD for:  difficulty breathing, headache or visual disturbances   Complete by: As directed    Call MD for:  extreme fatigue   Complete by: As directed    Call MD for:  hives   Complete by: As directed    Call MD for:  persistant dizziness or light-headedness   Complete by: As directed    Call MD for:  persistant nausea and vomiting   Complete by: As directed    Call MD for:  severe uncontrolled pain   Complete by: As directed    Call MD for:  temperature >100.4   Complete by: As directed    Diet general   Complete by: As directed    Discharge instructions   Complete by: As directed    Recommendations at discharge:   Ensure compliance with heart medicines  Follow-up with cardiology as an outpatient  Discharge instructions for diabetes mellitus: Check blood sugar 3 times a day and bedtime at home. If blood sugar running above 200  or less than 70 please call your MD to adjust insulin. If you notice signs and symptoms of hypoglycemia (low blood sugar) like jitteriness, confusion, thirst, tremor and sweating, please check blood sugar, drink sugary drink/biscuits/sweets to increase sugar level and call MD or return to ER.      General discharge instructions: Follow with Primary MD Junie Spencer, FNP in 7 days  Please request your PCP  to go over your hospital tests, procedures, radiology results at the follow up. Please get your medicines reviewed and adjusted.  Your PCP may decide to repeat certain labs or tests as needed. Do not drive, operate heavy machinery, perform activities at heights, swimming or participation in water activities or provide baby sitting services if your were admitted for syncope or siezures until you have seen by Primary MD  or a Neurologist and advised to do so again. [place] Controlled Substance Reporting System database was reviewed. Do not drive, operate heavy machinery, perform activities at heights, swim, participate in water activities or provide baby-sitting services while on medications for pain, sleep and mood until your outpatient physician has reevaluated you and advised to do so again.  You are strongly recommended to comply with the dose, frequency and duration of prescribed medications. Activity: As tolerated with Full fall precautions use walker/cane & assistance as needed Avoid using any recreational substances like cigarette, tobacco, alcohol, or non-prescribed drug. If you experience worsening of your admission symptoms, develop shortness of breath, life threatening emergency, suicidal or homicidal thoughts you must seek medical attention immediately by calling 911 or calling your MD immediately  if symptoms less severe. You must read complete instructions/literature along with all the possible adverse reactions/side effects for all the medicines you take and that have been  prescribed to you. Take any new medicine only after you have completely understood and accepted all the possible adverse reactions/side effects.  Wear Seat belts while driving. You were cared for by a hospitalist during your hospital stay. If you have any questions about your discharge medications or the care you received while you were in the hospital after you are discharged, you can call the unit and ask to speak with the hospitalist or the covering physician. Once you are discharged, your primary care physician will handle any further medical issues. Please note that NO REFILLS for any discharge medications will be authorized once you are discharged, as it is imperative that you return to your primary care physician (or establish a relationship with a primary care physician if you do not have one).   Increase activity slowly   Complete by: As directed        Discharge Medications:   Allergies as of 04/10/2023       Reactions   Tape Rash        Medication List     TAKE these medications    acetaminophen 325 MG tablet Commonly known as: TYLENOL Take 2 tablets (650 mg total) by mouth every 6 (six) hours as needed for mild pain (or Fever >/= 101).   albuterol 108 (90 Base) MCG/ACT inhaler Commonly known as: VENTOLIN HFA INHALE 1 TO 2 PUFFS BY MOUTH EVERY 6 HOURS AS NEEDED FOR WHEEZING AND FOR SHORTNESS OF BREATH   amLODipine 10 MG tablet Commonly known as: NORVASC Take 1 tablet (10 mg total) by mouth daily. Start taking on: April 11, 2023 What changed:  medication strength how much to take   aspirin EC 81 MG tablet Take 81 mg by mouth daily. In the morning   blood glucose meter kit and supplies Dispense based on patient and insurance preference. Use up to four times daily as directed. (FOR ICD-10 E10.9, E11.9).   Caltrate 600+D Plus Minerals 600-800 MG-UNIT Tabs Take 1 tablet by mouth 3 (three) times daily.   carvedilol 25 MG tablet Commonly known as: COREG Take 1  tablet (25 mg total) by mouth 2 (two) times daily with a meal. What changed:  medication strength how much to take when to take this   clopidogrel 75 MG tablet Commonly known as: PLAVIX Take 1 tablet by mouth once daily   CORICIDIN HBP COLD/FLU PO Take 1 tablet by mouth daily as needed (cough/cold).   fish oil-omega-3 fatty acids 1000 MG capsule Take 1 g by mouth daily.   Glucose Meter Test test  strip Generic drug: glucose blood Use 6-10 times a day, Uses ReliOn   HumaLOG KwikPen 200 UNIT/ML KwikPen Generic drug: insulin lispro Inject 50 Units into the skin 3 (three) times daily before meals.   hydroxypropyl methylcellulose / hypromellose 2.5 % ophthalmic solution Commonly known as: ISOPTO TEARS / GONIOVISC Place 1 drop into both eyes 3 (three) times daily as needed (for dry eyes).   Insulin Syringe 27G X 1/2" 0.5 ML Misc Use with Fiasp insulin TID   lisinopril 20 MG tablet Commonly known as: ZESTRIL Take 1 tablet by mouth twice daily   montelukast 10 MG tablet Commonly known as: SINGULAIR Take 1 tablet by mouth once daily with breakfast   nitroGLYCERIN 0.4 MG SL tablet Commonly known as: Nitrostat Place 1 tablet (0.4 mg total) under the tongue every 5 (five) minutes as needed for chest pain.   Ozempic (2 MG/DOSE) 8 MG/3ML Sopn Generic drug: Semaglutide (2 MG/DOSE) Inject 2 mg into the skin once a week.   pantoprazole 40 MG tablet Commonly known as: PROTONIX Take 1 tablet (40 mg total) by mouth daily. Start taking on: April 11, 2023   ReliOn Pen Needles 29G X Misc Generic drug: Insulin Pen Needle USE 1 NEEDLE TO INJECT INSULIN SUBCUTANEOUSLY 3 TIMES DAILY   BD ULTRA-FINE PEN NEEDLES 29G X 12.7MM Misc Generic drug: Insulin Pen Needle USE PENNEEDLES 3 TIMES  DAILY AS DIRECTED   rosuvastatin 40 MG tablet Commonly known as: CRESTOR Take 1 tablet by mouth once daily   spironolactone 25 MG tablet Commonly known as: ALDACTONE Take 1 tablet (25 mg  total) by mouth daily. Start taking on: April 11, 2023   Evaristo Bury FlexTouch 100 UNIT/ML FlexTouch Pen Generic drug: insulin degludec Inject 15-30 Units into the skin daily. What changed: how much to take   vitamin C 1000 MG tablet Take 1,000 mg by mouth 3 (three) times daily.   Vitamin D-3 125 MCG (5000 UT) Tabs Take 5,000 Units by mouth daily.         The results of significant diagnostics from this hospitalization (including imaging, microbiology, ancillary and laboratory) are listed below for reference.    Procedures and Diagnostic Studies:   DG Chest Port 1 View Result Date: 04/06/2023 CLINICAL DATA:  Syncope/presyncope.  Hypertension. EXAM: PORTABLE CHEST 1 VIEW COMPARISON:  07/12/2020 FINDINGS: Previous median sternotomy. Heart size remains normal. Aortic atherosclerotic calcification as seen previously. The lungs are clear. No pulmonary edema. No pleural effusion. No visible acute bone finding. IMPRESSION: No active disease. Previous median sternotomy. Aortic atherosclerotic calcification. Electronically Signed   By: Paulina Fusi M.D.   On: 04/06/2023 14:51   CT Head Wo Contrast Result Date: 04/06/2023 CLINICAL DATA:  Syncope/presyncope. Cerebrovascular cause suspected. Hypertension. EXAM: CT HEAD WITHOUT CONTRAST TECHNIQUE: Contiguous axial images were obtained from the base of the skull through the vertex without intravenous contrast. RADIATION DOSE REDUCTION: This exam was performed according to the departmental dose-optimization program which includes automated exposure control, adjustment of the mA and/or kV according to patient size and/or use of iterative reconstruction technique. COMPARISON:  11/22/2018 FINDINGS: Brain: No acute CT finding. Advanced chronic small-vessel ischemic changes of the cerebral hemispheric white matter. No sign of acute infarction, mass lesion, hemorrhage, hydrocephalus or extra-axial collection. Vascular: There is atherosclerotic calcification of  the major vessels at the base of the brain. Skull: Negative Sinuses/Orbits: Clear/normal Other: None IMPRESSION: No acute CT finding. Advanced chronic small-vessel ischemic changes of the cerebral hemispheric white matter. Electronically Signed   By:  Paulina Fusi M.D.   On: 04/06/2023 14:51     Labs:   Basic Metabolic Panel: Recent Labs  Lab 04/06/23 1357 04/07/23 0345 04/08/23 0226 04/09/23 0314  NA 138 136 133* 137  K 4.0 3.4* 4.0 4.1  CL 99 98 98 104  CO2 31 26 24 25   GLUCOSE 141* 155* 156* 153*  BUN 10 15 16 10   CREATININE 0.67 0.62 0.83 0.70  CALCIUM 9.4 9.2 9.0 8.8*  MG 1.9  --   --   --    GFR Estimated Creatinine Clearance: 68.8 mL/min (by C-G formula based on SCr of 0.7 mg/dL). Liver Function Tests: Recent Labs  Lab 04/06/23 1357  AST 26  ALT 32  ALKPHOS 46  BILITOT 0.4  PROT 7.6  ALBUMIN 3.7   No results for input(s): "LIPASE", "AMYLASE" in the last 168 hours. No results for input(s): "AMMONIA" in the last 168 hours. Coagulation profile No results for input(s): "INR", "PROTIME" in the last 168 hours.  CBC: Recent Labs  Lab 04/06/23 1357 04/07/23 0345 04/08/23 0226 04/09/23 0314 04/10/23 0357  WBC 8.8 9.3 8.5 8.2 9.2  NEUTROABS 5.8  --   --   --   --   HGB 14.6 13.5 12.4 12.3 12.3  HCT 44.6 39.9 37.1 36.8 36.6  MCV 94.9 93.2 92.5 92.7 92.7  PLT 224 213 209 209 186   Cardiac Enzymes: No results for input(s): "CKTOTAL", "CKMB", "CKMBINDEX", "TROPONINI" in the last 168 hours. BNP: Invalid input(s): "POCBNP" CBG: Recent Labs  Lab 04/09/23 0748 04/09/23 1211 04/09/23 1635 04/09/23 2043 04/10/23 1143  GLUCAP 148* 197* 181* 250* 234*   D-Dimer No results for input(s): "DDIMER" in the last 72 hours. Hgb A1c No results for input(s): "HGBA1C" in the last 72 hours. Lipid Profile Recent Labs    04/08/23 0226  CHOL 128  HDL 52  LDLCALC 49  TRIG 134  CHOLHDL 2.5   Thyroid function studies No results for input(s): "TSH", "T4TOTAL",  "T3FREE", "THYROIDAB" in the last 72 hours.  Invalid input(s): "FREET3" Anemia work up No results for input(s): "VITAMINB12", "FOLATE", "FERRITIN", "TIBC", "IRON", "RETICCTPCT" in the last 72 hours. Microbiology Recent Results (from the past 240 hours)  MRSA Next Gen by PCR, Nasal     Status: None   Collection Time: 04/06/23 10:58 PM   Specimen: Nasal Mucosa; Nasal Swab  Result Value Ref Range Status   MRSA by PCR Next Gen NOT DETECTED NOT DETECTED Final    Comment: (NOTE) The GeneXpert MRSA Assay (FDA approved for NASAL specimens only), is one component of a comprehensive MRSA colonization surveillance program. It is not intended to diagnose MRSA infection nor to guide or monitor treatment for MRSA infections. Test performance is not FDA approved in patients less than 78 years old. Performed at [facility], [address], [place], [place] [address]     Time coordinating discharge: 45 minutes  Signed: Melina Schools Janeann Paisley  [facility] 04/10/2023, 11:47 AM

## 2023-04-10 NOTE — Progress Notes (Signed)
Patient Name: Leah Ferrell Date of Encounter: 04/10/2023  [facility] Cardiologist: Lorine Bears, MD   Interval Summary  .    Patient feeling well this morning. No focal complaints. Is frustrated that breakfast has not been delivered yet.   Vital Signs .    Vitals:   04/09/23 1639 04/09/23 1827 04/09/23 2015 04/10/23 0448  BP: (!) 179/57 (!) 156/50 (!) 152/44 (!) 161/56  Pulse: 69   69  Resp: 17  18 18   Temp: 98.2 F (36.8 C)  98.4 F (36.9 C) 98.1 F (36.7 C)  TempSrc: Oral  Oral Oral  SpO2: 99%   98%  Weight:      Height:        Intake/Output Summary (Last 24 hours) at 04/10/2023 0836 Last data filed at 04/09/2023 1216 Gross per 24 hour  Intake 360 ml  Output --  Net 360 ml      04/08/2023    3:52 AM 04/07/2023    9:27 PM 04/07/2023    3:00 AM  Last 3 Weights  Weight (lbs) 203 lb 0.7 oz 197 lb 15.6 oz 198 lb 13.7 oz  Weight (kg) 92.1 kg 89.8 kg 90.2 kg      Telemetry/ECG    Sinus rhythm with PVCs - Personally Reviewed  Physical Exam .   GEN: No acute distress.   Neck: No JVD Cardiac: RRR, no murmurs, rubs, or gallops.  Respiratory: Clear to auscultation bilaterally. GI: Soft, nontender, non-distended  MS: No edema  Assessment & Plan .    73 y.o. female with a hx of CAD (s/p CABG in 06/2020 with LIMA-LAD, SVG-LCx and SVG-Diag), PAD (s/p left common iliac artery stenting followed by bypass with left external iliac to left profundofemoral and chronically occluded bilateral SFAs), renovascular hypertension with bilateral renal stenting in 02/2019, chronic HFpEF, HTN, HLD and Type II DM who was evaluated on 04/07/2023 for elevated troponin values at the request of Dr. Wendall Stade. She was transferred from [facility] for cath and renal angiography simultaneously.    NSTEMI Coronary artery disease status post coronary artery bypass grafting CABG in 2022 with LIMA-LAD, SVG-LCx and SVG-Diag.  Patient presented to [facility] emergency department on  04/06/2023 for evaluation of progressive shortness of breath on exertion as well as chest pain for a 3 to 4-week.  High-sensitivity troponin elevated 210, 355, 395.  Given her coronary history, patient transported to [facility] for cardiac evaluation with left heart catheterization.  Ost LAD to Prox LAD 95%, previously treated. Mid LAD 30%, previously treated. Ost LCX to Prox Lcx and Mid LCX is 100%. Very difficult but successful RCA management due to diffuse and calcified disease. Prox RCA to Mid RCA 70%, treated w/ DES (0% post residual); Mid RCA to Dist RCA 80%, treated with DES (10% post residual). Distal RCA w/ side branch in RPDA 95%, treated w/ DES (20% post residual in Main branch and 0% in side branch). LIMA to Dit LAD visualized and no disease. SG to 3rd MRG: normal caliber and minimal luminal irregularities. SG to 2nd Diag: normal caliber and no disease. LV size and systolic function is normal. LVEF 55-65%. No RWMA.  Continue dual antiplatelet therapy indefinitely, aspirin 81 mg with Plavix 75 mg. Continue carvedilol 25 mg twice daily Continue Crestor 40 mg once daily Continue as needed nitroglycerin Close follow-up arranged  PAD  s/p left common iliac artery stenting followed by bypass with left external iliac to left profundofemoral and chronically occluded bilateral SFAs.  Follow-up with Dr. Kirke Corin DAPT and statin as above  Renal artery stenosis Patient with prior bilateral renal artery stenting, underwent renal angiography this admission and was found to have widely patent left renal artery stent with no significant restenosis.  Right renal artery stent with significant in-stent restenosis 80%. Will arrange outpatient follow-up with Dr. Kirke Corin, for consideration of outpatient intervention on right renal artery in stent restenosis.   Hypertension Blood pressure moderately elevated throughout this admission, 161/56 this morning. Continue carvedilol 25 mg twice daily, amlodipine 10 mg,  lisinopril 20 mg twice daily. Add spironolactone 25 mg  DM type II Per primary team.    For questions or updates, please contact [facility] Please consult [email or web address] for contact info under        Signed, Perlie Gold, PA-C

## 2023-04-10 NOTE — Plan of Care (Addendum)
Pt D/C home, Removed all IV's AVS reviewed , belongings returned. All follow up questions answered. No other needs expressed at this time. BP (!) 137/44 (BP Location: Right Arm)   Pulse (!) 56   Temp 98.5 F (36.9 C) (Oral)   Resp 17   Ht 5' 2.5" (1.588 m)   Wt 92.1 kg   SpO2 96%   BMI 36.55 kg/m  Reva Bores 04/10/23 12:06 PM

## 2023-04-11 LAB — LIPOPROTEIN A (LPA): Lipoprotein (a): 241.5 nmol/L — ABNORMAL HIGH (ref <75.0)

## 2023-04-13 ENCOUNTER — Telehealth: Payer: Self-pay | Admitting: *Deleted

## 2023-04-13 NOTE — Transitions of Care (Post Inpatient/ED Visit) (Signed)
   04/13/2023  Name: CHERINE DRUMGOOLE MRN: [id number] DOB: September 15, 1950  Today's TOC FU Call Status: Today's TOC FU Call Status:: Unsuccessful Call (1st Attempt) Unsuccessful Call (1st Attempt) Date: 04/13/23  Attempted to reach the patient regarding the most recent Inpatient/ED visit, female answered the telephone and states she does not want anyone calling her for any reason.  Follow Up Plan: No further outreach attempts will be made at this time. We have been unable to contact the patient.  Irving Shows [facility], BSN RN Care Manager/ Transition of Care / [facility] [phone]

## 2023-04-15 ENCOUNTER — Ambulatory Visit: Payer: Medicare Other

## 2023-04-17 ENCOUNTER — Telehealth: Payer: Self-pay | Admitting: Pharmacist

## 2023-04-17 NOTE — Telephone Encounter (Signed)
   Patient enrolled in the Novo Nordisk patient assistance program  Reviewed insulin with patient: Leah Ferrell 20 units nightly Rapid acting (has to alternate brand depending on what we have) 30-40 units with meals Ozempic 2mg  weekly Patient is not comfortable increasing basal to even out the 50/50 basal/bolus requirements Will plan to use CGM for patient as she is able/willing Patient with recent heart attack--needs ASA 81mg  daily   Will follow up with patient in 2 weeks Blood sugar at goal Continue current regimen  Kieth Brightly, PharmD, BCACP, CPP Clinical Pharmacist, [facility]

## 2023-04-22 ENCOUNTER — Telehealth (HOSPITAL_COMMUNITY): Payer: Self-pay

## 2023-04-22 NOTE — Telephone Encounter (Signed)
 Called patient regarding cardiac rehab referral for NSTEMI/Stent placement. She says she is not interested in participating at this time. She has a treadmill and plans to exercise on her own. She said she would call us if she changed her mind.

## 2023-04-28 ENCOUNTER — Encounter: Payer: Self-pay | Admitting: Cardiovascular Disease

## 2023-04-28 ENCOUNTER — Ambulatory Visit: Payer: Medicare Other | Attending: Cardiovascular Disease | Admitting: Cardiovascular Disease

## 2023-04-28 VITALS — BP 122/66 | HR 55 | Ht 62.5 in | Wt 202.8 lb

## 2023-04-28 DIAGNOSIS — I739 Peripheral vascular disease, unspecified: Secondary | ICD-10-CM

## 2023-04-28 DIAGNOSIS — E785 Hyperlipidemia, unspecified: Secondary | ICD-10-CM | POA: Diagnosis not present

## 2023-04-28 DIAGNOSIS — I25708 Atherosclerosis of coronary artery bypass graft(s), unspecified, with other forms of angina pectoris: Secondary | ICD-10-CM | POA: Diagnosis not present

## 2023-04-28 DIAGNOSIS — I150 Renovascular hypertension: Secondary | ICD-10-CM | POA: Diagnosis not present

## 2023-04-28 NOTE — Patient Instructions (Signed)
Medication Instructions:  No changes *If you need a refill on your cardiac medications before your next appointment, please call your pharmacy*   Lab Work: None ordered If you have labs (blood work) drawn today and your tests are completely normal, you will receive your results only by: MyChart Message (if you have MyChart) OR A paper copy in the mail If you have any lab test that is abnormal or we need to change your treatment, we will call you to review the results.   Testing/Procedures: None ordered   Follow-Up: At [facility], you and your health needs are our priority.  As part of our continuing mission to provide you with exceptional heart care, we have created designated Provider Care Teams.  These Care Teams include your primary Cardiologist (physician) and Advanced Practice Providers (APPs -  Physician Assistants and Nurse Practitioners) who all work together to provide you with the care you need, when you need it.  We recommend signing up for the patient portal called "MyChart".  Sign up information is provided on this After Visit Summary.  MyChart is used to connect with patients for Virtual Visits (Telemedicine).  Patients are able to view lab/test results, encounter notes, upcoming appointments, etc.  Non-urgent messages can be sent to your provider as well.   To learn more about what you can do with MyChart, go to [email or web address].    Your next appointment:   3 month(s)  Provider:   Lorine Bears, MD

## 2023-04-28 NOTE — Progress Notes (Signed)
 Cardiology Office Note   Date:  04/28/2023   ID:  Leah Ferrell, DOB 1950-03-02, MRN [id number]  PCP:  Junie Spencer, FNP  Cardiologist: Dr. Kirke Corin   No chief complaint on file.    History of Present Illness: Leah Ferrell is a 73 y.o. female who presents for a followup visit regarding peripheral arterial disease and coronary artery disease.  She has known history of coronary artery disease status post drug-eluting stent placement to the LAD X 2 with subsequent CABG in May 2022. She is known to have peripheral arterial disease status post left common iliac artery stent placement followed by left common femoral artery resection and bypass from left external iliac to the left profunda done by Dr. Hart Rochester in 2015. She is known to have chronically occluded bilateral SFAs being managed medically.  She had worsening left leg claudication in 2018 with elevated velocity at the proximal anastomosis of the graft.   Angiography in March 2018 showed mild to moderate calcified common iliac artery disease with patent stent in the left common iliac artery. There was severe stenosis in the left common femoral artery at the proximal anastomosis of the bypass to the profunda. I performed successful angioplasty and drug-coated balloon angioplasty.   She is status post bilateral renal artery stenting in January 2021 due to refractory hypertension and recurrent heart failure.  She had significant improvement in blood pressure control as well as heart failure symptoms since then.    Lower extremity angiography in March of 2022 showed patent renal artery stents with significant restenosis on the right side, severe bilateral heavily calcified ostial common iliac artery disease extending into the distal aorta.  The iliac arteries were treated by successful kissing stent placement.  On the right side, there was moderate common femoral artery stenosis as well as chronically occluded SFA with reconstitution  distally via collaterals from the profunda, short occlusion of above-the-knee popliteal artery with collaterals and three-vessel runoff below the knee.  On the left side, there was flush occlusion of the SFA with collaterals from the profunda and two-vessel runoff below the knee. She underwent CABG in May, 2022 with LIMA to LAD, SVG to OM and SVG to diagonal.  Most recent ABI was done in October 2024 and was moderately to severely reduced bilaterally at 0.49.  She was hospitalized last month with non-STEMI in the setting of severely elevated blood pressure.  Echocardiogram showed an EF of 50 to 55%.  Cardiac catheterization was done via the left radial artery and showed heavily calcified coronary arteries with severe underlying three-vessel coronary artery disease with patent grafts including LIMA to LAD, SVG to diagonal and SVG to OM 3.  There was severe progression of native coronary artery disease including the right coronary artery which was not bypassed.  I performed difficult but successful PCI and 3 overlapped drug-eluting stent placement to the right coronary artery.  Renal angiography showed widely patent left renal artery stent with no significant restenosis.  Right renal artery stent was patent but with severe in-stent restenosis proximally. She reports feeling significantly better and she only had 1 episode of chest pain since discharge.  She is mostly limited by severe bilateral leg claudication that happens after a short distance walking.  No rest pain or lower extremity ulceration.   Past Medical History:  Diagnosis Date   CAD (coronary artery disease)    a. 04/2012 NSTEMI: s/p DES to LAD.   Chronic diastolic CHF (congestive heart failure) (HCC)  Diabetes mellitus without complication (HCC)    Dysrhythmia    Environmental and seasonal allergies    Hyperlipidemia    Hypertension    Leukocytosis    Morbid obesity (HCC)    NSTEMI (non-ST elevated myocardial infarction) (HCC)  04/27/2012   PONV (postoperative nausea and vomiting)    PVD (peripheral vascular disease) (HCC)    a. 04/2012 ABI: R 0.49, L 0.39. Angiography 06/14: Significant ostial left common iliac artery stenosis extending into the distal aorta, severe left common femoral artery stenosis, bilateral SFA occlusion with heavy calcifications. Functionally, one-vessel runoff bilaterally below the knee   Renal artery stenosis (HCC)    s/p angioplasty/stenting bilateral renal arteries 03/16/19   Shortness of breath     Past Surgical History:  Procedure Laterality Date   ABDOMINAL AORTAGRAM N/A 07/21/2012   Procedure: ABDOMINAL Ronny Flurry;  Surgeon: Iran Ouch, MD;  Location: [facility] CATH LAB;  Service: Cardiovascular;  Laterality: N/A;   ABDOMINAL AORTOGRAM W/LOWER EXTREMITY N/A 05/09/2020   Procedure: ABDOMINAL AORTOGRAM W/LOWER EXTREMITY;  Surgeon: Iran Ouch, MD;  Location: [facility] INVASIVE CV LAB;  Service: Cardiovascular;  Laterality: N/A;   CARDIAC CATHETERIZATION     stents X 2   CATARACT EXTRACTION W/PHACO Left 09/08/2022   Procedure: CATARACT EXTRACTION PHACO AND INTRAOCULAR LENS PLACEMENT (IOC) with placement of Corticosteroid;  Surgeon: Fabio Pierce, MD;  Location: [facility] ORS;  Service: Ophthalmology;  Laterality: Left;  CDE 8.48   CORONARY ANGIOGRAM  04/27/2012   Procedure: CORONARY ANGIOGRAM;  Surgeon: Vesta Mixer, MD;  Location: [facility] CATH LAB;  Service: Cardiovascular;;   CORONARY ARTERY BYPASS GRAFT N/A 06/25/2020   Procedure: CORONARY ARTERY BYPASS GRAFTING (CABG)X3. LEFT INTERNAL MAMMARY ARTERY. RIGHT ENDOSCOPIC SAPHENOUS VEIN HARVESTING.;  Surgeon: Linden Dolin, MD;  Location: [facility] OR;  Service: Open Heart Surgery;  Laterality: N/A;   CORONARY STENT INTERVENTION N/A 04/08/2023   Procedure: CORONARY STENT INTERVENTION;  Surgeon: Iran Ouch, MD;  Location: [facility] INVASIVE CV LAB;  Service: Cardiovascular;  Laterality: N/A;  RCA   ENDARTERECTOMY FEMORAL Left 11/02/2013   Procedure: RESECTION  LEFT COMMON FEMORAL ARTERY AND INSERTION OF INTERPOSITION 7mm HEMASHIELD GRAFT FROM LEFT EXTERNAL  ILIAC ARTERY TO LEFT PROFUNDA  FEMORIS ARTERY;  Surgeon: Pryor Ochoa, MD;  Location: [facility] OR;  Service: Vascular;  Laterality: Left;   EYE SURGERY Right 2017   FRACTURE SURGERY Right Jul 07, 2014   Right Foot-  [place] [facility]  by Heat duct   ILIAC ARTERY STENT Left 08/31/2013   LAD stent     LEFT HEART CATH AND CORONARY ANGIOGRAPHY N/A 05/09/2020   Procedure: LEFT HEART CATH AND CORONARY ANGIOGRAPHY;  Surgeon: Iran Ouch, MD;  Location: [facility] INVASIVE CV LAB;  Service: Cardiovascular;  Laterality: N/A;   LEFT HEART CATH AND CORS/GRAFTS ANGIOGRAPHY N/A 04/08/2023   Procedure: LEFT HEART CATH AND CORS/GRAFTS ANGIOGRAPHY;  Surgeon: Iran Ouch, MD;  Location: [facility] CV LAB;  Service: Cardiovascular;  Laterality: N/A;   LEFT HEART CATHETERIZATION WITH CORONARY ANGIOGRAM N/A 04/23/2012   Procedure: LEFT HEART CATHETERIZATION WITH CORONARY ANGIOGRAM;  Surgeon: Peter M Swaziland, MD;  Location: [facility] CATH LAB;  Service: Cardiovascular;  Laterality: N/A;   LOWER EXTREMITY ANGIOGRAM Left 08/31/2013   Procedure: LOWER EXTREMITY ANGIOGRAM;  Surgeon: Iran Ouch, MD;  Location: [facility] CATH LAB;  Service: Cardiovascular;  Laterality: Left;   PERCUTANEOUS STENT INTERVENTION Left 08/31/2013   Procedure: PERCUTANEOUS STENT INTERVENTION;  Surgeon: Iran Ouch, MD;  Location: [facility] CATH  LAB;  Service: Cardiovascular;  Laterality: Left;  Common Iliac artery   PERIPHERAL VASCULAR CATHETERIZATION N/A 03/19/2016   Procedure: Abdominal Aortogram w/Lower Extremity;  Surgeon: Iran Ouch, MD;  Location: [facility] INVAS[facility] CV LAB;  Service: Cardiovascular;  Laterality: N/A;   PERIPHERAL VASCULAR CATHETERIZATION  03/19/2016   Procedure: Peripheral Vascular Balloon Angioplasty-CFA Left;  Surgeon: Iran Ouch, MD;  Location: [facility] INVASIVE CV LAB;  Service: Cardiovascular;;   PERIPHERAL VASCULAR INTERVENTION Bilateral  03/16/2019   Procedure: PERIPHERAL VASCULAR INTERVENTION;  Surgeon: Iran Ouch, MD;  Location: [facility]VAS[facility] CV LAB;  Service: Cardiovascular;  Laterality: Bilateral;  RENAL   PERIPHERAL VASCULAR INTERVENTION Bilateral 05/09/2020   Procedure: PERIPHERAL VASCULAR INTERVENTION;  Surgeon: Iran Ouch, MD;  Location: [facility] INVASIVE CV LAB;  Service: Cardiovascular;  Laterality: Bilateral;  Iliac   RENAL ANGIOGRAPHY  03/16/2019   RENAL ANGIOGRAPHY Bilateral 03/16/2019   Procedure: RENAL ANGIOGRAPHY;  Surgeon: Iran Ouch, MD;  Location: [facility] INVASIVE CV LAB;  Service: Cardiovascular;  Laterality: Bilateral;   RENAL ANGIOGRAPHY Bilateral 04/08/2023   Procedure: RENAL ANGIOGRAPHY;  Surgeon: Iran Ouch, MD;  Location: [facility] INVASIVE CV LAB;  Service: Cardiovascular;  Laterality: Bilateral;   TEE WITHOUT CARDIOVERSION N/A 06/25/2020   Procedure: TRANSESOPHAGEAL ECHOCARDIOGRAM (TEE);  Surgeon: Linden Dolin, MD;  Location: [facility];  Service: Open Heart Surgery;  Laterality: N/A;   TUMOR REMOVAL     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 20 tablet 0   albuterol (VENTOLIN HFA) 108 (90 Base) MCG/ACT inhaler INHALE 1 TO 2 PUFFS BY MOUTH EVERY 6 HOURS AS NEEDED FOR WHEEZING AND FOR SHORTNESS OF BREATH 9 g 2   amLODipine (NORVASC) 10 MG tablet Take 1 tablet (10 mg total) by mouth daily. 30 tablet 2   Ascorbic Acid (VITAMIN C) 1000 MG tablet Take 1,000 mg by mouth 3 (three) times daily.      aspirin EC 81 MG tablet Take 81 mg by mouth daily. In the morning     BD ULTRA-FINE PEN NEEDLES 29G X 12.7MM MISC USE PENNEEDLES 3 TIMES  DAILY AS DIRECTED 270 each 1   blood glucose meter kit and supplies Dispense based on patient and insurance preference. Use up to four times daily as directed. (FOR ICD-10 E10.9,  E11.9). 1 each 0   Calcium Carbonate-Vit D-Min (CALTRATE 600+D PLUS MINERALS) 600-800 MG-UNIT TABS Take 1 tablet by mouth 3 (three) times daily.      carvedilol (COREG) 25 MG tablet Take 1 tablet (25 mg total) by mouth 2 (two) times daily with a meal. 60 tablet 2   Chlorpheniramine-Acetaminophen (CORICIDIN HBP COLD/FLU PO) Take 1 tablet by mouth daily as needed (cough/cold).     Cholecalciferol (VITAMIN D-3) 125 MCG (5000 UT) TABS Take 5,000 Units by mouth daily.     clopidogrel (PLAVIX) 75 MG tablet Take 1 tablet by mouth once daily 90 tablet 0   fish oil-omega-3 fatty acids 1000 MG capsule Take 1 g by mouth daily.      glucose blood (GLUCOSE METER TEST) test strip Use 6-10 times a day, Uses ReliOn 900 each 12   hydroxypropyl methylcellulose (ISOPTO TEARS) 2.5 % ophthalmic solution Place 1 drop into both eyes 3 (three) times daily as needed (for dry eyes).  insulin degludec (TRESIBA FLEXTOUCH) 100 UNIT/ML FlexTouch Pen Inject 15-30 Units into the skin daily. (Patient taking differently: Inject 20 Units into the skin daily.) 15 mL 0   insulin lispro (HUMALOG KWIKPEN) 200 UNIT/ML KwikPen Inject 50 Units into the skin 3 (three) times daily before meals. 45 mL 11   Insulin Syringe 27G X 1/2" 0.5 ML MISC Use with Fiasp insulin TID 100 each 3   lisinopril (ZESTRIL) 20 MG tablet Take 1 tablet by mouth twice daily 180 tablet 0   montelukast (SINGULAIR) 10 MG tablet Take 1 tablet by mouth once daily with breakfast 90 tablet 1   nitroGLYCERIN (NITROSTAT) 0.4 MG SL tablet Place 1 tablet (0.4 mg total) under the tongue every 5 (five) minutes as needed for chest pain. 10 tablet 3   pantoprazole (PROTONIX) 40 MG tablet Take 1 tablet (40 mg total) by mouth daily. 30 tablet 0   RELION PEN NEEDLES 29G X MISC USE 1 NEEDLE TO INJECT INSULIN SUBCUTANEOUSLY 3 TIMES DAILY (Patient taking differently: 1 Device by Subconjunctival route 3 (three) times daily.) 50 each 5   rosuvastatin (CRESTOR) 40 MG tablet Take 1  tablet by mouth once daily 90 tablet 0   Semaglutide, 2 MG/DOSE, (OZEMPIC, 2 MG/DOSE,) 8 MG/3ML SOPN Inject 2 mg into the skin once a week. 3 mL 0   spironolactone (ALDACTONE) 25 MG tablet Take 1 tablet (25 mg total) by mouth daily. 30 tablet 2   No current facility-administered medications for this visit.    Allergies:   Tape    Social History:  The patient  reports that she quit smoking about 22 years ago. Her smoking use included cigarettes. She started smoking about 59 years ago. She has a 36.8 pack-year smoking history. She has never used smokeless tobacco. She reports that she does not drink alcohol and does not use drugs.   Family History:  The patient's family history includes Alcohol abuse in her brother; Diabetes in her brother, daughter, maternal grandmother, mother, and son; Heart attack in her maternal grandfather; Heart disease in her brother; Hyperlipidemia in her mother and son; Hypertension in her daughter, mother, and son; Other in her sister and sister; Peripheral vascular disease in her mother.    ROS:  Please see the history of present illness.   Otherwise, review of systems are positive for none.   All other systems are reviewed and negative.    PHYSICAL EXAM: VS:  BP 122/66   Pulse (!) 55   Ht 5' 2.5" (1.588 m)   Wt 202 lb 12.8 oz (92 kg)   SpO2 98%   BMI 36.50 kg/m  , BMI Body mass index is 36.5 kg/m. GEN: Well nourished, well developed, in no acute distress  HEENT: normal  Neck: no JVD or masses.  Right carotid bruit. Cardiac: RRR; no  rubs, or gallops , trace edema . There is 2/6 systolic ejection murmur in the aortic area.  Respiratory:  clear to auscultation bilaterally, normal work of breathing GI: soft, nontender, nondistended, + BS MS: no deformity or atrophy  Skin: warm and dry, no rash Neuro:  Strength and sensation are intact Psych: euthymic mood, full affect Distal pulses are not palpable.   Left radial pulse is normal with no hematoma.  EKG:   EKG is ordered today. EKG showed: Sinus bradycardia Inferior infarct , age undetermined ST & T wave abnormality, consider lateral ischemia When compared with ECG of 09-Apr-2023 05:09, Inferior infarct is now Present T wave inversion now  evident in Inferior leads T wave inversion less evident in Lateral leads QT has shortened    Recent Labs: 12/15/2022: TSH 1.700 04/06/2023: ALT 32; Magnesium 1.9 04/09/2023: BUN 10; Creatinine, Ser 0.70; Potassium 4.1; Sodium 137 04/10/2023: Hemoglobin 12.3; Platelets 186    Lipid Panel    Component Value Date/Time   CHOL 128 04/08/2023 0226   CHOL 174 12/15/2022 1144   TRIG 134 04/08/2023 0226   TRIG 135 09/16/2012 1322   HDL 52 04/08/2023 0226   HDL 73 12/15/2022 1144   HDL 60 09/16/2012 1322   CHOLHDL 2.5 04/08/2023 0226   VLDL 27 04/08/2023 0226   LDLCALC 49 04/08/2023 0226   LDLCALC 80 12/15/2022 1144   LDLCALC 92 09/16/2012 1322      Wt Readings from Last 3 Encounters:  04/28/23 202 lb 12.8 oz (92 kg)  04/08/23 203 lb 0.7 oz (92.1 kg)  12/25/22 200 lb (90.7 kg)           No data to display            ASSESSMENT AND PLAN:  1. Peripheral arterial disease: Status post  kissing stent placement to the common iliac arteries .  She has moderately reduced ABI at 0.49 bilaterally with severe Rutherford class III claudication which significantly affects her ability to exercise.  She is known to have heavily calcified vessels.  I am going to refer her to a structured exercise program.  If no improvement in symptoms, angiography and revascularization can be considered. The patient was educated on risk factors surrounding PAD. I discussed the importance of controlling risk factors and importance of regular exercise to help reduce pain. I discussed the effects of exercise on reducing claudications and improving his risk factors including hyperlipidemia and hypertension.    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  Recent angiography confirmed patent left renal artery stent with no significant restenosis but there was significant right renal artery in-stent restenosis.  Her renal function is normal.  As long as her blood pressure is controlled, no plans for revascularization of in-stent restenosis.  Will continue to monitor for now.  3. Coronary artery disease involving native coronary arteries with stable angina: Status post recent successful PCI and drug-eluting stent placement to the native right coronary artery which was not bypassed before.  Her vessels are known to be heavily calcified.  She benefits from cardiac rehab but she reports being more limited by claudication and it might be helpful if we tailor the rehab to her PAD.  4. Hyperlipidemia: Continue high-dose rosuvastatin.  Recent lipid profile showed improvement in LDL which was down to 49.  Her LP(a) was severely elevated at 241 and we might have to address that at some point in the future.    Disposition:   FU with me in 3  month  Signed,  Lorine Bears, MD  04/28/2023 9:15 AM    [facility]

## 2023-05-05 ENCOUNTER — Other Ambulatory Visit: Payer: Self-pay | Admitting: Family

## 2023-05-07 ENCOUNTER — Other Ambulatory Visit: Payer: Self-pay | Admitting: *Deleted

## 2023-05-07 DIAGNOSIS — E1142 Type 2 diabetes mellitus with diabetic polyneuropathy: Secondary | ICD-10-CM | POA: Diagnosis not present

## 2023-05-07 DIAGNOSIS — B351 Tinea unguium: Secondary | ICD-10-CM | POA: Diagnosis not present

## 2023-05-07 DIAGNOSIS — M79676 Pain in unspecified toe(s): Secondary | ICD-10-CM | POA: Diagnosis not present

## 2023-05-08 MED ORDER — PANTOPRAZOLE SODIUM 40 MG PO TBEC: 40.0000 mg | DELAYED_RELEASE_TABLET | Freq: Every day | ORAL | 0 refills | Status: DC

## 2023-05-11 ENCOUNTER — Ambulatory Visit (INDEPENDENT_AMBULATORY_CARE_PROVIDER_SITE_OTHER): Payer: Medicare Other | Admitting: Family

## 2023-05-11 ENCOUNTER — Encounter: Payer: Self-pay | Admitting: Family

## 2023-05-11 VITALS — BP 158/81 | HR 56 | Temp 97.6°F | Ht 62.5 in | Wt 202.8 lb

## 2023-05-11 DIAGNOSIS — I252 Old myocardial infarction: Secondary | ICD-10-CM | POA: Diagnosis not present

## 2023-05-11 DIAGNOSIS — E1151 Type 2 diabetes mellitus with diabetic peripheral angiopathy without gangrene: Secondary | ICD-10-CM | POA: Diagnosis not present

## 2023-05-11 DIAGNOSIS — E1159 Type 2 diabetes mellitus with other circulatory complications: Secondary | ICD-10-CM

## 2023-05-11 DIAGNOSIS — Z6836 Body mass index (BMI) 36.0-36.9, adult: Secondary | ICD-10-CM

## 2023-05-11 DIAGNOSIS — I25708 Atherosclerosis of coronary artery bypass graft(s), unspecified, with other forms of angina pectoris: Secondary | ICD-10-CM

## 2023-05-11 DIAGNOSIS — I152 Hypertension secondary to endocrine disorders: Secondary | ICD-10-CM | POA: Diagnosis not present

## 2023-05-11 LAB — BAYER DCA HB A1C WAIVED: HB A1C (BAYER DCA - WAIVED): 7.1 % — ABNORMAL HIGH (ref 4.8–5.6)

## 2023-05-11 NOTE — Progress Notes (Signed)
 Subjective:    Patient ID: Leah Ferrell, female    DOB: 1951-02-06, 73 y.o.   MRN: [id number]  Chief Complaint  Patient presents with   Hospitalization Follow-up   PT presents to the office today for hospital follow up. She went to the ED on 04/06/23 with HTN, dyspnea on exertion. EKG showed ST depression and troponin elevated. She was started on heparin drip.   She diagnosed with NSTEMI s/p 3 stents with hx of CAD add CABG 2022. Continue aspirin, Plavix, Coreg, Crestor, Coreg, as needed nitroglycerin Follow-up with cardiology as an outpatient.  Per cardiology recommendation by discharge carvedilol 25 mg twice daily, Norvasc 10 mg daily, lisinopril 20 mg twice daily, Aldactone 25 mg daily.  She is scheduled to start PT 05/20/23.    She is followed by Cardiologists, Vascular,   Hypertension This is a chronic problem. The current episode started more than 1 year ago. The problem has been waxing and waning since onset. The problem is uncontrolled. Associated symptoms include blurred vision, malaise/fatigue and peripheral edema. Pertinent negatives include no shortness of breath. Risk factors for coronary artery disease include dyslipidemia, obesity and sedentary lifestyle. The current treatment provides mild improvement.  Diabetes She presents for her follow-up diabetic visit. She has type 2 diabetes mellitus. Associated symptoms include blurred vision and foot paresthesias. Symptoms are stable. Diabetic complications include heart disease and peripheral neuropathy. Risk factors for coronary artery disease include dyslipidemia, diabetes mellitus, hypertension, sedentary lifestyle and post-menopausal. Her overall blood glucose range is 140-180 mg/dl.      Review of Systems  Constitutional:  Positive for malaise/fatigue.  Eyes:  Positive for blurred vision.  Respiratory:  Negative for shortness of breath.   All other systems reviewed and are negative.   Social History    Socioeconomic History   Marital status: Widowed    Spouse name: Not on file   Number of children: 4   Years of education: completed school in [place]    Highest education level: Not on file  Occupational History   Occupation: retired  Tobacco Use   Smoking status: Former    Current packs/day: 0.00    Average packs/day: 1 pack/day for 36.8 years (36.8 ttl pk-yrs)    Types: Cigarettes    Start date: 04/21/1964    Quit date: 02/17/2001    Years since quitting: 22.2   Smokeless tobacco: Never  Vaping Use   Vaping status: Never Used  Substance and Sexual Activity   Alcohol use: No    Alcohol/week: 0.0 standard drinks of alcohol   Drug use: No   Sexual activity: Not Currently    Birth control/protection: Post-menopausal  Other Topics Concern   Not on file  Social History Narrative   Lives alone - one level - all of her children live far away   Social Drivers of Health   Financial Resource Strain: Low Risk  (12/25/2022)   Overall Financial Resource Strain (CARDIA)    Difficulty of Paying Living Expenses: Not hard at all  Food Insecurity: No Food Insecurity (04/06/2023)   Hunger Vital Sign    Worried About Running Out of Food in the Last Year: Never true    Ran Out of Food in the Last Year: Never true  Transportation Needs: No Transportation Needs (04/06/2023)   PRAPARE - Administrator, Civil Service (Medical): No    Lack of Transportation (Non-Medical): No  Physical Activity: Insufficiently Active (12/25/2022)   Exercise Vital Sign    Days  of Exercise per Week: 3 days    Minutes of Exercise per Session: 30 min  Stress: No Stress Concern Present (12/25/2022)   [employer] of Occupational Health - Occupational Stress Questionnaire    Feeling of Stress : Not at all  Social Connections: Moderately Isolated (04/06/2023)   Social Connection and Isolation Panel [NHANES]    Frequency of Communication with Friends and Family: More than three times a week    Frequency  of Social Gatherings with Friends and Family: More than three times a week    Attends Religious Services: 1 to 4 times per year    Active Member of [employer] or Organizations: No    Attends Banker Meetings: Never    Marital Status: Widowed   Family History  Problem Relation Age of Onset   Diabetes Mother    Hyperlipidemia Mother    Hypertension Mother    Peripheral vascular disease Mother    Other Sister        drowned   Other Sister        drowned   Diabetes Daughter        borderline   Hypertension Daughter    Diabetes Maternal Grandmother    Heart attack Maternal Grandfather    Heart disease Brother    Diabetes Brother    Alcohol abuse Brother    Diabetes Son    Hypertension Son    Hyperlipidemia Son    Breast cancer Neg Hx         Objective:   Physical Exam Vitals reviewed.  Constitutional:      General: She is not in acute distress.    Appearance: She is well-developed. She is obese.  HENT:     Head: Normocephalic and atraumatic.     Right Ear: Tympanic membrane normal.     Left Ear: Tympanic membrane normal.  Eyes:     Pupils: Pupils are equal, round, and reactive to light.  Neck:     Thyroid: No thyromegaly.  Cardiovascular:     Rate and Rhythm: Normal rate and regular rhythm.     Heart sounds: Normal heart sounds. No murmur heard. Pulmonary:     Effort: Pulmonary effort is normal. No respiratory distress.     Breath sounds: Normal breath sounds. No wheezing.  Abdominal:     General: Bowel sounds are normal. There is no distension.     Palpations: Abdomen is soft.     Tenderness: There is no abdominal tenderness.  Musculoskeletal:        General: No tenderness. Normal range of motion.     Cervical back: Normal range of motion and neck supple.     Right lower leg: Edema (trace) present.     Left lower leg: Edema (trace) present.  Skin:    General: Skin is warm and dry.  Neurological:     Mental Status: She is alert and oriented to  person, place, and time.     Cranial Nerves: No cranial nerve deficit.     Deep Tendon Reflexes: Reflexes are normal and symmetric.  Psychiatric:        Behavior: Behavior normal.        Thought Content: Thought content normal.        Judgment: Judgment normal.       BP (!) 158/81   Pulse (!) 56   Temp 97.6 F (36.4 C) (Temporal)   Ht 5' 2.5" (1.588 m)   Wt 202 lb 12.8 oz (92 kg)  SpO2 100%   BMI 36.50 kg/m      Assessment & Plan:  Leah Ferrell comes in today with chief complaint of Hospitalization Follow-up   Diagnosis and orders addressed:  1. Coronary artery disease of bypass graft of native heart with stable angina pectoris (HCC) - CMP14+EGFR  2. Diabetes mellitus with peripheral vascular disease (HCC) (Primary) - Bayer DCA Hb A1c Waived - CMP14+EGFR  3. Hx of non-ST elevation myocardial infarction (NSTEMI) - CMP14+EGFR  4. Hypertension associated with diabetes (HCC) - CMP14+EGFR  5. Morbid obesity (HCC) - CMP14+EGFR   Labs pending Continue current medications  BP is elevated today, but was seen on 04/28/23 and her BP was 122/66.  -Dash diet information given -Exercise encouraged - Stress Management  -Continue current meds Keep follow up with specialists  Health Maintenance reviewed Diet and exercise encouraged  Return in about 3 months (around 08/11/2023), or if symptoms worsen or fail to improve.    Jannifer Rodney, FNP

## 2023-05-11 NOTE — Patient Instructions (Signed)
 Hypertension, Adult High blood pressure (hypertension) is when the force of blood pumping through the arteries is too strong. The arteries are the blood vessels that carry blood from the heart throughout the body. Hypertension forces the heart to work harder to pump blood and may cause arteries to become narrow or stiff. Untreated or uncontrolled hypertension can lead to a heart attack, heart failure, a stroke, kidney disease, and other problems. A blood pressure reading consists of a higher number over a lower number. Ideally, your blood pressure should be below 120/80. The first ("top") number is called the systolic pressure. It is a measure of the pressure in your arteries as your heart beats. The second ("bottom") number is called the diastolic pressure. It is a measure of the pressure in your arteries as the heart relaxes. What are the causes? The exact cause of this condition is not known. There are some conditions that result in high blood pressure. What increases the risk? Certain factors may make you more likely to develop high blood pressure. Some of these risk factors are under your control, including: Smoking. Not getting enough exercise or physical activity. Being overweight. Having too much fat, sugar, calories, or salt (sodium) in your diet. Drinking too much alcohol. Other risk factors include: Having a personal history of heart disease, diabetes, high cholesterol, or kidney disease. Stress. Having a family history of high blood pressure and high cholesterol. Having obstructive sleep apnea. Age. The risk increases with age. What are the signs or symptoms? High blood pressure may not cause symptoms. Very high blood pressure (hypertensive crisis) may cause: Headache. Fast or irregular heartbeats (palpitations). Shortness of breath. Nosebleed. Nausea and vomiting. Vision changes. Severe chest pain, dizziness, and seizures. How is this diagnosed? This condition is diagnosed by  measuring your blood pressure while you are seated, with your arm resting on a flat surface, your legs uncrossed, and your feet flat on the floor. The cuff of the blood pressure monitor will be placed directly against the skin of your upper arm at the level of your heart. Blood pressure should be measured at least twice using the same arm. Certain conditions can cause a difference in blood pressure between your right and left arms. If you have a high blood pressure reading during one visit or you have normal blood pressure with other risk factors, you may be asked to: Return on a different day to have your blood pressure checked again. Monitor your blood pressure at home for 1 week or longer. If you are diagnosed with hypertension, you may have other blood or imaging tests to help your health care provider understand your overall risk for other conditions. How is this treated? This condition is treated by making healthy lifestyle changes, such as eating healthy foods, exercising more, and reducing your alcohol intake. You may be referred for counseling on a healthy diet and physical activity. Your health care provider may prescribe medicine if lifestyle changes are not enough to get your blood pressure under control and if: Your systolic blood pressure is above 130. Your diastolic blood pressure is above 80. Your personal target blood pressure may vary depending on your medical conditions, your age, and other factors. Follow these instructions at home: Eating and drinking  Eat a diet that is high in fiber and potassium, and low in sodium, added sugar, and fat. An example of this eating plan is called the DASH diet. DASH stands for Dietary Approaches to Stop Hypertension. To eat this way: Eat  plenty of fresh fruits and vegetables. Try to fill one half of your plate at each meal with fruits and vegetables. Eat whole grains, such as whole-wheat pasta, brown rice, or whole-grain bread. Fill about one  fourth of your plate with whole grains. Eat or drink low-fat dairy products, such as skim milk or low-fat yogurt. Avoid fatty cuts of meat, processed or cured meats, and poultry with skin. Fill about one fourth of your plate with lean proteins, such as fish, chicken without skin, beans, eggs, or tofu. Avoid pre-made and processed foods. These tend to be higher in sodium, added sugar, and fat. Reduce your daily sodium intake. Many people with hypertension should eat less than 1,500 mg of sodium a day. Do not drink alcohol if: Your health care provider tells you not to drink. You are pregnant, may be pregnant, or are planning to become pregnant. If you drink alcohol: Limit how much you have to: 0-1 drink a day for women. 0-2 drinks a day for men. Know how much alcohol is in your drink. In the [place] one drink equals one 12 oz bottle of beer (355 mL), one 5 oz glass of wine (148 mL), or one 1 oz glass of hard liquor (44 mL). Lifestyle  Work with your health care provider to maintain a healthy body weight or to lose weight. Ask what an ideal weight is for you. Get at least 30 minutes of exercise that causes your heart to beat faster (aerobic exercise) most days of the week. Activities may include walking, swimming, or biking. Include exercise to strengthen your muscles (resistance exercise), such as Pilates or lifting weights, as part of your weekly exercise routine. Try to do these types of exercises for 30 minutes at least 3 days a week. Do not use any products that contain nicotine or tobacco. These products include cigarettes, chewing tobacco, and vaping devices, such as e-cigarettes. If you need help quitting, ask your health care provider. Monitor your blood pressure at home as told by your health care provider. Keep all follow-up visits. This is important. Medicines Take over-the-counter and prescription medicines only as told by your health care provider. Follow directions carefully. Blood  pressure medicines must be taken as prescribed. Do not skip doses of blood pressure medicine. Doing this puts you at risk for problems and can make the medicine less effective. Ask your health care provider about side effects or reactions to medicines that you should watch for. Contact a health care provider if you: Think you are having a reaction to a medicine you are taking. Have headaches that keep coming back (recurring). Feel dizzy. Have swelling in your ankles. Have trouble with your vision. Get help right away if you: Develop a severe headache or confusion. Have unusual weakness or numbness. Feel faint. Have severe pain in your chest or abdomen. Vomit repeatedly. Have trouble breathing. These symptoms may be an emergency. Get help right away. Call 911. Do not wait to see if the symptoms will go away. Do not drive yourself to the hospital. Summary Hypertension is when the force of blood pumping through your arteries is too strong. If this condition is not controlled, it may put you at risk for serious complications. Your personal target blood pressure may vary depending on your medical conditions, your age, and other factors. For most people, a normal blood pressure is less than 120/80. Hypertension is treated with lifestyle changes, medicines, or a combination of both. Lifestyle changes include losing weight, eating a healthy,  low-sodium diet, exercising more, and limiting alcohol. This information is not intended to replace advice given to you by your health care provider. Make sure you discuss any questions you have with your health care provider. Document Revised: 12/11/2020 Document Reviewed: 12/11/2020 Elsevier Patient Education  2024 [employer].

## 2023-05-12 ENCOUNTER — Other Ambulatory Visit: Payer: Self-pay | Admitting: Family

## 2023-05-12 LAB — CMP14+EGFR
ALT: 19 IU/L (ref 0–32)
AST: 17 IU/L (ref 0–40)
Albumin: 4.1 g/dL (ref 3.8–4.8)
Alkaline Phosphatase: 58 IU/L (ref 44–121)
BUN/Creatinine Ratio: 18 (ref 12–28)
BUN: 15 mg/dL (ref 8–27)
Bilirubin Total: 0.2 mg/dL (ref 0.0–1.2)
CO2: 26 mmol/L (ref 20–29)
Calcium: 9.7 mg/dL (ref 8.7–10.3)
Chloride: 97 mmol/L (ref 96–106)
Creatinine, Ser: 0.84 mg/dL (ref 0.57–1.00)
Globulin, Total: 3.1 g/dL (ref 1.5–4.5)
Glucose: 184 mg/dL — ABNORMAL HIGH (ref 70–99)
Potassium: 5.3 mmol/L — ABNORMAL HIGH (ref 3.5–5.2)
Sodium: 138 mmol/L (ref 134–144)
Total Protein: 7.2 g/dL (ref 6.0–8.5)
eGFR: 74 mL/min/{1.73_m2} (ref >59)

## 2023-05-13 ENCOUNTER — Other Ambulatory Visit: Payer: Self-pay | Admitting: Family

## 2023-05-18 ENCOUNTER — Encounter (HOSPITAL_COMMUNITY)
Admission: RE | Admit: 2023-05-18 | Discharge: 2023-05-18 | Disposition: A | Discharge status: Home / Self Care | Source: Ambulatory Visit | Attending: Cardiovascular Disease | Admitting: Cardiovascular Disease

## 2023-05-18 DIAGNOSIS — I739 Peripheral vascular disease, unspecified: Secondary | ICD-10-CM | POA: Insufficient documentation

## 2023-05-18 NOTE — Progress Notes (Signed)
 Virtual orientation visit completed for the PAD program. On-site orientation visit scheduled for 05/20/23 at 1300. Documentation mailed and patient says her daughter helped her complete.

## 2023-05-19 ENCOUNTER — Telehealth: Payer: Self-pay | Admitting: Family Medicine

## 2023-05-19 NOTE — Telephone Encounter (Unsigned)
 Copied from CRM [id number]. Topic: Clinical - Medication Question >> May 19, 2023  3:10 PM Everette C wrote: Reason for CRM: The patient would like to please be contacted directly by A. Naomii Kreger CMA when possible

## 2023-05-19 NOTE — Telephone Encounter (Signed)
 Patient calling states she forgot to mention her overactive bladder and would like a medication sent in.

## 2023-05-20 ENCOUNTER — Encounter (HOSPITAL_COMMUNITY)

## 2023-05-20 NOTE — Telephone Encounter (Signed)
 Pt states that she wants Christy to prescribe medication to help with her overactive bladder. She is aware that Neysa Bonito and Austin Miles are off today and the message can be address tomorrow.

## 2023-05-20 NOTE — Telephone Encounter (Signed)
 Pt called requesting a call back from O[facility]o please advise, no further info disclosed when asked.

## 2023-05-21 MED ORDER — OXYBUTYNIN CHLORIDE ER 5 MG PO TB24: 5.0000 mg | ORAL_TABLET | Freq: Every day | ORAL | 1 refills | Status: DC

## 2023-05-21 NOTE — Telephone Encounter (Signed)
 Oxybutynin 5 mg Prescription sent to pharmacy

## 2023-05-21 NOTE — Telephone Encounter (Signed)
 Patient aware and verbalized understanding.

## 2023-05-25 ENCOUNTER — Encounter (HOSPITAL_COMMUNITY)
Admission: RE | Admit: 2023-05-25 | Discharge: 2023-05-25 | Disposition: A | Discharge status: Home / Self Care | Source: Ambulatory Visit | Attending: Cardiovascular Disease | Admitting: Cardiovascular Disease

## 2023-05-25 VITALS — Ht 61.25 in | Wt 206.0 lb

## 2023-05-25 DIAGNOSIS — I70213 Atherosclerosis of native arteries of extremities with intermittent claudication, bilateral legs: Secondary | ICD-10-CM | POA: Insufficient documentation

## 2023-05-25 DIAGNOSIS — Z955 Presence of coronary angioplasty implant and graft: Secondary | ICD-10-CM | POA: Insufficient documentation

## 2023-05-25 DIAGNOSIS — Z48812 Encounter for surgical aftercare following surgery on the circulatory system: Secondary | ICD-10-CM | POA: Insufficient documentation

## 2023-05-25 DIAGNOSIS — I739 Peripheral vascular disease, unspecified: Secondary | ICD-10-CM | POA: Insufficient documentation

## 2023-05-25 NOTE — Progress Notes (Signed)
 PAT/SET Individual Treatment Plan  Patient Details  Name: Leah Ferrell MRN: [id number] Date of Birth: 16-Jul-1950 Referring Provider:   Normajean Glasgow Row Supervised Exercise Therapy from 05/25/2023 in [facility] CARDIAC REHABILITATION  Referring Provider Lorine Bears MD       Initial Encounter Date:  Flowsheet Row Supervised Exercise Therapy from 05/25/2023 in [facility] CARDIAC REHABILITATION  Date 05/25/23       Visit Diagnosis: PAD (peripheral artery disease) (HCC)  Patient's Home Medications on Admission:  Current Outpatient Medications:    acetaminophen (TYLENOL) 325 MG tablet, Take 2 tablets (650 mg total) by mouth every 6 (six) hours as needed for mild pain (or Fever >/= 101)., Disp: 20 tablet, Rfl: 0   albuterol (VENTOLIN HFA) 108 (90 Base) MCG/ACT inhaler, INHALE 1 TO 2 PUFFS BY MOUTH EVERY 6 HOURS AS NEEDED FOR WHEEZING AND FOR SHORTNESS OF BREATH, Disp: 9 g, Rfl: 2   amLODipine (NORVASC) 10 MG tablet, Take 1 tablet (10 mg total) by mouth daily., Disp: 30 tablet, Rfl: 2   Ascorbic Acid (VITAMIN C) 1000 MG tablet, Take 1,000 mg by mouth 3 (three) times daily. , Disp: , Rfl:    aspirin EC 81 MG tablet, Take 81 mg by mouth daily. In the morning, Disp: , Rfl:    BD ULTRA-FINE PEN NEEDLES 29G X 12.7MM MISC, USE PENNEEDLES 3 TIMES  DAILY AS DIRECTED, Disp: 270 each, Rfl: 1   blood glucose meter kit and supplies, Dispense based on patient and insurance preference. Use up to four times daily as directed. (FOR ICD-10 E10.9, E11.9)., Disp: 1 each, Rfl: 0   Calcium Carbonate-Vit D-Min (CALTRATE 600+D PLUS MINERALS) 600-800 MG-UNIT TABS, Take 1 tablet by mouth 3 (three) times daily. , Disp: , Rfl:    carvedilol (COREG) 25 MG tablet, Take 1 tablet (25 mg total) by mouth 2 (two) times daily with a meal., Disp: 60 tablet, Rfl: 2   Chlorpheniramine-Acetaminophen (CORICIDIN HBP COLD/FLU PO), Take 1 tablet by mouth daily as needed (cough/cold)., Disp: , Rfl:    Cholecalciferol (VITAMIN D-3)  125 MCG (5000 UT) TABS, Take 5,000 Units by mouth daily., Disp: , Rfl:    clopidogrel (PLAVIX) 75 MG tablet, Take 1 tablet by mouth once daily, Disp: 90 tablet, Rfl: 0   fish oil-omega-3 fatty acids 1000 MG capsule, Take 1 g by mouth daily. , Disp: , Rfl:    glucose blood (GLUCOSE METER TEST) test strip, Use 6-10 times a day, Uses ReliOn, Disp: 900 each, Rfl: 12   hydroxypropyl methylcellulose (ISOPTO TEARS) 2.5 % ophthalmic solution, Place 1 drop into both eyes 3 (three) times daily as needed (for dry eyes)., Disp: , Rfl:    insulin degludec (TRESIBA FLEXTOUCH) 100 UNIT/ML FlexTouch Pen, Inject 15-30 Units into the skin daily. (Patient taking differently: Inject 20 Units into the skin daily.), Disp: 15 mL, Rfl: 0   insulin lispro (HUMALOG KWIKPEN) 200 UNIT/ML KwikPen, Inject 50 Units into the skin 3 (three) times daily before meals., Disp: 45 mL, Rfl: 11   Insulin Syringe 27G X 1/2" 0.5 ML MISC, Use with Fiasp insulin TID, Disp: 100 each, Rfl: 3   lisinopril (ZESTRIL) 20 MG tablet, Take 1 tablet by mouth twice daily, Disp: 180 tablet, Rfl: 0   montelukast (SINGULAIR) 10 MG tablet, Take 1 tablet by mouth once daily with breakfast, Disp: 90 tablet, Rfl: 1   nitroGLYCERIN (NITROSTAT) 0.4 MG SL tablet, Place 1 tablet (0.4 mg total) under the tongue every 5 (five) minutes as needed for  chest pain., Disp: 10 tablet, Rfl: 3   oxybutynin (DITROPAN-XL) 5 MG 24 hr tablet, Take 1 tablet (5 mg total) by mouth at bedtime., Disp: 90 tablet, Rfl: 1   pantoprazole (PROTONIX) 40 MG tablet, Take 1 tablet (40 mg total) by mouth daily., Disp: 30 tablet, Rfl: 0   RELION PEN NEEDLES 29G X MISC, USE 1 NEEDLE TO INJECT INSULIN SUBCUTANEOUSLY 3 TIMES DAILY (Patient taking differently: 1 Device by Subconjunctival route 3 (three) times daily.), Disp: 50 each, Rfl: 5   rosuvastatin (CRESTOR) 40 MG tablet, Take 1 tablet by mouth once daily, Disp: 90 tablet, Rfl: 0   Semaglutide, 2 MG/DOSE, (OZEMPIC, 2 MG/DOSE,) 8 MG/3ML  SOPN, Inject 2 mg into the skin once a week., Disp: 3 mL, Rfl: 0   spironolactone (ALDACTONE) 25 MG tablet, Take 1 tablet (25 mg total) by mouth daily., Disp: 30 tablet, Rfl: 2  Past Medical History: Past Medical History:  Diagnosis Date   CAD (coronary artery disease)    a. 04/2012 NSTEMI: s/p DES to LAD.   Chronic diastolic CHF (congestive heart failure) (HCC)    Diabetes mellitus without complication (HCC)    Dysrhythmia    Environmental and seasonal allergies    Hyperlipidemia    Hypertension    Leukocytosis    Morbid obesity (HCC)    NSTEMI (non-ST elevated myocardial infarction) (HCC) 04/27/2012   PONV (postoperative nausea and vomiting)    PVD (peripheral vascular disease) (HCC)    a. 04/2012 ABI: R 0.49, L 0.39. Angiography 06/14: Significant ostial left common iliac artery stenosis extending into the distal aorta, severe left common femoral artery stenosis, bilateral SFA occlusion with heavy calcifications. Functionally, one-vessel runoff bilaterally below the knee   Renal artery stenosis (HCC)    s/p angioplasty/stenting bilateral renal arteries 03/16/19   Shortness of breath     Tobacco Use: Social History   Tobacco Use  Smoking Status Former   Current packs/day: 0.00   Average packs/day: 1 pack/day for 36.8 years (36.8 ttl pk-yrs)   Types: Cigarettes   Start date: 04/21/1964   Quit date: 02/17/2001   Years since quitting: 22.2  Smokeless Tobacco Never    Labs: Review Flowsheet  More data exists      Latest Ref Rng & Units 04/17/2022 07/18/2022 12/15/2022 04/08/2023 05/11/2023  Labs for ITP Cardiac and Pulmonary Rehab  Cholestrol 0 - 200 mg/dL - 629  528  413  -  LDL (calc) 0 - 99 mg/dL - 68  80  49  -  HDL-C >40 mg/dL - 78  73  52  -  Trlycerides <150 mg/dL - 244  010  272  -  Hemoglobin A1c 4.8 - 5.6 % 8.9  7.8  6.2  - 7.1     Capillary Blood Glucose: Lab Results  Component Value Date   GLUCAP 234 (H) 04/10/2023   GLUCAP 175 (H) 04/10/2023   GLUCAP 250 (H)  04/09/2023   GLUCAP 181 (H) 04/09/2023   GLUCAP 197 (H) 04/09/2023     Exercise Target Goals: Exercise Program Goal: Individual exercise prescription set with THRR, safety & activity barriers. Participant demonstrates ability to understand and report RPE using RPE 6-20 scale, to self-measure pulse accurately, and to acknowledge the importance of the exercise prescription.  Exercise Prescription Goal: Use initial exercise assessment to set exercise prescription to improve time and distance to onset of claudication pain. Provide education to aid in steps toward risk factor modification, improve quality of life with claudication, and  utilize THRR and exercise prescription for best results for decreasing symptoms of PAD. Prevent injury to bone, joints, and skin integrity during the exercise through checks of each system during session check in. Following physician guidelines for any contraindications to specific exercises.  Activity Barriers:  Activity Barriers & Cardiac Risk Stratification - 05/25/23 1413       Activity Barriers & Cardiac Risk Stratification   Activity Barriers Deconditioning;Muscular Weakness;Assistive Device;Balance Concerns;Shortness of Breath             Gardner Treadmill Test Assessment:  Treadmill Test     Row Name 05/25/23 1604       Treadmill Test   Phase Pre    Speed if <2.0 mph 1.7 mph    Time to Claudication Onset 1.68 minutes    Total Time to 3-4/5 Claudication Pain 4.48 minutes      Heart Rate   Rest 49 bpm    0% Grade 61 bpm    2% Grade 71 bpm    4% Grade 73 bpm    2 Min Post 60 bpm      Blood Pressure   Rest 112/62    4% Grade 138/72    2 Min Post 126/74      Claudication Score   Rest 1    0% 2    2% Grade 3    4% Grade 4    2 Min Post 4      RPE   0% 13    2% Grade 15    4% Grade 17      Time in Stage   0% 2 minutes    2% Grade 2 minutes    4% Grade 0.48 minutes             Walking Impairment Questionnaire:   Walking Impairment Questionnaire - 05/25/23 1607       A. PAD Specific Questions   Leg Both    Pre Score 0    Pre % Score 0 %      B. Differential Diagnosis   Pre Score 11      Distance   Pre Distance Total Score 190    Pre Distance % Score 1.35 %      Speed   Pre Speed Total Score 8.5    Pre Speed % Score 18.48 %      Stairs   Pre Stairs Total Score 36    Pre Stairs % Score 12.5 %      Distance, Speed, and Stairs   Pre Distance, Speed, and Stairs Total Score 234.5    Pre Distance, Speed, and Stairs % Score Mean 10.78 %             Expectation is that scores will be within the SD range. Distance Pre % Score Mean: 38% (SD 12% - 0.64%)   Post % Score Mean: 55% (SD 26% - 84%)   Mean % Change: 18% (SD (-10%) - (46%))  Speed Pre % Score Mean: 41% (SD 19% - 63%)   Post % Score Mean: 52% (SD 30% - 74%)   Mean % Change: 11% (SD (-9%) - (31%))  Stairs Pre % Score Mean: 55% (SD 23% - 87%)   Post % Score Mean: 68% (SD 39% - 97%)   Mean % Change: 14% (SD (-15%) - (43%))  Total Score Pre % Score Mean: 45% (SD 23% - 67%   Post % Score Mean: 58% (SD 36% - 80%)   Mean %  Change: 14% (SD (-5%) - (43%))   Oxygen Initial Assessment:   Oxygen Re-Evaluation:   Oxygen Discharge (Final Oxygen Re-Evaluation):   Initial Exercise Prescription:  Initial Exercise Prescription - 05/25/23 1600       Date of Initial Exercise RX and Referring Provider   Date 05/25/23    Referring Provider Lorine Bears MD    Expected Discharge Date 08/14/23      Oxygen   Maintain Oxygen Saturation 88% or higher      Treadmill   MPH 1.7    Grade 0    Minutes 10    METs 2.3      Prescription Details   Frequency (times per week) 3    Duration Progress to 10 minutes continuous walking  at current work load and total walking time to 30-45 min      Intensity   THRR 40-80% of Max Heartrate 89-128    Ratings of Perceived Exertion 11-13    Perceived Dyspnea 0-4      Progression    Progression Continue to follow PAD protocol      Resistance Training   Training Prescription Yes    Weight 3 lb    Reps 10-15             Perform Capillary Blood Glucose checks as needed.  Exercise Prescription Changes:   Exercise Prescription Changes     Row Name 05/25/23 1600             Response to Exercise   Blood Pressure (Admit) 112/62       Blood Pressure (Exercise) 138/72       Blood Pressure (Exit) 126/74       Heart Rate (Admit) 49 bpm       Heart Rate (Exercise) 73 bpm       Heart Rate (Exit) 60 bpm       Oxygen Saturation (Admit) 94 %       Oxygen Saturation (Exercise) 96 %       Oxygen Saturation (Exit) 96 %       Rating of Perceived Exertion (Exercise) 17       Perceived Dyspnea (Exercise) 1       Symptoms CPS 4, slightly SOB       Comments treadmill test                Exercise Comments:   Exercise Goals and Review:   Exercise Goals     Row Name 05/25/23 1613             Exercise Goals   Increase Physical Activity Yes       Intervention Provide advice, education, support and counseling about physical activity/exercise needs.;Develop an individualized exercise prescription for aerobic and resistive training based on initial evaluation findings, risk stratification, comorbidities and participant's personal goals.       Expected Outcomes Long Term: Exercising regularly at least 3-5 days a week.;Short Term: Attend rehab on a regular basis to increase amount of physical activity.;Long Term: Add in home exercise to make exercise part of routine and to increase amount of physical activity.       Increase Strength and Stamina Yes       Intervention Provide advice, education, support and counseling about physical activity/exercise needs.;Develop an individualized exercise prescription for aerobic and resistive training based on initial evaluation findings, risk stratification, comorbidities and participant's personal goals.       Expected  Outcomes Short Term: Increase workloads from initial exercise  prescription for resistance, speed, and METs.;Short Term: Perform resistance training exercises routinely during rehab and add in resistance training at home;Long Term: Improve cardiorespiratory fitness, muscular endurance and strength as measured by increased METs and functional capacity ( )       Able to understand and use rate of perceived exertion (RPE) scale Yes       Intervention Provide education and explanation on how to use RPE scale       Expected Outcomes Short Term: Able to use RPE daily in rehab to express subjective intensity level;Long Term:  Able to use RPE to guide intensity level when exercising independently       Able to understand and use Dyspnea scale Yes       Intervention Provide education and explanation on how to use Dyspnea scale       Expected Outcomes Long Term: Able to use Dyspnea scale to guide intensity level when exercising independently;Short Term: Able to use Dyspnea scale daily in rehab to express subjective sense of shortness of breath during exertion       Knowledge and understanding of Target Heart Rate Range (THRR) Yes       Intervention Provide education and explanation of THRR including how the numbers were predicted and where they are located for reference       Expected Outcomes Short Term: Able to state/look up THRR;Long Term: Able to use THRR to govern intensity when exercising independently;Short Term: Able to use daily as guideline for intensity in rehab       Able to check pulse independently Yes       Intervention Provide education and demonstration on how to check pulse in carotid and radial arteries.;Review the importance of being able to check your own pulse for safety during independent exercise       Expected Outcomes Short Term: Able to explain why pulse checking is important during independent exercise;Long Term: Able to check pulse independently and accurately       Understanding of  Exercise Prescription Yes       Intervention Provide education, explanation, and written materials on patient's individual exercise prescription       Expected Outcomes Short Term: Able to explain program exercise prescription;Long Term: Able to explain home exercise prescription to exercise independently       Improve claudication pain toleration; Improve walking ability Yes       Intervention Participate in PAD/SET Rehab 2-3 days a week, walking at home as part of exercise prescription;Attend education sessions to aid in risk factor modification and understanding of disease process       Expected Outcomes Short Term: Improve walking distance/time to onset of claudication pain;Long Term: Improve walking ability and toleration to claudication;Long Term: Improve score of PAD questionnaires                Exercise Goals Re-Evaluation :    Discharge Exercise Prescription (Final Exercise Prescription Changes):  Exercise Prescription Changes - 05/25/23 1600       Response to Exercise   Blood Pressure (Admit) 112/62    Blood Pressure (Exercise) 138/72    Blood Pressure (Exit) 126/74    Heart Rate (Admit) 49 bpm    Heart Rate (Exercise) 73 bpm    Heart Rate (Exit) 60 bpm    Oxygen Saturation (Admit) 94 %    Oxygen Saturation (Exercise) 96 %    Oxygen Saturation (Exit) 96 %    Rating of Perceived Exertion (Exercise) 17    Perceived  Dyspnea (Exercise) 1    Symptoms CPS 4, slightly SOB    Comments treadmill test             Nutrition:  Target Goals: Understanding of nutrition guidelines, daily intake of sodium 1500mg , cholesterol 200mg , calories 30% from fat and 7% or less from saturated fats, daily to have 5 or more servings of fruits and vegetables.  Biometrics:  Pre Biometrics - 05/25/23 1614       Pre Biometrics   Height 5' 1.25" (1.556 m)    Weight 93.4 kg    Waist Circumference 40 inches    Hip Circumference 46.25 inches    Waist to Hip Ratio 0.86 %    BMI  (Calculated) 38.59    Grip Strength 33 kg    Single Leg Stand 3.1 seconds              Nutrition Therapy Plan and Nutrition Goals:  Nutrition Therapy & Goals - 05/25/23 1615       Intervention Plan   Intervention Prescribe, educate and counsel regarding individualized specific dietary modifications aiming towards targeted core components such as weight, hypertension, lipid management, diabetes, heart failure and other comorbidities.;Nutrition handout(s) given to patient.    Expected Outcomes Short Term Goal: Understand basic principles of dietary content, such as calories, fat, sodium, cholesterol and nutrients.;Long Term Goal: Adherence to prescribed nutrition plan.             Nutrition Discharge: Nutrition Scores:   Nutrition Goals Re-Evaluation:   Nutrition Goals Re-Evaluation:   Nutrition Goals Discharge (Final Nutrition Goals Re-Evaluation):   Psychosocial: Target Goals: Acknowledge presence or absence of significant depression and/or stress, maximize coping skills, provide positive support system. Participant is able to verbalize types and ability to use techniques and skills needed for reducing stress and depression.  Initial Review & Psychosocial Screening:  Initial Psych Review & Screening - 05/18/23 1027       Initial Review   Current issues with None Identified      Family Dynamics   Good Support System? Yes      Barriers   Psychosocial barriers to participate in program There are no identifiable barriers or psychosocial needs.      Screening Interventions   Interventions Encouraged to exercise;Provide feedback about the scores to participant;To provide support and resources with identified psychosocial needs    Expected Outcomes Short Term goal: Utilizing psychosocial counselor, staff and physician to assist with identification of specific Stressors or current issues interfering with healing process. Setting desired goal for each stressor or current  issue identified.;Long Term Goal: Stressors or current issues are controlled or eliminated.;Short Term goal: Identification and review with participant of any Quality of Life or Depression concerns found by scoring the questionnaire.;Long Term goal: The participant improves quality of Life and PHQ9 Scores as seen by post scores and/or verbalization of changes             Quality of Life Scores:  Quality of Life - 05/25/23 1614       Quality of Life   Select PAD/SET Quality of Life      PAD/SET Quality of Life Scores   Social Relationships and Interactions Pre 31    Self-Concepts and Feelings Pre 26    Symptoms and Limitations Pre 14    Fear and Uncertainty Pre 7    Positive Adaptation Pre 31    Job Pre 5    Sex Pre 5    Intimate Relationships Pre 5  Scores should be at or above lower SD. Change of 5 points or better indicates improvement in first 5 Factors. Maximum score for last three Factors is 6.  Score Interpretation Lower SD  Social Relationships and Interactions 23.99  Self-Concept and Feelings 17.79  Symptoms and Limitations 11.75  Fear and Uncertainty 8.96  Positive Adaptation 22.29  Job 2.08  Sexual Function 1.43  Intimate Mean 2.08   PHQ-9: Review Flowsheet  More data exists      05/25/2023 05/11/2023 12/25/2022 12/15/2022 07/18/2022  Depression screen PHQ 2/9  Decreased Interest 0 0 0 0 0  Down, Depressed, Hopeless 0 0 0 0 0  PHQ - 2 Score 0 0 0 0 0  Altered sleeping 0 - 0 0 -  Tired, decreased energy 0 - 0 0 -  Change in appetite 0 - 0 0 -  Feeling bad or failure about yourself  0 - 0 0 -  Trouble concentrating 0 - 0 0 -  Moving slowly or fidgety/restless 0 - 0 0 -  Suicidal thoughts 0 - 0 0 -  PHQ-9 Score 0 - 0 0 -  Difficult doing work/chores Not difficult at all - Not difficult at all Not difficult at all -   Interpretation of Total Score  Total Score Depression Severity:  1-4 = Minimal depression, 5-9 = Mild depression, 10-14 =  Moderate depression, 15-19 = Moderately severe depression, 20-27 = Severe depression   Psychosocial Evaluation and Intervention:  Psychosocial Evaluation - 05/18/23 1027       Psychosocial Evaluation & Interventions   Interventions Stress management education;Relaxation education;Encouraged to exercise with the program and follow exercise prescription    Comments Patient was referred to the PAD program. She is originally from [place] but has been in the [place] for 50+ years. She denies any depression, anxiety or stress and says she sleeps well. She has great support from her 4 children. She has a history of CAD with mutiple stents and CABG in 2022. She enjoys working in her flower garden but is not able to do a lot due to bilateral leg pain from both peripheral neuropathy and PAD. She has a treadmill but is not able to use it. She says Dr. Kirke Corin highly recommendends this program and she is hoping to improve the pain in her legs; improve her mobility overall; and to be able to do the things she enjoys doing. She has no barriers to paritcipate in the program.    Expected Outcomes Short Term: Patient will start the program and attend consistently. Long Term: Patient will complete the program meeting personal goals.    Continue Psychosocial Services  Follow up required by staff             Psychosocial Re-Evaluation:   Psychosocial Discharge (Final Psychosocial Re-Evaluation):   Vocational Rehabilitation: Provide vocational rehab assistance to qualifying candidates.   Vocational Rehab Evaluation & Intervention:  Vocational Rehab - 05/18/23 1026       Initial Vocational Rehab Evaluation & Intervention   Assessment shows need for Vocational Rehabilitation No      Vocational Rehab Re-Evaulation   Comments Retired.             Education: Education Goals: Education classes will be provided on a variety of topics geared toward better understanding of heart and vascular health and risk  factor modification. Participant will state understanding/return demonstration of topics presented as noted by education test scores.  Learning Barriers/Preferences:  Learning Barriers/Preferences - 05/18/23 1026  Learning Barriers/Preferences   Learning Barriers Reading   Patient states she is not able to ready English but her children help her with documentation.   Learning Preferences Audio;Skilled Demonstration;Pictoral             Education Topics: Count Your Pulse:  -Group instruction provided by verbal instruction, demonstration, patient participation and written materials to support subject.  Instructors address importance of being able to find your pulse and how to count your pulse when at home without a heart monitor.  Patients get hands on experience counting their pulse with staff help and individually.   Heart Attack, Angina, and Risk Factor Modification:  -Group instruction provided by verbal instruction, video, and written materials to support subject.  Instructors address signs and symptoms of angina and heart attacks.    Also discuss risk factors for heart disease and how to make changes to improve heart health risk factors.   Functional Fitness:  -Group instruction provided by verbal instruction, demonstration, patient participation, and written materials to support subject.  Instructors address safety measures for doing things around the house.  Discuss how to get up and down off the floor, how to pick things up properly, how to safely get out of a chair without assistance, and balance training.   Meditation and Mindfulness:  -Group instruction provided by verbal instruction, patient participation, and written materials to support subject.  Instructor addresses importance of mindfulness and meditation practice to help reduce stress and improve awareness.  Instructor also leads participants through a meditation exercise.    Stretching for Flexibility and  Mobility:  -Group instruction provided by verbal instruction, patient participation, and written materials to support subject.  Instructors lead participants through series of stretches that are designed to increase flexibility thus improving mobility.  These stretches are additional exercise for major muscle groups that are typically performed during regular warm up and cool down.   Hands Only CPR:  -Group verbal, video, and participation provides a basic overview of AHA guidelines for community CPR. Role-play of emergencies allow participants the opportunity to practice calling for help and chest compression technique with discussion of AED use.   Hypertension: -Group verbal and written instruction that provides a basic overview of hypertension including the most recent diagnostic guidelines, risk factor reduction with self-care instructions and medication management.    Nutrition I class: Heart Healthy Eating:  -Group instruction provided by PowerPoint slides, verbal discussion, and written materials to support subject matter. The instructor gives an explanation and review of the Therapeutic Lifestyle Changes diet recommendations, which includes a discussion on lipid goals, dietary fat, sodium, fiber, plant stanol/sterol esters, sugar, and the components of a well-balanced, healthy diet.   Nutrition II class: Lifestyle Skills:  -Group instruction provided by PowerPoint slides, verbal discussion, and written materials to support subject matter. The instructor gives an explanation and review of label reading, grocery shopping for heart health, heart healthy recipe modifications, and ways to make healthier choices when eating out.   Diabetes Question & Answer:  -Group instruction provided by PowerPoint slides, verbal discussion, and written materials to support subject matter. The instructor gives an explanation and review of diabetes co-morbidities, pre- and post-prandial blood glucose goals,  pre-exercise blood glucose goals, signs, symptoms, and treatment of hypoglycemia and hyperglycemia, and foot care basics.   Diabetes Blitz:  -Group instruction provided by PowerPoint slides, verbal discussion, and written materials to support subject matter. The instructor gives an explanation and review of the physiology behind type 1 and type  2 diabetes, diabetes medications and rational behind using different medications, pre- and post-prandial blood glucose recommendations and Hemoglobin A1c goals, diabetes diet, and exercise including blood glucose guidelines for exercising safely.    Portion Distortion:  -Group instruction provided by PowerPoint slides, verbal discussion, written materials, and food models to support subject matter. The instructor gives an explanation of serving size versus portion size, changes in portions sizes over the last 20 years, and what consists of a serving from each food group.   Stress Management:  -Group instruction provided by verbal instruction, video, and written materials to support subject matter.  Instructors review role of stress in heart disease and how to cope with stress positively.     Exercising on Your Own:  -Group instruction provided by verbal instruction, power point, and written materials to support subject.  Instructors discuss benefits of exercise, components of exercise, frequency and intensity of exercise, and end points for exercise.  Also discuss use of nitroglycerin and activating EMS.  Review options of places to exercise outside of rehab.  Review guidelines for sex with heart disease.   Cardiac Drugs I:  -Group instruction provided by verbal instruction and written materials to support subject.  Instructor reviews cardiac drug classes: antiplatelets, anticoagulants, beta blockers, and statins.  Instructor discusses reasons, side effects, and lifestyle considerations for each drug class.   Cardiac Drugs II:  -Group instruction  provided by verbal instruction and written materials to support subject.  Instructor reviews cardiac drug classes: angiotensin converting enzyme inhibitors (ACE-I), angiotensin II receptor blockers (ARBs), nitrates, and calcium channel blockers.  Instructor discusses reasons, side effects, and lifestyle considerations for each drug class.   Anatomy and Physiology of the Circulatory System:  Group verbal and written instruction and models provide basic cardiac anatomy and physiology, with the coronary electrical and arterial systems. Review of: AMI, Angina, Valve disease, Heart Failure, Peripheral Artery Disease, Cardiac Arrhythmia, Pacemakers, and the ICD.   Other Education:  -Group or individual verbal, written, or video instructions that support the educational goals of the cardiac rehab program.   Knowledge Questionnaire Score:  Knowledge Questionnaire Score - 05/25/23 1619       Knowledge Questionnaire Score   Pre Score 14/24             Core Components/Risk Factors/Patient Goals at Admission:  Personal Goals and Risk Factors at Admission - 05/25/23 1619       Core Components/Risk Factors/Patient Goals on Admission    Weight Management Obesity;Yes;Weight Loss    Intervention Weight Management: Develop a combined nutrition and exercise program designed to reach desired caloric intake, while maintaining appropriate intake of nutrient and fiber, sodium and fats, and appropriate energy expenditure required for the weight goal.;Weight Management: Provide education and appropriate resources to help participant work on and attain dietary goals.;Weight Management/Obesity: Establish reasonable short term and long term weight goals.;Obesity: Provide education and appropriate resources to help participant work on and attain dietary goals.    Admit Weight 206 lb (93.4 kg)    Goal Weight: Short Term 201 lb (91.2 kg)    Goal Weight: Long Term 196 lb (88.9 kg)    Expected Outcomes Short Term:  Continue to assess and modify interventions until short term weight is achieved;Long Term: Adherence to nutrition and physical activity/exercise program aimed toward attainment of established weight goal;Weight Loss: Understanding of general recommendations for a balanced deficit meal plan, which promotes 1-2 lb weight loss per week and includes a negative energy balance of [phone] kcal/d;Understanding  recommendations for meals to include 15-35% energy as protein, 25-35% energy from fat, 35-60% energy from carbohydrates, less than 200mg  of dietary cholesterol, 20-35 gm of total fiber daily;Understanding of distribution of calorie intake throughout the day with the consumption of 4-5 meals/snacks    Improve shortness of breath with ADL's Yes    Intervention Provide education, individualized exercise plan and daily activity instruction to help decrease symptoms of SOB with activities of daily living.    Expected Outcomes Short Term: Improve cardiorespiratory fitness to achieve a reduction of symptoms when performing ADLs;Long Term: Be able to perform more ADLs without symptoms or delay the onset of symptoms    Diabetes Yes    Intervention Provide education about signs/symptoms and action to take for hypo/hyperglycemia.;Provide education about proper nutrition, including hydration, and aerobic/resistive exercise prescription along with prescribed medications to achieve blood glucose in normal ranges: Fasting glucose 65-99 mg/dL    Expected Outcomes Short Term: Participant verbalizes understanding of the signs/symptoms and immediate care of hyper/hypoglycemia, proper foot care and importance of medication, aerobic/resistive exercise and nutrition plan for blood glucose control.;Long Term: Attainment of HbA1C < 7%.    Heart Failure Yes    Intervention Provide a combined exercise and nutrition program that is supplemented with education, support and counseling about heart failure. Directed toward relieving  symptoms such as shortness of breath, decreased exercise tolerance, and extremity edema.    Expected Outcomes Improve functional capacity of life;Short term: Attendance in program 2-3 days a week with increased exercise capacity. Reported lower sodium intake. Reported increased fruit and vegetable intake. Reports medication compliance.;Short term: Daily weights obtained and reported for increase. Utilizing diuretic protocols set by physician.;Long term: Adoption of self-care skills and reduction of barriers for early signs and symptoms recognition and intervention leading to self-care maintenance.    Hypertension Yes    Intervention Provide education on lifestyle modifcations including regular physical activity/exercise, weight management, moderate sodium restriction and increased consumption of fresh fruit, vegetables, and low fat dairy, alcohol moderation, and smoking cessation.;Monitor prescription use compliance.    Expected Outcomes Short Term: Continued assessment and intervention until BP is < 140/64mm HG in hypertensive participants. < 130/37mm HG in hypertensive participants with diabetes, heart failure or chronic kidney disease.;Long Term: Maintenance of blood pressure at goal levels.    Lipids Yes    Intervention Provide education and support for participant on nutrition & aerobic/resistive exercise along with prescribed medications to achieve LDL 70mg , HDL >40mg .    Expected Outcomes Short Term: Participant states understanding of desired cholesterol values and is compliant with medications prescribed. Participant is following exercise prescription and nutrition guidelines.;Long Term: Cholesterol controlled with medications as prescribed, with individualized exercise RX and with personalized nutrition plan. Value goals: LDL < 70mg , HDL > 40 mg.             Core Components/Risk Factors/Patient Goals Review:    Core Components/Risk Factors/Patient Goals at Discharge (Final Review):     ITP Comments:  ITP Comments     Row Name 05/18/23 1034 05/25/23 1601         ITP Comments Virtual orientation visit completed for the PAD program. On-site orientation visit scheduled for 05/20/23 at 1300. Documentation mailed and patient says her daughter helped her complete. Patient attend orientation today.  Patient is attending Supervised Exercise Therapy for PAD.  Documentation for diagnosis can be found in [facility] 04/28/23.  Reviewed medical chart, RPE/RPD, gym safety, and program guidelines.  Patient was fitted to equipment they  will be using during rehab.  Patient is scheduled to start exercise on Wednesday 05/27/23 at 1430.   Initial ITP created and sent for review and signature by Dr. Lorine Bears, referring physician.               Comments: Initial Plan of Care

## 2023-05-25 NOTE — Patient Instructions (Signed)
 Patient Instructions  Patient Details  Name: Leah Ferrell MRN: [id number] Date of Birth: Jun 26, 1950 Referring Provider:  Iran Ouch, MD  Below are your personal goals for exercise, nutrition, and risk factors. Our goal is to help you stay on track towards obtaining and maintaining these goals. We will be discussing your progress on these goals with you throughout the program.  Initial Exercise Prescription:  Initial Exercise Prescription - 05/25/23 1600       Date of Initial Exercise RX and Referring Provider   Date 05/25/23    Referring Provider Lorine Bears MD    Expected Discharge Date 08/14/23      Oxygen   Maintain Oxygen Saturation 88% or higher      Treadmill   MPH 1.7    Grade 0    Minutes 10    METs 2.3      Prescription Details   Frequency (times per week) 3    Duration Progress to 10 minutes continuous walking  at current work load and total walking time to 30-45 min      Intensity   THRR 40-80% of Max Heartrate 89-128    Ratings of Perceived Exertion 11-13    Perceived Dyspnea 0-4      Progression   Progression Continue to follow PAD protocol      Resistance Training   Training Prescription Yes    Weight 3 lb    Reps 10-15             Exercise Goals: Frequency: Be able to perform aerobic exercise two to three times per week in program working toward 2-5 days per week of home exercise.  Intensity: Work with a perceived exertion of 11 (fairly light) - 15 (hard) while following your exercise prescription.  We will make changes to your prescription with you as you progress through the program.   Duration: Be able to do 30 to 45 minutes of continuous aerobic exercise in addition to a 5 minute warm-up and a 5 minute cool-down routine.   Nutrition Goals: Your personal nutrition goals will be established when you do your nutrition analysis with the dietician.  The following are general nutrition guidelines to follow: Cholesterol <  200mg /day Sodium < 1500mg /day Fiber: Women over 50 yrs - 21 grams per day  Personal Goals:  Personal Goals and Risk Factors at Admission - 05/25/23 1619       Core Components/Risk Factors/Patient Goals on Admission    Weight Management Obesity;Yes;Weight Loss    Intervention Weight Management: Develop a combined nutrition and exercise program designed to reach desired caloric intake, while maintaining appropriate intake of nutrient and fiber, sodium and fats, and appropriate energy expenditure required for the weight goal.;Weight Management: Provide education and appropriate resources to help participant work on and attain dietary goals.;Weight Management/Obesity: Establish reasonable short term and long term weight goals.;Obesity: Provide education and appropriate resources to help participant work on and attain dietary goals.    Admit Weight 206 lb (93.4 kg)    Goal Weight: Short Term 201 lb (91.2 kg)    Goal Weight: Long Term 196 lb (88.9 kg)    Expected Outcomes Short Term: Continue to assess and modify interventions until short term weight is achieved;Long Term: Adherence to nutrition and physical activity/exercise program aimed toward attainment of established weight goal;Weight Loss: Understanding of general recommendations for a balanced deficit meal plan, which promotes 1-2 lb weight loss per week and includes a negative energy balance of [phone] kcal/d;Understanding recommendations  for meals to include 15-35% energy as protein, 25-35% energy from fat, 35-60% energy from carbohydrates, less than 200mg  of dietary cholesterol, 20-35 gm of total fiber daily;Understanding of distribution of calorie intake throughout the day with the consumption of 4-5 meals/snacks    Improve shortness of breath with ADL's Yes    Intervention Provide education, individualized exercise plan and daily activity instruction to help decrease symptoms of SOB with activities of daily living.    Expected Outcomes Short  Term: Improve cardiorespiratory fitness to achieve a reduction of symptoms when performing ADLs;Long Term: Be able to perform more ADLs without symptoms or delay the onset of symptoms    Diabetes Yes    Intervention Provide education about signs/symptoms and action to take for hypo/hyperglycemia.;Provide education about proper nutrition, including hydration, and aerobic/resistive exercise prescription along with prescribed medications to achieve blood glucose in normal ranges: Fasting glucose 65-99 mg/dL    Expected Outcomes Short Term: Participant verbalizes understanding of the signs/symptoms and immediate care of hyper/hypoglycemia, proper foot care and importance of medication, aerobic/resistive exercise and nutrition plan for blood glucose control.;Long Term: Attainment of HbA1C < 7%.    Heart Failure Yes    Intervention Provide a combined exercise and nutrition program that is supplemented with education, support and counseling about heart failure. Directed toward relieving symptoms such as shortness of breath, decreased exercise tolerance, and extremity edema.    Expected Outcomes Improve functional capacity of life;Short term: Attendance in program 2-3 days a week with increased exercise capacity. Reported lower sodium intake. Reported increased fruit and vegetable intake. Reports medication compliance.;Short term: Daily weights obtained and reported for increase. Utilizing diuretic protocols set by physician.;Long term: Adoption of self-care skills and reduction of barriers for early signs and symptoms recognition and intervention leading to self-care maintenance.    Hypertension Yes    Intervention Provide education on lifestyle modifcations including regular physical activity/exercise, weight management, moderate sodium restriction and increased consumption of fresh fruit, vegetables, and low fat dairy, alcohol moderation, and smoking cessation.;Monitor prescription use compliance.    Expected  Outcomes Short Term: Continued assessment and intervention until BP is < 140/2mm HG in hypertensive participants. < 130/55mm HG in hypertensive participants with diabetes, heart failure or chronic kidney disease.;Long Term: Maintenance of blood pressure at goal levels.    Lipids Yes    Intervention Provide education and support for participant on nutrition & aerobic/resistive exercise along with prescribed medications to achieve LDL 70mg , HDL >40mg .    Expected Outcomes Short Term: Participant states understanding of desired cholesterol values and is compliant with medications prescribed. Participant is following exercise prescription and nutrition guidelines.;Long Term: Cholesterol controlled with medications as prescribed, with individualized exercise RX and with personalized nutrition plan. Value goals: LDL < 70mg , HDL > 40 mg.             Tobacco Use Initial Evaluation: Social History   Tobacco Use  Smoking Status Former   Current packs/day: 0.00   Average packs/day: 1 pack/day for 36.8 years (36.8 ttl pk-yrs)   Types: Cigarettes   Start date: 04/21/1964   Quit date: 02/17/2001   Years since quitting: 22.2  Smokeless Tobacco Never    Exercise Goals and Review:  Exercise Goals     Row Name 05/25/23 1613             Exercise Goals   Increase Physical Activity Yes       Intervention Provide advice, education, support and counseling about physical activity/exercise needs.;Develop an individualized  exercise prescription for aerobic and resistive training based on initial evaluation findings, risk stratification, comorbidities and participant's personal goals.       Expected Outcomes Long Term: Exercising regularly at least 3-5 days a week.;Short Term: Attend rehab on a regular basis to increase amount of physical activity.;Long Term: Add in home exercise to make exercise part of routine and to increase amount of physical activity.       Increase Strength and Stamina Yes        Intervention Provide advice, education, support and counseling about physical activity/exercise needs.;Develop an individualized exercise prescription for aerobic and resistive training based on initial evaluation findings, risk stratification, comorbidities and participant's personal goals.       Expected Outcomes Short Term: Increase workloads from initial exercise prescription for resistance, speed, and METs.;Short Term: Perform resistance training exercises routinely during rehab and add in resistance training at home;Long Term: Improve cardiorespiratory fitness, muscular endurance and strength as measured by increased METs and functional capacity ( )       Able to understand and use rate of perceived exertion (RPE) scale Yes       Intervention Provide education and explanation on how to use RPE scale       Expected Outcomes Short Term: Able to use RPE daily in rehab to express subjective intensity level;Long Term:  Able to use RPE to guide intensity level when exercising independently       Able to understand and use Dyspnea scale Yes       Intervention Provide education and explanation on how to use Dyspnea scale       Expected Outcomes Long Term: Able to use Dyspnea scale to guide intensity level when exercising independently;Short Term: Able to use Dyspnea scale daily in rehab to express subjective sense of shortness of breath during exertion       Knowledge and understanding of Target Heart Rate Range (THRR) Yes       Intervention Provide education and explanation of THRR including how the numbers were predicted and where they are located for reference       Expected Outcomes Short Term: Able to state/look up THRR;Long Term: Able to use THRR to govern intensity when exercising independently;Short Term: Able to use daily as guideline for intensity in rehab       Able to check pulse independently Yes       Intervention Provide education and demonstration on how to check pulse in carotid and  radial arteries.;Review the importance of being able to check your own pulse for safety during independent exercise       Expected Outcomes Short Term: Able to explain why pulse checking is important during independent exercise;Long Term: Able to check pulse independently and accurately       Understanding of Exercise Prescription Yes       Intervention Provide education, explanation, and written materials on patient's individual exercise prescription       Expected Outcomes Short Term: Able to explain program exercise prescription;Long Term: Able to explain home exercise prescription to exercise independently       Improve claudication pain toleration; Improve walking ability Yes       Intervention Participate in PAD/SET Rehab 2-3 days a week, walking at home as part of exercise prescription;Attend education sessions to aid in risk factor modification and understanding of disease process       Expected Outcomes Short Term: Improve walking distance/time to onset of claudication pain;Long Term: Improve walking ability and toleration to  claudication;Long Term: Improve score of PAD questionnaires              Copy of goals given to participant.

## 2023-05-27 ENCOUNTER — Encounter (HOSPITAL_COMMUNITY)
Admission: RE | Admit: 2023-05-27 | Discharge: 2023-05-27 | Disposition: A | Discharge status: Home / Self Care | Source: Ambulatory Visit | Attending: Cardiovascular Disease | Admitting: Cardiovascular Disease

## 2023-05-27 DIAGNOSIS — I739 Peripheral vascular disease, unspecified: Secondary | ICD-10-CM

## 2023-05-27 DIAGNOSIS — Z955 Presence of coronary angioplasty implant and graft: Secondary | ICD-10-CM | POA: Diagnosis not present

## 2023-05-27 DIAGNOSIS — I70213 Atherosclerosis of native arteries of extremities with intermittent claudication, bilateral legs: Secondary | ICD-10-CM | POA: Diagnosis not present

## 2023-05-27 DIAGNOSIS — Z48812 Encounter for surgical aftercare following surgery on the circulatory system: Secondary | ICD-10-CM | POA: Diagnosis not present

## 2023-05-27 LAB — GLUCOSE, CAPILLARY
Glucose-Capillary: 219 mg/dL — ABNORMAL HIGH (ref 70–99)
Glucose-Capillary: 167 mg/dL — ABNORMAL HIGH (ref 70–99)

## 2023-05-27 NOTE — Progress Notes (Signed)
 Daily Session Note  Patient Details  Name: Leah Ferrell MRN: [id number] Date of Birth: March 31, 1950 Referring Provider:   Normajean Glasgow Row Supervised Exercise Therapy from 05/25/2023 in [facility]  Referring Provider Lorine Bears MD       Encounter Date: 05/27/2023  Check In:  Session Check In - 05/27/23 1400       Check-In   Supervising physician immediately available to respond to emergencies See telemetry face sheet for immediately available MD    Location [facility]    Staff Present Avanell Shackleton BSN, RN;Heather Fredric Mare, [place], Exercise Physiologist    Virtual Visit No    Medication changes reported     No    Fall or balance concerns reported    No    Tobacco Cessation No Change    Warm-up and Cool-down Performed on first and last piece of equipment    Resistance Training Performed Yes    VAD Patient? No    PAD/SET Patient? Yes      PAD/SET Patient   Completed foot check today? Yes    Open wounds to report? No      Pain Assessment   Currently in Pain? No/denies    Multiple Pain Sites No             Capillary Blood Glucose: Results for orders placed or performed during the hospital encounter of 05/27/23 (from the past 24 hours)  Glucose, capillary     Status: Abnormal   Collection Time: 05/27/23  2:06 PM  Result Value Ref Range   Glucose-Capillary 219 (H) 70 - 99 mg/dL      Social History   Tobacco Use  Smoking Status Former   Current packs/day: 0.00   Average packs/day: 1 pack/day for 36.8 years (36.8 ttl pk-yrs)   Types: Cigarettes   Start date: 04/21/1964   Quit date: 02/17/2001   Years since quitting: 22.2  Smokeless Tobacco Never    Goals Met:  Independence with exercise equipment Exercise tolerated well No report of concerns or symptoms today Strength training completed today  Goals Unmet:  Not Applicable  Comments: Marland KitchenMarland KitchenFirst full day of exercise!  Patient was oriented to gym and equipment including  functions, settings, policies, and procedures.  Patient's individual exercise prescription and treatment plan were reviewed.  All starting workloads were established based on the results of the 6 minute walk test done at initial orientation visit.  The plan for exercise progression was also introduced and progression will be customized based on patient's performance and goals.

## 2023-05-28 ENCOUNTER — Emergency Department (HOSPITAL_COMMUNITY)

## 2023-05-28 ENCOUNTER — Emergency Department (HOSPITAL_COMMUNITY)
Admission: EM | Admit: 2023-05-28 | Discharge: 2023-05-28 | Disposition: A | Discharge status: Home / Self Care | Attending: Emergency Medicine | Admitting: Emergency Medicine

## 2023-05-28 ENCOUNTER — Other Ambulatory Visit: Payer: Self-pay

## 2023-05-28 DIAGNOSIS — I10 Essential (primary) hypertension: Secondary | ICD-10-CM | POA: Diagnosis not present

## 2023-05-28 DIAGNOSIS — Z79899 Other long term (current) drug therapy: Secondary | ICD-10-CM | POA: Diagnosis not present

## 2023-05-28 DIAGNOSIS — R030 Elevated blood-pressure reading, without diagnosis of hypertension: Secondary | ICD-10-CM | POA: Diagnosis present

## 2023-05-28 DIAGNOSIS — Z794 Long term (current) use of insulin: Secondary | ICD-10-CM | POA: Diagnosis not present

## 2023-05-28 DIAGNOSIS — Z7982 Long term (current) use of aspirin: Secondary | ICD-10-CM | POA: Diagnosis not present

## 2023-05-28 DIAGNOSIS — R42 Dizziness and giddiness: Secondary | ICD-10-CM | POA: Diagnosis not present

## 2023-05-28 DIAGNOSIS — R6889 Other general symptoms and signs: Secondary | ICD-10-CM | POA: Diagnosis not present

## 2023-05-28 DIAGNOSIS — I672 Cerebral atherosclerosis: Secondary | ICD-10-CM | POA: Diagnosis not present

## 2023-05-28 DIAGNOSIS — Z743 Need for continuous supervision: Secondary | ICD-10-CM | POA: Diagnosis not present

## 2023-05-28 LAB — CBG MONITORING, ED: Glucose-Capillary: 204 mg/dL — ABNORMAL HIGH (ref 70–99)

## 2023-05-28 MED ORDER — LISINOPRIL 10 MG PO TABS
20.0000 mg | ORAL_TABLET | Freq: Once | ORAL | Status: AC
  Administered 2023-05-28: 20 mg via ORAL
  Filled 2023-05-28: qty 2

## 2023-05-28 NOTE — ED Provider Notes (Signed)
 Patient's care assumed by me at 7 PM.  Patient is here for hypertension and dizziness.  Patient reports that her blood pressure has been running high every night.  She has been taking extra medicines so that her blood pressure will not be as high.  Patient complains of experiencing some dizziness. Patient is pending results of CT scan. CT scan shows no acute findings.  Patient remains hypertensive she is not taking her evening medications yet.  I advised her to take her evening medicines that she is advised to follow-up with her primary care physician for recheck.  Patient is discharged in stable condition.   Cassidee Deats K, PA-C 05/28/23 2236    Cheyenne Cotta, MD 05/30/23 1101

## 2023-05-28 NOTE — ED Provider Notes (Signed)
 [place] EMERGENCY DEPARTMENT AT [facility] Provider Note   CSN: [id number] Arrival date & time: 05/28/23  1651     History Chief Complaint  Patient presents with   Hypertension    Leah Ferrell is a 73 y.o. female patient with history of hyperlipidemia, hypertension, and diabetes mellitus who presents to the emergency department today for further evaluation of elevated blood pressures at home.  Patient typically has blood pressures in the 150s to 160s systolic.  She has not taken her home medications.  She denies any focal weakness, focal numbness, chest pain or dizziness.  She does complain of some dizziness primarily when standing up.   Hypertension       Home Medications Prior to Admission medications   Medication Sig Start Date End Date Taking? Authorizing Provider  acetaminophen (TYLENOL) 325 MG tablet Take 2 tablets (650 mg total) by mouth every 6 (six) hours as needed for mild pain (or Fever >/= 101). 12/13/18   Colin Dawley, MD  albuterol (VENTOLIN HFA) 108 (90 Base) MCG/ACT inhaler INHALE 1 TO 2 PUFFS BY MOUTH EVERY 6 HOURS AS NEEDED FOR WHEEZING AND FOR SHORTNESS OF BREATH 03/10/23   Tommas Fragmin A, FNP  amLODipine (NORVASC) 10 MG tablet Take 1 tablet (10 mg total) by mouth daily. 04/11/23 07/10/23  Hoyt Macleod, MD  Ascorbic Acid (VITAMIN C) 1000 MG tablet Take 1,000 mg by mouth 3 (three) times daily.     [provider]  aspirin EC 81 MG tablet Take 81 mg by mouth daily. In the morning    [provider]  BD ULTRA-FINE PEN NEEDLES 29G X 12.7MM MISC USE PENNEEDLES 3 TIMES  DAILY AS DIRECTED 06/17/18   Yevette Hem, FNP  blood glucose meter kit and supplies Dispense based on patient and insurance preference. Use up to four times daily as directed. (FOR ICD-10 E10.9, E11.9). 12/19/19   Tommas Fragmin A, FNP  Calcium Carbonate-Vit D-Min (CALTRATE 600+D PLUS MINERALS) 600-800 MG-UNIT TABS Take 1 tablet by mouth 3 (three) times daily.      [provider]  carvedilol (COREG) 25 MG tablet Take 1 tablet (25 mg total) by mouth 2 (two) times daily with a meal. 04/10/23 07/09/23  Dahal, Aminta Baldy, MD  Chlorpheniramine-Acetaminophen (CORICIDIN HBP COLD/FLU PO) Take 1 tablet by mouth daily as needed (cough/cold).    [provider]  Cholecalciferol (VITAMIN D-3) 125 MCG (5000 UT) TABS Take 5,000 Units by mouth daily.    [provider]  clopidogrel (PLAVIX) 75 MG tablet Take 1 tablet by mouth once daily 05/12/23   Tommas Fragmin A, FNP  fish oil-omega-3 fatty acids 1000 MG capsule Take 1 g by mouth daily.     [provider]  glucose blood (GLUCOSE METER TEST) test strip Use 6-10 times a day, Uses ReliOn 12/19/19   Tommas Fragmin A, FNP  hydroxypropyl methylcellulose (ISOPTO TEARS) 2.5 % ophthalmic solution Place 1 drop into both eyes 3 (three) times daily as needed (for dry eyes).    [provider]  insulin degludec (TRESIBA FLEXTOUCH) 100 UNIT/ML FlexTouch Pen Inject 15-30 Units into the skin daily. Patient taking differently: Inject 20 Units into the skin daily. 09/09/22   Tommas Fragmin A, FNP  insulin lispro (HUMALOG KWIKPEN) 200 UNIT/ML KwikPen Inject 50 Units into the skin 3 (three) times daily before meals. 02/06/23   Yevette Hem, FNP  Insulin Syringe 27G X 1/2" 0.5 ML MISC Use with Fiasp insulin TID 04/02/20   Tommas Fragmin  A, FNP  lisinopril (ZESTRIL) 20 MG tablet Take 1 tablet by mouth twice daily 04/02/23   Tommas Fragmin A, FNP  montelukast (SINGULAIR) 10 MG tablet Take 1 tablet by mouth once daily with breakfast 05/05/23   Tommas Fragmin A, FNP  nitroGLYCERIN (NITROSTAT) 0.4 MG SL tablet Place 1 tablet (0.4 mg total) under the tongue every 5 (five) minutes as needed for chest pain. 04/10/23   Hoyt Macleod, MD  oxybutynin (DITROPAN-XL) 5 MG 24 hr tablet Take 1 tablet (5 mg total) by mouth at bedtime. 05/21/23   Tommas Fragmin A, FNP  pantoprazole (PROTONIX) 40 MG tablet Take 1 tablet (40  mg total) by mouth daily. 05/08/23 06/07/23  Tommas Fragmin A, FNP  RELION PEN NEEDLES 29G X MISC USE 1 NEEDLE TO INJECT INSULIN SUBCUTANEOUSLY 3 TIMES DAILY Patient taking differently: 1 Device by Subconjunctival route 3 (three) times daily. 01/31/16   Yevette Hem, FNP  rosuvastatin (CRESTOR) 40 MG tablet Take 1 tablet by mouth once daily 03/16/23   Tommas Fragmin A, FNP  Semaglutide, 2 MG/DOSE, (OZEMPIC, 2 MG/DOSE,) 8 MG/3ML SOPN Inject 2 mg into the skin once a week. 09/09/22   Tommas Fragmin A, FNP  spironolactone (ALDACTONE) 25 MG tablet Take 1 tablet (25 mg total) by mouth daily. 04/11/23 07/10/23  Hoyt Macleod, MD      Allergies    Tape    Review of Systems   Review of Systems  All other systems reviewed and are negative.   Physical Exam Updated Vital Signs BP (!) 138/54   Pulse (!) 59   Temp 98.8 F (37.1 C) (Oral)   Resp (!) 23   Ht 5\' 2"  (1.575 m)   Wt 93 kg   SpO2 94%   BMI 37.49 kg/m  Physical Exam Vitals and nursing note reviewed.  Constitutional:      General: She is not in acute distress.    Appearance: Normal appearance.  HENT:     Head: Normocephalic and atraumatic.  Eyes:     General:        Right eye: No discharge.        Left eye: No discharge.  Cardiovascular:     Comments: Regular rate and rhythm.  S1/S2 are distinct without any evidence of murmur, rubs, or gallops.  Radial pulses are 2+ bilaterally.  Dorsalis pedis pulses are 2+ bilaterally.  No evidence of pedal edema. Pulmonary:     Comments: Clear to auscultation bilaterally.  Normal effort.  No respiratory distress.  No evidence of wheezes, rales, or rhonchi heard throughout. Abdominal:     General: Abdomen is flat. Bowel sounds are normal. There is no distension.     Tenderness: There is no abdominal tenderness. There is no guarding or rebound.  Musculoskeletal:        General: Normal range of motion.     Cervical back: Neck supple.  Skin:    General: Skin is warm and dry.      Findings: No rash.  Neurological:     General: No focal deficit present.     Mental Status: She is alert.     Comments: Cranial nerves II to XII are intact.  5/5 strength to the upper and lower extremities.  Normal sensation to the upper and lower extremities.  Extraocular movements are intact without any obvious nystagmus.  Psychiatric:        Mood and Affect: Mood normal.        Behavior: Behavior normal.  ED Results / Procedures / Treatments   Labs (all labs ordered are listed, but only abnormal results are displayed) Labs Reviewed  CBG MONITORING, ED - Abnormal; Notable for the following components:      Result Value   Glucose-Capillary 204 (*)    All other components within normal limits    EKG None  Radiology No results found.  Procedures Procedures    Medications Ordered in ED Medications  lisinopril (ZESTRIL) tablet 20 mg (has no administration in time range)    ED Course/ Medical Decision Making/ A&P   {   Click here for ABCD2, HEART and other calculators  Medical Decision Making Leah Ferrell is a 73 y.o. female patient who presents to the emergency department today for further evaluation of elevated blood pressures and dizziness.  I have a low suspicion for any central cause.  Patient is mildly bradycardic which could be contributing to dizziness upon standing.  Will likely just get a CT scan to further assess.  I have a low suspicion for stroke at this time.  Patient's blood pressure is improving after giving her lisinopril.  Due to shift change, the rest of her care will be transferred to oncoming provider.  As long as CT scan is negative I do feel the patient would likely be able to be discharged.  Ultimate disposition to be made by oncoming provider.   Amount and/or Complexity of Data Reviewed Radiology: ordered.  Risk Prescription drug management.    Final Clinical Impression(s) / ED Diagnoses Final diagnoses:  None    Rx / DC  Orders ED Discharge Orders     None         Darletta Ehrich, [place] 05/28/23 1853    Cheyenne Cotta, MD 05/30/23 1101

## 2023-05-28 NOTE — Discharge Instructions (Addendum)
 See your Cardiologist for recheck.  Take all your medications as scheduled.  You were given 20 mg of lisinopril here

## 2023-05-28 NOTE — ED Triage Notes (Signed)
 Pt arrived via [facility] c/o HTN, states BP was 208/90 at home, she did take her BP meds today and recently gotten the dosage increased by her doctor, has already taken her first dose today. In triage 163/46 and pulse 50. Denies CP denies dizziness, endorses recent heart attack in feb and stent placement

## 2023-05-29 ENCOUNTER — Ambulatory Visit: Payer: Self-pay

## 2023-05-29 ENCOUNTER — Telehealth: Payer: Self-pay | Admitting: Family

## 2023-05-29 ENCOUNTER — Encounter (HOSPITAL_COMMUNITY)
Admission: RE | Admit: 2023-05-29 | Discharge: 2023-05-29 | Disposition: A | Discharge status: Home / Self Care | Source: Ambulatory Visit | Attending: Cardiovascular Disease | Admitting: Cardiovascular Disease

## 2023-05-29 DIAGNOSIS — I739 Peripheral vascular disease, unspecified: Secondary | ICD-10-CM

## 2023-05-29 LAB — GLUCOSE, CAPILLARY: Glucose-Capillary: 197 mg/dL — ABNORMAL HIGH (ref 70–99)

## 2023-05-29 NOTE — Telephone Encounter (Signed)
Appt made patient aware  °

## 2023-05-29 NOTE — Telephone Encounter (Signed)
 Chief Complaint: High BP Symptoms: None Frequency: Onset yesterday went to ED Pertinent Negatives: Patient denies other symptoms Disposition: [] ED /[] Urgent Care (no appt availability in office) / [x] Appointment(In office/virtual)/ []  [place] [facility]/ [] Home Care/ [] Refused Recommended Disposition /[] [place] [facility]/ []  Follow-up with PCP Additional Notes: Patient called and said she went to the ED last night due to high BP and she's wondering if she should take her BP pills this morning. Her BP around 0935 161/53. Advised to take her morning BP medications and that I will schedule a HFU visit for next week, however no available appointments until 06/12/23. She says she has rehab on Mon-Wed-Fri, so on Tue-Thur she's free. Scheduled Tuesday 06/13/23 for HFU and advised if the provider could see her sooner, someone will call with the new date/time. She verbalized understanding.   Copied from CRM [id number]. Topic: Clinical - Red Word Triage >> May 29, 2023  9:55 AM Elle L wrote: Red Word that prompted transfer to Nurse Triage: The patient requested to speak to a Nurse. She states that she went to the hospital for her high blood pressure yesterday. Her blood pressure went down to 108/54. The patient states that her blood pressure is now 161/53 and she is requesting to see if she should take all of her blood pressure medication. Reason for Disposition  [1] Systolic BP  >= 130 OR Diastolic >= 80 AND [2] taking BP medications  Answer Assessment - Initial Assessment Questions 1. BLOOD PRESSURE: "What is the blood pressure?" "Did you take at least two measurements 5 minutes apart?"     161/53 2. ONSET: "When did you take your blood pressure?"     This morning around 0935 3. HOW: "How did you take your blood pressure?" (e.g., automatic home BP monitor, visiting nurse)     Automatic home BP 4. HISTORY: "Do you have a history of high blood pressure?"     Yes 5. MEDICINES: "Are you taking any  medicines for blood pressure?" "Have you missed any doses recently?"     Yes 6. OTHER SYMPTOMS: "Do you have any symptoms?" (e.g., blurred vision, chest pain, difficulty breathing, headache, weakness)     No  Protocols used: Blood Pressure - High-A-AH

## 2023-05-29 NOTE — Telephone Encounter (Signed)
 Can Neysa Bonito work patient in sooner?   Copied from CRM [id number]. Topic: Appointments - Scheduling Inquiry for Clinic >> May 29, 2023  3:52 PM Abundio Miu S wrote: Reason for CRM: Patient requesting an earlier appointment for her hospital follow-up(scheduled for 06/16/23) so that she can continue her exercise therapy appointments.

## 2023-05-29 NOTE — Progress Notes (Signed)
 Incomplete Session Note  Patient Details  Name: Leah Ferrell MRN: [id number] Date of Birth: May 15, 1950 Referring Provider:   Normajean Glasgow Row Supervised Exercise Therapy from 05/25/2023 in [facility] CARDIAC REHABILITATION  Referring Provider Lorine Bears MD       [name] did not complete her rehab session.  Patient informed staff that she was in the ED yesterday with her systolic blood pressure 200 and she had dizziness. There were no acute abnormal findings in the ED. She was told to follow up with her PCP. She was not able to get an appointment with them until 4/29. She stated the dizziness had subsided. Her blood pressure was 148/60 which is higher than usual. We informed her that she needed medical clearance before resuming exercise. She verbalized understanding and was going to try and get an earlier appointment with her PCP. I sent a message to Dr. Kirke Corin to request he review her ED notes and give clearance for her to return due to her not being able to make up missed appointments. He said from his standpoint, she was okay to return to the program. Patient will be notified.

## 2023-06-01 ENCOUNTER — Encounter (HOSPITAL_COMMUNITY)

## 2023-06-01 ENCOUNTER — Ambulatory Visit: Payer: Self-pay | Admitting: *Deleted

## 2023-06-01 NOTE — Telephone Encounter (Signed)
 Copied from CRM [id number]. Topic: Clinical - Red Word Triage >> Jun 01, 2023 11:23 AM Crispin Dolphin wrote: Red Word that prompted transfer to Nurse Triage: Wheezing, problems with chest and lungs Reason for Disposition  [1] MODERATE longstanding difficulty breathing (e.g., speaks in phrases, SOB even at rest, pulse 100-120) AND [2] SAME as normal  Answer Assessment - Initial Assessment Questions 1. RESPIRATORY STATUS: "Describe your breathing?" (e.g., wheezing, shortness of breath, unable to speak, severe coughing)      I'm wheezing.  I don't have pain.   I'm running fever for a couple of nights.   I'm supposed to see Sundance tomorrow at 3:25.  I'm taking Tylenol.  I'm weak and light headed.   I think something is wrong with my lungs.     2. ONSET: "When did this breathing problem begin?"      At night while sleeping.   I can't breath so good.   I checked my oxygen is 95%.   I feel like something is wrong with my lungs.   My right side feels like there is mucus there. 3. PATTERN "Does the difficult breathing come and go, or has it been constant since it started?"      I'm not really short of breath.   I'm wheezing. 4. SEVERITY: "How bad is your breathing?" (e.g., mild, moderate, severe)    - MILD: No SOB at rest, mild SOB with walking, speaks normally in sentences, can lie down, no retractions, pulse < 100.    - MODERATE: SOB at rest, SOB with minimal exertion and prefers to sit, cannot lie down flat, speaks in phrases, mild retractions, audible wheezing, pulse 100-120.    - SEVERE: Very SOB at rest, speaks in single words, struggling to breathe, sitting hunched forward, retractions, pulse > 120      Mild 5. RECURRENT SYMPTOM: "Have you had difficulty breathing before?" If Yes, ask: "When was the last time?" and "What happened that time?"      Yes     6. CARDIAC HISTORY: "Do you have any history of heart disease?" (e.g., heart attack, angina, bypass surgery, angioplasty)      I had a heart attack  and had 3 stents put in in Feb. 2025. 7. LUNG HISTORY: "Do you have any history of lung disease?"  (e.g., pulmonary embolus, asthma, emphysema)     Yes I'm not feeling too good. 8. CAUSE: "What do you think is causing the breathing problem?"      When I lay on my right side at night I make a gurgle sound. 9. OTHER SYMPTOMS: "Do you have any other symptoms? (e.g., dizziness, runny nose, cough, chest pain, fever)     I have a lot of mucus and it's hard to clear it out. 10. O2 SATURATION MONITOR:  "Do you use an oxygen saturation monitor (pulse oximeter) at home?" If Yes, ask: "What is your reading (oxygen level) today?" "What is your usual oxygen saturation reading?" (e.g., 95%)       95% 11. PREGNANCY: "Is there any chance you are pregnant?" "When was your last menstrual period?"       N/A due to age 34. TRAVEL: "Have you traveled out of the country in the last month?" (e.g., travel history, exposures)       N/A  Protocols used: Breathing Difficulty-A-AH  Chief Complaint: Wheezing especially at night    (Pt has an appt for 4/15 at 3:40 with Tommas Fragmin, FNP but requesting to be seen  today). Symptoms: Hears a gurgling sound in her right lung at night.  Had fever for the last couple of days.  Frequency: At night Pertinent Negatives: Patient denies shortness of breath Disposition: [] ED /[] Urgent Care (no appt availability in office) / [x] Appointment(In office/virtual)/ []  [place] [facility]/ [] Home Care/ [] Refused Recommended Disposition /[] [place] [facility]/ []  Follow-up with PCP Additional Notes: Pt requesting to be seen today if possible as a walk in.  I let her know the practice does not do walk ins.   I called and checked with Edwina Gram at the practice because pt was insisting she could just walk in and be seen.    They do not do walk ins.   Must have an appt.   There are no openings today with any of the providers even Lugenia Said, NP who is the Doctor of the Day.    The line got  disconnected.   I tried calling pt back but left a voicemail to call back.  Another triage nurse is talking with her.

## 2023-06-01 NOTE — Telephone Encounter (Signed)
 Pt. Called back and given her appointment for tomorrow. Verbalizes understanding.

## 2023-06-02 ENCOUNTER — Encounter: Payer: Self-pay | Admitting: Family

## 2023-06-02 ENCOUNTER — Ambulatory Visit: Admitting: Family

## 2023-06-02 VITALS — BP 135/70 | HR 49 | Temp 98.1°F

## 2023-06-02 DIAGNOSIS — I5033 Acute on chronic diastolic (congestive) heart failure: Secondary | ICD-10-CM | POA: Diagnosis not present

## 2023-06-02 DIAGNOSIS — Z09 Encounter for follow-up examination after completed treatment for conditions other than malignant neoplasm: Secondary | ICD-10-CM

## 2023-06-02 DIAGNOSIS — I152 Hypertension secondary to endocrine disorders: Secondary | ICD-10-CM

## 2023-06-02 DIAGNOSIS — E1151 Type 2 diabetes mellitus with diabetic peripheral angiopathy without gangrene: Secondary | ICD-10-CM | POA: Diagnosis not present

## 2023-06-02 DIAGNOSIS — E1159 Type 2 diabetes mellitus with other circulatory complications: Secondary | ICD-10-CM

## 2023-06-02 MED ORDER — SPIRONOLACTONE 25 MG PO TABS: 25.0000 mg | ORAL_TABLET | Freq: Every day | ORAL | 2 refills | Status: DC

## 2023-06-02 MED ORDER — CARVEDILOL 25 MG PO TABS: 25.0000 mg | ORAL_TABLET | Freq: Two times a day (BID) | ORAL | 2 refills | Status: DC

## 2023-06-02 NOTE — Progress Notes (Signed)
 Subjective:    Patient ID: Leah Ferrell, female    DOB: 1950/06/25, 73 y.o.   MRN: [id number]  Chief Complaint  Patient presents with   Hospitalization Follow-up   Pt presents to the office today for hospital follow up. She went to the ED for hypertension and dizziness.  She is taking Norvasc 10 mg, Coreg 25 mg BID, and spironolactone 25 mg.   Doing PT TID a week.      05/28/2023    5:21 PM 05/25/2023    4:14 PM 05/11/2023   10:59 AM  Last 3 Weights  Weight (lbs) 205 lb 206 lb 202 lb 12.8 oz  Weight (kg) 92.987 kg 93.441 kg 91.989 kg     Hypertension This is a chronic problem. The current episode started more than 1 year ago. The problem has been resolved since onset. The problem is controlled. Associated symptoms include malaise/fatigue, peripheral edema and shortness of breath. Pertinent negatives include no blurred vision or sweats. Risk factors for coronary artery disease include dyslipidemia, diabetes mellitus, obesity and sedentary lifestyle. Past treatments include beta blockers, calcium channel blockers and diuretics. The current treatment provides mild improvement. Compliance problems include diet.   Diabetes She presents for her follow-up diabetic visit. She has type 2 diabetes mellitus. Pertinent negatives for hypoglycemia include no sweats. Associated symptoms include foot paresthesias. Pertinent negatives for diabetes include no blurred vision. Risk factors for coronary artery disease include diabetes mellitus, dyslipidemia, hypertension, sedentary lifestyle and post-menopausal. She is following a generally unhealthy diet. Her overall blood glucose range is 140-180 mg/dl.      Review of Systems  Constitutional:  Positive for malaise/fatigue.  Eyes:  Negative for blurred vision.  Respiratory:  Positive for shortness of breath.   All other systems reviewed and are negative.   Social History   Socioeconomic History   Marital status: Widowed    Spouse name: Not on  file   Number of children: 4   Years of education: completed school in [place]    Highest education level: Not on file  Occupational History   Occupation: retired  Tobacco Use   Smoking status: Former    Current packs/day: 0.00    Average packs/day: 1 pack/day for 36.8 years (36.8 ttl pk-yrs)    Types: Cigarettes    Start date: 04/21/1964    Quit date: 02/17/2001    Years since quitting: 22.3   Smokeless tobacco: Never  Vaping Use   Vaping status: Never Used  Substance and Sexual Activity   Alcohol use: No    Alcohol/week: 0.0 standard drinks of alcohol   Drug use: No   Sexual activity: Not Currently    Birth control/protection: Post-menopausal  Other Topics Concern   Not on file  Social History Narrative   Lives alone - one level - all of her children live far away   Social Drivers of Health   Financial Resource Strain: Low Risk  (12/25/2022)   Overall Financial Resource Strain (CARDIA)    Difficulty of Paying Living Expenses: Not hard at all  Food Insecurity: No Food Insecurity (04/06/2023)   Hunger Vital Sign    Worried About Running Out of Food in the Last Year: Never true    Ran Out of Food in the Last Year: Never true  Transportation Needs: No Transportation Needs (04/06/2023)   PRAPARE - Administrator, Civil Service (Medical): No    Lack of Transportation (Non-Medical): No  Physical Activity: Insufficiently Active (12/25/2022)  Exercise Vital Sign    Days of Exercise per Week: 3 days    Minutes of Exercise per Session: 30 min  Stress: No Stress Concern Present (12/25/2022)   [employer] of Occupational Health - Occupational Stress Questionnaire    Feeling of Stress : Not at all  Social Connections: Moderately Isolated (04/06/2023)   Social Connection and Isolation Panel [NHANES]    Frequency of Communication with Friends and Family: More than three times a week    Frequency of Social Gatherings with Friends and Family: More than three times a week     Attends Religious Services: 1 to 4 times per year    Active Member of [employer] or Organizations: No    Attends Banker Meetings: Never    Marital Status: Widowed   Family History  Problem Relation Age of Onset   Diabetes Mother    Hyperlipidemia Mother    Hypertension Mother    Peripheral vascular disease Mother    Other Sister        drowned   Other Sister        drowned   Diabetes Daughter        borderline   Hypertension Daughter    Diabetes Maternal Grandmother    Heart attack Maternal Grandfather    Heart disease Brother    Diabetes Brother    Alcohol abuse Brother    Diabetes Son    Hypertension Son    Hyperlipidemia Son    Breast cancer Neg Hx         Objective:   Physical Exam Vitals reviewed.  Constitutional:      General: She is not in acute distress.    Appearance: She is well-developed. She is obese.  HENT:     Head: Normocephalic and atraumatic.     Right Ear: Tympanic membrane normal.     Left Ear: Tympanic membrane normal.  Eyes:     Pupils: Pupils are equal, round, and reactive to light.  Neck:     Thyroid: No thyromegaly.  Cardiovascular:     Rate and Rhythm: Normal rate and regular rhythm.     Heart sounds: Normal heart sounds. No murmur heard. Pulmonary:     Effort: Pulmonary effort is normal. No respiratory distress.     Breath sounds: Normal breath sounds. No wheezing.  Abdominal:     General: Bowel sounds are normal. There is no distension.     Palpations: Abdomen is soft.     Tenderness: There is no abdominal tenderness.  Musculoskeletal:        General: No tenderness. Normal range of motion.     Cervical back: Normal range of motion and neck supple.  Skin:    General: Skin is warm and dry.  Neurological:     Mental Status: She is alert and oriented to person, place, and time.     Cranial Nerves: No cranial nerve deficit.     Deep Tendon Reflexes: Reflexes are normal and symmetric.  Psychiatric:        Behavior:  Behavior normal.        Thought Content: Thought content normal.        Judgment: Judgment normal.       BP 135/70 Comment: pt reports at home  Pulse (!) 49   Temp 98.1 F (36.7 C) (Temporal)   SpO2 97%      Assessment & Plan:  Leah Ferrell comes in today with chief complaint of Hospitalization Follow-up  Diagnosis and orders addressed:  1. Hypertension associated with diabetes (HCC) (Primary) BP stable today -Daily blood pressure log given with instructions on how to fill out and told to bring to next visit -Dash diet information given -Exercise encouraged - Stress Management  -Continue current meds -RTO in keep chronic follow up - carvedilol (COREG) 25 MG tablet; Take 1 tablet (25 mg total) by mouth 2 (two) times daily with a meal.  Dispense: 180 tablet; Refill: 2 - spironolactone (ALDACTONE) 25 MG tablet; Take 1 tablet (25 mg total) by mouth daily.  Dispense: 90 tablet; Refill: 2 - CMP14+EGFR  2. Acute on chronic diastolic CHF (congestive heart failure) (HCC) stable - CMP14+EGFR  3. Diabetes mellitus with peripheral vascular disease (HCC) Ozempic sample given Patient assistance pending  - CMP14+EGFR  4. Hospital discharge follow-up Hospital notes reviewed  - CMP14+EGFR   Labs pending Continue current medications  Keep follow up with specialists  Health Maintenance reviewed Diet and exercise encouraged  Return if symptoms worsen or fail to improve.    Tommas Fragmin, FNP

## 2023-06-02 NOTE — Patient Instructions (Signed)
 Hypertension, Adult High blood pressure (hypertension) is when the force of blood pumping through the arteries is too strong. The arteries are the blood vessels that carry blood from the heart throughout the body. Hypertension forces the heart to work harder to pump blood and may cause arteries to become narrow or stiff. Untreated or uncontrolled hypertension can lead to a heart attack, heart failure, a stroke, kidney disease, and other problems. A blood pressure reading consists of a higher number over a lower number. Ideally, your blood pressure should be below 120/80. The first ("top") number is called the systolic pressure. It is a measure of the pressure in your arteries as your heart beats. The second ("bottom") number is called the diastolic pressure. It is a measure of the pressure in your arteries as the heart relaxes. What are the causes? The exact cause of this condition is not known. There are some conditions that result in high blood pressure. What increases the risk? Certain factors may make you more likely to develop high blood pressure. Some of these risk factors are under your control, including: Smoking. Not getting enough exercise or physical activity. Being overweight. Having too much fat, sugar, calories, or salt (sodium) in your diet. Drinking too much alcohol. Other risk factors include: Having a personal history of heart disease, diabetes, high cholesterol, or kidney disease. Stress. Having a family history of high blood pressure and high cholesterol. Having obstructive sleep apnea. Age. The risk increases with age. What are the signs or symptoms? High blood pressure may not cause symptoms. Very high blood pressure (hypertensive crisis) may cause: Headache. Fast or irregular heartbeats (palpitations). Shortness of breath. Nosebleed. Nausea and vomiting. Vision changes. Severe chest pain, dizziness, and seizures. How is this diagnosed? This condition is diagnosed by  measuring your blood pressure while you are seated, with your arm resting on a flat surface, your legs uncrossed, and your feet flat on the floor. The cuff of the blood pressure monitor will be placed directly against the skin of your upper arm at the level of your heart. Blood pressure should be measured at least twice using the same arm. Certain conditions can cause a difference in blood pressure between your right and left arms. If you have a high blood pressure reading during one visit or you have normal blood pressure with other risk factors, you may be asked to: Return on a different day to have your blood pressure checked again. Monitor your blood pressure at home for 1 week or longer. If you are diagnosed with hypertension, you may have other blood or imaging tests to help your health care provider understand your overall risk for other conditions. How is this treated? This condition is treated by making healthy lifestyle changes, such as eating healthy foods, exercising more, and reducing your alcohol intake. You may be referred for counseling on a healthy diet and physical activity. Your health care provider may prescribe medicine if lifestyle changes are not enough to get your blood pressure under control and if: Your systolic blood pressure is above 130. Your diastolic blood pressure is above 80. Your personal target blood pressure may vary depending on your medical conditions, your age, and other factors. Follow these instructions at home: Eating and drinking  Eat a diet that is high in fiber and potassium, and low in sodium, added sugar, and fat. An example of this eating plan is called the DASH diet. DASH stands for Dietary Approaches to Stop Hypertension. To eat this way: Eat  plenty of fresh fruits and vegetables. Try to fill one half of your plate at each meal with fruits and vegetables. Eat whole grains, such as whole-wheat pasta, brown rice, or whole-grain bread. Fill about one  fourth of your plate with whole grains. Eat or drink low-fat dairy products, such as skim milk or low-fat yogurt. Avoid fatty cuts of meat, processed or cured meats, and poultry with skin. Fill about one fourth of your plate with lean proteins, such as fish, chicken without skin, beans, eggs, or tofu. Avoid pre-made and processed foods. These tend to be higher in sodium, added sugar, and fat. Reduce your daily sodium intake. Many people with hypertension should eat less than 1,500 mg of sodium a day. Do not drink alcohol if: Your health care provider tells you not to drink. You are pregnant, may be pregnant, or are planning to become pregnant. If you drink alcohol: Limit how much you have to: 0-1 drink a day for women. 0-2 drinks a day for men. Know how much alcohol is in your drink. In the [place] one drink equals one 12 oz bottle of beer (355 mL), one 5 oz glass of wine (148 mL), or one 1 oz glass of hard liquor (44 mL). Lifestyle  Work with your health care provider to maintain a healthy body weight or to lose weight. Ask what an ideal weight is for you. Get at least 30 minutes of exercise that causes your heart to beat faster (aerobic exercise) most days of the week. Activities may include walking, swimming, or biking. Include exercise to strengthen your muscles (resistance exercise), such as Pilates or lifting weights, as part of your weekly exercise routine. Try to do these types of exercises for 30 minutes at least 3 days a week. Do not use any products that contain nicotine or tobacco. These products include cigarettes, chewing tobacco, and vaping devices, such as e-cigarettes. If you need help quitting, ask your health care provider. Monitor your blood pressure at home as told by your health care provider. Keep all follow-up visits. This is important. Medicines Take over-the-counter and prescription medicines only as told by your health care provider. Follow directions carefully. Blood  pressure medicines must be taken as prescribed. Do not skip doses of blood pressure medicine. Doing this puts you at risk for problems and can make the medicine less effective. Ask your health care provider about side effects or reactions to medicines that you should watch for. Contact a health care provider if you: Think you are having a reaction to a medicine you are taking. Have headaches that keep coming back (recurring). Feel dizzy. Have swelling in your ankles. Have trouble with your vision. Get help right away if you: Develop a severe headache or confusion. Have unusual weakness or numbness. Feel faint. Have severe pain in your chest or abdomen. Vomit repeatedly. Have trouble breathing. These symptoms may be an emergency. Get help right away. Call 911. Do not wait to see if the symptoms will go away. Do not drive yourself to the hospital. Summary Hypertension is when the force of blood pumping through your arteries is too strong. If this condition is not controlled, it may put you at risk for serious complications. Your personal target blood pressure may vary depending on your medical conditions, your age, and other factors. For most people, a normal blood pressure is less than 120/80. Hypertension is treated with lifestyle changes, medicines, or a combination of both. Lifestyle changes include losing weight, eating a healthy,  low-sodium diet, exercising more, and limiting alcohol. This information is not intended to replace advice given to you by your health care provider. Make sure you discuss any questions you have with your health care provider. Document Revised: 12/11/2020 Document Reviewed: 12/11/2020 Elsevier Patient Education  2024 [employer].

## 2023-06-03 ENCOUNTER — Encounter (HOSPITAL_COMMUNITY)
Admission: RE | Admit: 2023-06-03 | Discharge: 2023-06-03 | Disposition: A | Discharge status: Home / Self Care | Source: Ambulatory Visit | Attending: Cardiovascular Disease | Admitting: Cardiovascular Disease

## 2023-06-03 DIAGNOSIS — R0902 Hypoxemia: Secondary | ICD-10-CM | POA: Diagnosis not present

## 2023-06-03 DIAGNOSIS — Z7902 Long term (current) use of antithrombotics/antiplatelets: Secondary | ICD-10-CM | POA: Diagnosis not present

## 2023-06-03 DIAGNOSIS — Z961 Presence of intraocular lens: Secondary | ICD-10-CM | POA: Diagnosis not present

## 2023-06-03 DIAGNOSIS — J811 Chronic pulmonary edema: Secondary | ICD-10-CM | POA: Diagnosis not present

## 2023-06-03 DIAGNOSIS — Z79899 Other long term (current) drug therapy: Secondary | ICD-10-CM | POA: Diagnosis not present

## 2023-06-03 DIAGNOSIS — R918 Other nonspecific abnormal finding of lung field: Secondary | ICD-10-CM | POA: Diagnosis not present

## 2023-06-03 DIAGNOSIS — K219 Gastro-esophageal reflux disease without esophagitis: Secondary | ICD-10-CM | POA: Diagnosis not present

## 2023-06-03 DIAGNOSIS — R001 Bradycardia, unspecified: Secondary | ICD-10-CM | POA: Diagnosis not present

## 2023-06-03 DIAGNOSIS — E782 Mixed hyperlipidemia: Secondary | ICD-10-CM | POA: Diagnosis not present

## 2023-06-03 DIAGNOSIS — Z794 Long term (current) use of insulin: Secondary | ICD-10-CM | POA: Diagnosis not present

## 2023-06-03 DIAGNOSIS — Z8249 Family history of ischemic heart disease and other diseases of the circulatory system: Secondary | ICD-10-CM | POA: Diagnosis not present

## 2023-06-03 DIAGNOSIS — I2510 Atherosclerotic heart disease of native coronary artery without angina pectoris: Secondary | ICD-10-CM | POA: Diagnosis not present

## 2023-06-03 DIAGNOSIS — E1165 Type 2 diabetes mellitus with hyperglycemia: Secondary | ICD-10-CM | POA: Diagnosis not present

## 2023-06-03 DIAGNOSIS — Z9842 Cataract extraction status, left eye: Secondary | ICD-10-CM | POA: Diagnosis not present

## 2023-06-03 DIAGNOSIS — Z955 Presence of coronary angioplasty implant and graft: Secondary | ICD-10-CM | POA: Diagnosis not present

## 2023-06-03 DIAGNOSIS — I2489 Other forms of acute ischemic heart disease: Secondary | ICD-10-CM | POA: Diagnosis not present

## 2023-06-03 DIAGNOSIS — R0600 Dyspnea, unspecified: Secondary | ICD-10-CM | POA: Diagnosis not present

## 2023-06-03 DIAGNOSIS — Z7985 Long-term (current) use of injectable non-insulin antidiabetic drugs: Secondary | ICD-10-CM | POA: Diagnosis not present

## 2023-06-03 DIAGNOSIS — I110 Hypertensive heart disease with heart failure: Secondary | ICD-10-CM | POA: Diagnosis not present

## 2023-06-03 DIAGNOSIS — I5033 Acute on chronic diastolic (congestive) heart failure: Secondary | ICD-10-CM | POA: Diagnosis not present

## 2023-06-03 DIAGNOSIS — Z951 Presence of aortocoronary bypass graft: Secondary | ICD-10-CM | POA: Diagnosis not present

## 2023-06-03 DIAGNOSIS — D649 Anemia, unspecified: Secondary | ICD-10-CM | POA: Diagnosis not present

## 2023-06-03 DIAGNOSIS — I739 Peripheral vascular disease, unspecified: Secondary | ICD-10-CM

## 2023-06-03 DIAGNOSIS — E1151 Type 2 diabetes mellitus with diabetic peripheral angiopathy without gangrene: Secondary | ICD-10-CM | POA: Diagnosis not present

## 2023-06-03 DIAGNOSIS — J9601 Acute respiratory failure with hypoxia: Secondary | ICD-10-CM | POA: Diagnosis not present

## 2023-06-03 DIAGNOSIS — I252 Old myocardial infarction: Secondary | ICD-10-CM | POA: Diagnosis not present

## 2023-06-03 DIAGNOSIS — R0989 Other specified symptoms and signs involving the circulatory and respiratory systems: Secondary | ICD-10-CM | POA: Diagnosis not present

## 2023-06-03 DIAGNOSIS — R0609 Other forms of dyspnea: Secondary | ICD-10-CM | POA: Diagnosis not present

## 2023-06-03 DIAGNOSIS — Z87891 Personal history of nicotine dependence: Secondary | ICD-10-CM | POA: Diagnosis not present

## 2023-06-03 DIAGNOSIS — I509 Heart failure, unspecified: Secondary | ICD-10-CM | POA: Diagnosis not present

## 2023-06-03 LAB — GLUCOSE, CAPILLARY
Glucose-Capillary: 120 mg/dL — ABNORMAL HIGH (ref 70–99)
Glucose-Capillary: 71 mg/dL (ref 70–99)
Glucose-Capillary: 82 mg/dL (ref 70–99)

## 2023-06-03 NOTE — Progress Notes (Signed)
 Daily Session Note  Patient Details  Name: Leah Ferrell MRN: [id number] Date of Birth: 10-08-1950 Referring Provider:   Lorrane Rosette Row Supervised Exercise Therapy from 05/25/2023 in [facility] CARDIAC REHABILITATION  Referring Provider Antionette Kirks MD       Encounter Date: 06/03/2023  Check In:  Session Check In - 06/03/23 1405       Check-In   Supervising physician immediately available to respond to emergencies See telemetry face sheet for immediately available MD    Location [facility]    Staff Present Ronna Coho BSN, RN;Brittany Annette Barters, BSN, RN, WTA-C;Heather Toy Freund, BS, Exercise Physiologist    Virtual Visit No    Medication changes reported     No    Fall or balance concerns reported    No    Tobacco Cessation No Change    Warm-up and Cool-down Performed on first and last piece of equipment    Resistance Training Performed Yes    VAD Patient? No    PAD/SET Patient? Yes      PAD/SET Patient   Completed foot check today? Yes    Open wounds to report? No      Pain Assessment   Currently in Pain? No/denies    Pain Score 0-No pain    Multiple Pain Sites No             Capillary Blood Glucose: Results for orders placed or performed during the hospital encounter of 06/03/23 (from the past 24 hours)  Glucose, capillary     Status: Abnormal   Collection Time: 06/03/23  2:08 PM  Result Value Ref Range   Glucose-Capillary 120 (H) 70 - 99 mg/dL      Social History   Tobacco Use  Smoking Status Former   Current packs/day: 0.00   Average packs/day: 1 pack/day for 36.8 years (36.8 ttl pk-yrs)   Types: Cigarettes   Start date: 04/21/1964   Quit date: 02/17/2001   Years since quitting: 22.3  Smokeless Tobacco Never    Goals Met:  Independence with exercise equipment Exercise tolerated well No report of concerns or symptoms today Strength training completed today  Goals Unmet:  Not Applicable  Comments: Aaron AasAaron AasPt able to follow  exercise prescription today without complaint.  Will continue to monitor for progression.

## 2023-06-05 ENCOUNTER — Ambulatory Visit: Payer: Self-pay | Admitting: *Deleted

## 2023-06-05 ENCOUNTER — Encounter (HOSPITAL_COMMUNITY)

## 2023-06-05 ENCOUNTER — Telehealth: Payer: Self-pay | Admitting: Cardiovascular Disease

## 2023-06-05 ENCOUNTER — Encounter (HOSPITAL_COMMUNITY): Payer: Self-pay

## 2023-06-05 ENCOUNTER — Emergency Department (HOSPITAL_COMMUNITY)

## 2023-06-05 ENCOUNTER — Inpatient Hospital Stay (HOSPITAL_COMMUNITY)
Admission: EM | Admit: 2023-06-05 | Discharge: 2023-06-08 | DRG: 291 | Disposition: A | Discharge status: Home / Self Care | Attending: Family Medicine | Admitting: Family Medicine

## 2023-06-05 ENCOUNTER — Other Ambulatory Visit: Payer: Self-pay

## 2023-06-05 DIAGNOSIS — E1151 Type 2 diabetes mellitus with diabetic peripheral angiopathy without gangrene: Secondary | ICD-10-CM | POA: Diagnosis present

## 2023-06-05 DIAGNOSIS — Z8249 Family history of ischemic heart disease and other diseases of the circulatory system: Secondary | ICD-10-CM

## 2023-06-05 DIAGNOSIS — Z955 Presence of coronary angioplasty implant and graft: Secondary | ICD-10-CM | POA: Diagnosis not present

## 2023-06-05 DIAGNOSIS — Z961 Presence of intraocular lens: Secondary | ICD-10-CM | POA: Diagnosis present

## 2023-06-05 DIAGNOSIS — R0600 Dyspnea, unspecified: Secondary | ICD-10-CM | POA: Diagnosis not present

## 2023-06-05 DIAGNOSIS — Z951 Presence of aortocoronary bypass graft: Secondary | ICD-10-CM

## 2023-06-05 DIAGNOSIS — I5033 Acute on chronic diastolic (congestive) heart failure: Secondary | ICD-10-CM | POA: Diagnosis present

## 2023-06-05 DIAGNOSIS — I110 Hypertensive heart disease with heart failure: Secondary | ICD-10-CM | POA: Diagnosis not present

## 2023-06-05 DIAGNOSIS — E1165 Type 2 diabetes mellitus with hyperglycemia: Secondary | ICD-10-CM | POA: Insufficient documentation

## 2023-06-05 DIAGNOSIS — I499 Cardiac arrhythmia, unspecified: Secondary | ICD-10-CM | POA: Diagnosis not present

## 2023-06-05 DIAGNOSIS — K219 Gastro-esophageal reflux disease without esophagitis: Secondary | ICD-10-CM | POA: Diagnosis not present

## 2023-06-05 DIAGNOSIS — Z7902 Long term (current) use of antithrombotics/antiplatelets: Secondary | ICD-10-CM | POA: Diagnosis not present

## 2023-06-05 DIAGNOSIS — R7989 Other specified abnormal findings of blood chemistry: Secondary | ICD-10-CM | POA: Diagnosis present

## 2023-06-05 DIAGNOSIS — I509 Heart failure, unspecified: Principal | ICD-10-CM

## 2023-06-05 DIAGNOSIS — R001 Bradycardia, unspecified: Secondary | ICD-10-CM | POA: Diagnosis present

## 2023-06-05 DIAGNOSIS — R0902 Hypoxemia: Secondary | ICD-10-CM

## 2023-06-05 DIAGNOSIS — E782 Mixed hyperlipidemia: Secondary | ICD-10-CM | POA: Diagnosis present

## 2023-06-05 DIAGNOSIS — I2489 Other forms of acute ischemic heart disease: Secondary | ICD-10-CM | POA: Diagnosis present

## 2023-06-05 DIAGNOSIS — I10 Essential (primary) hypertension: Secondary | ICD-10-CM | POA: Diagnosis present

## 2023-06-05 DIAGNOSIS — Z811 Family history of alcohol abuse and dependence: Secondary | ICD-10-CM

## 2023-06-05 DIAGNOSIS — I5031 Acute diastolic (congestive) heart failure: Secondary | ICD-10-CM | POA: Diagnosis not present

## 2023-06-05 DIAGNOSIS — I152 Hypertension secondary to endocrine disorders: Secondary | ICD-10-CM

## 2023-06-05 DIAGNOSIS — Z794 Long term (current) use of insulin: Secondary | ICD-10-CM | POA: Diagnosis not present

## 2023-06-05 DIAGNOSIS — I252 Old myocardial infarction: Secondary | ICD-10-CM

## 2023-06-05 DIAGNOSIS — D649 Anemia, unspecified: Secondary | ICD-10-CM | POA: Diagnosis present

## 2023-06-05 DIAGNOSIS — R069 Unspecified abnormalities of breathing: Secondary | ICD-10-CM | POA: Diagnosis not present

## 2023-06-05 DIAGNOSIS — E66812 Obesity, class 2: Secondary | ICD-10-CM | POA: Diagnosis present

## 2023-06-05 DIAGNOSIS — I2510 Atherosclerotic heart disease of native coronary artery without angina pectoris: Secondary | ICD-10-CM | POA: Diagnosis not present

## 2023-06-05 DIAGNOSIS — J811 Chronic pulmonary edema: Secondary | ICD-10-CM | POA: Diagnosis not present

## 2023-06-05 DIAGNOSIS — J9601 Acute respiratory failure with hypoxia: Secondary | ICD-10-CM | POA: Insufficient documentation

## 2023-06-05 DIAGNOSIS — Z87891 Personal history of nicotine dependence: Secondary | ICD-10-CM | POA: Diagnosis not present

## 2023-06-05 DIAGNOSIS — Z7982 Long term (current) use of aspirin: Secondary | ICD-10-CM

## 2023-06-05 DIAGNOSIS — Z83438 Family history of other disorder of lipoprotein metabolism and other lipidemia: Secondary | ICD-10-CM

## 2023-06-05 DIAGNOSIS — Z7985 Long-term (current) use of injectable non-insulin antidiabetic drugs: Secondary | ICD-10-CM | POA: Diagnosis not present

## 2023-06-05 DIAGNOSIS — I739 Peripheral vascular disease, unspecified: Secondary | ICD-10-CM | POA: Diagnosis present

## 2023-06-05 DIAGNOSIS — Z9861 Coronary angioplasty status: Secondary | ICD-10-CM

## 2023-06-05 DIAGNOSIS — Z79899 Other long term (current) drug therapy: Secondary | ICD-10-CM

## 2023-06-05 DIAGNOSIS — Z9842 Cataract extraction status, left eye: Secondary | ICD-10-CM | POA: Diagnosis not present

## 2023-06-05 DIAGNOSIS — Z6837 Body mass index (BMI) 37.0-37.9, adult: Secondary | ICD-10-CM

## 2023-06-05 DIAGNOSIS — R0609 Other forms of dyspnea: Secondary | ICD-10-CM

## 2023-06-05 DIAGNOSIS — R6889 Other general symptoms and signs: Secondary | ICD-10-CM | POA: Diagnosis not present

## 2023-06-05 DIAGNOSIS — R0989 Other specified symptoms and signs involving the circulatory and respiratory systems: Secondary | ICD-10-CM | POA: Diagnosis not present

## 2023-06-05 DIAGNOSIS — Z833 Family history of diabetes mellitus: Secondary | ICD-10-CM

## 2023-06-05 DIAGNOSIS — Z743 Need for continuous supervision: Secondary | ICD-10-CM | POA: Diagnosis not present

## 2023-06-05 DIAGNOSIS — R918 Other nonspecific abnormal finding of lung field: Secondary | ICD-10-CM | POA: Diagnosis not present

## 2023-06-05 LAB — CBC WITH DIFFERENTIAL/PLATELET
Abs Immature Granulocytes: 0.04 10*3/uL (ref 0.00–0.07)
Basophils Absolute: 0 10*3/uL (ref 0.0–0.1)
Basophils Relative: 0 %
Eosinophils Absolute: 0.1 10*3/uL (ref 0.0–0.5)
Eosinophils Relative: 1 %
HCT: 35.8 % — ABNORMAL LOW (ref 36.0–46.0)
Hemoglobin: 11.5 g/dL — ABNORMAL LOW (ref 12.0–15.0)
Immature Granulocytes: 1 %
Lymphocytes Relative: 24 %
Lymphs Abs: 2.1 10*3/uL (ref 0.7–4.0)
MCH: 31.1 pg (ref 26.0–34.0)
MCHC: 32.1 g/dL (ref 30.0–36.0)
MCV: 96.8 fL (ref 80.0–100.0)
Monocytes Absolute: 0.5 10*3/uL (ref 0.1–1.0)
Monocytes Relative: 6 %
Neutro Abs: 5.9 10*3/uL (ref 1.7–7.7)
Neutrophils Relative %: 68 %
Platelets: 195 10*3/uL (ref 150–400)
RBC: 3.7 MIL/uL — ABNORMAL LOW (ref 3.87–5.11)
RDW: 15.3 % (ref 11.5–15.5)
WBC: 8.7 10*3/uL (ref 4.0–10.5)
nRBC: 0 % (ref 0.0–0.2)

## 2023-06-05 LAB — BLOOD GAS, VENOUS
Acid-Base Excess: 11 mmol/L — ABNORMAL HIGH (ref 0.0–2.0)
Bicarbonate: 37.6 mmol/L — ABNORMAL HIGH (ref 20.0–28.0)
Drawn by: 7049
O2 Saturation: 39.8 %
Patient temperature: 36.8
pCO2, Ven: 57 mmHg (ref 44–60)
pH, Ven: 7.42 (ref 7.25–7.43)
pO2, Ven: <31 mmHg — CL (ref 32–45)

## 2023-06-05 LAB — BASIC METABOLIC PANEL WITH GFR
Anion gap: 5 (ref 5–15)
BUN: 17 mg/dL (ref 8–23)
CO2: 27 mmol/L (ref 22–32)
Calcium: 9 mg/dL (ref 8.9–10.3)
Chloride: 99 mmol/L (ref 98–111)
Creatinine, Ser: 0.73 mg/dL (ref 0.44–1.00)
GFR, Estimated: >60 mL/min (ref >60)
Glucose, Bld: 152 mg/dL — ABNORMAL HIGH (ref 70–99)
Potassium: 4.3 mmol/L (ref 3.5–5.1)
Sodium: 131 mmol/L — ABNORMAL LOW (ref 135–145)

## 2023-06-05 LAB — TROPONIN I (HIGH SENSITIVITY)
Troponin I (High Sensitivity): 19 ng/L — ABNORMAL HIGH (ref <18)
Troponin I (High Sensitivity): 24 ng/L — ABNORMAL HIGH (ref <18)
Troponin I (High Sensitivity): 26 ng/L — ABNORMAL HIGH (ref <18)

## 2023-06-05 LAB — GLUCOSE, CAPILLARY: Glucose-Capillary: 172 mg/dL — ABNORMAL HIGH (ref 70–99)

## 2023-06-05 LAB — BRAIN NATRIURETIC PEPTIDE: B Natriuretic Peptide: 285 pg/mL — ABNORMAL HIGH (ref 0.0–100.0)

## 2023-06-05 MED ORDER — ALBUTEROL SULFATE (2.5 MG/3ML) 0.083% IN NEBU: 2.5000 mg | INHALATION_SOLUTION | Freq: Four times a day (QID) | RESPIRATORY_TRACT | Status: DC | PRN

## 2023-06-05 MED ORDER — ROSUVASTATIN CALCIUM 20 MG PO TABS
40.0000 mg | ORAL_TABLET | Freq: Every day | ORAL | Status: DC
  Administered 2023-06-06 – 2023-06-08 (×3): 40 mg via ORAL
  Filled 2023-06-05 (×3): qty 2

## 2023-06-05 MED ORDER — LISINOPRIL 10 MG PO TABS
20.0000 mg | ORAL_TABLET | Freq: Every day | ORAL | Status: DC
  Administered 2023-06-05 – 2023-06-08 (×4): 20 mg via ORAL
  Filled 2023-06-05 (×4): qty 2

## 2023-06-05 MED ORDER — PANTOPRAZOLE SODIUM 40 MG PO TBEC
40.0000 mg | DELAYED_RELEASE_TABLET | Freq: Every day | ORAL | Status: DC
  Administered 2023-06-06 – 2023-06-08 (×3): 40 mg via ORAL
  Filled 2023-06-05 (×3): qty 1

## 2023-06-05 MED ORDER — AMLODIPINE BESYLATE 5 MG PO TABS
10.0000 mg | ORAL_TABLET | Freq: Every day | ORAL | Status: DC
  Administered 2023-06-06 – 2023-06-08 (×3): 10 mg via ORAL
  Filled 2023-06-05 (×3): qty 2

## 2023-06-05 MED ORDER — IPRATROPIUM-ALBUTEROL 0.5-2.5 (3) MG/3ML IN SOLN
3.0000 mL | Freq: Once | RESPIRATORY_TRACT | Status: AC
  Administered 2023-06-05: 3 mL via RESPIRATORY_TRACT
  Filled 2023-06-05: qty 3

## 2023-06-05 MED ORDER — ONDANSETRON HCL 4 MG/2ML IJ SOLN: 4.0000 mg | Freq: Four times a day (QID) | INTRAMUSCULAR | Status: DC | PRN

## 2023-06-05 MED ORDER — SPIRONOLACTONE 25 MG PO TABS
25.0000 mg | ORAL_TABLET | Freq: Every day | ORAL | Status: DC
  Administered 2023-06-05 – 2023-06-08 (×4): 25 mg via ORAL
  Filled 2023-06-05 (×4): qty 1

## 2023-06-05 MED ORDER — ACETAMINOPHEN 650 MG RE SUPP: 650.0000 mg | Freq: Four times a day (QID) | RECTAL | Status: DC | PRN

## 2023-06-05 MED ORDER — FUROSEMIDE 10 MG/ML IJ SOLN
40.0000 mg | Freq: Once | INTRAMUSCULAR | Status: AC
  Administered 2023-06-05: 40 mg via INTRAVENOUS
  Filled 2023-06-05: qty 4

## 2023-06-05 MED ORDER — INSULIN ASPART 100 UNIT/ML IJ SOLN
0.0000 [IU] | Freq: Every day | INTRAMUSCULAR | Status: DC
  Administered 2023-06-06: 2 [IU] via SUBCUTANEOUS

## 2023-06-05 MED ORDER — CLOPIDOGREL BISULFATE 75 MG PO TABS
75.0000 mg | ORAL_TABLET | Freq: Every day | ORAL | Status: DC
  Administered 2023-06-06 – 2023-06-08 (×3): 75 mg via ORAL
  Filled 2023-06-05 (×3): qty 1

## 2023-06-05 MED ORDER — ENOXAPARIN SODIUM 60 MG/0.6ML IJ SOSY
50.0000 mg | PREFILLED_SYRINGE | INTRAMUSCULAR | Status: DC
  Administered 2023-06-05 – 2023-06-07 (×3): 50 mg via SUBCUTANEOUS
  Filled 2023-06-05 (×3): qty 0.6

## 2023-06-05 MED ORDER — ACETAMINOPHEN 325 MG PO TABS: 650.0000 mg | ORAL_TABLET | Freq: Four times a day (QID) | ORAL | Status: DC | PRN

## 2023-06-05 MED ORDER — FUROSEMIDE 10 MG/ML IJ SOLN
40.0000 mg | Freq: Two times a day (BID) | INTRAMUSCULAR | Status: DC
  Administered 2023-06-06 – 2023-06-07 (×3): 40 mg via INTRAVENOUS
  Filled 2023-06-05 (×3): qty 4

## 2023-06-05 MED ORDER — ONDANSETRON HCL 4 MG PO TABS: 4.0000 mg | ORAL_TABLET | Freq: Four times a day (QID) | ORAL | Status: DC | PRN

## 2023-06-05 MED ORDER — INSULIN ASPART 100 UNIT/ML IJ SOLN
0.0000 [IU] | Freq: Three times a day (TID) | INTRAMUSCULAR | Status: DC
  Administered 2023-06-06 (×2): 8 [IU] via SUBCUTANEOUS
  Administered 2023-06-06: 3 [IU] via SUBCUTANEOUS
  Administered 2023-06-07: 5 [IU] via SUBCUTANEOUS
  Administered 2023-06-07: 3 [IU] via SUBCUTANEOUS
  Administered 2023-06-08: 5 [IU] via SUBCUTANEOUS

## 2023-06-05 MED ORDER — INSULIN GLARGINE-YFGN 100 UNIT/ML ~~LOC~~ SOLN
8.0000 [IU] | Freq: Every day | SUBCUTANEOUS | Status: DC
  Administered 2023-06-05 – 2023-06-06 (×2): 8 [IU] via SUBCUTANEOUS
  Filled 2023-06-05 (×3): qty 0.08

## 2023-06-05 NOTE — Telephone Encounter (Signed)
 Called pt back regarding her shortness of breath and weakness. Pt states that she feels like she is getting weaker and weaker in her legs. She states that she is supposed to go to her supervised exercise therapy today but she doesn't know if she is going to make it. She states that she knows that something more is wrong with her. Pt states she is achy and has been taking tylenol  for this, she also mentions that when she lays down she feels like she is drowning in phlegm. Pt states that she can only walk for short distances before she has to stop and catch her breath. Pt does mention swelling in her legs that gets better with elevation. Pt did take her blood pressure earlier, 147/49, 52bpm. Pt is taking all her medications at prescribed including her spironolactone  25mg  daily. She is also being treated for seasonal allergies. Pt was recently at her PCP's office, she states that she didn't feel like this 3 days ago. Advised pt to call PCP back, she might have something viral going on. Explained that this message will be sent over to Dr. Alvenia Aus to advise. ED precautions discussed with pt. Pt verbalizes understanding.

## 2023-06-05 NOTE — ED Notes (Signed)
 Pt ambulated with pulse ox to and from bathroom, pt was at 93% on room air before ambulating, pt dropped down to 83 % on room air and maintained at 83 while in bathroom, ambulated back to room and was at 80% on room air, placed on 3 L [place] because sat was not stabilizing, pt now at 91% on 3L

## 2023-06-05 NOTE — Progress Notes (Signed)
 PHARMACIST - PHYSICIAN COMMUNICATION  CONCERNING:  Enoxaparin  (Lovenox ) for DVT Prophylaxis   RECOMMENDATION: Patient was prescribed enoxaprin 40mg  q24 hours for VTE prophylaxis.   Filed Weights   06/05/23 1336  Weight: 92.9 kg (204 lb 12.9 oz)   Body mass index is 37.46 kg/m.  Based on [facility] policy patient is candidate for enoxaparin  0.5mg /kg TBW SQ every 24 hours based on BMI being >30.  DESCRIPTION: Pharmacy has adjusted enoxaparin  dose per [facility] policy.  Patient is now receiving enoxaparin  50 mg every 24 hours   Marjean Imperato, PharmD Pharmacy Resident  06/05/2023 8:55 PM

## 2023-06-05 NOTE — H&P (Incomplete)
 History and Physical    Patient: Leah Ferrell ZOX:[id number] DOB: 07-Sep-1950 DOA: 06/05/2023 DOS: the patient was seen and examined on 06/05/2023 PCP: Yevette Hem, FNP  Patient coming from: Home  Chief Complaint:  Chief Complaint  Patient presents with   exertional dyspnea   HPI: Leah Ferrell is a 73 y.o. female with medical history significant of CAD s/p DES, chronic diastolic CHF, hypertension, hyperlipidemia, GERD, T2DM who presents to the emergency department due to several weeks of progressively worsening shortness of breath at night and on exertion which rapidly worsened within the last 24 hours she complained of postnasal drip and sinus pressure congestion with an associated nonproductive cough.  She complained of being compliant with home medication and she denies chest pain, fever, chills, increased leg swelling, nausea or vomiting.  ED Course:  In the emergency department, BP was 146/48, pulse 44 bpm, other vital signs are within normal range.  Patient's O2 sats dropped to 80% on room air on ambulation, this improved to 91% on supplemental oxygen via [place] at 3 LPM.  Workup in the ED showed normocytic anemia and normal BMP except for sodium of 131 and blood glucose of 152.  BNP 285, troponin x 1 was 19. Chest x-ray showed cardiomegaly  with vascular congestion and diffuse interstitial opacity favored to be secondary to edema. Possible tiny pleural effusions. IV Lasix  40 mg x 1 was given.  DuoNeb was given. Hospitalist was asked to admit patient for further evaluation and management.  Review of Systems: Review of systems as noted in the HPI. All other systems reviewed and are negative.   Past Medical History:  Diagnosis Date   CAD (coronary artery disease)    a. 04/2012 NSTEMI: s/p DES to LAD.   Chronic diastolic CHF (congestive heart failure) (HCC)    Diabetes mellitus without complication (HCC)    Dysrhythmia    Environmental and seasonal allergies     Hyperlipidemia    Hypertension    Leukocytosis    Morbid obesity (HCC)    NSTEMI (non-ST elevated myocardial infarction) (HCC) 04/27/2012   PONV (postoperative nausea and vomiting)    PVD (peripheral vascular disease) (HCC)    a. 04/2012 ABI: R 0.49, L 0.39. Angiography 06/14: Significant ostial left common iliac artery stenosis extending into the distal aorta, severe left common femoral artery stenosis, bilateral SFA occlusion with heavy calcifications. Functionally, one-vessel runoff bilaterally below the knee   Renal artery stenosis (HCC)    s/p angioplasty/stenting bilateral renal arteries 03/16/19   Shortness of breath    Past Surgical History:  Procedure Laterality Date   ABDOMINAL AORTAGRAM N/A 07/21/2012   Procedure: ABDOMINAL Tommi Fraise;  Surgeon: Wenona Hamilton, MD;  Location: [facility] CATH LAB;  Service: Cardiovascular;  Laterality: N/A;   ABDOMINAL AORTOGRAM W/LOWER EXTREMITY N/A 05/09/2020   Procedure: ABDOMINAL AORTOGRAM W/LOWER EXTREMITY;  Surgeon: Wenona Hamilton, MD;  Location: [facility] INVASIVE CV LAB;  Service: Cardiovascular;  Laterality: N/A;   CARDIAC CATHETERIZATION     stents X 2   CATARACT EXTRACTION W/PHACO Left 09/08/2022   Procedure: CATARACT EXTRACTION PHACO AND INTRAOCULAR LENS PLACEMENT (IOC) with placement of Corticosteroid;  Surgeon: Tarri Farm, MD;  Location: [facility] ORS;  Service: Ophthalmology;  Laterality: Left;  CDE 8.48   CORONARY ANGIOGRAM  04/27/2012   Procedure: CORONARY ANGIOGRAM;  Surgeon: Lake Pilgrim, MD;  Location: [facility] CATH LAB;  Service: Cardiovascular;;   CORONARY ARTERY BYPASS GRAFT N/A 06/25/2020   Procedure: CORONARY ARTERY BYPASS GRAFTING (CABG)X3. LEFT  INTERNAL MAMMARY ARTERY. RIGHT ENDOSCOPIC SAPHENOUS VEIN HARVESTING.;  Surgeon: Rudine Cos, MD;  Location: [facility] OR;  Service: Open Heart Surgery;  Laterality: N/A;   CORONARY STENT INTERVENTION N/A 04/08/2023   Procedure: CORONARY STENT INTERVENTION;  Surgeon: Wenona Hamilton, MD;  Location: [facility]  INVASIVE CV LAB;  Service: Cardiovascular;  Laterality: N/A;  RCA   ENDARTERECTOMY FEMORAL Left 11/02/2013   Procedure: RESECTION LEFT COMMON FEMORAL ARTERY AND INSERTION OF INTERPOSITION 7mm HEMASHIELD GRAFT FROM LEFT EXTERNAL  ILIAC ARTERY TO LEFT PROFUNDA  FEMORIS ARTERY;  Surgeon: Palma Bob, MD;  Location: [facility] OR;  Service: Vascular;  Laterality: Left;   EYE SURGERY Right 2017   FRACTURE SURGERY Right Jul 07, 2014   Right Foot-  Pt.fell  at Home  by Heat duct   ILIAC ARTERY STENT Left 08/31/2013   LAD stent     LEFT HEART CATH AND CORONARY ANGIOGRAPHY N/A 05/09/2020   Procedure: LEFT HEART CATH AND CORONARY ANGIOGRAPHY;  Surgeon: Wenona Hamilton, MD;  [facility] LAB;  Service: Cardiovascular;  Laterality: N/A;   LEFT HEART CATH AND CORS/GRAFTS ANGIOGRAPHY N/A 04/08/2023   Procedure: LEFT HEART CATH AND CORS/GRAFTS ANGIOGRAPHY;  Surgeon: Wenona Hamilton, MD;  [facility] CV LAB;  Service: Cardiovascular;  Laterality: N/A;   LEFT HEART CATHETERIZATION WITH CORONARY ANGIOGRAM N/A 04/23/2012   Procedure: LEFT HEART CATHETERIZATION WITH CORONARY ANGIOGRAM;  Surgeon: Peter M Swaziland, MD;  Location: [facility] CATH LAB;  Service: Cardiovascular;  Laterality: N/A;   LOWER EXTREMITY ANGIOGRAM Left 08/31/2013   Procedure: LOWER EXTREMITY ANGIOGRAM;  Surgeon: Wenona Hamilton, MD;  [facility] CATH [facility];  Service: Cardiovascular;  Laterality: Left;   PERCUTANEOUS STENT INTERVENTION Left 08/31/2013   Procedure: PERCUTANEOUS STENT INTERVENTION;  Surgeon: Wenona Hamilton, MD;  [facility] CATH [facility];  Service: Cardiovascular;  Laterality: Left;  Common Iliac artery   PERIPHERAL VASCULAR CATHETERIZATION N/A 03/19/2016   Procedure: Abdominal Aortogram w/Lower Extremity;  Surgeon: Wenona Hamilton, MD;  [facility];  Service: Cardiovascular;  Laterality: N/A;   PERIPHERAL VASCULAR CATHETERIZATION  03/19/2016   Procedure: Peripheral Vascular Balloon Angioplasty-CFA Left;   Surgeon: Wenona Hamilton, MD;  [facility] LAB;  Service: Cardiovascular;;   PERIPHERAL VASCULAR INTERVENTION Bilateral 03/16/2019   Procedure: PERIPHERAL VASCULAR INTERVENTION;  Surgeon: Wenona Hamilton, MD;  [facility];  Service: Cardiovascular;  Laterality: Bilateral;  RENAL   PERIPHERAL VASCULAR INTERVENTION Bilateral 05/09/2020   Procedure: PERIPHERAL VASCULAR INTERVENTION;  Surgeon: Wenona Hamilton, MD;  [facility];  Service: Cardiovascular;  Laterality: Bilateral;  Iliac   RENAL ANGIOGRAPHY  03/16/2019   RENAL ANGIOGRAPHY Bilateral 03/16/2019   Procedure: RENAL ANGIOGRAPHY;  Surgeon: Wenona Hamilton, MD;  [facility];  Service: Cardiovascular;  Laterality: Bilateral;   RENAL ANGIOGRAPHY Bilateral 04/08/2023   Procedure: RENAL ANGIOGRAPHY;  Surgeon: Wenona Hamilton, MD;  [facility];  Service: Cardiovascular;  Laterality: Bilateral;   TEE WITHOUT CARDIOVERSION N/A 06/25/2020   Procedure: TRANSESOPHAGEAL ECHOCARDIOGRAM (TEE);  Surgeon: Rudine Cos, MD;  Location: [facility] OR;  Service: Open Heart Surgery;  Laterality: N/A;   TUMOR REMOVAL     tumor removed from [place]    Social History:  reports that she quit smoking about 22 years ago. Her smoking use included cigarettes. She started smoking about 59 years ago. She has a 36.8 pack-year smoking history. She has never used smokeless tobacco. She  reports that she does not drink alcohol  and does not use drugs.   Allergies  Allergen Reactions   Tape Rash    Family History  Problem Relation Age of Onset   Diabetes Mother    Hyperlipidemia Mother    Hypertension Mother    Peripheral vascular disease Mother    Other Sister        drowned   Other Sister        drowned   Diabetes Daughter        borderline   Hypertension Daughter    Diabetes Maternal Grandmother    Heart attack Maternal Grandfather    Heart disease Brother    Diabetes Brother     Alcohol  abuse Brother    Diabetes Son    Hypertension Son    Hyperlipidemia Son    Breast cancer Neg Hx      Prior to Admission medications   Medication Sig Start Date End Date Taking? Authorizing Provider  acetaminophen  (TYLENOL ) 325 MG tablet Take 2 tablets (650 mg total) by mouth every 6 (six) hours as needed for mild pain (or Fever >/= 101). 12/13/18   Colin Dawley, MD  albuterol  (VENTOLIN  HFA) 108 (90 Base) MCG/ACT inhaler INHALE 1 TO 2 PUFFS BY MOUTH EVERY 6 HOURS AS NEEDED FOR WHEEZING AND FOR SHORTNESS OF BREATH 03/10/23   Tommas Fragmin A, FNP  amLODipine  (NORVASC ) 10 MG tablet Take 1 tablet (10 mg total) by mouth daily. 04/11/23 07/10/23  Hoyt Macleod, MD  Ascorbic Acid  (VITAMIN C) 1000 MG tablet Take 1,000 mg by mouth 3 (three) times daily.     [provider]  aspirin  EC 81 MG tablet Take 81 mg by mouth daily. In the morning    [provider]  BD ULTRA-FINE PEN NEEDLES 29G X 12.7MM MISC USE PENNEEDLES 3 TIMES  DAILY AS DIRECTED 06/17/18   Yevette Hem, FNP  blood glucose meter kit and supplies Dispense based on patient and insurance preference. Use up to four times daily as directed. (FOR ICD-10 E10.9, E11.9). 12/19/19   Hawks, Libbie Redwood, FNP  Calcium  Carbonate-Vit D-Min (CALTRATE 600+D PLUS MINERALS) 600-800 MG-UNIT TABS Take 1 tablet by mouth 3 (three) times daily.     [provider]  carvedilol  (COREG ) 25 MG tablet Take 1 tablet (25 mg total) by mouth 2 (two) times daily with a meal. 06/02/23 08/31/23  Hawks, Kathaleen Pale A, FNP  Chlorpheniramine-Acetaminophen  (CORICIDIN HBP COLD/FLU PO) Take 1 tablet by mouth daily as needed (cough/cold).    [provider]  Cholecalciferol  (VITAMIN D -3) 125 MCG (5000 UT) TABS Take 5,000 Units by mouth daily.    [provider]  clopidogrel  (PLAVIX ) 75 MG tablet Take 1 tablet by mouth once daily 05/12/23   Tommas Fragmin A, FNP  fish oil-omega-3 fatty acids  1000 MG capsule Take 1 g by mouth daily.      [provider]  glucose blood (GLUCOSE METER TEST) test strip Use 6-10 times a day, Uses [employer] 12/19/19   Tommas Fragmin A, FNP  hydroxypropyl methylcellulose (ISOPTO TEARS) 2.5 % ophthalmic solution Place 1 drop into both eyes 3 (three) times daily as needed (for dry eyes).    [provider]  insulin  degludec (TRESIBA  FLEXTOUCH) 100 UNIT/ML FlexTouch Pen Inject 15-30 Units into the skin daily. Patient taking differently: Inject 20 Units into the skin daily. 09/09/22   Yevette Hem, FNP  insulin  lispro (HUMALOG  KWIKPEN) 200 UNIT/ML KwikPen Inject 50 Units into the skin 3 (three) times  daily before meals. 02/06/23   Yevette Hem, FNP  Insulin  Syringe 27G X 1/2" 0.5 ML MISC Use with Fiasp  insulin  TID 04/02/20   Yevette Hem, FNP  lisinopril  (ZESTRIL ) 20 MG tablet Take 1 tablet by mouth twice daily 04/02/23   Tommas Fragmin A, FNP  montelukast  (SINGULAIR ) 10 MG tablet Take 1 tablet by mouth once daily with breakfast 05/05/23   Tommas Fragmin A, FNP  nitroGLYCERIN  (NITROSTAT ) 0.4 MG SL tablet Place 1 tablet (0.4 mg total) under the tongue every 5 (five) minutes as needed for chest pain. 04/10/23   Hoyt Macleod, MD  oxybutynin  (DITROPAN -XL) 5 MG 24 hr tablet Take 1 tablet (5 mg total) by mouth at bedtime. 05/21/23   Yevette Hem, FNP  pantoprazole  (PROTONIX ) 40 MG tablet Take 1 tablet (40 mg total) by mouth daily. 05/08/23 06/07/23  Tommas Fragmin A, FNP  RELION PEN NEEDLES 29G X MISC USE 1 NEEDLE TO INJECT INSULIN  SUBCUTANEOUSLY 3 TIMES DAILY Patient taking differently: 1 Device by Subconjunctival route 3 (three) times daily. 01/31/16   Yevette Hem, FNP  rosuvastatin  (CRESTOR ) 40 MG tablet Take 1 tablet by mouth once daily 03/16/23   Tommas Fragmin A, FNP  Semaglutide , 2 MG/DOSE, (OZEMPIC , 2 MG/DOSE,) 8 MG/3ML SOPN Inject 2 mg into the skin once a week. 09/09/22   Yevette Hem, FNP  spironolactone  (ALDACTONE ) 25 MG tablet Take 1 tablet (25 mg total) by mouth daily.  06/02/23 08/31/23  Yevette Hem, FNP    Physical Exam: BP (!) 168/51   Pulse (!) 56   Temp 98.4 F (36.9 C) (Oral)   Resp 20   Ht 5\' 2"  (1.575 m)   Wt 92.9 kg   SpO2 100%   BMI 37.46 kg/m   General: 73 y.o. year-old female well developed well nourished in no acute distress.  Alert and oriented x3. HEENT: NCAT, EOMI Neck: Supple, trachea medial Cardiovascular: Regular rate and rhythm with no rubs or gallops.  No thyromegaly or JVD noted.  No lower extremity edema. 2/4 pulses in all 4 extremities. Respiratory: Clear to auscultation with no wheezes or rales. Good inspiratory effort. Abdomen: Soft, nontender nondistended with normal bowel sounds x4 quadrants. Muskuloskeletal: No cyanosis, clubbing or edema noted bilaterally Neuro: CN II-XII intact, strength 5/5 x 4, sensation, reflexes intact Skin: No ulcerative lesions noted or rashes Psychiatry: Judgement and insight appear normal. Mood is appropriate for condition and setting          Labs on Admission:  Basic Metabolic Panel: Recent Labs  Lab 06/05/23 1423  NA 131*  K 4.3  CL 99  CO2 27  GLUCOSE 152*  BUN 17  CREATININE 0.73  CALCIUM  9.0   Liver Function Tests: No results for input(s): "AST", "ALT", "ALKPHOS", "BILITOT", "PROT", "ALBUMIN " in the last 168 hours. No results for input(s): "LIPASE", "AMYLASE" in the last 168 hours. No results for input(s): "AMMONIA" in the last 168 hours. CBC: Recent Labs  Lab 06/05/23 1423  WBC 8.7  NEUTROABS 5.9  HGB 11.5*  HCT 35.8*  MCV 96.8  PLT 195   Cardiac Enzymes: No results for input(s): "CKTOTAL", "CKMB", "CKMBINDEX", "TROPONINI" in the last 168 hours.  BNP (last 3 results) Recent Labs    06/05/23 1423  BNP 285.0*    ProBNP (last 3 results) No results for input(s): "PROBNP" in the last 8760 hours.  CBG: Recent Labs  Lab 06/03/23 1408 06/03/23 1529 06/03/23 1541 06/05/23 2102  GLUCAP 120* 71 82 172*  Radiological Exams on Admission: DG Chest 2  View Result Date: 06/05/2023 CLINICAL DATA:  Dyspnea EXAM: CHEST - 2 VIEW COMPARISON:  04/06/2023 FINDINGS: Post sternotomy changes. Cardiomegaly with vascular congestion and diffuse interstitial opacity favored to be secondary to edema. Possible tiny pleural effusions. Aortic atherosclerosis. No pneumothorax IMPRESSION: Cardiomegaly with vascular congestion and diffuse interstitial opacity favored to be secondary to edema. Possible tiny pleural effusions. Electronically Signed   By: Esmeralda Hedge M.D.   On: 06/05/2023 16:42    EKG: I independently viewed the EKG done and my findings are as followed: Sinus bradycardia with rate of 48 bpm  Assessment/Plan Present on Admission:  Acute on chronic diastolic CHF (congestive heart failure) (HCC)  Essential hypertension  Elevated troponin  CAD (coronary artery disease)  PAD (peripheral artery disease) (HCC)  Mixed hyperlipidemia  Principal Problem:   Acute on chronic diastolic CHF (congestive heart failure) (HCC) Active Problems:   Elevated troponin   Essential hypertension   Mixed hyperlipidemia   CAD (coronary artery disease)   PAD (peripheral artery disease) (HCC)   Acute respiratory failure with hypoxia (HCC)   Type 2 diabetes mellitus with hyperglycemia (HCC)   GERD (gastroesophageal reflux disease)  Acute on chronic diastolic CHF Elevated BNP BNP 782 Continue total input/output, daily weights and fluid restriction IV Lasix  40 mg x 1 was given.  Continue IV Lasix  40 mg twice daily Continue heart healthy/carb modified diet  Echocardiogram done on 04/08/2023 showed LVEF of 53%.  No RWMA.  G1 DD.  Echocardiogram will be done in the morning   Acute respiratory with hypoxia Continue supplemental oxygen via [place] to maintain O2 sat > 92% with plan to wean patient off this as tolerated.  Patient does not use oxygen at baseline.  Elevated troponin possibly secondary to type II demand ischemia Troponin 19 > 24, patient denies chest pain.   Continue to trend troponin  Type 2 diabetes mellitus with hyperglycemia Continue Semglee  nightly and adjust dose accordingly Continue ISS and hypoglycemia protocol  CAD s/p DES/ PAD Continue aspirin , Plavix , Crestor  Coreg  will be held at this time due to bradycardia  Essential hypertension Continue amlodipine , lisinopril , Aldactone  Coreg  will be temporarily held due to bradycardia  Mixed hyperlipidemia Continue Crestor   GERD Continue Protonix   DVT prophylaxis: Lovenox   Code Status: Full code  Family Communication: None at bedside  Consults: None  Severity of Illness: The appropriate patient status for this patient is INPATIENT. Inpatient status is judged to be reasonable and necessary in order to provide the required intensity of service to ensure the patient's safety. The patient's presenting symptoms, physical exam findings, and initial radiographic and laboratory data in the context of their chronic comorbidities is felt to place them at high risk for further clinical deterioration. Furthermore, it is not anticipated that the patient will be medically stable for discharge from the hospital within 2 midnights of admission.   * I certify that at the point of admission it is my clinical judgment that the patient will require inpatient hospital care spanning beyond 2 midnights from the point of admission due to high intensity of service, high risk for further deterioration and high frequency of surveillance required.*  Author: Hermelinda Diegel, DO 06/05/2023 9:09 PM  For on call review [email or web address].

## 2023-06-05 NOTE — ED Notes (Signed)
RT at bedside for respiratory treatment.

## 2023-06-05 NOTE — Progress Notes (Signed)
   06/05/23 2224  TOC Brief Assessment  Insurance and Status Reviewed  Patient has primary care physician Yes  Home environment has been reviewed From home alone  Prior level of function: Independent  Prior/Current Home Services No current home services  Social Drivers of Health Review SDOH reviewed no interventions necessary  Readmission risk has been reviewed Yes  Transition of care needs no transition of care needs at this time   Pt is active c/outpatient cardiac rehab at [facility].   Transition of Care Department [facility]) has reviewed patient and no other TOC needs have been identified at this time. We will continue to monitor patient advancement through interdisciplinary progression rounds. If new patient needs arise, please place a TOC consult.

## 2023-06-05 NOTE — Telephone Encounter (Signed)
 Pt c/o Shortness Of Breath: STAT if SOB developed within the last 24 hours or pt is noticeably SOB on the phone  1. Are you currently SOB (can you hear that pt is SOB on the phone)? no  2. How long have you been experiencing SOB? 2 weeks  3. Are you SOB when sitting or when up moving around? Moving around  4. Are you currently experiencing any other symptoms? Cannot lay flat to sleep,leg swelling,chest tightness

## 2023-06-05 NOTE — ED Provider Notes (Signed)
 [place] EMERGENCY DEPARTMENT AT [facility] Provider Note  CSN: [id number] Arrival date & time: 06/05/23 1324  Chief Complaint(s) exertional dyspnea  HPI Leah Ferrell is a 73 y.o. female with past medical history as below, significant for CAD, diastolic CHF, DM, obesity, PVD, CABG x 3 who presents to the ED with complaint of dyspnea  She reports progressively worsening exertional dyspnea and nocturnal dyspnea over the past few weeks.  She feels is worse in the past 24 hours.  She has nonproductive cough, worse at nighttime.  Significant postnasal drip and sinus pressure congestion.  Family reports she snores very loudly at nighttime but has not been diagnosed with sleep apnea.  Has been compliant with home medications.  No chest pain, fevers, chills, nausea or vomiting.  No unusual leg swelling or unexpected weight changes.  No longer smokes cigarettes.  Past Medical History Past Medical History:  Diagnosis Date   CAD (coronary artery disease)    a. 04/2012 NSTEMI: s/p DES to LAD.   Chronic diastolic CHF (congestive heart failure) (HCC)    Diabetes mellitus without complication (HCC)    Dysrhythmia    Environmental and seasonal allergies    Hyperlipidemia    Hypertension    Leukocytosis    Morbid obesity (HCC)    NSTEMI (non-ST elevated myocardial infarction) (HCC) 04/27/2012   PONV (postoperative nausea and vomiting)    PVD (peripheral vascular disease) (HCC)    a. 04/2012 ABI: R 0.49, L 0.39. Angiography 06/14: Significant ostial left common iliac artery stenosis extending into the distal aorta, severe left common femoral artery stenosis, bilateral SFA occlusion with heavy calcifications. 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   Hypertensive urgency 12/13/2018   Osteopenia 04/27/2018   Acute on chronic diastolic CHF (congestive heart failure) (HCC) 12/02/2017   Dizziness 12/01/2017   Morbid obesity (HCC) 06/10/2017   PMB (postmenopausal bleeding) 11/12/2015   Hypokalemia 11/23/2014   Leg pain, left 07/06/2014   Vaginal atrophy 05/08/2014   Left leg swelling 01/17/2014   PAD (peripheral artery disease) (HCC) 10/18/2013   Abnormal stress test 07/18/2013   PVD (peripheral vascular disease) (HCC) 07/12/2013   Status post coronary artery stent placement 07/12/2013   Chronic diastolic heart failure (HCC) 08/27/2012   Carotid bruit 08/27/2012   Angina pectoris (HCC) 04/30/2012   CAD (coronary artery disease) 04/28/2012   Hx of non-ST elevation myocardial infarction (NSTEMI) 04/23/2012   Hypertension associated with diabetes (HCC)    Diabetes mellitus with peripheral vascular disease (HCC)    Home Medication(s) Prior to Admission medications   Medication Sig Start Date End Date Taking? Authorizing Provider  acetaminophen  (TYLENOL ) 325 MG tablet Take 2 tablets (650 mg total) by mouth every 6 (six) hours as needed for mild pain (or Fever >/= 101). 12/13/18   Colin Dawley, MD  albuterol  (VENTOLIN  HFA) 108 (90 Base) MCG/ACT inhaler INHALE 1 TO 2 PUFFS BY MOUTH EVERY 6 HOURS AS NEEDED FOR WHEEZING AND FOR SHORTNESS OF BREATH 03/10/23   Hawks, Kathaleen Pale A, FNP  amLODipine  (NORVASC ) 10 MG tablet Take 1 tablet (10 mg total) by mouth daily. 04/11/23 07/10/23  Hoyt Macleod, MD  Ascorbic Acid  (VITAMIN C) 1000 MG tablet Take 1,000 mg by mouth 3 (three) times daily.     [provider]  aspirin  EC 81 MG tablet Take 81 mg by mouth daily. In the morning    [provider]  BD ULTRA-FINE PEN NEEDLES 29G X 12.7MM MISC USE PENNEEDLES 3 TIMES  DAILY AS DIRECTED 06/17/18   Yevette Hem, FNP  blood glucose meter kit and supplies Dispense based on patient and insurance preference. Use up to four times daily as directed. (FOR ICD-10 E10.9, E11.9). 12/19/19   Yevette Hem, FNP  Calcium  Carbonate-Vit D-Min (CALTRATE 600+D PLUS MINERALS) 600-800 MG-UNIT TABS Take 1 tablet by mouth 3 (three) times daily.     [provider]  carvedilol  (COREG ) 25 MG tablet Take 1 tablet (25 mg total) by mouth 2 (two) times daily with a meal. 06/02/23 08/31/23  Hawks, Kathaleen Pale A, FNP  Chlorpheniramine-Acetaminophen  (CORICIDIN HBP COLD/FLU PO) Take 1 tablet by mouth daily as needed (cough/cold).    [provider]  Cholecalciferol  (VITAMIN D -3) 125 MCG (5000 UT) TABS Take 5,000 Units by mouth daily.    [provider]  clopidogrel  (PLAVIX ) 75 MG tablet Take 1 tablet by mouth once daily 05/12/23   Tommas Fragmin A, FNP  fish oil-omega-3 fatty acids  1000 MG capsule Take 1 g by mouth daily.     [provider]  glucose blood (GLUCOSE METER TEST) test strip Use 6-10 times a day, Uses [employer] 12/19/19   Tommas Fragmin A, FNP  hydroxypropyl methylcellulose (ISOPTO TEARS) 2.5 % ophthalmic solution Place 1 drop into both eyes 3 (three) times daily as needed (for dry eyes).    [provider]  insulin  degludec (TRESIBA  FLEXTOUCH) 100 UNIT/ML FlexTouch Pen Inject 15-30 Units into the skin daily. Patient taking differently: Inject 20 Units into the skin daily. 09/09/22   Yevette Hem, FNP  insulin  lispro (HUMALOG  KWIKPEN) 200 UNIT/ML KwikPen Inject 50 Units into the skin 3 (three) times daily before meals. 02/06/23   Yevette Hem, FNP  Insulin  Syringe 27G X 1/2" 0.5 ML MISC Use with Fiasp  insulin  TID 04/02/20   Yevette Hem, FNP  lisinopril  (ZESTRIL ) 20 MG tablet Take 1 tablet by mouth twice daily 04/02/23   Yevette Hem, FNP  montelukast  (SINGULAIR ) 10 MG tablet Take 1 tablet by mouth once daily with breakfast 05/05/23   Tommas Fragmin A, FNP  nitroGLYCERIN   (NITROSTAT ) 0.4 MG SL tablet Place 1 tablet (0.4 mg total) under the tongue every 5 (five) minutes as needed for chest pain. 04/10/23   Hoyt Macleod, MD  oxybutynin  (DITROPAN -XL) 5 MG 24 hr tablet Take 1 tablet (5 mg total) by mouth at bedtime. 05/21/23   Tommas Fragmin A, FNP  pantoprazole  (PROTONIX ) 40 MG tablet Take 1 tablet (40 mg total) by mouth daily. 05/08/23 06/07/23  Tommas Fragmin A, FNP  RELION PEN NEEDLES 29G X MISC USE 1 NEEDLE TO INJECT INSULIN  SUBCUTANEOUSLY 3 TIMES DAILY Patient taking differently: 1 Device by Subconjunctival route 3 (three) times daily. 01/31/16   Yevette Hem, FNP  rosuvastatin  (CRESTOR ) 40 MG tablet Take 1 tablet by mouth once daily 03/16/23   Tommas Fragmin A, FNP  Semaglutide , 2 MG/DOSE, (OZEMPIC , 2 MG/DOSE,) 8 MG/3ML SOPN Inject 2 mg into the skin once a week. 09/09/22   Yevette Hem, FNP  spironolactone  (ALDACTONE ) 25 MG tablet Take 1 tablet (25 mg total) by mouth daily. 06/02/23 08/31/23  Marylin So,  Libbie Redwood, FNP                                                                                                                                    Past Surgical History Past Surgical History:  Procedure Laterality Date   ABDOMINAL AORTAGRAM N/A 07/21/2012   Procedure: ABDOMINAL AORTAGRAM;  Surgeon: Wenona Hamilton, MD;  Location: [facility] CATH LAB;  Service: Cardiovascular;  Laterality: N/A;   ABDOMINAL AORTOGRAM W/LOWER EXTREMITY N/A 05/09/2020   Procedure: ABDOMINAL AORTOGRAM W/LOWER EXTREMITY;  Surgeon: Wenona Hamilton, MD;  Location: [facility] INVASIVE CV LAB;  Service: Cardiovascular;  Laterality: N/A;   CARDIAC CATHETERIZATION     stents X 2   CATARACT EXTRACTION W/PHACO Left 09/08/2022   Procedure: CATARACT EXTRACTION PHACO AND INTRAOCULAR LENS PLACEMENT (IOC) with placement of Corticosteroid;  Surgeon: Tarri Farm, MD;  Location: [facility] ORS;  Service: Ophthalmology;  Laterality: Left;  CDE 8.48   CORONARY ANGIOGRAM  04/27/2012   Procedure: CORONARY ANGIOGRAM;   Surgeon: Lake Pilgrim, MD;  Location: [facility] CATH LAB;  Service: Cardiovascular;;   CORONARY ARTERY BYPASS GRAFT N/A 06/25/2020   Procedure: CORONARY ARTERY BYPASS GRAFTING (CABG)X3. LEFT INTERNAL MAMMARY ARTERY. RIGHT ENDOSCOPIC SAPHENOUS VEIN HARVESTING.;  Surgeon: Rudine Cos, MD;  Location: [facility] OR;  Service: Open Heart Surgery;  Laterality: N/A;   CORONARY STENT INTERVENTION N/A 04/08/2023   Procedure: CORONARY STENT INTERVENTION;  Surgeon: Wenona Hamilton, MD;  Location: [facility] INVASIVE CV LAB;  Service: Cardiovascular;  Laterality: N/A;  RCA   ENDARTERECTOMY FEMORAL Left 11/02/2013   Procedure: RESECTION LEFT COMMON FEMORAL ARTERY AND INSERTION OF INTERPOSITION 7mm HEMASHIELD GRAFT FROM LEFT EXTERNAL  ILIAC ARTERY TO LEFT PROFUNDA  FEMORIS ARTERY;  Surgeon: Palma Bob, MD;  Location: [facility] OR;  Service: Vascular;  Laterality: Left;   EYE SURGERY Right 2017   FRACTURE SURGERY Right Jul 07, 2014   Right Foot-  [place]  at Home  by Heat duct   ILIAC ARTERY STENT Left 08/31/2013   LAD stent     LEFT HEART CATH AND CORONARY ANGIOGRAPHY N/A 05/09/2020   Procedure: LEFT HEART CATH AND CORONARY ANGIOGRAPHY;  Surgeon: Wenona Hamilton, MD;  Location: [facility] INV[facility] CV LAB;  Service: Cardiovascular;  Laterality: N/A;   LEFT HEART CATH AND CORS/GRAFTS ANGIOGRAPHY N/A 04/08/2023   Procedure: LEFT HEART CATH AND CORS/GRAFTS ANGIOGRAPHY;  Surgeon: Wenona Hamilton, MD;  Location: [facility];  Service: Cardiovascular;  Laterality: N/A;   LEFT HEART CATHETERIZATION WITH CORONARY ANGIOGRAM N/A 04/23/2012   Procedure: LEFT HEART CATHETERIZATION WITH CORONARY ANGIOGRAM;  Surgeon: Peter M Swaziland, MD;  Location[facility] CATH LAB;  Service: Cardiovascular;  Laterality: N/A;   LOWER EXTREMITY ANGIOGRAM Left 08/31/2013   Procedure: LOWER EXTREMITY ANGIOGRAM;  Surgeon: Wenona Hamilton, MD;  Location: [facility] CATH [facility];  Service: Cardiovascular;  Laterality: Left;   PERCUTANEOUS STENT INTERVENTION Left 08/31/2013    Procedure: PERCUTANEOUS STENT  INTERVENTION;  Surgeon: Wenona Hamilton, MD;  Location: [facility] CATH LAB;  Service: Cardiovascular;  Laterality: Left;  Common Iliac artery   PERIPHERAL VASCULAR CATHETERIZATION N/A 03/19/2016   Procedure: Abdominal Aortogram w/Lower Extremity;  Surgeon: Wenona Hamilton, MD;  Location: [facility] INV[facility] CV LAB;  Service: Cardiovascular;  Laterality: N/A;   PERIPHERAL VASCULAR CATHETERIZATION  03/19/2016   Procedure: Peripheral Vascular Balloon Angioplasty-CFA Left;  Surgeon: Wenona Hamilton, MD;  Location: [facility] CV LAB;  Service: Cardiovascular;;   PERIPHERAL VASCULAR INTERVENTION Bilateral 03/16/2019   Procedure: PERIPHERAL VASCULAR INTERVENTION;  Surgeon: Wenona Hamilton, MD;  Location: [facility] CV [facility];  Service: Cardiovascular;  Laterality: Bilateral;  RENAL   PERIPHERAL VASCULAR INTERVENTION Bilateral 05/09/2020   Procedure: PERIPHERAL VASCULAR INTERVENTION;  Surgeon: Wenona Hamilton, MD;  Location: [facility] CV [facility];  Service: Cardiovascular;  Laterality: Bilateral;  Iliac   RENAL ANGIOGRAPHY  03/16/2019   RENAL ANGIOGRAPHY Bilateral 03/16/2019   Procedure: RENAL ANGIOGRAPHY;  Surgeon: Wenona Hamilton, MD;  Location: [facility] CV [facility];  Service: Cardiovascular;  Laterality: Bilateral;   RENAL ANGIOGRAPHY Bilateral 04/08/2023   Procedure: RENAL ANGIOGRAPHY;  Surgeon: Wenona Hamilton, MD;  Location: [facility] INVAS[facility] CV LAB;  Service: Cardiovascular;  Laterality: Bilateral;   TEE WITHOUT CARDIOVERSION N/A 06/25/2020   Procedure: TRANSESOPHAGEAL ECHOCARDIOGRAM (TEE);  Surgeon: Rudine Cos, MD;  Location: [facility] OR;  Service: Open Heart Surgery;  Laterality: N/A;   TUMOR REMOVAL     tumor removed from [place]   Family History Family History  Problem Relation Age of Onset   Diabetes Mother    Hyperlipidemia Mother    Hypertension Mother    Peripheral vascular disease Mother    Other Sister        drowned   Other Sister        drowned   Diabetes Daughter         borderline   Hypertension Daughter    Diabetes Maternal Grandmother    Heart attack Maternal Grandfather    Heart disease Brother    Diabetes Brother    Alcohol  abuse Brother    Diabetes Son    Hypertension Son    Hyperlipidemia Son    Breast cancer Neg Hx     Social History Social History   Tobacco Use   Smoking status: Former    Current packs/day: 0.00    Average packs/day: 1 pack/day for 36.8 years (36.8 ttl pk-yrs)    Types: Cigarettes    Start date: 04/21/1964    Quit date: 02/17/2001    Years since quitting: 22.3   Smokeless tobacco: Never  Vaping Use   Vaping status: Never Used  Substance Use Topics   Alcohol  use: No    Alcohol /week: 0.0 standard drinks of alcohol    Drug use: No   Allergies Tape  Review of Systems A thorough review of systems was obtained and all systems are negative except as noted in the HPI and PMH.   Physical Exam Vital Signs  I have reviewed the triage vital signs BP (!) 179/53 (BP Location: Right Arm)   Pulse (!) 55   Temp 98.2 F (36.8 C)   Resp 18   Ht 5\' 2"  (1.575 m)   Wt 92.9 kg   SpO2 98%   BMI 37.46 kg/m  Physical Exam Vitals and nursing note reviewed.  Constitutional:      General: She is not in acute distress.    Appearance: Normal appearance. She is obese.  HENT:  Head: Normocephalic and atraumatic.     Right Ear: External ear normal.     Left Ear: External ear normal.     Nose: Nose normal.     Mouth/Throat:     Mouth: Mucous membranes are moist.  Eyes:     General: No scleral icterus.       Right eye: No discharge.        Left eye: No discharge.  Cardiovascular:     Rate and Rhythm: Normal rate and regular rhythm.     Pulses: Normal pulses.     Heart sounds: Normal heart sounds.  Pulmonary:     Effort: Pulmonary effort is normal. No respiratory distress.     Breath sounds: No stridor. Decreased breath sounds present.     Comments: Trace wheeze b/l Abdominal:     General: Abdomen is flat. There is no  distension.     Palpations: Abdomen is soft.     Tenderness: There is no abdominal tenderness.  Musculoskeletal:     Cervical back: No rigidity.     Right lower leg: Edema present.     Left lower leg: Edema present.  Skin:    General: Skin is warm and dry.     Capillary Refill: Capillary refill takes less than 2 seconds.  Neurological:     Mental Status: She is alert.  Psychiatric:        Mood and Affect: Mood normal.        Behavior: Behavior normal. Behavior is cooperative.     ED Results and Treatments Labs (all labs ordered are listed, but only abnormal results are displayed) Labs Reviewed  BRAIN NATRIURETIC PEPTIDE - Abnormal; Notable for the following components:      Result Value   B Natriuretic Peptide 285.0 (*)    All other components within normal limits  CBC WITH DIFFERENTIAL/PLATELET - Abnormal; Notable for the following components:   RBC 3.70 (*)    Hemoglobin 11.5 (*)    HCT 35.8 (*)    All other components within normal limits  BASIC METABOLIC PANEL WITH GFR - Abnormal; Notable for the following components:   Sodium 131 (*)    Glucose, Bld 152 (*)    All other components within normal limits  BLOOD GAS, VENOUS - Abnormal; Notable for the following components:   pO2, Ven <31 (*)    Bicarbonate 37.6 (*)    Acid-Base Excess 11.0 (*)    All other components within normal limits  TROPONIN I (HIGH SENSITIVITY) - Abnormal; Notable for the following components:   Troponin I (High Sensitivity) 19 (*)    All other components within normal limits  TROPONIN I (HIGH SENSITIVITY)                                                                                                                          Radiology DG Chest 2 View Result Date: 06/05/2023 CLINICAL DATA:  Dyspnea EXAM: CHEST - 2 VIEW COMPARISON:  04/06/2023 FINDINGS: Post sternotomy changes. Cardiomegaly with vascular congestion and diffuse interstitial opacity favored to be secondary to edema. Possible tiny  pleural effusions. Aortic atherosclerosis. No pneumothorax IMPRESSION: Cardiomegaly with vascular congestion and diffuse interstitial opacity favored to be secondary to edema. Possible tiny pleural effusions. Electronically Signed   By: Esmeralda Hedge M.D.   On: 06/05/2023 16:42    Pertinent labs & imaging results that were available during my care of the patient were reviewed by me and considered in my medical decision making (see MDM for details).  Medications Ordered in ED Medications  ipratropium-albuterol  (DUONEB) 0.5-2.5 (3) MG/3ML nebulizer solution 3 mL (3 mLs Nebulization Given 06/05/23 1743)  furosemide  (LASIX ) injection 40 mg (40 mg Intravenous Given 06/05/23 1815)                                                                                                                                     Procedures .Critical Care  Performed by: Teddi Favors, DO Authorized by: Teddi Favors, DO   Critical care provider statement:    Critical care time (minutes):  44   Critical care time was exclusive of:  Separately billable procedures and treating other patients   Critical care was necessary to treat or prevent imminent or life-threatening deterioration of the following conditions:  Respiratory failure and cardiac failure   Critical care was time spent personally by me on the following activities:  Development of treatment plan with patient or surrogate, discussions with consultants, evaluation of patient's response to treatment, examination of patient, ordering and review of laboratory studies, ordering and review of radiographic studies, ordering and performing treatments and interventions, pulse oximetry, re-evaluation of patient's condition, review of old charts and obtaining history from patient or surrogate   Care discussed with: admitting provider     (including critical care time)  Medical Decision Making / ED Course    Medical Decision Making:    Leah Ferrell is a 73  y.o. female with past medical history as below, significant for CAD, diastolic CHF, DM, obesity, PVD, CABG x 3 who presents to the ED with complaint of dyspnea. The complaint involves an extensive differential diagnosis and also carries with it a high risk of complications and morbidity.  Serious etiology was considered. Ddx includes but is not limited to: In my evaluation of this patient's dyspnea my DDx includes, but is not limited to, pneumonia, pulmonary embolism, pneumothorax, pulmonary edema, metabolic acidosis, asthma, COPD, cardiac cause, anemia, anxiety, etc.    Complete initial physical exam performed, notably the patient was in no acute distress, no hypoxia.    Reviewed and confirmed nursing documentation for past medical history, family history, social history.  Vital signs reviewed.    Clinical Course as of 06/05/23 2012  Fri Jun 05, 2023  1656 CXR w/ pulm edema w/ pleural eff [SG]  1900 Patient failed ambulation, became hypoxic, tachypnea, conversational dyspnea.  She has evidence of fluid overload on imaging, mildly elevated BNP.  Troponin is also mildly elevated.  She was started on Lasix , she is history of diastolic heart failure.  Recommend admission for acute heart failure exacerbation.  She is agreeable [SG]    Clinical Course User Index [SG] Teddi Favors, DO    Brief summary: 73 year old female history as above here with acute on chronic exertional dyspnea.  She feels is worsened over the past 24 hours.  She is having worsening postnasal drip, cough, congestion overnight.  Nonproductive cough.  Symptoms worse when ambulating.  Labs reviewed, BNP somewhat elevated, chest x-ray concerning for fluid overload.  She does not have significant peripheral edema.  Will give nebulized breathing treatment given trace wheeze.  Give Lasix .  Check ambulatory pulse ox.  Admission for acute CHF, hypoxia.Started lasix . Pt would likely benefit from outpatient sleep study as  well.              Additional history obtained: -Additional history obtained from family -External records from outside source obtained and reviewed including: Chart review including previous notes, labs, imaging, consultation notes including  Primary care documentation, prior ED visits, home medications   Lab Tests: -I ordered, reviewed, and interpreted labs.   The pertinent results include:   Labs Reviewed  BRAIN NATRIURETIC PEPTIDE - Abnormal; Notable for the following components:      Result Value   B Natriuretic Peptide 285.0 (*)    All other components within normal limits  CBC WITH DIFFERENTIAL/PLATELET - Abnormal; Notable for the following components:   RBC 3.70 (*)    Hemoglobin 11.5 (*)    HCT 35.8 (*)    All other components within normal limits  BASIC METABOLIC PANEL WITH GFR - Abnormal; Notable for the following components:   Sodium 131 (*)    Glucose, Bld 152 (*)    All other components within normal limits  BLOOD GAS, VENOUS - Abnormal; Notable for the following components:   pO2, Ven <31 (*)    Bicarbonate 37.6 (*)    Acid-Base Excess 11.0 (*)    All other components within normal limits  TROPONIN I (HIGH SENSITIVITY) - Abnormal; Notable for the following components:   Troponin I (High Sensitivity) 19 (*)    All other components within normal limits  TROPONIN I (HIGH SENSITIVITY)    Notable for bnp mild elev, trop mild elev  EKG   EKG Interpretation Date/Time:  Friday June 05 2023 17:28:09 EDT Ventricular Rate:  48 PR Interval:  178 QRS Duration:  108 QT Interval:  475 QTC Calculation: 425 R Axis:   11  Text Interpretation: Sinus bradycardia Probable left atrial enlargement Nonspecific T abnormalities, lateral leads similar to prior no stemi Confirmed by Russella Courts ([id number]) on 06/05/2023 5:41:54 PM         Imaging Studies ordered: I ordered imaging studies including cxr I independently visualized the following imaging with scope of  interpretation limited to determining acute life threatening conditions related to emergency care; findings noted above I independently visualized and interpreted imaging. I agree with the radiologist interpretation   Medicines ordered and prescription drug management: Meds ordered this encounter  Medications   ipratropium-albuterol  (DUONEB) 0.5-2.5 (3) MG/3ML nebulizer solution 3 mL   furosemide  (LASIX ) injection 40 mg    -I have reviewed the patients home medicines and have made adjustments as needed   Consultations Obtained: na   Cardiac Monitoring: The patient was maintained on a cardiac monitor.  I  personally viewed and interpreted the cardiac monitored which showed an underlying rhythm of: sinus brady Continuous pulse oximetry interpreted by myself, 94% on 3L.    Social Determinants of Health:  Diagnosis or treatment significantly limited by social determinants of health: former smoker and obesity   Reevaluation: After the interventions noted above, I reevaluated the patient and found that they have improved  Co morbidities that complicate the patient evaluation  Past Medical History:  Diagnosis Date   CAD (coronary artery disease)    a. 04/2012 NSTEMI: s/p DES to LAD.   Chronic diastolic CHF (congestive heart failure) (HCC)    Diabetes mellitus without complication (HCC)    Dysrhythmia    Environmental and seasonal allergies    Hyperlipidemia    Hypertension    Leukocytosis    Morbid obesity (HCC)    NSTEMI (non-ST elevated myocardial infarction) (HCC) 04/27/2012   PONV (postoperative nausea and vomiting)    PVD (peripheral vascular disease) (HCC)    a. 04/2012 ABI: R 0.49, L 0.39. Angiography 06/14: Significant ostial left common iliac artery stenosis extending into the distal aorta, severe left common femoral artery stenosis, bilateral SFA occlusion with heavy calcifications. Functionally, one-vessel runoff bilaterally below the knee   Renal artery stenosis (HCC)     s/p angioplasty/stenting bilateral renal arteries 03/16/19   Shortness of breath       Dispostion: Disposition decision including need for hospitalization was considered, and patient admitted to the hospital.    Final Clinical Impression(s) / ED Diagnoses Final diagnoses:  Acute congestive heart failure, unspecified heart failure type (HCC)  Exertional dyspnea  Hypoxia        Teddi Favors, DO 06/05/23 2012

## 2023-06-05 NOTE — Telephone Encounter (Signed)
 Left detailed message for pt letting her know that appointment with Dr. Alvenia Aus has been scheduled for 4/22 at 11:40am. Advised pt to call back if she can not make this appointment. Callback number left.

## 2023-06-05 NOTE — Plan of Care (Signed)

## 2023-06-05 NOTE — Telephone Encounter (Signed)
 I agree with PCP evaluation, as constellation of symptoms does not clearly cardiac in nature.  However, if the patient develops worsening dyspnea or chest pain and evaluation for noncardiac causes by PCP is unrevealing, she should be seen by Dr. Alvenia Aus, APP, or DOD next week.  Sammy Crisp, MD [facility]

## 2023-06-05 NOTE — ED Triage Notes (Signed)
 Pt reports exertional dyspnea worse then normal since last night.

## 2023-06-05 NOTE — Telephone Encounter (Signed)
 Copied from CRM [id number]. Topic: Clinical - Red Word Triage >> Jun 05, 2023 12:11 PM Leah Ferrell J wrote: Red Word that prompted transfer to Nurse Triage: Shortness of breath/fatigue Reason for Disposition  [1] Longstanding difficulty breathing AND [2] not responding to usual therapy    Pt seen 3 days ago by PCP.  Answer Assessment - Initial Assessment Questions 1. RESPIRATORY STATUS: "Describe your breathing?" (e.g., wheezing, shortness of breath, unable to speak, severe coughing)      I'm short of breath and my legs are weak.   I can't catch my breath.   When I lay down I breath like asthma or pneumonia or something.     2. ONSET: "When did this breathing problem begin?"      (Noticed she has spoken with cardiologist's office a few min. Ago). 3. PATTERN "Does the difficult breathing come and go, or has it been constant since it started?"      Any exertion makes me short of breath 4. SEVERITY: "How bad is your breathing?" (e.g., mild, moderate, severe)    - MILD: No SOB at rest, mild SOB with walking, speaks normally in sentences, can lie down, no retractions, pulse < 100.    - MODERATE: SOB at rest, SOB with minimal exertion and prefers to sit, cannot lie down flat, speaks in phrases, mild retractions, audible wheezing, pulse 100-120.    - SEVERE: Very SOB at rest, speaks in single words, struggling to breathe, sitting hunched forward, retractions, pulse > 120      Moderate 5. RECURRENT SYMPTOM: "Have you had difficulty breathing before?" If Yes, ask: "When was the last time?" and "What happened that time?"      Yes 6. CARDIAC HISTORY: "Do you have any history of heart disease?" (e.g., heart attack, angina, bypass surgery, angioplasty)      I have 3 stents in my heart in Feb. 2025 7. LUNG HISTORY: "Do you have any history of lung disease?"  (e.g., pulmonary embolus, asthma, emphysema)     Shortness of breath 8. CAUSE: "What do you think is causing the breathing problem?"      "Something is  wrong with my lungs"   My heart doctor told me to call Tommas Fragmin, FNP 9. OTHER SYMPTOMS: "Do you have any other symptoms? (e.g., dizziness, runny nose, cough, chest pain, fever)     Shortness of breath and leg weakness 10. O2 SATURATION MONITOR:  "Do you use an oxygen saturation monitor (pulse oximeter) at home?" If Yes, ask: "What is your reading (oxygen level) today?" "What is your usual oxygen saturation reading?" (e.g., 95%)       N/A 11. PREGNANCY: "Is there any chance you are pregnant?" "When was your last menstrual period?"       N/A 12. TRAVEL: "Have you traveled out of the country in the last month?" (e.g., travel history, exposures)       N/A  Protocols used: Breathing Difficulty-A-AH  Chief Complaint: Shortness of breath and swelling and weakness in her legs.   She was rambling about going to rehab that she was going to go and then she is not going to go.   Mentioned something about her daughter taking her to rehab or the ED.   It was hard to triage her as she was jumping from one subject to another quickly.    I noticed in her chart she had just called her cardiologist's office.   I asked her what did they tell her.   She  said they told her to call Tommas Fragmin, FNP and let her know what is going on.   She finally said "I really need to go now".   "Thank you for listening to me".   "I really need to go".     I let her know she needed to go to the ED for the shortness of breath and leg swelling and weakness.  She did not commit one way or the other.  [facility] is closed for the [place] holiday today.   So advised her to go to the ED. Symptoms: Shortness of breath Frequency: Last night and today. Pertinent Negatives: Patient denies knowing why she is short of breath.   "I've got 3 stents in my heart" when I asked her about heart problems. Disposition: [x] ED /[] Urgent Care (no appt availability in office) / [] Appointment(In office/virtual)/ []  [place]  Virtual [facility]/ [] Home Care/ [] Refused Recommended Disposition /[] [place] [facility]/ []  Follow-up with PCP Additional Notes: Referred to the ED since [facility] is closed for the Easter holiday and she is having shortness of breath, leg swelling and weakness.

## 2023-06-06 ENCOUNTER — Inpatient Hospital Stay (HOSPITAL_COMMUNITY)

## 2023-06-06 DIAGNOSIS — I5031 Acute diastolic (congestive) heart failure: Secondary | ICD-10-CM

## 2023-06-06 DIAGNOSIS — I5033 Acute on chronic diastolic (congestive) heart failure: Secondary | ICD-10-CM | POA: Diagnosis not present

## 2023-06-06 LAB — COMPREHENSIVE METABOLIC PANEL WITH GFR
ALT: 33 U/L (ref 0–44)
AST: 21 U/L (ref 15–41)
Albumin: 3.3 g/dL — ABNORMAL LOW (ref 3.5–5.0)
Alkaline Phosphatase: 49 U/L (ref 38–126)
Anion gap: 8 (ref 5–15)
BUN: 16 mg/dL (ref 8–23)
CO2: 31 mmol/L (ref 22–32)
Calcium: 9.3 mg/dL (ref 8.9–10.3)
Chloride: 98 mmol/L (ref 98–111)
Creatinine, Ser: 0.89 mg/dL (ref 0.44–1.00)
GFR, Estimated: >60 mL/min (ref >60)
Glucose, Bld: 184 mg/dL — ABNORMAL HIGH (ref 70–99)
Potassium: 4 mmol/L (ref 3.5–5.1)
Sodium: 137 mmol/L (ref 135–145)
Total Bilirubin: 0.5 mg/dL (ref 0.0–1.2)
Total Protein: 7 g/dL (ref 6.5–8.1)

## 2023-06-06 LAB — ECHOCARDIOGRAM LIMITED
Height: 62 in
S' Lateral: 3.4 cm
Weight: 3216.95 [oz_av]

## 2023-06-06 LAB — GLUCOSE, CAPILLARY
Glucose-Capillary: 168 mg/dL — ABNORMAL HIGH (ref 70–99)
Glucose-Capillary: 254 mg/dL — ABNORMAL HIGH (ref 70–99)
Glucose-Capillary: 306 mg/dL — ABNORMAL HIGH (ref 70–99)
Glucose-Capillary: 204 mg/dL — ABNORMAL HIGH (ref 70–99)

## 2023-06-06 LAB — CBC
HCT: 35 % — ABNORMAL LOW (ref 36.0–46.0)
Hemoglobin: 11.6 g/dL — ABNORMAL LOW (ref 12.0–15.0)
MCH: 31.6 pg (ref 26.0–34.0)
MCHC: 33.1 g/dL (ref 30.0–36.0)
MCV: 95.4 fL (ref 80.0–100.0)
Platelets: 203 10*3/uL (ref 150–400)
RBC: 3.67 MIL/uL — ABNORMAL LOW (ref 3.87–5.11)
RDW: 15.1 % (ref 11.5–15.5)
WBC: 8.3 10*3/uL (ref 4.0–10.5)
nRBC: 0 % (ref 0.0–0.2)

## 2023-06-06 LAB — PHOSPHORUS: Phosphorus: 3.1 mg/dL (ref 2.5–4.6)

## 2023-06-06 LAB — HEMOGLOBIN A1C
Hgb A1c MFr Bld: 7.4 % — ABNORMAL HIGH (ref 4.8–5.6)
Mean Plasma Glucose: 165.68 mg/dL

## 2023-06-06 LAB — MAGNESIUM: Magnesium: 2 mg/dL (ref 1.7–2.4)

## 2023-06-06 LAB — TROPONIN I (HIGH SENSITIVITY): Troponin I (High Sensitivity): 28 ng/L — ABNORMAL HIGH (ref <18)

## 2023-06-06 MED ORDER — CARVEDILOL 3.125 MG PO TABS
3.1250 mg | ORAL_TABLET | Freq: Two times a day (BID) | ORAL | Status: DC
  Administered 2023-06-07 – 2023-06-08 (×3): 3.125 mg via ORAL
  Filled 2023-06-06 (×3): qty 1

## 2023-06-06 NOTE — Progress Notes (Signed)
  Echocardiogram 2D Echocardiogram has been performed.  Leah Ferrell 06/06/2023, 10:25 AM

## 2023-06-06 NOTE — Progress Notes (Signed)
 PROGRESS NOTE     Leah Ferrell, is a 73 y.o. female, DOB - 1950-10-21, YNW:[id number]  Admit date - 06/05/2023   Admitting Physician Twilla Galea, DO  Outpatient Primary MD for the patient is Yevette Hem, FNP  LOS - 1  Chief Complaint  Patient presents with   exertional dyspnea      Brief Narrative:  73 y.o. female with medical history significant of CAD s/p DES, chronic diastolic CHF, HTN and HLD, GERD, T2DM admitted on 06/05/2022 with acute diastolic CHF exacerbation    -Assessment and Plan: 1) acute diastolic CHF exacerbation--clinical findings, chest x-ray finding, BNP all support CHF exacerbation -Echo on 06/06/2023 with EF of 60-65 %, no regional wall motion abnormalities, no mitral stenosis,  prior echo from February 2025 with grade 1 diastolic dysfunction. - Patient has ruled out for ACS by cardiac enzymes and EKG - Continue IV Lasix  -Continue Aldactone  and lisinopril  --Strict I's and O's and daily weights  2)CAD--- status post CABG, status post prior PCI with stenting -Chest pain-free--rule out for ACS as above #1 - Continue Plavix  and Crestor  - Add Coreg   3) acute hypoxic respiratory failure--due to #1 above -Currently requiring 3 L per nasal cannula -Try to wean off oxygen with diuresis  4)DM2----A1c 7.4 reflecting uncontrolled DM with hyperglycemia prior to admission - Hold Ozempic   - Give Semglee  8 units nightly Use Novolog /Humalog  Sliding scale insulin  with Accu-Cheks/Fingersticks as ordered   5)HTN--continue lisinopril , amlodipine , Lasix  and Aldactone  - Add Coreg  as above #2  6)GERD--continue Protonix   Status is: Inpatient   Disposition: The patient is from: Home              Anticipated d/c is to: Home              Anticipated d/c date is: 1 day              Patient currently is not medically stable to d/c. Barriers: Not Clinically Stable-   Code Status :  -  Code Status: Full Code   Family Communication:    (patient is alert, awake and  coherent)  Discussed with daughter at bedside  DVT Prophylaxis  :   - SCDs  SCDs Start: 06/05/23 2053   Lab Results  Component Value Date   PLT 203 06/06/2023   Inpatient Medications  Scheduled Meds:  amLODipine   10 mg Oral Daily   clopidogrel   75 mg Oral Daily   enoxaparin  (LOVENOX ) injection  50 mg Subcutaneous Q24H   furosemide   40 mg Intravenous Q12H   insulin  aspart  0-15 Units Subcutaneous TID WC   insulin  aspart  0-5 Units Subcutaneous QHS   insulin  glargine-yfgn  8 Units Subcutaneous QHS   lisinopril   20 mg Oral Daily   pantoprazole   40 mg Oral Daily   rosuvastatin   40 mg Oral Daily   spironolactone   25 mg Oral Daily   Continuous Infusions: PRN Meds:.acetaminophen  **OR** acetaminophen , albuterol , ondansetron  **OR** ondansetron  (ZOFRAN ) IV   Anti-infectives (From admission, onward)    None      Subjective: Leah Ferrell today has no fevers, no emesis,  No chest pain,   - Daughter Leah Ferrell at bedside - No chest pains - Dyspnea at rest improved -Dyspnea on exertion persist - Voiding well--urine output not documented as patient is voiding into the toilet bowl  Objective: Vitals:   06/06/23 0511 06/06/23 0514 06/06/23 0833 06/06/23 1319  BP: (!) 142/45  (!) 146/53 (!) 156/45  Pulse: (!) 51  (!) 50 Leah Ferrell)  50  Resp: 19  18   Temp: 97.8 F (36.6 C)  98.5 F (36.9 C) 98.5 F (36.9 C)  TempSrc: Oral  Oral Oral  SpO2: 95%  100% 97%  Weight:  91.2 kg    Height:        Intake/Output Summary (Last 24 hours) at 06/06/2023 1516 Last data filed at 06/06/2023 1300 Gross per 24 hour  Intake 600 ml  Output 650 ml  Net -50 ml   Filed Weights   06/05/23 1336 06/06/23 0514  Weight: 92.9 kg 91.2 kg   Physical Exam Gen:- Awake Alert,  in no apparent distress, some dyspnea on exertion persist HEENT:- [place].AT, No sclera icterus Nose- [place] 3L/min Neck-Supple Neck,No JVD,.  Lungs-  CTAB , fair symmetrical air movement CV- S1, S2 normal, regular  Abd-  +ve B.Sounds, Abd  Soft, No tenderness,    Extremity/Skin:- +ve  edema, pedal pulses present  Psych-affect is appropriate, oriented x3 Neuro-no new focal deficits, no tremors  Data Reviewed: I have personally reviewed following labs and imaging studies  CBC: Recent Labs  Lab 06/05/23 1423 06/06/23 0533  WBC 8.7 8.3  NEUTROABS 5.9  --   HGB 11.5* 11.6*  HCT 35.8* 35.0*  MCV 96.8 95.4  PLT 195 203   Basic Metabolic Panel: Recent Labs  Lab 06/05/23 1423 06/06/23 0533  NA 131* 137  K 4.3 4.0  CL 99 98  CO2 27 31  GLUCOSE 152* 184*  BUN 17 16  CREATININE 0.73 0.89  CALCIUM  9.0 9.3  MG  --  2.0  PHOS  --  3.1   GFR: Estimated Creatinine Clearance: 60 mL/min (by C-G formula based on SCr of 0.89 mg/dL). Liver Function Tests: Recent Labs  Lab 06/06/23 0533  AST 21  ALT 33  ALKPHOS 49  BILITOT 0.5  PROT 7.0  ALBUMIN  3.3*   HbA1C: Recent Labs    06/06/23 0533  HGBA1C 7.4*   Radiology Studies: ECHOCARDIOGRAM LIMITED Result Date: 06/06/2023    ECHOCARDIOGRAM LIMITED REPORT   Patient Name:   Leah Ferrell Date of Exam: 06/06/2023 Medical Rec #:  [id number]         Height:       62.0 in Accession #:    [id number]        Weight:       201.1 lb Date of Birth:  26-Nov-1950          BSA:          1.916 m Patient Age:    73 years          BP:           142/45 mmHg Patient Gender: F                 HR:           50 bpm. Exam Location:  [facility] Procedure: Limited Echo, Cardiac Doppler and Color Doppler (Both Spectral and            Color Flow Doppler were utilized during procedure). Indications:    CHF Acute Diastolic  History:        Patient has prior history of Echocardiogram examinations, most                 recent 04/08/2023.  Sonographer:    Reta Cassis Referring Phys: [id number] OLADAPO ADEFESO IMPRESSIONS  1. Left ventricular ejection fraction, by estimation, is 60 to 65%. The left ventricle has normal function. The left  ventricle has no regional wall motion abnormalities. Left ventricular  diastolic function could not be evaluated.  2. Right ventricular systolic function is normal. The right ventricular size is normal. Tricuspid regurgitation signal is inadequate for assessing PA pressure.  3. Left atrial size was moderately dilated.  4. The mitral valve is degenerative. Trivial mitral valve regurgitation. No evidence of mitral stenosis. Moderate mitral annular calcification.  5. The aortic valve was not well visualized.  6. The inferior vena cava is normal in size with greater than 50% respiratory variability, suggesting right atrial pressure of 3 mmHg. Comparison(s): No significant change from prior study. FINDINGS  Left Ventricle: Left ventricular ejection fraction, by estimation, is 60 to 65%. The left ventricle has normal function. The left ventricle has no regional wall motion abnormalities. The left ventricular internal cavity size was normal in size. There is  no left ventricular hypertrophy. Left ventricular diastolic function could not be evaluated. Right Ventricle: The right ventricular size is normal. No increase in right ventricular wall thickness. Right ventricular systolic function is normal. Tricuspid regurgitation signal is inadequate for assessing PA pressure. Left Atrium: Left atrial size was moderately dilated. Pericardium: There is no evidence of pericardial effusion. Mitral Valve: The mitral valve is degenerative in appearance. Moderate mitral annular calcification. Trivial mitral valve regurgitation. No evidence of mitral valve stenosis. MV peak gradient, 10.8 mmHg. The mean mitral valve gradient is 3.0 mmHg. Tricuspid Valve: The tricuspid valve is grossly normal. Tricuspid valve regurgitation is trivial. No evidence of tricuspid stenosis. Aortic Valve: The aortic valve was not well visualized. Pulmonic Valve: The pulmonic valve was grossly normal. Pulmonic valve regurgitation is not visualized. No evidence of pulmonic stenosis. Venous: The inferior vena cava is normal in size  with greater than 50% respiratory variability, suggesting right atrial pressure of 3 mmHg. Additional Comments: Spectral Doppler performed. Color Doppler performed.  LEFT VENTRICLE PLAX 2D LVIDd:         4.70 cm LVIDs:         3.40 cm LV PW:         1.00 cm LV IVS:        1.00 cm LVOT diam:     2.00 cm LVOT Area:     3.14 cm  IVC IVC diam: 1.70 cm LEFT ATRIUM           Index LA diam:      4.30 cm 2.24 cm/m LA Vol (A4C): 91.2 ml 47.60 ml/m   AORTA Ao Root diam: 2.00 cm MITRAL VALVE MV Peak grad: 10.8 mmHg SHUNTS MV Mean grad: 3.0 mmHg  Systemic Diam: 2.00 cm MV Vmax:      1.64 m/s MV Vmean:     67.8 cm/s Jackquelyn Mass MD Electronically signed by Jackquelyn Mass MD Signature Date/Time: 06/06/2023/12:49:12 PM    Final    DG Chest 2 View Result Date: 06/05/2023 CLINICAL DATA:  Dyspnea EXAM: CHEST - 2 VIEW COMPARISON:  04/06/2023 FINDINGS: Post sternotomy changes. Cardiomegaly with vascular congestion and diffuse interstitial opacity favored to be secondary to edema. Possible tiny pleural effusions. Aortic atherosclerosis. No pneumothorax IMPRESSION: Cardiomegaly with vascular congestion and diffuse interstitial opacity favored to be secondary to edema. Possible tiny pleural effusions. Electronically Signed   By: Esmeralda Hedge M.D.   On: 06/05/2023 16:42   Scheduled Meds:  amLODipine   10 mg Oral Daily   clopidogrel   75 mg Oral Daily   enoxaparin  (LOVENOX ) injection  50 mg Subcutaneous Q24H   furosemide   40 mg Intravenous Q12H  insulin  aspart  0-15 Units Subcutaneous TID WC   insulin  aspart  0-5 Units Subcutaneous QHS   insulin  glargine-yfgn  8 Units Subcutaneous QHS   lisinopril   20 mg Oral Daily   pantoprazole   40 mg Oral Daily   rosuvastatin   40 mg Oral Daily   spironolactone   25 mg Oral Daily  Continuous Infusions:   LOS: 1 day   Colin Dawley M.D on 06/06/2023 at 3:16 PM  Go to [email or web address] - for contact info  [facility] - Office  [phone]  If 7PM-7AM, please contact  night-coverage [email or web address] 06/06/2023, 3:16 PM

## 2023-06-07 DIAGNOSIS — I5033 Acute on chronic diastolic (congestive) heart failure: Secondary | ICD-10-CM | POA: Diagnosis not present

## 2023-06-07 LAB — BASIC METABOLIC PANEL WITH GFR
Anion gap: 7 (ref 5–15)
BUN: 18 mg/dL (ref 8–23)
CO2: 31 mmol/L (ref 22–32)
Calcium: 9 mg/dL (ref 8.9–10.3)
Chloride: 94 mmol/L — ABNORMAL LOW (ref 98–111)
Creatinine, Ser: 0.95 mg/dL (ref 0.44–1.00)
GFR, Estimated: >60 mL/min (ref >60)
Glucose, Bld: 203 mg/dL — ABNORMAL HIGH (ref 70–99)
Potassium: 4.4 mmol/L (ref 3.5–5.1)
Sodium: 132 mmol/L — ABNORMAL LOW (ref 135–145)

## 2023-06-07 LAB — GLUCOSE, CAPILLARY
Glucose-Capillary: 200 mg/dL — ABNORMAL HIGH (ref 70–99)
Glucose-Capillary: 224 mg/dL — ABNORMAL HIGH (ref 70–99)
Glucose-Capillary: 428 mg/dL — ABNORMAL HIGH (ref 70–99)
Glucose-Capillary: 172 mg/dL — ABNORMAL HIGH (ref 70–99)

## 2023-06-07 MED ORDER — INSULIN GLARGINE-YFGN 100 UNIT/ML ~~LOC~~ SOLN
20.0000 [IU] | Freq: Every day | SUBCUTANEOUS | Status: DC
  Administered 2023-06-07: 20 [IU] via SUBCUTANEOUS
  Filled 2023-06-07 (×2): qty 0.2

## 2023-06-07 MED ORDER — INSULIN ASPART 100 UNIT/ML IJ SOLN
20.0000 [IU] | Freq: Once | INTRAMUSCULAR | Status: AC
  Administered 2023-06-07: 20 [IU] via SUBCUTANEOUS

## 2023-06-07 MED ORDER — FUROSEMIDE 10 MG/ML IJ SOLN
20.0000 mg | Freq: Two times a day (BID) | INTRAMUSCULAR | Status: DC
  Administered 2023-06-07 – 2023-06-08 (×2): 20 mg via INTRAVENOUS
  Filled 2023-06-07 (×2): qty 2

## 2023-06-07 NOTE — Plan of Care (Signed)
  Problem: Education: Goal: Knowledge of General Education information will improve Description: Including pain rating scale, medication(s)/side effects and non-pharmacologic comfort measures Outcome: Progressing   Problem: Health Behavior/Discharge Planning: Goal: Ability to manage health-related needs will improve Outcome: Progressing   Problem: Clinical Measurements: Goal: Ability to maintain clinical measurements within normal limits will improve Outcome: Progressing Goal: Will remain free from infection Outcome: Progressing Goal: Diagnostic test results will improve Outcome: Progressing Goal: Respiratory complications will improve Outcome: Progressing   Problem: Activity: Goal: Risk for activity intolerance will decrease Outcome: Progressing   Problem: Nutrition: Goal: Adequate nutrition will be maintained Outcome: Progressing   Problem: Coping: Goal: Level of anxiety will decrease Outcome: Progressing   Problem: Elimination: Goal: Will not experience complications related to bowel motility Outcome: Progressing Goal: Will not experience complications related to urinary retention Outcome: Progressing   Problem: Pain Managment: Goal: General experience of comfort will improve and/or be controlled Outcome: Progressing   Problem: Safety: Goal: Ability to remain free from injury will improve Outcome: Progressing   Problem: Skin Integrity: Goal: Risk for impaired skin integrity will decrease Outcome: Progressing   Problem: Coping: Goal: Ability to adjust to condition or change in health will improve Outcome: Progressing   Problem: Fluid Volume: Goal: Ability to maintain a balanced intake and output will improve Outcome: Progressing   Problem: Health Behavior/Discharge Planning: Goal: Ability to manage health-related needs will improve Outcome: Progressing   Problem: Metabolic: Goal: Ability to maintain appropriate glucose levels will improve Outcome:  Progressing   Problem: Tissue Perfusion: Goal: Adequacy of tissue perfusion will improve Outcome: Progressing

## 2023-06-07 NOTE — Progress Notes (Addendum)
 PROGRESS NOTE  Leah Ferrell, is a 73 y.o. female, DOB - [birth year], ION:[id number]  Admit date - 06/05/2023   Admitting Physician Twilla Galea, DO  Outpatient Primary MD for the patient is Yevette Hem, FNP  LOS - 2  Chief Complaint  Patient presents with   exertional dyspnea      Brief Narrative:  73 y.o. female with medical history significant of CAD s/p DES, chronic diastolic CHF, HTN and HLD, GERD, T2DM admitted on 06/05/2022 with acute diastolic CHF exacerbation    -Assessment and Plan: 1) acute diastolic CHF exacerbation--clinical findings, chest x-ray finding, BNP all support CHF exacerbation -Echo on 06/06/2023 with EF of 60-65 %, no regional wall motion abnormalities, no mitral stenosis,  prior echo from February 2025 with grade 1 diastolic dysfunction. - Patient has ruled out for ACS by cardiac enzymes and EKG -Continue Aldactone  and lisinopril  --Strict I's and O's and daily weights 06/07/23 - No further dyspnea at rest, voiding well Had some dyspnea on exertion O2 sat dropped to 86% on room air with ambulation Wt 201 >> 199 lb - Decrease IV Lasix  to 20 mg every 12 from 40 mg  2)CAD--- status post CABG, status post prior PCI with stenting -Chest pain-free--rule out for ACS as above #1 - Continue Plavix  , Coreg  and Crestor   3) acute hypoxic respiratory failure--due to #1 above -Oxygen weaned down to 2 L please see 1 above for desaturation while ambulating -Try to wean off oxygen with diuresis  4)DM2----A1c 7.4 reflecting uncontrolled DM with hyperglycemia prior to admission - Hold Ozempic   - Increase Semglee  to 20 units Use Novolog /Humalog  Sliding scale insulin  with Accu-Cheks/Fingersticks as ordered   5)HTN--continue lisinopril , amlodipine , Lasix  and Aldactone  -  Coreg  as above #2  6)GERD--continue Protonix   Status is: Inpatient   Disposition: The patient is from: Home              Anticipated d/c is to: Home              Anticipated d/c date is: 1  day              Patient currently is not medically stable to d/c. Barriers: Not Clinically Stable-   Code Status :  -  Code Status: Full Code   Family Communication:    (patient is alert, awake and coherent)  Discussed with daughter at bedside  DVT Prophylaxis  :   - SCDs  SCDs Start: 06/05/23 2053   Lab Results  Component Value Date   PLT 203 06/06/2023   Inpatient Medications  Scheduled Meds:  amLODipine   10 mg Oral Daily   carvedilol   3.125 mg Oral BID WC   clopidogrel   75 mg Oral Daily   enoxaparin  (LOVENOX ) injection  50 mg Subcutaneous Q24H   furosemide   20 mg Intravenous Q12H   insulin  aspart  0-15 Units Subcutaneous TID WC   insulin  aspart  0-5 Units Subcutaneous QHS   insulin  aspart  20 Units Subcutaneous Once   insulin  glargine-yfgn  20 Units Subcutaneous Daily   lisinopril   20 mg Oral Daily   pantoprazole   40 mg Oral Daily   rosuvastatin   40 mg Oral Daily   spironolactone   25 mg Oral Daily   Continuous Infusions: PRN Meds:.acetaminophen  **OR** acetaminophen , albuterol , ondansetron  **OR** ondansetron  (ZOFRAN ) IV   Anti-infectives (From admission, onward)    None      Subjective: Leah Ferrell today has no fevers, no emesis,  No chest pain,   - - Voiding  well, no further orthopnea, no dyspnea at rest - Had some dyspnea on exertion O2 sat dropped to 86% on room air with ambulation  Objective: Vitals:   06/07/23 0400 06/07/23 0500 06/07/23 0800 06/07/23 1233  BP: (!) 148/59   (!) 145/58  Pulse: 60   (!) 51  Resp:   16   Temp: 98.4 F (36.9 C)   98 F (36.7 C)  TempSrc: Oral   Oral  SpO2: 97%   99%  Weight:  90.6 kg    Height:        Intake/Output Summary (Last 24 hours) at 06/07/2023 1639 Last data filed at 06/07/2023 1240 Gross per 24 hour  Intake 597 ml  Output 1700 ml  Net -1103 ml   Filed Weights   06/05/23 1336 06/06/23 0514 06/07/23 0500  Weight: 92.9 kg 91.2 kg 90.6 kg   Physical Exam Gen:- Awake Alert,  in no apparent  distress, some dyspnea on exertion persist HEENT:- [place].AT, No sclera icterus Nose- [place] 2L/min Neck-Supple Neck,No JVD,.  Lungs-  CTAB , fair symmetrical air movement CV- S1, S2 normal, regular , prior CABG scar Abd-  +ve B.Sounds, Abd Soft, No tenderness,    Extremity/Skin:-Improving edema, pedal pulses present  Psych-affect is appropriate, oriented x3 Neuro-no new focal deficits, no tremors  Data Reviewed: I have personally reviewed following labs and imaging studies  CBC: Recent Labs  Lab 06/05/23 1423 06/06/23 0533  WBC 8.7 8.3  NEUTROABS 5.9  --   HGB 11.5* 11.6*  HCT 35.8* 35.0*  MCV 96.8 95.4  PLT 195 203   Basic Metabolic Panel: Recent Labs  Lab 06/05/23 1423 06/06/23 0533 06/07/23 0833  NA 131* 137 132*  K 4.3 4.0 4.4  CL 99 98 94*  CO2 27 31 31   GLUCOSE 152* 184* 203*  BUN 17 16 18   CREATININE 0.73 0.89 0.95  CALCIUM  9.0 9.3 9.0  MG  --  2.0  --   PHOS  --  3.1  --    GFR: Estimated Creatinine Clearance: 56 mL/min (by C-G formula based on SCr of 0.95 mg/dL). Liver Function Tests: Recent Labs  Lab 06/06/23 0533  AST 21  ALT 33  ALKPHOS 49  BILITOT 0.5  PROT 7.0  ALBUMIN  3.3*   HbA1C: Recent Labs    06/06/23 0533  HGBA1C 7.4*   Radiology Studies: ECHOCARDIOGRAM LIMITED Result Date: 06/06/2023    ECHOCARDIOGRAM LIMITED REPORT   Patient Name:   Leah Ferrell Date of Exam: 06/06/2023 Medical Rec #:  [id number]         Height:       62.0 in Accession #:    [id number]        Weight:       201.1 lb Date of Birth:  1950-11-29          BSA:          1.916 m Patient Age:    73 years          BP:           142/45 mmHg Patient Gender: F                 HR:           50 bpm. Exam Location:  [facility] Procedure: Limited Echo, Cardiac Doppler and Color Doppler (Both Spectral and            Color Flow Doppler were utilized during procedure). Indications:    CHF Acute  Diastolic  History:        Patient has prior history of Echocardiogram examinations, most                  recent 04/08/2023.  Sonographer:    Reta Cassis Referring Phys: [id number] OLADAPO ADEFESO IMPRESSIONS  1. Left ventricular ejection fraction, by estimation, is 60 to 65%. The left ventricle has normal function. The left ventricle has no regional wall motion abnormalities. Left ventricular diastolic function could not be evaluated.  2. Right ventricular systolic function is normal. The right ventricular size is normal. Tricuspid regurgitation signal is inadequate for assessing PA pressure.  3. Left atrial size was moderately dilated.  4. The mitral valve is degenerative. Trivial mitral valve regurgitation. No evidence of mitral stenosis. Moderate mitral annular calcification.  5. The aortic valve was not well visualized.  6. The inferior vena cava is normal in size with greater than 50% respiratory variability, suggesting right atrial pressure of 3 mmHg. Comparison(s): No significant change from prior study. FINDINGS  Left Ventricle: Left ventricular ejection fraction, by estimation, is 60 to 65%. The left ventricle has normal function. The left ventricle has no regional wall motion abnormalities. The left ventricular internal cavity size was normal in size. There is  no left ventricular hypertrophy. Left ventricular diastolic function could not be evaluated. Right Ventricle: The right ventricular size is normal. No increase in right ventricular wall thickness. Right ventricular systolic function is normal. Tricuspid regurgitation signal is inadequate for assessing PA pressure. Left Atrium: Left atrial size was moderately dilated. Pericardium: There is no evidence of pericardial effusion. Mitral Valve: The mitral valve is degenerative in appearance. Moderate mitral annular calcification. Trivial mitral valve regurgitation. No evidence of mitral valve stenosis. MV peak gradient, 10.8 mmHg. The mean mitral valve gradient is 3.0 mmHg. Tricuspid Valve: The tricuspid valve is grossly normal. Tricuspid valve  regurgitation is trivial. No evidence of tricuspid stenosis. Aortic Valve: The aortic valve was not well visualized. Pulmonic Valve: The pulmonic valve was grossly normal. Pulmonic valve regurgitation is not visualized. No evidence of pulmonic stenosis. Venous: The inferior vena cava is normal in size with greater than 50% respiratory variability, suggesting right atrial pressure of 3 mmHg. Additional Comments: Spectral Doppler performed. Color Doppler performed.  LEFT VENTRICLE PLAX 2D LVIDd:         4.70 cm LVIDs:         3.40 cm LV PW:         1.00 cm LV IVS:        1.00 cm LVOT diam:     2.00 cm LVOT Area:     3.14 cm  IVC IVC diam: 1.70 cm LEFT ATRIUM           Index LA diam:      4.30 cm 2.24 cm/m LA Vol (A4C): 91.2 ml 47.60 ml/m   AORTA Ao Root diam: 2.00 cm MITRAL VALVE MV Peak grad: 10.8 mmHg SHUNTS MV Mean grad: 3.0 mmHg  Systemic Diam: 2.00 cm MV Vmax:      1.64 m/s MV Vmean:     67.8 cm/s Jackquelyn Mass MD Electronically signed by Jackquelyn Mass MD Signature Date/Time: 06/06/2023/12:49:12 PM    Final    Scheduled Meds:  amLODipine   10 mg Oral Daily   carvedilol   3.125 mg Oral BID WC   clopidogrel   75 mg Oral Daily   enoxaparin  (LOVENOX ) injection  50 mg Subcutaneous Q24H   furosemide   20 mg Intravenous Q12H  insulin  aspart  0-15 Units Subcutaneous TID WC   insulin  aspart  0-5 Units Subcutaneous QHS   insulin  aspart  20 Units Subcutaneous Once   insulin  glargine-yfgn  20 Units Subcutaneous Daily   lisinopril   20 mg Oral Daily   pantoprazole   40 mg Oral Daily   rosuvastatin   40 mg Oral Daily   spironolactone   25 mg Oral Daily  Continuous Infusions:   LOS: 2 days   Colin Dawley M.D on 06/07/2023 at 4:39 PM  Go to [email or web address] - for contact info  [facility] - Office  [phone]  If 7PM-7AM, please contact night-coverage [email or web address] 06/07/2023, 4:39 PM

## 2023-06-07 NOTE — Progress Notes (Signed)
 Assisted patient with ambulating in hallway , without 2L of oxygen. Pt tolerated well but  O2 saturation dropped to 86%.   2L placed back on patient, she is in chair resting at this time.

## 2023-06-07 NOTE — Plan of Care (Signed)
  Problem: Education: Goal: Knowledge of General Education information will improve Description: Including pain rating scale, medication(s)/side effects and non-pharmacologic comfort measures Outcome: Progressing   Problem: Health Behavior/Discharge Planning: Goal: Ability to manage health-related needs will improve Outcome: Progressing   Problem: Clinical Measurements: Goal: Ability to maintain clinical measurements within normal limits will improve Outcome: Progressing Goal: Will remain free from infection Outcome: Progressing Goal: Diagnostic test results will improve Outcome: Progressing Goal: Respiratory complications will improve Outcome: Progressing   Problem: Elimination: Goal: Will not experience complications related to bowel motility Outcome: Progressing Goal: Will not experience complications related to urinary retention Outcome: Progressing   Problem: Skin Integrity: Goal: Risk for impaired skin integrity will decrease Outcome: Progressing   Problem: Coping: Goal: Ability to adjust to condition or change in health will improve Outcome: Progressing   Problem: Fluid Volume: Goal: Ability to maintain a balanced intake and output will improve Outcome: Progressing   Problem: Metabolic: Goal: Ability to maintain appropriate glucose levels will improve Outcome: Progressing   Problem: Tissue Perfusion: Goal: Adequacy of tissue perfusion will improve Outcome: Progressing

## 2023-06-08 ENCOUNTER — Encounter (HOSPITAL_COMMUNITY)
Admission: RE | Admit: 2023-06-08 | Discharge: 2023-06-08 | Disposition: A | Discharge status: Home / Self Care | Source: Ambulatory Visit | Attending: Cardiovascular Disease | Admitting: Cardiovascular Disease

## 2023-06-08 DIAGNOSIS — I5033 Acute on chronic diastolic (congestive) heart failure: Secondary | ICD-10-CM | POA: Diagnosis not present

## 2023-06-08 LAB — BASIC METABOLIC PANEL WITH GFR
Anion gap: 11 (ref 5–15)
BUN: 23 mg/dL (ref 8–23)
CO2: 30 mmol/L (ref 22–32)
Calcium: 9.3 mg/dL (ref 8.9–10.3)
Chloride: 94 mmol/L — ABNORMAL LOW (ref 98–111)
Creatinine, Ser: 0.86 mg/dL (ref 0.44–1.00)
GFR, Estimated: >60 mL/min (ref >60)
Glucose, Bld: 164 mg/dL — ABNORMAL HIGH (ref 70–99)
Potassium: 3.6 mmol/L (ref 3.5–5.1)
Sodium: 135 mmol/L (ref 135–145)

## 2023-06-08 LAB — GLUCOSE, CAPILLARY: Glucose-Capillary: 203 mg/dL — ABNORMAL HIGH (ref 70–99)

## 2023-06-08 MED ORDER — FUROSEMIDE 40 MG PO TABS: 40.0000 mg | ORAL_TABLET | Freq: Every day | ORAL | 11 refills | Status: DC

## 2023-06-08 MED ORDER — CLOPIDOGREL BISULFATE 75 MG PO TABS
75.0000 mg | ORAL_TABLET | Freq: Every day | ORAL | 3 refills | Status: AC
Start: 2023-06-08 — End: ?

## 2023-06-08 MED ORDER — ACETAMINOPHEN 325 MG PO TABS: 650.0000 mg | ORAL_TABLET | Freq: Four times a day (QID) | ORAL | Status: AC | PRN

## 2023-06-08 MED ORDER — ASPIRIN EC 81 MG PO TBEC: 81.0000 mg | DELAYED_RELEASE_TABLET | Freq: Every day | ORAL | 11 refills | Status: AC

## 2023-06-08 MED ORDER — ALBUTEROL SULFATE HFA 108 (90 BASE) MCG/ACT IN AERS: 2.0000 | INHALATION_SPRAY | Freq: Four times a day (QID) | RESPIRATORY_TRACT | 2 refills | Status: AC | PRN

## 2023-06-08 MED ORDER — SPIRONOLACTONE 25 MG PO TABS: 25.0000 mg | ORAL_TABLET | Freq: Every day | ORAL | 2 refills | Status: DC

## 2023-06-08 MED ORDER — POTASSIUM CHLORIDE CRYS ER 20 MEQ PO TBCR
40.0000 meq | EXTENDED_RELEASE_TABLET | Freq: Once | ORAL | Status: AC
  Administered 2023-06-08: 40 meq via ORAL
  Filled 2023-06-08: qty 2

## 2023-06-08 MED ORDER — AMLODIPINE BESYLATE 10 MG PO TABS: 10.0000 mg | ORAL_TABLET | Freq: Every day | ORAL | 5 refills | Status: DC

## 2023-06-08 MED ORDER — CARVEDILOL 25 MG PO TABS: 25.0000 mg | ORAL_TABLET | Freq: Two times a day (BID) | ORAL | 2 refills | Status: AC

## 2023-06-08 NOTE — Care Management Important Message (Signed)
 Important Message  Patient Details  Name: Leah Ferrell MRN: [id number] Date of Birth: 01-03-1951   Important Message Given:  N/A - LOS <3 / Initial given by admissions     [name] 06/08/2023, 12:10 PM

## 2023-06-08 NOTE — Progress Notes (Deleted)
 Cardiology Office Note   Date:  06/08/2023   ID:  Leah, Ferrell [birth year], MRN [id number]  PCP:  Yevette Hem, FNP  Cardiologist: Dr. Alvenia Aus   No chief complaint on file.    History of Present Illness: Leah Ferrell is a 73 y.o. female who presents for a followup visit regarding peripheral arterial disease and coronary artery disease.  She has known history of coronary artery disease status post drug-eluting stent placement to the LAD X 2 with subsequent CABG in May 2022. She is known to have peripheral arterial disease status post left common iliac artery stent placement followed by left common femoral artery resection and bypass from left external iliac to the left profunda done by Dr. Timm Foot in 2015. She is known to have chronically occluded bilateral SFAs being managed medically.  She had worsening left leg claudication in 2018 with elevated velocity at the proximal anastomosis of the graft.   Angiography in March 2018 showed mild to moderate calcified common iliac artery disease with patent stent in the left common iliac artery. There was severe stenosis in the left common femoral artery at the proximal anastomosis of the bypass to the profunda. I performed successful angioplasty and drug-coated balloon angioplasty.   She is status post bilateral renal artery stenting in January 2021 due to refractory hypertension and recurrent heart failure.  She had significant improvement in blood pressure control as well as heart failure symptoms since then.    Lower extremity angiography in March of 2022 showed patent renal artery stents with significant restenosis on the right side, severe bilateral heavily calcified ostial common iliac artery disease extending into the distal aorta.  The iliac arteries were treated by successful kissing stent placement.  On the right side, there was moderate common femoral artery stenosis as well as chronically occluded SFA with reconstitution  distally via collaterals from the profunda, short occlusion of above-the-knee popliteal artery with collaterals and three-vessel runoff below the knee.  On the left side, there was flush occlusion of the SFA with collaterals from the profunda and two-vessel runoff below the knee. She underwent CABG in May, 2022 with LIMA to LAD, SVG to OM and SVG to diagonal.  Most recent ABI was done in October 2024 and was moderately to severely reduced bilaterally at 0.49.  She was hospitalized last month with non-STEMI in the setting of severely elevated blood pressure.  Echocardiogram showed an EF of 50 to 55%.  Cardiac catheterization was done via the left radial artery and showed heavily calcified coronary arteries with severe underlying three-vessel coronary artery disease with patent grafts including LIMA to LAD, SVG to diagonal and SVG to OM 3.  There was severe progression of native coronary artery disease including the right coronary artery which was not bypassed.  I performed difficult but successful PCI and 3 overlapped drug-eluting stent placement to the right coronary artery.  Renal angiography showed widely patent left renal artery stent with no significant restenosis.  Right renal artery stent was patent but with severe in-stent restenosis proximally. She reports feeling significantly better and she only had 1 episode of chest pain since discharge.  She is mostly limited by severe bilateral leg claudication that happens after a short distance walking.  No rest pain or lower extremity ulceration.   Past Medical History:  Diagnosis Date   CAD (coronary artery disease)    a. 04/2012 NSTEMI: s/p DES to LAD.   Chronic diastolic CHF (congestive heart failure) (HCC)  Diabetes mellitus without complication (HCC)    Dysrhythmia    Environmental and seasonal allergies    Hyperlipidemia    Hypertension    Leukocytosis    Morbid obesity (HCC)    NSTEMI (non-ST elevated myocardial infarction) (HCC)  04/27/2012   PONV (postoperative nausea and vomiting)    PVD (peripheral vascular disease) (HCC)    a. 04/2012 ABI: R 0.49, L 0.39. Angiography 06/14: Significant ostial left common iliac artery stenosis extending into the distal aorta, severe left common femoral artery stenosis, bilateral SFA occlusion with heavy calcifications. Functionally, one-vessel runoff bilaterally below the knee   Renal artery stenosis (HCC)    s/p angioplasty/stenting bilateral renal arteries 03/16/19   Shortness of breath     Past Surgical History:  Procedure Laterality Date   ABDOMINAL AORTAGRAM N/A 07/21/2012   Procedure: ABDOMINAL Tommi Fraise;  Surgeon: Wenona Hamilton, MD;  Location: [facility] CATH [facility];  Service: Cardiovascular;  Laterality: N/A;   ABDOMINAL AORTOGRAM W/LOWER EXTREMITY N/A 05/09/2020   Procedure: ABDOMINAL AORTOGRAM W/LOWER EXTREMITY;  Surgeon: Wenona Hamilton, MD;  Location: [facility] INVASIVE CV LAB;  Service: Cardiovascular;  Laterality: N/A;   CARDIAC CATHETERIZATION     stents X 2   CATARACT EXTRACTION W/PHACO Left 09/08/2022   Procedure: CATARACT EXTRACTION PHACO AND INTRAOCULAR LENS PLACEMENT (IOC) with placement of Corticosteroid;  Surgeon: Tarri Farm, MD;  Location: [facility];  Service: Ophthalmology;  Laterality: Left;  CDE 8.48   CORONARY ANGIOGRAM  04/27/2012   Procedure: CORONARY ANGIOGRAM;  Surgeon: Lake Pilgrim, MD;  Location: [facility] CATH LAB;  Service: Cardiovascular;;   CORONARY ARTERY BYPASS GRAFT N/A 06/25/2020   Procedure: CORONARY ARTERY BYPASS GRAFTING (CABG)X3. LEFT INTERNAL MAMMARY ARTERY. RIGHT ENDOSCOPIC SAPHENOUS VEIN HARVESTING.;  Surgeon: Rudine Cos, MD;  Location: [facility] OR;  Service: Open Heart Surgery;  Laterality: N/A;   CORONARY STENT INTERVENTION N/A 04/08/2023   Procedure: CORONARY STENT INTERVENTION;  Surgeon: Wenona Hamilton, MD;  Location: [facility];  Service: Cardiovascular;  Laterality: N/A;  RCA   ENDARTERECTOMY FEMORAL Left 11/02/2013   Procedure: RESECTION  LEFT COMMON FEMORAL ARTERY AND INSERTION OF INTERPOSITION 7mm HEMASHIELD GRAFT FROM LEFT EXTERNAL  ILIAC ARTERY TO LEFT PROFUNDA  FEMORIS ARTERY;  Surgeon: Palma Bob, MD;  Location: [facility] OR;  Service: Vascular;  Laterality: Left;   EYE SURGERY Right 2017   FRACTURE SURGERY Right Jul 07, 2014   Right [place]-  [place]  [facility]  by Heat duct   ILIAC ARTERY STENT Left 08/31/2013   LAD stent     LEFT HEART CATH AND CORONARY ANGIOGRAPHY N/A 05/09/2020   Procedure: LEFT HEART CATH AND CORONARY ANGIOGRAPHY;  Surgeon: Wenona Hamilton, MD;  Location: [facility];  Service: Cardiovascular;  Laterality: N/A;   LEFT HEART CATH AND CORS/GRAFTS ANGIOGRAPHY N/A 04/08/2023   Procedure: LEFT HEART CATH AND CORS/GRAFTS ANGIOGRAPHY;  Surgeon: Wenona Hamilton, MD;  Location: [facility];  Service: Cardiovascular;  Laterality: N/A;   LEFT HEART CATHETERIZATION WITH CORONARY ANGIOGRAM N/A 04/23/2012   Procedure: LEFT HEART CATHETERIZATION WITH CORONARY ANGIOGRAM;  Surgeon: Peter M Swaziland, MD;  Location: [facility] CATH LAB;  Service: Cardiovascular;  Laterality: N/A;   LOWER EXTREMITY ANGIOGRAM Left 08/31/2013   Procedure: LOWER EXTREMITY ANGIOGRAM;  Surgeon: Wenona Hamilton, MD;  Location: [facility] CATH [facility];  Service: Cardiovascular;  Laterality: Left;   PERCUTANEOUS STENT INTERVENTION Left 08/31/2013   Procedure: PERCUTANEOUS STENT INTERVENTION;  Surgeon: Wenona Hamilton, MD;  Location: [facility] CATH  LAB;  Service: Cardiovascular;  Laterality: Left;  Common Iliac artery   PERIPHERAL VASCULAR CATHETERIZATION N/A 03/19/2016   Procedure: Abdominal Aortogram w/Lower Extremity;  Surgeon: Wenona Hamilton, MD;  Location: [facility] CV [facility];  Service: Cardiovascular;  Laterality: N/A;   PERIPHERAL VASCULAR CATHETERIZATION  03/19/2016   Procedure: Peripheral Vascular Balloon Angioplasty-CFA Left;  Surgeon: Wenona Hamilton, MD;  Location: [facility];  Service: Cardiovascular;;   PERIPHERAL VASCULAR INTERVENTION Bilateral  03/16/2019   Procedure: PERIPHERAL VASCULAR INTERVENTION;  Surgeon: Wenona Hamilton, MD;  Location: [facility];  Service: Cardiovascular;  Laterality: Bilateral;  RENAL   PERIPHERAL VASCULAR INTERVENTION Bilateral 05/09/2020   Procedure: PERIPHERAL VASCULAR INTERVENTION;  Surgeon: Wenona Hamilton, MD;  Location: [facility];  Service: Cardiovascular;  Laterality: Bilateral;  Iliac   RENAL ANGIOGRAPHY  03/16/2019   RENAL ANGIOGRAPHY Bilateral 03/16/2019   Procedure: RENAL ANGIOGRAPHY;  Surgeon: Wenona Hamilton, MD;  Location: [facility];  Service: Cardiovascular;  Laterality: Bilateral;   RENAL ANGIOGRAPHY Bilateral 04/08/2023   Procedure: RENAL ANGIOGRAPHY;  Surgeon: Wenona Hamilton, MD;  Location: [facility];  Service: Cardiovascular;  Laterality: Bilateral;   TEE WITHOUT CARDIOVERSION N/A 06/25/2020   Procedure: TRANSESOPHAGEAL ECHOCARDIOGRAM (TEE);  Surgeon: Rudine Cos, MD;  Location[facility] OR;  Service: Open Heart Surgery;  Laterality: N/A;   TUMOR REMOVAL     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     albuterol  (VENTOLIN  HFA) 108 (90 Base) MCG/ACT inhaler Inhale 2 puffs into the lungs every 6 (six) hours as needed for shortness of breath or wheezing. 8 g 2   amLODipine  (NORVASC ) 10 MG tablet Take 1 tablet (10 mg total) by mouth daily. 30 tablet 5   Ascorbic Acid  (VITAMIN C) 1000 MG tablet Take 1,000 mg by mouth 3 (three) times daily.      aspirin  EC 81 MG tablet Take 1 tablet (81 mg total) by mouth daily with breakfast. In the morning 30 tablet 11   BD ULTRA-FINE PEN NEEDLES 29G X 12.7MM MISC USE PENNEEDLES 3 TIMES  DAILY AS DIRECTED 270 each 1   blood glucose meter kit and supplies Dispense based on patient and insurance preference. Use up  to four times daily as directed. (FOR ICD-10 E10.9, E11.9). 1 each 0   Calcium  Carbonate-Vit D-Min (CALTRATE 600+D PLUS MINERALS) 600-800 MG-UNIT TABS Take 1 tablet by mouth 3 (three) times daily.      carvedilol  (COREG ) 25 MG tablet Take 1 tablet (25 mg total) by mouth 2 (two) times daily with a meal. 180 tablet 2   Chlorpheniramine-Acetaminophen  (CORICIDIN HBP COLD/FLU PO) Take 1 tablet by mouth daily as needed (cough/cold).     Cholecalciferol  (VITAMIN D -3) 125 MCG (5000 UT) TABS Take 5,000 Units by mouth daily.     clopidogrel  (PLAVIX ) 75 MG tablet Take 1 tablet (75 mg total) by mouth daily. 90 tablet 3   fish oil-omega-3 fatty acids  1000 MG capsule Take 1 g by mouth daily.      furosemide  (LASIX ) 40 MG tablet Take 1 tablet (40 mg total) by mouth daily. 30 tablet 11   glucose blood (GLUCOSE METER TEST) test strip Use 6-10 times a day, Uses ReliOn 900 each 12  hydroxypropyl methylcellulose (ISOPTO TEARS) 2.5 % ophthalmic solution Place 1 drop into both eyes 3 (three) times daily as needed (for dry eyes).     insulin  degludec (TRESIBA  FLEXTOUCH) 100 UNIT/ML FlexTouch Pen Inject 15-30 Units into the skin daily. (Patient taking differently: Inject 20 Units into the skin at bedtime.) 15 mL 0   insulin  lispro (HUMALOG  KWIKPEN) 200 UNIT/ML KwikPen Inject 50 Units into the skin 3 (three) times daily before meals. (Patient taking differently: Inject 20-30 Units into the skin 3 (three) times daily before meals.) 45 mL 11   Insulin  Syringe 27G X 1/2" 0.5 ML MISC Use with Fiasp  insulin  TID 100 each 3   lisinopril  (ZESTRIL ) 20 MG tablet Take 1 tablet by mouth twice daily 180 tablet 0   montelukast  (SINGULAIR ) 10 MG tablet Take 1 tablet by mouth once daily with breakfast 90 tablet 1   nitroGLYCERIN  (NITROSTAT ) 0.4 MG SL tablet Place 1 tablet (0.4 mg total) under the tongue every 5 (five) minutes as needed for chest pain. 10 tablet 3   oxybutynin  (DITROPAN -XL) 5 MG 24 hr tablet Take 1 tablet (5 mg total) by  mouth at bedtime. (Patient taking differently: Take 5 mg by mouth daily.) 90 tablet 1   pantoprazole  (PROTONIX ) 40 MG tablet Take 1 tablet (40 mg total) by mouth daily. 30 tablet 0   polyethylene glycol (MIRALAX  / GLYCOLAX ) 17 g packet Take 17 g by mouth daily.     RELION PEN NEEDLES 29G X MISC USE 1 NEEDLE TO INJECT INSULIN  SUBCUTANEOUSLY 3 TIMES DAILY (Patient taking differently: Inject 1 Device into the skin 3 (three) times daily.) 50 each 5   rosuvastatin  (CRESTOR ) 40 MG tablet Take 1 tablet by mouth once daily 90 tablet 0   Semaglutide , 2 MG/DOSE, (OZEMPIC , 2 MG/DOSE,) 8 MG/3ML SOPN Inject 2 mg into the skin once a week. (Patient taking differently: Inject 2 mg into the skin every Saturday.) 3 mL 0   spironolactone  (ALDACTONE ) 25 MG tablet Take 1 tablet (25 mg total) by mouth daily. 90 tablet 2   No current facility-administered medications for this visit.   Facility-Administered Medications Ordered in Other Visits  Medication Dose Route Frequency Provider Last Rate Last Admin   acetaminophen  (TYLENOL ) tablet 650 mg  650 mg Oral Q6H PRN Adefeso, Oladapo, DO       Or   acetaminophen  (TYLENOL ) suppository 650 mg  650 mg Rectal Q6H PRN Adefeso, Oladapo, DO       albuterol  (PROVENTIL ) (2.5 MG/3ML) 0.083% nebulizer solution 2.5 mg  2.5 mg Inhalation Q6H PRN Adefeso, Oladapo, DO       amLODipine  (NORVASC ) tablet 10 mg  10 mg Oral Daily Adefeso, Oladapo, DO   10 mg at 06/08/23 0840   carvedilol  (COREG ) tablet 3.125 mg  3.125 mg Oral BID WC Emokpae, Courage, MD   3.125 mg at 06/08/23 1610   clopidogrel  (PLAVIX ) tablet 75 mg  75 mg Oral Daily Adefeso, Oladapo, DO   75 mg at 06/08/23 9604   enoxaparin  (LOVENOX ) injection 50 mg  50 mg Subcutaneous Q24H Adefeso, Oladapo, DO   50 mg at 06/07/23 2220   furosemide  (LASIX ) injection 20 mg  20 mg Intravenous Q12H Emokpae, Courage, MD   20 mg at 06/08/23 0600   insulin  aspart (novoLOG ) injection 0-15 Units  0-15 Units Subcutaneous TID WC Adefeso,  Oladapo, DO   5 Units at 06/08/23 5409   insulin  aspart (novoLOG ) injection 0-5 Units  0-5 Units Subcutaneous QHS Adefeso, Oladapo, DO  2 Units at 06/06/23 2138   insulin  glargine-yfgn (SEMGLEE ) injection 20 Units  20 Units Subcutaneous Daily Colin Dawley, MD   20 Units at 06/07/23 1729   lisinopril  (ZESTRIL ) tablet 20 mg  20 mg Oral Daily Adefeso, Oladapo, DO   20 mg at 06/08/23 1610   ondansetron  (ZOFRAN ) tablet 4 mg  4 mg Oral Q6H PRN Adefeso, Oladapo, DO       Or   ondansetron  (ZOFRAN ) injection 4 mg  4 mg Intravenous Q6H PRN Adefeso, Oladapo, DO       pantoprazole  (PROTONIX ) EC tablet 40 mg  40 mg Oral Daily Adefeso, Oladapo, DO   40 mg at 06/08/23 9604   rosuvastatin  (CRESTOR ) tablet 40 mg  40 mg Oral Daily Adefeso, Oladapo, DO   40 mg at 06/08/23 5409   spironolactone  (ALDACTONE ) tablet 25 mg  25 mg Oral Daily Adefeso, Oladapo, DO   25 mg at 06/08/23 8119    Allergies:   Tape    Social History:  The patient  reports that she quit smoking about 22 years ago. Her smoking use included cigarettes. She started smoking about 59 years ago. She has a 36.8 pack-year smoking history. She has never used smokeless tobacco. She reports that she does not drink alcohol  and does not use drugs.   Family History:  The patient's family history includes Alcohol  abuse in her brother; Diabetes in her brother, daughter, maternal grandmother, mother, and son; Heart attack in her maternal grandfather; Heart disease in her brother; Hyperlipidemia in her mother and son; Hypertension in her daughter, mother, and son; Other in her sister and sister; Peripheral vascular disease in her mother.    ROS:  Please see the history of present illness.   Otherwise, review of systems are positive for none.   All other systems are reviewed and negative.    PHYSICAL EXAM: VS:  There were no vitals taken for this visit. , BMI There is no height or weight on file to calculate BMI. GEN: Well nourished, well developed, in no  acute distress  HEENT: normal  Neck: no JVD or masses.  Right carotid bruit. Cardiac: RRR; no  rubs, or gallops , trace edema . There is 2/6 systolic ejection murmur in the aortic area.  Respiratory:  clear to auscultation bilaterally, normal work of breathing GI: soft, nontender, nondistended, + BS MS: no deformity or atrophy  Skin: warm and dry, no rash Neuro:  Strength and sensation are intact Psych: euthymic mood, full affect Distal pulses are not palpable.   Left radial pulse is normal with no hematoma.  EKG:  EKG is ordered today. EKG showed: Sinus bradycardia Inferior infarct , age undetermined ST & T wave abnormality, consider lateral ischemia When compared with ECG of 09-Apr-2023 05:09, Inferior infarct is now Present T wave inversion now evident in Inferior leads T wave inversion less evident in Lateral leads QT has shortened    Recent Labs: 12/15/2022: TSH 1.700 06/05/2023: B Natriuretic Peptide 285.0 06/06/2023: ALT 33; Hemoglobin 11.6; Magnesium  2.0; Platelets 203 06/08/2023: BUN 23; Creatinine, Ser 0.86; Potassium 3.6; Sodium 135    Lipid Panel    Component Value Date/Time   CHOL 128 04/08/2023 0226   CHOL 174 12/15/2022 1144   TRIG 134 04/08/2023 0226   TRIG 135 09/16/2012 1322   HDL 52 04/08/2023 0226   HDL 73 12/15/2022 1144   HDL 60 09/16/2012 1322   CHOLHDL 2.5 04/08/2023 0226   VLDL 27 04/08/2023 0226   LDLCALC 49 04/08/2023  0226   LDLCALC 80 12/15/2022 1144   LDLCALC 92 09/16/2012 1322      Wt Readings from Last 3 Encounters:  06/08/23 199 lb 14.4 oz (90.7 kg)  05/28/23 205 lb (93 kg)  05/25/23 206 lb (93.4 kg)           No data to display            ASSESSMENT AND PLAN:  1. Peripheral arterial disease: Status post  kissing stent placement to the common iliac arteries .  She has moderately reduced ABI at 0.49 bilaterally with severe Rutherford class III claudication which significantly affects her ability to exercise.  She is  known to have heavily calcified vessels.  I am going to refer her to a structured exercise program.  If no improvement in symptoms, angiography and revascularization can be considered. The patient was educated on risk factors surrounding PAD. I discussed the importance of controlling risk factors and importance of regular exercise to help reduce pain. I discussed the effects of exercise on reducing claudications and improving his risk factors including hyperlipidemia and hypertension.    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  Recent angiography confirmed patent left renal artery stent with no significant restenosis but there was significant right renal artery in-stent restenosis.  Her renal function is normal.  As long as her blood pressure is controlled, no plans for revascularization of in-stent restenosis.  Will continue to monitor for now.  3. Coronary artery disease involving native coronary arteries with stable angina: Status post recent successful PCI and drug-eluting stent placement to the native right coronary artery which was not bypassed before.  Her vessels are known to be heavily calcified.  She benefits from cardiac rehab but she reports being more limited by claudication and it might be helpful if we tailor the rehab to her PAD.  4. Hyperlipidemia: Continue high-dose rosuvastatin .  Recent lipid profile showed improvement in LDL which was down to 49.  Her LP(a) was severely elevated at 241 and we might have to address that at some point in the future.    Disposition:   FU with me in 3  month  Signed,  Antionette Kirks, MD  06/08/2023 5:10 PM    [facility]

## 2023-06-08 NOTE — Discharge Instructions (Signed)
 1)Very Low-salt diet advised---Less than 2 gm of Sodium per day advised----ok to use Mrs DASH salt substitute instead of Salt 2)Weigh yourself daily, call if you gain more than 3 pounds in 1 day or more than 5 pounds in 1 week as your diuretic medications may need to be adjusted 3)Please Note that there has been changes to your medications- 4)follow up with Yevette Hem, FNP (Primary Care Provider)--- in about 1 week for recheck and reevaluation and repeat BMP blood test 5)Please talk to your cardiologist Dr. Alvenia Aus about resuming cardiac rehab

## 2023-06-08 NOTE — Discharge Summary (Addendum)
 Leah Ferrell, is a 73 y.o. female  DOB [birth year]  MRN [id number].  Admission date:  06/05/2023  Admitting Physician  Twilla Galea, DO  Discharge Date:  06/08/2023   Primary MD  Yevette Hem, FNP  Recommendations for primary care physician for things to follow:  1)Very Low-salt diet advised---Less than 2 gm of Sodium per day advised----ok to use Mrs DASH salt substitute instead of Salt 2)Weigh yourself daily, call if you gain more than 3 pounds in 1 day or more than 5 pounds in 1 week as your diuretic medications may need to be adjusted 3)Please Note that there has been changes to your medications- 4)follow up with Yevette Hem, FNP (Primary Care Provider)--- in about 1 week for recheck and reevaluation and repeat BMP blood test 5)Please talk to your cardiologist Dr. Alvenia Aus about resuming cardiac rehab  Admission Diagnosis  Exertional dyspnea [R06.09] Hypoxia [R09.02] Acute on chronic diastolic CHF (congestive heart failure) (HCC) [I50.33] Acute congestive heart failure, unspecified heart failure type (HCC) [I50.9]   Discharge Diagnosis  Exertional dyspnea [R06.09] Hypoxia [R09.02] Acute on chronic diastolic CHF (congestive heart failure) (HCC) [I50.33] Acute congestive heart failure, unspecified heart failure type (HCC) [I50.9]    Principal Problem:   Acute on chronic diastolic CHF (congestive heart failure) (HCC) Active Problems:   Elevated troponin   Essential hypertension   Mixed hyperlipidemia   CAD (coronary artery disease)   PAD (peripheral artery disease) (HCC)   Acute respiratory failure with hypoxia (HCC)   Type 2 diabetes mellitus with hyperglycemia (HCC)   GERD (gastroesophageal reflux disease)      Past Medical History:  Diagnosis Date   CAD (coronary artery disease)    a. 04/2012 NSTEMI: s/p DES to LAD.   Chronic diastolic CHF (congestive heart failure) (HCC)     Diabetes mellitus without complication (HCC)    Dysrhythmia    Environmental and seasonal allergies    Hyperlipidemia    Hypertension    Leukocytosis    Morbid obesity (HCC)    NSTEMI (non-ST elevated myocardial infarction) (HCC) 04/27/2012   PONV (postoperative nausea and vomiting)    PVD (peripheral vascular disease) (HCC)    a. 04/2012 ABI: R 0.49, L 0.39. Angiography 06/14: Significant ostial left common iliac artery stenosis extending into the distal aorta, severe left common femoral artery stenosis, bilateral SFA occlusion with heavy calcifications. Functionally, one-vessel runoff bilaterally below the knee   Renal artery stenosis (HCC)    s/p angioplasty/stenting bilateral renal arteries 03/16/19   Shortness of breath     Past Surgical History:  Procedure Laterality Date   ABDOMINAL AORTAGRAM N/A 07/21/2012   Procedure: ABDOMINAL Tommi Fraise;  Surgeon: Wenona Hamilton, MD;  Location: [facility] CATH [facility];  Service: Cardiovascular;  Laterality: N/A;   ABDOMINAL AORTOGRAM W/LOWER EXTREMITY N/A 05/09/2020   Procedure: ABDOMINAL AORTOGRAM W/LOWER EXTREMITY;  Surgeon: Wenona Hamilton, MD;  Location: [facility] INVASIVE CV LAB;  Service: Cardiovascular;  Laterality: N/A;   CARDIAC CATHETERIZATION     stents X 2   CATARACT  EXTRACTION W/PHACO Left 09/08/2022   Procedure: CATARACT EXTRACTION PHACO AND INTRAOCULAR LENS PLACEMENT (IOC) with placement of Corticosteroid;  Surgeon: Tarri Farm, MD;  Location: [facility] ORS;  Service: Ophthalmology;  Laterality: Left;  CDE 8.48   CORONARY ANGIOGRAM  04/27/2012   Procedure: CORONARY ANGIOGRAM;  Surgeon: [place], MD;  Location[facility] CATH LAB;  Service: Cardiovascular;;   CORONARY ARTERY BYPASS GRAFT N/A 06/25/2020   Procedure: CORONARY ARTERY BYPASS GRAFTING (CABG)X3. LEFT INTERNAL MAMMARY ARTERY. RIGHT ENDOSCOPIC SAPHENOUS VEIN HARVESTING.;  Surgeon: Rudine Cos, MD;  Location: [facility] OR;  Service: Open Heart Surgery;  Laterality: N/A;   CORONARY STENT INTERVENTION  N/A 04/08/2023   Procedure: CORONARY STENT INTERVENTION;  Surgeon: Wenona Hamilton, MD;  [facility]: [facility];  Service: Cardiovascular;  Laterality: N/A;  RCA   ENDARTERECTOMY FEMORAL Left 11/02/2013   Procedure: RESECTION LEFT COMMON FEMORAL ARTERY AND INSERTION OF INTERPOSITION 7mm HEMASHIELD GRAFT FROM LEFT EXTERNAL  ILIAC ARTERY TO LEFT PROFUNDA  FEMORIS ARTERY;  Surgeon: Palma Bob, MD;  Location: [facility] OR;  Service: Vascular;  Laterality: Left;   EYE SURGERY Right 2017   FRACTURE SURGERY Right Jul 07, 2014   Right Foot-  [place]  [place] [facility]  by Heat duct   ILIAC ARTERY STENT Left 08/31/2013   LAD stent     LEFT HEART CATH AND CORONARY ANGIOGRAPHY N/A 05/09/2020   Procedure: LEFT HEART CATH AND CORONARY ANGIOGRAPHY;  Surgeon: Wenona Hamilton, MD;  [facility];  Service: Cardiovascular;  Laterality: N/A;   LEFT HEART CATH AND CORS/GRAFTS ANGIOGRAPHY N/A 04/08/2023   Procedure: LEFT HEART CATH AND CORS/GRAFTS ANGIOGRAPHY;  Surgeon: Wenona Hamilton, MD;  [facility];  Service: Cardiovascular;  Laterality: N/A;   LEFT HEART CATHETERIZATION WITH CORONARY ANGIOGRAM N/A 04/23/2012   Procedure: LEFT HEART CATHETERIZATION WITH CORONARY ANGIOGRAM;  Surgeon: Peter M Swaziland, MD;  Location: [facility] CATH LAB;  Service: Cardiovascular;  Laterality: N/A;   LOWER EXTREMITY ANGIOGRAM Left 08/31/2013   Procedure: LOWER EXTREMITY ANGIOGRAM;  Surgeon: Wenona Hamilton, MD;  [facility]: [facility]ATH [facility];  Service: Cardiovascular;  Laterality: Left;   PERCUTANEOUS STENT INTERVENTION Left 08/31/2013   Procedure: PERCUTANEOUS STENT INTERVENTION;  Surgeon: Wenona Hamilton, MD;  [facility]: [facility] CATH [facility];  Service: Cardiovascular;  Laterality: Left;  Common Iliac artery   PERIPHERAL VASCULAR CATHETERIZATION N/A 03/19/2016   Procedure: Abdominal Aortogram w/Lower Extremity;  Surgeon: Wenona Hamilton, MD;  [facility];  Service: Cardiovascular;  Laterality: N/A;   PERIPHERAL  VASCULAR CATHETERIZATION  03/19/2016   Procedure: Peripheral Vascular Balloon Angioplasty-CFA Left;  Surgeon: Wenona Hamilton, MD;  [facility];  Service: Cardiovascular;;   PERIPHERAL VASCULAR INTERVENTION Bilateral 03/16/2019   Procedure: PERIPHERAL VASCULAR INTERVENTION;  Surgeon: Wenona Hamilton, MD;  [facility];  Service: Cardiovascular;  Laterality: Bilateral;  RENAL   PERIPHERAL VASCULAR INTERVENTION Bilateral 05/09/2020   Procedure: PERIPHERAL VASCULAR INTERVENTION;  Surgeon: Wenona Hamilton, MD;  [facility];  Service: Cardiovascular;  Laterality: Bilateral;  Iliac   RENAL ANGIOGRAPHY  03/16/2019   RENAL ANGIOGRAPHY Bilateral 03/16/2019   Procedure: RENAL ANGIOGRAPHY;  Surgeon: Wenona Hamilton, MD;  [facility];  Service: Cardiovascular;  Laterality: Bilateral;   RENAL ANGIOGRAPHY Bilateral 04/08/2023   Procedure: RENAL ANGIOGRAPHY;  Surgeon: Wenona Hamilton, MD;  [facility];  Service: Cardiovascular;  Laterality: Bilateral;   TEE WITHOUT CARDIOVERSION  N/A 06/25/2020   Procedure: TRANSESOPHAGEAL ECHOCARDIOGRAM (TEE);  Surgeon: Rudine Cos, MD;  Location[facility] OR;  Service: Open Heart Surgery;  Laterality: N/A;   TUMOR REMOVAL     tumor removed from [place]     HPI  from the history and physical done on the day of admission:   HPI: MARILOU BARNFIELD is a 73 y.o. female with medical history significant of CAD s/p DES, chronic diastolic CHF, hypertension, hyperlipidemia, GERD, T2DM who presents to the emergency department due to several weeks of progressively worsening shortness of breath at night and on exertion which rapidly worsened within the last 24 hours she complained of postnasal drip and sinus pressure congestion with an associated nonproductive cough.  She complained of being compliant with home medication and she denies chest pain, fever, chills, increased leg swelling, nausea or vomiting.    ED Course:  In the emergency department, BP was 146/48, pulse 44 bpm, other vital signs are within normal range.  Patient's O2 sats dropped to 80% on room air on ambulation, this improved to 91% on supplemental oxygen via [place] at 3 LPM.  Workup in the ED showed normocytic anemia and normal BMP except for sodium of 131 and blood glucose of 152.  BNP 285, troponin x 1 was 19. Chest x-ray showed cardiomegaly  with vascular congestion and diffuse interstitial opacity favored to be secondary to edema. Possible tiny pleural effusions. IV Lasix  40 mg x 1 was given.  DuoNeb was given. Hospitalist was asked to admit patient for further evaluation and management.   Review of Systems: Review of systems as noted in the HPI. All other systems reviewed and are negative   Hospital Course:     Brief Narrative:  73 y.o. female with medical history significant of CAD s/p DES, chronic diastolic CHF, HTN and HLD, GERD, T2DM admitted on 06/05/2022 with acute diastolic CHF exacerbation     -Assessment and Plan: 1) acute diastolic CHF exacerbation--clinical findings, chest x-ray finding, BNP all support CHF exacerbation -Echo on 06/06/2023 with EF of 60-65 %, no regional wall motion abnormalities, no mitral stenosis,  prior echo from February 2025 with grade 1 diastolic dysfunction. - Patient has ruled out for ACS by cardiac enzymes and EKG -Continue Aldactone  and Lisinopril  - No further dyspnea at rest, voiding well Wt 201 >> 199 lb - Much improved with IV Lasix  -Post ambulation O2 sats 90 to 92% on room air for the most part today --Discharge on oral Lasix  and Aldactone  -Follow-up with Dr. Alvenia Aus , okay to resume cardiac rehab   2)CAD--- status post CABG, status post prior PCI with stenting -Chest pain-free--rule out for ACS as above #1 - Continue Plavix  , Coreg  and Crestor  --Follow-up with Dr. Alvenia Aus    3) acute hypoxic respiratory failure--due to #1 above -- Much improved with IV Lasix  -Post ambulation  O2 sats 90 to 92% on room air for the most part today - Acute hypoxic respiratory failure resolved after treatment as above #1   4)DM2----A1c 7.4 reflecting uncontrolled DM with hyperglycemia prior to admission - Resume Ozempic  and long-acting insulin  -  5)HTN--continue lisinopril , amlodipine , coreg , Lasix  and Aldactone    6)GERD--continue Protonix     7)Obesity class II  -Low calorie diet, portion control and increase physical activity discussed with patient -Body mass index is 36.56 kg/m.  Disposition: The patient is from: Home              Anticipated d/c is to: Home  Discharge Condition: stable  Follow UP   Follow-up Information     Yevette Hem, FNP. Schedule an appointment as soon as possible for a visit in 1 week(s).   Specialty: Family Medicine Why: repeat BMP in 1 week Contact information: [address] [place] [address] [phone]         Wenona Hamilton, MD. Schedule an appointment as soon as possible for a visit.   Specialty: Cardiology Contact information: [address] [place] [address] [phone]                 Diet and Activity recommendation:  As advised  Discharge Instructions    Discharge Instructions     AMB Referral to Phase II Cardiac Rehab   Complete by: As directed    Diagnosis: Heart Failure (see criteria below if ordering Phase II)   Heart Failure Type: Chronic Diastolic   After initial evaluation and assessments completed: Virtual Based Care may be provided alone or in conjunction with Phase 2 Cardiac Rehab based on patient barriers.: Yes   Intensive Cardiac Rehabilitation (ICR) [facility] location only OR Traditional Cardiac Rehabilitation (TCR) *If criteria for ICR are not met will enroll in TCR [facility] only): Yes   Call MD for:  difficulty breathing, headache or visual disturbances   Complete by: As directed    Call MD for:  persistant dizziness or light-headedness   Complete by: As directed     Call MD for:  persistant nausea and vomiting   Complete by: As directed    Call MD for:  temperature >100.4   Complete by: As directed    Diet - low sodium heart healthy   Complete by: As directed    Discharge instructions   Complete by: As directed    1)Very Low-salt diet advised---Less than 2 gm of Sodium per day advised----ok to use Mrs DASH salt substitute instead of Salt 2)Weigh yourself daily, call if you gain more than 3 pounds in 1 day or more than 5 pounds in 1 week as your diuretic medications may need to be adjusted 3)Please Note that there has been changes to your medications- 4)follow up with Yevette Hem, FNP (Primary Care Provider)--- in about 1 week for recheck and reevaluation and repeat BMP blood test 5)Please talk to your cardiologist Dr. Alvenia Aus about resuming cardiac rehab   Increase activity slowly   Complete by: As directed          Discharge Medications     Allergies as of 06/08/2023       Reactions   Tape Rash        Medication List     TAKE these medications    acetaminophen  325 MG tablet Commonly known as: TYLENOL  Take 2 tablets (650 mg total) by mouth every 6 (six) hours as needed for mild pain (pain score 1-3) (or Fever >/= 101).   albuterol  108 (90 Base) MCG/ACT inhaler Commonly known as: VENTOLIN  HFA Inhale 2 puffs into the lungs every 6 (six) hours as needed for shortness of breath or wheezing.   amLODipine  10 MG tablet Commonly known as: NORVASC  Take 1 tablet (10 mg total) by mouth daily.   aspirin  EC 81 MG tablet Take 1 tablet (81 mg total) by mouth daily with breakfast. In the morning What changed: when to take this   blood glucose meter kit and supplies Dispense based on patient and insurance preference. Use up to four times daily as directed. (FOR ICD-10 E10.9, E11.9).  Caltrate 600+D Plus Minerals 600-800 MG-UNIT Tabs Take 1 tablet by mouth 3 (three) times daily.   carvedilol  25 MG tablet Commonly known as: COREG  Take  1 tablet (25 mg total) by mouth 2 (two) times daily with a meal.   clopidogrel  75 MG tablet Commonly known as: PLAVIX  Take 1 tablet (75 mg total) by mouth daily.   CORICIDIN HBP COLD/FLU PO Take 1 tablet by mouth daily as needed (cough/cold).   fish oil-omega-3 fatty acids  1000 MG capsule Take 1 g by mouth daily.   furosemide  40 MG tablet Commonly known as: Lasix  Take 1 tablet (40 mg total) by mouth daily.   Glucose Meter Test test strip Generic drug: glucose blood Use 6-10 times a day, Uses ReliOn   HumaLOG  KwikPen 200 UNIT/ML KwikPen Generic drug: insulin  lispro Inject 50 Units into the skin 3 (three) times daily before meals. What changed: how much to take   hydroxypropyl methylcellulose / hypromellose 2.5 % ophthalmic solution Commonly known as: ISOPTO TEARS / GONIOVISC Place 1 drop into both eyes 3 (three) times daily as needed (for dry eyes).   Insulin  Syringe 27G X 1/2" 0.5 ML Misc Use with Fiasp  insulin  TID   lisinopril  20 MG tablet Commonly known as: ZESTRIL  Take 1 tablet by mouth twice daily   montelukast  10 MG tablet Commonly known as: SINGULAIR  Take 1 tablet by mouth once daily with breakfast   nitroGLYCERIN  0.4 MG SL tablet Commonly known as: Nitrostat  Place 1 tablet (0.4 mg total) under the tongue every 5 (five) minutes as needed for chest pain.   oxybutynin  5 MG 24 hr tablet Commonly known as: DITROPAN -XL Take 1 tablet (5 mg total) by mouth at bedtime. What changed: when to take this   Ozempic  (2 MG/DOSE) 8 MG/3ML Sopn Generic drug: Semaglutide  (2 MG/DOSE) Inject 2 mg into the skin once a week. What changed: when to take this   pantoprazole  40 MG tablet Commonly known as: PROTONIX  Take 1 tablet (40 mg total) by mouth daily.   polyethylene glycol 17 g packet Commonly known as: MIRALAX  / GLYCOLAX  Take 17 g by mouth daily.   ReliOn Pen Needles 29G X Misc Generic drug: Insulin  Pen Needle USE 1 NEEDLE TO INJECT INSULIN  SUBCUTANEOUSLY 3  TIMES DAILY   BD ULTRA-FINE PEN NEEDLES 29G X 12.7MM Misc Generic drug: Insulin  Pen Needle USE PENNEEDLES 3 TIMES  DAILY AS DIRECTED   rosuvastatin  40 MG tablet Commonly known as: CRESTOR  Take 1 tablet by mouth once daily   spironolactone  25 MG tablet Commonly known as: ALDACTONE  Take 1 tablet (25 mg total) by mouth daily.   Tresiba  FlexTouch 100 UNIT/ML FlexTouch Pen Generic drug: insulin  degludec Inject 15-30 Units into the skin daily. What changed:  how much to take when to take this   vitamin C 1000 MG tablet Take 1,000 mg by mouth 3 (three) times daily.   Vitamin D -3 125 MCG (5000 UT) Tabs Take 5,000 Units by mouth daily.        Major procedures and Radiology Reports - PLEASE review detailed and final reports for all details, in brief -   ECHOCARDIOGRAM LIMITED Result Date: 06/06/2023    ECHOCARDIOGRAM LIMITED REPORT   Patient Name:   JERYN BERTONI Date of Exam: 06/06/2023 Medical Rec #:  [id number]         Height:       62.0 in Accession #:    [id number]        Weight:  201.1 lb Date of Birth:  1950/03/14          BSA:          1.916 m Patient Age:    73 years          BP:           142/45 mmHg Patient Gender: F                 HR:           50 bpm. Exam Location:  [facility] Procedure: Limited Echo, Cardiac Doppler and Color Doppler (Both Spectral and            Color Flow Doppler were utilized during procedure). Indications:    CHF Acute Diastolic  History:        Patient has prior history of Echocardiogram examinations, most                 recent 04/08/2023.  Sonographer:    Reta Cassis Referring Phys: [id number] OLADAPO ADEFESO IMPRESSIONS  1. Left ventricular ejection fraction, by estimation, is 60 to 65%. The left ventricle has normal function. The left ventricle has no regional wall motion abnormalities. Left ventricular diastolic function could not be evaluated.  2. Right ventricular systolic function is normal. The right ventricular size is normal. Tricuspid  regurgitation signal is inadequate for assessing PA pressure.  3. Left atrial size was moderately dilated.  4. The mitral valve is degenerative. Trivial mitral valve regurgitation. No evidence of mitral stenosis. Moderate mitral annular calcification.  5. The aortic valve was not well visualized.  6. The inferior vena cava is normal in size with greater than 50% respiratory variability, suggesting right atrial pressure of 3 mmHg. Comparison(s): No significant change from prior study. FINDINGS  Left Ventricle: Left ventricular ejection fraction, by estimation, is 60 to 65%. The left ventricle has normal function. The left ventricle has no regional wall motion abnormalities. The left ventricular internal cavity size was normal in size. There is  no left ventricular hypertrophy. Left ventricular diastolic function could not be evaluated. Right Ventricle: The right ventricular size is normal. No increase in right ventricular wall thickness. Right ventricular systolic function is normal. Tricuspid regurgitation signal is inadequate for assessing PA pressure. Left Atrium: Left atrial size was moderately dilated. Pericardium: There is no evidence of pericardial effusion. Mitral Valve: The mitral valve is degenerative in appearance. Moderate mitral annular calcification. Trivial mitral valve regurgitation. No evidence of mitral valve stenosis. MV peak gradient, 10.8 mmHg. The mean mitral valve gradient is 3.0 mmHg. Tricuspid Valve: The tricuspid valve is grossly normal. Tricuspid valve regurgitation is trivial. No evidence of tricuspid stenosis. Aortic Valve: The aortic valve was not well visualized. Pulmonic Valve: The pulmonic valve was grossly normal. Pulmonic valve regurgitation is not visualized. No evidence of pulmonic stenosis. Venous: The inferior vena cava is normal in size with greater than 50% respiratory variability, suggesting right atrial pressure of 3 mmHg. Additional Comments: Spectral Doppler performed.  Color Doppler performed.  LEFT VENTRICLE PLAX 2D LVIDd:         4.70 cm LVIDs:         3.40 cm LV PW:         1.00 cm LV IVS:        1.00 cm LVOT diam:     2.00 cm LVOT Area:     3.14 cm  IVC IVC diam: 1.70 cm LEFT ATRIUM  Index LA diam:      4.30 cm 2.24 cm/m LA Vol (A4C): 91.2 ml 47.60 ml/m   AORTA Ao Root diam: 2.00 cm MITRAL VALVE MV Peak grad: 10.8 mmHg SHUNTS MV Mean grad: 3.0 mmHg  Systemic Diam: 2.00 cm MV Vmax:      1.64 m/s MV Vmean:     67.8 cm/s Jackquelyn Mass MD Electronically signed by Jackquelyn Mass MD Signature Date/Time: 06/06/2023/12:49:12 PM    Final    DG Chest 2 View Result Date: 06/05/2023 CLINICAL DATA:  Dyspnea EXAM: CHEST - 2 VIEW COMPARISON:  04/06/2023 FINDINGS: Post sternotomy changes. Cardiomegaly with vascular congestion and diffuse interstitial opacity favored to be secondary to edema. Possible tiny pleural effusions. Aortic atherosclerosis. No pneumothorax IMPRESSION: Cardiomegaly with vascular congestion and diffuse interstitial opacity favored to be secondary to edema. Possible tiny pleural effusions. Electronically Signed   By: Esmeralda Hedge M.D.   On: 06/05/2023 16:42   CT Head Wo Contrast Result Date: 05/28/2023 CLINICAL DATA:  Syncope/presyncope, cerebrovascular cause suspected Elevated BP with dizziness EXAM: CT HEAD WITHOUT CONTRAST TECHNIQUE: Contiguous axial images were obtained from the base of the skull through the vertex without intravenous contrast. RADIATION DOSE REDUCTION: This exam was performed according to the departmental dose-optimization program which includes automated exposure control, adjustment of the mA and/or kV according to patient size and/or use of iterative reconstruction technique. COMPARISON:  CT head 04/06/2023 FINDINGS: Brain: Patchy and confluent areas of decreased attenuation are noted throughout the deep and periventricular white matter of the cerebral hemispheres bilaterally, compatible with chronic microvascular ischemic disease.  No evidence of large-territorial acute infarction. No parenchymal hemorrhage. No mass lesion. No extra-axial collection. No mass effect or midline shift. No hydrocephalus. Basilar cisterns are patent. Vascular: No hyperdense vessel. Atherosclerotic calcifications are present within the cavernous internal carotid and vertebral arteries. Skull: No acute fracture or focal lesion. Sinuses/Orbits: Paranasal sinuses and mastoid air cells are clear. The orbits are unremarkable. Other: None. IMPRESSION: No acute intracranial abnormality. Electronically Signed   By: Morgane  Naveau M.D.   On: 05/28/2023 21:26   Today   Subjective    Marilene Mcdermott today has no new complaints - No chest pains no palpitations no dyspnea on exertion -Voiding Well - Much improved with IV Lasix  -Post ambulation O2 sats 90 to 92% on room air for the most part today          Patient has been seen and examined prior to discharge   Objective   Blood pressure (!) 155/58, pulse (!) 58, temperature 97.6 F (36.4 C), temperature source Oral, resp. rate 16, height 5\' 2"  (1.575 m), weight 90.7 kg, SpO2 95%.   Intake/Output Summary (Last 24 hours) at 06/08/2023 1020 Last data filed at 06/08/2023 0500 Gross per 24 hour  Intake 840 ml  Output 1300 ml  Net -460 ml   Exam Gen:- Awake Alert, no acute distress , no conversational dyspnea HEENT:- [place].AT, No sclera icterus Neck-Supple Neck,No JVD,.  Lungs-improved air movement, no rales  CV- S1, S2 normal, regular Abd-  +ve B.Sounds, Abd Soft, No tenderness,    Extremity/Skin:-Resolved edema,   good pulses Psych-affect is appropriate, oriented x3 Neuro-no new focal deficits, no tremors    Data Review   CBC w Diff:  Lab Results  Component Value Date   WBC 8.3 06/06/2023   HGB 11.6 (L) 06/06/2023   HGB 14.9 12/15/2022   HCT 35.0 (L) 06/06/2023   HCT 46.6 12/15/2022   PLT 203 06/06/2023  PLT 295 12/15/2022   LYMPHOPCT 24 06/05/2023   MONOPCT 6 06/05/2023   EOSPCT 1  06/05/2023   BASOPCT 0 06/05/2023    CMP:  Lab Results  Component Value Date   NA 135 06/08/2023   NA 138 05/11/2023   K 3.6 06/08/2023   CL 94 (L) 06/08/2023   CO2 30 06/08/2023   BUN 23 06/08/2023   BUN 15 05/11/2023   CREATININE 0.86 06/08/2023   CREATININE 0.78 03/11/2016   PROT 7.0 06/06/2023   PROT 7.2 05/11/2023   ALBUMIN  3.3 (L) 06/06/2023   ALBUMIN  4.1 05/11/2023   BILITOT 0.5 06/06/2023   BILITOT 0.2 05/11/2023   ALKPHOS 49 06/06/2023   AST 21 06/06/2023   ALT 33 06/06/2023  .  Total Discharge time is about 33 minutes  Colin Dawley M.D on 06/08/2023 at 10:20 AM  Go to [email or web address] -  for contact info  [facility] - Office  [phone]

## 2023-06-08 NOTE — Progress Notes (Signed)
 Nurse at bedside,patient alert and oriented times four.No co pain or discomfort noted.Patient sitting on side of bed eating breakfast.Plan of care on going.

## 2023-06-08 NOTE — Telephone Encounter (Signed)
 WENT TO ED

## 2023-06-08 NOTE — Progress Notes (Signed)
 Patient discharged home with instructions,given on medications and follow up visits,verbalized understanding .Prescriptions sent to Pharmacy documented on AVS. IV discontinued,catheter intact.Accompanied by staff to an awaiting vehicle.

## 2023-06-08 NOTE — Inpatient Diabetes Management (Signed)
 Inpatient Diabetes Program Recommendations  AACE/ADA: New Consensus Statement on Inpatient Glycemic Control (2015)  Target Ranges:  Prepandial:   less than 140 mg/dL      Peak postprandial:   less than 180 mg/dL (1-2 hours)      Critically ill patients:  140 - 180 mg/dL   Lab Results  Component Value Date   GLUCAP 203 (H) 06/08/2023   HGBA1C 7.4 (H) 06/06/2023      Latest Reference Range & Units 06/07/23 07:13 06/07/23 11:16 06/07/23 16:07 06/07/23 21:28 06/08/23 07:26  Glucose-Capillary 70 - 99 mg/dL 657 (H) 846 (H) 962 (H) 172 (H) 203 (H)  (H): Data is abnormally high  Diabetes history: DM2 Outpatient Diabetes medications: Tresiba  20 units daily, Humalog  20-30 units tid meal coverage, ozempic  2 mg weekly Current orders for Inpatient glycemic control: Semglee  20 units daily, Novolog  0-15 units tid, 0-5 units hs  Inpatient Diabetes Program Recommendations:   Please consider: -Add Novolog  8 units tid meal coverage if eats 50% meal  Thank you, Marily Shows E. Maor Meckel, RN, MSN, CDCES  Diabetes Coordinator Inpatient Glycemic Control Team Team Pager [phone] (8am-5pm) 06/08/2023 7:49 AM

## 2023-06-09 ENCOUNTER — Telehealth: Payer: Self-pay | Admitting: *Deleted

## 2023-06-09 ENCOUNTER — Ambulatory Visit: Attending: Cardiovascular Disease | Admitting: Cardiovascular Disease

## 2023-06-09 NOTE — Transitions of Care (Post Inpatient/ED Visit) (Signed)
   06/09/2023  Name: Leah Ferrell MRN: [id number] DOB: Jul 29, 1950  Today's TOC FU Call Status: Today's TOC FU Call Status:: Unsuccessful Call (1st Attempt) Unsuccessful Call (1st Attempt) Date: 06/09/23  Attempted to reach the patient regarding the most recent Inpatient/ED visit. Spoke with patient who reports she is driving her car, requests call back tomorrow.  Follow Up Plan: Additional outreach attempts will be made to reach the patient to complete the Transitions of Care (Post Inpatient/ED visit) call.   Cecilie Coffee [facility], BSN RN Care Manager/ Transition of Care [place]/ [facility] [phone]

## 2023-06-10 ENCOUNTER — Telehealth: Payer: Self-pay | Admitting: *Deleted

## 2023-06-10 ENCOUNTER — Other Ambulatory Visit: Payer: Self-pay | Admitting: Family

## 2023-06-10 ENCOUNTER — Encounter (HOSPITAL_COMMUNITY): Payer: Self-pay | Admitting: *Deleted

## 2023-06-10 ENCOUNTER — Encounter (HOSPITAL_COMMUNITY)

## 2023-06-10 DIAGNOSIS — I739 Peripheral vascular disease, unspecified: Secondary | ICD-10-CM

## 2023-06-10 NOTE — Transitions of Care (Post Inpatient/ED Visit) (Signed)
   06/10/2023  Name: TEDDY PENA MRN: [id number] DOB: [birth year]  Today's TOC FU Call Status: Today's TOC FU Call Status:: Unsuccessful Call (2nd Attempt) Unsuccessful Call (2nd Attempt) Date: 06/10/23 [facility] FU Call Complete Date: 06/10/23  Attempted to reach the patient regarding the most recent Inpatient/ED visit.  Follow Up Plan: Additional outreach attempts will be made to reach the patient to complete the Transitions of Care (Post Inpatient/ED visit) call.   Cecilie Coffee [facility], BSN RN Care Manager/ Transition of Care [place]/ [facility] [phone]

## 2023-06-10 NOTE — Progress Notes (Signed)
 PAD/SET Plan of Care Updates  Patient Details  Name: Leah Ferrell MRN: [id number] Date of Birth: 02-18-1951  Entry Date:  Flowsheet Row Supervised Exercise Therapy from 05/25/2023 in [place] [facility]  Date 05/25/23      Expected Discharge Date:  Flowsheet Row Supervised Exercise Therapy from 05/25/2023 in [place] [facility]  Expected Discharge Date 08/14/23       Medical Diagnosis: PAD (peripheral artery disease) [facility])  Encounter Date: 06/10/23  Exercise Target Goals: Exercise Program Goal: Individual exercise prescription set with THRR, safety & activity barriers. Participant demonstrates ability to understand and report RPE using RPE 6-20 scale, to self-measure pulse accurately, and to acknowledge the importance of the exercise prescription.  Exercise Prescription Goal: Use initial exercise assessment to set exercise prescription to improve time and distance to onset of claudication pain. Provide education to aid in steps toward risk factor modification, improve quality of life with claudication, and utilize THRR and exercise prescription for best results for decreasing symptoms of PAD. Prevent injury to bone, joints, and skin integrity during the exercise through checks of each system during session check in. Following physician guidelines for any contraindications to specific exercises.  Activity Barriers:  Activity Barriers & Cardiac Risk Stratification - 05/25/23 1413       Activity Barriers & Cardiac Risk Stratification   Activity Barriers Deconditioning;Muscular Weakness;Assistive Device;Balance Concerns;Shortness of Breath             Initial Exercise Prescription:  Initial Exercise Prescription - 05/25/23 1600       Date of Initial Exercise RX and Referring Provider   Date 05/25/23    Referring Provider Antionette Kirks MD    Expected Discharge Date 08/14/23      Oxygen   Maintain Oxygen Saturation 88% or higher       Treadmill   MPH 1.7    Grade 0    Minutes 10    METs 2.3      Prescription Details   Frequency (times per week) 3    Duration Progress to 10 minutes continuous walking  at current work load and total walking time to 30-45 min      Intensity   THRR 40-80% of Max Heartrate 89-128    Ratings of Perceived Exertion 11-13    Perceived Dyspnea 0-4      Progression   Progression Continue to follow PAD protocol      Resistance Training   Training Prescription Yes    Weight 3 lb    Reps 10-15             Perform Capillary Blood Glucose checks as needed.  Current Exercise Prescription:   Exercise Prescription Changes     Row Name 05/25/23 1600             Response to Exercise   Blood Pressure (Admit) 112/62       Blood Pressure (Exercise) 138/72       Blood Pressure (Exit) 126/74       Heart Rate (Admit) 49 bpm       Heart Rate (Exercise) 73 bpm       Heart Rate (Exit) 60 bpm       Oxygen Saturation (Admit) 94 %       Oxygen Saturation (Exercise) 96 %       Oxygen Saturation (Exit) 96 %       Rating of Perceived Exertion (Exercise) 17       Perceived Dyspnea (Exercise) 1  Symptoms CPS 4, slightly SOB       Comments treadmill test                Exercise Comments:   Exercise Comments     Row Name 05/27/23 1449           Exercise Comments First full day of exercise!  Patient was oriented to gym and equipment including functions, settings, policies, and procedures.  Patient's individual exercise prescription and treatment plan were reviewed.  All starting workloads were established based on the results of the 6 minute walk test done at initial orientation visit.  The plan for exercise progression was also introduced and progression will be customized based on patient's performance and goals.                Program and Patient Goals Exercise Goals and Review:   Exercise Goals     Row Name 05/25/23 1613             Exercise Goals    Increase Physical Activity Yes       Intervention Provide advice, education, support and counseling about physical activity/exercise needs.;Develop an individualized exercise prescription for aerobic and resistive training based on initial evaluation findings, risk stratification, comorbidities and participant's personal goals.       Expected Outcomes Long Term: Exercising regularly at least 3-5 days a week.;Short Term: Attend rehab on a regular basis to increase amount of physical activity.;Long Term: Add in home exercise to make exercise part of routine and to increase amount of physical activity.       Increase Strength and Stamina Yes       Intervention Provide advice, education, support and counseling about physical activity/exercise needs.;Develop an individualized exercise prescription for aerobic and resistive training based on initial evaluation findings, risk stratification, comorbidities and participant's personal goals.       Expected Outcomes Short Term: Increase workloads from initial exercise prescription for resistance, speed, and METs.;Short Term: Perform resistance training exercises routinely during rehab and add in resistance training at home;Long Term: Improve cardiorespiratory fitness, muscular endurance and strength as measured by increased METs and functional capacity ( )       Able to understand and use rate of perceived exertion (RPE) scale Yes       Intervention Provide education and explanation on how to use RPE scale       Expected Outcomes Short Term: Able to use RPE daily in rehab to express subjective intensity level;Long Term:  Able to use RPE to guide intensity level when exercising independently       Able to understand and use Dyspnea scale Yes       Intervention Provide education and explanation on how to use Dyspnea scale       Expected Outcomes Long Term: Able to use Dyspnea scale to guide intensity level when exercising independently;Short Term: Able to use  Dyspnea scale daily in rehab to express subjective sense of shortness of breath during exertion       Knowledge and understanding of Target Heart Rate Range (THRR) Yes       Intervention Provide education and explanation of THRR including how the numbers were predicted and where they are located for reference       Expected Outcomes Short Term: Able to state/look up THRR;Long Term: Able to use THRR to govern intensity when exercising independently;Short Term: Able to use daily as guideline for intensity in rehab       Able to  check pulse independently Yes       Intervention Provide education and demonstration on how to check pulse in carotid and radial arteries.;Review the importance of being able to check your own pulse for safety during independent exercise       Expected Outcomes Short Term: Able to explain why pulse checking is important during independent exercise;Long Term: Able to check pulse independently and accurately       Understanding of Exercise Prescription Yes       Intervention Provide education, explanation, and written materials on patient's individual exercise prescription       Expected Outcomes Short Term: Able to explain program exercise prescription;Long Term: Able to explain home exercise prescription to exercise independently       Improve claudication pain toleration; Improve walking ability Yes       Intervention Participate in PAD/SET Rehab 2-3 days a week, walking at home as part of exercise prescription;Attend education sessions to aid in risk factor modification and understanding of disease process       Expected Outcomes Short Term: Improve walking distance/time to onset of claudication pain;Long Term: Improve walking ability and toleration to claudication;Long Term: Improve score of PAD questionnaires                Nutrition:  Target Goals: Understanding of nutrition guidelines, daily intake of sodium 1500mg , cholesterol 200mg , calories 30% from fat and 7% or  less from saturated fats, daily to have 5 or more servings of fruits and vegetables.  Biometrics:  Pre Biometrics - 05/25/23 1614       Pre Biometrics   Height 5' 1.25" (1.556 m)    Weight 206 lb (93.4 kg)    Waist Circumference 40 inches    Hip Circumference 46.25 inches    Waist to Hip Ratio 0.86 %    BMI (Calculated) 38.59    Grip Strength 33 kg    Single Leg Stand 3.1 seconds              Nutrition Therapy Plan and Nutrition Goals:  Nutrition Therapy & Goals - 05/25/23 1615       Intervention Plan   Intervention Prescribe, educate and counsel regarding individualized specific dietary modifications aiming towards targeted core components such as weight, hypertension, lipid management, diabetes, heart failure and other comorbidities.;Nutrition handout(s) given to patient.    Expected Outcomes Short Term Goal: Understand basic principles of dietary content, such as calories, fat, sodium, cholesterol and nutrients.;Long Term Goal: Adherence to prescribed nutrition plan.             Core Components/Risk Factors/Patient Goals at Admission:  Personal Goals and Risk Factors at Admission - 05/25/23 1619       Core Components/Risk Factors/Patient Goals on Admission    Weight Management Obesity;Yes;Weight Loss    Intervention Weight Management: Develop a combined nutrition and exercise program designed to reach desired caloric intake, while maintaining appropriate intake of nutrient and fiber, sodium and fats, and appropriate energy expenditure required for the weight goal.;Weight Management: Provide education and appropriate resources to help participant work on and attain dietary goals.;Weight Management/Obesity: Establish reasonable short term and long term weight goals.;Obesity: Provide education and appropriate resources to help participant work on and attain dietary goals.    Admit Weight 206 lb (93.4 kg)    Goal Weight: Short Term 201 lb (91.2 kg)    Goal Weight: Long  Term 196 lb (88.9 kg)    Expected Outcomes Short Term: Continue to assess and  modify interventions until short term weight is achieved;Long Term: Adherence to nutrition and physical activity/exercise program aimed toward attainment of established weight goal;Weight Loss: Understanding of general recommendations for a balanced deficit meal plan, which promotes 1-2 lb weight loss per week and includes a negative energy balance of [phone] kcal/d;Understanding recommendations for meals to include 15-35% energy as protein, 25-35% energy from fat, 35-60% energy from carbohydrates, less than 200mg  of dietary cholesterol, 20-35 gm of total fiber daily;Understanding of distribution of calorie intake throughout the day with the consumption of 4-5 meals/snacks    Improve shortness of breath with ADL's Yes    Intervention Provide education, individualized exercise plan and daily activity instruction to help decrease symptoms of SOB with activities of daily living.    Expected Outcomes Short Term: Improve cardiorespiratory fitness to achieve a reduction of symptoms when performing ADLs;Long Term: Be able to perform more ADLs without symptoms or delay the onset of symptoms    Diabetes Yes    Intervention Provide education about signs/symptoms and action to take for hypo/hyperglycemia.;Provide education about proper nutrition, including hydration, and aerobic/resistive exercise prescription along with prescribed medications to achieve blood glucose in normal ranges: Fasting glucose 65-99 mg/dL    Expected Outcomes Short Term: Participant verbalizes understanding of the signs/symptoms and immediate care of hyper/hypoglycemia, proper foot care and importance of medication, aerobic/resistive exercise and nutrition plan for blood glucose control.;Long Term: Attainment of HbA1C < 7%.    Heart Failure Yes    Intervention Provide a combined exercise and nutrition program that is supplemented with education, support and  counseling about heart failure. Directed toward relieving symptoms such as shortness of breath, decreased exercise tolerance, and extremity edema.    Expected Outcomes Improve functional capacity of life;Short term: Attendance in program 2-3 days a week with increased exercise capacity. Reported lower sodium intake. Reported increased fruit and vegetable intake. Reports medication compliance.;Short term: Daily weights obtained and reported for increase. Utilizing diuretic protocols set by physician.;Long term: Adoption of self-care skills and reduction of barriers for early signs and symptoms recognition and intervention leading to self-care maintenance.    Hypertension Yes    Intervention Provide education on lifestyle modifcations including regular physical activity/exercise, weight management, moderate sodium restriction and increased consumption of fresh fruit, vegetables, and low fat dairy, alcohol  moderation, and smoking cessation.;Monitor prescription use compliance.    Expected Outcomes Short Term: Continued assessment and intervention until BP is < 140/65mm HG in hypertensive participants. < 130/75mm HG in hypertensive participants with diabetes, heart failure or chronic kidney disease.;Long Term: Maintenance of blood pressure at goal levels.    Lipids Yes    Intervention Provide education and support for participant on nutrition & aerobic/resistive exercise along with prescribed medications to achieve LDL 70mg , HDL >40mg .    Expected Outcomes Short Term: Participant states understanding of desired cholesterol values and is compliant with medications prescribed. Participant is following exercise prescription and nutrition guidelines.;Long Term: Cholesterol controlled with medications as prescribed, with individualized exercise RX and with personalized nutrition plan. Value goals: LDL < 70mg , HDL > 40 mg.             ITP Comments:  ITP Comments     Row Name 05/18/23 1034 05/25/23 1601  05/27/23 1448 05/29/23 1500     ITP Comments Virtual orientation visit completed for the PAD program. On-site orientation visit scheduled for 05/20/23 at 1300. Documentation mailed and patient says her daughter helped her complete. Patient attend orientation today.  Patient is attending Supervised  Exercise Therapy for PAD.  Documentation for diagnosis can be found in [facility] 04/28/23.  Reviewed medical chart, RPE/RPD, gym safety, and program guidelines.  Patient was fitted to equipment they will be using during rehab.  Patient is scheduled to start exercise on Wednesday 05/27/23 at 1430.   Initial ITP created and sent for review and signature by Dr. Antionette Kirks, referring physician. First full day of exercise!  Patient was oriented to gym and equipment including functions, settings, policies, and procedures.  Patient's individual exercise prescription and treatment plan were reviewed.  All starting workloads were established based on the results of the 6 minute walk test done at initial orientation visit.  The plan for exercise progression was also introduced and progression will be customized based on patient's performance and goals. [name] did not complete her rehab session.  Patient informed staff that she was in the ED yesterday with her systolic blood pressure 200 and she had dizziness. There were no acute abnormal findings in the ED. She was told to follow up with her PCP. She was not able to get an appointment with them until 4/29. She stated the dizziness had subsided. Her blood pressure was 148/60 which is higher than usual. We informed her that she needed medical clearance before resuming exercise. She verbalized understanding and was going to try and get an earlier appointment with her PCP. I sent a message to Dr. Alvenia Aus to request he review her ED notes and give clearance for her to return due to her not being able to make up missed appointments. He said from his standpoint, she was okay to  return to the program. Patient will be notified.             Comments: 30 day review   Physician Documentation: Your signature/cosign is required to indicate approval of the Care Plan as stated above. By signing this report, you are approving the Plan of Care. Please sign and either send electronically or print and fax the signed copy to the number below. Please indicate any changes or limitations in the space provided.   ____________________________________________________________________________________________________________________________________________  Physician Signature: _____________________________________________________ Print Physician Name: ____________________________________________________ Date: ___________________________ Time: _________________________________  [place]  [facility] [address] [place], [place] [address]  Phone: [phone] Fax: [phone]

## 2023-06-11 ENCOUNTER — Telehealth: Payer: Self-pay

## 2023-06-11 NOTE — Telephone Encounter (Signed)
 Copied from CRM [id number]. Topic: Clinical - Medical Advice >> Jun 11, 2023 11:07 AM Everlene Hobby D wrote: Patient would like a call from Dr. Delaney Fearing nurse she just got out of hospital Monday and can't go to rehab without her pcp seeing her. She doesn't want to setup appointment she is asking for Odilia Bennett to help her and give her a call. [phone]

## 2023-06-11 NOTE — Transitions of Care (Post Inpatient/ED Visit) (Signed)
   06/11/2023  Name: Leah QUIZHPI MRN: [id number] DOB: [birth year]  Today's TOC FU Call Status: Today's TOC FU Call Status:: Unsuccessful Call (3rd Attempt) Unsuccessful Call (3rd Attempt) Date: 06/11/23  Attempted to reach the patient regarding the most recent Inpatient/ED visit.  Follow Up Plan: No further outreach attempts will be made at this time. We have been unable to contact the patient.  Orpha Blade, RN, BSN, CEN [employer]- Transition of Care Team.  [facility] [phone]

## 2023-06-11 NOTE — Telephone Encounter (Signed)
 Appointment scheduled.

## 2023-06-12 ENCOUNTER — Encounter (HOSPITAL_COMMUNITY)

## 2023-06-12 ENCOUNTER — Ambulatory Visit: Payer: Self-pay

## 2023-06-12 NOTE — Telephone Encounter (Signed)
 Copied from CRM [id number]. Topic: Clinical - Red Word Triage >> Jun 12, 2023  5:23 PM Felizardo Hotter wrote: Red Word that prompted transfer to Nurse Triage: Pt is dizzy, blood pressure is 88.  Chief Complaint: lpw BP Symptoms: BP 88/42 HR 52, lightheaded Frequency: today Pertinent Negatives: Patient denies chest pain or SOB Disposition: [] ED /[] Urgent Care (no appt availability in office) / [] Appointment(In office/virtual)/ []  [facility]/ [x] Home Care/ [] Refused Recommended Disposition /[] [facility]/ []  Follow-up with PCP Additional Notes: pt reported earlier having low BP and some lightheadedness.  nurse got pt to recheck BP: now BP @1735  BP 154/48 51.  Pt reports at this time no lightheaded ness.    Reason for Disposition  [1] Fall in systolic BP > 20 mm Hg from normal AND [2] NOT dizzy, lightheaded, or weak    No ;low BP or s/s of low BP at present time / pt only needs to follow up with PCP if low BP occurs again.  Nurse also instructed to pt to go to ED if over weekend pt has issues with low BP.  Answer Assessment - Initial Assessment Questions 1. BLOOD PRESSURE: "What is the blood pressure?" "Did you take at least two measurements 5 minutes apart?"     154/48 2. ONSET: "When did you take your blood pressure?"     today 3. HOW: "How did you obtain the blood pressure?" (e.g., visiting nurse, automatic home BP monitor)     automatic 4. HISTORY: "Do you have a history of low blood pressure?" "What is your blood pressure normally?"     yes 5. MEDICINES: "Are you taking any medications for blood pressure?" If Yes, ask: "Have they been changed recently?"     yes 6. PULSE RATE: "Do you know what your pulse rate is?"      52 7. OTHER SYMPTOMS: "Have you been sick recently?" "Have you had a recent injury?"     N/a 8. PREGNANCY: "Is there any chance you are pregnant?" "When was your last menstrual period?"     N/a  Protocols used: Blood Pressure - Low-A-AH

## 2023-06-15 ENCOUNTER — Encounter: Payer: Self-pay | Admitting: Family

## 2023-06-15 ENCOUNTER — Ambulatory Visit (INDEPENDENT_AMBULATORY_CARE_PROVIDER_SITE_OTHER): Admitting: Family

## 2023-06-15 ENCOUNTER — Encounter (HOSPITAL_COMMUNITY)

## 2023-06-15 VITALS — BP 125/65 | HR 50 | Temp 97.6°F | Ht 62.0 in | Wt 198.2 lb

## 2023-06-15 DIAGNOSIS — I10 Essential (primary) hypertension: Secondary | ICD-10-CM | POA: Diagnosis not present

## 2023-06-15 DIAGNOSIS — Z09 Encounter for follow-up examination after completed treatment for conditions other than malignant neoplasm: Secondary | ICD-10-CM | POA: Diagnosis not present

## 2023-06-15 DIAGNOSIS — I5032 Chronic diastolic (congestive) heart failure: Secondary | ICD-10-CM

## 2023-06-15 DIAGNOSIS — E1151 Type 2 diabetes mellitus with diabetic peripheral angiopathy without gangrene: Secondary | ICD-10-CM

## 2023-06-15 DIAGNOSIS — R531 Weakness: Secondary | ICD-10-CM | POA: Diagnosis not present

## 2023-06-15 DIAGNOSIS — I252 Old myocardial infarction: Secondary | ICD-10-CM

## 2023-06-15 NOTE — Progress Notes (Signed)
 Subjective:    Patient ID: Leah Ferrell, female    DOB: March 25, 1950, 73 y.o.   MRN: [id number]  Chief Complaint  Patient presents with   Hospitalization Follow-up   Pt presents to the office today for hospital follow up. She went to the ED for SOB and diagnosed with CHF excerebration.   She is taking Norvasc  10 mg, Coreg  25 mg BID, Lasix  40 mg, Lisinopril  20 mg, and spironolactone  25 mg.    Chest x-ray showed cardiomegaly  with vascular congestion and diffuse interstitial opacity favored to be secondary to edema. Possible tiny pleural effusions.   Echo on 06/06/2023 with EF of 60-65 %, no regional wall motion abnormalities, no mitral stenosis, prior echo from February 2025 with grade 1 diastolic dysfunction.   She is followed by Cardiologists and next appointment June.   Doing PT TID a week.      06/15/2023    1:43 PM 06/08/2023    5:35 AM 06/07/2023    5:00 AM  Last 3 Weights  Weight (lbs) 198 lb 3.2 oz 199 lb 14.4 oz 199 lb 11.8 oz  Weight (kg) 89.903 kg 90.674 kg 90.6 kg     Hypertension This is a chronic problem. The current episode started more than 1 year ago. The problem has been resolved since onset. The problem is controlled. Associated symptoms include malaise/fatigue. Pertinent negatives include no blurred vision, peripheral edema, shortness of breath or sweats. Risk factors for coronary artery disease include dyslipidemia, diabetes mellitus, obesity and sedentary lifestyle. Past treatments include beta blockers, calcium  channel blockers and diuretics. The current treatment provides mild improvement. Compliance problems include diet.  Hypertensive end-organ damage includes kidney disease, heart failure and PVD.  Diabetes She presents for her follow-up diabetic visit. She has type 2 diabetes mellitus. Pertinent negatives for hypoglycemia include no sweats. Associated symptoms include fatigue and foot paresthesias. Pertinent negatives for diabetes include no blurred vision.  Diabetic complications include PVD. Risk factors for coronary artery disease include diabetes mellitus, dyslipidemia, hypertension, sedentary lifestyle and post-menopausal. She is following a generally unhealthy diet. Her overall blood glucose range is 140-180 mg/dl.  Congestive Heart Failure Presents for follow-up visit. Associated symptoms include fatigue. Pertinent negatives include no edema or shortness of breath. The symptoms have been improving.      Review of Systems  Constitutional:  Positive for fatigue and malaise/fatigue.  Eyes:  Negative for blurred vision.  Respiratory:  Negative for shortness of breath.   All other systems reviewed and are negative.   Social History   Socioeconomic History   Marital status: Widowed    Spouse name: Not on file   Number of children: 4   Years of education: completed school in [place]    Highest education level: Not on file  Occupational History   Occupation: retired  Tobacco Use   Smoking status: Former    Current packs/day: 0.00    Average packs/day: 1 pack/day for 36.8 years (36.8 ttl pk-yrs)    Types: Cigarettes    Start date: 04/21/1964    Quit date: 02/17/2001    Years since quitting: 22.3   Smokeless tobacco: Never  Vaping Use   Vaping status: Never Used  Substance and Sexual Activity   Alcohol  use: No    Alcohol /week: 0.0 standard drinks of alcohol    Drug use: No   Sexual activity: Not Currently    Birth control/protection: Post-menopausal  Other Topics Concern   Not on file  Social History Narrative   Lives  alone - one level - all of her children live far away   Social Drivers of Health   Financial Resource Strain: Low Risk  (12/25/2022)   Overall Financial Resource Strain (CARDIA)    Difficulty of Paying Living Expenses: Not hard at all  Food Insecurity: No Food Insecurity (04/06/2023)   Hunger Vital Sign    Worried About Running Out of Food in the Last Year: Never true    Ran Out of Food in the Last Year: Never  true  Transportation Needs: No Transportation Needs (04/06/2023)   PRAPARE - Administrator, Civil Service (Medical): No    Lack of Transportation (Non-Medical): No  Physical Activity: Insufficiently Active (12/25/2022)   Exercise Vital Sign    Days of Exercise per Week: 3 days    Minutes of Exercise per Session: 30 min  Stress: No Stress Concern Present (12/25/2022)   [employer] of Occupational Health - Occupational Stress Questionnaire    Feeling of Stress : Not at all  Social Connections: Moderately Isolated (04/06/2023)   Social Connection and Isolation Panel [NHANES]    Frequency of Communication with Friends and Family: More than three times a week    Frequency of Social Gatherings with Friends and Family: More than three times a week    Attends Religious Services: 1 to 4 times per year    Active Member of [employer] or Organizations: No    Attends Banker Meetings: Never    Marital Status: Widowed   Family History  Problem Relation Age of Onset   Diabetes Mother    Hyperlipidemia Mother    Hypertension Mother    Peripheral vascular disease Mother    Other Sister        drowned   Other Sister        drowned   Diabetes Daughter        borderline   Hypertension Daughter    Diabetes Maternal Grandmother    Heart attack Maternal Grandfather    Heart disease Brother    Diabetes Brother    Alcohol  abuse Brother    Diabetes Son    Hypertension Son    Hyperlipidemia Son    Breast cancer Neg Hx         Objective:   Physical Exam Vitals reviewed.  Constitutional:      General: She is not in acute distress.    Appearance: She is well-developed. She is obese.  HENT:     Head: Normocephalic and atraumatic.     Right Ear: Tympanic membrane normal.     Left Ear: Tympanic membrane normal.  Eyes:     Pupils: Pupils are equal, round, and reactive to light.  Neck:     Thyroid : No thyromegaly.  Cardiovascular:     Rate and Rhythm: Normal rate and  regular rhythm.     Heart sounds: Normal heart sounds. No murmur heard. Pulmonary:     Effort: Pulmonary effort is normal. No respiratory distress.     Breath sounds: Normal breath sounds. No wheezing.  Abdominal:     General: Bowel sounds are normal. There is no distension.     Palpations: Abdomen is soft.     Tenderness: There is no abdominal tenderness.  Musculoskeletal:        General: No tenderness. Normal range of motion.     Cervical back: Normal range of motion and neck supple.  Skin:    General: Skin is warm and dry.  Neurological:  Mental Status: She is alert and oriented to person, place, and time.     Cranial Nerves: No cranial nerve deficit.     Deep Tendon Reflexes: Reflexes are normal and symmetric.  Psychiatric:        Behavior: Behavior normal.        Thought Content: Thought content normal.        Judgment: Judgment normal.       BP 125/65   Pulse (!) 50   Temp 97.6 F (36.4 C) (Temporal)   Ht 5\' 2"  (1.575 m)   Wt 198 lb 3.2 oz (89.9 kg)   SpO2 96%   BMI 36.25 kg/m      Assessment & Plan:  Leah Ferrell comes in today with chief complaint of Hospitalization Follow-up   Diagnosis and orders addressed:  1. Essential hypertension (Primary) At goal today! - CMP14+EGFR - For home use only DME Other see comment  2. Diabetes mellitus with peripheral vascular disease (HCC) - CMP14+EGFR - For home use only DME Other see comment  3. Hx of non-ST elevation myocardial infarction (NSTEMI) - CMP14+EGFR  4. Chronic diastolic heart failure (HCC) - CMP14+EGFR  5. Hospital discharge follow-up - CMP14+EGFR  6. Weakness Fall precautions discussed  - CMP14+EGFR - Shower chair - For home use only DME Other see comment   Labs pending Ok to resume Physical therapy  Continue current medications  Shower chair rx given Keep follow up with specialists  Health Maintenance reviewed Diet and exercise encouraged  Return if symptoms worsen or fail  to improve.    Tommas Fragmin, FNP

## 2023-06-15 NOTE — Telephone Encounter (Signed)
Has apt today with PCP.

## 2023-06-15 NOTE — Patient Instructions (Signed)
 Heart Failure: What to Know  Heart failure means that your heart isn't able to pump blood the way it should. The heart might not be able to pump enough blood and oxygen to your body tissues. Heart failure is usually a long-term condition. Be sure to take good care of yourself and follow your treatment plan. Different stages of heart failure have different treatment plans. The stages are: Stage A: At risk for heart failure. You don't have any symptoms, but you're at risk for getting heart failure. Stage B: Pre-heart failure. You don't have any symptoms, but your heart has changes that show heart failure. Stage C: Symptomatic heart failure. You have symptoms of heart failure, and your heart has changed in ways that show heart failure. Stage D: Advanced heart failure. You have symptoms that make it hard to live your daily life, and you often need to stay in the hospital because of heart failure. What are the causes? Heart failure may be caused by: High blood pressure. Coronary artery disease. This is when cholesterol and fat build up in the arteries. Heart attack. Heart valves that don't open and close properly. Damage to the heart muscle. An infection of the heart muscle. Lung disease. What increases the risk? Getting older. The risk of heart failure goes up as a person ages. Using tobacco or nicotine products. Being overweight. Using alcohol or drugs. Having any of these conditions: Diabetes. Abnormal heart rhythms. Thyroid problems. Chronic kidney disease. Having a family history of heart failure. Having taken medicines that can damage the heart. What are the signs or symptoms? Symptoms of heart failure include: Shortness of breath. This may happen when doing things like climbing stairs. A cough that doesn't go away. Swelling of the feet, ankles, legs, or belly. This is called edema. Losing or gaining weight for no reason. Trouble breathing when lying flat. A fast  heartbeat. Other symptoms may include: Feeling tired and not having energy. Feeling dizzy or light-headed. You may feel like you're going to faint. Not wanting to eat as much as normal. Feeling like you may vomit. Feeling confused. How is this diagnosed? Heart failure may be diagnosed based on: Your symptoms and medical history. A physical exam. Blood tests. Other tests. These may include: Chest X-ray. Electrocardiogram (ECG). Echocardiogram. Cardiac MRI. Cardiac catheterization and angiogram. How is this treated? Heart failure may be treated with: Medicines. These can be given to: Treat blood pressure, lower heart rates, or make the heart muscle pump stronger. Cause the kidneys to remove extra salt and water from the blood through your pee. Changes in your daily life. These may include: Eating a healthy diet. Staying at a healthy weight. Quitting tobacco or drug use. Limiting or avoiding alcohol. Getting regular exercise. Taking part in a cardiac rehab program. This program helps you improve your health through exercise, education, and counseling. Surgery. Surgery can be done to: Open blocked arteries. Repair valves. Put a device in the heart. This might be a pacemaker, a device to treat abnormal heart rhythms, or a device to help the heart pump better. A heart transplant. This means getting a healthy heart from a donor. This is done when other treatments have not helped. Follow these instructions at home: Treat other conditions as told by your health care provider. These may include high blood pressure or lung disease. Learn as much as you can about heart failure. Keep all follow-up visits. Your provider will want to check on your condition. Where to find more information American  Heart Association: heart.org Centers for Disease Control and Prevention: RipHit.se This information is not intended to replace advice given to you by your health care provider. Make  sure you discuss any questions you have with your health care provider. Document Revised: 12/16/2022 Document Reviewed: 07/07/2022 Elsevier Patient Education  2024 [employer].

## 2023-06-16 ENCOUNTER — Other Ambulatory Visit

## 2023-06-16 ENCOUNTER — Inpatient Hospital Stay: Admitting: Family

## 2023-06-16 ENCOUNTER — Other Ambulatory Visit: Payer: Self-pay | Admitting: Family

## 2023-06-16 DIAGNOSIS — E875 Hyperkalemia: Secondary | ICD-10-CM

## 2023-06-16 LAB — CMP14+EGFR
ALT: 32 IU/L (ref 0–32)
AST: 26 IU/L (ref 0–40)
Albumin: 4.1 g/dL (ref 3.8–4.8)
Alkaline Phosphatase: 68 IU/L (ref 44–121)
BUN/Creatinine Ratio: 23 (ref 12–28)
BUN: 32 mg/dL — ABNORMAL HIGH (ref 8–27)
Bilirubin Total: 0.3 mg/dL (ref 0.0–1.2)
CO2: 26 mmol/L (ref 20–29)
Calcium: 9.8 mg/dL (ref 8.7–10.3)
Chloride: 92 mmol/L — ABNORMAL LOW (ref 96–106)
Creatinine, Ser: 1.4 mg/dL — ABNORMAL HIGH (ref 0.57–1.00)
Globulin, Total: 3.1 g/dL (ref 1.5–4.5)
Glucose: 200 mg/dL — ABNORMAL HIGH (ref 70–99)
Potassium: 6.2 mmol/L (ref 3.5–5.2)
Sodium: 130 mmol/L — ABNORMAL LOW (ref 134–144)
Total Protein: 7.2 g/dL (ref 6.0–8.5)
eGFR: 40 mL/min/{1.73_m2} — ABNORMAL LOW (ref >59)

## 2023-06-17 ENCOUNTER — Encounter (HOSPITAL_COMMUNITY)
Admission: RE | Admit: 2023-06-17 | Discharge: 2023-06-17 | Disposition: A | Discharge status: Home / Self Care | Source: Ambulatory Visit | Attending: Cardiovascular Disease

## 2023-06-17 DIAGNOSIS — I739 Peripheral vascular disease, unspecified: Secondary | ICD-10-CM

## 2023-06-17 DIAGNOSIS — I70213 Atherosclerosis of native arteries of extremities with intermittent claudication, bilateral legs: Secondary | ICD-10-CM | POA: Diagnosis not present

## 2023-06-17 DIAGNOSIS — Z955 Presence of coronary angioplasty implant and graft: Secondary | ICD-10-CM | POA: Diagnosis not present

## 2023-06-17 DIAGNOSIS — Z48812 Encounter for surgical aftercare following surgery on the circulatory system: Secondary | ICD-10-CM | POA: Diagnosis not present

## 2023-06-17 LAB — BMP8+EGFR
BUN/Creatinine Ratio: 28 (ref 12–28)
BUN: 35 mg/dL — ABNORMAL HIGH (ref 8–27)
CO2: 23 mmol/L (ref 20–29)
Calcium: 9.2 mg/dL (ref 8.7–10.3)
Chloride: 97 mmol/L (ref 96–106)
Creatinine, Ser: 1.27 mg/dL — ABNORMAL HIGH (ref 0.57–1.00)
Glucose: 214 mg/dL — ABNORMAL HIGH (ref 70–99)
Potassium: 5.5 mmol/L — ABNORMAL HIGH (ref 3.5–5.2)
Sodium: 134 mmol/L (ref 134–144)
eGFR: 45 mL/min/{1.73_m2} — ABNORMAL LOW (ref >59)

## 2023-06-17 LAB — GLUCOSE, CAPILLARY: Glucose-Capillary: 119 mg/dL — ABNORMAL HIGH (ref 70–99)

## 2023-06-17 NOTE — Progress Notes (Signed)
 Daily Session Note  Patient Details  Name: Leah Ferrell MRN: [id number] Date of Birth: [birth year] Referring Provider:   Lorrane Rosette Row Supervised Exercise Therapy from 05/25/2023 in [facility] CARD[facility]AC REHABILITATION  Referring Provider Antionette Kirks MD       Encounter Date: 06/17/2023  Check In:  Session Check In - 06/17/23 1410       Check-In   Supervising physician immediately available to respond to emergencies See telemetry face sheet for immediately available MD    Location [facility]    Staff Present Ronna Coho BSN, RN;Jessica [place], [place], RCEP, CCRP, CCET;Brooke [place], RN    Virtual Visit No    Medication changes reported     No    Fall or balance concerns reported    No    Tobacco Cessation No Change    Warm-up and Cool-down Performed on first and last piece of equipment    Resistance Training Performed Yes    VAD Patient? No    PAD/SET Patient? Yes      PAD/SET Patient   Completed foot check today? Yes    Open wounds to report? No      Pain Assessment   Currently in Pain? No/denies    Pain Score 0-No pain    Multiple Pain Sites No             Capillary Blood Glucose: No results found. However, due to the size of the patient record, not all encounters were searched. Please check Results Review for a complete set of results.    Social History   Tobacco Use  Smoking Status Former   Current packs/day: 0.00   Average packs/day: 1 pack/day for 36.8 years (36.8 ttl pk-yrs)   Types: Cigarettes   Start date: 04/21/1964   Quit date: 02/17/2001   Years since quitting: 22.3  Smokeless Tobacco Never    Goals Met:  Independence with exercise equipment Exercise tolerated well No report of concerns or symptoms today Strength training completed today  Goals Unmet:  Not Applicable  Comments: Leah AasAaron AasPt able to follow exercise prescription today without complaint.  Will continue to monitor for progression.

## 2023-06-19 ENCOUNTER — Ambulatory Visit: Payer: Self-pay

## 2023-06-19 ENCOUNTER — Encounter (HOSPITAL_COMMUNITY)
Admission: RE | Admit: 2023-06-19 | Discharge: 2023-06-19 | Disposition: A | Discharge status: Home / Self Care | Source: Ambulatory Visit | Attending: Cardiovascular Disease | Admitting: Cardiovascular Disease

## 2023-06-19 ENCOUNTER — Other Ambulatory Visit: Payer: Self-pay | Admitting: Family

## 2023-06-19 DIAGNOSIS — I70213 Atherosclerosis of native arteries of extremities with intermittent claudication, bilateral legs: Secondary | ICD-10-CM | POA: Insufficient documentation

## 2023-06-19 DIAGNOSIS — I739 Peripheral vascular disease, unspecified: Secondary | ICD-10-CM

## 2023-06-19 DIAGNOSIS — E1169 Type 2 diabetes mellitus with other specified complication: Secondary | ICD-10-CM

## 2023-06-19 LAB — GLUCOSE, CAPILLARY: Glucose-Capillary: 79 mg/dL (ref 70–99)

## 2023-06-19 NOTE — Progress Notes (Signed)
 Daily Session Note  Patient Details  Name: SHAKERIA OPIE MRN: [id number] Date of Birth: 1950/11/01 Referring Provider:   Lorrane Rosette Row Supervised Exercise Therapy from 05/25/2023 in [facility] CARDIAC REHABILITATION  Referring Provider Antionette Kirks MD       Encounter Date: 06/19/2023  Check In:  Session Check In - 06/19/23 1433       Check-In   Supervising physician immediately available to respond to emergencies See telemetry face sheet for immediately available MD    Location [facility]    Staff Present Jerrol Morelle, BSN, RN, WTA-C;Heather Toy Freund, BS, Exercise Physiologist    Virtual Visit No    Medication changes reported     No    Fall or balance concerns reported    No    Tobacco Cessation No Change    Resistance Training Performed Yes    VAD Patient? No    PAD/SET Patient? Yes      PAD/SET Patient   Completed foot check today? Yes    Open wounds to report? No      Pain Assessment   Currently in Pain? No/denies             Capillary Blood Glucose: No results found. However, due to the size of the patient record, not all encounters were searched. Please check Results Review for a complete set of results.    Social History   Tobacco Use  Smoking Status Former   Current packs/day: 0.00   Average packs/day: 1 pack/day for 36.8 years (36.8 ttl pk-yrs)   Types: Cigarettes   Start date: 04/21/1964   Quit date: 02/17/2001   Years since quitting: 22.3  Smokeless Tobacco Never    Goals Met:  Independence with exercise equipment Exercise tolerated well No report of concerns or symptoms today Strength training completed today  Goals Unmet:  Not Applicable  Comments: Pt able to follow exercise prescription today without complaint.  Will continue to monitor for progression.

## 2023-06-19 NOTE — Telephone Encounter (Signed)
 Copied from CRM [id number]. Topic: Clinical - Red Word Triage >> Jun 19, 2023 10:00 AM Brynn Caras wrote: Red Word that prompted transfer to Nurse Triage: bp is lower than normal, patient is feeling lightheaded/dizzy again.   Chief Complaint: Low blood pressure  Symptoms: Mild dizziness  Frequency: Intermittent  Disposition: [] ED /[] Urgent Care (no appt availability in office) / [x] Appointment(In office/virtual)/ []  [facility]/ [] Home Care/ [x] Refused Recommended Disposition /[] [facility]/ []  Follow-up with PCP Additional Notes: Patient reports that she has been experiencing low blood pressures with associated dizziness recently. She states that she checked her blood pressure 15 minutes ago and it was 118/53, and then 5 minutes later it was 110/54. Patient states that she has not taken her blood pressure medication because she was concerned about her low blood pressure. She states that she will take her medication to her physical therapy appointment today and will have her blood pressure checked there. I discussed an appointment with the patient but she declined, stating she wanted to make [facility] aware and would like some advice on things she can do or to see if she would like to adjust her medication. Please advise.    Reason for Disposition  [1] Systolic BP 90-110 AND [2] taking blood pressure medications AND [3] dizzy, lightheaded or weak    Has not taken medication today  Answer Assessment - Initial Assessment Questions 1. BLOOD PRESSURE: "What is the blood pressure?" "Did you take at least two measurements 5 minutes apart?"     118/53, 110/54 2. ONSET: "When did you take your blood pressure?"     15 minutes ago  3. HOW: "How did you obtain the blood pressure?" (e.g., visiting nurse, automatic home BP monitor)     Automatic BP cuff  4. HISTORY: "Do you have a history of low blood pressure?" "What is your blood pressure normally?"     Has had recent low blood  pressures  5. MEDICINES: "Are you taking any medications for blood pressure?" If Yes, ask: "Have they been changed recently?"     Yes, states they have been increased recently  7. OTHER SYMPTOMS: "Have you been sick recently?" "Have you had a recent injury?"     Dizziness  Protocols used: Blood Pressure - Low-A-AH

## 2023-06-19 NOTE — Telephone Encounter (Signed)
 Patient wants Leah Ferrell to be aware. Advised I would forward to her and she could review when she returns from vacation

## 2023-06-19 NOTE — Telephone Encounter (Signed)
 Those are not low readings- they are actually good

## 2023-06-22 ENCOUNTER — Encounter (HOSPITAL_COMMUNITY)
Admission: RE | Admit: 2023-06-22 | Discharge: 2023-06-22 | Disposition: A | Discharge status: Home / Self Care | Source: Ambulatory Visit | Attending: Cardiovascular Disease | Admitting: Cardiovascular Disease

## 2023-06-22 DIAGNOSIS — I70213 Atherosclerosis of native arteries of extremities with intermittent claudication, bilateral legs: Secondary | ICD-10-CM | POA: Diagnosis not present

## 2023-06-22 DIAGNOSIS — I739 Peripheral vascular disease, unspecified: Secondary | ICD-10-CM

## 2023-06-22 LAB — GLUCOSE, CAPILLARY
Glucose-Capillary: 168 mg/dL — ABNORMAL HIGH (ref 70–99)
Glucose-Capillary: 128 mg/dL — ABNORMAL HIGH (ref 70–99)

## 2023-06-22 NOTE — Progress Notes (Signed)
 Daily Session Note  Patient Details  Name: Leah Ferrell MRN: [id number] Date of Birth: 05-11-1950 Referring Provider:   Lorrane Rosette Row Supervised Exercise Therapy from 05/25/2023 in [facility] CARDIAC REHABILITATION  Referring Provider Antionette Kirks MD       Encounter Date: 06/22/2023  Check In:  Session Check In - 06/22/23 1439       Check-In   Supervising physician immediately available to respond to emergencies See telemetry face sheet for immediately available MD    Location [facility]    Staff Present Jerrol Morelle, BSN, RN, WTA-C;Heather Toy Freund, BS, Exercise Physiologist;Lulu Hirschmann Zoila Hines, MA, RCEP, CCRP, CCET    Virtual Visit No    Medication changes reported     No    Fall or balance concerns reported    No    Warm-up and Cool-down Performed on first and last piece of equipment    Resistance Training Performed Yes    VAD Patient? No    PAD/SET Patient? Yes      PAD/SET Patient   Completed foot check today? Yes    Open wounds to report? No      Pain Assessment   Currently in Pain? No/denies             Capillary Blood Glucose: No results found. However, due to the size of the patient record, not all encounters were searched. Please check Results Review for a complete set of results.    Social History   Tobacco Use  Smoking Status Former   Current packs/day: 0.00   Average packs/day: 1 pack/day for 36.8 years (36.8 ttl pk-yrs)   Types: Cigarettes   Start date: 04/21/1964   Quit date: 02/17/2001   Years since quitting: 22.3  Smokeless Tobacco Never    Goals Met:  Proper associated with RPD/PD & O2 Sat Independence with exercise equipment Exercise tolerated well No report of concerns or symptoms today Strength training completed today  Goals Unmet:  Not Applicable  Comments: Pt able to follow exercise prescription today without complaint.  Will continue to monitor for progression.

## 2023-06-23 ENCOUNTER — Telehealth: Payer: Self-pay | Admitting: Cardiovascular Disease

## 2023-06-23 DIAGNOSIS — I150 Renovascular hypertension: Secondary | ICD-10-CM

## 2023-06-23 NOTE — Telephone Encounter (Signed)
 Pt c/o BP issue: STAT if pt c/o blurred vision, one-sided weakness or slurred speech  1. What are your last 5 BP readings? 119/61  2. Are you having any other symptoms (ex. Dizziness, headache, blurred vision, passed out)? Dizziness and lightheaded  3. What is your BP issue? Pt is concerned about her BP number and she's unsure of what medication she should or shouldn't take because of it since she doesn't want it to drop lower. Please advise

## 2023-06-23 NOTE — Telephone Encounter (Signed)
 Returned call to patient who states she checks BP daily in the mornings prior to medication and breakfast: 06/23/23 119/61 (held Lisinopril ) 06/22/23 125/65 She gives more readings, but unsure of what date they were taken: 132/66, 156/54, and 124/51.   "I have not taken all my blood pressure pills over the last several weeks." She denies taking Amlodipine  10mg , spironolactone  25mg  she stopped because she's on Lasix  and feels she is afraid she will lose too much fluid, and sometimes will skip Lisinopril  20mg  AM dose due to low BP. She has decreased herself on her Coreg  from 25mg  back down to 6.25 and only taking it once daily. Educated her that she needs to take twice daily and she will increase back to 25mg . She is also taking her Clopidogrel  and Furosemide  40mg  daily. She says that when her her BP is low she feels light headed and it lasts all day. She eats healthy choice freezer meals three times daily. Her PCP told her that her K was too high (5.5 on 4/29 per chart)-verified again that she is not on spironolactone  and she's been off of it for at least 2 weeks. She wants Dr Alvenia Aus to look over her medications again and tell her if she can stop some of these because "I can't take all of these pills, I just feel bad."

## 2023-06-24 ENCOUNTER — Encounter (HOSPITAL_COMMUNITY)
Admission: RE | Admit: 2023-06-24 | Discharge: 2023-06-24 | Disposition: A | Discharge status: Home / Self Care | Source: Ambulatory Visit | Attending: Cardiovascular Disease

## 2023-06-24 DIAGNOSIS — I70213 Atherosclerosis of native arteries of extremities with intermittent claudication, bilateral legs: Secondary | ICD-10-CM | POA: Diagnosis not present

## 2023-06-24 DIAGNOSIS — I739 Peripheral vascular disease, unspecified: Secondary | ICD-10-CM

## 2023-06-24 LAB — GLUCOSE, CAPILLARY: Glucose-Capillary: 137 mg/dL — ABNORMAL HIGH (ref 70–99)

## 2023-06-24 NOTE — Progress Notes (Signed)
 Daily Session Note  Patient Details  Name: Leah Ferrell MRN: [id number] Date of Birth: [birth year] Referring Provider:   Lorrane Rosette Row Supervised Exercise Therapy from 05/25/2023 in [facility] CARDIAC REHABILITATION  Referring Provider Antionette Kirks MD       Encounter Date: 06/24/2023  Check In:  Session Check In - 06/24/23 1400       Check-In   Supervising physician immediately available to respond to emergencies See telemetry face sheet for immediately available MD    Location [facility]    Staff Present Ronna Coho BSN, RN;Brittany Annette Barters, BSN, RN, WTA-C;Heather Toy Freund, BS, Exercise Physiologist    Virtual Visit No    Medication changes reported     No    Fall or balance concerns reported    No    Tobacco Cessation No Change    Warm-up and Cool-down Performed on first and last piece of equipment    Resistance Training Performed Yes    VAD Patient? No    PAD/SET Patient? Yes      PAD/SET Patient   Completed foot check today? Yes    Open wounds to report? No      Pain Assessment   Currently in Pain? No/denies    Pain Score 0-No pain    Multiple Pain Sites No             Capillary Blood Glucose: No results found. However, due to the size of the patient record, not all encounters were searched. Please check Results Review for a complete set of results.    Social History   Tobacco Use  Smoking Status Former   Current packs/day: 0.00   Average packs/day: 1 pack/day for 36.8 years (36.8 ttl pk-yrs)   Types: Cigarettes   Start date: 04/21/1964   Quit date: 02/17/2001   Years since quitting: 22.3  Smokeless Tobacco Never    Goals Met:  Independence with exercise equipment Exercise tolerated well No report of concerns or symptoms today Strength training completed today  Goals Unmet:  Not Applicable  Comments: Leah AasAaron AasPt able to follow exercise prescription today without complaint.  Will continue to monitor for progression.

## 2023-06-24 NOTE — Telephone Encounter (Signed)
 Her most recent labs showed that she was mildly dehydrated with elevated potassium.  Recommend stopping spironolactone  and lisinopril .  Hold furosemide  for 2 days and then resume.  Continue other medications.  I also recommend repeating basic metabolic profile.

## 2023-06-24 NOTE — Telephone Encounter (Signed)
 Returned the call to the patient. She has been advised to stop the Spironolactone  and Lisinopril . She will hold the Furosemide  for two days and have labs done in [place].

## 2023-06-25 ENCOUNTER — Other Ambulatory Visit

## 2023-06-25 DIAGNOSIS — I150 Renovascular hypertension: Secondary | ICD-10-CM | POA: Diagnosis not present

## 2023-06-25 LAB — BASIC METABOLIC PANEL WITH GFR
BUN/Creatinine Ratio: 18 (ref 12–28)
BUN: 17 mg/dL (ref 8–27)
CO2: 23 mmol/L (ref 20–29)
Calcium: 9.9 mg/dL (ref 8.7–10.3)
Chloride: 97 mmol/L (ref 96–106)
Creatinine, Ser: 0.96 mg/dL (ref 0.57–1.00)
Glucose: 199 mg/dL — ABNORMAL HIGH (ref 70–99)
Potassium: 5.1 mmol/L (ref 3.5–5.2)
Sodium: 135 mmol/L (ref 134–144)
eGFR: 63 mL/min/{1.73_m2} (ref >59)

## 2023-06-26 ENCOUNTER — Encounter (HOSPITAL_COMMUNITY)
Admission: RE | Admit: 2023-06-26 | Discharge: 2023-06-26 | Disposition: A | Discharge status: Home / Self Care | Source: Ambulatory Visit | Attending: Cardiovascular Disease | Admitting: Cardiovascular Disease

## 2023-06-26 DIAGNOSIS — I70213 Atherosclerosis of native arteries of extremities with intermittent claudication, bilateral legs: Secondary | ICD-10-CM | POA: Diagnosis not present

## 2023-06-26 DIAGNOSIS — I739 Peripheral vascular disease, unspecified: Secondary | ICD-10-CM

## 2023-06-26 LAB — GLUCOSE, CAPILLARY: Glucose-Capillary: 86 mg/dL (ref 70–99)

## 2023-06-29 ENCOUNTER — Encounter (HOSPITAL_COMMUNITY): Admission: RE | Admit: 2023-06-29 | Source: Ambulatory Visit

## 2023-06-29 ENCOUNTER — Ambulatory Visit

## 2023-06-29 NOTE — Telephone Encounter (Signed)
 BP stable.

## 2023-07-01 ENCOUNTER — Encounter (HOSPITAL_COMMUNITY)
Admission: RE | Admit: 2023-07-01 | Discharge: 2023-07-01 | Disposition: A | Discharge status: Home / Self Care | Source: Ambulatory Visit | Attending: Cardiovascular Disease

## 2023-07-01 DIAGNOSIS — I739 Peripheral vascular disease, unspecified: Secondary | ICD-10-CM

## 2023-07-01 DIAGNOSIS — I70213 Atherosclerosis of native arteries of extremities with intermittent claudication, bilateral legs: Secondary | ICD-10-CM | POA: Diagnosis not present

## 2023-07-01 LAB — GLUCOSE, CAPILLARY: Glucose-Capillary: 189 mg/dL — ABNORMAL HIGH (ref 70–99)

## 2023-07-01 NOTE — Progress Notes (Signed)
 Daily Session Note  Patient Details  Name: Leah Ferrell MRN: [id number] Date of Birth: 1951/01/29 Referring Provider:   Lorrane Rosette Ferrell Supervised Exercise Therapy from 05/25/2023 in [facility] CARDIAC REHABILITATION  Referring Provider Leah Kirks MD       Encounter Date: 07/01/2023  Check In:  Session Check In - 07/01/23 1400       Check-In   Supervising physician immediately available to respond to emergencies See telemetry face sheet for immediately available MD    Location [facility]    Staff Present Leah Ferrell BSN, RN;Leah Ferrell, [place], Exercise Physiologist;Leah Ferrell, BSN, RN, WTA-C    Virtual Visit No    Medication changes reported     Yes    Comments Pt has been able to resume her Lasix     Fall or balance concerns reported    No    Tobacco Cessation No Change    Warm-up and Cool-down Performed on first and last piece of equipment    Resistance Training Performed Yes    VAD Patient? No    PAD/SET Patient? Yes      PAD/SET Patient   Completed foot check today? Yes    Open wounds to report? No      Pain Assessment   Currently in Pain? No/denies    Pain Score 0-No pain    Multiple Pain Sites No             Capillary Blood Glucose: No results found. However, due to the size of the patient record, not all encounters were searched. Please check Results Review for a complete set of results.    Social History   Tobacco Use  Smoking Status Former   Current packs/day: 0.00   Average packs/day: 1 pack/day for 36.8 years (36.8 ttl pk-yrs)   Types: Cigarettes   Start date: 04/21/1964   Quit date: 02/17/2001   Years since quitting: 22.3  Smokeless Tobacco Never    Goals Met:  Independence with exercise equipment Exercise tolerated well No report of concerns or symptoms today Strength training completed today  Goals Unmet:  Not Applicable  Comments: Leah AasAaron AasPt able to follow exercise prescription today without complaint.  Will  continue to monitor for progression.

## 2023-07-03 ENCOUNTER — Encounter (HOSPITAL_COMMUNITY)
Admission: RE | Admit: 2023-07-03 | Discharge: 2023-07-03 | Disposition: A | Discharge status: Home / Self Care | Source: Ambulatory Visit | Attending: Cardiovascular Disease | Admitting: Cardiovascular Disease

## 2023-07-03 DIAGNOSIS — I70213 Atherosclerosis of native arteries of extremities with intermittent claudication, bilateral legs: Secondary | ICD-10-CM | POA: Diagnosis not present

## 2023-07-03 DIAGNOSIS — I739 Peripheral vascular disease, unspecified: Secondary | ICD-10-CM

## 2023-07-03 LAB — GLUCOSE, CAPILLARY: Glucose-Capillary: 86 mg/dL (ref 70–99)

## 2023-07-03 NOTE — Progress Notes (Signed)
 Daily Session Note  Patient Details  Name: Leah Ferrell MRN: [id number] Date of Birth: 10-28-1950 Referring Provider:   Lorrane Rosette Row Supervised Exercise Therapy from 05/25/2023 in [facility] CARDIAC REHABILITATION  Referring Provider Antionette Kirks MD       Encounter Date: 07/03/2023  Check In:  Session Check In - 07/03/23 1430       Check-In   Supervising physician immediately available to respond to emergencies See telemetry face sheet for immediately available MD    Location [facility]    Staff Present Clotilda Danish, BS, Exercise Physiologist;Brittany Annette Barters, BSN, RN, WTA-C    Virtual Visit No    Medication changes reported     No    Fall or balance concerns reported    No    Tobacco Cessation No Change    Warm-up and Cool-down Performed on first and last piece of equipment    Resistance Training Performed Yes    VAD Patient? No    PAD/SET Patient? Yes      PAD/SET Patient   Completed foot check today? Yes    Open wounds to report? No      Pain Assessment   Currently in Pain? No/denies    Pain Score 0-No pain    Multiple Pain Sites No             Capillary Blood Glucose: No results found. However, due to the size of the patient record, not all encounters were searched. Please check Results Review for a complete set of results.    Social History   Tobacco Use  Smoking Status Former   Current packs/day: 0.00   Average packs/day: 1 pack/day for 36.8 years (36.8 ttl pk-yrs)   Types: Cigarettes   Start date: 04/21/1964   Quit date: 02/17/2001   Years since quitting: 22.3  Smokeless Tobacco Never    Goals Met:  Independence with exercise equipment Exercise tolerated well No report of concerns or symptoms today Strength training completed today  Goals Unmet:  Not Applicable  Comments: Pt able to follow exercise prescription today without complaint.  Will continue to monitor for progression.

## 2023-07-06 ENCOUNTER — Encounter (HOSPITAL_COMMUNITY)
Admission: RE | Admit: 2023-07-06 | Discharge: 2023-07-06 | Disposition: A | Discharge status: Home / Self Care | Source: Ambulatory Visit | Attending: Cardiovascular Disease | Admitting: Cardiovascular Disease

## 2023-07-06 DIAGNOSIS — I739 Peripheral vascular disease, unspecified: Secondary | ICD-10-CM

## 2023-07-06 DIAGNOSIS — I70213 Atherosclerosis of native arteries of extremities with intermittent claudication, bilateral legs: Secondary | ICD-10-CM | POA: Diagnosis not present

## 2023-07-06 LAB — GLUCOSE, CAPILLARY: Glucose-Capillary: 121 mg/dL — ABNORMAL HIGH (ref 70–99)

## 2023-07-06 NOTE — Progress Notes (Signed)
 Daily Session Note  Patient Details  Name: Leah Ferrell MRN: [id number] Date of Birth: 12/03/1950 Referring Provider:   Lorrane Rosette Row Supervised Exercise Therapy from 05/25/2023 in [facility] CARDIAC REHABILITATION  Referring Provider Antionette Kirks MD       Encounter Date: 07/06/2023  Check In:  Session Check In - 07/06/23 1430       Check-In   Supervising physician immediately available to respond to emergencies See telemetry face sheet for immediately available MD    Location [facility]    Staff Present Clotilda Danish, BS, Exercise Physiologist;Brittany Annette Barters, BSN, RN, WTA-C;Baran Kuhrt, RN;Jessica Hobart, MA, RCEP, CCRP, Amador Bad, RN, BSN    Virtual Visit No    Medication changes reported     No    Fall or balance concerns reported    No    Warm-up and Cool-down Performed on first and last piece of equipment    Resistance Training Performed Yes    VAD Patient? No    PAD/SET Patient? Yes      PAD/SET Patient   Completed foot check today? Yes    Open wounds to report? No      Pain Assessment   Currently in Pain? No/denies    Pain Score 0-No pain    Multiple Pain Sites No             Capillary Blood Glucose: Results for orders placed or performed during the hospital encounter of 07/06/23 (from the past 24 hours)  Glucose, capillary     Status: Abnormal   Collection Time: 07/06/23  2:31 PM  Result Value Ref Range   Glucose-Capillary 121 (H) 70 - 99 mg/dL   *Note: Due to a large number of results and/or encounters for the requested time period, some results have not been displayed. A complete set of results can be found in Results Review.      Social History   Tobacco Use  Smoking Status Former   Current packs/day: 0.00   Average packs/day: 1 pack/day for 36.8 years (36.8 ttl pk-yrs)   Types: Cigarettes   Start date: 04/21/1964   Quit date: 02/17/2001   Years since quitting: 22.3  Smokeless Tobacco Never    Goals  Met:  Independence with exercise equipment Exercise tolerated well No report of concerns or symptoms today Strength training completed today  Goals Unmet:  Not Applicable  Comments: Pt able to follow exercise prescription today without complaint.  Will continue to monitor for progression.

## 2023-07-08 ENCOUNTER — Encounter (HOSPITAL_COMMUNITY)
Admission: RE | Admit: 2023-07-08 | Discharge: 2023-07-08 | Disposition: A | Discharge status: Home / Self Care | Source: Ambulatory Visit | Attending: Cardiovascular Disease | Admitting: Cardiovascular Disease

## 2023-07-08 ENCOUNTER — Encounter (HOSPITAL_COMMUNITY): Payer: Self-pay | Admitting: *Deleted

## 2023-07-08 DIAGNOSIS — I739 Peripheral vascular disease, unspecified: Secondary | ICD-10-CM

## 2023-07-08 DIAGNOSIS — I70213 Atherosclerosis of native arteries of extremities with intermittent claudication, bilateral legs: Secondary | ICD-10-CM | POA: Diagnosis not present

## 2023-07-08 NOTE — Progress Notes (Addendum)
 PAD/SET Plan of Care Updates  Patient Details  Name: Leah Ferrell MRN: [id number] Date of Birth: 1950-11-12  Entry Date:  Flowsheet Row Supervised Exercise Therapy from 05/25/2023 in [place] CARDIAC REHABILITATION  Date 05/25/23      Expected Discharge Date:  Flowsheet Row Supervised Exercise Therapy from 05/25/2023 in [place] CARDIAC REHABILITATION  Expected Discharge Date 08/14/23       Medical Diagnosis: PAD (peripheral artery disease) [facility])  Encounter Date: 07/08/23  Exercise Target Goals: Exercise Program Goal: Individual exercise prescription set with THRR, safety & activity barriers. Participant demonstrates ability to understand and report RPE using RPE 6-20 scale, to self-measure pulse accurately, and to acknowledge the importance of the exercise prescription.  Exercise Prescription Goal: Use initial exercise assessment to set exercise prescription to improve time and distance to onset of claudication pain. Provide education to aid in steps toward risk factor modification, improve quality of life with claudication, and utilize THRR and exercise prescription for best results for decreasing symptoms of PAD. Prevent injury to bone, joints, and skin integrity during the exercise through checks of each system during session check in. Following physician guidelines for any contraindications to specific exercises.  Activity Barriers:  Activity Barriers & Cardiac Risk Stratification - 05/25/23 1413       Activity Barriers & Cardiac Risk Stratification   Activity Barriers Deconditioning;Muscular Weakness;Assistive Device;Balance Concerns;Shortness of Breath             Initial Exercise Prescription:  Initial Exercise Prescription - 05/25/23 1600       Date of Initial Exercise RX and Referring Provider   Date 05/25/23    Referring Provider Antionette Kirks MD    Expected Discharge Date 08/14/23      Oxygen   Maintain Oxygen Saturation 88% or higher       Treadmill   MPH 1.7    Grade 0    Minutes 10    METs 2.3      Prescription Details   Frequency (times per week) 3    Duration Progress to 10 minutes continuous walking  at current work load and total walking time to 30-45 min      Intensity   THRR 40-80% of Max Heartrate 89-128    Ratings of Perceived Exertion 11-13    Perceived Dyspnea 0-4      Progression   Progression Continue to follow PAD protocol      Resistance Training   Training Prescription Yes    Weight 3 lb    Reps 10-15             Perform Capillary Blood Glucose checks as needed.  Current Exercise Prescription:   Exercise Prescription Changes     Row Name 05/25/23 1600             Response to Exercise   Blood Pressure (Admit) 112/62       Blood Pressure (Exercise) 138/72       Blood Pressure (Exit) 126/74       Heart Rate (Admit) 49 bpm       Heart Rate (Exercise) 73 bpm       Heart Rate (Exit) 60 bpm       Oxygen Saturation (Admit) 94 %       Oxygen Saturation (Exercise) 96 %       Oxygen Saturation (Exit) 96 %       Rating of Perceived Exertion (Exercise) 17       Perceived Dyspnea (Exercise) 1  Symptoms CPS 4, slightly SOB       Comments treadmill test                Exercise Comments:   Exercise Comments     Row Name 05/27/23 1449           Exercise Comments First full day of exercise!  Patient was oriented to gym and equipment including functions, settings, policies, and procedures.  Patient's individual exercise prescription and treatment plan were reviewed.  All starting workloads were established based on the results of the 6 minute walk test done at initial orientation visit.  The plan for exercise progression was also introduced and progression will be customized based on patient's performance and goals.                Program and Patient Goals Exercise Goals and Review:   Exercise Goals     Row Name 05/25/23 1613             Exercise Goals    Increase Physical Activity Yes       Intervention Provide advice, education, support and counseling about physical activity/exercise needs.;Develop an individualized exercise prescription for aerobic and resistive training based on initial evaluation findings, risk stratification, comorbidities and participant's personal goals.       Expected Outcomes Long Term: Exercising regularly at least 3-5 days a week.;Short Term: Attend rehab on a regular basis to increase amount of physical activity.;Long Term: Add in home exercise to make exercise part of routine and to increase amount of physical activity.       Increase Strength and Stamina Yes       Intervention Provide advice, education, support and counseling about physical activity/exercise needs.;Develop an individualized exercise prescription for aerobic and resistive training based on initial evaluation findings, risk stratification, comorbidities and participant's personal goals.       Expected Outcomes Short Term: Increase workloads from initial exercise prescription for resistance, speed, and METs.;Short Term: Perform resistance training exercises routinely during rehab and add in resistance training at home;Long Term: Improve cardiorespiratory fitness, muscular endurance and strength as measured by increased METs and functional capacity ( )       Able to understand and use rate of perceived exertion (RPE) scale Yes       Intervention Provide education and explanation on how to use RPE scale       Expected Outcomes Short Term: Able to use RPE daily in rehab to express subjective intensity level;Long Term:  Able to use RPE to guide intensity level when exercising independently       Able to understand and use Dyspnea scale Yes       Intervention Provide education and explanation on how to use Dyspnea scale       Expected Outcomes Long Term: Able to use Dyspnea scale to guide intensity level when exercising independently;Short Term: Able to use  Dyspnea scale daily in rehab to express subjective sense of shortness of breath during exertion       Knowledge and understanding of Target Heart Rate Range (THRR) Yes       Intervention Provide education and explanation of THRR including how the numbers were predicted and where they are located for reference       Expected Outcomes Short Term: Able to state/look up THRR;Long Term: Able to use THRR to govern intensity when exercising independently;Short Term: Able to use daily as guideline for intensity in rehab       Able to  check pulse independently Yes       Intervention Provide education and demonstration on how to check pulse in carotid and radial arteries.;Review the importance of being able to check your own pulse for safety during independent exercise       Expected Outcomes Short Term: Able to explain why pulse checking is important during independent exercise;Long Term: Able to check pulse independently and accurately       Understanding of Exercise Prescription Yes       Intervention Provide education, explanation, and written materials on patient's individual exercise prescription       Expected Outcomes Short Term: Able to explain program exercise prescription;Long Term: Able to explain home exercise prescription to exercise independently       Improve claudication pain toleration; Improve walking ability Yes       Intervention Participate in PAD/SET Rehab 2-3 days a week, walking at home as part of exercise prescription;Attend education sessions to aid in risk factor modification and understanding of disease process       Expected Outcomes Short Term: Improve walking distance/time to onset of claudication pain;Long Term: Improve walking ability and toleration to claudication;Long Term: Improve score of PAD questionnaires                Nutrition:  Target Goals: Understanding of nutrition guidelines, daily intake of sodium 1500mg , cholesterol 200mg , calories 30% from fat and 7% or  less from saturated fats, daily to have 5 or more servings of fruits and vegetables.  Biometrics:  Pre Biometrics - 05/25/23 1614       Pre Biometrics   Height 5' 1.25" (1.556 m)    Weight 206 lb (93.4 kg)    Waist Circumference 40 inches    Hip Circumference 46.25 inches    Waist to Hip Ratio 0.86 %    BMI (Calculated) 38.59    Grip Strength 33 kg    Single Leg Stand 3.1 seconds              Nutrition Therapy Plan and Nutrition Goals:  Nutrition Therapy & Goals - 05/25/23 1615       Intervention Plan   Intervention Prescribe, educate and counsel regarding individualized specific dietary modifications aiming towards targeted core components such as weight, hypertension, lipid management, diabetes, heart failure and other comorbidities.;Nutrition handout(s) given to patient.    Expected Outcomes Short Term Goal: Understand basic principles of dietary content, such as calories, fat, sodium, cholesterol and nutrients.;Long Term Goal: Adherence to prescribed nutrition plan.             Core Components/Risk Factors/Patient Goals at Admission:  Personal Goals and Risk Factors at Admission - 05/25/23 1619       Core Components/Risk Factors/Patient Goals on Admission    Weight Management Obesity;Yes;Weight Loss    Intervention Weight Management: Develop a combined nutrition and exercise program designed to reach desired caloric intake, while maintaining appropriate intake of nutrient and fiber, sodium and fats, and appropriate energy expenditure required for the weight goal.;Weight Management: Provide education and appropriate resources to help participant work on and attain dietary goals.;Weight Management/Obesity: Establish reasonable short term and long term weight goals.;Obesity: Provide education and appropriate resources to help participant work on and attain dietary goals.    Admit Weight 206 lb (93.4 kg)    Goal Weight: Short Term 201 lb (91.2 kg)    Goal Weight: Long  Term 196 lb (88.9 kg)    Expected Outcomes Short Term: Continue to assess and  modify interventions until short term weight is achieved;Long Term: Adherence to nutrition and physical activity/exercise program aimed toward attainment of established weight goal;Weight Loss: Understanding of general recommendations for a balanced deficit meal plan, which promotes 1-2 lb weight loss per week and includes a negative energy balance of [phone] kcal/d;Understanding recommendations for meals to include 15-35% energy as protein, 25-35% energy from fat, 35-60% energy from carbohydrates, less than 200mg  of dietary cholesterol, 20-35 gm of total fiber daily;Understanding of distribution of calorie intake throughout the day with the consumption of 4-5 meals/snacks    Improve shortness of breath with ADL's Yes    Intervention Provide education, individualized exercise plan and daily activity instruction to help decrease symptoms of SOB with activities of daily living.    Expected Outcomes Short Term: Improve cardiorespiratory fitness to achieve a reduction of symptoms when performing ADLs;Long Term: Be able to perform more ADLs without symptoms or delay the onset of symptoms    Diabetes Yes    Intervention Provide education about signs/symptoms and action to take for hypo/hyperglycemia.;Provide education about proper nutrition, including hydration, and aerobic/resistive exercise prescription along with prescribed medications to achieve blood glucose in normal ranges: Fasting glucose 65-99 mg/dL    Expected Outcomes Short Term: Participant verbalizes understanding of the signs/symptoms and immediate care of hyper/hypoglycemia, proper foot care and importance of medication, aerobic/resistive exercise and nutrition plan for blood glucose control.;Long Term: Attainment of HbA1C < 7%.    Heart Failure Yes    Intervention Provide a combined exercise and nutrition program that is supplemented with education, support and  counseling about heart failure. Directed toward relieving symptoms such as shortness of breath, decreased exercise tolerance, and extremity edema.    Expected Outcomes Improve functional capacity of life;Short term: Attendance in program 2-3 days a week with increased exercise capacity. Reported lower sodium intake. Reported increased fruit and vegetable intake. Reports medication compliance.;Short term: Daily weights obtained and reported for increase. Utilizing diuretic protocols set by physician.;Long term: Adoption of self-care skills and reduction of barriers for early signs and symptoms recognition and intervention leading to self-care maintenance.    Hypertension Yes    Intervention Provide education on lifestyle modifcations including regular physical activity/exercise, weight management, moderate sodium restriction and increased consumption of fresh fruit, vegetables, and low fat dairy, alcohol  moderation, and smoking cessation.;Monitor prescription use compliance.    Expected Outcomes Short Term: Continued assessment and intervention until BP is < 140/24mm HG in hypertensive participants. < 130/23mm HG in hypertensive participants with diabetes, heart failure or chronic kidney disease.;Long Term: Maintenance of blood pressure at goal levels.    Lipids Yes    Intervention Provide education and support for participant on nutrition & aerobic/resistive exercise along with prescribed medications to achieve LDL 70mg , HDL >40mg .    Expected Outcomes Short Term: Participant states understanding of desired cholesterol values and is compliant with medications prescribed. Participant is following exercise prescription and nutrition guidelines.;Long Term: Cholesterol controlled with medications as prescribed, with individualized exercise RX and with personalized nutrition plan. Value goals: LDL < 70mg , HDL > 40 mg.             ITP Comments:  ITP Comments     Row Name 05/18/23 1034 05/25/23 1601  05/27/23 1448 05/29/23 1500 06/10/23 1338   ITP Comments Virtual orientation visit completed for the PAD program. On-site orientation visit scheduled for 05/20/23 at 1300. Documentation mailed and patient says her daughter helped her complete. Patient attend orientation today.  Patient is attending Supervised  Exercise Therapy for PAD.  Documentation for diagnosis can be found in [facility] 04/28/23.  Reviewed medical chart, RPE/RPD, gym safety, and program guidelines.  Patient was fitted to equipment they will be using during rehab.  Patient is scheduled to start exercise on Wednesday 05/27/23 at 1430.   Initial ITP created and sent for review and signature by Dr. Antionette Kirks, referring physician. First full day of exercise!  Patient was oriented to gym and equipment including functions, settings, policies, and procedures.  Patient's individual exercise prescription and treatment plan were reviewed.  All starting workloads were established based on the results of the 6 minute walk test done at initial orientation visit.  The plan for exercise progression was also introduced and progression will be customized based on patient's performance and goals. [name] did not complete her rehab session.  Patient informed staff that she was in the ED yesterday with her systolic blood pressure 200 and she had dizziness. There were no acute abnormal findings in the ED. She was told to follow up with her PCP. She was not able to get an appointment with them until 4/29. She stated the dizziness had subsided. Her blood pressure was 148/60 which is higher than usual. We informed her that she needed medical clearance before resuming exercise. She verbalized understanding and was going to try and get an earlier appointment with her PCP. I sent a message to Dr. Alvenia Aus to request he review her ED notes and give clearance for her to return due to her not being able to make up missed appointments. He said from his standpoint, she was  okay to return to the program. Patient will be notified. 30 day review completed. Plan of Care Update sent to Dr. Alvenia Aus, referring physician. Continue with Plan of Care unless changes are made by physician. Currently awaiting clearance to return to rehab.    Row Name 07/08/23 1015           ITP Comments 30 day review completed. Plan of Care Update sent to Dr. Alvenia Aus, referring physician. Continue with Plan of Care unless changes are made by physician.                Comments: 30 day review   Physician Documentation: Your signature/cosign is required to indicate approval of the Care Plan as stated above. By signing this report, you are approving the Plan of Care. Please sign and either send electronically or print and fax the signed copy to the number below. Please indicate any changes or limitations in the space provided.   ____________________________________________________________________________________________________________________________________________  Physician Signature: _____________________________________________________ Print Physician Name: ____________________________________________________ Date: ___________________________ Time: _________________________________  [place]  [facility] [address] [place], [place] [address]  Phone: [phone] Fax: [phone]

## 2023-07-08 NOTE — Progress Notes (Signed)
 Daily Session Note  Patient Details  Name: Leah Ferrell MRN: [id number] Date of Birth: 18-Jul-1950 Referring Provider:   Lorrane Rosette Row Supervised Exercise Therapy from 05/25/2023 in [facility] CARDIAC REHABILITATION  Referring Provider Antionette Kirks MD       Encounter Date: 07/08/2023  Check In:  Session Check In - 07/08/23 1405       Check-In   Supervising physician immediately available to respond to emergencies See telemetry face sheet for immediately available MD    Location [facility]    Staff Present Ronna Coho BSN, RN;Heather Toy Freund, BS, Exercise Physiologist;Jessica Windom, MA, RCEP, CCRP, CCET;Brooke Tyrone, RN    Virtual Visit No    Medication changes reported     No    Fall or balance concerns reported    No    Tobacco Cessation No Change    Warm-up and Cool-down Performed on first and last piece of equipment    Resistance Training Performed Yes    VAD Patient? No    PAD/SET Patient? Yes      PAD/SET Patient   Completed foot check today? Yes    Open wounds to report? No      Pain Assessment   Currently in Pain? No/denies    Pain Score 0-No pain    Multiple Pain Sites No             Capillary Blood Glucose: No results found. However, due to the size of the patient record, not all encounters were searched. Please check Results Review for a complete set of results.    Social History   Tobacco Use  Smoking Status Former   Current packs/day: 0.00   Average packs/day: 1 pack/day for 36.8 years (36.8 ttl pk-yrs)   Types: Cigarettes   Start date: 04/21/1964   Quit date: 02/17/2001   Years since quitting: 22.4  Smokeless Tobacco Never    Goals Met:  Independence with exercise equipment Exercise tolerated well No report of concerns or symptoms today Strength training completed today  Goals Unmet:  Not Applicable  Comments: Leah AasAaron AasPt able to follow exercise prescription today without complaint.  Will continue to monitor for  progression.

## 2023-07-10 ENCOUNTER — Encounter (HOSPITAL_COMMUNITY)
Admission: RE | Admit: 2023-07-10 | Discharge: 2023-07-10 | Disposition: A | Discharge status: Home / Self Care | Source: Ambulatory Visit | Attending: Cardiovascular Disease | Admitting: Cardiovascular Disease

## 2023-07-10 DIAGNOSIS — I70213 Atherosclerosis of native arteries of extremities with intermittent claudication, bilateral legs: Secondary | ICD-10-CM | POA: Diagnosis not present

## 2023-07-10 DIAGNOSIS — I739 Peripheral vascular disease, unspecified: Secondary | ICD-10-CM

## 2023-07-10 NOTE — Progress Notes (Signed)
 Daily Session Note  Patient Details  Name: Leah Ferrell MRN: [id number] Date of Birth: 1950/11/12 Referring Provider:   Lorrane Rosette Row Supervised Exercise Therapy from 05/25/2023 in [facility] CARDIAC REHABILITATION  Referring Provider Antionette Kirks MD       Encounter Date: 07/10/2023  Check In:  Session Check In - 07/10/23 1415       Check-In   Supervising physician immediately available to respond to emergencies See telemetry face sheet for immediately available MD    Physician(s) Dr. Alvenia Aus    Location [facility]    Staff Present Doug Gehrig, RN, BSN;Heather Toy Freund, BS, Exercise Physiologist;Brittany Foley, BSN, RN    Virtual Visit No    Medication changes reported     No    Fall or balance concerns reported    No    Warm-up and Cool-down Performed on first and last piece of equipment    Resistance Training Performed Yes    VAD Patient? No    PAD/SET Patient? Yes      PAD/SET Patient   Completed foot check today? Yes    Open wounds to report? No      Pain Assessment   Currently in Pain? No/denies    Pain Score 0-No pain    Multiple Pain Sites No             Capillary Blood Glucose: No results found. However, due to the size of the patient record, not all encounters were searched. Please check Results Review for a complete set of results.    Social History   Tobacco Use  Smoking Status Former   Current packs/day: 0.00   Average packs/day: 1 pack/day for 36.8 years (36.8 ttl pk-yrs)   Types: Cigarettes   Start date: 04/21/1964   Quit date: 02/17/2001   Years since quitting: 22.4  Smokeless Tobacco Never    Goals Met:  Independence with exercise equipment Exercise tolerated well No report of concerns or symptoms today Strength training completed today  Goals Unmet:  Not Applicable  Comments: Pt able to follow exercise prescription today without complaint.  Will continue to monitor for progression.

## 2023-07-13 ENCOUNTER — Encounter (HOSPITAL_COMMUNITY)

## 2023-07-15 ENCOUNTER — Ambulatory Visit
Admission: RE | Admit: 2023-07-15 | Discharge: 2023-07-15 | Disposition: A | Discharge status: Home / Self Care | Source: Ambulatory Visit | Attending: Family | Admitting: Family

## 2023-07-15 ENCOUNTER — Encounter (HOSPITAL_COMMUNITY)
Admission: RE | Admit: 2023-07-15 | Discharge: 2023-07-15 | Disposition: A | Discharge status: Home / Self Care | Source: Ambulatory Visit | Attending: Cardiovascular Disease

## 2023-07-15 DIAGNOSIS — I739 Peripheral vascular disease, unspecified: Secondary | ICD-10-CM

## 2023-07-15 DIAGNOSIS — I70213 Atherosclerosis of native arteries of extremities with intermittent claudication, bilateral legs: Secondary | ICD-10-CM | POA: Diagnosis not present

## 2023-07-15 DIAGNOSIS — Z1231 Encounter for screening mammogram for malignant neoplasm of breast: Secondary | ICD-10-CM | POA: Diagnosis not present

## 2023-07-15 NOTE — Progress Notes (Signed)
 Daily Session Note  Patient Details  Name: Leah Ferrell MRN: [id number] Date of Birth: 22-Sep-1950 Referring Provider:   Lorrane Rosette Row Supervised Exercise Therapy from 05/25/2023 in [facility] CARDIAC REHABILITATION  Referring Provider Antionette Kirks MD       Encounter Date: 07/15/2023  Check In:  Session Check In - 07/15/23 1400       Check-In   Supervising physician immediately available to respond to emergencies See telemetry face sheet for immediately available MD    Location [facility]    Staff Present Doug Gehrig, RN, BSN;Heather Toy Freund, BS, Exercise Physiologist;Brittany Foley, BSN, RN    Virtual Visit No    Medication changes reported     No    Fall or balance concerns reported    No    Warm-up and Cool-down Performed on first and last piece of equipment    Resistance Training Performed Yes    VAD Patient? No    PAD/SET Patient? Yes      PAD/SET Patient   Completed foot check today? Yes    Open wounds to report? No      Pain Assessment   Currently in Pain? No/denies    Pain Score 0-No pain    Multiple Pain Sites No             Capillary Blood Glucose: No results found. However, due to the size of the patient record, not all encounters were searched. Please check Results Review for a complete set of results.    Social History   Tobacco Use  Smoking Status Former   Current packs/day: 0.00   Average packs/day: 1 pack/day for 36.8 years (36.8 ttl pk-yrs)   Types: Cigarettes   Start date: 04/21/1964   Quit date: 02/17/2001   Years since quitting: 22.4  Smokeless Tobacco Never    Goals Met:  Independence with exercise equipment Exercise tolerated well No report of concerns or symptoms today Strength training completed today  Goals Unmet:  Not Applicable  Comments: Pt able to follow exercise prescription today without complaint.  Will continue to monitor for progression.

## 2023-07-16 ENCOUNTER — Telehealth: Payer: Self-pay | Admitting: Family

## 2023-07-16 DIAGNOSIS — I739 Peripheral vascular disease, unspecified: Secondary | ICD-10-CM

## 2023-07-16 DIAGNOSIS — I5032 Chronic diastolic (congestive) heart failure: Secondary | ICD-10-CM

## 2023-07-16 DIAGNOSIS — R531 Weakness: Secondary | ICD-10-CM

## 2023-07-16 NOTE — Telephone Encounter (Signed)
 Orders up front for patient to pick up

## 2023-07-16 NOTE — Telephone Encounter (Signed)
 Please fax orders to her pharmacy.

## 2023-07-16 NOTE — Telephone Encounter (Signed)
 Patient aware.

## 2023-07-16 NOTE — Telephone Encounter (Signed)
 Pended please reorder

## 2023-07-16 NOTE — Telephone Encounter (Signed)
 Copied from CRM [id number]. Topic: General - Other >> Jul 16, 2023  9:50 AM Turkey B wrote: Reason for CRM: pt called in, states Checking status of shower chair and thermometer, she says [employer] orders. She also asked if she can pick up Ozempic  today. Please cb

## 2023-07-17 ENCOUNTER — Telehealth: Payer: Self-pay | Admitting: Cardiovascular Disease

## 2023-07-17 ENCOUNTER — Encounter (HOSPITAL_COMMUNITY)

## 2023-07-17 NOTE — Telephone Encounter (Signed)
 Pt c/o swelling/edema: STAT if pt has developed SOB within 24 hours  If swelling, where is the swelling located? Hands and face   How much weight have you gained and in what time span? N/A  Have you gained 2 pounds in a day or 5 pounds in a week? N/A  Do you have a log of your daily weights (if so, list)?   Are you currently taking a fluid pill? Yes   Are you currently SOB? No  Have you traveled recently in a car or plane for an extended period of time?

## 2023-07-17 NOTE — Telephone Encounter (Signed)
 Spoke with pt who reports edema of hands and face x 4 days.  Mild LEE.  PT reports weight had increased x 6 pounds as of yesterday but she is down 2 pounds today.  Current weight 194.  BP 145/? HR - 54 and O2 sat - 94.  She is taking Furosemide  40mg  daily and following a low sodium diet.  Denies current CP, SOB or dizziness. Pt advised will forward to Dr Alvenia Aus for further review and recommendation.  Pt verbalizes understanding and agrees with current plan.   Pt states she is on her way to pick up something from her PCP office and if we return call do not leave a message as pt has a new phone.

## 2023-07-17 NOTE — Telephone Encounter (Signed)
 Patient has been made aware. Her swelling has been going down. She has been educated on how to watch for salt in foods, especially hidden sodium.

## 2023-07-17 NOTE — Telephone Encounter (Signed)
 Edema in the hands and face is unlikely to be related to congestive heart failure.  I suggest that she continues with furosemide  40 mg once daily.

## 2023-07-20 ENCOUNTER — Encounter (HOSPITAL_COMMUNITY)

## 2023-07-21 ENCOUNTER — Telehealth: Payer: Self-pay | Admitting: Pharmacist

## 2023-07-21 NOTE — Telephone Encounter (Signed)
   Patient enrolled in the [employer] patient assistance program for Tresiba /novolog /ozempic .    Patient is out of Ozempic .  Can we check on the status of ozemipic 2mg ?  Patient given 0.5mg  sample to bridge supply  Tresiba  and Novolog  have arrived to practice.  Patient notified.  She cannot ready English, but knows the colors of her boxes and what/how much she uses.  Dennice Tindol Dattero Jameson Morrow, PharmD, BCACP, CPP Clinical Pharmacist, [facility]

## 2023-07-22 ENCOUNTER — Encounter (HOSPITAL_COMMUNITY)
Admission: RE | Admit: 2023-07-22 | Discharge: 2023-07-22 | Disposition: A | Discharge status: Home / Self Care | Source: Ambulatory Visit | Attending: Cardiovascular Disease | Admitting: Cardiovascular Disease

## 2023-07-22 ENCOUNTER — Telehealth: Payer: Self-pay

## 2023-07-22 DIAGNOSIS — Z955 Presence of coronary angioplasty implant and graft: Secondary | ICD-10-CM | POA: Diagnosis not present

## 2023-07-22 DIAGNOSIS — I70213 Atherosclerosis of native arteries of extremities with intermittent claudication, bilateral legs: Secondary | ICD-10-CM | POA: Insufficient documentation

## 2023-07-22 DIAGNOSIS — I739 Peripheral vascular disease, unspecified: Secondary | ICD-10-CM | POA: Insufficient documentation

## 2023-07-22 LAB — GLUCOSE, CAPILLARY: Glucose-Capillary: 183 mg/dL — ABNORMAL HIGH (ref 70–99)

## 2023-07-22 NOTE — Telephone Encounter (Signed)
 Refill/ new med form emailed to [facility].   Refills for Tresiba , Novolog , and adding back Ozempic  to current enrollment.

## 2023-07-22 NOTE — Progress Notes (Signed)
 Daily Session Note  Patient Details  Name: Leah Ferrell MRN: [id number] Date of Birth: [birth year] Referring Provider:   Lorrane Rosette Row Supervised Exercise Therapy from 05/25/2023 in [facility] CARDIAC REHABILITATION  Referring Provider Antionette Kirks MD       Encounter Date: 07/22/2023  Check In:  Session Check In - 07/22/23 1400       Check-In   Supervising physician immediately available to respond to emergencies See telemetry face sheet for immediately available MD    Location [facility]    Staff Present Ronna Coho BSN, RN;Heather Toy Freund, [place], Exercise Physiologist;Brittany Annette Barters, BSN, RN, Tisha Forget, RN, BSN    Virtual Visit No    Medication changes reported     No    Fall or balance concerns reported    No    Warm-up and Cool-down Performed on first and last piece of equipment    Resistance Training Performed Yes    VAD Patient? No    PAD/SET Patient? Yes      PAD/SET Patient   Completed foot check today? Yes    Open wounds to report? No      Pain Assessment   Currently in Pain? No/denies    Pain Score 0-No pain    Multiple Pain Sites No             Capillary Blood Glucose: No results found. However, due to the size of the patient record, not all encounters were searched. Please check Results Review for a complete set of results.    Social History   Tobacco Use  Smoking Status Former   Current packs/day: 0.00   Average packs/day: 1 pack/day for 36.8 years (36.8 ttl pk-yrs)   Types: Cigarettes   Start date: 04/21/1964   Quit date: 02/17/2001   Years since quitting: 22.4  Smokeless Tobacco Never    Goals Met:  Independence with exercise equipment Exercise tolerated well No report of concerns or symptoms today Strength training completed today  Goals Unmet:  Not Applicable  Comments: .Pt able to follow exercise prescription today without complaint.  Will continue to monitor for progression.[name]

## 2023-07-24 ENCOUNTER — Encounter (HOSPITAL_COMMUNITY)
Admission: RE | Admit: 2023-07-24 | Discharge: 2023-07-24 | Disposition: A | Discharge status: Home / Self Care | Source: Ambulatory Visit | Attending: Cardiovascular Disease | Admitting: Cardiovascular Disease

## 2023-07-24 DIAGNOSIS — Z955 Presence of coronary angioplasty implant and graft: Secondary | ICD-10-CM | POA: Diagnosis not present

## 2023-07-24 DIAGNOSIS — I739 Peripheral vascular disease, unspecified: Secondary | ICD-10-CM

## 2023-07-24 DIAGNOSIS — I70213 Atherosclerosis of native arteries of extremities with intermittent claudication, bilateral legs: Secondary | ICD-10-CM | POA: Diagnosis not present

## 2023-07-24 NOTE — Progress Notes (Signed)
 Daily Session Note  Patient Details  Name: Leah Ferrell MRN: [id number] Date of Birth: March 31, 1950 Referring Provider:   Lorrane Rosette Row Supervised Exercise Therapy from 05/25/2023 in [facility] CARDIAC REHABILITATION  Referring Provider Antionette Kirks MD       Encounter Date: 07/24/2023  Check In:  Session Check In - 07/24/23 1410       Check-In   Supervising physician immediately available to respond to emergencies See telemetry face sheet for immediately available MD    Physician(s) Dr. Alvenia Aus    Location [facility]    Staff Present Doug Gehrig, RN, BSN;Brittany Annette Barters, BSN, RN, WTA-C;Heather Toy Freund, BS, Exercise Physiologist    Virtual Visit No    Medication changes reported     No    Fall or balance concerns reported    No    Warm-up and Cool-down Performed on first and last piece of equipment    Resistance Training Performed Yes    VAD Patient? No    PAD/SET Patient? Yes      PAD/SET Patient   Completed foot check today? Yes    Open wounds to report? No      Pain Assessment   Currently in Pain? No/denies    Pain Score 0-No pain    Multiple Pain Sites No             Capillary Blood Glucose: No results found. However, due to the size of the patient record, not all encounters were searched. Please check Results Review for a complete set of results.    Social History   Tobacco Use  Smoking Status Former   Current packs/day: 0.00   Average packs/day: 1 pack/day for 36.8 years (36.8 ttl pk-yrs)   Types: Cigarettes   Start date: 04/21/1964   Quit date: 02/17/2001   Years since quitting: 22.4  Smokeless Tobacco Never    Goals Met:  Independence with exercise equipment Exercise tolerated well No report of concerns or symptoms today Strength training completed today  Goals Unmet:  Not Applicable  Comments: Pt able to follow exercise prescription today without complaint.  Will continue to monitor for progression.

## 2023-07-27 ENCOUNTER — Encounter (HOSPITAL_COMMUNITY)
Admission: RE | Admit: 2023-07-27 | Discharge: 2023-07-27 | Disposition: A | Discharge status: Home / Self Care | Source: Ambulatory Visit | Attending: Cardiovascular Disease | Admitting: Cardiovascular Disease

## 2023-07-27 DIAGNOSIS — Z955 Presence of coronary angioplasty implant and graft: Secondary | ICD-10-CM | POA: Diagnosis not present

## 2023-07-27 DIAGNOSIS — I739 Peripheral vascular disease, unspecified: Secondary | ICD-10-CM

## 2023-07-27 DIAGNOSIS — I70213 Atherosclerosis of native arteries of extremities with intermittent claudication, bilateral legs: Secondary | ICD-10-CM | POA: Diagnosis not present

## 2023-07-27 NOTE — Progress Notes (Signed)
 Daily Session Note  Patient Details  Name: Leah Ferrell MRN: [id number] Date of Birth: 02-07-1951 Referring Provider:   Lorrane Rosette Row Supervised Exercise Therapy from 05/25/2023 in [facility] CARDIAC REHABILITATION  Referring Provider Antionette Kirks MD       Encounter Date: 07/27/2023  Check In:  Session Check In - 07/27/23 1430       Check-In   Supervising physician immediately available to respond to emergencies See telemetry face sheet for immediately available MD    Staff Present Jerrol Morelle, BSN, RN, WTA-C;Phyllis Billingsley, RN;Debra Johnson, RN, BSN    Virtual Visit No    Medication changes reported     No    Fall or balance concerns reported    No    Tobacco Cessation No Change    Warm-up and Cool-down Performed on first and last piece of equipment    Resistance Training Performed Yes    VAD Patient? No    PAD/SET Patient? Yes      PAD/SET Patient   Completed foot check today? Yes    Open wounds to report? No      Pain Assessment   Currently in Pain? No/denies             Capillary Blood Glucose: No results found. However, due to the size of the patient record, not all encounters were searched. Please check Results Review for a complete set of results.    Social History   Tobacco Use  Smoking Status Former   Current packs/day: 0.00   Average packs/day: 1 pack/day for 36.8 years (36.8 ttl pk-yrs)   Types: Cigarettes   Start date: 04/21/1964   Quit date: 02/17/2001   Years since quitting: 22.4  Smokeless Tobacco Never    Goals Met:  Proper associated with RPD/PD & O2 Sat Independence with exercise equipment Improved SOB with ADL's Using PLB without cueing & demonstrates good technique Exercise tolerated well No report of concerns or symptoms today Strength training completed today  Goals Unmet:  Not Applicable  Comments: Pt able to follow exercise prescription today without complaint.  Will continue to monitor for progression.

## 2023-07-28 ENCOUNTER — Ambulatory Visit: Attending: Cardiovascular Disease | Admitting: Cardiovascular Disease

## 2023-07-28 ENCOUNTER — Encounter: Payer: Self-pay | Admitting: Cardiovascular Disease

## 2023-07-28 ENCOUNTER — Ambulatory Visit: Admitting: Cardiovascular Disease

## 2023-07-28 VITALS — BP 146/60 | HR 53 | Ht 62.0 in | Wt 197.4 lb

## 2023-07-28 DIAGNOSIS — I25708 Atherosclerosis of coronary artery bypass graft(s), unspecified, with other forms of angina pectoris: Secondary | ICD-10-CM | POA: Diagnosis not present

## 2023-07-28 DIAGNOSIS — I739 Peripheral vascular disease, unspecified: Secondary | ICD-10-CM | POA: Diagnosis not present

## 2023-07-28 DIAGNOSIS — I5032 Chronic diastolic (congestive) heart failure: Secondary | ICD-10-CM | POA: Diagnosis not present

## 2023-07-28 DIAGNOSIS — E785 Hyperlipidemia, unspecified: Secondary | ICD-10-CM | POA: Diagnosis not present

## 2023-07-28 DIAGNOSIS — I150 Renovascular hypertension: Secondary | ICD-10-CM | POA: Diagnosis not present

## 2023-07-28 NOTE — Patient Instructions (Signed)
 Medication Instructions:  No changes *If you need a refill on your cardiac medications before your next appointment, please call your pharmacy*  Lab Work: None ordered If you have labs (blood work) drawn today and your tests are completely normal, you will receive your results only by: MyChart Message (if you have MyChart) OR A paper copy in the mail If you have any lab test that is abnormal or we need to change your treatment, we will call you to review the results.  Testing/Procedures: None ordered  Follow-Up: At [facility], you and your health needs are our priority.  As part of our continuing mission to provide you with exceptional heart care, our providers are all part of one team.  This team includes your primary Cardiologist (physician) and Advanced Practice Providers or APPs (Physician Assistants and Nurse Practitioners) who all work together to provide you with the care you need, when you need it.  Your next appointment:   4 month(s)  Provider:   Antionette Kirks, MD    We recommend signing up for the patient portal called "MyChart".  Sign up information is provided on this After Visit Summary.  MyChart is used to connect with patients for Virtual Visits (Telemedicine).  Patients are able to view lab/test results, encounter notes, upcoming appointments, etc.  Non-urgent messages can be sent to your provider as well.   To learn more about what you can do with MyChart, go to [email or web address].

## 2023-07-28 NOTE — Progress Notes (Signed)
 Cardiology Office Note   Date:  07/28/2023   ID:  Leah Ferrell, Leah Ferrell 07/08/1950, MRN [id number]  PCP:  Yevette Hem, FNP  Cardiologist: Dr. Alvenia Aus   No chief complaint on file.    History of Present Illness: Leah Ferrell is a 73 y.o. female who presents for a followup visit regarding peripheral arterial disease and coronary artery disease.  She has known history of coronary artery disease status post drug-eluting stent placement to the LAD X 2 with subsequent CABG in May 2022. She is known to have peripheral arterial disease status post left common iliac artery stent placement followed by left common femoral artery resection and bypass from left external iliac to the left profunda done by Dr. Timm Foot in 2015. She is known to have chronically occluded bilateral SFAs being managed medically.  She had worsening left leg claudication in 2018 with elevated velocity at the proximal anastomosis of the graft.   Angiography in March 2018 showed mild to moderate calcified common iliac artery disease with patent stent in the left common iliac artery. There was severe stenosis in the left common femoral artery at the proximal anastomosis of the bypass to the profunda. I performed successful angioplasty and drug-coated balloon angioplasty.   She is status post bilateral renal artery stenting in January 2021 due to refractory hypertension and recurrent heart failure.  She had significant improvement in blood pressure control as well as heart failure symptoms since then.    Lower extremity angiography in March of 2022 showed patent renal artery stents with significant restenosis on the right side, severe bilateral heavily calcified ostial common iliac artery disease extending into the distal aorta.  The iliac arteries were treated by successful kissing stent placement.  On the right side, there was moderate common femoral artery stenosis as well as chronically occluded SFA with reconstitution  distally via collaterals from the profunda, short occlusion of above-the-knee popliteal artery with collaterals and three-vessel runoff below the knee.  On the left side, there was flush occlusion of the SFA with collaterals from the profunda and two-vessel runoff below the knee. She underwent CABG in May, 2022 with LIMA to LAD, SVG to OM and SVG to diagonal.  Most recent ABI was done in October 2024 and was moderately to severely reduced bilaterally at 0.49.  She was hospitalized in February of this year with non-STEMI in the setting of severely elevated blood pressure.  Echocardiogram showed an EF of 50 to 55%.  Cardiac catheterization was done via the left radial artery and showed heavily calcified coronary arteries with severe underlying three-vessel coronary artery disease with patent grafts including LIMA to LAD, SVG to diagonal and SVG to OM 3.  There was severe progression of native coronary artery disease including the right coronary artery which was not bypassed.  I performed difficult but successful PCI and 3 overlapped drug-eluting stent placement to the right coronary artery.  Renal angiography showed widely patent left renal artery stent with no significant restenosis.  Right renal artery stent was patent but with severe in-stent restenosis proximally.  She was hospitalized at [facility] in April with acute on chronic diastolic heart failure.  She was diuresed and improved.  Subsequently, she developed hypotension.  We did labs which showed evidence of volume depletion with hyperkalemia.  I asked her to stop taking spironolactone  and lisinopril  as a result.  She has been attending vascular rehab with significant improvement in symptoms.  She is now able to  walk 30 minutes without having to stop.  She does complain of worsening lower extremity edema at the end of the day.  No chest pain.   Past Medical History:  Diagnosis Date   CAD (coronary artery disease)    a. 04/2012 NSTEMI:  s/p DES to LAD.   Chronic diastolic CHF (congestive heart failure) (HCC)    Diabetes mellitus without complication (HCC)    Dysrhythmia    Environmental and seasonal allergies    Hyperlipidemia    Hypertension    Leukocytosis    Morbid obesity (HCC)    NSTEMI (non-ST elevated myocardial infarction) (HCC) 04/27/2012   PONV (postoperative nausea and vomiting)    PVD (peripheral vascular disease) (HCC)    a. 04/2012 ABI: R 0.49, L 0.39. Angiography 06/14: Significant ostial left common iliac artery stenosis extending into the distal aorta, severe left common femoral artery stenosis, bilateral SFA occlusion with heavy calcifications. Functionally, one-vessel runoff bilaterally below the knee   Renal artery stenosis (HCC)    s/p angioplasty/stenting bilateral renal arteries 03/16/19   Shortness of breath     Past Surgical History:  Procedure Laterality Date   ABDOMINAL AORTAGRAM N/A 07/21/2012   Procedure: ABDOMINAL Tommi Fraise;  Surgeon: Wenona Hamilton, MD;  Location: [facility] CATH LAB;  Service: Cardiovascular;  Laterality: N/A;   ABDOMINAL AORTOGRAM W/LOWER EXTREMITY N/A 05/09/2020   Procedure: ABDOMINAL AORTOGRAM W/LOWER EXTREMITY;  Surgeon: Wenona Hamilton, MD;  Location: [facility] INVASIVE CV LAB;  Service: Cardiovascular;  Laterality: N/A;   CARDIAC CATHETERIZATION     stents X 2   CATARACT EXTRACTION W/PHACO Left 09/08/2022   Procedure: CATARACT EXTRACTION PHACO AND INTRAOCULAR LENS PLACEMENT (IOC) with placement of Corticosteroid;  Surgeon: Tarri Farm, MD;  Location: [facility] ORS;  Service: Ophthalmology;  Laterality: Left;  CDE 8.48   CORONARY ANGIOGRAM  04/27/2012   Procedure: CORONARY ANGIOGRAM;  Surgeon: Lake Pilgrim, MD;  Location: [facility] CATH LAB;  Service: Cardiovascular;;   CORONARY ARTERY BYPASS GRAFT N/A 06/25/2020   Procedure: CORONARY ARTERY BYPASS GRAFTING (CABG)X3. LEFT INTERNAL MAMMARY ARTERY. RIGHT ENDOSCOPIC SAPHENOUS VEIN HARVESTING.;  Surgeon: Rudine Cos, MD;  Location: [facility] OR;   Service: Open Heart Surgery;  Laterality: N/A;   CORONARY STENT INTERVENTION N/A 04/08/2023   Procedure: CORONARY STENT INTERVENTION;  Surgeon: Wenona Hamilton, MD;  [facility]: [facility] INVAS[facility] CV LAB;  Service: Cardiovascular;  Laterality: N/A;  RCA   ENDARTERECTOMY FEMORAL Left 11/02/2013   Procedure: RESECTION LEFT COMMON FEMORAL ARTERY AND INSERTION OF INTERPOSITION 7mm HEMASHIELD GRAFT FROM LEFT EXTERNAL  ILIAC ARTERY TO LEFT PROFUNDA  FEMORIS ARTERY;  Surgeon: Palma Bob, MD;  Location[facility] OR;  Service: Vascular;  Laterality: Left;   EYE SURGERY Right 2017   FRACTURE SURGERY Right Jul 07, 2014   Right Foot-  [place]  [facility]  by Heat duct   ILIAC ARTERY STENT Left 08/31/2013   LAD stent     LEFT HEART CATH AND CORONARY ANGIOGRAPHY N/A 05/09/2020   Procedure: LEFT HEART CATH AND CORONARY ANGIOGRAPHY;  Surgeon: Wenona Hamilton, MD;  [facility]: [facility] IN[facility];  Service: Cardiovascular;  Laterality: N/A;   LEFT HEART CATH AND CORS/GRAFTS ANGIOGRAPHY N/A 04/08/2023   Procedure: LEFT HEART CATH AND CORS/GRAFTS ANGIOGRAPHY;  Surgeon: Wenona Hamilton, MD;  [facility]: [facility];  Service: Cardiovascular;  Laterality: N/A;   LEFT HEART CATHETERIZATION WITH CORONARY ANGIOGRAM N/A 04/23/2012   Procedure: LEFT HEART CATHETERIZATION WITH CORONARY ANGIOGRAM;  Surgeon: Peter M Swaziland, MD;  Location:  [facility] CATH [facility];  Service: Cardiovascular;  Laterality: N/A;   LOWER EXTREMITY ANGIOGRAM Left 08/31/2013   Procedure: LOWER EXTREMITY ANGIOGRAM;  Surgeon: Wenona Hamilton, MD;  [facility]: [facility] CATH [facility];  Service: Cardiovascular;  Laterality: Left;   PERCUTANEOUS STENT INTERVENTION Left 08/31/2013   Procedure: PERCUTANEOUS STENT INTERVENTION;  Surgeon: Wenona Hamilton, MD;  [facility]: [facility] CATH [facility];  Service: Cardiovascular;  Laterality: Left;  Common Iliac artery   PERIPHERAL VASCULAR CATHETERIZATION N/A 03/19/2016   Procedure: Abdominal Aortogram w/Lower Extremity;  Surgeon: Wenona Hamilton, MD;  [facility]: [facility]  INVASIVE CV LAB;  Service: Cardiovascular;  Laterality: N/A;   PERIPHERAL VASCULAR CATHETERIZATION  03/19/2016   Procedure: Peripheral Vascular Balloon Angioplasty-CFA Left;  Surgeon: Wenona Hamilton, MD;  [facility]: [facility];  Service: Cardiovascular;;   PERIPHERAL VASCULAR INTERVENTION Bilateral 03/16/2019   Procedure: PERIPHERAL VASCULAR INTERVENTION;  Surgeon: Wenona Hamilton, MD;  [facility]: [facility];  Service: Cardiovascular;  Laterality: Bilateral;  RENAL   PERIPHERAL VASCULAR INTERVENTION Bilateral 05/09/2020   Procedure: PERIPHERAL VASCULAR INTERVENTION;  Surgeon: Wenona Hamilton, MD;  [facility]: [facility] CV LAB;  Service: Cardiovascular;  Laterality: Bilateral;  Iliac   RENAL ANGIOGRAPHY  03/16/2019   RENAL ANGIOGRAPHY Bilateral 03/16/2019   Procedure: RENAL ANGIOGRAPHY;  Surgeon: Wenona Hamilton, MD;  [facility]: [facility] CV [facility];  Service: Cardiovascular;  Laterality: Bilateral;   RENAL ANGIOGRAPHY Bilateral 04/08/2023   Procedure: RENAL ANGIOGRAPHY;  Surgeon: Wenona Hamilton, MD;  [facility]: [facility] CV [facility];  Service: Cardiovascular;  Laterality: Bilateral;   TEE WITHOUT CARDIOVERSION N/A 06/25/2020   Procedure: TRANSESOPHAGEAL ECHOCARDIOGRAM (TEE);  Surgeon: Rudine Cos, MD;  Location: [facility] OR;  Service: Open Heart Surgery;  Laterality: N/A;   TUMOR REMOVAL     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     albuterol  (VENTOLIN  HFA) 108 (90 Base) MCG/ACT inhaler Inhale 2 puffs into the lungs every 6 (six) hours as needed for shortness of breath or wheezing. 8 g 2   amLODipine  (NORVASC ) 10 MG tablet Take 1 tablet (10 mg total) by mouth daily. 30 tablet 5   Ascorbic Acid  (VITAMIN C) 1000 MG tablet Take 1,000 mg by mouth 3 (three) times daily.       aspirin  EC 81 MG tablet Take 1 tablet (81 mg total) by mouth daily with breakfast. In the morning 30 tablet 11   BD ULTRA-FINE PEN NEEDLES 29G X 12.7MM MISC USE PENNEEDLES 3 TIMES  DAILY AS DIRECTED 270 each 1   blood glucose meter kit and supplies Dispense based on patient and insurance preference. Use up to four times daily as directed. (FOR ICD-10 E10.9, E11.9). 1 each 0   Calcium  Carbonate-Vit D-Min (CALTRATE 600+D PLUS MINERALS) 600-800 MG-UNIT TABS Take 1 tablet by mouth 3 (three) times daily.      carvedilol  (COREG ) 25 MG tablet Take 1 tablet (25 mg total) by mouth 2 (two) times daily with a meal. 180 tablet 2   Chlorpheniramine-Acetaminophen  (CORICIDIN HBP COLD/FLU PO) Take 1 tablet by mouth daily as needed (cough/cold).     Cholecalciferol  (VITAMIN D -3) 125 MCG (5000 UT) TABS Take 5,000 Units by mouth daily.     clopidogrel  (PLAVIX ) 75 MG tablet Take 1 tablet (75 mg total) by  mouth daily. 90 tablet 3   fish oil-omega-3 fatty acids  1000 MG capsule Take 1 g by mouth daily.      furosemide  (LASIX ) 40 MG tablet Take 1 tablet (40 mg total) by mouth daily. 30 tablet 11   glucose blood (GLUCOSE METER TEST) test strip Use 6-10 times a day, Uses ReliOn 900 each 12   hydroxypropyl methylcellulose (ISOPTO TEARS) 2.5 % ophthalmic solution Place 1 drop into both eyes 3 (three) times daily as needed (for dry eyes).     insulin  degludec (TRESIBA  FLEXTOUCH) 100 UNIT/ML FlexTouch Pen Inject 15-30 Units into the skin daily. (Patient taking differently: Inject 20 Units into the skin at bedtime.) 15 mL 0   insulin  lispro (HUMALOG  KWIKPEN) 200 UNIT/ML KwikPen Inject 50 Units into the skin 3 (three) times daily before meals. (Patient taking differently: Inject 20-30 Units into the skin 3 (three) times daily before meals.) 45 mL 11   Insulin  Syringe 27G X 1/2" 0.5 ML MISC Use with Fiasp  insulin  TID 100 each 3   montelukast  (SINGULAIR ) 10 MG tablet Take 1 tablet by mouth once daily with breakfast 90 tablet 1    nitroGLYCERIN  (NITROSTAT ) 0.4 MG SL tablet Place 1 tablet (0.4 mg total) under the tongue every 5 (five) minutes as needed for chest pain. 10 tablet 3   oxybutynin  (DITROPAN -XL) 5 MG 24 hr tablet Take 1 tablet (5 mg total) by mouth at bedtime. (Patient taking differently: Take 5 mg by mouth daily.) 90 tablet 1   pantoprazole  (PROTONIX ) 40 MG tablet Take 1 tablet by mouth once daily 90 tablet 0   polyethylene glycol (MIRALAX  / GLYCOLAX ) 17 g packet Take 17 g by mouth daily.     RELION PEN NEEDLES 29G X MISC USE 1 NEEDLE TO INJECT INSULIN  SUBCUTANEOUSLY 3 TIMES DAILY (Patient taking differently: Inject 1 Device into the skin 3 (three) times daily.) 50 each 5   rosuvastatin  (CRESTOR ) 40 MG tablet Take 1 tablet by mouth once daily 90 tablet 0   Semaglutide , 2 MG/DOSE, (OZEMPIC , 2 MG/DOSE,) 8 MG/3ML SOPN Inject 2 mg into the skin once a week. (Patient taking differently: Inject 2 mg into the skin every Saturday.) 3 mL 0   No current facility-administered medications for this visit.    Allergies:   Tape    Social History:  The patient  reports that she quit smoking about 22 years ago. Her smoking use included cigarettes. She started smoking about 59 years ago. She has a 36.8 pack-year smoking history. She has never used smokeless tobacco. She reports that she does not drink alcohol  and does not use drugs.   Family History:  The patient's family history includes Alcohol  abuse in her brother; Diabetes in her brother, daughter, maternal grandmother, mother, and son; Heart attack in her maternal grandfather; Heart disease in her brother; Hyperlipidemia in her mother and son; Hypertension in her daughter, mother, and son; Other in her sister and sister; Peripheral vascular disease in her mother.    ROS:  Please see the history of present illness.   Otherwise, review of systems are positive for none.   All other systems are reviewed and negative.    PHYSICAL EXAM: VS:  BP (!) 146/60 (BP Location:  Left Arm, Patient Position: Sitting)   Pulse (!) 53   Ht 5\' 2"  (1.575 m)   Wt 197 lb 6.4 oz (89.5 kg)   SpO2 97%   BMI 36.10 kg/m  , BMI Body mass index is 36.1 kg/m. GEN: Well  nourished, well developed, in no acute distress  HEENT: normal  Neck: no JVD or masses.  Right carotid bruit. Cardiac: RRR; no  rubs, or gallops , trace edema . There is 2/6 systolic ejection murmur in the aortic area.  Respiratory:  clear to auscultation bilaterally, normal work of breathing GI: soft, nontender, nondistended, + BS MS: no deformity or atrophy  Skin: warm and dry, no rash Neuro:  Strength and sensation are intact Psych: euthymic mood, full affect Distal pulses are not palpable.     EKG:  EKG is not ordered today.    Recent Labs: 12/15/2022: TSH 1.700 06/05/2023: B Natriuretic Peptide 285.0 06/06/2023: Hemoglobin 11.6; Magnesium  2.0; Platelets 203 06/15/2023: ALT 32 06/25/2023: BUN 17; Creatinine, Ser 0.96; Potassium 5.1; Sodium 135    Lipid Panel    Component Value Date/Time   CHOL 128 04/08/2023 0226   CHOL 174 12/15/2022 1144   TRIG 134 04/08/2023 0226   TRIG 135 09/16/2012 1322   HDL 52 04/08/2023 0226   HDL 73 12/15/2022 1144   HDL 60 09/16/2012 1322   CHOLHDL 2.5 04/08/2023 0226   VLDL 27 04/08/2023 0226   LDLCALC 49 04/08/2023 0226   LDLCALC 80 12/15/2022 1144   LDLCALC 92 09/16/2012 1322      Wt Readings from Last 3 Encounters:  07/28/23 197 lb 6.4 oz (89.5 kg)  06/15/23 198 lb 3.2 oz (89.9 kg)  06/08/23 199 lb 14.4 oz (90.7 kg)           No data to display            ASSESSMENT AND PLAN:  1. Peripheral arterial disease: Status post  kissing stent placement to the common iliac arteries .  She has moderately reduced ABI at 0.49 bilaterally .  Severe claudication improved significantly with a structured exercise therapy program.  She is now able to walk 30 minutes without having to stop.  2. 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  Angiography in February confirmed patent left renal artery stent with no significant restenosis but there was significant right renal artery in-stent restenosis.  Given that her blood pressure is reasonably controlled with normal renal function, we will continue to monitor this for now.  If she has issues with heart failure again, we will proceed with angioplasty.  3. Coronary artery disease involving native coronary arteries with stable angina: Status post successful PCI and drug-eluting stent placement to the native right coronary artery which was not bypassed before.  Continue long-term dual antiplatelet therapy.  4. Hyperlipidemia: Continue high-dose rosuvastatin .  Recent lipid profile showed improvement in LDL which was down to 49.  Her LP(a) was severely elevated at 241 and we might have to address that at some point in the future.  6.  Chronic diastolic heart failure: Spironolactone  and lisinopril  were  discontinued due to hyperkalemia.  Continue amlodipine  and carvedilol  for blood pressure control.  I considered adding an SGLT2 inhibitor.  However, she has incontinence and wears pads frequently and thus she is at high risk for urinary tract infections.    Disposition:   FU with me in 4  month  Signed,  Antionette Kirks, MD  07/28/2023 1:05 PM    [facility]

## 2023-07-29 ENCOUNTER — Other Ambulatory Visit: Payer: Self-pay | Admitting: Family

## 2023-07-29 ENCOUNTER — Encounter (HOSPITAL_COMMUNITY)
Admission: RE | Admit: 2023-07-29 | Discharge: 2023-07-29 | Disposition: A | Discharge status: Home / Self Care | Source: Ambulatory Visit | Attending: Cardiovascular Disease | Admitting: Cardiovascular Disease

## 2023-07-29 DIAGNOSIS — I70213 Atherosclerosis of native arteries of extremities with intermittent claudication, bilateral legs: Secondary | ICD-10-CM | POA: Diagnosis not present

## 2023-07-29 DIAGNOSIS — I739 Peripheral vascular disease, unspecified: Secondary | ICD-10-CM

## 2023-07-29 DIAGNOSIS — I152 Hypertension secondary to endocrine disorders: Secondary | ICD-10-CM

## 2023-07-29 DIAGNOSIS — Z955 Presence of coronary angioplasty implant and graft: Secondary | ICD-10-CM | POA: Diagnosis not present

## 2023-07-29 LAB — GLUCOSE, CAPILLARY: Glucose-Capillary: 106 mg/dL — ABNORMAL HIGH (ref 70–99)

## 2023-07-29 NOTE — Progress Notes (Signed)
 Daily Session Note  Patient Details  Name: Leah Ferrell MRN: [id number] Date of Birth: Jul 02, 1950 Referring Provider:   Lorrane Rosette Ferrell Supervised Exercise Therapy from 05/25/2023 in [facility] CARDIAC REHABILITATION  Referring Provider Leah Kirks MD       Encounter Date: 07/29/2023  Check In:  Session Check In - 07/29/23 1400       Check-In   Supervising physician immediately available to respond to emergencies See telemetry face sheet for immediately available MD    Location [facility]    Staff Present Ronna Coho BSN, RN;Heather Toy Freund, [place], Exercise Physiologist    Virtual Visit No    Medication changes reported     No    Fall or balance concerns reported    No    Tobacco Cessation No Change    Warm-up and Cool-down Performed on first and last piece of equipment    Resistance Training Performed Yes    VAD Patient? No    PAD/SET Patient? Yes      PAD/SET Patient   Completed foot check today? Yes    Open wounds to report? No      Pain Assessment   Currently in Pain? No/denies    Pain Score 0-No pain    Multiple Pain Sites No             Capillary Blood Glucose: No results found. However, due to the size of the patient record, not all encounters were searched. Please check Results Review for a complete set of results.    Social History   Tobacco Use  Smoking Status Former   Current packs/day: 0.00   Average packs/day: 1 pack/day for 36.8 years (36.8 ttl pk-yrs)   Types: Cigarettes   Start date: 04/21/1964   Quit date: 02/17/2001   Years since quitting: 22.4  Smokeless Tobacco Never    Goals Met:  Independence with exercise equipment Exercise tolerated well No report of concerns or symptoms today Strength training completed today  Goals Unmet:  Not Applicable  Comments: Leah AasAaron AasPt able to follow exercise prescription today without complaint.  Will continue to monitor for progression.

## 2023-07-31 ENCOUNTER — Telehealth: Payer: Self-pay | Admitting: Family

## 2023-07-31 ENCOUNTER — Encounter (HOSPITAL_COMMUNITY)
Admission: RE | Admit: 2023-07-31 | Discharge: 2023-07-31 | Disposition: A | Discharge status: Home / Self Care | Source: Ambulatory Visit | Attending: Cardiovascular Disease

## 2023-07-31 DIAGNOSIS — I739 Peripheral vascular disease, unspecified: Secondary | ICD-10-CM

## 2023-07-31 DIAGNOSIS — I70213 Atherosclerosis of native arteries of extremities with intermittent claudication, bilateral legs: Secondary | ICD-10-CM | POA: Diagnosis not present

## 2023-07-31 DIAGNOSIS — Z955 Presence of coronary angioplasty implant and graft: Secondary | ICD-10-CM | POA: Diagnosis not present

## 2023-07-31 NOTE — Progress Notes (Signed)
 Daily Session Note  Patient Details  Name: Leah Ferrell MRN: [id number] Date of Birth: 1950/03/14 Referring Provider:   Lorrane Rosette Row Supervised Exercise Therapy from 05/25/2023 in [facility] CARDIAC REHABILITATION  Referring Provider Antionette Kirks MD    Encounter Date: 07/31/2023  Check In:  Session Check In - 07/31/23 1430       Check-In   Supervising physician immediately available to respond to emergencies See telemetry face sheet for immediately available MD    Location [facility]    Staff Present Clotilda Danish, BS, Exercise Physiologist;Phyllis Billingsley, RN;Brittany Annette Barters, BSN, RN, WTA-C    Virtual Visit No    Medication changes reported     No    Fall or balance concerns reported    No    Tobacco Cessation No Change    Warm-up and Cool-down Performed on first and last piece of equipment    Resistance Training Performed Yes    VAD Patient? No    PAD/SET Patient? Yes      PAD/SET Patient   Completed foot check today? Yes    Open wounds to report? No      Pain Assessment   Currently in Pain? No/denies    Pain Score 0-No pain    Multiple Pain Sites No          Capillary Blood Glucose: No results found. However, due to the size of the patient record, not all encounters were searched. Please check Results Review for a complete set of results.    Social History   Tobacco Use  Smoking Status Former   Current packs/day: 0.00   Average packs/day: 1 pack/day for 36.8 years (36.8 ttl pk-yrs)   Types: Cigarettes   Start date: 04/21/1964   Quit date: 02/17/2001   Years since quitting: 22.4  Smokeless Tobacco Never    Goals Met:  Independence with exercise equipment Exercise tolerated well No report of concerns or symptoms today Strength training completed today  Goals Unmet:  Not Applicable  Comments: Pt able to follow exercise prescription today without complaint.  Will continue to monitor for progression.

## 2023-07-31 NOTE — Telephone Encounter (Signed)
 Patient is checking on ozempic  patient assistance

## 2023-07-31 NOTE — Telephone Encounter (Signed)
 Called and spoke with patient, making her aware that we have her medication here at our office that needs to be picked up asap. Medication is in the lab refrigerator. Patient said she would come by the office this afternoon to pick it up.

## 2023-08-03 ENCOUNTER — Encounter (HOSPITAL_COMMUNITY)
Admission: RE | Admit: 2023-08-03 | Discharge: 2023-08-03 | Disposition: A | Discharge status: Home / Self Care | Source: Ambulatory Visit | Attending: Cardiovascular Disease | Admitting: Cardiovascular Disease

## 2023-08-03 DIAGNOSIS — I739 Peripheral vascular disease, unspecified: Secondary | ICD-10-CM

## 2023-08-03 LAB — GLUCOSE, CAPILLARY
Glucose-Capillary: 86 mg/dL (ref 70–99)
Glucose-Capillary: 102 mg/dL — ABNORMAL HIGH (ref 70–99)

## 2023-08-03 NOTE — Progress Notes (Signed)
 Incomplete Session Note  Patient Details  Name: Leah Ferrell MRN: [id number] Date of Birth: 11-25-1950 Referring Provider:   Lorrane Rosette Row Supervised Exercise Therapy from 05/25/2023 in [facility] CARDIAC REHABILITATION  Referring Provider Antionette Kirks MD    [name] did not complete her rehab session.  Patient requested we check her blood glucose: 88 mg/dl. She drank a yahoo and was rechecked at 102 mg/dl. She was outside of our parameters to exercise. She verbalized understanding.

## 2023-08-03 NOTE — Telephone Encounter (Signed)
 Informed patient that refills are in process and being completed by doctor. Let her know that we will reach out as soon as meds get to the office. Patient was appreciative.

## 2023-08-05 ENCOUNTER — Encounter (HOSPITAL_COMMUNITY)
Admission: RE | Admit: 2023-08-05 | Discharge: 2023-08-05 | Disposition: A | Discharge status: Home / Self Care | Source: Ambulatory Visit | Attending: Cardiovascular Disease

## 2023-08-05 DIAGNOSIS — I70213 Atherosclerosis of native arteries of extremities with intermittent claudication, bilateral legs: Secondary | ICD-10-CM | POA: Diagnosis not present

## 2023-08-05 DIAGNOSIS — I739 Peripheral vascular disease, unspecified: Secondary | ICD-10-CM

## 2023-08-05 DIAGNOSIS — Z955 Presence of coronary angioplasty implant and graft: Secondary | ICD-10-CM | POA: Diagnosis not present

## 2023-08-05 LAB — GLUCOSE, CAPILLARY: Glucose-Capillary: 115 mg/dL — ABNORMAL HIGH (ref 70–99)

## 2023-08-05 NOTE — Progress Notes (Signed)
 PAD/SET Plan of Care Updates  Patient Details  Name: Leah Ferrell MRN: [id number] Date of Birth: [birth year]  Entry Date:  Flowsheet Row Supervised Exercise Therapy from 05/25/2023 in [place] [facility]  Date 05/25/23   Expected Discharge Date:  Flowsheet Row Supervised Exercise Therapy from 05/25/2023 in [place] [facility]  Expected Discharge Date 08/14/23    Medical Diagnosis: PAD (peripheral artery disease) [facility])  Encounter Date: 08/05/23  Exercise Target Goals: Exercise Program Goal: Individual exercise prescription set with THRR, safety & activity barriers. Participant demonstrates ability to understand and report RPE using RPE 6-20 scale, to self-measure pulse accurately, and to acknowledge the importance of the exercise prescription.  Exercise Prescription Goal: Use initial exercise assessment to set exercise prescription to improve time and distance to onset of claudication pain. Provide education to aid in steps toward risk factor modification, improve quality of life with claudication, and utilize THRR and exercise prescription for best results for decreasing symptoms of PAD. Prevent injury to bone, joints, and skin integrity during the exercise through checks of each system during session check in. Following physician guidelines for any contraindications to specific exercises.  Activity Barriers:  Activity Barriers & Cardiac Risk Stratification - 05/25/23 1413       Activity Barriers & Cardiac Risk Stratification   Activity Barriers Deconditioning;Muscular Weakness;Assistive Device;Balance Concerns;Shortness of Breath          Initial Exercise Prescription:  Initial Exercise Prescription - 05/25/23 1600       Date of Initial Exercise RX and Referring Provider   Date 05/25/23    Referring Provider Antionette Kirks MD    Expected Discharge Date 08/14/23      Oxygen   Maintain Oxygen Saturation 88% or higher      Treadmill   MPH 1.7     Grade 0    Minutes 10    METs 2.3      Prescription Details   Frequency (times per week) 3    Duration Progress to 10 minutes continuous walking  at current work load and total walking time to 30-45 min      Intensity   THRR 40-80% of Max Heartrate 89-128    Ratings of Perceived Exertion 11-13    Perceived Dyspnea 0-4      Progression   Progression Continue to follow PAD protocol      Resistance Training   Training Prescription Yes    Weight 3 lb    Reps 10-15          Perform Capillary Blood Glucose checks as needed.  Current Exercise Prescription:   Exercise Prescription Changes     Row Name 05/25/23 1600             Response to Exercise   Blood Pressure (Admit) 112/62       Blood Pressure (Exercise) 138/72       Blood Pressure (Exit) 126/74       Heart Rate (Admit) 49 bpm       Heart Rate (Exercise) 73 bpm       Heart Rate (Exit) 60 bpm       Oxygen Saturation (Admit) 94 %       Oxygen Saturation (Exercise) 96 %       Oxygen Saturation (Exit) 96 %       Rating of Perceived Exertion (Exercise) 17       Perceived Dyspnea (Exercise) 1       Symptoms CPS 4, slightly SOB  Comments treadmill test          Exercise Comments:   Exercise Comments     Row Name 05/27/23 1449           Exercise Comments First full day of exercise!  Patient was oriented to gym and equipment including functions, settings, policies, and procedures.  Patient's individual exercise prescription and treatment plan were reviewed.  All starting workloads were established based on the results of the 6 minute walk test done at initial orientation visit.  The plan for exercise progression was also introduced and progression will be customized based on patient's performance and goals.          Program and Patient Goals Exercise Goals and Review:   Exercise Goals     Row Name 05/25/23 1613             Exercise Goals   Increase Physical Activity Yes       Intervention  Provide advice, education, support and counseling about physical activity/exercise needs.;Develop an individualized exercise prescription for aerobic and resistive training based on initial evaluation findings, risk stratification, comorbidities and participant's personal goals.       Expected Outcomes Long Term: Exercising regularly at least 3-5 days a week.;Short Term: Attend rehab on a regular basis to increase amount of physical activity.;Long Term: Add in home exercise to make exercise part of routine and to increase amount of physical activity.       Increase Strength and Stamina Yes       Intervention Provide advice, education, support and counseling about physical activity/exercise needs.;Develop an individualized exercise prescription for aerobic and resistive training based on initial evaluation findings, risk stratification, comorbidities and participant's personal goals.       Expected Outcomes Short Term: Increase workloads from initial exercise prescription for resistance, speed, and METs.;Short Term: Perform resistance training exercises routinely during rehab and add in resistance training at home;Long Term: Improve cardiorespiratory fitness, muscular endurance and strength as measured by increased METs and functional capacity ( )       Able to understand and use rate of perceived exertion (RPE) scale Yes       Intervention Provide education and explanation on how to use RPE scale       Expected Outcomes Short Term: Able to use RPE daily in rehab to express subjective intensity level;Long Term:  Able to use RPE to guide intensity level when exercising independently       Able to understand and use Dyspnea scale Yes       Intervention Provide education and explanation on how to use Dyspnea scale       Expected Outcomes Long Term: Able to use Dyspnea scale to guide intensity level when exercising independently;Short Term: Able to use Dyspnea scale daily in rehab to express subjective sense  of shortness of breath during exertion       Knowledge and understanding of Target Heart Rate Range (THRR) Yes       Intervention Provide education and explanation of THRR including how the numbers were predicted and where they are located for reference       Expected Outcomes Short Term: Able to state/look up THRR;Long Term: Able to use THRR to govern intensity when exercising independently;Short Term: Able to use daily as guideline for intensity in rehab       Able to check pulse independently Yes       Intervention Provide education and demonstration on how to check pulse in carotid and  radial arteries.;Review the importance of being able to check your own pulse for safety during independent exercise       Expected Outcomes Short Term: Able to explain why pulse checking is important during independent exercise;Long Term: Able to check pulse independently and accurately       Understanding of Exercise Prescription Yes       Intervention Provide education, explanation, and written materials on patient's individual exercise prescription       Expected Outcomes Short Term: Able to explain program exercise prescription;Long Term: Able to explain home exercise prescription to exercise independently       Improve claudication pain toleration; Improve walking ability Yes       Intervention Participate in PAD/SET Rehab 2-3 days a week, walking at home as part of exercise prescription;Attend education sessions to aid in risk factor modification and understanding of disease process       Expected Outcomes Short Term: Improve walking distance/time to onset of claudication pain;Long Term: Improve walking ability and toleration to claudication;Long Term: Improve score of PAD questionnaires          Nutrition:  Target Goals: Understanding of nutrition guidelines, daily intake of sodium 1500mg , cholesterol 200mg , calories 30% from fat and 7% or less from saturated fats, daily to have 5 or more servings of  fruits and vegetables.  Biometrics:  Pre Biometrics - 05/25/23 1614       Pre Biometrics   Height 5' 1.25 (1.556 m)    Weight 93.4 kg    Waist Circumference 40 inches    Hip Circumference 46.25 inches    Waist to Hip Ratio 0.86 %    BMI (Calculated) 38.59    Grip Strength 33 kg    Single Leg Stand 3.1 seconds           Nutrition Therapy Plan and Nutrition Goals:  Nutrition Therapy & Goals - 05/25/23 1615       Intervention Plan   Intervention Prescribe, educate and counsel regarding individualized specific dietary modifications aiming towards targeted core components such as weight, hypertension, lipid management, diabetes, heart failure and other comorbidities.;Nutrition handout(s) given to patient.    Expected Outcomes Short Term Goal: Understand basic principles of dietary content, such as calories, fat, sodium, cholesterol and nutrients.;Long Term Goal: Adherence to prescribed nutrition plan.          Core Components/Risk Factors/Patient Goals at Admission:  Personal Goals and Risk Factors at Admission - 05/25/23 1619       Core Components/Risk Factors/Patient Goals on Admission    Weight Management Obesity;Yes;Weight Loss    Intervention Weight Management: Develop a combined nutrition and exercise program designed to reach desired caloric intake, while maintaining appropriate intake of nutrient and fiber, sodium and fats, and appropriate energy expenditure required for the weight goal.;Weight Management: Provide education and appropriate resources to help participant work on and attain dietary goals.;Weight Management/Obesity: Establish reasonable short term and long term weight goals.;Obesity: Provide education and appropriate resources to help participant work on and attain dietary goals.    Admit Weight 206 lb (93.4 kg)    Goal Weight: Short Term 201 lb (91.2 kg)    Goal Weight: Long Term 196 lb (88.9 kg)    Expected Outcomes Short Term: Continue to assess and  modify interventions until short term weight is achieved;Long Term: Adherence to nutrition and physical activity/exercise program aimed toward attainment of established weight goal;Weight Loss: Understanding of general recommendations for a balanced deficit meal plan, which promotes 1-2  lb weight loss per week and includes a negative energy balance of [phone] kcal/d;Understanding recommendations for meals to include 15-35% energy as protein, 25-35% energy from fat, 35-60% energy from carbohydrates, less than 200mg  of dietary cholesterol, 20-35 gm of total fiber daily;Understanding of distribution of calorie intake throughout the day with the consumption of 4-5 meals/snacks    Improve shortness of breath with ADL's Yes    Intervention Provide education, individualized exercise plan and daily activity instruction to help decrease symptoms of SOB with activities of daily living.    Expected Outcomes Short Term: Improve cardiorespiratory fitness to achieve a reduction of symptoms when performing ADLs;Long Term: Be able to perform more ADLs without symptoms or delay the onset of symptoms    Diabetes Yes    Intervention Provide education about signs/symptoms and action to take for hypo/hyperglycemia.;Provide education about proper nutrition, including hydration, and aerobic/resistive exercise prescription along with prescribed medications to achieve blood glucose in normal ranges: Fasting glucose 65-99 mg/dL    Expected Outcomes Short Term: Participant verbalizes understanding of the signs/symptoms and immediate care of hyper/hypoglycemia, proper foot care and importance of medication, aerobic/resistive exercise and nutrition plan for blood glucose control.;Long Term: Attainment of HbA1C < 7%.    Heart Failure Yes    Intervention Provide a combined exercise and nutrition program that is supplemented with education, support and counseling about heart failure. Directed toward relieving symptoms such as shortness of  breath, decreased exercise tolerance, and extremity edema.    Expected Outcomes Improve functional capacity of life;Short term: Attendance in program 2-3 days a week with increased exercise capacity. Reported lower sodium intake. Reported increased fruit and vegetable intake. Reports medication compliance.;Short term: Daily weights obtained and reported for increase. Utilizing diuretic protocols set by physician.;Long term: Adoption of self-care skills and reduction of barriers for early signs and symptoms recognition and intervention leading to self-care maintenance.    Hypertension Yes    Intervention Provide education on lifestyle modifcations including regular physical activity/exercise, weight management, moderate sodium restriction and increased consumption of fresh fruit, vegetables, and low fat dairy, alcohol  moderation, and smoking cessation.;Monitor prescription use compliance.    Expected Outcomes Short Term: Continued assessment and intervention until BP is < 140/65mm HG in hypertensive participants. < 130/56mm HG in hypertensive participants with diabetes, heart failure or chronic kidney disease.;Long Term: Maintenance of blood pressure at goal levels.    Lipids Yes    Intervention Provide education and support for participant on nutrition & aerobic/resistive exercise along with prescribed medications to achieve LDL 70mg , HDL >40mg .    Expected Outcomes Short Term: Participant states understanding of desired cholesterol values and is compliant with medications prescribed. Participant is following exercise prescription and nutrition guidelines.;Long Term: Cholesterol controlled with medications as prescribed, with individualized exercise RX and with personalized nutrition plan. Value goals: LDL < 70mg , HDL > 40 mg.          ITP Comments:  ITP Comments     Row Name 05/18/23 1034 05/25/23 1601 05/27/23 1448 05/29/23 1500 06/10/23 1338   ITP Comments Virtual orientation visit completed for  the PAD program. On-site orientation visit scheduled for 05/20/23 at 1300. Documentation mailed and patient says her daughter helped her complete. Patient attend orientation today.  Patient is attending Supervised Exercise Therapy for PAD.  Documentation for diagnosis can be found in [facility] 04/28/23.  Reviewed medical chart, RPE/RPD, gym safety, and program guidelines.  Patient was fitted to equipment they will be using during rehab.  Patient is  scheduled to start exercise on Wednesday 05/27/23 at 1430.   Initial ITP created and sent for review and signature by Dr. Antionette Kirks, referring physician. First full day of exercise!  Patient was oriented to gym and equipment including functions, settings, policies, and procedures.  Patient's individual exercise prescription and treatment plan were reviewed.  All starting workloads were established based on the results of the 6 minute walk test done at initial orientation visit.  The plan for exercise progression was also introduced and progression will be customized based on patient's performance and goals. [name] did not complete her rehab session.  Patient informed staff that she was in the ED yesterday with her systolic blood pressure 200 and she had dizziness. There were no acute abnormal findings in the ED. She was told to follow up with her PCP. She was not able to get an appointment with them until 4/29. She stated the dizziness had subsided. Her blood pressure was 148/60 which is higher than usual. We informed her that she needed medical clearance before resuming exercise. She verbalized understanding and was going to try and get an earlier appointment with her PCP. I sent a message to Dr. Alvenia Aus to request he review her ED notes and give clearance for her to return due to her not being able to make up missed appointments. He said from his standpoint, she was okay to return to the program. Patient will be notified. 30 day review completed. Plan of Care  Update sent to Dr. Alvenia Aus, referring physician. Continue with Plan of Care unless changes are made by physician. Currently awaiting clearance to return to rehab.    Row Name 07/08/23 1015 08/05/23 1007         ITP Comments 30 day review completed. Plan of Care Update sent to Dr. Alvenia Aus, referring physician. Continue with Plan of Care unless changes are made by physician. 30 day review completed. Plan of Care Update sent to Dr. Alvenia Aus, referring physician. Continue with Plan of Care unless changes are made by physician.         Comments: 30 Day Review   Physician Documentation: Your signature/cosign is required to indicate approval of the Care Plan as stated above. By signing this report, you are approving the Plan of Care. Please sign and either send electronically or print and fax the signed copy to the number below. Please indicate any changes or limitations in the space provided.   ____________________________________________________________________________________________________________________________________________  Physician Signature: _____________________________________________________ Print Physician Name: ____________________________________________________ Date: ___________________________ Time: _________________________________  [facility] [address] [place], [place] [address]  Phone: [phone] Fax: [phone]

## 2023-08-05 NOTE — Progress Notes (Signed)
 Daily Session Note  Patient Details  Name: Leah Ferrell MRN: [id number] Date of Birth: Jul 26, 1950 Referring Provider:   Lorrane Rosette Row Supervised Exercise Therapy from 05/25/2023 in [facility]  Referring Provider Antionette Kirks MD    Encounter Date: 08/05/2023  Check In:  Session Check In - 08/05/23 1430       Check-In   Supervising physician immediately available to respond to emergencies See telemetry face sheet for immediately available MD    Location [facility]    Staff Present Clotilda Danish, BS, Exercise Physiologist;Brooke Rollin Clock, RN;Brittany Annette Barters, BSN, RN, WTA-C    Virtual Visit No    Medication changes reported     No    Fall or balance concerns reported    No    Tobacco Cessation No Change    Warm-up and Cool-down Performed on first and last piece of equipment    Resistance Training Performed Yes    VAD Patient? No    PAD/SET Patient? Yes      PAD/SET Patient   Completed foot check today? Yes    Open wounds to report? No      Pain Assessment   Currently in Pain? No/denies    Pain Score 0-No pain    Multiple Pain Sites No          Capillary Blood Glucose: Results for orders placed or performed during the hospital encounter of 08/05/23 (from the past 24 hours)  Glucose, capillary     Status: Abnormal   Collection Time: 08/05/23  2:10 PM  Result Value Ref Range   Glucose-Capillary 115 (H) 70 - 99 mg/dL   *Note: Due to a large number of results and/or encounters for the requested time period, some results have not been displayed. A complete set of results can be found in Results Review.      Social History   Tobacco Use  Smoking Status Former   Current packs/day: 0.00   Average packs/day: 1 pack/day for 36.8 years (36.8 ttl pk-yrs)   Types: Cigarettes   Start date: 04/21/1964   Quit date: 02/17/2001   Years since quitting: 22.4  Smokeless Tobacco Never    Goals Met:  Independence with exercise  equipment Exercise tolerated well No report of concerns or symptoms today Strength training completed today  Goals Unmet:  Not Applicable  Comments: Pt able to follow exercise prescription today without complaint.  Will continue to monitor for progression.

## 2023-08-07 ENCOUNTER — Encounter (HOSPITAL_COMMUNITY)

## 2023-08-07 NOTE — Telephone Encounter (Signed)
   Patient enrolled for Tresiba , Ozempic , and Novolog .

## 2023-08-10 ENCOUNTER — Encounter (HOSPITAL_COMMUNITY)
Admission: RE | Admit: 2023-08-10 | Discharge: 2023-08-10 | Disposition: A | Discharge status: Home / Self Care | Source: Ambulatory Visit | Attending: Cardiovascular Disease | Admitting: Cardiovascular Disease

## 2023-08-10 DIAGNOSIS — I70213 Atherosclerosis of native arteries of extremities with intermittent claudication, bilateral legs: Secondary | ICD-10-CM | POA: Diagnosis not present

## 2023-08-10 DIAGNOSIS — Z955 Presence of coronary angioplasty implant and graft: Secondary | ICD-10-CM | POA: Diagnosis not present

## 2023-08-10 DIAGNOSIS — I739 Peripheral vascular disease, unspecified: Secondary | ICD-10-CM

## 2023-08-10 NOTE — Progress Notes (Signed)
 Daily Session Note  Patient Details  Name: JAMEELA MICHNA MRN: [id number] Date of Birth: 09-05-1950 Referring Provider:   Conrad Row Supervised Exercise Therapy from 05/25/2023 in [facility]  Referring Provider Darron Grass MD    Encounter Date: 08/10/2023  Check In:  Session Check In - 08/10/23 1426       Check-In   Supervising physician immediately available to respond to emergencies See telemetry face sheet for immediately available MD    Location [facility]    Staff Present Powell Benders, BS, Exercise Physiologist;Naila Elizondo Jackquline, BSN, RN, WTA-C;Phyllis Billingsley, RN    Virtual Visit No    Medication changes reported     No    Fall or balance concerns reported    No    Tobacco Cessation No Change    Warm-up and Cool-down Performed on first and last piece of equipment    Resistance Training Performed Yes    VAD Patient? No    PAD/SET Patient? Yes      PAD/SET Patient   Completed foot check today? Yes    Open wounds to report? No      Pain Assessment   Currently in Pain? No/denies          Capillary Blood Glucose: No results found. However, due to the size of the patient record, not all encounters were searched. Please check Results Review for a complete set of results.    Social History   Tobacco Use  Smoking Status Former   Current packs/day: 0.00   Average packs/day: 1 pack/day for 36.8 years (36.8 ttl pk-yrs)   Types: Cigarettes   Start date: 04/21/1964   Quit date: 02/17/2001   Years since quitting: 22.4  Smokeless Tobacco Never    Goals Met:  Independence with exercise equipment Exercise tolerated well No report of concerns or symptoms today Strength training completed today  Goals Unmet:  Not Applicable  Comments: Pt able to follow exercise prescription today without complaint.  Will continue to monitor for progression.

## 2023-08-11 ENCOUNTER — Ambulatory Visit: Admitting: Family

## 2023-08-11 ENCOUNTER — Encounter: Payer: Self-pay | Admitting: Family

## 2023-08-11 VITALS — BP 157/58 | HR 54 | Temp 97.5°F | Ht 62.0 in | Wt 198.2 lb

## 2023-08-11 DIAGNOSIS — E782 Mixed hyperlipidemia: Secondary | ICD-10-CM | POA: Diagnosis not present

## 2023-08-11 DIAGNOSIS — Z0000 Encounter for general adult medical examination without abnormal findings: Secondary | ICD-10-CM | POA: Diagnosis not present

## 2023-08-11 DIAGNOSIS — I739 Peripheral vascular disease, unspecified: Secondary | ICD-10-CM | POA: Diagnosis not present

## 2023-08-11 DIAGNOSIS — I252 Old myocardial infarction: Secondary | ICD-10-CM | POA: Diagnosis not present

## 2023-08-11 DIAGNOSIS — E1151 Type 2 diabetes mellitus with diabetic peripheral angiopathy without gangrene: Secondary | ICD-10-CM

## 2023-08-11 DIAGNOSIS — Z0001 Encounter for general adult medical examination with abnormal findings: Secondary | ICD-10-CM | POA: Diagnosis not present

## 2023-08-11 DIAGNOSIS — I25708 Atherosclerosis of coronary artery bypass graft(s), unspecified, with other forms of angina pectoris: Secondary | ICD-10-CM

## 2023-08-11 DIAGNOSIS — Z951 Presence of aortocoronary bypass graft: Secondary | ICD-10-CM | POA: Diagnosis not present

## 2023-08-11 DIAGNOSIS — K219 Gastro-esophageal reflux disease without esophagitis: Secondary | ICD-10-CM | POA: Diagnosis not present

## 2023-08-11 DIAGNOSIS — I10 Essential (primary) hypertension: Secondary | ICD-10-CM

## 2023-08-11 LAB — BAYER DCA HB A1C WAIVED: HB A1C (BAYER DCA - WAIVED): 7.8 % — ABNORMAL HIGH (ref 4.8–5.6)

## 2023-08-11 NOTE — Progress Notes (Signed)
 Subjective:    Patient ID: Leah Ferrell, female    DOB: [birth year], 73 y.o.   MRN: [id number]  Chief Complaint  Patient presents with   Medical Management of Chronic Issues   Leah Ferrell presents to the office today for CPE and chronic follow up.   Leah Ferrell has CHF, CAD and hx MI and is followed by Cardiologists every 6 months.  Echo on 06/06/2023 with EF of 60-65 %, no regional wall motion abnormalities, no mitral stenosis, prior echo from February 2025 with grade 1 diastolic dysfunction.   Doing Leah Ferrell TID a week.    Leah Ferrell is followed by Vascular  for PVD and PAD. Leah Ferrell states these are stable.    Leah Ferrell had a renal angiography on 03/16/19.    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 Leah Ferrell had a CABG on 06/25/20.    Leah Ferrell is morbid obese because her BMI is 36 and has DM and HTN.   Hypertension This is a chronic problem. The current episode started more than 1 year ago. The problem is unchanged. The problem is uncontrolled. Associated symptoms include malaise/fatigue, peripheral edema (when staying too long) and shortness of breath. Pertinent negatives include no blurred vision. Risk factors for coronary artery disease include diabetes mellitus, dyslipidemia, obesity and sedentary lifestyle. The current treatment provides moderate improvement. Hypertensive end-organ damage includes PVD.  Diabetes Leah Ferrell presents for her follow-up diabetic visit. Leah Ferrell has type 2 diabetes mellitus. Associated symptoms include fatigue and foot paresthesias. Pertinent negatives for diabetes include no blurred vision. Symptoms are stable. Diabetic complications include peripheral neuropathy and PVD. Risk factors for coronary artery disease include diabetes mellitus, dyslipidemia, hypertension, sedentary lifestyle and post-menopausal. Leah Ferrell is following a generally unhealthy diet. Her overall blood glucose range is 180-200 mg/dl. Eye exam is current.  Congestive Heart Failure Presents for follow-up  visit. Associated symptoms include edema, fatigue and shortness of breath. The symptoms have been stable.      Review of Systems  Constitutional:  Positive for fatigue and malaise/fatigue.  Eyes:  Negative for blurred vision.  Respiratory:  Positive for shortness of breath.   All other systems reviewed and are negative.  Family History  Problem Relation Age of Onset   Diabetes Mother    Hyperlipidemia Mother    Hypertension Mother    Peripheral vascular disease Mother    Other Sister        drowned   Other Sister        drowned   Diabetes Daughter        borderline   Hypertension Daughter    Diabetes Maternal Grandmother    Heart attack Maternal Grandfather    Heart disease Brother    Diabetes Brother    Alcohol  abuse Brother    Diabetes Son    Hypertension Son    Hyperlipidemia Son    Breast cancer Neg Hx    Social History   Socioeconomic History   Marital status: Widowed    Spouse name: Not on file   Number of children: 4   Years of education: completed school in [place]    Highest education level: Not on file  Occupational History   Occupation: retired  Tobacco Use   Smoking status: Former    Current packs/day: 0.00    Average packs/day: 1 pack/day for 36.8 years (36.8 ttl pk-yrs)    Types: Cigarettes    Start date: 04/21/1964    Quit date: 02/17/2001    Years since  quitting: 22.4   Smokeless tobacco: Never  Vaping Use   Vaping status: Never Used  Substance and Sexual Activity   Alcohol  use: No    Alcohol /week: 0.0 standard drinks of alcohol    Drug use: No   Sexual activity: Not Currently    Birth control/protection: Post-menopausal  Other Topics Concern   Not on file  Social History Narrative   Lives alone - one level - all of her children live far away   Social Drivers of Health   Financial Resource Strain: Low Risk  (12/25/2022)   Overall Financial Resource Strain (CARDIA)    Difficulty of Paying Living Expenses: Not hard at all  Food  Insecurity: No Food Insecurity (04/06/2023)   Hunger Vital Sign    Worried About Running Out of Food in the Last Year: Never true    Ran Out of Food in the Last Year: Never true  Transportation Needs: No Transportation Needs (04/06/2023)   PRAPARE - Administrator, Civil Service (Medical): No    Lack of Transportation (Non-Medical): No  Physical Activity: Insufficiently Active (12/25/2022)   Exercise Vital Sign    Days of Exercise per Week: 3 days    Minutes of Exercise per Session: 30 min  Stress: No Stress Concern Present (12/25/2022)   [employer] of Occupational Health - Occupational Stress Questionnaire    Feeling of Stress : Not at all  Social Connections: Moderately Isolated (04/06/2023)   Social Connection and Isolation Panel    Frequency of Communication with Friends and Family: More than three times a week    Frequency of Social Gatherings with Friends and Family: More than three times a week    Attends Religious Services: 1 to 4 times per year    Active Member of [employer] or Organizations: No    Attends Banker Meetings: Never    Marital Status: Widowed       Objective:   Physical Exam Vitals reviewed.  Constitutional:      General: Leah Ferrell is not in acute distress.    Appearance: Leah Ferrell is well-developed. Leah Ferrell is obese.  HENT:     Head: Normocephalic and atraumatic.     Right Ear: Tympanic membrane normal.     Left Ear: Tympanic membrane normal.   Eyes:     Pupils: Pupils are equal, round, and reactive to light.   Neck:     Thyroid : No thyromegaly.   Cardiovascular:     Rate and Rhythm: Normal rate and regular rhythm.     Heart sounds: Normal heart sounds. No murmur heard. Pulmonary:     Effort: Pulmonary effort is normal. No respiratory distress.     Breath sounds: Normal breath sounds. No wheezing.  Abdominal:     General: Bowel sounds are normal. There is no distension.     Palpations: Abdomen is soft.     Tenderness: There is no  abdominal tenderness.   Musculoskeletal:        General: No tenderness. Normal range of motion.     Cervical back: Normal range of motion and neck supple.     Right lower leg: Edema (trace) present.     Left lower leg: Edema (trace) present.   Skin:    General: Skin is warm and dry.   Neurological:     Mental Status: Leah Ferrell is alert and oriented to person, place, and time.     Cranial Nerves: No cranial nerve deficit.     Deep Tendon Reflexes: Reflexes  are normal and symmetric.   Psychiatric:        Behavior: Behavior normal.        Thought Content: Thought content normal.        Judgment: Judgment normal.       BP (!) 178/63   Pulse (!) 54   Temp (!) 97.5 F (36.4 C) (Temporal)   Ht 5' 2 (1.575 m)   Wt 198 lb 3.2 oz (89.9 kg)   SpO2 95%   BMI 36.25 kg/m      Assessment & Plan:   Leah Ferrell comes in today with chief complaint of Medical Management of Chronic Issues   Diagnosis and orders addressed:  1. Essential hypertension - CMP14+EGFR - CBC with Differential/Platelet - TSH  2. Diabetes mellitus with peripheral vascular disease (HCC) - CMP14+EGFR - Microalbumin / creatinine urine ratio - Bayer DCA Hb A1c Waived - CBC with Differential/Platelet - TSH - Vitamin B12  3. Mixed hyperlipidemia - CMP14+EGFR - Lipid panel - CBC with Differential/Platelet  4. Hx of non-ST elevation myocardial infarction (NSTEMI) - CMP14+EGFR - Lipid panel - CBC with Differential/Platelet  5. Coronary artery disease of bypass graft of native heart with stable angina pectoris (HCC)  - CMP14+EGFR - CBC with Differential/Platelet  6. PVD (peripheral vascular disease) (HCC) - CMP14+EGFR - CBC with Differential/Platelet  7. PAD (peripheral artery disease) (HCC) - CMP14+EGFR - CBC with Differential/Platelet  8. Morbid obesity (HCC) - CMP14+EGFR - CBC with Differential/Platelet  9. S/P CABG x 3 - CMP14+EGFR - CBC with Differential/Platelet  10.  Gastroesophageal reflux disease without esophagitis - CMP14+EGFR - CBC with Differential/Platelet  11. Annual physical exam (Primary) - CMP14+EGFR - Microalbumin / creatinine urine ratio - Bayer DCA Hb A1c Waived - Lipid panel - CBC with Differential/Platelet - TSH - Vitamin B12  Labs pending Continue current medications  Keep specialists follow up BP elevated today, but decreasing sitting Keep all follow up with specialists  Health Maintenance reviewed Diet and exercise encouraged  Follow up plan: 3 months    Bari Learn, FNP

## 2023-08-11 NOTE — Patient Instructions (Signed)
 Heart Failure: How to Manage Heart failure is a long-term condition where your heart can't pump enough blood through your body. When this happens, parts of your body don't get the blood and oxygen they need. There's no cure for heart failure, so it's important to take good care of yourself and follow the treatment plan you set with your health care provider. If you're living with heart failure, there are ways to help you manage the disease. How to manage lifestyle changes Living with heart failure requires you to make changes in your daily life. Your health care team will teach you about the changes you need to make. These changes can help improve your symptoms and lower your risk of going to the hospital. Work with your provider to create a plan for you. Activity Ask your provider about cardiac rehabilitation programs. These programs include physical activity. If no cardiac rehab program is available, ask your provider what exercises are safe for you to do. Ask what things are safe for you to do at home. Ask when you can go back to work or school. Pace your daily activities and allow time for rest. Managing stress It's normal to have many emotions when you're told you have heart failure. You may have fear, sadness, and anger. If you need help coping with any of these emotions, let your provider know. Here are some ways to help yourself manage these emotions: Talk to your friends and family about your condition. They can give you support and guidance. Explain your symptoms to them. If comfortable, you can invite them to attend appointments with you. Join a support group for people with heart failure. Talking with others who have the same symptoms may give you new ways of coping with your disease and your emotions. Accept help from others. Do not be ashamed if you need help. Use stress management techniques. Try things like meditation, breathing exercises, or listening to relaxing music. Conditions  like depression and anxiety are common in people with heart failure. Pay attention to changes in your mood, emotions, and stress levels. Tell your provider if you have any of the following symptoms: Trouble sleeping or a change in your sleeping patterns. Feeling sad or depressed. Losing interest in activities you normally enjoy. Feeling irritable or crying for no reason. Often worrying about the future. Work If heart failure affects your ability to work, you may need to talk with your provider about making a plan for changes. This may include: Reducing your work hours. Finding tasks that require less effort. Planning rest periods during work hours. Travel Talk with your provider if you plan to travel. There may be times when your provider suggests that you don't travel or you wait until your condition has improved. Bring your medicine and a list of your medicines. If you're traveling by public transportation (airplane, train, bus), keep your medicines with you in a carry-on bag. If you have special needs, contact the transportation company prior to traveling. This may include needs related to diet, oxygen, a wheelchair, a seating request, or help with luggage. Think about finding a medical facility in the area you'll be traveling to. Find out what your health insurance will cover. If you use oxygen, make sure you bring enough oxygen with you. If you have a battery-powered oxygen device, bring a fully charged extra battery with you. If you have an implanted device, bring a note from your provider and tell the security screening workers that you have the device. You may  need to go through special screening. Sexual activity  Ask your provider when it's safe for you to resume sexual activity. You may need to start slowly and increase intimacy over time. Regular exercise can benefit your sex life by building strength and endurance. Sleep If your condition affects your sleep, find ways to improve  how well you sleep at night. These tips may help: Sleep lying on your side, or sleep with your head raised. Try: Raising the head of your bed. Using more than one pillow. Ask your provider about screening for sleep apnea. Try to go to sleep and wake up at the same times every day. Sleep in a dark, cool room. Do not exercise or eat for a few hours before bedtime. Plan rest periods during the day. But don't take long naps.  Where to find support In addition to talking with your family or friends, consider talking with: A mental health professional or therapist. A member of your church, faith, or community group. Other sources of support include: Local support groups. Ask your provider about groups near you. Online support groups, such as those found through: Heart Failure Society of America: hfsa.org American Heart Association: supportnetwork.heart.org Local community agencies or social agencies. A palliative care specialist. Palliative care can help manage symptoms, promote comfort, improve quality of life, and maintain dignity. Where to find more information To learn more, go to these websites: Centers for Disease Control and Prevention at TonerPromos.no. Then: Click Health Topics. Type "heart failure" in the search box. American Heart Association: heart.org National Heart, Lung, and Blood Institute: BuffaloDryCleaner.gl Contact a health care provider if: You have trouble breathing when you're active. You have swelling in your feet or legs. You gain 2-3 lb (0.9-1.4 kg) in 24 hours, or 5 lb (2.3 kg) in a week. You have a dry cough. You're not able to take part in your usual physical activities. You feel dizzy or light-headed when you stand up. You have trouble sleeping. You have a decrease in appetite. You have symptoms of depression or anxiety. Get help right away if: You have trouble breathing when resting. You have trouble staying awake or you feel confused. You have chest pain. You  faint. You have fast or irregular heartbeats. These symptoms may be an emergency. Call 911 right away. Do not wait to see if the symptoms will go away. Do not drive yourself to the hospital. This information is not intended to replace advice given to you by your health care provider. Make sure you discuss any questions you have with your health care provider. Document Revised: 09/18/2022 Document Reviewed: 09/18/2022 Elsevier Patient Education  2024 [employer].

## 2023-08-11 NOTE — Addendum Note (Signed)
 Addended by: MICHELINE ROSINA FALCON on: 08/11/2023 12:03 PM   Modules accepted: Orders

## 2023-08-12 ENCOUNTER — Encounter (HOSPITAL_COMMUNITY)
Admission: RE | Admit: 2023-08-12 | Discharge: 2023-08-12 | Disposition: A | Discharge status: Home / Self Care | Source: Ambulatory Visit | Attending: Cardiovascular Disease

## 2023-08-12 DIAGNOSIS — Z955 Presence of coronary angioplasty implant and graft: Secondary | ICD-10-CM | POA: Diagnosis not present

## 2023-08-12 DIAGNOSIS — I110 Hypertensive heart disease with heart failure: Secondary | ICD-10-CM | POA: Diagnosis not present

## 2023-08-12 DIAGNOSIS — E1165 Type 2 diabetes mellitus with hyperglycemia: Secondary | ICD-10-CM | POA: Diagnosis not present

## 2023-08-12 DIAGNOSIS — Z87891 Personal history of nicotine dependence: Secondary | ICD-10-CM | POA: Diagnosis not present

## 2023-08-12 DIAGNOSIS — I739 Peripheral vascular disease, unspecified: Secondary | ICD-10-CM

## 2023-08-12 DIAGNOSIS — Z8249 Family history of ischemic heart disease and other diseases of the circulatory system: Secondary | ICD-10-CM | POA: Diagnosis not present

## 2023-08-12 DIAGNOSIS — I252 Old myocardial infarction: Secondary | ICD-10-CM | POA: Diagnosis not present

## 2023-08-12 DIAGNOSIS — J9601 Acute respiratory failure with hypoxia: Secondary | ICD-10-CM | POA: Diagnosis not present

## 2023-08-12 DIAGNOSIS — Z83438 Family history of other disorder of lipoprotein metabolism and other lipidemia: Secondary | ICD-10-CM | POA: Diagnosis not present

## 2023-08-12 DIAGNOSIS — E785 Hyperlipidemia, unspecified: Secondary | ICD-10-CM | POA: Diagnosis not present

## 2023-08-12 DIAGNOSIS — Z951 Presence of aortocoronary bypass graft: Secondary | ICD-10-CM | POA: Diagnosis not present

## 2023-08-12 DIAGNOSIS — E876 Hypokalemia: Secondary | ICD-10-CM | POA: Diagnosis not present

## 2023-08-12 DIAGNOSIS — Z7902 Long term (current) use of antithrombotics/antiplatelets: Secondary | ICD-10-CM | POA: Diagnosis not present

## 2023-08-12 DIAGNOSIS — I509 Heart failure, unspecified: Secondary | ICD-10-CM | POA: Diagnosis not present

## 2023-08-12 DIAGNOSIS — R0989 Other specified symptoms and signs involving the circulatory and respiratory systems: Secondary | ICD-10-CM | POA: Diagnosis not present

## 2023-08-12 DIAGNOSIS — Z7982 Long term (current) use of aspirin: Secondary | ICD-10-CM | POA: Diagnosis not present

## 2023-08-12 DIAGNOSIS — K219 Gastro-esophageal reflux disease without esophagitis: Secondary | ICD-10-CM | POA: Diagnosis not present

## 2023-08-12 DIAGNOSIS — E1151 Type 2 diabetes mellitus with diabetic peripheral angiopathy without gangrene: Secondary | ICD-10-CM | POA: Diagnosis not present

## 2023-08-12 DIAGNOSIS — I5033 Acute on chronic diastolic (congestive) heart failure: Secondary | ICD-10-CM | POA: Diagnosis not present

## 2023-08-12 DIAGNOSIS — Z833 Family history of diabetes mellitus: Secondary | ICD-10-CM | POA: Diagnosis not present

## 2023-08-12 DIAGNOSIS — Z961 Presence of intraocular lens: Secondary | ICD-10-CM | POA: Diagnosis not present

## 2023-08-12 DIAGNOSIS — Z9181 History of falling: Secondary | ICD-10-CM | POA: Diagnosis not present

## 2023-08-12 DIAGNOSIS — R918 Other nonspecific abnormal finding of lung field: Secondary | ICD-10-CM | POA: Diagnosis not present

## 2023-08-12 DIAGNOSIS — Z794 Long term (current) use of insulin: Secondary | ICD-10-CM | POA: Diagnosis not present

## 2023-08-12 DIAGNOSIS — R0602 Shortness of breath: Secondary | ICD-10-CM | POA: Diagnosis not present

## 2023-08-12 DIAGNOSIS — I2510 Atherosclerotic heart disease of native coronary artery without angina pectoris: Secondary | ICD-10-CM | POA: Diagnosis not present

## 2023-08-12 DIAGNOSIS — Z7985 Long-term (current) use of injectable non-insulin antidiabetic drugs: Secondary | ICD-10-CM | POA: Diagnosis not present

## 2023-08-12 DIAGNOSIS — J984 Other disorders of lung: Secondary | ICD-10-CM | POA: Diagnosis not present

## 2023-08-12 LAB — CBC WITH DIFFERENTIAL/PLATELET
Basophils Absolute: 0 10*3/uL (ref 0.0–0.2)
Basos: 0 %
EOS (ABSOLUTE): 0.2 10*3/uL (ref 0.0–0.4)
Eos: 2 %
Hematocrit: 41.3 % (ref 34.0–46.6)
Hemoglobin: 13 g/dL (ref 11.1–15.9)
Immature Grans (Abs): 0 10*3/uL (ref 0.0–0.1)
Immature Granulocytes: 0 %
Lymphocytes Absolute: 2.5 10*3/uL (ref 0.7–3.1)
Lymphs: 30 %
MCH: 31.3 pg (ref 26.6–33.0)
MCHC: 31.5 g/dL (ref 31.5–35.7)
MCV: 100 fL — ABNORMAL HIGH (ref 79–97)
Monocytes Absolute: 0.5 10*3/uL (ref 0.1–0.9)
Monocytes: 6 %
Neutrophils Absolute: 5.1 10*3/uL (ref 1.4–7.0)
Neutrophils: 62 %
Platelets: 204 10*3/uL (ref 150–450)
RBC: 4.15 x10E6/uL (ref 3.77–5.28)
RDW: 14.3 % (ref 11.7–15.4)
WBC: 8.4 10*3/uL (ref 3.4–10.8)

## 2023-08-12 LAB — CMP14+EGFR
ALT: 15 IU/L (ref 0–32)
AST: 18 IU/L (ref 0–40)
Albumin: 4.1 g/dL (ref 3.8–4.8)
Alkaline Phosphatase: 70 IU/L (ref 44–121)
BUN/Creatinine Ratio: 15 (ref 12–28)
BUN: 11 mg/dL (ref 8–27)
Bilirubin Total: 0.4 mg/dL (ref 0.0–1.2)
CO2: 25 mmol/L (ref 20–29)
Calcium: 9.4 mg/dL (ref 8.7–10.3)
Chloride: 96 mmol/L (ref 96–106)
Creatinine, Ser: 0.75 mg/dL (ref 0.57–1.00)
Globulin, Total: 3 g/dL (ref 1.5–4.5)
Glucose: 185 mg/dL — ABNORMAL HIGH (ref 70–99)
Potassium: 4.1 mmol/L (ref 3.5–5.2)
Sodium: 139 mmol/L (ref 134–144)
Total Protein: 7.1 g/dL (ref 6.0–8.5)
eGFR: 85 mL/min/{1.73_m2} (ref >59)

## 2023-08-12 LAB — MICROALBUMIN / CREATININE URINE RATIO
Creatinine, Urine: 47.4 mg/dL
Microalb/Creat Ratio: 474 mg/g{creat} — ABNORMAL HIGH (ref 0–29)
Microalbumin, Urine: 224.7 ug/mL

## 2023-08-12 LAB — GLUCOSE, CAPILLARY: Glucose-Capillary: 185 mg/dL — ABNORMAL HIGH (ref 70–99)

## 2023-08-12 LAB — LIPID PANEL
Chol/HDL Ratio: 2.3 ratio (ref 0.0–4.4)
Cholesterol, Total: 141 mg/dL (ref 100–199)
HDL: 62 mg/dL (ref >39)
LDL Chol Calc (NIH): 61 mg/dL (ref 0–99)
Triglycerides: 101 mg/dL (ref 0–149)
VLDL Cholesterol Cal: 18 mg/dL (ref 5–40)

## 2023-08-12 LAB — VITAMIN B12: Vitamin B-12: 390 pg/mL (ref 232–1245)

## 2023-08-12 LAB — TSH: TSH: 1.31 u[IU]/mL (ref 0.450–4.500)

## 2023-08-12 NOTE — Progress Notes (Signed)
 Daily Session Note  Patient Details  Name: JAQUELYN SAKAMOTO MRN: [id number] Date of Birth: 25-May-1950 Referring Provider:   Conrad Row Supervised Exercise Therapy from 05/25/2023 in [facility]  Referring Provider Darron Grass MD    Encounter Date: 08/12/2023  Check In:  Session Check In - 08/12/23 1411       Check-In   Supervising physician immediately available to respond to emergencies See telemetry face sheet for immediately available MD    Location [facility]    Staff Present Powell Benders, BS, Exercise Physiologist;Ephrem Carrick Jackquline, BSN, RN, WTA-C    Virtual Visit No    Medication changes reported     No    Fall or balance concerns reported    No    Tobacco Cessation No Change    Warm-up and Cool-down Performed on first and last piece of equipment    Resistance Training Performed Yes    VAD Patient? No    PAD/SET Patient? Yes      PAD/SET Patient   Completed foot check today? Yes    Open wounds to report? No      Pain Assessment   Currently in Pain? No/denies          Capillary Blood Glucose: No results found. However, due to the size of the patient record, not all encounters were searched. Please check Results Review for a complete set of results.    Social History   Tobacco Use  Smoking Status Former   Current packs/day: 0.00   Average packs/day: 1 pack/day for 36.8 years (36.8 ttl pk-yrs)   Types: Cigarettes   Start date: 04/21/1964   Quit date: 02/17/2001   Years since quitting: 22.4  Smokeless Tobacco Never    Goals Met:  Proper associated with RPD/PD & O2 Sat Independence with exercise equipment Improved SOB with ADL's Using PLB without cueing & demonstrates good technique Exercise tolerated well No report of concerns or symptoms today Strength training completed today  Goals Unmet:  Not Applicable  Comments: Pt able to follow exercise prescription today without complaint.  Will continue to  monitor for progression.

## 2023-08-13 ENCOUNTER — Ambulatory Visit: Payer: Self-pay | Admitting: Family

## 2023-08-13 ENCOUNTER — Encounter (HOSPITAL_COMMUNITY)

## 2023-08-13 DIAGNOSIS — R809 Proteinuria, unspecified: Secondary | ICD-10-CM

## 2023-08-13 DIAGNOSIS — E1151 Type 2 diabetes mellitus with diabetic peripheral angiopathy without gangrene: Secondary | ICD-10-CM

## 2023-08-13 MED ORDER — DAPAGLIFLOZIN PROPANEDIOL 10 MG PO TABS: 10.0000 mg | ORAL_TABLET | Freq: Every day | ORAL | 1 refills | Status: DC

## 2023-08-14 ENCOUNTER — Encounter (HOSPITAL_COMMUNITY)

## 2023-08-14 ENCOUNTER — Telehealth: Payer: Self-pay

## 2023-08-14 ENCOUNTER — Ambulatory Visit: Payer: Self-pay

## 2023-08-14 NOTE — Telephone Encounter (Signed)
 FYI Only or Action Required?: FYI only for provider.  Patient was last seen in primary care on 08/11/2023 by Lavell Bari LABOR, FNP. Called Nurse Triage reporting Shortness of Breath. Symptoms began 6/20. Interventions attempted: Other: Monitoring O2 level. Symptoms are: resolved during triage call.  Triage Disposition: See PCP When Office is Open (Within 3 Days)  Patient/caregiver understands and will follow disposition?: Yes  **Please see note below**                     Copied from CRM [id number]. Topic: Clinical - Red Word Triage >> Aug 14, 2023 10:02 AM Elle L wrote: Red Word that prompted transfer to Nurse Triage: The patient called for her lab results which I relayed verbatim and a prescription issue that I have sent to the office. However, the patient states that she feels weak and her oxygen is 86-88 and she is having difficulty breathing. Reason for Disposition  [1] MODERATE longstanding difficulty breathing (e.g., speaks in phrases, SOB even at rest, pulse 100-120) AND [2] SAME as normal  Answer Assessment - Initial Assessment Questions 1. RESPIRATORY STATUS: Describe your breathing? (e.g., wheezing, shortness of breath, unable to speak, severe coughing)      SOB  2. ONSET: When did this breathing problem begin?      6/20 intermittently   3. PATTERN Does the difficult breathing come and go, or has it been constant since it started?      Intermittent   4. SEVERITY: How bad is your breathing? (e.g., mild, moderate, severe)    - MILD: No SOB at rest, mild SOB with walking, speaks normally in sentences, can lie down, no retractions, pulse < 100.    - MODERATE: SOB at rest, SOB with minimal exertion and prefers to sit, cannot lie down flat, speaks in phrases, mild retractions, audible wheezing, pulse 100-120.    - SEVERE: Very SOB at rest, speaks in single words, struggling to breathe, sitting hunched forward, retractions, pulse > 120       Mild when  sitting, ambulating moderate.   5. RECURRENT SYMPTOM: Have you had difficulty breathing before? If Yes, ask: When was the last time? and What happened that time?     Yes  6. CARDIAC HISTORY: Do you have any history of heart disease? (e.g., heart attack, angina, bypass surgery, angioplasty)      Yes  7. LUNG HISTORY: Do you have any history of lung disease?  (e.g., pulmonary embolus, asthma, emphysema)     Fluid in lungs prior   8. CAUSE: What do you think is causing the breathing problem?      Unknown  9. OTHER SYMPTOMS: Do you have any other symptoms? (e.g., dizziness, runny nose, cough, chest pain, fever)    No  10. O2 SATURATION MONITOR:  Do you use an oxygen saturation monitor (pulse oximeter) at home? If Yes, ask: What is your reading (oxygen level) today? What is your usual oxygen saturation reading? (e.g., 95%)           86-88%        She is complaining of weakness, wheezing at night. During the call O2 improved to 91-93%, SOB resolved. Patient stated that she will call for EMS if O2 continues to drop below 90%. She declined an appt. But agrees if symptoms worsen/persist she will call EMS.  Protocols used: Breathing Difficulty-A-AH

## 2023-08-14 NOTE — Telephone Encounter (Signed)
 Copied from CRM [id number]. Topic: Clinical - Prescription Issue >> Aug 14, 2023  9:58 AM Elle L wrote: Reason for CRM:  The patient called back regarding her lab results. I read the note verbatim and she expressed understanding. However, she advised that the dapagliflozin  propanediol (FARXIGA ) 10 MG TABS tablet is $350 and is requesting a call back from [facility] regarding this to see if a replacement medication could be called in.

## 2023-08-15 ENCOUNTER — Inpatient Hospital Stay (HOSPITAL_COMMUNITY)
Admission: EM | Admit: 2023-08-15 | Discharge: 2023-08-19 | DRG: 291 | Disposition: A | Discharge status: Home / Self Care | Attending: Internal Medicine | Admitting: Internal Medicine

## 2023-08-15 ENCOUNTER — Other Ambulatory Visit: Payer: Self-pay

## 2023-08-15 ENCOUNTER — Encounter (HOSPITAL_COMMUNITY): Payer: Self-pay

## 2023-08-15 ENCOUNTER — Emergency Department (HOSPITAL_COMMUNITY)

## 2023-08-15 DIAGNOSIS — Z7985 Long-term (current) use of injectable non-insulin antidiabetic drugs: Secondary | ICD-10-CM

## 2023-08-15 DIAGNOSIS — Z7902 Long term (current) use of antithrombotics/antiplatelets: Secondary | ICD-10-CM | POA: Diagnosis not present

## 2023-08-15 DIAGNOSIS — R0602 Shortness of breath: Secondary | ICD-10-CM | POA: Diagnosis not present

## 2023-08-15 DIAGNOSIS — Z6835 Body mass index (BMI) 35.0-35.9, adult: Secondary | ICD-10-CM | POA: Diagnosis not present

## 2023-08-15 DIAGNOSIS — J984 Other disorders of lung: Secondary | ICD-10-CM | POA: Diagnosis not present

## 2023-08-15 DIAGNOSIS — E876 Hypokalemia: Secondary | ICD-10-CM | POA: Diagnosis present

## 2023-08-15 DIAGNOSIS — I5033 Acute on chronic diastolic (congestive) heart failure: Secondary | ICD-10-CM | POA: Diagnosis present

## 2023-08-15 DIAGNOSIS — Z87891 Personal history of nicotine dependence: Secondary | ICD-10-CM

## 2023-08-15 DIAGNOSIS — Z961 Presence of intraocular lens: Secondary | ICD-10-CM | POA: Diagnosis present

## 2023-08-15 DIAGNOSIS — E1165 Type 2 diabetes mellitus with hyperglycemia: Secondary | ICD-10-CM | POA: Diagnosis present

## 2023-08-15 DIAGNOSIS — E785 Hyperlipidemia, unspecified: Secondary | ICD-10-CM | POA: Diagnosis present

## 2023-08-15 DIAGNOSIS — I509 Heart failure, unspecified: Secondary | ICD-10-CM | POA: Diagnosis not present

## 2023-08-15 DIAGNOSIS — E66812 Obesity, class 2: Secondary | ICD-10-CM | POA: Diagnosis present

## 2023-08-15 DIAGNOSIS — I2510 Atherosclerotic heart disease of native coronary artery without angina pectoris: Secondary | ICD-10-CM | POA: Diagnosis present

## 2023-08-15 DIAGNOSIS — R918 Other nonspecific abnormal finding of lung field: Secondary | ICD-10-CM | POA: Diagnosis not present

## 2023-08-15 DIAGNOSIS — I25708 Atherosclerosis of coronary artery bypass graft(s), unspecified, with other forms of angina pectoris: Secondary | ICD-10-CM | POA: Diagnosis not present

## 2023-08-15 DIAGNOSIS — J9601 Acute respiratory failure with hypoxia: Secondary | ICD-10-CM | POA: Diagnosis present

## 2023-08-15 DIAGNOSIS — Z7982 Long term (current) use of aspirin: Secondary | ICD-10-CM | POA: Diagnosis not present

## 2023-08-15 DIAGNOSIS — I10 Essential (primary) hypertension: Secondary | ICD-10-CM | POA: Diagnosis not present

## 2023-08-15 DIAGNOSIS — K219 Gastro-esophageal reflux disease without esophagitis: Secondary | ICD-10-CM | POA: Diagnosis present

## 2023-08-15 DIAGNOSIS — Z833 Family history of diabetes mellitus: Secondary | ICD-10-CM

## 2023-08-15 DIAGNOSIS — I252 Old myocardial infarction: Secondary | ICD-10-CM

## 2023-08-15 DIAGNOSIS — Z83438 Family history of other disorder of lipoprotein metabolism and other lipidemia: Secondary | ICD-10-CM

## 2023-08-15 DIAGNOSIS — Z9181 History of falling: Secondary | ICD-10-CM | POA: Diagnosis not present

## 2023-08-15 DIAGNOSIS — I110 Hypertensive heart disease with heart failure: Principal | ICD-10-CM | POA: Diagnosis present

## 2023-08-15 DIAGNOSIS — Z9109 Other allergy status, other than to drugs and biological substances: Secondary | ICD-10-CM

## 2023-08-15 DIAGNOSIS — Z955 Presence of coronary angioplasty implant and graft: Secondary | ICD-10-CM

## 2023-08-15 DIAGNOSIS — E1151 Type 2 diabetes mellitus with diabetic peripheral angiopathy without gangrene: Secondary | ICD-10-CM | POA: Diagnosis present

## 2023-08-15 DIAGNOSIS — Z951 Presence of aortocoronary bypass graft: Secondary | ICD-10-CM | POA: Diagnosis not present

## 2023-08-15 DIAGNOSIS — R0989 Other specified symptoms and signs involving the circulatory and respiratory systems: Secondary | ICD-10-CM | POA: Diagnosis not present

## 2023-08-15 DIAGNOSIS — Z8249 Family history of ischemic heart disease and other diseases of the circulatory system: Secondary | ICD-10-CM | POA: Diagnosis not present

## 2023-08-15 DIAGNOSIS — Z79899 Other long term (current) drug therapy: Secondary | ICD-10-CM

## 2023-08-15 DIAGNOSIS — Z794 Long term (current) use of insulin: Secondary | ICD-10-CM

## 2023-08-15 DIAGNOSIS — Z9842 Cataract extraction status, left eye: Secondary | ICD-10-CM

## 2023-08-15 LAB — CBC
HCT: 39.1 % (ref 36.0–46.0)
Hemoglobin: 12.3 g/dL (ref 12.0–15.0)
MCH: 30.4 pg (ref 26.0–34.0)
MCHC: 31.5 g/dL (ref 30.0–36.0)
MCV: 96.5 fL (ref 80.0–100.0)
Platelets: 196 10*3/uL (ref 150–400)
RBC: 4.05 MIL/uL (ref 3.87–5.11)
RDW: 15.2 % (ref 11.5–15.5)
WBC: 9.2 10*3/uL (ref 4.0–10.5)
nRBC: 0 % (ref 0.0–0.2)

## 2023-08-15 LAB — HEPATIC FUNCTION PANEL
ALT: 19 U/L (ref 0–44)
AST: 18 U/L (ref 15–41)
Albumin: 3.5 g/dL (ref 3.5–5.0)
Alkaline Phosphatase: 63 U/L (ref 38–126)
Bilirubin, Direct: 0.1 mg/dL (ref 0.0–0.2)
Indirect Bilirubin: 0.6 mg/dL (ref 0.3–0.9)
Total Bilirubin: 0.7 mg/dL (ref 0.0–1.2)
Total Protein: 7.7 g/dL (ref 6.5–8.1)

## 2023-08-15 LAB — BRAIN NATRIURETIC PEPTIDE: B Natriuretic Peptide: 269 pg/mL — ABNORMAL HIGH (ref 0.0–100.0)

## 2023-08-15 LAB — BASIC METABOLIC PANEL WITH GFR
Anion gap: 11 (ref 5–15)
BUN: 12 mg/dL (ref 8–23)
CO2: 31 mmol/L (ref 22–32)
Calcium: 9.2 mg/dL (ref 8.9–10.3)
Chloride: 97 mmol/L — ABNORMAL LOW (ref 98–111)
Creatinine, Ser: 0.71 mg/dL (ref 0.44–1.00)
GFR, Estimated: >60 mL/min (ref >60)
Glucose, Bld: 237 mg/dL — ABNORMAL HIGH (ref 70–99)
Potassium: 3.6 mmol/L (ref 3.5–5.1)
Sodium: 139 mmol/L (ref 135–145)

## 2023-08-15 LAB — GLUCOSE, CAPILLARY: Glucose-Capillary: 266 mg/dL — ABNORMAL HIGH (ref 70–99)

## 2023-08-15 LAB — TROPONIN I (HIGH SENSITIVITY)
Troponin I (High Sensitivity): 19 ng/L — ABNORMAL HIGH (ref <18)
Troponin I (High Sensitivity): 19 ng/L — ABNORMAL HIGH (ref <18)

## 2023-08-15 MED ORDER — INSULIN GLARGINE-YFGN 100 UNIT/ML ~~LOC~~ SOLN
20.0000 [IU] | Freq: Every day | SUBCUTANEOUS | Status: DC
  Administered 2023-08-15 – 2023-08-16 (×2): 20 [IU] via SUBCUTANEOUS
  Filled 2023-08-15 (×3): qty 0.2

## 2023-08-15 MED ORDER — INSULIN ASPART 100 UNIT/ML IJ SOLN
0.0000 [IU] | Freq: Three times a day (TID) | INTRAMUSCULAR | Status: DC
  Administered 2023-08-16: 5 [IU] via SUBCUTANEOUS
  Administered 2023-08-16: 3 [IU] via SUBCUTANEOUS
  Administered 2023-08-16: 8 [IU] via SUBCUTANEOUS
  Administered 2023-08-17: 5 [IU] via SUBCUTANEOUS
  Administered 2023-08-17: 3 [IU] via SUBCUTANEOUS
  Administered 2023-08-17: 5 [IU] via SUBCUTANEOUS
  Administered 2023-08-18: 11 [IU] via SUBCUTANEOUS
  Administered 2023-08-18 – 2023-08-19 (×3): 5 [IU] via SUBCUTANEOUS
  Administered 2023-08-19: 8 [IU] via SUBCUTANEOUS

## 2023-08-15 MED ORDER — ONDANSETRON HCL 4 MG PO TABS: 4.0000 mg | ORAL_TABLET | Freq: Four times a day (QID) | ORAL | Status: DC | PRN

## 2023-08-15 MED ORDER — POTASSIUM CHLORIDE CRYS ER 20 MEQ PO TBCR
40.0000 meq | EXTENDED_RELEASE_TABLET | Freq: Two times a day (BID) | ORAL | Status: DC
  Administered 2023-08-15 – 2023-08-19 (×8): 40 meq via ORAL
  Filled 2023-08-15 (×8): qty 2

## 2023-08-15 MED ORDER — ASPIRIN 81 MG PO TBEC
81.0000 mg | DELAYED_RELEASE_TABLET | Freq: Every day | ORAL | Status: DC
  Administered 2023-08-16 – 2023-08-19 (×4): 81 mg via ORAL
  Filled 2023-08-15 (×4): qty 1

## 2023-08-15 MED ORDER — FUROSEMIDE 10 MG/ML IJ SOLN
40.0000 mg | Freq: Once | INTRAMUSCULAR | Status: AC
  Administered 2023-08-15: 40 mg via INTRAVENOUS
  Filled 2023-08-15: qty 4

## 2023-08-15 MED ORDER — FUROSEMIDE 10 MG/ML IJ SOLN
60.0000 mg | Freq: Two times a day (BID) | INTRAMUSCULAR | Status: DC
  Administered 2023-08-15 – 2023-08-19 (×8): 60 mg via INTRAVENOUS
  Filled 2023-08-15 (×8): qty 6

## 2023-08-15 MED ORDER — CLOPIDOGREL BISULFATE 75 MG PO TABS
75.0000 mg | ORAL_TABLET | Freq: Every day | ORAL | Status: DC
  Administered 2023-08-16 – 2023-08-19 (×4): 75 mg via ORAL
  Filled 2023-08-15 (×4): qty 1

## 2023-08-15 MED ORDER — MONTELUKAST SODIUM 10 MG PO TABS
10.0000 mg | ORAL_TABLET | Freq: Every day | ORAL | Status: DC
  Administered 2023-08-16 – 2023-08-19 (×4): 10 mg via ORAL
  Filled 2023-08-15 (×4): qty 1

## 2023-08-15 MED ORDER — OXYBUTYNIN CHLORIDE ER 5 MG PO TB24
5.0000 mg | ORAL_TABLET | Freq: Every day | ORAL | Status: DC
  Administered 2023-08-15 – 2023-08-18 (×4): 5 mg via ORAL
  Filled 2023-08-15 (×4): qty 1

## 2023-08-15 MED ORDER — PANTOPRAZOLE SODIUM 40 MG PO TBEC
40.0000 mg | DELAYED_RELEASE_TABLET | Freq: Every day | ORAL | Status: DC
  Administered 2023-08-16 – 2023-08-19 (×4): 40 mg via ORAL
  Filled 2023-08-15 (×6): qty 1

## 2023-08-15 MED ORDER — INSULIN LISPRO 200 UNIT/ML ~~LOC~~ SOPN: 15.0000 [IU] | PEN_INJECTOR | Freq: Three times a day (TID) | SUBCUTANEOUS | Status: DC

## 2023-08-15 MED ORDER — CARVEDILOL 12.5 MG PO TABS
25.0000 mg | ORAL_TABLET | Freq: Two times a day (BID) | ORAL | Status: DC
  Administered 2023-08-15 – 2023-08-19 (×6): 25 mg via ORAL
  Filled 2023-08-15 (×7): qty 2

## 2023-08-15 MED ORDER — AMLODIPINE BESYLATE 5 MG PO TABS
10.0000 mg | ORAL_TABLET | Freq: Every day | ORAL | Status: DC
  Administered 2023-08-16 – 2023-08-17 (×2): 10 mg via ORAL
  Filled 2023-08-15 (×3): qty 2

## 2023-08-15 MED ORDER — INSULIN DEGLUDEC 100 UNIT/ML ~~LOC~~ SOPN: 20.0000 [IU] | PEN_INJECTOR | Freq: Every day | SUBCUTANEOUS | Status: DC

## 2023-08-15 MED ORDER — INSULIN ASPART 100 UNIT/ML IJ SOLN
0.0000 [IU] | Freq: Every day | INTRAMUSCULAR | Status: DC
  Administered 2023-08-15 – 2023-08-17 (×3): 3 [IU] via SUBCUTANEOUS
  Administered 2023-08-18: 2 [IU] via SUBCUTANEOUS

## 2023-08-15 MED ORDER — ONDANSETRON HCL 4 MG/2ML IJ SOLN: 4.0000 mg | Freq: Four times a day (QID) | INTRAMUSCULAR | Status: DC | PRN

## 2023-08-15 MED ORDER — ALBUTEROL SULFATE (2.5 MG/3ML) 0.083% IN NEBU: 2.5000 mg | INHALATION_SOLUTION | Freq: Four times a day (QID) | RESPIRATORY_TRACT | Status: DC | PRN

## 2023-08-15 MED ORDER — ENOXAPARIN SODIUM 40 MG/0.4ML IJ SOSY
40.0000 mg | PREFILLED_SYRINGE | INTRAMUSCULAR | Status: DC
  Administered 2023-08-15 – 2023-08-18 (×4): 40 mg via SUBCUTANEOUS
  Filled 2023-08-15 (×4): qty 0.4

## 2023-08-15 MED ORDER — ROSUVASTATIN CALCIUM 20 MG PO TABS
40.0000 mg | ORAL_TABLET | Freq: Every day | ORAL | Status: DC
  Administered 2023-08-15 – 2023-08-19 (×5): 40 mg via ORAL
  Filled 2023-08-15 (×5): qty 2

## 2023-08-15 NOTE — ED Notes (Signed)
 Nasal cannula applied at 2 lpm d/t pt room air was 89% 91 % ra.

## 2023-08-15 NOTE — ED Notes (Signed)
 Spoke to transport and they will take patient to room 339 soon

## 2023-08-15 NOTE — ED Notes (Addendum)
 ED TO INPATIENT HANDOFF REPORT  ED Nurse Name and Phone #: Philippe Collet RN  S Name/Age/Gender Leah Ferrell 73 y.o. female Room/Bed: APA04/APA04  Code Status   Code Status: Prior  Home/SNF/Other Home Patient oriented to: self, place, time, and situation Is this baseline? Yes   Triage Complete: Triage complete  Chief Complaint Acute respiratory failure with hypoxia (HCC) [J96.01]  Triage Note Pt reports:  Sob Concerned about low oxygen level Worsening over last 2-3 weeks Leg swelling Ongoing worsening Overall weight gain    Allergies Allergies  Allergen Reactions   Tape Rash    Level of Care/Admitting Diagnosis ED Disposition     ED Disposition  Admit   Condition  --   Comment  Hospital Area: [facility] [[id number]]  Level of Care: Telemetry [5]  Covid Evaluation: Asymptomatic - no recent exposure (last 10 days) testing not required  Diagnosis: Acute respiratory failure with hypoxia [facility]) [[id number]]  Admitting Physician: STINSON, JACOB J [[id number]]  Attending Physician: STINSON, JACOB J [[id number]]  Certification:: I certify this patient will need inpatient services for at least 2 midnights  Expected Medical Readiness: 08/19/2023          B Medical/Surgery History Past Medical History:  Diagnosis Date   CAD (coronary artery disease)    a. 04/2012 NSTEMI: s/p DES to LAD.   Chronic diastolic CHF (congestive heart failure) (HCC)    Diabetes mellitus without complication (HCC)    Dysrhythmia    Environmental and seasonal allergies    Hyperlipidemia    Hypertension    Leukocytosis    Morbid obesity (HCC)    NSTEMI (non-ST elevated myocardial infarction) (HCC) 04/27/2012   PONV (postoperative nausea and vomiting)    PVD (peripheral vascular disease) (HCC)    a. 04/2012 ABI: R 0.49, L 0.39. Angiography 06/14: Significant ostial left common iliac artery stenosis extending into the distal aorta, severe left common femoral artery stenosis, bilateral SFA  occlusion with heavy calcifications. Functionally, one-vessel runoff bilaterally below the knee   Renal artery stenosis (HCC)    s/p angioplasty/stenting bilateral renal arteries 03/16/19   Shortness of breath    Past Surgical History:  Procedure Laterality Date   ABDOMINAL AORTAGRAM N/A 07/21/2012   Procedure: ABDOMINAL EZELLA;  Surgeon: Deatrice DELENA Cage, MD;  Location: [facility] CATH LAB;  Service: Cardiovascular;  Laterality: N/A;   ABDOMINAL AORTOGRAM W/LOWER EXTREMITY N/A 05/09/2020   Procedure: ABDOMINAL AORTOGRAM W/LOWER EXTREMITY;  Surgeon: Cage Deatrice DELENA, MD;  Location: [facility] INVASIVE CV LAB;  Service: Cardiovascular;  Laterality: N/A;   CARDIAC CATHETERIZATION     stents X 2   CATARACT EXTRACTION W/PHACO Left 09/08/2022   Procedure: CATARACT EXTRACTION PHACO AND INTRAOCULAR LENS PLACEMENT (IOC) with placement of Corticosteroid;  Surgeon: Harrie Agent, MD;  Location: [facility] ORS;  Service: Ophthalmology;  Laterality: Left;  CDE 8.48   CORONARY ANGIOGRAM  04/27/2012   Procedure: CORONARY ANGIOGRAM;  Surgeon: Aleene JINNY Passe, MD;  Location: [facility] CATH LAB;  Service: Cardiovascular;;   CORONARY ARTERY BYPASS GRAFT N/A 06/25/2020   Procedure: CORONARY ARTERY BYPASS GRAFTING (CABG)X3. LEFT INTERNAL MAMMARY ARTERY. RIGHT ENDOSCOPIC SAPHENOUS VEIN HARVESTING.;  Surgeon: German Bartlett PEDLAR, MD;  Location: [facility] OR;  Service: Open Heart Surgery;  Laterality: N/A;   CORONARY STENT INTERVENTION N/A 04/08/2023   Procedure: CORONARY STENT INTERVENTION;  Surgeon: Cage Deatrice DELENA, MD;  Location: [facility] INVASIVE CV LAB;  Service: Cardiovascular;  Laterality: N/A;  RCA   ENDARTERECTOMY FEMORAL Left 11/02/2013   Procedure: RESECTION LEFT COMMON  FEMORAL ARTERY AND INSERTION OF INTERPOSITION 7mm HEMASHIELD GRAFT FROM LEFT EXTERNAL  ILIAC ARTERY TO LEFT PROFUNDA  FEMORIS ARTERY;  Surgeon: Lynwood JONETTA Collum, MD;  Location: [facility] OR;  Service: Vascular;  Laterality: Left;   EYE SURGERY Right 2017   FRACTURE SURGERY Right Jul 07, 2014    Right Foot-  Pt.fell  at Home  by Heat duct   ILIAC ARTERY STENT Left 08/31/2013   LAD stent     LEFT HEART CATH AND CORONARY ANGIOGRAPHY N/A 05/09/2020   Procedure: LEFT HEART CATH AND CORONARY ANGIOGRAPHY;  Surgeon: Darron Deatrice LABOR, MD;  [facility]: [facility] INVASIVE CV LAB;  Service: Cardiovascular;  Laterality: N/A;   LEFT HEART CATH AND CORS/GRAFTS ANGIOGRAPHY N/A 04/08/2023   Procedure: LEFT HEART CATH AND CORS/GRAFTS ANGIOGRAPHY;  Surgeon: Darron Deatrice LABOR, MD;  [facility] INVASIVE CV LAB;  Service: Cardiovascular;  Laterality: N/A;   LEFT HEART CATHETERIZATION WITH CORONARY ANGIOGRAM N/A 04/23/2012   Procedure: LEFT HEART CATHETERIZATION WITH CORONARY ANGIOGRAM;  Surgeon: Peter M Swaziland, MD;  Location: [name] CATH LAB;  Service: Cardiovascular;  Laterality: N/A;   LOWER EXTREMITY ANGIOGRAM Left 08/31/2013   Procedure: LOWER EXTREMITY ANGIOGRAM;  Surgeon: Deatrice LABOR Darron, MD;  [facility]: [facility] CATH [facility];  Service: Cardiovascular;  Laterality: Left;   PERCUTANEOUS STENT INTERVENTION Left 08/31/2013   Procedure: PERCUTANEOUS STENT INTERVENTION;  Surgeon: Deatrice LABOR Darron, MD;  [facility]: [facility] CATH LAB;  Service: Cardiovascular;  Laterality: Left;  Common Iliac artery   PERIPHERAL VASCULAR CATHETERIZATION N/A 03/19/2016   Procedure: Abdominal Aortogram w/Lower Extremity;  Surgeon: Deatrice LABOR Darron, MD;  [facility] INV[facility] CV LAB;  Service: Cardiovascular;  Laterality: N/A;   PERIPHERAL VASCULAR CATHETERIZATION  03/19/2016   Procedure: Peripheral Vascular Balloon Angioplasty-CFA Left;  Surgeon: Deatrice LABOR Darron, MD;  [facility] IN[facility] CV LAB;  Service: Cardiovascular;;   PERIPHERAL VASCULAR INTERVENTION Bilateral 03/16/2019   Procedure: PERIPHERAL VASCULAR INTERVENTION;  Surgeon: Darron Deatrice LABOR, MD;  [facility] CV LAB;  Service: Cardiovascular;  Laterality: Bilateral;  RENAL   PERIPHERAL VASCULAR INTERVENTION Bilateral 05/09/2020   Procedure: PERIPHERAL VASCULAR INTERVENTION;  Surgeon: Darron Deatrice LABOR, MD;  [facility] CV LAB;  Service: Cardiovascular;  Laterality: Bilateral;  Iliac   RENAL ANGIOGRAPHY  03/16/2019   RENAL ANGIOGRAPHY Bilateral 03/16/2019   Procedure: RENAL ANGIOGRAPHY;  Surgeon: Darron Deatrice LABOR, MD;  [facility] INV[facility] CV LAB;  Service: Cardiovascular;  Laterality: Bilateral;   RENAL ANGIOGRAPHY Bilateral 04/08/2023   Procedure: RENAL ANGIOGRAPHY;  Surgeon: Darron Deatrice LABOR, MD;  [facility] INVAS[facility] CV LAB;  Service: Cardiovascular;  Laterality: Bilateral;   TEE WITHOUT CARDIOVERSION N/A 06/25/2020   Procedure: TRANSESOPHAGEAL ECHOCARDIOGRAM (TEE);  Surgeon: German Bartlett PEDLAR, MD;  Location: [facility] OR;  Service: Open Heart Surgery;  Laterality: N/A;   TUMOR REMOVAL     tumor removed from [place]     A IV Location/Drains/Wounds Patient Lines/Drains/Airways Status     Active Line/Drains/Airways     Name Placement date Placement time Site Days   Peripheral IV 08/15/23 20 G Anterior;Right Forearm 08/15/23  1502  Forearm  less than 1            Intake/Output Last 24 hours  Intake/Output Summary (Last 24 hours) at 08/15/2023 1823 Last data filed at 08/15/2023 1822 Gross per 24 hour  Intake 240 ml  Output --  Net 240 ml    Labs/Imaging Results for orders placed or performed during the hospital encounter of 08/15/23 (from the past 48 hours)  Brain natriuretic peptide     Status: Abnormal   Collection Time: 08/15/23  2:21 PM  Result Value Ref Range   B Natriuretic Peptide 269.0 (H) 0.0 - 100.0 pg/mL    Comment: Performed at [facility], [address] [place], [place] [address]  Basic metabolic panel     Status: Abnormal   Collection Time: 08/15/23  2:22 PM  Result Value Ref Range   Sodium 139 135 - 145 mmol/L   Potassium 3.6 3.5 - 5.1 mmol/L   Chloride 97 (L) 98 - 111 mmol/L   CO2 31 22 - 32 mmol/L   Glucose, Bld 237 (H) 70 - 99 mg/dL    Comment: Glucose reference range applies only to samples taken after fasting for at least 8 hours.    BUN 12 8 - 23 mg/dL   Creatinine, Ser 9.28 0.44 - 1.00 mg/dL   Calcium  9.2 8.9 - 10.3 mg/dL   GFR, Estimated >39 >39 mL/min    Comment: (NOTE) Calculated using the CKD-EPI Creatinine Equation (2021)    Anion gap 11 5 - 15    Comment: Performed at [facility], [address], [place], [place] [address]  CBC     Status: None   Collection Time: 08/15/23  2:22 PM  Result Value Ref Range   WBC 9.2 4.0 - 10.5 K/uL   RBC 4.05 3.87 - 5.11 MIL/uL   Hemoglobin 12.3 12.0 - 15.0 g/dL   HCT 60.8 63.9 - 53.9 %   MCV 96.5 80.0 - 100.0 fL   MCH 30.4 26.0 - 34.0 pg   MCHC 31.5 30.0 - 36.0 g/dL   RDW 84.7 88.4 - 84.4 %   Platelets 196 150 - 400 K/uL   nRBC 0.0 0.0 - 0.2 %    Comment: Performed at [facility], [address], [place], [place] [address]  Hepatic function panel     Status: None   Collection Time: 08/15/23  2:35 PM  Result Value Ref Range   Total Protein 7.7 6.5 - 8.1 g/dL   Albumin  3.5 3.5 - 5.0 g/dL   AST 18 15 - 41 U/L   ALT 19 0 - 44 U/L   Alkaline Phosphatase 63 38 - 126 U/L   Total Bilirubin 0.7 0.0 - 1.2 mg/dL   Bilirubin, Direct 0.1 0.0 - 0.2 mg/dL   Indirect Bilirubin 0.6 0.3 - 0.9 mg/dL    Comment: Performed at [facility], [address], [place], [place] [address]  Troponin I (High Sensitivity)     Status: Abnormal   Collection Time: 08/15/23  2:35 PM  Result Value Ref Range   Troponin I (High Sensitivity) 19 (H) <18 ng/L    Comment: (NOTE) Elevated high sensitivity troponin I (hsTnI) values and significant  changes across serial measurements may suggest ACS but many other  chronic and acute conditions are known to elevate hsTnI results.  Refer to the Links section for chest pain algorithms and additional  guidance. Performed at [facility], [address], [place], [place] [address]   Troponin I (High Sensitivity)     Status: Abnormal   Collection Time: 08/15/23  4:33 PM  Result Value Ref Range   Troponin I (High Sensitivity) 19 (H) <18 ng/L    Comment:  (NOTE) Elevated high sensitivity troponin I (hsTnI) values and significant  changes across serial measurements may suggest ACS but many other  chronic and acute conditions are known to elevate hsTnI results.  Refer to the Links section for chest  pain algorithms and additional  guidance. Performed at [facility], [address], [place], [place] [address]    *Note: Due to a large number of results and/or encounters for the requested time period, some results have not been displayed. A complete set of results can be found in Results Review.   DG Chest 2 View Result Date: 08/15/2023 CLINICAL DATA:  Shortness of breath. EXAM: CHEST - 2 VIEW COMPARISON:  06/05/2023. FINDINGS: The heart is enlarged the mediastinal contour stable. There is atherosclerotic calcification of the aorta. The pulmonary vasculature is distended. Interstitial prominence is present bilaterally and mild airspace disease is present at the left lung base. There are suspected trace bilateral pleural effusions. No pneumothorax is seen. Sternotomy wires are present. Degenerative changes are present in the thoracic spine. IMPRESSION: 1. Cardiomegaly with pulmonary vascular congestion. 2. Interstitial prominence bilaterally with mild airspace disease at the left lung base, possible edema or infiltrate. 3. Suspected trace bilateral pleural effusions. Electronically Signed   By: Leita Birmingham M.D.   On: 08/15/2023 14:27    Pending Labs Unresulted Labs (From admission, onward)    None       Vitals/Pain Today's Vitals   08/15/23 1502 08/15/23 1530 08/15/23 1600 08/15/23 1730  BP:  (!) 165/75 (!) 178/50 (!) 177/47  Pulse: (!) 54 (!) 56 (!) 54 (!) 57  Resp: 17 19 (!) 21   Temp:      SpO2: 97% 96% 97% 95%  Weight:      Height:      PainSc:        Isolation Precautions No active isolations  Medications Medications  carvedilol  (COREG ) tablet 25 mg (25 mg Oral Given 08/15/23 1619)  rosuvastatin  (CRESTOR ) tablet 40 mg (40 mg  Oral Given 08/15/23 1617)  furosemide  (LASIX ) injection 40 mg (40 mg Intravenous Given 08/15/23 1617)    Mobility walks with person assist     Focused Assessments Pulmonary Assessment Handoff:  Lung sounds:   O2 Device: Nasal Cannula O2 Flow Rate (L/min): 2 L/min    R Recommendations: See Admitting Provider Note  Report given to:  Kim nurse 339 at [phone]  Additional Notes:

## 2023-08-15 NOTE — ED Notes (Signed)
 Still waiting for patient to actually make it to the room as it is documented. Pt has not been in this room for the last 10 minutes even though it's documented.

## 2023-08-15 NOTE — ED Notes (Signed)
Pt can eat

## 2023-08-15 NOTE — ED Provider Notes (Signed)
  EMERGENCY DEPARTMENT AT [facility] Provider Note   CSN: [id number] Arrival date & time: 08/15/23  1332     Patient presents with: Shortness of Breath and Leg Swelling   Iliani CATE ORAVEC is a 73 y.o. female past medical history significant for CHF, hypertension, diabetes, NSTEMI, and GERD presents today for shortness of breath.  Patient states she feels her oxygen has been low and is worsened over the last 2 to 3 weeks.  Patient also reports worsening of her leg swelling and overall weight gain.  Patient denies chest pain, fever, chills, nausea, vomiting, headache, cough, or congestion.    Shortness of Breath      Prior to Admission medications   Medication Sig Start Date End Date Taking? Authorizing Provider  acetaminophen  (TYLENOL ) 325 MG tablet Take 2 tablets (650 mg total) by mouth every 6 (six) hours as needed for mild pain (pain score 1-3) (or Fever >/= 101). 06/08/23  Yes Emokpae, Courage, MD  albuterol  (VENTOLIN  HFA) 108 (90 Base) MCG/ACT inhaler Inhale 2 puffs into the lungs every 6 (six) hours as needed for shortness of breath or wheezing. 06/08/23  Yes Emokpae, Courage, MD  amLODipine  (NORVASC ) 10 MG tablet Take 1 tablet (10 mg total) by mouth daily. 06/08/23 09/06/23 Yes Emokpae, Courage, MD  Ascorbic Acid  (VITAMIN C) 1000 MG tablet Take 1,000 mg by mouth 3 (three) times daily.    Yes [provider]  aspirin  EC 81 MG tablet Take 1 tablet (81 mg total) by mouth daily with breakfast. In the morning 06/08/23  Yes Emokpae, Courage, MD  bismuth subsalicylate (PEPTO BISMOL) 262 MG/15ML suspension Take 30 mLs by mouth every 6 (six) hours as needed for indigestion or diarrhea or loose stools.   Yes [provider]  calcium  carbonate (OSCAL) 1500 (600 Ca) MG TABS tablet Take 600 mg of elemental calcium  by mouth 3 (three) times daily with meals.   Yes [provider]  carvedilol  (COREG ) 25 MG tablet Take 1 tablet (25 mg total) by mouth 2  (two) times daily with a meal. 06/08/23 09/06/23 Yes Emokpae, Courage, MD  Chlorpheniramine-Acetaminophen  (CORICIDIN HBP COLD/FLU PO) Take 1 tablet by mouth daily as needed (cough/cold).   Yes [provider]  Cholecalciferol  (VITAMIN D -3) 125 MCG (5000 UT) TABS Take 5,000 Units by mouth daily.   Yes [provider]  clopidogrel  (PLAVIX ) 75 MG tablet Take 1 tablet (75 mg total) by mouth daily. 06/08/23  Yes Emokpae, Courage, MD  fish oil-omega-3 fatty acids  1000 MG capsule Take 1 g by mouth daily.    Yes [provider]  furosemide  (LASIX ) 40 MG tablet Take 1 tablet (40 mg total) by mouth daily. 06/08/23 06/07/24 Yes Emokpae, Courage, MD  hydroxypropyl methylcellulose (ISOPTO TEARS) 2.5 % ophthalmic solution Place 1 drop into both eyes 3 (three) times daily as needed (for dry eyes).   Yes [provider]  insulin  degludec (TRESIBA  FLEXTOUCH) 100 UNIT/ML FlexTouch Pen Inject 15-30 Units into the skin daily. Patient taking differently: Inject 20 Units into the skin at bedtime. 09/09/22  Yes Hawks, Christy A, FNP  insulin  lispro (HUMALOG  KWIKPEN) 200 UNIT/ML KwikPen Inject 50 Units into the skin 3 (three) times daily before meals. Patient taking differently: Inject 15-30 Units into the skin 3 (three) times daily before meals. 02/06/23  Yes Hawks, Bari A, FNP  montelukast  (SINGULAIR ) 10 MG tablet Take 1 tablet by mouth once daily with breakfast 05/05/23  Yes Hawks, Christy A, FNP  nitroGLYCERIN  (NITROSTAT ) 0.4  MG SL tablet Place 1 tablet (0.4 mg total) under the tongue every 5 (five) minutes as needed for chest pain. 04/10/23  Yes Dahal, Chapman, MD  oxybutynin  (DITROPAN -XL) 5 MG 24 hr tablet Take 1 tablet (5 mg total) by mouth at bedtime. 05/21/23  Yes Hawks, Bari A, FNP  pantoprazole  (PROTONIX ) 40 MG tablet Take 1 tablet by mouth once daily 06/11/23  Yes Hawks, Christy A, FNP  polyethylene glycol (MIRALAX  / GLYCOLAX ) 17 g packet Take 17 g by mouth daily.   Yes [provider]  rosuvastatin  (CRESTOR ) 40 MG tablet Take 1 tablet by mouth once daily 06/19/23  Yes Hawks, Christy A, FNP  Semaglutide , 2 MG/DOSE, (OZEMPIC , 2 MG/DOSE,) 8 MG/3ML SOPN Inject 2 mg into the skin once a week. 09/09/22  Yes Hawks, Bari A, FNP  BD ULTRA-FINE PEN NEEDLES 29G X 12.7MM MISC USE PENNEEDLES 3 TIMES  DAILY AS DIRECTED 06/17/18   Lavell Bari LABOR, FNP  blood glucose meter kit and supplies Dispense based on patient and insurance preference. Use up to four times daily as directed. (FOR ICD-10 E10.9, E11.9). 12/19/19   Lavell Bari LABOR, FNP  Calcium  Carbonate-Vit D-Min (CALTRATE 600+D PLUS MINERALS) 600-800 MG-UNIT TABS Take 1 tablet by mouth 3 (three) times daily.     [provider]  dapagliflozin  propanediol (FARXIGA ) 10 MG TABS tablet Take 1 tablet (10 mg total) by mouth daily before breakfast. Patient not taking: Reported on 08/15/2023 08/13/23   Lavell Bari A, FNP  glucose blood (GLUCOSE METER TEST) test strip Use 6-10 times a day, Uses ReliOn 12/19/19   Lavell Bari LABOR, FNP  Insulin  Syringe 27G X 1/2 0.5 ML MISC Use with Fiasp  insulin  TID 04/02/20   Lavell Bari LABOR, FNP  RELION PEN NEEDLES 29G X MISC USE 1 NEEDLE TO INJECT INSULIN  SUBCUTANEOUSLY 3 TIMES DAILY 01/31/16   Lavell Bari A, FNP    Allergies: Tape    Review of Systems  Respiratory:  Positive for shortness of breath.   Cardiovascular:  Positive for leg swelling.    Updated Vital Signs BP (!) 178/50   Pulse (!) 54   Temp 98.8 F (37.1 C)   Resp (!) 21   Ht 5' 2 (1.575 m)   Wt 88.9 kg   SpO2 97%   BMI 35.85 kg/m   Physical Exam Vitals and nursing note reviewed.  Constitutional:      General: She is not in acute distress.    Appearance: She is well-developed. She is not ill-appearing.  HENT:     Head: Normocephalic and atraumatic.   Eyes:     Extraocular Movements: Extraocular movements intact.     Conjunctiva/sclera: Conjunctivae normal.     Pupils: Pupils are equal, round,  and reactive to light.    Cardiovascular:     Rate and Rhythm: Normal rate and regular rhythm.     Pulses: Normal pulses.     Heart sounds: Normal heart sounds. No murmur heard. Pulmonary:     Effort: Pulmonary effort is normal. No tachypnea or respiratory distress.     Breath sounds: Normal breath sounds.  Abdominal:     Palpations: Abdomen is soft.     Tenderness: There is no abdominal tenderness.   Musculoskeletal:        General: No swelling. Normal range of motion.     Cervical back: Normal range of motion and neck supple.     Right lower leg: No tenderness. No edema.     Left  lower leg: No tenderness. No edema.   Skin:    General: Skin is warm and dry.     Capillary Refill: Capillary refill takes less than 2 seconds.   Neurological:     General: No focal deficit present.     Mental Status: She is alert.   Psychiatric:        Mood and Affect: Mood normal.     (all labs ordered are listed, but only abnormal results are displayed) Labs Reviewed  BASIC METABOLIC PANEL WITH GFR - Abnormal; Notable for the following components:      Result Value   Chloride 97 (*)    Glucose, Bld 237 (*)    All other components within normal limits  BRAIN NATRIURETIC PEPTIDE - Abnormal; Notable for the following components:   B Natriuretic Peptide 269.0 (*)    All other components within normal limits  TROPONIN I (HIGH SENSITIVITY) - Abnormal; Notable for the following components:   Troponin I (High Sensitivity) 19 (*)    All other components within normal limits  TROPONIN I (HIGH SENSITIVITY) - Abnormal; Notable for the following components:   Troponin I (High Sensitivity) 19 (*)    All other components within normal limits  CBC  HEPATIC FUNCTION PANEL    EKG: EKG Interpretation Date/Time:  Saturday August 15 2023 14:02:43 EDT Ventricular Rate:  60 PR Interval:  160 QRS Duration:  96 QT Interval:  462 QTC Calculation: 462 R Axis:   28  Text Interpretation: Normal sinus  rhythm ST & T wave abnormality, consider inferior ischemia Abnormal ECG When compared with ECG of 05-Jun-2023 17:28, inferior changes new Confirmed by Towana Sharper [phone]) on 08/15/2023 2:58:11 PM  Radiology: DG Chest 2 View Result Date: 08/15/2023 CLINICAL DATA:  Shortness of breath. EXAM: CHEST - 2 VIEW COMPARISON:  06/05/2023. FINDINGS: The heart is enlarged the mediastinal contour stable. There is atherosclerotic calcification of the aorta. The pulmonary vasculature is distended. Interstitial prominence is present bilaterally and mild airspace disease is present at the left lung base. There are suspected trace bilateral pleural effusions. No pneumothorax is seen. Sternotomy wires are present. Degenerative changes are present in the thoracic spine. IMPRESSION: 1. Cardiomegaly with pulmonary vascular congestion. 2. Interstitial prominence bilaterally with mild airspace disease at the left lung base, possible edema or infiltrate. 3. Suspected trace bilateral pleural effusions. Electronically Signed   By: Leita Birmingham M.D.   On: 08/15/2023 14:27     Procedures   Medications Ordered in the ED  carvedilol  (COREG ) tablet 25 mg (25 mg Oral Given 08/15/23 1619)  rosuvastatin  (CRESTOR ) tablet 40 mg (40 mg Oral Given 08/15/23 1617)  furosemide  (LASIX ) injection 40 mg (40 mg Intravenous Given 08/15/23 1617)                                    Medical Decision Making Amount and/or Complexity of Data Reviewed Labs: ordered. Radiology: ordered.   This patient presents to the ED for concern of shortness of breath, this involves an extensive number of treatment options, and is a complaint that carries with it a high risk of complications and morbidity.  The differential diagnosis includes CHF exacerbation, COPD exacerbation, STEMI, NSTEMI, arrhythmia, electrolyte abnormality,   Additional history obtained:  Additional history obtained from EMR External records from outside source obtained and  reviewed including family medicine notes   Lab Tests:  I Ordered, and personally interpreted labs.  The pertinent results include: CBC WNL, BMP with mildly reduced chloride at 97 and elevated glucose at 237, BNP 269, troponin 19, 19   Imaging Studies ordered:  I ordered imaging studies including chest x-ray I independently visualized and interpreted imaging which showed cardiomegaly with pulmonary vascular congestion.  Suspected trace bilateral pleural effusion I agree with the radiologist interpretation   Cardiac Monitoring: / EKG:  The patient was maintained on a cardiac monitor.  I personally viewed and interpreted the cardiac monitored which showed an underlying rhythm of: Normal sinus rhythm, ST and T wave abnormalities   Problem List / ED Course / Critical interventions / Medication management I ordered medication including carvedilol , rosuvastatin , Lasix  I have reviewed the patients home medicines and have made adjustments as needed   Consultations Obtained:  I requested consultation with the hospitalist, Dr. Barbra,  and discussed lab and imaging findings as well as pertinent plan - they recommend: Admission for CHF exacerbation   Test / Admission - Considered:  Admit     Final diagnoses:  Acute on chronic congestive heart failure, unspecified heart failure type [facility])  Acute hypoxic respiratory failure [facility])    ED Discharge Orders     None          Francis Ileana SAILOR, PA-C 08/15/23 1801    Suzette Pac, MD 08/17/23 [phone]

## 2023-08-15 NOTE — ED Notes (Signed)
 Sandwhich , applesauce, and drink given to pt.

## 2023-08-15 NOTE — ED Triage Notes (Signed)
 Pt reports:  Sob Concerned about low oxygen level Worsening over last 2-3 weeks Leg swelling Ongoing worsening Overall weight gain

## 2023-08-15 NOTE — H&P (Signed)
 History and Physical    Patient: Leah Ferrell FMW:[id number] DOB: 1950-04-15 DOA: 08/15/2023 DOS: the patient was seen and examined on 08/15/2023 PCP: Lavell Bari LABOR, FNP  Patient coming from: Home  Chief Complaint:  Chief Complaint  Patient presents with   Shortness of Breath   Leg Swelling   HPI: Leah Ferrell is a 73 y.o. female with medical history significant of diabetes, chronic diastolic heart failure with coronary artery disease, history of NSTEMI with stenting and three-vessel CABG, peripheral vascular disease, hypertension, hyperlipidemia.  Patient presents with shortness of breath over the past 2 to 3 weeks.  He has been having increasing orthopnea, dyspnea on exertion.  She came to the hospital for evaluation and was noted to be hypoxic to the upper 80s.  She was started on nasal cannula with good improvement.  No fevers, chills, nausea, vomiting.  No chest pain.  Review of Systems: As mentioned in the history of present illness. All other systems reviewed and are negative. Past Medical History:  Diagnosis Date   CAD (coronary artery disease)    a. 04/2012 NSTEMI: s/p DES to LAD.   Chronic diastolic CHF (congestive heart failure) (HCC)    Diabetes mellitus without complication (HCC)    Dysrhythmia    Environmental and seasonal allergies    Hyperlipidemia    Hypertension    Leukocytosis    Morbid obesity (HCC)    NSTEMI (non-ST elevated myocardial infarction) (HCC) 04/27/2012   PONV (postoperative nausea and vomiting)    PVD (peripheral vascular disease) (HCC)    a. 04/2012 ABI: R 0.49, L 0.39. Angiography 06/14: Significant ostial left common iliac artery stenosis extending into the distal aorta, severe left common femoral artery stenosis, bilateral SFA occlusion with heavy calcifications. Functionally, one-vessel runoff bilaterally below the knee   Renal artery stenosis (HCC)    s/p angioplasty/stenting bilateral renal arteries 03/16/19   Shortness of breath     Past Surgical History:  Procedure Laterality Date   ABDOMINAL AORTAGRAM N/A 07/21/2012   Procedure: ABDOMINAL EZELLA;  Surgeon: Deatrice LABOR Cage, MD;  Location: [facility] CATH LAB;  Service: Cardiovascular;  Laterality: N/A;   ABDOMINAL AORTOGRAM W/LOWER EXTREMITY N/A 05/09/2020   Procedure: ABDOMINAL AORTOGRAM W/LOWER EXTREMITY;  Surgeon: Cage Deatrice LABOR, MD;  Location: [facility] INVASIVE CV LAB;  Service: Cardiovascular;  Laterality: N/A;   CARDIAC CATHETERIZATION     stents X 2   CATARACT EXTRACTION W/PHACO Left 09/08/2022   Procedure: CATARACT EXTRACTION PHACO AND INTRAOCULAR LENS PLACEMENT (IOC) with placement of Corticosteroid;  Surgeon: Harrie Agent, MD;  Location: [facility] ORS;  Service: Ophthalmology;  Laterality: Left;  CDE 8.48   CORONARY ANGIOGRAM  04/27/2012   Procedure: CORONARY ANGIOGRAM;  Surgeon: Aleene JINNY Passe, MD;  Location: [facility] CATH LAB;  Service: Cardiovascular;;   CORONARY ARTERY BYPASS GRAFT N/A 06/25/2020   Procedure: CORONARY ARTERY BYPASS GRAFTING (CABG)X3. LEFT INTERNAL MAMMARY ARTERY. RIGHT ENDOSCOPIC SAPHENOUS VEIN HARVESTING.;  Surgeon: German Bartlett PEDLAR, MD;  Location: [facility] OR;  Service: Open Heart Surgery;  Laterality: N/A;   CORONARY STENT INTERVENTION N/A 04/08/2023   Procedure: CORONARY STENT INTERVENTION;  Surgeon: Cage Deatrice LABOR, MD;  Location: [facility];  Service: Cardiovascular;  Laterality: N/A;  RCA   ENDARTERECTOMY FEMORAL Left 11/02/2013   Procedure: RESECTION LEFT COMMON FEMORAL ARTERY AND INSERTION OF INTERPOSITION 7mm HEMASHIELD GRAFT FROM LEFT EXTERNAL  ILIAC ARTERY TO LEFT PROFUNDA  FEMORIS ARTERY;  Surgeon: Agent JONETTA Collum, MD;  Location: [facility] OR;  Service: Vascular;  Laterality:  Left;   EYE SURGERY Right 2017   FRACTURE SURGERY Right Jul 07, 2014   Right Foot-  [place]  at Home  by Heat duct   ILIAC ARTERY STENT Left 08/31/2013   LAD stent     LEFT HEART CATH AND CORONARY ANGIOGRAPHY N/A 05/09/2020   Procedure: LEFT HEART CATH AND CORONARY ANGIOGRAPHY;   Surgeon: Darron Deatrice LABOR, MD;  Location: [facility];  Service: Cardiovascular;  Laterality: N/A;   LEFT HEART CATH AND CORS/GRAFTS ANGIOGRAPHY N/A 04/08/2023   Procedure: LEFT HEART CATH AND CORS/GRAFTS ANGIOGRAPHY;  Surgeon: Darron Deatrice LABOR, MD;  Location: [facility];  Service: Cardiovascular;  Laterality: N/A;   LEFT HEART CATHETERIZATION WITH CORONARY ANGIOGRAM N/A 04/23/2012   Procedure: LEFT HEART CATHETERIZATION WITH CORONARY ANGIOGRAM;  Surgeon: Peter M Swaziland, MD;  Location: [facility] CATH LAB;  Service: Cardiovascular;  Laterality: N/A;   LOWER EXTREMITY ANGIOGRAM Left 08/31/2013   Procedure: LOWER EXTREMITY ANGIOGRAM;  Surgeon: Deatrice LABOR Darron, MD;  Location: [facility] CATH [facility];  Service: Cardiovascular;  Laterality: Left;   PERCUTANEOUS STENT INTERVENTION Left 08/31/2013   Procedure: PERCUTANEOUS STENT INTERVENTION;  Surgeon: Deatrice LABOR Darron, MD;  Location: [facility] CATH [facility];  Service: Cardiovascular;  Laterality: Left;  Common Iliac artery   PERIPHERAL VASCULAR CATHETERIZATION N/A 03/19/2016   Procedure: Abdominal Aortogram w/Lower Extremity;  Surgeon: Deatrice LABOR Darron, MD;  Location: [facility];  Service: Cardiovascular;  Laterality: N/A;   PERIPHERAL VASCULAR CATHETERIZATION  03/19/2016   Procedure: Peripheral Vascular Balloon Angioplasty-CFA Left;  Surgeon: Deatrice LABOR Darron, MD;  Location: [facility];  Service: Cardiovascular;;   PERIPHERAL VASCULAR INTERVENTION Bilateral 03/16/2019   Procedure: PERIPHERAL VASCULAR INTERVENTION;  Surgeon: Darron Deatrice LABOR, MD;  Location: [facility];  Service: Cardiovascular;  Laterality: Bilateral;  RENAL   PERIPHERAL VASCULAR INTERVENTION Bilateral 05/09/2020   Procedure: PERIPHERAL VASCULAR INTERVENTION;  Surgeon: Darron Deatrice LABOR, MD;  Location: [facility];  Service: Cardiovascular;  Laterality: Bilateral;  Iliac   RENAL ANGIOGRAPHY  03/16/2019   RENAL ANGIOGRAPHY Bilateral 03/16/2019   Procedure: RENAL ANGIOGRAPHY;   Surgeon: Darron Deatrice LABOR, MD;  Location: [facility];  Service: Cardiovascular;  Laterality: Bilateral;   RENAL ANGIOGRAPHY Bilateral 04/08/2023   Procedure: RENAL ANGIOGRAPHY;  Surgeon: Darron Deatrice LABOR, MD;  Location: [facility];  Service: Cardiovascular;  Laterality: Bilateral;   TEE WITHOUT CARDIOVERSION N/A 06/25/2020   Procedure: TRANSESOPHAGEAL ECHOCARDIOGRAM (TEE);  Surgeon: German Bartlett PEDLAR, MD;  Location: [facility] OR;  Service: Open Heart Surgery;  Laterality: N/A;   TUMOR REMOVAL     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 Her smoking use included cigarettes. She started smoking about 59 years ago. She has a 36.8 pack-year smoking history. She has never used smokeless tobacco. She reports that she does not drink alcohol  and does not use drugs.  Allergies  Allergen Reactions   Tape Rash    Family History  Problem Relation Age of Onset   Diabetes Mother    Hyperlipidemia Mother    Hypertension Mother    Peripheral vascular disease Mother    Other Sister        drowned   Other Sister        drowned   Diabetes Daughter        borderline   Hypertension Daughter    Diabetes Maternal Grandmother    Heart attack Maternal Grandfather  Heart disease Brother    Diabetes Brother    Alcohol  abuse Brother    Diabetes Son    Hypertension Son    Hyperlipidemia Son    Breast cancer Neg Hx     Prior to Admission medications   Medication Sig Start Date End Date Taking? Authorizing Provider  acetaminophen  (TYLENOL ) 325 MG tablet Take 2 tablets (650 mg total) by mouth every 6 (six) hours as needed for mild pain (pain score 1-3) (or Fever >/= 101). 06/08/23  Yes Emokpae, Courage, MD  albuterol  (VENTOLIN  HFA) 108 (90 Base) MCG/ACT inhaler Inhale 2 puffs into the lungs every 6 (six) hours as needed for shortness of breath or wheezing. 06/08/23  Yes Emokpae, Courage, MD  amLODipine  (NORVASC ) 10 MG tablet Take 1 tablet (10 mg  total) by mouth daily. 06/08/23 09/06/23 Yes Emokpae, Courage, MD  Ascorbic Acid  (VITAMIN C) 1000 MG tablet Take 1,000 mg by mouth 3 (three) times daily.    Yes [provider]  aspirin  EC 81 MG tablet Take 1 tablet (81 mg total) by mouth daily with breakfast. In the morning 06/08/23  Yes Emokpae, Courage, MD  bismuth subsalicylate (PEPTO BISMOL) 262 MG/15ML suspension Take 30 mLs by mouth every 6 (six) hours as needed for indigestion or diarrhea or loose stools.   Yes [provider]  calcium  carbonate (OSCAL) 1500 (600 Ca) MG TABS tablet Take 600 mg of elemental calcium  by mouth 3 (three) times daily with meals.   Yes [provider]  carvedilol  (COREG ) 25 MG tablet Take 1 tablet (25 mg total) by mouth 2 (two) times daily with a meal. 06/08/23 09/06/23 Yes Emokpae, Courage, MD  Chlorpheniramine-Acetaminophen  (CORICIDIN HBP COLD/FLU PO) Take 1 tablet by mouth daily as needed (cough/cold).   Yes [provider]  Cholecalciferol  (VITAMIN D -3) 125 MCG (5000 UT) TABS Take 5,000 Units by mouth daily.   Yes [provider]  clopidogrel  (PLAVIX ) 75 MG tablet Take 1 tablet (75 mg total) by mouth daily. 06/08/23  Yes Emokpae, Courage, MD  fish oil-omega-3 fatty acids  1000 MG capsule Take 1 g by mouth daily.    Yes [provider]  furosemide  (LASIX ) 40 MG tablet Take 1 tablet (40 mg total) by mouth daily. 06/08/23 06/07/24 Yes Emokpae, Courage, MD  hydroxypropyl methylcellulose (ISOPTO TEARS) 2.5 % ophthalmic solution Place 1 drop into both eyes 3 (three) times daily as needed (for dry eyes).   Yes [provider]  insulin  degludec (TRESIBA  FLEXTOUCH) 100 UNIT/ML FlexTouch Pen Inject 15-30 Units into the skin daily. Patient taking differently: Inject 20 Units into the skin at bedtime. 09/09/22  Yes Hawks, Christy A, FNP  insulin  lispro (HUMALOG  KWIKPEN) 200 UNIT/ML KwikPen Inject 50 Units into the skin 3 (three) times daily before meals. Patient taking  differently: Inject 15-30 Units into the skin 3 (three) times daily before meals. 02/06/23  Yes Hawks, Bari LABOR, FNP  montelukast  (SINGULAIR ) 10 MG tablet Take 1 tablet by mouth once daily with breakfast 05/05/23  Yes Hawks, Christy A, FNP  nitroGLYCERIN  (NITROSTAT ) 0.4 MG SL tablet Place 1 tablet (0.4 mg total) under the tongue every 5 (five) minutes as needed for chest pain. 04/10/23  Yes Dahal, Chapman, MD  oxybutynin  (DITROPAN -XL) 5 MG 24 hr tablet Take 1 tablet (5 mg total) by mouth at bedtime. 05/21/23  Yes Hawks, Bari A, FNP  pantoprazole  (PROTONIX ) 40 MG tablet Take 1 tablet by mouth once daily 06/11/23  Yes Hawks, Christy A, FNP  polyethylene glycol (MIRALAX  /  GLYCOLAX ) 17 g packet Take 17 g by mouth daily.   Yes [provider]  rosuvastatin  (CRESTOR ) 40 MG tablet Take 1 tablet by mouth once daily 06/19/23  Yes Hawks, Christy A, FNP  Semaglutide , 2 MG/DOSE, (OZEMPIC , 2 MG/DOSE,) 8 MG/3ML SOPN Inject 2 mg into the skin once a week. 09/09/22  Yes Hawks, Bari A, FNP  BD ULTRA-FINE PEN NEEDLES 29G X 12.7MM MISC USE PENNEEDLES 3 TIMES  DAILY AS DIRECTED 06/17/18   Lavell Bari LABOR, FNP  blood glucose meter kit and supplies Dispense based on patient and insurance preference. Use up to four times daily as directed. (FOR ICD-10 E10.9, E11.9). 12/19/19   Lavell Bari LABOR, FNP  Calcium  Carbonate-Vit D-Min (CALTRATE 600+D PLUS MINERALS) 600-800 MG-UNIT TABS Take 1 tablet by mouth 3 (three) times daily.     [provider]  dapagliflozin  propanediol (FARXIGA ) 10 MG TABS tablet Take 1 tablet (10 mg total) by mouth daily before breakfast. Patient not taking: Reported on 08/15/2023 08/13/23   Lavell Bari A, FNP  glucose blood (GLUCOSE METER TEST) test strip Use 6-10 times a day, Uses ReliOn 12/19/19   Lavell Bari LABOR, FNP  Insulin  Syringe 27G X 1/2 0.5 ML MISC Use with Fiasp  insulin  TID 04/02/20   Lavell Bari LABOR, FNP  RELION PEN NEEDLES 29G X MISC USE 1 NEEDLE TO INJECT INSULIN   SUBCUTANEOUSLY 3 TIMES DAILY 01/31/16   Lavell Bari LABOR, FNP    Physical Exam: Vitals:   08/15/23 1530 08/15/23 1600 08/15/23 1730 08/15/23 1833  BP: (!) 165/75 (!) 178/50 (!) 177/47 (!) 164/53  Pulse: (!) 56 (!) 54 (!) 57 (!) 55  Resp: 19 (!) 21  18  Temp:    98 F (36.7 C)  TempSrc:    Oral  SpO2: 96% 97% 95% 98%  Weight:      Height:       General: Elderly female. Awake and alert and oriented x3. No acute cardiopulmonary distress.  HEENT: Normocephalic atraumatic.  Right and left ears normal in appearance.  Pupils equal, round, reactive to light. Extraocular muscles are intact. Sclerae anicteric and noninjected.  Moist mucosal membranes. No mucosal lesions.  Neck: Neck supple without lymphadenopathy. No carotid bruits. No masses palpated.  Cardiovascular: Regular rate with normal S1-S2 sounds. No murmurs, rubs, gallops auscultated. No JVD.  Respiratory: Good respiratory effort with no wheezes, rales, rhonchi. Lungs clear to auscultation bilaterally.  No accessory muscle use. Abdomen: Soft, nontender, nondistended. Active bowel sounds. No masses or hepatosplenomegaly  Skin: No rashes, lesions, or ulcerations.  Dry, warm to touch. 2+ dorsalis pedis and radial pulses. Musculoskeletal: No calf or leg pain. All major joints not erythematous nontender.  No upper or lower joint deformation.  Good ROM.  No contractures  Psychiatric: Intact judgment and insight. Pleasant and cooperative. Neurologic: No focal neurological deficits. Strength is 5/5 and symmetric in upper and lower extremities.  Cranial nerves II through XII are grossly intact.  Data Reviewed: Imaging and lab studies reviewed  Assessment and Plan: No notes have been filed under this hospital service. Service: Hospitalist  Principal Problem:   Acute respiratory failure with hypoxia (HCC) Active Problems:   Essential hypertension   Diabetes mellitus with peripheral vascular disease (HCC)   CAD (coronary artery disease)    S/P CABG x 3   GERD (gastroesophageal reflux disease)  Acute respiratory failure with hypoxia Acute on chronic heart failure Telemetry monitoring Strict I/O Daily Weights Diuresis: lasix  Potassium: 40 mEq twice a day by  mouth Echo cardiac exam tomorrow Repeat BMP tomorrow  Coronary artery disease, status post CABG x 3 Continue statin Diabetes Home insulin  SSI and CBG AC & QHS Hypertension Continue antihypertensives GERD    Advance Care Planning:   Code Status: Full Code confirmed by patient  Consults: None  Family Communication: Patient's daughter present during interview and exam  Severity of Illness: The appropriate patient status for this patient is INPATIENT. Inpatient status is judged to be reasonable and necessary in order to provide the required intensity of service to ensure the patient's safety. The patient's presenting symptoms, physical exam findings, and initial radiographic and laboratory data in the context of their chronic comorbidities is felt to place them at high risk for further clinical deterioration. Furthermore, it is not anticipated that the patient will be medically stable for discharge from the hospital within 2 midnights of admission.   * I certify that at the point of admission it is my clinical judgment that the patient will require inpatient hospital care spanning beyond 2 midnights from the point of admission due to high intensity of service, high risk for further deterioration and high frequency of surveillance required.*  Author: Letanya Froh J Toniette Devera, DO 08/15/2023 6:42 PM  For on call review [email or web address].

## 2023-08-16 ENCOUNTER — Inpatient Hospital Stay (HOSPITAL_COMMUNITY)

## 2023-08-16 DIAGNOSIS — J9601 Acute respiratory failure with hypoxia: Secondary | ICD-10-CM | POA: Diagnosis not present

## 2023-08-16 DIAGNOSIS — I5033 Acute on chronic diastolic (congestive) heart failure: Secondary | ICD-10-CM

## 2023-08-16 LAB — BASIC METABOLIC PANEL WITH GFR
Anion gap: 10 (ref 5–15)
BUN: 13 mg/dL (ref 8–23)
CO2: 31 mmol/L (ref 22–32)
Calcium: 8.6 mg/dL — ABNORMAL LOW (ref 8.9–10.3)
Chloride: 93 mmol/L — ABNORMAL LOW (ref 98–111)
Creatinine, Ser: 0.7 mg/dL (ref 0.44–1.00)
GFR, Estimated: >60 mL/min (ref >60)
Glucose, Bld: 191 mg/dL — ABNORMAL HIGH (ref 70–99)
Potassium: 3.1 mmol/L — ABNORMAL LOW (ref 3.5–5.1)
Sodium: 134 mmol/L — ABNORMAL LOW (ref 135–145)

## 2023-08-16 LAB — GLUCOSE, CAPILLARY
Glucose-Capillary: 155 mg/dL — ABNORMAL HIGH (ref 70–99)
Glucose-Capillary: 266 mg/dL — ABNORMAL HIGH (ref 70–99)
Glucose-Capillary: 224 mg/dL — ABNORMAL HIGH (ref 70–99)
Glucose-Capillary: 256 mg/dL — ABNORMAL HIGH (ref 70–99)

## 2023-08-16 LAB — ECHOCARDIOGRAM LIMITED
AV Mean grad: 6 mmHg
AV Peak grad: 12.5 mmHg
Ao pk vel: 1.77 m/s
Area-P 1/2: 3.37 cm2
Calc EF: 69 %
Height: 62 in
Single Plane A2C EF: 72.8 %
Single Plane A4C EF: 63.7 %
Weight: 3104.08 [oz_av]

## 2023-08-16 MED ORDER — SPIRONOLACTONE 25 MG PO TABS
25.0000 mg | ORAL_TABLET | Freq: Every day | ORAL | Status: DC
  Administered 2023-08-16 – 2023-08-19 (×4): 25 mg via ORAL
  Filled 2023-08-16 (×4): qty 1

## 2023-08-16 MED ORDER — ORAL CARE MOUTH RINSE: 15.0000 mL | OROMUCOSAL | Status: DC | PRN

## 2023-08-16 MED ORDER — HYDRALAZINE HCL 20 MG/ML IJ SOLN
10.0000 mg | Freq: Four times a day (QID) | INTRAMUSCULAR | Status: DC | PRN
  Administered 2023-08-16: 10 mg via INTRAVENOUS
  Filled 2023-08-16: qty 1

## 2023-08-16 NOTE — Plan of Care (Signed)
  Problem: Nutritional: Goal: Maintenance of adequate nutrition will improve Outcome: Progressing Goal: Progress toward achieving an optimal weight will improve Outcome: Progressing   Problem: Skin Integrity: Goal: Risk for impaired skin integrity will decrease Outcome: Progressing

## 2023-08-16 NOTE — Progress Notes (Signed)
 PROGRESS NOTE    Leah Ferrell  FMW:[id number] DOB: 1950/10/07 DOA: 08/15/2023 PCP: Lavell Bari LABOR, FNP   Brief Narrative:    Leah Ferrell is a 73 y.o. female with medical history significant of diabetes, chronic diastolic heart failure with coronary artery disease, history of NSTEMI with stenting and three-vessel CABG, peripheral vascular disease, hypertension, hyperlipidemia.  Patient presents with shortness of breath over the past 2 to 3 weeks.  She has been admitted for acute hypoxemic respiratory failure secondary to acute on chronic HFpEF.  Assessment & Plan:   Principal Problem:   Acute respiratory failure with hypoxia (HCC) Active Problems:   Essential hypertension   Diabetes mellitus with peripheral vascular disease (HCC)   CAD (coronary artery disease)   S/P CABG x 3   GERD (gastroesophageal reflux disease)  Assessment and Plan:  Acute hypoxemic respiratory failure secondary to acute on chronic HFpEF Prior 2D echocardiogram 05/2023 with LVEF 60-65% Strict I/O Daily Weights Lasix  60 mg IV twice daily Potassium: 40 mEq twice a day by mouth Echo ordered and pending Repeat BMP tomorrow Follows with Dr. Darron outpatient Resume spironolactone  and monitor closely, hold lisinopril  Coronary artery disease, status post CABG x 3 Continue statin Diabetes Home insulin  SSI and CBG AC & QHS Hypertension Continue antihypertensives GERD Class II obesity BMI 35.48 7.  Hypokalemia  - Replete and reevaluate in a.m.    DVT prophylaxis:Lovenox  Code Status: Full Family Communication: None at bedside Disposition Plan:  Status is: Inpatient Remains inpatient appropriate because: Need for IV medications   Consultants:  None  Procedures:  None  Antimicrobials:  None   Subjective: Patient seen and evaluated today with no new acute complaints or concerns. No acute concerns or events noted overnight.  Objective: Vitals:   08/15/23 1850 08/15/23 2009  08/16/23 0526 08/16/23 0800  BP: (!) 144/59 (!) 168/50 (!) 155/50   Pulse: (!) 55 (!) 51 (!) 59   Resp: 17 18 17    Temp: 98.1 F (36.7 C) 98.2 F (36.8 C) 98.1 F (36.7 C)   TempSrc: Oral Oral Oral   SpO2: 99% 98% 98% 93%  Weight:   88 kg   Height:        Intake/Output Summary (Last 24 hours) at 08/16/2023 1254 Last data filed at 08/16/2023 0500 Gross per 24 hour  Intake 840 ml  Output 300 ml  Net 540 ml   Filed Weights   08/15/23 1356 08/16/23 0526  Weight: 88.9 kg 88 kg    Examination:  General exam: Appears calm and comfortable  Respiratory system: Clear to auscultation. Respiratory effort normal. Cardiovascular system: S1 & S2 heard, RRR.  Gastrointestinal system: Abdomen is soft Central nervous system: Alert and awake Extremities: No edema Skin: No significant lesions noted Psychiatry: Flat affect.    Data Reviewed: I have personally reviewed following labs and imaging studies  CBC: Recent Labs  Lab 08/11/23 1207 08/15/23 1422  WBC 8.4 9.2  NEUTROABS 5.1  --   HGB 13.0 12.3  HCT 41.3 39.1  MCV 100* 96.5  PLT 204 196   Basic Metabolic Panel: Recent Labs  Lab 08/11/23 1207 08/15/23 1422 08/16/23 0334  NA 139 139 134*  K 4.1 3.6 3.1*  CL 96 97* 93*  CO2 25 31 31   GLUCOSE 185* 237* 191*  BUN 11 12 13   CREATININE 0.75 0.71 0.70  CALCIUM  9.4 9.2 8.6*   GFR: Estimated Creatinine Clearance: 65.5 mL/min (by C-G formula based on SCr of 0.7 mg/dL). Liver Function  Tests: Recent Labs  Lab 08/11/23 1207 08/15/23 1435  AST 18 18  ALT 15 19  ALKPHOS 70 63  BILITOT 0.4 0.7  PROT 7.1 7.7  ALBUMIN  4.1 3.5   No results for input(s): LIPASE, AMYLASE in the last 168 hours. No results for input(s): AMMONIA in the last 168 hours. Coagulation Profile: No results for input(s): INR, PROTIME in the last 168 hours. Cardiac Enzymes: No results for input(s): CKTOTAL, CKMB, CKMBINDEX, TROPONINI in the last 168 hours. BNP (last 3  results) No results for input(s): PROBNP in the last 8760 hours. HbA1C: No results for input(s): HGBA1C in the last 72 hours. CBG: Recent Labs  Lab 08/12/23 1420 08/15/23 2010 08/16/23 0730 08/16/23 1126  GLUCAP 185* 266* 155* 266*   Lipid Profile: No results for input(s): CHOL, HDL, LDLCALC, TRIG, CHOLHDL, LDLDIRECT in the last 72 hours. Thyroid  Function Tests: No results for input(s): TSH, T4TOTAL, FREET4, T3FREE, THYROIDAB in the last 72 hours. Anemia Panel: No results for input(s): VITAMINB12, FOLATE, FERRITIN, TIBC, IRON, RETICCTPCT in the last 72 hours. Sepsis Labs: No results for input(s): PROCALCITON, LATICACIDVEN in the last 168 hours.  No results found for this or any previous visit (from the past 240 hours).       Radiology Studies: DG Chest 2 View Result Date: 08/15/2023 CLINICAL DATA:  Shortness of breath. EXAM: CHEST - 2 VIEW COMPARISON:  06/05/2023. FINDINGS: The heart is enlarged the mediastinal contour stable. There is atherosclerotic calcification of the aorta. The pulmonary vasculature is distended. Interstitial prominence is present bilaterally and mild airspace disease is present at the left lung base. There are suspected trace bilateral pleural effusions. No pneumothorax is seen. Sternotomy wires are present. Degenerative changes are present in the thoracic spine. IMPRESSION: 1. Cardiomegaly with pulmonary vascular congestion. 2. Interstitial prominence bilaterally with mild airspace disease at the left lung base, possible edema or infiltrate. 3. Suspected trace bilateral pleural effusions. Electronically Signed   By: Leita Birmingham M.D.   On: 08/15/2023 14:27        Scheduled Meds:  amLODipine   10 mg Oral Daily   aspirin  EC  81 mg Oral Q breakfast   carvedilol   25 mg Oral BID WC   clopidogrel   75 mg Oral Daily   enoxaparin  (LOVENOX ) injection  40 mg Subcutaneous Q24H   furosemide   60 mg Intravenous BID    insulin  aspart  0-15 Units Subcutaneous TID WC   insulin  aspart  0-5 Units Subcutaneous QHS   insulin  glargine-yfgn  20 Units Subcutaneous QHS   montelukast   10 mg Oral Q breakfast   oxybutynin   5 mg Oral QHS   pantoprazole   40 mg Oral Daily   potassium chloride   40 mEq Oral BID   rosuvastatin   40 mg Oral Daily   spironolactone   25 mg Oral Daily     LOS: 1 day    Time spent: 55 minutes    Balian Schaller Leah Fairly, DO [facility]  If 7PM-7AM, please contact night-coverage [email or web address] 08/16/2023, 12:54 PM

## 2023-08-16 NOTE — Progress Notes (Signed)
*  PRELIMINARY RESULTS* Echocardiogram 2D Echocardiogram has been performed.  Leah Ferrell 08/16/2023, 11:53 AM

## 2023-08-16 NOTE — TOC Initial Note (Signed)
 Transition of Care [facility]) - Initial/Assessment Note    Patient Details  Name: Leah Ferrell MRN: [id number] Date of Birth: 07/26/1950  Transition of Care [facility]) CM/SW Contact:    Nena LITTIE Coffee, RN Phone Number: 08/16/2023, 6:18 PM  Expected Discharge Plan: Home/Self Care Barriers to Discharge: Continued Medical Work up  Patient Goals and CMS Choice Patient states their goals for this hospitalization and ongoing recovery are:: Return home   Expected Discharge Plan and Services In-house Referral: Clinical Social Work Discharge Planning Services: CM Consult   Living arrangements for the past 2 months: Single Family Home       Prior Living Arrangements/Services Living arrangements for the past 2 months: Single Family Home Lives with:: Self Patient language and need for interpreter reviewed:: Yes Do you feel safe going back to the place where you live?: Yes      Need for Family Participation in Patient Care: Yes (Comment) Care giver support system in place?: Yes (comment) Current home services: DME (cane, rollator) Criminal Activity/Legal Involvement Pertinent to Current Situation/Hospitalization: No - Comment as needed  Activities of Daily Living   ADL Screening (condition at time of admission) Independently performs ADLs?: Yes (appropriate for developmental age) Is the patient deaf or have difficulty hearing?: No Does the patient have difficulty seeing, even when wearing glasses/contacts?: No Does the patient have difficulty concentrating, remembering, or making decisions?: No  Permission Sought/Granted   Emotional Assessment   Orientation: : Oriented to Self, Oriented to Place, Oriented to  Time, Oriented to Situation Alcohol  / Substance Use: Not Applicable Psych Involvement: No (comment)  Admission diagnosis:  Acute respiratory failure with hypoxia (HCC) [J96.01] Acute on chronic congestive heart failure, unspecified heart failure type (HCC) [I50.9] Acute hypoxic  respiratory failure (HCC) [J96.01] Patient Active Problem List   Diagnosis Date Noted   Acute respiratory failure with hypoxia (HCC) 06/05/2023   GERD (gastroesophageal reflux disease) 06/05/2023   Elevated troponin 04/06/2023   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   Critical lower limb ischemia (HCC) 05/09/2020   Renal artery stenosis (HCC)    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   Hypertensive urgency 12/13/2018   Osteopenia 04/27/2018   Acute on chronic diastolic CHF (congestive heart failure) (HCC) 12/02/2017   Dizziness 12/01/2017   Morbid obesity (HCC) 06/10/2017   PMB (postmenopausal bleeding) 11/12/2015   Hypokalemia 11/23/2014   Leg pain, left 07/06/2014   Vaginal atrophy 05/08/2014   Left leg swelling 01/17/2014   PAD (peripheral artery disease) (HCC) 10/18/2013   Abnormal stress test 07/18/2013   PVD (peripheral vascular disease) (HCC) 07/12/2013   Status post coronary artery stent placement 07/12/2013   Chronic diastolic heart failure (HCC) 08/27/2012   Carotid bruit 08/27/2012   Angina pectoris (HCC) 04/30/2012   CAD (coronary artery disease) 04/28/2012   Hx of non-ST elevation myocardial infarction (NSTEMI) 04/23/2012   Essential hypertension    Diabetes mellitus with peripheral vascular disease (HCC)    Mixed hyperlipidemia    PCP:  Lavell Bari LABOR, FNP Pharmacy:   [facility] [address] [place] - [address] [place] [address] Phone: [phone] Fax: [phone]  [facility]) - [place], [place] - [address] [facility] [address] [place] [place] [address] Phone: [phone] Fax: [phone]  [facility] [address] [place] [address] Phone: [phone] Fax: [phone]  Social Drivers of Health (SDOH) Social History: SDOH Screenings   Food Insecurity: No Food  Insecurity (08/15/2023)  Housing: Low Risk  (08/15/2023)  Transportation Needs: No  Transportation Needs (08/15/2023)  Utilities: Not At Risk (08/15/2023)  Alcohol  Screen: Low Risk  (12/25/2022)  Depression (PHQ2-9): Low Risk  (08/11/2023)  Financial Resource Strain: Low Risk  (12/25/2022)  Physical Activity: Insufficiently Active (12/25/2022)  Social Connections: Moderately Isolated (08/15/2023)  Stress: No Stress Concern Present (12/25/2022)  Tobacco Use: Medium Risk (08/15/2023)  Health Literacy: Adequate Health Literacy (12/25/2022)   SDOH Interventions: Readmission Risk Interventions    08/16/2023    6:14 PM  Readmission Risk Prevention Plan  Transportation Screening Complete  PCP or Specialist Appt within 5-7 Days Complete  Home Care Screening Complete  Medication Review (RN CM) Complete

## 2023-08-16 NOTE — Plan of Care (Signed)

## 2023-08-17 ENCOUNTER — Encounter (HOSPITAL_COMMUNITY): Payer: Self-pay | Admitting: Family Medicine

## 2023-08-17 DIAGNOSIS — J9601 Acute respiratory failure with hypoxia: Secondary | ICD-10-CM | POA: Diagnosis not present

## 2023-08-17 LAB — BASIC METABOLIC PANEL WITH GFR
Anion gap: 12 (ref 5–15)
BUN: 14 mg/dL (ref 8–23)
CO2: 28 mmol/L (ref 22–32)
Calcium: 9.1 mg/dL (ref 8.9–10.3)
Chloride: 95 mmol/L — ABNORMAL LOW (ref 98–111)
Creatinine, Ser: 0.77 mg/dL (ref 0.44–1.00)
GFR, Estimated: >60 mL/min (ref >60)
Glucose, Bld: 249 mg/dL — ABNORMAL HIGH (ref 70–99)
Potassium: 3.9 mmol/L (ref 3.5–5.1)
Sodium: 135 mmol/L (ref 135–145)

## 2023-08-17 LAB — GLUCOSE, CAPILLARY
Glucose-Capillary: 238 mg/dL — ABNORMAL HIGH (ref 70–99)
Glucose-Capillary: 176 mg/dL — ABNORMAL HIGH (ref 70–99)
Glucose-Capillary: 225 mg/dL — ABNORMAL HIGH (ref 70–99)
Glucose-Capillary: 254 mg/dL — ABNORMAL HIGH (ref 70–99)

## 2023-08-17 MED ORDER — INSULIN ASPART 100 UNIT/ML IJ SOLN
4.0000 [IU] | Freq: Three times a day (TID) | INTRAMUSCULAR | Status: DC
  Administered 2023-08-17 – 2023-08-19 (×6): 4 [IU] via SUBCUTANEOUS

## 2023-08-17 MED ORDER — BISMUTH SUBSALICYLATE 262 MG/15ML PO SUSP
30.0000 mL | ORAL | Status: DC | PRN
  Administered 2023-08-17: 30 mL via ORAL
  Filled 2023-08-17: qty 118

## 2023-08-17 MED ORDER — INSULIN GLARGINE-YFGN 100 UNIT/ML ~~LOC~~ SOLN
23.0000 [IU] | Freq: Every day | SUBCUTANEOUS | Status: DC
  Administered 2023-08-17 – 2023-08-18 (×2): 23 [IU] via SUBCUTANEOUS
  Filled 2023-08-17 (×3): qty 0.23

## 2023-08-17 NOTE — Progress Notes (Signed)
   08/17/23 0800  ReDS Vest / Clip  Station Marker B  Ruler Value 34  ReDS Value Range (!) > 40  ReDS Actual Value 44  Anatomical Comments pt upright positipon

## 2023-08-17 NOTE — Progress Notes (Signed)
 PROGRESS NOTE    Leah Ferrell  FMW:[id number] DOB: 1950-10-26 DOA: 08/15/2023 PCP: Lavell Bari LABOR, FNP   Brief Narrative:    Leah Ferrell is a 73 y.o. female with medical history significant of diabetes, chronic diastolic heart failure with coronary artery disease, history of NSTEMI with stenting and three-vessel CABG, peripheral vascular disease, hypertension, hyperlipidemia.  Patient presents with shortness of breath over the past 2 to 3 weeks.  She has been admitted for acute hypoxemic respiratory failure secondary to acute on chronic HFpEF.  She continues to be diuresing well and requires another day or two to continue until euvolemic.  Assessment & Plan:   Principal Problem:   Acute respiratory failure with hypoxia (HCC) Active Problems:   Essential hypertension   Diabetes mellitus with peripheral vascular disease (HCC)   CAD (coronary artery disease)   S/P CABG x 3   GERD (gastroesophageal reflux disease)  Assessment and Plan:  Acute hypoxemic respiratory failure secondary to acute on chronic HFpEF Prior 2D echocardiogram 05/2023 with LVEF 60-65% Strict I/O Daily Weights Lasix  60 mg IV twice daily Potassium: 40 mEq twice a day by mouth Echo with LVEF 60-65% grade 2 diastolic dysfunction noted on 3/70 Repeat BMP tomorrow Follows with Dr. Darron outpatient Continue spironolactone  and hold lisinopril  Reds clip is 44% on 6/30 Coronary artery disease, status post CABG x 3 Continue statin Diabetes with hyperglycemia Home insulin  SSI and CBG AC & at bedtime Semglee  dose increased and NovoLog  added Hypertension Continue antihypertensives GERD Class II obesity BMI 35.48    DVT prophylaxis:Lovenox  Code Status: Full Family Communication: None at bedside Disposition Plan:  Status is: Inpatient Remains inpatient appropriate because: Need for IV medications   Consultants:  None  Procedures:  None  Antimicrobials:  None   Subjective: Patient seen and  evaluated today with no new acute complaints or concerns. No acute concerns or events noted overnight.  She is starting to feel better and continues to diurese well.  Still noted to be hypervolemic.  Objective: Vitals:   08/16/23 1945 08/16/23 2220 08/17/23 0206 08/17/23 0421  BP: (!) 182/52 (!) 166/44 (!) 186/56 (!) 167/57  Pulse: (!) 58 65 70 63  Resp: 16  16 14   Temp: 98.6 F (37 C)  98.5 F (36.9 C) 98.3 F (36.8 C)  TempSrc: Oral  Oral Oral  SpO2: 98% 96% 93% 98%  Weight:    88.1 kg  Height:        Intake/Output Summary (Last 24 hours) at 08/17/2023 1123 Last data filed at 08/17/2023 0900 Gross per 24 hour  Intake 240 ml  Output 3950 ml  Net -3710 ml   Filed Weights   08/15/23 1356 08/16/23 0526 08/17/23 0421  Weight: 88.9 kg 88 kg 88.1 kg    Examination:  General exam: Appears calm and comfortable  Respiratory system: Clear to auscultation. Respiratory effort normal. Cardiovascular system: S1 & S2 heard, RRR.  Gastrointestinal system: Abdomen is soft Central nervous system: Alert and awake Extremities: No edema Skin: No significant lesions noted Psychiatry: Flat affect.    Data Reviewed: I have personally reviewed following labs and imaging studies  CBC: Recent Labs  Lab 08/11/23 1207 08/15/23 1422  WBC 8.4 9.2  NEUTROABS 5.1  --   HGB 13.0 12.3  HCT 41.3 39.1  MCV 100* 96.5  PLT 204 196   Basic Metabolic Panel: Recent Labs  Lab 08/11/23 1207 08/15/23 1422 08/16/23 0334 08/17/23 0433  NA 139 139 134* 135  K 4.1  3.6 3.1* 3.9  CL 96 97* 93* 95*  CO2 25 31 31 28   GLUCOSE 185* 237* 191* 249*  BUN 11 12 13 14   CREATININE 0.75 0.71 0.70 0.77  CALCIUM  9.4 9.2 8.6* 9.1   GFR: Estimated Creatinine Clearance: 65.5 mL/min (by C-G formula based on SCr of 0.77 mg/dL). Liver Function Tests: Recent Labs  Lab 08/11/23 1207 08/15/23 1435  AST 18 18  ALT 15 19  ALKPHOS 70 63  BILITOT 0.4 0.7  PROT 7.1 7.7  ALBUMIN  4.1 3.5   No results for  input(s): LIPASE, AMYLASE in the last 168 hours. No results for input(s): AMMONIA in the last 168 hours. Coagulation Profile: No results for input(s): INR, PROTIME in the last 168 hours. Cardiac Enzymes: No results for input(s): CKTOTAL, CKMB, CKMBINDEX, TROPONINI in the last 168 hours. BNP (last 3 results) No results for input(s): PROBNP in the last 8760 hours. HbA1C: No results for input(s): HGBA1C in the last 72 hours. CBG: Recent Labs  Lab 08/16/23 0730 08/16/23 1126 08/16/23 1618 08/16/23 1941 08/17/23 0727  GLUCAP 155* 266* 224* 256* 238*   Lipid Profile: No results for input(s): CHOL, HDL, LDLCALC, TRIG, CHOLHDL, LDLDIRECT in the last 72 hours. Thyroid  Function Tests: No results for input(s): TSH, T4TOTAL, FREET4, T3FREE, THYROIDAB in the last 72 hours. Anemia Panel: No results for input(s): VITAMINB12, FOLATE, FERRITIN, TIBC, IRON, RETICCTPCT in the last 72 hours. Sepsis Labs: No results for input(s): PROCALCITON, LATICACIDVEN in the last 168 hours.  No results found for this or any previous visit (from the past 240 hours).       Radiology Studies: ECHOCARDIOGRAM LIMITED Result Date: 08/16/2023    ECHOCARDIOGRAM LIMITED REPORT   Patient Name:   Leah Ferrell Date of Exam: 08/16/2023 Medical Rec #:  [id number]         Height:       62.0 in Accession #:    [id number]        Weight:       194.0 lb Date of Birth:  02-01-1951          BSA:          1.887 m Patient Age:    73 years          BP:           155/50 mmHg Patient Gender: F                 HR:           53 bpm. Exam Location:  [facility] Procedure: 2D Echo, Cardiac Doppler and Limited Color Doppler (Both Spectral and            Color Flow Doppler were utilized during procedure). Indications:    I50.9* Heart failure (unspecified)  History:        Patient has prior history of Echocardiogram examinations, most                 recent 06/06/2023.  Sonographer:     Eva Lash Referring Phys: [phone] JACOB J STINSON IMPRESSIONS  1. Left ventricular ejection fraction, by estimation, is 60 to 65%. The left ventricle has normal function. The left ventricle has no regional wall motion abnormalities. There is mild concentric left ventricular hypertrophy. Left ventricular diastolic parameters are consistent with Grade II diastolic dysfunction (pseudonormalization). Elevated left atrial pressure.  2. Right ventricular systolic function is normal. The right ventricular size is normal.  3. Left atrial size was mildly dilated.  4. The mitral valve is degenerative. Trivial mitral valve regurgitation. No evidence of mitral stenosis. Severe mitral annular calcification.  5. The inferior vena cava is dilated in size with <50% respiratory variability, suggesting right atrial pressure of 15 mmHg. Comparison(s): No significant change from prior study. Prior images reviewed side by side. FINDINGS  Left Ventricle: Left ventricular ejection fraction, by estimation, is 60 to 65%. The left ventricle has normal function. The left ventricle has no regional wall motion abnormalities. The left ventricular internal cavity size was normal in size. There is  mild concentric left ventricular hypertrophy. Left ventricular diastolic parameters are consistent with Grade II diastolic dysfunction (pseudonormalization). Elevated left atrial pressure. Right Ventricle: The right ventricular size is normal. No increase in right ventricular wall thickness. Right ventricular systolic function is normal. Left Atrium: Left atrial size was mildly dilated. Right Atrium: Right atrial size was normal in size. Mitral Valve: The mitral valve is degenerative in appearance. Severe mitral annular calcification. Trivial mitral valve regurgitation. No evidence of mitral valve stenosis. Tricuspid Valve: Tricuspid valve regurgitation is mild. Aortic Valve: Aortic valve mean gradient measures 6.0 mmHg. Aortic valve peak gradient  measures 12.5 mmHg. Venous: The inferior vena cava is dilated in size with less than 50% respiratory variability, suggesting right atrial pressure of 15 mmHg. IAS/Shunts: No atrial level shunt detected by color flow Doppler.  LV Volumes (MOD) LV vol d, MOD A2C: 120.0 ml Diastology LV vol d, MOD A4C: 114.0 ml LV e' medial:    3.81 cm/s LV vol s, MOD A2C: 32.6 ml  LV E/e' medial:  40.4 LV vol s, MOD A4C: 41.4 ml  LV e' lateral:   6.96 cm/s LV SV MOD A2C:     87.4 ml  LV E/e' lateral: 22.1 LV SV MOD A4C:     114.0 ml LV SV MOD BP:      81.4 ml RIGHT VENTRICLE RV S prime:     8.27 cm/s TAPSE (M-mode): 1.2 cm AORTIC VALVE AV Vmax:           177.00 cm/s AV Vmean:          113.000 cm/s AV VTI:            0.496 m AV Peak Grad:      12.5 mmHg AV Mean Grad:      6.0 mmHg LVOT Vmax:         109.00 cm/s LVOT Vmean:        68.500 cm/s LVOT VTI:          0.318 m LVOT/AV VTI ratio: 0.64 MITRAL VALVE                TRICUSPID VALVE MV Area (PHT): 3.37 cm     TR Peak grad:   28.1 mmHg MV Decel Time: 225 msec     TR Vmax:        265.00 cm/s MV E velocity: 154.00 cm/s MV A velocity: 90.30 cm/s   SHUNTS MV E/A ratio:  1.71         Systemic VTI: 0.32 m Jerel Croitoru MD Electronically signed by Jerel Balding MD Signature Date/Time: 08/16/2023/12:57:20 PM    Final    DG Chest 2 View Result Date: 08/15/2023 CLINICAL DATA:  Shortness of breath. EXAM: CHEST - 2 VIEW COMPARISON:  06/05/2023. FINDINGS: The heart is enlarged the mediastinal contour stable. There is atherosclerotic calcification of the aorta. The pulmonary vasculature is distended. Interstitial prominence is present bilaterally and mild airspace disease is  present at the left lung base. There are suspected trace bilateral pleural effusions. No pneumothorax is seen. Sternotomy wires are present. Degenerative changes are present in the thoracic spine. IMPRESSION: 1. Cardiomegaly with pulmonary vascular congestion. 2. Interstitial prominence bilaterally with mild airspace  disease at the left lung base, possible edema or infiltrate. 3. Suspected trace bilateral pleural effusions. Electronically Signed   By: Leita Birmingham M.D.   On: 08/15/2023 14:27        Scheduled Meds:  amLODipine   10 mg Oral Daily   aspirin  EC  81 mg Oral Q breakfast   carvedilol   25 mg Oral BID WC   clopidogrel   75 mg Oral Daily   enoxaparin  (LOVENOX ) injection  40 mg Subcutaneous Q24H   furosemide   60 mg Intravenous BID   insulin  aspart  0-15 Units Subcutaneous TID WC   insulin  aspart  0-5 Units Subcutaneous QHS   insulin  aspart  4 Units Subcutaneous TID WC   insulin  glargine-yfgn  23 Units Subcutaneous QHS   montelukast   10 mg Oral Q breakfast   oxybutynin   5 mg Oral QHS   pantoprazole   40 mg Oral Daily   potassium chloride   40 mEq Oral BID   rosuvastatin   40 mg Oral Daily   spironolactone   25 mg Oral Daily     LOS: 2 days    Time spent: 55 minutes    Kambre Messner JONETTA Fairly, DO [facility]  If 7PM-7AM, please contact night-coverage [email or web address] 08/17/2023, 11:23 AM

## 2023-08-17 NOTE — Plan of Care (Signed)
   Problem: Education: Goal: Knowledge of General Education information will improve Description: Including pain rating scale, medication(s)/side effects and non-pharmacologic comfort measures Outcome: Progressing   Problem: Clinical Measurements: Goal: Ability to maintain clinical measurements within normal limits will improve Outcome: Progressing Goal: Diagnostic test results will improve Outcome: Progressing

## 2023-08-17 NOTE — Progress Notes (Signed)
 Heart Failure Nurse Navigator Progress Note  PCP: Lavell Bari LABOR, FNP PCP-Cardiologist: Deatrice Cage, MD Admission Diagnosis:  Acute on chronic congestive heart failure, unspecified heart failure type (HCC) Acute hypoxic respiratory failure [facility]) Admitted from: Home  *[facility] patient identifiers confirmed prior to patient interview and heart failure education conducted remotely via telephone to patients room 339.  RN to deliver a HF folder to her bedside.  Presentation:   Leah Ferrell presented with shortness of breath worsening over the last 2-3 weeks. Patient also reported worsening of leg swelling and overall weigh gain. Admitted for acute hypoxemic respiratory secondary to acute on chronic HFpEF. Hx:CHF, Peripheral vascular disease, Hypertension, Hyperlipidemia, GERD, Diabetes, NSTEMI with stenting and three-vessel CABG. BNP 269. HS-Troponin 19. Chest x-ray:cardiomegaly with pulmonary vascular congestion.  Interstitial prominence bilaterally with mild airspace disease at the the left lung base, possible edema or infiltrate.  Suspected trace bilateral pleural effusions.  ECHO/ LVEF: 60-65%. Mild Concentric left ventricular hypertrophy.  Grade II diastolic dysfunction (pseudonormalization) Elevated left atrial pressure.  Left atrial size was mildly dilated. Mitral valve is degenerative.  Trivial MV regurgitation.  Severe mitral annular calcification.  Clinical Course:  Past Medical History:  Diagnosis Date   CAD (coronary artery disease)    a. 04/2012 NSTEMI: s/p DES to LAD.   Chronic diastolic CHF (congestive heart failure) (HCC)    Diabetes mellitus without complication (HCC)    Dysrhythmia    Environmental and seasonal allergies    Hyperlipidemia    Hypertension    Leukocytosis    Morbid obesity (HCC)    NSTEMI (non-ST elevated myocardial infarction) (HCC) 04/27/2012   PONV (postoperative nausea and vomiting)    PVD (peripheral vascular disease) (HCC)     a. 04/2012 ABI: R 0.49, L 0.39. Angiography 06/14: Significant ostial left common iliac artery stenosis extending into the distal aorta, severe left common femoral artery stenosis, bilateral SFA occlusion with heavy calcifications. Functionally, one-vessel runoff bilaterally below the knee   Renal artery stenosis (HCC)    s/p angioplasty/stenting bilateral renal arteries 03/16/19   Shortness of breath      Social History   Socioeconomic History   Marital status: Widowed    Spouse name: Not on file   Number of children: 4   Years of education: completed school in [place]    Highest education level: Not on file  Occupational History   Occupation: retired  Tobacco Use   Smoking status: Former    Current packs/day: 0.00    Average packs/day: 1 pack/day for 36.8 years (36.8 ttl pk-yrs)    Types: Cigarettes    Start date: 04/21/1964    Quit date: 02/17/2001    Years since quitting: 22.5   Smokeless tobacco: Never  Vaping Use   Vaping status: Never Used  Substance and Sexual Activity   Alcohol  use: No    Alcohol /week: 0.0 standard drinks of alcohol    Drug use: No   Sexual activity: Not Currently    Birth control/protection: Post-menopausal  Other Topics Concern   Not on file  Social History Narrative   Lives alone - one level - all of her children live far away   Social Drivers of Health   Financial Resource Strain: Medium Risk (08/17/2023)   Overall Financial Resource Strain (CARDIA)    Difficulty of Paying Living Expenses: Somewhat hard  Food Insecurity: No Food Insecurity (08/17/2023)   Hunger Vital Sign    Worried About Running Out of Food in the Last Year: Never  true    Ran Out of Food in the Last Year: Never true  Transportation Needs: No Transportation Needs (08/17/2023)   PRAPARE - Administrator, Civil Service (Medical): No    Lack of Transportation (Non-Medical): No  Physical Activity: Insufficiently Active (12/25/2022)   Exercise Vital Sign    Days of  Exercise per Week: 3 days    Minutes of Exercise per Session: 30 min  Stress: No Stress Concern Present (12/25/2022)   [employer] of Occupational Health - Occupational Stress Questionnaire    Feeling of Stress : Not at all  Social Connections: Moderately Isolated (08/15/2023)   Social Connection and Isolation Panel    Frequency of Communication with Friends and Family: More than three times a week    Frequency of Social Gatherings with Friends and Family: More than three times a week    Attends Religious Services: 1 to 4 times per year    Active Member of [employer] or Organizations: No    Attends Banker Meetings: Never    Marital Status: Widowed   Education Assessment and Provision:  Detailed education and instructions provided on heart failure disease management including the following:  Signs and symptoms of Heart Failure When to call the physician Importance of daily weights Low sodium diet Fluid restriction Medication management Anticipated future follow-up appointments  Patient education given on each of the above topics.  Patient acknowledges understanding via teach back method and acceptance of all instructions.  Education Materials:  Living Better With Heart Failure Booklet, HF zone tool, & Daily Weight Tracker Tool.  Patient has scale at home: Yes Patient has pill box at home: Yes    High Risk Criteria for Readmission and/or Poor Patient Outcomes: Heart failure hospital admissions (last 6 months): 2  No Show rate: 2% Difficult social situation: None Demonstrates medication adherence: Yes Primary Language: English Literacy level: Reading,Writing & Comprehension  Barriers of Care:   None  Considerations/Referrals:   Referral made to Heart Failure Pharmacist Stewardship: Yes Referral made to Heart Failure CSW/NCM TOC: No Referral made to Heart & Vascular TOC clinic: Yes. 08/26/2023 @ 9:30 AM [facility]  Items for Follow-up on  DC/TOC: Diet & Fluid Restrictions Daily Weights Continued Heart Failure Education  Charmaine Pines, RN, BSN [facility] Heart Failure Navigator Secure Chat Only

## 2023-08-17 NOTE — Inpatient Diabetes Management (Signed)
 Inpatient Diabetes Program Recommendations  AACE/ADA: New Consensus Statement on Inpatient Glycemic Control   Target Ranges:  Prepandial:   less than 140 mg/dL      Peak postprandial:   less than 180 mg/dL (1-2 hours)      Critically ill patients:  140 - 180 mg/dL    Latest Reference Range & Units 08/16/23 07:30 08/16/23 11:26 08/16/23 16:18 08/16/23 19:41  Glucose-Capillary 70 - 99 mg/dL 844 (H) 733 (H) 775 (H) 256 (H)    Review of Glycemic Control  Diabetes history: DM2 Outpatient Diabetes medications: Tresiba  20 units at bedtime, Humalog  15-30 units TID with meals, Ozempic  2 mg Qweek Current orders for Inpatient glycemic control: Semglee  20 units at bedtime, Novolog  0-15 units TID with meals, Novolog  0-5 units QHS  Inpatient Diabetes Program Recommendations:    Insulin : Please consider increasing Semglee  to 23 units at bedtime and adding Novolog  4 units TID with meals for meal coverage if patient eats at least 50% of meals.  Thanks, Earnie Gainer, RN, MSN, CDCES Diabetes Coordinator Inpatient Diabetes Program [phone] (Team Pager from 8am to 5pm)

## 2023-08-18 DIAGNOSIS — I10 Essential (primary) hypertension: Secondary | ICD-10-CM

## 2023-08-18 DIAGNOSIS — I25708 Atherosclerosis of coronary artery bypass graft(s), unspecified, with other forms of angina pectoris: Secondary | ICD-10-CM

## 2023-08-18 DIAGNOSIS — K219 Gastro-esophageal reflux disease without esophagitis: Secondary | ICD-10-CM

## 2023-08-18 DIAGNOSIS — E1151 Type 2 diabetes mellitus with diabetic peripheral angiopathy without gangrene: Secondary | ICD-10-CM

## 2023-08-18 DIAGNOSIS — J9601 Acute respiratory failure with hypoxia: Secondary | ICD-10-CM | POA: Diagnosis not present

## 2023-08-18 DIAGNOSIS — I5033 Acute on chronic diastolic (congestive) heart failure: Secondary | ICD-10-CM

## 2023-08-18 LAB — BASIC METABOLIC PANEL WITH GFR
Anion gap: 10 (ref 5–15)
BUN: 18 mg/dL (ref 8–23)
CO2: 30 mmol/L (ref 22–32)
Calcium: 9.4 mg/dL (ref 8.9–10.3)
Chloride: 95 mmol/L — ABNORMAL LOW (ref 98–111)
Creatinine, Ser: 0.8 mg/dL (ref 0.44–1.00)
GFR, Estimated: >60 mL/min (ref >60)
Glucose, Bld: 197 mg/dL — ABNORMAL HIGH (ref 70–99)
Potassium: 4.5 mmol/L (ref 3.5–5.1)
Sodium: 135 mmol/L (ref 135–145)

## 2023-08-18 LAB — GLUCOSE, CAPILLARY
Glucose-Capillary: 233 mg/dL — ABNORMAL HIGH (ref 70–99)
Glucose-Capillary: 309 mg/dL — ABNORMAL HIGH (ref 70–99)
Glucose-Capillary: 201 mg/dL — ABNORMAL HIGH (ref 70–99)
Glucose-Capillary: 243 mg/dL — ABNORMAL HIGH (ref 70–99)

## 2023-08-18 LAB — MAGNESIUM: Magnesium: 2.2 mg/dL (ref 1.7–2.4)

## 2023-08-18 MED ORDER — LOSARTAN POTASSIUM 50 MG PO TABS
50.0000 mg | ORAL_TABLET | Freq: Every day | ORAL | Status: DC
  Administered 2023-08-18 – 2023-08-19 (×2): 50 mg via ORAL
  Filled 2023-08-18: qty 1

## 2023-08-18 MED ORDER — DAPAGLIFLOZIN PROPANEDIOL 10 MG PO TABS
10.0000 mg | ORAL_TABLET | Freq: Every day | ORAL | Status: DC
  Administered 2023-08-18 – 2023-08-19 (×2): 10 mg via ORAL
  Filled 2023-08-18: qty 1

## 2023-08-18 NOTE — Progress Notes (Signed)
   08/18/23 1000  ReDS Vest / Clip  Station Marker B  Ruler Value 34  ReDS Value Range 36 - 40  ReDS Actual Value 40  Anatomical Comments upright

## 2023-08-18 NOTE — Plan of Care (Signed)
   Problem: Education: Goal: Knowledge of General Education information will improve Description: Including pain rating scale, medication(s)/side effects and non-pharmacologic comfort measures Outcome: Progressing   Problem: Clinical Measurements: Goal: Ability to maintain clinical measurements within normal limits will improve Outcome: Progressing Goal: Diagnostic test results will improve Outcome: Progressing

## 2023-08-18 NOTE — Plan of Care (Signed)

## 2023-08-18 NOTE — Telephone Encounter (Signed)
 Please schedule her an appointment with Leah Ferrell for medication assistance.

## 2023-08-18 NOTE — Care Management Important Message (Signed)
 Important Message  Patient Details  Name: Leah Ferrell MRN: [id number] Date of Birth: 1950/03/08   Important Message Given:  N/A - LOS <3 / Initial given by admissions     [name] Ada 08/18/2023, 10:48 AM

## 2023-08-18 NOTE — Progress Notes (Signed)
 PROGRESS NOTE    VIVIANNA Ferrell  FMW:[id number] DOB: 1950-09-08 DOA: 08/15/2023 PCP: Lavell Bari LABOR, FNP   Brief Narrative:    Leah Ferrell is a 73 y.o. female with medical history significant of diabetes, chronic diastolic heart failure with coronary artery disease, history of NSTEMI with stenting and three-vessel CABG, peripheral vascular disease, hypertension, hyperlipidemia.  Patient presents with shortness of breath over the past 2 to 3 weeks.  She has been admitted for acute hypoxemic respiratory failure secondary to acute on chronic HFpEF.  She continues to be diuresing well and requires another day or two to continue until euvolemic.  Assessment & Plan:   Principal Problem:   Acute respiratory failure with hypoxia (HCC) Active Problems:   Essential hypertension   Diabetes mellitus with peripheral vascular disease (HCC)   CAD (coronary artery disease)   S/P CABG x 3   GERD (gastroesophageal reflux disease)  Assessment and Plan:  Acute hypoxemic respiratory failure secondary to acute on chronic HFpEF Prior 2D echocardiogram 05/2023 with LVEF 60-65% Continue to follow daily weights, strict I's and O's and low-sodium diet. Continue Lasix  60 mg IV twice daily for another 24 hours. Continue to follow electrolytes and replete as needed with intention to maintain potassium above 4 and magnesium  above 2 as much as possible. Echo with LVEF 60-65% grade 2 diastolic dysfunction noted on 3/70 Continue to follow daily basic metabolic panel. Follows with Dr. Darron as an outpatient Continue spironolactone , start losartan  and Farxiga  Reds clip on today's evaluation with lobe voltage. Coronary artery disease, status post CABG x 3 Continue statin Heart healthy diet discussed with patient. Diabetes with hyperglycemia Continue to follow CBGs fluctuation and further adjust hypoglycemic regimen. SSI and CBG AC & at bedtime Continue adjusted doses of Semglee ; patient started on  Farxiga . Hypertension Continue current antihypertensive agents; patient advised to follow heart healthy/low-sodium diet. GERD: Continue PPI. Class II obesity BMI 35.48; low calorie diet, portion control and increase physical activity discussed with patient.    DVT prophylaxis:Lovenox  Code Status: Full Family Communication: None at bedside Disposition Plan:  Status is: Inpatient Remains inpatient appropriate because: Need for IV medications   Consultants:  None  Procedures:  None  Antimicrobials:  None   Subjective: Patient reporting mild orthopnea symptoms unsure when the sensation with activity.  Denies chest pain, no nausea, no vomiting.  Good saturation on room air.  Objective: Vitals:   08/17/23 1320 08/17/23 1955 08/18/23 0438 08/18/23 0442  BP: (!) 186/54 (!) 161/51 (!) 165/42   Pulse: (!) 52 (!) 50 (!) 55   Resp: 18 14 14    Temp: 98.3 F (36.8 C) 98.1 F (36.7 C) 98.5 F (36.9 C)   TempSrc: Oral Oral Oral   SpO2: 100% 96% 93%   Weight:    88 kg  Height:        Intake/Output Summary (Last 24 hours) at 08/18/2023 1817 Last data filed at 08/18/2023 1700 Gross per 24 hour  Intake 720 ml  Output 3850 ml  Net -3130 ml   Filed Weights   08/16/23 0526 08/17/23 0421 08/18/23 0442  Weight: 88 kg 88.1 kg 88 kg    Examination: General exam: Alert, awake, oriented x 3; reporting feeling short winded with activity and having mild orthopnea.  No chest pain, no nausea, no vomiting. Respiratory system: No using accessory muscles; good saturation on room air.  Decreased breath sounds at the bases. Cardiovascular system:RRR. No rubs or gallops; no JVD on exam. Gastrointestinal system: Abdomen  is nondistended, soft and nontender. No organomegaly or masses felt. Normal bowel sounds heard. Central nervous system:  No focal neurological deficits. Extremities: No cyanosis or clubbing; no edema on exam. Skin: No rashes, no petechiae. Psychiatry: Judgement and insight appear  normal. Mood & affect appropriate.   Data Reviewed: I have personally reviewed following labs and imaging studies  CBC: Recent Labs  Lab 08/15/23 1422  WBC 9.2  HGB 12.3  HCT 39.1  MCV 96.5  PLT 196   Basic Metabolic Panel: Recent Labs  Lab 08/15/23 1422 08/16/23 0334 08/17/23 0433 08/18/23 0450  NA 139 134* 135 135  K 3.6 3.1* 3.9 4.5  CL 97* 93* 95* 95*  CO2 31 31 28 30   GLUCOSE 237* 191* 249* 197*  BUN 12 13 14 18   CREATININE 0.71 0.70 0.77 0.80  CALCIUM  9.2 8.6* 9.1 9.4  MG  --   --   --  2.2   GFR: Estimated Creatinine Clearance: 65.5 mL/min (by C-G formula based on SCr of 0.8 mg/dL).  Liver Function Tests: Recent Labs  Lab 08/15/23 1435  AST 18  ALT 19  ALKPHOS 63  BILITOT 0.7  PROT 7.7  ALBUMIN  3.5   CBG: Recent Labs  Lab 08/17/23 1547 08/17/23 1957 08/18/23 0730 08/18/23 1141 08/18/23 1619  GLUCAP 225* 254* 233* 309* 201*   Scheduled Meds:  aspirin  EC  81 mg Oral Q breakfast   carvedilol   25 mg Oral BID WC   clopidogrel   75 mg Oral Daily   dapagliflozin  propanediol  10 mg Oral Daily   enoxaparin  (LOVENOX ) injection  40 mg Subcutaneous Q24H   furosemide   60 mg Intravenous BID   insulin  aspart  0-15 Units Subcutaneous TID WC   insulin  aspart  0-5 Units Subcutaneous QHS   insulin  aspart  4 Units Subcutaneous TID WC   insulin  glargine-yfgn  23 Units Subcutaneous QHS   losartan   50 mg Oral Daily   montelukast   10 mg Oral Q breakfast   oxybutynin   5 mg Oral QHS   pantoprazole   40 mg Oral Daily   potassium chloride   40 mEq Oral BID   rosuvastatin   40 mg Oral Daily   spironolactone   25 mg Oral Daily     LOS: 3 days    Time spent: 50 minutes    Eric Nunnery, MD [facility]  If 7PM-7AM, please contact night-coverage [email or web address] 08/18/2023, 6:17 PM

## 2023-08-19 ENCOUNTER — Ambulatory Visit: Payer: Self-pay

## 2023-08-19 DIAGNOSIS — J9601 Acute respiratory failure with hypoxia: Secondary | ICD-10-CM | POA: Diagnosis not present

## 2023-08-19 DIAGNOSIS — Z951 Presence of aortocoronary bypass graft: Secondary | ICD-10-CM

## 2023-08-19 DIAGNOSIS — K219 Gastro-esophageal reflux disease without esophagitis: Secondary | ICD-10-CM | POA: Diagnosis not present

## 2023-08-19 DIAGNOSIS — E1151 Type 2 diabetes mellitus with diabetic peripheral angiopathy without gangrene: Secondary | ICD-10-CM | POA: Diagnosis not present

## 2023-08-19 DIAGNOSIS — I10 Essential (primary) hypertension: Secondary | ICD-10-CM | POA: Diagnosis not present

## 2023-08-19 LAB — BASIC METABOLIC PANEL WITH GFR
Anion gap: 7 (ref 5–15)
BUN: 25 mg/dL — ABNORMAL HIGH (ref 8–23)
CO2: 30 mmol/L (ref 22–32)
Calcium: 9.4 mg/dL (ref 8.9–10.3)
Chloride: 96 mmol/L — ABNORMAL LOW (ref 98–111)
Creatinine, Ser: 1.05 mg/dL — ABNORMAL HIGH (ref 0.44–1.00)
GFR, Estimated: 56 mL/min — ABNORMAL LOW (ref >60)
Glucose, Bld: 221 mg/dL — ABNORMAL HIGH (ref 70–99)
Potassium: 4.3 mmol/L (ref 3.5–5.1)
Sodium: 133 mmol/L — ABNORMAL LOW (ref 135–145)

## 2023-08-19 LAB — GLUCOSE, CAPILLARY
Glucose-Capillary: 207 mg/dL — ABNORMAL HIGH (ref 70–99)
Glucose-Capillary: 296 mg/dL — ABNORMAL HIGH (ref 70–99)

## 2023-08-19 MED ORDER — POLYETHYLENE GLYCOL 3350 17 G PO PACK: 17.0000 g | PACK | Freq: Every day | ORAL | Status: AC | PRN

## 2023-08-19 MED ORDER — LOSARTAN POTASSIUM 50 MG PO TABS: 50.0000 mg | ORAL_TABLET | Freq: Every day | ORAL | 1 refills | Status: DC

## 2023-08-19 MED ORDER — HUMALOG KWIKPEN 200 UNIT/ML ~~LOC~~ SOPN: 15.0000 [IU] | PEN_INJECTOR | Freq: Three times a day (TID) | SUBCUTANEOUS | Status: DC

## 2023-08-19 MED ORDER — FUROSEMIDE 40 MG PO TABS: 40.0000 mg | ORAL_TABLET | Freq: Two times a day (BID) | ORAL | 3 refills | Status: DC

## 2023-08-19 MED ORDER — SPIRONOLACTONE 25 MG PO TABS: 25.0000 mg | ORAL_TABLET | Freq: Every day | ORAL | 1 refills | Status: DC

## 2023-08-19 MED ORDER — TRESIBA FLEXTOUCH 100 UNIT/ML ~~LOC~~ SOPN: 20.0000 [IU] | PEN_INJECTOR | Freq: Every day | SUBCUTANEOUS | Status: DC

## 2023-08-19 MED ORDER — POTASSIUM CHLORIDE CRYS ER 20 MEQ PO TBCR: 20.0000 meq | EXTENDED_RELEASE_TABLET | Freq: Every day | ORAL | 1 refills | Status: AC

## 2023-08-19 NOTE — Telephone Encounter (Signed)
 FYI Only or Action Required?: FYI only for provider.  Patient was last seen in primary care on 08/11/2023 by Lavell Bari LABOR, FNP. Called Nurse Triage reporting No chief complaint on file.. Symptoms began today. Interventions attempted: Nothing. Symptoms are: unchanged.  Triage Disposition: Call PCP Now  Patient/caregiver understands and will follow disposition?: Yes  100/47 now 118 but was told her BP machine is calibrated wrong and to take away and HR was 57. Did scheduled pt for hospital f/u for next week. Instructed daughter on symptoms to watch for and when to call back in.     Copied from CRM [id number]. Topic: Clinical - Medical Advice >> Aug 19, 2023  4:01 PM Santiya F wrote: Reason for CRM: Patient's daughter is calling in because they just got home from the hospital and patient checked her blood pressure and she thought it was too high and took blood pressure medication and realized her blood pressure wasn't high and now they are concerned that patient's blood pressure will go down too low. Original BP reading was 100 Reason for Disposition  [1] Caller has URGENT medicine question about med that PCP or specialist prescribed AND [2] triager unable to answer question  Answer Assessment - Initial Assessment Questions 1. NAME of MEDICINE: What medicine(s) are you calling about?     coreg  2. QUESTION: What is your question? (e.g., double dose of medicine, side effect)     Pt took a dose around 1545 and her BP was 100/47 HR 57   3. PRESCRIBER: Who prescribed the medicine? Reason: if prescribed by specialist, call should be referred to that group.     Lavell, NP 4. SYMPTOMS: Do you have any symptoms? If Yes, ask: What symptoms are you having?  How bad are the symptoms (e.g., mild, moderate, severe)     Was having blurred vision when they first got home from Hospital, states vision still a little blurry but better than earlier, States her BG was 204.  Protocols used:  Medication Question Call-A-AH

## 2023-08-19 NOTE — TOC Initial Note (Signed)
 Transition of Care [facility]) - Initial/Assessment Note    Patient Details  Name: Leah Ferrell MRN: [id number] Date of Birth: [birth year]  [facility] CM/SW Contact:    Lucie Lunger, LCSWA Phone Number: 08/19/2023, 11:17 AM  Clinical Narrative:                 Pt is high risk for readmission. CSW spoke with pt to complete assessment. Pt states that she lives alone and is able to complete her ADLs independently. Pt states that she does not use any DME. Pt states that she is able to drive to appointments when needed. TOC to follow.   Expected Discharge Plan: Home/Self Care Barriers to Discharge: Continued Medical Work up   Patient Goals and CMS Choice Patient states their goals for this hospitalization and ongoing recovery are:: reuturn home CMS Medicare.gov Compare Post Acute Care list provided to:: Patient Choice offered to / list presented to : Patient      Expected Discharge Plan and Services In-house Referral: Clinical Social Work Discharge Planning Services: CM Consult   Living arrangements for the past 2 months: Single Family Home                                      Prior Living Arrangements/Services Living arrangements for the past 2 months: Single Family Home Lives with:: Self Patient language and need for interpreter reviewed:: Yes Do you feel safe going back to the place where you live?: Yes      Need for Family Participation in Patient Care: Yes (Comment) Care giver support system in place?: Yes (comment) Current home services: DME Criminal Activity/Legal Involvement Pertinent to Current Situation/Hospitalization: No - Comment as needed  Activities of Daily Living   ADL Screening (condition at time of admission) Independently performs ADLs?: Yes (appropriate for developmental age) Is the patient deaf or have difficulty hearing?: No Does the patient have difficulty seeing, even when wearing glasses/contacts?: No Does the patient have  difficulty concentrating, remembering, or making decisions?: No  Permission Sought/Granted                  Emotional Assessment Appearance:: Appears stated age Attitude/Demeanor/Rapport: Engaged Affect (typically observed): Accepting Orientation: : Oriented to Self, Oriented to Place, Oriented to  Time, Oriented to Situation Alcohol  / Substance Use: Not Applicable Psych Involvement: No (comment)  Admission diagnosis:  Acute respiratory failure with hypoxia (HCC) [J96.01] Acute on chronic congestive heart failure, unspecified heart failure type (HCC) [I50.9] Acute hypoxic respiratory failure (HCC) [J96.01] Patient Active Problem List   Diagnosis Date Noted   Acute respiratory failure with hypoxia (HCC) 06/05/2023   GERD (gastroesophageal reflux disease) 06/05/2023   Elevated troponin 04/06/2023   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   Critical lower limb ischemia (HCC) 05/09/2020   Renal artery stenosis (HCC)    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   Hypertensive urgency 12/13/2018   Osteopenia 04/27/2018   Acute on chronic diastolic CHF (congestive heart failure) (HCC) 12/02/2017   Dizziness 12/01/2017   Morbid obesity (HCC) 06/10/2017   PMB (postmenopausal bleeding) 11/12/2015   Hypokalemia 11/23/2014   Leg pain, left 07/06/2014   Vaginal atrophy 05/08/2014   Left leg swelling 01/17/2014   PAD (peripheral artery disease) (HCC) 10/18/2013   Abnormal stress test 07/18/2013   PVD (peripheral vascular disease) (HCC) 07/12/2013   Status post coronary artery stent placement 07/12/2013   Chronic diastolic heart  failure (HCC) 08/27/2012   Carotid bruit 08/27/2012   Angina pectoris (HCC) 04/30/2012   CAD (coronary artery disease) 04/28/2012   Hx of non-ST elevation myocardial infarction (NSTEMI) 04/23/2012   Essential hypertension    Diabetes mellitus with peripheral vascular disease (HCC)    Mixed hyperlipidemia    PCP:  Lavell Bari LABOR, FNP Pharmacy:   [facility] [address] [place] - [address] [place] [address] Phone: [phone] Fax: [phone]  [facility]) - [place], [place] - [address] [facility] [address] [place] [address] [place] [address] Phone: [phone] Fax: [phone]  [facility] [address] [place] [address] Phone: [phone] Fax: [phone]     Social Drivers of Health (SDOH) Social History: SDOH Screenings   Food Insecurity: No Food Insecurity (08/17/2023)  Housing: Unknown (08/17/2023)  Transportation Needs: No Transportation Needs (08/17/2023)  Utilities: Not At Risk (08/15/2023)  Alcohol  Screen: Low Risk  (12/25/2022)  Depression (PHQ2-9): Low Risk  (08/11/2023)  Financial Resource Strain: Medium Risk (08/17/2023)  Physical Activity: Insufficiently Active (12/25/2022)  Social Connections: Moderately Isolated (08/15/2023)  Stress: No Stress Concern Present (12/25/2022)  Tobacco Use: Medium Risk (08/17/2023)  Health Literacy: Adequate Health Literacy (12/25/2022)   SDOH Interventions: Food Insecurity Interventions: Intervention Not Indicated Housing Interventions: Intervention Not Indicated Transportation Interventions: Intervention Not Indicated Financial Strain Interventions: Intervention Not Indicated   Readmission Risk Interventions    08/19/2023   11:15 AM 08/16/2023    6:14 PM  Readmission Risk Prevention Plan  Transportation Screening Complete Complete  PCP or Specialist Appt within 5-7 Days  Complete  Home Care Screening  Complete  Medication Review (RN CM)  Complete  HRI or Home Care Consult Complete   Social Work Consult for Recovery Care Planning/Counseling Complete   Palliative Care Screening Not Applicable   Medication Review Oceanographer) Complete

## 2023-08-19 NOTE — Plan of Care (Signed)

## 2023-08-19 NOTE — Discharge Summary (Signed)
 Physician Discharge Summary   Patient: Leah Ferrell MRN: [id number] DOB: May 02, 1950  Admit date:     08/15/2023  Discharge date: 08/19/23  Discharge Physician: Eric Nunnery   PCP: Lavell Bari LABOR, FNP   Recommendations at discharge:  Reassess blood pressure and volume status with further adjustment to antihypertensive/diuretic regimen Continue assisting with weight loss management Repeat basic metabolic panel to follow electrolytes and renal function Continue adjusting patient's insulin  therapy based on CBG fluctuation and A1c levels.   Please make sure Patient follow-up with cardiology service as previously instructed/scheduled.   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 Barbra on 08/15/2023; briefly: Leah Ferrell is a 73 y.o. female with medical history significant of diabetes, chronic diastolic heart failure with coronary artery disease, history of NSTEMI with stenting and three-vessel CABG, peripheral vascular disease, hypertension, hyperlipidemia.  Patient presents with shortness of breath over the past 2 to 3 weeks.  She has been admitted for acute hypoxemic respiratory failure secondary to acute on chronic HFpEF.    Assessment and Plan: Acute hypoxemic respiratory failure secondary to acute on chronic HFpEF Prior 2D echocardiogram 05/2023 with LVEF 60-65% Continue to follow electrolytes and replete as needed with intention to maintain potassium above 4 and magnesium  above 2 as much as possible. Echo with LVEF 60-65% grade 2 diastolic dysfunction noted on 3/70 Follows with Dr. Darron as an outpatient (patient instructed to be compliant with outpatient visits). Continue  spironolactone , losartan  and adjusted dose of Lasix ; Farxiga  has been discontinue as per patient reports secondary to high risk for UTIs. Patient has been educated about low-sodium diet, adequate hydration, daily weights and medication compliance. Coronary artery disease, status post CABG x 3 Continue statin Heart healthy diet discussed with patient. Diabetes with hyperglycemia Continue to follow CBGs fluctuation and A1c with further adjust hypoglycemic regimen as required Modify carbohydrate diet and adequate hydration discussed with patient. Hypertension Continue current antihypertensive agents; patient advised to follow heart healthy/low-sodium diet. GERD: Patient reports no using PPI as an outpatient.  Lifestyle changes discussion and recommendation to follow-up with PCP/GI as an outpatient provided. Class II obesity BMI 35.48; low calorie diet, portion control and increase physical activity discussed with patient.   Consultants: None Procedures performed: See below for x-ray reports. Disposition: Home Diet recommendation: Heart healthy/low-sodium and modified carbohydrate diet.  DISCHARGE MEDICATION: Allergies as of 08/19/2023       Reactions   Tape Rash        Medication List     STOP taking these medications    amLODipine  10 MG tablet Commonly known as: NORVASC    dapagliflozin  propanediol 10 MG Tabs tablet Commonly known as: Farxiga    pantoprazole  40 MG tablet Commonly known as: PROTONIX        TAKE these medications    acetaminophen  325 MG tablet Commonly known as: TYLENOL  Take 2 tablets (650 mg total) by mouth every 6 (six) hours as needed for mild pain (pain score 1-3) (or Fever >/= 101).   albuterol  108 (90 Base) MCG/ACT inhaler Commonly known as: VENTOLIN  HFA Inhale 2 puffs into the lungs every 6 (six) hours as needed for shortness of breath or wheezing.   aspirin  EC 81 MG tablet Take 1 tablet (  81 mg total) by mouth daily with breakfast. In the  morning   bismuth subsalicylate 262 MG/15ML suspension Commonly known as: PEPTO BISMOL Take 30 mLs by mouth every 6 (six) hours as needed for indigestion or diarrhea or loose stools.   blood glucose meter kit and supplies Dispense based on patient and insurance preference. Use up to four times daily as directed. (FOR ICD-10 E10.9, E11.9).   calcium  carbonate 1500 (600 Ca) MG Tabs tablet Commonly known as: OSCAL Take 600 mg of elemental calcium  by mouth 3 (three) times daily with meals.   Caltrate 600+D Plus Minerals 600-800 MG-UNIT Tabs Take 1 tablet by mouth 3 (three) times daily.   carvedilol  25 MG tablet Commonly known as: COREG  Take 1 tablet (25 mg total) by mouth 2 (two) times daily with a meal.   clopidogrel  75 MG tablet Commonly known as: PLAVIX  Take 1 tablet (75 mg total) by mouth daily.   CORICIDIN HBP COLD/FLU PO Take 1 tablet by mouth daily as needed (cough/cold).   fish oil-omega-3 fatty acids  1000 MG capsule Take 1 g by mouth daily.   furosemide  40 MG tablet Commonly known as: Lasix  Take 1 tablet (40 mg total) by mouth 2 (two) times daily. What changed: when to take this   Glucose Meter Test test strip Generic drug: glucose blood Use 6-10 times a day, Uses ReliOn   HumaLOG  KwikPen 200 UNIT/ML KwikPen Generic drug: insulin  lispro Inject 15-30 Units into the skin 3 (three) times daily before meals.   hydroxypropyl methylcellulose / hypromellose 2.5 % ophthalmic solution Commonly known as: ISOPTO TEARS / GONIOVISC Place 1 drop into both eyes 3 (three) times daily as needed (for dry eyes).   Insulin  Syringe 27G X 1/2 0.5 ML Misc Use with Fiasp  insulin  TID   losartan  50 MG tablet Commonly known as: COZAAR  Take 1 tablet (50 mg total) by mouth daily. Start taking on: August 20, 2023   montelukast  10 MG tablet Commonly known as: SINGULAIR  Take 1 tablet by mouth once daily with breakfast   nitroGLYCERIN  0.4 MG SL tablet Commonly known as: Nitrostat  Place  1 tablet (0.4 mg total) under the tongue every 5 (five) minutes as needed for chest pain.   oxybutynin  5 MG 24 hr tablet Commonly known as: DITROPAN -XL Take 1 tablet (5 mg total) by mouth at bedtime.   Ozempic  (2 MG/DOSE) 8 MG/3ML Sopn Generic drug: Semaglutide  (2 MG/DOSE) Inject 2 mg into the skin once a week.   polyethylene glycol 17 g packet Commonly known as: MIRALAX  / GLYCOLAX  Take 17 g by mouth daily as needed. What changed:  when to take this reasons to take this   potassium chloride  SA 20 MEQ tablet Commonly known as: KLOR-CON  M Take 1 tablet (20 mEq total) by mouth daily.   ReliOn Pen Needles 29G X Misc Generic drug: Insulin  Pen Needle USE 1 NEEDLE TO INJECT INSULIN  SUBCUTANEOUSLY 3 TIMES DAILY   BD ULTRA-FINE PEN NEEDLES 29G X 12.7MM Misc Generic drug: Insulin  Pen Needle USE PENNEEDLES 3 TIMES  DAILY AS DIRECTED   rosuvastatin  40 MG tablet Commonly known as: CRESTOR  Take 1 tablet by mouth once daily   spironolactone  25 MG tablet Commonly known as: ALDACTONE  Take 1 tablet (25 mg total) by mouth daily. Start taking on: August 20, 2023   Tresiba  FlexTouch 100 UNIT/ML FlexTouch Pen Generic drug: insulin  degludec Inject 20 Units into the skin at bedtime.   vitamin C 1000 MG tablet Take 1,000 mg by mouth 3 (three)  times daily.   Vitamin D -3 125 MCG (5000 UT) Tabs Take 5,000 Units by mouth daily.        Follow-up Information     Lavell Bari LABOR, FNP. Schedule an appointment as soon as possible for a visit in 10 day(s).   Specialty: Family Medicine Contact information: [address] [place] [address] [phone]                Discharge Exam: Fredricka Weights   08/17/23 0421 08/18/23 0442 08/19/23 0431  Weight: 88.1 kg 88 kg 88.2 kg   General exam: Alert, awake, oriented x 3; no requiring oxygen supplementation and denying orthopnea. Respiratory system: Good saturation on room air; no using accessory muscles, no wheezing or  crackles on exam. Cardiovascular system:RRR. No murmurs, rubs, gallops. Gastrointestinal system: Abdomen is obese, nondistended, soft and nontender. No organomegaly or masses felt. Normal bowel sounds heard. Central nervous system: No focal neurological deficits. Extremities: No cyanosis or clubbing; trace edema appreciated bilaterally. Skin: No rashes, lesions or ulcers Psychiatry: Judgement and insight appear normal. Mood & affect appropriate.    Condition at discharge: Stable and improved.  The results of significant diagnostics from this hospitalization (including imaging, microbiology, ancillary and laboratory) are listed below for reference.   Imaging Studies: ECHOCARDIOGRAM LIMITED Result Date: 08/16/2023    ECHOCARDIOGRAM LIMITED REPORT   Patient Name:   KATHERYNE GORR Date of Exam: 08/16/2023 Medical Rec #:  [id number]         Height:       62.0 in Accession #:    [id number]        Weight:       194.0 lb Date of Birth:  31-May-1950          BSA:          1.887 m Patient Age:    73 years          BP:           155/50 mmHg Patient Gender: F                 HR:           53 bpm. Exam Location:  [facility] Procedure: 2D Echo, Cardiac Doppler and Limited Color Doppler (Both Spectral and            Color Flow Doppler were utilized during procedure). Indications:    I50.9* Heart failure (unspecified)  History:        Patient has prior history of Echocardiogram examinations, most                 recent 06/06/2023.  Sonographer:    Eva Lash Referring Phys: [phone] JACOB J STINSON IMPRESSIONS  1. Left ventricular ejection fraction, by estimation, is 60 to 65%. The left ventricle has normal function. The left ventricle has no regional wall motion abnormalities. There is mild concentric left ventricular hypertrophy. Left ventricular diastolic parameters are consistent with Grade II diastolic dysfunction (pseudonormalization). Elevated left atrial pressure.  2. Right ventricular systolic function is normal.  The right ventricular size is normal.  3. Left atrial size was mildly dilated.  4. The mitral valve is degenerative. Trivial mitral valve regurgitation. No evidence of mitral stenosis. Severe mitral annular calcification.  5. The inferior vena cava is dilated in size with <50% respiratory variability, suggesting right atrial pressure of 15 mmHg. Comparison(s): No significant change from prior study. Prior images reviewed side by side. FINDINGS  Left Ventricle: Left ventricular  ejection fraction, by estimation, is 60 to 65%. The left ventricle has normal function. The left ventricle has no regional wall motion abnormalities. The left ventricular internal cavity size was normal in size. There is  mild concentric left ventricular hypertrophy. Left ventricular diastolic parameters are consistent with Grade II diastolic dysfunction (pseudonormalization). Elevated left atrial pressure. Right Ventricle: The right ventricular size is normal. No increase in right ventricular wall thickness. Right ventricular systolic function is normal. Left Atrium: Left atrial size was mildly dilated. Right Atrium: Right atrial size was normal in size. Mitral Valve: The mitral valve is degenerative in appearance. Severe mitral annular calcification. Trivial mitral valve regurgitation. No evidence of mitral valve stenosis. Tricuspid Valve: Tricuspid valve regurgitation is mild. Aortic Valve: Aortic valve mean gradient measures 6.0 mmHg. Aortic valve peak gradient measures 12.5 mmHg. Venous: The inferior vena cava is dilated in size with less than 50% respiratory variability, suggesting right atrial pressure of 15 mmHg. IAS/Shunts: No atrial level shunt detected by color flow Doppler.  LV Volumes (MOD) LV vol d, MOD A2C: 120.0 ml Diastology LV vol d, MOD A4C: 114.0 ml LV e' medial:    3.81 cm/s LV vol s, MOD A2C: 32.6 ml  LV E/e' medial:  40.4 LV vol s, MOD A4C: 41.4 ml  LV e' lateral:   6.96 cm/s LV SV MOD A2C:     87.4 ml  LV E/e' lateral:  22.1 LV SV MOD A4C:     114.0 ml LV SV MOD BP:      81.4 ml RIGHT VENTRICLE RV S prime:     8.27 cm/s TAPSE (M-mode): 1.2 cm AORTIC VALVE AV Vmax:           177.00 cm/s AV Vmean:          113.000 cm/s AV VTI:            0.496 m AV Peak Grad:      12.5 mmHg AV Mean Grad:      6.0 mmHg LVOT Vmax:         109.00 cm/s LVOT Vmean:        68.500 cm/s LVOT VTI:          0.318 m LVOT/AV VTI ratio: 0.64 MITRAL VALVE                TRICUSPID VALVE MV Area (PHT): 3.37 cm     TR Peak grad:   28.1 mmHg MV Decel Time: 225 msec     TR Vmax:        265.00 cm/s MV E velocity: 154.00 cm/s MV A velocity: 90.30 cm/s   SHUNTS MV E/A ratio:  1.71         Systemic VTI: 0.32 m Jerel Croitoru MD Electronically signed by Jerel Balding MD Signature Date/Time: 08/16/2023/12:57:20 PM    Final    DG Chest 2 View Result Date: 08/15/2023 CLINICAL DATA:  Shortness of breath. EXAM: CHEST - 2 VIEW COMPARISON:  06/05/2023. FINDINGS: The heart is enlarged the mediastinal contour stable. There is atherosclerotic calcification of the aorta. The pulmonary vasculature is distended. Interstitial prominence is present bilaterally and mild airspace disease is present at the left lung base. There are suspected trace bilateral pleural effusions. No pneumothorax is seen. Sternotomy wires are present. Degenerative changes are present in the thoracic spine. IMPRESSION: 1. Cardiomegaly with pulmonary vascular congestion. 2. Interstitial prominence bilaterally with mild airspace disease at the left lung base, possible edema or infiltrate. 3. Suspected trace bilateral pleural effusions.  Electronically Signed   By: Leita Birmingham M.D.   On: 08/15/2023 14:27    Microbiology: Results for orders placed or performed during the hospital encounter of 04/06/23  MRSA Next Gen by PCR, Nasal     Status: None   Collection Time: 04/06/23 10:58 PM   Specimen: Nasal Mucosa; Nasal Swab  Result Value Ref Range Status   MRSA by PCR Next Gen NOT DETECTED NOT DETECTED Final     Comment: (NOTE) The GeneXpert MRSA Assay (FDA approved for NASAL specimens only), is one component of a comprehensive MRSA colonization surveillance program. It is not intended to diagnose MRSA infection nor to guide or monitor treatment for MRSA infections. Test performance is not FDA approved in patients less than 29 years old. Performed at [facility], [address] [place], [place] [address]    *Note: Due to a large number of results and/or encounters for the requested time period, some results have not been displayed. A complete set of results can be found in Results Review.    Labs: CBC: Recent Labs  Lab 08/15/23 1422  WBC 9.2  HGB 12.3  HCT 39.1  MCV 96.5  PLT 196   Basic Metabolic Panel: Recent Labs  Lab 08/15/23 1422 08/16/23 0334 08/17/23 0433 08/18/23 0450 08/19/23 0517  NA 139 134* 135 135 133*  K 3.6 3.1* 3.9 4.5 4.3  CL 97* 93* 95* 95* 96*  CO2 31 31 28 30 30   GLUCOSE 237* 191* 249* 197* 221*  BUN 12 13 14 18  25*  CREATININE 0.71 0.70 0.77 0.80 1.05*  CALCIUM  9.2 8.6* 9.1 9.4 9.4  MG  --   --   --  2.2  --    Liver Function Tests: Recent Labs  Lab 08/15/23 1435  AST 18  ALT 19  ALKPHOS 63  BILITOT 0.7  PROT 7.7  ALBUMIN  3.5   CBG: Recent Labs  Lab 08/18/23 1141 08/18/23 1619 08/18/23 2011 08/19/23 0719 08/19/23 1111  GLUCAP 309* 201* 243* 207* 296*    Discharge time spent: greater than 30 minutes.  Signed: Eric Nunnery, MD [facility] 08/19/2023

## 2023-08-20 ENCOUNTER — Telehealth: Payer: Self-pay | Admitting: *Deleted

## 2023-08-20 ENCOUNTER — Encounter (HOSPITAL_COMMUNITY): Payer: Self-pay | Admitting: *Deleted

## 2023-08-20 DIAGNOSIS — I739 Peripheral vascular disease, unspecified: Secondary | ICD-10-CM

## 2023-08-20 NOTE — Progress Notes (Signed)
 PAT/SET Individual Treatment Plan  Patient Details  Name: Leah Ferrell MRN: [id number] Date of Birth: Jan 06, 1951 Referring Provider:   Conrad Ports Supervised Exercise Therapy from 05/25/2023 in [facility] CARDIAC REH[facility]ATION  Referring Provider Darron Grass MD    Initial Encounter Date:  Flowsheet Row Supervised Exercise Therapy from 05/25/2023 in [facility]  Date 05/25/23    Visit Diagnosis: PAD (peripheral artery disease) (HCC)  Patient's Home Medications on Admission:  Current Outpatient Medications:    acetaminophen  (TYLENOL ) 325 MG tablet, Take 2 tablets (650 mg total) by mouth every 6 (six) hours as needed for mild pain (pain score 1-3) (or Fever >/= 101)., Disp: , Rfl:    albuterol  (VENTOLIN  HFA) 108 (90 Base) MCG/ACT inhaler, Inhale 2 puffs into the lungs every 6 (six) hours as needed for shortness of breath or wheezing., Disp: 8 g, Rfl: 2   Ascorbic Acid  (VITAMIN C) 1000 MG tablet, Take 1,000 mg by mouth 3 (three) times daily. , Disp: , Rfl:    aspirin  EC 81 MG tablet, Take 1 tablet (81 mg total) by mouth daily with breakfast. In the morning, Disp: 30 tablet, Rfl: 11   BD ULTRA-FINE PEN NEEDLES 29G X 12.7MM MISC, USE PENNEEDLES 3 TIMES  DAILY AS DIRECTED, Disp: 270 each, Rfl: 1   bismuth subsalicylate (PEPTO BISMOL) 262 MG/15ML suspension, Take 30 mLs by mouth every 6 (six) hours as needed for indigestion or diarrhea or loose stools., Disp: , Rfl:    blood glucose meter kit and supplies, Dispense based on patient and insurance preference. Use up to four times daily as directed. (FOR ICD-10 E10.9, E11.9)., Disp: 1 each, Rfl: 0   calcium  carbonate (OSCAL) 1500 (600 Ca) MG TABS tablet, Take 600 mg of elemental calcium  by mouth 3 (three) times daily with meals., Disp: , Rfl:    Calcium  Carbonate-Vit D-Min (CALTRATE 600+D PLUS MINERALS) 600-800 MG-UNIT TABS, Take 1 tablet by mouth 3 (three) times daily. , Disp: , Rfl:    carvedilol  (COREG ) 25 MG tablet,  Take 1 tablet (25 mg total) by mouth 2 (two) times daily with a meal., Disp: 180 tablet, Rfl: 2   Chlorpheniramine-Acetaminophen  (CORICIDIN HBP COLD/FLU PO), Take 1 tablet by mouth daily as needed (cough/cold)., Disp: , Rfl:    Cholecalciferol  (VITAMIN D -3) 125 MCG (5000 UT) TABS, Take 5,000 Units by mouth daily., Disp: , Rfl:    clopidogrel  (PLAVIX ) 75 MG tablet, Take 1 tablet (75 mg total) by mouth daily., Disp: 90 tablet, Rfl: 3   fish oil-omega-3 fatty acids  1000 MG capsule, Take 1 g by mouth daily. , Disp: , Rfl:    furosemide  (LASIX ) 40 MG tablet, Take 1 tablet (40 mg total) by mouth 2 (two) times daily., Disp: 60 tablet, Rfl: 3   glucose blood (GLUCOSE METER TEST) test strip, Use 6-10 times a day, Uses ReliOn, Disp: 900 each, Rfl: 12   hydroxypropyl methylcellulose (ISOPTO TEARS) 2.5 % ophthalmic solution, Place 1 drop into both eyes 3 (three) times daily as needed (for dry eyes)., Disp: , Rfl:    insulin  degludec (TRESIBA  FLEXTOUCH) 100 UNIT/ML FlexTouch Pen, Inject 20 Units into the skin at bedtime., Disp: , Rfl:    insulin  lispro (HUMALOG  KWIKPEN) 200 UNIT/ML KwikPen, Inject 15-30 Units into the skin 3 (three) times daily before meals., Disp: , Rfl:    Insulin  Syringe 27G X 1/2 0.5 ML MISC, Use with Fiasp  insulin  TID, Disp: 100 each, Rfl: 3   losartan  (COZAAR ) 50 MG tablet, Take 1  tablet (50 mg total) by mouth daily., Disp: 30 tablet, Rfl: 1   montelukast  (SINGULAIR ) 10 MG tablet, Take 1 tablet by mouth once daily with breakfast, Disp: 90 tablet, Rfl: 1   nitroGLYCERIN  (NITROSTAT ) 0.4 MG SL tablet, Place 1 tablet (0.4 mg total) under the tongue every 5 (five) minutes as needed for chest pain., Disp: 10 tablet, Rfl: 3   oxybutynin  (DITROPAN -XL) 5 MG 24 hr tablet, Take 1 tablet (5 mg total) by mouth at bedtime., Disp: 90 tablet, Rfl: 1   polyethylene glycol (MIRALAX  / GLYCOLAX ) 17 g packet, Take 17 g by mouth daily as needed., Disp: , Rfl:    potassium chloride  SA (KLOR-CON  M) 20 MEQ tablet,  Take 1 tablet (20 mEq total) by mouth daily., Disp: 30 tablet, Rfl: 1   RELION PEN NEEDLES 29G X MISC, USE 1 NEEDLE TO INJECT INSULIN  SUBCUTANEOUSLY 3 TIMES DAILY, Disp: 50 each, Rfl: 5   rosuvastatin  (CRESTOR ) 40 MG tablet, Take 1 tablet by mouth once daily, Disp: 90 tablet, Rfl: 0   Semaglutide , 2 MG/DOSE, (OZEMPIC , 2 MG/DOSE,) 8 MG/3ML SOPN, Inject 2 mg into the skin once a week., Disp: 3 mL, Rfl: 0   spironolactone  (ALDACTONE ) 25 MG tablet, Take 1 tablet (25 mg total) by mouth daily., Disp: 30 tablet, Rfl: 1  Past Medical History: Past Medical History:  Diagnosis Date   CAD (coronary artery disease)    a. 04/2012 NSTEMI: s/p DES to LAD.   Chronic diastolic CHF (congestive heart failure) (HCC)    Diabetes mellitus without complication (HCC)    Dysrhythmia    Environmental and seasonal allergies    Hyperlipidemia    Hypertension    Leukocytosis    Morbid obesity (HCC)    NSTEMI (non-ST elevated myocardial infarction) (HCC) 04/27/2012   PONV (postoperative nausea and vomiting)    PVD (peripheral vascular disease) (HCC)    a. 04/2012 ABI: R 0.49, L 0.39. Angiography 06/14: Significant ostial left common iliac artery stenosis extending into the distal aorta, severe left common femoral artery stenosis, bilateral SFA occlusion with heavy calcifications. Functionally, one-vessel runoff bilaterally below the knee   Renal artery stenosis (HCC)    s/p angioplasty/stenting bilateral renal arteries 03/16/19   Shortness of breath     Tobacco Use: Social History   Tobacco Use  Smoking Status Former   Current packs/day: 0.00   Average packs/day: 1 pack/day for 36.8 years (36.8 ttl pk-yrs)   Types: Cigarettes   Start date: 04/21/1964   Quit date: 02/17/2001   Years since quitting: 22.5  Smokeless Tobacco Never    Labs: Review Flowsheet  More data exists      Latest Ref Rng & Units 04/08/2023 05/11/2023 06/05/2023 06/06/2023 08/11/2023  Labs for ITP Cardiac and Pulmonary Rehab  Cholestrol  100 - 199 mg/dL 871  - - - 858   LDL (calc) 0 - 99 mg/dL 49  - - - 61   HDL-C >60 mg/dL 52  - - - 62   Trlycerides 0 - 149 mg/dL 865  - - - 898   Hemoglobin A1c 4.8 - 5.6 % - 7.1  - 7.4  7.8   Bicarbonate 20.0 - 28.0 mmol/L - - 37.6  - -  O2 Saturation % - - 39.8  - -    Capillary Blood Glucose: Lab Results  Component Value Date   GLUCAP 296 (H) 08/19/2023   GLUCAP 207 (H) 08/19/2023   GLUCAP 243 (H) 08/18/2023   GLUCAP 201 (H) 08/18/2023  GLUCAP 309 (H) 08/18/2023     Exercise Target Goals: Exercise Program Goal: Individual exercise prescription set with THRR, safety & activity barriers. Participant demonstrates ability to understand and report RPE using RPE 6-20 scale, to self-measure pulse accurately, and to acknowledge the importance of the exercise prescription.  Exercise Prescription Goal: Use initial exercise assessment to set exercise prescription to improve time and distance to onset of claudication pain. Provide education to aid in steps toward risk factor modification, improve quality of life with claudication, and utilize THRR and exercise prescription for best results for decreasing symptoms of PAD. Prevent injury to bone, joints, and skin integrity during the exercise through checks of each system during session check in. Following physician guidelines for any contraindications to specific exercises.  Activity Barriers:  Activity Barriers & Cardiac Risk Stratification - 05/25/23 1413       Activity Barriers & Cardiac Risk Stratification   Activity Barriers Deconditioning;Muscular Weakness;Assistive Device;Balance Concerns;Shortness of Breath          [facility]ner Treadmill Test Assessment:  Treadmill Test     Row Name 05/25/23 1604       Treadmill Test   Phase Pre    Speed if <2.0 mph 1.7 mph    Time to Claudication Onset 1.68 minutes    Total Time to 3-4/5 Claudication Pain 4.48 minutes      Heart Rate   Rest 49 bpm    0% Grade 61 bpm    2% Grade 71 bpm     4% Grade 73 bpm    2 Min Post 60 bpm      Blood Pressure   Rest 112/62    4% Grade 138/72    2 Min Post 126/74      Claudication Score   Rest 1    0% 2    2% Grade 3    4% Grade 4    2 Min Post 4      RPE   0% 13    2% Grade 15    4% Grade 17      Time in Stage   0% 2 minutes    2% Grade 2 minutes    4% Grade 0.48 minutes       Walking Impairment Questionnaire:  Walking Impairment Questionnaire - 05/25/23 1607       A. PAD Specific Questions   Leg Both    Pre Score 0    Pre % Score 0 %      B. Differential Diagnosis   Pre Score 11      Distance   Pre Distance Total Score 190    Pre Distance % Score 1.35 %      Speed   Pre Speed Total Score 8.5    Pre Speed % Score 18.48 %      Stairs   Pre Stairs Total Score 36    Pre Stairs % Score 12.5 %      Distance, Speed, and Stairs   Pre Distance, Speed, and Stairs Total Score 234.5    Pre Distance, Speed, and Stairs % Score Mean 10.78 %          Expectation is that scores will be within the SD range. Distance Pre % Score Mean: 38% (SD 12% - 0.64%)   Post % Score Mean: 55% (SD 26% - 84%)   Mean % Change: 18% (SD (-10%) - (46%))  Speed Pre % Score Mean: 41% (SD 19% - 63%)   Post %  Score Mean: 52% (SD 30% - 74%)   Mean % Change: 11% (SD (-9%) - (31%))  Stairs Pre % Score Mean: 55% (SD 23% - 87%)   Post % Score Mean: 68% (SD 39% - 97%)   Mean % Change: 14% (SD (-15%) - (43%))  Total Score Pre % Score Mean: 45% (SD 23% - 67%   Post % Score Mean: 58% (SD 36% - 80%)   Mean % Change: 14% (SD (-5%) - (43%))   Oxygen Initial Assessment:   Oxygen Re-Evaluation:   Oxygen Discharge (Final Oxygen Re-Evaluation):   Initial Exercise Prescription:  Initial Exercise Prescription - 05/25/23 1600       Date of Initial Exercise RX and Referring Provider   Date 05/25/23    Referring Provider Darron Grass MD    Expected Discharge Date 08/14/23      Oxygen   Maintain Oxygen Saturation 88% or higher       Treadmill   MPH 1.7    Grade 0    Minutes 10    METs 2.3      Prescription Details   Frequency (times per week) 3    Duration Progress to 10 minutes continuous walking  at current work load and total walking time to 30-45 min      Intensity   THRR 40-80% of Max Heartrate 89-128    Ratings of Perceived Exertion 11-13    Perceived Dyspnea 0-4      Progression   Progression Continue to follow PAD protocol      Resistance Training   Training Prescription Yes    Weight 3 lb    Reps 10-15          Perform Capillary Blood Glucose checks as needed.  Exercise Prescription Changes:   Exercise Prescription Changes     Row Name 05/25/23 1600 08/10/23 1500           Response to Exercise   Blood Pressure (Admit) 112/62 152/58      Blood Pressure (Exercise) 138/72 --      Blood Pressure (Exit) 126/74 148/60      Heart Rate (Admit) 49 bpm 56 bpm      Heart Rate (Exercise) 73 bpm 69 bpm      Heart Rate (Exit) 60 bpm 54 bpm      Oxygen Saturation (Admit) 94 % 92 %      Oxygen Saturation (Exercise) 96 % 92 %      Oxygen Saturation (Exit) 96 % 96 %      Rating of Perceived Exertion (Exercise) 17 12      Perceived Dyspnea (Exercise) 1 --      Symptoms CPS 4, slightly SOB CPS 3      Comments treadmill test PAD      Duration -- Continue with 30 min of aerobic exercise without signs/symptoms of physical distress.      Intensity -- THRR unchanged        Progression   Progression -- Continue to progress workloads to maintain intensity without signs/symptoms of physical distress.        Resistance Training   Training Prescription -- Yes      Weight -- 3      Reps -- 10-15        Treadmill   MPH -- 1.6      Grade -- 0      Minutes -- 20  6 min rest break      METs -- 2.23  Exercise Comments:   Exercise Goals and Review:   Exercise Goals     Row Name 05/25/23 1613             Exercise Goals   Increase Physical Activity Yes       Intervention  Provide advice, education, support and counseling about physical activity/exercise needs.;Develop an individualized exercise prescription for aerobic and resistive training based on initial evaluation findings, risk stratification, comorbidities and participant's personal goals.       Expected Outcomes Long Term: Exercising regularly at least 3-5 days a week.;Short Term: Attend rehab on a regular basis to increase amount of physical activity.;Long Term: Add in home exercise to make exercise part of routine and to increase amount of physical activity.       Increase Strength and Stamina Yes       Intervention Provide advice, education, support and counseling about physical activity/exercise needs.;Develop an individualized exercise prescription for aerobic and resistive training based on initial evaluation findings, risk stratification, comorbidities and participant's personal goals.       Expected Outcomes Short Term: Increase workloads from initial exercise prescription for resistance, speed, and METs.;Short Term: Perform resistance training exercises routinely during rehab and add in resistance training at home;Long Term: Improve cardiorespiratory fitness, muscular endurance and strength as measured by increased METs and functional capacity ( )       Able to understand and use rate of perceived exertion (RPE) scale Yes       Intervention Provide education and explanation on how to use RPE scale       Expected Outcomes Short Term: Able to use RPE daily in rehab to express subjective intensity level;Long Term:  Able to use RPE to guide intensity level when exercising independently       Able to understand and use Dyspnea scale Yes       Intervention Provide education and explanation on how to use Dyspnea scale       Expected Outcomes Long Term: Able to use Dyspnea scale to guide intensity level when exercising independently;Short Term: Able to use Dyspnea scale daily in rehab to express subjective sense  of shortness of breath during exertion       Knowledge and understanding of Target Heart Rate Range (THRR) Yes       Intervention Provide education and explanation of THRR including how the numbers were predicted and where they are located for reference       Expected Outcomes Short Term: Able to state/look up THRR;Long Term: Able to use THRR to govern intensity when exercising independently;Short Term: Able to use daily as guideline for intensity in rehab       Able to check pulse independently Yes       Intervention Provide education and demonstration on how to check pulse in carotid and radial arteries.;Review the importance of being able to check your own pulse for safety during independent exercise       Expected Outcomes Short Term: Able to explain why pulse checking is important during independent exercise;Long Term: Able to check pulse independently and accurately       Understanding of Exercise Prescription Yes       Intervention Provide education, explanation, and written materials on patient's individual exercise prescription       Expected Outcomes Short Term: Able to explain program exercise prescription;Long Term: Able to explain home exercise prescription to exercise independently       Improve claudication pain toleration; Improve walking ability Yes  Intervention Participate in PAD/SET Rehab 2-3 days a week, walking at home as part of exercise prescription;Attend education sessions to aid in risk factor modification and understanding of disease process       Expected Outcomes Short Term: Improve walking distance/time to onset of claudication pain;Long Term: Improve walking ability and toleration to claudication;Long Term: Improve score of PAD questionnaires          Exercise Goals Re-Evaluation :  Exercise Goals Re-Evaluation     Row Name 07/06/23 1528 08/04/23 0749 08/11/23 0748         Exercise Goal Re-Evaluation   Exercise Goals Review Increase Physical  Activity;Increase Strength and Stamina;Able to understand and use Dyspnea scale;Improve claudication pain tolerance and improve walking ability;Able to understand and use rate of perceived exertion (RPE) scale;Knowledge and understanding of Target Heart Rate Range (THRR);Able to check pulse independently;Understanding of Exercise Prescription Increase Physical Activity;Increase Strength and Stamina Increase Physical Activity;Increase Strength and Stamina;Improve claudication pain tolerance and improve walking ability     Comments Harveen states that she goes to different therapies for her PAD and diabetes. She does walking on the treadmill and water  aerobics. Katera states she is always busy at home doing house chores which keeps her active. She is continuing with her therapies outside of the program also to help her PAD. Lately during rehab her CPS scale has been a 2-3 which is an improvement from a 4 in the beginning. Aracelie states she is always busy at home whether it is doing house chores or running errands all the time.  She plans to stay active by walking when she gradutes the program.     Expected Outcomes Short: Continue to attend rehab. Long: Continue therapies outside of the program as well. Short: Continue to attend rehab. Long: Continue therapies outside of the program as well and work on keeping her CPS score low. Short: Continue to attend rehab. Long: Continue therapies outside of the program as well and work on keeping her CPS score low.         Discharge Exercise Prescription (Final Exercise Prescription Changes):  Exercise Prescription Changes - 08/10/23 1500       Response to Exercise   Blood Pressure (Admit) 152/58    Blood Pressure (Exit) 148/60    Heart Rate (Admit) 56 bpm    Heart Rate (Exercise) 69 bpm    Heart Rate (Exit) 54 bpm    Oxygen Saturation (Admit) 92 %    Oxygen Saturation (Exercise) 92 %    Oxygen Saturation (Exit) 96 %    Rating of Perceived Exertion (Exercise)  12    Symptoms CPS 3    Comments PAD    Duration Continue with 30 min of aerobic exercise without signs/symptoms of physical distress.    Intensity THRR unchanged      Progression   Progression Continue to progress workloads to maintain intensity without signs/symptoms of physical distress.      Resistance Training   Training Prescription Yes    Weight 3    Reps 10-15      Treadmill   MPH 1.6    Grade 0    Minutes 20   6 min rest break   METs 2.23          Nutrition:  Target Goals: Understanding of nutrition guidelines, daily intake of sodium 1500mg , cholesterol 200mg , calories 30% from fat and 7% or less from saturated fats, daily to have 5 or more servings of fruits and vegetables.  Biometrics:  Pre Biometrics - 05/25/23 1614       Pre Biometrics   Height 5' 1.25 (1.556 m)    Weight 206 lb (93.4 kg)    Waist Circumference 40 inches    Hip Circumference 46.25 inches    Waist to Hip Ratio 0.86 %    BMI (Calculated) 38.59    Grip Strength 33 kg    Single Leg Stand 3.1 seconds           Nutrition Therapy Plan and Nutrition Goals:  Nutrition Therapy & Goals - 05/25/23 1615       Intervention Plan   Intervention Prescribe, educate and counsel regarding individualized specific dietary modifications aiming towards targeted core components such as weight, hypertension, lipid management, diabetes, heart failure and other comorbidities.;Nutrition handout(s) given to patient.    Expected Outcomes Short Term Goal: Understand basic principles of dietary content, such as calories, fat, sodium, cholesterol and nutrients.;Long Term Goal: Adherence to prescribed nutrition plan.          Nutrition Discharge: Rate Your Plate Scores:   Nutrition Goals Re-Evaluation:  Nutrition Goals Re-Evaluation     Row Name 07/06/23 1519 08/04/23 0744 08/11/23 0746         Goals   Nutrition Goal Heart healthy and carb modified. Heart healthy and carb modified. Heart healthy and  carb modified.     Comment Kynzi states that she eats a card modified and heart healthy diet and follows it pretty well for her diabetes. Bellamie is very consistent with her diet. She struggles with diabetes so she knows what to eat to help control her blood sugars. Darlin is very knowledgable about what to eat as she struggles with diabetes. She knows when her sugar is dropping and always carries snacks around with her in case of an incidence where it drops.     Expected Outcome Short: Continue to follow heart healthy, carb modified diet. Long: Continue checking blood sugars and report to us  and other medical facilities. Short: Continue to follow heart healthy, carb modified diet. Long: Continue checking blood sugars and report to us  and other medical facilities. Short: Continue to follow heart healthy, carb modified diet. Long: Continue checking blood sugars and report to us  and other medical facilities.        Nutrition Goals Discharge (Final Nutrition Goals Re-Evaluation):  Nutrition Goals Re-Evaluation - 08/11/23 0746       Goals   Nutrition Goal Heart healthy and carb modified.    Comment Erilyn is very knowledgable about what to eat as she struggles with diabetes. She knows when her sugar is dropping and always carries snacks around with her in case of an incidence where it drops.    Expected Outcome Short: Continue to follow heart healthy, carb modified diet. Long: Continue checking blood sugars and report to us  and other medical facilities.          Psychosocial: Target Goals: Acknowledge presence or absence of significant depression and/or stress, maximize coping skills, provide positive support system. Participant is able to verbalize types and ability to use techniques and skills needed for reducing stress and depression.  Initial Review & Psychosocial Screening:  Initial Psych Review & Screening - 05/18/23 1027       Initial Review   Current issues with None Identified       Family Dynamics   Good Support System? Yes      Barriers   Psychosocial barriers to participate in program There are no identifiable  barriers or psychosocial needs.      Screening Interventions   Interventions Encouraged to exercise;Provide feedback about the scores to participant;To provide support and resources with identified psychosocial needs    Expected Outcomes Short Term goal: Utilizing psychosocial counselor, staff and physician to assist with identification of specific Stressors or current issues interfering with healing process. Setting desired goal for each stressor or current issue identified.;Long Term Goal: Stressors or current issues are controlled or eliminated.;Short Term goal: Identification and review with participant of any Quality of Life or Depression concerns found by scoring the questionnaire.;Long Term goal: The participant improves quality of Life and PHQ9 Scores as seen by post scores and/or verbalization of changes          Quality of Life Scores:  Quality of Life - 05/25/23 1614       Quality of Life   Select PAD/SET Quality of Life      PAD/SET Quality of Life Scores   Social Relationships and Interactions Pre 31    Self-Concepts and Feelings Pre 26    Symptoms and Limitations Pre 14    Fear and Uncertainty Pre 7    Positive Adaptation Pre 31    Job Pre 5    Sex Pre 5    Intimate Relationships Pre 5          Scores should be at or above lower SD. Change of 5 points or better indicates improvement in first 5 Factors. Maximum score for last three Factors is 6.  Score Interpretation Lower SD  Social Relationships and Interactions 23.99  Self-Concept and Feelings 17.79  Symptoms and Limitations 11.75  Fear and Uncertainty 8.96  Positive Adaptation 22.29  Job 2.08  Sexual Function 1.43  Intimate Mean 2.08   PHQ-9: Review Flowsheet  More data exists      08/11/2023 06/15/2023 06/02/2023 05/25/2023 05/11/2023  Depression screen PHQ 2/9  Decreased  Interest 0 0 0 0 0  Down, Depressed, Hopeless 0 0 0 0 0  PHQ - 2 Score 0 0 0 0 0  Altered sleeping 0 - - 0 -  Tired, decreased energy 0 - - 0 -  Change in appetite 0 - - 0 -  Feeling bad or failure about yourself  0 - - 0 -  Trouble concentrating 0 - - 0 -  Moving slowly or fidgety/restless 0 - - 0 -  Suicidal thoughts 0 - - 0 -  PHQ-9 Score 0 - - 0 -  Difficult doing work/chores Not difficult at all - - Not difficult at all -   Interpretation of Total Score  Total Score Depression Severity:  1-4 = Minimal depression, 5-9 = Mild depression, 10-14 = Moderate depression, 15-19 = Moderately severe depression, 20-27 = Severe depression   Psychosocial Evaluation and Intervention:  Psychosocial Evaluation - 05/18/23 1027       Psychosocial Evaluation & Interventions   Interventions Stress management education;Relaxation education;Encouraged to exercise with the program and follow exercise prescription    Comments Patient was referred to the PAD program. She is originally from [place] but has been in the [place]  for [place]+ years. She denies any depression, anxiety or stress and says she sleeps well. She has great support from her 4 children. She has a history of CAD with mutiple stents and CABG in 2022. She enjoys working in her flower garden but is not able to do a lot due to bilateral leg pain from both peripheral neuropathy and PAD. She has a treadmill  but is not able to use it. She says Dr. Darron highly recommendends this program and she is hoping to improve the pain in her legs; improve her mobility overall; and to be able to do the things she enjoys doing. She has no barriers to paritcipate in the program.    Expected Outcomes Short Term: Patient will start the program and attend consistently. Long Term: Patient will complete the program meeting personal goals.    Continue Psychosocial Services  Follow up required by staff          Psychosocial Re-Evaluation:  Psychosocial Re-Evaluation      Row Name 07/06/23 1518 08/04/23 0741 08/11/23 0743         Psychosocial Re-Evaluation   Current issues with None Identified None Identified None Identified     Comments Anagabriela states that she doesn't let anything stress her out, she always finds something to do around the house and goes for a ride if she is ever feeling bored. Kalii has still remained stress free since last time doing her goals. She states she doesn't let anything stress her out. She always can find something to do whether it is around the house or going out and about. She has been very pleased coming to rehab and says her doctor is pleased with her results as well. Axie is getting close to finishing the program. She is very pleased with her results so far within the program as she says her legs do not hurt as bad as when she firdst started. She continues to not have any stress complaints.     Expected Outcomes Short: Continue to attend rehab. Long: Continue to follow stress reduction techniques. Short: Continue to attend rehab. Long: Continue to follow stress reduction techniques. Short: Finish up her last week of rehab. Long: Continue to follow her stress reduction techniques.     Interventions Encouraged to attend Cardiac Rehabilitation for the exercise;Encouraged to attend Pulmonary Rehabilitation for the exercise Encouraged to attend Pulmonary Rehabilitation for the exercise;Encouraged to attend Cardiac Rehabilitation for the exercise Encouraged to attend Pulmonary Rehabilitation for the exercise;Encouraged to attend Cardiac Rehabilitation for the exercise     Continue Psychosocial Services  Follow up required by staff Follow up required by staff Follow up required by staff        Psychosocial Discharge (Final Psychosocial Re-Evaluation):  Psychosocial Re-Evaluation - 08/11/23 0743       Psychosocial Re-Evaluation   Current issues with None Identified    Comments Trynity is getting close to finishing the program. She is  very pleased with her results so far within the program as she says her legs do not hurt as bad as when she firdst started. She continues to not have any stress complaints.    Expected Outcomes Short: Finish up her last week of rehab. Long: Continue to follow her stress reduction techniques.    Interventions Encouraged to attend Pulmonary Rehabilitation for the exercise;Encouraged to attend Cardiac Rehabilitation for the exercise    Continue Psychosocial Services  Follow up required by staff          Vocational Rehabilitation: Provide vocational rehab assistance to qualifying candidates.   Vocational Rehab Evaluation & Intervention:  Vocational Rehab - 05/18/23 1026       Initial Vocational Rehab Evaluation & Intervention   Assessment shows need for Vocational Rehabilitation No      Vocational Rehab Re-Evaulation   Comments Retired.          Education: Education Goals: Education  classes will be provided on a variety of topics geared toward better understanding of heart and vascular health and risk factor modification. Participant will state understanding/return demonstration of topics presented as noted by education test scores.  Learning Barriers/Preferences:  Learning Barriers/Preferences - 05/18/23 1026       Learning Barriers/Preferences   Learning Barriers Reading   Patient states she is not able to ready English but her children help her with documentation.   Learning Preferences Audio;Skilled Demonstration;Pictoral          Education Topics: Hypertension, Hypertension Reduction -Define heart disease and high blood pressure. Discus how high blood pressure affects the body and ways to reduce high blood pressure. Flowsheet Row Supervised Exercise Therapy from 08/12/2023 in [place] [facility]  Date 07/08/23  Educator [facility]  Instruction Review Code 1- Verbalizes Understanding    Exercise and Your Heart -Discuss why it is important to exercise, the FITT  principles of exercise, normal and abnormal responses to exercise, and how to exercise safely. Flowsheet Row Supervised Exercise Therapy from 08/12/2023 in [place] [facility]  Date 06/03/23  Educator [facility]  Instruction Review Code 1- Verbalizes Understanding    Angina -Discuss definition of angina, causes of angina, treatment of angina, and how to decrease risk of having angina. Flowsheet Row Supervised Exercise Therapy from 08/12/2023 in [place] [facility]  Date 05/27/23  [tests/procedures]  Educator HB  Instruction Review Code 1- Verbalizes Understanding    Cardiac Medications -Review what the following cardiac medications are used for, how they affect the body, and side effects that may occur when taking the medications.  Medications include Aspirin , Beta blockers, calcium  channel blockers, ACE Inhibitors, angiotensin receptor blockers, diuretics, digoxin, and antihyperlipidemics. Flowsheet Row Supervised Exercise Therapy from 08/12/2023 in [place] [facility]  Date 07/15/23  Educator BS  Instruction Review Code 1- Verbalizes Understanding    Congestive Heart Failure -Discuss the definition of CHF, how to live with CHF, the signs and symptoms of CHF, and how keep track of weight and sodium intake.   Heart Disease and Intimacy -Discus the effect sexual activity has on the heart, how changes occur during intimacy as we age, and safety during sexual activity.   Smoking Cessation / COPD -Discuss different methods to quit smoking, the health benefits of quitting smoking, and the definition of COPD.   Nutrition I: Fats -Discuss the types of cholesterol, what cholesterol does to the heart, and how cholesterol levels can be controlled. Flowsheet Row Supervised Exercise Therapy from 08/12/2023 in [place] [facility]  Date 06/24/23  Educator HB  Instruction Review Code 1- Verbalizes Understanding    Nutrition II: Labels -Discuss  the different components of food labels and how to read food label   Heart Parts/Heart Disease & PAD -Discuss the anatomy of the heart, the pathway of blood circulation through the heart, and these are affected by heart disease.   Stress I: Signs and Symptoms -Discuss the causes of stress, how stress may lead to anxiety and depression, and ways to limit stress. Flowsheet Row Supervised Exercise Therapy from 08/12/2023 in [place] [facility]  Date 07/01/23  Educator HB  Instruction Review Code 1- Verbalizes Understanding    Stress II: Relaxation -Discuss different types of relaxation techniques to limit stress. Flowsheet Row Supervised Exercise Therapy from 08/12/2023 in [place] [facility]  Date 06/17/23  Educator [facility]  Instruction Review Code 1- Verbalizes Understanding    Warning Signs of Stroke / TIA -Discuss definition of  a stroke, what the signs and symptoms are of a stroke, and how to identify when someone is having stroke.   Knowledge Questionnaire Score:  Knowledge Questionnaire Score - 05/25/23 1619       Knowledge Questionnaire Score   Pre Score 14/24          Core Components/Risk Factors/Patient Goals at Admission:  Personal Goals and Risk Factors at Admission - 05/25/23 1619       Core Components/Risk Factors/Patient Goals on Admission    Weight Management Obesity;Yes;Weight Loss    Intervention Weight Management: Develop a combined nutrition and exercise program designed to reach desired caloric intake, while maintaining appropriate intake of nutrient and fiber, sodium and fats, and appropriate energy expenditure required for the weight goal.;Weight Management: Provide education and appropriate resources to help participant work on and attain dietary goals.;Weight Management/Obesity: Establish reasonable short term and long term weight goals.;Obesity: Provide education and appropriate resources to help participant work on and attain  dietary goals.    Admit Weight 206 lb (93.4 kg)    Goal Weight: Short Term 201 lb (91.2 kg)    Goal Weight: Long Term 196 lb (88.9 kg)    Expected Outcomes Short Term: Continue to assess and modify interventions until short term weight is achieved;Long Term: Adherence to nutrition and physical activity/exercise program aimed toward attainment of established weight goal;Weight Loss: Understanding of general recommendations for a balanced deficit meal plan, which promotes 1-2 lb weight loss per week and includes a negative energy balance of [phone] kcal/d;Understanding recommendations for meals to include 15-35% energy as protein, 25-35% energy from fat, 35-60% energy from carbohydrates, less than 200mg  of dietary cholesterol, 20-35 gm of total fiber daily;Understanding of distribution of calorie intake throughout the day with the consumption of 4-5 meals/snacks    Improve shortness of breath with ADL's Yes    Intervention Provide education, individualized exercise plan and daily activity instruction to help decrease symptoms of SOB with activities of daily living.    Expected Outcomes Short Term: Improve cardiorespiratory fitness to achieve a reduction of symptoms when performing ADLs;Long Term: Be able to perform more ADLs without symptoms or delay the onset of symptoms    Diabetes Yes    Intervention Provide education about signs/symptoms and action to take for hypo/hyperglycemia.;Provide education about proper nutrition, including hydration, and aerobic/resistive exercise prescription along with prescribed medications to achieve blood glucose in normal ranges: Fasting glucose 65-99 mg/dL    Expected Outcomes Short Term: Participant verbalizes understanding of the signs/symptoms and immediate care of hyper/hypoglycemia, proper foot care and importance of medication, aerobic/resistive exercise and nutrition plan for blood glucose control.;Long Term: Attainment of HbA1C < 7%.    Heart Failure Yes     Intervention Provide a combined exercise and nutrition program that is supplemented with education, support and counseling about heart failure. Directed toward relieving symptoms such as shortness of breath, decreased exercise tolerance, and extremity edema.    Expected Outcomes Improve functional capacity of life;Short term: Attendance in program 2-3 days a week with increased exercise capacity. Reported lower sodium intake. Reported increased fruit and vegetable intake. Reports medication compliance.;Short term: Daily weights obtained and reported for increase. Utilizing diuretic protocols set by physician.;Long term: Adoption of self-care skills and reduction of barriers for early signs and symptoms recognition and intervention leading to self-care maintenance.    Hypertension Yes    Intervention Provide education on lifestyle modifcations including regular physical activity/exercise, weight management, moderate sodium restriction and increased consumption of fresh  fruit, vegetables, and low fat dairy, alcohol  moderation, and smoking cessation.;Monitor prescription use compliance.    Expected Outcomes Short Term: Continued assessment and intervention until BP is < 140/24mm HG in hypertensive participants. < 130/4mm HG in hypertensive participants with diabetes, heart failure or chronic kidney disease.;Long Term: Maintenance of blood pressure at goal levels.    Lipids Yes    Intervention Provide education and support for participant on nutrition & aerobic/resistive exercise along with prescribed medications to achieve LDL 70mg , HDL >40mg .    Expected Outcomes Short Term: Participant states understanding of desired cholesterol values and is compliant with medications prescribed. Participant is following exercise prescription and nutrition guidelines.;Long Term: Cholesterol controlled with medications as prescribed, with individualized exercise RX and with personalized nutrition plan. Value goals: LDL <  70mg , HDL > 40 mg.          Core Components/Risk Factors/Patient Goals Review:   Goals and Risk Factor Review     Row Name 07/06/23 1526 08/04/23 0747 08/11/23 0747         Core Components/Risk Factors/Patient Goals Review   Personal Goals Review Weight Management/Obesity;Diabetes;Hypertension;Heart Failure Weight Management/Obesity;Diabetes;Hypertension;Heart Failure Weight Management/Obesity;Diabetes;Hypertension;Heart Failure     Review Kristiane states that she takes all of her medication as prescribed. She checks her BP regularly and it runs about even with ours. She also checks her blood sugar about 5 times a day. Jessabelle is very adamant about checking her blood sugars often because her blood sugar can flucuate so easily. She always carries snacks with her in case her sugar drops. She checks her BP at home and states it runs similar to what we get in rehab. Gayleen states that Dr. Darron is very pleased with her results here from rehab. Jalila checks her blood sugars very often at home and keeps a close eye on her blood sugar. She checks her blood pressure at home on the regular as well. She states her doctor is very pleased with her results of the program so far. She will be finishing up the program soon.     Expected Outcomes Short: Continue to attend rehab. Long: Continue to check BP and blood sugars at home. Short: Continue to attend rehab. Long: Continue to check BP and blood sugars at home. Short: Continue to attend rehab. Long: Continue to check BP and blood sugars at home.        Core Components/Risk Factors/Patient Goals at Discharge (Final Review):   Goals and Risk Factor Review - 08/11/23 0747       Core Components/Risk Factors/Patient Goals Review   Personal Goals Review Weight Management/Obesity;Diabetes;Hypertension;Heart Failure    Review Lanyah checks her blood sugars very often at home and keeps a close eye on her blood sugar. She checks her blood pressure at home on the  regular as well. She states her doctor is very pleased with her results of the program so far. She will be finishing up the program soon.    Expected Outcomes Short: Continue to attend rehab. Long: Continue to check BP and blood sugars at home.          ITP Comments:  ITP Comments     Row Name 05/18/23 1034 05/25/23 1601 06/10/23 1338 07/08/23 1015 08/05/23 1007   ITP Comments Virtual orientation visit completed for the PAD program. On-site orientation visit scheduled for 05/20/23 at 1300. Documentation mailed and patient says her daughter helped her complete. Patient attend orientation today.  Patient is attending Supervised Exercise Therapy for PAD.  Documentation  for diagnosis can be found in [facility] 04/28/23.  Reviewed medical chart, RPE/RPD, gym safety, and program guidelines.  Patient was fitted to equipment they will be using during rehab.  Patient is scheduled to start exercise on Wednesday 05/27/23 at 1430.   Initial ITP created and sent for review and signature by Dr. Deatrice Cage, referring physician. 30 day review completed. Plan of Care Update sent to Dr. Cage, referring physician. Continue with Plan of Care unless changes are made by physician. Currently awaiting clearance to return to rehab. 30 day review completed. Plan of Care Update sent to Dr. Cage, referring physician. Continue with Plan of Care unless changes are made by physician. 30 day review completed. Plan of Care Update sent to Dr. Cage, referring physician. Continue with Plan of Care unless changes are made by physician.    Row Name 08/20/23 0926           ITP Comments Unfortunately, Sherida was in the hospital when her time for PAD SET program ran out.  She did have a referral for cardiac but wants to just walk at home on her own.  She did well in rehab.  We will discharge her at this time. She completed 23/36 sessions.          Comments: Discharge Plan of Care

## 2023-08-20 NOTE — Progress Notes (Signed)
 Discharge Progress Report  Patient Details  Name: Leah Ferrell MRN: [id number] Date of Birth: January 23, 1951 Referring Provider:   Conrad Row Supervised Exercise Therapy from 05/25/2023 in [facility]  Referring Provider Darron Grass MD     Number of Visits: 23  Reason for Discharge:  Patient has met program and personal goals. Early Exit:  End of time  Smoking History:  Social History   Tobacco Use  Smoking Status Former   Current packs/day: 0.00   Average packs/day: 1 pack/day for 36.8 years (36.8 ttl pk-yrs)   Types: Cigarettes   Start date: 04/21/1964   Quit date: 02/17/2001   Years since quitting: 22.5  Smokeless Tobacco Never    Diagnosis:  PAD (peripheral artery disease) (HCC)    Initial Exercise Prescription:  Initial Exercise Prescription - 05/25/23 1600       Date of Initial Exercise RX and Referring Provider   Date 05/25/23    Referring Provider Darron Grass MD    Expected Discharge Date 08/14/23      Oxygen   Maintain Oxygen Saturation 88% or higher      Treadmill   MPH 1.7    Grade 0    Minutes 10    METs 2.3      Prescription Details   Frequency (times per week) 3    Duration Progress to 10 minutes continuous walking  at current work load and total walking time to 30-45 min      Intensity   THRR 40-80% of Max Heartrate 89-128    Ratings of Perceived Exertion 11-13    Perceived Dyspnea 0-4      Progression   Progression Continue to follow PAD protocol      Resistance Training   Training Prescription Yes    Weight 3 lb    Reps 10-15          Discharge Exercise Prescription (Final Exercise Prescription Changes):  Exercise Prescription Changes - 08/10/23 1500       Response to Exercise   Blood Pressure (Admit) 152/58    Blood Pressure (Exit) 148/60    Heart Rate (Admit) 56 bpm    Heart Rate (Exercise) 69 bpm    Heart Rate (Exit) 54 bpm    Oxygen Saturation (Admit) 92 %    Oxygen Saturation  (Exercise) 92 %    Oxygen Saturation (Exit) 96 %    Rating of Perceived Exertion (Exercise) 12    Symptoms CPS 3    Comments PAD    Duration Continue with 30 min of aerobic exercise without signs/symptoms of physical distress.    Intensity THRR unchanged      Progression   Progression Continue to progress workloads to maintain intensity without signs/symptoms of physical distress.      Resistance Training   Training Prescription Yes    Weight 3    Reps 10-15      Treadmill   MPH 1.6    Grade 0    Minutes 20   6 min rest break   METs 2.23          Functional Capacity:   Treadmill Test     Row Name 05/25/23 1604       Treadmill Test   Phase Pre    Speed if <2.0 mph 1.7 mph    Time to Claudication Onset 1.68 minutes    Total Time to 3-4/5 Claudication Pain 4.48 minutes      Heart Rate   Rest 49  bpm    0% Grade 61 bpm    2% Grade 71 bpm    4% Grade 73 bpm    2 Min Post 60 bpm      Blood Pressure   Rest 112/62    4% Grade 138/72    2 Min Post 126/74      Claudication Score   Rest 1    0% 2    2% Grade 3    4% Grade 4    2 Min Post 4      RPE   0% 13    2% Grade 15    4% Grade 17      Time in Stage   0% 2 minutes    2% Grade 2 minutes    4% Grade 0.48 minutes        Psychological, QOL, Others - Outcomes: PHQ 2/9:    08/11/2023   11:23 AM 06/15/2023    1:45 PM 06/02/2023    3:02 PM 05/25/2023    4:20 PM 05/11/2023   10:59 AM  Depression screen PHQ 2/9  Decreased Interest 0 0 0 0 0  Down, Depressed, Hopeless 0 0 0 0 0  PHQ - 2 Score 0 0 0 0 0  Altered sleeping 0   0   Tired, decreased energy 0   0   Change in appetite 0   0   Feeling bad or failure about yourself  0   0   Trouble concentrating 0   0   Moving slowly or fidgety/restless 0   0   Suicidal thoughts 0   0   PHQ-9 Score 0   0   Difficult doing work/chores Not difficult at all   Not difficult at all        Nutrition & Weight - Outcomes:  Pre Biometrics - 05/25/23 1614        Pre Biometrics   Height 5' 1.25 (1.556 m)    Weight 206 lb (93.4 kg)    Waist Circumference 40 inches    Hip Circumference 46.25 inches    Waist to Hip Ratio 0.86 %    BMI (Calculated) 38.59    Grip Strength 33 kg    Single Leg Stand 3.1 seconds

## 2023-08-20 NOTE — Transitions of Care (Post Inpatient/ED Visit) (Signed)
 08/20/2023  Name: Leah Ferrell MRN: [id number] DOB: 26-Mar-1950  Today's TOC FU Call Status: Today's TOC FU Call Status:: Successful TOC FU Call Completed TOC FU Call Complete Date: 08/20/23 Patient's Name and Date of Birth confirmed.  Transition Care Management Follow-up Telephone Call Date of Discharge: 08/19/23 [facility] Type of Discharge: Inpatient Admission Primary Inpatient Discharge Diagnosis:: Acute respiratory failure with hypoxia How have you been since you were released from the hospital?:  (appetite good, no issues with bowel, bladder,  ambulating without difficulty, adult children assist) Any questions or concerns?: No  Items Reviewed: Did you receive and understand the discharge instructions provided?: Yes Medications obtained,verified, and reconciled?: Yes (Medications Reviewed) Any new allergies since your discharge?: No Dietary orders reviewed?: Yes Type of Diet Ordered:: low sodium   heart healthy Do you have support at home?: Yes People in Home [RPT]: alone Name of Support/Comfort Primary Source: adult children assist as needed  Medications Reviewed Today: Medications Reviewed Today     Reviewed by Aura Mliss LABOR, RN (Registered Nurse) on 08/20/23 at 1335  Med List Status: <None>   Medication Order Taking? Sig Documenting Provider Last Dose Status Informant  acetaminophen  (TYLENOL ) 325 MG tablet [id number] Yes Take 2 tablets (650 mg total) by mouth every 6 (six) hours as needed for mild pain (pain score 1-3) (or Fever >/= 101). Pearlean Manus, MD  Active Self, Pharmacy Records  albuterol  (VENTOLIN  HFA) 108 (90 Base) MCG/ACT inhaler [id number] Yes Inhale 2 puffs into the lungs every 6 (six) hours as needed for shortness of breath or wheezing. Pearlean Manus, MD  Active Self, Pharmacy Records  Ascorbic Acid  (VITAMIN C) 1000 MG tablet [id number] Yes Take 1,000 mg by mouth 3 (three) times daily.  [provider]  Active Self,  Pharmacy Records  aspirin  EC 81 MG tablet [id number] Yes Take 1 tablet (81 mg total) by mouth daily with breakfast. In the morning Pearlean Manus, MD  Active Self, Pharmacy Records  BD ULTRA-FINE PEN NEEDLES 29G X 12.7MM MISC [id number] Yes USE PENNEEDLES 3 TIMES  DAILY AS DIRECTED Hawks, Bari LABOR, FNP  Active Self, Pharmacy Records  bismuth subsalicylate (PEPTO BISMOL) 262 MG/15ML suspension [id number] Yes Take 30 mLs by mouth every 6 (six) hours as needed for indigestion or diarrhea or loose stools. [provider]  Active Self, Pharmacy Records  blood glucose meter kit and supplies [id number] Yes Dispense based on patient and insurance preference. Use up to four times daily as directed. (FOR ICD-10 E10.9, E11.9). Lavell Bari LABOR, FNP  Active Self, Pharmacy Records  calcium  carbonate (OSCAL) 1500 (600 Ca) MG TABS tablet [id number]  Take 600 mg of elemental calcium  by mouth 3 (three) times daily with meals.  Patient not taking: Reported on 08/20/2023   [provider]  Active Self, Pharmacy Records  Calcium  Carbonate-Vit D-Min (CALTRATE 600+D PLUS MINERALS) 600-800 MG-UNIT TABS [id number] Yes Take 1 tablet by mouth 3 (three) times daily.  [provider]  Active Self, Pharmacy Records  carvedilol  (COREG ) 25 MG tablet [id number] Yes Take 1 tablet (25 mg total) by mouth 2 (two) times daily with a meal. Pearlean, Courage, MD  Active Self, Pharmacy Records  Chlorpheniramine-Acetaminophen  (CORICIDIN HBP COLD/FLU PO) [id number] Yes Take 1 tablet by mouth daily as needed (cough/cold). [provider]  Active Self, Pharmacy Records  Cholecalciferol  (VITAMIN D -3) 125 MCG (5000 UT) TABS [id number] Yes Take 5,000 Units by mouth daily. [provider]  Active Self, Pharmacy Records  clopidogrel  (PLAVIX ) 75  MG tablet [id number] Yes Take 1 tablet (75 mg total) by mouth daily. Pearlean Manus, MD  Active Self, Pharmacy Records  fish oil-omega-3 fatty acids  1000 MG capsule [id number]  Yes Take 1 g by mouth daily.  [provider]  Active Self, Pharmacy Records  furosemide  (LASIX ) 40 MG tablet [id number] Yes Take 1 tablet (40 mg total) by mouth 2 (two) times daily. Ricky Fines, MD  Active   glucose blood (GLUCOSE METER TEST) test strip [id number] Yes Use 6-10 times a day, Uses ReliOn Lavell Bari LABOR, FNP  Active Self, Pharmacy Records  hydroxypropyl methylcellulose (ISOPTO TEARS) 2.5 % ophthalmic solution [id number] Yes Place 1 drop into both eyes 3 (three) times daily as needed (for dry eyes). [provider]  Active Self, Pharmacy Records  insulin  degludec (TRESIBA  FLEXTOUCH) 100 UNIT/ML FlexTouch Pen [id number] Yes Inject 20 Units into the skin at bedtime. Ricky Fines, MD  Active   insulin  lispro (HUMALOG  KWIKPEN) 200 UNIT/ML KwikPen [id number] Yes Inject 15-30 Units into the skin 3 (three) times daily before meals. Ricky Fines, MD  Active   Insulin  Syringe 27G X 1/2 0.5 ML MISC [id number] Yes Use with Fiasp  insulin  TID Lavell Bari LABOR, FNP  Active Self, Pharmacy Records  losartan  (COZAAR ) 50 MG tablet [id number] Yes Take 1 tablet (50 mg total) by mouth daily. Ricky Fines, MD  Active   montelukast  (SINGULAIR ) 10 MG tablet [id number] Yes Take 1 tablet by mouth once daily with breakfast Lavell Bari LABOR, FNP  Active Self, Pharmacy Records  nitroGLYCERIN  (NITROSTAT ) 0.4 MG SL tablet [id number] Yes Place 1 tablet (0.4 mg total) under the tongue every 5 (five) minutes as needed for chest pain. Arlice Reichert, MD  Active Self, Pharmacy Records  oxybutynin  (DITROPAN -XL) 5 MG 24 hr tablet [id number] Yes Take 1 tablet (5 mg total) by mouth at bedtime. Lavell Bari LABOR, FNP  Active Self, Pharmacy Records           Med Note DRENA CHUCK MATSU   Sat Aug 15, 2023  5:23 PM) Pt did state she has issues with her bladder but didn't clarify if she's had this recently  polyethylene glycol (MIRALAX  / GLYCOLAX ) 17 g packet [id number] Yes Take 17 g by mouth daily as needed. Ricky Fines, MD  Active   potassium chloride  SA (KLOR-CON  M) 20 MEQ tablet [id number] Yes Take 1 tablet (20 mEq total) by mouth daily. Ricky Fines, MD  Active   RELION PEN NEEDLES 29G X MISC [id number] Yes USE 1 NEEDLE TO INJECT INSULIN  SUBCUTANEOUSLY 3 TIMES DAILY Lavell Bari LABOR, FNP  Active Self, Pharmacy Records  rosuvastatin  (CRESTOR ) 40 MG tablet [id number] Yes Take 1 tablet by mouth once daily Lavell Bari LABOR, FNP  Active Self, Pharmacy Records  Semaglutide , 2 MG/DOSE, (OZEMPIC , 2 MG/DOSE,) 8 MG/3ML SOPN [id number] Yes Inject 2 mg into the skin once a week. Lavell Bari LABOR, FNP  Active Self, Pharmacy Records  spironolactone  (ALDACTONE ) 25 MG tablet [id number] Yes Take 1 tablet (25 mg total) by mouth daily. Ricky Fines, MD  Active             Home Care and Equipment/Supplies: Were Home Health Services Ordered?: No Any new equipment or medical supplies ordered?: No  Functional Questionnaire: Do you need assistance with bathing/showering or dressing?: No Do you need assistance with meal preparation?: No Do you need assistance with eating?: No Do you have difficulty maintaining continence: No Do you need assistance with getting out of bed/getting out of a chair/moving?: No  Do you have difficulty managing or taking your medications?: No  Follow up appointments reviewed: PCP Follow-up appointment confirmed?: Yes Date of PCP follow-up appointment?: 08/27/23 Follow-up Provider: Bari Learn FNP  @ 340 pm Specialist Hospital Follow-up appointment confirmed?: Yes Date of Specialist follow-up appointment?: 08/26/23 Follow-Up Specialty Provider:: Heart and Vascular  @ 1015 am Do you need transportation to your follow-up appointment?: No Do you understand care options if your condition(s) worsen?: Yes-patient verbalized understanding  SDOH Interventions Today    Flowsheet Row Most Recent Value  SDOH Interventions   Food Insecurity Interventions Intervention Not Indicated   Housing Interventions Intervention Not Indicated  Transportation Interventions Intervention Not Indicated  Utilities Interventions Intervention Not Indicated    Goals Addressed             This Visit's Progress    [facility] Transitions of Care (TOC) Care Plan       Problems:  Recent Hospitalization for treatment of CHF, respiratory failure Pt reports she lives alone, has good support from adult children, pt states she is independent, continues to drive, checks blood pressure daily 124/61 today, weighs daily 188 pounds today, checks CBG at least QID with fasting today 189  Goal:  Over the next 30 days, the patient will not experience hospital readmission  Interventions:   Heart Failure Interventions: Basic overview and discussion of pathophysiology of Heart Failure reviewed Provided education on low sodium diet Assessed need for readable accurate scales in home Provided education about placing scale on hard, flat surface Advised patient to weigh each morning after emptying bladder Discussed importance of daily weight and advised patient to weigh and record daily Discussed the importance of keeping all appointments with provider Screening for signs and symptoms of depression related to chronic disease state  Assessed social determinant of health barriers  Reviewed Heart Failure action plan Reviewed all upcoming scheduled appointments  Patient Self Care Activities:  Attend all scheduled provider appointments Attend church or other social activities Call pharmacy for medication refills 3-7 days in advance of running out of medications Call provider office for new concerns or questions  Notify RN Care Manager of TOC call rescheduling needs Participate in Transition of Care Program/Attend TOC scheduled calls Perform all self care activities independently  Perform IADL's (shopping, preparing meals, housekeeping, managing finances) independently Take medications as prescribed   call  office if I gain more than 2 pounds in one day or 5 pounds in one week track weight in diary use salt in moderation watch for swelling in feet, ankles and legs every day weigh myself daily develop a rescue plan follow rescue plan if symptoms flare-up eat more whole grains, fruits and vegetables, lean meats and healthy fats dress right for the weather, hot or cold  Plan:  Telephone follow up appointment with care management team member scheduled for:  08/28/23 @ 115 pm The patient has been provided with contact information for the care management team and has been advised to call with any health related questions or concerns.         Mliss Creed [facility], BSN RN Care Manager/ Transition of Care [place]/ [facility] [phone]

## 2023-08-26 ENCOUNTER — Encounter (HOSPITAL_COMMUNITY)

## 2023-08-26 ENCOUNTER — Telehealth: Payer: Self-pay

## 2023-08-26 NOTE — Progress Notes (Signed)
 Complex Care Management Care Guide Note  08/26/2023 Name: Leah Ferrell MRN: [id number] DOB: 03-Sep-1950  Leah Ferrell is a 73 y.o. year old female who is a primary care patient of Lavell Bari LABOR, FNP and is actively engaged with the care management team. I reached out to Leah Ferrell by phone today to assist with scheduling  with the Pharmacist.  Follow up plan: Unsuccessful telephone outreach attempt made. A HIPAA compliant phone message was left for the patient providing contact information and requesting a return call.  Jeoffrey Buffalo , RMA     [facility]  [facility], [facility] Guide  Direct Dial: [phone]  Website: [email or web address]

## 2023-08-27 ENCOUNTER — Telehealth: Payer: Self-pay | Admitting: Family

## 2023-08-27 ENCOUNTER — Encounter: Payer: Self-pay | Admitting: Family

## 2023-08-27 ENCOUNTER — Inpatient Hospital Stay: Admitting: Family

## 2023-08-27 NOTE — Progress Notes (Incomplete)
 HEART & VASCULAR TRANSITION OF CARE CONSULT NOTE     Referring Physician: Dr. Ricky PCP: Lavell Bari LABOR, FNP  Cardiologist: Deatrice Cage, MD  Chief Complaint:   HPI: Referred to clinic by Dr. Ricky for heart failure consultation.   Leah Ferrell is a 73 y.o. female with chronic diastolic heart failure, CAD, history of NSTEMI with 3V CABG, diabetes, PVD, hypertension and hyperlipidemia.  She was recently admitted last month with acute respiratory failure secondary to acute on chronic HFpEF.  Echo showed EF 60-65%, mild concentric LVH, GIIDD, RV normal, LA dilated, trivial MR.  She diuresed well with IV Lasix .  GDMT added at discharge and diuretics increased.  Today she presents for AHF [facility] clinic visit. Overall feeling ***. Denies palpitations, CP, dizziness, edema, or PND/Orthopnea. *** SOB. Appetite ok. No fever or chills. Weight at home *** pounds. Taking all medications. Denies ETOH, tobacco or drug use.   Cardiac Testing    Past Medical History:  Diagnosis Date   CAD (coronary artery disease)    a. 04/2012 NSTEMI: s/p DES to LAD.   Chronic diastolic CHF (congestive heart failure) (HCC)    Diabetes mellitus without complication (HCC)    Dysrhythmia    Environmental and seasonal allergies    Hyperlipidemia    Hypertension    Leukocytosis    Morbid obesity (HCC)    NSTEMI (non-ST elevated myocardial infarction) (HCC) 04/27/2012   PONV (postoperative nausea and vomiting)    PVD (peripheral vascular disease) (HCC)    a. 04/2012 ABI: R 0.49, L 0.39. Angiography 06/14: Significant ostial left common iliac artery stenosis extending into the distal aorta, severe left common femoral artery stenosis, bilateral SFA occlusion with heavy calcifications. Functionally, one-vessel runoff bilaterally below the knee   Renal artery stenosis (HCC)    s/p angioplasty/stenting bilateral renal arteries 03/16/19   Shortness of breath     Current Outpatient Medications  Medication  Sig Dispense Refill   acetaminophen  (TYLENOL ) 325 MG tablet Take 2 tablets (650 mg total) by mouth every 6 (six) hours as needed for mild pain (pain score 1-3) (or Fever >/= 101).     albuterol  (VENTOLIN  HFA) 108 (90 Base) MCG/ACT inhaler Inhale 2 puffs into the lungs every 6 (six) hours as needed for shortness of breath or wheezing. 8 g 2   Ascorbic Acid  (VITAMIN C) 1000 MG tablet Take 1,000 mg by mouth 3 (three) times daily.      aspirin  EC 81 MG tablet Take 1 tablet (81 mg total) by mouth daily with breakfast. In the morning 30 tablet 11   BD ULTRA-FINE PEN NEEDLES 29G X 12.7MM MISC USE PENNEEDLES 3 TIMES  DAILY AS DIRECTED 270 each 1   bismuth  subsalicylate (PEPTO BISMOL) 262 MG/15ML suspension Take 30 mLs by mouth every 6 (six) hours as needed for indigestion or diarrhea or loose stools.     blood glucose meter kit and supplies Dispense based on patient and insurance preference. Use up to four times daily as directed. (FOR ICD-10 E10.9, E11.9). 1 each 0   calcium  carbonate (OSCAL) 1500 (600 Ca) MG TABS tablet Take 600 mg of elemental calcium  by mouth 3 (three) times daily with meals. (Patient not taking: Reported on 08/20/2023)     Calcium  Carbonate-Vit D-Min (CALTRATE 600+D PLUS MINERALS) 600-800 MG-UNIT TABS Take 1 tablet by mouth 3 (three) times daily.      carvedilol  (COREG ) 25 MG tablet Take 1 tablet (25 mg total) by mouth 2 (two) times  daily with a meal. 180 tablet 2   Chlorpheniramine-Acetaminophen  (CORICIDIN HBP COLD/FLU PO) Take 1 tablet by mouth daily as needed (cough/cold).     Cholecalciferol  (VITAMIN D -3) 125 MCG (5000 UT) TABS Take 5,000 Units by mouth daily.     clopidogrel  (PLAVIX ) 75 MG tablet Take 1 tablet (75 mg total) by mouth daily. 90 tablet 3   fish oil-omega-3 fatty acids  1000 MG capsule Take 1 g by mouth daily.      furosemide  (LASIX ) 40 MG tablet Take 1 tablet (40 mg total) by mouth 2 (two) times daily. 60 tablet 3   glucose blood (GLUCOSE METER TEST) test strip Use 6-10  times a day, Uses ReliOn 900 each 12   hydroxypropyl methylcellulose (ISOPTO TEARS) 2.5 % ophthalmic solution Place 1 drop into both eyes 3 (three) times daily as needed (for dry eyes).     insulin  degludec (TRESIBA  FLEXTOUCH) 100 UNIT/ML FlexTouch Pen Inject 20 Units into the skin at bedtime.     insulin  lispro (HUMALOG  KWIKPEN) 200 UNIT/ML KwikPen Inject 15-30 Units into the skin 3 (three) times daily before meals.     Insulin  Syringe 27G X 1/2 0.5 ML MISC Use with Fiasp  insulin  TID 100 each 3   losartan  (COZAAR ) 50 MG tablet Take 1 tablet (50 mg total) by mouth daily. 30 tablet 1   montelukast  (SINGULAIR ) 10 MG tablet Take 1 tablet by mouth once daily with breakfast 90 tablet 1   nitroGLYCERIN  (NITROSTAT ) 0.4 MG SL tablet Place 1 tablet (0.4 mg total) under the tongue every 5 (five) minutes as needed for chest pain. 10 tablet 3   oxybutynin  (DITROPAN -XL) 5 MG 24 hr tablet Take 1 tablet (5 mg total) by mouth at bedtime. 90 tablet 1   polyethylene glycol (MIRALAX  / GLYCOLAX ) 17 g packet Take 17 g by mouth daily as needed.     potassium chloride  SA (KLOR-CON  M) 20 MEQ tablet Take 1 tablet (20 mEq total) by mouth daily. 30 tablet 1   RELION PEN NEEDLES 29G X MISC USE 1 NEEDLE TO INJECT INSULIN  SUBCUTANEOUSLY 3 TIMES DAILY 50 each 5   rosuvastatin  (CRESTOR ) 40 MG tablet Take 1 tablet by mouth once daily 90 tablet 0   Semaglutide , 2 MG/DOSE, (OZEMPIC , 2 MG/DOSE,) 8 MG/3ML SOPN Inject 2 mg into the skin once a week. 3 mL 0   spironolactone  (ALDACTONE ) 25 MG tablet Take 1 tablet (25 mg total) by mouth daily. 30 tablet 1   No current facility-administered medications for this visit.    Allergies  Allergen Reactions   Tape Rash      Social History   Socioeconomic History   Marital status: Widowed    Spouse name: Not on file   Number of children: 4   Years of education: completed school in [place]    Highest education level: Not on file  Occupational History   Occupation: retired   Tobacco Use   Smoking status: Former    Current packs/day: 0.00    Average packs/day: 1 pack/day for 36.8 years (36.8 ttl pk-yrs)    Types: Cigarettes    Start date: 04/21/1964    Quit date: 02/17/2001    Years since quitting: 22.5   Smokeless tobacco: Never  Vaping Use   Vaping status: Never Used  Substance and Sexual Activity   Alcohol  use: No    Alcohol /week: 0.0 standard drinks of alcohol    Drug use: No   Sexual activity: Not Currently    Birth control/protection: Post-menopausal  Other Topics Concern   Not on file  Social History Narrative   Lives alone - one level - all of her children live far away   Social Drivers of Health   Financial Resource Strain: Medium Risk (08/17/2023)   Overall Financial Resource Strain (CARDIA)    Difficulty of Paying Living Expenses: Somewhat hard  Food Insecurity: No Food Insecurity (08/20/2023)   Hunger Vital Sign    Worried About Running Out of Food in the Last Year: Never true    Ran Out of Food in the Last Year: Never true  Transportation Needs: No Transportation Needs (08/20/2023)   PRAPARE - Administrator, Civil Service (Medical): No    Lack of Transportation (Non-Medical): No  Physical Activity: Insufficiently Active (12/25/2022)   Exercise Vital Sign    Days of Exercise per Week: 3 days    Minutes of Exercise per Session: 30 min  Stress: No Stress Concern Present (12/25/2022)   [employer] of Occupational Health - Occupational Stress Questionnaire    Feeling of Stress : Not at all  Social Connections: Moderately Isolated (08/15/2023)   Social Connection and Isolation Panel    Frequency of Communication with Friends and Family: More than three times a week    Frequency of Social Gatherings with Friends and Family: More than three times a week    Attends Religious Services: 1 to 4 times per year    Active Member of [employer] or Organizations: No    Attends Banker Meetings: Never    Marital Status: Widowed   Intimate Partner Violence: Not At Risk (08/20/2023)   Humiliation, Afraid, Rape, and Kick questionnaire    Fear of Current or Ex-Partner: No    Emotionally Abused: No    Physically Abused: No    Sexually Abused: No      Family History  Problem Relation Age of Onset   Diabetes Mother    Hyperlipidemia Mother    Hypertension Mother    Peripheral vascular disease Mother    Other Sister        drowned   Other Sister        drowned   Diabetes Daughter        borderline   Hypertension Daughter    Diabetes Maternal Grandmother    Heart attack Maternal Grandfather    Heart disease Brother    Diabetes Brother    Alcohol  abuse Brother    Diabetes Son    Hypertension Son    Hyperlipidemia Son    Breast cancer Neg Hx     There were no vitals filed for this visit.  PHYSICAL EXAM: General:  *** appearing.  No respiratory difficulty HEENT: normal Neck: supple. JVD *** cm.  Cor: PMI nondisplaced. Regular rate & rhythm. No rubs, gallops or murmurs. Lungs: clear Abdomen: soft, nontender, nondistended. Good bowel sounds. Extremities: no cyanosis, clubbing, rash, edema  Neuro: alert & oriented x 3. Moves all 4 extremities w/o difficulty. Affect pleasant.   ECG: *** (Personally reviewed)     ASSESSMENT & PLAN: Chronic diastolic heart failure NYHA *** GDMT  Diuretic-continue Lasix  40 mg twice daily BB-continue Coreg  25 mg twice daily Ace/ARB/ARNI continue losartan  50 mg daily MRA continue spironolactone  25 mg daily SGLT2i  CAD status post CABG x 3 - LHC 2/25 with heavily calcified coronary arteries with severe underlying 3V CAD.  Patent grafts.  Severe progression of native CAD.  Hypertension - BP ***  Obesity - There is no height or  weight on file to calculate BMI.    Continue Plavix  75 mg daily, continue ASA 81 mg daily, continue statin 40 mg daily  Referred to HFSW (PCP, Medications, Transportation, ETOH Abuse, Drug Abuse, Insurance, Financial ): Yes or No Refer to  Pharmacy: Yes or No Refer to Home Health: Yes on No Refer to Advanced Heart Failure Clinic: Yes or no  Refer to General Cardiology: Yes or No  Follow up

## 2023-08-27 NOTE — Telephone Encounter (Signed)
 Called patient appointment made.

## 2023-08-28 ENCOUNTER — Other Ambulatory Visit: Payer: Self-pay | Admitting: *Deleted

## 2023-08-28 NOTE — Patient Outreach (Signed)
 Transition of Care week 2  Visit Note  08/28/2023  Name: Leah Ferrell MRN: [id number]          DOB: [birth year]  Situation: Patient enrolled in [facility] 30-day program. Visit completed with patient by telephone.   Background:     Past Medical History:  Diagnosis Date   CAD (coronary artery disease)    a. 04/2012 NSTEMI: s/p DES to LAD.   Chronic diastolic CHF (congestive heart failure) (HCC)    Diabetes mellitus without complication (HCC)    Dysrhythmia    Environmental and seasonal allergies    Hyperlipidemia    Hypertension    Leukocytosis    Morbid obesity (HCC)    NSTEMI (non-ST elevated myocardial infarction) (HCC) 04/27/2012   PONV (postoperative nausea and vomiting)    PVD (peripheral vascular disease) (HCC)    a. 04/2012 ABI: R 0.49, L 0.39. Angiography 06/14: Significant ostial left common iliac artery stenosis extending into the distal aorta, severe left common femoral artery stenosis, bilateral SFA occlusion with heavy calcifications. Functionally, one-vessel runoff bilaterally below the knee   Renal artery stenosis (HCC)    s/p angioplasty/stenting bilateral renal arteries 03/16/19   Shortness of breath     Assessment: Patient Reported Symptoms: Cognitive Cognitive Status: No symptoms reported, Able to follow simple commands, Alert and oriented to person, place, and time, Normal speech and language skills      Neurological Neurological Review of Symptoms: No symptoms reported    HEENT HEENT Symptoms Reported: No symptoms reported      Cardiovascular Cardiovascular Symptoms Reported: No symptoms reported Does patient have uncontrolled Hypertension?:  (pt is checking BP daily, reports systolic ranges 120-130's, cannot remember diastolic, does not have log in front of her) Weight: 186 lb (84.4 kg) Cardiovascular Self-Management Outcome: 3 (uncertain) Cardiovascular Comment: HF- pt continues weighing daily and keeping a log,  Reviewed HF action plan  Respiratory       Endocrine Endocrine Symptoms Reported: No symptoms reported Is patient diabetic?: Yes Is patient checking blood sugars at home?: Yes List most recent blood sugar readings, include date and time of day: Checks CBG QID   FBS today 204  pt states related to what she eats, RN CM reviewed carbohydrated modifed Endocrine Self-Management Outcome: 4 (good)  Gastrointestinal Gastrointestinal Symptoms Reported: No symptoms reported      Genitourinary Genitourinary Symptoms Reported: No symptoms reported    Integumentary Integumentary Symptoms Reported: No symptoms reported    Musculoskeletal Musculoskelatal Symptoms Reviewed: No symptoms reported        Psychosocial Psychosocial Symptoms Reported: No symptoms reported         There were no vitals filed for this visit.  Medications Reviewed Today     Reviewed by Aura Mliss LABOR, RN (Registered Nurse) on 08/28/23 at 1345  Med List Status: <None>   Medication Order Taking? Sig Documenting Provider Last Dose Status Informant  acetaminophen  (TYLENOL ) 325 MG tablet [id number]  Take 2 tablets (650 mg total) by mouth every 6 (six) hours as needed for mild pain (pain score 1-3) (or Fever >/= 101). Pearlean Manus, MD  Active Self, Pharmacy Records  albuterol  (VENTOLIN  HFA) [phone] Base) MCG/ACT inhaler [id number]  Inhale 2 puffs into the lungs every 6 (six) hours as needed for shortness of breath or wheezing. Pearlean Manus, MD  Active Self, Pharmacy Records  Ascorbic Acid  (VITAMIN C) 1000 MG tablet [id number]  Take 1,000 mg by mouth 3 (three) times daily.  [provider]  Active Self, Pharmacy  Records  aspirin  EC 81 MG tablet [id number]  Take 1 tablet (81 mg total) by mouth daily with breakfast. In the morning Pearlean Manus, MD  Active Self, Pharmacy Records  BD ULTRA-FINE PEN NEEDLES 29G X 12.7MM MISC [id number]  USE PENNEEDLES 3 TIMES  DAILY AS DIRECTED Hawks, Bari LABOR, FNP  Active Self, Pharmacy Records  bismuth  subsalicylate (PEPTO  BISMOL) 262 MG/15ML suspension [id number]  Take 30 mLs by mouth every 6 (six) hours as needed for indigestion or diarrhea or loose stools. [provider]  Active Self, Pharmacy Records  blood glucose meter kit and supplies [id number]  Dispense based on patient and insurance preference. Use up to four times daily as directed. (FOR ICD-10 E10.9, E11.9). Lavell Bari LABOR, FNP  Active Self, Pharmacy Records  calcium  carbonate (OSCAL) 1500 (600 Ca) MG TABS tablet [id number]  Take 600 mg of elemental calcium  by mouth 3 (three) times daily with meals.  Patient not taking: Reported on 08/20/2023   [provider]  Active Self, Pharmacy Records  Calcium  Carbonate-Vit D-Min (CALTRATE 600+D PLUS MINERALS) 600-800 MG-UNIT TABS [id number]  Take 1 tablet by mouth 3 (three) times daily.  [provider]  Active Self, Pharmacy Records  carvedilol  (COREG ) 25 MG tablet [id number]  Take 1 tablet (25 mg total) by mouth 2 (two) times daily with a meal. Emokpae, Courage, MD  Active Self, Pharmacy Records  Chlorpheniramine-Acetaminophen  (CORICIDIN HBP COLD/FLU PO) [id number]  Take 1 tablet by mouth daily as needed (cough/cold). [provider]  Active Self, Pharmacy Records  Cholecalciferol  (VITAMIN D -3) 125 MCG (5000 UT) TABS [id number]  Take 5,000 Units by mouth daily. [provider]  Active Self, Pharmacy Records  clopidogrel  (PLAVIX ) 75 MG tablet [id number]  Take 1 tablet (75 mg total) by mouth daily. Pearlean Manus, MD  Active Self, Pharmacy Records  fish oil-omega-3 fatty acids  1000 MG capsule [id number]  Take 1 g by mouth daily.  [provider]  Active Self, Pharmacy Records  furosemide  (LASIX ) 40 MG tablet [id number]  Take 1 tablet (40 mg total) by mouth 2 (two) times daily. Ricky Fines, MD  Active   glucose blood (GLUCOSE METER TEST) test strip [id number]  Use 6-10 times a day, Uses ReliOn Hawks, Bari LABOR, FNP  Active Self, Pharmacy Records  hydroxypropyl  methylcellulose (ISOPTO TEARS) 2.5 % ophthalmic solution [id number]  Place 1 drop into both eyes 3 (three) times daily as needed (for dry eyes). [provider]  Active Self, Pharmacy Records  insulin  degludec (TRESIBA  FLEXTOUCH) 100 UNIT/ML FlexTouch Pen [id number]  Inject 20 Units into the skin at bedtime. Ricky Fines, MD  Active   insulin  lispro (HUMALOG  KWIKPEN) 200 UNIT/ML KwikPen [id number]  Inject 15-30 Units into the skin 3 (three) times daily before meals. Ricky Fines, MD  Active   Insulin  Syringe 27G X 1/2 0.5 ML MISC [id number]  Use with Fiasp  insulin  TID Lavell Bari LABOR, FNP  Active Self, Pharmacy Records  losartan  (COZAAR ) 50 MG tablet [id number]  Take 1 tablet (50 mg total) by mouth daily. Ricky Fines, MD  Active   montelukast  (SINGULAIR ) 10 MG tablet [id number]  Take 1 tablet by mouth once daily with breakfast Lavell Bari LABOR, FNP  Active Self, Pharmacy Records  nitroGLYCERIN  (NITROSTAT ) 0.4 MG SL tablet [id number]  Place 1 tablet (0.4 mg total) under the tongue every 5 (five) minutes as needed for chest pain. Arlice Reichert, MD  Active Self, Pharmacy Records  oxybutynin  (DITROPAN -XL) 5 MG 24 hr tablet [id number]  Take 1 tablet (  5 mg total) by mouth at bedtime. Lavell Bari LABOR, FNP  Active Self, Pharmacy Records           Med Note DRENA CHUCK MATSU   Sat Aug 15, 2023  5:23 PM) Pt did state she has issues with her bladder but didn't clarify if she's had this recently  polyethylene glycol (MIRALAX  / GLYCOLAX ) 17 g packet [id number]  Take 17 g by mouth daily as needed. Ricky Fines, MD  Active   potassium chloride  SA (KLOR-CON  M) 20 MEQ tablet [id number]  Take 1 tablet (20 mEq total) by mouth daily. Ricky Fines, MD  Active   RELION PEN NEEDLES 29G X MISC [id number]  USE 1 NEEDLE TO INJECT INSULIN  SUBCUTANEOUSLY 3 TIMES DAILY Hawks, Bari LABOR, FNP  Active Self, Pharmacy Records  rosuvastatin  (CRESTOR ) 40 MG tablet [id number]  Take 1 tablet by mouth once daily Lavell Bari LABOR, FNP  Active Self, Pharmacy Records  Semaglutide , 2 MG/DOSE, (OZEMPIC , 2 MG/DOSE,) 8 MG/3ML SOPN [id number]  Inject 2 mg into the skin once a week. Lavell Bari LABOR, FNP  Active Self, Pharmacy Records  spironolactone  (ALDACTONE ) 25 MG tablet [id number]  Take 1 tablet (25 mg total) by mouth daily. Ricky Fines, MD  Active             Recommendation:   PCP Follow-up Specialty provider follow-up cardiology  Follow Up Plan:   Telephone follow-up 09/01/23 @ 1245 pm  Mliss Creed [facility], BSN RN Care Manager/ Transition of Care [place]/ [facility] [phone]

## 2023-08-28 NOTE — Patient Instructions (Signed)
 Visit Information  Thank you for taking time to visit with me today. Please don't hesitate to contact me if I can be of assistance to you before our next scheduled telephone appointment.  Our next appointment is by telephone on 09/01/23 at 1245 pm  Following is a copy of your care plan:   Goals Addressed             This Visit's Progress    [facility] Transitions of Care (TOC) Care Plan       Problems:  Recent Hospitalization for treatment of CHF, respiratory failure Pt reports she lives alone, has good support from adult children, pt states she is independent, continues to drive, checks blood pressure daily systolic ranges 120-130's, does not have log in front of her, does not remember diastolic readings, weighs daily 186 pounds today, checks CBG at least QID with fasting today 204,  pt did not attend primary care provider appointment on 7/10, rescheduled for 7/17, did not attend cardiology appointment on 7/9, rescheduled for 7/21, pt states due to conflict on calendar and her son could not take her at last minute to cardiology  Goal:  Over the next 30 days, the patient will not experience hospital readmission  Interventions:   Heart Failure Interventions: Discussed the importance of keeping all appointments with provider Provided patient with education about the role of exercise in the management of heart failure Assessed social determinant of health barriers  Reviewed healthy diet, heart healthy and carbohydrate modified Reinforced Heart Failure action plan Reviewed importance of continuing daily weights, blood pressure checks, CBG check and keeping logs, take log to provider visits Reviewed all upcoming scheduled appointments  Patient Self Care Activities:  Attend all scheduled provider appointments Attend church or other social activities Call pharmacy for medication refills 3-7 days in advance of running out of medications Call provider office for new concerns or questions   Notify RN Care Manager of TOC call rescheduling needs Participate in Transition of Care Program/Attend TOC scheduled calls Perform all self care activities independently  Perform IADL's (shopping, preparing meals, housekeeping, managing finances) independently Take medications as prescribed   call office if I gain more than 2 pounds in one day or 5 pounds in one week track weight in diary use salt in moderation watch for swelling in feet, ankles and legs every day weigh myself daily develop a rescue plan follow rescue plan if symptoms flare-up eat more whole grains, fruits and vegetables, lean meats and healthy fats dress right for the weather, hot or cold Take weight, blood pressure and blood sugar log to provider appointments  Plan: Telephone outreach with RN Care Manager scheduled for : 09/01/23 @ 1245 pm        The patient verbalized understanding of instructions, educational materials, and care plan provided today and DECLINED offer to receive copy of patient instructions, educational materials, and care plan.   Telephone follow up appointment with care management team member scheduled for: 09/01/23 @ 1245 pm  Please call the care guide team at [phone] if you need to cancel or reschedule your appointment.   Please call the Suicide and Crisis Lifeline: 988 call the [place]  National Suicide Prevention Lifeline: 425-297-1261 or TTY: 432-214-1771 TTY 442-393-7339) to talk to a trained counselor call 1-800-273-TALK (toll free, 24 hour hotline) go to [facility] [address] [place] [phone]) call the [facility] Crisis Line: 203-317-2455 call 911 if you are experiencing a Mental Health or Behavioral Health Crisis or  need someone to talk to.  Mliss Creed [facility], BSN RN Care Manager/ Transition of Care [place]/ [facility] [phone]

## 2023-08-31 ENCOUNTER — Telehealth: Payer: Self-pay

## 2023-08-31 NOTE — Telephone Encounter (Signed)
 Called to inform patient that her ozempic  and tresiba  have arrived in office and ready for pick up. Patient verbalized understanding and stated that she would pick up medication on 07/17 at her appointment.

## 2023-09-01 ENCOUNTER — Encounter (HOSPITAL_COMMUNITY)

## 2023-09-01 ENCOUNTER — Other Ambulatory Visit: Admitting: *Deleted

## 2023-09-01 NOTE — Patient Outreach (Signed)
 Transition of Care week 3  Visit Note  09/01/2023  Name: Leah Ferrell MRN: [id number]          DOB: Mar 02, 1950  Situation: Patient enrolled in [facility] 30-day program. Visit completed with patient by telephone.   Background:     Past Medical History:  Diagnosis Date   CAD (coronary artery disease)    a. 04/2012 NSTEMI: s/p DES to LAD.   Chronic diastolic CHF (congestive heart failure) (HCC)    Diabetes mellitus without complication (HCC)    Dysrhythmia    Environmental and seasonal allergies    Hyperlipidemia    Hypertension    Leukocytosis    Morbid obesity (HCC)    NSTEMI (non-ST elevated myocardial infarction) (HCC) 04/27/2012   PONV (postoperative nausea and vomiting)    PVD (peripheral vascular disease) (HCC)    a. 04/2012 ABI: R 0.49, L 0.39. Angiography 06/14: Significant ostial left common iliac artery stenosis extending into the distal aorta, severe left common femoral artery stenosis, bilateral SFA occlusion with heavy calcifications. Functionally, one-vessel runoff bilaterally below the knee   Renal artery stenosis (HCC)    s/p angioplasty/stenting bilateral renal arteries 03/16/19   Shortness of breath     Assessment: Patient Reported Symptoms: Cognitive Cognitive Status: No symptoms reported, Able to follow simple commands, Alert and oriented to person, place, and time, Normal speech and language skills      Neurological Neurological Review of Symptoms: No symptoms reported    HEENT HEENT Symptoms Reported: No symptoms reported      Cardiovascular Cardiovascular Symptoms Reported: No symptoms reported Weight: 185 lb (83.9 kg) Cardiovascular Self-Management Outcome: 3 (uncertain) Cardiovascular Comment: HF- reviewed HF action plan  Respiratory Respiratory Symptoms Reported: No symptoms reported    Endocrine Endocrine Symptoms Reported: No symptoms reported Is patient diabetic?: Yes Is patient checking blood sugars at home?: Yes List most recent blood sugar  readings, include date and time of day: FBS today 185 Endocrine Self-Management Outcome: 4 (good) Endocrine Comment: AIC 7.8 on 08/11/23  Gastrointestinal Gastrointestinal Symptoms Reported: No symptoms reported      Genitourinary Genitourinary Symptoms Reported: No symptoms reported    Integumentary Integumentary Symptoms Reported: No symptoms reported    Musculoskeletal Musculoskelatal Symptoms Reviewed: No symptoms reported        Psychosocial Psychosocial Symptoms Reported: No symptoms reported         There were no vitals filed for this visit.  Medications Reviewed Today     Reviewed by Aura Mliss LABOR, RN (Registered Nurse) on 09/01/23 at 1320  Med List Status: <None>   Medication Order Taking? Sig Documenting Provider Last Dose Status Informant  acetaminophen  (TYLENOL ) 325 MG tablet [id number]  Take 2 tablets (650 mg total) by mouth every 6 (six) hours as needed for mild pain (pain score 1-3) (or Fever >/= 101). Pearlean Manus, MD  Active Self, Pharmacy Records  albuterol  (VENTOLIN  HFA) [phone] Base) MCG/ACT inhaler [id number]  Inhale 2 puffs into the lungs every 6 (six) hours as needed for shortness of breath or wheezing. Pearlean Manus, MD  Active Self, Pharmacy Records  Ascorbic Acid  (VITAMIN C) 1000 MG tablet [id number]  Take 1,000 mg by mouth 3 (three) times daily.  [provider]  Active Self, Pharmacy Records  aspirin  EC 81 MG tablet [id number]  Take 1 tablet (81 mg total) by mouth daily with breakfast. In the morning Pearlean Manus, MD  Active Self, Pharmacy Records  BD ULTRA-FINE PEN NEEDLES 29G X 12.7MM MISC [id number]  USE  PENNEEDLES 3 TIMES  DAILY AS DIRECTED Hawks, Bari LABOR, FNP  Active Self, Pharmacy Records  bismuth  subsalicylate (PEPTO BISMOL) 262 MG/15ML suspension [id number]  Take 30 mLs by mouth every 6 (six) hours as needed for indigestion or diarrhea or loose stools. [provider]  Active Self, Pharmacy Records  blood glucose meter kit  and supplies [id number]  Dispense based on patient and insurance preference. Use up to four times daily as directed. (FOR ICD-10 E10.9, E11.9). Lavell Bari LABOR, FNP  Active Self, Pharmacy Records  calcium  carbonate (OSCAL) 1500 (600 Ca) MG TABS tablet [id number]  Take 600 mg of elemental calcium  by mouth 3 (three) times daily with meals.  Patient not taking: Reported on 08/20/2023   [provider]  Active Self, Pharmacy Records  Calcium  Carbonate-Vit D-Min (CALTRATE 600+D PLUS MINERALS) 600-800 MG-UNIT TABS [id number]  Take 1 tablet by mouth 3 (three) times daily.  [provider]  Active Self, Pharmacy Records  carvedilol  (COREG ) 25 MG tablet [id number]  Take 1 tablet (25 mg total) by mouth 2 (two) times daily with a meal. Emokpae, Courage, MD  Active Self, Pharmacy Records  Chlorpheniramine-Acetaminophen  (CORICIDIN HBP COLD/FLU PO) [id number]  Take 1 tablet by mouth daily as needed (cough/cold). [provider]  Active Self, Pharmacy Records  Cholecalciferol  (VITAMIN D -3) 125 MCG (5000 UT) TABS [id number]  Take 5,000 Units by mouth daily. [provider]  Active Self, Pharmacy Records  clopidogrel  (PLAVIX ) 75 MG tablet [id number]  Take 1 tablet (75 mg total) by mouth daily. Pearlean Manus, MD  Active Self, Pharmacy Records  fish oil-omega-3 fatty acids  1000 MG capsule [id number]  Take 1 g by mouth daily.  [provider]  Active Self, Pharmacy Records  furosemide  (LASIX ) 40 MG tablet [id number]  Take 1 tablet (40 mg total) by mouth 2 (two) times daily. Ricky Fines, MD  Active   glucose blood (GLUCOSE METER TEST) test strip [id number]  Use 6-10 times a day, Uses ReliOn Hawks, Bari LABOR, FNP  Active Self, Pharmacy Records  hydroxypropyl methylcellulose (ISOPTO TEARS) 2.5 % ophthalmic solution [id number]  Place 1 drop into both eyes 3 (three) times daily as needed (for dry eyes). [provider]  Active Self, Pharmacy Records  insulin  degludec (TRESIBA   FLEXTOUCH) 100 UNIT/ML FlexTouch Pen [id number]  Inject 20 Units into the skin at bedtime. Ricky Fines, MD  Active   insulin  lispro (HUMALOG  KWIKPEN) 200 UNIT/ML KwikPen [id number]  Inject 15-30 Units into the skin 3 (three) times daily before meals. Ricky Fines, MD  Active   Insulin  Syringe 27G X 1/2 0.5 ML MISC [id number]  Use with Fiasp  insulin  TID Lavell Bari LABOR, FNP  Active Self, Pharmacy Records  losartan  (COZAAR ) 50 MG tablet [id number]  Take 1 tablet (50 mg total) by mouth daily. Ricky Fines, MD  Active   montelukast  (SINGULAIR ) 10 MG tablet [id number]  Take 1 tablet by mouth once daily with breakfast Lavell Bari LABOR, FNP  Active Self, Pharmacy Records  nitroGLYCERIN  (NITROSTAT ) 0.4 MG SL tablet [id number]  Place 1 tablet (0.4 mg total) under the tongue every 5 (five) minutes as needed for chest pain. Arlice Reichert, MD  Active Self, Pharmacy Records  oxybutynin  (DITROPAN -XL) 5 MG 24 hr tablet [id number]  Take 1 tablet (5 mg total) by mouth at bedtime. Lavell Bari LABOR, FNP  Active Self, Pharmacy Records           Med Note DRENA CHUCK MATSU   Sat Aug 15, 2023  5:23 PM) Pt did state  she has issues with her bladder but didn't clarify if she's had this recently  polyethylene glycol (MIRALAX  / GLYCOLAX ) 17 g packet [id number]  Take 17 g by mouth daily as needed. Ricky Fines, MD  Active   potassium chloride  SA (KLOR-CON  M) 20 MEQ tablet [id number]  Take 1 tablet (20 mEq total) by mouth daily. Ricky Fines, MD  Active   RELION PEN NEEDLES 29G X MISC [id number]  USE 1 NEEDLE TO INJECT INSULIN  SUBCUTANEOUSLY 3 TIMES DAILY Hawks, Bari LABOR, FNP  Active Self, Pharmacy Records  rosuvastatin  (CRESTOR ) 40 MG tablet [id number]  Take 1 tablet by mouth once daily Lavell Bari LABOR, FNP  Active Self, Pharmacy Records  Semaglutide , 2 MG/DOSE, (OZEMPIC , 2 MG/DOSE,) 8 MG/3ML SOPN [id number]  Inject 2 mg into the skin once a week. Lavell Bari LABOR, FNP  Active Self, Pharmacy Records  spironolactone   (ALDACTONE ) 25 MG tablet [id number]  Take 1 tablet (25 mg total) by mouth daily. Ricky Fines, MD  Active             Recommendation:   PCP Follow-up  Follow Up Plan:   Telephone follow-up 09/09/23 @ 115 pm  Mliss Creed [facility], BSN RN Care Manager/ Transition of Care [place]/ [facility] [phone]

## 2023-09-01 NOTE — Patient Instructions (Signed)
 Visit Information  Thank you for taking time to visit with me today. Please don't hesitate to contact me if I can be of assistance to you before our next scheduled telephone appointment.  Our next appointment is by telephone on 09/09/23 at 115 pm  Following is a copy of your care plan:   Goals Addressed             This Visit's Progress    [facility] Transitions of Care (TOC) Care Plan       Problems:  Recent Hospitalization for treatment of CHF, respiratory failure Pt reports she lives alone, has good support from adult children, pt states she is independent, continues to drive, checks blood pressure daily systolic ranges 120-130's, does not have log in front of her, does not remember diastolic readings, weighs daily 186 pounds today, checks CBG at least QID with fasting today 204,  pt did not attend primary care provider appointment on 7/10, rescheduled for 7/17, did not attend cardiology appointment on 7/9, rescheduled for 7/21, pt states due to conflict on calendar and her son could not take her at last minute to cardiology 09/01/23- no new concerns reported, pt continues checking CBG QID, weighing daily, reports  blood pressure is good  Goal:  Over the next 30 days, the patient will not experience hospital readmission  Interventions:   Heart Failure Interventions: Discussed the importance of keeping all appointments with provider Provided patient with education about the role of exercise in the management of heart failure Assessed social determinant of health barriers  Reinforced healthy diet, heart healthy and carbohydrate modified Reviewed normal parameters for blood pressure Reviewed Heart Failure action plan Reinforced importance of continuing daily weights, blood pressure checks, CBG check and keeping logs, take log to provider visits Reviewed all upcoming scheduled appointments  Patient Self Care Activities:  Attend all scheduled provider appointments Attend church or other  social activities Call pharmacy for medication refills 3-7 days in advance of running out of medications Call provider office for new concerns or questions  Notify RN Care Manager of TOC call rescheduling needs Participate in Transition of Care Program/Attend TOC scheduled calls Perform all self care activities independently  Perform IADL's (shopping, preparing meals, housekeeping, managing finances) independently Take medications as prescribed   call office if I gain more than 2 pounds in one day or 5 pounds in one week track weight in diary use salt in moderation watch for swelling in feet, ankles and legs every day weigh myself daily develop a rescue plan follow rescue plan if symptoms flare-up eat more whole grains, fruits and vegetables, lean meats and healthy fats dress right for the weather, hot or cold Take weight, blood pressure and blood sugar log to provider appointments  Plan: Telephone outreach with RN Care Manager scheduled for : 09/09/23 @ 115 pm        The patient verbalized understanding of instructions, educational materials, and care plan provided today and DECLINED offer to receive copy of patient instructions, educational materials, and care plan.   Telephone follow up appointment with care management team member scheduled for: 09/09/23 @ 115 pm  Please call the care guide team at [phone] if you need to cancel or reschedule your appointment.   Please call the Suicide and Crisis Lifeline: 988 call the [place]  National Suicide Prevention Lifeline: (564) 596-5577 or TTY: 774-127-3310 TTY 614-623-2754) to talk to a trained counselor call 1-800-273-TALK (toll free, 24 hour hotline) go to [facility] [address]  [address] [place] [phone]) call the [facility] Line: [phone] call 911 if you are experiencing a Mental Health or Behavioral Health Crisis or need someone to talk to.  Mliss [facility], BSN RN Care  Manager/ Transition of Care [place]/ [facility] [phone]

## 2023-09-03 ENCOUNTER — Encounter: Payer: Self-pay | Admitting: Family

## 2023-09-03 ENCOUNTER — Ambulatory Visit: Admitting: Family

## 2023-09-03 VITALS — BP 135/53 | HR 58 | Temp 98.0°F | Ht 62.0 in | Wt 192.4 lb

## 2023-09-03 DIAGNOSIS — I252 Old myocardial infarction: Secondary | ICD-10-CM

## 2023-09-03 DIAGNOSIS — Z09 Encounter for follow-up examination after completed treatment for conditions other than malignant neoplasm: Secondary | ICD-10-CM | POA: Diagnosis not present

## 2023-09-03 DIAGNOSIS — E876 Hypokalemia: Secondary | ICD-10-CM | POA: Diagnosis not present

## 2023-09-03 DIAGNOSIS — I5033 Acute on chronic diastolic (congestive) heart failure: Secondary | ICD-10-CM

## 2023-09-03 DIAGNOSIS — I10 Essential (primary) hypertension: Secondary | ICD-10-CM | POA: Diagnosis not present

## 2023-09-03 DIAGNOSIS — E1151 Type 2 diabetes mellitus with diabetic peripheral angiopathy without gangrene: Secondary | ICD-10-CM

## 2023-09-03 DIAGNOSIS — Z951 Presence of aortocoronary bypass graft: Secondary | ICD-10-CM

## 2023-09-03 NOTE — Patient Instructions (Signed)
 Heart Failure: How to Manage Heart failure is a long-term condition where your heart can't pump enough blood through your body. When this happens, parts of your body don't get the blood and oxygen they need. There's no cure for heart failure, so it's important to take good care of yourself and follow the treatment plan you set with your health care provider. If you're living with heart failure, there are ways to help you manage the disease. How to manage lifestyle changes Living with heart failure requires you to make changes in your daily life. Your health care team will teach you about the changes you need to make. These changes can help improve your symptoms and lower your risk of going to the hospital. Work with your provider to create a plan for you. Activity Ask your provider about cardiac rehabilitation programs. These programs include physical activity. If no cardiac rehab program is available, ask your provider what exercises are safe for you to do. Ask what things are safe for you to do at home. Ask when you can go back to work or school. Pace your daily activities and allow time for rest. Managing stress It's normal to have many emotions when you're told you have heart failure. You may have fear, sadness, and anger. If you need help coping with any of these emotions, let your provider know. Here are some ways to help yourself manage these emotions: Talk to your friends and family about your condition. They can give you support and guidance. Explain your symptoms to them. If comfortable, you can invite them to attend appointments with you. Join a support group for people with heart failure. Talking with others who have the same symptoms may give you new ways of coping with your disease and your emotions. Accept help from others. Do not be ashamed if you need help. Use stress management techniques. Try things like meditation, breathing exercises, or listening to relaxing music. Conditions  like depression and anxiety are common in people with heart failure. Pay attention to changes in your mood, emotions, and stress levels. Tell your provider if you have any of the following symptoms: Trouble sleeping or a change in your sleeping patterns. Feeling sad or depressed. Losing interest in activities you normally enjoy. Feeling irritable or crying for no reason. Often worrying about the future. Work If heart failure affects your ability to work, you may need to talk with your provider about making a plan for changes. This may include: Reducing your work hours. Finding tasks that require less effort. Planning rest periods during work hours. Travel Talk with your provider if you plan to travel. There may be times when your provider suggests that you don't travel or you wait until your condition has improved. Bring your medicine and a list of your medicines. If you're traveling by public transportation (airplane, train, bus), keep your medicines with you in a carry-on bag. If you have special needs, contact the transportation company prior to traveling. This may include needs related to diet, oxygen, a wheelchair, a seating request, or help with luggage. Think about finding a medical facility in the area you'll be traveling to. Find out what your health insurance will cover. If you use oxygen, make sure you bring enough oxygen with you. If you have a battery-powered oxygen device, bring a fully charged extra battery with you. If you have an implanted device, bring a note from your provider and tell the security screening workers that you have the device. You may  need to go through special screening. Sexual activity  Ask your provider when it's safe for you to resume sexual activity. You may need to start slowly and increase intimacy over time. Regular exercise can benefit your sex life by building strength and endurance. Sleep If your condition affects your sleep, find ways to improve  how well you sleep at night. These tips may help: Sleep lying on your side, or sleep with your head raised. Try: Raising the head of your bed. Using more than one pillow. Ask your provider about screening for sleep apnea. Try to go to sleep and wake up at the same times every day. Sleep in a dark, cool room. Do not exercise or eat for a few hours before bedtime. Plan rest periods during the day. But don't take long naps.  Where to find support In addition to talking with your family or friends, consider talking with: A mental health professional or therapist. A member of your church, faith, or community group. Other sources of support include: Local support groups. Ask your provider about groups near you. Online support groups, such as those found through: Heart Failure Society of America: hfsa.org American Heart Association: supportnetwork.heart.org Local community agencies or social agencies. A palliative care specialist. Palliative care can help manage symptoms, promote comfort, improve quality of life, and maintain dignity. Where to find more information To learn more, go to these websites: Centers for Disease Control and Prevention at TonerPromos.no. Then: Click Health Topics. Type "heart failure" in the search box. American Heart Association: heart.org National Heart, Lung, and Blood Institute: BuffaloDryCleaner.gl Contact a health care provider if: You have trouble breathing when you're active. You have swelling in your feet or legs. You gain 2-3 lb (0.9-1.4 kg) in 24 hours, or 5 lb (2.3 kg) in a week. You have a dry cough. You're not able to take part in your usual physical activities. You feel dizzy or light-headed when you stand up. You have trouble sleeping. You have a decrease in appetite. You have symptoms of depression or anxiety. Get help right away if: You have trouble breathing when resting. You have trouble staying awake or you feel confused. You have chest pain. You  faint. You have fast or irregular heartbeats. These symptoms may be an emergency. Call 911 right away. Do not wait to see if the symptoms will go away. Do not drive yourself to the hospital. This information is not intended to replace advice given to you by your health care provider. Make sure you discuss any questions you have with your health care provider. Document Revised: 09/18/2022 Document Reviewed: 09/18/2022 Elsevier Patient Education  2024 [employer].

## 2023-09-03 NOTE — Progress Notes (Addendum)
 Subjective:    Patient ID: Leah Ferrell, female    DOB: [birth year], 73 y.o.   MRN: [id number]  Chief Complaint  Patient presents with   Medical Management of Chronic Issues   PT presents to the office today for hospital follow up. She went to the ED with SOB. She was admitted for acute respiratory failure with hypoxia related to CHF.   Per hospital notes, Acute hypoxemic respiratory failure secondary to acute on chronic HFpEF Prior 2D echocardiogram 05/2023 with LVEF 60-65% Continue to follow electrolytes and replete as needed with intention to maintain potassium above 4 and magnesium  above 2 as much as possible. Echo with LVEF 60-65% grade 2 diastolic dysfunction noted on 3/70 Follows with Dr. Darron as an outpatient (patient instructed to be compliant with outpatient visits). Continue spironolactone , losartan  and adjusted dose of Lasix ; Farxiga  has been discontinue as per patient reports secondary to high risk for UTIs. Patient has been educated about low-sodium diet, adequate hydration, daily weights and medication compliance. Coronary artery disease, status post CABG x 3 Continue statin Heart healthy diet discussed with patient. Diabetes with hyperglycemia Continue to follow CBGs fluctuation and A1c with further adjust hypoglycemic regimen as required Modify carbohydrate diet and adequate hydration discussed with patient   Her weight is stable from hospital. Her weight at the hospital was 194 lb. She is taking Lasix  40 mg BID and spironolactone  25 mg.      09/03/2023   12:03 PM 09/01/2023    1:23 PM 08/28/2023    1:36 PM  Last 3 Weights  Weight (lbs) 192 lb 6.4 oz 185 lb 186 lb  Weight (kg) 87.272 kg 83.915 kg 84.369 kg     Congestive Heart Failure Presents for follow-up visit. Associated symptoms include shortness of breath. Pertinent negatives include no edema or fatigue. The symptoms have been stable.  Hypertension This is a chronic problem. The current episode  started more than 1 year ago. The problem has been resolved since onset. The problem is controlled. Associated symptoms include shortness of breath. Pertinent negatives include no blurred vision, malaise/fatigue or peripheral edema. Risk factors for coronary artery disease include diabetes mellitus and dyslipidemia. The current treatment provides moderate improvement. Hypertensive end-organ damage includes PVD.  Diabetes She presents for her follow-up diabetic visit. She has type 2 diabetes mellitus. Associated symptoms include foot paresthesias. Pertinent negatives for diabetes include no blurred vision and no fatigue. Diabetic complications include peripheral neuropathy and PVD. Risk factors for coronary artery disease include dyslipidemia, diabetes mellitus, hypertension, sedentary lifestyle and post-menopausal. She is following a generally unhealthy diet. Her overall blood glucose range is 140-180 mg/dl.      Review of Systems  Constitutional:  Negative for fatigue and malaise/fatigue.  Eyes:  Negative for blurred vision.  Respiratory:  Positive for shortness of breath.   All other systems reviewed and are negative.   Social History   Socioeconomic History   Marital status: Widowed    Spouse name: Not on file   Number of children: 4   Years of education: completed school in [place]    Highest education level: Not on file  Occupational History   Occupation: retired  Tobacco Use   Smoking status: Former    Current packs/day: 0.00    Average packs/day: 1 pack/day for 36.8 years (36.8 ttl pk-yrs)    Types: Cigarettes    Start date: 04/21/1964    Quit date: 02/17/2001    Years since quitting: 22.5   Smokeless tobacco: Never  Vaping Use   Vaping status: Never Used  Substance and Sexual Activity   Alcohol  use: No    Alcohol /week: 0.0 standard drinks of alcohol    Drug use: No   Sexual activity: Not Currently    Birth control/protection: Post-menopausal  Other Topics Concern   Not  on file  Social History Narrative   Lives alone - one level - all of her children live far away   Social Drivers of Health   Financial Resource Strain: Medium Risk (08/17/2023)   Overall Financial Resource Strain (CARDIA)    Difficulty of Paying Living Expenses: Somewhat hard  Food Insecurity: No Food Insecurity (08/20/2023)   Hunger Vital Sign    Worried About Running Out of Food in the Last Year: Never true    Ran Out of Food in the Last Year: Never true  Transportation Needs: No Transportation Needs (08/20/2023)   PRAPARE - Administrator, Civil Service (Medical): No    Lack of Transportation (Non-Medical): No  Physical Activity: Insufficiently Active (12/25/2022)   Exercise Vital Sign    Days of Exercise per Week: 3 days    Minutes of Exercise per Session: 30 min  Stress: No Stress Concern Present (12/25/2022)   [employer] of Occupational Health - Occupational Stress Questionnaire    Feeling of Stress : Not at all  Social Connections: Moderately Isolated (08/15/2023)   Social Connection and Isolation Panel    Frequency of Communication with Friends and Family: More than three times a week    Frequency of Social Gatherings with Friends and Family: More than three times a week    Attends Religious Services: 1 to 4 times per year    Active Member of [employer] or Organizations: No    Attends Banker Meetings: Never    Marital Status: Widowed   Family History  Problem Relation Age of Onset   Diabetes Mother    Hyperlipidemia Mother    Hypertension Mother    Peripheral vascular disease Mother    Other Sister        drowned   Other Sister        drowned   Diabetes Daughter        borderline   Hypertension Daughter    Diabetes Maternal Grandmother    Heart attack Maternal Grandfather    Heart disease Brother    Diabetes Brother    Alcohol  abuse Brother    Diabetes Son    Hypertension Son    Hyperlipidemia Son    Breast cancer Neg Hx          Objective:   Physical Exam Vitals reviewed.  Constitutional:      General: She is not in acute distress.    Appearance: She is well-developed. She is obese.  HENT:     Head: Normocephalic and atraumatic.     Right Ear: Tympanic membrane normal.     Left Ear: Tympanic membrane normal.  Eyes:     Pupils: Pupils are equal, round, and reactive to light.  Neck:     Thyroid : No thyromegaly.  Cardiovascular:     Rate and Rhythm: Normal rate and regular rhythm.     Heart sounds: Normal heart sounds. No murmur heard. Pulmonary:     Effort: Pulmonary effort is normal. No respiratory distress.     Breath sounds: Normal breath sounds. No wheezing.  Abdominal:     General: Bowel sounds are normal. There is no distension.     Palpations:  Abdomen is soft.     Tenderness: There is no abdominal tenderness.  Musculoskeletal:        General: No tenderness. Normal range of motion.     Cervical back: Normal range of motion and neck supple.     Right lower leg: Edema (trace) present.     Left lower leg: Edema (trace) present.     Comments: Bilateral compression hose in place  Skin:    General: Skin is warm and dry.  Neurological:     Mental Status: She is alert and oriented to person, place, and time.     Cranial Nerves: No cranial nerve deficit.     Deep Tendon Reflexes: Reflexes are normal and symmetric.  Psychiatric:        Behavior: Behavior normal.        Thought Content: Thought content normal.        Judgment: Judgment normal.       BP (!) 135/53   Pulse (!) 58   Temp 98 F (36.7 C) (Temporal)   Ht 5' 2 (1.575 m)   Wt 192 lb 6.4 oz (87.3 kg)   BMI 35.19 kg/m      Assessment & Plan:  Leah Ferrell comes in today with chief complaint of Medical Management of Chronic Issues   Diagnosis and orders addressed:  1. Essential hypertension (Primary) - CMP14+EGFR - CBC with Differential/Platelet  2. Diabetes mellitus with peripheral vascular disease (HCC) -  CMP14+EGFR - CBC with Differential/Platelet  3. Hx of non-ST elevation myocardial infarction (NSTEMI) - CMP14+EGFR - CBC with Differential/Platelet - Brain natriuretic peptide  4. Hypokalemia - CMP14+EGFR - CBC with Differential/Platelet  5. Acute on chronic diastolic CHF (congestive heart failure) (HCC) - CMP14+EGFR - CBC with Differential/Platelet - Brain natriuretic peptide  6. S/P CABG x 3 - CMP14+EGFR - CBC with Differential/Platelet - Brain natriuretic peptide  7. Hospital discharge follow-up - CMP14+EGFR - CBC with Differential/Platelet   Labs pending Continue current medications  Weigh daily Low salt diet  Keep follow up with specialists  Health Maintenance reviewed Diet and exercise encouraged  Return in about 3 months (around 12/04/2023), or if symptoms worsen or fail to improve.    Bari Learn, FNP

## 2023-09-04 ENCOUNTER — Ambulatory Visit: Payer: Self-pay | Admitting: Family

## 2023-09-04 ENCOUNTER — Other Ambulatory Visit: Payer: Self-pay | Admitting: Family Medicine

## 2023-09-04 ENCOUNTER — Telehealth (HOSPITAL_COMMUNITY): Payer: Self-pay

## 2023-09-04 ENCOUNTER — Other Ambulatory Visit (HOSPITAL_COMMUNITY): Payer: Self-pay

## 2023-09-04 ENCOUNTER — Other Ambulatory Visit

## 2023-09-04 DIAGNOSIS — E875 Hyperkalemia: Secondary | ICD-10-CM

## 2023-09-04 LAB — CBC WITH DIFFERENTIAL/PLATELET
Basophils Absolute: 0 x10E3/uL (ref 0.0–0.2)
Basos: 0 %
EOS (ABSOLUTE): 0.2 x10E3/uL (ref 0.0–0.4)
Eos: 2 %
Hematocrit: 41.7 % (ref 34.0–46.6)
Hemoglobin: 13.4 g/dL (ref 11.1–15.9)
Immature Grans (Abs): 0.1 x10E3/uL (ref 0.0–0.1)
Immature Granulocytes: 1 %
Lymphocytes Absolute: 3.1 x10E3/uL (ref 0.7–3.1)
Lymphs: 30 %
MCH: 30.9 pg (ref 26.6–33.0)
MCHC: 32.1 g/dL (ref 31.5–35.7)
MCV: 96 fL (ref 79–97)
Monocytes Absolute: 0.7 x10E3/uL (ref 0.1–0.9)
Monocytes: 7 %
Neutrophils Absolute: 6.2 x10E3/uL (ref 1.4–7.0)
Neutrophils: 60 %
Platelets: 281 x10E3/uL (ref 150–450)
RBC: 4.33 x10E6/uL (ref 3.77–5.28)
RDW: 13.7 % (ref 11.7–15.4)
WBC: 10.3 x10E3/uL (ref 3.4–10.8)

## 2023-09-04 LAB — CMP14+EGFR
ALT: 36 IU/L — ABNORMAL HIGH (ref 0–32)
AST: 30 IU/L (ref 0–40)
Albumin: 4.2 g/dL (ref 3.8–4.8)
Alkaline Phosphatase: 99 IU/L (ref 44–121)
BUN/Creatinine Ratio: 42 — ABNORMAL HIGH (ref 12–28)
BUN: 53 mg/dL — ABNORMAL HIGH (ref 8–27)
Bilirubin Total: <0.2 mg/dL (ref 0.0–1.2)
CO2: 22 mmol/L (ref 20–29)
Calcium: 9.9 mg/dL (ref 8.7–10.3)
Chloride: 92 mmol/L — ABNORMAL LOW (ref 96–106)
Creatinine, Ser: 1.25 mg/dL — ABNORMAL HIGH (ref 0.57–1.00)
Globulin, Total: 3.6 g/dL (ref 1.5–4.5)
Glucose: 195 mg/dL — ABNORMAL HIGH (ref 70–99)
Potassium: 5.4 mmol/L — ABNORMAL HIGH (ref 3.5–5.2)
Sodium: 128 mmol/L — ABNORMAL LOW (ref 134–144)
Total Protein: 7.8 g/dL (ref 6.0–8.5)
eGFR: 46 mL/min/1.73 — ABNORMAL LOW (ref >59)

## 2023-09-04 LAB — BRAIN NATRIURETIC PEPTIDE: BNP: 55.9 pg/mL (ref 0.0–100.0)

## 2023-09-04 NOTE — Telephone Encounter (Signed)
 As of 7/17 appt, Farxiga  is discontinued.

## 2023-09-04 NOTE — Telephone Encounter (Signed)
 Called to confirm/remind patient of their appointment at the [facility] on 09/07/2023 8:15.   Appointment:   [x] Confirmed  [] Left mess   [] No answer/No voice mail  [] VM Full/unable to leave message  [] Phone not in service  Patient reminded to bring all medications and/or complete list.  Confirmed patient has transportation. Gave directions, instructed to utilize valet parking.

## 2023-09-07 ENCOUNTER — Ambulatory Visit (HOSPITAL_COMMUNITY)
Admission: RE | Admit: 2023-09-07 | Discharge: 2023-09-07 | Disposition: A | Discharge status: Home / Self Care | Source: Ambulatory Visit | Attending: Adult Health | Admitting: Adult Health

## 2023-09-07 ENCOUNTER — Ambulatory Visit (HOSPITAL_COMMUNITY): Payer: Self-pay | Admitting: Adult Health

## 2023-09-07 ENCOUNTER — Other Ambulatory Visit

## 2023-09-07 VITALS — BP 121/67 | HR 54 | Wt 190.8 lb

## 2023-09-07 DIAGNOSIS — Z7902 Long term (current) use of antithrombotics/antiplatelets: Secondary | ICD-10-CM | POA: Insufficient documentation

## 2023-09-07 DIAGNOSIS — I110 Hypertensive heart disease with heart failure: Secondary | ICD-10-CM | POA: Diagnosis not present

## 2023-09-07 DIAGNOSIS — Z951 Presence of aortocoronary bypass graft: Secondary | ICD-10-CM | POA: Diagnosis not present

## 2023-09-07 DIAGNOSIS — I2510 Atherosclerotic heart disease of native coronary artery without angina pectoris: Secondary | ICD-10-CM | POA: Insufficient documentation

## 2023-09-07 DIAGNOSIS — Z5986 Financial insecurity: Secondary | ICD-10-CM | POA: Diagnosis not present

## 2023-09-07 DIAGNOSIS — Z833 Family history of diabetes mellitus: Secondary | ICD-10-CM | POA: Insufficient documentation

## 2023-09-07 DIAGNOSIS — Z794 Long term (current) use of insulin: Secondary | ICD-10-CM | POA: Insufficient documentation

## 2023-09-07 DIAGNOSIS — E875 Hyperkalemia: Secondary | ICD-10-CM | POA: Diagnosis not present

## 2023-09-07 DIAGNOSIS — Z87891 Personal history of nicotine dependence: Secondary | ICD-10-CM | POA: Insufficient documentation

## 2023-09-07 DIAGNOSIS — Z8249 Family history of ischemic heart disease and other diseases of the circulatory system: Secondary | ICD-10-CM | POA: Insufficient documentation

## 2023-09-07 DIAGNOSIS — E1151 Type 2 diabetes mellitus with diabetic peripheral angiopathy without gangrene: Secondary | ICD-10-CM | POA: Diagnosis not present

## 2023-09-07 DIAGNOSIS — I25708 Atherosclerosis of coronary artery bypass graft(s), unspecified, with other forms of angina pectoris: Secondary | ICD-10-CM | POA: Diagnosis not present

## 2023-09-07 DIAGNOSIS — Z79899 Other long term (current) drug therapy: Secondary | ICD-10-CM | POA: Diagnosis not present

## 2023-09-07 DIAGNOSIS — Z7982 Long term (current) use of aspirin: Secondary | ICD-10-CM | POA: Insufficient documentation

## 2023-09-07 DIAGNOSIS — Z7985 Long-term (current) use of injectable non-insulin antidiabetic drugs: Secondary | ICD-10-CM | POA: Insufficient documentation

## 2023-09-07 DIAGNOSIS — I5032 Chronic diastolic (congestive) heart failure: Secondary | ICD-10-CM | POA: Insufficient documentation

## 2023-09-07 DIAGNOSIS — R001 Bradycardia, unspecified: Secondary | ICD-10-CM | POA: Insufficient documentation

## 2023-09-07 LAB — BASIC METABOLIC PANEL WITH GFR
Anion gap: 13 (ref 5–15)
BUN/Creatinine Ratio: 25 (ref 12–28)
BUN: 32 mg/dL — ABNORMAL HIGH (ref 8–23)
BUN: 35 mg/dL — ABNORMAL HIGH (ref 8–27)
CO2: 27 mmol/L (ref 22–32)
CO2: 21 mmol/L (ref 20–29)
Calcium: 10.3 mg/dL (ref 8.9–10.3)
Calcium: 9.9 mg/dL (ref 8.7–10.3)
Chloride: 90 mmol/L — ABNORMAL LOW (ref 98–111)
Chloride: 91 mmol/L — ABNORMAL LOW (ref 96–106)
Creatinine, Ser: 1.22 mg/dL — ABNORMAL HIGH (ref 0.44–1.00)
Creatinine, Ser: 1.4 mg/dL — ABNORMAL HIGH (ref 0.57–1.00)
GFR, Estimated: 47 mL/min — ABNORMAL LOW (ref >60)
Glucose, Bld: 188 mg/dL — ABNORMAL HIGH (ref 70–99)
Glucose: 124 mg/dL — ABNORMAL HIGH (ref 70–99)
Potassium: 4.5 mmol/L (ref 3.5–5.1)
Potassium: 4.8 mmol/L (ref 3.5–5.2)
Sodium: 130 mmol/L — ABNORMAL LOW (ref 135–145)
Sodium: 130 mmol/L — ABNORMAL LOW (ref 134–144)
eGFR: 40 mL/min/1.73 — ABNORMAL LOW (ref >59)

## 2023-09-07 NOTE — Patient Instructions (Signed)
 Medication Changes:  No Changes In Medications at this time.   Lab Work:  Labs done today, your results will be available in MyChart, we will contact you for abnormal readings.  Follow-Up in: as needed   At the [facility], you and your health needs are our priority. We have a designated team specialized in the treatment of Heart Failure. This Care Team includes your primary Heart Failure Specialized Cardiologist (physician), Advanced Practice Providers (APPs- Physician Assistants and Nurse Practitioners), and Pharmacist who all work together to provide you with the care you need, when you need it.   You may see any of the following providers on your designated Care Team at your next follow up:  Dr. Toribio Fuel Dr. Ezra Shuck Dr. Ria Commander Dr. Odis Brownie Greig Mosses, NP Caffie Shed, [place] [facility] [place], [place] Beckey Coe, NP [place] Lee, NP Tinnie Redman, PharmD   Please be sure to bring in all your medications bottles to every appointment.   Need to Contact Us :  If you have any questions or concerns before your next appointment please send us  a message through Welcome or call our office at [phone].    TO LEAVE A MESSAGE FOR THE NURSE SELECT OPTION 2, PLEASE LEAVE A MESSAGE INCLUDING: YOUR NAME DATE OF BIRTH CALL BACK NUMBER REASON FOR CALL**this is important as we prioritize the call backs  YOU WILL RECEIVE A CALL BACK THE SAME DAY AS LONG AS YOU CALL BEFORE 4:00 PM

## 2023-09-07 NOTE — Progress Notes (Signed)
 HEART & VASCULAR TRANSITION OF CARE CONSULT NOTE     Referring Physician: Dr Ricky PCP Lavell Bari LABOR, FNP  Cardiology: Dr Darron  Chief Complaint: Heart Failure  HPI: Referred to clinic by Dr Ricky for heart failure consultation.   Leah Ferrell is a 73 y.o. female with  a history of DMII, PAD, HTN, CAD, CABG, HFpEF. Intolerant SGLT2i due to UTIs.   Admitted 08/15/23 with Acute Hypoxic Respiratory Failure and A/C HFpEF. Diuresed with IV lasix  and transitioned to lasix  40 mg twice a day.   K was running higher when she saw her PCP. K supplement was stopped  Overall feeling fine. Denies SOB/PND/Orthopnea. She trying to walk 30 minutes. Wants to try and walk 6 days week. Denies chest pain. Appetite ok. No fever or chills. Weight at home 185 pounds. Taking all medications. Lives alone.    Cardiac Testing  2025 07/2023 Echo Limited LVEF 65-70% Grade IIDD. RV normal.   LHC 04/08/2023  Heavily calcified coronary arteries with severe underlying three-vessel coronary artery disease.  Patent grafts including LIMA to LAD, SVG to diagonal and SVG to OM 3.  Severe progression of native coronary artery disease including the right coronary artery which was not bypassed. 2.  Normal LV systolic function and mildly elevated left ventricular end-diastolic pressure. 3.  Widely patent left renal artery stent with no significant restenosis.  Patent right renal artery stent with significant in-stent restenosis. 4.  Very difficult but successful PCI and 3 overlapped drug-eluting stent placement to the right coronary artery.  Very difficult procedure due to diffuse and calcified disease.  I had to accept 20% residual stenosis given inability to fully post dilate the stent with a high-pressure noncompliant balloon.  Recommendations: Continue dual antiplatelet therapy indefinitely as tolerated. Continue aggressive treatment of risk factors. Right renal artery in-stent restenosis can be treated as  an outpatient if blood pressure continues to be uncontrolled in spite of 3 antihypertensive medications.   Past Medical History:  Diagnosis Date   CAD (coronary artery disease)    a. 04/2012 NSTEMI: s/p DES to LAD.   Chronic diastolic CHF (congestive heart failure) (HCC)    Diabetes mellitus without complication (HCC)    Dysrhythmia    Environmental and seasonal allergies    Hyperlipidemia    Hypertension    Leukocytosis    Morbid obesity (HCC)    NSTEMI (non-ST elevated myocardial infarction) (HCC) 04/27/2012   PONV (postoperative nausea and vomiting)    PVD (peripheral vascular disease) (HCC)    a. 04/2012 ABI: R 0.49, L 0.39. Angiography 06/14: Significant ostial left common iliac artery stenosis extending into the distal aorta, severe left common femoral artery stenosis, bilateral SFA occlusion with heavy calcifications. Functionally, one-vessel runoff bilaterally below the knee   Renal artery stenosis (HCC)    s/p angioplasty/stenting bilateral renal arteries 03/16/19   Shortness of breath     Current Outpatient Medications  Medication Sig Dispense Refill   acetaminophen  (TYLENOL ) 325 MG tablet Take 2 tablets (650 mg total) by mouth every 6 (six) hours as needed for mild pain (pain score 1-3) (or Fever >/= 101).     albuterol  (VENTOLIN  HFA) 108 (90 Base) MCG/ACT inhaler Inhale 2 puffs into the lungs every 6 (six) hours as needed for shortness of breath or wheezing. 8 g 2   Ascorbic Acid  (VITAMIN C) 1000 MG tablet Take 1,000 mg by mouth 3 (three) times daily.      aspirin  EC 81 MG  tablet Take 1 tablet (81 mg total) by mouth daily with breakfast. In the morning 30 tablet 11   BD ULTRA-FINE PEN NEEDLES 29G X 12.7MM MISC USE PENNEEDLES 3 TIMES  DAILY AS DIRECTED 270 each 1   bismuth  subsalicylate (PEPTO BISMOL) 262 MG/15ML suspension Take 30 mLs by mouth every 6 (six) hours as needed for indigestion or diarrhea or loose stools.     blood glucose meter kit and supplies Dispense based on  patient and insurance preference. Use up to four times daily as directed. (FOR ICD-10 E10.9, E11.9). 1 each 0   calcium  carbonate (OSCAL) 1500 (600 Ca) MG TABS tablet Take 600 mg of elemental calcium  by mouth 3 (three) times daily with meals.     Calcium  Carbonate-Vit D-Min (CALTRATE 600+D PLUS MINERALS) 600-800 MG-UNIT TABS Take 1 tablet by mouth 3 (three) times daily.      carvedilol  (COREG ) 25 MG tablet Take 1 tablet (25 mg total) by mouth 2 (two) times daily with a meal. 180 tablet 2   Chlorpheniramine-Acetaminophen  (CORICIDIN HBP COLD/FLU PO) Take 1 tablet by mouth daily as needed (cough/cold).     Cholecalciferol  (VITAMIN D -3) 125 MCG (5000 UT) TABS Take 5,000 Units by mouth daily.     clopidogrel  (PLAVIX ) 75 MG tablet Take 1 tablet (75 mg total) by mouth daily. 90 tablet 3   fish oil-omega-3 fatty acids  1000 MG capsule Take 1 g by mouth daily.      furosemide  (LASIX ) 40 MG tablet Take 1 tablet (40 mg total) by mouth 2 (two) times daily. 60 tablet 3   glucose blood (GLUCOSE METER TEST) test strip Use 6-10 times a day, Uses ReliOn 900 each 12   hydroxypropyl methylcellulose (ISOPTO TEARS) 2.5 % ophthalmic solution Place 1 drop into both eyes 3 (three) times daily as needed (for dry eyes).     insulin  degludec (TRESIBA  FLEXTOUCH) 100 UNIT/ML FlexTouch Pen Inject 20 Units into the skin at bedtime.     insulin  lispro (HUMALOG  KWIKPEN) 200 UNIT/ML KwikPen Inject 15-30 Units into the skin 3 (three) times daily before meals.     Insulin  Syringe 27G X 1/2 0.5 ML MISC Use with Fiasp  insulin  TID 100 each 3   losartan  (COZAAR ) 50 MG tablet Take 1 tablet (50 mg total) by mouth daily. 30 tablet 1   montelukast  (SINGULAIR ) 10 MG tablet Take 1 tablet by mouth once daily with breakfast 90 tablet 1   nitroGLYCERIN  (NITROSTAT ) 0.4 MG SL tablet Place 1 tablet (0.4 mg total) under the tongue every 5 (five) minutes as needed for chest pain. 10 tablet 3   oxybutynin  (DITROPAN -XL) 5 MG 24 hr tablet Take 1 tablet (5  mg total) by mouth at bedtime. 90 tablet 1   polyethylene glycol (MIRALAX  / GLYCOLAX ) 17 g packet Take 17 g by mouth daily as needed.     potassium chloride  SA (KLOR-CON  M) 20 MEQ tablet Take 1 tablet (20 mEq total) by mouth daily. (Patient taking differently: Take 20 mEq by mouth daily. Patient not currently taking until recheck BMP) 30 tablet 1   RELION PEN NEEDLES 29G X MISC USE 1 NEEDLE TO INJECT INSULIN  SUBCUTANEOUSLY 3 TIMES DAILY 50 each 5   rosuvastatin  (CRESTOR ) 40 MG tablet Take 1 tablet by mouth once daily 90 tablet 0   Semaglutide , 2 MG/DOSE, (OZEMPIC , 2 MG/DOSE,) 8 MG/3ML SOPN Inject 2 mg into the skin once a week. 3 mL 0   spironolactone  (ALDACTONE ) 25 MG tablet Take 1 tablet (25 mg total)  by mouth daily. 30 tablet 1   No current facility-administered medications for this encounter.    Allergies  Allergen Reactions   Tape Rash      Social History   Socioeconomic History   Marital status: Widowed    Spouse name: Not on file   Number of children: 4   Years of education: completed school in [place]    Highest education level: Not on file  Occupational History   Occupation: retired  Tobacco Use   Smoking status: Former    Current packs/day: 0.00    Average packs/day: 1 pack/day for 36.8 years (36.8 ttl pk-yrs)    Types: Cigarettes    Start date: 04/21/1964    Quit date: 02/17/2001    Years since quitting: 22.5   Smokeless tobacco: Never  Vaping Use   Vaping status: Never Used  Substance and Sexual Activity   Alcohol  use: No    Alcohol /week: 0.0 standard drinks of alcohol    Drug use: No   Sexual activity: Not Currently    Birth control/protection: Post-menopausal  Other Topics Concern   Not on file  Social History Narrative   Lives alone - one level - all of her children live far away   Social Drivers of Health   Financial Resource Strain: Medium Risk (08/17/2023)   Overall Financial Resource Strain (CARDIA)    Difficulty of Paying Living Expenses:  Somewhat hard  Food Insecurity: No Food Insecurity (08/20/2023)   Hunger Vital Sign    Worried About Running Out of Food in the Last Year: Never true    Ran Out of Food in the Last Year: Never true  Transportation Needs: No Transportation Needs (08/20/2023)   PRAPARE - Administrator, Civil Service (Medical): No    Lack of Transportation (Non-Medical): No  Physical Activity: Insufficiently Active (12/25/2022)   Exercise Vital Sign    Days of Exercise per Week: 3 days    Minutes of Exercise per Session: 30 min  Stress: No Stress Concern Present (12/25/2022)   [employer] of Occupational Health - Occupational Stress Questionnaire    Feeling of Stress : Not at all  Social Connections: Moderately Isolated (08/15/2023)   Social Connection and Isolation Panel    Frequency of Communication with Friends and Family: More than three times a week    Frequency of Social Gatherings with Friends and Family: More than three times a week    Attends Religious Services: 1 to 4 times per year    Active Member of [employer] or Organizations: No    Attends Banker Meetings: Never    Marital Status: Widowed  Intimate Partner Violence: Not At Risk (08/20/2023)   Humiliation, Afraid, Rape, and Kick questionnaire    Fear of Current or Ex-Partner: No    Emotionally Abused: No    Physically Abused: No    Sexually Abused: No      Family History  Problem Relation Age of Onset   Diabetes Mother    Hyperlipidemia Mother    Hypertension Mother    Peripheral vascular disease Mother    Other Sister        drowned   Other Sister        drowned   Diabetes Daughter        borderline   Hypertension Daughter    Diabetes Maternal Grandmother    Heart attack Maternal Grandfather    Heart disease Brother    Diabetes Brother    Alcohol  abuse Brother  Diabetes Son    Hypertension Son    Hyperlipidemia Son    Breast cancer Neg Hx     Vitals:   09/07/23 0816  BP: 121/67  Pulse: (!) 54   SpO2: 97%  Weight: 86.5 kg (190 lb 12.8 oz)   Wt Readings from Last 3 Encounters:  09/07/23 86.5 kg (190 lb 12.8 oz)  09/03/23 87.3 kg (192 lb 6.4 oz)  09/01/23 83.9 kg (185 lb)    PHYSICAL EXAM: General:   No resp difficulty Neck: no JVD.  Cor: Regular rate & rhythm. Lungs: clear Abdomen: soft, nontender, nondistended.  Extremities: no  edema Neuro: alert & oriented x3  ECG: SB 52 bpm    ASSESSMENT & PLAN: 1. Chronic HFpEF Echo LVEF 65-70% RV normal. Grade II DD Appears euvolemic. Continue lasix  40 mg twice a  day.  NYHA II GDMT  Diuretic-ReDs reading: 32 %, normal BB- Continue coreg  25 mg twice a day.   Ace/ARB/ARNI- Continue losartan  50 mg daily  MRA- On sprionolactone 25 mg daily. If K continues to run high will stop spiro.  SGLT2i- no frequent UTI Check BMET  Discussed low salt food choices.   2. CAD Multivessel disease- calcified arteries with 3V CA. Patent grafts LIMA=LAD, SVG to diagonal & SVG to OM.  On dual antiplatelet  therapy. Aspirin  + Plavix    3. Hyperkalmia  Off K supplements. If K continues to run high will stop spiro.  Check BMET   Referred to HFSW (PCP, Medications, Transportation, ETOH Abuse, Drug Abuse, Insurance, Financial ):  No Refer to Pharmacy:  No Refer to Home Health:  No Refer to Advanced Heart Failure Clinic: no  Refer to General Cardiology: Back to Dr Darron   Follow up as needed.  Al Gagen NP-C  8:30 AM

## 2023-09-08 ENCOUNTER — Ambulatory Visit: Payer: Self-pay | Admitting: Family

## 2023-09-09 ENCOUNTER — Other Ambulatory Visit: Payer: Self-pay | Admitting: *Deleted

## 2023-09-09 NOTE — Patient Instructions (Signed)
 Visit Information  Thank you for taking time to visit with me today. Please don't hesitate to contact me if I can be of assistance to you before our next scheduled telephone appointment.  Our next appointment is by telephone on 09/17/23 at 1015 am  Following is a copy of your care plan:   Goals Addressed             This Visit's Progress    [facility] Transitions of Care (TOC) Care Plan       Problems:  Recent Hospitalization for treatment of CHF, respiratory failure Pt reports she lives alone, has good support from adult children, pt states she is independent, continues to drive, checks blood pressure daily systolic ranges 120-130's, does not have log in front of her, does not remember diastolic readings, weighs daily 186 pounds today, checks CBG at least QID with fasting today 204,  pt did not attend primary care provider appointment on 7/10, rescheduled for 7/17, did not attend cardiology appointment on 7/9, rescheduled for 7/21, pt states due to conflict on calendar and her son could not take her at last minute to cardiology 09/09/23- no new concerns reported, pt continues checking CBG QID, FBS today 179, weighing daily, today 185 pounds, reports  blood pressure is good, systolic 130 today, no report for diastolic  Goal:  Over the next 30 days, the patient will not experience hospital readmission  Interventions:  Heart Failure Interventions: Discussed the importance of keeping all appointments with provider Provided patient with education about the role of exercise in the management of heart failure Assessed social determinant of health barriers  Reinforced healthy diet, heart healthy and carbohydrate modified Reinforced normal parameters for blood pressure Reinforced Heart Failure action plan Reviewed importance of continuing daily weights, blood pressure checks, CBG check and keeping logs, take log to provider visits Reviewed all upcoming scheduled appointments  Patient Self Care  Activities:  Attend all scheduled provider appointments Attend church or other social activities Call pharmacy for medication refills 3-7 days in advance of running out of medications Call provider office for new concerns or questions  Notify RN Care Manager of TOC call rescheduling needs Participate in Transition of Care Program/Attend TOC scheduled calls Perform all self care activities independently  Perform IADL's (shopping, preparing meals, housekeeping, managing finances) independently Take medications as prescribed   call office if I gain more than 2 pounds in one day or 5 pounds in one week track weight in diary use salt in moderation watch for swelling in feet, ankles and legs every day weigh myself daily develop a rescue plan follow rescue plan if symptoms flare-up eat more whole grains, fruits and vegetables, lean meats and healthy fats dress right for the weather, hot or cold Take weight, blood pressure and blood sugar log to provider appointments  Plan: Telephone outreach with RN Care Manager scheduled for : 09/17/23 @ 1015 am        The patient verbalized understanding of instructions, educational materials, and care plan provided today and DECLINED offer to receive copy of patient instructions, educational materials, and care plan.   Telephone follow up appointment with care management team member scheduled for: 09/17/23 @ 1015 am  Please call the care guide team at [phone] if you need to cancel or reschedule your appointment.   Please call the Suicide and Crisis Lifeline: 988 call the [place]  National Suicide Prevention Lifeline: 640-666-1411 or TTY: 3343715949 TTY 220-250-9347) to talk to a trained counselor call 1-800-273-TALK (toll free, 24  hour hotline) go to [facility] [address] [place] [phone]) call 911 if you are experiencing a Mental Health or Behavioral Health Crisis or need someone to talk  to.  Mliss Creed [facility], BSN RN Care Manager/ Transition of Care [place]/ [facility] [phone]

## 2023-09-09 NOTE — Patient Outreach (Signed)
 Transition of Care week 4  Visit Note  09/09/2023  Name: Leah Ferrell MRN: [id number]          DOB: 04/12/1950  Situation: Patient enrolled in [facility] 30-day program. Visit completed with patient by telephone.   Background:    Past Medical History:  Diagnosis Date   CAD (coronary artery disease)    a. 04/2012 NSTEMI: s/p DES to LAD.   Chronic diastolic CHF (congestive heart failure) (HCC)    Diabetes mellitus without complication (HCC)    Dysrhythmia    Environmental and seasonal allergies    Hyperlipidemia    Hypertension    Leukocytosis    Morbid obesity (HCC)    NSTEMI (non-ST elevated myocardial infarction) (HCC) 04/27/2012   PONV (postoperative nausea and vomiting)    PVD (peripheral vascular disease) (HCC)    a. 04/2012 ABI: R 0.49, L 0.39. Angiography 06/14: Significant ostial left common iliac artery stenosis extending into the distal aorta, severe left common femoral artery stenosis, bilateral SFA occlusion with heavy calcifications. Functionally, one-vessel runoff bilaterally below the knee   Renal artery stenosis (HCC)    s/p angioplasty/stenting bilateral renal arteries 03/16/19   Shortness of breath     Assessment: Patient Reported Symptoms: Cognitive Cognitive Status: No symptoms reported, Able to follow simple commands, Alert and oriented to person, place, and time      Neurological Neurological Review of Symptoms: No symptoms reported    HEENT HEENT Symptoms Reported: No symptoms reported      Cardiovascular Cardiovascular Symptoms Reported: No symptoms reported Weight: 185 lb (83.9 kg) Cardiovascular Self-Management Outcome: 4 (good) Cardiovascular Comment: HF-  reinforced HF action plan, importance of continuing daily weights  Respiratory Respiratory Symptoms Reported: No symptoms reported    Endocrine Endocrine Symptoms Reported: No symptoms reported Is patient diabetic?: Yes Is patient checking blood sugars at home?: Yes List most recent blood sugar  readings, include date and time of day: FBS today 179 Endocrine Self-Management Outcome: 4 (good)  Gastrointestinal Gastrointestinal Symptoms Reported: No symptoms reported      Genitourinary Genitourinary Symptoms Reported: No symptoms reported    Integumentary Integumentary Symptoms Reported: No symptoms reported    Musculoskeletal Musculoskelatal Symptoms Reviewed: No symptoms reported        Psychosocial Psychosocial Symptoms Reported: No symptoms reported         There were no vitals filed for this visit.  Medications Reviewed Today     Reviewed by Aura Mliss LABOR, RN (Registered Nurse) on 09/09/23 at 1051  Med List Status: <None>   Medication Order Taking? Sig Documenting Provider Last Dose Status Informant  acetaminophen  (TYLENOL ) 325 MG tablet [id number]  Take 2 tablets (650 mg total) by mouth every 6 (six) hours as needed for mild pain (pain score 1-3) (or Fever >/= 101). Pearlean Manus, MD  Active Self, Pharmacy Records  albuterol  (VENTOLIN  HFA) 108 [phone] Base) MCG/ACT inhaler [id number]  Inhale 2 puffs into the lungs every 6 (six) hours as needed for shortness of breath or wheezing. Pearlean Manus, MD  Active Self, Pharmacy Records  Ascorbic Acid  (VITAMIN C) 1000 MG tablet [id number]  Take 1,000 mg by mouth 3 (three) times daily.  [provider]  Active Self, Pharmacy Records  aspirin  EC 81 MG tablet [id number]  Take 1 tablet (81 mg total) by mouth daily with breakfast. In the morning Pearlean Manus, MD  Active Self, Pharmacy Records  BD ULTRA-FINE PEN NEEDLES 29G X 12.7MM MISC [id number]  USE PENNEEDLES 3 TIMES  DAILY AS  DIRECTED Hawks, Bari LABOR, FNP  Active Self, Pharmacy Records  bismuth  subsalicylate (PEPTO BISMOL) 262 MG/15ML suspension [id number]  Take 30 mLs by mouth every 6 (six) hours as needed for indigestion or diarrhea or loose stools. [provider]  Active Self, Pharmacy Records  blood glucose meter kit and supplies [id number]  Dispense based  on patient and insurance preference. Use up to four times daily as directed. (FOR ICD-10 E10.9, E11.9). Lavell Bari LABOR, FNP  Active Self, Pharmacy Records  calcium  carbonate (OSCAL) 1500 (600 Ca) MG TABS tablet [id number]  Take 600 mg of elemental calcium  by mouth 3 (three) times daily with meals. [provider]  Active Self, Pharmacy Records  Calcium  Carbonate-Vit D-Min (CALTRATE 600+D PLUS MINERALS) 600-800 MG-UNIT TABS [id number]  Take 1 tablet by mouth 3 (three) times daily.  [provider]  Active Self, Pharmacy Records  carvedilol  (COREG ) 25 MG tablet [id number]  Take 1 tablet (25 mg total) by mouth 2 (two) times daily with a meal. Pearlean Manus, MD  Expired 09/07/23 2359 Self, Pharmacy Records  Chlorpheniramine-Acetaminophen  (CORICIDIN HBP COLD/FLU PO) [id number]  Take 1 tablet by mouth daily as needed (cough/cold). [provider]  Active Self, Pharmacy Records  Cholecalciferol  (VITAMIN D -3) 125 MCG (5000 UT) TABS [id number]  Take 5,000 Units by mouth daily. [provider]  Active Self, Pharmacy Records  clopidogrel  (PLAVIX ) 75 MG tablet [id number]  Take 1 tablet (75 mg total) by mouth daily. Pearlean Manus, MD  Active Self, Pharmacy Records  fish oil-omega-3 fatty acids  1000 MG capsule [id number]  Take 1 g by mouth daily.  [provider]  Active Self, Pharmacy Records  furosemide  (LASIX ) 40 MG tablet [id number]  Take 1 tablet (40 mg total) by mouth 2 (two) times daily. Ricky Fines, MD  Active   glucose blood (GLUCOSE METER TEST) test strip [id number]  Use 6-10 times a day, Uses ReliOn Hawks, Bari LABOR, FNP  Active Self, Pharmacy Records  hydroxypropyl methylcellulose (ISOPTO TEARS) 2.5 % ophthalmic solution [id number]  Place 1 drop into both eyes 3 (three) times daily as needed (for dry eyes). [provider]  Active Self, Pharmacy Records  insulin  degludec (TRESIBA  FLEXTOUCH) 100 UNIT/ML FlexTouch Pen [id number]  Inject 20 Units into the skin  at bedtime. Ricky Fines, MD  Active   insulin  lispro (HUMALOG  KWIKPEN) 200 UNIT/ML KwikPen [id number]  Inject 15-30 Units into the skin 3 (three) times daily before meals. Ricky Fines, MD  Active   Insulin  Syringe 27G X 1/2 0.5 ML MISC [id number]  Use with Fiasp  insulin  TID Lavell Bari LABOR, FNP  Active Self, Pharmacy Records  losartan  (COZAAR ) 50 MG tablet [id number]  Take 1 tablet (50 mg total) by mouth daily. Ricky Fines, MD  Active   montelukast  (SINGULAIR ) 10 MG tablet [id number]  Take 1 tablet by mouth once daily with breakfast Lavell Bari LABOR, FNP  Active Self, Pharmacy Records  nitroGLYCERIN  (NITROSTAT ) 0.4 MG SL tablet [id number]  Place 1 tablet (0.4 mg total) under the tongue every 5 (five) minutes as needed for chest pain. Arlice Reichert, MD  Active Self, Pharmacy Records  oxybutynin  (DITROPAN -XL) 5 MG 24 hr tablet [id number]  Take 1 tablet (5 mg total) by mouth at bedtime. Lavell Bari LABOR, FNP  Active Self, Pharmacy Records           Med Note DRENA CHUCK MATSU   Sat Aug 15, 2023  5:23 PM) Pt did state she has issues with her bladder but didn't clarify if she's had this  recently  polyethylene glycol (MIRALAX  / GLYCOLAX ) 17 g packet [id number]  Take 17 g by mouth daily as needed. Ricky Fines, MD  Active   potassium chloride  SA (KLOR-CON  M) 20 MEQ tablet [id number]  Take 1 tablet (20 mEq total) by mouth daily.  Patient taking differently: Take 20 mEq by mouth daily. Patient not currently taking until recheck BMP   Ricky Fines, MD  Active   RELION PEN NEEDLES 29G X MISC [id number]  USE 1 NEEDLE TO INJECT INSULIN  SUBCUTANEOUSLY 3 TIMES DAILY Hawks, Bari LABOR, FNP  Active Self, Pharmacy Records  rosuvastatin  (CRESTOR ) 40 MG tablet [id number]  Take 1 tablet by mouth once daily Lavell Bari LABOR, FNP  Active Self, Pharmacy Records  Semaglutide , 2 MG/DOSE, (OZEMPIC , 2 MG/DOSE,) 8 MG/3ML SOPN [id number]  Inject 2 mg into the skin once a week. Lavell Bari LABOR, FNP  Active Self, Pharmacy  Records  spironolactone  (ALDACTONE ) 25 MG tablet [id number]  Take 1 tablet (25 mg total) by mouth daily. Ricky Fines, MD  Active             Recommendation:   PCP Follow-up  Follow Up Plan:   Telephone follow-up 09/17/23 @ 1015 am  Mliss Creed [facility], BSN RN Care Manager/ Transition of Care [place]/ [facility] [phone]

## 2023-09-10 ENCOUNTER — Telehealth: Payer: Self-pay

## 2023-09-17 ENCOUNTER — Encounter: Payer: Self-pay | Admitting: *Deleted

## 2023-09-17 ENCOUNTER — Other Ambulatory Visit: Payer: Self-pay | Admitting: Family

## 2023-09-17 ENCOUNTER — Telehealth: Payer: Self-pay | Admitting: *Deleted

## 2023-09-17 DIAGNOSIS — E785 Hyperlipidemia, unspecified: Secondary | ICD-10-CM

## 2023-09-18 ENCOUNTER — Encounter: Payer: Self-pay | Admitting: *Deleted

## 2023-09-21 ENCOUNTER — Telehealth: Payer: Self-pay

## 2023-09-21 NOTE — Telephone Encounter (Signed)
 Patient wants to know if she should continue her potassium? She says she was prescribed an expensive medication to help her kidneys. She wants to know what is wrong with her kidneys before she picks up that medication.

## 2023-09-21 NOTE — Telephone Encounter (Signed)
 Copied from CRM [id number]. Topic: Clinical - Medication Question >> Sep 21, 2023  9:23 AM Graeme ORN wrote: Reason for CRM: Patient called. Request call back from [facility] nurse. Has a question for her about potassium and her kidneys. Thank You

## 2023-09-21 NOTE — Telephone Encounter (Signed)
 Patient will start taking potassium.  She was referring to Farxiga  about medication that helps protect her kidneys.  She says it is too expensive.  She cannot afford $300.00 a month.

## 2023-09-21 NOTE — Telephone Encounter (Signed)
 Yes, she should continue to take potassium tablet.   I am unsure what medications she is taking for her kidneys? Who prescribed this?

## 2023-09-22 ENCOUNTER — Telehealth: Payer: Self-pay | Admitting: Cardiovascular Disease

## 2023-09-22 NOTE — Telephone Encounter (Signed)
 Pt calling in regards to which medication to take for her kidney. Please advise.

## 2023-09-22 NOTE — Progress Notes (Signed)
 Complex Care Management Care Guide Note  09/22/2023 Name: Leah Ferrell MRN: [id number] DOB: 11-14-1950  [name] is a 73 y.o. year old female who is a primary care patient of Lavell Bari LABOR, FNP and is actively engaged with the care management team. I reached out to [name] by phone today to assist with scheduling  with the Pharmacist.  Follow up plan: Telephone appointment with complex care management team member scheduled for:  10/02/2023  Jeoffrey Buffalo , RMA     [place]  [facility], [facility] Guide  Direct Dial: [phone]  Website: [email or web address]

## 2023-09-22 NOTE — Telephone Encounter (Signed)
 Spoke with pt regarding her medication. Pt stated her PCP put her on Farxiga  for her kidneys and she would like Dr. Renato opinion. Pt was told her question would be forwarded to Dr. Darron and his nurse. Pt verbalized understanding. All questions if any were answered.

## 2023-09-22 NOTE — Telephone Encounter (Signed)
 I will send to [place] to see if she can get patient assistance.

## 2023-09-23 NOTE — Telephone Encounter (Signed)
 Farxiga  is an excellent choice for both heart and kidneys.

## 2023-09-23 NOTE — Telephone Encounter (Signed)
 Returned the call to the patient. She stated that the Farxiga  is too expensive. According to Epic, PCP is looking into assistance. The patient is aware.

## 2023-10-02 ENCOUNTER — Other Ambulatory Visit (INDEPENDENT_AMBULATORY_CARE_PROVIDER_SITE_OTHER)

## 2023-10-02 DIAGNOSIS — Z7985 Long-term (current) use of injectable non-insulin antidiabetic drugs: Secondary | ICD-10-CM

## 2023-10-02 DIAGNOSIS — E119 Type 2 diabetes mellitus without complications: Secondary | ICD-10-CM

## 2023-10-02 DIAGNOSIS — Z794 Long term (current) use of insulin: Secondary | ICD-10-CM

## 2023-10-02 NOTE — Progress Notes (Unsigned)
   10/02/2023 Name: Leah Ferrell MRN: [id number] DOB: [birth year]  Chief Complaint  Patient presents with   Diabetes    {Visit Type:26650}   Subjective:  Care Team: Primary Care Provider: Lavell Bari LABOR, FNP ; Next Scheduled Visit: *** {careteamprovider:27366}  Medication Access/Adherence  Current Pharmacy:  [facility] [address], [place] - [address] [place] [address] Phone: [phone] Fax: [phone]  [facility]) - [place], [place] - [address] [facility] [address] [place]  [address] Phone: [phone] Fax: [phone]  [facility] [address] [place] [address] Phone: [phone] Fax: [phone]   Patient reports affordability concerns with their medications: {YES/NO:21197} Patient reports access/transportation concerns to their pharmacy: {YES/NO:21197} Patient reports adherence concerns with their medications:  {YES/NO:21197} ***   {Pharmacy S/O Choices:26420}   Objective:  Lab Results  Component Value Date   HGBA1C 7.8 (H) 08/11/2023    Lab Results  Component Value Date   CREATININE 1.40 (H) 09/07/2023   BUN 35 (H) 09/07/2023   NA 130 (L) 09/07/2023   K 4.8 09/07/2023   CL 91 (L) 09/07/2023   CO2 21 09/07/2023    Lab Results  Component Value Date   CHOL 141 08/11/2023   HDL 62 08/11/2023   LDLCALC 61 08/11/2023   TRIG 101 08/11/2023   CHOLHDL 2.3 08/11/2023    Medications Reviewed Today   Medications were not reviewed in this encounter       Assessment/Plan:   {Pharmacy A/P Choices:26421}  Follow Up Plan: ***  ***

## 2023-10-14 ENCOUNTER — Telehealth: Payer: Self-pay | Admitting: Cardiovascular Disease

## 2023-10-14 NOTE — Telephone Encounter (Signed)
 Pt c/o medication issue:  1. Name of Medication:   carvedilol  (COREG ) 25 MG tablet (Expired)    2. How are you currently taking this medication (dosage and times per day)?  Take 1 tablet (25 mg total) by mouth 2 (two) times daily with a meal.      3. Are you having a reaction (difficulty breathing--STAT)? No  4. What is your medication issue? Pt is requesting a callback regarding his medication making her lightheaded so she'd like to discuss a lower dosage. Please advise

## 2023-10-14 NOTE — Telephone Encounter (Signed)
 Call to patient to discuss concerns about blood pressure readings.   Patient reports she has been continuously lightheaded but can't tell me when it started. I do see a telephone encounter from 06/23/23 where she complained of lightheadedness and meds were adjusted. Patient was last seen by A. Clegg on 09/07/23 in heart failure transition of care clinic.   She states today she has only taken her coreg , she has not taken her lasix  or her spironolactone  due to her symptoms and since she has not had any leg swelling or SOB. Patient gives her BP as 115/64 as of last night. Patient concerns forwarded to Dr. Darron, Greig Mosses.

## 2023-10-15 ENCOUNTER — Telehealth: Payer: Self-pay | Admitting: *Deleted

## 2023-10-15 NOTE — Telephone Encounter (Signed)
 Called patient to advise that medication assistance Novolog  is available for pick up. Patient requests Leah Ferrell hold orders until November because she has overstock of all of her insulin  meds.

## 2023-10-16 ENCOUNTER — Ambulatory Visit: Payer: Self-pay | Admitting: Family

## 2023-10-16 MED ORDER — FUROSEMIDE 40 MG PO TABS: 40.0000 mg | ORAL_TABLET | Freq: Every day | ORAL | 3 refills | Status: DC

## 2023-10-16 NOTE — Telephone Encounter (Signed)
  I saw her for one visit and recommended follow up as needed.   Recommend follow up with [facility] for further recommendations.   Stepahnie Campo NP-C  8:02 AM

## 2023-10-16 NOTE — Telephone Encounter (Signed)
Attempted to reach the patient but the voicemail box was full.

## 2023-10-16 NOTE — Telephone Encounter (Signed)
 Per Epic note:  Leah Ferrell [facility]   10/16/23 11:44 AM Note Called patient back and reviewed her cardiologist note with her

## 2023-10-16 NOTE — Telephone Encounter (Signed)
 Patient states she is lighted headed for the past 2 weeks and BP is running on the lower side.Wants to know what you advised. This morning her BP was 119/58.

## 2023-10-16 NOTE — Telephone Encounter (Signed)
 Called patient back and reviewed her cardiologist note with her

## 2023-10-16 NOTE — Telephone Encounter (Signed)
 She is likely dehydrated.  Recommend stopping spironolactone .  Hold furosemide  for 2 days then resume at a lower dose of 40 mg once daily.    Increase fluid intake over the next few days.

## 2023-10-16 NOTE — Telephone Encounter (Signed)
 FYI Only or Action Required?: Action required by provider: clinical question for provider.  Patient was last seen in primary care on 09/03/2023 by Lavell Bari LABOR, FNP.  Called Nurse Triage reporting Advice Only.  Triage Disposition: Call PCP When Office is Open  Patient/caregiver understands and will follow disposition?: Yes          Copied from CRM [id number]. Topic: Clinical - Medication Question >> Oct 16, 2023 10:50 AM [name] wrote: Reason for CRM: patient has questions about the dosage of her rx carvedilol  (COREG ) 25 MG  tablet please call patient back [phone] Reason for Disposition  [1] Caller requesting NON-URGENT health information AND [2] PCP's office is the best resource  Answer Assessment - Initial Assessment Questions 1. REASON FOR CALL: What is the main reason for your call? or How can I best help you?     Patient requests to speak to Rosina who pt states is Radio producer.This RN asked pt what her question is but pt does not want to repeat everything and states she already spoke with Rosina this morning. This RN gathered that the question has to do with pt's carvedilol  and her BP running lower (SBP 108-110). Pt mentioned splitting pill in half.  Protocols used: Information Only Call - No Triage-A-AH

## 2023-10-16 NOTE — Addendum Note (Signed)
 Addended by: ESTELLE OLAM GRADE on: 10/16/2023 01:17 PM   Modules accepted: Orders

## 2023-10-20 ENCOUNTER — Telehealth: Payer: Self-pay

## 2023-11-04 ENCOUNTER — Telehealth: Payer: Self-pay | Admitting: Pharmacist

## 2023-11-06 ENCOUNTER — Other Ambulatory Visit: Payer: Self-pay

## 2023-11-06 ENCOUNTER — Inpatient Hospital Stay (HOSPITAL_COMMUNITY)
Admission: EM | Admit: 2023-11-06 | Discharge: 2023-11-08 | DRG: 304 | Disposition: A | Discharge status: Home / Self Care | Attending: Hospitalist | Admitting: Hospitalist

## 2023-11-06 ENCOUNTER — Emergency Department (HOSPITAL_COMMUNITY)

## 2023-11-06 ENCOUNTER — Encounter (HOSPITAL_COMMUNITY): Payer: Self-pay | Admitting: Emergency Medicine

## 2023-11-06 DIAGNOSIS — Z833 Family history of diabetes mellitus: Secondary | ICD-10-CM

## 2023-11-06 DIAGNOSIS — R0989 Other specified symptoms and signs involving the circulatory and respiratory systems: Secondary | ICD-10-CM | POA: Diagnosis not present

## 2023-11-06 DIAGNOSIS — E785 Hyperlipidemia, unspecified: Secondary | ICD-10-CM | POA: Diagnosis not present

## 2023-11-06 DIAGNOSIS — E1165 Type 2 diabetes mellitus with hyperglycemia: Secondary | ICD-10-CM | POA: Diagnosis present

## 2023-11-06 DIAGNOSIS — I3481 Nonrheumatic mitral (valve) annulus calcification: Secondary | ICD-10-CM | POA: Diagnosis present

## 2023-11-06 DIAGNOSIS — Z5986 Financial insecurity: Secondary | ICD-10-CM

## 2023-11-06 DIAGNOSIS — Z8249 Family history of ischemic heart disease and other diseases of the circulatory system: Secondary | ICD-10-CM

## 2023-11-06 DIAGNOSIS — Z83438 Family history of other disorder of lipoprotein metabolism and other lipidemia: Secondary | ICD-10-CM

## 2023-11-06 DIAGNOSIS — J9601 Acute respiratory failure with hypoxia: Secondary | ICD-10-CM | POA: Diagnosis not present

## 2023-11-06 DIAGNOSIS — I5033 Acute on chronic diastolic (congestive) heart failure: Secondary | ICD-10-CM | POA: Diagnosis not present

## 2023-11-06 DIAGNOSIS — Z811 Family history of alcohol abuse and dependence: Secondary | ICD-10-CM

## 2023-11-06 DIAGNOSIS — E1151 Type 2 diabetes mellitus with diabetic peripheral angiopathy without gangrene: Secondary | ICD-10-CM | POA: Diagnosis present

## 2023-11-06 DIAGNOSIS — I708 Atherosclerosis of other arteries: Secondary | ICD-10-CM | POA: Diagnosis not present

## 2023-11-06 DIAGNOSIS — Z7982 Long term (current) use of aspirin: Secondary | ICD-10-CM

## 2023-11-06 DIAGNOSIS — I110 Hypertensive heart disease with heart failure: Secondary | ICD-10-CM | POA: Diagnosis not present

## 2023-11-06 DIAGNOSIS — R0602 Shortness of breath: Secondary | ICD-10-CM | POA: Diagnosis not present

## 2023-11-06 DIAGNOSIS — R7989 Other specified abnormal findings of blood chemistry: Secondary | ICD-10-CM | POA: Diagnosis present

## 2023-11-06 DIAGNOSIS — Z87891 Personal history of nicotine dependence: Secondary | ICD-10-CM

## 2023-11-06 DIAGNOSIS — I2510 Atherosclerotic heart disease of native coronary artery without angina pectoris: Secondary | ICD-10-CM | POA: Diagnosis present

## 2023-11-06 DIAGNOSIS — E871 Hypo-osmolality and hyponatremia: Secondary | ICD-10-CM | POA: Diagnosis not present

## 2023-11-06 DIAGNOSIS — Z95820 Peripheral vascular angioplasty status with implants and grafts: Secondary | ICD-10-CM

## 2023-11-06 DIAGNOSIS — I701 Atherosclerosis of renal artery: Secondary | ICD-10-CM | POA: Diagnosis present

## 2023-11-06 DIAGNOSIS — I161 Hypertensive emergency: Secondary | ICD-10-CM | POA: Diagnosis not present

## 2023-11-06 DIAGNOSIS — I160 Hypertensive urgency: Secondary | ICD-10-CM | POA: Diagnosis not present

## 2023-11-06 DIAGNOSIS — I517 Cardiomegaly: Secondary | ICD-10-CM | POA: Diagnosis not present

## 2023-11-06 DIAGNOSIS — I10 Essential (primary) hypertension: Secondary | ICD-10-CM | POA: Diagnosis present

## 2023-11-06 DIAGNOSIS — I509 Heart failure, unspecified: Secondary | ICD-10-CM | POA: Diagnosis not present

## 2023-11-06 DIAGNOSIS — Z79899 Other long term (current) drug therapy: Secondary | ICD-10-CM

## 2023-11-06 DIAGNOSIS — I252 Old myocardial infarction: Secondary | ICD-10-CM | POA: Diagnosis not present

## 2023-11-06 DIAGNOSIS — Z955 Presence of coronary angioplasty implant and graft: Secondary | ICD-10-CM

## 2023-11-06 DIAGNOSIS — R0689 Other abnormalities of breathing: Secondary | ICD-10-CM | POA: Diagnosis not present

## 2023-11-06 DIAGNOSIS — Z7902 Long term (current) use of antithrombotics/antiplatelets: Secondary | ICD-10-CM | POA: Diagnosis not present

## 2023-11-06 DIAGNOSIS — E669 Obesity, unspecified: Secondary | ICD-10-CM | POA: Diagnosis present

## 2023-11-06 DIAGNOSIS — I150 Renovascular hypertension: Secondary | ICD-10-CM | POA: Diagnosis not present

## 2023-11-06 DIAGNOSIS — Z951 Presence of aortocoronary bypass graft: Secondary | ICD-10-CM

## 2023-11-06 DIAGNOSIS — Z6834 Body mass index (BMI) 34.0-34.9, adult: Secondary | ICD-10-CM

## 2023-11-06 DIAGNOSIS — Z794 Long term (current) use of insulin: Secondary | ICD-10-CM

## 2023-11-06 DIAGNOSIS — R0603 Acute respiratory distress: Secondary | ICD-10-CM | POA: Diagnosis present

## 2023-11-06 DIAGNOSIS — Z91048 Other nonmedicinal substance allergy status: Secondary | ICD-10-CM

## 2023-11-06 DIAGNOSIS — I70202 Unspecified atherosclerosis of native arteries of extremities, left leg: Secondary | ICD-10-CM | POA: Diagnosis present

## 2023-11-06 LAB — CBC
HCT: 40.7 % (ref 36.0–46.0)
Hemoglobin: 13.2 g/dL (ref 12.0–15.0)
MCH: 31 pg (ref 26.0–34.0)
MCHC: 32.4 g/dL (ref 30.0–36.0)
MCV: 95.5 fL (ref 80.0–100.0)
Platelets: 212 K/uL (ref 150–400)
RBC: 4.26 MIL/uL (ref 3.87–5.11)
RDW: 15.5 % (ref 11.5–15.5)
WBC: 9.6 K/uL (ref 4.0–10.5)
nRBC: 0 % (ref 0.0–0.2)

## 2023-11-06 LAB — URINALYSIS, ROUTINE W REFLEX MICROSCOPIC
Bacteria, UA: NONE SEEN
Bilirubin Urine: NEGATIVE
Glucose, UA: 50 mg/dL — AB
Hgb urine dipstick: NEGATIVE
Ketones, ur: NEGATIVE mg/dL
Leukocytes,Ua: NEGATIVE
Nitrite: NEGATIVE
Protein, ur: 30 mg/dL — AB
Specific Gravity, Urine: 1.004 — ABNORMAL LOW (ref 1.005–1.030)
pH: 7 (ref 5.0–8.0)

## 2023-11-06 LAB — BASIC METABOLIC PANEL WITH GFR
Anion gap: 10 (ref 5–15)
BUN: 12 mg/dL (ref 8–23)
CO2: 26 mmol/L (ref 22–32)
Calcium: 8.9 mg/dL (ref 8.9–10.3)
Chloride: 96 mmol/L — ABNORMAL LOW (ref 98–111)
Creatinine, Ser: 0.8 mg/dL (ref 0.44–1.00)
GFR, Estimated: >60 mL/min (ref >60)
Glucose, Bld: 211 mg/dL — ABNORMAL HIGH (ref 70–99)
Potassium: 3.8 mmol/L (ref 3.5–5.1)
Sodium: 132 mmol/L — ABNORMAL LOW (ref 135–145)

## 2023-11-06 LAB — BRAIN NATRIURETIC PEPTIDE: B Natriuretic Peptide: 298 pg/mL — ABNORMAL HIGH (ref 0.0–100.0)

## 2023-11-06 LAB — CBG MONITORING, ED: Glucose-Capillary: 228 mg/dL — ABNORMAL HIGH (ref 70–99)

## 2023-11-06 LAB — TROPONIN I (HIGH SENSITIVITY)
Troponin I (High Sensitivity): 560 ng/L (ref <18)
Troponin I (High Sensitivity): 666 ng/L (ref <18)
Troponin I (High Sensitivity): 805 ng/L

## 2023-11-06 LAB — MAGNESIUM: Magnesium: 2 mg/dL (ref 1.7–2.4)

## 2023-11-06 LAB — LACTIC ACID, PLASMA: Lactic Acid, Venous: 1.7 mmol/L (ref 0.5–1.9)

## 2023-11-06 MED ORDER — POLYETHYLENE GLYCOL 3350 17 G PO PACK: 17.0000 g | PACK | Freq: Every day | ORAL | Status: DC | PRN

## 2023-11-06 MED ORDER — CARVEDILOL 25 MG PO TABS
25.0000 mg | ORAL_TABLET | Freq: Two times a day (BID) | ORAL | Status: DC
  Administered 2023-11-07 – 2023-11-08 (×3): 25 mg via ORAL
  Filled 2023-11-06 (×3): qty 1

## 2023-11-06 MED ORDER — INSULIN GLARGINE 100 UNIT/ML ~~LOC~~ SOLN
20.0000 [IU] | Freq: Every day | SUBCUTANEOUS | Status: DC
  Administered 2023-11-07 (×2): 20 [IU] via SUBCUTANEOUS
  Filled 2023-11-06 (×3): qty 0.2

## 2023-11-06 MED ORDER — ENOXAPARIN SODIUM 40 MG/0.4ML IJ SOSY
40.0000 mg | PREFILLED_SYRINGE | INTRAMUSCULAR | Status: DC
  Administered 2023-11-07 – 2023-11-08 (×2): 40 mg via SUBCUTANEOUS
  Filled 2023-11-06 (×2): qty 0.4

## 2023-11-06 MED ORDER — IPRATROPIUM BROMIDE 0.02 % IN SOLN
RESPIRATORY_TRACT | Status: AC
  Administered 2023-11-06: 1 mg
  Filled 2023-11-06: qty 5

## 2023-11-06 MED ORDER — MONTELUKAST SODIUM 10 MG PO TABS
10.0000 mg | ORAL_TABLET | Freq: Every day | ORAL | Status: DC
  Administered 2023-11-07 – 2023-11-08 (×2): 10 mg via ORAL
  Filled 2023-11-06 (×2): qty 1

## 2023-11-06 MED ORDER — ALBUTEROL SULFATE (2.5 MG/3ML) 0.083% IN NEBU
INHALATION_SOLUTION | RESPIRATORY_TRACT | Status: AC
  Administered 2023-11-06: 10 mg
  Filled 2023-11-06: qty 12

## 2023-11-06 MED ORDER — FUROSEMIDE 10 MG/ML IJ SOLN
40.0000 mg | Freq: Once | INTRAMUSCULAR | Status: AC
Start: 2023-11-06 — End: 2023-11-06
  Administered 2023-11-06: 40 mg via INTRAVENOUS
  Filled 2023-11-06: qty 4

## 2023-11-06 MED ORDER — NITROGLYCERIN IN D5W 200-5 MCG/ML-% IV SOLN
5.0000 ug/min | INTRAVENOUS | Status: DC
  Administered 2023-11-06: 5 ug/min via INTRAVENOUS
  Filled 2023-11-06: qty 250

## 2023-11-06 MED ORDER — LOSARTAN POTASSIUM 25 MG PO TABS
50.0000 mg | ORAL_TABLET | Freq: Every day | ORAL | Status: DC
  Administered 2023-11-07: 50 mg via ORAL
  Filled 2023-11-06: qty 2

## 2023-11-06 MED ORDER — POTASSIUM CHLORIDE 10 MEQ/100ML IV SOLN
10.0000 meq | INTRAVENOUS | Status: AC
  Administered 2023-11-06 – 2023-11-07 (×4): 10 meq via INTRAVENOUS
  Filled 2023-11-06 (×4): qty 100

## 2023-11-06 MED ORDER — POTASSIUM CHLORIDE CRYS ER 20 MEQ PO TBCR
40.0000 meq | EXTENDED_RELEASE_TABLET | Freq: Two times a day (BID) | ORAL | Status: DC
  Administered 2023-11-07 – 2023-11-08 (×3): 40 meq via ORAL
  Filled 2023-11-06 (×4): qty 2

## 2023-11-06 MED ORDER — INSULIN ASPART 100 UNIT/ML IJ SOLN
15.0000 [IU] | Freq: Three times a day (TID) | INTRAMUSCULAR | Status: DC
  Administered 2023-11-07: 15 [IU] via SUBCUTANEOUS

## 2023-11-06 MED ORDER — ONDANSETRON HCL 4 MG PO TABS: 4.0000 mg | ORAL_TABLET | Freq: Four times a day (QID) | ORAL | Status: DC | PRN

## 2023-11-06 MED ORDER — ASPIRIN 81 MG PO TBEC
81.0000 mg | DELAYED_RELEASE_TABLET | Freq: Every day | ORAL | Status: DC
  Administered 2023-11-07 – 2023-11-08 (×2): 81 mg via ORAL
  Filled 2023-11-06 (×2): qty 1

## 2023-11-06 MED ORDER — ALBUTEROL SULFATE (2.5 MG/3ML) 0.083% IN NEBU: 3.0000 mL | INHALATION_SOLUTION | Freq: Four times a day (QID) | RESPIRATORY_TRACT | Status: DC | PRN

## 2023-11-06 MED ORDER — ONDANSETRON HCL 4 MG/2ML IJ SOLN: 4.0000 mg | Freq: Four times a day (QID) | INTRAMUSCULAR | Status: DC | PRN

## 2023-11-06 MED ORDER — CLOPIDOGREL BISULFATE 75 MG PO TABS
75.0000 mg | ORAL_TABLET | Freq: Every day | ORAL | Status: DC
  Administered 2023-11-07 – 2023-11-08 (×2): 75 mg via ORAL
  Filled 2023-11-06 (×2): qty 1

## 2023-11-06 MED ORDER — ROSUVASTATIN CALCIUM 20 MG PO TABS
40.0000 mg | ORAL_TABLET | Freq: Every day | ORAL | Status: DC
  Administered 2023-11-07 – 2023-11-08 (×2): 40 mg via ORAL
  Filled 2023-11-06 (×2): qty 2

## 2023-11-06 MED ORDER — FUROSEMIDE 10 MG/ML IJ SOLN
60.0000 mg | Freq: Two times a day (BID) | INTRAMUSCULAR | Status: DC
  Administered 2023-11-07 – 2023-11-08 (×3): 60 mg via INTRAVENOUS
  Filled 2023-11-06 (×3): qty 6

## 2023-11-06 NOTE — ED Triage Notes (Signed)
 BIB EMS from home.  Increased shob.  Pt sats with EMS were 89%.  Pt has significant cardiac hx and CHF.  EMS heard rales throughout.  Pt placed on cpap.  Pt was given 1 nitroglycerin  en route  BP systolic in the 200s.

## 2023-11-06 NOTE — ED Notes (Signed)
 Pt reports that she only would like Hadassah and Zebedee, her daughters, called if anything should happen.  Please call them before her sons

## 2023-11-06 NOTE — Consult Note (Addendum)
 Cardiology Consult Note:   Patient ID: MYNDI WAMBLE MRN: [id number]; DOB: 07-Apr-1950   Admission date: 11/06/2023  Primary Care Provider: Lavell Bari LABOR, FNP Primary Cardiologist: Deatrice Cage, MD  Primary Electrophysiologist:  None   Patient Profile:   Leah Ferrell is a 73 y.o. 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 She states all of a sudden today she began to develop shortness of breath. She states that her blood pressure during the week is usually in the 120s mmHg. However, yesterday she noticed her systolic blood pressure was 180 mmHg. She tried to take an additional medication to help lower it, but several hours later, she was sitting in her chair and realized she couldn't breathe. She did have some chest tightness, but not pain.   He presented to the emergency department with a peak blood pressure of 210/161 and a heart rate of 77 satting 89% on room air. She was in respiratory distress and required CPAP and nitroglycerin  for blood pressure control.  Her EKG did not show any new significant ST segment deviations (she does have baseline lateral submillimeter depressions that have been seen on prior EKGs). Troponin increased from 560> 666.   Of note in February 2025 she [facility] with a blood pressure of 222/68.  She had been having dyspnea on exertion and chest pressure 3 to 4 weeks prior. Upon arrival her troponins were elevated at 320 >355. She was taken to the Cath Lab the following day and although her grafts were patent, she did have severe disease of her nongrafted native right coronary artery.  He received a PCI to the RCA.   Past Medical History:  Diagnosis Date   CAD (coronary artery disease)    a.  04/2012 NSTEMI: s/p DES to LAD.   Chronic diastolic CHF (congestive heart failure) (HCC)    Diabetes mellitus without complication (HCC)    Dysrhythmia    Environmental and seasonal allergies    Hyperlipidemia    Hypertension    Leukocytosis    Morbid obesity (HCC)    NSTEMI (non-ST elevated myocardial infarction) (HCC) 04/27/2012   PONV (postoperative nausea and vomiting)    PVD (peripheral vascular disease) (HCC)    a. 04/2012 ABI: R 0.49, L 0.39. Angiography 06/14: Significant ostial left common iliac artery stenosis extending into the distal aorta, severe left common femoral artery stenosis, bilateral SFA occlusion with heavy calcifications. Functionally, one-vessel runoff bilaterally below the knee   Renal artery stenosis (HCC)    s/p angioplasty/stenting bilateral renal arteries 03/16/19   Shortness of breath     Past Surgical History:  Procedure Laterality Date   ABDOMINAL AORTAGRAM N/A 07/21/2012   Procedure: ABDOMINAL EZELLA;  Surgeon: Deatrice LABOR Cage, MD;  Location: [facility] CATH LAB;  Service: Cardiovascular;  Laterality: N/A;   ABDOMINAL AORTOGRAM W/LOWER EXTREMITY N/A 05/09/2020   Procedure: ABDOMINAL AORTOGRAM W/LOWER EXTREMITY;  Surgeon: Cage Deatrice LABOR, MD;  Location: [facility] INVASIVE CV LAB;  Service: Cardiovascular;  Laterality: N/A;   CARDIAC CATHETERIZATION     stents X 2   CATARACT EXTRACTION W/PHACO Left 09/08/2022   Procedure: CATARACT EXTRACTION PHACO AND INTRAOCULAR LENS PLACEMENT (IOC) with placement of Corticosteroid;  Surgeon: Harrie Agent, MD;  Location: [facility] ORS;  Service: Ophthalmology;  Laterality: Left;  CDE 8.48   CORONARY ANGIOGRAM  04/27/2012   Procedure: CORONARY ANGIOGRAM;  Surgeon: Aleene JINNY Passe, MD;  Location: [facility] CATH LAB;  Service: Cardiovascular;;   CORONARY ARTERY BYPASS GRAFT N/A 06/25/2020   Procedure: CORONARY ARTERY BYPASS GRAFTING (CABG)X3. LEFT INTERNAL MAMMARY ARTERY. RIGHT ENDOSCOPIC SAPHENOUS VEIN HARVESTING.;  Surgeon: German Bartlett PEDLAR, MD;   Location: [facility] OR;  Service: Open Heart Surgery;  Laterality: N/A;   CORONARY STENT INTERVENTION N/A 04/08/2023   Procedure: CORONARY STENT INTERVENTION;  Surgeon: Darron Deatrice LABOR, MD;  Location: [facility] INVASIVE CV LAB;  Service: Cardiovascular;  Laterality: N/A;  RCA   ENDARTERECTOMY FEMORAL Left 11/02/2013   Procedure: RESECTION LEFT COMMON FEMORAL ARTERY AND INSERTION OF INTERPOSITION 7mm HEMASHIELD GRAFT FROM LEFT EXTERNAL  ILIAC ARTERY TO LEFT PROFUNDA  FEMORIS ARTERY;  Surgeon: Lynwood JONETTA Collum, MD;  Location: [facility] OR;  Service: Vascular;  Laterality: Left;   EYE SURGERY Right 2017   FRACTURE SURGERY Right Jul 07, 2014   Right Foot-  [place] Home  by Heat duct   ILIAC ARTERY STENT Left 08/31/2013   LAD stent     LEFT HEART CATH AND CORONARY ANGIOGRAPHY N/A 05/09/2020   Procedure: LEFT HEART CATH AND CORONARY ANGIOGRAPHY;  Surgeon: Darron Deatrice LABOR, MD;  [facility]: [facility] INV[facility] LAB;  Service: Cardiovascular;  Laterality: N/A;   LEFT HEART CATH AND CORS/GRAFTS ANGIOGRAPHY N/A 04/08/2023   Procedure: LEFT HEART CATH AND CORS/GRAFTS ANGIOGRAPHY;  Surgeon: Darron Deatrice LABOR, MD;  [facility]: [facility] INV[facility] CV LAB;  Service: Cardiovascular;  Laterality: N/A;   LEFT HEART CATHETERIZATION WITH CORONARY ANGIOGRAM N/A 04/23/2012   Procedure: LEFT HEART CATHETERIZATION WITH CORONARY ANGIOGRAM;  Surgeon: Peter M Swaziland, MD;  Location: [facility] CATH LAB;  Service: Cardiovascular;  Laterality: N/A;   LOWER EXTREMITY ANGIOGRAM Left 08/31/2013   Procedure: LOWER EXTREMITY ANGIOGRAM;  Surgeon: Deatrice LABOR Darron, MD;  Location: [facility] CATH LAB;  Service: Cardiovascular;  Laterality: Left;   PERCUTANEOUS STENT INTERVENTION Left 08/31/2013   Procedure: PERCUTANEOUS STENT INTERVENTION;  Surgeon: Deatrice LABOR Darron, MD;  Location: [facility] CATH LAB;  Service: Cardiovascular;  Laterality: Left;  Common Iliac artery   PERIPHERAL VASCULAR CATHETERIZATION N/A 03/19/2016   Procedure: Abdominal Aortogram w/Lower Extremity;  Surgeon: Deatrice LABOR Darron,  MD;  Location: [facility] INVAS[facility] CV LAB;  Service: Cardiovascular;  Laterality: N/A;   PERIPHERAL VASCULAR CATHETERIZATION  03/19/2016   Procedure: Peripheral Vascular Balloon Angioplasty-CFA Left;  Surgeon: Deatrice LABOR Darron, MD;  Location: [facility] INV[facility] CV LAB;  Service: Cardiovascular;;   PERIPHERAL VASCULAR INTERVENTION Bilateral 03/16/2019   Procedure: PERIPHERAL VASCULAR INTERVENTION;  Surgeon: Darron Deatrice LABOR, MD;  [facility]: [facility] CV LAB;  Service: Cardiovascular;  Laterality: Bilateral;  RENAL   PERIPHERAL VASCULAR INTERVENTION Bilateral 05/09/2020   Procedure: PERIPHERAL VASCULAR INTERVENTION;  Surgeon: Darron Deatrice LABOR, MD;  Location: [facility] INV[facility] CV LAB;  Service: Cardiovascular;  Laterality: Bilateral;  Iliac   RENAL ANGIOGRAPHY  03/16/2019   RENAL ANGIOGRAPHY Bilateral 03/16/2019   Procedure: RENAL ANGIOGRAPHY;  Surgeon: Darron Deatrice LABOR, MD;  Location: [facility] INVAS[facility] CV LAB;  Service: Cardiovascular;  Laterality: Bilateral;   RENAL ANGIOGRAPHY Bilateral 04/08/2023   Procedure: RENAL ANGIOGRAPHY;  Surgeon: Darron Deatrice LABOR, MD;  Location: [facility] INVAS[facility] CV LAB;  Service: Cardiovascular;  Laterality: Bilateral;   TEE WITHOUT CARDIOVERSION N/A 06/25/2020   Procedure: TRANSESOPHAGEAL ECHOCARDIOGRAM (TEE);  Surgeon: German Bartlett PEDLAR, MD;  Location: [facility] OR;  Service: Open Heart Surgery;  Laterality: N/A;   TUMOR REMOVAL     tumor  removed from [place]     Medications Prior to Admission: Prior to Admission medications   Medication Sig Start Date End Date Taking? Authorizing Provider  acetaminophen  (TYLENOL ) 325 MG tablet Take 2 tablets (650 mg total) by mouth every 6 (six) hours as needed for mild pain (pain score 1-3) (or Fever >/= 101). 06/08/23   Emokpae, Courage, MD  albuterol  (VENTOLIN  HFA) 108 (90 Base) MCG/ACT inhaler Inhale 2 puffs into the lungs every 6 (six) hours as needed for shortness of breath or wheezing. 06/08/23   Pearlean Manus, MD  Ascorbic Acid  (VITAMIN C) 1000 MG tablet Take 1,000  mg by mouth 3 (three) times daily.     [provider]  aspirin  EC 81 MG tablet Take 1 tablet (81 mg total) by mouth daily with breakfast. In the morning 06/08/23   Pearlean Manus, MD  BD ULTRA-FINE PEN NEEDLES 29G X 12.7MM MISC USE PENNEEDLES 3 TIMES  DAILY AS DIRECTED 06/17/18   Lavell Lye A, FNP  bismuth  subsalicylate (PEPTO BISMOL) 262 MG/15ML suspension Take 30 mLs by mouth every 6 (six) hours as needed for indigestion or diarrhea or loose stools.    [provider]  blood glucose meter kit and supplies Dispense based on patient and insurance preference. Use up to four times daily as directed. (FOR ICD-10 E10.9, E11.9). 12/19/19   Lavell Lye LABOR, FNP  calcium  carbonate (OSCAL) 1500 (600 Ca) MG TABS tablet Take 600 mg of elemental calcium  by mouth 3 (three) times daily with meals.    [provider]  Calcium  Carbonate-Vit D-Min (CALTRATE 600+D PLUS MINERALS) 600-800 MG-UNIT TABS Take 1 tablet by mouth 3 (three) times daily.     [provider]  carvedilol  (COREG ) 25 MG tablet Take 1 tablet (25 mg total) by mouth 2 (two) times daily with a meal. 06/08/23 09/07/23  Pearlean Manus, MD  Chlorpheniramine-Acetaminophen  (CORICIDIN HBP COLD/FLU PO) Take 1 tablet by mouth daily as needed (cough/cold).    [provider]  Cholecalciferol  (VITAMIN D -3) 125 MCG (5000 UT) TABS Take 5,000 Units by mouth daily.    [provider]  clopidogrel  (PLAVIX ) 75 MG tablet Take 1 tablet (75 mg total) by mouth daily. 06/08/23   Pearlean Manus, MD  fish oil-omega-3 fatty acids  1000 MG capsule Take 1 g by mouth daily.     [provider]  furosemide  (LASIX ) 40 MG tablet Take 1 tablet (40 mg total) by mouth daily. 10/16/23 10/15/24  Darron Deatrice LABOR, MD  glucose blood (GLUCOSE METER TEST) test strip Use 6-10 times a day, Uses [employer] 12/19/19   Lavell Lye A, FNP  hydroxypropyl methylcellulose (ISOPTO TEARS) 2.5 % ophthalmic solution Place 1 drop into both  eyes 3 (three) times daily as needed (for dry eyes).    [provider]  insulin  degludec (TRESIBA  FLEXTOUCH) 100 UNIT/ML FlexTouch Pen Inject 20 Units into the skin at bedtime. 08/19/23   Ricky Fines, MD  insulin  lispro (HUMALOG  KWIKPEN) 200 UNIT/ML KwikPen Inject 15-30 Units into the skin 3 (three) times daily before meals. 08/19/23   Ricky Fines, MD  Insulin  Syringe 27G X 1/2 0.5 ML MISC Use with Fiasp  insulin  TID 04/02/20   Lavell Lye A, FNP  losartan  (COZAAR ) 50 MG tablet Take 1 tablet (50 mg total) by mouth daily. 08/20/23   Ricky Fines, MD  montelukast  (SINGULAIR ) 10 MG tablet Take 1 tablet by mouth once daily with breakfast 05/05/23   Lavell Lye A, FNP  nitroGLYCERIN  (NITROSTAT ) 0.4 MG SL tablet Place 1  tablet (0.4 mg total) under the tongue every 5 (five) minutes as needed for chest pain. 04/10/23   Arlice Reichert, MD  oxybutynin  (DITROPAN -XL) 5 MG 24 hr tablet Take 1 tablet (5 mg total) by mouth at bedtime. 05/21/23   Lavell Lye A, FNP  polyethylene glycol (MIRALAX  / GLYCOLAX ) 17 g packet Take 17 g by mouth daily as needed. 08/19/23   Ricky Fines, MD  potassium chloride  SA (KLOR-CON  M) 20 MEQ tablet Take 1 tablet (20 mEq total) by mouth daily. Patient taking differently: Take 20 mEq by mouth daily. Patient not currently taking until recheck BMP 08/19/23   Ricky Fines, MD  RELION PEN NEEDLES 29G X MISC USE 1 NEEDLE TO INJECT INSULIN  SUBCUTANEOUSLY 3 TIMES DAILY 01/31/16   Lavell Lye A, FNP  rosuvastatin  (CRESTOR ) 40 MG tablet Take 1 tablet by mouth once daily 09/18/23   Lavell Lye A, FNP  Semaglutide , 2 MG/DOSE, (OZEMPIC , 2 MG/DOSE,) 8 MG/3ML SOPN Inject 2 mg into the skin once a week. 09/09/22   Lavell Lye LABOR, FNP     Allergies:    Allergies  Allergen Reactions   Tape Rash    Social History:   Social History   Socioeconomic History   Marital status: Widowed    Spouse name: Not on file   Number of children: 4   Years of education: completed  school in [place]    Highest education level: Not on file  Occupational History   Occupation: retired  Tobacco Use   Smoking status: Former    Current packs/day: 0.00    Average packs/day: 1 pack/day for 36.8 years (36.8 ttl pk-yrs)    Types: Cigarettes    Start date: 04/21/1964    Quit date: 02/17/2001    Years since quitting: 22.7   Smokeless tobacco: Never  Vaping Use   Vaping status: Never Used  Substance and Sexual Activity   Alcohol  use: No    Alcohol /week: 0.0 standard drinks of alcohol    Drug use: No   Sexual activity: Not Currently    Birth control/protection: Post-menopausal  Other Topics Concern   Not on file  Social History Narrative   Lives alone - one level - all of her children live far away   Social Drivers of Health   Financial Resource Strain: Medium Risk (08/17/2023)   Overall Financial Resource Strain (CARDIA)    Difficulty of Paying Living Expenses: Somewhat hard  Food Insecurity: No Food Insecurity (08/20/2023)   Hunger Vital Sign    Worried About Running Out of Food in the Last Year: Never true    Ran Out of Food in the Last Year: Never true  Transportation Needs: No Transportation Needs (08/20/2023)   PRAPARE - Administrator, Civil Service (Medical): No    Lack of Transportation (Non-Medical): No  Physical Activity: Insufficiently Active (12/25/2022)   Exercise Vital Sign    Days of Exercise per Week: 3 days    Minutes of Exercise per Session: 30 min  Stress: No Stress Concern Present (12/25/2022)   [employer] of Occupational Health - Occupational Stress Questionnaire    Feeling of Stress : Not at all  Social Connections: Moderately Isolated (08/15/2023)   Social Connection and Isolation Panel    Frequency of Communication with Friends and Family: More than three times a week    Frequency of Social Gatherings with Friends and Family: More than three times a week    Attends Religious Services: 1 to 4 times per year  Active Member of  Clubs or Organizations: No    Attends Banker Meetings: Never    Marital Status: Widowed  Intimate Partner Violence: Not At Risk (08/20/2023)   Humiliation, Afraid, Rape, and Kick questionnaire    Fear of Current or Ex-Partner: No    Emotionally Abused: No    Physically Abused: No    Sexually Abused: No    Family History:   The patient's family history includes Alcohol  abuse in her brother; Diabetes in her brother, daughter, maternal grandmother, mother, and son; Heart attack in her maternal grandfather; Heart disease in her brother; Hyperlipidemia in her mother and son; Hypertension in her daughter, mother, and son; Other in her sister and sister; Peripheral vascular disease in her mother. There is no history of Breast cancer.    Review of Systems: [y] = yes, [ ]  = no    General: Weight gain [ ] ; Weight loss [ ] ; Anorexia [ ] ; Fatigue [ ] ; Fever [ ] ; Chills [ ] ; Weakness [ ]   Cardiac: Chest pain/pressure [ ] ; Resting SOB [ y]; Exertional SOB [ y]; Orthopnea [ ] ; Pedal Edema [ ] ; Palpitations [ ] ; Syncope [ ] ; Presyncope [ ] ; Paroxysmal nocturnal dyspnea[ ]   Pulmonary: Cough [ ] ; Wheezing[ ] ; Hemoptysis[ ] ; Sputum [ ] ; Snoring [ ]   GI: Vomiting[ ] ; Dysphagia[ ] ; Melena[ ] ; Hematochezia [ ] ; Heartburn[ ] ; Abdominal pain [ ] ; Constipation [ ] ; Diarrhea [ ] ; BRBPR [ ]   GU: Hematuria[ ] ; Dysuria [ ] ; Nocturia[ ]   Vascular: Pain in legs with walking [ ] ; Pain in feet with lying flat [ ] ; Non-healing sores [ ] ; Stroke [ ] ; TIA [ ] ; Slurred speech [ ] ;  Neuro: Headaches[ ] ; Vertigo[ ] ; Seizures[ ] ; Paresthesias[ ] ;Blurred vision [ ] ; Diplopia [ ] ; Vision changes [ ]   Ortho/Skin: Arthritis [ ] ; Joint pain [ ] ; Muscle pain [ ] ; Joint swelling [ ] ; Back Pain [ ] ; Rash [ ]   Psych: Depression[ ] ; Anxiety[ ]   Heme: Bleeding problems [ ] ; Clotting disorders [ ] ; Anemia [ ]   Endocrine: Diabetes [ ] ; Thyroid  dysfunction[ ]   Physical Exam/Data:   Vitals:   11/06/23 2045 11/06/23 2130 11/06/23  2145 11/06/23 2200  BP:  (!) 191/76 (!) 202/70 (!) 198/64  Pulse: 67 73 74 70  Resp: (!) 22 18 16 17   Temp:      TempSrc:      SpO2: 100% 97% 97% 97%  Height:       No intake or output data in the 24 hours ending 11/06/23 2211 There were no vitals filed for this visit. Body mass index is 33.84 kg/m.  General:  Well nourished, well developed, in no acute distress HEENT: normal Neck: No JVD present  Cardiac:  normal S1, S2 Lungs:  clear to auscultation bilaterally Abd: soft, mild tenderness in LLQ Ext: no edema Musculoskeletal:  No deformities Skin: warm and dry  Psych:  Normal affect    EKG:  Normal sinus rhythm at a rate of 69 bpm. PVC present. Compared to the EKG on 09/07/2023 sinus bradycardia is no longer present   Telemetry- Sinus rhythm with occasional PVCs. Personally reviewed.   Relevant CV Studies: June 2025 ECHO  1. Left ventricular ejection fraction, by estimation, is 60 to 65%. The  left ventricle has normal function. The left ventricle has no regional  wall motion abnormalities. There is mild concentric left ventricular  hypertrophy. Left ventricular diastolic  parameters are consistent with Grade II diastolic dysfunction  (  pseudonormalization). Elevated left atrial pressure.   2. Right ventricular systolic function is normal. The right ventricular  size is normal.   3. Left atrial size was mildly dilated.   4. The mitral valve is degenerative. Trivial mitral valve regurgitation.  No evidence of mitral stenosis. Severe mitral annular calcification.   5. The inferior vena cava is dilated in size with <50% respiratory  variability, suggesting right atrial pressure of 15 mmHg.   February 2025 ECHO  1.  Heavily calcified coronary arteries with severe underlying three-vessel coronary artery disease.  Patent grafts including LIMA to LAD, SVG to diagonal and SVG to OM 3.  Severe progression of native coronary artery disease including the right coronary artery which  was not bypassed. 2.  Normal LV systolic function and mildly elevated left ventricular end-diastolic pressure. 3.  Widely patent left renal artery stent with no significant restenosis.  Patent right renal artery stent with significant in-stent restenosis. 4.  Very difficult but successful PCI and 3 overlapped drug-eluting stent placement to the right coronary artery.  Very difficult procedure due to diffuse and calcified disease.  I had to accept 20% residual stenosis given inability to fully post dilate the stent with a high-pressure noncompliant balloon.  Laboratory Data: Component     Latest Ref Rng 11/06/2023  Sodium     135 - 145 mmol/L 132 (L)   Potassium     3.5 - 5.1 mmol/L 3.8   Chloride     98 - 111 mmol/L 96 (L)   CO2     22 - 32 mmol/L 26   Glucose     70 - 99 mg/dL 788 (H)   BUN     8 - 23 mg/dL 12   Creatinine     9.55 - 1.00 mg/dL 9.19   Calcium      8.9 - 10.3 mg/dL 8.9   GFR, Estimated     >60 mL/min >60   Anion gap     5 - 15  10    Component     Latest Ref Rng 11/06/2023  WBC     4.0 - 10.5 K/uL 9.6   RBC     3.87 - 5.11 MIL/uL 4.26   Hemoglobin     12.0 - 15.0 g/dL 86.7   HCT     63.9 - 53.9 % 40.7   MCV     80.0 - 100.0 fL 95.5   MCH     26.0 - 34.0 pg 31.0   MCHC     30.0 - 36.0 g/dL 67.5   RDW     88.4 - 84.4 % 15.5   Platelets     150 - 400 K/uL 212   nRBC     0.0 - 0.2 % 0.0      Component     Latest Ref Rng 11/06/2023  Troponin I (High Sensitivity)     <18 ng/L 666 (HH)   Troponin I (High Sensitivity)      560 (HH)    Radiology/Studies:  DG Chest Portable 1 View Result Date: 11/06/2023 CLINICAL DATA:  Shortness of breath EXAM: PORTABLE CHEST 1 VIEW COMPARISON:  Chest x-ray 08/15/2023 FINDINGS: Heart is enlarged. Patient is status post cardiac surgery. There central pulmonary vascular congestion. There is no focal lung infiltrate, pleural effusion or pneumothorax. No acute fractures are seen. IMPRESSION: Cardiomegaly with central  pulmonary vascular congestion. Electronically Signed   By: Greig Pique M.D.   On: 11/06/2023 19:44  Assessment and Plan:  Leah Ferrell presented to the ED with shortness of breath found to have malignant hypertension, flash pulmonary edema, and elevated troponin.  On review, Leah Ferrell had a very similar presentation back in February. However, at that time she presented with shortness of breath and was not grossly volume overloaded in the setting of the really uncontrolled blood pressures with systolics in the 220s. Her troponins rose to around 300.  Coronary cath revealed patent bypass grafts and diffuse severe disease in the right coronary artery to which she received a PCI. Her presentation this time differs as she has significant pulmonary edema on her chest x-ray with respiratory distress requiring positive pressure ventilation in addition her troponins are higher.  Differential includes demand ischemia in the setting of hypertensive emergency/LVH and acute coronary syndrome. At the time of my evaluation she has had 1.7 L of urine output. She is laying flat and resting comfortably. Although ischemia of her grafts is possible, given her recent angiogram/PCI 6 month prior, EKG without signs of ischemia, and her complete resolution of her symptoms with BP control and diuresis, I am inclined to suspect this was a hypertensive emergency with flash pulmonary edema. However, we should obtain an ECHO to evaluate for WMA. Diagnostics: - Peak blood pressure 210/161 - BNP 298 - hs-troponin 560 > 666 - Lactate 1.7  Management: -Echocardiogram in the morning to evaluate for WMA  - Wean nitroglycerin  as tolerated with goal systolic blood pressure of no less than 158 mmHg in the first 24 hours   Merlene Blood, MD MS  Cardiology [facility]    I have personally evaluated and examined the patient. The history, physical exam, and medical decision making documented below were performed independently and  substantively by me. I have reviewed all relevant data, formulated the assessment and plan, and assumed responsibility for the management of this patient. My documentation reflects the substantive portion of the split/shared visit, in accordance with CMS and CPT guidelines. I have updated the overnight fellow's documentation above as appropriate.  I have personally performed the substantive portion of the medical decision making, including interpretation of diagnostic data, formulation of the management plan, and assessment of risks. In summation:  Leah Ferrell is a 73 year old with coronary artery disease and renal vascular hypertension who presents with elevated blood pressure and shortness of breath.  She has a history of coronary artery disease, having undergone a three-vessel bypass in 2022 and a PCI of her non-grafted native RCA in February. She experienced a sudden onset of elevated blood pressure reaching 180 mmHg, accompanied by shortness of breath. Her troponin levels were elevated from 560 to 600, although her baseline ST depressions on EKG remained unchanged. She was evaluated by a cardiology fellow the previous evening, with a recorded blood pressure of 210/161 mmHg.  Her current medications for coronary artery disease include aspirin , carvedilol , and Plavix . She received 60 mg of IV Lasix  today and is scheduled for another 60 mg later. Her home medication of losartan  has been administered, along with potassium and rosuvastatin  for secondary prevention and electrolyte management. Despite treatment, her blood pressure remains elevated at 180/54 mmHg today, elevated from when seeing the fellow previously.  She has renal vascular hypertension with bilateral renal stenting. She also has diabetes, hyperlipidemia, and obesity. Her admission CBC was normal, but she had hyponatremia and hyperglycemia, with a basic natriuretic peptide level of 298.  She recalls a previous echocardiogram in June which  showed no wall motion abnormalities  despite having had a PCI of her right coronary artery. It was noted that her mitral valve was thick and calcified, with mitral annular calcification, but the valves were not significantly leaky. She reports feeling better after the stenting of her RCA, although her blood pressure was very high before admission, which she believes contributed to fluid retention.  She is currently on oxygen, which she only uses in the hospital. She mentions that her oxygen levels dropped to 88, prompting her hospital admission. She describes her heart as 'hard like wood,' which she believes affects its ability to beat effectively.  Exam notable for  Gen: no distress, obese   Neck: No JVD Cardiac: No Rubs or Gallops, diastolic Murmur, Rrr +2 radial pulses Respiratory: Clear to auscultation bilaterally, normal effort, normal  respiratory rate GI: Soft, nontender, non-distended  MS: No  edema;  moves all extremities Integument: Skin feels warm Neuro:  At time of evaluation, alert and oriented to person/place/time/situation  Psych: Normal affect, patient feels ok  Personally reviewed data and interpretation:   LABS Troponin: 560 to 600 (11/06/2023) CBC: Within normal limits (11/06/2023) BNP: 298 (11/06/2023)  DIAGNOSTIC EKG: Baseline ST depressions unchanged (11/06/2023), SR on telemetry    In assessment and plan:   Hypertensive emergency with acute pulmonary edema and secondary non-ST elevation myocardial infarction (NSTEMI) Severe hypertension with blood pressure reaching 210/161, leading to acute pulmonary edema and secondary NSTEMI. Symptoms have improved with diuresis and blood pressure management. Current blood pressure is 180/54. Troponin levels elevated from 560 to 600. - Administer 60 mg IV Lasix  today and another 60 mg later today or tomorrow - Increase losartan  dose to 100 mg (she notes post renovascular stenting relative hypotension and given labile BP worried  about aggressive titration) - Order echocardiogram to evaluate for wall motion abnormalities - Monitor troponin levels and basic labs  Coronary artery disease, post-CABG and PCI Coronary artery disease with three-vessel CABG in 2022 and PCI of non-grafted native RCA in February. Currently managed with aspirin , carvedilol , and Plavix . No new wall motion abnormalities noted in previous echocardiogram despite PCI.  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 Current episode of severe hypertension may be related to this condition. 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 No immediate intervention planned unless echocardiogram shows concerning findings. - Beside echo shows no WMA; 3D MPR on TTE was difficult but shows degenerative valve disease; I cannot get a 3D MVA from this acquisiton   Hyperlipidemia Hyperlipidemia managed with rosuvastatin  for secondary prevention. - Continue rosuvastatin  for hyperlipidemia management  Stanly Leavens, MD FASE [facility] Cardiologist [facility]  [address], [address] [place], [place] [address] [phone]  9:20 AM

## 2023-11-06 NOTE — ED Provider Notes (Signed)
 [place] EMERGENCY DEPARTMENT AT [facility] Provider Note   CSN: [id number] Arrival date & time: 11/06/23  8096     Patient presents with: Respiratory Distress   Leah Ferrell is a 73 y.o. female.  She is brought in by EMS for respiratory distress and shortness of breath.  Symptoms started today.  No chest pain.  They said she had significant rales and work of breathing so placed her on CPAP.  Had already given her some nitroglycerin .  Sats were 89% on room air.  Level 5 caveat secondary to respiratory distress   The history is provided by the patient and the EMS personnel.  Shortness of Breath Severity:  Severe Onset quality:  Sudden Timing:  Constant Progression:  Unchanged Relieved by:  Nothing Associated symptoms: cough   Associated symptoms: no chest pain, no fever and no hemoptysis   Risk factors: no tobacco use        Prior to Admission medications   Medication Sig Start Date End Date Taking? Authorizing Provider  acetaminophen  (TYLENOL ) 325 MG tablet Take 2 tablets (650 mg total) by mouth every 6 (six) hours as needed for mild pain (pain score 1-3) (or Fever >/= 101). 06/08/23   Emokpae, Courage, MD  albuterol  (VENTOLIN  HFA) 108 (90 Base) MCG/ACT inhaler Inhale 2 puffs into the lungs every 6 (six) hours as needed for shortness of breath or wheezing. 06/08/23   Pearlean Manus, MD  Ascorbic Acid  (VITAMIN C) 1000 MG tablet Take 1,000 mg by mouth 3 (three) times daily.     [provider]  aspirin  EC 81 MG tablet Take 1 tablet (81 mg total) by mouth daily with breakfast. In the morning 06/08/23   Pearlean Manus, MD  BD ULTRA-FINE PEN NEEDLES 29G X 12.7MM MISC USE PENNEEDLES 3 TIMES  DAILY AS DIRECTED 06/17/18   Lavell Lye A, FNP  bismuth  subsalicylate (PEPTO BISMOL) 262 MG/15ML suspension Take 30 mLs by mouth every 6 (six) hours as needed for indigestion or diarrhea or loose stools.    [provider]  blood glucose meter kit and supplies  Dispense based on patient and insurance preference. Use up to four times daily as directed. (FOR ICD-10 E10.9, E11.9). 12/19/19   Lavell Lye LABOR, FNP  calcium  carbonate (OSCAL) 1500 (600 Ca) MG TABS tablet Take 600 mg of elemental calcium  by mouth 3 (three) times daily with meals.    [provider]  Calcium  Carbonate-Vit D-Min (CALTRATE 600+D PLUS MINERALS) 600-800 MG-UNIT TABS Take 1 tablet by mouth 3 (three) times daily.     [provider]  carvedilol  (COREG ) 25 MG tablet Take 1 tablet (25 mg total) by mouth 2 (two) times daily with a meal. 06/08/23 09/07/23  Pearlean Manus, MD  Chlorpheniramine-Acetaminophen  (CORICIDIN HBP COLD/FLU PO) Take 1 tablet by mouth daily as needed (cough/cold).    [provider]  Cholecalciferol  (VITAMIN D -3) 125 MCG (5000 UT) TABS Take 5,000 Units by mouth daily.    [provider]  clopidogrel  (PLAVIX ) 75 MG tablet Take 1 tablet (75 mg total) by mouth daily. 06/08/23   Pearlean Manus, MD  fish oil-omega-3 fatty acids  1000 MG capsule Take 1 g by mouth daily.     [provider]  furosemide  (LASIX ) 40 MG tablet Take 1 tablet (40 mg total) by mouth daily. 10/16/23 10/15/24  Darron Deatrice LABOR, MD  glucose blood (GLUCOSE METER TEST) test strip Use 6-10 times a day, Uses [employer] 12/19/19   Lavell Lye LABOR, FNP  hydroxypropyl methylcellulose (ISOPTO TEARS) 2.5 % ophthalmic solution Place 1 drop into both eyes 3 (three) times daily as needed (for dry eyes).    [provider]  insulin  degludec (TRESIBA  FLEXTOUCH) 100 UNIT/ML FlexTouch Pen Inject 20 Units into the skin at bedtime. 08/19/23   Ricky Fines, MD  insulin  lispro (HUMALOG  KWIKPEN) 200 UNIT/ML KwikPen Inject 15-30 Units into the skin 3 (three) times daily before meals. 08/19/23   Ricky Fines, MD  Insulin  Syringe 27G X 1/2 0.5 ML MISC Use with Fiasp  insulin  TID 04/02/20   Lavell Lye A, FNP  losartan  (COZAAR ) 50 MG tablet Take 1 tablet (50 mg total) by mouth  daily. 08/20/23   Ricky Fines, MD  montelukast  (SINGULAIR ) 10 MG tablet Take 1 tablet by mouth once daily with breakfast 05/05/23   Lavell Lye A, FNP  nitroGLYCERIN  (NITROSTAT ) 0.4 MG SL tablet Place 1 tablet (0.4 mg total) under the tongue every 5 (five) minutes as needed for chest pain. 04/10/23   Arlice Reichert, MD  oxybutynin  (DITROPAN -XL) 5 MG 24 hr tablet Take 1 tablet (5 mg total) by mouth at bedtime. 05/21/23   Lavell Lye LABOR, FNP  polyethylene glycol (MIRALAX  / GLYCOLAX ) 17 g packet Take 17 g by mouth daily as needed. 08/19/23   Ricky Fines, MD  potassium chloride  SA (KLOR-CON  M) 20 MEQ tablet Take 1 tablet (20 mEq total) by mouth daily. Patient taking differently: Take 20 mEq by mouth daily. Patient not currently taking until recheck BMP 08/19/23   Ricky Fines, MD  RELION PEN NEEDLES 29G X MISC USE 1 NEEDLE TO INJECT INSULIN  SUBCUTANEOUSLY 3 TIMES DAILY 01/31/16   Lavell Lye A, FNP  rosuvastatin  (CRESTOR ) 40 MG tablet Take 1 tablet by mouth once daily 09/18/23   Lavell Lye A, FNP  Semaglutide , 2 MG/DOSE, (OZEMPIC , 2 MG/DOSE,) 8 MG/3ML SOPN Inject 2 mg into the skin once a week. 09/09/22   Lavell Lye LABOR, FNP    Allergies: Tape    Review of Systems  Constitutional:  Negative for fever.  Respiratory:  Positive for cough and shortness of breath. Negative for hemoptysis.   Cardiovascular:  Negative for chest pain.    Updated Vital Signs BP (!) 180/54 (BP Location: Left Arm)   Pulse 67   Temp 98 F (36.7 C) (Oral)   Resp 15   Ht 5' 2 (1.575 m)   Wt 85.2 kg   SpO2 98%   BMI 34.35 kg/m   Physical Exam Vitals and nursing note reviewed.  Constitutional:      General: She is not in acute distress.    Appearance: Normal appearance. She is well-developed.  HENT:     Head: Normocephalic and atraumatic.  Eyes:     Conjunctiva/sclera: Conjunctivae normal.  Cardiovascular:     Rate and Rhythm: Normal rate and regular rhythm.     Heart sounds: No murmur  heard. Pulmonary:     Effort: Tachypnea, accessory muscle usage and respiratory distress present.     Breath sounds: No stridor. Rhonchi and rales present.  Abdominal:     Palpations: Abdomen is soft.     Tenderness: There is no abdominal tenderness. There is no guarding or rebound.  Musculoskeletal:        General: No deformity.     Cervical back: Neck supple.     Right lower leg: No edema.     Left lower leg: No edema.  Skin:    General: Skin is warm and dry.  Neurological:  General: No focal deficit present.     Mental Status: She is alert.     GCS: GCS eye subscore is 4. GCS verbal subscore is 5. GCS motor subscore is 6.     (all labs ordered are listed, but only abnormal results are displayed) Labs Reviewed  BASIC METABOLIC PANEL WITH GFR - Abnormal; Notable for the following components:      Result Value   Sodium 132 (*)    Chloride 96 (*)    Glucose, Bld 211 (*)    All other components within normal limits  BRAIN NATRIURETIC PEPTIDE - Abnormal; Notable for the following components:   B Natriuretic Peptide 298.0 (*)    All other components within normal limits  URINALYSIS, ROUTINE W REFLEX MICROSCOPIC - Abnormal; Notable for the following components:   Color, Urine COLORLESS (*)    Specific Gravity, Urine 1.004 (*)    Glucose, UA 50 (*)    Protein, ur 30 (*)    All other components within normal limits  CBG MONITORING, ED - Abnormal; Notable for the following components:   Glucose-Capillary 228 (*)    All other components within normal limits  TROPONIN I (HIGH SENSITIVITY) - Abnormal; Notable for the following components:   Troponin I (High Sensitivity) 560 (*)    All other components within normal limits  TROPONIN I (HIGH SENSITIVITY) - Abnormal; Notable for the following components:   Troponin I (High Sensitivity) 666 (*)    All other components within normal limits  TROPONIN I (HIGH SENSITIVITY) - Abnormal; Notable for the following components:   Troponin I  (High Sensitivity) 805 (*)    All other components within normal limits  CULTURE, BLOOD (ROUTINE X 2)  CULTURE, BLOOD (ROUTINE X 2)  MRSA NEXT GEN BY PCR, NASAL  MAGNESIUM   CBC  LACTIC ACID, PLASMA  MAGNESIUM   TROPONIN I (HIGH SENSITIVITY)    EKG: EKG Interpretation Date/Time:  Friday November 06 2023 22:34:43 EDT Ventricular Rate:  69 PR Interval:  170 QRS Duration:  103 QT Interval:  461 QTC Calculation: 494 R Axis:   -11  Text Interpretation: Sinus rhythm Ventricular premature complex Probable left atrial enlargement LVH with secondary repolarization abnormality Borderline prolonged QT interval When compared with ECG of EARLIER SAME DATE Premature ventricular complexes are now present Confirmed by Raford Lenis ([id number]) on 11/06/2023 11:03:42 PM  Radiology: [facility] Chest Portable 1 View Result Date: 11/06/2023 CLINICAL DATA:  Shortness of breath EXAM: PORTABLE CHEST 1 VIEW COMPARISON:  Chest x-ray 08/15/2023 FINDINGS: Heart is enlarged. Patient is status post cardiac surgery. There central pulmonary vascular congestion. There is no focal lung infiltrate, pleural effusion or pneumothorax. No acute fractures are seen. IMPRESSION: Cardiomegaly with central pulmonary vascular congestion. Electronically Signed   By: Greig Pique M.D.   On: 11/06/2023 19:44     .Critical Care  Performed by: Towana Ozell BROCKS, MD Authorized by: Towana Ozell BROCKS, MD   Critical care provider statement:    Critical care time (minutes):  45   Critical care time was exclusive of:  Separately billable procedures and treating other patients   Critical care was necessary to treat or prevent imminent or life-threatening deterioration of the following conditions:  Respiratory failure and cardiac failure   Critical care was time spent personally by me on the following activities:  Development of treatment plan with patient or surrogate, discussions with consultants, evaluation of patient's response to treatment,  examination of patient, obtaining history from patient or surrogate, ordering and performing  treatments and interventions, ordering and review of laboratory studies, ordering and review of radiographic studies, pulse oximetry, re-evaluation of patient's condition and review of old charts   I assumed direction of critical care for this patient from another provider in my specialty: no      Medications Ordered in the ED  nitroGLYCERIN  50 mg in dextrose  5 % 250 mL (0.2 mg/mL) infusion (23.333 mcg/min Intravenous Infusion Verify 11/07/23 0400)  aspirin  EC tablet 81 mg (81 mg Oral Given 11/07/23 0858)  carvedilol  (COREG ) tablet 25 mg (25 mg Oral Given 11/07/23 0858)  rosuvastatin  (CRESTOR ) tablet 40 mg (40 mg Oral Given 11/07/23 0900)  insulin  glargine (LANTUS ) injection 20 Units (20 Units Subcutaneous Given 11/07/23 0136)  insulin  aspart (novoLOG ) injection 15 Units (15 Units Subcutaneous Given 11/07/23 0900)  clopidogrel  (PLAVIX ) tablet 75 mg (75 mg Oral Given 11/07/23 0900)  albuterol  (PROVENTIL ) (2.5 MG/3ML) 0.083% nebulizer solution 3 mL (has no administration in time range)  montelukast  (SINGULAIR ) tablet 10 mg (10 mg Oral Given 11/07/23 0858)  furosemide  (LASIX ) injection 60 mg (60 mg Intravenous Given 11/07/23 0857)  potassium chloride  SA (KLOR-CON  M) CR tablet 40 mEq (40 mEq Oral Given 11/07/23 0900)  enoxaparin  (LOVENOX ) injection 40 mg (40 mg Subcutaneous Given 11/07/23 0904)  ondansetron  (ZOFRAN ) tablet 4 mg (has no administration in time range)    Or  ondansetron  (ZOFRAN ) injection 4 mg (has no administration in time range)  polyethylene glycol (MIRALAX  / GLYCOLAX ) packet 17 g (has no administration in time range)  losartan  (COZAAR ) tablet 100 mg (has no administration in time range)  losartan  (COZAAR ) tablet 50 mg (has no administration in time range)  ipratropium (ATROVENT ) 0.02 % nebulizer solution (1 mg  Given 11/06/23 1925)  albuterol  (PROVENTIL ) (2.5 MG/3ML) 0.083% nebulizer solution (10 mg   Given 11/06/23 1925)  furosemide  (LASIX ) injection 40 mg (40 mg Intravenous Given 11/06/23 2110)  potassium chloride  10 mEq in 100 mL IVPB (0 mEq Intravenous Stopping previously hung infusion 11/07/23 0212)    Clinical Course as of 11/07/23 0928  Fri Nov 06, 2023  1946 X-ray interpreted by me as cardiomegaly with interstitial edema.  Awaiting radiology reading. [MB]  2042 Discussed with cardiology Dr. Duffy.  She said her cath 6 months ago was fairly clean.  She is recommending diuresis and blood pressure control, admit to [facility] and cardiology will consult. [MB]  2111 Discussed with Dr. Barbra [facility] hospitalist who will evaluate patient for admission. [MB]    Clinical Course User Index [MB] Towana Ozell BROCKS, MD                                 Medical Decision Making Amount and/or Complexity of Data Reviewed Labs: ordered. Radiology: ordered.  Risk Prescription drug management. Decision regarding hospitalization.   This patient complains of severe shortness of breath; this involves an extensive number of treatment Options and is a complaint that carries with it a high risk of complications and morbidity. The differential includes CHF, COPD, pneumothorax, pneumonia, PE, hypertensive emergency  I ordered, reviewed and interpreted labs, which included CBC normal, chemistries with elevated glucose, troponins elevated and rising, BNP elevated, blood culture sent I ordered medication IV Lasix  and nitroglycerin , IV potassium, breathing treatments and reviewed PMP when indicated. I ordered imaging studies which included chest x-ray and I independently    visualized and interpreted imaging which showed cardiomegaly and interstitial edema Additional history obtained from EMS and patient's  family members Previous records obtained and reviewed in epic including recent cardiology notes I consulted cardiology Dr. Duffy and [facility] hospitalist Dr. Barbra and discussed lab and imaging findings and  discussed disposition.  Cardiac monitoring reviewed, normal sinus rhythm Social determinants considered, no significant barriers Critical Interventions: Initiation of BiPAP and diuresis and blood pressure control for fluid overload  After the interventions stated above, I reevaluated the patient and found patient to be much more comfortable in her breathing and able to speak in full sentences Admission and further testing considered, she would benefit from mission to the hospital for continued diuresis and further cardiac evaluation.      Final diagnoses:  Acute on chronic diastolic heart failure (HCC)  Hypertensive urgency  Acute respiratory failure with hypoxia [facility])    ED Discharge Orders     None          Towana Ozell BROCKS, MD 11/07/23 [phone]

## 2023-11-06 NOTE — Progress Notes (Signed)
 Rt removed patient from BIPAP and placed on 3LPM [place]. Patient stated she was breathing better and comfortable.

## 2023-11-06 NOTE — H&P (Signed)
 History and Physical    Patient: Leah Ferrell FMW:[id number] DOB: Aug 27, 1950 DOA: 11/06/2023 DOS: the patient was seen and examined on 11/06/2023 PCP: Lavell Bari LABOR, FNP  Patient coming from: Home  Chief Complaint:  Chief Complaint  Patient presents with   Respiratory Distress   HPI: Leah Ferrell is a 73 y.o. female with medical history significant of diabetes, hypertension, heart failure with preserved EF with grade 2 diastolic dysfunction, coronary artery disease status post CABG, diabetes, hypertension.  Patient comes in with severe range blood pressures, shortness of breath with dyspnea on exertion.  Oxygen saturation was 89%.  She was placed on CPAP and given nitroglycerin  and route 2 blood pressures in the 200s.  Here, she continued on CPAP and a nitro drip was started due to her continued systolic blood pressures in the 200 range..  Troponins were drawn and shown to be in the 500 range.  Her normal troponin level is around 19.  Denies fevers, chills, nausea, vomiting.  Denies chest pain.  Cardiologist on-call recommended cycling troponins and if they continue to be elevated, then to start heparin  drip.  She will be admitted to [facility] and cardiology will consult.  Review of Systems: As mentioned in the history of present illness. All other systems reviewed and are negative. Past Medical History:  Diagnosis Date   CAD (coronary artery disease)    a. 04/2012 NSTEMI: s/p DES to LAD.   Chronic diastolic CHF (congestive heart failure) (HCC)    Diabetes mellitus without complication (HCC)    Dysrhythmia    Environmental and seasonal allergies    Hyperlipidemia    Hypertension    Leukocytosis    Morbid obesity (HCC)    NSTEMI (non-ST elevated myocardial infarction) (HCC) 04/27/2012   PONV (postoperative nausea and vomiting)    PVD (peripheral vascular disease) (HCC)    a. 04/2012 ABI: R 0.49, L 0.39. Angiography 06/14: Significant ostial left common iliac artery  stenosis extending into the distal aorta, severe left common femoral artery stenosis, bilateral SFA occlusion with heavy calcifications. Functionally, one-vessel runoff bilaterally below the knee   Renal artery stenosis (HCC)    s/p angioplasty/stenting bilateral renal arteries 03/16/19   Shortness of breath    Past Surgical History:  Procedure Laterality Date   ABDOMINAL AORTAGRAM N/A 07/21/2012   Procedure: ABDOMINAL EZELLA;  Surgeon: Deatrice LABOR Cage, MD;  Location: [facility] CATH LAB;  Service: Cardiovascular;  Laterality: N/A;   ABDOMINAL AORTOGRAM W/LOWER EXTREMITY N/A 05/09/2020   Procedure: ABDOMINAL AORTOGRAM W/LOWER EXTREMITY;  Surgeon: Cage Deatrice LABOR, MD;  Location: [facility] INVASIVE CV LAB;  Service: Cardiovascular;  Laterality: N/A;   CARDIAC CATHETERIZATION     stents X 2   CATARACT EXTRACTION W/PHACO Left 09/08/2022   Procedure: CATARACT EXTRACTION PHACO AND INTRAOCULAR LENS PLACEMENT (IOC) with placement of Corticosteroid;  Surgeon: Harrie Agent, MD;  Location: [facility] ORS;  Service: Ophthalmology;  Laterality: Left;  CDE 8.48   CORONARY ANGIOGRAM  04/27/2012   Procedure: CORONARY ANGIOGRAM;  Surgeon: Aleene JINNY Passe, MD;  Location: [facility] CATH LAB;  Service: Cardiovascular;;   CORONARY ARTERY BYPASS GRAFT N/A 06/25/2020   Procedure: CORONARY ARTERY BYPASS GRAFTING (CABG)X3. LEFT INTERNAL MAMMARY ARTERY. RIGHT ENDOSCOPIC SAPHENOUS VEIN HARVESTING.;  Surgeon: German Bartlett PEDLAR, MD;  Location: [facility] OR;  Service: Open Heart Surgery;  Laterality: N/A;   CORONARY STENT INTERVENTION N/A 04/08/2023   Procedure: CORONARY STENT INTERVENTION;  Surgeon: Cage Deatrice LABOR, MD;  [facility]: [facility] INVAS[facility] CV LAB;  Service: Cardiovascular;  Laterality: N/A;  RCA   ENDARTERECTOMY FEMORAL Left 11/02/2013   Procedure: RESECTION LEFT COMMON FEMORAL ARTERY AND INSERTION OF INTERPOSITION 7mm HEMASHIELD GRAFT FROM LEFT EXTERNAL  ILIAC ARTERY TO LEFT PROFUNDA  FEMORIS ARTERY;  Surgeon: Lynwood JONETTA Collum, MD;  Location: [facility] OR;   Service: Vascular;  Laterality: Left;   EYE SURGERY Right 2017   FRACTURE SURGERY Right Jul 07, 2014   Right [place]-  [place]  by Heat duct   ILIAC ARTERY STENT Left 08/31/2013   LAD stent     LEFT HEART CATH AND CORONARY ANGIOGRAPHY N/A 05/09/2020   Procedure: LEFT HEART CATH AND CORONARY ANGIOGRAPHY;  Surgeon: Darron Deatrice LABOR, MD;  [facility];  Service: Cardiovascular;  Laterality: N/A;   LEFT HEART CATH AND CORS/GRAFTS ANGIOGRAPHY N/A 04/08/2023   Procedure: LEFT HEART CATH AND CORS/GRAFTS ANGIOGRAPHY;  Surgeon: Darron Deatrice LABOR, MD;  [facility];  Service: Cardiovascular;  Laterality: N/A;   LEFT HEART CATHETERIZATION WITH CORONARY ANGIOGRAM N/A 04/23/2012   Procedure: LEFT HEART CATHETERIZATION WITH CORONARY ANGIOGRAM;  Surgeon: Peter M Swaziland, MD;  Location: [facility] CATH LAB;  Service: Cardiovascular;  Laterality: N/A;   LOWER EXTREMITY ANGIOGRAM Left 08/31/2013   Procedure: LOWER EXTREMITY ANGIOGRAM;  Surgeon: Deatrice LABOR Darron, MD;  [facility]: [facility] CATH [facility];  Service: Cardiovascular;  Laterality: Left;   PERCUTANEOUS STENT INTERVENTION Left 08/31/2013   Procedure: PERCUTANEOUS STENT INTERVENTION;  Surgeon: Deatrice LABOR Darron, MD;  [facility]: [facility] CATH [facility];  Service: Cardiovascular;  Laterality: Left;  Common Iliac artery   PERIPHERAL VASCULAR CATHETERIZATION N/A 03/19/2016   Procedure: Abdominal Aortogram w/Lower Extremity;  Surgeon: Deatrice LABOR Darron, MD;  [facility]: [facility];  Service: Cardiovascular;  Laterality: N/A;   PERIPHERAL VASCULAR CATHETERIZATION  03/19/2016   Procedure: Peripheral Vascular Balloon Angioplasty-CFA Left;  Surgeon: Deatrice LABOR Darron, MD;  [facility]: [facility];  Service: Cardiovascular;;   PERIPHERAL VASCULAR INTERVENTION Bilateral 03/16/2019   Procedure: PERIPHERAL VASCULAR INTERVENTION;  Surgeon: Darron Deatrice LABOR, MD;  [facility];  Service: Cardiovascular;  Laterality: Bilateral;  RENAL   PERIPHERAL  VASCULAR INTERVENTION Bilateral 05/09/2020   Procedure: PERIPHERAL VASCULAR INTERVENTION;  Surgeon: Darron Deatrice LABOR, MD;  [facility];  Service: Cardiovascular;  Laterality: Bilateral;  Iliac   RENAL ANGIOGRAPHY  03/16/2019   RENAL ANGIOGRAPHY Bilateral 03/16/2019   Procedure: RENAL ANGIOGRAPHY;  Surgeon: Darron Deatrice LABOR, MD;  [facility];  Service: Cardiovascular;  Laterality: Bilateral;   RENAL ANGIOGRAPHY Bilateral 04/08/2023   Procedure: RENAL ANGIOGRAPHY;  Surgeon: Darron Deatrice LABOR, MD;  [facility];  Service: Cardiovascular;  Laterality: Bilateral;   TEE WITHOUT CARDIOVERSION N/A 06/25/2020   Procedure: TRANSESOPHAGEAL ECHOCARDIOGRAM (TEE);  Surgeon: German Bartlett PEDLAR, MD;  Location: [facility] OR;  Service: Open Heart Surgery;  Laterality: N/A;   TUMOR REMOVAL     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 Her smoking use included cigarettes. She started smoking about 59 years ago. She has a 36.8 pack-year smoking history. She has never used smokeless tobacco. She reports that she does not drink alcohol  and does not use drugs.  Allergies  Allergen Reactions   Tape Rash    Family History  Problem Relation Age of Onset   Diabetes Mother    Hyperlipidemia Mother    Hypertension Mother    Peripheral vascular disease Mother  Other Sister        drowned   Other Sister        drowned   Diabetes Daughter        borderline   Hypertension Daughter    Diabetes Maternal Grandmother    Heart attack Maternal Grandfather    Heart disease Brother    Diabetes Brother    Alcohol  abuse Brother    Diabetes Son    Hypertension Son    Hyperlipidemia Son    Breast cancer Neg Hx     Prior to Admission medications   Medication Sig Start Date End Date Taking? Authorizing Provider  acetaminophen  (TYLENOL ) 325 MG tablet Take 2 tablets (650 mg total) by mouth every 6 (six) hours as needed for mild  pain (pain score 1-3) (or Fever >/= 101). 06/08/23   Emokpae, Courage, MD  albuterol  (VENTOLIN  HFA) 108 (90 Base) MCG/ACT inhaler Inhale 2 puffs into the lungs every 6 (six) hours as needed for shortness of breath or wheezing. 06/08/23   Pearlean Manus, MD  Ascorbic Acid  (VITAMIN C) 1000 MG tablet Take 1,000 mg by mouth 3 (three) times daily.     [provider]  aspirin  EC 81 MG tablet Take 1 tablet (81 mg total) by mouth daily with breakfast. In the morning 06/08/23   Pearlean Manus, MD  BD ULTRA-FINE PEN NEEDLES 29G X 12.7MM MISC USE PENNEEDLES 3 TIMES  DAILY AS DIRECTED 06/17/18   Lavell Lye A, FNP  bismuth  subsalicylate (PEPTO BISMOL) 262 MG/15ML suspension Take 30 mLs by mouth every 6 (six) hours as needed for indigestion or diarrhea or loose stools.    [provider]  blood glucose meter kit and supplies Dispense based on patient and insurance preference. Use up to four times daily as directed. (FOR ICD-10 E10.9, E11.9). 12/19/19   Lavell Lye LABOR, FNP  calcium  carbonate (OSCAL) 1500 (600 Ca) MG TABS tablet Take 600 mg of elemental calcium  by mouth 3 (three) times daily with meals.    [provider]  Calcium  Carbonate-Vit D-Min (CALTRATE 600+D PLUS MINERALS) 600-800 MG-UNIT TABS Take 1 tablet by mouth 3 (three) times daily.     [provider]  carvedilol  (COREG ) 25 MG tablet Take 1 tablet (25 mg total) by mouth 2 (two) times daily with a meal. 06/08/23 09/07/23  Pearlean Manus, MD  Chlorpheniramine-Acetaminophen  (CORICIDIN HBP COLD/FLU PO) Take 1 tablet by mouth daily as needed (cough/cold).    [provider]  Cholecalciferol  (VITAMIN D -3) 125 MCG (5000 UT) TABS Take 5,000 Units by mouth daily.    [provider]  clopidogrel  (PLAVIX ) 75 MG tablet Take 1 tablet (75 mg total) by mouth daily. 06/08/23   Pearlean Manus, MD  fish oil-omega-3 fatty acids  1000 MG capsule Take 1 g by mouth daily.     [provider]  furosemide   (LASIX ) 40 MG tablet Take 1 tablet (40 mg total) by mouth daily. 10/16/23 10/15/24  Darron Deatrice LABOR, MD  glucose blood (GLUCOSE METER TEST) test strip Use 6-10 times a day, Uses [employer] 12/19/19   Lavell Lye A, FNP  hydroxypropyl methylcellulose (ISOPTO TEARS) 2.5 % ophthalmic solution Place 1 drop into both eyes 3 (three) times daily as needed (for dry eyes).    [provider]  insulin  degludec (TRESIBA  FLEXTOUCH) 100 UNIT/ML FlexTouch Pen Inject 20 Units into the skin at bedtime. 08/19/23   Ricky Fines, MD  insulin  lispro (HUMALOG  KWIKPEN) 200 UNIT/ML KwikPen Inject 15-30 Units into the skin 3 (three)  times daily before meals. 08/19/23   Ricky Fines, MD  Insulin  Syringe 27G X 1/2 0.5 ML MISC Use with Fiasp  insulin  TID 04/02/20   Lavell Bari LABOR, FNP  losartan  (COZAAR ) 50 MG tablet Take 1 tablet (50 mg total) by mouth daily. 08/20/23   Ricky Fines, MD  montelukast  (SINGULAIR ) 10 MG tablet Take 1 tablet by mouth once daily with breakfast 05/05/23   Lavell Bari A, FNP  nitroGLYCERIN  (NITROSTAT ) 0.4 MG SL tablet Place 1 tablet (0.4 mg total) under the tongue every 5 (five) minutes as needed for chest pain. 04/10/23   Arlice Reichert, MD  oxybutynin  (DITROPAN -XL) 5 MG 24 hr tablet Take 1 tablet (5 mg total) by mouth at bedtime. 05/21/23   Lavell Bari A, FNP  polyethylene glycol (MIRALAX  / GLYCOLAX ) 17 g packet Take 17 g by mouth daily as needed. 08/19/23   Ricky Fines, MD  potassium chloride  SA (KLOR-CON  M) 20 MEQ tablet Take 1 tablet (20 mEq total) by mouth daily. Patient taking differently: Take 20 mEq by mouth daily. Patient not currently taking until recheck BMP 08/19/23   Ricky Fines, MD  RELION PEN NEEDLES 29G X MISC USE 1 NEEDLE TO INJECT INSULIN  SUBCUTANEOUSLY 3 TIMES DAILY 01/31/16   Lavell Bari A, FNP  rosuvastatin  (CRESTOR ) 40 MG tablet Take 1 tablet by mouth once daily 09/18/23   Lavell Bari A, FNP  Semaglutide , 2 MG/DOSE, (OZEMPIC , 2 MG/DOSE,) 8 MG/3ML SOPN Inject  2 mg into the skin once a week. 09/09/22   Lavell Bari LABOR, FNP    Physical Exam: Vitals:   11/06/23 2030 11/06/23 2032 11/06/23 2045 11/06/23 2130  BP: (!) 209/69   (!) 191/76  Pulse: 64  67 73  Resp: (!) 21  (!) 22 18  Temp:  97.9 F (36.6 C)    TempSrc:  Temporal    SpO2: 100%  100% 97%  Height:       General: Elderly female. Awake and alert and oriented x3. No acute cardiopulmonary distress.  HEENT: Normocephalic atraumatic.  Right and left ears normal in appearance.  Pupils equal, round, reactive to light. Extraocular muscles are intact. Sclerae anicteric and noninjected.  Moist mucosal membranes. No mucosal lesions.  Neck: Neck supple without lymphadenopathy. No carotid bruits. No masses palpated.  Cardiovascular: Regular rate with normal S1-S2 sounds. No murmurs, rubs, gallops auscultated.  Increased JVD.  Respiratory: Rales in bases to midlung field bilaterally.  No accessory muscle use. Abdomen: Soft, nontender, nondistended. Active bowel sounds. No masses or hepatosplenomegaly  Skin: No rashes, lesions, or ulcerations.  Dry, warm to touch. 2+ dorsalis pedis and radial pulses. Musculoskeletal: No calf or leg pain. All major joints not erythematous nontender.  No upper or lower joint deformation.  Good ROM.  No contractures  Psychiatric: Intact judgment and insight. Pleasant and cooperative. Neurologic: No focal neurological deficits. Strength is 5/5 and symmetric in upper and lower extremities.  Cranial nerves II through XII are grossly intact.  Data Reviewed: I have reviewed the imaging and labs for this patient  Assessment and Plan: No notes have been filed under this hospital service. Service: Hospitalist  Principal Problem:   Acute respiratory failure with hypoxia (HCC) Active Problems:   Essential hypertension   Diabetes mellitus with peripheral vascular disease (HCC)   CAD (coronary artery disease)   Acute on chronic diastolic CHF (congestive heart failure)  (HCC)   Hypertensive urgency   Acute respiratory failure with hypoxia On BiPAP.  Will try to wean Acute heart  failure with preserved EF and grade 2 diastolic dysfunction Telemetry monitoring Strict I/O Daily Weights Diuresis: 60 mg twice daily of Lasix  Potassium: 40 mEq twice a day by mouth Echo cardiac exam tomorrow Repeat BMP tomorrow On nitro drip Elevated troponins Question whether this is NSTEMI secondary to heart failure Cycle troponins Start heparin  if trending up Hypertensive urgency with chronic hypertension On nitro drip Diabetes Continue home regimen with sliding scale   Advance Care Planning:   Code Status: Full Code confirmed by patient  Consults: Cardiology  Family Communication: Daughter present during interview and exam  Severity of Illness: The appropriate patient status for this patient is INPATIENT. Inpatient status is judged to be reasonable and necessary in order to provide the required intensity of service to ensure the patient's safety. The patient's presenting symptoms, physical exam findings, and initial radiographic and laboratory data in the context of their chronic comorbidities is felt to place them at high risk for further clinical deterioration. Furthermore, it is not anticipated that the patient will be medically stable for discharge from the hospital within 2 midnights of admission.   * I certify that at the point of admission it is my clinical judgment that the patient will require inpatient hospital care spanning beyond 2 midnights from the point of admission due to high intensity of service, high risk for further deterioration and high frequency of surveillance required.*  Author: Mendi Constable J Halsey Persaud, DO 11/06/2023 9:38 PM  For on call review [email or web address].

## 2023-11-07 ENCOUNTER — Inpatient Hospital Stay (HOSPITAL_COMMUNITY)

## 2023-11-07 DIAGNOSIS — I5033 Acute on chronic diastolic (congestive) heart failure: Secondary | ICD-10-CM

## 2023-11-07 DIAGNOSIS — J9601 Acute respiratory failure with hypoxia: Secondary | ICD-10-CM | POA: Diagnosis not present

## 2023-11-07 LAB — GLUCOSE, CAPILLARY
Glucose-Capillary: 199 mg/dL — ABNORMAL HIGH (ref 70–99)
Glucose-Capillary: 176 mg/dL — ABNORMAL HIGH (ref 70–99)
Glucose-Capillary: 245 mg/dL — ABNORMAL HIGH (ref 70–99)
Glucose-Capillary: 219 mg/dL — ABNORMAL HIGH (ref 70–99)

## 2023-11-07 LAB — MAGNESIUM: Magnesium: 1.7 mg/dL (ref 1.7–2.4)

## 2023-11-07 LAB — TROPONIN I (HIGH SENSITIVITY)
Troponin I (High Sensitivity): 828 ng/L (ref <18)
Troponin I (High Sensitivity): 700 ng/L (ref <18)
Troponin I (High Sensitivity): 735 ng/L (ref <18)

## 2023-11-07 LAB — BASIC METABOLIC PANEL WITH GFR
Anion gap: 13 (ref 5–15)
BUN: 7 mg/dL — ABNORMAL LOW (ref 8–23)
CO2: 27 mmol/L (ref 22–32)
Calcium: 9.4 mg/dL (ref 8.9–10.3)
Chloride: 97 mmol/L — ABNORMAL LOW (ref 98–111)
Creatinine, Ser: 0.79 mg/dL (ref 0.44–1.00)
GFR, Estimated: >60 mL/min (ref >60)
Glucose, Bld: 253 mg/dL — ABNORMAL HIGH (ref 70–99)
Potassium: 3.8 mmol/L (ref 3.5–5.1)
Sodium: 137 mmol/L (ref 135–145)

## 2023-11-07 LAB — ECHOCARDIOGRAM COMPLETE
Area-P 1/2: 4.41 cm2
Calc EF: 59.7 %
Height: 62 in
S' Lateral: 3.1 cm
Single Plane A2C EF: 58.2 %
Single Plane A4C EF: 61.8 %
Weight: 3005.31 [oz_av]

## 2023-11-07 LAB — CBC
HCT: 37.8 % (ref 36.0–46.0)
Hemoglobin: 12.7 g/dL (ref 12.0–15.0)
MCH: 31.3 pg (ref 26.0–34.0)
MCHC: 33.6 g/dL (ref 30.0–36.0)
MCV: 93.1 fL (ref 80.0–100.0)
Platelets: 207 K/uL (ref 150–400)
RBC: 4.06 MIL/uL (ref 3.87–5.11)
RDW: 15.4 % (ref 11.5–15.5)
WBC: 10.1 K/uL (ref 4.0–10.5)
nRBC: 0 % (ref 0.0–0.2)

## 2023-11-07 LAB — MRSA NEXT GEN BY PCR, NASAL: MRSA by PCR Next Gen: NOT DETECTED

## 2023-11-07 MED ORDER — LOSARTAN POTASSIUM 25 MG PO TABS
100.0000 mg | ORAL_TABLET | Freq: Every day | ORAL | Status: DC
  Administered 2023-11-08: 100 mg via ORAL
  Filled 2023-11-07: qty 4

## 2023-11-07 MED ORDER — LOSARTAN POTASSIUM 25 MG PO TABS
50.0000 mg | ORAL_TABLET | Freq: Once | ORAL | Status: AC
  Administered 2023-11-07: 50 mg via ORAL
  Filled 2023-11-07: qty 2

## 2023-11-07 MED ORDER — INSULIN ASPART 100 UNIT/ML IJ SOLN
0.0000 [IU] | Freq: Three times a day (TID) | INTRAMUSCULAR | Status: DC
  Administered 2023-11-07 (×2): 1 [IU] via SUBCUTANEOUS
  Administered 2023-11-08: 2 [IU] via SUBCUTANEOUS

## 2023-11-07 MED ORDER — INSULIN ASPART 100 UNIT/ML IJ SOLN
0.0000 [IU] | Freq: Every day | INTRAMUSCULAR | Status: DC
  Administered 2023-11-07: 2 [IU] via SUBCUTANEOUS

## 2023-11-07 NOTE — Progress Notes (Signed)
 PROGRESS NOTE    Leah Ferrell  FMW:[id number] DOB: [birth year] DOA: 11/06/2023 PCP: Lavell Bari LABOR, FNP   Brief Narrative:    Assessment & Plan:   Principal Problem:   Acute respiratory failure with hypoxia (HCC) Active Problems:   Essential hypertension   Diabetes mellitus with peripheral vascular disease (HCC)   CAD (coronary artery disease)   Acute on chronic diastolic CHF (congestive heart failure) (HCC)   Hypertensive urgency   Assessment and Plan:   Acute respiratory failure with hypoxia appears to be due to flash pulmonary edema in the setting of hypertensive emergency Will continue diuresis, patient is weaned off nasal cannula today.  2. Acute heart failure with preserved EF and grade 2 diastolic dysfunction Chest x-ray with pulmonary edema, diuresing well so far. Telemetry monitoring Strict I/O Daily Weights Diuresis: continue 60 mg twice daily of Lasix  Potassium: 40 mEq twice a day by mouth Echo pending Repeat BMP tomorrow off nitro drip Cardiology input appreciated Elevated troponins Troponin I 560, 666, 805.  Likely demand ischemia in the setting of uncontrolled blood pressure.  Pulmonary edema v type I non-STEMI Continue aspirin , Plavix , statins, Coreg   Await echo for wall motion abnormalities.   3.  Coronary artery disease history of CABG, recent PCI of native RCA.  4. hypertensive urgency with chronic hypertension Continue Coreg , losartan .  Cardiology has increased losartan  to 100 mg daily.  Patient continues to be on nitro drip, with plan to wean down.  Has history of renovascular hypertension with a stent.   5. diabetes Continue home regimen with sliding scale  6.  Hyponatremia: Sodium 132, monitor with diuresis.    Advance Care Planning:   Code Status: Full Code confirmed by patient   Consults: Cardiology   Family Communication:   Severity of Illness: The appropriate patient status for this patient is INPATIENT. Inpatient status is  judged to be reasonable and necessary in order to provide the required intensity of service to ensure the patient's safety. The patient's presenting symptoms, physical exam findings, and initial radiographic and laboratory data in the context of their chronic comorbidities is felt to place them at high risk for further clinical deterioration. Furthermore, it is not anticipated that the patient will be medically stable for discharge from the hospital within 2 midnights of admission.    * I certify that at the point of admission it is my clinical judgment that the patient will require inpatient hospital care spanning beyond 2 midnights from the point of admission due to high intensity of service, high risk for further deterioration and high frequency of surveillance required.*     Subjective:  Patient sitting on edge of the bed not in any acute distress.  She is weaned off supplemental oxygen today.  Blood pressure is still significantly elevated and IV nitroglycerin  running.  Patient denies chest pain or shortness of breath.  Feels improved. Objective: Vitals:   11/07/23 0130 11/07/23 0145 11/07/23 0344 11/07/23 0345  BP: (!) 185/65 (!) 185/64 (!) 157/53 (!) 157/53  Pulse: 66 71 66 (!) 56  Resp: 16 20 17 19   Temp:   98.4 F (36.9 C)   TempSrc:   Oral   SpO2: 99% 98% 97% 97%  Weight:   85.2 kg   Height:   5' 2 (1.575 m)     Intake/Output Summary (Last 24 hours) at 11/07/2023 0735 Last data filed at 11/07/2023 0600 Gross per 24 hour  Intake 561.6 ml  Output 2520 ml  Net -1958.4 ml  Filed Weights   11/07/23 0344  Weight: 85.2 kg    Examination:  General exam: Appears calm and comfortable  Respiratory system: Bilateral decreased breath sounds at bases Cardiovascular system: S1 & S2 heard, Rate controlled Gastrointestinal system: Abdomen is nondistended, soft and nontender. Normal bowel sounds heard. Extremities: No cyanosis, clubbing, edema  Central nervous system: Alert and  oriented. No focal neurological deficits. Moving extremities Skin: No rashes, lesions or ulcers Psychiatry: Judgement and insight appear normal. Mood & affect appropriate.     Data Reviewed: I have personally reviewed following labs and imaging studies  CBC: Recent Labs  Lab 11/06/23 1913  WBC 9.6  HGB 13.2  HCT 40.7  MCV 95.5  PLT 212   Basic Metabolic Panel: Recent Labs  Lab 11/06/23 1913  NA 132*  K 3.8  CL 96*  CO2 26  GLUCOSE 211*  BUN 12  CREATININE 0.80  CALCIUM  8.9  MG 2.0   GFR: Estimated Creatinine Clearance: 64.3 mL/min (by C-G formula based on SCr of 0.8 mg/dL). Liver Function Tests: No results for input(s): AST, ALT, ALKPHOS, BILITOT, PROT, ALBUMIN  in the last 168 hours. No results for input(s): LIPASE, AMYLASE in the last 168 hours. No results for input(s): AMMONIA in the last 168 hours. Coagulation Profile: No results for input(s): INR, PROTIME in the last 168 hours. Cardiac Enzymes: No results for input(s): CKTOTAL, CKMB, CKMBINDEX, TROPONINI in the last 168 hours. BNP (last 3 results) No results for input(s): PROBNP in the last 8760 hours. HbA1C: No results for input(s): HGBA1C in the last 72 hours. CBG: Recent Labs  Lab 11/06/23 1919  GLUCAP 228*   Lipid Profile: No results for input(s): CHOL, HDL, LDLCALC, TRIG, CHOLHDL, LDLDIRECT in the last 72 hours. Thyroid  Function Tests: No results for input(s): TSH, T4TOTAL, FREET4, T3FREE, THYROIDAB in the last 72 hours. Anemia Panel: No results for input(s): VITAMINB12, FOLATE, FERRITIN, TIBC, IRON, RETICCTPCT in the last 72 hours. Sepsis Labs: Recent Labs  Lab 11/06/23 1914  LATICACIDVEN 1.7    Recent Results (from the past 240 hours)  Culture, blood (routine x 2)     Status: None (Preliminary result)   Collection Time: 11/06/23  7:14 PM   Specimen: BLOOD  Result Value Ref Range Status   Specimen Description BLOOD BLOOD  LEFT WRIST  Final   Special Requests   Final    BOTTLES DRAWN AEROBIC AND ANAEROBIC Blood Culture adequate volume Performed at [facility], [address], [place], [place] [address]    Culture PENDING  Incomplete   Report Status PENDING  Incomplete  Culture, blood (routine x 2)     Status: None (Preliminary result)   Collection Time: 11/06/23  7:37 PM   Specimen: BLOOD  Result Value Ref Range Status   Specimen Description BLOOD LEFT ANTECUBITAL  Final   Special Requests   Final    BOTTLES DRAWN AEROBIC AND ANAEROBIC Blood Culture adequate volume Performed at [facility], [address], [place], [place] [address]    Culture PENDING  Incomplete   Report Status PENDING  Incomplete  MRSA Next Gen by PCR, Nasal     Status: None   Collection Time: 11/07/23  3:43 AM   Specimen: Nasal Mucosa; Nasal Swab  Result Value Ref Range Status   MRSA by PCR Next Gen NOT DETECTED NOT DETECTED Final    Comment: (NOTE) The GeneXpert MRSA Assay (FDA approved for NASAL specimens only), is one component of a comprehensive MRSA colonization surveillance program. It is not  intended to diagnose MRSA infection nor to guide or monitor treatment for MRSA infections. Test performance is not FDA approved in patients less than 51 years old. Performed at [facility], [address] [place], [place] [address]          Radiology Studies: DG Chest Portable 1 View Result Date: 11/06/2023 CLINICAL DATA:  Shortness of breath EXAM: PORTABLE CHEST 1 VIEW COMPARISON:  Chest x-ray 08/15/2023 FINDINGS: Heart is enlarged. Patient is status post cardiac surgery. There central pulmonary vascular congestion. There is no focal lung infiltrate, pleural effusion or pneumothorax. No acute fractures are seen. IMPRESSION: Cardiomegaly with central pulmonary vascular congestion. Electronically Signed   By: Greig Pique M.D.   On: 11/06/2023 19:44        Scheduled Meds:  aspirin  EC  81 mg Oral Q breakfast    carvedilol   25 mg Oral BID WC   clopidogrel   75 mg Oral Daily   enoxaparin  (LOVENOX ) injection  40 mg Subcutaneous Q24H   furosemide   60 mg Intravenous BID   insulin  aspart  15 Units Subcutaneous TID AC   insulin  glargine  20 Units Subcutaneous QHS   losartan   50 mg Oral Daily   montelukast   10 mg Oral Q breakfast   potassium chloride   40 mEq Oral BID   rosuvastatin   40 mg Oral Daily   Continuous Infusions:  nitroGLYCERIN  23.333 mcg/min (11/07/23 0400)          Derryl Duval, MD [facility] 11/07/2023, 7:35 AM

## 2023-11-07 NOTE — Plan of Care (Signed)
   Problem: Health Behavior/Discharge Planning: Goal: Ability to manage health-related needs will improve Outcome: Progressing   Problem: Clinical Measurements: Goal: Ability to maintain clinical measurements within normal limits will improve Outcome: Progressing   Problem: Clinical Measurements: Goal: Will remain free from infection Outcome: Progressing

## 2023-11-07 NOTE — ED Notes (Signed)
 AC to bring Lantus  from main pharmacy

## 2023-11-08 ENCOUNTER — Other Ambulatory Visit (HOSPITAL_COMMUNITY): Payer: Self-pay

## 2023-11-08 DIAGNOSIS — I161 Hypertensive emergency: Secondary | ICD-10-CM | POA: Diagnosis not present

## 2023-11-08 DIAGNOSIS — I2510 Atherosclerotic heart disease of native coronary artery without angina pectoris: Secondary | ICD-10-CM | POA: Diagnosis not present

## 2023-11-08 DIAGNOSIS — J9601 Acute respiratory failure with hypoxia: Secondary | ICD-10-CM | POA: Diagnosis not present

## 2023-11-08 LAB — BASIC METABOLIC PANEL WITH GFR
Anion gap: 10 (ref 5–15)
BUN: 17 mg/dL (ref 8–23)
CO2: 28 mmol/L (ref 22–32)
Calcium: 9.2 mg/dL (ref 8.9–10.3)
Chloride: 94 mmol/L — ABNORMAL LOW (ref 98–111)
Creatinine, Ser: 1.17 mg/dL — ABNORMAL HIGH (ref 0.44–1.00)
GFR, Estimated: 50 mL/min — ABNORMAL LOW (ref >60)
Glucose, Bld: 256 mg/dL — ABNORMAL HIGH (ref 70–99)
Potassium: 4 mmol/L (ref 3.5–5.1)
Sodium: 132 mmol/L — ABNORMAL LOW (ref 135–145)

## 2023-11-08 LAB — GLUCOSE, CAPILLARY
Glucose-Capillary: 136 mg/dL — ABNORMAL HIGH (ref 70–99)
Glucose-Capillary: 216 mg/dL — ABNORMAL HIGH (ref 70–99)

## 2023-11-08 LAB — MAGNESIUM: Magnesium: 1.7 mg/dL (ref 1.7–2.4)

## 2023-11-08 LAB — TROPONIN I (HIGH SENSITIVITY): Troponin I (High Sensitivity): 481 ng/L (ref <18)

## 2023-11-08 MED ORDER — LOSARTAN POTASSIUM 100 MG PO TABS
100.0000 mg | ORAL_TABLET | Freq: Every day | ORAL | 0 refills | Status: DC
  Filled 2023-11-08: qty 30, 30d supply, fill #0

## 2023-11-08 NOTE — Discharge Summary (Signed)
 Physician Discharge Summary  MURDIS FLITTON FMW:[id number] DOB: [birth year] DOA: 11/06/2023  PCP: Lavell Bari LABOR, FNP  Admit date: 11/06/2023 Discharge date: 11/08/2023  Admitted From: Home Disposition: Home  Recommendations for Outpatient Follow-up:  Follow up with PCP in 1 week with repeat CBC/BMP Follow up in ED if symptoms worsen or new appear Follow-up with cardiology as already scheduled next month   Home Health: No Equipment/Devices: None  Discharge Condition: Stable CODE STATUS: Full Diet recommendation: Heart healthy  Brief/Interim Summary:   Leah Ferrell is a 73 y.o. female with medical history significant of diabetes, renovascular hypertension, heart failure with preserved EF with grade 2 diastolic dysfunction, coronary artery disease status post PCI, CABG, diabetes, who came to the ER with severe range blood pressures, shortness of breath with dyspnea on exertion.  Oxygen saturation was 89%.  She was placed on CPAP and given nitroglycerin  and route 2 blood pressures in the 200s.  Troponins were elevated in the ER, trended to 560, 666, 825, peaked at 828. Chest x-ray with pulmonary edema.  She was initially placed on CPAP in the ER but quickly transitioned to nasal cannula.  Patient was seen by cardiology.  Her symptoms and elevated troponins were felt to be secondary to hypertensive urgency and flash pulmonary edema.  She was diuresed with IV Lasix  with good response.  Of note, she was prescribed Lasix  but was not taking Lasix  at home.  She was placed on IV nitroglycerin  drip and dose of losartan  increased to 100 mg daily for better control of blood pressures.    Echo revealed normal LVEF, no wall motion abnormalities.  She was subsequently discharged home.  She was able to ambulate without any need for oxygen.  She is recommended to follow-up with PCP as well as cardiology.  Advised to take Lasix  daily and continue prior to admission medications with increase in  losartan  to 100 mg daily.   Discharge Diagnoses:  Principal Problem:   Acute respiratory failure with hypoxia (HCC) Active Problems:   Essential hypertension   Diabetes mellitus with peripheral vascular disease (HCC)   CAD (coronary artery disease)   Acute on chronic diastolic CHF (congestive heart failure) (HCC)   Hypertensive urgency    Discharge Instructions  Discharge Instructions     (HEART FAILURE PATIENTS) Call MD:  Anytime you have any of the following symptoms: 1) 3 pound weight gain in 24 hours or 5 pounds in 1 week 2) shortness of breath, with or without a dry hacking cough 3) swelling in the hands, feet or stomach 4) if you have to sleep on extra pillows at night in order to breathe.   Complete by: As directed    Call MD for:  persistant dizziness or light-headedness   Complete by: As directed    Diet - low sodium heart healthy   Complete by: As directed    Discharge instructions   Complete by: As directed    1.  Continue carvedilol  25 mg daily.  The dose of losartan  is increased to 100 mg from 50 mg daily. 2.  Please take furosemide  40 mg daily. 3.  Continue rest of the medications as you have been taking prior to admission. 4.  Continue to check blood pressure at home as you have been doing. 4.  Call your provider or return to the ER if recurrence of symptoms or worsening symptoms.   Increase activity slowly   Complete by: As directed       Allergies as  of 11/08/2023       Reactions   Tape Rash        Medication List     TAKE these medications    acetaminophen  325 MG tablet Commonly known as: TYLENOL  Take 2 tablets (650 mg total) by mouth every 6 (six) hours as needed for mild pain (pain score 1-3) (or Fever >/= 101).   albuterol  108 (90 Base) MCG/ACT inhaler Commonly known as: VENTOLIN  HFA Inhale 2 puffs into the lungs every 6 (six) hours as needed for shortness of breath or wheezing.   aspirin  EC 81 MG tablet Take 1 tablet (81 mg total) by  mouth daily with breakfast. In the morning   blood glucose meter kit and supplies Dispense based on patient and insurance preference. Use up to four times daily as directed. (FOR ICD-10 E10.9, E11.9).   calcium  carbonate 1500 (600 Ca) MG Tabs tablet Commonly known as: OSCAL Take 600 mg of elemental calcium  by mouth 3 (three) times daily with meals.   Caltrate 600+D Plus Minerals 600-800 MG-UNIT Tabs Take 1 tablet by mouth 3 (three) times daily.   carvedilol  25 MG tablet Commonly known as: COREG  Take 1 tablet (25 mg total) by mouth 2 (two) times daily with a meal.   clopidogrel  75 MG tablet Commonly known as: PLAVIX  Take 1 tablet (75 mg total) by mouth daily.   CORICIDIN HBP COLD/FLU PO Take 1 tablet by mouth daily as needed (cough/cold).   fish oil-omega-3 fatty acids  1000 MG capsule Take 1 g by mouth daily.   furosemide  40 MG tablet Commonly known as: Lasix  Take 1 tablet (40 mg total) by mouth daily.   Glucose Meter Test test strip Generic drug: glucose blood Use 6-10 times a day, Uses ReliOn   HumaLOG  KwikPen 200 UNIT/ML KwikPen Generic drug: insulin  lispro Inject 15-30 Units into the skin 3 (three) times daily before meals. What changed:  how much to take additional instructions   hydroxypropyl methylcellulose / hypromellose 2.5 % ophthalmic solution Commonly known as: ISOPTO TEARS / GONIOVISC Place 1 drop into both eyes 3 (three) times daily as needed (for dry eyes).   Insulin  Syringe 27G X 1/2 0.5 ML Misc Use with Fiasp  insulin  TID   losartan  100 MG tablet Commonly known as: COZAAR  Take 1 tablet (100 mg total) by mouth daily. Start taking on: November 09, 2023 What changed:  medication strength how much to take   montelukast  10 MG tablet Commonly known as: SINGULAIR  Take 1 tablet by mouth once daily with breakfast   nitroGLYCERIN  0.4 MG SL tablet Commonly known as: Nitrostat  Place 1 tablet (0.4 mg total) under the tongue every 5 (five) minutes as  needed for chest pain.   oxybutynin  5 MG 24 hr tablet Commonly known as: DITROPAN -XL Take 1 tablet (5 mg total) by mouth at bedtime.   Ozempic  (2 MG/DOSE) 8 MG/3ML Sopn Generic drug: Semaglutide  (2 MG/DOSE) Inject 2 mg into the skin once a week.   polyethylene glycol 17 g packet Commonly known as: MIRALAX  / GLYCOLAX  Take 17 g by mouth daily as needed. What changed: reasons to take this   potassium chloride  SA 20 MEQ tablet Commonly known as: KLOR-CON  M Take 1 tablet (20 mEq total) by mouth daily.   ReliOn Pen Needles 29G X Misc Generic drug: Insulin  Pen Needle USE 1 NEEDLE TO INJECT INSULIN  SUBCUTANEOUSLY 3 TIMES DAILY   BD ULTRA-FINE PEN NEEDLES 29G X 12.7MM Misc Generic drug: Insulin  Pen Needle USE PENNEEDLES 3 TIMES  DAILY AS DIRECTED   rosuvastatin  40 MG tablet Commonly known as: CRESTOR  Take 1 tablet by mouth once daily   Tresiba  FlexTouch 100 UNIT/ML FlexTouch Pen Generic drug: insulin  degludec Inject 20 Units into the skin at bedtime.   vitamin C 1000 MG tablet Take 1,000 mg by mouth 3 (three) times daily.   Vitamin D -3 125 MCG (5000 UT) Tabs Take 5,000 Units by mouth daily.        Follow-up Information     Lavell Bari LABOR, FNP. Schedule an appointment as soon as possible for a visit in 1 week(s).   Specialty: Family Medicine Contact information: [address] [place] [address] [phone]                Allergies  Allergen Reactions   Tape Rash    Consultations:    Procedures/Studies: ECHOCARDIOGRAM COMPLETE Result Date: 11/07/2023    ECHOCARDIOGRAM REPORT   Patient Name:   ANELI ZARA Date of Exam: 11/07/2023 Medical Rec #:  [id number]         Height:       62.0 in Accession #:    [id number]        Weight:       187.8 lb Date of Birth:  1950-06-27          BSA:          1.861 m Patient Age:    73 years          BP:           157/53 mmHg Patient Gender: F                 HR:           60 bpm. Exam Location:  Inpatient  Procedure: 2D Echo and Strain Analysis (Both Spectral and Color Flow Doppler            were utilized during procedure). Indications:    CHF  History:        Patient has prior history of Echocardiogram examinations. CAD;                 Risk Factors:Hypertension.  Sonographer:    Charmaine Gaskins Referring Phys: [phone] JACOB J STINSON IMPRESSIONS  1. Mid diastolic L wave noted, suggestive of elevated filling pressure and diasolic dysfunction. Left ventricular ejection fraction, by estimation, is 55 to 60%. The left ventricle has normal function. The left ventricle has no regional wall motion abnormalities. There is moderate concentric left ventricular hypertrophy. Left ventricular diastolic parameters are indeterminate. The average left ventricular global longitudinal strain is -16.4 %. The global longitudinal strain is abnormal.  2. Right ventricular systolic function is normal. The right ventricular size is normal. Tricuspid regurgitation signal is inadequate for assessing PA pressure.  3. Left atrial size was moderately dilated.  4. Difficult assessment, 3D MVCA 2.62 cm2. The mitral valve is degenerative. Trivial mitral valve regurgitation. Mild mitral stenosis. The mean mitral valve gradient is 3.0 mmHg. Moderate mitral annular calcification.  5. The aortic valve was not well visualized. Aortic valve regurgitation is not visualized. No aortic stenosis is present.  6. The inferior vena cava is normal in size with greater than 50% respiratory variability, suggesting right atrial pressure of 3 mmHg. Comparison(s): Prior images reviewed side by side. Strain imaging has improved, diastology is worse based on mitral inflow assessment. FINDINGS  Left Ventricle: Mid diastolic L wave noted, suggestive of elevated filling pressure and diasolic dysfunction. Left ventricular  ejection fraction, by estimation, is 55 to 60%. The left ventricle has normal function. The left ventricle has no regional wall motion abnormalities. The  average left ventricular global longitudinal strain is -16.4 %. Strain was performed and the global longitudinal strain is abnormal. The left ventricular internal cavity size was normal in size. There is moderate concentric  left ventricular hypertrophy. Left ventricular diastolic parameters are indeterminate. Right Ventricle: The right ventricular size is normal. No increase in right ventricular wall thickness. Right ventricular systolic function is normal. Tricuspid regurgitation signal is inadequate for assessing PA pressure. Left Atrium: Left atrial size was moderately dilated. Right Atrium: Right atrial size was normal in size. Pericardium: There is no evidence of pericardial effusion. Mitral Valve: Difficult assessment, 3D MVCA 2.62 cm2. The mitral valve is degenerative in appearance. Moderate mitral annular calcification. Trivial mitral valve regurgitation. Mild mitral valve stenosis. MV peak gradient, 9.7 mmHg. The mean mitral valve  gradient is 3.0 mmHg. Tricuspid Valve: The tricuspid valve is normal in structure. Tricuspid valve regurgitation is not demonstrated. No evidence of tricuspid stenosis. Aortic Valve: The aortic valve was not well visualized. Aortic valve regurgitation is not visualized. No aortic stenosis is present. Pulmonic Valve: The pulmonic valve was not well visualized. Pulmonic valve regurgitation is not visualized. No evidence of pulmonic stenosis. Aorta: The aortic root is normal in size and structure. Venous: The inferior vena cava is normal in size with greater than 50% respiratory variability, suggesting right atrial pressure of 3 mmHg. IAS/Shunts: No atrial level shunt detected by color flow Doppler.  LEFT VENTRICLE PLAX 2D LVIDd:         4.60 cm     Diastology LVIDs:         3.10 cm     LV e' medial:    3.59 cm/s LV PW:         1.30 cm     LV E/e' medial:  37.6 LV IVS:        1.20 cm     LV e' lateral:   5.33 cm/s LVOT diam:     1.80 cm     LV E/e' lateral: 25.3 LVOT Area:     2.54  cm                            2D Longitudinal Strain                            2D Strain GLS Avg:     -16.4 % LV Volumes (MOD) LV vol d, MOD A2C: 69.3 ml LV vol d, MOD A4C: 75.2 ml LV vol s, MOD A2C: 29.0 ml LV vol s, MOD A4C: 28.7 ml LV SV MOD A2C:     40.3 ml LV SV MOD A4C:     75.2 ml LV SV MOD BP:      43.1 ml RIGHT VENTRICLE RV Basal diam:  2.60 cm RV Mid diam:    2.50 cm RV S prime:     11.90 cm/s LEFT ATRIUM             Index        RIGHT ATRIUM           Index LA diam:        4.30 cm 2.31 cm/m   RA Area:     15.60 cm LA Vol (A2C):   88.2 ml 47.38 ml/m  RA Volume:   33.60 ml  18.05 ml/m LA Vol (A4C):   81.2 ml 43.62 ml/m LA Biplane Vol: 86.6 ml 46.52 ml/m   AORTA Ao Asc diam: 3.20 cm MITRAL VALVE MV Area (PHT): 4.41 cm     SHUNTS MV Peak grad:  9.7 mmHg     Systemic Diam: 1.80 cm MV Mean grad:  3.0 mmHg MV Vmax:       1.56 m/s MV Vmean:      81.0 cm/s MV Decel Time: 172 msec MV E velocity: 135.00 cm/s MV A velocity: 111.00 cm/s MV E/A ratio:  1.22 Stanly Leavens MD Electronically signed by Stanly Leavens MD Signature Date/Time: 11/07/2023/12:30:08 PM    Final    DG Chest Portable 1 View Result Date: 11/06/2023 CLINICAL DATA:  Shortness of breath EXAM: PORTABLE CHEST 1 VIEW COMPARISON:  Chest x-ray 08/15/2023 FINDINGS: Heart is enlarged. Patient is status post cardiac surgery. There central pulmonary vascular congestion. There is no focal lung infiltrate, pleural effusion or pneumothorax. No acute fractures are seen. IMPRESSION: Cardiomegaly with central pulmonary vascular congestion. Electronically Signed   By: Greig Pique M.D.   On: 11/06/2023 19:44      Subjective:   Discharge Exam: Vitals:   11/08/23 0956 11/08/23 1101  BP: 116/73 (!) 116/44  Pulse: 61 60  Resp: 19 12  Temp:  97.8 F (36.6 C)  SpO2: 96% 96%    General: Pt is alert, awake, not in acute distress Cardiovascular: rate controlled, S1/S2 + Respiratory: bilateral decreased breath sounds at  bases Abdominal: Soft, NT, ND, bowel sounds + Extremities: no edema, no cyanosis    The results of significant diagnostics from this hospitalization (including imaging, microbiology, ancillary and laboratory) are listed below for reference.     Microbiology: Recent Results (from the past 240 hours)  Culture, blood (routine x 2)     Status: None (Preliminary result)   Collection Time: 11/06/23  7:14 PM   Specimen: BLOOD  Result Value Ref Range Status   Specimen Description BLOOD BLOOD LEFT WRIST  Final   Special Requests   Final    BOTTLES DRAWN AEROBIC AND ANAEROBIC Blood Culture adequate volume   Culture   Final    NO GROWTH 2 DAYS Performed at [facility], [address] [place], [place] [address]    Report Status PENDING  Incomplete  Culture, blood (routine x 2)     Status: None (Preliminary result)   Collection Time: 11/06/23  7:37 PM   Specimen: BLOOD  Result Value Ref Range Status   Specimen Description BLOOD LEFT ANTECUBITAL  Final   Special Requests   Final    BOTTLES DRAWN AEROBIC AND ANAEROBIC Blood Culture adequate volume   Culture   Final    NO GROWTH 2 DAYS Performed at [facility], [address], [place], [place] [address]    Report Status PENDING  Incomplete  MRSA Next Gen by PCR, Nasal     Status: None   Collection Time: 11/07/23  3:43 AM   Specimen: Nasal Mucosa; Nasal Swab  Result Value Ref Range Status   MRSA by PCR Next Gen NOT DETECTED NOT DETECTED Final    Comment: (NOTE) The GeneXpert MRSA Assay (FDA approved for NASAL specimens only), is one component of a comprehensive MRSA colonization surveillance program. It is not intended to diagnose MRSA infection nor to guide or monitor treatment for MRSA infections. Test performance is not FDA approved in patients less than 48 years old. Performed at [facility], [address], [place], [place] [address]      Labs: BNP (last 3 results) Recent Labs    08/15/23 1421 09/03/23 1239  11/06/23 1913  BNP 269.0* 55.9 298.0*   Basic Metabolic Panel: Recent Labs  Lab 11/06/23 1913 11/07/23 0930 11/08/23 0817  NA 132*  --  132*  K 3.8  --  4.0  CL 96*  --  94*  CO2 26  --  28  GLUCOSE 211*  --  256*  BUN 12  --  17  CREATININE 0.80  --  1.17*  CALCIUM  8.9  --  9.2  MG 2.0 1.7 1.7   Liver Function Tests: No results for input(s): AST, ALT, ALKPHOS, BILITOT, PROT, ALBUMIN  in the last 168 hours. No results for input(s): LIPASE, AMYLASE in the last 168 hours. No results for input(s): AMMONIA in the last 168 hours. CBC: Recent Labs  Lab 11/06/23 1913  WBC 9.6  HGB 13.2  HCT 40.7  MCV 95.5  PLT 212   Cardiac Enzymes: No results for input(s): CKTOTAL, CKMB, CKMBINDEX, TROPONINI in the last 168 hours. BNP: Invalid input(s): POCBNP CBG: Recent Labs  Lab 11/07/23 1609 11/07/23 1948 11/07/23 2123 11/08/23 0645 11/08/23 1100  GLUCAP 176* 245* 219* 136* 216*   D-Dimer No results for input(s): DDIMER in the last 72 hours. Hgb A1c No results for input(s): HGBA1C in the last 72 hours. Lipid Profile No results for input(s): CHOL, HDL, LDLCALC, TRIG, CHOLHDL, LDLDIRECT in the last 72 hours. Thyroid  function studies No results for input(s): TSH, T4TOTAL, T3FREE, THYROIDAB in the last 72 hours.  Invalid input(s): FREET3 Anemia work up No results for input(s): VITAMINB12, FOLATE, FERRITIN, TIBC, IRON, RETICCTPCT in the last 72 hours. Urinalysis    Component Value Date/Time   COLORURINE COLORLESS (A) 11/06/2023 2150   APPEARANCEUR CLEAR 11/06/2023 2150   APPEARANCEUR Clear 04/24/2016 1156   LABSPEC 1.004 (L) 11/06/2023 2150   PHURINE 7.0 11/06/2023 2150   GLUCOSEU 50 (A) 11/06/2023 2150   HGBUR NEGATIVE 11/06/2023 2150   BILIRUBINUR NEGATIVE 11/06/2023 2150   BILIRUBINUR Negative 04/24/2016 1156   KETONESUR NEGATIVE 11/06/2023 2150   PROTEINUR 30 (A) 11/06/2023 2150   UROBILINOGEN 0.2  10/27/2013 1341   NITRITE NEGATIVE 11/06/2023 2150   LEUKOCYTESUR NEGATIVE 11/06/2023 2150   Sepsis Labs Recent Labs  Lab 11/06/23 1913  WBC 9.6   Microbiology Recent Results (from the past 240 hours)  Culture, blood (routine x 2)     Status: None (Preliminary result)   Collection Time: 11/06/23  7:14 PM   Specimen: BLOOD  Result Value Ref Range Status   Specimen Description BLOOD BLOOD LEFT WRIST  Final   Special Requests   Final    BOTTLES DRAWN AEROBIC AND ANAEROBIC Blood Culture adequate volume   Culture   Final    NO GROWTH 2 DAYS Performed at [facility], [address] [place], [place] [address]    Report Status PENDING  Incomplete  Culture, blood (routine x 2)     Status: None (Preliminary result)   Collection Time: 11/06/23  7:37 PM   Specimen: BLOOD  Result Value Ref Range Status   Specimen Description BLOOD LEFT ANTECUBITAL  Final   Special Requests   Final    BOTTLES DRAWN AEROBIC AND ANAEROBIC Blood Culture adequate volume   Culture   Final    NO GROWTH 2 DAYS Performed at [facility], [address] [place], [place] [address]    Report Status  PENDING  Incomplete  MRSA Next Gen by PCR, Nasal     Status: None   Collection Time: 11/07/23  3:43 AM   Specimen: Nasal Mucosa; Nasal Swab  Result Value Ref Range Status   MRSA by PCR Next Gen NOT DETECTED NOT DETECTED Final    Comment: (NOTE) The GeneXpert MRSA Assay (FDA approved for NASAL specimens only), is one component of a comprehensive MRSA colonization surveillance program. It is not intended to diagnose MRSA infection nor to guide or monitor treatment for MRSA infections. Test performance is not FDA approved in patients less than 29 years old. Performed at [facility], [address] [place], [place] [address]      Time coordinating discharge: 35 minutes  SIGNED:   Derryl Duval, MD  [facility] 11/08/2023, 2:46 PM

## 2023-11-08 NOTE — Progress Notes (Signed)
 Progress Note  Patient Name: Leah Ferrell Date of Encounter: 11/08/2023 Primary Cardiologist: Deatrice Cage, MD   Subjective   Overnight blood pressure has improved. Patient notes brisk diuresis. No CP, SOB, Palpitations.  Vital Signs    Vitals:   11/07/23 1950 11/08/23 0019 11/08/23 0511 11/08/23 0741  BP: 122/66 (!) 123/45 (!) 145/47 (!) 116/46  Pulse: 64 84 60 70  Resp: 20 19 18 12   Temp: 98.1 F (36.7 C) 98.2 F (36.8 C) 98.1 F (36.7 C) 97.7 F (36.5 C)  TempSrc: Oral Oral Oral Oral  SpO2: 93% 96% 94% 92%  Weight:   85.3 kg   Height:        Intake/Output Summary (Last 24 hours) at 11/08/2023 0818 Last data filed at 11/08/2023 0746 Gross per 24 hour  Intake 1160.01 ml  Output 3975 ml  Net -2814.99 ml   Filed Weights   11/07/23 0344 11/08/23 0511  Weight: 85.2 kg 85.3 kg    Physical Exam   GEN: No acute distress.   Neck: No JVD Cardiac: RRR, no rubs, or gallops. Diastolic murmur Respiratory: Clear to auscultation bilaterally. GI: Soft, nontender, non-distended  MS: No edema  Labs   Telemetry: SR with rare PVC   Chemistry Recent Labs  Lab 11/06/23 1913  NA 132*  K 3.8  CL 96*  CO2 26  GLUCOSE 211*  BUN 12  CREATININE 0.80  CALCIUM  8.9  GFRNONAA >60  ANIONGAP 10     Hematology Recent Labs  Lab 11/06/23 1913  WBC 9.6  RBC 4.26  HGB 13.2  HCT 40.7  MCV 95.5  MCH 31.0  MCHC 32.4  RDW 15.5  PLT 212     BNP Recent Labs  Lab 11/06/23 1913  BNP 298.0*       Cardiac Studies   Cardiac Studies & Procedures   ______________________________________________________________________________________________ CARDIAC CATHETERIZATION  CARDIAC CATHETERIZATION 04/08/2023  Conclusion   Mid LAD lesion is 30% stenosed.   Prox RCA lesion is 40% stenosed.   2nd Diag lesion is 95% stenosed.   Ost LAD to Prox LAD lesion is 95% stenosed.   Ost Cx to Prox Cx lesion is 100% stenosed.   Dist RCA lesion is 95% stenosed with 95%  stenosed side branch in RPDA.   Mid RCA to Dist RCA lesion is 80% stenosed.   Prox RCA to Mid RCA lesion is 70% stenosed.   Mid Cx lesion is 100% stenosed.   A drug-eluting stent was successfully placed using a STENT ONYX FRONTIER 2.5X22.   A drug-eluting stent was successfully placed using a STENT ONYX FRONTIER [id number].   A drug-eluting stent was successfully placed using a STENT ONYX FRONTIER 3.0X15.   Balloon angioplasty was performed using a BALLN SAPPHIRE 2.5X12.   Post intervention, there is a 20% residual stenosis.   Post intervention, there is a 10% residual stenosis.   Post intervention, there is a 0% residual stenosis.   Post intervention, the side branch was reduced to 0% residual stenosis.   LIMA graft was visualized by angiography and is normal in caliber.   SVG and is normal in caliber.   SVG and is normal in caliber.   The graft exhibits no disease.   The graft exhibits minimal luminal irregularities.   The graft exhibits no disease.   The left ventricular systolic function is normal.   LV end diastolic pressure is mildly elevated.   The left ventricular ejection fraction is 55-65% by visual estimate.  1.  Heavily calcified coronary arteries with severe underlying three-vessel coronary artery disease.  Patent grafts including LIMA to LAD, SVG to diagonal and SVG to OM 3.  Severe progression of native coronary artery disease including the right coronary artery which was not bypassed. 2.  Normal LV systolic function and mildly elevated left ventricular end-diastolic pressure. 3.  Widely patent left renal artery stent with no significant restenosis.  Patent right renal artery stent with significant in-stent restenosis. 4.  Very difficult but successful PCI and 3 overlapped drug-eluting stent placement to the right coronary artery.  Very difficult procedure due to diffuse and calcified disease.  I had to accept 20% residual stenosis given inability to fully post dilate the stent  with a high-pressure noncompliant balloon.  Recommendations: Continue dual antiplatelet therapy indefinitely as tolerated. Continue aggressive treatment of risk factors. Right renal artery in-stent restenosis can be treated as an outpatient if blood pressure continues to be uncontrolled in spite of 3 antihypertensive medications.  Findings Coronary Findings Diagnostic  Dominance: Right  Left Main There is mild diffuse disease throughout the vessel.  Left Anterior Descending Ost LAD to Prox LAD lesion is 95% stenosed. The lesion is severely calcified. The lesion was previously treated . Mid LAD lesion is 30% stenosed. The lesion was previously treated .  Second Diagonal Branch Vessel is large in size. 2nd Diag lesion is 95% stenosed.  Third Diagonal Branch Vessel is small in size. The vessel exhibits minimal luminal irregularities.  Left Circumflex Ost Cx to Prox Cx lesion is 100% stenosed. The lesion is severely calcified. Mid Cx lesion is 100% stenosed.  First Obtuse Marginal Branch The vessel exhibits minimal luminal irregularities.  Second Obtuse Marginal Branch The vessel exhibits minimal luminal irregularities.  Third Obtuse Marginal Branch There is mild disease in the vessel.  Right Coronary Artery Prox RCA lesion is 40% stenosed. The lesion was previously treated . Prox RCA to Mid RCA lesion is 70% stenosed. The lesion is moderately calcified. Mid RCA to Dist RCA lesion is 80% stenosed. The lesion is type C. The lesion is severely calcified. The lesion was not previously treated . Dist RCA lesion is 95% stenosed with 95% stenosed side branch in RPDA. The lesion is type C. The lesion is severely calcified.  Right Posterior Descending Artery The vessel exhibits minimal luminal irregularities.  Right Posterior Atrioventricular Artery Vessel is small in size.  LIMA LIMA Graft To Dist LAD LIMA graft was visualized by angiography and is normal in caliber.  The  graft exhibits no disease.  Saphenous Graft To 3rd Mrg SVG and is normal in caliber.  The graft exhibits minimal luminal irregularities.  Saphenous Graft To 2nd Diag SVG and is normal in caliber.  The graft exhibits no disease.  Intervention  Prox RCA to Mid RCA lesion Stent A drug-eluting stent was successfully placed using a STENT ONYX FRONTIER 3.0X15. Maximum pressure: 14 atm. Inflation time: 20 sec. Post-stent angioplasty was performed. The stent balloon was advanced and inflated to 16 atm at the overlap area. Post-Intervention Lesion Assessment The intervention was successful. Pre-interventional TIMI flow is 3. Post-intervention TIMI flow is 3. No complications occurred at this lesion. There is a 0% residual stenosis post intervention.  Mid RCA to Dist RCA lesion Stent Pre-stent angioplasty was performed using a BALLN SAPPHIRE 2.5X12. Maximum pressure:  12 atm. Inflation time:  30 sec. A drug-eluting stent was successfully placed using a STENT ONYX FRONTIER [id number]. Maximum pressure: 14 atm. Inflation time: 20 sec. Stent overlaps previously  placed stent. Post-stent angioplasty was performed using a BALL SAPPHIRE NC24 2.75X22. Maximum pressure:  16 atm. Inflation time:  20 sec. Post-Intervention Lesion Assessment The intervention was successful. Pre-interventional TIMI flow is 3. Post-intervention TIMI flow is 3. No complications occurred at this lesion. There is a 10% residual stenosis post intervention.  Dist RCA lesion with side branch in RPDA Angioplasty - Main Branch Lesion length:  20 mm. CATH VISTA GUIDE 6FR JR4 guide catheter was inserted. WIRE RUNTHROUGH .[id number] guidewire used to cross lesion. Balloon angioplasty was performed using a BALLN SAPPHIRE 2.5X12. Maximum pressure: 10 atm. Inflation time: 30 sec. Stent - Main Branch A drug-eluting stent was successfully placed using a STENT ONYX FRONTIER 2.5X22. Maximum pressure: 14 atm. Inflation time: 20 sec. Post-stent  angioplasty was performed using a BALL SAPPHIRE NC24 2.75X22. Maximum pressure:  22 atm. Inflation time:  20 sec. The stent could not be advanced due to significant calcifications of disease in the vessel.  I had to use a telescope extension catheter which was advanced over the 2.5 balloon.  A 2.5 balloon was inflated distally and balloon assisted tracking was performed to advance a telescope catheter to the mid to distal RCA.  I was able to advance the stents after this. Post-Intervention Lesion Assessment The intervention was successful. Pre-interventional TIMI flow is 3. Post-intervention TIMI flow is 3. No complications occurred at this lesion. There is a 20% residual stenosis in the main branch post intervention. There is a 0% residual stenosis in the side branch post intervention.   CARDIAC CATHETERIZATION  CARDIAC CATHETERIZATION 05/09/2020  Conclusion  Prox RCA to Mid RCA lesion is 40% stenosed.  Mid RCA to Dist RCA lesion is 40% stenosed.  Ost LAD to Prox LAD lesion is 80% stenosed.  Mid LAD lesion is 30% stenosed.  2nd Diag lesion is 95% stenosed.  Ost Cx to Prox Cx lesion is 95% stenosed.  1.  Heavily calcified coronary arteries with significant three-vessel coronary artery disease.  Patent stents in the LAD but there is significant restenosis in the proximal stent.  In addition, there is severe ostial stenosis in a large second diagonal, severe ostial stenosis in the left circumflex which is heavily calcified and moderate diffuse disease in the right coronary artery. 2.  Left ventricular angiography was not performed.  LVEDP was normal. 3.  Patent bilateral renal artery stents with significant restenosis in the right stent. 4.  Severe bilateral heavily calcified ostial common iliac artery disease extending into the distal aorta. 5.  Right lower extremity: Moderate common femoral artery disease, flush occlusion of the SFA with reconstitution distally via collaterals from the  profunda, short occlusion and above-the-knee popliteal artery with collaterals and three-vessel runoff below the knee. 6.  Left lower extremity: Flush occlusion of the SFA with collaterals from the profunda and two-vessel runoff below the knee. 7.  Successful bilateral kissing stent placement of common iliac arteries extending into the distal aorta.  Recommendations: Continue dual antiplatelet therapy for at least 2 weeks. From a cardiac standpoint, recommend evaluation for CABG in few weeks from now once she is no longer in need of clopidogrel .  This can be done as an outpatient.  I am going to obtain an echocardiogram to finish her cardiac evaluation. From PAD standpoint, I am hopeful that she will be able to heal the wound on her right lower extremity after today's intervention.  However, if that does not happen, the patient will need right femoral popliteal bypass after CABG. Given length  of procedure, bilateral access and contrast load, will observe the patient overnight with possible discharge home tomorrow.  Findings Coronary Findings Diagnostic  Dominance: Right  Left Main The vessel exhibits minimal luminal irregularities.  Left Anterior Descending Ost LAD to Prox LAD lesion is 80% stenosed. The lesion is severely calcified. The lesion was previously treated. Mid LAD lesion is 30% stenosed. The lesion was previously treated.  Second Diagonal Branch Vessel is large in size. 2nd Diag lesion is 95% stenosed.  Third Diagonal Branch Vessel is small in size. The vessel exhibits minimal luminal irregularities.  Left Circumflex Ost Cx to Prox Cx lesion is 95% stenosed. The lesion is severely calcified.  First Obtuse Marginal Branch The vessel exhibits minimal luminal irregularities.  Second Obtuse Marginal Branch The vessel exhibits minimal luminal irregularities.  Third Obtuse Marginal Branch There is mild disease in the vessel.  Right Coronary Artery Prox RCA to Mid RCA  lesion is 40% stenosed. The lesion was previously treated. Mid RCA to Dist RCA lesion is 40% stenosed.  Right Posterior Descending Artery The vessel exhibits minimal luminal irregularities.  Right Posterior Atrioventricular Artery Vessel is small in size.  Intervention  No interventions have been documented.   STRESS TESTS  NM MYOCAR MULTI W/SPECT W 05/07/2020  Narrative  Findings consistent with prior anterolateral/lateral/inferolateral myocardial infarction with large area of peri-infarct ischemia.  This is a high risk study based on degree of peri-infarct ischemia.  The left ventricular ejection fraction is normal (55-65%).   ECHOCARDIOGRAM  ECHOCARDIOGRAM COMPLETE 11/07/2023  Narrative ECHOCARDIOGRAM REPORT    Patient Name:   Leah Ferrell Date of Exam: 11/07/2023 Medical Rec #:  [id number]         Height:       62.0 in Accession #:    [id number]        Weight:       187.8 lb Date of Birth:  1950/05/31          BSA:          1.861 m Patient Age:    73 years          BP:           157/53 mmHg Patient Gender: F                 HR:           60 bpm. Exam Location:  Inpatient  Procedure: 2D Echo and Strain Analysis (Both Spectral and Color Flow Doppler were utilized during procedure).  Indications:    CHF  History:        Patient has prior history of Echocardiogram examinations. CAD; Risk Factors:Hypertension.  Sonographer:    Charmaine Gaskins Referring Phys: [id number] JACOB J STINSON  IMPRESSIONS   1. Mid diastolic L wave noted, suggestive of elevated filling pressure and diasolic dysfunction. Left ventricular ejection fraction, by estimation, is 55 to 60%. The left ventricle has normal function. The left ventricle has no regional wall motion abnormalities. There is moderate concentric left ventricular hypertrophy. Left ventricular diastolic parameters are indeterminate. The average left ventricular global longitudinal strain is -16.4 %. The global longitudinal  strain is abnormal. 2. Right ventricular systolic function is normal. The right ventricular size is normal. Tricuspid regurgitation signal is inadequate for assessing PA pressure. 3. Left atrial size was moderately dilated. 4. Difficult assessment, 3D MVCA 2.62 cm2. The mitral valve is degenerative. Trivial mitral valve regurgitation. Mild mitral stenosis. The mean mitral valve gradient is 3.0 mmHg. Moderate  mitral annular calcification. 5. The aortic valve was not well visualized. Aortic valve regurgitation is not visualized. No aortic stenosis is present. 6. The inferior vena cava is normal in size with greater than 50% respiratory variability, suggesting right atrial pressure of 3 mmHg.  Comparison(s): Prior images reviewed side by side. Strain imaging has improved, diastology is worse based on mitral inflow assessment.  FINDINGS Left Ventricle: Mid diastolic L wave noted, suggestive of elevated filling pressure and diasolic dysfunction. Left ventricular ejection fraction, by estimation, is 55 to 60%. The left ventricle has normal function. The left ventricle has no regional wall motion abnormalities. The average left ventricular global longitudinal strain is -16.4 %. Strain was performed and the global longitudinal strain is abnormal. The left ventricular internal cavity size was normal in size. There is moderate concentric left ventricular hypertrophy. Left ventricular diastolic parameters are indeterminate.  Right Ventricle: The right ventricular size is normal. No increase in right ventricular wall thickness. Right ventricular systolic function is normal. Tricuspid regurgitation signal is inadequate for assessing PA pressure.  Left Atrium: Left atrial size was moderately dilated.  Right Atrium: Right atrial size was normal in size.  Pericardium: There is no evidence of pericardial effusion.  Mitral Valve: Difficult assessment, 3D MVCA 2.62 cm2. The mitral valve is degenerative in  appearance. Moderate mitral annular calcification. Trivial mitral valve regurgitation. Mild mitral valve stenosis. MV peak gradient, 9.7 mmHg. The mean mitral valve gradient is 3.0 mmHg.  Tricuspid Valve: The tricuspid valve is normal in structure. Tricuspid valve regurgitation is not demonstrated. No evidence of tricuspid stenosis.  Aortic Valve: The aortic valve was not well visualized. Aortic valve regurgitation is not visualized. No aortic stenosis is present.  Pulmonic Valve: The pulmonic valve was not well visualized. Pulmonic valve regurgitation is not visualized. No evidence of pulmonic stenosis.  Aorta: The aortic root is normal in size and structure.  Venous: The inferior vena cava is normal in size with greater than 50% respiratory variability, suggesting right atrial pressure of 3 mmHg.  IAS/Shunts: No atrial level shunt detected by color flow Doppler.   LEFT VENTRICLE PLAX 2D LVIDd:         4.60 cm     Diastology LVIDs:         3.10 cm     LV e' medial:    3.59 cm/s LV PW:         1.30 cm     LV E/e' medial:  37.6 LV IVS:        1.20 cm     LV e' lateral:   5.33 cm/s LVOT diam:     1.80 cm     LV E/e' lateral: 25.3 LVOT Area:     2.54 cm 2D Longitudinal Strain 2D Strain GLS Avg:     -16.4 % LV Volumes (MOD) LV vol d, MOD A2C: 69.3 ml LV vol d, MOD A4C: 75.2 ml LV vol s, MOD A2C: 29.0 ml LV vol s, MOD A4C: 28.7 ml LV SV MOD A2C:     40.3 ml LV SV MOD A4C:     75.2 ml LV SV MOD BP:      43.1 ml  RIGHT VENTRICLE RV Basal diam:  2.60 cm RV Mid diam:    2.50 cm RV S prime:     11.90 cm/s  LEFT ATRIUM             Index        RIGHT ATRIUM  Index LA diam:        4.30 cm 2.31 cm/m   RA Area:     15.60 cm LA Vol (A2C):   88.2 ml 47.38 ml/m  RA Volume:   33.60 ml  18.05 ml/m LA Vol (A4C):   81.2 ml 43.62 ml/m LA Biplane Vol: 86.6 ml 46.52 ml/m  AORTA Ao Asc diam: 3.20 cm  MITRAL VALVE MV Area (PHT): 4.41 cm     SHUNTS MV Peak grad:  9.7 mmHg      Systemic Diam: 1.80 cm MV Mean grad:  3.0 mmHg MV Vmax:       1.56 m/s MV Vmean:      81.0 cm/s MV Decel Time: 172 msec MV E velocity: 135.00 cm/s MV A velocity: 111.00 cm/s MV E/A ratio:  1.22  Stanly Leavens MD Electronically signed by Stanly Leavens MD Signature Date/Time: 11/07/2023/12:30:08 PM    Final   TEE  ECHO INTRAOPERATIVE TEE 06/25/2020  Narrative *INTRAOPERATIVE TRANSESOPHAGEAL REPORT *    Patient Name:   Leah Ferrell Date of Exam: 06/25/2020 Medical Rec #:  [id number]         Height:       62.5 in Accession #:    [id number]        Weight:       204.4 lb Date of Birth:  1950-06-27          BSA:          1.94 m Patient Age:    70 years          BP:           174/47 mmHg Patient Gender: F                 HR:           85 bpm. Exam Location:  Inpatient  Transesophogeal exam was perform intraoperatively during surgical procedure. Patient was closely monitored under general anesthesia during the entirety of examination.  Indications:     CABG Performing Phys: [id number] AMNJILD Z ATKINS Diagnosing Phys: Zachary Ee MD  Complications: No known complications during this procedure. POST-OP IMPRESSIONS - Left Ventricle: The left ventricle is unchanged from pre-bypass. - Right Ventricle: The right ventricle appears unchanged from pre-bypass. - Aorta: The aorta appears unchanged from pre-bypass. - Left Atrium: The left atrium appears unchanged from pre-bypass. - Left Atrial Appendage: The left atrial appendage appears unchanged from pre-bypass. - Aortic Valve: The aortic valve appears unchanged from pre-bypass. - Mitral Valve: The mitral valve appears unchanged from pre-bypass. - Tricuspid Valve: The tricuspid valve appears unchanged from pre-bypass. - Pulmonic Valve: The pulmonic valve appears unchanged from pre-bypass. - Interatrial Septum: The interatrial septum appears unchanged from pre-bypass. - Interventricular Septum: The interventricular septum  appears unchanged from pre-bypass. - Pericardium: The pericardium appears unchanged from pre-bypass. - Comments: good LV function post-bypass No RWMA.  PRE-OP FINDINGS Left Ventricle: The left ventricle has normal systolic function, with an ejection fraction of 60-65%. The cavity size was normal. The left ventricular wall thickness was not assessed. There is moderate concentric left ventricular hypertrophy. Left ventricular diastolic parameters were normal.   Right Ventricle: The right ventricle has normal systolic function. The cavity was normal. There is no increase in right ventricular wall thickness.  Left Atrium: Left atrial size was normal in size. No left atrial/left atrial appendage thrombus was detected. The left atrial appendage is well visualized and there is evidence of thrombus present.  Right Atrium: Right  atrial size was normal in size.  Interatrial Septum: No atrial level shunt detected by color flow Doppler. There is no evidence of a patent foramen ovale.  Pericardium: There is no evidence of pericardial effusion.  Mitral Valve: The mitral valve is normal in structure. Mitral valve regurgitation is mild by color flow Doppler. The MR jet is centrally-directed. There is No evidence of mitral stenosis.  Tricuspid Valve: The tricuspid valve was normal in structure. Tricuspid valve regurgitation was not visualized by color flow Doppler. No evidence of tricuspid stenosis is present.  Aortic Valve: The aortic valve is normal in structure. Aortic valve regurgitation was not visualized by color flow Doppler. There is no stenosis of the aortic valve. There is moderate thickening and moderate calcification present on the aortic valve right coronary cusp with normal mobility.  Pulmonic Valve: The pulmonic valve was normal in structure, with normal. Pulmonic valve regurgitation is not visualized by color flow Doppler.   Aorta: The aortic root and ascending aorta are normal in size  and structure.  Shunts: There is no evidence of an atrial septal defect.  +--------------+--------++ LEFT VENTRICLE         +--------------+--------++ PLAX 2D                +--------------+--------++ LVIDd:        3.60 cm  +--------------+--------++ LVIDs:        2.20 cm  +--------------+--------++ LVOT diam:    2.10 cm  +--------------+--------++ LV SV:        38 ml    +--------------+--------++ LV SV Index:  18.50    +--------------+--------++ LVOT Area:    3.46 cm +--------------+--------++                        +--------------+--------++   +-------------+-------++ AORTA                +-------------+-------++ Ao Root diam:2.90 cm +-------------+-------++   +--------------+-------+ SHUNTS                +--------------+-------+ Systemic Diam:2.10 cm +--------------+-------+   Zachary Ee MD Electronically signed by Zachary Ee MD Signature Date/Time: 06/25/2020/2:58:00 PM    Final        ______________________________________________________________________________________________        Assessment & Plan   Hypertensive emergency with acute pulmonary edema and secondary non-ST elevation myocardial infarction (NSTEMI) Severe hypertension with blood pressure reaching 210/161, leading to acute pulmonary edema and secondary NSTEMI. Symptoms have improved with diuresis and blood pressure management. Current blood pressure is 180/54. Troponin levels elevated from 560 to 600. - Stressed to patient the importance of taking her lasix  40 mg PO Daily (was not take PTA) - increase losartan  to 100 mg at discharge (planned Dc today   Coronary artery disease, post-CABG and PCI Coronary artery disease with three-vessel CABG in 2022 and PCI of non-grafted native RCA in February. Currently managed with aspirin , carvedilol , and Plavix . No new wall motion abnormalities noted in previous echocardiogram despite PCI. 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 Current episode of severe hypertension may be related to this condition. - BP is current controlled on this therapy   Calcific mitral valve disease Calcific mitral valve disease with mitral annular calcification but no significant valve leakage. No immediate intervention planned unless echocardiogram shows concerning findings. - Low threshold to increase her diuretic regimen.   Hyperlipidemia - Continue rosuvastatin   for hyperlipidemia management  Has f/u with Dr. Darron 11/24/23; at that time and PCP 11/12/23 - at 9/25 visit consider BMP and BNP; may need increase in PO lasix  dose - at 11/24/23 repeat BP testing advised, she report hx of ambulatory labile BP   For questions or updates, please contact [facility] Please consult [email or web address] for contact info under Cardiology/STEMI.      Stanly Leavens, MD [facility] Cardiologist [facility]  [address] [place], [address] [place], [place] [address] [phone]  8:18 AM

## 2023-11-08 NOTE — Plan of Care (Signed)

## 2023-11-09 ENCOUNTER — Other Ambulatory Visit: Payer: Self-pay | Admitting: Family

## 2023-11-09 ENCOUNTER — Telehealth: Payer: Self-pay | Admitting: *Deleted

## 2023-11-09 NOTE — Transitions of Care (Post Inpatient/ED Visit) (Signed)
 11/09/2023  Name: Leah Ferrell MRN: [id number] DOB: March 01, 1950  Today's TOC FU Call Status: Today's TOC FU Call Status:: Successful TOC FU Call Completed TOC FU Call Complete Date: 11/09/23 Patient's Name and Date of Birth confirmed.  Transition Care Management Follow-up Telephone Call Date of Discharge: 11/08/23 Discharge Facility: [facility]) Type of Discharge: Inpatient Admission Primary Inpatient Discharge Diagnosis:: Acute respiratory failure with hypoxia How have you been since you were released from the hospital?: Better Any questions or concerns?: No  Items Reviewed: Did you receive and understand the discharge instructions provided?: Yes Any new allergies since your discharge?: No Dietary orders reviewed?: Yes Type of Diet Ordered:: heart healthy, low sodium Do you have support at home?: Yes People in Home [RPT]: alone Name of Support/Comfort Primary Source: pt calls on neighbor if needed  Medications Reviewed Today: Medications Reviewed Today     Reviewed by Aura Mliss LABOR, RN (Registered Nurse) on 11/09/23 at 0930  Med List Status: <None>   Medication Order Taking? Sig Documenting Provider Last Dose Status Informant  acetaminophen  (TYLENOL ) 325 MG tablet [id number] Yes Take 2 tablets (650 mg total) by mouth every 6 (six) hours as needed for mild pain (pain score 1-3) (or Fever >/= 101). Pearlean Manus, MD  Active Self, Pharmacy Records  albuterol  (VENTOLIN  HFA) 108 (90 Base) MCG/ACT inhaler [id number] Yes Inhale 2 puffs into the lungs every 6 (six) hours as needed for shortness of breath or wheezing. Pearlean Manus, MD  Active Self, Pharmacy Records  Ascorbic Acid  (VITAMIN C) 1000 MG tablet [id number] Yes Take 1,000 mg by mouth 3 (three) times daily.  [provider]  Active Self, Pharmacy Records  aspirin  EC 81 MG tablet [id number] Yes Take 1 tablet (81 mg total) by mouth daily with breakfast. In the morning Pearlean Manus, MD  Active Self, Pharmacy  Records  BD ULTRA-FINE PEN NEEDLES 29G X 12.7MM MISC [id number] Yes USE PENNEEDLES 3 TIMES  DAILY AS DIRECTED Lavell Bari LABOR, FNP  Active Self, Pharmacy Records  blood glucose meter kit and supplies [id number] Yes Dispense based on patient and insurance preference. Use up to four times daily as directed. (FOR ICD-10 E10.9, E11.9). Lavell Bari LABOR, FNP  Active Self, Pharmacy Records  calcium  carbonate (OSCAL) 1500 (600 Ca) MG TABS tablet [id number]  Take 600 mg of elemental calcium  by mouth 3 (three) times daily with meals.  Patient not taking: Reported on 11/09/2023   [provider]  Active Self, Pharmacy Records  Calcium  Carbonate-Vit D-Min (CALTRATE 600+D PLUS MINERALS) 600-800 MG-UNIT TABS [id number] Yes Take 1 tablet by mouth 3 (three) times daily.  [provider]  Active Self, Pharmacy Records  carvedilol  (COREG ) 25 MG tablet [id number] Yes Take 1 tablet (25 mg total) by mouth 2 (two) times daily with a meal. Pearlean Manus, MD  Active Self, Pharmacy Records           Med Note STEFFI, [place]   Sat Nov 07, 2023  8:25 AM) If the patients blood pressure is 120 systolic, patient takes a half a tablet.   Chlorpheniramine-Acetaminophen  (CORICIDIN HBP COLD/FLU PO) [id number]  Take 1 tablet by mouth daily as needed (cough/cold).  Patient not taking: Reported on 11/09/2023   [provider]  Active Self, Pharmacy Records  Cholecalciferol  (VITAMIN D -3) 125 MCG (5000 UT) TABS [id number] Yes Take 5,000 Units by mouth daily. [provider]  Active Self, Pharmacy Records  clopidogrel  (PLAVIX ) 75 MG tablet [id number] Yes Take 1 tablet (75 mg total) by  mouth daily. Pearlean Manus, MD  Active Self, Pharmacy Records  fish oil-omega-3 fatty acids  1000 MG capsule [id number] Yes Take 1 g by mouth daily.  [provider]  Active Self, Pharmacy Records  furosemide  (LASIX ) 40 MG tablet [id number] Yes Take 1 tablet (40 mg total) by mouth daily. Darron Deatrice LABOR, MD  Active  Self, Pharmacy Records  glucose blood (GLUCOSE METER TEST) test strip [id number] Yes Use 6-10 times a day, Uses ReliOn Lavell Bari LABOR, FNP  Active Self, Pharmacy Records  hydroxypropyl methylcellulose (ISOPTO TEARS) 2.5 % ophthalmic solution [id number] Yes Place 1 drop into both eyes 3 (three) times daily as needed (for dry eyes). [provider]  Active Self, Pharmacy Records  insulin  degludec (TRESIBA  FLEXTOUCH) 100 UNIT/ML FlexTouch Pen [id number] Yes Inject 20 Units into the skin at bedtime. Ricky Fines, MD  Active Self, Pharmacy Records  insulin  lispro (HUMALOG  [facility]) 200 UNIT/ML KwikPen [id number] Yes Inject 15-30 Units into the skin 3 (three) times daily before meals. Ricky Fines, MD  Active Self, Pharmacy Records  Insulin  Syringe 27G X 1/2 0.5 ML MISC [id number] Yes Use with Fiasp  insulin  TID Lavell Bari LABOR, FNP  Active Self, Pharmacy Records  losartan  (COZAAR ) 100 MG tablet [id number] Yes Take 1 tablet (100 mg total) by mouth daily. Sigdel, Santosh, MD  Active   montelukast  (SINGULAIR ) 10 MG tablet [id number] Yes Take 1 tablet by mouth once daily with breakfast Lavell Bari LABOR, FNP  Active Self, Pharmacy Records  nitroGLYCERIN  (NITROSTAT ) 0.4 MG SL tablet [id number] Yes Place 1 tablet (0.4 mg total) under the tongue every 5 (five) minutes as needed for chest pain. Arlice Reichert, MD  Active Self, Pharmacy Records  oxybutynin  (DITROPAN -XL) 5 MG 24 hr tablet [id number] Yes Take 1 tablet (5 mg total) by mouth at bedtime. Lavell Bari LABOR, FNP  Active Self, Pharmacy Records           Med Note STEFFI, [place]   Sat Nov 07, 2023  8:26 AM)    polyethylene glycol (MIRALAX  / GLYCOLAX ) 17 g packet [id number] Yes Take 17 g by mouth daily as needed. Ricky Fines, MD  Active Self, Pharmacy Records  potassium chloride  SA (KLOR-CON  M) 20 MEQ tablet [id number]  Take 1 tablet (20 mEq total) by mouth daily.  Patient not taking: Reported on 11/09/2023   Ricky Fines, MD  Active Self, Pharmacy  Records  RELION PEN NEEDLES 29G X MISC [id number] Yes USE 1 NEEDLE TO INJECT INSULIN  SUBCUTANEOUSLY 3 TIMES DAILY Lavell Bari LABOR, FNP  Active Self, Pharmacy Records  rosuvastatin  (CRESTOR ) 40 MG tablet [id number] Yes Take 1 tablet by mouth once daily Lavell Bari LABOR, FNP  Active Self, Pharmacy Records  Semaglutide , 2 MG/DOSE, (OZEMPIC , 2 MG/DOSE,) 8 MG/3ML SOPN [id number] Yes Inject 2 mg into the skin once a week. Lavell Bari LABOR, FNP  Active Self, Pharmacy Records            Home Care and Equipment/Supplies: Were Home Health Services Ordered?: No Any new equipment or medical supplies ordered?: No  Functional Questionnaire: Do you need assistance with bathing/showering or dressing?: No Do you need assistance with meal preparation?: No Do you need assistance with eating?: No Do you have difficulty maintaining continence: No Do you need assistance with getting out of bed/getting out of a chair/moving?: No (has walker and cane on hand if needed) Do you have difficulty managing or taking your medications?: No  Follow up appointments reviewed: PCP Follow-up appointment confirmed?: Yes Date of PCP follow-up appointment?:  11/12/23 Follow-up Provider: Bari Learn FNP Specialist Hospital Follow-up appointment confirmed?: Yes Date of Specialist follow-up appointment?: 11/24/23 Follow-Up Specialty Provider:: Dr. Darron  @ 9 am Do you need transportation to your follow-up appointment?: No Do you understand care options if your condition(s) worsen?: Yes-patient verbalized understanding  SDOH Interventions Today    Flowsheet Row Most Recent Value  SDOH Interventions   Food Insecurity Interventions Intervention Not Indicated  Housing Interventions Intervention Not Indicated  Transportation Interventions Intervention Not Indicated  Utilities Interventions Intervention Not Indicated    Goals Addressed             This Visit's Progress    VBCI Transitions of Care (TOC) Care Plan        Problems:  Recent Hospitalization for treatment of acute hypoxic respiratory failure/ hypertensive crisis Pt lives alone, has neighbors that assist her if needed,  pt is weighing daily, checking CBG and monitoring blood pressure daily Pt reports she is taking blood pressure medication as prescribed  Goal:  Over the next 30 days, the patient will not experience hospital readmission  Interventions:  Evaluation of current treatment plan related to acute hypoxic respiratory failure, self-management and patient's adherence to plan as established by provider. Discussed plans with patient for ongoing care management follow up and provided patient with direct contact information for care management team Evaluation of current treatment plan related to acute hypoxic respiratory failure and patient's adherence to plan as established by provider Reviewed medications with patient and discussed importance of taking as prescribed Reviewed scheduled/upcoming provider appointments including primary care provider 11/12/23, cardiologist Dr. Darron 11/24/23 Discussed plans with patient for ongoing care management follow up and provided patient with direct contact information for care management team Screening for signs and symptoms of depression related to chronic disease state  Assessed social determinant of health barriers Verified pt is checking BP daily, keeping a log Reviewed normal parameters for BP Reviewed action plan for hypertension, HF  Patient Self Care Activities:  Attend all scheduled provider appointments Call pharmacy for medication refills 3-7 days in advance of running out of medications Call provider office for new concerns or questions  Notify RN Care Manager of TOC call rescheduling needs Participate in Transition of Care Program/Attend TOC scheduled calls Perform all self care activities independently  Perform IADL's (shopping, preparing meals, housekeeping, managing finances)  independently Take medications as prescribed   Continue checking blood pressure, weight, blood sugar daily, keep a log and take to doctor's appointments  Plan:  Telephone follow up appointment with care management team member scheduled for:  11/16/23 @ 1030 am The patient has been provided with contact information for the care management team and has been advised to call with any health related questions or concerns.         Mliss Creed [facility], BSN RN Care Manager/ Transition of Care / [facility] [phone]

## 2023-11-11 LAB — CULTURE, BLOOD (ROUTINE X 2)
Culture: NO GROWTH
Culture: NO GROWTH
Special Requests: ADEQUATE
Special Requests: ADEQUATE

## 2023-11-12 ENCOUNTER — Encounter: Payer: Self-pay | Admitting: Family

## 2023-11-12 ENCOUNTER — Ambulatory Visit (INDEPENDENT_AMBULATORY_CARE_PROVIDER_SITE_OTHER): Admitting: Family

## 2023-11-12 ENCOUNTER — Other Ambulatory Visit: Payer: Self-pay | Admitting: *Deleted

## 2023-11-12 VITALS — BP 133/58 | HR 61 | Temp 97.3°F | Ht 62.0 in | Wt 189.6 lb

## 2023-11-12 DIAGNOSIS — E782 Mixed hyperlipidemia: Secondary | ICD-10-CM | POA: Diagnosis not present

## 2023-11-12 DIAGNOSIS — I10 Essential (primary) hypertension: Secondary | ICD-10-CM | POA: Diagnosis not present

## 2023-11-12 DIAGNOSIS — E785 Hyperlipidemia, unspecified: Secondary | ICD-10-CM | POA: Diagnosis not present

## 2023-11-12 DIAGNOSIS — I5032 Chronic diastolic (congestive) heart failure: Secondary | ICD-10-CM | POA: Diagnosis not present

## 2023-11-12 DIAGNOSIS — E1151 Type 2 diabetes mellitus with diabetic peripheral angiopathy without gangrene: Secondary | ICD-10-CM | POA: Diagnosis not present

## 2023-11-12 DIAGNOSIS — Z951 Presence of aortocoronary bypass graft: Secondary | ICD-10-CM

## 2023-11-12 DIAGNOSIS — I25708 Atherosclerosis of coronary artery bypass graft(s), unspecified, with other forms of angina pectoris: Secondary | ICD-10-CM

## 2023-11-12 DIAGNOSIS — Z09 Encounter for follow-up examination after completed treatment for conditions other than malignant neoplasm: Secondary | ICD-10-CM

## 2023-11-12 DIAGNOSIS — I739 Peripheral vascular disease, unspecified: Secondary | ICD-10-CM

## 2023-11-12 DIAGNOSIS — Z7985 Long-term (current) use of injectable non-insulin antidiabetic drugs: Secondary | ICD-10-CM

## 2023-11-12 DIAGNOSIS — J9601 Acute respiratory failure with hypoxia: Secondary | ICD-10-CM | POA: Diagnosis not present

## 2023-11-12 DIAGNOSIS — I5033 Acute on chronic diastolic (congestive) heart failure: Secondary | ICD-10-CM

## 2023-11-12 DIAGNOSIS — E1169 Type 2 diabetes mellitus with other specified complication: Secondary | ICD-10-CM | POA: Diagnosis not present

## 2023-11-12 DIAGNOSIS — I150 Renovascular hypertension: Secondary | ICD-10-CM

## 2023-11-12 DIAGNOSIS — I252 Old myocardial infarction: Secondary | ICD-10-CM

## 2023-11-12 DIAGNOSIS — E66811 Obesity, class 1: Secondary | ICD-10-CM

## 2023-11-12 DIAGNOSIS — I70223 Atherosclerosis of native arteries of extremities with rest pain, bilateral legs: Secondary | ICD-10-CM

## 2023-11-12 DIAGNOSIS — R0683 Snoring: Secondary | ICD-10-CM

## 2023-11-12 LAB — BAYER DCA HB A1C WAIVED: HB A1C (BAYER DCA - WAIVED): 7.6 % — ABNORMAL HIGH (ref 4.8–5.6)

## 2023-11-12 MED ORDER — OZEMPIC (2 MG/DOSE) 8 MG/3ML ~~LOC~~ SOPN: 2.0000 mg | PEN_INJECTOR | SUBCUTANEOUS | 0 refills | Status: DC

## 2023-11-12 MED ORDER — ROSUVASTATIN CALCIUM 40 MG PO TABS
40.0000 mg | ORAL_TABLET | Freq: Every day | ORAL | 0 refills | Status: DC
Start: 2023-11-12 — End: 2023-12-21

## 2023-11-12 NOTE — Patient Instructions (Signed)
 Heart Failure: How to Manage Heart failure is a long-term condition where your heart can't pump enough blood through your body. When this happens, parts of your body don't get the blood and oxygen they need. There's no cure for heart failure, so it's important to take good care of yourself and follow the treatment plan you set with your health care provider. If you're living with heart failure, there are ways to help you manage the disease. How to manage lifestyle changes Living with heart failure requires you to make changes in your daily life. Your health care team will teach you about the changes you need to make. These changes can help improve your symptoms and lower your risk of going to the hospital. Work with your provider to create a plan for you. Activity Ask your provider about cardiac rehabilitation programs. These programs include physical activity. If no cardiac rehab program is available, ask your provider what exercises are safe for you to do. Ask what things are safe for you to do at home. Ask when you can go back to work or school. Pace your daily activities and allow time for rest. Managing stress It's normal to have many emotions when you're told you have heart failure. You may have fear, sadness, and anger. If you need help coping with any of these emotions, let your provider know. Here are some ways to help yourself manage these emotions: Talk to your friends and family about your condition. They can give you support and guidance. Explain your symptoms to them. If comfortable, you can invite them to attend appointments with you. Join a support group for people with heart failure. Talking with others who have the same symptoms may give you new ways of coping with your disease and your emotions. Accept help from others. Do not be ashamed if you need help. Use stress management techniques. Try things like meditation, breathing exercises, or listening to relaxing music. Conditions  like depression and anxiety are common in people with heart failure. Pay attention to changes in your mood, emotions, and stress levels. Tell your provider if you have any of the following symptoms: Trouble sleeping or a change in your sleeping patterns. Feeling sad or depressed. Losing interest in activities you normally enjoy. Feeling irritable or crying for no reason. Often worrying about the future. Work If heart failure affects your ability to work, you may need to talk with your provider about making a plan for changes. This may include: Reducing your work hours. Finding tasks that require less effort. Planning rest periods during work hours. Travel Talk with your provider if you plan to travel. There may be times when your provider suggests that you don't travel or you wait until your condition has improved. Bring your medicine and a list of your medicines. If you're traveling by public transportation (airplane, train, bus), keep your medicines with you in a carry-on bag. If you have special needs, contact the transportation company prior to traveling. This may include needs related to diet, oxygen, a wheelchair, a seating request, or help with luggage. Think about finding a medical facility in the area you'll be traveling to. Find out what your health insurance will cover. If you use oxygen, make sure you bring enough oxygen with you. If you have a battery-powered oxygen device, bring a fully charged extra battery with you. If you have an implanted device, bring a note from your provider and tell the security screening workers that you have the device. You may  need to go through special screening. Sexual activity  Ask your provider when it's safe for you to resume sexual activity. You may need to start slowly and increase intimacy over time. Regular exercise can benefit your sex life by building strength and endurance. Sleep If your condition affects your sleep, find ways to improve  how well you sleep at night. These tips may help: Sleep lying on your side, or sleep with your head raised. Try: Raising the head of your bed. Using more than one pillow. Ask your provider about screening for sleep apnea. Try to go to sleep and wake up at the same times every day. Sleep in a dark, cool room. Do not exercise or eat for a few hours before bedtime. Plan rest periods during the day. But don't take long naps.  Where to find support In addition to talking with your family or friends, consider talking with: A mental health professional or therapist. A member of your church, faith, or community group. Other sources of support include: Local support groups. Ask your provider about groups near you. Online support groups, such as those found through: Heart Failure Society of America: hfsa.org American Heart Association: supportnetwork.heart.org Local community agencies or social agencies. A palliative care specialist. Palliative care can help manage symptoms, promote comfort, improve quality of life, and maintain dignity. Where to find more information To learn more, go to these websites: Centers for Disease Control and Prevention at TonerPromos.no. Then: Click Health Topics. Type "heart failure" in the search box. American Heart Association: heart.org National Heart, Lung, and Blood Institute: BuffaloDryCleaner.gl Contact a health care provider if: You have trouble breathing when you're active. You have swelling in your feet or legs. You gain 2-3 lb (0.9-1.4 kg) in 24 hours, or 5 lb (2.3 kg) in a week. You have a dry cough. You're not able to take part in your usual physical activities. You feel dizzy or light-headed when you stand up. You have trouble sleeping. You have a decrease in appetite. You have symptoms of depression or anxiety. Get help right away if: You have trouble breathing when resting. You have trouble staying awake or you feel confused. You have chest pain. You  faint. You have fast or irregular heartbeats. These symptoms may be an emergency. Call 911 right away. Do not wait to see if the symptoms will go away. Do not drive yourself to the hospital. This information is not intended to replace advice given to you by your health care provider. Make sure you discuss any questions you have with your health care provider. Document Revised: 09/18/2022 Document Reviewed: 09/18/2022 Elsevier Patient Education  2024 [employer].

## 2023-11-12 NOTE — Progress Notes (Signed)
 Subjective:    Patient ID: Leah Ferrell, female    DOB: 11/06/1950, 73 y.o.   MRN: [id number]  Chief Complaint  Patient presents with   Medical Management of Chronic Issues   Pt presents to the office today for chronic follow up and transitions of care.   Today's visit was for Transitional Care Management.  The patient was discharged from [facility] on 11/08/23 with a primary diagnosis of acute respiratory failure with hypoxia.   Contact with the patient and/or caregiver, by a clinical staff member, was made on 11/09/23 and was documented as a telephone encounter within the EMR.  Through chart review and discussion with the patient I have determined that management of their condition is of high complexity.   She went to the ED with elevated BP and hypoxia. Found to have elevated troponins. Per hospital notes, Her symptoms and elevated troponins were felt to be secondary to hypertensive urgency and flash pulmonary edema.  She was diuresed with IV Lasix  with good response.  Of note, she was prescribed Lasix  but was not taking Lasix  at home.  She was placed on IV nitroglycerin  drip and dose of losartan  increased to 100 mg daily for better control of blood pressures.    Pt has CHF, CAD and hx MI and is followed by Cardiologists every 3 months.  Echo on 11/07/23 with EF of 55-60 %.  PT is followed by Vascular  for PVD and PAD. PT states these are stable.    Pt had a renal angiography on 03/16/19.    She had critical lower limb ischemia and had abdominal aortogram with lower extremity. 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    Pt is obese because her BMI is  34 and has DM and HTN.   Pt snoring and having fatigue. Hospital requests sleep study.  Hypertension This is a chronic problem. The current episode started more than 1 year ago. The problem has been resolved since onset. The problem is controlled. Associated symptoms include malaise/fatigue, peripheral edema (when staying too long) and  shortness of breath. Pertinent negatives include no blurred vision. Risk factors for coronary artery disease include diabetes mellitus, dyslipidemia, obesity and sedentary lifestyle. The current treatment provides moderate improvement. Hypertensive end-organ damage includes PVD.  Diabetes She presents for her follow-up diabetic visit. She has type 2 diabetes mellitus. Associated symptoms include fatigue and foot paresthesias. Pertinent negatives for diabetes include no blurred vision. Symptoms are stable. Diabetic complications include peripheral neuropathy and PVD. Risk factors for coronary artery disease include diabetes mellitus, dyslipidemia, hypertension, sedentary lifestyle and post-menopausal. She is following a generally unhealthy diet. Her overall blood glucose range is 180-200 mg/dl. Eye exam is current.  Congestive Heart Failure Presents for follow-up visit. Associated symptoms include edema, fatigue and shortness of breath. Pertinent negatives include no nocturia. The symptoms have been stable.      Review of Systems  Constitutional:  Positive for fatigue and malaise/fatigue.  Eyes:  Negative for blurred vision.  Respiratory:  Positive for shortness of breath.   Genitourinary:  Negative for nocturia.  All other systems reviewed and are negative.  Family History  Problem Relation Age of Onset   Diabetes Mother    Hyperlipidemia Mother    Hypertension Mother    Peripheral vascular disease Mother    Other Sister        drowned   Other Sister        drowned   Diabetes Daughter  borderline   Hypertension Daughter    Diabetes Maternal Grandmother    Heart attack Maternal Grandfather    Heart disease Brother    Diabetes Brother    Alcohol  abuse Brother    Diabetes Son    Hypertension Son    Hyperlipidemia Son    Breast cancer Neg Hx    Social History   Socioeconomic History   Marital status: Widowed    Spouse name: Not on file   Number of children: 4   Years of  education: completed school in [place]    Highest education level: Not on file  Occupational History   Occupation: retired  Tobacco Use   Smoking status: Former    Current packs/day: 0.00    Average packs/day: 1 pack/day for 36.8 years (36.8 ttl pk-yrs)    Types: Cigarettes    Start date: 04/21/1964    Quit date: 02/17/2001    Years since quitting: 22.7   Smokeless tobacco: Never  Vaping Use   Vaping status: Never Used  Substance and Sexual Activity   Alcohol  use: No    Alcohol /week: 0.0 standard drinks of alcohol    Drug use: No   Sexual activity: Not Currently    Birth control/protection: Post-menopausal  Other Topics Concern   Not on file  Social History Narrative   Lives alone - one level - all of her children live far away   Social Drivers of Health   Financial Resource Strain: Medium Risk (08/17/2023)   Overall Financial Resource Strain (CARDIA)    Difficulty of Paying Living Expenses: Somewhat hard  Food Insecurity: No Food Insecurity (11/09/2023)   Hunger Vital Sign    Worried About Running Out of Food in the Last Year: Never true    Ran Out of Food in the Last Year: Never true  Transportation Needs: No Transportation Needs (11/09/2023)   PRAPARE - Administrator, Civil Service (Medical): No    Lack of Transportation (Non-Medical): No  Physical Activity: Insufficiently Active (12/25/2022)   Exercise Vital Sign    Days of Exercise per Week: 3 days    Minutes of Exercise per Session: 30 min  Stress: No Stress Concern Present (12/25/2022)   [employer] of Occupational Health - Occupational Stress Questionnaire    Feeling of Stress : Not at all  Social Connections: Moderately Isolated (08/15/2023)   Social Connection and Isolation Panel    Frequency of Communication with Friends and Family: More than three times a week    Frequency of Social Gatherings with Friends and Family: More than three times a week    Attends Religious Services: 1 to 4 times per  year    Active Member of [employer] or Organizations: No    Attends Banker Meetings: Never    Marital Status: Widowed       Objective:   Physical Exam Vitals reviewed.  Constitutional:      General: She is not in acute distress.    Appearance: She is well-developed. She is obese.  HENT:     Head: Normocephalic and atraumatic.     Right Ear: Tympanic membrane normal.     Left Ear: Tympanic membrane normal.  Eyes:     Pupils: Pupils are equal, round, and reactive to light.  Neck:     Thyroid : No thyromegaly.  Cardiovascular:     Rate and Rhythm: Normal rate and regular rhythm.     Heart sounds: Normal heart sounds. No murmur heard. Pulmonary:  Effort: Pulmonary effort is normal. No respiratory distress.     Breath sounds: Normal breath sounds. No wheezing.  Abdominal:     General: Bowel sounds are normal. There is no distension.     Palpations: Abdomen is soft.     Tenderness: There is no abdominal tenderness.  Musculoskeletal:        General: No tenderness. Normal range of motion.     Cervical back: Normal range of motion and neck supple.     Right lower leg: No edema.     Left lower leg: No edema.  Skin:    General: Skin is warm and dry.  Neurological:     Mental Status: She is alert and oriented to person, place, and time.     Cranial Nerves: No cranial nerve deficit.     Deep Tendon Reflexes: Reflexes are normal and symmetric.  Psychiatric:        Behavior: Behavior normal.        Thought Content: Thought content normal.        Judgment: Judgment normal.       BP (!) 133/58   Pulse 61   Temp (!) 97.3 F (36.3 C) (Temporal)   Ht 5' 2 (1.575 m)   Wt 189 lb 9.6 oz (86 kg)   SpO2 96%   BMI 34.68 kg/m      Assessment & Plan:   Leah Ferrell comes in today with chief complaint of Medical Management of Chronic Issues   Diagnosis and orders addressed: 1. Hyperlipidemia associated with type 2 diabetes mellitus (HCC) - rosuvastatin   (CRESTOR ) 40 MG tablet; Take 1 tablet (40 mg total) by mouth daily.  Dispense: 90 tablet; Refill: 0 - CMP14+EGFR - CBC with Differential/Platelet  2. Diabetes mellitus with peripheral vascular disease (HCC) - Semaglutide , 2 MG/DOSE, (OZEMPIC , 2 MG/DOSE,) 8 MG/3ML SOPN; Inject 2 mg into the skin once a week.  Dispense: 3 mL; Refill: 0 - CMP14+EGFR - CBC with Differential/Platelet - Bayer DCA Hb A1c Waived  3. Atherosclerosis of native artery of both lower extremities with rest pain (HCC) - CMP14+EGFR - CBC with Differential/Platelet  4. Essential hypertension (Primary) - CMP14+EGFR - CBC with Differential/Platelet  5. Mixed hyperlipidemia  - CMP14+EGFR - CBC with Differential/Platelet  6. Hx of non-ST elevation myocardial infarction (NSTEMI) - CMP14+EGFR - CBC with Differential/Platelet  7. 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 Chronic diastolic heart failure (HCC) - CMP14+EGFR - CBC with Differential/Platelet  9. Acute on chronic diastolic CHF (congestive heart failure) (HCC) - CMP14+EGFR - CBC with Differential/Platelet  10. S/P CABG x 3 - CMP14+EGFR - CBC with Differential/Platelet  11. Obesity (BMI 30.0-34.9) - CMP14+EGFR - CBC with Differential/Platelet  12. PVD (peripheral vascular disease) (HCC) - CMP14+EGFR - CBC with Differential/Platelet  13. Hospital discharge follow-up  14. Snoring - Ambulatory referral to Sleep Studies   Labs pending Continue current medications, continue to monitor BP at home.  Referral to sleep study Handicap form completed  Keep specialists follow up Keep all follow up with specialists  Health Maintenance reviewed Diet and exercise encouraged  Follow up plan: 3 months    Bari Learn, FNP

## 2023-11-13 ENCOUNTER — Telehealth: Payer: Self-pay | Admitting: Family

## 2023-11-13 ENCOUNTER — Ambulatory Visit: Payer: Self-pay | Admitting: Family

## 2023-11-13 LAB — CBC WITH DIFFERENTIAL/PLATELET
Basophils Absolute: 0.1 x10E3/uL (ref 0.0–0.2)
Basos: 1 %
EOS (ABSOLUTE): 0.1 x10E3/uL (ref 0.0–0.4)
Eos: 1 %
Hematocrit: 42.7 % (ref 34.0–46.6)
Hemoglobin: 13.6 g/dL (ref 11.1–15.9)
Immature Grans (Abs): 0 x10E3/uL (ref 0.0–0.1)
Immature Granulocytes: 0 %
Lymphocytes Absolute: 2.5 x10E3/uL (ref 0.7–3.1)
Lymphs: 25 %
MCH: 31.2 pg (ref 26.6–33.0)
MCHC: 31.9 g/dL (ref 31.5–35.7)
MCV: 98 fL — ABNORMAL HIGH (ref 79–97)
Monocytes Absolute: 0.6 x10E3/uL (ref 0.1–0.9)
Monocytes: 6 %
Neutrophils Absolute: 6.8 x10E3/uL (ref 1.4–7.0)
Neutrophils: 67 %
Platelets: 256 x10E3/uL (ref 150–450)
RBC: 4.36 x10E6/uL (ref 3.77–5.28)
RDW: 14.5 % (ref 11.7–15.4)
WBC: 10.1 x10E3/uL (ref 3.4–10.8)

## 2023-11-13 LAB — CMP14+EGFR
ALT: 33 IU/L — AB (ref 0–32)
AST: 28 IU/L (ref 0–40)
Albumin: 4.2 g/dL (ref 3.8–4.8)
Alkaline Phosphatase: 61 IU/L (ref 49–135)
BUN/Creatinine Ratio: 16 (ref 12–28)
BUN: 16 mg/dL (ref 8–27)
Bilirubin Total: 0.3 mg/dL (ref 0.0–1.2)
CO2: 28 mmol/L (ref 20–29)
Calcium: 10 mg/dL (ref 8.7–10.3)
Chloride: 93 mmol/L — AB (ref 96–106)
Creatinine, Ser: 1.03 mg/dL — AB (ref 0.57–1.00)
Globulin, Total: 3.7 g/dL (ref 1.5–4.5)
Glucose: 183 mg/dL — AB (ref 70–99)
Potassium: 4.7 mmol/L (ref 3.5–5.2)
Sodium: 136 mmol/L (ref 134–144)
Total Protein: 7.9 g/dL (ref 6.0–8.5)
eGFR: 58 mL/min/1.73 — AB (ref >59)

## 2023-11-13 NOTE — Telephone Encounter (Signed)
 Called patient she states her BP is staying around 113/61  and 109/60. She is not taking her BP meds when  it is like this.

## 2023-11-13 NOTE — Telephone Encounter (Signed)
 Copied from CRM [id number]. Topic: Clinical - Refused Triage >> Nov 13, 2023  1:27 PM Yolanda T wrote: Patient/caller voiced complaints of lowe blood pressure Declined transfer to triage. Patient called stated she did not want to tell me everything and have to repeat herself but she wanted Rosina Bari Learn nurse to know her blood pressure was low and she wanted to know if she should take her medication. I asked what her numbers were and she refused to tell me. Please f/u with patient

## 2023-11-16 ENCOUNTER — Telehealth: Payer: Self-pay

## 2023-11-16 ENCOUNTER — Other Ambulatory Visit: Payer: Self-pay | Admitting: *Deleted

## 2023-11-16 NOTE — Patient Instructions (Signed)
 Visit Information  Thank you for taking time to visit with me today. Please don't hesitate to contact me if I can be of assistance to you before our next scheduled telephone appointment.  Our next appointment is by telephone on 11/23/23 @ 1030 am  Following is a copy of your care plan:   Goals Addressed             This Visit's Progress    VBCI Transitions of Care (TOC) Care Plan       Problems:  Recent Hospitalization for treatment of acute hypoxic respiratory failure/ hypertensive crisis Pt lives alone, has neighbors that assist her if needed,  pt is weighing daily, checking CBG and monitoring blood pressure daily Pt reports she is taking blood pressure medication as prescribed 11/16/23- pt states she is doing well, feeling stronger, continues checking blood pressure, weight, blood sugar and keeping a log, pt saw primary care provider on 11/12/23  Goal:  Over the next 30 days, the patient will not experience hospital readmission  Interventions:  Evaluation of current treatment plan related to acute hypoxic respiratory failure, self-management and patient's adherence to plan as established by provider. Discussed plans with patient for ongoing care management follow up and provided patient with direct contact information for care management team Evaluation of current treatment plan related to acute hypoxic respiratory failure and patient's adherence to plan as established by provider Reviewed medications with patient and discussed importance of taking as prescribed Reviewed scheduled/upcoming provider appointments including cardiologist Dr. Darron 11/24/23 Discussed plans with patient for ongoing care management follow up and provided patient with direct contact information for care management team Verified pt is checking BP daily, keeping a log Reinforced normal parameters for BP Reinforced action plan for hypertension, HF  Patient Self Care Activities:  Attend all scheduled provider  appointments Call pharmacy for medication refills 3-7 days in advance of running out of medications Call provider office for new concerns or questions  Notify RN Care Manager of TOC call rescheduling needs Participate in Transition of Care Program/Attend TOC scheduled calls Perform all self care activities independently  Perform IADL's (shopping, preparing meals, housekeeping, managing finances) independently Take medications as prescribed   Continue checking blood pressure, weight, blood sugar daily, keep a log and take to doctor's appointments  Plan:  Telephone follow up appointment with care management team member scheduled for:  11/23/23 @ 1030 am The patient has been provided with contact information for the care management team and has been advised to call with any health related questions or concerns.         The patient verbalized understanding of instructions, educational materials, and care plan provided today and DECLINED offer to receive copy of patient instructions, educational materials, and care plan.   Telephone follow up appointment with care management team member scheduled for: 11/23/23 @ 1030 am  Please call the care guide team at [phone] if you need to cancel or reschedule your appointment.   Please call the Suicide and Crisis Lifeline: 988 call the [place]  National Suicide Prevention Lifeline: (916)011-6121 or TTY: 912-131-7517 TTY 831 404 2948) to talk to a trained counselor call 1-800-273-TALK (toll free, 24 hour hotline) go to [facility] [address] [place] [phone]) call the [facility] Crisis Line: [phone] call 911 if you are experiencing a Mental Health or Behavioral Health Crisis or need someone to talk to.  Mliss Creed [facility], BSN RN Care Manager/ Transition of Care [place]/ [facility] [phone]

## 2023-11-16 NOTE — Telephone Encounter (Signed)
 Tried calling patient to make her aware that her medication order of TRESIBA  is here at the office and needs to be picked up asap. No answer and voicemail full.

## 2023-11-16 NOTE — Patient Outreach (Signed)
 Transition of Care week 2  Visit Note  11/16/2023  Name: Leah Ferrell MRN: [id number]          DOB: [birth year]  Situation: Patient enrolled in [facility] 30-day program. Visit completed with patient by telephone.   Background:    Past Medical History:  Diagnosis Date   CAD (coronary artery disease)    a. 04/2012 NSTEMI: s/p DES to LAD.   Chronic diastolic CHF (congestive heart failure) (HCC)    Diabetes mellitus without complication (HCC)    Dysrhythmia    Environmental and seasonal allergies    Hyperlipidemia    Hypertension    Leukocytosis    Morbid obesity (HCC)    NSTEMI (non-ST elevated myocardial infarction) (HCC) 04/27/2012   PONV (postoperative nausea and vomiting)    PVD (peripheral vascular disease)    a. 04/2012 ABI: R 0.49, L 0.39. Angiography 06/14: Significant ostial left common iliac artery stenosis extending into the distal aorta, severe left common femoral artery stenosis, bilateral SFA occlusion with heavy calcifications. Functionally, one-vessel runoff bilaterally below the knee   Renal artery stenosis    s/p angioplasty/stenting bilateral renal arteries 03/16/19   Shortness of breath     Assessment: Patient Reported Symptoms: Cognitive Cognitive Status: No symptoms reported, Alert and oriented to person, place, and time, Normal speech and language skills, Able to follow simple commands      Neurological Neurological Review of Symptoms: No symptoms reported    HEENT HEENT Symptoms Reported: No symptoms reported      Cardiovascular Cardiovascular Symptoms Reported: No symptoms reported Does patient have uncontrolled Hypertension?: Yes Is patient checking Blood Pressure at home?: Yes Patient's Recent BP reading at home: has not checked BP this morning,  last 3 readings 105/56, 115/64, 123/66, 138/63 Cardiovascular Management Strategies: Medication therapy Weight: 188 lb (85.3 kg) Cardiovascular Self-Management Outcome: 4 (good) Cardiovascular Comment:  Reinforced importance of checking BP daily, keeping a log, reinforced low sodium diet, pt has nitroglycerin  on hand  Respiratory Respiratory Symptoms Reported: No symptoms reported    Endocrine Endocrine Symptoms Reported: No symptoms reported Is patient diabetic?: Yes Is patient checking blood sugars at home?: Yes List most recent blood sugar readings, include date and time of day: 3-4 x per day,   FBS today 186 Endocrine Self-Management Outcome: 4 (good) Endocrine Comment: Reinforced carbohydrate modified diet  Gastrointestinal Gastrointestinal Symptoms Reported: No symptoms reported      Genitourinary Genitourinary Symptoms Reported: No symptoms reported    Integumentary Integumentary Symptoms Reported: No symptoms reported    Musculoskeletal Musculoskelatal Symptoms Reviewed: No symptoms reported        Psychosocial Psychosocial Symptoms Reported: No symptoms reported         Vitals:   11/16/23 1052  BP: 138/63    Medications Reviewed Today     Reviewed by Aura Mliss LABOR, RN (Registered Nurse) on 11/16/23 at 1051  Med List Status: <None>   Medication Order Taking? Sig Documenting Provider Last Dose Status Informant  acetaminophen  (TYLENOL ) 325 MG tablet [id number]  Take 2 tablets (650 mg total) by mouth every 6 (six) hours as needed for mild pain (pain score 1-3) (or Fever >/= 101). Pearlean Manus, MD  Active Self, Pharmacy Records  albuterol  (VENTOLIN  HFA) 108 [phone] Base) MCG/ACT inhaler [id number]  Inhale 2 puffs into the lungs every 6 (six) hours as needed for shortness of breath or wheezing. Pearlean Manus, MD  Active Self, Pharmacy Records  Ascorbic Acid  (VITAMIN C) 1000 MG tablet [id number]  Take 1,000 mg  by mouth 3 (three) times daily.  [provider]  Active Self, Pharmacy Records  aspirin  EC 81 MG tablet [id number]  Take 1 tablet (81 mg total) by mouth daily with breakfast. In the morning Pearlean Manus, MD  Active Self, Pharmacy Records  BD ULTRA-FINE  PEN NEEDLES 29G X 12.7MM MISC [id number]  USE PENNEEDLES 3 TIMES  DAILY AS DIRECTED Lavell Bari LABOR, FNP  Active Self, Pharmacy Records  blood glucose meter kit and supplies [id number]  Dispense based on patient and insurance preference. Use up to four times daily as directed. (FOR ICD-10 E10.9, E11.9). Lavell Bari LABOR, FNP  Active Self, Pharmacy Records  Calcium  Carbonate-Vit D-Min (CALTRATE 600+D PLUS MINERALS) 600-800 MG-UNIT TABS [id number]  Take 1 tablet by mouth 3 (three) times daily.  [provider]  Active Self, Pharmacy Records  carvedilol  (COREG ) 25 MG tablet [id number]  Take 1 tablet (25 mg total) by mouth 2 (two) times daily with a meal. Pearlean Manus, MD  Expired 11/12/23 2359 Self, Pharmacy Records           Med Note STEFFI, [place]   Sat Nov 07, 2023  8:25 AM) If the patients blood pressure is 120 systolic, patient takes a half a tablet.   Cholecalciferol  (VITAMIN D -3) 125 MCG (5000 UT) TABS [id number]  Take 5,000 Units by mouth daily. [provider]  Active Self, Pharmacy Records  clopidogrel  (PLAVIX ) 75 MG tablet [id number]  Take 1 tablet (75 mg total) by mouth daily. Pearlean Manus, MD  Active Self, Pharmacy Records  fish oil-omega-3 fatty acids  1000 MG capsule [id number]  Take 1 g by mouth daily.  [provider]  Active Self, Pharmacy Records  furosemide  (LASIX ) 40 MG tablet [id number]  Take 1 tablet (40 mg total) by mouth daily. Darron Deatrice LABOR, MD  Active Self, Pharmacy Records  glucose blood (GLUCOSE METER TEST) test strip [id number]  Use 6-10 times a day, Uses [employer], Bari LABOR, FNP  Active Self, Pharmacy Records  hydroxypropyl methylcellulose (ISOPTO TEARS) 2.5 % ophthalmic solution [id number]  Place 1 drop into both eyes 3 (three) times daily as needed (for dry eyes). [provider]  Active Self, Pharmacy Records  insulin  degludec (TRESIBA  FLEXTOUCH) 100 UNIT/ML FlexTouch Pen [id number]  Inject 20 Units into the skin at bedtime.  Ricky Fines, MD  Active Self, Pharmacy Records  insulin  lispro (HUMALOG  [facility]) 200 UNIT/ML KwikPen [id number]  Inject 15-30 Units into the skin 3 (three) times daily before meals. Ricky Fines, MD  Active Self, Pharmacy Records  Insulin  Syringe 27G X 1/2 0.5 ML MISC [id number]  Use with Fiasp  insulin  TID Lavell Bari LABOR, FNP  Active Self, Pharmacy Records  losartan  (COZAAR ) 100 MG tablet [id number]  Take 1 tablet (100 mg total) by mouth daily. Sigdel, Santosh, MD  Active   montelukast  (SINGULAIR ) 10 MG tablet [id number]  Take 1 tablet by mouth once daily with breakfast Lavell Bari A, FNP  Active   nitroGLYCERIN  (NITROSTAT ) 0.4 MG SL tablet [id number]  Place 1 tablet (0.4 mg total) under the tongue every 5 (five) minutes as needed for chest pain. Arlice Reichert, MD  Active Self, Pharmacy Records  oxybutynin  (DITROPAN -XL) 5 MG 24 hr tablet [id number]  Take 1 tablet (5 mg total) by mouth at bedtime. Lavell Bari LABOR, FNP  Active Self, Pharmacy Records           Med Note STEFFI, [place]A   Sat Nov 07, 2023  8:26 AM)    polyethylene glycol (MIRALAX  / GLYCOLAX ) 17  g packet [id number]  Take 17 g by mouth daily as needed. Ricky Fines, MD  Active Self, Pharmacy Records  potassium chloride  SA (KLOR-CON  M) 20 MEQ tablet [id number]  Take 1 tablet (20 mEq total) by mouth daily.  Patient not taking: Reported on 11/12/2023   Ricky Fines, MD  Active Self, Pharmacy Records  RELION PEN NEEDLES 29G X MISC [id number]  USE 1 NEEDLE TO INJECT INSULIN  SUBCUTANEOUSLY 3 TIMES DAILY Lavell Bari LABOR, FNP  Active Self, Pharmacy Records  rosuvastatin  (CRESTOR ) 40 MG tablet [id number]  Take 1 tablet (40 mg total) by mouth daily. Lavell Bari A, FNP  Active   Semaglutide , 2 MG/DOSE, (OZEMPIC , 2 MG/DOSE,) 8 MG/3ML SOPN [id number]  Inject 2 mg into the skin once a week. Lavell Bari LABOR, FNP  Active              Goals Addressed             This Visit's Progress    VBCI Transitions of Care (TOC) Care  Plan       Problems:  Recent Hospitalization for treatment of acute hypoxic respiratory failure/ hypertensive crisis Pt lives alone, has neighbors that assist her if needed,  pt is weighing daily, checking CBG and monitoring blood pressure daily Pt reports she is taking blood pressure medication as prescribed 11/16/23- pt states she is doing well, feeling stronger, continues checking blood pressure, weight, blood sugar and keeping a log, pt saw primary care provider on 11/12/23  Goal:  Over the next 30 days, the patient will not experience hospital readmission  Interventions:  Evaluation of current treatment plan related to acute hypoxic respiratory failure, self-management and patient's adherence to plan as established by provider. Discussed plans with patient for ongoing care management follow up and provided patient with direct contact information for care management team Evaluation of current treatment plan related to acute hypoxic respiratory failure and patient's adherence to plan as established by provider Reviewed medications with patient and discussed importance of taking as prescribed Reviewed scheduled/upcoming provider appointments including cardiologist Dr. Darron 11/24/23 Discussed plans with patient for ongoing care management follow up and provided patient with direct contact information for care management team Verified pt is checking BP daily, keeping a log Reinforced normal parameters for BP Reinforced action plan for hypertension, HF  Patient Self Care Activities:  Attend all scheduled provider appointments Call pharmacy for medication refills 3-7 days in advance of running out of medications Call provider office for new concerns or questions  Notify RN Care Manager of TOC call rescheduling needs Participate in Transition of Care Program/Attend TOC scheduled calls Perform all self care activities independently  Perform IADL's (shopping, preparing meals, housekeeping,  managing finances) independently Take medications as prescribed   Continue checking blood pressure, weight, blood sugar daily, keep a log and take to doctor's appointments  Plan:  Telephone follow up appointment with care management team member scheduled for:  11/23/23 @ 1030 am The patient has been provided with contact information for the care management team and has been advised to call with any health related questions or concerns.         Recommendation:   PCP Follow-up Specialty provider follow-up cardiologist 11/24/23  Follow Up Plan:   Telephone follow-up 11/23/23 @ 1030 am  Mliss Creed [facility], BSN RN Care Manager/ Transition of Care [place]/ [facility] [phone]

## 2023-11-17 ENCOUNTER — Telehealth: Payer: Self-pay

## 2023-11-17 DIAGNOSIS — M79675 Pain in left toe(s): Secondary | ICD-10-CM | POA: Diagnosis not present

## 2023-11-17 DIAGNOSIS — L84 Corns and callosities: Secondary | ICD-10-CM | POA: Diagnosis not present

## 2023-11-17 DIAGNOSIS — M79674 Pain in right toe(s): Secondary | ICD-10-CM | POA: Diagnosis not present

## 2023-11-17 DIAGNOSIS — B351 Tinea unguium: Secondary | ICD-10-CM | POA: Diagnosis not present

## 2023-11-17 DIAGNOSIS — E1142 Type 2 diabetes mellitus with diabetic polyneuropathy: Secondary | ICD-10-CM | POA: Diagnosis not present

## 2023-11-17 NOTE — Telephone Encounter (Addendum)
 In process of completing Novo Nordisk refills for patients OZEMPIC  2MG  PENS medication.  Application emailed to JULIE P for signature.

## 2023-11-23 ENCOUNTER — Other Ambulatory Visit: Payer: Self-pay | Admitting: *Deleted

## 2023-11-23 NOTE — Patient Instructions (Signed)
 Visit Information  Thank you for taking time to visit with me today. Please don't hesitate to contact me if I can be of assistance to you before our next scheduled telephone appointment.  Our next appointment is by telephone on 11/30/23@ 1030 am  Following is a copy of your care plan:   Goals Addressed             This Visit's Progress    VBCI Transitions of Care (TOC) Care Plan       Problems:  Recent Hospitalization for treatment of acute hypoxic respiratory failure/ hypertensive crisis Pt lives alone, has neighbors that assist her if needed,  pt is weighing daily, checking CBG and monitoring blood pressure daily Pt reports she is taking blood pressure medication as prescribed 11/23/23- pt states she is doing well, feeling stronger, continues checking blood pressure, weight, blood sugar, oxygen saturation and keeping a log  Goal:  Over the next 30 days, the patient will not experience hospital readmission  Interventions:  Evaluation of current treatment plan related to acute hypoxic respiratory failure, self-management and patient's adherence to plan as established by provider. Discussed plans with patient for ongoing care management follow up and provided patient with direct contact information for care management team Evaluation of current treatment plan related to acute hypoxic respiratory failure and patient's adherence to plan as established by provider Reviewed medications with patient and discussed importance of taking as prescribed Reviewed scheduled/upcoming provider appointments including cardiologist Dr. Darron 11/24/23 Discussed plans with patient for ongoing care management follow up and provided patient with direct contact information for care management team Verified pt is checking BP daily, keeping a log Reinforced normal parameters for BP Reviewed action plan for hypertension, HF  Patient Self Care Activities:  Attend all scheduled provider appointments Call  pharmacy for medication refills 3-7 days in advance of running out of medications Call provider office for new concerns or questions  Notify RN Care Manager of TOC call rescheduling needs Participate in Transition of Care Program/Attend TOC scheduled calls Perform all self care activities independently  Perform IADL's (shopping, preparing meals, housekeeping, managing finances) independently Take medications as prescribed   Continue checking blood pressure, weight, blood sugar daily, keep a log and take to doctor's appointments  Plan:  Telephone follow up appointment with care management team member scheduled for:  11/30/23 @ 1030 am The patient has been provided with contact information for the care management team and has been advised to call with any health related questions or concerns.         The patient verbalized understanding of instructions, educational materials, and care plan provided today and DECLINED offer to receive copy of patient instructions, educational materials, and care plan.   Telephone follow up appointment with care management team member scheduled for:  11/30/23 @ 1030 am  Please call the care guide team at [phone] if you need to cancel or reschedule your appointment.   Please call the Suicide and Crisis Lifeline: 988 call the [place]  National Suicide Prevention Lifeline: (660)663-0280 or TTY: 914-586-0839 TTY 858 675 2525) to talk to a trained counselor call 1-800-273-TALK (toll free, 24 hour hotline) go to [facility] [address], [place] [phone]) call the [facility] Crisis Line: 231-239-7252 call 911 if you are experiencing a Mental Health or Behavioral Health Crisis or need someone to talk to.  Mliss Creed [facility], BSN RN Care Manager/ Transition of Care [place]/ [facility] [phone]

## 2023-11-23 NOTE — Patient Outreach (Signed)
 Transition of Care week 3  Visit Note  11/23/2023  Name: Leah Ferrell MRN: [id number]          DOB: April 24, 1950  Situation: Patient enrolled in [facility] 30-day program. Visit completed with patient by telephone.   Background:    Past Medical History:  Diagnosis Date   CAD (coronary artery disease)    a. 04/2012 NSTEMI: s/p DES to LAD.   Chronic diastolic CHF (congestive heart failure) (HCC)    Diabetes mellitus without complication (HCC)    Dysrhythmia    Environmental and seasonal allergies    Hyperlipidemia    Hypertension    Leukocytosis    Morbid obesity (HCC)    NSTEMI (non-ST elevated myocardial infarction) (HCC) 04/27/2012   PONV (postoperative nausea and vomiting)    PVD (peripheral vascular disease)    a. 04/2012 ABI: R 0.49, L 0.39. Angiography 06/14: Significant ostial left common iliac artery stenosis extending into the distal aorta, severe left common femoral artery stenosis, bilateral SFA occlusion with heavy calcifications. Functionally, one-vessel runoff bilaterally below the knee   Renal artery stenosis    s/p angioplasty/stenting bilateral renal arteries 03/16/19   Shortness of breath     Assessment: Patient Reported Symptoms: Cognitive Cognitive Status: No symptoms reported, Alert and oriented to person, place, and time, Normal speech and language skills, Able to follow simple commands      Neurological Neurological Review of Symptoms: No symptoms reported    HEENT HEENT Symptoms Reported: No symptoms reported      Cardiovascular Cardiovascular Symptoms Reported: No symptoms reported Is patient checking Blood Pressure at home?: Yes Patient's Recent BP reading at home: 148/65 Cardiovascular Management Strategies: Medication therapy, Routine screening Weight: 189 lb (85.7 kg) Cardiovascular Self-Management Outcome: 4 (good) Cardiovascular Comment: Reviewed importance of checking BP daily, keeping a log, reviewed low sodium diet  Respiratory Respiratory  Symptoms Reported: No symptoms reported    Endocrine Endocrine Symptoms Reported: No symptoms reported Is patient diabetic?: Yes List most recent blood sugar readings, include date and time of day: 3-4 x per day,  FBS 189 Endocrine Self-Management Outcome: 4 (good) Endocrine Comment: Reviewed carbohydrate modified diet  Gastrointestinal Gastrointestinal Symptoms Reported: No symptoms reported      Genitourinary Genitourinary Symptoms Reported: No symptoms reported    Integumentary Integumentary Symptoms Reported: No symptoms reported    Musculoskeletal Musculoskelatal Symptoms Reviewed: No symptoms reported        Psychosocial Psychosocial Symptoms Reported: No symptoms reported         There were no vitals filed for this visit.  Medications Reviewed Today     Reviewed by Aura Mliss LABOR, RN (Registered Nurse) on 11/23/23 at 1043  Med List Status: <None>   Medication Order Taking? Sig Documenting Provider Last Dose Status Informant  acetaminophen  (TYLENOL ) 325 MG tablet [id number]  Take 2 tablets (650 mg total) by mouth every 6 (six) hours as needed for mild pain (pain score 1-3) (or Fever >/= 101). Pearlean Manus, MD  Active Self, Pharmacy Records  albuterol  (VENTOLIN  HFA) 108 [id number] Base) MCG/ACT inhaler [id number]  Inhale 2 puffs into the lungs every 6 (six) hours as needed for shortness of breath or wheezing. Pearlean Manus, MD  Active Self, Pharmacy Records  Ascorbic Acid  (VITAMIN C) 1000 MG tablet [id number]  Take 1,000 mg by mouth 3 (three) times daily.  [provider]  Active Self, Pharmacy Records  aspirin  EC 81 MG tablet [id number]  Take 1 tablet (81 mg total) by mouth daily with breakfast.  In the morning Pearlean Manus, MD  Active Self, Pharmacy Records  BD ULTRA-FINE PEN NEEDLES 29G X 12.7MM MISC [id number]  USE PENNEEDLES 3 TIMES  DAILY AS DIRECTED Lavell Bari LABOR, FNP  Active Self, Pharmacy Records  blood glucose meter kit and supplies [id number]  Dispense  based on patient and insurance preference. Use up to four times daily as directed. (FOR ICD-10 E10.9, E11.9). Lavell Bari LABOR, FNP  Active Self, Pharmacy Records  Calcium  Carbonate-Vit D-Min (CALTRATE 600+D PLUS MINERALS) 600-800 MG-UNIT TABS [id number]  Take 1 tablet by mouth 3 (three) times daily.  [provider]  Active Self, Pharmacy Records  carvedilol  (COREG ) 25 MG tablet [id number]  Take 1 tablet (25 mg total) by mouth 2 (two) times daily with a meal. Pearlean Manus, MD  Expired 11/12/23 2359 Self, Pharmacy Records           Med Note STEFFI, [place]   Sat Nov 07, 2023  8:25 AM) If the patients blood pressure is 120 systolic, patient takes a half a tablet.   Cholecalciferol  (VITAMIN D -3) 125 MCG (5000 UT) TABS [id number]  Take 5,000 Units by mouth daily. [provider]  Active Self, Pharmacy Records  clopidogrel  (PLAVIX ) 75 MG tablet [id number]  Take 1 tablet (75 mg total) by mouth daily. Pearlean Manus, MD  Active Self, Pharmacy Records  fish oil-omega-3 fatty acids  1000 MG capsule [id number]  Take 1 g by mouth daily.  [provider]  Active Self, Pharmacy Records  furosemide  (LASIX ) 40 MG tablet [id number]  Take 1 tablet (40 mg total) by mouth daily. Darron Deatrice LABOR, MD  Active Self, Pharmacy Records  glucose blood (GLUCOSE METER TEST) test strip [id number]  Use 6-10 times a day, Uses [employer], Bari LABOR, FNP  Active Self, Pharmacy Records  hydroxypropyl methylcellulose (ISOPTO TEARS) 2.5 % ophthalmic solution [id number]  Place 1 drop into both eyes 3 (three) times daily as needed (for dry eyes). [provider]  Active Self, Pharmacy Records  insulin  degludec (TRESIBA  FLEXTOUCH) 100 UNIT/ML FlexTouch Pen [id number]  Inject 20 Units into the skin at bedtime. Ricky Fines, MD  Active Self, Pharmacy Records  insulin  lispro (HUMALOG  [facility]) 200 UNIT/ML KwikPen [id number]  Inject 15-30 Units into the skin 3 (three) times daily before meals. Ricky Fines,  MD  Active Self, Pharmacy Records  Insulin  Syringe 27G X 1/2 0.5 ML MISC [id number]  Use with Fiasp  insulin  TID Lavell Bari LABOR, FNP  Active Self, Pharmacy Records  losartan  (COZAAR ) 100 MG tablet [id number]  Take 1 tablet (100 mg total) by mouth daily. Sigdel, Santosh, MD  Active   montelukast  (SINGULAIR ) 10 MG tablet [id number]  Take 1 tablet by mouth once daily with breakfast Lavell Bari A, FNP  Active   nitroGLYCERIN  (NITROSTAT ) 0.4 MG SL tablet [id number]  Place 1 tablet (0.4 mg total) under the tongue every 5 (five) minutes as needed for chest pain. Arlice Reichert, MD  Active Self, Pharmacy Records  oxybutynin  (DITROPAN -XL) 5 MG 24 hr tablet [id number]  Take 1 tablet (5 mg total) by mouth at bedtime. Lavell Bari LABOR, FNP  Active Self, Pharmacy Records           Med Note STEFFI, [place]   Sat Nov 07, 2023  8:26 AM)    polyethylene glycol (MIRALAX  / GLYCOLAX ) 17 g packet [id number]  Take 17 g by mouth daily as needed. Ricky Fines, MD  Active Self, Pharmacy Records  potassium chloride  SA (KLOR-CON  M) 20 MEQ tablet [id number]  Take 1 tablet (  20 mEq total) by mouth daily.  Patient not taking: Reported on 11/12/2023   Ricky Fines, MD  Active Self, Pharmacy Records  RELION PEN NEEDLES 29G X MISC [id number]  USE 1 NEEDLE TO INJECT INSULIN  SUBCUTANEOUSLY 3 TIMES DAILY Lavell Bari LABOR, FNP  Active Self, Pharmacy Records  rosuvastatin  (CRESTOR ) 40 MG tablet [id number]  Take 1 tablet (40 mg total) by mouth daily. Lavell Bari A, FNP  Active   Semaglutide , 2 MG/DOSE, (OZEMPIC , 2 MG/DOSE,) 8 MG/3ML SOPN [id number]  Inject 2 mg into the skin once a week. Lavell Bari LABOR, FNP  Active             Goals Addressed             This Visit's Progress    [facility] Transitions of Care (TOC) Care Plan       Problems:  Recent Hospitalization for treatment of acute hypoxic respiratory failure/ hypertensive crisis Pt lives alone, has neighbors that assist her if needed,  pt is weighing daily,  checking CBG and monitoring blood pressure daily Pt reports she is taking blood pressure medication as prescribed 11/23/23- pt states she is doing well, feeling stronger, continues checking blood pressure, weight, blood sugar, oxygen saturation and keeping a log  Goal:  Over the next 30 days, the patient will not experience hospital readmission  Interventions:  Evaluation of current treatment plan related to acute hypoxic respiratory failure, self-management and patient's adherence to plan as established by provider. Discussed plans with patient for ongoing care management follow up and provided patient with direct contact information for care management team Evaluation of current treatment plan related to acute hypoxic respiratory failure and patient's adherence to plan as established by provider Reviewed medications with patient and discussed importance of taking as prescribed Reviewed scheduled/upcoming provider appointments including cardiologist Dr. Darron 11/24/23 Discussed plans with patient for ongoing care management follow up and provided patient with direct contact information for care management team Verified pt is checking BP daily, keeping a log Reinforced normal parameters for BP Reviewed action plan for hypertension, HF  Patient Self Care Activities:  Attend all scheduled provider appointments Call pharmacy for medication refills 3-7 days in advance of running out of medications Call provider office for new concerns or questions  Notify RN Care Manager of TOC call rescheduling needs Participate in Transition of Care Program/Attend TOC scheduled calls Perform all self care activities independently  Perform IADL's (shopping, preparing meals, housekeeping, managing finances) independently Take medications as prescribed   Continue checking blood pressure, weight, blood sugar daily, keep a log and take to doctor's appointments  Plan:  Telephone follow up appointment with care  management team member scheduled for:  11/30/23 @ 1030 am The patient has been provided with contact information for the care management team and has been advised to call with any health related questions or concerns.         Recommendation:   PCP Follow-up Specialty provider follow-up cardiology Dr. Darron 11/24/23  Follow Up Plan:   Telephone follow-up 11/30/23 @ 1030 am  Mliss Creed [facility], BSN RN Care Manager/ Transition of Care [place]/ [facility] [phone]

## 2023-11-24 ENCOUNTER — Ambulatory Visit: Attending: Cardiovascular Disease | Admitting: Cardiovascular Disease

## 2023-11-24 VITALS — BP 148/70 | HR 67 | Ht 62.0 in | Wt 194.6 lb

## 2023-11-24 DIAGNOSIS — I150 Renovascular hypertension: Secondary | ICD-10-CM

## 2023-11-24 DIAGNOSIS — E785 Hyperlipidemia, unspecified: Secondary | ICD-10-CM | POA: Diagnosis not present

## 2023-11-24 DIAGNOSIS — I739 Peripheral vascular disease, unspecified: Secondary | ICD-10-CM

## 2023-11-24 DIAGNOSIS — I25708 Atherosclerosis of coronary artery bypass graft(s), unspecified, with other forms of angina pectoris: Secondary | ICD-10-CM | POA: Diagnosis not present

## 2023-11-24 DIAGNOSIS — I5032 Chronic diastolic (congestive) heart failure: Secondary | ICD-10-CM

## 2023-11-24 MED ORDER — ISOSORBIDE MONONITRATE ER 30 MG PO TB24: 30.0000 mg | ORAL_TABLET | Freq: Every day | ORAL | 3 refills | Status: AC

## 2023-11-24 NOTE — H&P (View-Only) (Signed)
 Cardiology Office Note   Date:  11/24/2023   ID:  [name], [name] [birth year], MRN [id number]  PCP:  Lavell Bari LABOR, FNP  Cardiologist: Dr. Darron   Chief Complaint  Patient presents with   4 month and ED follow up     History of Present Illness: Leah Ferrell is a 73 y.o. female who presents for a followup visit regarding peripheral arterial disease and coronary artery disease.  She has known history of coronary artery disease status post drug-eluting stent placement to the LAD X 2 with subsequent CABG in May 2022. She is known to have peripheral arterial disease status post left common iliac artery stent placement followed by left common femoral artery resection and bypass from left external iliac to the left profunda done by Dr. Gerlean in 2015. She is known to have chronically occluded bilateral SFAs being managed medically.  She had worsening left leg claudication in 2018 with elevated velocity at the proximal anastomosis of the graft.   Angiography in March 2018 showed mild to moderate calcified common iliac artery disease with patent stent in the left common iliac artery. There was severe stenosis in the left common femoral artery at the proximal anastomosis of the bypass to the profunda. I performed successful angioplasty and drug-coated balloon angioplasty.   She is status post bilateral renal artery stenting in January 2021 due to refractory hypertension and recurrent heart failure.  She had significant improvement in blood pressure control as well as heart failure symptoms since then.    Lower extremity angiography in March of 2022 showed patent renal artery stents with significant restenosis on the right side, severe bilateral heavily calcified ostial common iliac artery disease extending into the distal aorta.  The iliac arteries were treated by successful kissing stent placement.  On the right side, there was moderate common femoral artery stenosis as well as  chronically occluded SFA with reconstitution distally via collaterals from the profunda, short occlusion of above-the-knee popliteal artery with collaterals and three-vessel runoff below the knee.  On the left side, there was flush occlusion of the SFA with collaterals from the profunda and two-vessel runoff below the knee. She underwent CABG in May, 2022 with LIMA to LAD, SVG to OM and SVG to diagonal.  Most recent ABI was done in October 2024 and was moderately to severely reduced bilaterally at 0.49.  She was hospitalized in February of 2025 with non-STEMI in the setting of severely elevated blood pressure.  Echocardiogram showed an EF of 50 to 55%.  Cardiac catheterization was done via the left radial artery and showed heavily calcified coronary arteries with severe underlying three-vessel coronary artery disease with patent grafts including LIMA to LAD, SVG to diagonal and SVG to OM 3.  There was severe progression of native coronary artery disease including the right coronary artery which was not bypassed.  I performed difficult but successful PCI and 3 overlapped drug-eluting stent placement to the right coronary artery.  Renal angiography showed widely patent left renal artery stent with no significant restenosis.  Right renal artery stent was patent but with severe in-stent restenosis proximally.  She was hospitalized at [facility] in April with acute on chronic diastolic heart failure.  She was diuresed and improved.  Subsequently, she developed hypotension.  We did labs which showed evidence of volume depletion with hyperkalemia.  Lisinopril  and spironolactone  were stopped.  She attended vascular rehab with significant improvement in claudication symptoms.   She was hospitalized  in September with acute on chronic diastolic heart failure in setting of elevated blood pressure.  Her troponin was mildly elevated.  Echocardiogram showed normal LV systolic function with mild mitral  stenosis.  She reports substernal chest tightness with exertion that is overall mild and goes away with continued exercise.   Past Medical History:  Diagnosis Date   CAD (coronary artery disease)    a. 04/2012 NSTEMI: s/p DES to LAD.   Chronic diastolic CHF (congestive heart failure) (HCC)    Diabetes mellitus without complication (HCC)    Dysrhythmia    Environmental and seasonal allergies    Hyperlipidemia    Hypertension    Leukocytosis    Morbid obesity (HCC)    NSTEMI (non-ST elevated myocardial infarction) (HCC) 04/27/2012   PONV (postoperative nausea and vomiting)    PVD (peripheral vascular disease)    a. 04/2012 ABI: R 0.49, L 0.39. Angiography 06/14: Significant ostial left common iliac artery stenosis extending into the distal aorta, severe left common femoral artery stenosis, bilateral SFA occlusion with heavy calcifications. Functionally, one-vessel runoff bilaterally below the knee   Renal artery stenosis    s/p angioplasty/stenting bilateral renal arteries 03/16/19   Shortness of breath     Past Surgical History:  Procedure Laterality Date   ABDOMINAL AORTAGRAM N/A 07/21/2012   Procedure: ABDOMINAL EZELLA;  Surgeon: Deatrice DELENA Cage, MD;  Location: [facility] CATH LAB;  Service: Cardiovascular;  Laterality: N/A;   ABDOMINAL AORTOGRAM W/LOWER EXTREMITY N/A 05/09/2020   Procedure: ABDOMINAL AORTOGRAM W/LOWER EXTREMITY;  Surgeon: Cage Deatrice DELENA, MD;  Location: [facility] INVASIVE CV LAB;  Service: Cardiovascular;  Laterality: N/A;   CARDIAC CATHETERIZATION     stents X 2   CATARACT EXTRACTION W/PHACO Left 09/08/2022   Procedure: CATARACT EXTRACTION PHACO AND INTRAOCULAR LENS PLACEMENT (IOC) with placement of Corticosteroid;  Surgeon: Harrie Agent, MD;  Location: [facility] ORS;  Service: Ophthalmology;  Laterality: Left;  CDE 8.48   CORONARY ANGIOGRAM  04/27/2012   Procedure: CORONARY ANGIOGRAM;  Surgeon: Aleene JINNY Passe, MD;  Location: [facility] CATH LAB;  Service: Cardiovascular;;   CORONARY  ARTERY BYPASS GRAFT N/A 06/25/2020   Procedure: CORONARY ARTERY BYPASS GRAFTING (CABG)X3. LEFT INTERNAL MAMMARY ARTERY. RIGHT ENDOSCOPIC SAPHENOUS VEIN HARVESTING.;  Surgeon: German Bartlett PEDLAR, MD;  Location: [facility] OR;  Service: Open Heart Surgery;  Laterality: N/A;   CORONARY STENT INTERVENTION N/A 04/08/2023   Procedure: CORONARY STENT INTERVENTION;  Surgeon: Cage Deatrice DELENA, MD;  Location: [facility] INVASIVE CV LAB;  Service: Cardiovascular;  Laterality: N/A;  RCA   ENDARTERECTOMY FEMORAL Left 11/02/2013   Procedure: RESECTION LEFT COMMON FEMORAL ARTERY AND INSERTION OF INTERPOSITION 7mm HEMASHIELD GRAFT FROM LEFT EXTERNAL  ILIAC ARTERY TO LEFT PROFUNDA  FEMORIS ARTERY;  Surgeon: Agent JONETTA Collum, MD;  Location: [facility] OR;  Service: Vascular;  Laterality: Left;   EYE SURGERY Right 2017   FRACTURE SURGERY Right Jul 07, 2014   Right Foot-  [place]  [place]  by Heat duct   ILIAC ARTERY STENT Left 08/31/2013   LAD stent     LEFT HEART CATH AND CORONARY ANGIOGRAPHY N/A 05/09/2020   Procedure: LEFT HEART CATH AND CORONARY ANGIOGRAPHY;  Surgeon: Cage Deatrice DELENA, MD;  Location: [facility] CV [facility];  Service: Cardiovascular;  Laterality: N/A;   LEFT HEART CATH AND CORS/GRAFTS ANGIOGRAPHY N/A 04/08/2023   Procedure: LEFT HEART CATH AND CORS/GRAFTS ANGIOGRAPHY;  Surgeon: Cage Deatrice DELENA, MD;  Location: [facility];  Service: Cardiovascular;  Laterality: N/A;   LEFT HEART CATHETERIZATION  WITH CORONARY ANGIOGRAM N/A 04/23/2012   Procedure: LEFT HEART CATHETERIZATION WITH CORONARY ANGIOGRAM;  Surgeon: Peter M Swaziland, MD;  Location: [facility] CATH LAB;  Service: Cardiovascular;  Laterality: N/A;   LOWER EXTREMITY ANGIOGRAM Left 08/31/2013   Procedure: LOWER EXTREMITY ANGIOGRAM;  Surgeon: Deatrice DELENA Cage, MD;  Location: [facility];  Service: Cardiovascular;  Laterality: Left;   PERCUTANEOUS STENT INTERVENTION Left 08/31/2013   Procedure: PERCUTANEOUS STENT INTERVENTION;  Surgeon: Deatrice DELENA Cage, MD;  Location: [facility] CATH [facility];   Service: Cardiovascular;  Laterality: Left;  Common Iliac artery   PERIPHERAL VASCULAR CATHETERIZATION N/A 03/19/2016   Procedure: Abdominal Aortogram w/Lower Extremity;  Surgeon: Deatrice DELENA Cage, MD;  Location: [facility];  Service: Cardiovascular;  Laterality: N/A;   PERIPHERAL VASCULAR CATHETERIZATION  03/19/2016   Procedure: Peripheral Vascular Balloon Angioplasty-CFA Left;  Surgeon: Deatrice DELENA Cage, MD;  Location: [facility];  Service: Cardiovascular;;   PERIPHERAL VASCULAR INTERVENTION Bilateral 03/16/2019   Procedure: PERIPHERAL VASCULAR INTERVENTION;  Surgeon: Cage Deatrice DELENA, MD;  Location: [facility];  Service: Cardiovascular;  Laterality: Bilateral;  RENAL   PERIPHERAL VASCULAR INTERVENTION Bilateral 05/09/2020   Procedure: PERIPHERAL VASCULAR INTERVENTION;  Surgeon: Cage Deatrice DELENA, MD;  Location: [facility];  Service: Cardiovascular;  Laterality: Bilateral;  Iliac   RENAL ANGIOGRAPHY  03/16/2019   RENAL ANGIOGRAPHY Bilateral 03/16/2019   Procedure: RENAL ANGIOGRAPHY;  Surgeon: Cage Deatrice DELENA, MD;  Location: [facility];  Service: Cardiovascular;  Laterality: Bilateral;   RENAL ANGIOGRAPHY Bilateral 04/08/2023   Procedure: RENAL ANGIOGRAPHY;  Surgeon: Cage Deatrice DELENA, MD;  Location: [facility];  Service: Cardiovascular;  Laterality: Bilateral;   TEE WITHOUT CARDIOVERSION N/A 06/25/2020   Procedure: TRANSESOPHAGEAL ECHOCARDIOGRAM (TEE);  Surgeon: German Bartlett PEDLAR, MD;  Location: [facility] OR;  Service: Open Heart Surgery;  Laterality: N/A;   TUMOR REMOVAL     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     albuterol  (VENTOLIN  HFA) 108 (90 Base) MCG/ACT inhaler Inhale 2 puffs into the lungs every 6 (six) hours as needed for shortness  of breath or wheezing. 8 g 2   Ascorbic Acid  (VITAMIN C) 1000 MG tablet Take 1,000 mg by mouth 3 (three) times daily.      aspirin  EC 81 MG tablet Take 1 tablet (81 mg total) by mouth daily with breakfast. In the morning 30 tablet 11   Calcium  Carbonate-Vit D-Min (CALTRATE 600+D PLUS MINERALS) 600-800 MG-UNIT TABS Take 1 tablet by mouth 3 (three) times daily.      carvedilol  (COREG ) 25 MG tablet Take 1 tablet (25 mg total) by mouth 2 (two) times daily with a meal. 180 tablet 2   Cholecalciferol  (VITAMIN D -3) 125 MCG (5000 UT) TABS Take 5,000 Units by mouth daily.     clopidogrel  (PLAVIX ) 75 MG tablet Take 1 tablet (75 mg total) by mouth daily. 90 tablet 3   fish oil-omega-3 fatty acids  1000 MG capsule Take 1 g by mouth daily.      furosemide  (LASIX ) 40 MG tablet Take 1 tablet (40 mg total) by mouth daily. 30 tablet 3   glucose blood (GLUCOSE METER TEST) test strip Use 6-10 times a day, Uses ReliOn 900 each 12   hydroxypropyl methylcellulose (ISOPTO  TEARS) 2.5 % ophthalmic solution Place 1 drop into both eyes 3 (three) times daily as needed (for dry eyes).     insulin  degludec (TRESIBA  FLEXTOUCH) 100 UNIT/ML FlexTouch Pen Inject 20 Units into the skin at bedtime.     insulin  lispro (HUMALOG  KWIKPEN) 200 UNIT/ML KwikPen Inject 15-30 Units into the skin 3 (three) times daily before meals.     Insulin  Syringe 27G X 1/2 0.5 ML MISC Use with Fiasp  insulin  TID 100 each 3   isosorbide  mononitrate (IMDUR ) 30 MG 24 hr tablet Take 1 tablet (30 mg total) by mouth daily. 90 tablet 3   losartan  (COZAAR ) 100 MG tablet Take 1 tablet (100 mg total) by mouth daily. 30 tablet 0   montelukast  (SINGULAIR ) 10 MG tablet Take 1 tablet by mouth once daily with breakfast 90 tablet 1   oxybutynin  (DITROPAN -XL) 5 MG 24 hr tablet Take 1 tablet (5 mg total) by mouth at bedtime. 90 tablet 1   polyethylene glycol (MIRALAX  / GLYCOLAX ) 17 g packet Take 17 g by mouth daily as needed.     rosuvastatin  (CRESTOR ) 40 MG tablet Take 1  tablet (40 mg total) by mouth daily. 90 tablet 0   Semaglutide , 2 MG/DOSE, (OZEMPIC , 2 MG/DOSE,) 8 MG/3ML SOPN Inject 2 mg into the skin once a week. 3 mL 0   BD ULTRA-FINE PEN NEEDLES 29G X 12.7MM MISC USE PENNEEDLES 3 TIMES  DAILY AS DIRECTED 270 each 1   blood glucose meter kit and supplies Dispense based on patient and insurance preference. Use up to four times daily as directed. (FOR ICD-10 E10.9, E11.9). 1 each 0   nitroGLYCERIN  (NITROSTAT ) 0.4 MG SL tablet Place 1 tablet (0.4 mg total) under the tongue every 5 (five) minutes as needed for chest pain. 10 tablet 3   potassium chloride  SA (KLOR-CON  M) 20 MEQ tablet Take 1 tablet (20 mEq total) by mouth daily. (Patient not taking: Reported on 11/24/2023) 30 tablet 1   RELION PEN NEEDLES 29G X MISC USE 1 NEEDLE TO INJECT INSULIN  SUBCUTANEOUSLY 3 TIMES DAILY 50 each 5   No current facility-administered medications for this visit.    Allergies:   Tape    Social History:  The patient  reports that she quit smoking about 22 years ago. Her smoking use included cigarettes. She started smoking about 59 years ago. She has a 36.8 pack-year smoking history. She has never used smokeless tobacco. She reports that she does not drink alcohol  and does not use drugs.   Family History:  The patient's family history includes Alcohol  abuse in her brother; Diabetes in her brother, daughter, maternal grandmother, mother, and son; Heart attack in her maternal grandfather; Heart disease in her brother; Hyperlipidemia in her mother and son; Hypertension in her daughter, mother, and son; Other in her sister and sister; Peripheral vascular disease in her mother.    ROS:  Please see the history of present illness.   Otherwise, review of systems are positive for none.   All other systems are reviewed and negative.    PHYSICAL EXAM: VS:  BP (!) 148/70 (BP Location: Left Arm, Patient Position: Sitting)   Pulse 67   Ht 5' 2 (1.575 m)   Wt 194 lb 9.6 oz (88.3 kg)    SpO2 97%   BMI 35.59 kg/m  , BMI Body mass index is 35.59 kg/m. GEN: Well nourished, well developed, in no acute distress  HEENT: normal  Neck: no JVD or masses.  Right carotid bruit.  Cardiac: RRR; no  rubs, or gallops , trace edema . There is 2/6 systolic ejection murmur in the aortic area.  Respiratory:  clear to auscultation bilaterally, normal work of breathing GI: soft, nontender, nondistended, + BS MS: no deformity or atrophy  Skin: warm and dry, no rash Neuro:  Strength and sensation are intact Psych: euthymic mood, full affect Distal pulses are not palpable.  Radial pulses are normal bilaterally.   EKG:  EKG is not ordered today.    Recent Labs: 08/11/2023: TSH 1.310 11/06/2023: B Natriuretic Peptide 298.0 11/08/2023: Magnesium  1.7 11/12/2023: ALT 33; BUN 16; Creatinine, Ser 1.03; Hemoglobin 13.6; Platelets 256; Potassium 4.7; Sodium 136    Lipid Panel    Component Value Date/Time   CHOL 141 08/11/2023 1207   TRIG 101 08/11/2023 1207   TRIG 135 09/16/2012 1322   HDL 62 08/11/2023 1207   HDL 60 09/16/2012 1322   CHOLHDL 2.3 08/11/2023 1207   CHOLHDL 2.5 04/08/2023 0226   VLDL 27 04/08/2023 0226   LDLCALC 61 08/11/2023 1207   LDLCALC 92 09/16/2012 1322      Wt Readings from Last 3 Encounters:  11/24/23 194 lb 9.6 oz (88.3 kg)  11/23/23 189 lb (85.7 kg)  11/16/23 188 lb (85.3 kg)           No data to display            ASSESSMENT AND PLAN:  1. Peripheral arterial disease: Status post  kissing stent placement to the common iliac arteries .  She has moderately reduced ABI at 0.49 bilaterally .  Severe claudication improved significantly with a supervised exercise therapy program.  She is now able to walk 30 minutes without having to stop.  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  Angiography in February confirmed patent left renal artery stent with no significant restenosis but there was significant right renal  artery in-stent restenosis.  Given recurrent episodes of flash pulmonary edema and elevated blood pressure, I suspect that her renal artery stenosis is the culprit.  As such, I recommend proceeding with renal artery angiography and possible endovascular intervention to address the renal artery in-stent restenosis on the right side.  Will plan on radial access given her extensive peripheral arterial disease.  3. Coronary artery disease involving native coronary arteries with stable angina: Status post successful PCI and drug-eluting stent placement to the native right coronary artery which was not bypassed before.  Continue long-term dual antiplatelet therapy.  She does report mild angina and thus I added Imdur  30 mg daily.  4. Hyperlipidemia: Continue high-dose rosuvastatin .  Recent lipid profile showed improvement in LDL which was down to 49.  Her LP(a) was severely elevated at 241 and we might have to address that at some point in the future.  6.  Chronic diastolic heart failure: She appears to be euvolemic on furosemide  40 mg once daily.  Blood pressure is reasonably controlled on Toprol  and losartan .    Disposition:   Proceed with renal artery angiography and possible endovascular intervention.  Signed,  Deatrice Cage, MD  11/24/2023 12:52 PM    [facility]

## 2023-11-24 NOTE — Progress Notes (Signed)
 Cardiology Office Note   Date:  11/24/2023   ID:  Leah, Ferrell [birth year], MRN [id number]  PCP:  Lavell Bari LABOR, FNP  Cardiologist: Dr. Darron   Chief Complaint  Patient presents with   4 month and ED follow up     History of Present Illness: Leah Ferrell is a 73 y.o. female who presents for a followup visit regarding peripheral arterial disease and coronary artery disease.  She has known history of coronary artery disease status post drug-eluting stent placement to the LAD X 2 with subsequent CABG in May 2022. She is known to have peripheral arterial disease status post left common iliac artery stent placement followed by left common femoral artery resection and bypass from left external iliac to the left profunda done by Dr. Gerlean in 2015. She is known to have chronically occluded bilateral SFAs being managed medically.  She had worsening left leg claudication in 2018 with elevated velocity at the proximal anastomosis of the graft.   Angiography in March 2018 showed mild to moderate calcified common iliac artery disease with patent stent in the left common iliac artery. There was severe stenosis in the left common femoral artery at the proximal anastomosis of the bypass to the profunda. I performed successful angioplasty and drug-coated balloon angioplasty.   She is status post bilateral renal artery stenting in January 2021 due to refractory hypertension and recurrent heart failure.  She had significant improvement in blood pressure control as well as heart failure symptoms since then.    Lower extremity angiography in March of 2022 showed patent renal artery stents with significant restenosis on the right side, severe bilateral heavily calcified ostial common iliac artery disease extending into the distal aorta.  The iliac arteries were treated by successful kissing stent placement.  On the right side, there was moderate common femoral artery stenosis as well as  chronically occluded SFA with reconstitution distally via collaterals from the profunda, short occlusion of above-the-knee popliteal artery with collaterals and three-vessel runoff below the knee.  On the left side, there was flush occlusion of the SFA with collaterals from the profunda and two-vessel runoff below the knee. She underwent CABG in May, 2022 with LIMA to LAD, SVG to OM and SVG to diagonal.  Most recent ABI was done in October 2024 and was moderately to severely reduced bilaterally at 0.49.  She was hospitalized in February of 2025 with non-STEMI in the setting of severely elevated blood pressure.  Echocardiogram showed an EF of 50 to 55%.  Cardiac catheterization was done via the left radial artery and showed heavily calcified coronary arteries with severe underlying three-vessel coronary artery disease with patent grafts including LIMA to LAD, SVG to diagonal and SVG to OM 3.  There was severe progression of native coronary artery disease including the right coronary artery which was not bypassed.  I performed difficult but successful PCI and 3 overlapped drug-eluting stent placement to the right coronary artery.  Renal angiography showed widely patent left renal artery stent with no significant restenosis.  Right renal artery stent was patent but with severe in-stent restenosis proximally.  She was hospitalized at [facility] in April with acute on chronic diastolic heart failure.  She was diuresed and improved.  Subsequently, she developed hypotension.  We did labs which showed evidence of volume depletion with hyperkalemia.  Lisinopril  and spironolactone  were stopped.  She attended vascular rehab with significant improvement in claudication symptoms.   She was hospitalized  in September with acute on chronic diastolic heart failure in setting of elevated blood pressure.  Her troponin was mildly elevated.  Echocardiogram showed normal LV systolic function with mild mitral  stenosis.  She reports substernal chest tightness with exertion that is overall mild and goes away with continued exercise.   Past Medical History:  Diagnosis Date   CAD (coronary artery disease)    a. 04/2012 NSTEMI: s/p DES to LAD.   Chronic diastolic CHF (congestive heart failure) (HCC)    Diabetes mellitus without complication (HCC)    Dysrhythmia    Environmental and seasonal allergies    Hyperlipidemia    Hypertension    Leukocytosis    Morbid obesity (HCC)    NSTEMI (non-ST elevated myocardial infarction) (HCC) 04/27/2012   PONV (postoperative nausea and vomiting)    PVD (peripheral vascular disease)    a. 04/2012 ABI: R 0.49, L 0.39. Angiography 06/14: Significant ostial left common iliac artery stenosis extending into the distal aorta, severe left common femoral artery stenosis, bilateral SFA occlusion with heavy calcifications. Functionally, one-vessel runoff bilaterally below the knee   Renal artery stenosis    s/p angioplasty/stenting bilateral renal arteries 03/16/19   Shortness of breath     Past Surgical History:  Procedure Laterality Date   ABDOMINAL AORTAGRAM N/A 07/21/2012   Procedure: ABDOMINAL EZELLA;  Surgeon: Deatrice DELENA Cage, MD;  [facility]: [facility] CATH [facility];  Service: Cardiovascular;  Laterality: N/A;   ABDOMINAL AORTOGRAM W/LOWER EXTREMITY N/A 05/09/2020   Procedure: ABDOMINAL AORTOGRAM W/LOWER EXTREMITY;  Surgeon: Cage Deatrice DELENA, MD;  Location: [facility] INVASIVE CV LAB;  Service: Cardiovascular;  Laterality: N/A;   CARDIAC CATHETERIZATION     stents X 2   CATARACT EXTRACTION W/PHACO Left 09/08/2022   Procedure: CATARACT EXTRACTION PHACO AND INTRAOCULAR LENS PLACEMENT (IOC) with placement of Corticosteroid;  Surgeon: Harrie Agent, MD;  Location: [facility] ORS;  Service: Ophthalmology;  Laterality: Left;  CDE 8.48   CORONARY ANGIOGRAM  04/27/2012   Procedure: CORONARY ANGIOGRAM;  Surgeon: Aleene JINNY Passe, MD;  Location: [facility] CATH LAB;  Service: Cardiovascular;;   CORONARY  ARTERY BYPASS GRAFT N/A 06/25/2020   Procedure: CORONARY ARTERY BYPASS GRAFTING (CABG)X3. LEFT INTERNAL MAMMARY ARTERY. RIGHT ENDOSCOPIC SAPHENOUS VEIN HARVESTING.;  Surgeon: German Bartlett PEDLAR, MD;  Location: [facility] OR;  Service: Open Heart Surgery;  Laterality: N/A;   CORONARY STENT INTERVENTION N/A 04/08/2023   Procedure: CORONARY STENT INTERVENTION;  Surgeon: Cage Deatrice DELENA, MD;  [facility] CV LAB;  Service: Cardiovascular;  Laterality: N/A;  RCA   ENDARTERECTOMY FEMORAL Left 11/02/2013   Procedure: RESECTION LEFT COMMON FEMORAL ARTERY AND INSERTION OF INTERPOSITION 7mm HEMASHIELD GRAFT FROM LEFT EXTERNAL  ILIAC ARTERY TO LEFT PROFUNDA  FEMORIS ARTERY;  Surgeon: Agent JONETTA Collum, MD;  Location: [facility] OR;  Service: Vascular;  Laterality: Left;   EYE SURGERY Right 2017   FRACTURE SURGERY Right Jul 07, 2014   Right [place]-  [place]  [facility]  by Heat duct   ILIAC ARTERY STENT Left 08/31/2013   LAD stent     LEFT HEART CATH AND CORONARY ANGIOGRAPHY N/A 05/09/2020   Procedure: LEFT HEART CATH AND CORONARY ANGIOGRAPHY;  Surgeon: Cage Deatrice DELENA, MD;  [facility];  Service: Cardiovascular;  Laterality: N/A;   LEFT HEART CATH AND CORS/GRAFTS ANGIOGRAPHY N/A 04/08/2023   Procedure: LEFT HEART CATH AND CORS/GRAFTS ANGIOGRAPHY;  Surgeon: Cage Deatrice DELENA, MD;  [facility];  Service: Cardiovascular;  Laterality: N/A;   LEFT HEART CATHETERIZATION  WITH CORONARY ANGIOGRAM N/A 04/23/2012   Procedure: LEFT HEART CATHETERIZATION WITH CORONARY ANGIOGRAM;  Surgeon: Peter M Swaziland, MD;  Location: [facility] CATH LAB;  Service: Cardiovascular;  Laterality: N/A;   LOWER EXTREMITY ANGIOGRAM Left 08/31/2013   Procedure: LOWER EXTREMITY ANGIOGRAM;  Surgeon: Deatrice DELENA Cage, MD;  [facility];  Service: Cardiovascular;  Laterality: Left;   PERCUTANEOUS STENT INTERVENTION Left 08/31/2013   Procedure: PERCUTANEOUS STENT INTERVENTION;  Surgeon: Deatrice DELENA Cage, MD;  [facility];   Service: Cardiovascular;  Laterality: Left;  Common Iliac artery   PERIPHERAL VASCULAR CATHETERIZATION N/A 03/19/2016   Procedure: Abdominal Aortogram w/Lower Extremity;  Surgeon: Deatrice DELENA Cage, MD;  [facility];  Service: Cardiovascular;  Laterality: N/A;   PERIPHERAL VASCULAR CATHETERIZATION  03/19/2016   Procedure: Peripheral Vascular Balloon Angioplasty-CFA Left;  Surgeon: Deatrice DELENA Cage, MD;  [facility];  Service: Cardiovascular;;   PERIPHERAL VASCULAR INTERVENTION Bilateral 03/16/2019   Procedure: PERIPHERAL VASCULAR INTERVENTION;  Surgeon: Cage Deatrice DELENA, MD;  [facility];  Service: Cardiovascular;  Laterality: Bilateral;  RENAL   PERIPHERAL VASCULAR INTERVENTION Bilateral 05/09/2020   Procedure: PERIPHERAL VASCULAR INTERVENTION;  Surgeon: Cage Deatrice DELENA, MD;  [facility];  Service: Cardiovascular;  Laterality: Bilateral;  Iliac   RENAL ANGIOGRAPHY  03/16/2019   RENAL ANGIOGRAPHY Bilateral 03/16/2019   Procedure: RENAL ANGIOGRAPHY;  Surgeon: Cage Deatrice DELENA, MD;  [facility];  Service: Cardiovascular;  Laterality: Bilateral;   RENAL ANGIOGRAPHY Bilateral 04/08/2023   Procedure: RENAL ANGIOGRAPHY;  Surgeon: Cage Deatrice DELENA, MD;  [facility];  Service: Cardiovascular;  Laterality: Bilateral;   TEE WITHOUT CARDIOVERSION N/A 06/25/2020   Procedure: TRANSESOPHAGEAL ECHOCARDIOGRAM (TEE);  Surgeon: German Bartlett PEDLAR, MD;  Location: [facility] OR;  Service: Open Heart Surgery;  Laterality: N/A;   TUMOR REMOVAL     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     albuterol  (VENTOLIN  HFA) 108 (90 Base) MCG/ACT inhaler Inhale 2 puffs into the lungs every 6 (six) hours as needed for shortness  of breath or wheezing. 8 g 2   Ascorbic Acid  (VITAMIN C) 1000 MG tablet Take 1,000 mg by mouth 3 (three) times daily.      aspirin  EC 81 MG tablet Take 1 tablet (81 mg total) by mouth daily with breakfast. In the morning 30 tablet 11   Calcium  Carbonate-Vit D-Min (CALTRATE 600+D PLUS MINERALS) 600-800 MG-UNIT TABS Take 1 tablet by mouth 3 (three) times daily.      carvedilol  (COREG ) 25 MG tablet Take 1 tablet (25 mg total) by mouth 2 (two) times daily with a meal. 180 tablet 2   Cholecalciferol  (VITAMIN D -3) 125 MCG (5000 UT) TABS Take 5,000 Units by mouth daily.     clopidogrel  (PLAVIX ) 75 MG tablet Take 1 tablet (75 mg total) by mouth daily. 90 tablet 3   fish oil-omega-3 fatty acids  1000 MG capsule Take 1 g by mouth daily.      furosemide  (LASIX ) 40 MG tablet Take 1 tablet (40 mg total) by mouth daily. 30 tablet 3   glucose blood (GLUCOSE METER TEST) test strip Use 6-10 times a day, Uses ReliOn 900 each 12   hydroxypropyl methylcellulose (ISOPTO  TEARS) 2.5 % ophthalmic solution Place 1 drop into both eyes 3 (three) times daily as needed (for dry eyes).     insulin  degludec (TRESIBA  FLEXTOUCH) 100 UNIT/ML FlexTouch Pen Inject 20 Units into the skin at bedtime.     insulin  lispro (HUMALOG  KWIKPEN) 200 UNIT/ML KwikPen Inject 15-30 Units into the skin 3 (three) times daily before meals.     Insulin  Syringe 27G X 1/2 0.5 ML MISC Use with Fiasp  insulin  TID 100 each 3   isosorbide  mononitrate (IMDUR ) 30 MG 24 hr tablet Take 1 tablet (30 mg total) by mouth daily. 90 tablet 3   losartan  (COZAAR ) 100 MG tablet Take 1 tablet (100 mg total) by mouth daily. 30 tablet 0   montelukast  (SINGULAIR ) 10 MG tablet Take 1 tablet by mouth once daily with breakfast 90 tablet 1   oxybutynin  (DITROPAN -XL) 5 MG 24 hr tablet Take 1 tablet (5 mg total) by mouth at bedtime. 90 tablet 1   polyethylene glycol (MIRALAX  / GLYCOLAX ) 17 g packet Take 17 g by mouth daily as needed.     rosuvastatin  (CRESTOR ) 40 MG tablet Take 1  tablet (40 mg total) by mouth daily. 90 tablet 0   Semaglutide , 2 MG/DOSE, (OZEMPIC , 2 MG/DOSE,) 8 MG/3ML SOPN Inject 2 mg into the skin once a week. 3 mL 0   BD ULTRA-FINE PEN NEEDLES 29G X 12.7MM MISC USE PENNEEDLES 3 TIMES  DAILY AS DIRECTED 270 each 1   blood glucose meter kit and supplies Dispense based on patient and insurance preference. Use up to four times daily as directed. (FOR ICD-10 E10.9, E11.9). 1 each 0   nitroGLYCERIN  (NITROSTAT ) 0.4 MG SL tablet Place 1 tablet (0.4 mg total) under the tongue every 5 (five) minutes as needed for chest pain. 10 tablet 3   potassium chloride  SA (KLOR-CON  M) 20 MEQ tablet Take 1 tablet (20 mEq total) by mouth daily. (Patient not taking: Reported on 11/24/2023) 30 tablet 1   RELION PEN NEEDLES 29G X MISC USE 1 NEEDLE TO INJECT INSULIN  SUBCUTANEOUSLY 3 TIMES DAILY 50 each 5   No current facility-administered medications for this visit.    Allergies:   Tape    Social History:  The patient  reports that she quit smoking about 22 years ago. Her smoking use included cigarettes. She started smoking about 59 years ago. She has a 36.8 pack-year smoking history. She has never used smokeless tobacco. She reports that she does not drink alcohol  and does not use drugs.   Family History:  The patient's family history includes Alcohol  abuse in her brother; Diabetes in her brother, daughter, maternal grandmother, mother, and son; Heart attack in her maternal grandfather; Heart disease in her brother; Hyperlipidemia in her mother and son; Hypertension in her daughter, mother, and son; Other in her sister and sister; Peripheral vascular disease in her mother.    ROS:  Please see the history of present illness.   Otherwise, review of systems are positive for none.   All other systems are reviewed and negative.    PHYSICAL EXAM: VS:  BP (!) 148/70 (BP Location: Left Arm, Patient Position: Sitting)   Pulse 67   Ht 5' 2 (1.575 m)   Wt 194 lb 9.6 oz (88.3 kg)    SpO2 97%   BMI 35.59 kg/m  , BMI Body mass index is 35.59 kg/m. GEN: Well nourished, well developed, in no acute distress  HEENT: normal  Neck: no JVD or masses.  Right carotid bruit.  Cardiac: RRR; no  rubs, or gallops , trace edema . There is 2/6 systolic ejection murmur in the aortic area.  Respiratory:  clear to auscultation bilaterally, normal work of breathing GI: soft, nontender, nondistended, + BS MS: no deformity or atrophy  Skin: warm and dry, no rash Neuro:  Strength and sensation are intact Psych: euthymic mood, full affect Distal pulses are not palpable.  Radial pulses are normal bilaterally.   EKG:  EKG is not ordered today.    Recent Labs: 08/11/2023: TSH 1.310 11/06/2023: B Natriuretic Peptide 298.0 11/08/2023: Magnesium  1.7 11/12/2023: ALT 33; BUN 16; Creatinine, Ser 1.03; Hemoglobin 13.6; Platelets 256; Potassium 4.7; Sodium 136    Lipid Panel    Component Value Date/Time   CHOL 141 08/11/2023 1207   TRIG 101 08/11/2023 1207   TRIG 135 09/16/2012 1322   HDL 62 08/11/2023 1207   HDL 60 09/16/2012 1322   CHOLHDL 2.3 08/11/2023 1207   CHOLHDL 2.5 04/08/2023 0226   VLDL 27 04/08/2023 0226   LDLCALC 61 08/11/2023 1207   LDLCALC 92 09/16/2012 1322      Wt Readings from Last 3 Encounters:  11/24/23 194 lb 9.6 oz (88.3 kg)  11/23/23 189 lb (85.7 kg)  11/16/23 188 lb (85.3 kg)           No data to display            ASSESSMENT AND PLAN:  1. Peripheral arterial disease: Status post  kissing stent placement to the common iliac arteries .  She has moderately reduced ABI at 0.49 bilaterally .  Severe claudication improved significantly with a supervised exercise therapy program.  She is now able to walk 30 minutes without having to stop.  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  Angiography in February confirmed patent left renal artery stent with no significant restenosis but there was significant right renal  artery in-stent restenosis.  Given recurrent episodes of flash pulmonary edema and elevated blood pressure, I suspect that her renal artery stenosis is the culprit.  As such, I recommend proceeding with renal artery angiography and possible endovascular intervention to address the renal artery in-stent restenosis on the right side.  Will plan on radial access given her extensive peripheral arterial disease.  3. Coronary artery disease involving native coronary arteries with stable angina: Status post successful PCI and drug-eluting stent placement to the native right coronary artery which was not bypassed before.  Continue long-term dual antiplatelet therapy.  She does report mild angina and thus I added Imdur  30 mg daily.  4. Hyperlipidemia: Continue high-dose rosuvastatin .  Recent lipid profile showed improvement in LDL which was down to 49.  Her LP(a) was severely elevated at 241 and we might have to address that at some point in the future.  6.  Chronic diastolic heart failure: She appears to be euvolemic on furosemide  40 mg once daily.  Blood pressure is reasonably controlled on Toprol  and losartan .    Disposition:   Proceed with renal artery angiography and possible endovascular intervention.  Signed,  Deatrice Cage, MD  11/24/2023 12:52 PM    [facility]

## 2023-11-24 NOTE — Patient Instructions (Signed)
 Medication Instructions:  START Imdur  30 mg once daily  *If you need a refill on your cardiac medications before your next appointment, please call your pharmacy*  Lab Work: Your provider would like for you to return on 12/11/23 to have the following labs drawn: BMET and CBC. You do not need an appointment for the lab. The lab is located on the first floor at [address] [facility]. The lab is open from 8:00 am to 4:30 pm  You may also go to any of these [facility] locations:   [facility] - [address] ([facility]) - [address] [phone] [address]   [employer]  - [address]    [facility] - [address] - [id number] [employer] [address]   If you have labs (blood work) drawn today and your tests are completely normal, you will receive your results only by: [employer] (if you have MyChart) OR A paper copy in the mail If you have any lab test that is abnormal or we need to change your treatment, we will call you to review the results.  TEST: Your physician has requested that you have a renal artery duplex in 6 weeks. During this test, an ultrasound is used to evaluate blood flow to the kidneys. Take your medications as you usually do.  No food after 11PM the night before.  Water  is OK. (Don't drink liquids if you have been instructed not to for ANOTHER test). Avoid foods that produce bowel gas, for 24 hours prior to exam (see below). No breakfast, no chewing gum, no smoking or carbonated beverages. Patient may take morning medications with water . Come in for test at least 15 minutes early to register.  Please note: We ask at that you not bring children with you during ultrasound (echo/ vascular) testing. Due to room size and safety concerns, children are not allowed in the ultrasound rooms during exams. Our front office staff cannot provide observation of children in our lobby area while testing is being  conducted. An adult accompanying a patient to their appointment will only be allowed in the ultrasound room at the discretion of the ultrasound technician under special circumstances. We apologize for any inconvenience.  Follow-Up: At [facility], you and your health needs are our priority.  As part of our continuing mission to provide you with exceptional heart care, our providers are all part of one team.  This team includes your primary Cardiologist (physician) and Advanced Practice Providers or APPs (Physician Assistants and Nurse Practitioners) who all work together to provide you with the care you need, when you need it.  Your next appointment:   Keep your post procedure follow up on 01/12/24 with Dr. Darron  We recommend signing up for the patient portal called MyChart.  Sign up information is provided on this After Visit Summary.  MyChart is used to connect with patients for Virtual Visits (Telemedicine).  Patients are able to view lab/test results, encounter notes, upcoming appointments, etc.  Non-urgent messages can be sent to your provider as well.   To learn more about what you can do with MyChart, go to [email or web address].   Other Instructions  [facility] DE[facility] OF [place] [place] [facility] AT [address] A DEPT OF THE . [facility] [address] [place] [address] Dept: [phone] Loc: [phone]  DIARA CHAUDHARI  11/24/2023  You are scheduled for a Peripheral Angiogram on Wednesday, October 29 with Dr. Deatrice Cage.  1. Please arrive at the [facility] (Main Entrance A) at [facility]: [address] [place], [place] [address] at 6:30 AM (This time is 2 hour(s) before your procedure to ensure your preparation).   Free valet parking service is available. You will check in at ADMITTING. The support person will be asked to wait in the waiting room.  It is OK to have someone drop you off and come back when you  are ready to be discharged.    Special note: Every effort is made to have your procedure done on time. Please understand that emergencies sometimes delay scheduled procedures.  2. Diet: Nothing to eat after midnight.   3. Hydration: You need to be well hydrated before your procedure. On October 29, you may drink approved liquids (see below) until 2 hours before the procedure, with 16 oz of water  as your last intake.   List of approved liquids water , clear juice, clear tea, black coffee, fruit juices, non-citric and without pulp, carbonated beverages, Gatorade, Kool -Aid, plain Jello-O and plain ice popsicles.  4. Labs: You will need to have blood drawn on 12/11/23. You do not need to be fasting.  5. Medication instructions in preparation for your procedure: Hold all diabetic medication the morning of the procedure  -hold the semaglutide  the morning of  -take half the dose of the Tresiba  the night before the procedure  -hold all insulin  the morning of the procedure Hold Furosemide  the morning of the procedure    On the morning of your procedure, take your Aspirin  81 mg and Plavix /Clopidogrel  and any morning medicines NOT listed above.  You may use sips of water .  6. Plan to go home the same day, you will only stay overnight if medically necessary. 7. Bring a current list of your medications and current insurance cards. 8. You MUST have a responsible person to drive you home. 9. Someone MUST be with you the first 24 hours after you arrive home or your discharge will be delayed. 10. Please wear clothes that are easy to get on and off and wear slip-on shoes.  Thank you for allowing us  to care for you!   -- [facility]

## 2023-11-30 ENCOUNTER — Other Ambulatory Visit: Payer: Self-pay | Admitting: *Deleted

## 2023-11-30 ENCOUNTER — Telehealth: Payer: Self-pay | Admitting: Family Medicine

## 2023-11-30 ENCOUNTER — Telehealth: Payer: Self-pay | Admitting: Cardiovascular Disease

## 2023-11-30 NOTE — Telephone Encounter (Signed)
 Appt already made spoke with the front staff her

## 2023-11-30 NOTE — Patient Instructions (Signed)
 Visit Information  Thank you for taking time to visit with me today. Please don't hesitate to contact me if I can be of assistance to you before our next scheduled telephone appointment.  Our next appointment is by telephone on 12/08/23 @ 1045 am  Following is a copy of your care plan:   Goals Addressed             This Visit's Progress    VBCI Transitions of Care (TOC) Care Plan       Problems:  Recent Hospitalization for treatment of acute hypoxic respiratory failure/ hypertensive crisis Pt lives alone, has neighbors that assist her if needed,  pt is weighing daily, checking CBG and monitoring blood pressure daily Pt reports she is taking blood pressure medication as prescribed 11/30/23- pt states she is doing well, feeling stronger, continues checking blood pressure, weight, blood sugar, oxygen saturation and keeping a log, no new concerns reported  Goal:  Over the next 30 days, the patient will not experience hospital readmission  Interventions:  Evaluation of current treatment plan related to acute hypoxic respiratory failure, self-management and patient's adherence to plan as established by provider. Discussed plans with patient for ongoing care management follow up and provided patient with direct contact information for care management t Verified pt is checking BP daily, keeping a log Reviewed normal parameters for BP Reinforced action plan for hypertension, HF Encouraged pt to continue checking BP, weight, CBG daily and keeping a log Reviewed energy conservation Reviewed safety precautions Reviewed carbohydrate modified diet Reviewed low sodium diet  Patient Self Care Activities:  Attend all scheduled provider appointments Call pharmacy for medication refills 3-7 days in advance of running out of medications Call provider office for new concerns or questions  Notify RN Care Manager of TOC call rescheduling needs Participate in Transition of Care Program/Attend TOC  scheduled calls Perform all self care activities independently  Perform IADL's (shopping, preparing meals, housekeeping, managing finances) independently Take medications as prescribed   Continue checking blood pressure, weight, blood sugar daily, keep a log and take to doctor's appointments  Plan:  Telephone follow up appointment with care management team member scheduled for:  12/08/23 @ 1045 am The patient has been provided with contact information for the care management team and has been advised to call with any health related questions or concerns.         Patient verbalizes understanding of instructions and care plan provided today and agrees to view in MyChart. Active MyChart status and patient understanding of how to access instructions and care plan via MyChart confirmed with patient.     Telephone follow up appointment with care management team member scheduled for:  12/08/23 @ 1045 am  Please call the care guide team at [phone] if you need to cancel or reschedule your appointment.   Please call the Suicide and Crisis Lifeline: 988 call the [place]  National Suicide Prevention Lifeline: 5800283780 or TTY: 313-001-8269 TTY 646-138-5151) to talk to a trained counselor call 1-800-273-TALK (toll free, 24 hour hotline) go to [facility] [address] [place] [phone]) call the [facility] Crisis Line: [phone] call 911 if you are experiencing a Mental Health or Behavioral Health Crisis or need someone to talk to.  Mliss Creed [facility], BSN RN Care Manager/ Transition of Care [place]/ [facility] [phone]

## 2023-11-30 NOTE — Patient Outreach (Signed)
 Transition of Care week 4  Visit Note  11/30/2023  Name: Leah Ferrell MRN: [id number]          DOB: Oct 15, 1950  Situation: Patient enrolled in [facility] 30-day program. Visit completed with patient by telephone.   Background:    Past Medical History:  Diagnosis Date   CAD (coronary artery disease)    a. 04/2012 NSTEMI: s/p DES to LAD.   Chronic diastolic CHF (congestive heart failure) (HCC)    Diabetes mellitus without complication (HCC)    Dysrhythmia    Environmental and seasonal allergies    Hyperlipidemia    Hypertension    Leukocytosis    Morbid obesity (HCC)    NSTEMI (non-ST elevated myocardial infarction) (HCC) 04/27/2012   PONV (postoperative nausea and vomiting)    PVD (peripheral vascular disease)    a. 04/2012 ABI: R 0.49, L 0.39. Angiography 06/14: Significant ostial left common iliac artery stenosis extending into the distal aorta, severe left common femoral artery stenosis, bilateral SFA occlusion with heavy calcifications. Functionally, one-vessel runoff bilaterally below the knee   Renal artery stenosis    s/p angioplasty/stenting bilateral renal arteries 03/16/19   Shortness of breath     Assessment: Patient Reported Symptoms: Cognitive Cognitive Status: No symptoms reported, Able to follow simple commands, Normal speech and language skills      Neurological Neurological Review of Symptoms: No symptoms reported    HEENT HEENT Symptoms Reported: No symptoms reported      Cardiovascular Cardiovascular Symptoms Reported: No symptoms reported Does patient have uncontrolled Hypertension?: No Is patient checking Blood Pressure at home?: Yes Cardiovascular Management Strategies: Medication therapy, Adequate rest, Routine screening Weight: 188 lb (85.3 kg) Cardiovascular Self-Management Outcome: 4 (good)  Respiratory Respiratory Symptoms Reported: No symptoms reported    Endocrine Endocrine Symptoms Reported: No symptoms reported Is patient diabetic?: Yes Is  patient checking blood sugars at home?: Yes List most recent blood sugar readings, include date and time of day: 3-4 x per day,  FBS 189 per pt Endocrine Self-Management Outcome: 4 (good) Endocrine Comment: reinforced carbohydrate modified diet  Gastrointestinal Gastrointestinal Symptoms Reported: No symptoms reported      Genitourinary Genitourinary Symptoms Reported: No symptoms reported    Integumentary Integumentary Symptoms Reported: No symptoms reported    Musculoskeletal Musculoskelatal Symptoms Reviewed: No symptoms reported        Psychosocial Psychosocial Symptoms Reported: No symptoms reported         Vitals:   11/30/23 1046  BP: 135/61    Medications Reviewed Today     Reviewed by Aura Mliss LABOR, RN (Registered Nurse) on 11/30/23 at 1045  Med List Status: <None>   Medication Order Taking? Sig Documenting Provider Last Dose Status Informant  acetaminophen  (TYLENOL ) 325 MG tablet [id number]  Take 2 tablets (650 mg total) by mouth every 6 (six) hours as needed for mild pain (pain score 1-3) (or Fever >/= 101). Pearlean Manus, MD  Active Self, Pharmacy Records  albuterol  (VENTOLIN  HFA) [phone] Base) MCG/ACT inhaler [id number]  Inhale 2 puffs into the lungs every 6 (six) hours as needed for shortness of breath or wheezing. Pearlean Manus, MD  Active Self, Pharmacy Records  Ascorbic Acid  (VITAMIN C) 1000 MG tablet [id number]  Take 1,000 mg by mouth 3 (three) times daily.  [provider]  Active Self, Pharmacy Records  aspirin  EC 81 MG tablet [id number]  Take 1 tablet (81 mg total) by mouth daily with breakfast. In the morning Pearlean Manus, MD  Active Self, Pharmacy  Records  BD ULTRA-FINE PEN NEEDLES 29G X 12.7MM MISC [id number]  USE PENNEEDLES 3 TIMES  DAILY AS DIRECTED Lavell Bari LABOR, FNP  Active Self, Pharmacy Records  blood glucose meter kit and supplies [id number]  Dispense based on patient and insurance preference. Use up to four times daily as directed.  (FOR ICD-10 E10.9, E11.9). Lavell Bari LABOR, FNP  Active Self, Pharmacy Records  Calcium  Carbonate-Vit D-Min (CALTRATE 600+D PLUS MINERALS) 600-800 MG-UNIT TABS [id number]  Take 1 tablet by mouth 3 (three) times daily.  [provider]  Active Self, Pharmacy Records  carvedilol  (COREG ) 25 MG tablet [id number]  Take 1 tablet (25 mg total) by mouth 2 (two) times daily with a meal. Pearlean Manus, MD  Expired 11/24/23 2359 Self, Pharmacy Records           Med Note STEFFI, [place]   Sat Nov 07, 2023  8:25 AM) If the patients blood pressure is 120 systolic, patient takes a half a tablet.   Cholecalciferol  (VITAMIN D -3) 125 MCG (5000 UT) TABS [id number]  Take 5,000 Units by mouth daily. [provider]  Active Self, Pharmacy Records  clopidogrel  (PLAVIX ) 75 MG tablet [id number]  Take 1 tablet (75 mg total) by mouth daily. Pearlean Manus, MD  Active Self, Pharmacy Records  fish oil-omega-3 fatty acids  1000 MG capsule [id number]  Take 1 g by mouth daily.  [provider]  Active Self, Pharmacy Records  furosemide  (LASIX ) 40 MG tablet [id number]  Take 1 tablet (40 mg total) by mouth daily. Darron Deatrice LABOR, MD  Active Self, Pharmacy Records  glucose blood (GLUCOSE METER TEST) test strip [id number]  Use 6-10 times a day, Uses [employer] Hawks, Bari LABOR, FNP  Active Self, Pharmacy Records  hydroxypropyl methylcellulose (ISOPTO TEARS) 2.5 % ophthalmic solution [id number]  Place 1 drop into both eyes 3 (three) times daily as needed (for dry eyes). [provider]  Active Self, Pharmacy Records  insulin  degludec (TRESIBA  FLEXTOUCH) 100 UNIT/ML FlexTouch Pen [id number]  Inject 20 Units into the skin at bedtime. Ricky Fines, MD  Active Self, Pharmacy Records  insulin  lispro (HUMALOG  [facility]) 200 UNIT/ML KwikPen [id number]  Inject 15-30 Units into the skin 3 (three) times daily before meals. Ricky Fines, MD  Active Self, Pharmacy Records  Insulin  Syringe 27G X 1/2 0.5 ML MISC  [id number]  Use with Fiasp  insulin  TID Lavell Bari LABOR, FNP  Active Self, Pharmacy Records  isosorbide  mononitrate (IMDUR ) 30 MG 24 hr tablet [id number]  Take 1 tablet (30 mg total) by mouth daily. Darron Deatrice LABOR, MD  Active   losartan  (COZAAR ) 100 MG tablet [id number]  Take 1 tablet (100 mg total) by mouth daily. Sigdel, Santosh, MD  Active   montelukast  (SINGULAIR ) 10 MG tablet [id number]  Take 1 tablet by mouth once daily with breakfast Lavell Bari A, FNP  Active   nitroGLYCERIN  (NITROSTAT ) 0.4 MG SL tablet [id number]  Place 1 tablet (0.4 mg total) under the tongue every 5 (five) minutes as needed for chest pain. Arlice Reichert, MD  Active Self, Pharmacy Records  oxybutynin  (DITROPAN -XL) 5 MG 24 hr tablet [id number]  Take 1 tablet (5 mg total) by mouth at bedtime. Lavell Bari LABOR, FNP  Active Self, Pharmacy Records           Med Note STEFFI, [place]   Sat Nov 07, 2023  8:26 AM)    polyethylene glycol (MIRALAX  / GLYCOLAX ) 17 g packet [id number]  Take 17 g by mouth daily as needed. Ricky Fines, MD  Active  Self, Pharmacy Records  potassium chloride  SA (KLOR-CON  M) 20 MEQ tablet [id number]  Take 1 tablet (20 mEq total) by mouth daily.  Patient not taking: Reported on 11/24/2023   Ricky Fines, MD  Active Self, Pharmacy Records  RELION PEN NEEDLES 29G X MISC [id number]  USE 1 NEEDLE TO INJECT INSULIN  SUBCUTANEOUSLY 3 TIMES DAILY Lavell Bari LABOR, FNP  Active Self, Pharmacy Records  rosuvastatin  (CRESTOR ) 40 MG tablet [id number]  Take 1 tablet (40 mg total) by mouth daily. Lavell Bari A, FNP  Active   Semaglutide , 2 MG/DOSE, (OZEMPIC , 2 MG/DOSE,) 8 MG/3ML SOPN [id number]  Inject 2 mg into the skin once a week. Lavell Bari LABOR, FNP  Active             Goals Addressed             This Visit's Progress    VBCI Transitions of Care (TOC) Care Plan       Problems:  Recent Hospitalization for treatment of acute hypoxic respiratory failure/ hypertensive crisis Pt lives alone, has  neighbors that assist her if needed,  pt is weighing daily, checking CBG and monitoring blood pressure daily Pt reports she is taking blood pressure medication as prescribed 11/30/23- pt states she is doing well, feeling stronger, continues checking blood pressure, weight, blood sugar, oxygen saturation and keeping a log, no new concerns reported  Goal:  Over the next 30 days, the patient will not experience hospital readmission  Interventions:  Evaluation of current treatment plan related to acute hypoxic respiratory failure, self-management and patient's adherence to plan as established by provider. Discussed plans with patient for ongoing care management follow up and provided patient with direct contact information for care management t Verified pt is checking BP daily, keeping a log Reviewed normal parameters for BP Reinforced action plan for hypertension, HF Encouraged pt to continue checking BP, weight, CBG daily and keeping a log Reviewed energy conservation Reviewed safety precautions Reviewed carbohydrate modified diet Reviewed low sodium diet  Patient Self Care Activities:  Attend all scheduled provider appointments Call pharmacy for medication refills 3-7 days in advance of running out of medications Call provider office for new concerns or questions  Notify RN Care Manager of TOC call rescheduling needs Participate in Transition of Care Program/Attend TOC scheduled calls Perform all self care activities independently  Perform IADL's (shopping, preparing meals, housekeeping, managing finances) independently Take medications as prescribed   Continue checking blood pressure, weight, blood sugar daily, keep a log and take to doctor's appointments  Plan:  Telephone follow up appointment with care management team member scheduled for:  12/08/23 @ 1045 am The patient has been provided with contact information for the care management team and has been advised to call with any  health related questions or concerns.          Recommendation:   PCP Follow-up  Follow Up Plan:   Telephone follow-up 12/08/23 @ 1045 am  Mliss Creed [facility], BSN RN Care Manager/ Transition of Care [place]/ [facility] [phone]

## 2023-11-30 NOTE — Telephone Encounter (Signed)
 Pt c/o medication issue:  1. Name of Medication:   isosorbide  mononitrate (IMDUR ) 30 MG 24 hr tablet   2. How are you currently taking this medication (dosage and times per day)?   3. Are you having a reaction (difficulty breathing--STAT)?   4. What is your medication issue?    Patient stated she was recently prescribed this medication and wants to know if she should still be taking the carvedilol  (COREG ) 25 MG tablet (Expired) medication in addition with this medication.

## 2023-11-30 NOTE — Telephone Encounter (Signed)
 Copied from CRM [id number]. Topic: Clinical - Lab/Test Results >> Nov 30, 2023  1:51 PM Emylou G wrote: Reason for CRM: Pls schedule needs blood sugar checked - I scheduled 10/24

## 2023-11-30 NOTE — Telephone Encounter (Signed)
 The patient has been made aware that the only medication change made on 11/24/23 office visit was the addition of the Imdur  30 mg once daily.

## 2023-12-01 NOTE — Telephone Encounter (Signed)
 Patient informed of Novolog  insulin  shipment for Novo Nordisk PAP.  She has surplus at home and would like for us  to store her supply until next month.  Patient is still having some post-prandial hyperglycemia.  Patient is nervous to change her basal/bolus regimen.  We need to increase her basal dose as well, however patient would like to continue current regimen.  Will discuss PAP with patient next month as well.  Jmari Pelc Dattero Lason [place]and, PharmD, BCACP, CPP Clinical Pharmacist, [facility]

## 2023-12-02 ENCOUNTER — Telehealth: Payer: Self-pay | Admitting: *Deleted

## 2023-12-02 NOTE — Telephone Encounter (Signed)
 Faxed Ozempic  2mg  dose pens & Tresiba   U200 flexpen refill form to [facility]

## 2023-12-02 NOTE — Telephone Encounter (Signed)
 NA/V full Tresiba  has arrived

## 2023-12-08 ENCOUNTER — Telehealth: Payer: Self-pay | Admitting: *Deleted

## 2023-12-08 ENCOUNTER — Encounter: Payer: Self-pay | Admitting: *Deleted

## 2023-12-08 NOTE — Patient Instructions (Signed)
 Visit Information  Thank you for taking time to visit with me today. Please don't hesitate to contact me if I can be of assistance to you before our next scheduled telephone appointment.  Our next appointment is no further scheduled appointments.    Following is a copy of your care plan:   Goals Addressed             This Visit's Progress    COMPLETED: VBCI Transitions of Care (TOC) Care Plan       Problems:  Recent Hospitalization for treatment of acute hypoxic respiratory failure/ hypertensive crisis Pt lives alone, has neighbors that assist her if needed,  pt is weighing daily, checking CBG and monitoring blood pressure daily Pt reports she is taking blood pressure medication as prescribed 12/08/23- pt states she is doing well, feeling well, continues checking blood pressure, weight, blood sugar, oxygen saturation and keeping a log, no new concerns reported  Goal:  Over the next 30 days, the patient will not experience hospital readmission  Interventions:  Evaluation of current treatment plan related to acute hypoxic respiratory failure, self-management and patient's adherence to plan as established by provider. Discussed plans with patient for ongoing care management follow up and provided patient with direct contact information for care management t Verified pt is checking BP daily, keeping a log Reviewed normal parameters for BP Reviewed action plan for hypertension, HF Encouraged pt to continue checking BP, weight, CBG daily and keeping a log Reviewed energy conservation Reinforced safety precautions Reinforced carbohydrate modified diet Reinforced low sodium diet Reviewed plan of care with patient including case closure  Patient Self Care Activities:  Attend all scheduled provider appointments Call pharmacy for medication refills 3-7 days in advance of running out of medications Call provider office for new concerns or questions  Notify RN Care Manager of TOC call  rescheduling needs Participate in Transition of Care Program/Attend TOC scheduled calls Perform all self care activities independently  Perform IADL's (shopping, preparing meals, housekeeping, managing finances) independently Take medications as prescribed   Continue checking blood pressure, weight, blood sugar daily, keep a log and take to doctor's appointments TOC case closure  Plan:  No further follow up required: case closure        Patient verbalizes understanding of instructions and care plan provided today and agrees to view in MyChart. Active MyChart status and patient understanding of how to access instructions and care plan via MyChart confirmed with patient.    No further follow up required: case closure  Please call the care guide team at [phone] if you need to cancel or reschedule your appointment.   Please call the Suicide and Crisis Lifeline: 988 call the [place]  National Suicide Prevention Lifeline: (303)217-6563 or TTY: 713 125 0285 TTY (639)497-9033) to talk to a trained counselor call 1-800-273-TALK (toll free, 24 hour hotline) go to [facility] [address], [place] [phone]) call the [facility] Crisis Line: [phone] call 911 if you are experiencing a Mental Health or Behavioral Health Crisis or need someone to talk to.  Mliss Creed [facility], BSN RN Care Manager/ Transition of Care [place]/ [facility] [phone]

## 2023-12-08 NOTE — Patient Outreach (Signed)
 Transition of Care week 5  Visit Note  12/08/2023  Name: Leah Ferrell MRN: [id number]          DOB: 08-30-1950  Situation: Patient enrolled in [facility] 30-day program. Visit completed with patient by telephone.   Background: Discharge Date and Diagnosis: 11/08/23, Acute respiratory failure with hypoxia   Past Medical History:  Diagnosis Date   CAD (coronary artery disease)    a. 04/2012 NSTEMI: s/p DES to LAD.   Chronic diastolic CHF (congestive heart failure) (HCC)    Diabetes mellitus without complication (HCC)    Dysrhythmia    Environmental and seasonal allergies    Hyperlipidemia    Hypertension    Leukocytosis    Morbid obesity (HCC)    NSTEMI (non-ST elevated myocardial infarction) (HCC) 04/27/2012   PONV (postoperative nausea and vomiting)    PVD (peripheral vascular disease)    a. 04/2012 ABI: R 0.49, L 0.39. Angiography 06/14: Significant ostial left common iliac artery stenosis extending into the distal aorta, severe left common femoral artery stenosis, bilateral SFA occlusion with heavy calcifications. Functionally, one-vessel runoff bilaterally below the knee   Renal artery stenosis    s/p angioplasty/stenting bilateral renal arteries 03/16/19   Shortness of breath     Assessment: Patient Reported Symptoms: Cognitive Cognitive Status: No symptoms reported, Alert and oriented to person, place, and time, Normal speech and language skills      Neurological Neurological Review of Symptoms: No symptoms reported    HEENT HEENT Symptoms Reported: No symptoms reported      Cardiovascular Cardiovascular Symptoms Reported: No symptoms reported Does patient have uncontrolled Hypertension?: No Is patient checking Blood Pressure at home?: Yes Patient's Recent BP reading at home: 136/59 Cardiovascular Management Strategies: Medication therapy, Adequate rest, Routine screening Weight: 189 lb (85.7 kg) Cardiovascular Self-Management Outcome: 4 (good) Cardiovascular  Comment: reinforced low sodium diet, normal parameters for BP  Respiratory Respiratory Symptoms Reported: No symptoms reported    Endocrine Endocrine Symptoms Reported: No symptoms reported Is patient diabetic?: Yes Is patient checking blood sugars at home?: Yes List most recent blood sugar readings, include date and time of day: 3-4 x per day,  RBS 213 and 132 for today Endocrine Self-Management Outcome: 4 (good) Endocrine Comment: reviewed carboydrate modified diet  Gastrointestinal Gastrointestinal Symptoms Reported: No symptoms reported      Genitourinary Genitourinary Symptoms Reported: No symptoms reported    Integumentary Integumentary Symptoms Reported: No symptoms reported    Musculoskeletal Musculoskelatal Symptoms Reviewed: No symptoms reported        Psychosocial Psychosocial Symptoms Reported: No symptoms reported         Vitals:   12/08/23 1458  BP: (!) 136/59    Medications Reviewed Today     Reviewed by Aura Mliss LABOR, RN (Registered Nurse) on 12/08/23 at 1458  Med List Status: <None>   Medication Order Taking? Sig Documenting Provider Last Dose Status Informant  acetaminophen  (TYLENOL ) 325 MG tablet [id number]  Take 2 tablets (650 mg total) by mouth every 6 (six) hours as needed for mild pain (pain score 1-3) (or Fever >/= 101). Pearlean Manus, MD  Active Self, Pharmacy Records  albuterol  (VENTOLIN  HFA) 108 [phone] Base) MCG/ACT inhaler [id number]  Inhale 2 puffs into the lungs every 6 (six) hours as needed for shortness of breath or wheezing. Pearlean Manus, MD  Active Self, Pharmacy Records  Ascorbic Acid  (VITAMIN C) 1000 MG tablet [id number]  Take 1,000 mg by mouth 3 (three) times daily.  [provider]  Active  Self, Pharmacy Records  aspirin  EC 81 MG tablet [id number]  Take 1 tablet (81 mg total) by mouth daily with breakfast. In the morning Pearlean Manus, MD  Active Self, Pharmacy Records  BD ULTRA-FINE PEN NEEDLES 29G X 12.7MM MISC [id number]   USE PENNEEDLES 3 TIMES  DAILY AS DIRECTED Lavell Bari LABOR, FNP  Active Self, Pharmacy Records  blood glucose meter kit and supplies [id number]  Dispense based on patient and insurance preference. Use up to four times daily as directed. (FOR ICD-10 E10.9, E11.9). Lavell Bari LABOR, FNP  Active Self, Pharmacy Records  Calcium  Carbonate-Vit D-Min (CALTRATE 600+D PLUS MINERALS) 600-800 MG-UNIT TABS [id number]  Take 1 tablet by mouth 3 (three) times daily.  [provider]  Active Self, Pharmacy Records  carvedilol  (COREG ) 25 MG tablet [id number]  Take 1 tablet (25 mg total) by mouth 2 (two) times daily with a meal. Pearlean Manus, MD  Expired 11/24/23 2359 Self, Pharmacy Records           Med Note STEFFI, [place]   Sat Nov 07, 2023  8:25 AM) If the patients blood pressure is 120 systolic, patient takes a half a tablet.   Cholecalciferol  (VITAMIN D -3) 125 MCG (5000 UT) TABS [id number]  Take 5,000 Units by mouth daily. [provider]  Active Self, Pharmacy Records  clopidogrel  (PLAVIX ) 75 MG tablet [id number]  Take 1 tablet (75 mg total) by mouth daily. Pearlean Manus, MD  Active Self, Pharmacy Records  fish oil-omega-3 fatty acids  1000 MG capsule [id number]  Take 1 g by mouth daily.  [provider]  Active Self, Pharmacy Records  furosemide  (LASIX ) 40 MG tablet [id number]  Take 1 tablet (40 mg total) by mouth daily. Darron Deatrice LABOR, MD  Active Self, Pharmacy Records  glucose blood (GLUCOSE METER TEST) test strip [id number]  Use 6-10 times a day, Uses [employer], Bari LABOR, FNP  Active Self, Pharmacy Records  hydroxypropyl methylcellulose (ISOPTO TEARS) 2.5 % ophthalmic solution [id number]  Place 1 drop into both eyes 3 (three) times daily as needed (for dry eyes). [provider]  Active Self, Pharmacy Records  insulin  degludec (TRESIBA  FLEXTOUCH) 100 UNIT/ML FlexTouch Pen [id number]  Inject 20 Units into the skin at bedtime. Ricky Fines, MD  Active Self, Pharmacy  Records  insulin  lispro (HUMALOG  [facility]) 200 UNIT/ML KwikPen [id number]  Inject 15-30 Units into the skin 3 (three) times daily before meals. Ricky Fines, MD  Active Self, Pharmacy Records  Insulin  Syringe 27G X 1/2 0.5 ML MISC [id number]  Use with Fiasp  insulin  TID Lavell Bari LABOR, FNP  Active Self, Pharmacy Records  isosorbide  mononitrate (IMDUR ) 30 MG 24 hr tablet [id number]  Take 1 tablet (30 mg total) by mouth daily. Darron Deatrice LABOR, MD  Active   losartan  (COZAAR ) 100 MG tablet [id number]  Take 1 tablet (100 mg total) by mouth daily. Sigdel, Santosh, MD  Active   montelukast  (SINGULAIR ) 10 MG tablet [id number]  Take 1 tablet by mouth once daily with breakfast Lavell Bari A, FNP  Active   nitroGLYCERIN  (NITROSTAT ) 0.4 MG SL tablet [id number]  Place 1 tablet (0.4 mg total) under the tongue every 5 (five) minutes as needed for chest pain. Arlice Reichert, MD  Active Self, Pharmacy Records  oxybutynin  (DITROPAN -XL) 5 MG 24 hr tablet [id number]  Take 1 tablet (5 mg total) by mouth at bedtime. Lavell Bari LABOR, FNP  Active Self, Pharmacy Records           Med Note [place], [place]   Dju  Nov 07, 2023  8:26 AM)    polyethylene glycol (MIRALAX  / GLYCOLAX ) 17 g packet [id number]  Take 17 g by mouth daily as needed. Ricky Fines, MD  Active Self, Pharmacy Records  potassium chloride  SA (KLOR-CON  M) 20 MEQ tablet [id number]  Take 1 tablet (20 mEq total) by mouth daily.  Patient not taking: Reported on 11/24/2023   Ricky Fines, MD  Active Self, Pharmacy Records  RELION PEN NEEDLES 29G X MISC [id number]  USE 1 NEEDLE TO INJECT INSULIN  SUBCUTANEOUSLY 3 TIMES DAILY Lavell Bari LABOR, FNP  Active Self, Pharmacy Records  rosuvastatin  (CRESTOR ) 40 MG tablet [id number]  Take 1 tablet (40 mg total) by mouth daily. Lavell Bari A, FNP  Active   Semaglutide , 2 MG/DOSE, (OZEMPIC , 2 MG/DOSE,) 8 MG/3ML SOPN [id number]  Inject 2 mg into the skin once a week. Lavell Bari LABOR, FNP  Active              Goals Addressed             This Visit's Progress    COMPLETED: [facility]I Transitions of Care (TOC) Care Plan       Problems:  Recent Hospitalization for treatment of acute hypoxic respiratory failure/ hypertensive crisis Pt lives alone, has neighbors that assist her if needed,  pt is weighing daily, checking CBG and monitoring blood pressure daily Pt reports she is taking blood pressure medication as prescribed 12/08/23- pt states she is doing well, feeling well, continues checking blood pressure, weight, blood sugar, oxygen saturation and keeping a log, no new concerns reported  Goal:  Over the next 30 days, the patient will not experience hospital readmission  Interventions:  Evaluation of current treatment plan related to acute hypoxic respiratory failure, self-management and patient's adherence to plan as established by provider. Discussed plans with patient for ongoing care management follow up and provided patient with direct contact information for care management t Verified pt is checking BP daily, keeping a log Reviewed normal parameters for BP Reviewed action plan for hypertension, HF Encouraged pt to continue checking BP, weight, CBG daily and keeping a log Reviewed energy conservation Reinforced safety precautions Reinforced carbohydrate modified diet Reinforced low sodium diet Reviewed plan of care with patient including case closure  Patient Self Care Activities:  Attend all scheduled provider appointments Call pharmacy for medication refills 3-7 days in advance of running out of medications Call provider office for new concerns or questions  Notify RN Care Manager of TOC call rescheduling needs Participate in Transition of Care Program/Attend TOC scheduled calls Perform all self care activities independently  Perform IADL's (shopping, preparing meals, housekeeping, managing finances) independently Take medications as prescribed   Continue checking blood pressure,  weight, blood sugar daily, keep a log and take to doctor's appointments TOC case closure  Plan:  No further follow up required: case closure         Recommendation:   PCP Follow-up  Follow Up Plan:   Closing From:  Transitions of Care Program  Mliss Creed [facility], BSN RN Care Manager/ Transition of Care / [facility] [phone]

## 2023-12-11 ENCOUNTER — Other Ambulatory Visit

## 2023-12-11 ENCOUNTER — Ambulatory Visit

## 2023-12-11 ENCOUNTER — Ambulatory Visit (INDEPENDENT_AMBULATORY_CARE_PROVIDER_SITE_OTHER): Admitting: Family

## 2023-12-11 ENCOUNTER — Encounter: Payer: Self-pay | Admitting: Family

## 2023-12-11 ENCOUNTER — Telehealth: Payer: Self-pay | Admitting: Pharmacist

## 2023-12-11 ENCOUNTER — Other Ambulatory Visit: Payer: Self-pay | Admitting: Family Medicine

## 2023-12-11 VITALS — BP 161/61 | HR 57 | Temp 97.5°F | Ht 62.0 in

## 2023-12-11 DIAGNOSIS — E1151 Type 2 diabetes mellitus with diabetic peripheral angiopathy without gangrene: Secondary | ICD-10-CM

## 2023-12-11 DIAGNOSIS — I160 Hypertensive urgency: Secondary | ICD-10-CM | POA: Diagnosis not present

## 2023-12-11 DIAGNOSIS — I5032 Chronic diastolic (congestive) heart failure: Secondary | ICD-10-CM | POA: Diagnosis not present

## 2023-12-11 DIAGNOSIS — I150 Renovascular hypertension: Secondary | ICD-10-CM | POA: Diagnosis not present

## 2023-12-11 NOTE — Telephone Encounter (Signed)
 APPLICATION FOR EXTRA HELP/LIS WITH MEDICARE SUBMITTED WITH PATIENT TODAY SHE IS LIKELY OVER THE INCOME, BUT WE NEED DENIAL LETTER FOR NOVONORDISK Ozempic  shipment 2mg  arrived to clinic   FBG 160, Improved    Mliss Tarry Griffin, PharmD, [place], CPP Clinical Pharmacist, [facility]

## 2023-12-11 NOTE — Patient Instructions (Signed)
 Hypertension, Adult High blood pressure (hypertension) is when the force of blood pumping through the arteries is too strong. The arteries are the blood vessels that carry blood from the heart throughout the body. Hypertension forces the heart to work harder to pump blood and may cause arteries to become narrow or stiff. Untreated or uncontrolled hypertension can lead to a heart attack, heart failure, a stroke, kidney disease, and other problems. A blood pressure reading consists of a higher number over a lower number. Ideally, your blood pressure should be below 120/80. The first ("top") number is called the systolic pressure. It is a measure of the pressure in your arteries as your heart beats. The second ("bottom") number is called the diastolic pressure. It is a measure of the pressure in your arteries as the heart relaxes. What are the causes? The exact cause of this condition is not known. There are some conditions that result in high blood pressure. What increases the risk? Certain factors may make you more likely to develop high blood pressure. Some of these risk factors are under your control, including: Smoking. Not getting enough exercise or physical activity. Being overweight. Having too much fat, sugar, calories, or salt (sodium) in your diet. Drinking too much alcohol. Other risk factors include: Having a personal history of heart disease, diabetes, high cholesterol, or kidney disease. Stress. Having a family history of high blood pressure and high cholesterol. Having obstructive sleep apnea. Age. The risk increases with age. What are the signs or symptoms? High blood pressure may not cause symptoms. Very high blood pressure (hypertensive crisis) may cause: Headache. Fast or irregular heartbeats (palpitations). Shortness of breath. Nosebleed. Nausea and vomiting. Vision changes. Severe chest pain, dizziness, and seizures. How is this diagnosed? This condition is diagnosed by  measuring your blood pressure while you are seated, with your arm resting on a flat surface, your legs uncrossed, and your feet flat on the floor. The cuff of the blood pressure monitor will be placed directly against the skin of your upper arm at the level of your heart. Blood pressure should be measured at least twice using the same arm. Certain conditions can cause a difference in blood pressure between your right and left arms. If you have a high blood pressure reading during one visit or you have normal blood pressure with other risk factors, you may be asked to: Return on a different day to have your blood pressure checked again. Monitor your blood pressure at home for 1 week or longer. If you are diagnosed with hypertension, you may have other blood or imaging tests to help your health care provider understand your overall risk for other conditions. How is this treated? This condition is treated by making healthy lifestyle changes, such as eating healthy foods, exercising more, and reducing your alcohol intake. You may be referred for counseling on a healthy diet and physical activity. Your health care provider may prescribe medicine if lifestyle changes are not enough to get your blood pressure under control and if: Your systolic blood pressure is above 130. Your diastolic blood pressure is above 80. Your personal target blood pressure may vary depending on your medical conditions, your age, and other factors. Follow these instructions at home: Eating and drinking  Eat a diet that is high in fiber and potassium, and low in sodium, added sugar, and fat. An example of this eating plan is called the DASH diet. DASH stands for Dietary Approaches to Stop Hypertension. To eat this way: Eat  plenty of fresh fruits and vegetables. Try to fill one half of your plate at each meal with fruits and vegetables. Eat whole grains, such as whole-wheat pasta, brown rice, or whole-grain bread. Fill about one  fourth of your plate with whole grains. Eat or drink low-fat dairy products, such as skim milk or low-fat yogurt. Avoid fatty cuts of meat, processed or cured meats, and poultry with skin. Fill about one fourth of your plate with lean proteins, such as fish, chicken without skin, beans, eggs, or tofu. Avoid pre-made and processed foods. These tend to be higher in sodium, added sugar, and fat. Reduce your daily sodium intake. Many people with hypertension should eat less than 1,500 mg of sodium a day. Do not drink alcohol if: Your health care provider tells you not to drink. You are pregnant, may be pregnant, or are planning to become pregnant. If you drink alcohol: Limit how much you have to: 0-1 drink a day for women. 0-2 drinks a day for men. Know how much alcohol is in your drink. In the [place] one drink equals one 12 oz bottle of beer (355 mL), one 5 oz glass of wine (148 mL), or one 1 oz glass of hard liquor (44 mL). Lifestyle  Work with your health care provider to maintain a healthy body weight or to lose weight. Ask what an ideal weight is for you. Get at least 30 minutes of exercise that causes your heart to beat faster (aerobic exercise) most days of the week. Activities may include walking, swimming, or biking. Include exercise to strengthen your muscles (resistance exercise), such as Pilates or lifting weights, as part of your weekly exercise routine. Try to do these types of exercises for 30 minutes at least 3 days a week. Do not use any products that contain nicotine or tobacco. These products include cigarettes, chewing tobacco, and vaping devices, such as e-cigarettes. If you need help quitting, ask your health care provider. Monitor your blood pressure at home as told by your health care provider. Keep all follow-up visits. This is important. Medicines Take over-the-counter and prescription medicines only as told by your health care provider. Follow directions carefully. Blood  pressure medicines must be taken as prescribed. Do not skip doses of blood pressure medicine. Doing this puts you at risk for problems and can make the medicine less effective. Ask your health care provider about side effects or reactions to medicines that you should watch for. Contact a health care provider if you: Think you are having a reaction to a medicine you are taking. Have headaches that keep coming back (recurring). Feel dizzy. Have swelling in your ankles. Have trouble with your vision. Get help right away if you: Develop a severe headache or confusion. Have unusual weakness or numbness. Feel faint. Have severe pain in your chest or abdomen. Vomit repeatedly. Have trouble breathing. These symptoms may be an emergency. Get help right away. Call 911. Do not wait to see if the symptoms will go away. Do not drive yourself to the hospital. Summary Hypertension is when the force of blood pumping through your arteries is too strong. If this condition is not controlled, it may put you at risk for serious complications. Your personal target blood pressure may vary depending on your medical conditions, your age, and other factors. For most people, a normal blood pressure is less than 120/80. Hypertension is treated with lifestyle changes, medicines, or a combination of both. Lifestyle changes include losing weight, eating a healthy,  low-sodium diet, exercising more, and limiting alcohol. This information is not intended to replace advice given to you by your health care provider. Make sure you discuss any questions you have with your health care provider. Document Revised: 12/11/2020 Document Reviewed: 12/11/2020 Elsevier Patient Education  2024 [employer].

## 2023-12-11 NOTE — Progress Notes (Signed)
 Subjective:    Patient ID: Leah Ferrell, female    DOB: January 04, 1951, 73 y.o.   MRN: [id number]  Chief Complaint  Patient presents with   Hypertension   Pt presents to the office today with elevated BP. Her BP today is 200/63. She has taken, Imdur  30 mg, lasix  40 mg BID, and carvedilol  25 mg BID. She reports she only takes the Losartan  100 mg when her BP is elevated and has not taken this today.  Hypertension This is a chronic problem. The current episode started more than 1 year ago. The problem has been waxing and waning since onset. The problem is uncontrolled. Associated symptoms include malaise/fatigue and peripheral edema. Pertinent negatives include no palpitations. Risk factors for coronary artery disease include sedentary lifestyle, obesity, dyslipidemia and diabetes mellitus. The current treatment provides no improvement. Compliance problems include exercise.  Hypertensive end-organ damage includes kidney disease, CAD/MI and heart failure.      Review of Systems  Constitutional:  Positive for malaise/fatigue.  Cardiovascular:  Negative for palpitations.  All other systems reviewed and are negative.   Social History   Socioeconomic History   Marital status: Widowed    Spouse name: Not on file   Number of children: 4   Years of education: completed school in [place]    Highest education level: Not on file  Occupational History   Occupation: retired  Tobacco Use   Smoking status: Former    Current packs/day: 0.00    Average packs/day: 1 pack/day for 36.8 years (36.8 ttl pk-yrs)    Types: Cigarettes    Start date: 04/21/1964    Quit date: 02/17/2001    Years since quitting: 22.8   Smokeless tobacco: Never  Vaping Use   Vaping status: Never Used  Substance and Sexual Activity   Alcohol  use: No    Alcohol /week: 0.0 standard drinks of alcohol    Drug use: No   Sexual activity: Not Currently    Birth control/protection: Post-menopausal  Other Topics Concern   Not on  file  Social History Narrative   Lives alone - one level - all of her children live far away   Social Drivers of Health   Financial Resource Strain: Medium Risk (08/17/2023)   Overall Financial Resource Strain (CARDIA)    Difficulty of Paying Living Expenses: Somewhat hard  Food Insecurity: No Food Insecurity (11/09/2023)   Hunger Vital Sign    Worried About Running Out of Food in the Last Year: Never true    Ran Out of Food in the Last Year: Never true  Transportation Needs: No Transportation Needs (11/09/2023)   PRAPARE - Administrator, Civil Service (Medical): No    Lack of Transportation (Non-Medical): No  Physical Activity: Insufficiently Active (12/25/2022)   Exercise Vital Sign    Days of Exercise per Week: 3 days    Minutes of Exercise per Session: 30 min  Stress: No Stress Concern Present (12/25/2022)   [employer] of Occupational Health - Occupational Stress Questionnaire    Feeling of Stress : Not at all  Social Connections: Moderately Isolated (08/15/2023)   Social Connection and Isolation Panel    Frequency of Communication with Friends and Family: More than three times a week    Frequency of Social Gatherings with Friends and Family: More than three times a week    Attends Religious Services: 1 to 4 times per year    Active Member of [employer] or Organizations: No    Attends [employer]  or Organization Meetings: Never    Marital Status: Widowed   Family History  Problem Relation Age of Onset   Diabetes Mother    Hyperlipidemia Mother    Hypertension Mother    Peripheral vascular disease Mother    Other Sister        drowned   Other Sister        drowned   Diabetes Daughter        borderline   Hypertension Daughter    Diabetes Maternal Grandmother    Heart attack Maternal Grandfather    Heart disease Brother    Diabetes Brother    Alcohol  abuse Brother    Diabetes Son    Hypertension Son    Hyperlipidemia Son    Breast cancer Neg Hx          Objective:   Physical Exam Vitals reviewed.  Constitutional:      General: She is not in acute distress.    Appearance: She is well-developed.  HENT:     Head: Normocephalic and atraumatic.  Eyes:     Pupils: Pupils are equal, round, and reactive to light.  Neck:     Thyroid : No thyromegaly.  Cardiovascular:     Rate and Rhythm: Normal rate and regular rhythm.     Heart sounds: Normal heart sounds. No murmur heard. Pulmonary:     Effort: Pulmonary effort is normal. No respiratory distress.     Breath sounds: Normal breath sounds. No wheezing.  Abdominal:     General: Bowel sounds are normal. There is no distension.     Palpations: Abdomen is soft.     Tenderness: There is no abdominal tenderness.  Musculoskeletal:        General: No tenderness. Normal range of motion.     Cervical back: Normal range of motion and neck supple.  Skin:    General: Skin is warm and dry.  Neurological:     Mental Status: She is alert and oriented to person, place, and time.     Cranial Nerves: No cranial nerve deficit.     Deep Tendon Reflexes: Reflexes are normal and symmetric.  Psychiatric:        Behavior: Behavior normal.        Thought Content: Thought content normal.        Judgment: Judgment normal.       BP (!) 210/63   Pulse (!) 57   Temp (!) 97.5 F (36.4 C) (Temporal)   Ht 5' 2 (1.575 m)   SpO2 98%   BMI 34.57 kg/m      Assessment & Plan:  Leah Ferrell comes in today with chief complaint of Hypertension   Diagnosis and orders addressed:  1. Hypertensive urgency (Primary) BP improving after taking BP medication Recommend taking her losartan  100 mg  Go home and rest Continue to monitor BP at home -[employer] information given -Exercise encouraged - Stress Management  Keep Cardiologists follow up Approx 40 mins spent with patient, education, and monitoring BP   Bari Learn, FNP

## 2023-12-12 ENCOUNTER — Other Ambulatory Visit: Payer: Self-pay | Admitting: Family

## 2023-12-12 LAB — BASIC METABOLIC PANEL WITH GFR
BUN/Creatinine Ratio: 16 (ref 12–28)
BUN: 14 mg/dL (ref 8–27)
CO2: 30 mmol/L — ABNORMAL HIGH (ref 20–29)
Calcium: 9.9 mg/dL (ref 8.7–10.3)
Chloride: 93 mmol/L — ABNORMAL LOW (ref 96–106)
Creatinine, Ser: 0.9 mg/dL (ref 0.57–1.00)
Glucose: 183 mg/dL — ABNORMAL HIGH (ref 70–99)
Potassium: 4.5 mmol/L (ref 3.5–5.2)
Sodium: 138 mmol/L (ref 134–144)
eGFR: 68 mL/min/1.73 (ref >59)

## 2023-12-12 LAB — CBC
Hematocrit: 43.3 % (ref 34.0–46.6)
Hemoglobin: 13.6 g/dL (ref 11.1–15.9)
MCH: 30.8 pg (ref 26.6–33.0)
MCHC: 31.4 g/dL — ABNORMAL LOW (ref 31.5–35.7)
MCV: 98 fL — ABNORMAL HIGH (ref 79–97)
Platelets: 247 x10E3/uL (ref 150–450)
RBC: 4.41 x10E6/uL (ref 3.77–5.28)
RDW: 14.5 % (ref 11.7–15.4)
WBC: 9.9 x10E3/uL (ref 3.4–10.8)

## 2023-12-14 ENCOUNTER — Telehealth: Payer: Self-pay | Admitting: *Deleted

## 2023-12-14 ENCOUNTER — Ambulatory Visit: Payer: Self-pay | Admitting: Cardiovascular Disease

## 2023-12-14 NOTE — Telephone Encounter (Signed)
 Renal Angiogram scheduled at [facility] for: Wednesday December 15, 2023 8 AM-this is time change per Cath Lab schedule Arrival time [facility] Main Entrance A at: 6 AM  Diet: -Nothing to eat after midnight.  Hydration: -May drink clear liquids until 2 hours before the procedure.  Approved liquids: Water , clear tea, black coffee, fruit juices-non-citric and without pulp,Gatorade, plain Jello/popsicles.   -Please drink 16 oz of water  2 hours before procedure.  Medication instructions: -Hold:  Lasix /KCl-AM of procedure   Insulin -AM of procedure 1/2 usual Insulin  dose PM prior to procedure -Other usual morning medications can be taken including aspirin  81 mg and Plavix  75 mg.  Plan to go home the same day, you will only stay overnight if medically necessary.  You must have responsible adult to drive you home.  Someone must be with you the first 24 hours after you arrive home.  Reviewed procedure instructions with patient.

## 2023-12-16 ENCOUNTER — Other Ambulatory Visit: Payer: Self-pay | Admitting: *Deleted

## 2023-12-16 ENCOUNTER — Other Ambulatory Visit: Payer: Self-pay

## 2023-12-16 ENCOUNTER — Ambulatory Visit (HOSPITAL_COMMUNITY): Admission: RE | Disposition: A | Payer: Self-pay | Source: Home / Self Care | Attending: Cardiovascular Disease

## 2023-12-16 ENCOUNTER — Encounter (HOSPITAL_COMMUNITY): Payer: Self-pay | Admitting: Cardiovascular Disease

## 2023-12-16 ENCOUNTER — Ambulatory Visit (HOSPITAL_COMMUNITY)
Admission: RE | Admit: 2023-12-16 | Discharge: 2023-12-16 | Disposition: A | Discharge status: Home / Self Care | Attending: Cardiovascular Disease | Admitting: Cardiovascular Disease

## 2023-12-16 DIAGNOSIS — Z955 Presence of coronary angioplasty implant and graft: Secondary | ICD-10-CM | POA: Insufficient documentation

## 2023-12-16 DIAGNOSIS — E1151 Type 2 diabetes mellitus with diabetic peripheral angiopathy without gangrene: Secondary | ICD-10-CM | POA: Diagnosis not present

## 2023-12-16 DIAGNOSIS — T82856A Stenosis of peripheral vascular stent, initial encounter: Secondary | ICD-10-CM | POA: Diagnosis not present

## 2023-12-16 DIAGNOSIS — I701 Atherosclerosis of renal artery: Secondary | ICD-10-CM | POA: Insufficient documentation

## 2023-12-16 DIAGNOSIS — I110 Hypertensive heart disease with heart failure: Secondary | ICD-10-CM | POA: Diagnosis not present

## 2023-12-16 DIAGNOSIS — Y839 Surgical procedure, unspecified as the cause of abnormal reaction of the patient, or of later complication, without mention of misadventure at the time of the procedure: Secondary | ICD-10-CM | POA: Diagnosis not present

## 2023-12-16 DIAGNOSIS — E785 Hyperlipidemia, unspecified: Secondary | ICD-10-CM | POA: Insufficient documentation

## 2023-12-16 DIAGNOSIS — Z8249 Family history of ischemic heart disease and other diseases of the circulatory system: Secondary | ICD-10-CM | POA: Diagnosis not present

## 2023-12-16 DIAGNOSIS — Z951 Presence of aortocoronary bypass graft: Secondary | ICD-10-CM | POA: Diagnosis not present

## 2023-12-16 DIAGNOSIS — Z7982 Long term (current) use of aspirin: Secondary | ICD-10-CM | POA: Diagnosis not present

## 2023-12-16 DIAGNOSIS — I5032 Chronic diastolic (congestive) heart failure: Secondary | ICD-10-CM | POA: Diagnosis not present

## 2023-12-16 DIAGNOSIS — I25118 Atherosclerotic heart disease of native coronary artery with other forms of angina pectoris: Secondary | ICD-10-CM | POA: Diagnosis not present

## 2023-12-16 DIAGNOSIS — Z794 Long term (current) use of insulin: Secondary | ICD-10-CM | POA: Insufficient documentation

## 2023-12-16 DIAGNOSIS — I708 Atherosclerosis of other arteries: Secondary | ICD-10-CM | POA: Insufficient documentation

## 2023-12-16 DIAGNOSIS — Z87891 Personal history of nicotine dependence: Secondary | ICD-10-CM | POA: Insufficient documentation

## 2023-12-16 DIAGNOSIS — Z7902 Long term (current) use of antithrombotics/antiplatelets: Secondary | ICD-10-CM | POA: Insufficient documentation

## 2023-12-16 DIAGNOSIS — I70213 Atherosclerosis of native arteries of extremities with intermittent claudication, bilateral legs: Secondary | ICD-10-CM | POA: Insufficient documentation

## 2023-12-16 DIAGNOSIS — I252 Old myocardial infarction: Secondary | ICD-10-CM | POA: Insufficient documentation

## 2023-12-16 DIAGNOSIS — Z79899 Other long term (current) drug therapy: Secondary | ICD-10-CM | POA: Diagnosis not present

## 2023-12-16 DIAGNOSIS — Z95820 Peripheral vascular angioplasty status with implants and grafts: Secondary | ICD-10-CM | POA: Diagnosis not present

## 2023-12-16 DIAGNOSIS — Z7985 Long-term (current) use of injectable non-insulin antidiabetic drugs: Secondary | ICD-10-CM | POA: Insufficient documentation

## 2023-12-16 DIAGNOSIS — I150 Renovascular hypertension: Secondary | ICD-10-CM | POA: Insufficient documentation

## 2023-12-16 HISTORY — PX: RENAL INTERVENTION: CATH118261

## 2023-12-16 HISTORY — PX: RENAL ANGIOGRAPHY: CATH118260

## 2023-12-16 LAB — GLUCOSE, CAPILLARY
Glucose-Capillary: 150 mg/dL — ABNORMAL HIGH (ref 70–99)
Glucose-Capillary: 121 mg/dL — ABNORMAL HIGH (ref 70–99)

## 2023-12-16 SURGERY — RENAL ANGIOGRAPHY
Anesthesia: LOCAL | Laterality: Right

## 2023-12-16 MED ORDER — HEPARIN (PORCINE) IN NACL 1000-0.9 UT/500ML-% IV SOLN
INTRAVENOUS | Status: DC | PRN
  Administered 2023-12-16 (×2): 500 mL

## 2023-12-16 MED ORDER — ASPIRIN 81 MG PO CHEW: 81.0000 mg | CHEWABLE_TABLET | ORAL | Status: DC

## 2023-12-16 MED ORDER — SODIUM CHLORIDE 0.9 % IV SOLN: 250.0000 mL | INTRAVENOUS | Status: DC | PRN

## 2023-12-16 MED ORDER — FENTANYL CITRATE (PF) 100 MCG/2ML IJ SOLN
INTRAMUSCULAR | Status: DC | PRN
  Administered 2023-12-16: 25 ug via INTRAVENOUS

## 2023-12-16 MED ORDER — ONDANSETRON HCL 4 MG/2ML IJ SOLN: 4.0000 mg | Freq: Four times a day (QID) | INTRAMUSCULAR | Status: DC | PRN

## 2023-12-16 MED ORDER — SODIUM CHLORIDE 0.9% FLUSH: 3.0000 mL | Freq: Two times a day (BID) | INTRAVENOUS | Status: DC

## 2023-12-16 MED ORDER — HYDRALAZINE HCL 20 MG/ML IJ SOLN
INTRAMUSCULAR | Status: AC
  Filled 2023-12-16: qty 1

## 2023-12-16 MED ORDER — ACETAMINOPHEN 325 MG PO TABS: 650.0000 mg | ORAL_TABLET | ORAL | Status: DC | PRN

## 2023-12-16 MED ORDER — HEPARIN SODIUM (PORCINE) 1000 UNIT/ML IJ SOLN
INTRAMUSCULAR | Status: DC | PRN
  Administered 2023-12-16: 7000 [IU] via INTRAVENOUS

## 2023-12-16 MED ORDER — FENTANYL CITRATE (PF) 100 MCG/2ML IJ SOLN
INTRAMUSCULAR | Status: AC
  Filled 2023-12-16: qty 2

## 2023-12-16 MED ORDER — SODIUM CHLORIDE 0.9% FLUSH: 3.0000 mL | INTRAVENOUS | Status: DC | PRN

## 2023-12-16 MED ORDER — HEPARIN (PORCINE) IN NACL 2-0.9 UNITS/ML
INTRAMUSCULAR | Status: DC | PRN
  Administered 2023-12-16: 10 mL via INTRA_ARTERIAL

## 2023-12-16 MED ORDER — VERAPAMIL HCL 2.5 MG/ML IV SOLN
INTRAVENOUS | Status: AC
  Filled 2023-12-16: qty 2

## 2023-12-16 MED ORDER — MIDAZOLAM HCL 2 MG/2ML IJ SOLN
INTRAMUSCULAR | Status: AC
  Filled 2023-12-16: qty 2

## 2023-12-16 MED ORDER — SODIUM CHLORIDE 0.9 % IV SOLN: INTRAVENOUS | Status: DC

## 2023-12-16 MED ORDER — FREE WATER: 500.0000 mL | Freq: Once | Status: DC

## 2023-12-16 MED ORDER — HEPARIN SODIUM (PORCINE) 1000 UNIT/ML IJ SOLN
INTRAMUSCULAR | Status: AC
  Filled 2023-12-16: qty 10

## 2023-12-16 MED ORDER — HYDRALAZINE HCL 20 MG/ML IJ SOLN: 10.0000 mg | INTRAMUSCULAR | Status: DC | PRN

## 2023-12-16 MED ORDER — HYDRALAZINE HCL 20 MG/ML IJ SOLN
INTRAMUSCULAR | Status: DC | PRN
  Administered 2023-12-16: 10 mg via INTRAVENOUS

## 2023-12-16 MED ORDER — MIDAZOLAM HCL (PF) 2 MG/2ML IJ SOLN
INTRAMUSCULAR | Status: DC | PRN
  Administered 2023-12-16: 1 mg via INTRAVENOUS

## 2023-12-16 SURGICAL SUPPLY — 19 items
BAG SNAP BAND KOVER 36X36 (MISCELLANEOUS) IMPLANT
BALLOON STERLING OTW 5X20X135 (BALLOONS) IMPLANT
CATH LAUNCHER 6FR JR4 (CATHETERS) IMPLANT
COVER DOME SNAP 22 D (MISCELLANEOUS) IMPLANT
DCB RANGER 6.0X40 135 (BALLOONS) IMPLANT
GLIDESHEATH SLEND SS 6F .021 (SHEATH) IMPLANT
KIT ENCORE 26 ADVANTAGE (KITS) IMPLANT
KIT ESSENTIALS PG (KITS) IMPLANT
KIT MICROPUNCTURE NIT STIFF (SHEATH) IMPLANT
KIT SYRINGE INJ CVI SPIKEX1 (MISCELLANEOUS) IMPLANT
SET ATX-X65L (MISCELLANEOUS) IMPLANT
SHEATH PINNACLE 5F 10CM (SHEATH) IMPLANT
SHEATH PINNACLE 6F 10CM (SHEATH) IMPLANT
SHEATH PROBE COVER 6X72 (BAG) IMPLANT
STENT HERCULINK RX 6.0X12X135 (Permanent Stent) IMPLANT
TRAY PV CATH (CUSTOM PROCEDURE TRAY) ×2 IMPLANT
TUBING CIL FLEX 10 FLL-RA (TUBING) IMPLANT
WIRE HI TORQ VERSACORE-J 145CM (WIRE) IMPLANT
WIRE SPARTACORE .014X300CM (WIRE) IMPLANT

## 2023-12-16 NOTE — Discharge Instructions (Signed)

## 2023-12-16 NOTE — Interval H&P Note (Signed)
 History and Physical Interval Note:  12/16/2023 8:13 AM  Leah Ferrell  has presented today for surgery, with the diagnosis of renal stenosis.  The various methods of treatment have been discussed with the patient and family. After consideration of risks, benefits and other options for treatment, the patient has consented to  Procedure(s): RENAL ANGIOGRAPHY (N/A) as a surgical intervention.  The patient's history has been reviewed, patient examined, no change in status, stable for surgery.  I have reviewed the patient's chart and labs.  Questions were answered to the patient's satisfaction.     Theodor Mustin

## 2023-12-16 NOTE — Progress Notes (Signed)
 Patient and daughter was given discharge instructions. Both verbalized understanding.

## 2023-12-18 MED FILL — Lidocaine HCl Local Preservative Free (PF) Inj 1%: INTRAMUSCULAR | Qty: 30 | Status: AC

## 2023-12-21 ENCOUNTER — Other Ambulatory Visit: Payer: Self-pay | Admitting: *Deleted

## 2023-12-21 DIAGNOSIS — E1169 Type 2 diabetes mellitus with other specified complication: Secondary | ICD-10-CM

## 2023-12-24 ENCOUNTER — Encounter (HOSPITAL_COMMUNITY)

## 2023-12-28 ENCOUNTER — Ambulatory Visit: Payer: Medicare Other

## 2023-12-28 ENCOUNTER — Other Ambulatory Visit: Payer: Self-pay | Admitting: *Deleted

## 2023-12-28 VITALS — BP 161/91 | HR 57 | Ht 62.0 in | Wt 189.0 lb

## 2023-12-28 DIAGNOSIS — Z0000 Encounter for general adult medical examination without abnormal findings: Secondary | ICD-10-CM

## 2023-12-28 NOTE — Progress Notes (Signed)
 Subjective:   Leah Ferrell is a 73 y.o. female who presents for a Medicare Annual Wellness Visit.   I connected with  [name] on 12/28/23 by a audio enabled telemedicine application and verified that I am speaking with the correct person using two identifiers.  Patient Location: Home  Provider Location: Office/Clinic  Persons Participating in Visit: Patient.  I discussed the limitations of evaluation and management by telemedicine. The patient expressed understanding and agreed to proceed.   Vital Signs: Because this visit was a virtual/telehealth visit, some criteria may be missing or patient reported. Any vitals not documented were not able to be obtained and vitals that have been documented are patient reported.   If you're able to add these things as option in the [employer], we won't need it ... but for those who refuse to use the template, I guess it does need to be updated     Allergies (verified) Tape   History: Past Medical History:  Diagnosis Date   CAD (coronary artery disease)    a. 04/2012 NSTEMI: s/p DES to LAD.   Chronic diastolic CHF (congestive heart failure) (HCC)    Diabetes mellitus without complication (HCC)    Dysrhythmia    Environmental and seasonal allergies    Hyperlipidemia    Hypertension    Leukocytosis    Morbid obesity (HCC)    NSTEMI (non-ST elevated myocardial infarction) (HCC) 04/27/2012   PONV (postoperative nausea and vomiting)    PVD (peripheral vascular disease)    a. 04/2012 ABI: R 0.49, L 0.39. Angiography 06/14: Significant ostial left common iliac artery stenosis extending into the distal aorta, severe left common femoral artery stenosis, bilateral SFA occlusion with heavy calcifications. Functionally, one-vessel runoff bilaterally below the knee   Renal artery stenosis    s/p angioplasty/stenting bilateral renal arteries 03/16/19   Shortness of breath    Past Surgical History:  Procedure Laterality  Date   ABDOMINAL AORTAGRAM N/A 07/21/2012   Procedure: ABDOMINAL EZELLA;  Surgeon: Deatrice DELENA Cage, MD;  Location: [facility] CATH [facility];  Service: Cardiovascular;  Laterality: N/A;   ABDOMINAL AORTOGRAM W/LOWER EXTREMITY N/A 05/09/2020   Procedure: ABDOMINAL AORTOGRAM W/LOWER EXTREMITY;  Surgeon: Cage Deatrice DELENA, MD;  Location: [facility] INVASIVE CV [facility];  Service: Cardiovascular;  Laterality: N/A;   CARDIAC CATHETERIZATION     stents X 2   CATARACT EXTRACTION W/PHACO Left 09/08/2022   Procedure: CATARACT EXTRACTION PHACO AND INTRAOCULAR LENS PLACEMENT (IOC) with placement of Corticosteroid;  Surgeon: Harrie Agent, MD;  Location: [facility];  Service: Ophthalmology;  Laterality: Left;  CDE 8.48   CORONARY ANGIOGRAM  04/27/2012   Procedure: CORONARY ANGIOGRAM;  Surgeon: Aleene JINNY Passe, MD;  Location: [facility] CATH LAB;  Service: Cardiovascular;;   CORONARY ARTERY BYPASS GRAFT N/A 06/25/2020   Procedure: CORONARY ARTERY BYPASS GRAFTING (CABG)X3. LEFT INTERNAL MAMMARY ARTERY. RIGHT ENDOSCOPIC SAPHENOUS VEIN HARVESTING.;  Surgeon: German Bartlett PEDLAR, MD;  Location: [facility] OR;  Service: Open Heart Surgery;  Laterality: N/A;   CORONARY STENT INTERVENTION N/A 04/08/2023   Procedure: CORONARY STENT INTERVENTION;  Surgeon: Cage Deatrice DELENA, MD;  [facility]: [facility];  Service: Cardiovascular;  Laterality: N/A;  RCA   ENDARTERECTOMY FEMORAL Left 11/02/2013   Procedure: RESECTION LEFT COMMON FEMORAL ARTERY AND INSERTION OF INTERPOSITION 7mm HEMASHIELD GRAFT FROM LEFT EXTERNAL  ILIAC ARTERY TO LEFT PROFUNDA  FEMORIS ARTERY;  Surgeon: Agent JONETTA Collum, MD;  Location: [facility] OR;  Service: Vascular;  Laterality: Left;   EYE SURGERY  Right 2017   FRACTURE SURGERY Right Jul 07, 2014   Right Foot-  Pt[place]  at Home  by Heat duct   ILIAC ARTERY STENT Left 08/31/2013   LAD stent     LEFT HEART CATH AND CORONARY ANGIOGRAPHY N/A 05/09/2020   Procedure: LEFT HEART CATH AND CORONARY ANGIOGRAPHY;  Surgeon: Darron Deatrice LABOR, MD;  [facility]  INV[facility];  Service: Cardiovascular;  Laterality: N/A;   LEFT HEART CATH AND CORS/GRAFTS ANGIOGRAPHY N/A 04/08/2023   Procedure: LEFT HEART CATH AND CORS/GRAFTS ANGIOGRAPHY;  Surgeon: Darron Deatrice LABOR, MD;  [facility]: [facility];  Service: Cardiovascular;  Laterality: N/A;   LEFT HEART CATHETERIZATION WITH CORONARY ANGIOGRAM N/A 04/23/2012   Procedure: LEFT HEART CATHETERIZATION WITH CORONARY ANGIOGRAM;  Surgeon: Peter M Jordan, MD;  Location: [facility] CATH LAB;  Service: Cardiovascular;  Laterality: N/A;   LOWER EXTREMITY ANGIOGRAM Left 08/31/2013   Procedure: LOWER EXTREMITY ANGIOGRAM;  Surgeon: Deatrice LABOR Darron, MD;  [facility]: [facility];  Service: Cardiovascular;  Laterality: Left;   PERCUTANEOUS STENT INTERVENTION Left 08/31/2013   Procedure: PERCUTANEOUS STENT INTERVENTION;  Surgeon: Deatrice LABOR Darron, MD;  [facility]: [facility] CATH [facility];  Service: Cardiovascular;  Laterality: Left;  Common Iliac artery   PERIPHERAL VASCULAR CATHETERIZATION N/A 03/19/2016   Procedure: Abdominal Aortogram w/Lower Extremity;  Surgeon: Deatrice LABOR Darron, MD;  [facility]: [facility];  Service: Cardiovascular;  Laterality: N/A;   PERIPHERAL VASCULAR CATHETERIZATION  03/19/2016   Procedure: Peripheral Vascular Balloon Angioplasty-CFA Left;  Surgeon: Deatrice LABOR Darron, MD;  [facility];  Service: Cardiovascular;;   PERIPHERAL VASCULAR INTERVENTION Bilateral 03/16/2019   Procedure: PERIPHERAL VASCULAR INTERVENTION;  Surgeon: Darron Deatrice LABOR, MD;  [facility];  Service: Cardiovascular;  Laterality: Bilateral;  RENAL   PERIPHERAL VASCULAR INTERVENTION Bilateral 05/09/2020   Procedure: PERIPHERAL VASCULAR INTERVENTION;  Surgeon: Darron Deatrice LABOR, MD;  [facility];  Service: Cardiovascular;  Laterality: Bilateral;  Iliac   RENAL ANGIOGRAPHY  03/16/2019   RENAL ANGIOGRAPHY Bilateral 03/16/2019   Procedure: RENAL ANGIOGRAPHY;  Surgeon: Darron Deatrice LABOR, MD;  [facility]  INV[facility];  Service: Cardiovascular;  Laterality: Bilateral;   RENAL ANGIOGRAPHY Bilateral 04/08/2023   Procedure: RENAL ANGIOGRAPHY;  Surgeon: Darron Deatrice LABOR, MD;  [facility];  Service: Cardiovascular;  Laterality: Bilateral;   RENAL ANGIOGRAPHY N/A 12/16/2023   Procedure: RENAL ANGIOGRAPHY;  Surgeon: Darron Deatrice LABOR, MD;  [facility];  Service: Cardiovascular;  Laterality: N/A;   RENAL INTERVENTION Right 12/16/2023   Procedure: RENAL INTERVENTION;  Surgeon: Darron Deatrice LABOR, MD;  [facility];  Service: Cardiovascular;  Laterality: Right;   TEE WITHOUT CARDIOVERSION N/A 06/25/2020   Procedure: TRANSESOPHAGEAL ECHOCARDIOGRAM (TEE);  Surgeon: German Bartlett PEDLAR, MD;  Location: [facility] OR;  Service: Open Heart Surgery;  Laterality: N/A;   TUMOR REMOVAL     tumor removed from [place]   Family History  Problem Relation Age of Onset   Diabetes Mother    Hyperlipidemia Mother    Hypertension Mother    Peripheral vascular disease Mother    Other Sister        drowned   Other Sister        drowned   Diabetes Daughter        borderline   Hypertension Daughter    Diabetes Maternal Grandmother    Heart attack Maternal Grandfather    Heart disease Brother    Diabetes  Brother    Alcohol  abuse Brother    Diabetes Son    Hypertension Son    Hyperlipidemia Son    Breast cancer Neg Hx    Social History   Occupational History   Occupation: retired  Tobacco Use   Smoking status: Former    Current packs/day: 0.00    Average packs/day: 1 pack/day for 36.8 years (36.8 ttl pk-yrs)    Types: Cigarettes    Start date: 04/21/1964    Quit date: 02/17/2001    Years since quitting: 22.8   Smokeless tobacco: Never  Vaping Use   Vaping status: Never Used  Substance and Sexual Activity   Alcohol  use: No    Alcohol /week: 0.0 standard drinks of alcohol    Drug use: No   Sexual activity: Not Currently    Birth control/protection: Post-menopausal    Tobacco Counseling Counseling given: Yes  SDOH Screenings   Food Insecurity: No Food Insecurity (12/28/2023)  Housing: Low Risk  (12/28/2023)  Transportation Needs: No Transportation Needs (12/28/2023)  Utilities: Not At Risk (12/28/2023)  Alcohol  Screen: Low Risk  (12/25/2022)  Depression (PHQ2-9): Low Risk  (12/28/2023)  Financial Resource Strain: Medium Risk (08/17/2023)  Physical Activity: Insufficiently Active (12/28/2023)  Social Connections: Socially Isolated (12/28/2023)  Stress: No Stress Concern Present (12/28/2023)  Tobacco Use: Medium Risk (12/28/2023)  Health Literacy: Adequate Health Literacy (12/28/2023)   Depression Screen    12/28/2023    1:32 PM 11/12/2023   11:23 AM 11/09/2023    9:32 AM 08/20/2023    1:40 PM 08/11/2023   11:23 AM 06/15/2023    1:45 PM 06/02/2023    3:02 PM  PHQ 2/9 Scores  PHQ - 2 Score 0 0 0 0 0 0 0  PHQ- 9 Score 0 0    0        Data saved with a previous flowsheet row definition     Goals Addressed             This Visit's Progress    DIET - INCREASE WATER  INTAKE   On track    Try to drink 6-8 glasses of water  daily.       Visit info / Clinical Intake: Medicare Wellness Visit Type:: Subsequent Annual Wellness Visit Persons participating in visit:: patient Medicare Wellness Visit Mode:: Telephone If telephone:: video declined Because this visit was a virtual/telehealth visit:: vitals recorded from last visit If Telephone or Video please confirm:: I connected with the patient using audio enabled telemedicine application and verified that I am speaking with the correct person using two identifiers Patient Location:: home Provider Location:: office Information given by:: patient Interpreter Needed?: No Pre-visit prep was completed: yes AWV questionnaire completed by patient prior to visit?: no Living arrangements:: (!) lives alone Patient's Overall Health Status Rating: very good Typical amount of pain: none Does pain affect  daily life?: no Are you currently prescribed opioids?: no  Dietary Habits and Nutritional Risks How many meals a day?: 3 Eats fruit and vegetables daily?: (!) no Most meals are obtained by: preparing own meals In the last 2 weeks, have you had any of the following?: none Diabetic:: (!) yes Any non-healing wounds?: no How often do you check your BS?: 3 Would you like to be referred to a Nutritionist or for Diabetic Management? : no  Functional Status Activities of Daily Living (to include ambulation/medication): Independent Ambulation: Independent Medication Administration: Independent Home Management: Independent Manage your own finances?: yes Primary transportation is: driving Concerns about hearing?: no  Fall Screening Falls in the past year?: 0 Number of falls in past year: 0 Was there an injury with Fall?: 0 Fall Risk Category Calculator: 0 Patient Fall Risk Level: Low Fall Risk  Fall Risk Patient at Risk for Falls Due to: No Fall Risks Fall risk Follow up: Falls evaluation completed; Education provided  Home and Transportation Safety: All rugs have non-skid backing?: yes All stairs or steps have railings?: yes Grab bars in the bathtub or shower?: yes Have non-skid surface in bathtub or shower?: yes Good home lighting?: yes Regular seat belt use?: yes Hospital stays in the last year:: (!) yes How many hospital stays:: other (not sure) Reason: heart attack per pt  Cognitive Assessment Difficulty concentrating, remembering, or making decisions? : no Will 6CIT or Mini Cog be Completed: yes What year is it?: 0 points What month is it?: 0 points Give patient an address phrase to remember (5 components): [address] [place], [place] About what time is it?: 0 points Count backwards from 20 to 1: 0 points Say the months of the year in reverse: 0 points Repeat the address phrase from earlier: 4 points 6 CIT Score: 4 points  Advance Directives (For Healthcare) Does Patient  Have a Medical Advance Directive?: No (pt aware to pick up infor at office) Would patient like information on creating a medical advance directive?: No - Patient declined  Reviewed/Updated  Reviewed/Updated: Reviewed All (Medical, Surgical, Family, Medications, Allergies, Care Teams, Patient Goals)        Objective:    Today's Vitals   12/28/23 1317  BP: (!) 161/91  Pulse: (!) 57  Weight: 189 lb (85.7 kg)  Height: 5' 2 (1.575 m)   Body mass index is 34.57 kg/m.  Current Medications (verified) Outpatient Encounter Medications as of 12/28/2023  Medication Sig   acetaminophen  (TYLENOL ) 325 MG tablet Take 2 tablets (650 mg total) by mouth every 6 (six) hours as needed for mild pain (pain score 1-3) (or Fever >/= 101).   albuterol  (VENTOLIN  HFA) 108 (90 Base) MCG/ACT inhaler Inhale 2 puffs into the lungs every 6 (six) hours as needed for shortness of breath or wheezing.   Ascorbic Acid  (VITAMIN C) 1000 MG tablet Take 1,000 mg by mouth 3 (three) times daily.    aspirin  EC 81 MG tablet Take 1 tablet (81 mg total) by mouth daily with breakfast. In the morning   BD ULTRA-FINE PEN NEEDLES 29G X 12.7MM MISC USE PENNEEDLES 3 TIMES  DAILY AS DIRECTED   blood glucose meter kit and supplies Dispense based on patient and insurance preference. Use up to four times daily as directed. (FOR ICD-10 E10.9, E11.9).   Calcium  Carbonate-Vit D-Min (CALTRATE 600+D PLUS MINERALS) 600-800 MG-UNIT TABS Take 1 tablet by mouth 3 (three) times daily.    carvedilol  (COREG ) 25 MG tablet Take 1 tablet (25 mg total) by mouth 2 (two) times daily with a meal.   Cholecalciferol  (VITAMIN D -3) 125 MCG (5000 UT) TABS Take 5,000 Units by mouth daily.   clopidogrel  (PLAVIX ) 75 MG tablet Take 1 tablet (75 mg total) by mouth daily.   fish oil-omega-3 fatty acids  1000 MG capsule Take 1 g by mouth daily.    furosemide  (LASIX ) 40 MG tablet Take 1 tablet (40 mg total) by mouth daily.   glucose blood (GLUCOSE METER TEST) test  strip Use 6-10 times a day, Uses [employer]   hydroxypropyl methylcellulose (ISOPTO TEARS) 2.5 % ophthalmic solution Place 1 drop into both eyes 3 (three) times daily  as needed (for dry eyes).   insulin  degludec (TRESIBA  FLEXTOUCH) 100 UNIT/ML FlexTouch Pen Inject 20 Units into the skin at bedtime.   insulin  lispro (HUMALOG  KWIKPEN) 200 UNIT/ML KwikPen Inject 15-30 Units into the skin 3 (three) times daily before meals.   Insulin  Syringe 27G X 1/2 0.5 ML MISC Use with Fiasp  insulin  TID   isosorbide  mononitrate (IMDUR ) 30 MG 24 hr tablet Take 1 tablet (30 mg total) by mouth daily.   losartan  (COZAAR ) 100 MG tablet Take 1 tablet (100 mg total) by mouth daily.   montelukast  (SINGULAIR ) 10 MG tablet Take 1 tablet by mouth once daily with breakfast   nitroGLYCERIN  (NITROSTAT ) 0.4 MG SL tablet Place 1 tablet (0.4 mg total) under the tongue every 5 (five) minutes as needed for chest pain.   oxybutynin  (DITROPAN -XL) 5 MG 24 hr tablet Take 1 tablet (5 mg total) by mouth at bedtime.   polyethylene glycol (MIRALAX  / GLYCOLAX ) 17 g packet Take 17 g by mouth daily as needed.   potassium chloride  SA (KLOR-CON  M) 20 MEQ tablet Take 1 tablet (20 mEq total) by mouth daily.   RELION PEN NEEDLES 29G X MISC USE 1 NEEDLE TO INJECT INSULIN  SUBCUTANEOUSLY 3 TIMES DAILY   rosuvastatin  (CRESTOR ) 40 MG tablet Take 1 tablet by mouth once daily   Semaglutide , 2 MG/DOSE, (OZEMPIC , 2 MG/DOSE,) 8 MG/3ML SOPN Inject 2 mg into the skin once a week.   No facility-administered encounter medications on file as of 12/28/2023.   Hearing/Vision screen Hearing Screening - Comments:: Pt denies hearing dif Vision Screening - Comments:: [facility] last ov 2025/ pt only use glasses for fine print Immunizations and Health Maintenance Health Maintenance  Topic Date Due   COVID-19 Vaccine (1 - 2025-26 season) Never done   FOOT EXAM  12/15/2023   Zoster Vaccines- Shingrix (1 of 2) 02/11/2024 (Originally 04/21/2001)   Influenza  Vaccine  05/17/2024 (Originally 09/18/2023)   DEXA SCAN  01/15/2024   OPHTHALMOLOGY EXAM  04/01/2024   HEMOGLOBIN A1C  05/11/2024   Mammogram  07/14/2024   Diabetic kidney evaluation - Urine ACR  08/10/2024   Diabetic kidney evaluation - eGFR measurement  12/10/2024   Medicare Annual Wellness (AWV)  12/27/2024   Fecal DNA (Cologuard)  04/23/2025   DTaP/Tdap/Td (3 - Td or Tdap) 10/15/2031   Pneumococcal Vaccine: 50+ Years  Completed   Hepatitis C Screening  Completed   Meningococcal B Vaccine  Aged Out   Hepatitis B Vaccines 19-59 Average Risk  Discontinued        Assessment/Plan:  This is a routine wellness examination for Ruta.  Patient Care Team: Lavell Bari LABOR, FNP as PCP - General (Nurse Practitioner) Darron Deatrice LABOR, MD as PCP - Cardiology (Cardiology) Darron Deatrice LABOR, MD as Consulting Physician (Cardiology) Billee Mliss BIRCH, [facility] (Pharmacist) [facility], [place]  I have personally reviewed and noted the following in the patient's chart:   Medical and social history Use of alcohol , tobacco or illicit drugs  Current medications and supplements including opioid prescriptions. Functional ability and status Nutritional status Physical activity Advanced directives List of other physicians Hospitalizations, surgeries, and ER visits in previous 12 months Vitals Screenings to include cognitive, depression, and falls Referrals and appointments  No orders of the defined types were placed in this encounter.  In addition, I have reviewed and discussed with patient certain preventive protocols, quality metrics, and best practice recommendations. A written personalized care plan for preventive services as well as general preventive  health recommendations were provided to patient.   Ozie Ned, CMA   12/28/2023   Return in 1 year (on 12/27/2024).  After Visit Summary: (MyChart) Due to this being a telephonic visit, the after visit summary with patients personalized  plan was offered to patient via MyChart   Nurse Notes: pt is aware and due for foot exam and covid

## 2023-12-31 LAB — OPHTHALMOLOGY REPORT-SCANNED

## 2024-01-05 ENCOUNTER — Ambulatory Visit (HOSPITAL_COMMUNITY)
Admission: RE | Admit: 2024-01-05 | Discharge: 2024-01-05 | Disposition: A | Discharge status: Home / Self Care | Source: Ambulatory Visit | Attending: Cardiovascular Disease | Admitting: Cardiovascular Disease

## 2024-01-05 DIAGNOSIS — I150 Renovascular hypertension: Secondary | ICD-10-CM | POA: Insufficient documentation

## 2024-01-08 ENCOUNTER — Ambulatory Visit: Payer: Self-pay | Admitting: Cardiovascular Disease

## 2024-01-08 DIAGNOSIS — I150 Renovascular hypertension: Secondary | ICD-10-CM

## 2024-01-12 ENCOUNTER — Encounter: Payer: Self-pay | Admitting: Cardiovascular Disease

## 2024-01-12 ENCOUNTER — Ambulatory Visit: Attending: Cardiovascular Disease | Admitting: Cardiovascular Disease

## 2024-01-12 VITALS — BP 138/50 | HR 66 | Resp 14 | Ht 62.5 in | Wt 192.6 lb

## 2024-01-12 DIAGNOSIS — I25118 Atherosclerotic heart disease of native coronary artery with other forms of angina pectoris: Secondary | ICD-10-CM

## 2024-01-12 DIAGNOSIS — I5032 Chronic diastolic (congestive) heart failure: Secondary | ICD-10-CM

## 2024-01-12 DIAGNOSIS — I739 Peripheral vascular disease, unspecified: Secondary | ICD-10-CM | POA: Diagnosis not present

## 2024-01-12 DIAGNOSIS — E785 Hyperlipidemia, unspecified: Secondary | ICD-10-CM | POA: Diagnosis not present

## 2024-01-12 DIAGNOSIS — I150 Renovascular hypertension: Secondary | ICD-10-CM

## 2024-01-12 NOTE — Telephone Encounter (Signed)
 Update.  Cannot be done in [place] or at [facility].  Patient will have done in [place] at [facility].

## 2024-01-12 NOTE — Progress Notes (Signed)
 Cardiology Office Note   Date:  01/12/2024   ID:  [name], [name] 1950-07-03, MRN [id number]  PCP:  Lavell Bari LABOR, FNP  Cardiologist: Dr. Darron   No chief complaint on file.    History of Present Illness: Leah Ferrell is a 73 y.o. female who presents for a followup visit regarding peripheral arterial disease and coronary artery disease.  She has known history of coronary artery disease status post drug-eluting stent placement to the LAD X 2 with subsequent CABG in May 2022. She is known to have peripheral arterial disease status post left common iliac artery stent placement followed by left common femoral artery resection and bypass from left external iliac to the left profunda done by Dr. Gerlean in 2015. She is known to have chronically occluded bilateral SFAs being managed medically.  She had worsening left leg claudication in 2018 with elevated velocity at the proximal anastomosis of the graft.   Angiography in March 2018 showed mild to moderate calcified common iliac artery disease with patent stent in the left common iliac artery. There was severe stenosis in the left common femoral artery at the proximal anastomosis of the bypass to the profunda. I performed successful angioplasty and drug-coated balloon angioplasty.   She is status post bilateral renal artery stenting in January 2021 due to refractory hypertension and recurrent heart failure.  She had significant improvement in blood pressure control as well as heart failure symptoms since then.    Lower extremity angiography in March of 2022 showed patent renal artery stents with significant restenosis on the right side, severe bilateral heavily calcified ostial common iliac artery disease extending into the distal aorta.  The iliac arteries were treated by successful kissing stent placement.  On the right side, there was moderate common femoral artery stenosis as well as chronically occluded SFA with reconstitution  distally via collaterals from the profunda, short occlusion of above-the-knee popliteal artery with collaterals and three-vessel runoff below the knee.  On the left side, there was flush occlusion of the SFA with collaterals from the profunda and two-vessel runoff below the knee. She underwent CABG in May, 2022 with LIMA to LAD, SVG to OM and SVG to diagonal.  Most recent ABI was done in October 2024 and was moderately to severely reduced bilaterally at 0.49.  She was hospitalized in February of 2025 with non-STEMI in the setting of severely elevated blood pressure.  Echocardiogram showed an EF of 50 to 55%. Cardiac catheterization was done via the left radial artery and showed heavily calcified coronary arteries with severe underlying three-vessel coronary artery disease with patent grafts including LIMA to LAD, SVG to diagonal and SVG to OM 3.  There was severe progression of native coronary artery disease including the right coronary artery which was not bypassed.  I performed difficult but successful PCI and 3 overlapped drug-eluting stent placement to the right coronary artery.  Renal angiography showed widely patent left renal artery stent with no significant restenosis.  Right renal artery stent was patent but with severe in-stent restenosis proximally.  She was hospitalized at [facility] in April with acute on chronic diastolic heart failure.  She was diuresed and improved.  Subsequently, she developed hypotension.  We did labs which showed evidence of volume depletion with hyperkalemia.  Lisinopril  and spironolactone  were stopped.  She attended vascular rehab with significant improvement in claudication symptoms.   She was hospitalized in September with acute on chronic diastolic heart failure in setting  of elevated blood pressure.  Her troponin was mildly elevated.  Echocardiogram showed normal LV systolic function with mild mitral stenosis.  Due to recurrent heart failure and elevated  blood pressure, I proceeded with renal artery angiography last month which confirmed significant right renal artery in-stent restenosis with no significant stenosis affecting the left renal artery.  I performed successful drug-coated balloon angioplasty to the right renal artery stent but there was residual ostial stenosis that was treated with another overlapped stent.  She has been doing well and reports improvement in blood pressure control.  She has stable substernal chest tightness that happens only with overexertion.  Lower extremity claudication significantly improved with supervised exercise therapy program.  Past Medical History:  Diagnosis Date   CAD (coronary artery disease)    a. 04/2012 NSTEMI: s/p DES to LAD.   Chronic diastolic CHF (congestive heart failure) (HCC)    Diabetes mellitus without complication (HCC)    Dysrhythmia    Environmental and seasonal allergies    Hyperlipidemia    Hypertension    Leukocytosis    Morbid obesity (HCC)    NSTEMI (non-ST elevated myocardial infarction) (HCC) 04/27/2012   PONV (postoperative nausea and vomiting)    PVD (peripheral vascular disease)    a. 04/2012 ABI: R 0.49, L 0.39. Angiography 06/14: Significant ostial left common iliac artery stenosis extending into the distal aorta, severe left common femoral artery stenosis, bilateral SFA occlusion with heavy calcifications. Functionally, one-vessel runoff bilaterally below the knee   Renal artery stenosis    s/p angioplasty/stenting bilateral renal arteries 03/16/19   Shortness of breath     Past Surgical History:  Procedure Laterality Date   ABDOMINAL AORTAGRAM N/A 07/21/2012   Procedure: ABDOMINAL EZELLA;  Surgeon: Deatrice DELENA Cage, MD;  Location: [facility] CATH LAB;  Service: Cardiovascular;  Laterality: N/A;   ABDOMINAL AORTOGRAM W/LOWER EXTREMITY N/A 05/09/2020   Procedure: ABDOMINAL AORTOGRAM W/LOWER EXTREMITY;  Surgeon: Cage Deatrice DELENA, MD;  Location: [facility] INVASIVE CV LAB;  Service:  Cardiovascular;  Laterality: N/A;   CARDIAC CATHETERIZATION     stents X 2   CATARACT EXTRACTION W/PHACO Left 09/08/2022   Procedure: CATARACT EXTRACTION PHACO AND INTRAOCULAR LENS PLACEMENT (IOC) with placement of Corticosteroid;  Surgeon: Harrie Agent, MD;  Location: [facility] ORS;  Service: Ophthalmology;  Laterality: Left;  CDE 8.48   CORONARY ANGIOGRAM  04/27/2012   Procedure: CORONARY ANGIOGRAM;  Surgeon: Aleene JINNY Passe, MD;  Location: [facility] CATH LAB;  Service: Cardiovascular;;   CORONARY ARTERY BYPASS GRAFT N/A 06/25/2020   Procedure: CORONARY ARTERY BYPASS GRAFTING (CABG)X3. LEFT INTERNAL MAMMARY ARTERY. RIGHT ENDOSCOPIC SAPHENOUS VEIN HARVESTING.;  Surgeon: German Bartlett PEDLAR, MD;  Location: [facility] OR;  Service: Open Heart Surgery;  Laterality: N/A;   CORONARY STENT INTERVENTION N/A 04/08/2023   Procedure: CORONARY STENT INTERVENTION;  Surgeon: Cage Deatrice DELENA, MD;  [facility]: [facility] INV[facility] CV LAB;  Service: Cardiovascular;  Laterality: N/A;  RCA   ENDARTERECTOMY FEMORAL Left 11/02/2013   Procedure: RESECTION LEFT COMMON FEMORAL ARTERY AND INSERTION OF INTERPOSITION 7mm HEMASHIELD GRAFT FROM LEFT EXTERNAL  ILIAC ARTERY TO LEFT PROFUNDA  FEMORIS ARTERY;  Surgeon: Agent JONETTA Collum, MD;  Location: [facility] OR;  Service: Vascular;  Laterality: Left;   EYE SURGERY Right 2017   FRACTURE SURGERY Right Jul 07, 2014   Right Foot-  [place]  [facility]  by Heat duct   ILIAC ARTERY STENT Left 08/31/2013   LAD stent     LEFT HEART CATH AND CORONARY ANGIOGRAPHY N/A 05/09/2020  Procedure: LEFT HEART CATH AND CORONARY ANGIOGRAPHY;  Surgeon: Darron Deatrice LABOR, MD;  [facility];  Service: Cardiovascular;  Laterality: N/A;   LEFT HEART CATH AND CORS/GRAFTS ANGIOGRAPHY N/A 04/08/2023   Procedure: LEFT HEART CATH AND CORS/GRAFTS ANGIOGRAPHY;  Surgeon: Darron Deatrice LABOR, MD;  [facility] LAB;  Service: Cardiovascular;  Laterality: N/A;   LEFT HEART CATHETERIZATION WITH CORONARY ANGIOGRAM N/A 04/23/2012    Procedure: LEFT HEART CATHETERIZATION WITH CORONARY ANGIOGRAM;  Surgeon: Peter M Jordan, MD;  Location: [facility] LAB;  Service: Cardiovascular;  Laterality: N/A;   LOWER EXTREMITY ANGIOGRAM Left 08/31/2013   Procedure: LOWER EXTREMITY ANGIOGRAM;  Surgeon: Deatrice LABOR Darron, MD;  [facility]: [facility];  Service: Cardiovascular;  Laterality: Left;   PERCUTANEOUS STENT INTERVENTION Left 08/31/2013   Procedure: PERCUTANEOUS STENT INTERVENTION;  Surgeon: Deatrice LABOR Darron, MD;  [facility]: [facility] C[facility];  Service: Cardiovascular;  Laterality: Left;  Common Iliac artery   PERIPHERAL VASCULAR CATHETERIZATION N/A 03/19/2016   Procedure: Abdominal Aortogram w/Lower Extremity;  Surgeon: Deatrice LABOR Darron, MD;  [facility]: [facility] LAB;  Service: Cardiovascular;  Laterality: N/A;   PERIPHERAL VASCULAR CATHETERIZATION  03/19/2016   Procedure: Peripheral Vascular Balloon Angioplasty-CFA Left;  Surgeon: Deatrice LABOR Darron, MD;  [facility];  Service: Cardiovascular;;   PERIPHERAL VASCULAR INTERVENTION Bilateral 03/16/2019   Procedure: PERIPHERAL VASCULAR INTERVENTION;  Surgeon: Darron Deatrice LABOR, MD;  [facility]: [facility];  Service: Cardiovascular;  Laterality: Bilateral;  RENAL   PERIPHERAL VASCULAR INTERVENTION Bilateral 05/09/2020   Procedure: PERIPHERAL VASCULAR INTERVENTION;  Surgeon: Darron Deatrice LABOR, MD;  [facility];  Service: Cardiovascular;  Laterality: Bilateral;  Iliac   RENAL ANGIOGRAPHY  03/16/2019   RENAL ANGIOGRAPHY Bilateral 03/16/2019   Procedure: RENAL ANGIOGRAPHY;  Surgeon: Darron Deatrice LABOR, MD;  [facility];  Service: Cardiovascular;  Laterality: Bilateral;   RENAL ANGIOGRAPHY Bilateral 04/08/2023   Procedure: RENAL ANGIOGRAPHY;  Surgeon: Darron Deatrice LABOR, MD;  [facility];  Service: Cardiovascular;  Laterality: Bilateral;   RENAL ANGIOGRAPHY N/A 12/16/2023   Procedure: RENAL ANGIOGRAPHY;  Surgeon: Darron Deatrice LABOR, MD;   [facility];  Service: Cardiovascular;  Laterality: N/A;   RENAL INTERVENTION Right 12/16/2023   Procedure: RENAL INTERVENTION;  Surgeon: Darron Deatrice LABOR, MD;  [facility];  Service: Cardiovascular;  Laterality: Right;   TEE WITHOUT CARDIOVERSION N/A 06/25/2020   Procedure: TRANSESOPHAGEAL ECHOCARDIOGRAM (TEE);  Surgeon: German Bartlett PEDLAR, MD;  Location: [facility] OR;  Service: Open Heart Surgery;  Laterality: N/A;   TUMOR REMOVAL     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     albuterol  (VENTOLIN  HFA) 108 (90 Base) MCG/ACT inhaler Inhale 2 puffs into the lungs every 6 (six) hours as needed for shortness of breath or wheezing. 8 g 2   Ascorbic Acid  (VITAMIN C) 1000 MG tablet Take 1,000 mg by mouth 3 (three) times daily.      aspirin  EC 81 MG tablet Take 1 tablet (81 mg total) by mouth daily with breakfast. In the morning 30 tablet 11   BD ULTRA-FINE PEN NEEDLES 29G X 12.7MM MISC USE PENNEEDLES 3 TIMES  DAILY AS DIRECTED 270 each 1  blood glucose meter kit and supplies Dispense based on patient and insurance preference. Use up to four times daily as directed. (FOR ICD-10 E10.9, E11.9). 1 each 0   Calcium  Carbonate-Vit D-Min (CALTRATE 600+D PLUS MINERALS) 600-800 MG-UNIT TABS Take 1 tablet by mouth 3 (three) times daily.      carvedilol  (COREG ) 25 MG tablet Take 1 tablet (25 mg total) by mouth 2 (two) times daily with a meal. 180 tablet 2   Cholecalciferol  (VITAMIN D -3) 125 MCG (5000 UT) TABS Take 5,000 Units by mouth daily.     clopidogrel  (PLAVIX ) 75 MG tablet Take 1 tablet (75 mg total) by mouth daily. 90 tablet 3   fish oil-omega-3 fatty acids  1000 MG capsule Take 1 g by mouth daily.      furosemide  (LASIX ) 40 MG tablet Take 1 tablet (40 mg total) by mouth  daily. 30 tablet 3   glucose blood (GLUCOSE METER TEST) test strip Use 6-10 times a day, Uses ReliOn 900 each 12   hydroxypropyl methylcellulose (ISOPTO TEARS) 2.5 % ophthalmic solution Place 1 drop into both eyes 3 (three) times daily as needed (for dry eyes).     insulin  degludec (TRESIBA  FLEXTOUCH) 100 UNIT/ML FlexTouch Pen Inject 20 Units into the skin at bedtime.     insulin  lispro (HUMALOG  KWIKPEN) 200 UNIT/ML KwikPen Inject 15-30 Units into the skin 3 (three) times daily before meals.     Insulin  Syringe 27G X 1/2 0.5 ML MISC Use with Fiasp  insulin  TID 100 each 3   isosorbide  mononitrate (IMDUR ) 30 MG 24 hr tablet Take 1 tablet (30 mg total) by mouth daily. 90 tablet 3   losartan  (COZAAR ) 100 MG tablet Take 1 tablet (100 mg total) by mouth daily. 30 tablet 0   montelukast  (SINGULAIR ) 10 MG tablet Take 1 tablet by mouth once daily with breakfast 90 tablet 1   nitroGLYCERIN  (NITROSTAT ) 0.4 MG SL tablet Place 1 tablet (0.4 mg total) under the tongue every 5 (five) minutes as needed for chest pain. 10 tablet 3   oxybutynin  (DITROPAN -XL) 5 MG 24 hr tablet TAKE 1 TABLET BY MOUTH AT BEDTIME 90 tablet 0   polyethylene glycol (MIRALAX  / GLYCOLAX ) 17 g packet Take 17 g by mouth daily as needed.     potassium chloride  SA (KLOR-CON  M) 20 MEQ tablet Take 1 tablet (20 mEq total) by mouth daily. 30 tablet 1   RELION PEN NEEDLES 29G X MISC USE 1 NEEDLE TO INJECT INSULIN  SUBCUTANEOUSLY 3 TIMES DAILY 50 each 5   rosuvastatin  (CRESTOR ) 40 MG tablet Take 1 tablet by mouth once daily 90 tablet 0   Semaglutide , 2 MG/DOSE, (OZEMPIC , 2 MG/DOSE,) 8 MG/3ML SOPN Inject 2 mg into the skin once a week. 3 mL 0   No current facility-administered medications for this visit.    Allergies:   Tape    Social History:  The patient  reports that she quit smoking about 22 years ago. Her smoking use included cigarettes. She started smoking about 59 years ago. She has a 36.8 pack-year smoking history. She has never used  smokeless tobacco. She reports that she does not drink alcohol  and does not use drugs.   Family History:  The patient's family history includes Alcohol  abuse in her brother; Diabetes in her brother, daughter, maternal grandmother, mother, and son; Heart attack in her maternal grandfather; Heart disease in her brother; Hyperlipidemia in her mother and son; Hypertension in her daughter, mother, and son; Other in her sister and sister; Peripheral  vascular disease in her mother.    ROS:  Please see the history of present illness.   Otherwise, review of systems are positive for none.   All other systems are reviewed and negative.    PHYSICAL EXAM: VS:  BP (!) 138/50   Pulse 66   Resp 14   Ht 5' 2.5 (1.588 m)   Wt 192 lb 9.6 oz (87.4 kg)   SpO2 97%   BMI 34.67 kg/m  , BMI Body mass index is 34.67 kg/m. GEN: Well nourished, well developed, in no acute distress  HEENT: normal  Neck: no JVD or masses.  Right carotid bruit. Cardiac: RRR; no  rubs, or gallops , trace edema . There is 2/6 systolic ejection murmur in the aortic area.  Respiratory:  clear to auscultation bilaterally, normal work of breathing GI: soft, nontender, nondistended, + BS MS: no deformity or atrophy  Skin: warm and dry, no rash Neuro:  Strength and sensation are intact Psych: euthymic mood, full affect Distal pulses are not palpable.  Radial pulses are normal bilaterally.   EKG:  EKG is not ordered today.    Recent Labs: 08/11/2023: TSH 1.310 11/06/2023: B Natriuretic Peptide 298.0 11/08/2023: Magnesium  1.7 11/12/2023: ALT 33 12/11/2023: BUN 14; Creatinine, Ser 0.90; Hemoglobin 13.6; Platelets 247; Potassium 4.5; Sodium 138    Lipid Panel    Component Value Date/Time   CHOL 141 08/11/2023 1207   TRIG 101 08/11/2023 1207   TRIG 135 09/16/2012 1322   HDL 62 08/11/2023 1207   HDL 60 09/16/2012 1322   CHOLHDL 2.3 08/11/2023 1207   CHOLHDL 2.5 04/08/2023 0226   VLDL 27 04/08/2023 0226   LDLCALC 61 08/11/2023  1207   LDLCALC 92 09/16/2012 1322      Wt Readings from Last 3 Encounters:  01/12/24 192 lb 9.6 oz (87.4 kg)  12/28/23 189 lb (85.7 kg)  12/16/23 189 lb (85.7 kg)           No data to display            ASSESSMENT AND PLAN:  1. Peripheral arterial disease: Status post  kissing stent placement to the common iliac arteries .  She has moderately reduced ABI at 0.49 bilaterally .  Severe claudication improved significantly with a supervised exercise therapy program.  She is now able to walk 30 minutes without having to stop.  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  Subsequent in-stent restenosis affecting the right renal artery stent with recent drug-coated balloon angioplasty and adding another ostial stent with good results.  Continue dual antiplatelet therapy.  Repeat renal artery duplex in 6 months.    3. Coronary artery disease involving native coronary arteries with stable angina: Status post successful PCI and drug-eluting stent placement to the native right coronary artery which was not bypassed before.  Continue long-term dual antiplatelet therapy.  Symptoms are consistent with class II angina with some improvement since the addition of Imdur  30 mg daily.  4. Hyperlipidemia: Continue high-dose rosuvastatin .  Recent lipid profile showed improvement in LDL which was down to 49.  Her LP(a) was severely elevated at 241 and we might have to address that at some point in the future.  6.  Chronic diastolic heart failure: She appears to be euvolemic on furosemide  40 mg once daily.  Blood pressure is reasonably controlled on Toprol  and losartan .    Disposition: Follow-up in 6 months.  Signed,  Deatrice Cage, MD  01/12/2024 8:19 AM  [facility]

## 2024-01-12 NOTE — Patient Instructions (Addendum)
 Medication Instructions:  Your physician recommends that you continue on your current medications as directed. Please refer to the Current Medication list given to you today.  *If you need a refill on your cardiac medications before your next appointment, please call your pharmacy*  Lab Work: none If you have labs (blood work) drawn today and your tests are completely normal, you will receive your results only by: MyChart Message (if you have MyChart) OR A paper copy in the mail If you have any lab test that is abnormal or we need to change your treatment, we will call you to review the results.  Testing/Procedures: Your physician has requested that you have a renal artery duplex in late May 2026 in [place] During this test, an ultrasound is used to evaluate blood flow to the kidneys. Allow one hour for this exam. Do not eat after midnight the day before and avoid carbonated beverages. Take your medications as you usually do.   Follow-Up: At [facility], you and your health needs are our priority.  As part of our continuing mission to provide you with exceptional heart care, our providers are all part of one team.  This team includes your primary Cardiologist (physician) and Advanced Practice Providers or APPs (Physician Assistants and Nurse Practitioners) who all work together to provide you with the care you need, when you need it.  Your next appointment:   6 month(s)  Provider:   Deatrice Cage, MD    We recommend signing up for the patient portal called MyChart.  Sign up information is provided on this After Visit Summary.  MyChart is used to connect with patients for Virtual Visits (Telemedicine).  Patients are able to view lab/test results, encounter notes, upcoming appointments, etc.  Non-urgent messages can be sent to your provider as well.   To learn more about what you can do with MyChart, go to [email or web address].   Other Instructions

## 2024-01-12 NOTE — Telephone Encounter (Signed)
 Patient would like to have done at [facility].  Order changed.

## 2024-02-01 ENCOUNTER — Other Ambulatory Visit: Payer: Self-pay

## 2024-02-02 NOTE — Telephone Encounter (Unsigned)
 Copied from CRM #[id number]. Topic: Clinical - Prescription Issue >> Feb 02, 2024 11:11 AM Wess RAMAN wrote: Reason for CRM: Patient states she would like to speak with Lavell Lye, FNP or her nurse about her furosemide  (LASIX ) 40 MG tablet. She is out and to get an emergency 3 day supply from the pharmacy and is out again.  Callback #: [id number]  Pharmacy: [facility] [address] [place] - [address] [place] [address] Phone: [phone] Fax: [phone] Hours: Not open 24 hours

## 2024-02-03 ENCOUNTER — Telehealth: Payer: Self-pay

## 2024-02-03 MED ORDER — FUROSEMIDE 40 MG PO TABS: 40.0000 mg | ORAL_TABLET | Freq: Every day | ORAL | 11 refills | Status: AC

## 2024-02-03 NOTE — Telephone Encounter (Signed)
 Looks like Darron Deatrice LABOR, MD sent this Rx in electronically, this morning, to [facility] in [place].

## 2024-02-03 NOTE — Telephone Encounter (Signed)
 Copied from CRM #[id number]. Topic: Clinical - Prescription Issue >> Feb 02, 2024 11:11 AM Wess RAMAN wrote: Reason for CRM: Patient states she would like to speak with Lavell Lye, FNP or her nurse about her furosemide  (LASIX ) 40 MG tablet. She is out and to get an emergency 3 day supply from the pharmacy and is out again.  Callback #: [id number]  Pharmacy: [facility] [address] [place] - [address] [place] [address] Phone: [phone] Fax: [phone] Hours: Not open 24 hours >> Feb 03, 2024 10:23 AM Marda G wrote: Patient calling back regarding her medication:  furosemide  (LASIX ) 40 MG tablet  Patient was  told medication was called in and that she may have to give it a little time. She said okay

## 2024-02-05 ENCOUNTER — Other Ambulatory Visit: Payer: Self-pay

## 2024-02-15 ENCOUNTER — Encounter: Payer: Self-pay | Admitting: Family

## 2024-02-15 ENCOUNTER — Ambulatory Visit: Payer: Self-pay | Admitting: Family

## 2024-02-15 VITALS — BP 170/68 | HR 56 | Temp 97.4°F | Ht 62.5 in | Wt 191.8 lb

## 2024-02-15 DIAGNOSIS — E66811 Obesity, class 1: Secondary | ICD-10-CM

## 2024-02-15 DIAGNOSIS — E1151 Type 2 diabetes mellitus with diabetic peripheral angiopathy without gangrene: Secondary | ICD-10-CM | POA: Diagnosis not present

## 2024-02-15 DIAGNOSIS — Z951 Presence of aortocoronary bypass graft: Secondary | ICD-10-CM

## 2024-02-15 DIAGNOSIS — I25708 Atherosclerosis of coronary artery bypass graft(s), unspecified, with other forms of angina pectoris: Secondary | ICD-10-CM | POA: Diagnosis not present

## 2024-02-15 DIAGNOSIS — I5032 Chronic diastolic (congestive) heart failure: Secondary | ICD-10-CM

## 2024-02-15 DIAGNOSIS — I10 Essential (primary) hypertension: Secondary | ICD-10-CM | POA: Diagnosis not present

## 2024-02-15 DIAGNOSIS — K219 Gastro-esophageal reflux disease without esophagitis: Secondary | ICD-10-CM | POA: Diagnosis not present

## 2024-02-15 DIAGNOSIS — E785 Hyperlipidemia, unspecified: Secondary | ICD-10-CM

## 2024-02-15 DIAGNOSIS — E782 Mixed hyperlipidemia: Secondary | ICD-10-CM | POA: Diagnosis not present

## 2024-02-15 DIAGNOSIS — I739 Peripheral vascular disease, unspecified: Secondary | ICD-10-CM | POA: Diagnosis not present

## 2024-02-15 DIAGNOSIS — E1169 Type 2 diabetes mellitus with other specified complication: Secondary | ICD-10-CM

## 2024-02-15 DIAGNOSIS — I252 Old myocardial infarction: Secondary | ICD-10-CM

## 2024-02-15 LAB — BAYER DCA HB A1C WAIVED: HB A1C (BAYER DCA - WAIVED): 7.1 % — ABNORMAL HIGH (ref 4.8–5.6)

## 2024-02-15 MED ORDER — ROSUVASTATIN CALCIUM 40 MG PO TABS: 40.0000 mg | ORAL_TABLET | Freq: Every day | ORAL | 0 refills | Status: AC

## 2024-02-15 MED ORDER — LOSARTAN POTASSIUM 50 MG PO TABS: 50.0000 mg | ORAL_TABLET | Freq: Every day | ORAL | 1 refills | Status: DC

## 2024-02-15 MED ORDER — OXYBUTYNIN CHLORIDE ER 5 MG PO TB24: 5.0000 mg | ORAL_TABLET | Freq: Every day | ORAL | 0 refills | Status: AC

## 2024-02-15 MED ORDER — OZEMPIC (2 MG/DOSE) 8 MG/3ML ~~LOC~~ SOPN: 2.0000 mg | PEN_INJECTOR | SUBCUTANEOUS | 0 refills | Status: AC

## 2024-02-15 MED ORDER — HUMALOG KWIKPEN 200 UNIT/ML ~~LOC~~ SOPN: 15.0000 [IU] | PEN_INJECTOR | Freq: Three times a day (TID) | SUBCUTANEOUS | 1 refills | Status: AC

## 2024-02-15 MED ORDER — TRESIBA FLEXTOUCH 100 UNIT/ML ~~LOC~~ SOPN: 20.0000 [IU] | PEN_INJECTOR | Freq: Every day | SUBCUTANEOUS | 2 refills | Status: DC

## 2024-02-15 NOTE — Progress Notes (Signed)
 "  Subjective:    Patient ID: Leah Ferrell, female    DOB: 05/21/1950, 73 y.o.   MRN: [id number]  Chief Complaint  Patient presents with   Medical Management of Chronic Issues   Pt presents to the office today for chronic follow up.   Pt has CHF, CAD and hx MI and is followed by Cardiologists every 6 months. Echo on 11/06/2023 with EF of 55-60 %. She is currently not taking her losartan  100 mg or Imdur  30 mg every day.   PT is followed by Vascular  for PVD and PAD. PT states these are stable.    Pt had a renal angiography on 03/16/19.    She had critical lower limb ischemia and had abdominal aortogram with lower extremity. 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    Pt is obese because her BMI is 34 and has DM and HTN.   Hypertension This is a chronic problem. The current episode started more than 1 year ago. The problem is unchanged. The problem is uncontrolled. Associated symptoms include peripheral edema (when staying too long). Pertinent negatives include no blurred vision, malaise/fatigue or shortness of breath. Risk factors for coronary artery disease include diabetes mellitus, dyslipidemia, obesity, sedentary lifestyle and post-menopausal state. The current treatment provides moderate improvement. Hypertensive end-organ damage includes PVD.  Diabetes She presents for her follow-up diabetic visit. She has type 2 diabetes mellitus. Associated symptoms include fatigue and foot paresthesias. Pertinent negatives for diabetes include no blurred vision. Symptoms are stable. Diabetic complications include peripheral neuropathy and PVD. Risk factors for coronary artery disease include diabetes mellitus, dyslipidemia, hypertension, sedentary lifestyle, post-menopausal and obesity. She is following a generally unhealthy diet. Her overall blood glucose range is 180-200 mg/dl. Eye exam is current.  Congestive Heart Failure Presents for follow-up visit. Associated symptoms include edema and fatigue. Pertinent  negatives include no shortness of breath. The symptoms have been stable.      Review of Systems  Constitutional:  Positive for fatigue. Negative for malaise/fatigue.  Eyes:  Negative for blurred vision.  Respiratory:  Negative for shortness of breath.   All other systems reviewed and are negative.  Family History  Problem Relation Age of Onset   Diabetes Mother    Hyperlipidemia Mother    Hypertension Mother    Peripheral vascular disease Mother    Other Sister        drowned   Other Sister        drowned   Diabetes Daughter        borderline   Hypertension Daughter    Diabetes Maternal Grandmother    Heart attack Maternal Grandfather    Heart disease Brother    Diabetes Brother    Alcohol  abuse Brother    Diabetes Son    Hypertension Son    Hyperlipidemia Son    Breast cancer Neg Hx    Social History   Socioeconomic History   Marital status: Widowed    Spouse name: Not on file   Number of children: 4   Years of education: completed school in [place]    Highest education level: Not on file  Occupational History   Occupation: retired  Tobacco Use   Smoking status: Former    Current packs/day: 0.00    Average packs/day: 1 pack/day for 36.8 years (36.8 ttl pk-yrs)    Types: Cigarettes    Start date: 04/21/1964    Quit date: 02/17/2001    Years since quitting: 23.0   Smokeless tobacco:  Never  Vaping Use   Vaping status: Never Used  Substance and Sexual Activity   Alcohol  use: No    Alcohol /week: 0.0 standard drinks of alcohol    Drug use: No   Sexual activity: Not Currently    Birth control/protection: Post-menopausal  Other Topics Concern   Not on file  Social History Narrative   Lives alone - one level - all of her children live far away   Social Drivers of Health   Tobacco Use: Medium Risk (02/15/2024)   Patient History    Smoking Tobacco Use: Former    Smokeless Tobacco Use: Never    Passive Exposure: Not on file  Financial Resource Strain: Medium  Risk (08/17/2023)   Overall Financial Resource Strain (CARDIA)    Difficulty of Paying Living Expenses: Somewhat hard  Food Insecurity: No Food Insecurity (12/28/2023)   Epic    Worried About Programme Researcher, Broadcasting/film/video in the Last Year: Never true    Ran Out of Food in the Last Year: Never true  Transportation Needs: No Transportation Needs (12/28/2023)   Epic    Lack of Transportation (Medical): No    Lack of Transportation (Non-Medical): No  Physical Activity: Insufficiently Active (12/28/2023)   Exercise Vital Sign    Days of Exercise per Week: 3 days    Minutes of Exercise per Session: 30 min  Stress: No Stress Concern Present (12/28/2023)   [employer] of Occupational Health - Occupational Stress Questionnaire    Feeling of Stress: Not at all  Social Connections: Socially Isolated (12/28/2023)   Social Connection and Isolation Panel    Frequency of Communication with Friends and Family: Twice a week    Frequency of Social Gatherings with Friends and Family: Twice a week    Attends Religious Services: Never    Database Administrator or Organizations: No    Attends Banker Meetings: Never    Marital Status: Widowed  Depression (PHQ2-9): Low Risk (02/15/2024)   Depression (PHQ2-9)    PHQ-2 Score: 0  Alcohol  Screen: Low Risk (12/25/2022)   Alcohol  Screen    Last Alcohol  Screening Score (AUDIT): 0  Housing: Low Risk (12/28/2023)   Epic    Unable to Pay for Housing in the Last Year: No    Number of Times Moved in the Last Year: 0    Homeless in the Last Year: No  Utilities: Not At Risk (12/28/2023)   Epic    Threatened with loss of utilities: No  Health Literacy: Adequate Health Literacy (12/28/2023)   B1300 Health Literacy    Frequency of need for help with medical instructions: Never       Objective:   Physical Exam Vitals reviewed.  Constitutional:      General: She is not in acute distress.    Appearance: She is well-developed. She is obese.  HENT:      Head: Normocephalic and atraumatic.     Right Ear: Tympanic membrane normal.     Left Ear: Tympanic membrane normal.  Eyes:     Pupils: Pupils are equal, round, and reactive to light.  Neck:     Thyroid : No thyromegaly.  Cardiovascular:     Rate and Rhythm: Normal rate and regular rhythm.     Heart sounds: Normal heart sounds. No murmur heard. Pulmonary:     Effort: Pulmonary effort is normal. No respiratory distress.     Breath sounds: Normal breath sounds. No wheezing.  Abdominal:     General: Bowel sounds  are normal. There is no distension.     Palpations: Abdomen is soft.     Tenderness: There is no abdominal tenderness.  Musculoskeletal:        General: No tenderness. Normal range of motion.     Cervical back: Normal range of motion and neck supple.     Right lower leg: Edema (trace) present.     Left lower leg: Edema present.  Skin:    General: Skin is warm and dry.  Neurological:     Mental Status: She is alert and oriented to person, place, and time.     Cranial Nerves: No cranial nerve deficit.     Deep Tendon Reflexes: Reflexes are normal and symmetric.  Psychiatric:        Behavior: Behavior normal.        Thought Content: Thought content normal.        Judgment: Judgment normal.       BP (!) 183/64   Pulse (!) 56   Temp (!) 97.4 F (36.3 C) (Temporal)   Ht 5' 2.5 (1.588 m)   Wt 191 lb 12.8 oz (87 kg)   SpO2 97%   BMI 34.52 kg/m      Assessment & Plan:   NEELEY SEDIVY comes in today with chief complaint of Medical Management of Chronic Issues   Diagnosis and orders addressed:  1. Diabetes mellitus with peripheral vascular disease (HCC) - insulin  degludec (TRESIBA  FLEXTOUCH) 100 UNIT/ML FlexTouch Pen; Inject 20 Units into the skin at bedtime.  Dispense: 9 mL; Refill: 2 - insulin  lispro (HUMALOG  KWIKPEN) 200 UNIT/ML KwikPen; Inject 15-30 Units into the skin 3 (three) times daily before meals.  Dispense: 9 mL; Refill: 1 - Semaglutide , 2 MG/DOSE,  (OZEMPIC , 2 MG/DOSE,) 8 MG/3ML SOPN; Inject 2 mg into the skin once a week.  Dispense: 3 mL; Refill: 0 - Bayer DCA Hb A1c Waived - CMP14+EGFR  2. Hyperlipidemia associated with type 2 diabetes mellitus (HCC) - rosuvastatin  (CRESTOR ) 40 MG tablet; Take 1 tablet (40 mg total) by mouth daily.  Dispense: 90 tablet; Refill: 0 - CMP14+EGFR  3. 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 Obesity (BMI 30.0-34.9) - CMP14+EGFR  5. PAD (peripheral artery disease) - CMP14+EGFR  6. S/P CABG x 3  - CMP14+EGFR  7. Mixed hyperlipidemia - CMP14+EGFR  8. Hx of non-ST elevation myocardial infarction (NSTEMI)  - CMP14+EGFR  9. Gastroesophageal reflux disease without esophagitis  - CMP14+EGFR  10. Essential hypertension  - CMP14+EGFR  11. Chronic diastolic heart failure (HCC) - CMP14+EGFR   Labs pending Continue current medications  Keep specialists follow up Long discussion about her BP medication. I will decrease her Losartan  to 50 mg and for her to take every day.  Health Maintenance reviewed Diet and exercise encouraged  Follow up plan: 1 month for HTN   Bari Learn, FNP  "

## 2024-02-15 NOTE — Patient Instructions (Signed)
 Hypertension, Adult High blood pressure (hypertension) is when the force of blood pumping through the arteries is too strong. The arteries are the blood vessels that carry blood from the heart throughout the body. Hypertension forces the heart to work harder to pump blood and may cause arteries to become narrow or stiff. Untreated or uncontrolled hypertension can lead to a heart attack, heart failure, a stroke, kidney disease, and other problems. A blood pressure reading consists of a higher number over a lower number. Ideally, your blood pressure should be below 120/80. The first ("top") number is called the systolic pressure. It is a measure of the pressure in your arteries as your heart beats. The second ("bottom") number is called the diastolic pressure. It is a measure of the pressure in your arteries as the heart relaxes. What are the causes? The exact cause of this condition is not known. There are some conditions that result in high blood pressure. What increases the risk? Certain factors may make you more likely to develop high blood pressure. Some of these risk factors are under your control, including: Smoking. Not getting enough exercise or physical activity. Being overweight. Having too much fat, sugar, calories, or salt (sodium) in your diet. Drinking too much alcohol. Other risk factors include: Having a personal history of heart disease, diabetes, high cholesterol, or kidney disease. Stress. Having a family history of high blood pressure and high cholesterol. Having obstructive sleep apnea. Age. The risk increases with age. What are the signs or symptoms? High blood pressure may not cause symptoms. Very high blood pressure (hypertensive crisis) may cause: Headache. Fast or irregular heartbeats (palpitations). Shortness of breath. Nosebleed. Nausea and vomiting. Vision changes. Severe chest pain, dizziness, and seizures. How is this diagnosed? This condition is diagnosed by  measuring your blood pressure while you are seated, with your arm resting on a flat surface, your legs uncrossed, and your feet flat on the floor. The cuff of the blood pressure monitor will be placed directly against the skin of your upper arm at the level of your heart. Blood pressure should be measured at least twice using the same arm. Certain conditions can cause a difference in blood pressure between your right and left arms. If you have a high blood pressure reading during one visit or you have normal blood pressure with other risk factors, you may be asked to: Return on a different day to have your blood pressure checked again. Monitor your blood pressure at home for 1 week or longer. If you are diagnosed with hypertension, you may have other blood or imaging tests to help your health care provider understand your overall risk for other conditions. How is this treated? This condition is treated by making healthy lifestyle changes, such as eating healthy foods, exercising more, and reducing your alcohol intake. You may be referred for counseling on a healthy diet and physical activity. Your health care provider may prescribe medicine if lifestyle changes are not enough to get your blood pressure under control and if: Your systolic blood pressure is above 130. Your diastolic blood pressure is above 80. Your personal target blood pressure may vary depending on your medical conditions, your age, and other factors. Follow these instructions at home: Eating and drinking  Eat a diet that is high in fiber and potassium, and low in sodium, added sugar, and fat. An example of this eating plan is called the DASH diet. DASH stands for Dietary Approaches to Stop Hypertension. To eat this way: Eat  plenty of fresh fruits and vegetables. Try to fill one half of your plate at each meal with fruits and vegetables. Eat whole grains, such as whole-wheat pasta, brown rice, or whole-grain bread. Fill about one  fourth of your plate with whole grains. Eat or drink low-fat dairy products, such as skim milk or low-fat yogurt. Avoid fatty cuts of meat, processed or cured meats, and poultry with skin. Fill about one fourth of your plate with lean proteins, such as fish, chicken without skin, beans, eggs, or tofu. Avoid pre-made and processed foods. These tend to be higher in sodium, added sugar, and fat. Reduce your daily sodium intake. Many people with hypertension should eat less than 1,500 mg of sodium a day. Do not drink alcohol if: Your health care provider tells you not to drink. You are pregnant, may be pregnant, or are planning to become pregnant. If you drink alcohol: Limit how much you have to: 0-1 drink a day for women. 0-2 drinks a day for men. Know how much alcohol is in your drink. In the [place] one drink equals one 12 oz bottle of beer (355 mL), one 5 oz glass of wine (148 mL), or one 1 oz glass of hard liquor (44 mL). Lifestyle  Work with your health care provider to maintain a healthy body weight or to lose weight. Ask what an ideal weight is for you. Get at least 30 minutes of exercise that causes your heart to beat faster (aerobic exercise) most days of the week. Activities may include walking, swimming, or biking. Include exercise to strengthen your muscles (resistance exercise), such as Pilates or lifting weights, as part of your weekly exercise routine. Try to do these types of exercises for 30 minutes at least 3 days a week. Do not use any products that contain nicotine or tobacco. These products include cigarettes, chewing tobacco, and vaping devices, such as e-cigarettes. If you need help quitting, ask your health care provider. Monitor your blood pressure at home as told by your health care provider. Keep all follow-up visits. This is important. Medicines Take over-the-counter and prescription medicines only as told by your health care provider. Follow directions carefully. Blood  pressure medicines must be taken as prescribed. Do not skip doses of blood pressure medicine. Doing this puts you at risk for problems and can make the medicine less effective. Ask your health care provider about side effects or reactions to medicines that you should watch for. Contact a health care provider if you: Think you are having a reaction to a medicine you are taking. Have headaches that keep coming back (recurring). Feel dizzy. Have swelling in your ankles. Have trouble with your vision. Get help right away if you: Develop a severe headache or confusion. Have unusual weakness or numbness. Feel faint. Have severe pain in your chest or abdomen. Vomit repeatedly. Have trouble breathing. These symptoms may be an emergency. Get help right away. Call 911. Do not wait to see if the symptoms will go away. Do not drive yourself to the hospital. Summary Hypertension is when the force of blood pumping through your arteries is too strong. If this condition is not controlled, it may put you at risk for serious complications. Your personal target blood pressure may vary depending on your medical conditions, your age, and other factors. For most people, a normal blood pressure is less than 120/80. Hypertension is treated with lifestyle changes, medicines, or a combination of both. Lifestyle changes include losing weight, eating a healthy,  low-sodium diet, exercising more, and limiting alcohol. This information is not intended to replace advice given to you by your health care provider. Make sure you discuss any questions you have with your health care provider. Document Revised: 12/11/2020 Document Reviewed: 12/11/2020 Elsevier Patient Education  2024 [employer].

## 2024-02-16 LAB — CMP14+EGFR
ALT: 17 IU/L (ref 0–32)
AST: 17 IU/L (ref 0–40)
Albumin: 4 g/dL (ref 3.8–4.8)
Alkaline Phosphatase: 59 IU/L (ref 49–135)
BUN/Creatinine Ratio: 16 (ref 12–28)
BUN: 12 mg/dL (ref 8–27)
Bilirubin Total: 0.2 mg/dL (ref 0.0–1.2)
CO2: 30 mmol/L — ABNORMAL HIGH (ref 20–29)
Calcium: 10 mg/dL (ref 8.7–10.3)
Chloride: 94 mmol/L — ABNORMAL LOW (ref 96–106)
Creatinine, Ser: 0.75 mg/dL (ref 0.57–1.00)
Globulin, Total: 3 g/dL (ref 1.5–4.5)
Glucose: 159 mg/dL — ABNORMAL HIGH (ref 70–99)
Potassium: 4.3 mmol/L (ref 3.5–5.2)
Sodium: 137 mmol/L (ref 134–144)
Total Protein: 7 g/dL (ref 6.0–8.5)
eGFR: 85 mL/min/1.73

## 2024-03-15 ENCOUNTER — Other Ambulatory Visit (HOSPITAL_COMMUNITY): Payer: Self-pay

## 2024-03-15 ENCOUNTER — Telehealth: Payer: Self-pay

## 2024-03-15 NOTE — Telephone Encounter (Signed)
 Pharmacy Patient Advocate Encounter   Received notification from [facility] that prior authorization for TRESIBA  is required/requested.   Insurance verification completed.   The patient is insured through [employer].   Per test claim: TOUJEO  & LANTUS  LYNFORD is preferred by the insurance.   Test claims: Lantus  Solostar 6ml, 30 day supply, $5.91 Toujeo  1.9ml, 22 day supply, $21.58   If suggested medication is appropriate, Please send in a new RX and discontinue this one. If not, please advise as to why it's not appropriate so that we may request a Prior Authorization. Please note, some preferred medications may still require a PA.  If the suggested medications have not been trialed and there are no contraindications to their use, the PA will not be submitted, as it will not be approved.

## 2024-03-17 ENCOUNTER — Ambulatory Visit: Admitting: Family

## 2024-03-17 MED ORDER — TOUJEO MAX SOLOSTAR 300 UNIT/ML ~~LOC~~ SOPN: 20.0000 [IU] | PEN_INJECTOR | Freq: Every evening | SUBCUTANEOUS | 2 refills | Status: AC | PRN

## 2024-03-17 NOTE — Telephone Encounter (Signed)
 Called and spoke with patient and made her aware

## 2024-03-17 NOTE — Telephone Encounter (Signed)
 Tresiba  changed to Toujeo , continue 20 units at bedtime per insurance.

## 2024-03-21 ENCOUNTER — Ambulatory Visit: Admitting: Family

## 2024-03-22 ENCOUNTER — Other Ambulatory Visit: Payer: Self-pay

## 2024-03-22 ENCOUNTER — Emergency Department (HOSPITAL_COMMUNITY)

## 2024-03-22 ENCOUNTER — Encounter (HOSPITAL_COMMUNITY): Payer: Self-pay

## 2024-03-22 ENCOUNTER — Emergency Department (HOSPITAL_COMMUNITY)
Admission: EM | Admit: 2024-03-22 | Discharge: 2024-03-22 | Disposition: A | Discharge status: Home / Self Care | Attending: Emergency Medicine | Admitting: Emergency Medicine

## 2024-03-22 DIAGNOSIS — Z79899 Other long term (current) drug therapy: Secondary | ICD-10-CM | POA: Insufficient documentation

## 2024-03-22 DIAGNOSIS — I2510 Atherosclerotic heart disease of native coronary artery without angina pectoris: Secondary | ICD-10-CM | POA: Insufficient documentation

## 2024-03-22 DIAGNOSIS — I110 Hypertensive heart disease with heart failure: Secondary | ICD-10-CM | POA: Insufficient documentation

## 2024-03-22 DIAGNOSIS — E119 Type 2 diabetes mellitus without complications: Secondary | ICD-10-CM | POA: Insufficient documentation

## 2024-03-22 DIAGNOSIS — I10 Essential (primary) hypertension: Secondary | ICD-10-CM

## 2024-03-22 DIAGNOSIS — Z7902 Long term (current) use of antithrombotics/antiplatelets: Secondary | ICD-10-CM | POA: Insufficient documentation

## 2024-03-22 DIAGNOSIS — Z7982 Long term (current) use of aspirin: Secondary | ICD-10-CM | POA: Insufficient documentation

## 2024-03-22 DIAGNOSIS — Z794 Long term (current) use of insulin: Secondary | ICD-10-CM | POA: Insufficient documentation

## 2024-03-22 DIAGNOSIS — I509 Heart failure, unspecified: Secondary | ICD-10-CM | POA: Insufficient documentation

## 2024-03-22 LAB — TROPONIN T, HIGH SENSITIVITY
Troponin T High Sensitivity: 20 ng/L — ABNORMAL HIGH (ref 0–19)
Troponin T High Sensitivity: 25 ng/L — ABNORMAL HIGH (ref 0–19)

## 2024-03-22 LAB — CBC
HCT: 41.4 % (ref 36.0–46.0)
Hemoglobin: 13.7 g/dL (ref 12.0–15.0)
MCH: 31.6 pg (ref 26.0–34.0)
MCHC: 33.1 g/dL (ref 30.0–36.0)
MCV: 95.6 fL (ref 80.0–100.0)
Platelets: 206 10*3/uL (ref 150–400)
RBC: 4.33 MIL/uL (ref 3.87–5.11)
RDW: 14.7 % (ref 11.5–15.5)
WBC: 9.5 10*3/uL (ref 4.0–10.5)
nRBC: 0 % (ref 0.0–0.2)

## 2024-03-22 LAB — BASIC METABOLIC PANEL WITH GFR
Anion gap: 12 (ref 5–15)
BUN: 12 mg/dL (ref 8–23)
CO2: 29 mmol/L (ref 22–32)
Calcium: 10.2 mg/dL (ref 8.9–10.3)
Chloride: 98 mmol/L (ref 98–111)
Creatinine, Ser: 0.66 mg/dL (ref 0.44–1.00)
GFR, Estimated: >60 mL/min
Glucose, Bld: 142 mg/dL — ABNORMAL HIGH (ref 70–99)
Potassium: 3.8 mmol/L (ref 3.5–5.1)
Sodium: 138 mmol/L (ref 135–145)

## 2024-03-22 LAB — CBG MONITORING, ED: Glucose-Capillary: 125 mg/dL — ABNORMAL HIGH (ref 70–99)

## 2024-03-22 MED ORDER — HYDRALAZINE HCL 20 MG/ML IJ SOLN
20.0000 mg | Freq: Once | INTRAMUSCULAR | Status: AC
  Administered 2024-03-22: 20 mg via INTRAVENOUS
  Filled 2024-03-22: qty 1

## 2024-03-22 MED ORDER — LOSARTAN POTASSIUM 100 MG PO TABS: 100.0000 mg | ORAL_TABLET | Freq: Every day | ORAL | 0 refills | Status: AC

## 2024-03-22 NOTE — ED Notes (Signed)
 Pt said she is now getting tightness to her left side of chest and a headache , she also can feel her heart beating and she never does. Chat sent to EDP .

## 2024-03-22 NOTE — ED Notes (Signed)
Daughter given update over the phone 

## 2024-03-22 NOTE — ED Notes (Signed)
 Nurse called CCMD.

## 2024-03-22 NOTE — ED Notes (Signed)
Pt can drink.

## 2024-03-22 NOTE — ED Notes (Signed)
 Pt requested CBG monitoring

## 2024-03-22 NOTE — Discharge Instructions (Addendum)
Increase your Losartan to 100 mg daily.

## 2024-03-22 NOTE — ED Triage Notes (Signed)
 [facility] for hypertension for a couple days. Pt has been taking medication that is prescribed but is not helping. Pt has a hx of MI and is complaining of being light headed.

## 2024-03-25 ENCOUNTER — Emergency Department (HOSPITAL_COMMUNITY)
Admission: EM | Admit: 2024-03-25 | Discharge: 2024-03-25 | Disposition: A | Discharge status: Home / Self Care | Source: Home / Self Care | Attending: Emergency Medicine | Admitting: Emergency Medicine

## 2024-03-25 ENCOUNTER — Other Ambulatory Visit: Payer: Self-pay

## 2024-03-25 ENCOUNTER — Ambulatory Visit: Payer: Self-pay

## 2024-03-25 ENCOUNTER — Encounter (HOSPITAL_COMMUNITY): Payer: Self-pay

## 2024-03-25 DIAGNOSIS — I10 Essential (primary) hypertension: Secondary | ICD-10-CM

## 2024-03-25 LAB — BASIC METABOLIC PANEL WITH GFR
Anion gap: 12 (ref 5–15)
BUN: 10 mg/dL (ref 8–23)
CO2: 27 mmol/L (ref 22–32)
Calcium: 10.3 mg/dL (ref 8.9–10.3)
Chloride: 96 mmol/L — ABNORMAL LOW (ref 98–111)
Creatinine, Ser: 0.71 mg/dL (ref 0.44–1.00)
GFR, Estimated: >60 mL/min
Glucose, Bld: 114 mg/dL — ABNORMAL HIGH (ref 70–99)
Potassium: 4.7 mmol/L (ref 3.5–5.1)
Sodium: 135 mmol/L (ref 135–145)

## 2024-03-25 LAB — CBC WITH DIFFERENTIAL/PLATELET
Abs Immature Granulocytes: 0.03 10*3/uL (ref 0.00–0.07)
Basophils Absolute: 0 10*3/uL (ref 0.0–0.1)
Basophils Relative: 0 %
Eosinophils Absolute: 0.1 10*3/uL (ref 0.0–0.5)
Eosinophils Relative: 1 %
HCT: 43.9 % (ref 36.0–46.0)
Hemoglobin: 14.5 g/dL (ref 12.0–15.0)
Immature Granulocytes: 0 %
Lymphocytes Relative: 29 %
Lymphs Abs: 2.5 10*3/uL (ref 0.7–4.0)
MCH: 30.9 pg (ref 26.0–34.0)
MCHC: 33 g/dL (ref 30.0–36.0)
MCV: 93.6 fL (ref 80.0–100.0)
Monocytes Absolute: 0.6 10*3/uL (ref 0.1–1.0)
Monocytes Relative: 7 %
Neutro Abs: 5.2 10*3/uL (ref 1.7–7.7)
Neutrophils Relative %: 63 %
Platelets: 208 10*3/uL (ref 150–400)
RBC: 4.69 MIL/uL (ref 3.87–5.11)
RDW: 14.6 % (ref 11.5–15.5)
WBC: 8.4 10*3/uL (ref 4.0–10.5)
nRBC: 0 % (ref 0.0–0.2)

## 2024-03-25 MED ORDER — HYDRALAZINE HCL 20 MG/ML IJ SOLN: 10.0000 mg | Freq: Once | INTRAMUSCULAR | Status: DC

## 2024-03-25 NOTE — Discharge Instructions (Signed)
 Increase your Coreg  medicine which is 25 mg, so you take 1-1/2 pills in the morning and 1 pill in the evening.  If your blood pressure stays high in the evening you can take another half a pill.  Follow back up with your family doctor

## 2024-03-25 NOTE — Telephone Encounter (Signed)
 FYI: noted

## 2024-03-25 NOTE — ED Triage Notes (Signed)
 Pt BIB ems for hypertension. Per pt this morning systolic was in the 160s. Pt was recently seen for same. Per EMS pt's BP readings are extremely different in both arms. EDP at bedside during triage.

## 2024-03-25 NOTE — ED Notes (Signed)
 Pt/family received d/c paperwork at this time. After going over the paperwork any questions, comments, or concerns were answered to the best of this nurse's knowledge. The pt/family verbally acknowledged the teachings/instructions.

## 2024-03-25 NOTE — Telephone Encounter (Signed)
 FYI Only or Action Required?: FYI only for provider: ED advised.  Patient was last seen in primary care on 02/15/2024 by Lavell Bari LABOR, FNP.  Called Nurse Triage reporting Hypertension.  Symptoms began today.  Interventions attempted: Prescription medications: reports taking three blood pressure medication.  Symptoms are: unchanged.  Triage Disposition: Go to ED Now (Notify PCP)  Patient/caregiver understands and will follow disposition?: Yes  Message from Graeme ORN sent at 03/25/2024 12:49 PM EST  Summary: Bp over 200 -   Reason for Triage: Bp over 200 - would like to know if she can take another pill - would like to speak with Bari - was seen at ED for same thing this week         Reason for Disposition  [1] Systolic BP >= 160 OR Diastolic >= 100 AND [2] cardiac (e.g., breathing difficulty, chest pain) or neurologic symptoms (e.g., new-onset blurred or double vision, unsteady gait)  Answer Assessment - Initial Assessment Questions EMS activated @12  56 EMS arrival @ 1322 Patient home alone, no transportation  1. BLOOD PRESSURE: What is your blood pressure? Did you take at least two measurements 5 minutes apart?     Unable to measure 2. ONSET: When did you take your blood pressure?     Reports that she started feeling bad about 1230 3. HOW: How did you take your blood pressure? (e.g., automatic home BP monitor, visiting nurse)     Automatic cuff, BP too high for cuff to resister 4. HISTORY: Do you have a history of high blood pressure?     History of HTN, seen in ED two days ago for high blood pressure 5. MEDICINES: Are you taking any medicines for blood pressure? Have you missed any doses recently?     Reports that she is taking three medications for blood pressure and that she has not missed any doses 6. OTHER SYMPTOMS: Do you have any symptoms? (e.g., blurred vision, chest pain, difficulty breathing, headache, weakness)     Reports feeling shaky and  legs feel weak.  Cold and shaky. Sounds mildy short of breath during call  O2 Sat 98% HR 63  Protocols used: Blood Pressure - High-A-AH

## 2024-04-07 ENCOUNTER — Ambulatory Visit: Admitting: Family

## 2024-12-29 ENCOUNTER — Ambulatory Visit
